# Patient Record
Sex: Male | Born: 1945 | Race: White | Hispanic: No | Marital: Married
Health system: Midwestern US, Community
[De-identification: ages and names within clinical notes are randomized; demographics above are authoritative.]

## PROBLEM LIST (undated history)

## (undated) DIAGNOSIS — R1031 Right lower quadrant pain: Secondary | ICD-10-CM

## (undated) DIAGNOSIS — Z8507 Personal history of malignant neoplasm of pancreas: Secondary | ICD-10-CM

## (undated) DIAGNOSIS — E291 Testicular hypofunction: Secondary | ICD-10-CM

## (undated) DIAGNOSIS — D120 Benign neoplasm of cecum: Secondary | ICD-10-CM

## (undated) DIAGNOSIS — J9601 Acute respiratory failure with hypoxia: Secondary | ICD-10-CM

## (undated) DIAGNOSIS — N201 Calculus of ureter: Secondary | ICD-10-CM

## (undated) DIAGNOSIS — N200 Calculus of kidney: Secondary | ICD-10-CM

## (undated) DIAGNOSIS — R7989 Other specified abnormal findings of blood chemistry: Secondary | ICD-10-CM

## (undated) DIAGNOSIS — N390 Urinary tract infection, site not specified: Secondary | ICD-10-CM

## (undated) DIAGNOSIS — R300 Dysuria: Secondary | ICD-10-CM

## (undated) DIAGNOSIS — R600 Localized edema: Secondary | ICD-10-CM

## (undated) DIAGNOSIS — M47812 Spondylosis without myelopathy or radiculopathy, cervical region: Secondary | ICD-10-CM

## (undated) DIAGNOSIS — R509 Fever, unspecified: Secondary | ICD-10-CM

## (undated) DIAGNOSIS — M5090 Cervical disc disorder, unspecified, unspecified cervical region: Secondary | ICD-10-CM

## (undated) DIAGNOSIS — R002 Palpitations: Secondary | ICD-10-CM

## (undated) DIAGNOSIS — C259 Malignant neoplasm of pancreas, unspecified: Secondary | ICD-10-CM

## (undated) DIAGNOSIS — E782 Mixed hyperlipidemia: Secondary | ICD-10-CM

## (undated) DIAGNOSIS — I2510 Atherosclerotic heart disease of native coronary artery without angina pectoris: Secondary | ICD-10-CM

## (undated) DIAGNOSIS — G629 Polyneuropathy, unspecified: Secondary | ICD-10-CM

## (undated) DIAGNOSIS — I739 Peripheral vascular disease, unspecified: Secondary | ICD-10-CM

## (undated) DIAGNOSIS — I10 Essential (primary) hypertension: Secondary | ICD-10-CM

## (undated) DIAGNOSIS — I779 Disorder of arteries and arterioles, unspecified: Secondary | ICD-10-CM

## (undated) DIAGNOSIS — M1990 Unspecified osteoarthritis, unspecified site: Secondary | ICD-10-CM

## (undated) DIAGNOSIS — J384 Edema of larynx: Secondary | ICD-10-CM

## (undated) DIAGNOSIS — M542 Cervicalgia: Secondary | ICD-10-CM

## (undated) DIAGNOSIS — K429 Umbilical hernia without obstruction or gangrene: Secondary | ICD-10-CM

## (undated) DIAGNOSIS — K7689 Other specified diseases of liver: Secondary | ICD-10-CM

## (undated) DIAGNOSIS — M79606 Pain in leg, unspecified: Secondary | ICD-10-CM

## (undated) DIAGNOSIS — C257 Malignant neoplasm of other parts of pancreas: Secondary | ICD-10-CM

## (undated) DIAGNOSIS — R109 Unspecified abdominal pain: Secondary | ICD-10-CM

## (undated) DIAGNOSIS — Z1231 Encounter for screening mammogram for malignant neoplasm of breast: Secondary | ICD-10-CM

## (undated) HISTORY — PX: BACK SURGERY: SHX140

## (undated) HISTORY — DX: Peripheral vascular disease, unspecified: I73.9

## (undated) HISTORY — DX: Malignant neoplasm of pancreas, unspecified: C25.9

## (undated) HISTORY — DX: Cervical disc disorder, unspecified, unspecified cervical region: M50.90

## (undated) HISTORY — PX: CERVICAL DISC SURGERY: SHX588

## (undated) HISTORY — DX: Palpitations: R00.2

## (undated) HISTORY — DX: Mixed hyperlipidemia: E78.2

## (undated) HISTORY — DX: Atherosclerotic heart disease of native coronary artery without angina pectoris: I25.10

## (undated) HISTORY — DX: Essential (primary) hypertension: I10

## (undated) HISTORY — DX: Disorder of arteries and arterioles, unspecified: I77.9

## (undated) HISTORY — DX: Unspecified osteoarthritis, unspecified site: M19.90

## (undated) HISTORY — DX: Polyneuropathy, unspecified: G62.9

---

## 1993-02-25 HISTORY — PX: CORONARY ARTERY BYPASS GRAFT: SHX141

## 1998-01-11 ENCOUNTER — Inpatient Hospital Stay (HOSPITAL_COMMUNITY): Admission: AD | Admit: 1998-01-11 | Discharge: 1998-01-11 | Payer: Self-pay | Admitting: Cardiology

## 1999-06-21 ENCOUNTER — Encounter: Payer: Self-pay | Admitting: *Deleted

## 1999-06-21 ENCOUNTER — Ambulatory Visit (HOSPITAL_COMMUNITY): Admission: RE | Admit: 1999-06-21 | Discharge: 1999-06-21 | Payer: Self-pay | Admitting: *Deleted

## 2001-03-13 ENCOUNTER — Ambulatory Visit (HOSPITAL_COMMUNITY): Admission: RE | Admit: 2001-03-13 | Discharge: 2001-03-13 | Payer: Self-pay | Admitting: Neurosurgery

## 2003-07-20 ENCOUNTER — Ambulatory Visit (HOSPITAL_COMMUNITY): Admission: RE | Admit: 2003-07-20 | Discharge: 2003-07-20 | Payer: Self-pay | Admitting: Neurosurgery

## 2004-01-13 ENCOUNTER — Ambulatory Visit: Payer: Self-pay | Admitting: Cardiology

## 2004-01-16 ENCOUNTER — Ambulatory Visit: Payer: Self-pay | Admitting: Cardiology

## 2004-01-23 ENCOUNTER — Ambulatory Visit: Payer: Self-pay | Admitting: Cardiology

## 2004-03-05 ENCOUNTER — Inpatient Hospital Stay (HOSPITAL_COMMUNITY): Admission: RE | Admit: 2004-03-05 | Discharge: 2004-03-14 | Payer: Self-pay | Admitting: Neurosurgery

## 2004-03-05 ENCOUNTER — Ambulatory Visit: Payer: Self-pay | Admitting: Internal Medicine

## 2004-08-31 ENCOUNTER — Inpatient Hospital Stay (HOSPITAL_COMMUNITY): Admission: AD | Admit: 2004-08-31 | Discharge: 2004-09-03 | Payer: Self-pay | Admitting: Cardiology

## 2004-08-31 ENCOUNTER — Encounter: Payer: Self-pay | Admitting: Cardiology

## 2004-08-31 ENCOUNTER — Ambulatory Visit: Payer: Self-pay | Admitting: Internal Medicine

## 2004-09-01 ENCOUNTER — Ambulatory Visit: Payer: Self-pay | Admitting: Cardiology

## 2004-09-03 ENCOUNTER — Encounter: Payer: Self-pay | Admitting: Cardiology

## 2004-09-13 ENCOUNTER — Ambulatory Visit: Payer: Self-pay | Admitting: Cardiology

## 2004-11-15 ENCOUNTER — Ambulatory Visit: Payer: Self-pay | Admitting: Cardiology

## 2005-05-20 ENCOUNTER — Ambulatory Visit: Payer: Self-pay | Admitting: Cardiology

## 2005-12-10 ENCOUNTER — Encounter: Payer: Self-pay | Admitting: Cardiology

## 2006-01-31 ENCOUNTER — Ambulatory Visit: Payer: Self-pay | Admitting: Cardiology

## 2006-03-14 ENCOUNTER — Encounter: Payer: Self-pay | Admitting: Cardiology

## 2007-07-14 ENCOUNTER — Encounter: Payer: Self-pay | Admitting: Cardiology

## 2007-07-23 ENCOUNTER — Ambulatory Visit (HOSPITAL_COMMUNITY): Admission: RE | Admit: 2007-07-23 | Discharge: 2007-07-23 | Payer: Self-pay | Admitting: Ophthalmology

## 2008-02-08 ENCOUNTER — Encounter: Payer: Self-pay | Admitting: Cardiology

## 2008-07-29 ENCOUNTER — Ambulatory Visit (HOSPITAL_COMMUNITY): Admission: RE | Admit: 2008-07-29 | Discharge: 2008-07-29 | Payer: Self-pay | Admitting: Neurosurgery

## 2008-08-12 ENCOUNTER — Telehealth: Payer: Self-pay | Admitting: Cardiology

## 2008-08-19 ENCOUNTER — Ambulatory Visit: Payer: Self-pay | Admitting: Cardiology

## 2008-08-22 ENCOUNTER — Encounter: Payer: Self-pay | Admitting: Physician Assistant

## 2008-08-22 ENCOUNTER — Encounter: Payer: Self-pay | Admitting: Cardiology

## 2008-08-22 ENCOUNTER — Ambulatory Visit: Payer: Self-pay | Admitting: Cardiology

## 2008-10-13 ENCOUNTER — Encounter: Payer: Self-pay | Admitting: Physician Assistant

## 2008-10-13 ENCOUNTER — Ambulatory Visit: Payer: Self-pay | Admitting: Cardiology

## 2008-11-08 ENCOUNTER — Encounter: Payer: Self-pay | Admitting: Cardiology

## 2008-11-24 ENCOUNTER — Encounter: Payer: Self-pay | Admitting: Cardiology

## 2008-11-25 ENCOUNTER — Ambulatory Visit: Payer: Self-pay | Admitting: Cardiology

## 2009-03-21 ENCOUNTER — Ambulatory Visit (HOSPITAL_COMMUNITY): Admission: RE | Admit: 2009-03-21 | Discharge: 2009-03-21 | Payer: Self-pay | Admitting: Neurosurgery

## 2009-03-30 ENCOUNTER — Ambulatory Visit (HOSPITAL_COMMUNITY): Admission: RE | Admit: 2009-03-30 | Discharge: 2009-03-30 | Payer: Self-pay | Admitting: Neurosurgery

## 2009-04-24 ENCOUNTER — Telehealth (INDEPENDENT_AMBULATORY_CARE_PROVIDER_SITE_OTHER): Payer: Self-pay | Admitting: *Deleted

## 2009-04-27 ENCOUNTER — Telehealth (INDEPENDENT_AMBULATORY_CARE_PROVIDER_SITE_OTHER): Payer: Self-pay | Admitting: *Deleted

## 2009-05-08 ENCOUNTER — Ambulatory Visit: Payer: Self-pay | Admitting: Cardiology

## 2009-06-08 ENCOUNTER — Encounter: Payer: Self-pay | Admitting: Cardiology

## 2009-07-06 ENCOUNTER — Encounter: Payer: Self-pay | Admitting: Cardiology

## 2009-07-12 ENCOUNTER — Encounter: Payer: Self-pay | Admitting: Cardiology

## 2009-07-12 ENCOUNTER — Inpatient Hospital Stay (HOSPITAL_COMMUNITY): Admission: RE | Admit: 2009-07-12 | Discharge: 2009-07-13 | Payer: Self-pay | Admitting: Neurosurgery

## 2009-07-13 ENCOUNTER — Encounter: Payer: Self-pay | Admitting: Cardiology

## 2009-07-27 ENCOUNTER — Ambulatory Visit: Payer: Self-pay | Admitting: Cardiology

## 2009-12-12 ENCOUNTER — Telehealth (INDEPENDENT_AMBULATORY_CARE_PROVIDER_SITE_OTHER): Payer: Self-pay | Admitting: *Deleted

## 2010-03-18 ENCOUNTER — Encounter: Payer: Self-pay | Admitting: Neurosurgery

## 2010-03-27 NOTE — Op Note (Signed)
Summary: Operative Report  Operative Report   Imported By: Dorise Hiss 07/27/2009 12:21:25  _____________________________________________________________________  External Attachment:    Type:   Image     Comment:   External Document

## 2010-03-27 NOTE — Assessment & Plan Note (Signed)
Summary: PT REQUEST APPT-BURNING IN CHEST   Visit Type:  chest pain Primary Provider:  Regina Eck, MD  CC:  chest pain.  History of Present Illness:  The patient is an extremely pleasant  gentleman with a past  medical history of coronary disease, hypertension, hyperlipidemia who presents for evaluation of chest pain. I saw him last in June 2010 for a preoperative evaluation. The  patient's cardiac history dates back to 1995, when he underwent coronary   artery bypassing graft.  He did undergo a repeat catheterization in July  2006.  At that time, he was found to have severe native 3-vessel  coronary disease.  However, all of his grafts were patent.  When we saw him in June of 2010 we did schedule a Myoview. This was performed on June 28 of 2010. Ejection fraction was 52%. There was a question of very mild ischemia in the inferior basal wall. He was treated medically. He also had carotid Dopplers performed at that time that showed less than 50% bilateral stenosis. He did see one of our physician's assistants secondary to atypical chest pain and Imdur was added. He now returns with more complaints of chest pain. The patient states he has had these pains intermittently for 6 months. They do not radiate. They are described as a fire-like sensation in the left chest area. They are unpredictable and last one to 2 minutes and resolve spontaneously. There is no associated nausea, shortness of breath or diaphoresis. It is nonexertional, pleuritic, positional or related to food. It is unlike his previous cardiac pain. Note he has no exertional chest pain. He does have some dyspnea on exertion with more extreme activities which is a chronic issue.  Preventive Screening-Counseling & Management  Alcohol-Tobacco     Smoking Status: never  Current Medications (verified): 1)  Plavix 75 Mg Tabs (Clopidogrel Bisulfate) .... Take One Tablet By Mouth Daily 2)  Amitriptyline Hcl 25 Mg Tabs (Amitriptyline Hcl)  .... Take 2 Tab By Mouth At Bedtime 3)  Neurontin 600 Mg Tabs (Gabapentin) .... 4 Times A Day 4)  Lotrel 10-20 Mg Caps (Amlodipine Besy-Benazepril Hcl) .... Take 1 Capsule By Mouth Once A Day 5)  Lipitor 10 Mg Tabs (Atorvastatin Calcium) .... Take One Tablet By Mouth Daily. 6)  Aspir-Trin 325 Mg Tbec (Aspirin) .... Take 1 Tablet By Mouth Once A Day 7)  Vitamin D3 400 Unit Tabs (Cholecalciferol) .... Once Daily 8)  Isosorbide Mononitrate Cr 30 Mg Xr24h-Tab (Isosorbide Mononitrate) .... Take One Tablet By Mouth Daily 9)  Nitroglycerin 0.4 Mg Subl (Nitroglycerin) .... One Tablet Under Tongue Every 5 Minutes As Needed For Chest Pain---May Repeat Times Three 10)  Allegra 180 Mg Tabs (Fexofenadine Hcl) .... Take 1 Tablet By Mouth Once A Day 11)  Metoprolol Tartrate 25 Mg Tabs (Metoprolol Tartrate) .... Take 1 Tablet By Mouth Once A Day 12)  Meloxicam 15 Mg Tabs (Meloxicam) .... Take 1 Tablet By Mouth Once A Day  Allergies (verified): 1)  ! Demerol 2)  ! Compazine 3)  ! Reglan 4)  ! * Vistaid 5)  ! Codeine 6)  ! * Anesthia  Comments:  Nurse/Medical Assistant: The patient's medications and allergies were reviewed with the patient and were updated in the Medication and Allergy Lists. List reviewed.  Past History:  Past Medical History: HYPERTENSION, UNSPECIFIED (ICD-401.9) HYPERLIPIDEMIA-MIXED (ICD-272.4) carotid artery disease... Doppler..June.. 2010.Marland Kitchen less than 50% bilateral ICA stenosis  CABG IN 1995 [facility]) CAD...catheterization July, 2006 patent grafts possible ischemia distal circumflex not  amenable to PCI / ..nuclear study.Marland Kitchen.June.. 2010.Marland Kitchen possible mild inferobasal ischemia... low risk.Marland Kitchen / EF 52%...nuclear scan..June..  2010.  Past Surgical History: Reviewed history from 10/11/2008 and no changes required. Back Surgery CABG  Social History: Reviewed history from 10/11/2008 and no changes required. Married  Tobacco Use - No.  Alcohol Use - no  Review of Systems       Some  problems with neck pain from a disc issue but no fevers or chills, productive cough, hemoptysis, dysphasia, odynophagia, melena, hematochezia, dysuria, hematuria, rash, seizure activity, orthopnea, PND, pedal edema, claudication. Remaining systems are negative.   Vital Signs:  Patient profile:   65 year old male Height:      68 inches Weight:      175 pounds Pulse rate:   82 / minute BP sitting:   123 / 71  (left arm) Cuff size:   regular  Vitals Entered By: Carlye Grippe (May 08, 2009 10:37 AM) CC: chest pain   Physical Exam  General:  Well-developed well-nourished in no acute distress.  Skin is warm and dry.  HEENT is normal.  Neck is supple. No thyromegaly.  Chest is clear to auscultation with normal expansion.  Cardiovascular exam is regular rate and rhythm.  Abdominal exam nontender or distended. No masses palpated. Extremities show no edema. neuro grossly intact    EKG  Procedure date:  05/08/2009  Findings:      Normal sinus rhythm at a rate of 75. Left axis deviation. No significant ST changes.  Impression & Recommendations:  Problem # 1:  CHEST PAIN (ICD-786.50) His symptoms are atypical and his electrocardiogram shows no ST changes. They do not sound like cardiac pain. His recent Myoview was low risk. I do not think we need to pursue cardiac catheterization at this point. We will continue with medical therapy. I wonder if there may be a GI component. However it is not classic for this either. I have asked him to take either Pepcid or Zantac over-the-counter to see if this improves his symptoms. We will see him back in 3 months to review. His updated medication list for this problem includes:    Plavix 75 Mg Tabs (Clopidogrel bisulfate) .Marland Kitchen... Take one tablet by mouth daily    Lotrel 10-20 Mg Caps (Amlodipine besy-benazepril hcl) .Marland Kitchen... Take 1 capsule by mouth once a day    Aspir-trin 325 Mg Tbec (Aspirin) .Marland Kitchen... Take 1 tablet by mouth once a day    Isosorbide  Mononitrate Cr 30 Mg Xr24h-tab (Isosorbide mononitrate) .Marland Kitchen... Take one tablet by mouth daily    Nitroglycerin 0.4 Mg Subl (Nitroglycerin) ..... One tablet under tongue every 5 minutes as needed for chest pain---may repeat times three    Metoprolol Tartrate 25 Mg Tabs (Metoprolol tartrate) .Marland Kitchen... Take 1 tablet by mouth once a day  Problem # 2:  CORONARY ARTERY BYPASS GRAFT, HX OF (ICD-V45.81) Continue aspirin, beta blocker, statin. Orders: EKG w/ Interpretation (93000)  Problem # 3:  CAROTID ARTERY DISEASE (ICD-433.10) Recent carotid Dopplers showed less than 50% bilateral stenosis. Continue aspirin and statin. His updated medication list for this problem includes:    Plavix 75 Mg Tabs (Clopidogrel bisulfate) .Marland Kitchen... Take one tablet by mouth daily    Aspir-trin 325 Mg Tbec (Aspirin) .Marland Kitchen... Take 1 tablet by mouth once a day  Problem # 4:  HYPERTENSION, UNSPECIFIED (ICD-401.9) Blood pressure controlled on present medications. We'll continue. His updated medication list for this problem includes:    Lotrel 10-20 Mg Caps (Amlodipine besy-benazepril  hcl) ..... Take 1 capsule by mouth once a day    Aspir-trin 325 Mg Tbec (Aspirin) .Marland Kitchen... Take 1 tablet by mouth once a day    Metoprolol Tartrate 25 Mg Tabs (Metoprolol tartrate) .Marland Kitchen... Take 1 tablet by mouth once a day  Problem # 5:  HYPERLIPIDEMIA-MIXED (ICD-272.4) Continue statin. Lipids and liver monitored by his primary care. His updated medication list for this problem includes:    Lipitor 10 Mg Tabs (Atorvastatin calcium) .Marland Kitchen... Take one tablet by mouth daily.  Patient Instructions: 1)  Your physician wants you to follow-up in: 3 months. You will receive a reminder letter in the mail one-two months in advance. If you don't receive a letter, please call our office to schedule the follow-up appointment. 2)  Your physician recommends that you continue on your current medications as directed. Please refer to the Current Medication list given to you  today.

## 2010-03-27 NOTE — Progress Notes (Signed)
Summary: want appt for chest discomfort  Phone Note Call from Patient Call back at [phone]   Caller: Patient Call For: NURSE Summary of Call: SAW DR. Linna Darner ON TUESDAY WAS INFORMED THAT EKG NORMAL,THAT HE HAD A MUSCLE RELATED PROBLEM. PATIENT CALLED WANTED TO SEE KATZ FOR ANOTHER OPINION. Patient given first available appt March 14th @10 :15 with Dr. Jens Som. Nurse informed patient to go to ED if symptoms get worse. Patient verbalized understanding of plan.   Initial call taken by: Carlye Grippe,  April 27, 2009 4:26 PM

## 2010-03-27 NOTE — Letter (Signed)
Summary: Letter/ FAXED [facility]  Letter/ FAXED [facility]   Imported By: Dorise Hiss 06/19/2009 10:46:25  _____________________________________________________________________  External Attachment:    Type:   Image     Comment:   External Document

## 2010-03-27 NOTE — Progress Notes (Signed)
Summary: Burning chest pain  Phone Note Call from Patient Call back at [phone]   Caller: Patient Summary of Call: Pt called the office stating he is having a burning sensation aruond his heart. He states it feels like someone is holding a match to his chest near his sternum. He states he has been feeling this for about 5 weeks now. He sates this happens several times per day and is more frequent now than in the past. He states he saw Dr. Linna Darner this am. He states Dr. Linna Darner felt this is a muscle but he's not sure. He states this pain can occur with rest or exertion. When he was seen by Gene in October, the patient mention a pain that felt like being stuck with a hot poker. He states this pain is similar to the pain he felt then. He states he is taking meds as instructed. He is aware to go to ER for worsening pain or further concerns before we are able to return his call. We will discuss with MD and notify pt of his suggestions. Initial call taken by: Cyril Loosen, RN, BSN,  April 24, 2009 5:14 PM

## 2010-03-27 NOTE — Progress Notes (Signed)
Summary: refill request  Phone Note Call from Patient Call back at Home Phone [phone] Call back at [phone]   Caller: patie walk in Reason for Call: Refill Medication Summary of Call: metrapolol - patient came in [facility] in [place] has not received refill request.   Initial call taken by: Claudette Laws,  December 12, 2009 1:41 PM  Follow-up for Phone Call        called [facility] and verified rx received and staff stated rx is ready for pick up. patient informed. Follow-up by: Carlye Grippe,  December 12, 2009 2:29 PM

## 2010-03-27 NOTE — Letter (Signed)
Summary: Appointment- Rescheduled  [facility] [address]   [place], [place] [address]   Phone: [phone]  Fax: [phone]     Jul 13, 2009 MRN: [id number]     [facility] [address] [place], [place]  [address]     Dear Brett Mercado,   Due to a change in our office schedule, your appointment on  August 02, 2009 at 9:30 am  must be changed.    Your new appointment will be July 27, 2009 at 2:00pm  We look forward to participating in your health care needs.     Sincerely,  Glass blower/designer

## 2010-03-27 NOTE — Assessment & Plan Note (Signed)
Summary: 6 month fu -recv reminder   Visit Type:  Follow-up Primary Provider:  Regina Eck, MD  CC:  CAD.  History of Present Illness: The patient is seen for followup of coronary disease.  He has been quite stable.  He underwent cervical this surgery and has done very well.  He's not had any significant chest pain.  He had some mild difficulties in March, 2011.  He was seen in our office by Dr.Crenshaw who felt he was stable.  He's had no difficulty since.  Preventive Screening-Counseling & Management  Alcohol-Tobacco     Smoking Status: never  Current Medications (verified): 1)  Plavix 75 Mg Tabs (Clopidogrel Bisulfate) .... Take One Tablet By Mouth Daily 2)  Amitriptyline Hcl 25 Mg Tabs (Amitriptyline Hcl) .... Take 2 Tab By Mouth At Bedtime 3)  Neurontin 600 Mg Tabs (Gabapentin) .... 4 Times A Day 4)  Lotrel 10-20 Mg Caps (Amlodipine Besy-Benazepril Hcl) .... Take 1 Capsule By Mouth Once A Day 5)  Lipitor 10 Mg Tabs (Atorvastatin Calcium) .... Take One Tablet By Mouth Daily. 6)  Aspir-Trin 325 Mg Tbec (Aspirin) .... Take 1 Tablet By Mouth Once A Day 7)  Vitamin D3 400 Unit Tabs (Cholecalciferol) .... Once Daily 8)  Isosorbide Mononitrate Cr 30 Mg Xr24h-Tab (Isosorbide Mononitrate) .... Take One Tablet By Mouth Daily 9)  Nitroglycerin 0.4 Mg Subl (Nitroglycerin) .... One Tablet Under Tongue Every 5 Minutes As Needed For Chest Pain---May Repeat Times Three 10)  Allegra 180 Mg Tabs (Fexofenadine Hcl) .... Take 1 Tablet By Mouth Once A Day 11)  Metoprolol Tartrate 25 Mg Tabs (Metoprolol Tartrate) .... Take 1 Tablet By Mouth Once A Day 12)  Meloxicam 15 Mg Tabs (Meloxicam) .... Take 1 Tablet By Mouth Once A Day  Allergies (verified): 1)  ! Demerol 2)  ! Compazine 3)  ! Reglan 4)  ! * Vistaid 5)  ! Codeine 6)  ! * Anesthia  Comments:  Nurse/Medical Assistant: The patient is currently on medications but does not know the name or dosage at this time. Instructed to contact our  office with details. Will update medication list at that time.  Past History:  Past Medical History: Last updated: 05/08/2009 HYPERTENSION, UNSPECIFIED (ICD-401.9) HYPERLIPIDEMIA-MIXED (ICD-272.4) carotid artery disease... Doppler..June.. 2010.Marland Kitchen less than 50% bilateral ICA stenosis  CABG IN 1995 [facility]) CAD...catheterization July, 2006 patent grafts possible ischemia distal circumflex not amenable to PCI / ..nuclear study.Marland Kitchen.June.. 2010.Marland Kitchen possible mild inferobasal ischemia... low risk.Marland Kitchen / EF 52%...nuclear scan..June..  2010.  Review of Systems       Patient denies fever, chills, headache, sweats, rash, change in vision, change in hearing, chest pain, cough, nausea vomiting, urinary symptoms.  All other systems are reviewed and are negative and  Vital Signs:  Patient profile:   65 year old male Height:      68 inches Weight:      171 pounds BMI:     26.09 Pulse rate:   73 / minute BP sitting:   147 / 93  (left arm) Cuff size:   regular  Vitals Entered By: Carlye Grippe (July 27, 2009 2:03 PM)  Nutrition Counseling: Patient's BMI is greater than 25 and therefore counseled on weight management options.  Physical Exam  General:  patient is stable. Eyes:  no xanthelasma. Neck:  he is wearing a neck brace and is stable. Lungs:  lungs are clear respiratory effort is not labored. Heart:  cardiac exam reveals S1-S2.  No clicks or significant murmurs. Abdomen:  abdomen is soft. Extremities:  no peripheral edema. Psych:  patient is oriented to person time and place.  Affect is normal.   Impression & Recommendations:  Problem # 1:  CAROTID ARTERY DISEASE (ICD-433.10)  His updated medication list for this problem includes:    Plavix 75 Mg Tabs (Clopidogrel bisulfate) .Marland Kitchen... Take one tablet by mouth daily    Aspir-trin 325 Mg Tbec (Aspirin) .Marland Kitchen... Take 1 tablet by mouth once a day Carotid disease is mild.  No further workup now.  Problem # 2:  CAD, NATIVE VESSEL (ICD-414.01)  His  updated medication list for this problem includes:    Plavix 75 Mg Tabs (Clopidogrel bisulfate) .Marland Kitchen... Take one tablet by mouth daily    Lotrel 10-20 Mg Caps (Amlodipine besy-benazepril hcl) .Marland Kitchen... Take 1 capsule by mouth once a day    Aspir-trin 325 Mg Tbec (Aspirin) .Marland Kitchen... Take 1 tablet by mouth once a day    Isosorbide Mononitrate Cr 30 Mg Xr24h-tab (Isosorbide mononitrate) .Marland Kitchen... Take one tablet by mouth daily    Nitroglycerin 0.4 Mg Subl (Nitroglycerin) ..... One tablet under tongue every 5 minutes as needed for chest pain---may repeat times three    Metoprolol Tartrate 25 Mg Tabs (Metoprolol tartrate) .Marland Kitchen... Take 1 tablet by mouth once a day Coronary disease is stable.  No change in therapy.  Problem # 3:  HYPERTENSION, UNSPECIFIED (ICD-401.9)  His updated medication list for this problem includes:    Lotrel 10-20 Mg Caps (Amlodipine besy-benazepril hcl) .Marland Kitchen... Take 1 capsule by mouth once a day    Aspir-trin 325 Mg Tbec (Aspirin) .Marland Kitchen... Take 1 tablet by mouth once a day    Metoprolol Tartrate 25 Mg Tabs (Metoprolol tartrate) .Marland Kitchen... Take 1 tablet by mouth once a day Blood pressure slightly elevated today.  He'll need followup blood pressure checks.  Patient Instructions: 1)  Your physician wants you to follow-up in: 1 year. You will receive a reminder letter in the mail one-two months in advance. If you don't receive a letter, please call our office to schedule the follow-up appointment. 2)  Your physician recommends that you continue on your current medications as directed. Please refer to the Current Medication list given to you today. Prescriptions: ISOSORBIDE MONONITRATE CR 30 MG XR24H-TAB (ISOSORBIDE MONONITRATE) Take one tablet by mouth daily  #90 x 3   Entered by:   Cyril Loosen, RN, BSN   Authorized by:   Talitha Givens, MD, [facility]   Signed by:   Cyril Loosen, RN, BSN on 07/27/2009   Method used:   Electronically to        [facility] (retail)       [address]       [place], [place]  [address]       Ph: [phone]       Fax: [phone]   RxID:   [id number]

## 2010-05-14 LAB — PROTIME-INR: INR: 1.07 (ref 0.00–1.49)

## 2010-05-14 LAB — APTT: aPTT: 34 seconds (ref 24–37)

## 2010-05-15 LAB — SURGICAL PCR SCREEN: Staphylococcus aureus: NEGATIVE

## 2010-05-15 LAB — CBC
HCT: 33.8 % — ABNORMAL LOW (ref 39.0–52.0)
Hemoglobin: 11.4 g/dL — ABNORMAL LOW (ref 13.0–17.0)
MCV: 77.2 fL — ABNORMAL LOW (ref 78.0–100.0)
RBC: 4.38 MIL/uL (ref 4.22–5.81)
WBC: 6.7 10*3/uL (ref 4.0–10.5)

## 2010-05-15 LAB — BASIC METABOLIC PANEL
GFR calc Af Amer: >60 mL/min (ref >60)
GFR calc non Af Amer: >60 mL/min (ref >60)
Potassium: 5.1 mEq/L (ref 3.5–5.1)
Sodium: 138 mEq/L (ref 135–145)

## 2010-06-04 DIAGNOSIS — I2510 Atherosclerotic heart disease of native coronary artery without angina pectoris: Secondary | ICD-10-CM

## 2010-07-10 NOTE — Assessment & Plan Note (Signed)
[facility]                          [facility] OFFICE NOTE   Brett Mercado, Brett Mercado                      MRN:          [id number]  DATE:08/19/2008                            DOB:          05/18/1945    The patient is an extremely pleasant 65 year old gentleman with a past  medical history of coronary disease, hypertension, hyperlipidemia, who I  am asked to evaluate preoperatively prior to knee surgery.  The  patient's cardiac history dates back to 1995, when he underwent coronary  artery bypassing graft.  He did undergo a repeat catheterization in July  2006.  At that time, he was found to have severe native 3-vessel  coronary disease.  However, all of his grafts were patent.  He has not  been seen in this office since December 2007.   NOTE:  He does not have dyspnea on exertion unless this with extreme  activities.  This is relieved with rest.  There is no associated chest  pain.  There is no orthopnea, PND, or pedal edema.  He walks typically 3  miles per day without chest pain.  He has had problems with his left  knee which will require arthroscopic surgery.  Cardiology was asked to  evaluate preoperatively.   His medications include:  1. Plavix 75 mg p.o. daily.  2. Amitriptyline 25 mg p.o. daily.  3. Neurontin 600 mg p.o. q.i.d.  4. Lotrel 5/20 mg tablets p.o. daily.  5. Celebrex 200 mg p.o. daily.  6. Lipitor 10 mg p.o. daily.  7. Aspirin 325 mg p.o. daily.  8. Vitamin D3.   He has allergies to PERCOCET, DEMEROL, COMPAZINE, REGLAN, VISTARIL,  CODEINE.   His social history, he does not smoke.   His past medical history is significant for coronary disease,  hypertension, and hyperlipidemia.  He has had previous back and cervical  surgery.  There is no other past medical history noted.   His review of systems, there is no headaches or fevers or chills.  There  is no productive cough or hemoptysis.  There is no dysphagia,  odynophagia, melena, hematochezia.  There is no dysuria or hematuria.  There is no rash or seizure activity.  There is no orthopnea, PND, or  pedal edema.  There is no claudication noted.  The remaining systems are  negative other than pain in his left knee with ambulation.   PHYSICAL EXAMINATION:  VITAL SIGNS:  Today shows a blood pressure of  144/79, his pulse is 96.  He weighs 171 pounds.  GENERAL:  He is well-developed, well-nourished, in no acute distress.  SKIN:  Warm and dry.  BREASTS:  There is no peripheral clubbing.  BACK:  Normal.  HEENT:  Normal with normal eyelids.  NECK:  Supple with a normal upstroke bilaterally.  There is a right  carotid bruit noted.  There is no thyromegaly noted.  CHEST:  Clear to auscultation.  No expansion.  CARDIOVASCULAR:  Regular rate and rhythm with normal S1 and S2.  There  are no murmurs, rubs, or gallops noted.  ABDOMEN:  Non tenderness, nondistended.  Positive  bowel sounds.  No  hepatosplenomegaly and no masses appreciated.  There is no abdominal  bruit.  EXTREMITIES:  He has 2+ femoral pulses bilaterally.  No bruits.  Extremities show no edema.  I could palpate no cords.  He does have a  small effusion in the left knee.  NEUROLOGIC:  Grossly intact.   His electrocardiogram shows a sinus rhythm at a rate of 96.  There is no  RV conduction delay.  There are no ST changes noted.   DIAGNOSES:  1. Preoperative evaluation prior to knee surgery - the patient is      doing well symptomatically.  He ambulates up to 3 miles per day      without having chest pain.  His procedure is low risk.  I think he      can proceed safely with surgery.  Given that it has been 4 years      since his previous catheterization, we will arrange a Myoview for      risk stratification.  We will also ask him to hold his Plavix and      aspirin prior to his procedure and he will resume that afterwards.  2. Right carotid bruit - we will schedule carotid Dopplers to  exclude      for significant cerebrovascular disease.  3. Coronary artery disease - he will continue on his statin, and ACE      inhibitor.  We will resume his aspirin and Plavix following      surgery.  Note, he does not smoke.  4. Hypertension - his blood pressure is mildly elevated.  We will      track this and increase his Lotrel as needed.  5. Hyperlipidemia - he will continue on his statin and this is being      monitored by Dr. Linna Darner.   I will have him return to this office in approximately 8 weeks to see  either Gene Serpe or Dr. Myrtis Ser to review his above studies.     Madolyn Frieze Jens Som, MD, [facility]  Electronically Signed    BSC/MedQ  DD: 08/19/2008  DT: 08/19/2008  Job #: [id number]   cc:   Erasmo Downer, MD

## 2010-07-13 NOTE — Cardiovascular Report (Signed)
NAMERUDY, LUHMANN NO.:  [id number]   MEDICAL RECORD NO.:  [id number]          PATIENT TYPE:  INP   LOCATION:  3713                         FACILITY:  [facility]   PHYSICIAN:  Learta Codding, M.D. LHCDATE OF BIRTH:  [birth year]   DATE OF PROCEDURE:  09/03/2004  DATE OF DISCHARGE:                              CARDIAC CATHETERIZATION   REQUESTING PHYSICIAN:  Dr. Linna Darner in [place].   CARDIOLOGIST:  Flint Melter, MD   PROCEDURE PERFORMED:  1.  Left heart catheterization with selective coronary angiography.  2.  Graft injection.  3.  Ventriculography.   DIAGNOSES:  1.  Patent graft including LIMA to the LAD, free RIMA to the ramus      intermedius, saphenous vein graft to the OM2 and saphenous vein graft to      the PDA.  2.  Severe native vessel coronary artery disease.  3.  Normal left ventricular systolic function.   INDICATIONS:  The patient is a 65 year old male with a history of prior  coronary artery bypass grafting in 1995 performed at [facility].  The patient had a catheterization in 2001 to assess the graft  anatomy.  No graft stenosis was noted; and the patient was treated  medically.  The patient now presents with palpitations and atypical chest  pain.  The patient describes pain both at rest and on exertion.  He has been  referred for cardiac catheterization to rule out worsening CAD.  In the  interim he has ruled out for myocardial infarction by enzymes.   DESCRIPTION OF PROCEDURE:  After informed consent was obtained. The patient  was brought to the catheterization laboratory.  The right groin was  sterilely prepped and draped.  A 6-French arterial sheath was placed using  the modified Seldinger technique.  A 6-French JL4 and JR4 catheters were  used for coronary injection.  The left internal mammary artery was  cannulated with an IMA catheter. Ventriculography was performed with a  pigtail catheter using power injection of  contrast.   At the termination of the procedure all catheters and sheaths were removed;  and no complications were encountered. Adequate hemostasis was provided.   FINDINGS   I HEMODYNAMICS:  Left ventricular pressure 137/17 mmHg, aortic pressure  137/77 mmHg.  There was no grading across the aortic valve.   II VENTRICULOGRAPHY:  Ejection fraction of 55% with no wall motion  abnormalities.  There was no significant mitral regurgitation.   III. SELECTIVE CORONARY ANGIOGRAPHY:  1.  The left main coronary artery was a large caliber vessel with diffuse      distal stenosis of approximately 50%.  2.  The left anterior descending artery was occluded after first diagonal      branch.  The proximal LAD had diffuse 70% stenosis.  3.  Circumflex coronary artery was occluded in the proximal course after a      first obtuse marginal branch.  4.  The ramus intermedius had a focal 70-80% proximal stenosis.  5.  The right coronary artery demonstrated an 80% proximal stenosis and  approximately 50-60% diffuse stenosis at the bifurcation with an RV      branch.  The distal RCA had a 70-80% stenosis.   GRAFT ANATOMY:  1.  The LIMA to the LAD was free of flow-limiting lesions.  The graft      anastomosis looked within normal limits.  The distal LAD demonstrated no      significant disease.  2.  The free RIMA graft to the ramus intermedius was widely patent.  The      ramus branch itself had no focal stenoses.  3.  The saphenous vein graft to the second obtuse marginal branch was      patent. There was a 70% stenosis in the circumflex proper.  4.  The saphenous vein graft to the right coronary artery, i.e. PTA was      widely patent.  There was retrograde filling of the PDA and competitive      flow seen in the distal RCA.   CONCLUSION:  The patient's graft anatomy is patent.  He has significant  native vessel coronary artery disease, but the only obvious source for  ischemia may be the distal  circumflex which fills retrograde through  marginal branch.  However, this vessel is small and would not be very  amenable to PCI.  Otherwise there appears to be full revascularization of  the coronary system.  The patient does report palpitations and he will be  set up for an event monitor in [place] to rule out possibility of an underlying  atrial fibrillation. He has been started on Lopressor during his  hospitalization and this be continued.  The patient will be continued on  medical therapy for his coronary artery disease.       GED/MEDQ  D:  09/03/2004  T:  09/03/2004  Job:  [id number]   cc:   [facility]

## 2010-07-13 NOTE — Op Note (Signed)
NAMELYRIC, HOAR NO.:  [id number]   MEDICAL RECORD NO.:  [id number]          PATIENT TYPE:  INP   LOCATION:  3007                         FACILITY:  [facility]   PHYSICIAN:  Cristi Loron, M.D.DATE OF BIRTH:  01-Jan-1946   DATE OF PROCEDURE:  03/05/2004  DATE OF DISCHARGE:                                 OPERATIVE REPORT   BRIEF HISTORY:  The patient is a 65 year old white male who has suffered  from back and bilateral leg pain.  He has failed medical management.  He has  a prior history of lumbar surgery in the remote past.  I worked him up with  a lumbar CT.  It demonstrated that the patient had significant stenosis at  L4-5 with degenerative changes, as well as spondylolisthesis.  I discussed  the various treatment options with him, including surgery.  The patient has  weighed the risks, benefits and alternatives to surgery and decided to  proceed with an L4-5 decompression and fusion.   PREOPERATIVE DIAGNOSES:  1.  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  2.  Herniated nucleus pulposus.  3.  Degenerative disk disease.  4.  Lumbago.  5.  Lumbar radiculopathy.   POSTOPERATIVE DIAGNOSES:  1.  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  2.  Herniated nucleus pulposus.  3.  Degenerative disk disease.  4.  Lumbago.  5.  Lumbar radiculopathy.   PROCEDURES:  1.  L4 Gill procedure with bilateral L3 laminotomies.  2.  L4-5 posterior lumbar interbody fusion.  3.  Insertion of bilateral L4-5 interbody prosthesis (Alphatec interbody      PEEK cages), posterior nonsegmental metal instrumentation at L4-5 with      Alphatec pedicle screws and rods, titanium pedicle screws and rods.  4.  Posterolateral arthrodesis at L4-5 with local morcellized autograft      bone.  Tricalcium phosphate bone was substituted.   SURGEON:  Cristi Loron, M.D.   ASSIST:  Coletta Memos, M.D.   ANESTHESIA:  General endotracheal.   ESTIMATED BLOOD LOSS:  200 mL.   SPECIMENS:  None.   DRAINS:  None.   COMPLICATIONS:  None.   DESCRIPTION OF PROCEDURE:  The patient was brought to the operating room by  the anesthesia team.  General endotracheal anesthesia was induced.  The  patient was turned into the prone position on the [place].  His lumbosacral  region was then prepared with Betadine scrub and Betadine solution.  Sterile  drapes were applied.  I then injected the area to be incised with Marcaine  with epinephrine solution.  I used a scalpel to lift out the patient's prior  surgical scar.  I used electrocautery to dissect down to the thoracolumbar  fashion and perform a subperiosteal dissection, exposing the spinous process  of the lamina of L3, L4 and L5.  We obtained intraoperative radiographs to  confirm our location and inserted the first retractor for exposure.  We then  used a high-speed drill to extend the patient's prior L4 laminotomies in the  cephalad direction, going through the surgical scar.  We encountered  some  relatively nonscarred dura and then we completed the L4 Gill procedure by  using Kerrison punch to remove the remainder of the L4 lamina and perform  generous foraminotomy to bilateral L4 and L5 nerve roots.  There was  considerable lateral recess stenosis at this level.  Because there was some  stenosis at L3-4 as well, we used a high-speed drill to perform bilateral L3  laminotomies and used a Kerrison to widen the laminotomies, decompressing  the thecal sac at L3-4 as well.   We then turned our attention to the interspace.  I carefully freed up the  cecal sac at L4-5 and the L5 nerve root and exposed the underlying L4-5  intervertebral disk.  On the left side there was a large disk herniation.  I  incised it and removed in multiple fragments using the pituitary forceps.  We then performed aggressive diskectomy using the Epstein curets and the  Scoville curets.  We then repeated this  procedure on the right side and then  inserted a vertebral vise spreader on the right side, distracting the L4-5  interspace.  We then cleared off all of the soft tissue from the ipsilateral  disk space using the curets and then we inserted a 10 x 25 mm Alphatec PEEK  cage into the distracted interspace.  We then removed the vertebral bodies  spared from the contralateral side, prepared that for vertebral interbody  plating and the patient's second 10 x 26 PEEK cage into the interspace.  I  should mention that the cages were prefilled with local morcellized  autograft bone and tricalcium phosphate bone extender.  We filled it in  between the lateral cages as well in the disk space with the local  morcellized autograft bone and the bone substitute.  This completed the  posterior lumbar interbody fusion.  We then turned our attention to  instrumentation.  We used the electrocautery to expose the transverse  process of L4 and L5.  Then under fluoroscopic guidance we cannulated the  bilateral L4-5 pedicles with the bone probes.  We tapped the pedicle with a  5.5 mm tap and inserted 6.5 x 40 mm pedicle screws bilaterally at L4 and 6.5  x 50 mm screws bilaterally at L5.  We did this under fluoroscopic guidance.  I should mention that we probed inside the tapped pedicles prior to putting  the pedicle screws in to rule out cortical breaches.  After we had put the  pedicle screws in, we palpated along the medial aspect of the L4 and L5  pedicles and noted that there were no cortical breaches and the L4 and L5  nerve roots were not injured.  We connected unilateral pedicle screws with a  lordotic drive.  We decompressed the construct and then we secured the rods  in place with the caps.  We tightened them appropriately, completing the  instrumentation.   We then turned our attention to the posterolateral arthrodesis.  We decided to drill to decorticate the remainder of the L4-5 facet, the L4-5  pars  region, and the transverse process of L4-5.  We then laid a combination of  local morcellized autograft bone and bone substitute over these decorticated  posterolateral structures, completing the posterolateral arthrodesis.  We  then inspected the thecal sac and the bilateral L4-5 nerve roots and noted  that they were well decompressed.  We obtained stringent hemostasis using  bipolar electrocautery.  We copiously irrigated the wound out with  bacitracin solution  and removed the solution.  We then removed the  retractor.  We reapproximated the patient's thoracolumbar fascia with  interrupted 1-0 Vicryl suture, the subcutaneous tissues with interrupted 2-0  Vicryl suture and the skin with Steri-Strips and Benzoin.  The wound was  then coated with bacitracin  ointment.  Sterile dressing was applied.  The drapes were removed.  The  patient was subsequently returned to the supine position where he was  extubated by the anesthesia team and transported to the postanesthesia care  unit in stable condition.  All sponge, needle and instrument counts were  correct were correct at the end of the case.      JDJ/MEDQ  D:  03/05/2004  T:  03/05/2004  Job:  [id number]

## 2010-07-13 NOTE — Assessment & Plan Note (Signed)
[facility]                          [facility] OFF[facility] NOTE   ARAF, CLUGSTON                      MRN:          [id number]  DATE:01/31/2006                            DOB:          1945-06-21    Mr. Brett Mercado is doing well.  He has coronary disease.  He underwent CABG  in 1995.  He was re-cathed in July 2006 and he had patent grafts.  There  was significant native vessel disease and distal circumflex stenosis  that filled retrograde from a diagonal.  It is a small vessel and not  amenable to PCI.  He is not having any significant chest pain or  shortness of breath.  He feels well and he is going about full  activities.   PAST MEDICAL HISTORY:  Allergies:  PERCOCET, DEMEROL, COMPAZINE, REGLAN,  AND CODEINE.  Medications:  Lotrel, Lipitor, Allegra, amitriptyline, Celebrex, Plavix,  aspirin, Neurontin, and Prevacid.  Other medical problems:  See the list below.   REVIEW OF SYSTEMS:  He feels great and has no complaints and his review  of systems is negative.   PHYSICAL EXAMINATION:  GENERAL:  The patient is oriented to person,  time, and place.  His affect is normal.  He is wearing a nice [place]  baseball cap.  VITAL SIGNS:  Blood pressure 140/88 with a pulse of 76.  His weight is  173 pounds.  LUNGS:  Clear.  Respiratory effort is not labored.  HEENT:  No xanthelasma.  He has normal extraocular motion.  There are no  carotid bruits.  There is no jugular venous distention.  CARDIAC:  Exam reveals S1 and S2.  There are no clicks or significant  murmurs.  ABDOMEN:  Soft.  He has no masses or bruits.  EXTREMITIES:  He has no peripheral edema.   PROBLEM LIST:  1. Coronary artery disease as outlined above.  2. Palpitations, stable.  3. History of hypertension and some episodes of orthostasis and this      is stable on his current regimen.  4. Hyperlipidemia being treated.  5. Medicine allergies as outlined.   Mr. Brett Mercado is stable.  I  will see him back in one year.    Luis Abed, MD, [facility]  Electronically Signed   JDK/MedQ  DD: 01/31/2006  DT: 01/31/2006  Job #: [id number]   cc:   Carolynn Comment, M.D.

## 2010-07-13 NOTE — Cardiovascular Report (Signed)
[place]  Patient:    Brett Mercado, Brett Mercado                      MRN: [id number] Proc. Date: 06/21/99 Adm. Date:  [id number] Disc. Date: [id number] Attending:  Daisey Must CC:         Narda Amber, M.D.             Luis Abed, M.D.             [facility] [place]             Cath Lab                        Cardiac Catheterization  PROCEDURE PERFORMED:  Left heart catheterization with coronary angiography, bypass graft angiography, and left ventriculography.  INDICATIONS:  The patient is a 65 year old male who is status post four vessel bypass surgery.  He has been having chest pain and was referred for cardiac catheterization.  PROCEDURAL NOTE:  A 6-French sheath was placed in the right femoral artery.  We  used a 6-French JL4 for injection of the left coronary.  The right coronary artery and all the bypass grafts were injected with a 6-French JR4.  We used an angled  pigtail for ventriculography.  Contrast was Omnipaque.  There were no complications.  RESULTS:  HEMODYNAMICS:  Left ventricular pressure 144/21, aortic pressure 144/94.  No aortic valve gradient.  LEFT VENTRICULOGRAM:  Wall motion is normal.  Ejection fraction estimated at 60%.  CORONARY ANGIOGRAPHY (RIGHT DOMINANT):  Left main has a distal 50% stenosis.  Left anterior descending has a proximal 70% stenosis.  There is a large first diagonal and a small second diagonal.  Left circumflex has a proximal 70% stenosis and a _____ 100% occlusion.  In the  distal vessel which fills by a saphenous vein graft, there is a 70% stenosis just after the origin of OM2.  Right coronary artery has a 70% stenosis in the mid vessel, 50% stenosis distally followed by a 40% stenosis distally just before the posterior descending artery. There is a large posterior descending artery, small first and second posterolateral branches, and a large third posterolateral branch.  In  the posterior descending  artery, there is 60% stenosis proximal to the vein graft insertion.  Left internal mammary artery to the mid LAD is patent throughout its course.  There is a free RIMA to the first diagonal branch.  This is patent throughout its course.  Saphenous vein graft to the second obtuse marginal branch is patent throughout ts course.  This fills both a second and third obtuse marginal.  There is a 70% stenosis in the A-V groove circumflex before the third obtuse marginal branch. The third obtuse marginal branch itself however is a small vessel.  Saphenous vein graft to the posterior descending artery is patent.  This fills he posterior descending artery as well as retrograde into the right coronary artery and into the posterolateral branches.  IMPRESSION: 1. Normal left ventricular systolic function. 2. Native three vessel coronary artery disease. 3. Patent bypass grafts times four.  Overall, the patient appears to be fully    revascularized.  RECOMMENDATIONS:  Continue medical therapy.  Any obvious lesions which may result in ischemia. DD:  06/21/99 TD:  06/22/99 Job: [id number]

## 2010-07-13 NOTE — Discharge Summary (Signed)
NAMEJASON, FRISBEE NO.:  [id number]   MEDICAL RECORD NO.:  [id number]          PATIENT TYPE:  INP   LOCATION:  3713                         FACILITY:  [facility]   PHYSICIAN:  Arvilla Meres, M.D. LHCDATE OF BIRTH:  20-Feb-1946   DATE OF ADMISSION:  08/31/2004  DATE OF DISCHARGE:  09/03/2004                                 DISCHARGE SUMMARY   PROCEDURES:  Cardiac catheterization, September 03, 2004.   REASON FOR ADMISSION:  Mr. Cephas is a 65 year old male with known coronary  artery disease, status post CABG in 1995 [facility]); who is followed in our [facility] by Dr. Willa Rough, and who initially presented to [facility]  with chest pain. He ruled out for myocardial infarction and subsequent  arrangements were made for transfer for a diagnostic cardiac  catheterization. Please refer to initial cardiology consultation note for  full details.   LABORATORY DATA:  CBC normal at discharge.  Electrolytes and renal function  normal.  Marginally elevated ALT 42 on admission.  Hemoglobin A1C 5.3.  Cardiac markers normal (x2). Lipid profile: Total cholesterol 173,  triglycerides 274, HDL 3, LDL 88. TSH 1.48.   ADMISSION CHEST X-RAY:  No acute disease.   HOSPITAL COURSE:  Following transfer from [facility], the patient  remained asymptomatic on a regimen consisting of aspirin, beta blocker,  nitroglycerin, Lovenox and Lipitor.  Follow-up serial cardiac markers  remained normal.   The patient was referred for cardiac catheterization on Monday, July 10 by  Dr. Lewayne Bunting (see report for full details), revealing widely patent bypass  grafts and preserved left ventricular function.  There was, however,  approximately 70% distal circumflex disease, for which Dr. Andee Lineman  recommended medical therapy. Imdur was added to the medication regimen.   Of note, Dr. Andee Lineman also recommended an outpatient event monitor, given the  patient's complaint of occasional  palpitations.   MEDICATIONS AT DISCHARGE:  1.  Coated aspirin 81 mg q.d.  2.  Lopressor 15 mg  b.i.d.  3.  Imdur 30 mg q.d. (new).  4.  Prevacid 30 mg q.d.  5.  Lipitor 40 mg q.d. .  6.  Neurontin 200 mg q.i.d.  7.  Nitroglycerin 0.4 mg as directed.   INSTRUCTION:  No heavy lifting/driving x2 days; maintain low-fat/cholesterol  diet; call the office if there is any swelling/heat in the groin.   FOLLOW UP:  The patient will follow up with Suszanne Conners. Duran, P.A.-C. in  [place], [place] on July 20 at 2:00 p.m.  The patient is instructed to present this  week for a 30-day event monitor.   DISCHARGE DIAGNOSES:  1.  Coronary artery disease.      1.  Patent bypass grafts with 70% distal circumflex disease by cardiac          catheterization September 03, 2004.      2.  Normal serial cardiac markers.      3.  Normal left ventricular function.      4.  Status post coronary artery bypass grafting in 1995.  2.  Palpitations.  3.  Dyslipidemia  4.  History of hypertension.  5.  History of gastroesophageal reflux disease.       GS/MEDQ  D:  09/03/2004  T:  09/03/2004  Job:  [id number]   cc:   [facility], [place], [place]   Regina Eck, M.D.  [place], [place]

## 2010-07-13 NOTE — Discharge Summary (Signed)
NAMEGLENDON, DUNWOODY NO.:  [id number]   MEDICAL RECORD NO.:  [id number]          PATIENT TYPE:  INP   LOCATION:  3007                         FACILITY:  [facility]   PHYSICIAN:  Cristi Loron, M.D.DATE OF BIRTH:  11-30-1945   DATE OF ADMISSION:  03/05/2004  DATE OF DISCHARGE:  03/14/2004                                 DISCHARGE SUMMARY   BRIEF HISTORY:  The patient is a 65 year old white male who has suffered  from back and bilateral leg pain.  He has failed medical management and he  has had a prior history of lumbar surgery in the remote past.  I worked him  up with a lumbar CT that demonstrated the patient had significant spinal  stenosis with degenerative changes as well as spondylolisthesis at L4-L5.  I  discussed the various treatment options with him including surgery.  The  patient has weighed the risks, benefits and alternatives to surgery and  decided to proceed with an L4-L5 decompression and fusion.   For further details of this admission, please refer to the typed history and  physical.   HOSPITAL COURSE:  I admitted the patient to [facility] on March 05, 2004.  On the day of admission, I  performed L4-L5 fusion on him.  His  surgery went well without complications (for full details of this operation,  please refer to typed operative note).   POSTOPERATIVE COURSE:  He was on a PCA pump postoperatively.  The patient  did have several other problems during his hospitalization and he developed  fever, we checked his UA, cultures, blood culture, chest x-ray, etcetera,  all looked okay.  He developed some abdominal distention and we thought he  had an early ileus.  Then he began to complain of abdominal pain.  I asked  the gastroenterologist to see the patient.  The patient was seen by Dr.  Juanda Chance and she made recommendations.  We ended up doing abdominal ultrasound  which turned out okay except he had evidence of bilateral  hydronephrosis.  We attributed this to some urinary retention in the postoperative period.  The patient had a previous relationship with a urologist and I recommended  that he follow up with him regarding this.  The patient also developed pain  in his left knee with some swelling.  I asked Dr. Sherlean Foot to see him, and he  made some recommendations and offered a course of injections, but the  patient did not want to have that done, his knee pain improved.   By March 14, 2004, the patient was afebrile, his vital signs were stable,  his abdominal pain had resolved, he was urinating well, ambulating well, his  wound was healing well, without signs of infection, and was requesting  discharge to home.  He was discharged home on March 14, 2004.   DISCHARGE INSTRUCTIONS:  The patient was instructed to follow up with me in  four weeks, to follow up with Dr. Juanda Chance as per her instructions, to follow  up with his urologist regarding his hydronephrosis, and if his knee pain  continues,  to follow up with Dr. Dannielle Huh.   FINAL DIAGNOSES:  1.  L4-L5 grade 1 acquired spondylolisthesis.  2.  Spinal stenosis.  3.  Herniated nucleus pulposus.  4.  Degenerative disk disease.  5.  Lumbago.  6.  Lumbar radiculopathy.  7.  Knee osteoarthritis.  8.  Hydronephrosis.  9.  Gallbladder sludge.   PROCEDURE PERFORMED:  L4 Gill procedure; bilateral L3 laminotomies; L4-L5  posterior lumbar interbody fusion; insertion of bilateral L4-L5 interbody  prosthesis (Alphatec interbody PEEK cages); L4-L5 posterior segmental  instrumentation with Alphatec pedicle screws and rods; posterolateral  arthrodesis L4-L5 with local morcellized autograft bone and tricalcium  phosphate bone graft extender.      JDJ/MEDQ  D:  05/03/2004  T:  05/03/2004  Job:  [id number]   cc:   Lina Sar, M.D. [facility]

## 2010-07-27 ENCOUNTER — Encounter: Payer: Self-pay | Admitting: Cardiology

## 2010-07-27 DIAGNOSIS — R002 Palpitations: Secondary | ICD-10-CM | POA: Insufficient documentation

## 2010-07-27 DIAGNOSIS — I739 Peripheral vascular disease, unspecified: Secondary | ICD-10-CM

## 2010-07-27 DIAGNOSIS — IMO0002 Reserved for concepts with insufficient information to code with codable children: Secondary | ICD-10-CM | POA: Insufficient documentation

## 2010-07-27 DIAGNOSIS — R9430 Abnormal result of cardiovascular function study, unspecified: Secondary | ICD-10-CM | POA: Insufficient documentation

## 2010-07-27 DIAGNOSIS — E785 Hyperlipidemia, unspecified: Secondary | ICD-10-CM | POA: Insufficient documentation

## 2010-07-27 DIAGNOSIS — M5090 Cervical disc disorder, unspecified, unspecified cervical region: Secondary | ICD-10-CM | POA: Insufficient documentation

## 2010-07-30 ENCOUNTER — Encounter: Payer: Self-pay | Admitting: *Deleted

## 2010-07-31 ENCOUNTER — Encounter: Payer: Self-pay | Admitting: *Deleted

## 2010-07-31 ENCOUNTER — Ambulatory Visit: Payer: Medicare Other | Admitting: Cardiology

## 2010-08-03 DIAGNOSIS — R079 Chest pain, unspecified: Secondary | ICD-10-CM

## 2010-08-06 ENCOUNTER — Other Ambulatory Visit: Payer: Self-pay | Admitting: *Deleted

## 2010-08-06 MED ORDER — ISOSORBIDE MONONITRATE ER 30 MG PO TB24: 30.0000 mg | ORAL_TABLET | Freq: Every day | ORAL | Status: DC

## 2010-08-08 ENCOUNTER — Encounter: Payer: Self-pay | Admitting: Cardiology

## 2010-09-03 ENCOUNTER — Encounter: Payer: Self-pay | Admitting: Cardiology

## 2010-09-04 ENCOUNTER — Encounter: Payer: Self-pay | Admitting: Cardiology

## 2010-09-04 ENCOUNTER — Ambulatory Visit (INDEPENDENT_AMBULATORY_CARE_PROVIDER_SITE_OTHER): Payer: Medicare Other | Admitting: Cardiology

## 2010-09-04 DIAGNOSIS — I2510 Atherosclerotic heart disease of native coronary artery without angina pectoris: Secondary | ICD-10-CM

## 2010-09-04 DIAGNOSIS — E785 Hyperlipidemia, unspecified: Secondary | ICD-10-CM

## 2010-09-04 DIAGNOSIS — I10 Essential (primary) hypertension: Secondary | ICD-10-CM

## 2010-09-04 MED ORDER — HYDROCHLOROTHIAZIDE 25 MG PO TABS: 25.0000 mg | ORAL_TABLET | Freq: Every day | ORAL | Status: DC

## 2010-09-04 MED ORDER — NITROGLYCERIN 0.4 MG SL SUBL: 0.4000 mg | SUBLINGUAL_TABLET | SUBLINGUAL | Status: DC | PRN

## 2010-09-04 NOTE — Progress Notes (Signed)
HPI Patient is seen for followup coronary disease.  I saw him a year ago.  He's not having any significant symptoms.  His last catheterization was in 2006.  His last nuclear study was 2010.  This was a low risk scan.  He has normal LV function.  When I saw him last his blood pressure was mildly elevated.  Today again it is elevated.  He is on medications.  We will need to change his medicines. Allergies  Allergen Reactions  . Codeine   . Meperidine Hcl   . Metoclopramide Hcl   . Other     VISTAID ANESTHESIA  . Prochlorperazine Edisylate     Current Outpatient Prescriptions  Medication Sig Dispense Refill  . amitriptyline (ELAVIL) 25 MG tablet Take 50 mg by mouth at bedtime.        Marland Kitchen amLODipine-benazepril (LOTREL) 10-20 MG per capsule Take 1 capsule by mouth daily.        Marland Kitchen aspirin 325 MG tablet Take 325 mg by mouth daily.        Marland Kitchen atorvastatin (LIPITOR) 10 MG tablet Take 10 mg by mouth daily.        . Cholecalciferol (VITAMIN D3) 400 UNITS CAPS Take 1 capsule by mouth daily.        . clopidogrel (PLAVIX) 75 MG tablet Take 75 mg by mouth daily.        . fexofenadine (ALLEGRA) 180 MG tablet Take 180 mg by mouth daily.        Marland Kitchen gabapentin (NEURONTIN) 600 MG tablet Take 600 mg by mouth 4 (four) times daily.        . isosorbide mononitrate (IMDUR) 30 MG 24 hr tablet Take 1 tablet (30 mg total) by mouth daily.  90 tablet  0  . meloxicam (MOBIC) 15 MG tablet Take 15 mg by mouth daily.        . metoprolol tartrate (LOPRESSOR) 25 MG tablet Take 25 mg by mouth daily.        . nitroGLYCERIN (NITROSTAT) 0.4 MG SL tablet Place 0.4 mg under the tongue every 5 (five) minutes as needed.          History   Social History  . Marital Status: Married    Spouse Name: N/A    Number of Children: N/A  . Years of Education: N/A   Occupational History  . RETIRED    Social History Main Topics  . Smoking status: Never Smoker   . Smokeless tobacco: Never Used   Comment: tobacco use - no  . Alcohol  Use: No  . Drug Use: No  . Sexually Active: Not on file   Other Topics Concern  . Not on file   Social History Narrative   Married.     Family History  Problem Relation Age of Onset  . Coronary artery disease      family hx    Past Medical History  Diagnosis Date  . CAD (coronary artery disease)     Catheterization 2006, patent grafts, possible ischemia distal circumflex not amenable to PCI  /   nuclear, June, 2010, possible mild inferobasal ischemia, low risk, EF 52%  . Cervical disc disease   . Hypertension   . Dyslipidemia   . Carotid artery disease     Doppler, June, 2010, less than 50% bilateral  . [facility]     [facility]  . Ejection fraction     52%, nuclear, 2010  . Palpitations     Past  Surgical History  Procedure Date  . Coronary artery bypass graft 1995    [facility])  . Back surgery     ROS  Patient denies fever, chills, headache, sweats, rash, change in vision, change in hearing, chest pain, cough, nausea vomiting, urinary symptoms.  All other systems are reviewed and are negative.  PHYSICAL EXAM Patient is oriented to person time and place.  Affect is normal.  Head is atraumatic.  There is no xanthelasma.  Lungs are clear.  Respiratory effort is nonlabored.  Cardiac exam reveals S1 and S2.  No clicks or significant murmurs.  The abdomen is soft.  There is no peripheral edema.  There are no musculoskeletal deformities.There no skin rashes.  Skin rashes. Filed Vitals:   09/04/10 0943 09/04/10 0958  BP: 171/97 165/96  Pulse: 76 69  Height: 5\' 8"  (1.727 m)   Weight: 167 lb (75.751 kg)     EKG Is done today and reviewed by me.  There is normal sinus rhythm.  There is left axis.  There is no significant change.  ASSESSMENT & PLAN

## 2010-09-04 NOTE — Patient Instructions (Signed)
Follow up as scheduled. Start HCTZ 25 mg daily. Your physician has requested that you regularly monitor and record your blood pressure readings at home. Please use the same machine at the same time of day to check your readings and record them to bring to your follow-up visit.

## 2010-09-04 NOTE — Assessment & Plan Note (Signed)
Blood pressure is elevated.  He is on a combination amlodipine and ACE inhibitor.  He is on low-dose nitrate.  He is on low-dose beta blocker.  I put thiazide will be added to his medications.  Otherwise see him for followup.

## 2010-09-04 NOTE — Assessment & Plan Note (Signed)
Coronary disease is stable.  He does not require any further workup at this time.

## 2010-09-04 NOTE — Assessment & Plan Note (Signed)
Lipids are being treated and followed by his primary physician.

## 2010-10-10 ENCOUNTER — Ambulatory Visit (INDEPENDENT_AMBULATORY_CARE_PROVIDER_SITE_OTHER): Payer: Medicare Other | Admitting: Internal Medicine

## 2010-10-10 ENCOUNTER — Encounter (INDEPENDENT_AMBULATORY_CARE_PROVIDER_SITE_OTHER): Payer: Self-pay | Admitting: Internal Medicine

## 2010-10-10 VITALS — BP 122/76 | HR 72 | Temp 98.0°F | Ht 68.0 in | Wt 169.5 lb

## 2010-10-10 DIAGNOSIS — K8689 Other specified diseases of pancreas: Secondary | ICD-10-CM

## 2010-10-10 NOTE — Progress Notes (Signed)
Subjective:     Patient ID: Brett Mercado, male   DOB: 04-27-1945, 65 y.o.   MRN: [id number]  HPI  :Brett Mercado is a 65 yr old white male referred to our office by Dr. Olena Leatherwood for a pancreatic stone. He was seen by Dr. Olena Leatherwood in July for follow up of a liver mass that was found years ago.  He denies having any symptoms.  He underwent a CT abdomen with CM 09/19/2010 which revealed innumerable non-enhancing cysts throughout the liver, the largest in the lateral segment left lobe measuring approximately 5 cm.  Diffuse hepatic steatosis with focal sparing surrounding the gallbladder, accounting for the increased attenuation surround the gallbladder on the unenhanced and arterial phase images. Heterogenous enhancement of the spleen without focal splenic parenchymal  Abnormality. Calcification within the pancreatic duct in the head of the pancreas. Layering tiny gallstones and/or sludge within the gallbladder without CT evidence of acute cholecystitis. No biliary ductal dilatation. Small calcifications/stone in the pancreatic duct in the head of the pancreas.  Brett Mercado actually saw Dr Teena Dunk 2 weeks ago and again this week.  Dr. Teena Dunk wanted to schedule an EUS at [facility], but at this point Brett Mercado wanted a second opinion. His appetite is good. He has lost 14-16 pounds over the past yr unintentionally.  NO abdominal pain. He usually has 1-2 BMs a day. No melena or bright red rectal bleeding.  He does c/o not being able to swallow anything large since having neck surgery. He eats in small amts and small bites.  He remains asymptomatic.   Review of Systems     Objective:   Physical ExamAlert and oriented. Skin warm and dry. Oral mucosa is moist. Natural teeth in good condition. Sclera anicteric, conjunctivae is pink. Thyroid not enlarged. No cervical lymphadenopathy. Lungs clear. Heart regular rate and rhythm.  Abdomen is soft. Bowel sounds are positive. No hepatomegaly. No abdominal masses felt. No tenderness.  No  edema to lower extremities. Patient is alert and oriented.  No jaundice. 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 At present, he is asymptomatic.    Plan:    EUS at [facility].  I discussed this case with Dr. Karilyn Cota. Will obtain an EUS and further recommendations once we have this report back.   Will also obtain a C-met, amylase, lipase.

## 2010-10-10 NOTE — Progress Notes (Signed)
Addended by: Len Blalock on: 10/10/2010 01:58 PM   Modules accepted: Orders

## 2010-10-11 ENCOUNTER — Telehealth (INDEPENDENT_AMBULATORY_CARE_PROVIDER_SITE_OTHER): Payer: Self-pay | Admitting: Internal Medicine

## 2010-10-11 LAB — AMYLASE: Amylase: 51 U/L (ref 0–105)

## 2010-10-11 NOTE — Telephone Encounter (Signed)
All labs WNL. Results given to patient.

## 2010-10-15 ENCOUNTER — Telehealth (INDEPENDENT_AMBULATORY_CARE_PROVIDER_SITE_OTHER): Payer: Self-pay | Admitting: Internal Medicine

## 2010-10-15 NOTE — Telephone Encounter (Signed)
Results have been given to patient 

## 2010-11-21 DIAGNOSIS — D490 Neoplasm of unspecified behavior of digestive system: Secondary | ICD-10-CM | POA: Insufficient documentation

## 2010-11-21 LAB — BASIC METABOLIC PANEL
Chloride: 107
Creatinine, Ser: 0.99
GFR calc Af Amer: >60
Potassium: 4
Sodium: 140

## 2010-11-21 LAB — HEMOGLOBIN AND HEMATOCRIT, BLOOD
HCT: 43.6
Hemoglobin: 15.8

## 2010-11-26 ENCOUNTER — Encounter: Payer: Self-pay | Admitting: Cardiology

## 2010-11-30 ENCOUNTER — Ambulatory Visit: Payer: Medicare Other | Admitting: Cardiology

## 2010-11-30 ENCOUNTER — Encounter: Payer: Self-pay | Admitting: Cardiology

## 2010-12-10 ENCOUNTER — Encounter: Payer: Self-pay | Admitting: Cardiology

## 2010-12-10 ENCOUNTER — Ambulatory Visit (INDEPENDENT_AMBULATORY_CARE_PROVIDER_SITE_OTHER): Payer: Medicare Other | Admitting: Cardiology

## 2010-12-10 VITALS — BP 128/80 | HR 108 | Ht 68.0 in | Wt 167.0 lb

## 2010-12-10 DIAGNOSIS — I779 Disorder of arteries and arterioles, unspecified: Secondary | ICD-10-CM

## 2010-12-10 DIAGNOSIS — R002 Palpitations: Secondary | ICD-10-CM

## 2010-12-10 DIAGNOSIS — Z01810 Encounter for preprocedural cardiovascular examination: Secondary | ICD-10-CM

## 2010-12-10 DIAGNOSIS — I10 Essential (primary) hypertension: Secondary | ICD-10-CM

## 2010-12-10 DIAGNOSIS — I2510 Atherosclerotic heart disease of native coronary artery without angina pectoris: Secondary | ICD-10-CM

## 2010-12-10 NOTE — Assessment & Plan Note (Signed)
At the time of his last visit there was question of elevated blood pressure.  His pressure has been checked at home and has been normal.  His pressure today here is normal.  No further workup.

## 2010-12-10 NOTE — Progress Notes (Signed)
HPI Patient is seen today to followup coronary disease and hypertension.  He is also here to be sure about cardiac clearance for upcoming GI surgery.  I had seen him in July, 2012.  He's actually quite stable.  He underwent CABG in 1995.  His last catheter was in 2006 and was stable.  He had a nuclear study in 2010.  There was normal LV function and no significant ischemia.  He has not had any significant chest pain.  When I saw him last there was question of some elevation in his blood pressure.  I had considered changing his medicines but did not.  He checked his pressure, and has been under good control.  He returns today to tell me that he needs to have pancreas surgery for a nonmalignant growth on January 01, 2011.  This is being done at [facility].  From the cardiac viewpoint he is stable.   Allergies  Allergen Reactions  . Codeine   . Meperidine Hcl   . Metoclopramide Hcl   . Other     VISTAID ANESTHESIA  . Percocet (Oxycodone-Acetaminophen)   . Prochlorperazine Edisylate     Current Outpatient Prescriptions  Medication Sig Dispense Refill  . amitriptyline (ELAVIL) 25 MG tablet Take 75 mg by mouth at bedtime.       Marland Kitchen aspirin 81 MG chewable tablet Chew 81 mg by mouth daily.        Marland Kitchen atorvastatin (LIPITOR) 10 MG tablet Take 10 mg by mouth daily.        . Cholecalciferol (VITAMIN D3) 400 UNITS CAPS Take 1 capsule by mouth daily.        . fexofenadine (ALLEGRA) 180 MG tablet Take 180 mg by mouth daily.        Marland Kitchen gabapentin (NEURONTIN) 600 MG tablet Take 600 mg by mouth 4 (four) times daily.       . isosorbide mononitrate (IMDUR) 30 MG 24 hr tablet Take 1 tablet (30 mg total) by mouth daily.  90 tablet  0  . metoprolol tartrate (LOPRESSOR) 25 MG tablet Take 25 mg by mouth daily.        . nitroGLYCERIN (NITROSTAT) 0.4 MG SL tablet Place 1 tablet (0.4 mg total) under the tongue every 5 (five) minutes as needed.  25 tablet  3  . Tamsulosin HCl (FLOMAX) 0.4 MG CAPS Take 1 tablet by  mouth Daily.      . VOLTAREN 1 % GEL as needed.        History   Social History  . Marital Status: Married    Spouse Name: N/A    Number of Children: N/A  . Years of Education: N/A   Occupational History  . RETIRED    Social History Main Topics  . Smoking status: Never Smoker   . Smokeless tobacco: Never Used   Comment: tobacco use - no  . Alcohol Use: No  . Drug Use: No  . Sexually Active: Not on file   Other Topics Concern  . Not on file   Social History Narrative   Married.     Family History  Problem Relation Age of Onset  . Coronary artery disease      family hx    Past Medical History  Diagnosis Date  . CAD (coronary artery disease)     Catheterization 2006, patent grafts, possible ischemia distal circumflex not amenable to PCI  /   nuclear, June, 2010, possible mild inferobasal ischemia, low risk, EF 52%  .  Cervical disc disease   . Hypertension   . Dyslipidemia   . Carotid artery disease     Doppler, June, 2010, less than 50% bilateral  . [facility]     [facility]  . Ejection fraction     52%, nuclear, 2010  . Palpitations   . Hypertension     x 15 yrs.  . Arthritis   . Neuropathy     Past Surgical History  Procedure Date  . Coronary artery bypass graft 1995    [facility])  . Back surgery   . Coronary artery bypass graft     with 4 vessel bypass in 1995  . Cervical disc surgery     x 2 in 1997 and 2010  . Back surgery     x 2. One 8 yrs ago and one in 1980    ROS  Patient denies fever, chills, headache, sweats, rash, change in vision, change in hearing, chest pain, cough, nausea vomiting, urinary symptoms.  All other systems are reviewed and are negative  PHYSICAL EXAM Patient is oriented to person time and place.  Affect is normal.  Head is atraumatic.  There is no xanthelasma.  Lungs are clear.  Respiratory effort is not labored.  No jugular venous distention.  Cardiac exam reveals S1-S2.  No clicks or significant murmurs.  The abdomen is  soft.  There's no peripheral edema.  There are no musculoskeletal deformities.  No skin rashes. Filed Vitals:   12/10/10 1309  BP: 128/80  Pulse: 108  Height: 5\' 8"  (1.727 m)  Weight: 167 lb (75.751 kg)    EKG is not done today.  It had been done in July, 2012 and showed no significant change. ASSESSMENT & PLAN

## 2010-12-10 NOTE — Assessment & Plan Note (Signed)
Patient has mild carotid artery disease.  He will leave followup Doppler in the future but not at this time.

## 2010-12-10 NOTE — Patient Instructions (Signed)
Your physician wants you to follow-up in: 6 months. You will receive a reminder letter in the mail one-two months in advance. If you don't receive a letter, please call our office to schedule the follow-up appointment. Your physician recommends that you continue on your current medications as directed. Please refer to the Current Medication list given to you today. 

## 2010-12-10 NOTE — Assessment & Plan Note (Signed)
Cardiac disease is stable.  No further workup is needed.  He had a nuclear scan in 2010 revealing no significant ischemia.  LV function is normal.

## 2010-12-10 NOTE — Assessment & Plan Note (Signed)
He is not having any significant palpitations. No change in therapy. 

## 2010-12-10 NOTE — Assessment & Plan Note (Signed)
Patient is scheduled to have surgery on his pancreas in November, 2012.  He has good exercise tolerance.  He has no chest pain.  He has not had a recent MI.  There is no evidence of congestive heart failure.  He has not had any significant arrhythmias.  He's had a nuclear study showing no marked ischemia 2 years ago.  He does not need further cardiovascular workup.  He is stable.  He is cleared for his pancreatic surgery.

## 2010-12-21 DIAGNOSIS — I10 Essential (primary) hypertension: Secondary | ICD-10-CM | POA: Insufficient documentation

## 2010-12-21 DIAGNOSIS — Z0389 Encounter for observation for other suspected diseases and conditions ruled out: Secondary | ICD-10-CM | POA: Insufficient documentation

## 2011-01-10 DIAGNOSIS — A0472 Enterocolitis due to Clostridium difficile, not specified as recurrent: Secondary | ICD-10-CM | POA: Insufficient documentation

## 2011-01-21 DIAGNOSIS — C250 Malignant neoplasm of head of pancreas: Secondary | ICD-10-CM | POA: Insufficient documentation

## 2011-01-23 DIAGNOSIS — R7881 Bacteremia: Secondary | ICD-10-CM | POA: Insufficient documentation

## 2011-02-27 ENCOUNTER — Other Ambulatory Visit: Payer: Self-pay | Admitting: Cardiology

## 2011-02-27 DIAGNOSIS — Z85820 Personal history of malignant melanoma of skin: Secondary | ICD-10-CM | POA: Diagnosis not present

## 2011-02-27 DIAGNOSIS — Z951 Presence of aortocoronary bypass graft: Secondary | ICD-10-CM | POA: Diagnosis not present

## 2011-02-27 DIAGNOSIS — Z79899 Other long term (current) drug therapy: Secondary | ICD-10-CM | POA: Diagnosis not present

## 2011-02-27 DIAGNOSIS — Z7982 Long term (current) use of aspirin: Secondary | ICD-10-CM | POA: Diagnosis not present

## 2011-02-27 DIAGNOSIS — Z510 Encounter for antineoplastic radiation therapy: Secondary | ICD-10-CM | POA: Diagnosis not present

## 2011-02-27 DIAGNOSIS — I10 Essential (primary) hypertension: Secondary | ICD-10-CM | POA: Diagnosis not present

## 2011-02-27 DIAGNOSIS — Z800 Family history of malignant neoplasm of digestive organs: Secondary | ICD-10-CM | POA: Diagnosis not present

## 2011-02-27 DIAGNOSIS — C250 Malignant neoplasm of head of pancreas: Secondary | ICD-10-CM | POA: Diagnosis not present

## 2011-02-27 DIAGNOSIS — N200 Calculus of kidney: Secondary | ICD-10-CM | POA: Diagnosis not present

## 2011-02-27 DIAGNOSIS — I2510 Atherosclerotic heart disease of native coronary artery without angina pectoris: Secondary | ICD-10-CM | POA: Diagnosis not present

## 2011-02-27 DIAGNOSIS — Z5111 Encounter for antineoplastic chemotherapy: Secondary | ICD-10-CM | POA: Diagnosis not present

## 2011-02-27 DIAGNOSIS — E785 Hyperlipidemia, unspecified: Secondary | ICD-10-CM | POA: Diagnosis not present

## 2011-02-28 DIAGNOSIS — C250 Malignant neoplasm of head of pancreas: Secondary | ICD-10-CM | POA: Diagnosis not present

## 2011-02-28 DIAGNOSIS — K909 Intestinal malabsorption, unspecified: Secondary | ICD-10-CM | POA: Insufficient documentation

## 2011-03-01 ENCOUNTER — Encounter (HOSPITAL_COMMUNITY): Payer: Self-pay

## 2011-03-01 ENCOUNTER — Other Ambulatory Visit (HOSPITAL_COMMUNITY): Payer: Self-pay | Admitting: Hematology and Oncology

## 2011-03-01 DIAGNOSIS — N200 Calculus of kidney: Secondary | ICD-10-CM | POA: Diagnosis not present

## 2011-03-01 DIAGNOSIS — C259 Malignant neoplasm of pancreas, unspecified: Secondary | ICD-10-CM

## 2011-03-01 DIAGNOSIS — E785 Hyperlipidemia, unspecified: Secondary | ICD-10-CM | POA: Diagnosis not present

## 2011-03-01 DIAGNOSIS — Z510 Encounter for antineoplastic radiation therapy: Secondary | ICD-10-CM | POA: Diagnosis not present

## 2011-03-01 DIAGNOSIS — I10 Essential (primary) hypertension: Secondary | ICD-10-CM | POA: Diagnosis not present

## 2011-03-01 DIAGNOSIS — I2510 Atherosclerotic heart disease of native coronary artery without angina pectoris: Secondary | ICD-10-CM | POA: Diagnosis not present

## 2011-03-01 DIAGNOSIS — Z5111 Encounter for antineoplastic chemotherapy: Secondary | ICD-10-CM | POA: Diagnosis not present

## 2011-03-01 DIAGNOSIS — C250 Malignant neoplasm of head of pancreas: Secondary | ICD-10-CM | POA: Diagnosis not present

## 2011-03-01 DIAGNOSIS — Z85820 Personal history of malignant melanoma of skin: Secondary | ICD-10-CM | POA: Diagnosis not present

## 2011-03-04 ENCOUNTER — Other Ambulatory Visit: Payer: Self-pay | Admitting: Radiology

## 2011-03-06 ENCOUNTER — Other Ambulatory Visit (HOSPITAL_COMMUNITY): Payer: Self-pay | Admitting: Physician Assistant

## 2011-03-06 DIAGNOSIS — C250 Malignant neoplasm of head of pancreas: Secondary | ICD-10-CM | POA: Diagnosis not present

## 2011-03-06 DIAGNOSIS — Z510 Encounter for antineoplastic radiation therapy: Secondary | ICD-10-CM | POA: Diagnosis not present

## 2011-03-06 DIAGNOSIS — Z5111 Encounter for antineoplastic chemotherapy: Secondary | ICD-10-CM | POA: Diagnosis not present

## 2011-03-06 DIAGNOSIS — I10 Essential (primary) hypertension: Secondary | ICD-10-CM | POA: Diagnosis not present

## 2011-03-06 DIAGNOSIS — I2510 Atherosclerotic heart disease of native coronary artery without angina pectoris: Secondary | ICD-10-CM | POA: Diagnosis not present

## 2011-03-06 DIAGNOSIS — E785 Hyperlipidemia, unspecified: Secondary | ICD-10-CM | POA: Diagnosis not present

## 2011-03-07 ENCOUNTER — Other Ambulatory Visit (HOSPITAL_COMMUNITY): Payer: Self-pay | Admitting: Hematology and Oncology

## 2011-03-07 ENCOUNTER — Ambulatory Visit (HOSPITAL_COMMUNITY)
Admission: RE | Admit: 2011-03-07 | Discharge: 2011-03-07 | Disposition: A | Discharge status: Home / Self Care | Payer: Medicare Other | Source: Ambulatory Visit | Attending: Hematology and Oncology | Admitting: Hematology and Oncology

## 2011-03-07 ENCOUNTER — Inpatient Hospital Stay (HOSPITAL_COMMUNITY): Admission: RE | Admit: 2011-03-07 | Payer: Medicare Other | Source: Ambulatory Visit

## 2011-03-07 DIAGNOSIS — N200 Calculus of kidney: Secondary | ICD-10-CM | POA: Diagnosis not present

## 2011-03-07 DIAGNOSIS — I10 Essential (primary) hypertension: Secondary | ICD-10-CM | POA: Diagnosis not present

## 2011-03-07 DIAGNOSIS — E785 Hyperlipidemia, unspecified: Secondary | ICD-10-CM | POA: Diagnosis not present

## 2011-03-07 DIAGNOSIS — C259 Malignant neoplasm of pancreas, unspecified: Secondary | ICD-10-CM

## 2011-03-07 DIAGNOSIS — C250 Malignant neoplasm of head of pancreas: Secondary | ICD-10-CM | POA: Diagnosis not present

## 2011-03-07 DIAGNOSIS — Z510 Encounter for antineoplastic radiation therapy: Secondary | ICD-10-CM | POA: Diagnosis not present

## 2011-03-07 DIAGNOSIS — I2510 Atherosclerotic heart disease of native coronary artery without angina pectoris: Secondary | ICD-10-CM | POA: Diagnosis not present

## 2011-03-07 DIAGNOSIS — Z85820 Personal history of malignant melanoma of skin: Secondary | ICD-10-CM | POA: Diagnosis not present

## 2011-03-07 DIAGNOSIS — Z5111 Encounter for antineoplastic chemotherapy: Secondary | ICD-10-CM | POA: Diagnosis not present

## 2011-03-07 MED ORDER — MIDAZOLAM HCL 5 MG/5ML IJ SOLN
INTRAMUSCULAR | Status: AC | PRN
  Administered 2011-03-07: 2 mg via INTRAVENOUS
  Administered 2011-03-07: 1 mg via INTRAVENOUS

## 2011-03-07 MED ORDER — CEFAZOLIN SODIUM 1-5 GM-% IV SOLN
1.0000 g | Freq: Once | INTRAVENOUS | Status: AC
  Administered 2011-03-07: 1 g via INTRAVENOUS
  Filled 2011-03-07 (×2): qty 50

## 2011-03-07 MED ORDER — FENTANYL CITRATE 0.05 MG/ML IJ SOLN
INTRAMUSCULAR | Status: AC | PRN
  Administered 2011-03-07: 50 ug via INTRAVENOUS
  Administered 2011-03-07: 100 ug via INTRAVENOUS

## 2011-03-07 MED ORDER — SODIUM CHLORIDE 0.9 % IV SOLN
INTRAVENOUS | Status: DC
  Administered 2011-03-07: 14:00:00 via INTRAVENOUS
  Filled 2011-03-07: qty 1000

## 2011-03-07 NOTE — Procedures (Signed)
Procedure:  Porta-cath Access:  Rt. IJ vein Findings:  SL PAC via rt IJ.  Tip at cavoatrial junction.  No PTX.  OK to use.

## 2011-03-07 NOTE — H&P (Signed)
Brett Mercado is an 66 y.o. male.   Chief Complaint: "I'm here for a port a cath" HPI: Patient with history of pancreatic carcinoma presents today for elective port a cath placement for chemotherapy.  Past Medical History  Diagnosis Date  . CAD (coronary artery disease)     Catheterization 2006, patent grafts, possible ischemia distal circumflex not amenable to PCI  /   nuclear, June, 2010, possible mild inferobasal ischemia, low risk, EF 52%  . Cervical disc disease   . Hypertension   . Dyslipidemia   . Carotid artery disease     Doppler, June, 2010, less than 50% bilateral  . [facility]x [facility]G     [facility]  . Ejection fraction     52%, nuclear, 2010  . Palpitations   . Hypertension     x 15 yrs.  . Arthritis   . Neuropathy   . Preop cardiovascular exam     Cardiac clearance for pancreatic surgery November, 2012  Pancreatic cancer 12/2010  Past Surgical History  Procedure Date  . Coronary artery bypass graft 1995    [facility])  . Back surgery   . Coronary artery bypass graft     with 4 vessel bypass in 1995  . Cervical disc surgery     x 2 in 1997 and 2010  . Back surgery     x 2. One 8 yrs ago and one in 1980  [facility]  12/2010  [facility]   Family History  Problem Relation Age of Onset  . Coronary artery disease      family hx  brother deceased with pancreatic cancer Social History:  reports that he has never smoked. He has never used smokeless tobacco. He reports that he does not drink alcohol or use illicit drugs.  Allergies:  Allergies  Allergen Reactions  . Codeine Itching and Swelling  . Meperidine Hcl Itching and Swelling  . Metoclopramide Hcl Itching and Swelling  . Other     VISTAID ANESTHESIA  . Percocet (Oxycodone-Acetaminophen) Itching and Swelling  . Prochlorperazine Edisylate Other (See Comments)    Unsure of reaction    Medications Prior to Admission  Medication Sig Dispense Refill  . amitriptyline (ELAVIL) 25 MG tablet Take 75 mg by mouth  at bedtime.       Marland Kitchen aspirin 81 MG chewable tablet Chew 81 mg by mouth daily.        Marland Kitchen atorvastatin (LIPITOR) 10 MG tablet Take 10 mg by mouth daily.        . cefUROXime (CEFTIN) 500 MG tablet Take 500 mg by mouth 2 (two) times daily.        . Cholecalciferol (VITAMIN D3) 400 UNITS CAPS Take 1 capsule by mouth daily.        . ferrous gluconate (FERGON) 325 MG tablet Take 325 mg by mouth daily with breakfast.        . fexofenadine (ALLEGRA) 180 MG tablet Take 180 mg by mouth daily.        Marland Kitchen gabapentin (NEURONTIN) 600 MG tablet Take 600 mg by mouth 4 (four) times daily.       . isosorbide mononitrate (IMDUR) 30 MG 24 hr tablet TAKE ONE TABLET BY MOUTH EVERY DAY  90 tablet  3  . metoprolol tartrate (LOPRESSOR) 25 MG tablet Take 25 mg by mouth every evening.       . nitroGLYCERIN (NITROSTAT) 0.4 MG SL tablet Place 0.4 mg under the tongue every 5 (five) minutes as needed. For  chest pain.       Marland Kitchen ondansetron (ZOFRAN) 8 MG tablet Take 8 mg by mouth every 8 (eight) hours as needed. For nausea.       Marland Kitchen oxyCODONE (OXY IR/ROXICODONE) 5 MG immediate release tablet Take 5 mg by mouth every 4 (four) hours as needed. For pain.       . Pancrelipase, Lip-Prot-Amyl, (CREON) 24000 UNITS CPEP Take 2 capsules by mouth 3 (three) times daily before meals.        Bertram Gala Glycol-Propyl Glycol (SYSTANE) 0.4-0.3 % GEL Apply 1 drop to eye as needed. For irritated eyes       . promethazine (PHENERGAN) 25 MG tablet Take 25 mg by mouth every 6 (six) hours as needed. For nausea        . Tamsulosin HCl (FLOMAX) 0.4 MG CAPS Take 1 tablet by mouth Daily.      . vitamin B-12 (CYANOCOBALAMIN) 1000 MCG tablet Take 2,000 mcg by mouth daily.        . VOLTAREN 1 % GEL Apply 1 application topically as needed. For knee pain.       Medications Prior to Admission  Medication Dose Route Frequency Provider Last Rate Last Dose  . 0.9 %  sodium chloride infusion   Intravenous Continuous Robet Leu, PA      . ceFAZolin (ANCEF) IVPB 1  g/50 mL premix  1 g Intravenous Once Robet Leu, PA        Results for orders placed in visit on 10/10/10  LIPASE      Component Value Range   Lipase 12  0 - 75 (U/L)  AMYLASE      Component Value Range   Amylase 51  0 - 105 (U/L)   LABS (03/07/11):  WBC 6.1  HGB  12.1  PLT 236 K      02/27/2011  CREAT.    1.10 Review of Systems  Constitutional: Negative for fever and chills.  Respiratory: Negative for cough and shortness of breath.   Cardiovascular: Negative for chest pain.  Gastrointestinal: Negative for nausea, vomiting and abdominal pain.  Neurological: Negative for headaches.  Endo/Heme/Allergies: Does not bruise/bleed easily.    Blood pressure 147/88, pulse 87, temperature 98.4 F (36.9 C), resp. rate 14, weight 167 lb (75.751 kg), SpO2 98.00%. Physical Exam  Constitutional: He is oriented to person, place, and time. He appears well-developed and well-nourished.  Cardiovascular: Normal rate and regular rhythm.   Respiratory: Effort normal and breath sounds normal.  GI: Soft. Bowel sounds are normal. There is no tenderness.       Clean midline scar from whipple  Musculoskeletal: Normal range of motion. He exhibits no edema.  Neurological: He is alert and oriented to person, place, and time.  Psychiatric: He has a normal mood and affect.     Assessment/Plan Patient with pancreatic carcinoma; plan is for port a cath placement for chemotherapy.  ALLRED,D KEVIN 03/07/2011, 1:12 PM

## 2011-03-08 DIAGNOSIS — I10 Essential (primary) hypertension: Secondary | ICD-10-CM | POA: Diagnosis not present

## 2011-03-08 DIAGNOSIS — Z510 Encounter for antineoplastic radiation therapy: Secondary | ICD-10-CM | POA: Diagnosis not present

## 2011-03-08 DIAGNOSIS — C250 Malignant neoplasm of head of pancreas: Secondary | ICD-10-CM | POA: Diagnosis not present

## 2011-03-08 DIAGNOSIS — E785 Hyperlipidemia, unspecified: Secondary | ICD-10-CM | POA: Diagnosis not present

## 2011-03-08 DIAGNOSIS — I2510 Atherosclerotic heart disease of native coronary artery without angina pectoris: Secondary | ICD-10-CM | POA: Diagnosis not present

## 2011-03-08 DIAGNOSIS — Z5111 Encounter for antineoplastic chemotherapy: Secondary | ICD-10-CM | POA: Diagnosis not present

## 2011-03-13 DIAGNOSIS — I2510 Atherosclerotic heart disease of native coronary artery without angina pectoris: Secondary | ICD-10-CM | POA: Diagnosis not present

## 2011-03-13 DIAGNOSIS — Z510 Encounter for antineoplastic radiation therapy: Secondary | ICD-10-CM | POA: Diagnosis not present

## 2011-03-13 DIAGNOSIS — C250 Malignant neoplasm of head of pancreas: Secondary | ICD-10-CM | POA: Diagnosis not present

## 2011-03-13 DIAGNOSIS — Z5111 Encounter for antineoplastic chemotherapy: Secondary | ICD-10-CM | POA: Diagnosis not present

## 2011-03-13 DIAGNOSIS — I10 Essential (primary) hypertension: Secondary | ICD-10-CM | POA: Diagnosis not present

## 2011-03-13 DIAGNOSIS — E785 Hyperlipidemia, unspecified: Secondary | ICD-10-CM | POA: Diagnosis not present

## 2011-03-14 DIAGNOSIS — Z7982 Long term (current) use of aspirin: Secondary | ICD-10-CM | POA: Diagnosis not present

## 2011-03-14 DIAGNOSIS — N200 Calculus of kidney: Secondary | ICD-10-CM | POA: Diagnosis not present

## 2011-03-14 DIAGNOSIS — Z510 Encounter for antineoplastic radiation therapy: Secondary | ICD-10-CM | POA: Diagnosis not present

## 2011-03-14 DIAGNOSIS — Z79899 Other long term (current) drug therapy: Secondary | ICD-10-CM | POA: Diagnosis not present

## 2011-03-14 DIAGNOSIS — E785 Hyperlipidemia, unspecified: Secondary | ICD-10-CM | POA: Diagnosis not present

## 2011-03-14 DIAGNOSIS — C259 Malignant neoplasm of pancreas, unspecified: Secondary | ICD-10-CM | POA: Diagnosis not present

## 2011-03-14 DIAGNOSIS — I2510 Atherosclerotic heart disease of native coronary artery without angina pectoris: Secondary | ICD-10-CM | POA: Diagnosis not present

## 2011-03-14 DIAGNOSIS — Z85820 Personal history of malignant melanoma of skin: Secondary | ICD-10-CM | POA: Diagnosis not present

## 2011-03-14 DIAGNOSIS — I10 Essential (primary) hypertension: Secondary | ICD-10-CM | POA: Diagnosis not present

## 2011-03-14 DIAGNOSIS — Z5111 Encounter for antineoplastic chemotherapy: Secondary | ICD-10-CM | POA: Diagnosis not present

## 2011-03-14 DIAGNOSIS — C250 Malignant neoplasm of head of pancreas: Secondary | ICD-10-CM | POA: Diagnosis not present

## 2011-03-14 DIAGNOSIS — K5900 Constipation, unspecified: Secondary | ICD-10-CM | POA: Diagnosis not present

## 2011-03-14 DIAGNOSIS — M1990 Unspecified osteoarthritis, unspecified site: Secondary | ICD-10-CM | POA: Diagnosis not present

## 2011-03-14 DIAGNOSIS — R5081 Fever presenting with conditions classified elsewhere: Secondary | ICD-10-CM | POA: Diagnosis not present

## 2011-03-16 DIAGNOSIS — N281 Cyst of kidney, acquired: Secondary | ICD-10-CM | POA: Diagnosis not present

## 2011-03-16 DIAGNOSIS — C259 Malignant neoplasm of pancreas, unspecified: Secondary | ICD-10-CM | POA: Diagnosis not present

## 2011-03-16 DIAGNOSIS — R109 Unspecified abdominal pain: Secondary | ICD-10-CM | POA: Diagnosis not present

## 2011-03-16 DIAGNOSIS — R509 Fever, unspecified: Secondary | ICD-10-CM | POA: Diagnosis not present

## 2011-03-16 DIAGNOSIS — K7689 Other specified diseases of liver: Secondary | ICD-10-CM | POA: Diagnosis not present

## 2011-03-16 DIAGNOSIS — R079 Chest pain, unspecified: Secondary | ICD-10-CM | POA: Diagnosis not present

## 2011-03-16 DIAGNOSIS — Z8509 Personal history of malignant neoplasm of other digestive organs: Secondary | ICD-10-CM | POA: Diagnosis not present

## 2011-03-17 DIAGNOSIS — N400 Enlarged prostate without lower urinary tract symptoms: Secondary | ICD-10-CM | POA: Diagnosis present

## 2011-03-17 DIAGNOSIS — M1990 Unspecified osteoarthritis, unspecified site: Secondary | ICD-10-CM | POA: Diagnosis not present

## 2011-03-17 DIAGNOSIS — N281 Cyst of kidney, acquired: Secondary | ICD-10-CM | POA: Diagnosis not present

## 2011-03-17 DIAGNOSIS — K5900 Constipation, unspecified: Secondary | ICD-10-CM | POA: Diagnosis present

## 2011-03-17 DIAGNOSIS — I10 Essential (primary) hypertension: Secondary | ICD-10-CM | POA: Diagnosis not present

## 2011-03-17 DIAGNOSIS — Z9889 Other specified postprocedural states: Secondary | ICD-10-CM | POA: Diagnosis not present

## 2011-03-17 DIAGNOSIS — Z7982 Long term (current) use of aspirin: Secondary | ICD-10-CM | POA: Diagnosis not present

## 2011-03-17 DIAGNOSIS — E785 Hyperlipidemia, unspecified: Secondary | ICD-10-CM | POA: Diagnosis present

## 2011-03-17 DIAGNOSIS — Z8509 Personal history of malignant neoplasm of other digestive organs: Secondary | ICD-10-CM | POA: Diagnosis not present

## 2011-03-17 DIAGNOSIS — Z951 Presence of aortocoronary bypass graft: Secondary | ICD-10-CM | POA: Diagnosis not present

## 2011-03-17 DIAGNOSIS — C259 Malignant neoplasm of pancreas, unspecified: Secondary | ICD-10-CM | POA: Diagnosis not present

## 2011-03-17 DIAGNOSIS — R079 Chest pain, unspecified: Secondary | ICD-10-CM | POA: Diagnosis not present

## 2011-03-17 DIAGNOSIS — R5081 Fever presenting with conditions classified elsewhere: Secondary | ICD-10-CM | POA: Diagnosis not present

## 2011-03-17 DIAGNOSIS — R109 Unspecified abdominal pain: Secondary | ICD-10-CM | POA: Diagnosis not present

## 2011-03-17 DIAGNOSIS — K7689 Other specified diseases of liver: Secondary | ICD-10-CM | POA: Diagnosis not present

## 2011-03-17 DIAGNOSIS — Z79899 Other long term (current) drug therapy: Secondary | ICD-10-CM | POA: Diagnosis not present

## 2011-03-17 DIAGNOSIS — E7800 Pure hypercholesterolemia, unspecified: Secondary | ICD-10-CM | POA: Diagnosis present

## 2011-03-17 DIAGNOSIS — I2510 Atherosclerotic heart disease of native coronary artery without angina pectoris: Secondary | ICD-10-CM | POA: Diagnosis present

## 2011-03-17 DIAGNOSIS — R509 Fever, unspecified: Secondary | ICD-10-CM | POA: Diagnosis not present

## 2011-03-17 DIAGNOSIS — R110 Nausea: Secondary | ICD-10-CM | POA: Diagnosis present

## 2011-03-17 DIAGNOSIS — E861 Hypovolemia: Secondary | ICD-10-CM | POA: Diagnosis present

## 2011-03-22 DIAGNOSIS — K529 Noninfective gastroenteritis and colitis, unspecified: Secondary | ICD-10-CM | POA: Diagnosis not present

## 2011-03-22 DIAGNOSIS — R1084 Generalized abdominal pain: Secondary | ICD-10-CM | POA: Diagnosis not present

## 2011-03-22 DIAGNOSIS — N529 Male erectile dysfunction, unspecified: Secondary | ICD-10-CM | POA: Diagnosis not present

## 2011-03-22 DIAGNOSIS — E782 Mixed hyperlipidemia: Secondary | ICD-10-CM | POA: Diagnosis not present

## 2011-03-22 DIAGNOSIS — J189 Pneumonia, unspecified organism: Secondary | ICD-10-CM | POA: Diagnosis not present

## 2011-03-25 DIAGNOSIS — E785 Hyperlipidemia, unspecified: Secondary | ICD-10-CM | POA: Diagnosis not present

## 2011-03-25 DIAGNOSIS — I2510 Atherosclerotic heart disease of native coronary artery without angina pectoris: Secondary | ICD-10-CM | POA: Diagnosis not present

## 2011-03-25 DIAGNOSIS — Z510 Encounter for antineoplastic radiation therapy: Secondary | ICD-10-CM | POA: Diagnosis not present

## 2011-03-25 DIAGNOSIS — Z5111 Encounter for antineoplastic chemotherapy: Secondary | ICD-10-CM | POA: Diagnosis not present

## 2011-03-25 DIAGNOSIS — C250 Malignant neoplasm of head of pancreas: Secondary | ICD-10-CM | POA: Diagnosis not present

## 2011-03-25 DIAGNOSIS — I10 Essential (primary) hypertension: Secondary | ICD-10-CM | POA: Diagnosis not present

## 2011-03-26 DIAGNOSIS — I10 Essential (primary) hypertension: Secondary | ICD-10-CM | POA: Diagnosis not present

## 2011-03-26 DIAGNOSIS — C250 Malignant neoplasm of head of pancreas: Secondary | ICD-10-CM | POA: Diagnosis not present

## 2011-03-26 DIAGNOSIS — I2510 Atherosclerotic heart disease of native coronary artery without angina pectoris: Secondary | ICD-10-CM | POA: Diagnosis not present

## 2011-03-26 DIAGNOSIS — Z85820 Personal history of malignant melanoma of skin: Secondary | ICD-10-CM | POA: Diagnosis not present

## 2011-03-26 DIAGNOSIS — E785 Hyperlipidemia, unspecified: Secondary | ICD-10-CM | POA: Diagnosis not present

## 2011-03-26 DIAGNOSIS — Z510 Encounter for antineoplastic radiation therapy: Secondary | ICD-10-CM | POA: Diagnosis not present

## 2011-03-26 DIAGNOSIS — Z5111 Encounter for antineoplastic chemotherapy: Secondary | ICD-10-CM | POA: Diagnosis not present

## 2011-03-26 DIAGNOSIS — N200 Calculus of kidney: Secondary | ICD-10-CM | POA: Diagnosis not present

## 2011-03-27 DIAGNOSIS — Z510 Encounter for antineoplastic radiation therapy: Secondary | ICD-10-CM | POA: Diagnosis not present

## 2011-03-27 DIAGNOSIS — I10 Essential (primary) hypertension: Secondary | ICD-10-CM | POA: Diagnosis not present

## 2011-03-27 DIAGNOSIS — Z5111 Encounter for antineoplastic chemotherapy: Secondary | ICD-10-CM | POA: Diagnosis not present

## 2011-03-27 DIAGNOSIS — C250 Malignant neoplasm of head of pancreas: Secondary | ICD-10-CM | POA: Diagnosis not present

## 2011-03-27 DIAGNOSIS — I2510 Atherosclerotic heart disease of native coronary artery without angina pectoris: Secondary | ICD-10-CM | POA: Diagnosis not present

## 2011-03-27 DIAGNOSIS — E785 Hyperlipidemia, unspecified: Secondary | ICD-10-CM | POA: Diagnosis not present

## 2011-03-28 DIAGNOSIS — I2510 Atherosclerotic heart disease of native coronary artery without angina pectoris: Secondary | ICD-10-CM | POA: Diagnosis not present

## 2011-03-28 DIAGNOSIS — I10 Essential (primary) hypertension: Secondary | ICD-10-CM | POA: Diagnosis not present

## 2011-03-28 DIAGNOSIS — Z510 Encounter for antineoplastic radiation therapy: Secondary | ICD-10-CM | POA: Diagnosis not present

## 2011-03-28 DIAGNOSIS — C250 Malignant neoplasm of head of pancreas: Secondary | ICD-10-CM | POA: Diagnosis not present

## 2011-03-28 DIAGNOSIS — Z5111 Encounter for antineoplastic chemotherapy: Secondary | ICD-10-CM | POA: Diagnosis not present

## 2011-03-28 DIAGNOSIS — E785 Hyperlipidemia, unspecified: Secondary | ICD-10-CM | POA: Diagnosis not present

## 2011-03-29 DIAGNOSIS — S0180XA Unspecified open wound of other part of head, initial encounter: Secondary | ICD-10-CM | POA: Diagnosis not present

## 2011-03-29 DIAGNOSIS — Z510 Encounter for antineoplastic radiation therapy: Secondary | ICD-10-CM | POA: Diagnosis not present

## 2011-03-29 DIAGNOSIS — C250 Malignant neoplasm of head of pancreas: Secondary | ICD-10-CM | POA: Diagnosis not present

## 2011-03-29 DIAGNOSIS — G609 Hereditary and idiopathic neuropathy, unspecified: Secondary | ICD-10-CM | POA: Diagnosis not present

## 2011-03-29 DIAGNOSIS — I2510 Atherosclerotic heart disease of native coronary artery without angina pectoris: Secondary | ICD-10-CM | POA: Diagnosis not present

## 2011-03-29 DIAGNOSIS — Z5111 Encounter for antineoplastic chemotherapy: Secondary | ICD-10-CM | POA: Diagnosis not present

## 2011-03-29 DIAGNOSIS — E785 Hyperlipidemia, unspecified: Secondary | ICD-10-CM | POA: Diagnosis not present

## 2011-03-29 DIAGNOSIS — I10 Essential (primary) hypertension: Secondary | ICD-10-CM | POA: Diagnosis not present

## 2011-03-29 DIAGNOSIS — Z7982 Long term (current) use of aspirin: Secondary | ICD-10-CM | POA: Diagnosis not present

## 2011-03-29 DIAGNOSIS — Z79899 Other long term (current) drug therapy: Secondary | ICD-10-CM | POA: Diagnosis not present

## 2011-03-29 DIAGNOSIS — E860 Dehydration: Secondary | ICD-10-CM | POA: Diagnosis not present

## 2011-04-01 DIAGNOSIS — S0180XA Unspecified open wound of other part of head, initial encounter: Secondary | ICD-10-CM | POA: Diagnosis not present

## 2011-04-01 DIAGNOSIS — Z5111 Encounter for antineoplastic chemotherapy: Secondary | ICD-10-CM | POA: Diagnosis not present

## 2011-04-01 DIAGNOSIS — I10 Essential (primary) hypertension: Secondary | ICD-10-CM | POA: Diagnosis not present

## 2011-04-01 DIAGNOSIS — Z510 Encounter for antineoplastic radiation therapy: Secondary | ICD-10-CM | POA: Diagnosis not present

## 2011-04-01 DIAGNOSIS — C250 Malignant neoplasm of head of pancreas: Secondary | ICD-10-CM | POA: Diagnosis not present

## 2011-04-01 DIAGNOSIS — E860 Dehydration: Secondary | ICD-10-CM | POA: Diagnosis not present

## 2011-04-02 DIAGNOSIS — E860 Dehydration: Secondary | ICD-10-CM | POA: Diagnosis not present

## 2011-04-02 DIAGNOSIS — I10 Essential (primary) hypertension: Secondary | ICD-10-CM | POA: Diagnosis not present

## 2011-04-02 DIAGNOSIS — S0180XA Unspecified open wound of other part of head, initial encounter: Secondary | ICD-10-CM | POA: Diagnosis not present

## 2011-04-02 DIAGNOSIS — Z5111 Encounter for antineoplastic chemotherapy: Secondary | ICD-10-CM | POA: Diagnosis not present

## 2011-04-02 DIAGNOSIS — Z510 Encounter for antineoplastic radiation therapy: Secondary | ICD-10-CM | POA: Diagnosis not present

## 2011-04-02 DIAGNOSIS — C250 Malignant neoplasm of head of pancreas: Secondary | ICD-10-CM | POA: Diagnosis not present

## 2011-04-04 DIAGNOSIS — S0180XA Unspecified open wound of other part of head, initial encounter: Secondary | ICD-10-CM | POA: Diagnosis not present

## 2011-04-04 DIAGNOSIS — I10 Essential (primary) hypertension: Secondary | ICD-10-CM | POA: Diagnosis not present

## 2011-04-04 DIAGNOSIS — C250 Malignant neoplasm of head of pancreas: Secondary | ICD-10-CM | POA: Diagnosis not present

## 2011-04-04 DIAGNOSIS — E860 Dehydration: Secondary | ICD-10-CM | POA: Diagnosis not present

## 2011-04-04 DIAGNOSIS — Z5111 Encounter for antineoplastic chemotherapy: Secondary | ICD-10-CM | POA: Diagnosis not present

## 2011-04-04 DIAGNOSIS — Z510 Encounter for antineoplastic radiation therapy: Secondary | ICD-10-CM | POA: Diagnosis not present

## 2011-04-05 DIAGNOSIS — Z510 Encounter for antineoplastic radiation therapy: Secondary | ICD-10-CM | POA: Diagnosis not present

## 2011-04-05 DIAGNOSIS — Z5111 Encounter for antineoplastic chemotherapy: Secondary | ICD-10-CM | POA: Diagnosis not present

## 2011-04-05 DIAGNOSIS — C250 Malignant neoplasm of head of pancreas: Secondary | ICD-10-CM | POA: Diagnosis not present

## 2011-04-05 DIAGNOSIS — E860 Dehydration: Secondary | ICD-10-CM | POA: Diagnosis not present

## 2011-04-05 DIAGNOSIS — S0180XA Unspecified open wound of other part of head, initial encounter: Secondary | ICD-10-CM | POA: Diagnosis not present

## 2011-04-05 DIAGNOSIS — I10 Essential (primary) hypertension: Secondary | ICD-10-CM | POA: Diagnosis not present

## 2011-04-08 DIAGNOSIS — S0180XA Unspecified open wound of other part of head, initial encounter: Secondary | ICD-10-CM | POA: Diagnosis not present

## 2011-04-08 DIAGNOSIS — Z5111 Encounter for antineoplastic chemotherapy: Secondary | ICD-10-CM | POA: Diagnosis not present

## 2011-04-08 DIAGNOSIS — E860 Dehydration: Secondary | ICD-10-CM | POA: Diagnosis not present

## 2011-04-08 DIAGNOSIS — Z79899 Other long term (current) drug therapy: Secondary | ICD-10-CM | POA: Diagnosis not present

## 2011-04-08 DIAGNOSIS — I10 Essential (primary) hypertension: Secondary | ICD-10-CM | POA: Diagnosis not present

## 2011-04-08 DIAGNOSIS — I2510 Atherosclerotic heart disease of native coronary artery without angina pectoris: Secondary | ICD-10-CM | POA: Diagnosis not present

## 2011-04-08 DIAGNOSIS — Z510 Encounter for antineoplastic radiation therapy: Secondary | ICD-10-CM | POA: Diagnosis not present

## 2011-04-08 DIAGNOSIS — C250 Malignant neoplasm of head of pancreas: Secondary | ICD-10-CM | POA: Diagnosis not present

## 2011-04-09 DIAGNOSIS — C250 Malignant neoplasm of head of pancreas: Secondary | ICD-10-CM | POA: Diagnosis not present

## 2011-04-09 DIAGNOSIS — Z510 Encounter for antineoplastic radiation therapy: Secondary | ICD-10-CM | POA: Diagnosis not present

## 2011-04-09 DIAGNOSIS — Z5111 Encounter for antineoplastic chemotherapy: Secondary | ICD-10-CM | POA: Diagnosis not present

## 2011-04-09 DIAGNOSIS — S0180XA Unspecified open wound of other part of head, initial encounter: Secondary | ICD-10-CM | POA: Diagnosis not present

## 2011-04-09 DIAGNOSIS — I10 Essential (primary) hypertension: Secondary | ICD-10-CM | POA: Diagnosis not present

## 2011-04-09 DIAGNOSIS — E860 Dehydration: Secondary | ICD-10-CM | POA: Diagnosis not present

## 2011-04-11 DIAGNOSIS — Z510 Encounter for antineoplastic radiation therapy: Secondary | ICD-10-CM | POA: Diagnosis not present

## 2011-04-11 DIAGNOSIS — C250 Malignant neoplasm of head of pancreas: Secondary | ICD-10-CM | POA: Diagnosis not present

## 2011-04-11 DIAGNOSIS — E860 Dehydration: Secondary | ICD-10-CM | POA: Diagnosis not present

## 2011-04-11 DIAGNOSIS — Z5111 Encounter for antineoplastic chemotherapy: Secondary | ICD-10-CM | POA: Diagnosis not present

## 2011-04-11 DIAGNOSIS — I10 Essential (primary) hypertension: Secondary | ICD-10-CM | POA: Diagnosis not present

## 2011-04-11 DIAGNOSIS — S0180XA Unspecified open wound of other part of head, initial encounter: Secondary | ICD-10-CM | POA: Diagnosis not present

## 2011-04-12 DIAGNOSIS — Z5111 Encounter for antineoplastic chemotherapy: Secondary | ICD-10-CM | POA: Diagnosis not present

## 2011-04-12 DIAGNOSIS — I10 Essential (primary) hypertension: Secondary | ICD-10-CM | POA: Diagnosis not present

## 2011-04-12 DIAGNOSIS — C250 Malignant neoplasm of head of pancreas: Secondary | ICD-10-CM | POA: Diagnosis not present

## 2011-04-12 DIAGNOSIS — Z510 Encounter for antineoplastic radiation therapy: Secondary | ICD-10-CM | POA: Diagnosis not present

## 2011-04-12 DIAGNOSIS — S0180XA Unspecified open wound of other part of head, initial encounter: Secondary | ICD-10-CM | POA: Diagnosis not present

## 2011-04-12 DIAGNOSIS — E860 Dehydration: Secondary | ICD-10-CM | POA: Diagnosis not present

## 2011-04-15 DIAGNOSIS — E860 Dehydration: Secondary | ICD-10-CM | POA: Diagnosis not present

## 2011-04-15 DIAGNOSIS — S0180XA Unspecified open wound of other part of head, initial encounter: Secondary | ICD-10-CM | POA: Diagnosis not present

## 2011-04-15 DIAGNOSIS — I2510 Atherosclerotic heart disease of native coronary artery without angina pectoris: Secondary | ICD-10-CM | POA: Diagnosis not present

## 2011-04-15 DIAGNOSIS — C250 Malignant neoplasm of head of pancreas: Secondary | ICD-10-CM | POA: Diagnosis not present

## 2011-04-15 DIAGNOSIS — Z5111 Encounter for antineoplastic chemotherapy: Secondary | ICD-10-CM | POA: Diagnosis not present

## 2011-04-15 DIAGNOSIS — Z79899 Other long term (current) drug therapy: Secondary | ICD-10-CM | POA: Diagnosis not present

## 2011-04-15 DIAGNOSIS — I10 Essential (primary) hypertension: Secondary | ICD-10-CM | POA: Diagnosis not present

## 2011-04-15 DIAGNOSIS — Z510 Encounter for antineoplastic radiation therapy: Secondary | ICD-10-CM | POA: Diagnosis not present

## 2011-04-18 DIAGNOSIS — C250 Malignant neoplasm of head of pancreas: Secondary | ICD-10-CM | POA: Diagnosis not present

## 2011-04-18 DIAGNOSIS — S0180XA Unspecified open wound of other part of head, initial encounter: Secondary | ICD-10-CM | POA: Diagnosis not present

## 2011-04-18 DIAGNOSIS — I10 Essential (primary) hypertension: Secondary | ICD-10-CM | POA: Diagnosis not present

## 2011-04-18 DIAGNOSIS — Z510 Encounter for antineoplastic radiation therapy: Secondary | ICD-10-CM | POA: Diagnosis not present

## 2011-04-18 DIAGNOSIS — E860 Dehydration: Secondary | ICD-10-CM | POA: Diagnosis not present

## 2011-04-18 DIAGNOSIS — Z5111 Encounter for antineoplastic chemotherapy: Secondary | ICD-10-CM | POA: Diagnosis not present

## 2011-04-19 DIAGNOSIS — I10 Essential (primary) hypertension: Secondary | ICD-10-CM | POA: Diagnosis not present

## 2011-04-19 DIAGNOSIS — Z510 Encounter for antineoplastic radiation therapy: Secondary | ICD-10-CM | POA: Diagnosis not present

## 2011-04-19 DIAGNOSIS — R1084 Generalized abdominal pain: Secondary | ICD-10-CM | POA: Diagnosis not present

## 2011-04-19 DIAGNOSIS — E860 Dehydration: Secondary | ICD-10-CM | POA: Diagnosis not present

## 2011-04-19 DIAGNOSIS — C250 Malignant neoplasm of head of pancreas: Secondary | ICD-10-CM | POA: Diagnosis not present

## 2011-04-19 DIAGNOSIS — Z5111 Encounter for antineoplastic chemotherapy: Secondary | ICD-10-CM | POA: Diagnosis not present

## 2011-04-19 DIAGNOSIS — I2510 Atherosclerotic heart disease of native coronary artery without angina pectoris: Secondary | ICD-10-CM | POA: Diagnosis not present

## 2011-04-19 DIAGNOSIS — Z0000 Encounter for general adult medical examination without abnormal findings: Secondary | ICD-10-CM | POA: Diagnosis not present

## 2011-04-19 DIAGNOSIS — IMO0002 Reserved for concepts with insufficient information to code with codable children: Secondary | ICD-10-CM | POA: Diagnosis not present

## 2011-04-19 DIAGNOSIS — E782 Mixed hyperlipidemia: Secondary | ICD-10-CM | POA: Diagnosis not present

## 2011-04-19 DIAGNOSIS — S0180XA Unspecified open wound of other part of head, initial encounter: Secondary | ICD-10-CM | POA: Diagnosis not present

## 2011-04-19 DIAGNOSIS — K529 Noninfective gastroenteritis and colitis, unspecified: Secondary | ICD-10-CM | POA: Diagnosis not present

## 2011-04-19 DIAGNOSIS — Z79899 Other long term (current) drug therapy: Secondary | ICD-10-CM | POA: Diagnosis not present

## 2011-04-19 DIAGNOSIS — J189 Pneumonia, unspecified organism: Secondary | ICD-10-CM | POA: Diagnosis not present

## 2011-04-19 DIAGNOSIS — N529 Male erectile dysfunction, unspecified: Secondary | ICD-10-CM | POA: Diagnosis not present

## 2011-04-22 DIAGNOSIS — I2510 Atherosclerotic heart disease of native coronary artery without angina pectoris: Secondary | ICD-10-CM | POA: Diagnosis not present

## 2011-04-22 DIAGNOSIS — I10 Essential (primary) hypertension: Secondary | ICD-10-CM | POA: Diagnosis not present

## 2011-04-22 DIAGNOSIS — Z510 Encounter for antineoplastic radiation therapy: Secondary | ICD-10-CM | POA: Diagnosis not present

## 2011-04-22 DIAGNOSIS — Z5111 Encounter for antineoplastic chemotherapy: Secondary | ICD-10-CM | POA: Diagnosis not present

## 2011-04-22 DIAGNOSIS — S0180XA Unspecified open wound of other part of head, initial encounter: Secondary | ICD-10-CM | POA: Diagnosis not present

## 2011-04-22 DIAGNOSIS — Z79899 Other long term (current) drug therapy: Secondary | ICD-10-CM | POA: Diagnosis not present

## 2011-04-22 DIAGNOSIS — E860 Dehydration: Secondary | ICD-10-CM | POA: Diagnosis not present

## 2011-04-22 DIAGNOSIS — C250 Malignant neoplasm of head of pancreas: Secondary | ICD-10-CM | POA: Diagnosis not present

## 2011-04-29 DIAGNOSIS — R1084 Generalized abdominal pain: Secondary | ICD-10-CM | POA: Diagnosis not present

## 2011-04-29 DIAGNOSIS — K529 Noninfective gastroenteritis and colitis, unspecified: Secondary | ICD-10-CM | POA: Diagnosis not present

## 2011-04-29 DIAGNOSIS — N529 Male erectile dysfunction, unspecified: Secondary | ICD-10-CM | POA: Diagnosis not present

## 2011-04-29 DIAGNOSIS — R5381 Other malaise: Secondary | ICD-10-CM | POA: Diagnosis not present

## 2011-04-29 DIAGNOSIS — J189 Pneumonia, unspecified organism: Secondary | ICD-10-CM | POA: Diagnosis not present

## 2011-04-29 DIAGNOSIS — E782 Mixed hyperlipidemia: Secondary | ICD-10-CM | POA: Diagnosis not present

## 2011-05-03 ENCOUNTER — Ambulatory Visit (INDEPENDENT_AMBULATORY_CARE_PROVIDER_SITE_OTHER): Payer: Medicare Other | Admitting: Cardiology

## 2011-05-03 ENCOUNTER — Encounter: Payer: Self-pay | Admitting: Cardiology

## 2011-05-03 VITALS — BP 152/88 | HR 100 | Ht 68.0 in | Wt 144.4 lb

## 2011-05-03 DIAGNOSIS — R002 Palpitations: Secondary | ICD-10-CM | POA: Diagnosis not present

## 2011-05-03 DIAGNOSIS — I2510 Atherosclerotic heart disease of native coronary artery without angina pectoris: Secondary | ICD-10-CM

## 2011-05-03 DIAGNOSIS — I10 Essential (primary) hypertension: Secondary | ICD-10-CM | POA: Diagnosis not present

## 2011-05-03 DIAGNOSIS — R079 Chest pain, unspecified: Secondary | ICD-10-CM | POA: Insufficient documentation

## 2011-05-03 DIAGNOSIS — C259 Malignant neoplasm of pancreas, unspecified: Secondary | ICD-10-CM

## 2011-05-03 NOTE — Assessment & Plan Note (Signed)
The patient's cardiac status is stable. He is not having any symptoms at this time that I feel are compatible with his coronary status. No further workup.

## 2011-05-03 NOTE — Patient Instructions (Signed)
Your physician wants you to follow-up in: 6 months. You will receive a reminder letter in the mail one-two months in advance. If you don't receive a letter, please call our office to schedule the follow-up appointment. Your physician recommends that you continue on your current medications as directed. Please refer to the Current Medication list given to you today. 

## 2011-05-03 NOTE — Assessment & Plan Note (Signed)
Systolic blood pressure is slightly elevated today. This is not significant. No further change in his medications.

## 2011-05-03 NOTE — Progress Notes (Signed)
HPI  Patient is seen for cardiology followup. I had seen him in October, 2012 to clear him for pancreatic surgery. He does have coronary disease that has been stable. He underwent a major abdominal procedure. He tells me that he did have pancreatic cancer. All of the mass was removed. He had one positive node. Chemotherapy was recommended and he did receive it for a while. He had a terrible quality of life and decided to stop chemotherapy. He feels great at this time.  He is not having any chest pain. He's not having any significant shortness of breath. He's had some unusual symptoms recently that do not sound cardiac. He lays down at night and has some discomfort in his chest. He's not short of breath. He sits up and it is relieved. He is unable to lie down again with no difficulties. This does not sound like ischemia, pulmonary embolus, or pericarditis.  Allergies  Allergen Reactions  . Codeine Itching and Swelling  . Meperidine Hcl Itching and Swelling  . Metoclopramide Hcl Itching and Swelling  . Other     VISTAID ANESTHESIA  . Percocet (Oxycodone-Acetaminophen) Itching and Swelling  . Prochlorperazine Edisylate Other (See Comments)    Unsure of reaction    Current Outpatient Prescriptions  Medication Sig Dispense Refill  . amitriptyline (ELAVIL) 25 MG tablet Take 75 mg by mouth at bedtime.       Marland Kitchen aspirin 81 MG tablet Take 81 mg by mouth daily.      Marland Kitchen atorvastatin (LIPITOR) 10 MG tablet Take 10 mg by mouth daily.        . Cholecalciferol (VITAMIN D3) 400 UNITS CAPS Take 1 capsule by mouth daily.        . fexofenadine (ALLEGRA) 180 MG tablet Take 180 mg by mouth daily.        Marland Kitchen gabapentin (NEURONTIN) 600 MG tablet Take 600 mg by mouth 4 (four) times daily.       . isosorbide mononitrate (IMDUR) 30 MG 24 hr tablet TAKE ONE TABLET BY MOUTH EVERY DAY  90 tablet  3  . megestrol (MEGACE) 400 MG/10ML suspension Take 200 mg by mouth daily.      . Pancrelipase, Lip-Prot-Amyl, (CREON)  24000 UNITS CPEP Take 2 capsules by mouth 3 (three) times daily before meals. And snacks.      Bertram Gala Glycol-Propyl Glycol (SYSTANE) 0.4-0.3 % GEL Apply 1 drop to eye as needed. For irritated eyes       . Tamsulosin HCl (FLOMAX) 0.4 MG CAPS Take 1 tablet by mouth Daily.      . metoprolol tartrate (LOPRESSOR) 25 MG tablet Take 25 mg by mouth every evening.       . nitroGLYCERIN (NITROSTAT) 0.4 MG SL tablet Place 0.4 mg under the tongue every 5 (five) minutes as needed. For chest pain.       Marland Kitchen ondansetron (ZOFRAN) 8 MG tablet Take 8 mg by mouth every 8 (eight) hours as needed. For nausea.       Marland Kitchen oxyCODONE (OXY IR/ROXICODONE) 5 MG immediate release tablet Take 5 mg by mouth every 4 (four) hours as needed. For pain.       . promethazine (PHENERGAN) 25 MG tablet Take 25 mg by mouth every 6 (six) hours as needed. For nausea        . VOLTAREN 1 % GEL Apply 1 application topically as needed. For knee pain.        History   Social History  .  Marital Status: Married    Spouse Name: N/A    Number of Children: N/A  . Years of Education: N/A   Occupational History  . RETIRED    Social History Main Topics  . Smoking status: Never Smoker   . Smokeless tobacco: Never Used   Comment: tobacco use - no  . Alcohol Use: No  . Drug Use: No  . Sexually Active: Not on file   Other Topics Concern  . Not on file   Social History Narrative   Married.     Family History  Problem Relation Age of Onset  . Coronary artery disease      family hx    Past Medical History  Diagnosis Date  . CAD (coronary artery disease)     Catheterization 2006, patent grafts, possible ischemia distal circumflex not amenable to PCI  /   nuclear, June, 2010, possible mild inferobasal ischemia, low risk, EF 52%  . Cervical disc disease   . Hypertension   . Dyslipidemia   . Carotid artery disease     Doppler, June, 2010, less than 50% bilateral  . [facility]     [facility]  . Ejection fraction     52%,  nuclear, 2010  . Palpitations   . Hypertension     x 15 yrs.  . Arthritis   . Neuropathy   . Preop cardiovascular exam     Cardiac clearance for pancreatic surgery November, 2012    Past Surgical History  Procedure Date  . Coronary artery bypass graft 1995    [facility])  . Back surgery   . Coronary artery bypass graft     with 4 vessel bypass in 1995  . Cervical disc surgery     x 2 in 1997 and 2010  . Back surgery     x 2. One 8 yrs ago and one in 1980    ROS  Patient denies fever, chills, headache, sweats, rash, change in vision, change in hearing, cough, nausea vomiting, urinary symptoms. All other systems are reviewed and are negative.  PHYSICAL EXAM  He really looks quite good. There is no jugular venous distention. Lungs are clear. Respiratory effort is nonlabored. Cardiac exam reveals S1 and S2. There are no clicks or significant murmurs. The abdomen is soft. There is no peripheral edema. There is no chest wall tenderness. There are no pericardial rubs.  Filed Vitals:   05/03/11 1329  BP: 152/88  Pulse: 100  Height: 5\' 8"  (1.727 m)  Weight: 144 lb 6.4 oz (65.499 kg)  SpO2: 98%   EKG is done today and reviewed by me. There is no significant change. I have compared it to the old EKG. There is no diagnostic change.  ASSESSMENT & PLAN

## 2011-05-03 NOTE — Assessment & Plan Note (Signed)
This is a new diagnosis since seeing him last. We did know that he needed a procedure was surgery for his pancreas. He does have pancreatic cancer. He had one positive node. He received chemotherapy. He felt quite poorly and decided to stop chemotherapy. I discussed this with him at length. He has a very good outlook about his life.

## 2011-05-03 NOTE — Assessment & Plan Note (Signed)
He is not having any significant palpitations. No further workup. 

## 2011-05-03 NOTE — Assessment & Plan Note (Signed)
He describes some unusual chest discomfort in the last few nights. It occurs when he lays down at night. He sits up and feels better and then he can line down again. This does not appear to be consistent with pericarditis or ischemia or pulmonary embolus. His EKG shows no diagnostic change. I encouraged him to just follow this over time. He is going to try lying down with his position different in bed.

## 2011-05-14 DIAGNOSIS — Z9221 Personal history of antineoplastic chemotherapy: Secondary | ICD-10-CM | POA: Diagnosis not present

## 2011-05-14 DIAGNOSIS — Z923 Personal history of irradiation: Secondary | ICD-10-CM | POA: Diagnosis not present

## 2011-05-14 DIAGNOSIS — Z7982 Long term (current) use of aspirin: Secondary | ICD-10-CM | POA: Diagnosis not present

## 2011-05-14 DIAGNOSIS — C259 Malignant neoplasm of pancreas, unspecified: Secondary | ICD-10-CM | POA: Diagnosis not present

## 2011-05-14 DIAGNOSIS — Z79899 Other long term (current) drug therapy: Secondary | ICD-10-CM | POA: Diagnosis not present

## 2011-05-14 DIAGNOSIS — C772 Secondary and unspecified malignant neoplasm of intra-abdominal lymph nodes: Secondary | ICD-10-CM | POA: Diagnosis not present

## 2011-05-14 DIAGNOSIS — I2510 Atherosclerotic heart disease of native coronary artery without angina pectoris: Secondary | ICD-10-CM | POA: Diagnosis not present

## 2011-05-16 DIAGNOSIS — Z923 Personal history of irradiation: Secondary | ICD-10-CM | POA: Diagnosis not present

## 2011-05-16 DIAGNOSIS — C772 Secondary and unspecified malignant neoplasm of intra-abdominal lymph nodes: Secondary | ICD-10-CM | POA: Diagnosis not present

## 2011-05-16 DIAGNOSIS — Z79899 Other long term (current) drug therapy: Secondary | ICD-10-CM | POA: Diagnosis not present

## 2011-05-16 DIAGNOSIS — Z7982 Long term (current) use of aspirin: Secondary | ICD-10-CM | POA: Diagnosis not present

## 2011-05-16 DIAGNOSIS — C259 Malignant neoplasm of pancreas, unspecified: Secondary | ICD-10-CM | POA: Diagnosis not present

## 2011-05-16 DIAGNOSIS — Z9221 Personal history of antineoplastic chemotherapy: Secondary | ICD-10-CM | POA: Diagnosis not present

## 2011-05-16 DIAGNOSIS — I2510 Atherosclerotic heart disease of native coronary artery without angina pectoris: Secondary | ICD-10-CM | POA: Diagnosis not present

## 2011-05-22 DIAGNOSIS — IMO0002 Reserved for concepts with insufficient information to code with codable children: Secondary | ICD-10-CM | POA: Diagnosis not present

## 2011-05-22 DIAGNOSIS — R1084 Generalized abdominal pain: Secondary | ICD-10-CM | POA: Diagnosis not present

## 2011-05-22 DIAGNOSIS — R5383 Other fatigue: Secondary | ICD-10-CM | POA: Diagnosis not present

## 2011-05-22 DIAGNOSIS — K529 Noninfective gastroenteritis and colitis, unspecified: Secondary | ICD-10-CM | POA: Diagnosis not present

## 2011-05-22 DIAGNOSIS — J189 Pneumonia, unspecified organism: Secondary | ICD-10-CM | POA: Diagnosis not present

## 2011-05-22 DIAGNOSIS — E782 Mixed hyperlipidemia: Secondary | ICD-10-CM | POA: Diagnosis not present

## 2011-05-22 DIAGNOSIS — N529 Male erectile dysfunction, unspecified: Secondary | ICD-10-CM | POA: Diagnosis not present

## 2011-05-22 DIAGNOSIS — Z0000 Encounter for general adult medical examination without abnormal findings: Secondary | ICD-10-CM | POA: Diagnosis not present

## 2011-05-22 DIAGNOSIS — R5381 Other malaise: Secondary | ICD-10-CM | POA: Diagnosis not present

## 2011-05-30 DIAGNOSIS — D490 Neoplasm of unspecified behavior of digestive system: Secondary | ICD-10-CM | POA: Diagnosis not present

## 2011-05-30 DIAGNOSIS — I2510 Atherosclerotic heart disease of native coronary artery without angina pectoris: Secondary | ICD-10-CM | POA: Diagnosis not present

## 2011-05-30 DIAGNOSIS — I10 Essential (primary) hypertension: Secondary | ICD-10-CM | POA: Diagnosis not present

## 2011-05-30 DIAGNOSIS — C250 Malignant neoplasm of head of pancreas: Secondary | ICD-10-CM | POA: Diagnosis not present

## 2011-05-30 DIAGNOSIS — Z79899 Other long term (current) drug therapy: Secondary | ICD-10-CM | POA: Diagnosis not present

## 2011-05-30 DIAGNOSIS — K9089 Other intestinal malabsorption: Secondary | ICD-10-CM | POA: Diagnosis not present

## 2011-06-06 DIAGNOSIS — N200 Calculus of kidney: Secondary | ICD-10-CM | POA: Diagnosis not present

## 2011-06-06 DIAGNOSIS — K862 Cyst of pancreas: Secondary | ICD-10-CM | POA: Diagnosis not present

## 2011-06-06 DIAGNOSIS — D019 Carcinoma in situ of digestive organ, unspecified: Secondary | ICD-10-CM | POA: Diagnosis not present

## 2011-06-06 DIAGNOSIS — N289 Disorder of kidney and ureter, unspecified: Secondary | ICD-10-CM | POA: Diagnosis not present

## 2011-06-06 DIAGNOSIS — Z85820 Personal history of malignant melanoma of skin: Secondary | ICD-10-CM | POA: Diagnosis not present

## 2011-06-24 DIAGNOSIS — C259 Malignant neoplasm of pancreas, unspecified: Secondary | ICD-10-CM | POA: Diagnosis not present

## 2011-06-24 DIAGNOSIS — Z923 Personal history of irradiation: Secondary | ICD-10-CM | POA: Diagnosis not present

## 2011-06-24 DIAGNOSIS — Z7982 Long term (current) use of aspirin: Secondary | ICD-10-CM | POA: Diagnosis not present

## 2011-06-24 DIAGNOSIS — Z9221 Personal history of antineoplastic chemotherapy: Secondary | ICD-10-CM | POA: Diagnosis not present

## 2011-06-24 DIAGNOSIS — G609 Hereditary and idiopathic neuropathy, unspecified: Secondary | ICD-10-CM | POA: Diagnosis not present

## 2011-06-24 DIAGNOSIS — Z79899 Other long term (current) drug therapy: Secondary | ICD-10-CM | POA: Diagnosis not present

## 2011-06-25 ENCOUNTER — Encounter (INDEPENDENT_AMBULATORY_CARE_PROVIDER_SITE_OTHER): Payer: Self-pay

## 2011-06-27 DIAGNOSIS — C259 Malignant neoplasm of pancreas, unspecified: Secondary | ICD-10-CM | POA: Diagnosis not present

## 2011-06-27 DIAGNOSIS — K909 Intestinal malabsorption, unspecified: Secondary | ICD-10-CM | POA: Diagnosis not present

## 2011-06-27 DIAGNOSIS — Z923 Personal history of irradiation: Secondary | ICD-10-CM | POA: Diagnosis not present

## 2011-06-27 DIAGNOSIS — N200 Calculus of kidney: Secondary | ICD-10-CM | POA: Diagnosis not present

## 2011-06-27 DIAGNOSIS — Z7982 Long term (current) use of aspirin: Secondary | ICD-10-CM | POA: Diagnosis not present

## 2011-06-27 DIAGNOSIS — C772 Secondary and unspecified malignant neoplasm of intra-abdominal lymph nodes: Secondary | ICD-10-CM | POA: Diagnosis not present

## 2011-06-27 DIAGNOSIS — Z9221 Personal history of antineoplastic chemotherapy: Secondary | ICD-10-CM | POA: Diagnosis not present

## 2011-06-27 DIAGNOSIS — I2510 Atherosclerotic heart disease of native coronary artery without angina pectoris: Secondary | ICD-10-CM | POA: Diagnosis not present

## 2011-06-27 DIAGNOSIS — Z90411 Acquired partial absence of pancreas: Secondary | ICD-10-CM | POA: Diagnosis not present

## 2011-06-27 DIAGNOSIS — Z79899 Other long term (current) drug therapy: Secondary | ICD-10-CM | POA: Diagnosis not present

## 2011-06-28 DIAGNOSIS — E785 Hyperlipidemia, unspecified: Secondary | ICD-10-CM | POA: Diagnosis not present

## 2011-06-28 DIAGNOSIS — Z888 Allergy status to other drugs, medicaments and biological substances status: Secondary | ICD-10-CM | POA: Diagnosis not present

## 2011-06-28 DIAGNOSIS — I4892 Unspecified atrial flutter: Secondary | ICD-10-CM | POA: Diagnosis not present

## 2011-06-28 DIAGNOSIS — Z7982 Long term (current) use of aspirin: Secondary | ICD-10-CM | POA: Diagnosis not present

## 2011-06-28 DIAGNOSIS — N400 Enlarged prostate without lower urinary tract symptoms: Secondary | ICD-10-CM | POA: Diagnosis not present

## 2011-06-28 DIAGNOSIS — Z923 Personal history of irradiation: Secondary | ICD-10-CM | POA: Diagnosis not present

## 2011-06-28 DIAGNOSIS — R0789 Other chest pain: Secondary | ICD-10-CM | POA: Diagnosis not present

## 2011-06-28 DIAGNOSIS — Z9221 Personal history of antineoplastic chemotherapy: Secondary | ICD-10-CM | POA: Diagnosis not present

## 2011-06-28 DIAGNOSIS — Z885 Allergy status to narcotic agent status: Secondary | ICD-10-CM | POA: Diagnosis not present

## 2011-06-28 DIAGNOSIS — Z79899 Other long term (current) drug therapy: Secondary | ICD-10-CM | POA: Diagnosis not present

## 2011-06-28 DIAGNOSIS — I2510 Atherosclerotic heart disease of native coronary artery without angina pectoris: Secondary | ICD-10-CM | POA: Diagnosis not present

## 2011-06-28 DIAGNOSIS — R0602 Shortness of breath: Secondary | ICD-10-CM | POA: Diagnosis not present

## 2011-06-28 DIAGNOSIS — M1990 Unspecified osteoarthritis, unspecified site: Secondary | ICD-10-CM | POA: Diagnosis not present

## 2011-06-28 DIAGNOSIS — Z951 Presence of aortocoronary bypass graft: Secondary | ICD-10-CM | POA: Diagnosis not present

## 2011-06-28 DIAGNOSIS — R079 Chest pain, unspecified: Secondary | ICD-10-CM | POA: Diagnosis not present

## 2011-06-28 DIAGNOSIS — Z8249 Family history of ischemic heart disease and other diseases of the circulatory system: Secondary | ICD-10-CM | POA: Diagnosis not present

## 2011-06-28 DIAGNOSIS — I10 Essential (primary) hypertension: Secondary | ICD-10-CM | POA: Diagnosis not present

## 2011-06-28 DIAGNOSIS — Z8509 Personal history of malignant neoplasm of other digestive organs: Secondary | ICD-10-CM | POA: Diagnosis not present

## 2011-06-29 DIAGNOSIS — R079 Chest pain, unspecified: Secondary | ICD-10-CM | POA: Diagnosis not present

## 2011-06-29 DIAGNOSIS — N400 Enlarged prostate without lower urinary tract symptoms: Secondary | ICD-10-CM | POA: Diagnosis not present

## 2011-06-29 DIAGNOSIS — R0602 Shortness of breath: Secondary | ICD-10-CM | POA: Diagnosis not present

## 2011-06-29 DIAGNOSIS — I4892 Unspecified atrial flutter: Secondary | ICD-10-CM | POA: Diagnosis not present

## 2011-06-29 DIAGNOSIS — R0789 Other chest pain: Secondary | ICD-10-CM | POA: Diagnosis not present

## 2011-06-29 DIAGNOSIS — I10 Essential (primary) hypertension: Secondary | ICD-10-CM | POA: Diagnosis not present

## 2011-06-29 DIAGNOSIS — I2510 Atherosclerotic heart disease of native coronary artery without angina pectoris: Secondary | ICD-10-CM | POA: Diagnosis not present

## 2011-07-08 DIAGNOSIS — I10 Essential (primary) hypertension: Secondary | ICD-10-CM | POA: Diagnosis not present

## 2011-07-08 DIAGNOSIS — R5381 Other malaise: Secondary | ICD-10-CM | POA: Diagnosis not present

## 2011-07-08 DIAGNOSIS — I6350 Cerebral infarction due to unspecified occlusion or stenosis of unspecified cerebral artery: Secondary | ICD-10-CM | POA: Diagnosis not present

## 2011-07-08 DIAGNOSIS — I6529 Occlusion and stenosis of unspecified carotid artery: Secondary | ICD-10-CM | POA: Diagnosis not present

## 2011-07-08 DIAGNOSIS — Z8509 Personal history of malignant neoplasm of other digestive organs: Secondary | ICD-10-CM | POA: Diagnosis not present

## 2011-07-08 DIAGNOSIS — Z79899 Other long term (current) drug therapy: Secondary | ICD-10-CM | POA: Diagnosis not present

## 2011-07-08 DIAGNOSIS — Z886 Allergy status to analgesic agent status: Secondary | ICD-10-CM | POA: Diagnosis not present

## 2011-07-08 DIAGNOSIS — F29 Unspecified psychosis not due to a substance or known physiological condition: Secondary | ICD-10-CM | POA: Diagnosis not present

## 2011-07-08 DIAGNOSIS — R079 Chest pain, unspecified: Secondary | ICD-10-CM | POA: Diagnosis not present

## 2011-07-08 DIAGNOSIS — G459 Transient cerebral ischemic attack, unspecified: Secondary | ICD-10-CM | POA: Diagnosis not present

## 2011-07-08 DIAGNOSIS — C259 Malignant neoplasm of pancreas, unspecified: Secondary | ICD-10-CM | POA: Diagnosis not present

## 2011-07-08 DIAGNOSIS — R269 Unspecified abnormalities of gait and mobility: Secondary | ICD-10-CM | POA: Diagnosis not present

## 2011-07-08 DIAGNOSIS — Z7982 Long term (current) use of aspirin: Secondary | ICD-10-CM | POA: Diagnosis not present

## 2011-07-08 DIAGNOSIS — I4892 Unspecified atrial flutter: Secondary | ICD-10-CM | POA: Diagnosis not present

## 2011-07-08 DIAGNOSIS — Z125 Encounter for screening for malignant neoplasm of prostate: Secondary | ICD-10-CM | POA: Diagnosis not present

## 2011-07-08 DIAGNOSIS — N400 Enlarged prostate without lower urinary tract symptoms: Secondary | ICD-10-CM | POA: Diagnosis not present

## 2011-07-08 DIAGNOSIS — Z9221 Personal history of antineoplastic chemotherapy: Secondary | ICD-10-CM | POA: Diagnosis not present

## 2011-07-08 DIAGNOSIS — I999 Unspecified disorder of circulatory system: Secondary | ICD-10-CM | POA: Diagnosis not present

## 2011-07-08 DIAGNOSIS — R4789 Other speech disturbances: Secondary | ICD-10-CM | POA: Diagnosis not present

## 2011-07-08 DIAGNOSIS — E785 Hyperlipidemia, unspecified: Secondary | ICD-10-CM | POA: Diagnosis not present

## 2011-07-09 DIAGNOSIS — I10 Essential (primary) hypertension: Secondary | ICD-10-CM | POA: Diagnosis not present

## 2011-07-09 DIAGNOSIS — G459 Transient cerebral ischemic attack, unspecified: Secondary | ICD-10-CM | POA: Diagnosis not present

## 2011-07-09 DIAGNOSIS — R4789 Other speech disturbances: Secondary | ICD-10-CM | POA: Diagnosis not present

## 2011-07-09 DIAGNOSIS — N400 Enlarged prostate without lower urinary tract symptoms: Secondary | ICD-10-CM | POA: Diagnosis not present

## 2011-07-09 DIAGNOSIS — I4892 Unspecified atrial flutter: Secondary | ICD-10-CM | POA: Diagnosis not present

## 2011-07-09 DIAGNOSIS — E785 Hyperlipidemia, unspecified: Secondary | ICD-10-CM | POA: Diagnosis not present

## 2011-07-16 DIAGNOSIS — N200 Calculus of kidney: Secondary | ICD-10-CM | POA: Diagnosis not present

## 2011-07-18 DIAGNOSIS — N200 Calculus of kidney: Secondary | ICD-10-CM | POA: Diagnosis not present

## 2011-07-18 DIAGNOSIS — C259 Malignant neoplasm of pancreas, unspecified: Secondary | ICD-10-CM | POA: Diagnosis not present

## 2011-07-18 DIAGNOSIS — N281 Cyst of kidney, acquired: Secondary | ICD-10-CM | POA: Diagnosis not present

## 2011-07-18 DIAGNOSIS — Z9221 Personal history of antineoplastic chemotherapy: Secondary | ICD-10-CM | POA: Diagnosis not present

## 2011-07-18 DIAGNOSIS — K5730 Diverticulosis of large intestine without perforation or abscess without bleeding: Secondary | ICD-10-CM | POA: Diagnosis not present

## 2011-07-18 DIAGNOSIS — I2510 Atherosclerotic heart disease of native coronary artery without angina pectoris: Secondary | ICD-10-CM | POA: Diagnosis not present

## 2011-07-18 DIAGNOSIS — C772 Secondary and unspecified malignant neoplasm of intra-abdominal lymph nodes: Secondary | ICD-10-CM | POA: Diagnosis not present

## 2011-07-18 DIAGNOSIS — K909 Intestinal malabsorption, unspecified: Secondary | ICD-10-CM | POA: Diagnosis not present

## 2011-07-25 DIAGNOSIS — E569 Vitamin deficiency, unspecified: Secondary | ICD-10-CM | POA: Diagnosis not present

## 2011-07-25 DIAGNOSIS — R5383 Other fatigue: Secondary | ICD-10-CM | POA: Diagnosis not present

## 2011-07-25 DIAGNOSIS — R5381 Other malaise: Secondary | ICD-10-CM | POA: Diagnosis not present

## 2011-08-05 DIAGNOSIS — M949 Disorder of cartilage, unspecified: Secondary | ICD-10-CM | POA: Diagnosis not present

## 2011-08-05 DIAGNOSIS — M810 Age-related osteoporosis without current pathological fracture: Secondary | ICD-10-CM | POA: Diagnosis not present

## 2011-08-08 ENCOUNTER — Encounter: Payer: Medicare Other | Admitting: Internal Medicine

## 2011-08-08 DIAGNOSIS — C259 Malignant neoplasm of pancreas, unspecified: Secondary | ICD-10-CM | POA: Diagnosis not present

## 2011-08-08 DIAGNOSIS — Z452 Encounter for adjustment and management of vascular access device: Secondary | ICD-10-CM | POA: Diagnosis not present

## 2011-08-22 DIAGNOSIS — M109 Gout, unspecified: Secondary | ICD-10-CM | POA: Diagnosis not present

## 2011-09-05 DIAGNOSIS — R7989 Other specified abnormal findings of blood chemistry: Secondary | ICD-10-CM | POA: Diagnosis not present

## 2011-09-05 DIAGNOSIS — K9089 Other intestinal malabsorption: Secondary | ICD-10-CM | POA: Diagnosis not present

## 2011-09-05 DIAGNOSIS — C250 Malignant neoplasm of head of pancreas: Secondary | ICD-10-CM | POA: Diagnosis not present

## 2011-09-05 DIAGNOSIS — R791 Abnormal coagulation profile: Secondary | ICD-10-CM | POA: Diagnosis not present

## 2011-09-05 DIAGNOSIS — Z87898 Personal history of other specified conditions: Secondary | ICD-10-CM | POA: Diagnosis not present

## 2011-09-10 DIAGNOSIS — Z79899 Other long term (current) drug therapy: Secondary | ICD-10-CM | POA: Diagnosis not present

## 2011-09-10 DIAGNOSIS — R1084 Generalized abdominal pain: Secondary | ICD-10-CM | POA: Diagnosis not present

## 2011-09-16 DIAGNOSIS — R42 Dizziness and giddiness: Secondary | ICD-10-CM | POA: Diagnosis not present

## 2011-09-16 DIAGNOSIS — R4182 Altered mental status, unspecified: Secondary | ICD-10-CM | POA: Diagnosis not present

## 2011-09-16 DIAGNOSIS — C259 Malignant neoplasm of pancreas, unspecified: Secondary | ICD-10-CM | POA: Diagnosis not present

## 2011-09-16 DIAGNOSIS — I2510 Atherosclerotic heart disease of native coronary artery without angina pectoris: Secondary | ICD-10-CM | POA: Diagnosis not present

## 2011-09-16 DIAGNOSIS — N390 Urinary tract infection, site not specified: Secondary | ICD-10-CM | POA: Diagnosis not present

## 2011-09-16 DIAGNOSIS — Z87442 Personal history of urinary calculi: Secondary | ICD-10-CM | POA: Diagnosis not present

## 2011-09-16 DIAGNOSIS — N401 Enlarged prostate with lower urinary tract symptoms: Secondary | ICD-10-CM | POA: Diagnosis not present

## 2011-09-16 DIAGNOSIS — Z951 Presence of aortocoronary bypass graft: Secondary | ICD-10-CM | POA: Diagnosis not present

## 2011-09-16 DIAGNOSIS — R339 Retention of urine, unspecified: Secondary | ICD-10-CM | POA: Diagnosis not present

## 2011-09-16 DIAGNOSIS — K9081 Whipple's disease: Secondary | ICD-10-CM | POA: Diagnosis not present

## 2011-09-16 DIAGNOSIS — Z7982 Long term (current) use of aspirin: Secondary | ICD-10-CM | POA: Diagnosis not present

## 2011-09-16 DIAGNOSIS — T43205A Adverse effect of unspecified antidepressants, initial encounter: Secondary | ICD-10-CM | POA: Diagnosis not present

## 2011-09-16 DIAGNOSIS — E785 Hyperlipidemia, unspecified: Secondary | ICD-10-CM | POA: Diagnosis not present

## 2011-09-16 DIAGNOSIS — M109 Gout, unspecified: Secondary | ICD-10-CM | POA: Diagnosis not present

## 2011-09-16 DIAGNOSIS — E559 Vitamin D deficiency, unspecified: Secondary | ICD-10-CM | POA: Diagnosis not present

## 2011-09-16 DIAGNOSIS — G609 Hereditary and idiopathic neuropathy, unspecified: Secondary | ICD-10-CM | POA: Diagnosis not present

## 2011-09-16 DIAGNOSIS — I10 Essential (primary) hypertension: Secondary | ICD-10-CM | POA: Diagnosis not present

## 2011-09-16 DIAGNOSIS — K219 Gastro-esophageal reflux disease without esophagitis: Secondary | ICD-10-CM | POA: Diagnosis not present

## 2011-09-16 DIAGNOSIS — R413 Other amnesia: Secondary | ICD-10-CM | POA: Diagnosis not present

## 2011-09-16 DIAGNOSIS — F29 Unspecified psychosis not due to a substance or known physiological condition: Secondary | ICD-10-CM | POA: Diagnosis not present

## 2011-09-16 DIAGNOSIS — T4275XA Adverse effect of unspecified antiepileptic and sedative-hypnotic drugs, initial encounter: Secondary | ICD-10-CM | POA: Diagnosis not present

## 2011-09-17 DIAGNOSIS — C259 Malignant neoplasm of pancreas, unspecified: Secondary | ICD-10-CM | POA: Diagnosis not present

## 2011-09-17 DIAGNOSIS — K9081 Whipple's disease: Secondary | ICD-10-CM | POA: Diagnosis not present

## 2011-09-17 DIAGNOSIS — I658 Occlusion and stenosis of other precerebral arteries: Secondary | ICD-10-CM | POA: Diagnosis not present

## 2011-09-17 DIAGNOSIS — R42 Dizziness and giddiness: Secondary | ICD-10-CM | POA: Diagnosis not present

## 2011-09-17 DIAGNOSIS — R4182 Altered mental status, unspecified: Secondary | ICD-10-CM | POA: Diagnosis not present

## 2011-09-17 DIAGNOSIS — M109 Gout, unspecified: Secondary | ICD-10-CM | POA: Diagnosis not present

## 2011-09-17 DIAGNOSIS — I10 Essential (primary) hypertension: Secondary | ICD-10-CM | POA: Diagnosis not present

## 2011-09-17 DIAGNOSIS — I2510 Atherosclerotic heart disease of native coronary artery without angina pectoris: Secondary | ICD-10-CM | POA: Diagnosis not present

## 2011-09-19 DIAGNOSIS — Q233 Congenital mitral insufficiency: Secondary | ICD-10-CM | POA: Diagnosis not present

## 2011-09-19 DIAGNOSIS — I700 Atherosclerosis of aorta: Secondary | ICD-10-CM | POA: Diagnosis not present

## 2011-09-19 DIAGNOSIS — R42 Dizziness and giddiness: Secondary | ICD-10-CM | POA: Diagnosis not present

## 2011-09-19 DIAGNOSIS — I517 Cardiomegaly: Secondary | ICD-10-CM | POA: Diagnosis not present

## 2011-09-24 DIAGNOSIS — H905 Unspecified sensorineural hearing loss: Secondary | ICD-10-CM | POA: Diagnosis not present

## 2011-09-24 DIAGNOSIS — H833X9 Noise effects on inner ear, unspecified ear: Secondary | ICD-10-CM | POA: Diagnosis not present

## 2011-09-24 DIAGNOSIS — H9319 Tinnitus, unspecified ear: Secondary | ICD-10-CM | POA: Diagnosis not present

## 2011-09-25 ENCOUNTER — Encounter: Payer: Medicare Other | Admitting: Internal Medicine

## 2011-10-01 DIAGNOSIS — H40019 Open angle with borderline findings, low risk, unspecified eye: Secondary | ICD-10-CM | POA: Diagnosis not present

## 2011-10-01 DIAGNOSIS — H35379 Puckering of macula, unspecified eye: Secondary | ICD-10-CM | POA: Diagnosis not present

## 2011-10-01 DIAGNOSIS — Z961 Presence of intraocular lens: Secondary | ICD-10-CM | POA: Diagnosis not present

## 2011-10-01 DIAGNOSIS — H5200 Hypermetropia, unspecified eye: Secondary | ICD-10-CM | POA: Diagnosis not present

## 2011-10-04 DIAGNOSIS — A09 Infectious gastroenteritis and colitis, unspecified: Secondary | ICD-10-CM | POA: Diagnosis not present

## 2011-10-17 DIAGNOSIS — R197 Diarrhea, unspecified: Secondary | ICD-10-CM | POA: Diagnosis not present

## 2011-10-18 ENCOUNTER — Encounter: Payer: Medicare Other | Admitting: Hematology and Oncology

## 2011-10-18 DIAGNOSIS — C250 Malignant neoplasm of head of pancreas: Secondary | ICD-10-CM | POA: Diagnosis not present

## 2011-10-18 DIAGNOSIS — I2510 Atherosclerotic heart disease of native coronary artery without angina pectoris: Secondary | ICD-10-CM | POA: Diagnosis not present

## 2011-10-18 DIAGNOSIS — E785 Hyperlipidemia, unspecified: Secondary | ICD-10-CM | POA: Diagnosis not present

## 2011-10-18 DIAGNOSIS — K909 Intestinal malabsorption, unspecified: Secondary | ICD-10-CM | POA: Diagnosis not present

## 2011-10-18 DIAGNOSIS — N400 Enlarged prostate without lower urinary tract symptoms: Secondary | ICD-10-CM | POA: Diagnosis not present

## 2011-10-29 DIAGNOSIS — H40019 Open angle with borderline findings, low risk, unspecified eye: Secondary | ICD-10-CM | POA: Diagnosis not present

## 2011-10-29 DIAGNOSIS — H40049 Steroid responder, unspecified eye: Secondary | ICD-10-CM | POA: Diagnosis not present

## 2011-10-30 DIAGNOSIS — Z85828 Personal history of other malignant neoplasm of skin: Secondary | ICD-10-CM | POA: Diagnosis not present

## 2011-10-30 DIAGNOSIS — D044 Carcinoma in situ of skin of scalp and neck: Secondary | ICD-10-CM | POA: Diagnosis not present

## 2011-10-30 DIAGNOSIS — L819 Disorder of pigmentation, unspecified: Secondary | ICD-10-CM | POA: Diagnosis not present

## 2011-10-30 DIAGNOSIS — D485 Neoplasm of uncertain behavior of skin: Secondary | ICD-10-CM | POA: Diagnosis not present

## 2011-10-30 DIAGNOSIS — L570 Actinic keratosis: Secondary | ICD-10-CM | POA: Diagnosis not present

## 2011-11-07 ENCOUNTER — Ambulatory Visit: Payer: Medicare Other | Admitting: Cardiology

## 2011-11-13 DIAGNOSIS — Z23 Encounter for immunization: Secondary | ICD-10-CM | POA: Diagnosis not present

## 2011-11-13 IMAGING — RF DG MYELOGRAM CERVICAL
7 series · 7 of 7 positions shown · IV contrast (omnipaque)
Comparison: Cervical myelogram 07/29/2008

CLINICAL DATA: Left-sided neck pain without significant radicular
symptoms.  The pain is worse when turning to the left.

MYELOGRAM CERVICAL
TECHNIQUE: The subarachnoid injection of contrast was performed by
Dr. Erodita and will be dictated under separate cover.  A single
image demonstrates a needle positioned at the L5-S1 level via a
left paramidline approach. Following injection of intrathecal
Omnipaque contrast, spine imaging in multiple projections was
performed using fluoroscopy.
Fluoroscopy Time: 1.5 minutes.
TECHNIQUE: CT imaging of the cervical spine was performed after
intrathecal contrast administration. Multiplanar CT image
reconstructions were also generated.

[Series 1: run · 1 of 1 slices shown (1 of 7)]
[im 1/1]
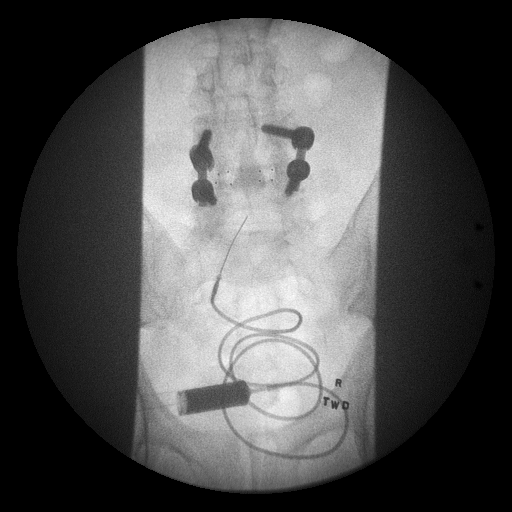

[Series 3: run · 1 of 1 slices shown (2 of 7)]
[im 1/1]
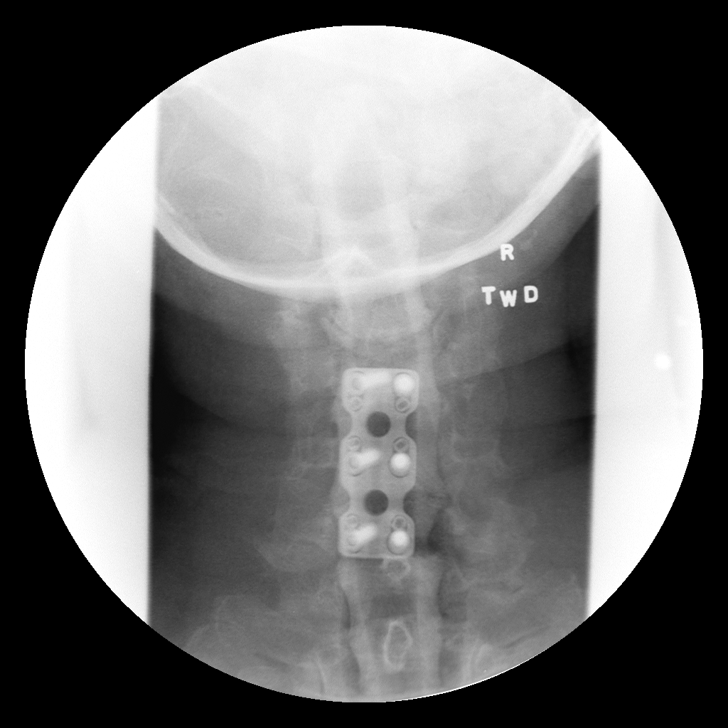

[Series 4: run · 1 of 1 slices shown (3 of 7)]
[im 1/1]
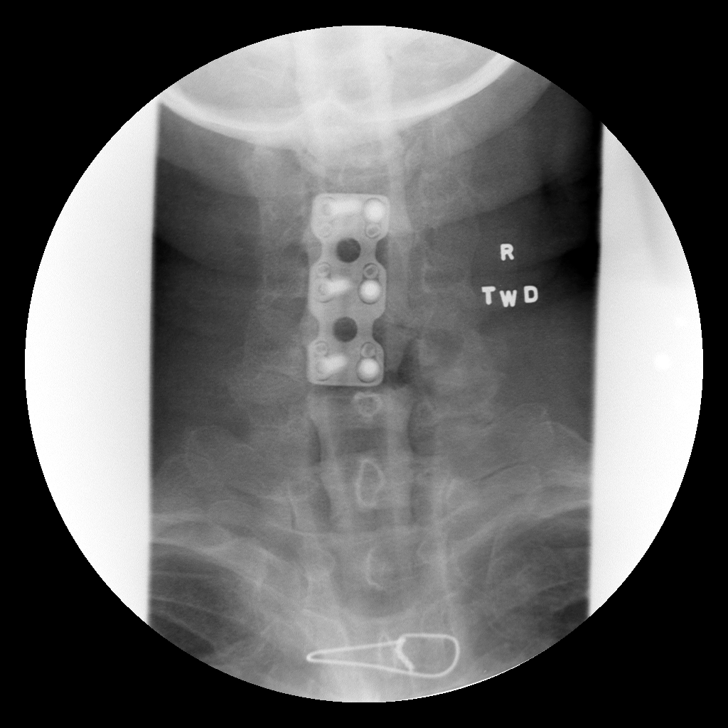

[Series 5: run · 1 of 1 slices shown (4 of 7)]
[im 1/1]
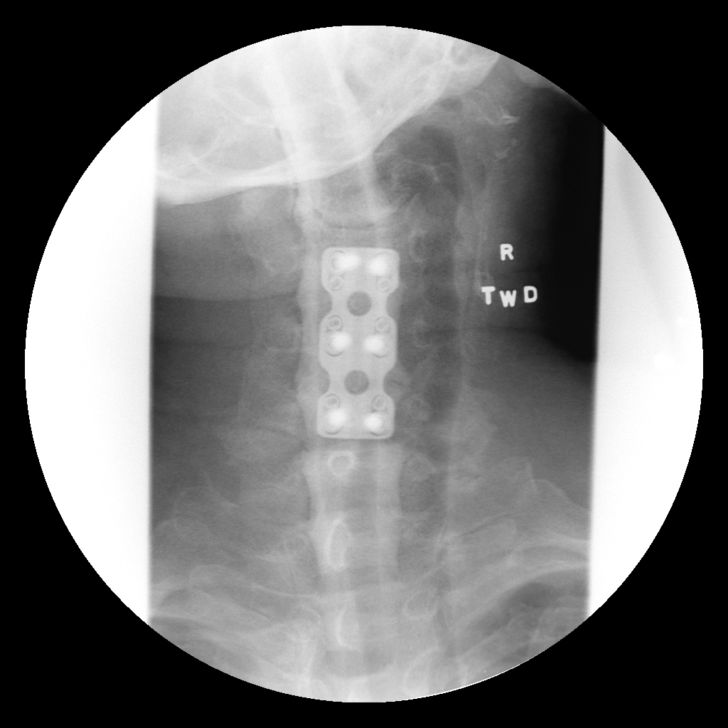

[Series 6: run · 1 of 1 slices shown (5 of 7)]
[im 1/1]
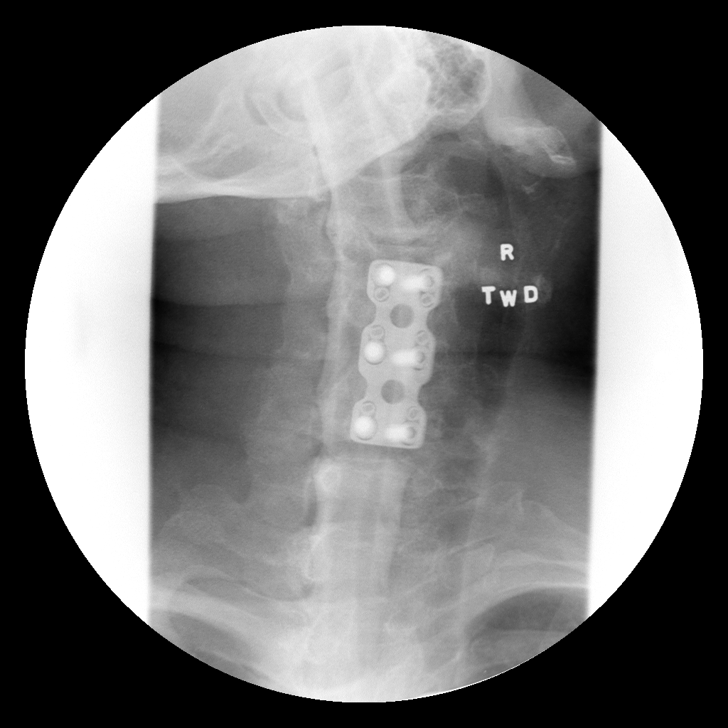

[Series 7: run · 1 of 1 slices shown (6 of 7)]
[im 1/1]
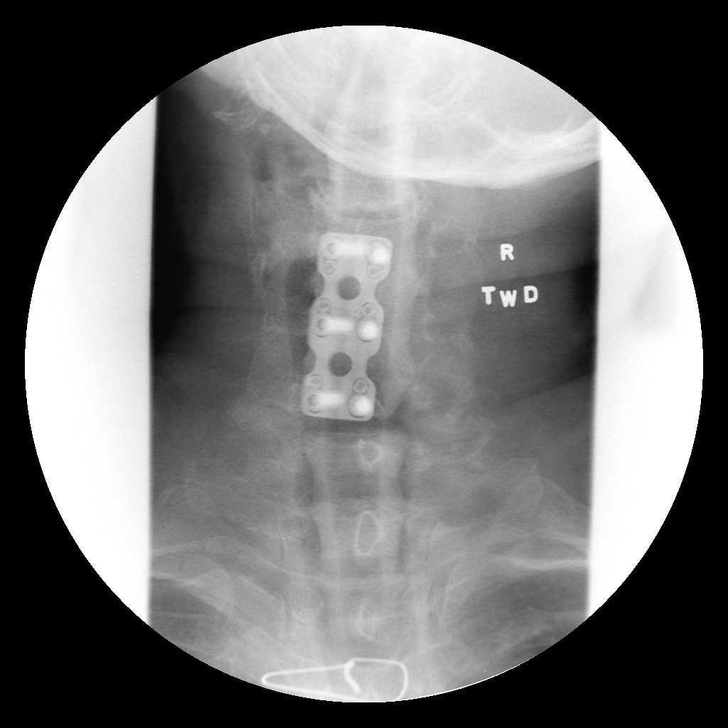

[Series 8: run · 1 of 1 slices shown (7 of 7)]
[im 1/1]
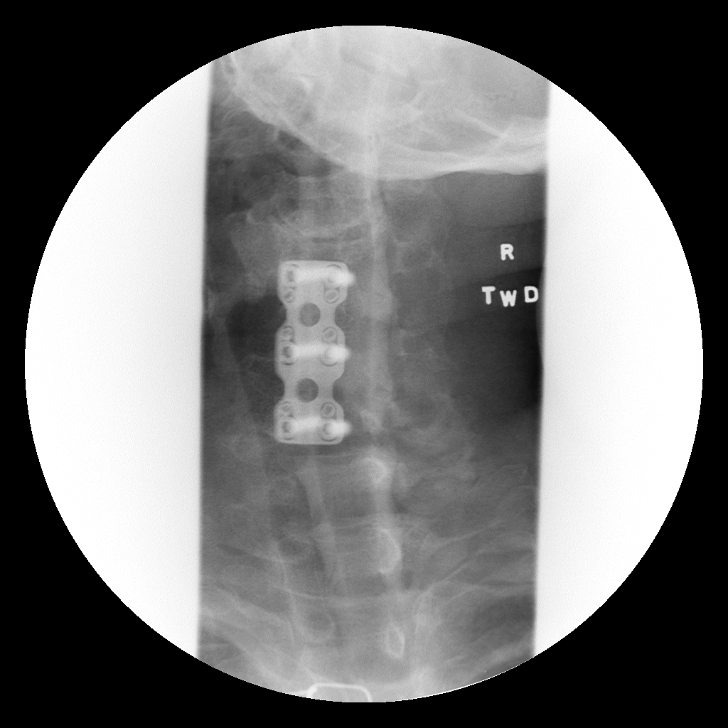

[7 of 7 positions shown; findings below may reference images not displayed]

FINDINGS: The patient is status post anterior cervical discectomy
and fusion at C4-5 and C5-6. Incomplete filling of the nerve roots
is present bilaterally at to C6-7, worse on the right.  Nerve roots
at C7-T1 appear to fill normally.  There is incomplete filling of
nerve roots bilaterally at C3-4.  A disc osteophyte complex at C3-4
is best visualized on the cross-table lateral view.  Endplate
degenerative change appears to have progressed.  A significant
ventral impression is also present at C6-7.
IMPRESSION: 1.  Stable anterior fusion at C4-5 and C5-6.
2.  Progressive end plate degenerative change at C3-4.
3.  Relatively stable ventral impression on the CSF space at C3-4
and C6-7.  Please see the CT report below.

CT MYELOGRAPHY CERVICAL SPINE
FINDINGS: The cervical spine is imaged from the skull base through
T2-3.  The craniocervical junction is unremarkable.  Degenerative
change anteriorly at C1-2 are stable.  The patient is status post
anterior fusion at C4-5 and C5-6.  The posterior elements are fused
on the right.  There is fusion on the left at C5-6, but not
definitively at C4-5, similar to prior study.  The hardware is
intact.  The fusion is mature.  The craniocervical junction is
within normal limits.  Individual disc levels are as follows.

C2-3:  A shallow central disc bulge is stable.  Left-sided facet
hypertrophy is noted without significant stenosis.

C3-4:  A broad-based disc osteophyte complex is stable.  There is
effacement of the ventral CSF.  Mild to moderate foraminal stenosis
is present bilaterally.  Facet hypertrophy is slightly more
pronounced on the left.

C4-5:  The patient is status post anterior fusion.  Residual
osteophyte formation is stable.  There is slight effacement of the
ventral CSF without significant stenosis.  The foramina are patent
bilaterally.

C5-6:  Asymmetric right-sided uncovertebral disease is noted.
Residual right foraminal narrowing is stable.  The central canal is
patent.

C6-7:  Asymmetric right-sided disc bulging is present.  There is
progressive right-sided facet disease with progressive subchondral
cystic change, particularly involving the inferior articulating
process of C6.  1 mm anterolisthesis is not significantly changed.
Osseous foraminal narrowing is stable, worse on the right.

C7-T1:  Asymmetric right-sided facet hypertrophy is stable.  There
is no focal stenosis.

T1-2:  Negative.

Atherosclerotic changes are noted at the aorta and great vessels.
The lung apices are clear.
IMPRESSION: 1.  Stable fusion at C4-5 and C5-6.
2.  Progressive right-sided facet arthropathy and C6-7 with
interval development of subchondral cystic change.
3.  Stable 1 ml anterolisthesis at C6-7 with right greater than
left foraminal stenosis.
4.  Stable disc osteophyte complex and uncovertebral disease at C3-
4, worse on the right.
5.  Asymmetric right-sided facet hypertrophy at C7-T1 without
significant stenosis.

## 2011-11-14 DIAGNOSIS — C4442 Squamous cell carcinoma of skin of scalp and neck: Secondary | ICD-10-CM | POA: Diagnosis not present

## 2011-11-20 DIAGNOSIS — N390 Urinary tract infection, site not specified: Secondary | ICD-10-CM | POA: Diagnosis not present

## 2011-11-20 DIAGNOSIS — N400 Enlarged prostate without lower urinary tract symptoms: Secondary | ICD-10-CM | POA: Diagnosis not present

## 2011-11-26 ENCOUNTER — Encounter: Payer: Self-pay | Admitting: Cardiology

## 2011-12-12 DIAGNOSIS — C250 Malignant neoplasm of head of pancreas: Secondary | ICD-10-CM | POA: Diagnosis not present

## 2011-12-12 DIAGNOSIS — D490 Neoplasm of unspecified behavior of digestive system: Secondary | ICD-10-CM | POA: Diagnosis not present

## 2011-12-12 DIAGNOSIS — N289 Disorder of kidney and ureter, unspecified: Secondary | ICD-10-CM | POA: Diagnosis not present

## 2011-12-12 DIAGNOSIS — K7689 Other specified diseases of liver: Secondary | ICD-10-CM | POA: Diagnosis not present

## 2011-12-12 DIAGNOSIS — K9089 Other intestinal malabsorption: Secondary | ICD-10-CM | POA: Diagnosis not present

## 2011-12-12 DIAGNOSIS — N200 Calculus of kidney: Secondary | ICD-10-CM | POA: Diagnosis not present

## 2011-12-31 DIAGNOSIS — R5081 Fever presenting with conditions classified elsewhere: Secondary | ICD-10-CM | POA: Diagnosis not present

## 2012-01-06 DIAGNOSIS — E782 Mixed hyperlipidemia: Secondary | ICD-10-CM | POA: Diagnosis not present

## 2012-01-11 DIAGNOSIS — N1330 Unspecified hydronephrosis: Secondary | ICD-10-CM | POA: Diagnosis not present

## 2012-01-11 DIAGNOSIS — Z79899 Other long term (current) drug therapy: Secondary | ICD-10-CM | POA: Diagnosis not present

## 2012-01-11 DIAGNOSIS — N200 Calculus of kidney: Secondary | ICD-10-CM | POA: Diagnosis not present

## 2012-01-11 DIAGNOSIS — Z8509 Personal history of malignant neoplasm of other digestive organs: Secondary | ICD-10-CM | POA: Diagnosis not present

## 2012-01-11 DIAGNOSIS — Z7982 Long term (current) use of aspirin: Secondary | ICD-10-CM | POA: Diagnosis not present

## 2012-01-11 DIAGNOSIS — N201 Calculus of ureter: Secondary | ICD-10-CM | POA: Diagnosis not present

## 2012-01-11 DIAGNOSIS — R109 Unspecified abdominal pain: Secondary | ICD-10-CM | POA: Diagnosis not present

## 2012-01-11 DIAGNOSIS — Z951 Presence of aortocoronary bypass graft: Secondary | ICD-10-CM | POA: Diagnosis not present

## 2012-01-14 ENCOUNTER — Encounter: Payer: Medicare Other | Admitting: Internal Medicine

## 2012-01-14 DIAGNOSIS — Z452 Encounter for adjustment and management of vascular access device: Secondary | ICD-10-CM

## 2012-01-14 DIAGNOSIS — C259 Malignant neoplasm of pancreas, unspecified: Secondary | ICD-10-CM

## 2012-01-14 DIAGNOSIS — M4802 Spinal stenosis, cervical region: Secondary | ICD-10-CM | POA: Diagnosis not present

## 2012-01-27 DIAGNOSIS — R21 Rash and other nonspecific skin eruption: Secondary | ICD-10-CM | POA: Diagnosis not present

## 2012-02-25 DIAGNOSIS — C259 Malignant neoplasm of pancreas, unspecified: Secondary | ICD-10-CM | POA: Diagnosis not present

## 2012-02-25 DIAGNOSIS — N400 Enlarged prostate without lower urinary tract symptoms: Secondary | ICD-10-CM | POA: Diagnosis not present

## 2012-02-25 DIAGNOSIS — N200 Calculus of kidney: Secondary | ICD-10-CM | POA: Diagnosis not present

## 2012-02-25 DIAGNOSIS — R7989 Other specified abnormal findings of blood chemistry: Secondary | ICD-10-CM | POA: Diagnosis not present

## 2012-03-12 DIAGNOSIS — N289 Disorder of kidney and ureter, unspecified: Secondary | ICD-10-CM | POA: Diagnosis not present

## 2012-03-12 DIAGNOSIS — R7989 Other specified abnormal findings of blood chemistry: Secondary | ICD-10-CM | POA: Diagnosis not present

## 2012-03-12 DIAGNOSIS — D490 Neoplasm of unspecified behavior of digestive system: Secondary | ICD-10-CM | POA: Diagnosis not present

## 2012-03-12 DIAGNOSIS — K9089 Other intestinal malabsorption: Secondary | ICD-10-CM | POA: Diagnosis not present

## 2012-03-12 DIAGNOSIS — C250 Malignant neoplasm of head of pancreas: Secondary | ICD-10-CM | POA: Diagnosis not present

## 2012-03-12 DIAGNOSIS — R791 Abnormal coagulation profile: Secondary | ICD-10-CM | POA: Diagnosis not present

## 2012-03-20 DIAGNOSIS — H2510 Age-related nuclear cataract, unspecified eye: Secondary | ICD-10-CM | POA: Diagnosis not present

## 2012-03-24 DIAGNOSIS — Z885 Allergy status to narcotic agent status: Secondary | ICD-10-CM | POA: Diagnosis not present

## 2012-03-24 DIAGNOSIS — N400 Enlarged prostate without lower urinary tract symptoms: Secondary | ICD-10-CM | POA: Diagnosis present

## 2012-03-24 DIAGNOSIS — K2990 Gastroduodenitis, unspecified, without bleeding: Secondary | ICD-10-CM | POA: Diagnosis not present

## 2012-03-24 DIAGNOSIS — IMO0002 Reserved for concepts with insufficient information to code with codable children: Secondary | ICD-10-CM | POA: Diagnosis not present

## 2012-03-24 DIAGNOSIS — B9689 Other specified bacterial agents as the cause of diseases classified elsewhere: Secondary | ICD-10-CM | POA: Diagnosis not present

## 2012-03-24 DIAGNOSIS — Z7982 Long term (current) use of aspirin: Secondary | ICD-10-CM | POA: Diagnosis not present

## 2012-03-24 DIAGNOSIS — R630 Anorexia: Secondary | ICD-10-CM | POA: Diagnosis present

## 2012-03-24 DIAGNOSIS — K2970 Gastritis, unspecified, without bleeding: Secondary | ICD-10-CM | POA: Diagnosis present

## 2012-03-24 DIAGNOSIS — R059 Cough, unspecified: Secondary | ICD-10-CM | POA: Diagnosis not present

## 2012-03-24 DIAGNOSIS — Z888 Allergy status to other drugs, medicaments and biological substances status: Secondary | ICD-10-CM | POA: Diagnosis not present

## 2012-03-24 DIAGNOSIS — R509 Fever, unspecified: Secondary | ICD-10-CM | POA: Diagnosis not present

## 2012-03-24 DIAGNOSIS — R52 Pain, unspecified: Secondary | ICD-10-CM | POA: Diagnosis not present

## 2012-03-24 DIAGNOSIS — Z8509 Personal history of malignant neoplasm of other digestive organs: Secondary | ICD-10-CM | POA: Diagnosis not present

## 2012-03-24 DIAGNOSIS — R7881 Bacteremia: Secondary | ICD-10-CM | POA: Diagnosis present

## 2012-03-24 DIAGNOSIS — R51 Headache: Secondary | ICD-10-CM | POA: Diagnosis not present

## 2012-03-24 DIAGNOSIS — E860 Dehydration: Secondary | ICD-10-CM | POA: Diagnosis present

## 2012-03-24 DIAGNOSIS — E785 Hyperlipidemia, unspecified: Secondary | ICD-10-CM | POA: Diagnosis present

## 2012-03-24 DIAGNOSIS — R05 Cough: Secondary | ICD-10-CM | POA: Diagnosis not present

## 2012-03-24 DIAGNOSIS — Z79899 Other long term (current) drug therapy: Secondary | ICD-10-CM | POA: Diagnosis not present

## 2012-03-24 DIAGNOSIS — B9789 Other viral agents as the cause of diseases classified elsewhere: Secondary | ICD-10-CM | POA: Diagnosis not present

## 2012-03-24 DIAGNOSIS — I10 Essential (primary) hypertension: Secondary | ICD-10-CM | POA: Diagnosis not present

## 2012-04-13 DIAGNOSIS — A414 Sepsis due to anaerobes: Secondary | ICD-10-CM | POA: Diagnosis not present

## 2012-04-21 DIAGNOSIS — Z452 Encounter for adjustment and management of vascular access device: Secondary | ICD-10-CM | POA: Diagnosis not present

## 2012-04-21 DIAGNOSIS — C259 Malignant neoplasm of pancreas, unspecified: Secondary | ICD-10-CM | POA: Diagnosis not present

## 2012-05-13 DIAGNOSIS — Z85828 Personal history of other malignant neoplasm of skin: Secondary | ICD-10-CM | POA: Diagnosis not present

## 2012-05-13 DIAGNOSIS — L570 Actinic keratosis: Secondary | ICD-10-CM | POA: Diagnosis not present

## 2012-06-11 DIAGNOSIS — Q618 Other cystic kidney diseases: Secondary | ICD-10-CM | POA: Diagnosis not present

## 2012-06-11 DIAGNOSIS — R7881 Bacteremia: Secondary | ICD-10-CM | POA: Diagnosis not present

## 2012-06-11 DIAGNOSIS — C250 Malignant neoplasm of head of pancreas: Secondary | ICD-10-CM | POA: Diagnosis not present

## 2012-06-11 DIAGNOSIS — D490 Neoplasm of unspecified behavior of digestive system: Secondary | ICD-10-CM | POA: Diagnosis not present

## 2012-06-11 DIAGNOSIS — B961 Klebsiella pneumoniae [K. pneumoniae] as the cause of diseases classified elsewhere: Secondary | ICD-10-CM | POA: Diagnosis not present

## 2012-06-16 DIAGNOSIS — C259 Malignant neoplasm of pancreas, unspecified: Secondary | ICD-10-CM | POA: Diagnosis not present

## 2012-06-17 ENCOUNTER — Encounter: Payer: Medicare Other | Admitting: Internal Medicine

## 2012-06-17 DIAGNOSIS — R748 Abnormal levels of other serum enzymes: Secondary | ICD-10-CM | POA: Diagnosis not present

## 2012-06-17 DIAGNOSIS — G4700 Insomnia, unspecified: Secondary | ICD-10-CM | POA: Diagnosis not present

## 2012-06-17 DIAGNOSIS — C259 Malignant neoplasm of pancreas, unspecified: Secondary | ICD-10-CM | POA: Diagnosis not present

## 2012-07-02 DIAGNOSIS — E299 Testicular dysfunction, unspecified: Secondary | ICD-10-CM | POA: Diagnosis not present

## 2012-07-27 DIAGNOSIS — N400 Enlarged prostate without lower urinary tract symptoms: Secondary | ICD-10-CM | POA: Diagnosis not present

## 2012-07-29 DIAGNOSIS — Z452 Encounter for adjustment and management of vascular access device: Secondary | ICD-10-CM

## 2012-07-29 DIAGNOSIS — C259 Malignant neoplasm of pancreas, unspecified: Secondary | ICD-10-CM

## 2012-07-30 DIAGNOSIS — E782 Mixed hyperlipidemia: Secondary | ICD-10-CM | POA: Diagnosis not present

## 2012-08-26 DIAGNOSIS — N400 Enlarged prostate without lower urinary tract symptoms: Secondary | ICD-10-CM | POA: Diagnosis not present

## 2012-09-09 DIAGNOSIS — Z452 Encounter for adjustment and management of vascular access device: Secondary | ICD-10-CM

## 2012-09-09 DIAGNOSIS — C259 Malignant neoplasm of pancreas, unspecified: Secondary | ICD-10-CM | POA: Diagnosis not present

## 2012-09-10 DIAGNOSIS — C250 Malignant neoplasm of head of pancreas: Secondary | ICD-10-CM | POA: Diagnosis not present

## 2012-09-10 DIAGNOSIS — Z0389 Encounter for observation for other suspected diseases and conditions ruled out: Secondary | ICD-10-CM | POA: Diagnosis not present

## 2012-09-10 DIAGNOSIS — D490 Neoplasm of unspecified behavior of digestive system: Secondary | ICD-10-CM | POA: Diagnosis not present

## 2012-09-10 DIAGNOSIS — N289 Disorder of kidney and ureter, unspecified: Secondary | ICD-10-CM | POA: Diagnosis not present

## 2012-09-10 DIAGNOSIS — K7689 Other specified diseases of liver: Secondary | ICD-10-CM | POA: Diagnosis not present

## 2012-09-18 DIAGNOSIS — Z7982 Long term (current) use of aspirin: Secondary | ICD-10-CM | POA: Diagnosis not present

## 2012-09-18 DIAGNOSIS — I10 Essential (primary) hypertension: Secondary | ICD-10-CM | POA: Diagnosis not present

## 2012-09-18 DIAGNOSIS — N400 Enlarged prostate without lower urinary tract symptoms: Secondary | ICD-10-CM | POA: Diagnosis not present

## 2012-09-18 DIAGNOSIS — Z79899 Other long term (current) drug therapy: Secondary | ICD-10-CM | POA: Diagnosis not present

## 2012-09-18 DIAGNOSIS — N23 Unspecified renal colic: Secondary | ICD-10-CM | POA: Diagnosis not present

## 2012-09-18 DIAGNOSIS — Z8249 Family history of ischemic heart disease and other diseases of the circulatory system: Secondary | ICD-10-CM | POA: Diagnosis not present

## 2012-09-18 DIAGNOSIS — Z885 Allergy status to narcotic agent status: Secondary | ICD-10-CM | POA: Diagnosis not present

## 2012-09-18 DIAGNOSIS — N200 Calculus of kidney: Secondary | ICD-10-CM | POA: Diagnosis not present

## 2012-09-18 DIAGNOSIS — Z888 Allergy status to other drugs, medicaments and biological substances status: Secondary | ICD-10-CM | POA: Diagnosis not present

## 2012-09-18 DIAGNOSIS — K861 Other chronic pancreatitis: Secondary | ICD-10-CM | POA: Diagnosis not present

## 2012-09-18 DIAGNOSIS — R748 Abnormal levels of other serum enzymes: Secondary | ICD-10-CM | POA: Diagnosis not present

## 2012-09-18 DIAGNOSIS — E785 Hyperlipidemia, unspecified: Secondary | ICD-10-CM | POA: Diagnosis not present

## 2012-09-18 DIAGNOSIS — I2510 Atherosclerotic heart disease of native coronary artery without angina pectoris: Secondary | ICD-10-CM | POA: Diagnosis not present

## 2012-09-18 DIAGNOSIS — R109 Unspecified abdominal pain: Secondary | ICD-10-CM | POA: Diagnosis not present

## 2012-09-18 DIAGNOSIS — Z8509 Personal history of malignant neoplasm of other digestive organs: Secondary | ICD-10-CM | POA: Diagnosis not present

## 2012-09-19 DIAGNOSIS — N200 Calculus of kidney: Secondary | ICD-10-CM | POA: Diagnosis not present

## 2012-09-19 DIAGNOSIS — I10 Essential (primary) hypertension: Secondary | ICD-10-CM | POA: Diagnosis not present

## 2012-09-19 DIAGNOSIS — N23 Unspecified renal colic: Secondary | ICD-10-CM | POA: Diagnosis not present

## 2012-09-19 DIAGNOSIS — Z8509 Personal history of malignant neoplasm of other digestive organs: Secondary | ICD-10-CM | POA: Diagnosis not present

## 2012-09-19 DIAGNOSIS — R109 Unspecified abdominal pain: Secondary | ICD-10-CM | POA: Diagnosis not present

## 2012-09-19 DIAGNOSIS — R748 Abnormal levels of other serum enzymes: Secondary | ICD-10-CM | POA: Diagnosis not present

## 2012-09-19 DIAGNOSIS — K861 Other chronic pancreatitis: Secondary | ICD-10-CM | POA: Diagnosis not present

## 2012-09-20 DIAGNOSIS — N200 Calculus of kidney: Secondary | ICD-10-CM | POA: Diagnosis not present

## 2012-09-20 DIAGNOSIS — I10 Essential (primary) hypertension: Secondary | ICD-10-CM | POA: Diagnosis not present

## 2012-09-20 DIAGNOSIS — Z8509 Personal history of malignant neoplasm of other digestive organs: Secondary | ICD-10-CM | POA: Diagnosis not present

## 2012-09-20 DIAGNOSIS — K861 Other chronic pancreatitis: Secondary | ICD-10-CM | POA: Diagnosis not present

## 2012-09-20 DIAGNOSIS — R109 Unspecified abdominal pain: Secondary | ICD-10-CM | POA: Diagnosis not present

## 2012-09-20 DIAGNOSIS — R748 Abnormal levels of other serum enzymes: Secondary | ICD-10-CM | POA: Diagnosis not present

## 2012-09-20 DIAGNOSIS — N23 Unspecified renal colic: Secondary | ICD-10-CM | POA: Diagnosis not present

## 2012-09-23 DIAGNOSIS — N200 Calculus of kidney: Secondary | ICD-10-CM | POA: Diagnosis not present

## 2012-09-23 DIAGNOSIS — N209 Urinary calculus, unspecified: Secondary | ICD-10-CM | POA: Diagnosis not present

## 2012-10-15 DIAGNOSIS — L242 Irritant contact dermatitis due to solvents: Secondary | ICD-10-CM | POA: Diagnosis not present

## 2012-10-21 DIAGNOSIS — Z452 Encounter for adjustment and management of vascular access device: Secondary | ICD-10-CM | POA: Diagnosis not present

## 2012-10-21 DIAGNOSIS — C259 Malignant neoplasm of pancreas, unspecified: Secondary | ICD-10-CM

## 2012-10-28 DIAGNOSIS — L98499 Non-pressure chronic ulcer of skin of other sites with unspecified severity: Secondary | ICD-10-CM | POA: Diagnosis not present

## 2012-10-28 DIAGNOSIS — L819 Disorder of pigmentation, unspecified: Secondary | ICD-10-CM | POA: Diagnosis not present

## 2012-10-28 DIAGNOSIS — Z85828 Personal history of other malignant neoplasm of skin: Secondary | ICD-10-CM | POA: Diagnosis not present

## 2012-10-28 DIAGNOSIS — D485 Neoplasm of uncertain behavior of skin: Secondary | ICD-10-CM | POA: Diagnosis not present

## 2012-10-28 DIAGNOSIS — L259 Unspecified contact dermatitis, unspecified cause: Secondary | ICD-10-CM | POA: Diagnosis not present

## 2012-10-28 DIAGNOSIS — L570 Actinic keratosis: Secondary | ICD-10-CM | POA: Diagnosis not present

## 2012-11-10 DIAGNOSIS — Z23 Encounter for immunization: Secondary | ICD-10-CM | POA: Diagnosis not present

## 2012-11-30 DIAGNOSIS — Z85828 Personal history of other malignant neoplasm of skin: Secondary | ICD-10-CM | POA: Diagnosis not present

## 2012-11-30 DIAGNOSIS — L819 Disorder of pigmentation, unspecified: Secondary | ICD-10-CM | POA: Diagnosis not present

## 2012-12-02 DIAGNOSIS — C259 Malignant neoplasm of pancreas, unspecified: Secondary | ICD-10-CM

## 2012-12-02 DIAGNOSIS — Z452 Encounter for adjustment and management of vascular access device: Secondary | ICD-10-CM

## 2012-12-07 DIAGNOSIS — N200 Calculus of kidney: Secondary | ICD-10-CM | POA: Diagnosis not present

## 2012-12-17 DIAGNOSIS — N289 Disorder of kidney and ureter, unspecified: Secondary | ICD-10-CM | POA: Diagnosis not present

## 2012-12-17 DIAGNOSIS — C250 Malignant neoplasm of head of pancreas: Secondary | ICD-10-CM | POA: Diagnosis not present

## 2012-12-17 DIAGNOSIS — Z0389 Encounter for observation for other suspected diseases and conditions ruled out: Secondary | ICD-10-CM | POA: Diagnosis not present

## 2013-01-05 DIAGNOSIS — J189 Pneumonia, unspecified organism: Secondary | ICD-10-CM | POA: Insufficient documentation

## 2013-01-05 DIAGNOSIS — E7800 Pure hypercholesterolemia, unspecified: Secondary | ICD-10-CM | POA: Insufficient documentation

## 2013-01-05 DIAGNOSIS — N400 Enlarged prostate without lower urinary tract symptoms: Secondary | ICD-10-CM | POA: Insufficient documentation

## 2013-01-05 DIAGNOSIS — R55 Syncope and collapse: Secondary | ICD-10-CM | POA: Insufficient documentation

## 2013-01-05 DIAGNOSIS — R197 Diarrhea, unspecified: Secondary | ICD-10-CM | POA: Insufficient documentation

## 2013-01-05 DIAGNOSIS — Z9889 Other specified postprocedural states: Secondary | ICD-10-CM | POA: Insufficient documentation

## 2013-01-05 DIAGNOSIS — I2510 Atherosclerotic heart disease of native coronary artery without angina pectoris: Secondary | ICD-10-CM | POA: Insufficient documentation

## 2013-01-05 DIAGNOSIS — J302 Other seasonal allergic rhinitis: Secondary | ICD-10-CM | POA: Insufficient documentation

## 2013-01-05 DIAGNOSIS — C259 Malignant neoplasm of pancreas, unspecified: Secondary | ICD-10-CM | POA: Insufficient documentation

## 2013-01-08 DIAGNOSIS — I10 Essential (primary) hypertension: Secondary | ICD-10-CM | POA: Diagnosis not present

## 2013-01-18 DIAGNOSIS — Z452 Encounter for adjustment and management of vascular access device: Secondary | ICD-10-CM | POA: Diagnosis not present

## 2013-01-18 DIAGNOSIS — C259 Malignant neoplasm of pancreas, unspecified: Secondary | ICD-10-CM

## 2013-02-01 DIAGNOSIS — Z95828 Presence of other vascular implants and grafts: Secondary | ICD-10-CM | POA: Insufficient documentation

## 2013-02-16 DIAGNOSIS — J209 Acute bronchitis, unspecified: Secondary | ICD-10-CM | POA: Diagnosis not present

## 2013-03-02 DIAGNOSIS — Z9889 Other specified postprocedural states: Secondary | ICD-10-CM | POA: Diagnosis not present

## 2013-03-22 DIAGNOSIS — E782 Mixed hyperlipidemia: Secondary | ICD-10-CM | POA: Diagnosis not present

## 2013-04-07 DIAGNOSIS — Z9889 Other specified postprocedural states: Secondary | ICD-10-CM | POA: Diagnosis not present

## 2013-04-16 DIAGNOSIS — H2510 Age-related nuclear cataract, unspecified eye: Secondary | ICD-10-CM | POA: Diagnosis not present

## 2013-04-26 DIAGNOSIS — D485 Neoplasm of uncertain behavior of skin: Secondary | ICD-10-CM | POA: Diagnosis not present

## 2013-04-26 DIAGNOSIS — Z85828 Personal history of other malignant neoplasm of skin: Secondary | ICD-10-CM | POA: Diagnosis not present

## 2013-04-26 DIAGNOSIS — L570 Actinic keratosis: Secondary | ICD-10-CM | POA: Diagnosis not present

## 2013-04-29 DIAGNOSIS — L570 Actinic keratosis: Secondary | ICD-10-CM | POA: Diagnosis not present

## 2013-05-19 DIAGNOSIS — Z9889 Other specified postprocedural states: Secondary | ICD-10-CM | POA: Diagnosis not present

## 2013-06-17 DIAGNOSIS — I77811 Abdominal aortic ectasia: Secondary | ICD-10-CM | POA: Diagnosis not present

## 2013-06-17 DIAGNOSIS — C250 Malignant neoplasm of head of pancreas: Secondary | ICD-10-CM | POA: Diagnosis not present

## 2013-06-17 DIAGNOSIS — K5730 Diverticulosis of large intestine without perforation or abscess without bleeding: Secondary | ICD-10-CM | POA: Diagnosis not present

## 2013-06-17 DIAGNOSIS — R599 Enlarged lymph nodes, unspecified: Secondary | ICD-10-CM | POA: Diagnosis not present

## 2013-06-17 DIAGNOSIS — I700 Atherosclerosis of aorta: Secondary | ICD-10-CM | POA: Diagnosis not present

## 2013-06-17 DIAGNOSIS — Z9089 Acquired absence of other organs: Secondary | ICD-10-CM | POA: Diagnosis not present

## 2013-06-17 DIAGNOSIS — M4310 Spondylolisthesis, site unspecified: Secondary | ICD-10-CM | POA: Diagnosis not present

## 2013-06-17 DIAGNOSIS — N200 Calculus of kidney: Secondary | ICD-10-CM | POA: Diagnosis not present

## 2013-06-17 DIAGNOSIS — IMO0002 Reserved for concepts with insufficient information to code with codable children: Secondary | ICD-10-CM | POA: Diagnosis not present

## 2013-06-17 DIAGNOSIS — K838 Other specified diseases of biliary tract: Secondary | ICD-10-CM | POA: Diagnosis not present

## 2013-06-17 DIAGNOSIS — J8410 Pulmonary fibrosis, unspecified: Secondary | ICD-10-CM | POA: Diagnosis not present

## 2013-06-17 DIAGNOSIS — K9089 Other intestinal malabsorption: Secondary | ICD-10-CM | POA: Diagnosis not present

## 2013-06-17 DIAGNOSIS — Z90411 Acquired partial absence of pancreas: Secondary | ICD-10-CM | POA: Diagnosis not present

## 2013-06-17 DIAGNOSIS — N289 Disorder of kidney and ureter, unspecified: Secondary | ICD-10-CM | POA: Diagnosis not present

## 2013-06-17 DIAGNOSIS — K7689 Other specified diseases of liver: Secondary | ICD-10-CM | POA: Diagnosis not present

## 2013-06-17 DIAGNOSIS — Z9889 Other specified postprocedural states: Secondary | ICD-10-CM | POA: Diagnosis not present

## 2013-06-17 DIAGNOSIS — Z951 Presence of aortocoronary bypass graft: Secondary | ICD-10-CM | POA: Diagnosis not present

## 2013-06-17 DIAGNOSIS — Z981 Arthrodesis status: Secondary | ICD-10-CM | POA: Diagnosis not present

## 2013-06-17 DIAGNOSIS — R7989 Other specified abnormal findings of blood chemistry: Secondary | ICD-10-CM | POA: Diagnosis not present

## 2013-06-17 DIAGNOSIS — I708 Atherosclerosis of other arteries: Secondary | ICD-10-CM | POA: Diagnosis not present

## 2013-06-17 DIAGNOSIS — I2510 Atherosclerotic heart disease of native coronary artery without angina pectoris: Secondary | ICD-10-CM | POA: Diagnosis not present

## 2013-06-17 DIAGNOSIS — Q278 Other specified congenital malformations of peripheral vascular system: Secondary | ICD-10-CM | POA: Diagnosis not present

## 2013-06-17 DIAGNOSIS — R918 Other nonspecific abnormal finding of lung field: Secondary | ICD-10-CM | POA: Diagnosis not present

## 2013-06-17 DIAGNOSIS — D490 Neoplasm of unspecified behavior of digestive system: Secondary | ICD-10-CM | POA: Diagnosis not present

## 2013-06-17 DIAGNOSIS — Z934 Other artificial openings of gastrointestinal tract status: Secondary | ICD-10-CM | POA: Diagnosis not present

## 2013-06-29 DIAGNOSIS — Z0000 Encounter for general adult medical examination without abnormal findings: Secondary | ICD-10-CM | POA: Diagnosis not present

## 2013-06-29 DIAGNOSIS — R5383 Other fatigue: Secondary | ICD-10-CM | POA: Diagnosis not present

## 2013-06-29 DIAGNOSIS — R5381 Other malaise: Secondary | ICD-10-CM | POA: Diagnosis not present

## 2013-06-30 DIAGNOSIS — Z9889 Other specified postprocedural states: Secondary | ICD-10-CM | POA: Diagnosis not present

## 2013-07-05 DIAGNOSIS — N200 Calculus of kidney: Secondary | ICD-10-CM | POA: Diagnosis not present

## 2013-07-26 DIAGNOSIS — N200 Calculus of kidney: Secondary | ICD-10-CM | POA: Diagnosis not present

## 2013-08-17 DIAGNOSIS — Z6829 Body mass index (BMI) 29.0-29.9, adult: Secondary | ICD-10-CM | POA: Diagnosis not present

## 2013-08-17 DIAGNOSIS — M549 Dorsalgia, unspecified: Secondary | ICD-10-CM | POA: Diagnosis not present

## 2013-08-17 DIAGNOSIS — M542 Cervicalgia: Secondary | ICD-10-CM | POA: Diagnosis not present

## 2013-08-20 DIAGNOSIS — N201 Calculus of ureter: Secondary | ICD-10-CM | POA: Diagnosis not present

## 2013-08-20 DIAGNOSIS — Z7982 Long term (current) use of aspirin: Secondary | ICD-10-CM | POA: Diagnosis not present

## 2013-08-20 DIAGNOSIS — E7800 Pure hypercholesterolemia, unspecified: Secondary | ICD-10-CM | POA: Diagnosis not present

## 2013-08-20 DIAGNOSIS — Z87442 Personal history of urinary calculi: Secondary | ICD-10-CM | POA: Diagnosis not present

## 2013-08-20 DIAGNOSIS — I2510 Atherosclerotic heart disease of native coronary artery without angina pectoris: Secondary | ICD-10-CM | POA: Diagnosis not present

## 2013-08-20 DIAGNOSIS — Z8509 Personal history of malignant neoplasm of other digestive organs: Secondary | ICD-10-CM | POA: Diagnosis not present

## 2013-08-20 DIAGNOSIS — N400 Enlarged prostate without lower urinary tract symptoms: Secondary | ICD-10-CM | POA: Diagnosis not present

## 2013-08-20 DIAGNOSIS — Z79899 Other long term (current) drug therapy: Secondary | ICD-10-CM | POA: Diagnosis not present

## 2013-08-20 DIAGNOSIS — K2970 Gastritis, unspecified, without bleeding: Secondary | ICD-10-CM | POA: Diagnosis not present

## 2013-08-20 DIAGNOSIS — K2990 Gastroduodenitis, unspecified, without bleeding: Secondary | ICD-10-CM | POA: Diagnosis not present

## 2013-08-20 DIAGNOSIS — N23 Unspecified renal colic: Secondary | ICD-10-CM | POA: Diagnosis not present

## 2013-08-20 DIAGNOSIS — N200 Calculus of kidney: Secondary | ICD-10-CM | POA: Diagnosis not present

## 2013-08-20 DIAGNOSIS — N1330 Unspecified hydronephrosis: Secondary | ICD-10-CM | POA: Diagnosis not present

## 2013-08-23 DIAGNOSIS — L988 Other specified disorders of the skin and subcutaneous tissue: Secondary | ICD-10-CM | POA: Diagnosis not present

## 2013-08-23 DIAGNOSIS — D485 Neoplasm of uncertain behavior of skin: Secondary | ICD-10-CM | POA: Diagnosis not present

## 2013-08-23 DIAGNOSIS — N200 Calculus of kidney: Secondary | ICD-10-CM | POA: Diagnosis not present

## 2013-08-23 DIAGNOSIS — Z85828 Personal history of other malignant neoplasm of skin: Secondary | ICD-10-CM | POA: Diagnosis not present

## 2013-08-25 DIAGNOSIS — R972 Elevated prostate specific antigen [PSA]: Secondary | ICD-10-CM | POA: Diagnosis not present

## 2013-08-25 DIAGNOSIS — Z9889 Other specified postprocedural states: Secondary | ICD-10-CM | POA: Diagnosis not present

## 2013-08-25 LAB — PSA, DIAGNOSTIC (PROSTATE SPECIFIC AG): Prostate Specific Ag: 0.5 ng/mL (ref 0.0–4.0)

## 2013-09-02 DIAGNOSIS — C44621 Squamous cell carcinoma of skin of unspecified upper limb, including shoulder: Secondary | ICD-10-CM | POA: Diagnosis not present

## 2013-09-22 DIAGNOSIS — C44319 Basal cell carcinoma of skin of other parts of face: Secondary | ICD-10-CM | POA: Diagnosis not present

## 2013-09-22 DIAGNOSIS — L570 Actinic keratosis: Secondary | ICD-10-CM | POA: Diagnosis not present

## 2013-09-22 DIAGNOSIS — D485 Neoplasm of uncertain behavior of skin: Secondary | ICD-10-CM | POA: Diagnosis not present

## 2013-09-22 DIAGNOSIS — Z85828 Personal history of other malignant neoplasm of skin: Secondary | ICD-10-CM | POA: Diagnosis not present

## 2013-10-11 DIAGNOSIS — Z9889 Other specified postprocedural states: Secondary | ICD-10-CM | POA: Diagnosis not present

## 2013-10-14 DIAGNOSIS — C44319 Basal cell carcinoma of skin of other parts of face: Secondary | ICD-10-CM | POA: Diagnosis not present

## 2013-10-16 DIAGNOSIS — E876 Hypokalemia: Secondary | ICD-10-CM | POA: Diagnosis not present

## 2013-10-16 DIAGNOSIS — A419 Sepsis, unspecified organism: Secondary | ICD-10-CM | POA: Diagnosis not present

## 2013-10-20 IMAGING — XA IR FLUORO GUIDE CV LINE*R*
1 series · 1 of 1 positions shown · non-contrast
Comparison: none

CLINICAL DATA: Pancreatic carcinoma.  Port-A-Cath required for
additional chemotherapy.

[Series 300: ir fluoro guide cv line*r* · 1 of 1 slices shown]
[im 1/1]
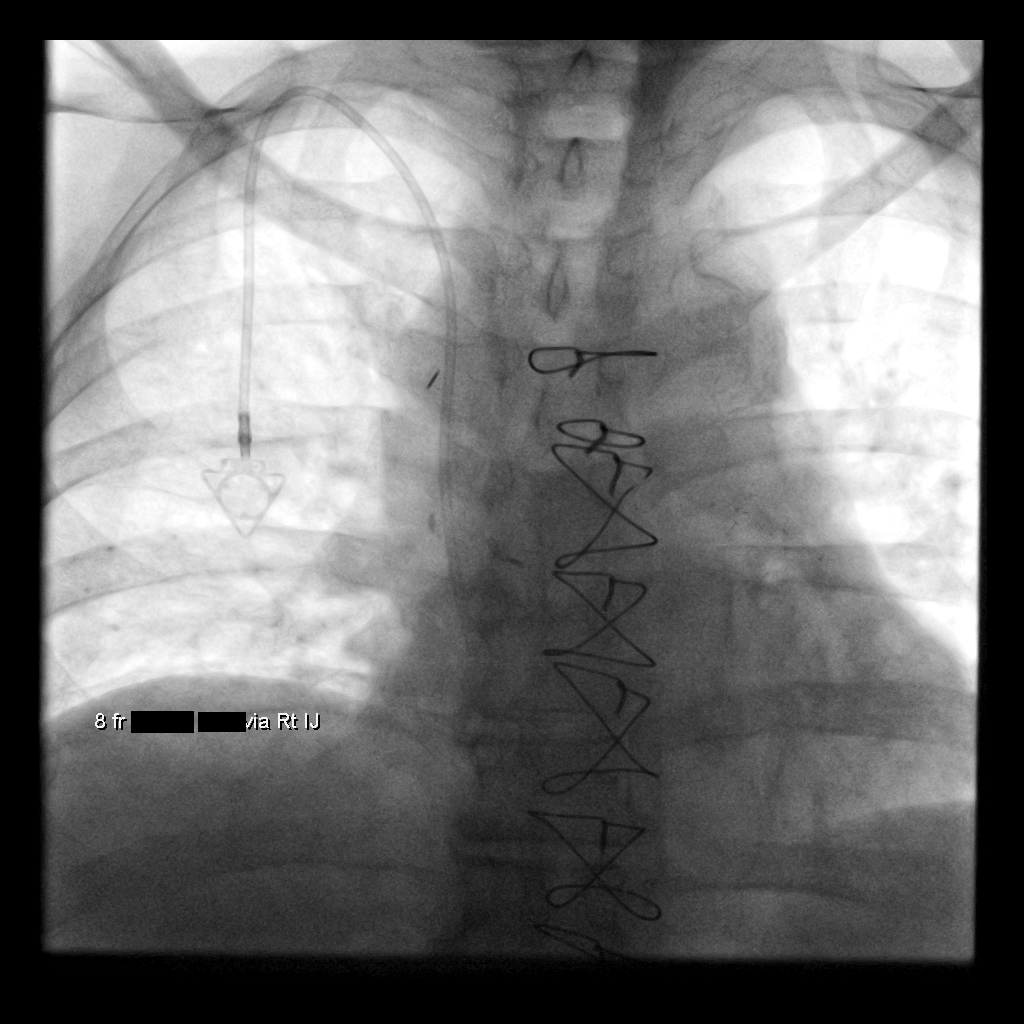

[1 of 1 positions shown; findings below may reference images not displayed]

IMPLANTED PORT A CATH PLACEMENT WITH ULTRASOUND AND FLUOROSCOPIC
GUIDANCE

Sedation:  4.0 mg IV Versed; 200 mcg IV Fentanyl.

Total Moderate Sedation Time:  50 minutes.

Additional Medications:  1 gram IV Ancef.  As antibiotic
prophylaxis, Ancef 1 gm was ordered pre-procedure and administered
intravenously within one hour of incision.

Fluoroscopy Time:  0.6 minutes.

Procedure:  The procedure, risks, benefits, and alternatives were
explained to the patient.  Questions regarding the procedure were
encouraged and answered.  The patient understands and consents to
the procedure.

The right neck and chest were prepped with chlorhexidine in a
sterile fashion, and a sterile drape was applied covering the
operative field.  Maximum barrier sterile technique with sterile
gowns and gloves were used for the procedure.  Local anesthesia was
provided with 1% lidocaine and lidocaine with epinephrine.

After creating a small venotomy incision, a 21 gauge needle was
advanced into the right internal jugular vein under direct, real-
time ultrasound guidance.  Ultrasound image documentation was
performed.  After securing guidewire access, an 8 Fr dilator was
placed.  A J-wire was kinked to measure appropriate catheter
length.

A subcutaneous port pocket was then created along the upper chest
wall utilizing sharp and blunt dissection.  Portable cautery was
utilized.  The pocket was irrigated with sterile saline.

A single lumen power injectable port was chosen for placement.  The
8 Fr catheter was tunneled from the port pocket site to the
venotomy incision.  The port was placed in the pocket and secured
with two Ethilon tacking sutures.  External catheter was trimmed to
appropriate length based on guidewire measurement.

At the venotomy, an 8 Fr peel-away sheath was placed over a
guidewire.  The catheter was then placed through the sheath and the
sheath removed.  Final catheter positioning was confirmed and
documented with a fluoroscopic spot image.  The port was accessed
with a needle and aspirated and flushed with heparinized saline.
The needle was left in as the patient is starting chemotherapy in
the morning.

The venotomy and port pocket incisions were closed with
subcutaneous 3-0 Monocryl and subcuticular 4-0 Vicryl.  Dermabond
was applied to both incisions.

Complications: None.  No pneumothorax.
FINDINGS: After catheter placement, the tip lies at the cavoatrial
junction.  The catheter aspirates normally and is ready for
immediate use.
IMPRESSION: Placement of single lumen port a cath via right internal jugular
vein.  The catheter tip lies at the cavoatrial junction.  A power
injectable port a cath was placed and is ready for immediate use.

## 2013-10-26 DIAGNOSIS — I10 Essential (primary) hypertension: Secondary | ICD-10-CM | POA: Diagnosis not present

## 2013-10-26 DIAGNOSIS — R5383 Other fatigue: Secondary | ICD-10-CM | POA: Diagnosis not present

## 2013-10-26 DIAGNOSIS — IMO0001 Reserved for inherently not codable concepts without codable children: Secondary | ICD-10-CM | POA: Diagnosis not present

## 2013-10-26 DIAGNOSIS — R5381 Other malaise: Secondary | ICD-10-CM | POA: Diagnosis not present

## 2013-10-26 DIAGNOSIS — D518 Other vitamin B12 deficiency anemias: Secondary | ICD-10-CM | POA: Diagnosis not present

## 2013-10-27 DIAGNOSIS — L570 Actinic keratosis: Secondary | ICD-10-CM | POA: Diagnosis not present

## 2013-10-27 DIAGNOSIS — Z85828 Personal history of other malignant neoplasm of skin: Secondary | ICD-10-CM | POA: Diagnosis not present

## 2013-11-03 ENCOUNTER — Other Ambulatory Visit: Payer: Self-pay | Admitting: Neurosurgery

## 2013-11-03 DIAGNOSIS — G8929 Other chronic pain: Secondary | ICD-10-CM

## 2013-11-03 DIAGNOSIS — M545 Low back pain: Principal | ICD-10-CM

## 2013-11-11 ENCOUNTER — Ambulatory Visit
Admission: RE | Admit: 2013-11-11 | Discharge: 2013-11-11 | Disposition: A | Discharge status: Home / Self Care | Payer: Medicare Other | Source: Ambulatory Visit | Attending: Neurosurgery | Admitting: Neurosurgery

## 2013-11-11 DIAGNOSIS — M545 Low back pain, unspecified: Secondary | ICD-10-CM

## 2013-11-11 DIAGNOSIS — G8929 Other chronic pain: Secondary | ICD-10-CM

## 2013-11-11 DIAGNOSIS — M48061 Spinal stenosis, lumbar region without neurogenic claudication: Secondary | ICD-10-CM | POA: Diagnosis not present

## 2013-11-11 DIAGNOSIS — M5126 Other intervertebral disc displacement, lumbar region: Secondary | ICD-10-CM | POA: Diagnosis not present

## 2013-11-11 DIAGNOSIS — M4310 Spondylolisthesis, site unspecified: Secondary | ICD-10-CM | POA: Diagnosis not present

## 2013-11-11 MED ORDER — DIAZEPAM 5 MG PO TABS
10.0000 mg | ORAL_TABLET | Freq: Once | ORAL | Status: AC
  Administered 2013-11-11: 5 mg via ORAL

## 2013-11-11 MED ORDER — IOHEXOL 180 MG/ML  SOLN
15.0000 mL | Freq: Once | INTRAMUSCULAR | Status: AC | PRN
  Administered 2013-11-11: 15 mL via INTRATHECAL

## 2013-11-11 NOTE — Discharge Instructions (Signed)

## 2013-11-15 DIAGNOSIS — N200 Calculus of kidney: Secondary | ICD-10-CM | POA: Diagnosis not present

## 2013-11-16 DIAGNOSIS — Z85828 Personal history of other malignant neoplasm of skin: Secondary | ICD-10-CM | POA: Diagnosis not present

## 2013-11-16 DIAGNOSIS — L570 Actinic keratosis: Secondary | ICD-10-CM | POA: Diagnosis not present

## 2013-11-17 DIAGNOSIS — Z9889 Other specified postprocedural states: Secondary | ICD-10-CM | POA: Diagnosis not present

## 2013-12-06 DIAGNOSIS — Z23 Encounter for immunization: Secondary | ICD-10-CM | POA: Diagnosis not present

## 2013-12-23 DIAGNOSIS — C250 Malignant neoplasm of head of pancreas: Secondary | ICD-10-CM | POA: Diagnosis not present

## 2013-12-23 DIAGNOSIS — N202 Calculus of kidney with calculus of ureter: Secondary | ICD-10-CM | POA: Diagnosis not present

## 2013-12-23 DIAGNOSIS — K769 Liver disease, unspecified: Secondary | ICD-10-CM | POA: Diagnosis not present

## 2013-12-23 DIAGNOSIS — C259 Malignant neoplasm of pancreas, unspecified: Secondary | ICD-10-CM | POA: Diagnosis not present

## 2013-12-23 DIAGNOSIS — K7689 Other specified diseases of liver: Secondary | ICD-10-CM | POA: Diagnosis not present

## 2013-12-29 DIAGNOSIS — Z95828 Presence of other vascular implants and grafts: Secondary | ICD-10-CM | POA: Diagnosis not present

## 2014-01-06 DIAGNOSIS — M4806 Spinal stenosis, lumbar region: Secondary | ICD-10-CM | POA: Diagnosis not present

## 2014-01-06 DIAGNOSIS — M545 Low back pain: Secondary | ICD-10-CM | POA: Diagnosis not present

## 2014-01-06 DIAGNOSIS — Z6829 Body mass index (BMI) 29.0-29.9, adult: Secondary | ICD-10-CM | POA: Diagnosis not present

## 2014-01-06 DIAGNOSIS — M5416 Radiculopathy, lumbar region: Secondary | ICD-10-CM | POA: Diagnosis not present

## 2014-01-17 DIAGNOSIS — I10 Essential (primary) hypertension: Secondary | ICD-10-CM | POA: Diagnosis not present

## 2014-01-17 DIAGNOSIS — Z131 Encounter for screening for diabetes mellitus: Secondary | ICD-10-CM | POA: Diagnosis not present

## 2014-02-09 DIAGNOSIS — C259 Malignant neoplasm of pancreas, unspecified: Secondary | ICD-10-CM | POA: Diagnosis not present

## 2014-02-09 DIAGNOSIS — Z95828 Presence of other vascular implants and grafts: Secondary | ICD-10-CM | POA: Diagnosis not present

## 2014-03-07 DIAGNOSIS — N5203 Combined arterial insufficiency and corporo-venous occlusive erectile dysfunction: Secondary | ICD-10-CM | POA: Diagnosis not present

## 2014-03-07 DIAGNOSIS — N200 Calculus of kidney: Secondary | ICD-10-CM | POA: Diagnosis not present

## 2014-03-07 DIAGNOSIS — R3915 Urgency of urination: Secondary | ICD-10-CM | POA: Diagnosis not present

## 2014-03-07 DIAGNOSIS — R3919 Other difficulties with micturition: Secondary | ICD-10-CM | POA: Diagnosis not present

## 2014-03-08 DIAGNOSIS — I10 Essential (primary) hypertension: Secondary | ICD-10-CM | POA: Diagnosis not present

## 2014-03-08 DIAGNOSIS — G459 Transient cerebral ischemic attack, unspecified: Secondary | ICD-10-CM | POA: Diagnosis not present

## 2014-03-09 DIAGNOSIS — M6080 Other myositis, unspecified site: Secondary | ICD-10-CM | POA: Diagnosis not present

## 2014-03-09 DIAGNOSIS — Z131 Encounter for screening for diabetes mellitus: Secondary | ICD-10-CM | POA: Diagnosis not present

## 2014-03-09 DIAGNOSIS — G459 Transient cerebral ischemic attack, unspecified: Secondary | ICD-10-CM | POA: Diagnosis not present

## 2014-03-09 DIAGNOSIS — I10 Essential (primary) hypertension: Secondary | ICD-10-CM | POA: Diagnosis not present

## 2014-03-09 DIAGNOSIS — D518 Other vitamin B12 deficiency anemias: Secondary | ICD-10-CM | POA: Diagnosis not present

## 2014-03-09 DIAGNOSIS — Z79899 Other long term (current) drug therapy: Secondary | ICD-10-CM | POA: Diagnosis not present

## 2014-03-09 DIAGNOSIS — N200 Calculus of kidney: Secondary | ICD-10-CM | POA: Diagnosis not present

## 2014-03-14 DIAGNOSIS — Z8673 Personal history of transient ischemic attack (TIA), and cerebral infarction without residual deficits: Secondary | ICD-10-CM | POA: Diagnosis not present

## 2014-03-14 DIAGNOSIS — I6523 Occlusion and stenosis of bilateral carotid arteries: Secondary | ICD-10-CM | POA: Diagnosis not present

## 2014-03-14 DIAGNOSIS — I252 Old myocardial infarction: Secondary | ICD-10-CM | POA: Diagnosis not present

## 2014-03-14 DIAGNOSIS — R51 Headache: Secondary | ICD-10-CM | POA: Diagnosis not present

## 2014-03-14 DIAGNOSIS — R938 Abnormal findings on diagnostic imaging of other specified body structures: Secondary | ICD-10-CM | POA: Diagnosis not present

## 2014-03-14 DIAGNOSIS — I10 Essential (primary) hypertension: Secondary | ICD-10-CM | POA: Diagnosis not present

## 2014-03-16 DIAGNOSIS — D485 Neoplasm of uncertain behavior of skin: Secondary | ICD-10-CM | POA: Diagnosis not present

## 2014-03-16 DIAGNOSIS — L570 Actinic keratosis: Secondary | ICD-10-CM | POA: Diagnosis not present

## 2014-03-16 DIAGNOSIS — L729 Follicular cyst of the skin and subcutaneous tissue, unspecified: Secondary | ICD-10-CM | POA: Diagnosis not present

## 2014-03-16 DIAGNOSIS — L309 Dermatitis, unspecified: Secondary | ICD-10-CM | POA: Diagnosis not present

## 2014-03-22 DIAGNOSIS — I351 Nonrheumatic aortic (valve) insufficiency: Secondary | ICD-10-CM | POA: Diagnosis not present

## 2014-03-22 DIAGNOSIS — I10 Essential (primary) hypertension: Secondary | ICD-10-CM | POA: Diagnosis not present

## 2014-03-22 DIAGNOSIS — R931 Abnormal findings on diagnostic imaging of heart and coronary circulation: Secondary | ICD-10-CM | POA: Diagnosis not present

## 2014-03-22 DIAGNOSIS — Z955 Presence of coronary angioplasty implant and graft: Secondary | ICD-10-CM | POA: Diagnosis not present

## 2014-03-22 DIAGNOSIS — Z951 Presence of aortocoronary bypass graft: Secondary | ICD-10-CM | POA: Diagnosis not present

## 2014-03-22 DIAGNOSIS — R55 Syncope and collapse: Secondary | ICD-10-CM | POA: Diagnosis not present

## 2014-03-23 DIAGNOSIS — C259 Malignant neoplasm of pancreas, unspecified: Secondary | ICD-10-CM | POA: Diagnosis not present

## 2014-04-18 DIAGNOSIS — J300 Vasomotor rhinitis: Secondary | ICD-10-CM | POA: Diagnosis not present

## 2014-05-02 DIAGNOSIS — L570 Actinic keratosis: Secondary | ICD-10-CM | POA: Diagnosis not present

## 2014-05-02 DIAGNOSIS — Z85828 Personal history of other malignant neoplasm of skin: Secondary | ICD-10-CM | POA: Diagnosis not present

## 2014-05-04 DIAGNOSIS — C259 Malignant neoplasm of pancreas, unspecified: Secondary | ICD-10-CM | POA: Diagnosis not present

## 2014-05-05 DIAGNOSIS — J329 Chronic sinusitis, unspecified: Secondary | ICD-10-CM | POA: Diagnosis not present

## 2014-05-05 DIAGNOSIS — R040 Epistaxis: Secondary | ICD-10-CM | POA: Diagnosis not present

## 2014-05-17 DIAGNOSIS — R0989 Other specified symptoms and signs involving the circulatory and respiratory systems: Secondary | ICD-10-CM | POA: Diagnosis not present

## 2014-05-17 DIAGNOSIS — J342 Deviated nasal septum: Secondary | ICD-10-CM | POA: Diagnosis not present

## 2014-05-17 DIAGNOSIS — J329 Chronic sinusitis, unspecified: Secondary | ICD-10-CM | POA: Diagnosis not present

## 2014-05-17 DIAGNOSIS — R51 Headache: Secondary | ICD-10-CM | POA: Diagnosis not present

## 2014-05-23 DIAGNOSIS — R3915 Urgency of urination: Secondary | ICD-10-CM | POA: Diagnosis not present

## 2014-05-24 DIAGNOSIS — M545 Low back pain: Secondary | ICD-10-CM | POA: Diagnosis not present

## 2014-05-24 DIAGNOSIS — M5432 Sciatica, left side: Secondary | ICD-10-CM | POA: Diagnosis not present

## 2014-05-31 DIAGNOSIS — N200 Calculus of kidney: Secondary | ICD-10-CM | POA: Diagnosis not present

## 2014-06-01 DIAGNOSIS — J342 Deviated nasal septum: Secondary | ICD-10-CM | POA: Diagnosis not present

## 2014-06-01 DIAGNOSIS — R040 Epistaxis: Secondary | ICD-10-CM | POA: Diagnosis not present

## 2014-06-01 DIAGNOSIS — J300 Vasomotor rhinitis: Secondary | ICD-10-CM | POA: Diagnosis not present

## 2014-06-14 DIAGNOSIS — H2513 Age-related nuclear cataract, bilateral: Secondary | ICD-10-CM | POA: Diagnosis not present

## 2014-06-15 DIAGNOSIS — C259 Malignant neoplasm of pancreas, unspecified: Secondary | ICD-10-CM | POA: Diagnosis not present

## 2014-06-15 DIAGNOSIS — Z131 Encounter for screening for diabetes mellitus: Secondary | ICD-10-CM | POA: Diagnosis not present

## 2014-06-15 DIAGNOSIS — Z95828 Presence of other vascular implants and grafts: Secondary | ICD-10-CM | POA: Diagnosis not present

## 2014-06-16 DIAGNOSIS — D490 Neoplasm of unspecified behavior of digestive system: Secondary | ICD-10-CM | POA: Diagnosis not present

## 2014-06-16 DIAGNOSIS — K760 Fatty (change of) liver, not elsewhere classified: Secondary | ICD-10-CM | POA: Diagnosis not present

## 2014-06-16 DIAGNOSIS — N200 Calculus of kidney: Secondary | ICD-10-CM | POA: Diagnosis not present

## 2014-06-16 DIAGNOSIS — N281 Cyst of kidney, acquired: Secondary | ICD-10-CM | POA: Diagnosis not present

## 2014-06-16 DIAGNOSIS — C250 Malignant neoplasm of head of pancreas: Secondary | ICD-10-CM | POA: Diagnosis not present

## 2014-06-16 DIAGNOSIS — Z9889 Other specified postprocedural states: Secondary | ICD-10-CM | POA: Diagnosis not present

## 2014-06-16 DIAGNOSIS — I10 Essential (primary) hypertension: Secondary | ICD-10-CM | POA: Diagnosis not present

## 2014-06-16 DIAGNOSIS — C259 Malignant neoplasm of pancreas, unspecified: Secondary | ICD-10-CM | POA: Diagnosis not present

## 2014-06-16 DIAGNOSIS — Z8719 Personal history of other diseases of the digestive system: Secondary | ICD-10-CM | POA: Diagnosis not present

## 2014-06-16 DIAGNOSIS — R160 Hepatomegaly, not elsewhere classified: Secondary | ICD-10-CM | POA: Diagnosis not present

## 2014-06-16 DIAGNOSIS — Z8507 Personal history of malignant neoplasm of pancreas: Secondary | ICD-10-CM | POA: Diagnosis not present

## 2014-07-04 DIAGNOSIS — C259 Malignant neoplasm of pancreas, unspecified: Secondary | ICD-10-CM | POA: Diagnosis not present

## 2014-07-15 DIAGNOSIS — M1711 Unilateral primary osteoarthritis, right knee: Secondary | ICD-10-CM | POA: Diagnosis not present

## 2014-07-15 DIAGNOSIS — M179 Osteoarthritis of knee, unspecified: Secondary | ICD-10-CM | POA: Diagnosis not present

## 2014-07-15 DIAGNOSIS — J449 Chronic obstructive pulmonary disease, unspecified: Secondary | ICD-10-CM | POA: Diagnosis not present

## 2014-07-15 DIAGNOSIS — Z8507 Personal history of malignant neoplasm of pancreas: Secondary | ICD-10-CM | POA: Diagnosis not present

## 2014-07-15 DIAGNOSIS — Z452 Encounter for adjustment and management of vascular access device: Secondary | ICD-10-CM | POA: Diagnosis not present

## 2014-07-15 DIAGNOSIS — Z87442 Personal history of urinary calculi: Secondary | ICD-10-CM | POA: Diagnosis not present

## 2014-07-15 DIAGNOSIS — C259 Malignant neoplasm of pancreas, unspecified: Secondary | ICD-10-CM | POA: Diagnosis not present

## 2014-07-15 DIAGNOSIS — S8001XA Contusion of right knee, initial encounter: Secondary | ICD-10-CM | POA: Diagnosis not present

## 2014-07-15 DIAGNOSIS — Z886 Allergy status to analgesic agent status: Secondary | ICD-10-CM | POA: Diagnosis not present

## 2014-07-15 DIAGNOSIS — Z7982 Long term (current) use of aspirin: Secondary | ICD-10-CM | POA: Diagnosis not present

## 2014-07-15 DIAGNOSIS — Z9221 Personal history of antineoplastic chemotherapy: Secondary | ICD-10-CM | POA: Diagnosis not present

## 2014-07-15 DIAGNOSIS — I10 Essential (primary) hypertension: Secondary | ICD-10-CM | POA: Diagnosis not present

## 2014-07-15 DIAGNOSIS — I252 Old myocardial infarction: Secondary | ICD-10-CM | POA: Diagnosis not present

## 2014-07-15 DIAGNOSIS — Z951 Presence of aortocoronary bypass graft: Secondary | ICD-10-CM | POA: Diagnosis not present

## 2014-07-15 DIAGNOSIS — Z79899 Other long term (current) drug therapy: Secondary | ICD-10-CM | POA: Diagnosis not present

## 2014-07-15 DIAGNOSIS — Z923 Personal history of irradiation: Secondary | ICD-10-CM | POA: Diagnosis not present

## 2014-09-15 DIAGNOSIS — Z79899 Other long term (current) drug therapy: Secondary | ICD-10-CM | POA: Diagnosis not present

## 2014-09-15 DIAGNOSIS — I2510 Atherosclerotic heart disease of native coronary artery without angina pectoris: Secondary | ICD-10-CM | POA: Diagnosis not present

## 2014-09-15 DIAGNOSIS — Z6822 Body mass index (BMI) 22.0-22.9, adult: Secondary | ICD-10-CM | POA: Diagnosis not present

## 2014-09-15 DIAGNOSIS — N400 Enlarged prostate without lower urinary tract symptoms: Secondary | ICD-10-CM | POA: Diagnosis not present

## 2014-09-15 DIAGNOSIS — K3184 Gastroparesis: Secondary | ICD-10-CM | POA: Diagnosis not present

## 2014-09-15 DIAGNOSIS — Z7982 Long term (current) use of aspirin: Secondary | ICD-10-CM | POA: Diagnosis not present

## 2014-09-15 DIAGNOSIS — R945 Abnormal results of liver function studies: Secondary | ICD-10-CM | POA: Diagnosis not present

## 2014-09-15 DIAGNOSIS — Z8507 Personal history of malignant neoplasm of pancreas: Secondary | ICD-10-CM | POA: Diagnosis not present

## 2014-09-15 DIAGNOSIS — K760 Fatty (change of) liver, not elsewhere classified: Secondary | ICD-10-CM | POA: Diagnosis not present

## 2014-09-15 DIAGNOSIS — N200 Calculus of kidney: Secondary | ICD-10-CM | POA: Diagnosis not present

## 2014-09-15 DIAGNOSIS — R630 Anorexia: Secondary | ICD-10-CM | POA: Diagnosis not present

## 2014-09-15 DIAGNOSIS — I10 Essential (primary) hypertension: Secondary | ICD-10-CM | POA: Diagnosis not present

## 2014-09-15 DIAGNOSIS — K5730 Diverticulosis of large intestine without perforation or abscess without bleeding: Secondary | ICD-10-CM | POA: Diagnosis not present

## 2014-09-15 DIAGNOSIS — Z79818 Long term (current) use of other agents affecting estrogen receptors and estrogen levels: Secondary | ICD-10-CM | POA: Diagnosis not present

## 2014-09-15 DIAGNOSIS — R109 Unspecified abdominal pain: Secondary | ICD-10-CM | POA: Diagnosis not present

## 2014-09-15 DIAGNOSIS — E785 Hyperlipidemia, unspecified: Secondary | ICD-10-CM | POA: Diagnosis not present

## 2014-09-15 DIAGNOSIS — M47813 Spondylosis without myelopathy or radiculopathy, cervicothoracic region: Secondary | ICD-10-CM | POA: Diagnosis not present

## 2014-09-15 DIAGNOSIS — K861 Other chronic pancreatitis: Secondary | ICD-10-CM | POA: Diagnosis not present

## 2014-09-16 DIAGNOSIS — R109 Unspecified abdominal pain: Secondary | ICD-10-CM | POA: Diagnosis not present

## 2014-09-16 DIAGNOSIS — R630 Anorexia: Secondary | ICD-10-CM | POA: Diagnosis not present

## 2014-09-16 DIAGNOSIS — Z6822 Body mass index (BMI) 22.0-22.9, adult: Secondary | ICD-10-CM | POA: Diagnosis not present

## 2014-09-16 DIAGNOSIS — R945 Abnormal results of liver function studies: Secondary | ICD-10-CM | POA: Diagnosis not present

## 2014-09-16 DIAGNOSIS — I10 Essential (primary) hypertension: Secondary | ICD-10-CM | POA: Diagnosis not present

## 2014-09-16 DIAGNOSIS — K3184 Gastroparesis: Secondary | ICD-10-CM | POA: Diagnosis not present

## 2014-09-17 DIAGNOSIS — R109 Unspecified abdominal pain: Secondary | ICD-10-CM | POA: Diagnosis not present

## 2014-09-17 DIAGNOSIS — R630 Anorexia: Secondary | ICD-10-CM | POA: Diagnosis not present

## 2014-09-17 DIAGNOSIS — R945 Abnormal results of liver function studies: Secondary | ICD-10-CM | POA: Diagnosis not present

## 2014-09-17 DIAGNOSIS — K3184 Gastroparesis: Secondary | ICD-10-CM | POA: Diagnosis not present

## 2014-09-17 DIAGNOSIS — I10 Essential (primary) hypertension: Secondary | ICD-10-CM | POA: Diagnosis not present

## 2014-09-17 DIAGNOSIS — Z6822 Body mass index (BMI) 22.0-22.9, adult: Secondary | ICD-10-CM | POA: Diagnosis not present

## 2014-09-18 DIAGNOSIS — R630 Anorexia: Secondary | ICD-10-CM | POA: Diagnosis not present

## 2014-09-18 DIAGNOSIS — Z6822 Body mass index (BMI) 22.0-22.9, adult: Secondary | ICD-10-CM | POA: Diagnosis not present

## 2014-09-18 DIAGNOSIS — I10 Essential (primary) hypertension: Secondary | ICD-10-CM | POA: Diagnosis not present

## 2014-09-18 DIAGNOSIS — R109 Unspecified abdominal pain: Secondary | ICD-10-CM | POA: Diagnosis not present

## 2014-09-18 DIAGNOSIS — R945 Abnormal results of liver function studies: Secondary | ICD-10-CM | POA: Diagnosis not present

## 2014-09-18 DIAGNOSIS — K3184 Gastroparesis: Secondary | ICD-10-CM | POA: Diagnosis not present

## 2014-09-19 DIAGNOSIS — R630 Anorexia: Secondary | ICD-10-CM | POA: Diagnosis not present

## 2014-09-19 DIAGNOSIS — R945 Abnormal results of liver function studies: Secondary | ICD-10-CM | POA: Diagnosis not present

## 2014-09-19 DIAGNOSIS — K3184 Gastroparesis: Secondary | ICD-10-CM | POA: Diagnosis not present

## 2014-09-19 DIAGNOSIS — I10 Essential (primary) hypertension: Secondary | ICD-10-CM | POA: Diagnosis not present

## 2014-09-19 DIAGNOSIS — R109 Unspecified abdominal pain: Secondary | ICD-10-CM | POA: Diagnosis not present

## 2014-09-19 DIAGNOSIS — Z6822 Body mass index (BMI) 22.0-22.9, adult: Secondary | ICD-10-CM | POA: Diagnosis not present

## 2014-09-26 DIAGNOSIS — M6080 Other myositis, unspecified site: Secondary | ICD-10-CM | POA: Diagnosis not present

## 2014-09-26 DIAGNOSIS — K2900 Acute gastritis without bleeding: Secondary | ICD-10-CM | POA: Diagnosis not present

## 2014-10-06 DIAGNOSIS — M545 Low back pain: Secondary | ICD-10-CM | POA: Diagnosis not present

## 2014-10-06 DIAGNOSIS — Z6830 Body mass index (BMI) 30.0-30.9, adult: Secondary | ICD-10-CM | POA: Diagnosis not present

## 2014-10-06 DIAGNOSIS — M542 Cervicalgia: Secondary | ICD-10-CM | POA: Diagnosis not present

## 2014-11-02 DIAGNOSIS — Z85828 Personal history of other malignant neoplasm of skin: Secondary | ICD-10-CM | POA: Diagnosis not present

## 2014-11-02 DIAGNOSIS — L570 Actinic keratosis: Secondary | ICD-10-CM | POA: Diagnosis not present

## 2014-11-08 DIAGNOSIS — Z23 Encounter for immunization: Secondary | ICD-10-CM | POA: Diagnosis not present

## 2014-12-01 DIAGNOSIS — N23 Unspecified renal colic: Secondary | ICD-10-CM | POA: Diagnosis not present

## 2014-12-01 DIAGNOSIS — N200 Calculus of kidney: Secondary | ICD-10-CM | POA: Diagnosis not present

## 2014-12-01 DIAGNOSIS — I10 Essential (primary) hypertension: Secondary | ICD-10-CM | POA: Diagnosis not present

## 2014-12-01 DIAGNOSIS — E7800 Pure hypercholesterolemia, unspecified: Secondary | ICD-10-CM | POA: Diagnosis not present

## 2014-12-01 DIAGNOSIS — Z87442 Personal history of urinary calculi: Secondary | ICD-10-CM | POA: Diagnosis not present

## 2014-12-01 DIAGNOSIS — N179 Acute kidney failure, unspecified: Secondary | ICD-10-CM | POA: Diagnosis not present

## 2014-12-01 DIAGNOSIS — N400 Enlarged prostate without lower urinary tract symptoms: Secondary | ICD-10-CM | POA: Diagnosis not present

## 2014-12-01 DIAGNOSIS — Z7982 Long term (current) use of aspirin: Secondary | ICD-10-CM | POA: Diagnosis not present

## 2014-12-01 DIAGNOSIS — N201 Calculus of ureter: Secondary | ICD-10-CM | POA: Diagnosis not present

## 2014-12-01 DIAGNOSIS — R109 Unspecified abdominal pain: Secondary | ICD-10-CM | POA: Diagnosis not present

## 2014-12-01 DIAGNOSIS — E785 Hyperlipidemia, unspecified: Secondary | ICD-10-CM | POA: Diagnosis not present

## 2014-12-01 DIAGNOSIS — R1031 Right lower quadrant pain: Secondary | ICD-10-CM | POA: Diagnosis not present

## 2014-12-01 DIAGNOSIS — Z8507 Personal history of malignant neoplasm of pancreas: Secondary | ICD-10-CM | POA: Diagnosis not present

## 2014-12-01 DIAGNOSIS — I2510 Atherosclerotic heart disease of native coronary artery without angina pectoris: Secondary | ICD-10-CM | POA: Diagnosis not present

## 2014-12-01 DIAGNOSIS — E870 Hyperosmolality and hypernatremia: Secondary | ICD-10-CM | POA: Diagnosis not present

## 2014-12-01 DIAGNOSIS — R8299 Other abnormal findings in urine: Secondary | ICD-10-CM | POA: Diagnosis not present

## 2014-12-01 DIAGNOSIS — Z79899 Other long term (current) drug therapy: Secondary | ICD-10-CM | POA: Diagnosis not present

## 2014-12-01 DIAGNOSIS — Z951 Presence of aortocoronary bypass graft: Secondary | ICD-10-CM | POA: Diagnosis not present

## 2014-12-01 DIAGNOSIS — M1990 Unspecified osteoarthritis, unspecified site: Secondary | ICD-10-CM | POA: Diagnosis not present

## 2014-12-02 DIAGNOSIS — N400 Enlarged prostate without lower urinary tract symptoms: Secondary | ICD-10-CM | POA: Diagnosis not present

## 2014-12-02 DIAGNOSIS — N179 Acute kidney failure, unspecified: Secondary | ICD-10-CM | POA: Diagnosis not present

## 2014-12-02 DIAGNOSIS — M170 Bilateral primary osteoarthritis of knee: Secondary | ICD-10-CM | POA: Diagnosis not present

## 2014-12-02 DIAGNOSIS — I129 Hypertensive chronic kidney disease with stage 1 through stage 4 chronic kidney disease, or unspecified chronic kidney disease: Secondary | ICD-10-CM | POA: Diagnosis not present

## 2014-12-02 DIAGNOSIS — M1990 Unspecified osteoarthritis, unspecified site: Secondary | ICD-10-CM | POA: Diagnosis not present

## 2014-12-02 DIAGNOSIS — N23 Unspecified renal colic: Secondary | ICD-10-CM | POA: Diagnosis not present

## 2014-12-02 DIAGNOSIS — I2510 Atherosclerotic heart disease of native coronary artery without angina pectoris: Secondary | ICD-10-CM | POA: Diagnosis not present

## 2014-12-02 DIAGNOSIS — N201 Calculus of ureter: Secondary | ICD-10-CM | POA: Diagnosis not present

## 2014-12-02 DIAGNOSIS — I10 Essential (primary) hypertension: Secondary | ICD-10-CM | POA: Diagnosis not present

## 2014-12-02 DIAGNOSIS — E784 Other hyperlipidemia: Secondary | ICD-10-CM | POA: Diagnosis not present

## 2014-12-02 DIAGNOSIS — N200 Calculus of kidney: Secondary | ICD-10-CM | POA: Diagnosis not present

## 2014-12-02 DIAGNOSIS — R109 Unspecified abdominal pain: Secondary | ICD-10-CM | POA: Diagnosis not present

## 2014-12-02 DIAGNOSIS — Z8507 Personal history of malignant neoplasm of pancreas: Secondary | ICD-10-CM | POA: Diagnosis not present

## 2014-12-02 DIAGNOSIS — Z951 Presence of aortocoronary bypass graft: Secondary | ICD-10-CM | POA: Diagnosis not present

## 2014-12-02 DIAGNOSIS — E785 Hyperlipidemia, unspecified: Secondary | ICD-10-CM | POA: Diagnosis not present

## 2014-12-02 DIAGNOSIS — K861 Other chronic pancreatitis: Secondary | ICD-10-CM | POA: Diagnosis not present

## 2014-12-02 DIAGNOSIS — N189 Chronic kidney disease, unspecified: Secondary | ICD-10-CM | POA: Diagnosis not present

## 2014-12-03 DIAGNOSIS — I2510 Atherosclerotic heart disease of native coronary artery without angina pectoris: Secondary | ICD-10-CM | POA: Diagnosis not present

## 2014-12-03 DIAGNOSIS — N179 Acute kidney failure, unspecified: Secondary | ICD-10-CM | POA: Diagnosis not present

## 2014-12-03 DIAGNOSIS — E785 Hyperlipidemia, unspecified: Secondary | ICD-10-CM | POA: Diagnosis not present

## 2014-12-03 DIAGNOSIS — N400 Enlarged prostate without lower urinary tract symptoms: Secondary | ICD-10-CM | POA: Diagnosis not present

## 2014-12-03 DIAGNOSIS — Z8507 Personal history of malignant neoplasm of pancreas: Secondary | ICD-10-CM | POA: Diagnosis not present

## 2014-12-03 DIAGNOSIS — I10 Essential (primary) hypertension: Secondary | ICD-10-CM | POA: Diagnosis not present

## 2014-12-03 DIAGNOSIS — M170 Bilateral primary osteoarthritis of knee: Secondary | ICD-10-CM | POA: Diagnosis not present

## 2014-12-03 DIAGNOSIS — N200 Calculus of kidney: Secondary | ICD-10-CM | POA: Diagnosis not present

## 2014-12-03 DIAGNOSIS — Z951 Presence of aortocoronary bypass graft: Secondary | ICD-10-CM | POA: Diagnosis not present

## 2014-12-03 DIAGNOSIS — N201 Calculus of ureter: Secondary | ICD-10-CM | POA: Diagnosis not present

## 2014-12-03 DIAGNOSIS — E784 Other hyperlipidemia: Secondary | ICD-10-CM | POA: Diagnosis not present

## 2014-12-03 DIAGNOSIS — K861 Other chronic pancreatitis: Secondary | ICD-10-CM | POA: Diagnosis not present

## 2014-12-03 DIAGNOSIS — N189 Chronic kidney disease, unspecified: Secondary | ICD-10-CM | POA: Diagnosis not present

## 2014-12-03 DIAGNOSIS — I129 Hypertensive chronic kidney disease with stage 1 through stage 4 chronic kidney disease, or unspecified chronic kidney disease: Secondary | ICD-10-CM | POA: Diagnosis not present

## 2014-12-03 DIAGNOSIS — M1990 Unspecified osteoarthritis, unspecified site: Secondary | ICD-10-CM | POA: Diagnosis not present

## 2014-12-05 DIAGNOSIS — Z960 Presence of urogenital implants: Secondary | ICD-10-CM | POA: Diagnosis not present

## 2014-12-05 DIAGNOSIS — N202 Calculus of kidney with calculus of ureter: Secondary | ICD-10-CM | POA: Diagnosis not present

## 2014-12-05 DIAGNOSIS — N200 Calculus of kidney: Secondary | ICD-10-CM | POA: Diagnosis not present

## 2014-12-05 DIAGNOSIS — R109 Unspecified abdominal pain: Secondary | ICD-10-CM | POA: Diagnosis not present

## 2014-12-07 DIAGNOSIS — Z951 Presence of aortocoronary bypass graft: Secondary | ICD-10-CM | POA: Diagnosis not present

## 2014-12-07 DIAGNOSIS — Z808 Family history of malignant neoplasm of other organs or systems: Secondary | ICD-10-CM | POA: Diagnosis not present

## 2014-12-07 DIAGNOSIS — Z8507 Personal history of malignant neoplasm of pancreas: Secondary | ICD-10-CM | POA: Diagnosis not present

## 2014-12-07 DIAGNOSIS — E7800 Pure hypercholesterolemia, unspecified: Secondary | ICD-10-CM | POA: Diagnosis not present

## 2014-12-07 DIAGNOSIS — Z87442 Personal history of urinary calculi: Secondary | ICD-10-CM | POA: Diagnosis not present

## 2014-12-07 DIAGNOSIS — I2510 Atherosclerotic heart disease of native coronary artery without angina pectoris: Secondary | ICD-10-CM | POA: Diagnosis not present

## 2014-12-07 DIAGNOSIS — Z981 Arthrodesis status: Secondary | ICD-10-CM | POA: Diagnosis not present

## 2014-12-07 DIAGNOSIS — Z888 Allergy status to other drugs, medicaments and biological substances status: Secondary | ICD-10-CM | POA: Diagnosis not present

## 2014-12-07 DIAGNOSIS — I10 Essential (primary) hypertension: Secondary | ICD-10-CM | POA: Diagnosis not present

## 2014-12-07 DIAGNOSIS — Z79899 Other long term (current) drug therapy: Secondary | ICD-10-CM | POA: Diagnosis not present

## 2014-12-07 DIAGNOSIS — Z85828 Personal history of other malignant neoplasm of skin: Secondary | ICD-10-CM | POA: Diagnosis not present

## 2014-12-07 DIAGNOSIS — N201 Calculus of ureter: Secondary | ICD-10-CM | POA: Diagnosis not present

## 2014-12-07 DIAGNOSIS — Z7982 Long term (current) use of aspirin: Secondary | ICD-10-CM | POA: Diagnosis not present

## 2014-12-07 DIAGNOSIS — I499 Cardiac arrhythmia, unspecified: Secondary | ICD-10-CM | POA: Diagnosis not present

## 2014-12-07 DIAGNOSIS — N200 Calculus of kidney: Secondary | ICD-10-CM | POA: Diagnosis not present

## 2014-12-07 DIAGNOSIS — Z886 Allergy status to analgesic agent status: Secondary | ICD-10-CM | POA: Diagnosis not present

## 2014-12-09 DIAGNOSIS — I700 Atherosclerosis of aorta: Secondary | ICD-10-CM | POA: Diagnosis not present

## 2014-12-09 DIAGNOSIS — N200 Calculus of kidney: Secondary | ICD-10-CM | POA: Diagnosis not present

## 2014-12-10 DIAGNOSIS — Z79899 Other long term (current) drug therapy: Secondary | ICD-10-CM | POA: Diagnosis not present

## 2014-12-10 DIAGNOSIS — R1032 Left lower quadrant pain: Secondary | ICD-10-CM | POA: Diagnosis not present

## 2014-12-10 DIAGNOSIS — N400 Enlarged prostate without lower urinary tract symptoms: Secondary | ICD-10-CM | POA: Diagnosis not present

## 2014-12-10 DIAGNOSIS — Z951 Presence of aortocoronary bypass graft: Secondary | ICD-10-CM | POA: Diagnosis not present

## 2014-12-10 DIAGNOSIS — I10 Essential (primary) hypertension: Secondary | ICD-10-CM | POA: Diagnosis not present

## 2014-12-10 DIAGNOSIS — I2510 Atherosclerotic heart disease of native coronary artery without angina pectoris: Secondary | ICD-10-CM | POA: Diagnosis not present

## 2014-12-10 DIAGNOSIS — Z8507 Personal history of malignant neoplasm of pancreas: Secondary | ICD-10-CM | POA: Diagnosis not present

## 2014-12-10 DIAGNOSIS — N179 Acute kidney failure, unspecified: Secondary | ICD-10-CM | POA: Diagnosis not present

## 2014-12-10 DIAGNOSIS — Z9109 Other allergy status, other than to drugs and biological substances: Secondary | ICD-10-CM | POA: Diagnosis not present

## 2014-12-10 DIAGNOSIS — R109 Unspecified abdominal pain: Secondary | ICD-10-CM | POA: Diagnosis not present

## 2014-12-10 DIAGNOSIS — N202 Calculus of kidney with calculus of ureter: Secondary | ICD-10-CM | POA: Diagnosis not present

## 2014-12-10 DIAGNOSIS — Z885 Allergy status to narcotic agent status: Secondary | ICD-10-CM | POA: Diagnosis not present

## 2014-12-10 DIAGNOSIS — I25810 Atherosclerosis of coronary artery bypass graft(s) without angina pectoris: Secondary | ICD-10-CM | POA: Diagnosis not present

## 2014-12-10 DIAGNOSIS — N23 Unspecified renal colic: Secondary | ICD-10-CM | POA: Diagnosis not present

## 2014-12-10 DIAGNOSIS — E875 Hyperkalemia: Secondary | ICD-10-CM | POA: Diagnosis not present

## 2014-12-10 DIAGNOSIS — Z7982 Long term (current) use of aspirin: Secondary | ICD-10-CM | POA: Diagnosis not present

## 2014-12-10 DIAGNOSIS — Z792 Long term (current) use of antibiotics: Secondary | ICD-10-CM | POA: Diagnosis not present

## 2014-12-10 DIAGNOSIS — E785 Hyperlipidemia, unspecified: Secondary | ICD-10-CM | POA: Diagnosis not present

## 2014-12-10 DIAGNOSIS — N201 Calculus of ureter: Secondary | ICD-10-CM | POA: Diagnosis not present

## 2014-12-11 DIAGNOSIS — E875 Hyperkalemia: Secondary | ICD-10-CM | POA: Diagnosis not present

## 2014-12-11 DIAGNOSIS — N178 Other acute kidney failure: Secondary | ICD-10-CM | POA: Diagnosis not present

## 2014-12-11 DIAGNOSIS — N29 Other disorders of kidney and ureter in diseases classified elsewhere: Secondary | ICD-10-CM | POA: Diagnosis not present

## 2014-12-11 DIAGNOSIS — N202 Calculus of kidney with calculus of ureter: Secondary | ICD-10-CM | POA: Diagnosis not present

## 2014-12-11 DIAGNOSIS — I10 Essential (primary) hypertension: Secondary | ICD-10-CM | POA: Diagnosis not present

## 2014-12-11 DIAGNOSIS — Z8507 Personal history of malignant neoplasm of pancreas: Secondary | ICD-10-CM | POA: Diagnosis not present

## 2014-12-12 DIAGNOSIS — N29 Other disorders of kidney and ureter in diseases classified elsewhere: Secondary | ICD-10-CM | POA: Diagnosis not present

## 2014-12-12 DIAGNOSIS — I10 Essential (primary) hypertension: Secondary | ICD-10-CM | POA: Diagnosis not present

## 2014-12-12 DIAGNOSIS — Z8507 Personal history of malignant neoplasm of pancreas: Secondary | ICD-10-CM | POA: Diagnosis not present

## 2014-12-12 DIAGNOSIS — N200 Calculus of kidney: Secondary | ICD-10-CM | POA: Diagnosis not present

## 2014-12-12 DIAGNOSIS — N201 Calculus of ureter: Secondary | ICD-10-CM | POA: Diagnosis not present

## 2014-12-12 DIAGNOSIS — N178 Other acute kidney failure: Secondary | ICD-10-CM | POA: Diagnosis not present

## 2014-12-12 DIAGNOSIS — N202 Calculus of kidney with calculus of ureter: Secondary | ICD-10-CM | POA: Diagnosis not present

## 2014-12-12 DIAGNOSIS — E875 Hyperkalemia: Secondary | ICD-10-CM | POA: Diagnosis not present

## 2014-12-13 DIAGNOSIS — Z8507 Personal history of malignant neoplasm of pancreas: Secondary | ICD-10-CM | POA: Diagnosis not present

## 2014-12-13 DIAGNOSIS — I10 Essential (primary) hypertension: Secondary | ICD-10-CM | POA: Diagnosis not present

## 2014-12-13 DIAGNOSIS — N178 Other acute kidney failure: Secondary | ICD-10-CM | POA: Diagnosis not present

## 2014-12-13 DIAGNOSIS — E875 Hyperkalemia: Secondary | ICD-10-CM | POA: Diagnosis not present

## 2014-12-13 DIAGNOSIS — N29 Other disorders of kidney and ureter in diseases classified elsewhere: Secondary | ICD-10-CM | POA: Diagnosis not present

## 2015-01-03 DIAGNOSIS — Z951 Presence of aortocoronary bypass graft: Secondary | ICD-10-CM | POA: Diagnosis not present

## 2015-01-03 DIAGNOSIS — R011 Cardiac murmur, unspecified: Secondary | ICD-10-CM | POA: Insufficient documentation

## 2015-01-03 DIAGNOSIS — I10 Essential (primary) hypertension: Secondary | ICD-10-CM | POA: Diagnosis not present

## 2015-01-10 DIAGNOSIS — N200 Calculus of kidney: Secondary | ICD-10-CM | POA: Diagnosis not present

## 2015-01-17 DIAGNOSIS — I10 Essential (primary) hypertension: Secondary | ICD-10-CM | POA: Diagnosis not present

## 2015-01-17 DIAGNOSIS — R011 Cardiac murmur, unspecified: Secondary | ICD-10-CM | POA: Diagnosis not present

## 2015-01-17 DIAGNOSIS — Z951 Presence of aortocoronary bypass graft: Secondary | ICD-10-CM | POA: Diagnosis not present

## 2015-01-25 DIAGNOSIS — Z87442 Personal history of urinary calculi: Secondary | ICD-10-CM | POA: Diagnosis not present

## 2015-01-25 DIAGNOSIS — Z981 Arthrodesis status: Secondary | ICD-10-CM | POA: Diagnosis not present

## 2015-01-25 DIAGNOSIS — Z79899 Other long term (current) drug therapy: Secondary | ICD-10-CM | POA: Diagnosis not present

## 2015-01-25 DIAGNOSIS — I10 Essential (primary) hypertension: Secondary | ICD-10-CM | POA: Diagnosis not present

## 2015-01-25 DIAGNOSIS — Z886 Allergy status to analgesic agent status: Secondary | ICD-10-CM | POA: Diagnosis not present

## 2015-01-25 DIAGNOSIS — Z7982 Long term (current) use of aspirin: Secondary | ICD-10-CM | POA: Diagnosis not present

## 2015-01-25 DIAGNOSIS — Z951 Presence of aortocoronary bypass graft: Secondary | ICD-10-CM | POA: Diagnosis not present

## 2015-01-25 DIAGNOSIS — Z85828 Personal history of other malignant neoplasm of skin: Secondary | ICD-10-CM | POA: Diagnosis not present

## 2015-01-25 DIAGNOSIS — Z888 Allergy status to other drugs, medicaments and biological substances status: Secondary | ICD-10-CM | POA: Diagnosis not present

## 2015-01-25 DIAGNOSIS — Z8507 Personal history of malignant neoplasm of pancreas: Secondary | ICD-10-CM | POA: Diagnosis not present

## 2015-01-25 DIAGNOSIS — N200 Calculus of kidney: Secondary | ICD-10-CM | POA: Diagnosis not present

## 2015-01-25 DIAGNOSIS — Z808 Family history of malignant neoplasm of other organs or systems: Secondary | ICD-10-CM | POA: Diagnosis not present

## 2015-01-25 DIAGNOSIS — Z8249 Family history of ischemic heart disease and other diseases of the circulatory system: Secondary | ICD-10-CM | POA: Diagnosis not present

## 2015-02-02 DIAGNOSIS — N200 Calculus of kidney: Secondary | ICD-10-CM | POA: Diagnosis not present

## 2015-02-13 DIAGNOSIS — Z0000 Encounter for general adult medical examination without abnormal findings: Secondary | ICD-10-CM | POA: Diagnosis not present

## 2015-02-13 DIAGNOSIS — M1049 Other secondary gout, multiple sites: Secondary | ICD-10-CM | POA: Diagnosis not present

## 2015-02-13 DIAGNOSIS — I10 Essential (primary) hypertension: Secondary | ICD-10-CM | POA: Diagnosis not present

## 2015-02-13 DIAGNOSIS — M65312 Trigger thumb, left thumb: Secondary | ICD-10-CM | POA: Diagnosis not present

## 2015-02-13 DIAGNOSIS — Z1389 Encounter for screening for other disorder: Secondary | ICD-10-CM | POA: Diagnosis not present

## 2015-02-17 DIAGNOSIS — Z8507 Personal history of malignant neoplasm of pancreas: Secondary | ICD-10-CM | POA: Diagnosis not present

## 2015-02-17 DIAGNOSIS — D500 Iron deficiency anemia secondary to blood loss (chronic): Secondary | ICD-10-CM | POA: Diagnosis not present

## 2015-02-17 DIAGNOSIS — D62 Acute posthemorrhagic anemia: Secondary | ICD-10-CM | POA: Diagnosis not present

## 2015-02-17 DIAGNOSIS — K9289 Other specified diseases of the digestive system: Secondary | ICD-10-CM | POA: Diagnosis not present

## 2015-02-17 DIAGNOSIS — Z0389 Encounter for observation for other suspected diseases and conditions ruled out: Secondary | ICD-10-CM | POA: Diagnosis not present

## 2015-02-17 DIAGNOSIS — I10 Essential (primary) hypertension: Secondary | ICD-10-CM | POA: Diagnosis not present

## 2015-02-18 DIAGNOSIS — D500 Iron deficiency anemia secondary to blood loss (chronic): Secondary | ICD-10-CM | POA: Diagnosis not present

## 2015-02-18 DIAGNOSIS — I10 Essential (primary) hypertension: Secondary | ICD-10-CM | POA: Diagnosis not present

## 2015-02-18 DIAGNOSIS — D62 Acute posthemorrhagic anemia: Secondary | ICD-10-CM | POA: Diagnosis not present

## 2015-02-18 DIAGNOSIS — Z8507 Personal history of malignant neoplasm of pancreas: Secondary | ICD-10-CM | POA: Diagnosis not present

## 2015-02-18 DIAGNOSIS — K9289 Other specified diseases of the digestive system: Secondary | ICD-10-CM | POA: Diagnosis not present

## 2015-02-19 DIAGNOSIS — Z79899 Other long term (current) drug therapy: Secondary | ICD-10-CM | POA: Diagnosis not present

## 2015-02-19 DIAGNOSIS — D62 Acute posthemorrhagic anemia: Secondary | ICD-10-CM | POA: Diagnosis not present

## 2015-02-19 DIAGNOSIS — I2510 Atherosclerotic heart disease of native coronary artery without angina pectoris: Secondary | ICD-10-CM | POA: Diagnosis present

## 2015-02-19 DIAGNOSIS — K9289 Other specified diseases of the digestive system: Secondary | ICD-10-CM | POA: Diagnosis not present

## 2015-02-19 DIAGNOSIS — Z951 Presence of aortocoronary bypass graft: Secondary | ICD-10-CM | POA: Diagnosis not present

## 2015-02-19 DIAGNOSIS — Z886 Allergy status to analgesic agent status: Secondary | ICD-10-CM | POA: Diagnosis not present

## 2015-02-19 DIAGNOSIS — K5730 Diverticulosis of large intestine without perforation or abscess without bleeding: Secondary | ICD-10-CM | POA: Diagnosis present

## 2015-02-19 DIAGNOSIS — Z7982 Long term (current) use of aspirin: Secondary | ICD-10-CM | POA: Diagnosis not present

## 2015-02-19 DIAGNOSIS — Z86010 Personal history of colonic polyps: Secondary | ICD-10-CM | POA: Diagnosis not present

## 2015-02-19 DIAGNOSIS — D500 Iron deficiency anemia secondary to blood loss (chronic): Secondary | ICD-10-CM | POA: Diagnosis not present

## 2015-02-19 DIAGNOSIS — E7800 Pure hypercholesterolemia, unspecified: Secondary | ICD-10-CM | POA: Diagnosis present

## 2015-02-19 DIAGNOSIS — K641 Second degree hemorrhoids: Secondary | ICD-10-CM | POA: Diagnosis present

## 2015-02-19 DIAGNOSIS — Z9109 Other allergy status, other than to drugs and biological substances: Secondary | ICD-10-CM | POA: Diagnosis not present

## 2015-02-19 DIAGNOSIS — Z8507 Personal history of malignant neoplasm of pancreas: Secondary | ICD-10-CM | POA: Diagnosis not present

## 2015-02-19 DIAGNOSIS — Z79891 Long term (current) use of opiate analgesic: Secondary | ICD-10-CM | POA: Diagnosis not present

## 2015-02-19 DIAGNOSIS — I10 Essential (primary) hypertension: Secondary | ICD-10-CM | POA: Diagnosis not present

## 2015-02-19 DIAGNOSIS — Z888 Allergy status to other drugs, medicaments and biological substances status: Secondary | ICD-10-CM | POA: Diagnosis not present

## 2015-02-19 DIAGNOSIS — E785 Hyperlipidemia, unspecified: Secondary | ICD-10-CM | POA: Diagnosis present

## 2015-02-19 DIAGNOSIS — K922 Gastrointestinal hemorrhage, unspecified: Secondary | ICD-10-CM | POA: Diagnosis not present

## 2015-02-19 DIAGNOSIS — M1990 Unspecified osteoarthritis, unspecified site: Secondary | ICD-10-CM | POA: Diagnosis present

## 2015-02-19 DIAGNOSIS — N400 Enlarged prostate without lower urinary tract symptoms: Secondary | ICD-10-CM | POA: Diagnosis present

## 2015-02-19 DIAGNOSIS — K625 Hemorrhage of anus and rectum: Secondary | ICD-10-CM | POA: Diagnosis not present

## 2015-02-19 DIAGNOSIS — Z8701 Personal history of pneumonia (recurrent): Secondary | ICD-10-CM | POA: Diagnosis not present

## 2015-03-10 DIAGNOSIS — N200 Calculus of kidney: Secondary | ICD-10-CM | POA: Diagnosis not present

## 2015-03-10 DIAGNOSIS — M47816 Spondylosis without myelopathy or radiculopathy, lumbar region: Secondary | ICD-10-CM | POA: Diagnosis not present

## 2015-04-09 DIAGNOSIS — M545 Low back pain: Secondary | ICD-10-CM | POA: Diagnosis not present

## 2015-04-09 DIAGNOSIS — I10 Essential (primary) hypertension: Secondary | ICD-10-CM | POA: Diagnosis not present

## 2015-04-09 DIAGNOSIS — D638 Anemia in other chronic diseases classified elsewhere: Secondary | ICD-10-CM | POA: Diagnosis not present

## 2015-04-09 DIAGNOSIS — I2510 Atherosclerotic heart disease of native coronary artery without angina pectoris: Secondary | ICD-10-CM | POA: Diagnosis not present

## 2015-04-09 DIAGNOSIS — J168 Pneumonia due to other specified infectious organisms: Secondary | ICD-10-CM | POA: Diagnosis not present

## 2015-04-10 DIAGNOSIS — J180 Bronchopneumonia, unspecified organism: Secondary | ICD-10-CM | POA: Diagnosis not present

## 2015-04-10 DIAGNOSIS — E785 Hyperlipidemia, unspecified: Secondary | ICD-10-CM | POA: Diagnosis not present

## 2015-04-10 DIAGNOSIS — R509 Fever, unspecified: Secondary | ICD-10-CM | POA: Diagnosis not present

## 2015-04-10 DIAGNOSIS — J168 Pneumonia due to other specified infectious organisms: Secondary | ICD-10-CM | POA: Diagnosis not present

## 2015-04-10 DIAGNOSIS — I2510 Atherosclerotic heart disease of native coronary artery without angina pectoris: Secondary | ICD-10-CM | POA: Diagnosis not present

## 2015-04-10 DIAGNOSIS — N400 Enlarged prostate without lower urinary tract symptoms: Secondary | ICD-10-CM | POA: Diagnosis not present

## 2015-04-10 DIAGNOSIS — D638 Anemia in other chronic diseases classified elsewhere: Secondary | ICD-10-CM | POA: Diagnosis not present

## 2015-04-10 DIAGNOSIS — M545 Low back pain: Secondary | ICD-10-CM | POA: Diagnosis not present

## 2015-04-10 DIAGNOSIS — I10 Essential (primary) hypertension: Secondary | ICD-10-CM | POA: Diagnosis not present

## 2015-04-11 DIAGNOSIS — D638 Anemia in other chronic diseases classified elsewhere: Secondary | ICD-10-CM | POA: Diagnosis not present

## 2015-04-11 DIAGNOSIS — M545 Low back pain: Secondary | ICD-10-CM | POA: Diagnosis not present

## 2015-04-11 DIAGNOSIS — E785 Hyperlipidemia, unspecified: Secondary | ICD-10-CM | POA: Diagnosis not present

## 2015-04-11 DIAGNOSIS — J180 Bronchopneumonia, unspecified organism: Secondary | ICD-10-CM | POA: Diagnosis not present

## 2015-04-11 DIAGNOSIS — I2510 Atherosclerotic heart disease of native coronary artery without angina pectoris: Secondary | ICD-10-CM | POA: Diagnosis not present

## 2015-04-11 DIAGNOSIS — I10 Essential (primary) hypertension: Secondary | ICD-10-CM | POA: Diagnosis not present

## 2015-04-14 DIAGNOSIS — K529 Noninfective gastroenteritis and colitis, unspecified: Secondary | ICD-10-CM | POA: Diagnosis present

## 2015-04-14 DIAGNOSIS — G8929 Other chronic pain: Secondary | ICD-10-CM | POA: Diagnosis not present

## 2015-04-14 DIAGNOSIS — D638 Anemia in other chronic diseases classified elsewhere: Secondary | ICD-10-CM | POA: Diagnosis present

## 2015-04-14 DIAGNOSIS — R509 Fever, unspecified: Secondary | ICD-10-CM | POA: Diagnosis not present

## 2015-04-14 DIAGNOSIS — E860 Dehydration: Secondary | ICD-10-CM | POA: Diagnosis not present

## 2015-04-14 DIAGNOSIS — N400 Enlarged prostate without lower urinary tract symptoms: Secondary | ICD-10-CM | POA: Diagnosis present

## 2015-04-14 DIAGNOSIS — Z836 Family history of other diseases of the respiratory system: Secondary | ICD-10-CM | POA: Diagnosis not present

## 2015-04-14 DIAGNOSIS — E785 Hyperlipidemia, unspecified: Secondary | ICD-10-CM | POA: Diagnosis present

## 2015-04-14 DIAGNOSIS — N179 Acute kidney failure, unspecified: Secondary | ICD-10-CM | POA: Diagnosis present

## 2015-04-14 DIAGNOSIS — Z8507 Personal history of malignant neoplasm of pancreas: Secondary | ICD-10-CM | POA: Diagnosis not present

## 2015-04-14 DIAGNOSIS — I10 Essential (primary) hypertension: Secondary | ICD-10-CM | POA: Diagnosis present

## 2015-04-14 DIAGNOSIS — Z885 Allergy status to narcotic agent status: Secondary | ICD-10-CM | POA: Diagnosis not present

## 2015-04-14 DIAGNOSIS — N178 Other acute kidney failure: Secondary | ICD-10-CM | POA: Diagnosis not present

## 2015-04-14 DIAGNOSIS — Z8249 Family history of ischemic heart disease and other diseases of the circulatory system: Secondary | ICD-10-CM | POA: Diagnosis not present

## 2015-04-14 DIAGNOSIS — N200 Calculus of kidney: Secondary | ICD-10-CM | POA: Diagnosis not present

## 2015-04-14 DIAGNOSIS — Z800 Family history of malignant neoplasm of digestive organs: Secondary | ICD-10-CM | POA: Diagnosis not present

## 2015-04-14 DIAGNOSIS — Z79899 Other long term (current) drug therapy: Secondary | ICD-10-CM | POA: Diagnosis not present

## 2015-04-14 DIAGNOSIS — I2510 Atherosclerotic heart disease of native coronary artery without angina pectoris: Secondary | ICD-10-CM | POA: Diagnosis not present

## 2015-04-14 DIAGNOSIS — M545 Low back pain: Secondary | ICD-10-CM | POA: Diagnosis not present

## 2015-04-14 DIAGNOSIS — Z888 Allergy status to other drugs, medicaments and biological substances status: Secondary | ICD-10-CM | POA: Diagnosis not present

## 2015-04-25 DIAGNOSIS — I10 Essential (primary) hypertension: Secondary | ICD-10-CM | POA: Diagnosis not present

## 2015-04-25 DIAGNOSIS — J17 Pneumonia in diseases classified elsewhere: Secondary | ICD-10-CM | POA: Diagnosis not present

## 2015-05-02 DIAGNOSIS — L309 Dermatitis, unspecified: Secondary | ICD-10-CM | POA: Diagnosis not present

## 2015-05-02 DIAGNOSIS — L570 Actinic keratosis: Secondary | ICD-10-CM | POA: Diagnosis not present

## 2015-05-02 DIAGNOSIS — Z85828 Personal history of other malignant neoplasm of skin: Secondary | ICD-10-CM | POA: Diagnosis not present

## 2015-05-16 DIAGNOSIS — I10 Essential (primary) hypertension: Secondary | ICD-10-CM | POA: Diagnosis not present

## 2015-05-16 DIAGNOSIS — R5383 Other fatigue: Secondary | ICD-10-CM | POA: Diagnosis not present

## 2015-05-26 DIAGNOSIS — G4730 Sleep apnea, unspecified: Secondary | ICD-10-CM | POA: Diagnosis not present

## 2015-05-30 DIAGNOSIS — G4730 Sleep apnea, unspecified: Secondary | ICD-10-CM | POA: Diagnosis not present

## 2015-05-31 DIAGNOSIS — G4730 Sleep apnea, unspecified: Secondary | ICD-10-CM | POA: Diagnosis not present

## 2015-06-28 DIAGNOSIS — N200 Calculus of kidney: Secondary | ICD-10-CM | POA: Diagnosis not present

## 2015-06-29 DIAGNOSIS — R8299 Other abnormal findings in urine: Secondary | ICD-10-CM | POA: Diagnosis not present

## 2015-06-29 DIAGNOSIS — N200 Calculus of kidney: Secondary | ICD-10-CM | POA: Diagnosis not present

## 2015-07-06 DIAGNOSIS — R7989 Other specified abnormal findings of blood chemistry: Secondary | ICD-10-CM | POA: Diagnosis not present

## 2015-07-06 DIAGNOSIS — R7309 Other abnormal glucose: Secondary | ICD-10-CM | POA: Diagnosis not present

## 2015-07-06 DIAGNOSIS — N400 Enlarged prostate without lower urinary tract symptoms: Secondary | ICD-10-CM | POA: Diagnosis not present

## 2015-07-06 DIAGNOSIS — C250 Malignant neoplasm of head of pancreas: Secondary | ICD-10-CM | POA: Diagnosis not present

## 2015-07-06 DIAGNOSIS — I2510 Atherosclerotic heart disease of native coronary artery without angina pectoris: Secondary | ICD-10-CM | POA: Diagnosis not present

## 2015-07-06 DIAGNOSIS — D490 Neoplasm of unspecified behavior of digestive system: Secondary | ICD-10-CM | POA: Diagnosis not present

## 2015-07-06 DIAGNOSIS — R799 Abnormal finding of blood chemistry, unspecified: Secondary | ICD-10-CM | POA: Diagnosis not present

## 2015-07-06 DIAGNOSIS — I10 Essential (primary) hypertension: Secondary | ICD-10-CM | POA: Diagnosis not present

## 2015-07-31 DIAGNOSIS — Z8507 Personal history of malignant neoplasm of pancreas: Secondary | ICD-10-CM | POA: Diagnosis not present

## 2015-07-31 DIAGNOSIS — Q401 Congenital hiatus hernia: Secondary | ICD-10-CM | POA: Diagnosis not present

## 2015-07-31 DIAGNOSIS — F458 Other somatoform disorders: Secondary | ICD-10-CM | POA: Diagnosis not present

## 2015-07-31 DIAGNOSIS — K219 Gastro-esophageal reflux disease without esophagitis: Secondary | ICD-10-CM | POA: Diagnosis not present

## 2015-08-21 DIAGNOSIS — I10 Essential (primary) hypertension: Secondary | ICD-10-CM | POA: Diagnosis not present

## 2015-08-21 DIAGNOSIS — M1049 Other secondary gout, multiple sites: Secondary | ICD-10-CM | POA: Diagnosis not present

## 2015-08-21 DIAGNOSIS — M791 Myalgia: Secondary | ICD-10-CM | POA: Diagnosis not present

## 2015-08-21 DIAGNOSIS — R5383 Other fatigue: Secondary | ICD-10-CM | POA: Diagnosis not present

## 2015-09-01 DIAGNOSIS — I10 Essential (primary) hypertension: Secondary | ICD-10-CM | POA: Diagnosis not present

## 2015-09-01 DIAGNOSIS — M791 Myalgia: Secondary | ICD-10-CM | POA: Diagnosis not present

## 2015-09-01 DIAGNOSIS — M1049 Other secondary gout, multiple sites: Secondary | ICD-10-CM | POA: Diagnosis not present

## 2015-09-06 DIAGNOSIS — I10 Essential (primary) hypertension: Secondary | ICD-10-CM | POA: Diagnosis not present

## 2015-09-22 DIAGNOSIS — Z951 Presence of aortocoronary bypass graft: Secondary | ICD-10-CM | POA: Diagnosis not present

## 2015-09-22 DIAGNOSIS — R112 Nausea with vomiting, unspecified: Secondary | ICD-10-CM | POA: Diagnosis not present

## 2015-09-22 DIAGNOSIS — E860 Dehydration: Secondary | ICD-10-CM | POA: Diagnosis not present

## 2015-09-22 DIAGNOSIS — I2510 Atherosclerotic heart disease of native coronary artery without angina pectoris: Secondary | ICD-10-CM | POA: Diagnosis not present

## 2015-09-22 DIAGNOSIS — T887XXA Unspecified adverse effect of drug or medicament, initial encounter: Secondary | ICD-10-CM | POA: Diagnosis not present

## 2015-09-22 DIAGNOSIS — R1111 Vomiting without nausea: Secondary | ICD-10-CM | POA: Diagnosis not present

## 2015-09-22 DIAGNOSIS — Z8507 Personal history of malignant neoplasm of pancreas: Secondary | ICD-10-CM | POA: Diagnosis not present

## 2015-09-22 DIAGNOSIS — T402X5A Adverse effect of other opioids, initial encounter: Secondary | ICD-10-CM | POA: Diagnosis not present

## 2015-09-22 DIAGNOSIS — E7800 Pure hypercholesterolemia, unspecified: Secondary | ICD-10-CM | POA: Diagnosis not present

## 2015-09-22 DIAGNOSIS — M1990 Unspecified osteoarthritis, unspecified site: Secondary | ICD-10-CM | POA: Diagnosis not present

## 2015-09-22 DIAGNOSIS — Z87442 Personal history of urinary calculi: Secondary | ICD-10-CM | POA: Diagnosis not present

## 2015-09-22 DIAGNOSIS — K2970 Gastritis, unspecified, without bleeding: Secondary | ICD-10-CM | POA: Diagnosis not present

## 2015-09-22 DIAGNOSIS — Z79899 Other long term (current) drug therapy: Secondary | ICD-10-CM | POA: Diagnosis not present

## 2015-09-28 DIAGNOSIS — I10 Essential (primary) hypertension: Secondary | ICD-10-CM | POA: Diagnosis not present

## 2015-09-28 DIAGNOSIS — M791 Myalgia: Secondary | ICD-10-CM | POA: Diagnosis not present

## 2015-09-28 DIAGNOSIS — M1049 Other secondary gout, multiple sites: Secondary | ICD-10-CM | POA: Diagnosis not present

## 2015-10-23 DIAGNOSIS — Z23 Encounter for immunization: Secondary | ICD-10-CM | POA: Diagnosis not present

## 2015-11-20 DIAGNOSIS — K861 Other chronic pancreatitis: Secondary | ICD-10-CM | POA: Diagnosis not present

## 2015-11-20 DIAGNOSIS — M1049 Other secondary gout, multiple sites: Secondary | ICD-10-CM | POA: Diagnosis not present

## 2015-11-20 DIAGNOSIS — G4709 Other insomnia: Secondary | ICD-10-CM | POA: Diagnosis not present

## 2015-11-20 DIAGNOSIS — N401 Enlarged prostate with lower urinary tract symptoms: Secondary | ICD-10-CM | POA: Diagnosis not present

## 2015-11-20 DIAGNOSIS — I10 Essential (primary) hypertension: Secondary | ICD-10-CM | POA: Diagnosis not present

## 2015-11-20 DIAGNOSIS — M791 Myalgia: Secondary | ICD-10-CM | POA: Diagnosis not present

## 2016-01-11 DIAGNOSIS — Z886 Allergy status to analgesic agent status: Secondary | ICD-10-CM | POA: Diagnosis not present

## 2016-01-11 DIAGNOSIS — R0602 Shortness of breath: Secondary | ICD-10-CM | POA: Diagnosis not present

## 2016-01-11 DIAGNOSIS — I2510 Atherosclerotic heart disease of native coronary artery without angina pectoris: Secondary | ICD-10-CM | POA: Diagnosis not present

## 2016-01-11 DIAGNOSIS — M545 Low back pain: Secondary | ICD-10-CM | POA: Diagnosis not present

## 2016-01-11 DIAGNOSIS — Z8249 Family history of ischemic heart disease and other diseases of the circulatory system: Secondary | ICD-10-CM | POA: Diagnosis not present

## 2016-01-11 DIAGNOSIS — N400 Enlarged prostate without lower urinary tract symptoms: Secondary | ICD-10-CM | POA: Diagnosis not present

## 2016-01-11 DIAGNOSIS — I10 Essential (primary) hypertension: Secondary | ICD-10-CM | POA: Diagnosis not present

## 2016-01-11 DIAGNOSIS — Z8507 Personal history of malignant neoplasm of pancreas: Secondary | ICD-10-CM | POA: Diagnosis not present

## 2016-01-11 DIAGNOSIS — G8929 Other chronic pain: Secondary | ICD-10-CM | POA: Diagnosis not present

## 2016-01-11 DIAGNOSIS — E860 Dehydration: Secondary | ICD-10-CM | POA: Diagnosis not present

## 2016-01-11 DIAGNOSIS — Z87442 Personal history of urinary calculi: Secondary | ICD-10-CM | POA: Diagnosis not present

## 2016-01-11 DIAGNOSIS — M109 Gout, unspecified: Secondary | ICD-10-CM | POA: Diagnosis not present

## 2016-01-11 DIAGNOSIS — R112 Nausea with vomiting, unspecified: Secondary | ICD-10-CM | POA: Diagnosis not present

## 2016-01-11 DIAGNOSIS — Z808 Family history of malignant neoplasm of other organs or systems: Secondary | ICD-10-CM | POA: Diagnosis not present

## 2016-01-11 DIAGNOSIS — Z8489 Family history of other specified conditions: Secondary | ICD-10-CM | POA: Diagnosis not present

## 2016-01-11 DIAGNOSIS — D638 Anemia in other chronic diseases classified elsewhere: Secondary | ICD-10-CM | POA: Diagnosis not present

## 2016-01-11 DIAGNOSIS — J111 Influenza due to unidentified influenza virus with other respiratory manifestations: Secondary | ICD-10-CM | POA: Diagnosis not present

## 2016-01-11 DIAGNOSIS — Z79899 Other long term (current) drug therapy: Secondary | ICD-10-CM | POA: Diagnosis not present

## 2016-01-11 DIAGNOSIS — Z888 Allergy status to other drugs, medicaments and biological substances status: Secondary | ICD-10-CM | POA: Diagnosis not present

## 2016-01-11 DIAGNOSIS — R079 Chest pain, unspecified: Secondary | ICD-10-CM | POA: Diagnosis not present

## 2016-01-11 DIAGNOSIS — E785 Hyperlipidemia, unspecified: Secondary | ICD-10-CM | POA: Diagnosis not present

## 2016-01-12 DIAGNOSIS — J111 Influenza due to unidentified influenza virus with other respiratory manifestations: Secondary | ICD-10-CM | POA: Diagnosis not present

## 2016-01-12 DIAGNOSIS — Z8507 Personal history of malignant neoplasm of pancreas: Secondary | ICD-10-CM | POA: Diagnosis not present

## 2016-01-12 DIAGNOSIS — I10 Essential (primary) hypertension: Secondary | ICD-10-CM | POA: Diagnosis not present

## 2016-01-12 DIAGNOSIS — E860 Dehydration: Secondary | ICD-10-CM | POA: Diagnosis not present

## 2016-01-12 DIAGNOSIS — D638 Anemia in other chronic diseases classified elsewhere: Secondary | ICD-10-CM | POA: Diagnosis not present

## 2016-01-12 DIAGNOSIS — I2510 Atherosclerotic heart disease of native coronary artery without angina pectoris: Secondary | ICD-10-CM | POA: Diagnosis not present

## 2016-01-13 DIAGNOSIS — I10 Essential (primary) hypertension: Secondary | ICD-10-CM | POA: Diagnosis not present

## 2016-01-13 DIAGNOSIS — I2510 Atherosclerotic heart disease of native coronary artery without angina pectoris: Secondary | ICD-10-CM | POA: Diagnosis not present

## 2016-01-13 DIAGNOSIS — Z8507 Personal history of malignant neoplasm of pancreas: Secondary | ICD-10-CM | POA: Diagnosis not present

## 2016-01-13 DIAGNOSIS — E860 Dehydration: Secondary | ICD-10-CM | POA: Diagnosis not present

## 2016-01-13 DIAGNOSIS — D638 Anemia in other chronic diseases classified elsewhere: Secondary | ICD-10-CM | POA: Diagnosis not present

## 2016-01-13 DIAGNOSIS — J111 Influenza due to unidentified influenza virus with other respiratory manifestations: Secondary | ICD-10-CM | POA: Diagnosis not present

## 2016-01-22 DIAGNOSIS — J111 Influenza due to unidentified influenza virus with other respiratory manifestations: Secondary | ICD-10-CM | POA: Diagnosis not present

## 2016-01-22 DIAGNOSIS — E860 Dehydration: Secondary | ICD-10-CM | POA: Diagnosis not present

## 2016-02-03 DIAGNOSIS — B961 Klebsiella pneumoniae [K. pneumoniae] as the cause of diseases classified elsewhere: Secondary | ICD-10-CM | POA: Diagnosis not present

## 2016-02-03 DIAGNOSIS — R0789 Other chest pain: Secondary | ICD-10-CM | POA: Diagnosis not present

## 2016-02-03 DIAGNOSIS — R509 Fever, unspecified: Secondary | ICD-10-CM | POA: Diagnosis not present

## 2016-02-03 DIAGNOSIS — B379 Candidiasis, unspecified: Secondary | ICD-10-CM | POA: Diagnosis not present

## 2016-02-03 DIAGNOSIS — T814XXA Infection following a procedure, initial encounter: Secondary | ICD-10-CM | POA: Diagnosis not present

## 2016-02-03 DIAGNOSIS — Z8507 Personal history of malignant neoplasm of pancreas: Secondary | ICD-10-CM | POA: Diagnosis not present

## 2016-02-03 DIAGNOSIS — R7881 Bacteremia: Secondary | ICD-10-CM | POA: Diagnosis not present

## 2016-02-03 DIAGNOSIS — R079 Chest pain, unspecified: Secondary | ICD-10-CM | POA: Diagnosis not present

## 2016-02-03 DIAGNOSIS — D638 Anemia in other chronic diseases classified elsewhere: Secondary | ICD-10-CM | POA: Diagnosis not present

## 2016-02-04 DIAGNOSIS — B379 Candidiasis, unspecified: Secondary | ICD-10-CM | POA: Diagnosis present

## 2016-02-04 DIAGNOSIS — B961 Klebsiella pneumoniae [K. pneumoniae] as the cause of diseases classified elsewhere: Secondary | ICD-10-CM | POA: Diagnosis not present

## 2016-02-04 DIAGNOSIS — R7881 Bacteremia: Secondary | ICD-10-CM | POA: Diagnosis not present

## 2016-02-04 DIAGNOSIS — I10 Essential (primary) hypertension: Secondary | ICD-10-CM | POA: Diagnosis present

## 2016-02-04 DIAGNOSIS — Z9109 Other allergy status, other than to drugs and biological substances: Secondary | ICD-10-CM | POA: Diagnosis not present

## 2016-02-04 DIAGNOSIS — Z885 Allergy status to narcotic agent status: Secondary | ICD-10-CM | POA: Diagnosis not present

## 2016-02-04 DIAGNOSIS — Z8507 Personal history of malignant neoplasm of pancreas: Secondary | ICD-10-CM | POA: Diagnosis not present

## 2016-02-04 DIAGNOSIS — E785 Hyperlipidemia, unspecified: Secondary | ICD-10-CM | POA: Diagnosis present

## 2016-02-04 DIAGNOSIS — Z79899 Other long term (current) drug therapy: Secondary | ICD-10-CM | POA: Diagnosis not present

## 2016-02-04 DIAGNOSIS — D638 Anemia in other chronic diseases classified elsewhere: Secondary | ICD-10-CM | POA: Diagnosis not present

## 2016-02-04 DIAGNOSIS — Y838 Other surgical procedures as the cause of abnormal reaction of the patient, or of later complication, without mention of misadventure at the time of the procedure: Secondary | ICD-10-CM | POA: Diagnosis not present

## 2016-02-04 DIAGNOSIS — N400 Enlarged prostate without lower urinary tract symptoms: Secondary | ICD-10-CM | POA: Diagnosis present

## 2016-02-04 DIAGNOSIS — T814XXA Infection following a procedure, initial encounter: Secondary | ICD-10-CM | POA: Diagnosis not present

## 2016-02-04 DIAGNOSIS — K5730 Diverticulosis of large intestine without perforation or abscess without bleeding: Secondary | ICD-10-CM | POA: Diagnosis not present

## 2016-02-16 DIAGNOSIS — M1049 Other secondary gout, multiple sites: Secondary | ICD-10-CM | POA: Diagnosis not present

## 2016-02-16 DIAGNOSIS — B961 Klebsiella pneumoniae [K. pneumoniae] as the cause of diseases classified elsewhere: Secondary | ICD-10-CM | POA: Diagnosis not present

## 2016-02-16 DIAGNOSIS — Z1389 Encounter for screening for other disorder: Secondary | ICD-10-CM | POA: Diagnosis not present

## 2016-02-16 DIAGNOSIS — I10 Essential (primary) hypertension: Secondary | ICD-10-CM | POA: Diagnosis not present

## 2016-02-16 DIAGNOSIS — M791 Myalgia: Secondary | ICD-10-CM | POA: Diagnosis not present

## 2016-02-16 DIAGNOSIS — Z0000 Encounter for general adult medical examination without abnormal findings: Secondary | ICD-10-CM | POA: Diagnosis not present

## 2016-02-29 DIAGNOSIS — Z8619 Personal history of other infectious and parasitic diseases: Secondary | ICD-10-CM | POA: Diagnosis not present

## 2016-02-29 DIAGNOSIS — Z08 Encounter for follow-up examination after completed treatment for malignant neoplasm: Secondary | ICD-10-CM | POA: Diagnosis not present

## 2016-02-29 DIAGNOSIS — I2510 Atherosclerotic heart disease of native coronary artery without angina pectoris: Secondary | ICD-10-CM | POA: Diagnosis not present

## 2016-02-29 DIAGNOSIS — I10 Essential (primary) hypertension: Secondary | ICD-10-CM | POA: Diagnosis not present

## 2016-02-29 DIAGNOSIS — N200 Calculus of kidney: Secondary | ICD-10-CM | POA: Diagnosis not present

## 2016-02-29 DIAGNOSIS — Z8507 Personal history of malignant neoplasm of pancreas: Secondary | ICD-10-CM | POA: Diagnosis not present

## 2016-02-29 DIAGNOSIS — Z90411 Acquired partial absence of pancreas: Secondary | ICD-10-CM | POA: Diagnosis not present

## 2016-03-04 DIAGNOSIS — N200 Calculus of kidney: Secondary | ICD-10-CM | POA: Diagnosis not present

## 2016-04-05 ENCOUNTER — Encounter: Payer: Self-pay | Admitting: *Deleted

## 2016-04-08 ENCOUNTER — Encounter: Payer: Self-pay | Admitting: *Deleted

## 2016-04-08 ENCOUNTER — Encounter: Payer: Self-pay | Admitting: Cardiovascular Disease

## 2016-04-08 ENCOUNTER — Ambulatory Visit (INDEPENDENT_AMBULATORY_CARE_PROVIDER_SITE_OTHER): Payer: Medicare Other | Admitting: Cardiovascular Disease

## 2016-04-08 VITALS — BP 159/86 | HR 82 | Ht 68.0 in | Wt 154.2 lb

## 2016-04-08 DIAGNOSIS — I25708 Atherosclerosis of coronary artery bypass graft(s), unspecified, with other forms of angina pectoris: Secondary | ICD-10-CM | POA: Diagnosis not present

## 2016-04-08 DIAGNOSIS — L570 Actinic keratosis: Secondary | ICD-10-CM | POA: Diagnosis not present

## 2016-04-08 DIAGNOSIS — Z85828 Personal history of other malignant neoplasm of skin: Secondary | ICD-10-CM | POA: Diagnosis not present

## 2016-04-08 DIAGNOSIS — I10 Essential (primary) hypertension: Secondary | ICD-10-CM

## 2016-04-08 DIAGNOSIS — E785 Hyperlipidemia, unspecified: Secondary | ICD-10-CM | POA: Diagnosis not present

## 2016-04-08 DIAGNOSIS — I350 Nonrheumatic aortic (valve) stenosis: Secondary | ICD-10-CM

## 2016-04-08 DIAGNOSIS — D485 Neoplasm of uncertain behavior of skin: Secondary | ICD-10-CM | POA: Diagnosis not present

## 2016-04-08 DIAGNOSIS — I371 Nonrheumatic pulmonary valve insufficiency: Secondary | ICD-10-CM

## 2016-04-08 MED ORDER — ATORVASTATIN CALCIUM 20 MG PO TABS: 20.0000 mg | ORAL_TABLET | Freq: Every day | ORAL | 11 refills | Status: AC

## 2016-04-08 MED ORDER — NITROGLYCERIN 0.4 MG SL SUBL: 0.4000 mg | SUBLINGUAL_TABLET | SUBLINGUAL | 3 refills | Status: AC | PRN

## 2016-04-08 MED ORDER — METOPROLOL TARTRATE 25 MG PO TABS: 25.0000 mg | ORAL_TABLET | Freq: Two times a day (BID) | ORAL | 11 refills | Status: AC

## 2016-04-08 NOTE — Patient Instructions (Addendum)
Medication Instructions:   Begin Nitroglycerin as needed for severe chest pain only.  Increase Metoprolol tart to twice a day.  Begin Lipitor 20mg  daily.  Continue all other medications.    Labwork:  LFT (liver function) - due in 1 month - order given today.   Office will contact with results via phone or letter.    Testing/Procedures: none  Follow-Up: Your physician wants you to follow up in:  1 year.  You will receive a reminder letter in the mail one-two months in advance.  If you don't receive a letter, please call our office to schedule the follow up appointment   Any Other Special Instructions Will Be Listed Below (If Applicable).  If you need a refill on your cardiac medications before your next appointment, please call your pharmacy.

## 2016-04-08 NOTE — Progress Notes (Signed)
     CARDIOLOGY CONSULT NOTE  Patient ID: Brett Mercado MRN: [id number] DOB/AGE: 73/17/1947 70 y.o.  Admit date: (Not on file) Primary Physician: XAJE A HASANAJ, MD Referring Physician:   Reason for Consultation: CAD  HPI: The patient is a 70-year-old male with a history of coronary artery disease who was most recently evaluated by Dr. Katz in March 2013. He reportedly has a history of CABG in 1995. He underwent coronary angiography in 2006 which demonstrated patent bypass grafts. He underwent a nuclear stress test in June 2010 which showed possible mild inferobasal ischemia but was deemed low risk, LVEF 52%.  He also has a history of pancreatic cancer and Whipple procedure.  He was hospitalized at [facility] in December 2017 and underwent echocardiography which reportedly demonstrated moderate aortic stenosis with moderate to severe pulmonary regurgitation. He was hospitalized for intestinal bacterial overgrowth and was treated with antibiotics.  I reviewed the echocardiogram report from [facility] dated 02/06/16. Peak velocity through the aortic valve was 2.73 m/s with a mean gradient of 16 mmHg. Valve area was 1.15 cm. LVEF 50-55% with mild mitral regurgitation reported. Mild ascending aortic dilatation was reported with an aortic root measurement of 3.8 cm.  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 He works 6 days a week without limitations.   He specifically denies chest pain, shortness of breath, palpitations, lightheadedness, orthopnea, and leg swelling.  He has had a chest cold and has been taking Mucinex. He takes metoprolol tartrate 12.5 mg once daily.  Allergies  Allergen Reactions  . Codeine Itching and Swelling  . Meperidine Hcl Itching and Swelling  . Reglan [Metoclopramide] Itching and Swelling  . Other    VISTAID ANESTHESIA  . Prochlorperazine Edisylate Other (See Comments)    Unsure of reaction    Current Outpatient Prescriptions  Medication Sig Dispense Refill  . allopurinol (ZYLOPRIM) 300 MG tablet Take 1 tablet by mouth daily.    . amLODipine (NORVASC) 5 MG tablet Take 1 tablet by mouth daily.    . aspirin 81 MG tablet Take 81 mg by mouth daily.    . Cholecalciferol (VITAMIN D3) 400 UNITS CAPS Take 1 capsule by mouth daily.      . Cyanocobalamin (B-12) 1000 MCG/ML KIT Inject 1 mL as directed every 30 (thirty) days.    . gabapentin (NEURONTIN) 600 MG tablet Take 600 mg by mouth 4 (four) times daily.     . metoprolol tartrate (LOPRESSOR) 25 MG tablet Take 25 mg by mouth every evening.     . ondansetron (ZOFRAN) 8 MG tablet Take 8 mg by mouth every 8 (eight) hours as needed. For nausea.     . oxyCODONE (OXY IR/ROXICODONE) 5 MG immediate release tablet Take 5 mg by mouth every 4 (four) hours as needed. For pain.     . sulfamethoxazole-trimethoprim (BACTRIM DS,SEPTRA DS) 800-160 MG tablet Take 1 tablet by mouth daily.    . Tamsulosin HCl (FLOMAX) 0.4 MG CAPS Take 1 tablet by mouth Daily.    . testosterone cypionate (DEPOTESTOSTERONE CYPIONATE) 200 MG/ML injection Inject 1 mL into the muscle every 30 (thirty) days.    . ZENPEP 20000 units CPEP Take 1 tablet by mouth 4 (four) times daily -  before meals and at bedtime.     No current facility-administered medications for this visit.     Past Medical   History:  Diagnosis Date  . Arthritis   . CAD (coronary artery disease)    Catheterization 2006, patent grafts, possible ischemia distal circumflex not amenable to PCI  /   nuclear, June, 2010, possible mild inferobasal ischemia, low risk, EF 52%  . Carotid artery disease (HCC)    Doppler, June, 2010, less than 50% bilateral  . Cervical disc disease   . Chest pain    Chest pain when lying down in bed  over the past several nights, March, 2013  . Dyslipidemia   . Ejection fraction    52%,  nuclear, 2010  . Hx of CABG    1995..[facility]  . Hypertension   . Hypertension    x 15 yrs.  . Neuropathy (HCC)   . Palpitations   . Pancreatic cancer (HCC)    Surgery December, 2012, complete removal of the cancer, one positive node, chemotherapy recommended, patient felt poorly with poor quality of life and stopped chemotherapy  . Preop cardiovascular exam    Cardiac clearance for pancreatic surgery November, 2012    Past Surgical History:  Procedure Laterality Date  . BACK SURGERY    . BACK SURGERY     x 2. One 8 yrs ago and one in 1980  . CERVICAL DISC SURGERY     x 2 in 1997 and 2010  . CORONARY ARTERY BYPASS GRAFT  1995   ([facility])  . CORONARY ARTERY BYPASS GRAFT     with 4 vessel bypass in 1995    Social History   Social History  . Marital status: Married    Spouse name: N/A  . Number of children: N/A  . Years of education: N/A   Occupational History  . RETIRED    Social History Main Topics  . Smoking status: Never Smoker  . Smokeless tobacco: Never Used     Comment: tobacco use - no  . Alcohol use No  . Drug use: No  . Sexual activity: Not on file   Other Topics Concern  . Not on file   Social History Narrative   Married.      No family history of premature CAD in 1st degree relatives.  Prior to Admission medications   Medication Sig Start Date End Date Taking? Authorizing Provider  allopurinol (ZYLOPRIM) 300 MG tablet Take 1 tablet by mouth daily.   Yes Historical Provider, MD  amLODipine (NORVASC) 5 MG tablet Take 1 tablet by mouth daily. 02/22/16  Yes Historical Provider, MD  aspirin 81 MG tablet Take 81 mg by mouth daily.   Yes Historical Provider, MD  Cholecalciferol (VITAMIN D3) 400 UNITS CAPS Take 1 capsule by mouth daily.     Yes Historical Provider, MD  Cyanocobalamin (B-12) 1000 MCG/ML KIT Inject 1 mL as directed every 30 (thirty) days.   Yes Historical Provider, MD  gabapentin (NEURONTIN) 600 MG tablet Take 600 mg by mouth 4 (four) times  daily.    Yes Historical Provider, MD  metoprolol tartrate (LOPRESSOR) 25 MG tablet Take 25 mg by mouth every evening.    Yes Historical Provider, MD  ondansetron (ZOFRAN) 8 MG tablet Take 8 mg by mouth every 8 (eight) hours as needed. For nausea.    Yes Historical Provider, MD  oxyCODONE (OXY IR/ROXICODONE) 5 MG immediate release tablet Take 5 mg by mouth every 4 (four) hours as needed. For pain.    Yes Historical Provider, MD  sulfamethoxazole-trimethoprim (BACTRIM DS,SEPTRA DS) 800-160 MG tablet Take 1 tablet by mouth daily. 03/25/16    Yes Historical Provider, MD  Tamsulosin HCl (FLOMAX) 0.4 MG CAPS Take 1 tablet by mouth Daily. 11/05/10  Yes Historical Provider, MD  testosterone cypionate (DEPOTESTOSTERONE CYPIONATE) 200 MG/ML injection Inject 1 mL into the muscle every 30 (thirty) days. 02/27/16  Yes Historical Provider, MD  ZENPEP 20000 units CPEP Take 1 tablet by mouth 4 (four) times daily -  before meals and at bedtime. 02/02/16  Yes Historical Provider, MD     Review of systems complete and found to be negative unless listed above in HPI     Physical exam Blood pressure (!) 159/86, pulse 82, height 5' 8" (1.727 m), weight 154 lb 3.2 oz (69.9 kg), SpO2 95 %. General: NAD Neck: No JVD, no thyromegaly or thyroid nodule.  Lungs: Clear to auscultation bilaterally with normal respiratory effort. CV: Nondisplaced PMI. Regular rate and rhythm, normal S1/S2, no S3/S4, 2/6 systolic murmur over RUSB.  No peripheral edema.  No carotid bruit.  Abdomen: Soft, nontender, no distention.  Skin: Intact without lesions or rashes.  Neurologic: Alert and oriented x 3.  Psych: Normal affect. Extremities: No clubbing or cyanosis.  HEENT: Normal.   ECG: Most recent ECG reviewed.  Labs:   Lab Results  Component Value Date   WBC 6.7 07/06/2009   HGB 11.4 (L) 07/06/2009   HCT 33.8 (L) 07/06/2009   MCV 77.2 (L) 07/06/2009   PLT 170 07/06/2009   No results for input(s): NA, K, CL, CO2, BUN, CREATININE,  CALCIUM, PROT, BILITOT, ALKPHOS, ALT, AST, GLUCOSE in the last 168 hours.  Invalid input(s): LABALBU No results found for: CKTOTAL, CKMB, CKMBINDEX, TROPONINI No results found for: CHOL No results found for: HDL No results found for: LDLCALC No results found for: TRIG No results found for: CHOLHDL No results found for: LDLDIRECT       Studies: No results found.  ASSESSMENT AND PLAN:  1. CAD with h/o CABG: Currently on aspirin 81 mg and metoprolol titrate 12.5 mg daily. I will increase this to 12.5 mg twice daily. Not on statin therapy with no apparent contraindications. I will start Lipitor 20 mg daily initially. I will obtain a copy of lipids from PCP. I will prescribe nitroglycerin to be used as needed.  2. Aortic stenosis: Reported as moderate but peak velocity of 2.73 m/s and mean gradient of 16 mmHg with a valve area 1.15 cm would equate to mild to moderate aortic stenosis. Left ventricular systolic function was normal. I will continue to monitor.  3. Hypertension: Elevated today. Will monitor. Taking Norvasc 5 mg daily. I am increasing metoprolol tartrate to 12.5 mg twice daily.  4. Pulmonary regurgitation: Reported as moderate to severe. Right ventricular size and systolic function are reportedly normal. I will monitor.  Dispo: fu 1 year   Signed:  , M.D., F.A.C.C.  04/08/2016, 8:41 AM 

## 2016-04-09 DIAGNOSIS — M1049 Other secondary gout, multiple sites: Secondary | ICD-10-CM | POA: Diagnosis not present

## 2016-04-09 DIAGNOSIS — K861 Other chronic pancreatitis: Secondary | ICD-10-CM | POA: Diagnosis not present

## 2016-04-09 DIAGNOSIS — N401 Enlarged prostate with lower urinary tract symptoms: Secondary | ICD-10-CM | POA: Diagnosis not present

## 2016-04-09 DIAGNOSIS — M791 Myalgia: Secondary | ICD-10-CM | POA: Diagnosis not present

## 2016-04-09 DIAGNOSIS — I25118 Atherosclerotic heart disease of native coronary artery with other forms of angina pectoris: Secondary | ICD-10-CM | POA: Diagnosis not present

## 2016-04-09 DIAGNOSIS — J208 Acute bronchitis due to other specified organisms: Secondary | ICD-10-CM | POA: Diagnosis not present

## 2016-04-09 DIAGNOSIS — I10 Essential (primary) hypertension: Secondary | ICD-10-CM | POA: Diagnosis not present

## 2016-04-11 DIAGNOSIS — K7689 Other specified diseases of liver: Secondary | ICD-10-CM | POA: Diagnosis not present

## 2016-04-11 DIAGNOSIS — I2510 Atherosclerotic heart disease of native coronary artery without angina pectoris: Secondary | ICD-10-CM | POA: Diagnosis not present

## 2016-04-11 DIAGNOSIS — Z8507 Personal history of malignant neoplasm of pancreas: Secondary | ICD-10-CM | POA: Diagnosis not present

## 2016-04-11 DIAGNOSIS — I10 Essential (primary) hypertension: Secondary | ICD-10-CM | POA: Diagnosis not present

## 2016-04-11 DIAGNOSIS — C259 Malignant neoplasm of pancreas, unspecified: Secondary | ICD-10-CM | POA: Diagnosis not present

## 2016-04-11 DIAGNOSIS — Z08 Encounter for follow-up examination after completed treatment for malignant neoplasm: Secondary | ICD-10-CM | POA: Diagnosis not present

## 2016-04-11 DIAGNOSIS — R188 Other ascites: Secondary | ICD-10-CM | POA: Diagnosis not present

## 2016-04-13 DIAGNOSIS — R05 Cough: Secondary | ICD-10-CM | POA: Diagnosis not present

## 2016-04-13 DIAGNOSIS — I2510 Atherosclerotic heart disease of native coronary artery without angina pectoris: Secondary | ICD-10-CM | POA: Diagnosis not present

## 2016-04-13 DIAGNOSIS — E860 Dehydration: Secondary | ICD-10-CM | POA: Diagnosis not present

## 2016-04-13 DIAGNOSIS — Z951 Presence of aortocoronary bypass graft: Secondary | ICD-10-CM | POA: Diagnosis not present

## 2016-04-13 DIAGNOSIS — I10 Essential (primary) hypertension: Secondary | ICD-10-CM | POA: Diagnosis not present

## 2016-04-13 DIAGNOSIS — R531 Weakness: Secondary | ICD-10-CM | POA: Diagnosis not present

## 2016-04-13 DIAGNOSIS — R404 Transient alteration of awareness: Secondary | ICD-10-CM | POA: Diagnosis not present

## 2016-04-13 DIAGNOSIS — Z85820 Personal history of malignant melanoma of skin: Secondary | ICD-10-CM | POA: Diagnosis not present

## 2016-04-13 DIAGNOSIS — Z79899 Other long term (current) drug therapy: Secondary | ICD-10-CM | POA: Diagnosis not present

## 2016-04-13 DIAGNOSIS — R112 Nausea with vomiting, unspecified: Secondary | ICD-10-CM | POA: Diagnosis not present

## 2016-04-13 DIAGNOSIS — Z8507 Personal history of malignant neoplasm of pancreas: Secondary | ICD-10-CM | POA: Diagnosis not present

## 2016-04-13 DIAGNOSIS — N400 Enlarged prostate without lower urinary tract symptoms: Secondary | ICD-10-CM | POA: Diagnosis not present

## 2016-04-14 DIAGNOSIS — D638 Anemia in other chronic diseases classified elsewhere: Secondary | ICD-10-CM | POA: Diagnosis not present

## 2016-04-14 DIAGNOSIS — I10 Essential (primary) hypertension: Secondary | ICD-10-CM | POA: Diagnosis not present

## 2016-04-14 DIAGNOSIS — N400 Enlarged prostate without lower urinary tract symptoms: Secondary | ICD-10-CM | POA: Diagnosis not present

## 2016-04-14 DIAGNOSIS — K2960 Other gastritis without bleeding: Secondary | ICD-10-CM | POA: Diagnosis not present

## 2016-04-14 DIAGNOSIS — Z8507 Personal history of malignant neoplasm of pancreas: Secondary | ICD-10-CM | POA: Diagnosis not present

## 2016-04-14 DIAGNOSIS — K2970 Gastritis, unspecified, without bleeding: Secondary | ICD-10-CM | POA: Diagnosis not present

## 2016-04-14 DIAGNOSIS — I2510 Atherosclerotic heart disease of native coronary artery without angina pectoris: Secondary | ICD-10-CM | POA: Diagnosis not present

## 2016-04-15 DIAGNOSIS — N400 Enlarged prostate without lower urinary tract symptoms: Secondary | ICD-10-CM | POA: Diagnosis not present

## 2016-04-15 DIAGNOSIS — K2960 Other gastritis without bleeding: Secondary | ICD-10-CM | POA: Diagnosis not present

## 2016-04-15 DIAGNOSIS — I2510 Atherosclerotic heart disease of native coronary artery without angina pectoris: Secondary | ICD-10-CM | POA: Diagnosis not present

## 2016-04-15 DIAGNOSIS — D638 Anemia in other chronic diseases classified elsewhere: Secondary | ICD-10-CM | POA: Diagnosis not present

## 2016-04-15 DIAGNOSIS — K2970 Gastritis, unspecified, without bleeding: Secondary | ICD-10-CM | POA: Diagnosis not present

## 2016-04-15 DIAGNOSIS — I10 Essential (primary) hypertension: Secondary | ICD-10-CM | POA: Diagnosis not present

## 2016-04-15 DIAGNOSIS — Z8507 Personal history of malignant neoplasm of pancreas: Secondary | ICD-10-CM | POA: Diagnosis not present

## 2016-04-16 DIAGNOSIS — Z8507 Personal history of malignant neoplasm of pancreas: Secondary | ICD-10-CM | POA: Diagnosis not present

## 2016-04-16 DIAGNOSIS — K2960 Other gastritis without bleeding: Secondary | ICD-10-CM | POA: Diagnosis not present

## 2016-04-16 DIAGNOSIS — I2510 Atherosclerotic heart disease of native coronary artery without angina pectoris: Secondary | ICD-10-CM | POA: Diagnosis not present

## 2016-04-16 DIAGNOSIS — K2970 Gastritis, unspecified, without bleeding: Secondary | ICD-10-CM | POA: Diagnosis not present

## 2016-04-16 DIAGNOSIS — N400 Enlarged prostate without lower urinary tract symptoms: Secondary | ICD-10-CM | POA: Diagnosis not present

## 2016-04-16 DIAGNOSIS — I10 Essential (primary) hypertension: Secondary | ICD-10-CM | POA: Diagnosis not present

## 2016-04-16 DIAGNOSIS — D638 Anemia in other chronic diseases classified elsewhere: Secondary | ICD-10-CM | POA: Diagnosis not present

## 2016-04-22 DIAGNOSIS — K769 Liver disease, unspecified: Secondary | ICD-10-CM | POA: Diagnosis not present

## 2016-04-22 DIAGNOSIS — Z9889 Other specified postprocedural states: Secondary | ICD-10-CM | POA: Diagnosis not present

## 2016-04-22 DIAGNOSIS — C250 Malignant neoplasm of head of pancreas: Secondary | ICD-10-CM | POA: Diagnosis not present

## 2016-04-22 DIAGNOSIS — N200 Calculus of kidney: Secondary | ICD-10-CM | POA: Diagnosis not present

## 2016-04-22 DIAGNOSIS — D490 Neoplasm of unspecified behavior of digestive system: Secondary | ICD-10-CM | POA: Diagnosis not present

## 2016-04-22 DIAGNOSIS — C259 Malignant neoplasm of pancreas, unspecified: Secondary | ICD-10-CM | POA: Diagnosis not present

## 2016-04-23 DIAGNOSIS — K5289 Other specified noninfective gastroenteritis and colitis: Secondary | ICD-10-CM | POA: Diagnosis not present

## 2016-06-18 DIAGNOSIS — M1049 Other secondary gout, multiple sites: Secondary | ICD-10-CM | POA: Diagnosis not present

## 2016-06-18 DIAGNOSIS — M791 Myalgia: Secondary | ICD-10-CM | POA: Diagnosis not present

## 2016-06-18 DIAGNOSIS — I10 Essential (primary) hypertension: Secondary | ICD-10-CM | POA: Diagnosis not present

## 2016-06-24 DIAGNOSIS — G4762 Sleep related leg cramps: Secondary | ICD-10-CM | POA: Diagnosis not present

## 2016-06-24 DIAGNOSIS — I10 Essential (primary) hypertension: Secondary | ICD-10-CM | POA: Diagnosis not present

## 2016-06-27 ENCOUNTER — Encounter: Payer: Self-pay | Admitting: *Deleted

## 2016-07-01 ENCOUNTER — Encounter: Payer: Self-pay | Admitting: *Deleted

## 2016-07-11 DIAGNOSIS — Z9889 Other specified postprocedural states: Secondary | ICD-10-CM | POA: Diagnosis not present

## 2016-07-11 DIAGNOSIS — R188 Other ascites: Secondary | ICD-10-CM | POA: Diagnosis not present

## 2016-07-11 DIAGNOSIS — K769 Liver disease, unspecified: Secondary | ICD-10-CM | POA: Diagnosis not present

## 2016-07-11 DIAGNOSIS — Z08 Encounter for follow-up examination after completed treatment for malignant neoplasm: Secondary | ICD-10-CM | POA: Diagnosis not present

## 2016-07-11 DIAGNOSIS — Z8507 Personal history of malignant neoplasm of pancreas: Secondary | ICD-10-CM | POA: Diagnosis not present

## 2016-07-11 DIAGNOSIS — R935 Abnormal findings on diagnostic imaging of other abdominal regions, including retroperitoneum: Secondary | ICD-10-CM | POA: Diagnosis not present

## 2016-07-11 DIAGNOSIS — K7689 Other specified diseases of liver: Secondary | ICD-10-CM | POA: Insufficient documentation

## 2016-07-11 DIAGNOSIS — C250 Malignant neoplasm of head of pancreas: Secondary | ICD-10-CM | POA: Diagnosis not present

## 2016-07-12 DIAGNOSIS — H3561 Retinal hemorrhage, right eye: Secondary | ICD-10-CM | POA: Diagnosis not present

## 2016-07-12 DIAGNOSIS — H40001 Preglaucoma, unspecified, right eye: Secondary | ICD-10-CM | POA: Diagnosis not present

## 2016-07-12 DIAGNOSIS — H524 Presbyopia: Secondary | ICD-10-CM | POA: Diagnosis not present

## 2016-07-12 DIAGNOSIS — H40013 Open angle with borderline findings, low risk, bilateral: Secondary | ICD-10-CM | POA: Diagnosis not present

## 2016-07-12 DIAGNOSIS — Z961 Presence of intraocular lens: Secondary | ICD-10-CM | POA: Diagnosis not present

## 2016-07-12 DIAGNOSIS — H43812 Vitreous degeneration, left eye: Secondary | ICD-10-CM | POA: Diagnosis not present

## 2016-07-12 DIAGNOSIS — I10 Essential (primary) hypertension: Secondary | ICD-10-CM | POA: Diagnosis not present

## 2016-07-12 DIAGNOSIS — H40002 Preglaucoma, unspecified, left eye: Secondary | ICD-10-CM | POA: Diagnosis not present

## 2016-07-12 DIAGNOSIS — H35371 Puckering of macula, right eye: Secondary | ICD-10-CM | POA: Diagnosis not present

## 2016-07-12 DIAGNOSIS — H2511 Age-related nuclear cataract, right eye: Secondary | ICD-10-CM | POA: Diagnosis not present

## 2016-07-12 DIAGNOSIS — H35032 Hypertensive retinopathy, left eye: Secondary | ICD-10-CM | POA: Diagnosis not present

## 2016-07-12 DIAGNOSIS — H35031 Hypertensive retinopathy, right eye: Secondary | ICD-10-CM | POA: Diagnosis not present

## 2016-07-19 DIAGNOSIS — H3561 Retinal hemorrhage, right eye: Secondary | ICD-10-CM | POA: Diagnosis not present

## 2016-07-19 DIAGNOSIS — H40013 Open angle with borderline findings, low risk, bilateral: Secondary | ICD-10-CM | POA: Diagnosis not present

## 2016-07-19 DIAGNOSIS — H35371 Puckering of macula, right eye: Secondary | ICD-10-CM | POA: Diagnosis not present

## 2016-07-19 DIAGNOSIS — Z961 Presence of intraocular lens: Secondary | ICD-10-CM | POA: Diagnosis not present

## 2016-07-19 DIAGNOSIS — H34831 Tributary (branch) retinal vein occlusion, right eye, with macular edema: Secondary | ICD-10-CM | POA: Diagnosis not present

## 2016-07-19 DIAGNOSIS — H34811 Central retinal vein occlusion, right eye, with macular edema: Secondary | ICD-10-CM | POA: Diagnosis not present

## 2016-07-19 DIAGNOSIS — H43812 Vitreous degeneration, left eye: Secondary | ICD-10-CM | POA: Diagnosis not present

## 2016-07-19 DIAGNOSIS — H25011 Cortical age-related cataract, right eye: Secondary | ICD-10-CM | POA: Diagnosis not present

## 2016-07-19 DIAGNOSIS — H35033 Hypertensive retinopathy, bilateral: Secondary | ICD-10-CM | POA: Diagnosis not present

## 2016-07-19 DIAGNOSIS — Z9842 Cataract extraction status, left eye: Secondary | ICD-10-CM | POA: Diagnosis not present

## 2016-07-19 DIAGNOSIS — H2511 Age-related nuclear cataract, right eye: Secondary | ICD-10-CM | POA: Diagnosis not present

## 2016-07-23 DIAGNOSIS — H34811 Central retinal vein occlusion, right eye, with macular edema: Secondary | ICD-10-CM | POA: Diagnosis not present

## 2016-08-20 DIAGNOSIS — H34811 Central retinal vein occlusion, right eye, with macular edema: Secondary | ICD-10-CM | POA: Diagnosis not present

## 2016-08-20 DIAGNOSIS — H43821 Vitreomacular adhesion, right eye: Secondary | ICD-10-CM | POA: Diagnosis not present

## 2016-08-20 DIAGNOSIS — H43812 Vitreous degeneration, left eye: Secondary | ICD-10-CM | POA: Diagnosis not present

## 2016-08-20 DIAGNOSIS — H35371 Puckering of macula, right eye: Secondary | ICD-10-CM | POA: Diagnosis not present

## 2016-09-19 DIAGNOSIS — K861 Other chronic pancreatitis: Secondary | ICD-10-CM | POA: Diagnosis not present

## 2016-09-19 DIAGNOSIS — M1009 Idiopathic gout, multiple sites: Secondary | ICD-10-CM | POA: Diagnosis not present

## 2016-09-19 DIAGNOSIS — I10 Essential (primary) hypertension: Secondary | ICD-10-CM | POA: Diagnosis not present

## 2016-09-19 DIAGNOSIS — N401 Enlarged prostate with lower urinary tract symptoms: Secondary | ICD-10-CM | POA: Diagnosis not present

## 2016-09-20 DIAGNOSIS — H43821 Vitreomacular adhesion, right eye: Secondary | ICD-10-CM | POA: Diagnosis not present

## 2016-09-20 DIAGNOSIS — H34811 Central retinal vein occlusion, right eye, with macular edema: Secondary | ICD-10-CM | POA: Diagnosis not present

## 2016-09-20 DIAGNOSIS — H35371 Puckering of macula, right eye: Secondary | ICD-10-CM | POA: Diagnosis not present

## 2016-09-20 DIAGNOSIS — H43813 Vitreous degeneration, bilateral: Secondary | ICD-10-CM | POA: Diagnosis not present

## 2016-10-07 DIAGNOSIS — Z85828 Personal history of other malignant neoplasm of skin: Secondary | ICD-10-CM | POA: Diagnosis not present

## 2016-10-07 DIAGNOSIS — C44319 Basal cell carcinoma of skin of other parts of face: Secondary | ICD-10-CM | POA: Diagnosis not present

## 2016-10-07 DIAGNOSIS — D485 Neoplasm of uncertain behavior of skin: Secondary | ICD-10-CM | POA: Diagnosis not present

## 2016-10-07 DIAGNOSIS — L570 Actinic keratosis: Secondary | ICD-10-CM | POA: Diagnosis not present

## 2016-10-07 DIAGNOSIS — J208 Acute bronchitis due to other specified organisms: Secondary | ICD-10-CM | POA: Diagnosis not present

## 2016-10-24 DIAGNOSIS — J40 Bronchitis, not specified as acute or chronic: Secondary | ICD-10-CM | POA: Diagnosis not present

## 2016-10-24 DIAGNOSIS — Z951 Presence of aortocoronary bypass graft: Secondary | ICD-10-CM | POA: Diagnosis not present

## 2016-10-24 DIAGNOSIS — N390 Urinary tract infection, site not specified: Secondary | ICD-10-CM | POA: Diagnosis not present

## 2016-10-24 DIAGNOSIS — R918 Other nonspecific abnormal finding of lung field: Secondary | ICD-10-CM | POA: Diagnosis not present

## 2016-10-24 DIAGNOSIS — J209 Acute bronchitis, unspecified: Secondary | ICD-10-CM | POA: Diagnosis not present

## 2016-11-01 DIAGNOSIS — H43813 Vitreous degeneration, bilateral: Secondary | ICD-10-CM | POA: Diagnosis not present

## 2016-11-01 DIAGNOSIS — H43821 Vitreomacular adhesion, right eye: Secondary | ICD-10-CM | POA: Diagnosis not present

## 2016-11-01 DIAGNOSIS — H34811 Central retinal vein occlusion, right eye, with macular edema: Secondary | ICD-10-CM | POA: Diagnosis not present

## 2016-11-01 DIAGNOSIS — H35371 Puckering of macula, right eye: Secondary | ICD-10-CM | POA: Diagnosis not present

## 2016-11-07 DIAGNOSIS — C44319 Basal cell carcinoma of skin of other parts of face: Secondary | ICD-10-CM | POA: Diagnosis not present

## 2016-11-07 DIAGNOSIS — C44519 Basal cell carcinoma of skin of other part of trunk: Secondary | ICD-10-CM | POA: Diagnosis not present

## 2016-11-12 DIAGNOSIS — J310 Chronic rhinitis: Secondary | ICD-10-CM | POA: Diagnosis not present

## 2016-11-20 DIAGNOSIS — Z4802 Encounter for removal of sutures: Secondary | ICD-10-CM | POA: Diagnosis not present

## 2016-11-29 DIAGNOSIS — Z23 Encounter for immunization: Secondary | ICD-10-CM | POA: Diagnosis not present

## 2016-12-13 DIAGNOSIS — R030 Elevated blood-pressure reading, without diagnosis of hypertension: Secondary | ICD-10-CM | POA: Diagnosis not present

## 2016-12-13 DIAGNOSIS — M542 Cervicalgia: Secondary | ICD-10-CM | POA: Diagnosis not present

## 2016-12-13 DIAGNOSIS — M4312 Spondylolisthesis, cervical region: Secondary | ICD-10-CM | POA: Diagnosis not present

## 2016-12-13 DIAGNOSIS — I10 Essential (primary) hypertension: Secondary | ICD-10-CM | POA: Diagnosis not present

## 2016-12-20 DIAGNOSIS — N401 Enlarged prostate with lower urinary tract symptoms: Secondary | ICD-10-CM | POA: Diagnosis not present

## 2016-12-20 DIAGNOSIS — M1049 Other secondary gout, multiple sites: Secondary | ICD-10-CM | POA: Diagnosis not present

## 2016-12-20 DIAGNOSIS — I10 Essential (primary) hypertension: Secondary | ICD-10-CM | POA: Diagnosis not present

## 2016-12-27 DIAGNOSIS — H35371 Puckering of macula, right eye: Secondary | ICD-10-CM | POA: Diagnosis not present

## 2016-12-27 DIAGNOSIS — H34811 Central retinal vein occlusion, right eye, with macular edema: Secondary | ICD-10-CM | POA: Diagnosis not present

## 2016-12-27 DIAGNOSIS — H43821 Vitreomacular adhesion, right eye: Secondary | ICD-10-CM | POA: Diagnosis not present

## 2016-12-27 DIAGNOSIS — H43813 Vitreous degeneration, bilateral: Secondary | ICD-10-CM | POA: Diagnosis not present

## 2016-12-30 DIAGNOSIS — M542 Cervicalgia: Secondary | ICD-10-CM | POA: Diagnosis not present

## 2017-01-01 DIAGNOSIS — M542 Cervicalgia: Secondary | ICD-10-CM | POA: Diagnosis not present

## 2017-01-06 DIAGNOSIS — M1049 Other secondary gout, multiple sites: Secondary | ICD-10-CM | POA: Diagnosis not present

## 2017-01-06 DIAGNOSIS — I10 Essential (primary) hypertension: Secondary | ICD-10-CM | POA: Diagnosis not present

## 2017-01-06 DIAGNOSIS — N401 Enlarged prostate with lower urinary tract symptoms: Secondary | ICD-10-CM | POA: Diagnosis not present

## 2017-01-09 DIAGNOSIS — N201 Calculus of ureter: Secondary | ICD-10-CM | POA: Diagnosis not present

## 2017-01-09 DIAGNOSIS — R61 Generalized hyperhidrosis: Secondary | ICD-10-CM | POA: Diagnosis not present

## 2017-01-09 DIAGNOSIS — Z9889 Other specified postprocedural states: Secondary | ICD-10-CM | POA: Diagnosis not present

## 2017-01-09 DIAGNOSIS — Z8507 Personal history of malignant neoplasm of pancreas: Secondary | ICD-10-CM | POA: Diagnosis not present

## 2017-01-09 DIAGNOSIS — C250 Malignant neoplasm of head of pancreas: Secondary | ICD-10-CM | POA: Diagnosis not present

## 2017-01-09 DIAGNOSIS — N200 Calculus of kidney: Secondary | ICD-10-CM | POA: Diagnosis not present

## 2017-01-09 DIAGNOSIS — Q6102 Congenital multiple renal cysts: Secondary | ICD-10-CM | POA: Diagnosis not present

## 2017-01-09 DIAGNOSIS — K7689 Other specified diseases of liver: Secondary | ICD-10-CM | POA: Diagnosis not present

## 2017-01-13 DIAGNOSIS — N201 Calculus of ureter: Secondary | ICD-10-CM | POA: Diagnosis not present

## 2017-01-13 DIAGNOSIS — C250 Malignant neoplasm of head of pancreas: Secondary | ICD-10-CM | POA: Diagnosis not present

## 2017-01-13 DIAGNOSIS — N202 Calculus of kidney with calculus of ureter: Secondary | ICD-10-CM | POA: Diagnosis not present

## 2017-01-15 DIAGNOSIS — I493 Ventricular premature depolarization: Secondary | ICD-10-CM | POA: Diagnosis not present

## 2017-01-15 DIAGNOSIS — G8929 Other chronic pain: Secondary | ICD-10-CM | POA: Diagnosis not present

## 2017-01-15 DIAGNOSIS — Z8507 Personal history of malignant neoplasm of pancreas: Secondary | ICD-10-CM | POA: Diagnosis not present

## 2017-01-15 DIAGNOSIS — Z951 Presence of aortocoronary bypass graft: Secondary | ICD-10-CM | POA: Diagnosis not present

## 2017-01-15 DIAGNOSIS — I10 Essential (primary) hypertension: Secondary | ICD-10-CM | POA: Diagnosis not present

## 2017-01-15 DIAGNOSIS — N201 Calculus of ureter: Secondary | ICD-10-CM | POA: Diagnosis not present

## 2017-01-15 DIAGNOSIS — J309 Allergic rhinitis, unspecified: Secondary | ICD-10-CM | POA: Diagnosis not present

## 2017-01-15 DIAGNOSIS — G2581 Restless legs syndrome: Secondary | ICD-10-CM | POA: Diagnosis not present

## 2017-01-15 DIAGNOSIS — R110 Nausea: Secondary | ICD-10-CM | POA: Diagnosis not present

## 2017-01-15 DIAGNOSIS — I517 Cardiomegaly: Secondary | ICD-10-CM | POA: Diagnosis not present

## 2017-01-15 DIAGNOSIS — E291 Testicular hypofunction: Secondary | ICD-10-CM | POA: Diagnosis not present

## 2017-01-15 DIAGNOSIS — K219 Gastro-esophageal reflux disease without esophagitis: Secondary | ICD-10-CM | POA: Diagnosis not present

## 2017-01-15 DIAGNOSIS — M109 Gout, unspecified: Secondary | ICD-10-CM | POA: Diagnosis not present

## 2017-01-15 DIAGNOSIS — N400 Enlarged prostate without lower urinary tract symptoms: Secondary | ICD-10-CM | POA: Diagnosis not present

## 2017-01-15 DIAGNOSIS — I2510 Atherosclerotic heart disease of native coronary artery without angina pectoris: Secondary | ICD-10-CM | POA: Diagnosis not present

## 2017-01-21 DIAGNOSIS — Z951 Presence of aortocoronary bypass graft: Secondary | ICD-10-CM | POA: Diagnosis not present

## 2017-01-21 DIAGNOSIS — N201 Calculus of ureter: Secondary | ICD-10-CM | POA: Diagnosis not present

## 2017-01-21 DIAGNOSIS — N400 Enlarged prostate without lower urinary tract symptoms: Secondary | ICD-10-CM | POA: Diagnosis not present

## 2017-01-21 DIAGNOSIS — I10 Essential (primary) hypertension: Secondary | ICD-10-CM | POA: Diagnosis not present

## 2017-01-21 DIAGNOSIS — K219 Gastro-esophageal reflux disease without esophagitis: Secondary | ICD-10-CM | POA: Diagnosis not present

## 2017-01-21 DIAGNOSIS — I2510 Atherosclerotic heart disease of native coronary artery without angina pectoris: Secondary | ICD-10-CM | POA: Diagnosis not present

## 2017-01-25 DIAGNOSIS — Z883 Allergy status to other anti-infective agents status: Secondary | ICD-10-CM | POA: Diagnosis not present

## 2017-01-25 DIAGNOSIS — N200 Calculus of kidney: Secondary | ICD-10-CM | POA: Diagnosis not present

## 2017-01-25 DIAGNOSIS — Z8249 Family history of ischemic heart disease and other diseases of the circulatory system: Secondary | ICD-10-CM | POA: Diagnosis not present

## 2017-01-25 DIAGNOSIS — Z888 Allergy status to other drugs, medicaments and biological substances status: Secondary | ICD-10-CM | POA: Diagnosis not present

## 2017-01-25 DIAGNOSIS — Z8507 Personal history of malignant neoplasm of pancreas: Secondary | ICD-10-CM | POA: Diagnosis not present

## 2017-01-25 DIAGNOSIS — E785 Hyperlipidemia, unspecified: Secondary | ICD-10-CM | POA: Diagnosis not present

## 2017-01-25 DIAGNOSIS — Z886 Allergy status to analgesic agent status: Secondary | ICD-10-CM | POA: Diagnosis not present

## 2017-01-25 DIAGNOSIS — Z9049 Acquired absence of other specified parts of digestive tract: Secondary | ICD-10-CM | POA: Diagnosis not present

## 2017-01-25 DIAGNOSIS — R030 Elevated blood-pressure reading, without diagnosis of hypertension: Secondary | ICD-10-CM | POA: Diagnosis not present

## 2017-01-25 DIAGNOSIS — Z955 Presence of coronary angioplasty implant and graft: Secondary | ICD-10-CM | POA: Diagnosis not present

## 2017-01-25 DIAGNOSIS — Z808 Family history of malignant neoplasm of other organs or systems: Secondary | ICD-10-CM | POA: Diagnosis not present

## 2017-01-25 DIAGNOSIS — E860 Dehydration: Secondary | ICD-10-CM | POA: Diagnosis not present

## 2017-01-25 DIAGNOSIS — I2510 Atherosclerotic heart disease of native coronary artery without angina pectoris: Secondary | ICD-10-CM | POA: Diagnosis not present

## 2017-01-25 DIAGNOSIS — N23 Unspecified renal colic: Secondary | ICD-10-CM | POA: Diagnosis not present

## 2017-01-25 DIAGNOSIS — Z87442 Personal history of urinary calculi: Secondary | ICD-10-CM | POA: Diagnosis not present

## 2017-01-25 DIAGNOSIS — M109 Gout, unspecified: Secondary | ICD-10-CM | POA: Diagnosis not present

## 2017-01-25 DIAGNOSIS — Z90411 Acquired partial absence of pancreas: Secondary | ICD-10-CM | POA: Diagnosis not present

## 2017-01-25 DIAGNOSIS — Z79899 Other long term (current) drug therapy: Secondary | ICD-10-CM | POA: Diagnosis not present

## 2017-01-25 DIAGNOSIS — R112 Nausea with vomiting, unspecified: Secondary | ICD-10-CM | POA: Diagnosis not present

## 2017-01-25 DIAGNOSIS — I10 Essential (primary) hypertension: Secondary | ICD-10-CM | POA: Diagnosis not present

## 2017-01-25 DIAGNOSIS — N390 Urinary tract infection, site not specified: Secondary | ICD-10-CM | POA: Diagnosis not present

## 2017-01-25 DIAGNOSIS — Z8489 Family history of other specified conditions: Secondary | ICD-10-CM | POA: Diagnosis not present

## 2017-01-25 DIAGNOSIS — R319 Hematuria, unspecified: Secondary | ICD-10-CM | POA: Diagnosis not present

## 2017-01-25 DIAGNOSIS — N400 Enlarged prostate without lower urinary tract symptoms: Secondary | ICD-10-CM | POA: Diagnosis not present

## 2017-01-26 DIAGNOSIS — I2510 Atherosclerotic heart disease of native coronary artery without angina pectoris: Secondary | ICD-10-CM | POA: Diagnosis not present

## 2017-01-26 DIAGNOSIS — N390 Urinary tract infection, site not specified: Secondary | ICD-10-CM | POA: Diagnosis not present

## 2017-01-26 DIAGNOSIS — N200 Calculus of kidney: Secondary | ICD-10-CM | POA: Diagnosis not present

## 2017-01-26 DIAGNOSIS — Z8507 Personal history of malignant neoplasm of pancreas: Secondary | ICD-10-CM | POA: Diagnosis not present

## 2017-01-26 DIAGNOSIS — E860 Dehydration: Secondary | ICD-10-CM | POA: Diagnosis not present

## 2017-01-27 DIAGNOSIS — Z8507 Personal history of malignant neoplasm of pancreas: Secondary | ICD-10-CM | POA: Diagnosis not present

## 2017-01-27 DIAGNOSIS — E860 Dehydration: Secondary | ICD-10-CM | POA: Diagnosis not present

## 2017-01-27 DIAGNOSIS — N200 Calculus of kidney: Secondary | ICD-10-CM | POA: Diagnosis not present

## 2017-01-27 DIAGNOSIS — I2510 Atherosclerotic heart disease of native coronary artery without angina pectoris: Secondary | ICD-10-CM | POA: Diagnosis not present

## 2017-01-27 DIAGNOSIS — N390 Urinary tract infection, site not specified: Secondary | ICD-10-CM | POA: Diagnosis not present

## 2017-02-06 DIAGNOSIS — N200 Calculus of kidney: Secondary | ICD-10-CM | POA: Diagnosis not present

## 2017-02-06 DIAGNOSIS — N3081 Other cystitis with hematuria: Secondary | ICD-10-CM | POA: Diagnosis not present

## 2017-02-07 DIAGNOSIS — H35371 Puckering of macula, right eye: Secondary | ICD-10-CM | POA: Diagnosis not present

## 2017-02-07 DIAGNOSIS — H43821 Vitreomacular adhesion, right eye: Secondary | ICD-10-CM | POA: Diagnosis not present

## 2017-02-07 DIAGNOSIS — H34811 Central retinal vein occlusion, right eye, with macular edema: Secondary | ICD-10-CM | POA: Diagnosis not present

## 2017-02-07 DIAGNOSIS — H43813 Vitreous degeneration, bilateral: Secondary | ICD-10-CM | POA: Diagnosis not present

## 2017-02-11 DIAGNOSIS — N201 Calculus of ureter: Secondary | ICD-10-CM | POA: Diagnosis not present

## 2017-02-11 DIAGNOSIS — Z466 Encounter for fitting and adjustment of urinary device: Secondary | ICD-10-CM | POA: Diagnosis not present

## 2017-02-19 DIAGNOSIS — K861 Other chronic pancreatitis: Secondary | ICD-10-CM | POA: Diagnosis not present

## 2017-02-19 DIAGNOSIS — I2510 Atherosclerotic heart disease of native coronary artery without angina pectoris: Secondary | ICD-10-CM | POA: Diagnosis not present

## 2017-02-19 DIAGNOSIS — N202 Calculus of kidney with calculus of ureter: Secondary | ICD-10-CM | POA: Diagnosis not present

## 2017-02-19 DIAGNOSIS — R109 Unspecified abdominal pain: Secondary | ICD-10-CM | POA: Diagnosis not present

## 2017-02-19 DIAGNOSIS — N390 Urinary tract infection, site not specified: Secondary | ICD-10-CM | POA: Diagnosis not present

## 2017-02-19 DIAGNOSIS — J942 Hemothorax: Secondary | ICD-10-CM | POA: Diagnosis not present

## 2017-02-19 DIAGNOSIS — R030 Elevated blood-pressure reading, without diagnosis of hypertension: Secondary | ICD-10-CM | POA: Diagnosis not present

## 2017-02-19 DIAGNOSIS — R112 Nausea with vomiting, unspecified: Secondary | ICD-10-CM | POA: Diagnosis not present

## 2017-02-19 DIAGNOSIS — Z87442 Personal history of urinary calculi: Secondary | ICD-10-CM | POA: Diagnosis not present

## 2017-02-19 DIAGNOSIS — N200 Calculus of kidney: Secondary | ICD-10-CM | POA: Diagnosis not present

## 2017-02-19 DIAGNOSIS — I10 Essential (primary) hypertension: Secondary | ICD-10-CM | POA: Diagnosis not present

## 2017-02-20 DIAGNOSIS — Z885 Allergy status to narcotic agent status: Secondary | ICD-10-CM | POA: Diagnosis not present

## 2017-02-20 DIAGNOSIS — N400 Enlarged prostate without lower urinary tract symptoms: Secondary | ICD-10-CM | POA: Diagnosis present

## 2017-02-20 DIAGNOSIS — Z888 Allergy status to other drugs, medicaments and biological substances status: Secondary | ICD-10-CM | POA: Diagnosis not present

## 2017-02-20 DIAGNOSIS — N200 Calculus of kidney: Secondary | ICD-10-CM | POA: Diagnosis not present

## 2017-02-20 DIAGNOSIS — N390 Urinary tract infection, site not specified: Secondary | ICD-10-CM | POA: Diagnosis present

## 2017-02-20 DIAGNOSIS — I10 Essential (primary) hypertension: Secondary | ICD-10-CM | POA: Diagnosis present

## 2017-02-20 DIAGNOSIS — Z881 Allergy status to other antibiotic agents status: Secondary | ICD-10-CM | POA: Diagnosis not present

## 2017-02-20 DIAGNOSIS — E785 Hyperlipidemia, unspecified: Secondary | ICD-10-CM | POA: Diagnosis present

## 2017-02-20 DIAGNOSIS — Z79899 Other long term (current) drug therapy: Secondary | ICD-10-CM | POA: Diagnosis not present

## 2017-02-20 DIAGNOSIS — R12 Heartburn: Secondary | ICD-10-CM | POA: Diagnosis present

## 2017-02-20 DIAGNOSIS — Z8507 Personal history of malignant neoplasm of pancreas: Secondary | ICD-10-CM | POA: Diagnosis not present

## 2017-02-20 DIAGNOSIS — Z87442 Personal history of urinary calculi: Secondary | ICD-10-CM | POA: Diagnosis not present

## 2017-02-20 DIAGNOSIS — M109 Gout, unspecified: Secondary | ICD-10-CM | POA: Diagnosis present

## 2017-02-20 DIAGNOSIS — Z960 Presence of urogenital implants: Secondary | ICD-10-CM | POA: Diagnosis present

## 2017-02-20 DIAGNOSIS — I2510 Atherosclerotic heart disease of native coronary artery without angina pectoris: Secondary | ICD-10-CM | POA: Diagnosis present

## 2017-02-20 DIAGNOSIS — N202 Calculus of kidney with calculus of ureter: Secondary | ICD-10-CM | POA: Diagnosis present

## 2017-02-20 DIAGNOSIS — Z7982 Long term (current) use of aspirin: Secondary | ICD-10-CM | POA: Diagnosis not present

## 2017-02-20 DIAGNOSIS — K861 Other chronic pancreatitis: Secondary | ICD-10-CM | POA: Diagnosis present

## 2017-02-27 DIAGNOSIS — N200 Calculus of kidney: Secondary | ICD-10-CM | POA: Diagnosis not present

## 2017-03-04 DIAGNOSIS — N200 Calculus of kidney: Secondary | ICD-10-CM | POA: Diagnosis not present

## 2017-03-04 DIAGNOSIS — Z466 Encounter for fitting and adjustment of urinary device: Secondary | ICD-10-CM | POA: Diagnosis not present

## 2017-03-04 DIAGNOSIS — N202 Calculus of kidney with calculus of ureter: Secondary | ICD-10-CM | POA: Diagnosis not present

## 2017-03-21 DIAGNOSIS — H43813 Vitreous degeneration, bilateral: Secondary | ICD-10-CM | POA: Diagnosis not present

## 2017-03-21 DIAGNOSIS — N200 Calculus of kidney: Secondary | ICD-10-CM | POA: Diagnosis not present

## 2017-03-21 DIAGNOSIS — N138 Other obstructive and reflux uropathy: Secondary | ICD-10-CM | POA: Diagnosis not present

## 2017-03-21 DIAGNOSIS — H3561 Retinal hemorrhage, right eye: Secondary | ICD-10-CM | POA: Diagnosis not present

## 2017-03-21 DIAGNOSIS — N401 Enlarged prostate with lower urinary tract symptoms: Secondary | ICD-10-CM | POA: Diagnosis not present

## 2017-03-21 DIAGNOSIS — H34811 Central retinal vein occlusion, right eye, with macular edema: Secondary | ICD-10-CM | POA: Diagnosis not present

## 2017-05-02 DIAGNOSIS — H35371 Puckering of macula, right eye: Secondary | ICD-10-CM | POA: Diagnosis not present

## 2017-05-02 DIAGNOSIS — H43821 Vitreomacular adhesion, right eye: Secondary | ICD-10-CM | POA: Diagnosis not present

## 2017-05-02 DIAGNOSIS — H3561 Retinal hemorrhage, right eye: Secondary | ICD-10-CM | POA: Diagnosis not present

## 2017-05-02 DIAGNOSIS — H34811 Central retinal vein occlusion, right eye, with macular edema: Secondary | ICD-10-CM | POA: Diagnosis not present

## 2017-05-12 DIAGNOSIS — G4709 Other insomnia: Secondary | ICD-10-CM | POA: Diagnosis not present

## 2017-05-12 DIAGNOSIS — M1009 Idiopathic gout, multiple sites: Secondary | ICD-10-CM | POA: Diagnosis not present

## 2017-05-12 DIAGNOSIS — I10 Essential (primary) hypertension: Secondary | ICD-10-CM | POA: Diagnosis not present

## 2017-05-17 DIAGNOSIS — I10 Essential (primary) hypertension: Secondary | ICD-10-CM | POA: Diagnosis not present

## 2017-05-17 DIAGNOSIS — R7881 Bacteremia: Secondary | ICD-10-CM | POA: Diagnosis not present

## 2017-05-17 DIAGNOSIS — I2510 Atherosclerotic heart disease of native coronary artery without angina pectoris: Secondary | ICD-10-CM | POA: Diagnosis not present

## 2017-05-17 DIAGNOSIS — R5383 Other fatigue: Secondary | ICD-10-CM | POA: Diagnosis not present

## 2017-05-17 DIAGNOSIS — R509 Fever, unspecified: Secondary | ICD-10-CM | POA: Diagnosis not present

## 2017-05-17 DIAGNOSIS — K861 Other chronic pancreatitis: Secondary | ICD-10-CM | POA: Diagnosis not present

## 2017-05-17 DIAGNOSIS — N400 Enlarged prostate without lower urinary tract symptoms: Secondary | ICD-10-CM | POA: Diagnosis not present

## 2017-05-18 DIAGNOSIS — R404 Transient alteration of awareness: Secondary | ICD-10-CM | POA: Diagnosis not present

## 2017-05-18 DIAGNOSIS — R531 Weakness: Secondary | ICD-10-CM | POA: Diagnosis not present

## 2017-05-19 DIAGNOSIS — I2510 Atherosclerotic heart disease of native coronary artery without angina pectoris: Secondary | ICD-10-CM | POA: Diagnosis present

## 2017-05-19 DIAGNOSIS — R5383 Other fatigue: Secondary | ICD-10-CM | POA: Diagnosis not present

## 2017-05-19 DIAGNOSIS — K861 Other chronic pancreatitis: Secondary | ICD-10-CM | POA: Diagnosis present

## 2017-05-19 DIAGNOSIS — I10 Essential (primary) hypertension: Secondary | ICD-10-CM | POA: Diagnosis present

## 2017-05-19 DIAGNOSIS — Z7982 Long term (current) use of aspirin: Secondary | ICD-10-CM | POA: Diagnosis not present

## 2017-05-19 DIAGNOSIS — Z8507 Personal history of malignant neoplasm of pancreas: Secondary | ICD-10-CM | POA: Diagnosis not present

## 2017-05-19 DIAGNOSIS — N400 Enlarged prostate without lower urinary tract symptoms: Secondary | ICD-10-CM | POA: Diagnosis present

## 2017-05-19 DIAGNOSIS — M109 Gout, unspecified: Secondary | ICD-10-CM | POA: Diagnosis present

## 2017-05-19 DIAGNOSIS — R7881 Bacteremia: Secondary | ICD-10-CM | POA: Diagnosis present

## 2017-05-19 DIAGNOSIS — Z79899 Other long term (current) drug therapy: Secondary | ICD-10-CM | POA: Diagnosis not present

## 2017-05-19 DIAGNOSIS — Z888 Allergy status to other drugs, medicaments and biological substances status: Secondary | ICD-10-CM | POA: Diagnosis not present

## 2017-05-19 DIAGNOSIS — Z87442 Personal history of urinary calculi: Secondary | ICD-10-CM | POA: Diagnosis not present

## 2017-05-19 DIAGNOSIS — Z885 Allergy status to narcotic agent status: Secondary | ICD-10-CM | POA: Diagnosis not present

## 2017-05-19 DIAGNOSIS — R509 Fever, unspecified: Secondary | ICD-10-CM | POA: Diagnosis not present

## 2017-05-19 DIAGNOSIS — Z881 Allergy status to other antibiotic agents status: Secondary | ICD-10-CM | POA: Diagnosis not present

## 2017-05-27 DIAGNOSIS — N3020 Other chronic cystitis without hematuria: Secondary | ICD-10-CM | POA: Diagnosis not present

## 2017-06-16 ENCOUNTER — Encounter: Payer: Self-pay | Admitting: *Deleted

## 2017-06-17 ENCOUNTER — Encounter: Payer: Self-pay | Admitting: *Deleted

## 2017-06-17 ENCOUNTER — Telehealth: Payer: Self-pay | Admitting: Cardiology

## 2017-06-17 ENCOUNTER — Ambulatory Visit (INDEPENDENT_AMBULATORY_CARE_PROVIDER_SITE_OTHER): Payer: Medicare Other | Admitting: Cardiology

## 2017-06-17 ENCOUNTER — Encounter: Payer: Self-pay | Admitting: Cardiology

## 2017-06-17 VITALS — BP 155/76 | HR 81 | Ht 69.0 in | Wt 148.0 lb

## 2017-06-17 DIAGNOSIS — I350 Nonrheumatic aortic (valve) stenosis: Secondary | ICD-10-CM | POA: Diagnosis not present

## 2017-06-17 DIAGNOSIS — I25119 Atherosclerotic heart disease of native coronary artery with unspecified angina pectoris: Secondary | ICD-10-CM

## 2017-06-17 DIAGNOSIS — E782 Mixed hyperlipidemia: Secondary | ICD-10-CM

## 2017-06-17 DIAGNOSIS — I10 Essential (primary) hypertension: Secondary | ICD-10-CM

## 2017-06-17 NOTE — Telephone Encounter (Signed)
Echo scheduled in [facility] Jul 16, 2017

## 2017-06-17 NOTE — Progress Notes (Signed)
Cardiology Office Note  Date: 06/17/2017   ID: Zekiel, Torian 07/27/1945, MRN [id number]  PCP: Neale Burly, MD  Evaluating cardiologist: Rozann Lesches, MD   Chief Complaint  Patient presents with  . Coronary Artery Disease    History of Present Illness: HARUO STEPANEK is a 72 y.o. male patient of Dr. Bronson Ing last seen in February 2018 that I am meeting for the first time today.  I reviewed his records and updated the chart.  He presents for a routine visit.  Denies any angina symptoms or use of nitroglycerin.  He describes himself as a Animator, and states that he stays busy doing various chores.  He reports NYHA class II dyspnea, no syncope.  I went over his medications.  Cardiac regimen includes aspirin, Norvasc, Lipitor, Lopressor, and as needed nitroglycerin.  We are requesting his most recent lab work from Dr. Sherrie Sport.  I personally reviewed his ECG today which shows sinus rhythm with possible old inferior infarct pattern.  He has not undergone interval ischemic testing and has generally preferred observation in the absence of progressive symptoms.  We did discuss this issue today.  He has a history of pancreatic cancer status post Whipple procedure, did not undergo subsequent chemotherapy.  Seems to have been relatively stable since then, still follows with an oncologist at [facility].  He had an echocardiogram done in 2017 demonstrating both aortic stenosis and pulmonic regurgitation as outlined below.  He has had no follow-up for this.  Past Medical History:  Diagnosis Date  . Arthritis   . CAD (coronary artery disease)    Multivessel status post CABG  . Carotid artery disease ([place])   . Cervical disc disease   . Essential hypertension   . Mixed hyperlipidemia   . Neuropathy   . Palpitations   . Pancreatic cancer [facility])    Surgery December 2012, chemotherapy recommended but patient declined    Past Surgical History:  Procedure Laterality Date    . BACK SURGERY    . BACK SURGERY     x 2. One 8 yrs ago and one in 1980  . CERVICAL DISC SURGERY     x 2 in 1997 and 2010  . CORONARY ARTERY BYPASS GRAFT  1995   [facility])    Current Outpatient Medications  Medication Sig Dispense Refill  . allopurinol (ZYLOPRIM) 300 MG tablet Take 1 tablet by mouth daily.    Marland Kitchen amLODipine (NORVASC) 5 MG tablet Take 1 tablet by mouth daily.    Marland Kitchen aspirin 81 MG tablet Take 81 mg by mouth daily.    Marland Kitchen atorvastatin (LIPITOR) 20 MG tablet Take 1 tablet (20 mg total) by mouth daily. 30 tablet 11  . Cholecalciferol (VITAMIN D3) 400 UNITS CAPS Take 1 capsule by mouth daily.      . Cyanocobalamin (B-12) 1000 MCG/ML KIT Inject 1 mL as directed every 30 (thirty) days.    Marland Kitchen gabapentin (NEURONTIN) 600 MG tablet Take 600 mg by mouth 4 (four) times daily.     . metoprolol tartrate (LOPRESSOR) 25 MG tablet Take 1 tablet (25 mg total) by mouth 2 (two) times daily. 60 tablet 11  . nitroGLYCERIN (NITROSTAT) 0.4 MG SL tablet Place 1 tablet (0.4 mg total) under the tongue every 5 (five) minutes as needed for chest pain. 25 tablet 3  . ondansetron (ZOFRAN) 8 MG tablet Take 8 mg by mouth every 8 (eight) hours as needed. For nausea.     Marland Kitchen oxyCODONE (OXY  IR/ROXICODONE) 5 MG immediate release tablet Take 5 mg by mouth every 4 (four) hours as needed. For pain.     Marland Kitchen sulfamethoxazole-trimethoprim (BACTRIM DS,SEPTRA DS) 800-160 MG tablet Take 1 tablet by mouth daily.    . Tamsulosin HCl (FLOMAX) 0.4 MG CAPS Take 1 tablet by mouth Daily.    Marland Kitchen testosterone cypionate (DEPOTESTOSTERONE CYPIONATE) 200 MG/ML injection Inject 1 mL into the muscle every 30 (thirty) days.    Marland Kitchen ZENPEP 20000 units CPEP Take 1 tablet by mouth 4 (four) times daily -  before meals and at bedtime.     No current facility-administered medications for this visit.    Allergies:  Codeine; Compazine  [prochlorperazine edisylate]; Hydroxyzine pamoate; Meperidine; Meperidine hcl; Metoclopramide; Hydroxyzine hcl; Other;  Oxycodone-acetaminophen; Prochlorperazine; and Prochlorperazine edisylate   Social History: The patient  reports that he has never smoked. He has never used smokeless tobacco. He reports that he does not drink alcohol or use drugs.   Family History: The patient's family history includes COPD in his father; Cancer in his mother; Coronary artery disease in his unknown relative.   ROS:  Please see the history of present illness. Otherwise, complete review of systems is positive for none.  All other systems are reviewed and negative.   Physical Exam: VS:  BP (!) 155/76   Pulse 81   Ht _0  (1.753 m)   Wt 148 lb (67.1 kg)   BMI 21.86 kg/m , BMI Body mass index is 21.86 kg/m.  Wt Readings from Last 3 Encounters:  06/17/17 148 lb (67.1 kg)  04/08/16 154 lb 3.2 oz (69.9 kg)  05/03/11 144 lb 6.4 oz (65.5 kg)    General: Patient appears comfortable at rest. HEENT: Conjunctiva and lids normal, oropharynx clear. Neck: Supple, no elevated JVP or carotid bruits, no thyromegaly. Lungs: Clear to auscultation, nonlabored breathing at rest. Cardiac: Regular rate and rhythm, no S3. 3/6 systolic murmur, no pericardial rub. Abdomen: Soft, nontender, bowel sounds present. Extremities: No pitting edema, distal pulses 2+. Skin: Warm and dry. Musculoskeletal: No kyphosis. Neuropsychiatric: Alert and oriented x3, affect grossly appropriate.  ECG: I personally reviewed the tracing from 02/03/2016 which showed sinus rhythm with poor R wave progression and possible old inferior infarct pattern.  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  Assessment and Plan:  1.  Multivessel CAD status post CABG in 1995.  Cardiac catheterization in 2006 revealed patent bypass grafts.  He has had no interval ischemic testing and remains comfortable with observation in the absence of progressive angina symptoms on medical therapy.  I did review his follow-up ECG.  2.  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  Aortic murmur is most prominent.  We will obtain a follow-up echocardiogram for surveillance.  3.  Essential hypertension, on Norvasc and Lopressor followed by Dr. Sherrie Sport.  4.  Mixed hyperlipidemia, on Lipitor.  Requesting most recent lab work from Dr. Sherrie Sport.  Current medicines were reviewed with the patient today.   Orders Placed This Encounter  Procedures  . EKG 12-Lead  . ECHOCARDIOGRAM COMPLETE    Disposition: Call  with test results and disposition.  Signed, Satira Sark, MD, [facility] 06/17/2017 4:52 PM    [facility] [place], [place], [place] [address] Phone: [phone]; Fax: [phone]

## 2017-06-17 NOTE — Patient Instructions (Signed)
Medication Instructions:  Your physician recommends that you continue on your current medications as directed. Please refer to the Current Medication list given to you today.   Labwork: I will request labs from pcp.  Testing/Procedures: Your physician has requested that you have an echocardiogram. Echocardiography is a painless test that uses sound waves to create images of your heart. It provides your doctor with information about the size and shape of your heart and how well your heart's chambers and valves are working. This procedure takes approximately one hour. There are no restrictions for this procedure.    Follow-Up: Your physician recommends that you schedule a follow-up appointment in:  Pending test results.    Any Other Special Instructions Will Be Listed Below (If Applicable).     If you need a refill on your cardiac medications before your next appointment, please call your pharmacy.

## 2017-06-27 DIAGNOSIS — H3561 Retinal hemorrhage, right eye: Secondary | ICD-10-CM | POA: Diagnosis not present

## 2017-06-27 DIAGNOSIS — H35371 Puckering of macula, right eye: Secondary | ICD-10-CM | POA: Diagnosis not present

## 2017-06-27 DIAGNOSIS — H43821 Vitreomacular adhesion, right eye: Secondary | ICD-10-CM | POA: Diagnosis not present

## 2017-06-27 DIAGNOSIS — H34811 Central retinal vein occlusion, right eye, with macular edema: Secondary | ICD-10-CM | POA: Diagnosis not present

## 2017-07-08 DIAGNOSIS — M1049 Other secondary gout, multiple sites: Secondary | ICD-10-CM | POA: Diagnosis not present

## 2017-07-08 DIAGNOSIS — I10 Essential (primary) hypertension: Secondary | ICD-10-CM | POA: Diagnosis not present

## 2017-07-08 DIAGNOSIS — M7918 Myalgia, other site: Secondary | ICD-10-CM | POA: Diagnosis not present

## 2017-07-16 ENCOUNTER — Other Ambulatory Visit: Payer: Medicare Other

## 2017-07-17 DIAGNOSIS — K769 Liver disease, unspecified: Secondary | ICD-10-CM | POA: Diagnosis not present

## 2017-07-17 DIAGNOSIS — C250 Malignant neoplasm of head of pancreas: Secondary | ICD-10-CM | POA: Diagnosis not present

## 2017-07-17 DIAGNOSIS — Z08 Encounter for follow-up examination after completed treatment for malignant neoplasm: Secondary | ICD-10-CM | POA: Diagnosis not present

## 2017-07-17 DIAGNOSIS — K7689 Other specified diseases of liver: Secondary | ICD-10-CM | POA: Diagnosis not present

## 2017-07-17 DIAGNOSIS — Z8507 Personal history of malignant neoplasm of pancreas: Secondary | ICD-10-CM | POA: Diagnosis not present

## 2017-07-17 DIAGNOSIS — N281 Cyst of kidney, acquired: Secondary | ICD-10-CM | POA: Diagnosis not present

## 2017-07-26 DIAGNOSIS — N289 Disorder of kidney and ureter, unspecified: Secondary | ICD-10-CM | POA: Diagnosis not present

## 2017-07-26 DIAGNOSIS — R42 Dizziness and giddiness: Secondary | ICD-10-CM | POA: Diagnosis not present

## 2017-07-26 DIAGNOSIS — D649 Anemia, unspecified: Secondary | ICD-10-CM | POA: Diagnosis not present

## 2017-07-28 DIAGNOSIS — Z0000 Encounter for general adult medical examination without abnormal findings: Secondary | ICD-10-CM | POA: Diagnosis not present

## 2017-07-28 DIAGNOSIS — H8143 Vertigo of central origin, bilateral: Secondary | ICD-10-CM | POA: Diagnosis not present

## 2017-07-28 DIAGNOSIS — Z1389 Encounter for screening for other disorder: Secondary | ICD-10-CM | POA: Diagnosis not present

## 2017-08-07 ENCOUNTER — Other Ambulatory Visit: Payer: Medicare Other

## 2017-08-07 DIAGNOSIS — H43813 Vitreous degeneration, bilateral: Secondary | ICD-10-CM | POA: Diagnosis not present

## 2017-08-07 DIAGNOSIS — H43821 Vitreomacular adhesion, right eye: Secondary | ICD-10-CM | POA: Diagnosis not present

## 2017-08-07 DIAGNOSIS — H35371 Puckering of macula, right eye: Secondary | ICD-10-CM | POA: Diagnosis not present

## 2017-08-07 DIAGNOSIS — H34811 Central retinal vein occlusion, right eye, with macular edema: Secondary | ICD-10-CM | POA: Diagnosis not present

## 2017-08-12 DIAGNOSIS — I10 Essential (primary) hypertension: Secondary | ICD-10-CM | POA: Diagnosis not present

## 2017-08-12 DIAGNOSIS — H8143 Vertigo of central origin, bilateral: Secondary | ICD-10-CM | POA: Diagnosis not present

## 2017-08-12 DIAGNOSIS — M810 Age-related osteoporosis without current pathological fracture: Secondary | ICD-10-CM | POA: Diagnosis not present

## 2017-08-15 DIAGNOSIS — I998 Other disorder of circulatory system: Secondary | ICD-10-CM | POA: Diagnosis not present

## 2017-08-15 DIAGNOSIS — R42 Dizziness and giddiness: Secondary | ICD-10-CM | POA: Diagnosis not present

## 2017-08-27 DIAGNOSIS — R252 Cramp and spasm: Secondary | ICD-10-CM | POA: Diagnosis not present

## 2017-08-27 DIAGNOSIS — H8143 Vertigo of central origin, bilateral: Secondary | ICD-10-CM | POA: Diagnosis not present

## 2017-08-30 DIAGNOSIS — Z9049 Acquired absence of other specified parts of digestive tract: Secondary | ICD-10-CM | POA: Diagnosis not present

## 2017-08-30 DIAGNOSIS — K862 Cyst of pancreas: Secondary | ICD-10-CM | POA: Diagnosis not present

## 2017-08-30 DIAGNOSIS — K228 Other specified diseases of esophagus: Secondary | ICD-10-CM | POA: Diagnosis not present

## 2017-08-30 DIAGNOSIS — K449 Diaphragmatic hernia without obstruction or gangrene: Secondary | ICD-10-CM | POA: Diagnosis not present

## 2017-08-30 DIAGNOSIS — R509 Fever, unspecified: Secondary | ICD-10-CM | POA: Diagnosis not present

## 2017-08-31 DIAGNOSIS — K449 Diaphragmatic hernia without obstruction or gangrene: Secondary | ICD-10-CM | POA: Diagnosis not present

## 2017-08-31 DIAGNOSIS — R509 Fever, unspecified: Secondary | ICD-10-CM | POA: Diagnosis not present

## 2017-08-31 DIAGNOSIS — K862 Cyst of pancreas: Secondary | ICD-10-CM | POA: Diagnosis not present

## 2017-09-01 DIAGNOSIS — M1009 Idiopathic gout, multiple sites: Secondary | ICD-10-CM | POA: Diagnosis not present

## 2017-09-01 DIAGNOSIS — Z7982 Long term (current) use of aspirin: Secondary | ICD-10-CM | POA: Diagnosis not present

## 2017-09-01 DIAGNOSIS — Z8507 Personal history of malignant neoplasm of pancreas: Secondary | ICD-10-CM | POA: Diagnosis not present

## 2017-09-01 DIAGNOSIS — K529 Noninfective gastroenteritis and colitis, unspecified: Secondary | ICD-10-CM | POA: Diagnosis not present

## 2017-09-01 DIAGNOSIS — Z79899 Other long term (current) drug therapy: Secondary | ICD-10-CM | POA: Diagnosis not present

## 2017-09-01 DIAGNOSIS — M109 Gout, unspecified: Secondary | ICD-10-CM | POA: Diagnosis not present

## 2017-09-01 DIAGNOSIS — K5289 Other specified noninfective gastroenteritis and colitis: Secondary | ICD-10-CM | POA: Diagnosis not present

## 2017-09-01 DIAGNOSIS — I10 Essential (primary) hypertension: Secondary | ICD-10-CM | POA: Diagnosis not present

## 2017-09-01 DIAGNOSIS — Z833 Family history of diabetes mellitus: Secondary | ICD-10-CM | POA: Diagnosis not present

## 2017-09-01 DIAGNOSIS — Z808 Family history of malignant neoplasm of other organs or systems: Secondary | ICD-10-CM | POA: Diagnosis not present

## 2017-09-01 DIAGNOSIS — Z8249 Family history of ischemic heart disease and other diseases of the circulatory system: Secondary | ICD-10-CM | POA: Diagnosis not present

## 2017-09-01 DIAGNOSIS — Z883 Allergy status to other anti-infective agents status: Secondary | ICD-10-CM | POA: Diagnosis not present

## 2017-09-01 DIAGNOSIS — Z886 Allergy status to analgesic agent status: Secondary | ICD-10-CM | POA: Diagnosis not present

## 2017-09-01 DIAGNOSIS — N400 Enlarged prostate without lower urinary tract symptoms: Secondary | ICD-10-CM | POA: Diagnosis not present

## 2017-09-01 DIAGNOSIS — K861 Other chronic pancreatitis: Secondary | ICD-10-CM | POA: Diagnosis not present

## 2017-09-02 DIAGNOSIS — M1009 Idiopathic gout, multiple sites: Secondary | ICD-10-CM | POA: Diagnosis not present

## 2017-09-02 DIAGNOSIS — K861 Other chronic pancreatitis: Secondary | ICD-10-CM | POA: Diagnosis not present

## 2017-09-02 DIAGNOSIS — Z8507 Personal history of malignant neoplasm of pancreas: Secondary | ICD-10-CM | POA: Diagnosis not present

## 2017-09-02 DIAGNOSIS — N400 Enlarged prostate without lower urinary tract symptoms: Secondary | ICD-10-CM | POA: Diagnosis not present

## 2017-09-02 DIAGNOSIS — K529 Noninfective gastroenteritis and colitis, unspecified: Secondary | ICD-10-CM | POA: Diagnosis not present

## 2017-09-02 DIAGNOSIS — I10 Essential (primary) hypertension: Secondary | ICD-10-CM | POA: Diagnosis not present

## 2017-09-02 DIAGNOSIS — K5289 Other specified noninfective gastroenteritis and colitis: Secondary | ICD-10-CM | POA: Diagnosis not present

## 2017-09-02 DIAGNOSIS — M109 Gout, unspecified: Secondary | ICD-10-CM | POA: Diagnosis not present

## 2017-09-03 DIAGNOSIS — K861 Other chronic pancreatitis: Secondary | ICD-10-CM | POA: Diagnosis not present

## 2017-09-03 DIAGNOSIS — I10 Essential (primary) hypertension: Secondary | ICD-10-CM | POA: Diagnosis not present

## 2017-09-03 DIAGNOSIS — K529 Noninfective gastroenteritis and colitis, unspecified: Secondary | ICD-10-CM | POA: Diagnosis not present

## 2017-09-03 DIAGNOSIS — Z8507 Personal history of malignant neoplasm of pancreas: Secondary | ICD-10-CM | POA: Diagnosis not present

## 2017-09-03 DIAGNOSIS — M109 Gout, unspecified: Secondary | ICD-10-CM | POA: Diagnosis not present

## 2017-09-03 DIAGNOSIS — M1009 Idiopathic gout, multiple sites: Secondary | ICD-10-CM | POA: Diagnosis not present

## 2017-09-03 DIAGNOSIS — K5289 Other specified noninfective gastroenteritis and colitis: Secondary | ICD-10-CM | POA: Diagnosis not present

## 2017-09-03 DIAGNOSIS — N400 Enlarged prostate without lower urinary tract symptoms: Secondary | ICD-10-CM | POA: Diagnosis not present

## 2017-09-11 DIAGNOSIS — K5289 Other specified noninfective gastroenteritis and colitis: Secondary | ICD-10-CM | POA: Diagnosis not present

## 2017-09-19 DIAGNOSIS — H43821 Vitreomacular adhesion, right eye: Secondary | ICD-10-CM | POA: Diagnosis not present

## 2017-09-19 DIAGNOSIS — H35371 Puckering of macula, right eye: Secondary | ICD-10-CM | POA: Diagnosis not present

## 2017-09-19 DIAGNOSIS — H3561 Retinal hemorrhage, right eye: Secondary | ICD-10-CM | POA: Diagnosis not present

## 2017-09-19 DIAGNOSIS — H34811 Central retinal vein occlusion, right eye, with macular edema: Secondary | ICD-10-CM | POA: Diagnosis not present

## 2017-09-30 DIAGNOSIS — H8110 Benign paroxysmal vertigo, unspecified ear: Secondary | ICD-10-CM | POA: Diagnosis not present

## 2017-10-23 DIAGNOSIS — H35371 Puckering of macula, right eye: Secondary | ICD-10-CM | POA: Diagnosis not present

## 2017-10-23 DIAGNOSIS — H43821 Vitreomacular adhesion, right eye: Secondary | ICD-10-CM | POA: Diagnosis not present

## 2017-10-23 DIAGNOSIS — H43813 Vitreous degeneration, bilateral: Secondary | ICD-10-CM | POA: Diagnosis not present

## 2017-10-23 DIAGNOSIS — H34811 Central retinal vein occlusion, right eye, with macular edema: Secondary | ICD-10-CM | POA: Diagnosis not present

## 2017-10-23 DIAGNOSIS — H40023 Open angle with borderline findings, high risk, bilateral: Secondary | ICD-10-CM | POA: Diagnosis not present

## 2017-10-29 ENCOUNTER — Other Ambulatory Visit: Payer: Medicare Other

## 2017-11-05 DIAGNOSIS — Z23 Encounter for immunization: Secondary | ICD-10-CM | POA: Diagnosis not present

## 2017-11-06 DIAGNOSIS — E291 Testicular hypofunction: Secondary | ICD-10-CM | POA: Diagnosis not present

## 2017-12-10 DIAGNOSIS — H25812 Combined forms of age-related cataract, left eye: Secondary | ICD-10-CM | POA: Diagnosis not present

## 2017-12-19 DIAGNOSIS — H34811 Central retinal vein occlusion, right eye, with macular edema: Secondary | ICD-10-CM | POA: Diagnosis not present

## 2018-01-14 DIAGNOSIS — T1502XA Foreign body in cornea, left eye, initial encounter: Secondary | ICD-10-CM | POA: Diagnosis not present

## 2018-01-17 ENCOUNTER — Emergency Department: Admit: 2018-01-17 | Payer: MEDICARE | Primary: Family Medicine

## 2018-01-17 ENCOUNTER — Inpatient Hospital Stay
Admit: 2018-01-17 | Discharge: 2018-01-21 | Disposition: A | Discharge status: Home / Self Care | Payer: MEDICARE | Attending: Internal Medicine | Admitting: Internal Medicine

## 2018-01-17 DIAGNOSIS — A419 Sepsis, unspecified organism: Secondary | ICD-10-CM

## 2018-01-17 DIAGNOSIS — Z79899 Other long term (current) drug therapy: Secondary | ICD-10-CM | POA: Diagnosis not present

## 2018-01-17 DIAGNOSIS — N179 Acute kidney failure, unspecified: Secondary | ICD-10-CM | POA: Diagnosis present

## 2018-01-17 DIAGNOSIS — R7989 Other specified abnormal findings of blood chemistry: Secondary | ICD-10-CM | POA: Diagnosis not present

## 2018-01-17 DIAGNOSIS — D649 Anemia, unspecified: Secondary | ICD-10-CM | POA: Diagnosis present

## 2018-01-17 DIAGNOSIS — R740 Nonspecific elevation of levels of transaminase and lactic acid dehydrogenase [LDH]: Secondary | ICD-10-CM | POA: Diagnosis present

## 2018-01-17 DIAGNOSIS — E872 Acidosis: Secondary | ICD-10-CM | POA: Diagnosis present

## 2018-01-17 DIAGNOSIS — I340 Nonrheumatic mitral (valve) insufficiency: Secondary | ICD-10-CM | POA: Diagnosis not present

## 2018-01-17 DIAGNOSIS — I10 Essential (primary) hypertension: Secondary | ICD-10-CM | POA: Diagnosis present

## 2018-01-17 DIAGNOSIS — R945 Abnormal results of liver function studies: Secondary | ICD-10-CM | POA: Diagnosis not present

## 2018-01-17 DIAGNOSIS — Z8507 Personal history of malignant neoplasm of pancreas: Secondary | ICD-10-CM | POA: Diagnosis not present

## 2018-01-17 DIAGNOSIS — R509 Fever, unspecified: Secondary | ICD-10-CM | POA: Diagnosis not present

## 2018-01-17 DIAGNOSIS — R079 Chest pain, unspecified: Secondary | ICD-10-CM | POA: Diagnosis not present

## 2018-01-17 DIAGNOSIS — Z9889 Other specified postprocedural states: Secondary | ICD-10-CM | POA: Diagnosis not present

## 2018-01-17 DIAGNOSIS — I4510 Unspecified right bundle-branch block: Secondary | ICD-10-CM | POA: Diagnosis not present

## 2018-01-17 DIAGNOSIS — Z951 Presence of aortocoronary bypass graft: Secondary | ICD-10-CM | POA: Diagnosis not present

## 2018-01-17 DIAGNOSIS — Z9049 Acquired absence of other specified parts of digestive tract: Secondary | ICD-10-CM | POA: Diagnosis not present

## 2018-01-17 DIAGNOSIS — Z9221 Personal history of antineoplastic chemotherapy: Secondary | ICD-10-CM | POA: Diagnosis not present

## 2018-01-17 DIAGNOSIS — Z87442 Personal history of urinary calculi: Secondary | ICD-10-CM | POA: Diagnosis not present

## 2018-01-17 DIAGNOSIS — R739 Hyperglycemia, unspecified: Secondary | ICD-10-CM | POA: Diagnosis present

## 2018-01-17 DIAGNOSIS — I25810 Atherosclerosis of coronary artery bypass graft(s) without angina pectoris: Secondary | ICD-10-CM | POA: Diagnosis not present

## 2018-01-17 DIAGNOSIS — R011 Cardiac murmur, unspecified: Secondary | ICD-10-CM | POA: Diagnosis not present

## 2018-01-17 DIAGNOSIS — I2510 Atherosclerotic heart disease of native coronary artery without angina pectoris: Secondary | ICD-10-CM | POA: Diagnosis present

## 2018-01-17 DIAGNOSIS — N400 Enlarged prostate without lower urinary tract symptoms: Secondary | ICD-10-CM | POA: Diagnosis present

## 2018-01-17 DIAGNOSIS — I350 Nonrheumatic aortic (valve) stenosis: Secondary | ICD-10-CM | POA: Diagnosis not present

## 2018-01-17 DIAGNOSIS — C259 Malignant neoplasm of pancreas, unspecified: Secondary | ICD-10-CM | POA: Diagnosis not present

## 2018-01-17 DIAGNOSIS — Z923 Personal history of irradiation: Secondary | ICD-10-CM | POA: Diagnosis not present

## 2018-01-17 DIAGNOSIS — Z90411 Acquired partial absence of pancreas: Secondary | ICD-10-CM | POA: Diagnosis not present

## 2018-01-17 LAB — METABOLIC PANEL, COMPREHENSIVE
ALT (SGPT): 42 U/L (ref 12–78)
AST (SGOT): 39 U/L — ABNORMAL HIGH (ref 15–37)
Albumin: 3.8 gm/dl (ref 3.4–5.0)
Alk. phosphatase: 141 U/L — ABNORMAL HIGH (ref 45–117)
Anion gap: 9 mmol/L (ref 5–15)
BUN: 26 mg/dl — ABNORMAL HIGH (ref 7–25)
Bilirubin, total: 0.7 mg/dl (ref 0.2–1.0)
CO2: 20 mEq/L — ABNORMAL LOW (ref 21–32)
Calcium: 9 mg/dl (ref 8.5–10.1)
Chloride: 111 mEq/L — ABNORMAL HIGH (ref 98–107)
Creatinine: 1.5 mg/dl — ABNORMAL HIGH (ref 0.6–1.3)
GFR est AA: 59
GFR est non-AA: 49
Glucose: 155 mg/dl — ABNORMAL HIGH (ref 74–106)
Potassium: 4.1 mEq/L (ref 3.5–5.1)
Protein, total: 8.1 gm/dl (ref 6.4–8.2)
Sodium: 140 mEq/L (ref 136–145)

## 2018-01-17 LAB — CBC WITH AUTOMATED DIFF
BASOPHILS: 0.1 % (ref 0–3)
EOSINOPHILS: 0.4 % (ref 0–5)
HCT: 32.5 % — ABNORMAL LOW (ref 37.0–50.0)
HGB: 10.1 gm/dl — ABNORMAL LOW (ref 12.4–17.2)
IMMATURE GRANULOCYTES: 0.3 % (ref 0.0–3.0)
LYMPHOCYTES: 6.8 % — ABNORMAL LOW (ref 28–48)
MCH: 25.7 pg (ref 23.0–34.6)
MCHC: 31.1 gm/dl (ref 30.0–36.0)
MCV: 82.7 fL (ref 80.0–98.0)
MONOCYTES: 9 % (ref 1–13)
MPV: 12.3 fL — ABNORMAL HIGH (ref 6.0–10.0)
NEUTROPHILS: 83.4 % — ABNORMAL HIGH (ref 34–64)
NRBC: 0 (ref 0–0)
PLATELET: 141 10*3/uL (ref 140–450)
RBC: 3.93 M/uL (ref 3.80–5.70)
RDW-SD: 58 — ABNORMAL HIGH (ref 35.1–43.9)
WBC: 7.4 10*3/uL (ref 4.0–11.0)

## 2018-01-17 LAB — URINALYSIS W/ RFLX MICROSCOPIC
Bilirubin, Urine: NEGATIVE
Bilirubin: NEGATIVE
Blood, Urine: NEGATIVE
Blood: NEGATIVE
Glucose, Ur: NEGATIVE mg/dl
Glucose: NEGATIVE mg/dl
Ketone: NEGATIVE mg/dl
Ketones, Urine: NEGATIVE mg/dl
Leukocyte Esterase, Urine: NEGATIVE
Leukocyte Esterase: NEGATIVE
Nitrite, Urine: NEGATIVE
Nitrites: NEGATIVE
Protein, UA: NEGATIVE mg/dl
Protein: NEGATIVE mg/dl
Specific Gravity, UA: 1.02 (ref 1.005–1.030)
Specific gravity: 1.02 (ref 1.005–1.030)
Urobilinogen, UA, POCT: 0.2 mg/dl (ref 0.0–1.0)
Urobilinogen: 0.2 mg/dl (ref 0.0–1.0)
pH (UA): 5.5 (ref 5.0–9.0)
pH, UA: 5.5 (ref 5.0–9.0)

## 2018-01-17 LAB — POC URINE MACROSCOPIC
Blood, Urine: NEGATIVE
Blood: NEGATIVE
Glucose, Ur: NEGATIVE mg/dl
Glucose: NEGATIVE mg/dl
Ketone: NEGATIVE mg/dl
Ketones, Urine: NEGATIVE mg/dl
Leukocyte Esterase, Urine: NEGATIVE
Leukocyte Esterase: NEGATIVE
Nitrite, Urine: NEGATIVE
Nitrites: NEGATIVE
Protein, UA: NEGATIVE mg/dl
Protein: NEGATIVE mg/dl
Specific Gravity, UA: 1.02 (ref 1.005–1.030)
Specific gravity: 1.02 (ref 1.005–1.030)
Urobilinogen, UA, POCT: 0.2 EU/dl (ref 0.0–1.0)
Urobilinogen: 0.2 EU/dl (ref 0.0–1.0)
pH (UA): 5.5 (ref 5–9)
pH, UA: 5.5 (ref 5–9)

## 2018-01-17 LAB — PROCALCITONIN
PROCALCITONIN: 0.42 ng/ml (ref 0.00–0.50)
PROCALCITONIN: 0.42 ng/ml (ref 0.00–0.50)

## 2018-01-17 LAB — LACTIC ACID
LACTIC ACID: 2 mmol/L (ref 0.4–2.0)
Lactic Acid: 2 mmol/L (ref 0.4–2.0)

## 2018-01-17 LAB — CBC WITH AUTO DIFFERENTIAL
Basophils %: 0.1 % (ref 0–3)
Eosinophils %: 0.4 % (ref 0–5)
Hematocrit: 32.5 % — ABNORMAL LOW (ref 37.0–50.0)
Hemoglobin: 10.1 gm/dl — ABNORMAL LOW (ref 12.4–17.2)
Immature Granulocytes: 0.3 % (ref 0.0–3.0)
Lymphocytes %: 6.8 % — ABNORMAL LOW (ref 28–48)
MCH: 25.7 pg (ref 23.0–34.6)
MCHC: 31.1 gm/dl (ref 30.0–36.0)
MCV: 82.7 fL (ref 80.0–98.0)
MPV: 12.3 fL — ABNORMAL HIGH (ref 6.0–10.0)
Monocytes %: 9 % (ref 1–13)
Neutrophils %: 83.4 % — ABNORMAL HIGH (ref 34–64)
Nucleated RBCs: 0 (ref 0–0)
Platelets: 141 10*3/uL (ref 140–450)
RBC: 3.93 M/uL (ref 3.80–5.70)
RDW-SD: 58 — ABNORMAL HIGH (ref 35.1–43.9)
WBC: 7.4 10*3/uL (ref 4.0–11.0)

## 2018-01-17 LAB — COMPREHENSIVE METABOLIC PANEL
ALT: 42 U/L (ref 12–78)
AST: 39 U/L — ABNORMAL HIGH (ref 15–37)
Albumin: 3.8 gm/dl (ref 3.4–5.0)
Alkaline Phosphatase: 141 U/L — ABNORMAL HIGH (ref 45–117)
Anion Gap: 9 mmol/L (ref 5–15)
BUN: 26 mg/dl — ABNORMAL HIGH (ref 7–25)
CO2: 20 mEq/L — ABNORMAL LOW (ref 21–32)
Calcium: 9 mg/dl (ref 8.5–10.1)
Chloride: 111 mEq/L — ABNORMAL HIGH (ref 98–107)
Creatinine: 1.5 mg/dl — ABNORMAL HIGH (ref 0.6–1.3)
EGFR IF NonAfrican American: 49
GFR African American: 59
Glucose: 155 mg/dl — ABNORMAL HIGH (ref 74–106)
Potassium: 4.1 mEq/L (ref 3.5–5.1)
Sodium: 140 mEq/L (ref 136–145)
Total Bilirubin: 0.7 mg/dl (ref 0.2–1.0)
Total Protein: 8.1 gm/dl (ref 6.4–8.2)

## 2018-01-17 MED ORDER — SODIUM CHLORIDE 0.9% BOLUS IV
0.9 % | Freq: Once | INTRAVENOUS | Status: AC
Start: 2018-01-17 — End: 2018-01-17
  Administered 2018-01-17: 23:00:00 via INTRAVENOUS

## 2018-01-17 MED ORDER — PHARMACY INFORMATION NOTE
Status: DC
Start: 2018-01-17 — End: 2018-01-17
  Administered 2018-01-17: 22:00:00

## 2018-01-17 MED ORDER — PIPERACILLIN-TAZOBACTAM 4.5 GRAM IV SOLR
4.5 gram | INTRAVENOUS | Status: AC
Start: 2018-01-17 — End: 2018-01-17
  Administered 2018-01-17: 23:00:00 via INTRAVENOUS

## 2018-01-17 MED FILL — PIPERACILLIN-TAZOBACTAM 4.5 GRAM IV SOLR: 4.5 gram | INTRAVENOUS | Qty: 4.5

## 2018-01-17 MED FILL — PHARMACY INFORMATION NOTE: Qty: 1

## 2018-01-17 NOTE — H&P (Addendum)
Hospitalist Admission History and Physical    NAME:  Tommy CAMBRIDGE Sr.   DOB:   1945/05/11   MRN:   [id number]     PCP:  None  Admission Date/Time:  01/17/2018 8:56 PM  Anticipated Date of Discharge: 01/20/18  Anticipated Disposition (home, SNF) : HH         Assessment/Plan:      Active Problems:    Sepsis ([place]) (01/17/2018)    Sepsis likely from intra-abdominal source  Acute kidney injury  Hyperglycemia  Metabolic acidosis  Elevated LFT  Coronary artery disease status post CABG  Pancreatic cancer status post Whipple surgery    ___________________________________________________  PLAN:    Admit to telemetry  Cultures x2  IV fluids  Zosyn IV  Gastroenterology consult, clear liquids for the time being  MRI of the abdomen as discussed with gastroenterology  Considering recurrent infections 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 Edd Arbour, as patient and patient family requested an oncologist in the area who can follow-up as he will not be going back to [place] with him relieving before  Check INR    Risk of deterioration:  '[]' Low    '[x]' Moderate  '[]' High    Prophylaxis:  '[]' Lovenox  '[]' Coumadin  '[x]' Hep SQ  '[]' SCD???s  '[]' H2B/PPI    Disposition:  '[]' Home w/ Family   '[x]' HH PT,OT,RN   '[]' SNF/LTC   '[]' SAH/Rehab    Full code as discussed with patient and patient's power of attorney wife Tommy Brown   . Phone number for the power of attorney and the medical decision maker [phone] . Discussed nature of interventions like cardiopulmonary resuscitation/ventilatory support and patient's power of attorney clearly voices that she  would want all life prolonging measures or aggressive interventions as needed to be alive. Time spent exclusively in advance care planning is 17 minutes     Brief medical history   Patient had pancreatic cancer at the age of 58 years, had Whipple's done in 2006, surgery was done at [facility] in [place], [place] followed this with chemoradiation which she did not finish and could not tolerate and took only half the dose and stopped last time which she received was 7 years ago  He follows with his oncologist once a year who does CT scans and blood work and last CT scan was done in May 2019 which he was reported to be negative  He also follows up with his family doctor Dr.'s Deforest Hoyles, who has researched and spoken with immunologist who he has seen and told that people who have Whipple surgery done they have high likelihood of having recurrent infections in the near the site of surgery in the old pocket and will need to be treated with IV antibiotics all his life every few months      Subjective:   CHIEF COMPLAINT:    Chief Complaint   Patient presents with   ??? Fever       HISTORY OF PRESENT ILLNESS:     Tommy Brown is a 72 y.o. WHITE OR CAUCASIAN male who presents with fever, chills generalized myalgias and body aches  Patient has history of chronic cancer status post Whipple surgery more than 7 years ago and has history of recurrent episodes of the above symptoms every 2 months for which he is to be admitted to the  hospital and receive IV antibiotics  He has been seen by his oncologist, primary care physician who has spoken with his immunologist and has told him that he has a small microperforation at the site of his surgery and has infection with needs to be treated  He used to live in [place], [place] and is now moved to this area since August to be close to the daughter in [place] and is trying to establish care in this area      Past Medical History:   Diagnosis Date   ??? Hx of CABG    ??? Kidney stones    ??? Pancreatic cancer ([place])    Coronary artery disease    Past Surgical History:   Procedure Laterality Date   ??? HX ORTHOPAEDIC      back    Carotid surgery, coronary artery bypass grafting, multiple pneumonias    Social History     Tobacco Use   ??? Smoking status: Never Smoker   ??? Smokeless tobacco: Never Used   Substance Use Topics   ??? Alcohol use: Not on file      Family history  Brother had pancreatic cancer in his 43s, one older brother had pancreatic cancer at the age of 21    No Known Allergies     Prior to Admission Medications   Prescriptions Last Dose Informant Patient Reported? Taking?   TESTOSTERONE IM   Yes Yes   Sig: by IntraMUSCular route.   allopurinol (ZYLOPRIM) 300 mg tablet   Yes Yes   Sig: Take  by mouth daily.   amLODIPine (NORVASC) 5 mg tablet   Yes Yes   Sig: Take 5 mg by mouth daily.   aspirin 81 mg chewable tablet   Yes Yes   Sig: Take 81 mg by mouth daily.   fluticasone propionate 100 mcg/actuation dsdv   Yes Yes   Sig: Take  by inhalation.   gabapentin (NEURONTIN) 600 mg tablet   Yes Yes   Sig: Take 600 mg by mouth two (2) times a day.   lipase-protease-amylase (ZENPEP) 20,000-63,000- 84,000 unit capsule   Yes Yes   Sig: Take 8 Caps by mouth daily.   metoprolol tartrate (LOPRESSOR) 25 mg tablet   Yes Yes   Sig: Take 25 mg by mouth daily.   tamsulosin (FLOMAX) 0.4 mg capsule   Yes Yes   Sig: Take 0.4 mg by mouth daily.   tiZANidine (ZANAFLEX) 4 mg tablet   Yes Yes   Sig: Take 4 mg by mouth nightly.      Facility-Administered Medications: None           REVIEW OF SYSTEMS:     '[]'  Unable to obtain  ROS due to  '[]' mental status change  '[]' sedated   '[]' intubated   '[x]' Total of 12 systems reviewed as follows:  Constitutional: Positive for fever and chills  Eyes:   negative visual changes  ENT:   negative sore throat, tongue or lip swelling  Respiratory:  negative cough, negative dyspnea  Cards:  negative for chest pain, palpitations, lower extremity edema  GI:   negative for nausea, vomiting, diarrhea, and abdominal pain  Genitourinary: negative for frequency, dysuria  Integument:  negative for rash and pruritus   Hematologic:  negative for easy bruising and gum/nose bleeding  Musculoskel: Positive for myalgias  Neurological:  negative for headaches, dizziness, vertigo  Behavl/Psych: negative for feelings of anxiety, depression       Objective:   VITALS:  Visit Vitals  BP 129/64 (BP 1 Location: Left arm, BP Patient Position: Supine)   Pulse 72   Temp 99.4 ??F (37.4 ??C)   Resp 16   Ht '5\' 8"'  (1.727 m)   Wt 63.5 kg (140 lb)   SpO2 100%   BMI 21.29 kg/m??     Temp (24hrs), Avg:100 ??F (37.8 ??C), Min:99.4 ??F (37.4 ??C), Max:100.6 ??F (38.1 ??C)      PHYSICAL EXAM:   General:    Alert, cooperative, no distress, appears stated age.     Head:   Normocephalic, without obvious abnormality, atraumatic.  Eyes:   Conjunctivae clear, anicteric sclerae.  Pupils are equal  Nose:  Nares normal. No drainage or sinus tenderness.  Throat:    Lips, mucosa, and tongue normal.  No Thrush  Neck:  Supple, symmetrical,  no adenopathy, thyroid: non tender    no carotid bruit and no JVD.  Back:    Symmetric,  No CVA tenderness.  Lungs:   Clear to auscultation bilaterally.  No Wheezing or Rhonchi. No rales.  Chest wall:  No tenderness or deformity. No Accessory muscle use.  Heart:   Regular rate and rhythm,  no murmur, rub or gallop.  Abdomen:   Soft, non-tender. Not distended.  Bowel sounds normal. No masses  Extremities: Extremities normal, atraumatic, No cyanosis.  No edema. No clubbing  Skin:     Texture, turgor normal. No rashes or lesions.  Not Jaundiced  Psych:  Good insight.  Not depressed.  Not anxious or agitated.  Neurologic: EOMs intact. No facial asymmetry. No aphasia or slurred speech. Normal  strength, Alert and oriented X 3.       LAB DATA REVIEWED:    Recent Results (from the past 12 hour(s))   CBC WITH AUTOMATED DIFF    Collection Time: 01/17/18  4:34 PM   Result Value Ref Range    WBC 7.4 4.0 - 11.0 1000/mm3    RBC 3.93 3.80 - 5.70 M/uL    HGB 10.1 (L) 12.4 - 17.2 gm/dl    HCT 32.5 (L) 37.0 - 50.0 %    MCV 82.7 80.0 - 98.0 fL     MCH 25.7 23.0 - 34.6 pg    MCHC 31.1 30.0 - 36.0 gm/dl    PLATELET 141 140 - 450 1000/mm3    MPV 12.3 (H) 6.0 - 10.0 fL    RDW-SD 58.0 (H) 35.1 - 43.9      NRBC 0 0 - 0      IMMATURE GRANULOCYTES 0.3 0.0 - 3.0 %    NEUTROPHILS 83.4 (H) 34 - 64 %    LYMPHOCYTES 6.8 (L) 28 - 48 %    MONOCYTES 9.0 1 - 13 %    EOSINOPHILS 0.4 0 - 5 %    BASOPHILS 0.1 0 - 3 %   METABOLIC PANEL, COMPREHENSIVE    Collection Time: 01/17/18  4:34 PM   Result Value Ref Range    Sodium 140 136 - 145 mEq/L    Potassium 4.1 3.5 - 5.1 mEq/L    Chloride 111 (H) 98 - 107 mEq/L    CO2 20 (L) 21 - 32 mEq/L    Glucose 155 (H) 74 - 106 mg/dl    BUN 26 (H) 7 - 25 mg/dl    Creatinine 1.5 (H) 0.6 - 1.3 mg/dl    GFR est AA 59.0      GFR est non-AA 49      Calcium 9.0 8.5 - 10.1  mg/dl    AST (SGOT) 39 (H) 15 - 37 U/L    ALT (SGPT) 42 12 - 78 U/L    Alk. phosphatase 141 (H) 45 - 117 U/L    Bilirubin, total 0.7 0.2 - 1.0 mg/dl    Protein, total 8.1 6.4 - 8.2 gm/dl    Albumin 3.8 3.4 - 5.0 gm/dl    Anion gap 9 5 - 15 mmol/L   LACTIC ACID    Collection Time: 01/17/18  4:34 PM   Result Value Ref Range    Lactic Acid 2.0 0.4 - 2.0 mmol/L   PROCALCITONIN    Collection Time: 01/17/18  4:34 PM   Result Value Ref Range    PROCALCITONIN 0.42 0.00 - 0.50 ng/ml   URINALYSIS W/ RFLX MICROSCOPIC    Collection Time: 01/17/18  5:02 PM   Result Value Ref Range    Color YELLOW YELLOW,STRAW      Appearance CLEAR CLEAR      Glucose NEGATIVE NEGATIVE,Negative mg/dl    Bilirubin NEGATIVE NEGATIVE,Negative      Ketone NEGATIVE NEGATIVE,Negative mg/dl    Specific gravity 1.020 1.005 - 1.030      Blood NEGATIVE NEGATIVE,Negative      pH (UA) 5.5 5.0 - 9.0      Protein NEGATIVE NEGATIVE,Negative mg/dl    Urobilinogen 0.2 0.0 - 1.0 mg/dl    Nitrites NEGATIVE NEGATIVE,Negative      Leukocyte Esterase NEGATIVE NEGATIVE,Negative     POC URINE MACROSCOPIC    Collection Time: 01/17/18  5:05 PM   Result Value Ref Range    Glucose Negative NEGATIVE,Negative mg/dl     Bilirubin Small (A) NEGATIVE,Negative      Ketone Negative NEGATIVE,Negative mg/dl    Specific gravity 1.020 1.005 - 1.030      Blood Negative NEGATIVE,Negative      pH (UA) 5.5 5 - 9      Protein Negative NEGATIVE,Negative mg/dl    Urobilinogen 0.2 0.0 - 1.0 EU/dl    Nitrites Negative NEGATIVE,Negative      Leukocyte Esterase Negative NEGATIVE,Negative      Color Yellow      Appearance Clear           IMAGING RESULTS:    Xr Chest Sngl V    Result Date: 01/17/2018  EXAM:  Chest AP INDICATIONS: sepsis COMPARISON: None. FINDINGS: No consolidation, pleural effusion, or pulmonary edema. No pneumothorax. Normal cardiomediastinal contours. Median sternotomy wires and changes post-CABG.     IMPRESSION: No acute cardiopulmonary process.     Care Plan discussed with:   '[x]' Patient   '[x]' Family    '[]' ED Care Manager  '[]' ED Doc   '[]' Specialist :    Total care time spent on reviewing the case/data/notes/EMR,examining the patient,documentation,coordinating care with nurses and consultants is 40  minutes.       ___________________________________________________  Admitting Physician: Charlaine Dalton, MD     Dragon medical dictation software was used for portions of this report.  Unintended voice transcription errors may have occurred.

## 2018-01-17 NOTE — ED Notes (Signed)
TRANSFER - OUT REPORT:    Verbal report given to [place] RN(name) on Tommy F Gammon Sr.  being transferred to [address] (unit) for routine progression of care       Report consisted of patient???s Situation, Background, Assessment and   Recommendations(SBAR).     Information from the following report(s) ED Summary was reviewed with the receiving nurse.    Lines:   Peripheral IV 01/17/18 Right Antecubital (Active)   Site Assessment Clean, dry, & intact 01/17/2018  4:36 PM   Phlebitis Assessment 0 01/17/2018  4:36 PM   Infiltration Assessment 0 01/17/2018  4:36 PM   Dressing Status Clean, dry, & intact 01/17/2018  4:36 PM   Dressing Type Transparent 01/17/2018  4:36 PM   Hub Color/Line Status Flushed 01/17/2018  4:36 PM   Action Taken Blood drawn 01/17/2018  4:36 PM   Alcohol Cap Used Yes 01/17/2018  4:36 PM       Peripheral IV 01/17/18 Left Forearm (Active)   Site Assessment Clean, dry, & intact 01/17/2018  4:42 PM   Phlebitis Assessment 0 01/17/2018  4:42 PM   Infiltration Assessment 0 01/17/2018  4:42 PM   Dressing Status Clean, dry, & intact 01/17/2018  4:42 PM   Dressing Type Transparent 01/17/2018  4:42 PM   Hub Color/Line Status Flushed 01/17/2018  4:42 PM   Action Taken Blood drawn 01/17/2018  4:42 PM   Alcohol Cap Used Yes 01/17/2018  4:42 PM        Opportunity for questions and clarification was provided.      Patient transported with:   [employer]

## 2018-01-17 NOTE — Progress Notes (Signed)
[facility]: Medication History    Medication History Completed Prior to Order Reconciliation?  YES  If "no" and discrepancies were noted please contact attending physician or pharmacist to follow-up: N/A - Medication history was obtained prior to reconciliation.    Information obtained from (list all that apply, 2 sources preferred): Patient and Other: [employer]   If a history was not reviewed directly with patient/caregiver please comment with the reason why: N/A     Antibiotic use in the last 90 days (3 months): NO      Missing Medication Identified  NO  Number of medications: -  Indicate action taken: N/A      Wrong Medication Identified YES   Number of medications: 1  Indicate action taken: Updated Medication List OMEPRAZOLE 40MG        Wrong Dose/Interval/Route Identified YES              Number of medications: 3   Indicate action taken: Updated Medication List GABAPENTIN 600MG , TIZANIDINE 4MG , LOPRESSOR 25MG          Is patient currently taking warfarin:  No        Medication Compliance Issues and/or Medication Concerns: N/A      Allergies: Patient has no known allergies.    Prior to Admission Medications:    Prior to Admission Medications   Prescriptions Last Dose Informant Patient Reported? Taking?   TESTOSTERONE IM   Yes Yes   Sig: by IntraMUSCular route.   allopurinol (ZYLOPRIM) 300 mg tablet   Yes Yes   Sig: Take  by mouth daily.   amLODIPine (NORVASC) 5 mg tablet   Yes Yes   Sig: Take 5 mg by mouth daily.   aspirin 81 mg chewable tablet   Yes Yes   Sig: Take 81 mg by mouth daily.   fluticasone propionate 100 mcg/actuation dsdv   Yes Yes   Sig: Take  by inhalation.   gabapentin (NEURONTIN) 600 mg tablet   Yes Yes   Sig: Take 600 mg by mouth two (2) times a day.   lipase-protease-amylase (ZENPEP) 20,000-63,000- 84,000 unit capsule   Yes Yes   Sig: Take 8 Caps by mouth daily.   metoprolol tartrate (LOPRESSOR) 25 mg tablet   Yes Yes   Sig: Take 25 mg by mouth daily.    tamsulosin (FLOMAX) 0.4 mg capsule   Yes Yes   Sig: Take 0.4 mg by mouth daily.   tiZANidine (ZANAFLEX) 4 mg tablet   Yes Yes   Sig: Take 4 mg by mouth nightly.      Facility-Administered Medications: None         Alen Bleacher [place]   Contact: [phone]

## 2018-01-17 NOTE — ED Provider Notes (Signed)
[facility]  Emergency Department Treatment Report    Patient: Tommy Rutter Sr. Age: 72 y.o. Sex: male    Date of Birth: 10/26/1945 Admit Date: 01/17/2018 PCP: None   MRN: [id number]  CSN: [id number]  Attending: Tawanna Cooler, MD   Room: ER15/ER15 Time Dictated: 7:35 PM        Chief Complaint   Chief Complaint   Patient presents with   ??? 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   HENT: Negative for sore throat.    Eyes: Negative for double vision.   Respiratory: Negative for shortness of breath.    Cardiovascular: Negative for chest pain.   Gastrointestinal: Negative for abdominal pain.   Genitourinary: Negative for dysuria.   Musculoskeletal: Positive for myalgias. Negative for back pain.   Skin: Negative for rash.   Neurological: Negative for headaches.       Past Medical/Surgical History     Past Medical History:   Diagnosis Date   ??? Hx of CABG    ??? Kidney stones    ??? Pancreatic cancer [facility])      Past Surgical History:   Procedure Laterality Date   ??? HX ORTHOPAEDIC      back       Social History     Social History     Socioeconomic History   ??? Marital status: SINGLE     Spouse name: Not on file   ??? Number of children: Not on file   ??? Years of education: Not on file   ??? Highest education level: Not on file   Tobacco Use   ??? Smoking status: Never Smoker   ??? Smokeless tobacco: Never Used   Substance and Sexual Activity   ??? Drug use: Not Currently   ??? Sexual activity: Not Currently        Family History   History reviewed. No pertinent family history.    Current Medications     Prior to Admission Medications   Prescriptions Last Dose Informant Patient Reported? Taking?   TESTOSTERONE IM   Yes Yes   Sig: by IntraMUSCular route.   allopurinol (ZYLOPRIM) 300 mg tablet   Yes Yes   Sig: Take  by mouth daily.   amLODIPine (NORVASC) 5 mg tablet   Yes Yes   Sig: Take 5 mg by mouth daily.   aspirin 81 mg chewable tablet   Yes Yes   Sig: Take 81 mg by mouth daily.   fluticasone propionate 100 mcg/actuation dsdv   Yes Yes   Sig: Take  by inhalation.   gabapentin (NEURONTIN) 300 mg capsule   Yes Yes   Sig: Take 300 mg by mouth three (3) times daily.   lipase-protease-amylase (ZENPEP) 20,000-63,000- 84,000 unit capsule   Yes Yes   Sig: Take  by mouth.   metoprolol tartrate (LOPRESSOR) 25 mg tablet   Yes Yes   Sig: Take 25 mg by mouth two (  2) times a day.   omeprazole (PRILOSEC) 40 mg capsule   Yes Yes   Sig: Take 40 mg by mouth daily.   tamsulosin (FLOMAX) 0.4 mg capsule   Yes Yes   Sig: Take 0.4 mg by mouth daily.   tiZANidine (ZANAFLEX) 4 mg tablet   Yes Yes   Sig: Take 4 mg by mouth three (3) times daily as needed.      Facility-Administered Medications: None       Allergies   No Known Allergies    Physical Exam/ED Course / Medical Decision Making     ED Triage Vitals   Enc Vitals Group      BP 01/17/18 1629 133/63      Pulse (Heart Rate) 01/17/18 1629 86      Resp Rate 01/17/18 1629 17      Temp 01/17/18 1624 (!) 100.6 ??F (38.1 ??C)      Temp src --       O2 Sat (%) 01/17/18 1629 95 %      Weight 01/17/18 1624 140 lb      Height 01/17/18 1624 '5\' 8"'$       Head Circumference --       Peak Flow --       Pain Score --       Pain Loc --       Pain Edu? --       Excl. in [place]? --        Physical exam:  General:  Well-developed, well-nourished, warm to touch nondiaphoretic.    Head:  Normocephalic atraumatic.    Eyes:  Pupils midrange extraocular movements intact.  No pallor or conjunctival injection.     Nose:  No rhinorrhea, inspection grossly normal.    Ears:  Grossly normal to inspection, no discharge.    Mouth:  Mucous membranes moist, no appreciable intraoral lesion.    Neck/Back:  Trachea midline, no asymmetry.    Chest:  Grossly normal inspection, symmetric chest rise.    Pulmonary:  Clear to auscultation bilaterally no wheezes rhonchi or rales.    Cardiovascular:  S1-S2 no murmurs rubs or gallops.    Abdomen: Soft, nontender, nondistended no guarding rebound or peritoneal signs.  No epigastric discomfort  Extremities:  Grossly normal to inspection, peripheral pulses intact    Neurologic:  Alert and oriented no appreciable focal neurologic deficit    Skin:  Warm and dry  Psychiatric:  Grossly normal mood and affect  Nursing note reviewed, vital signs reviewed.    ED course:  Patient presents for evaluation of myalgias, is febrile here not tachycardic.  Recently took Tylenol Toradol and Aleve.  His primary concern is that he has a recurrence of his microperforation that is normally treated conservatively with IV antibiotics.    Initiated sepsis workup, started Zosyn    Unable to find records in EMR, will have nursing staff request medical records from his previous hospital.  At this time he is reporting high risk for deterioration if discharged home we will admit for further management    Labs: White blood cell count is negative, AST 39 alkaline phosphatase 141, he does have an acidosis with a CO2 of 20, BUN is 26 and creatinine 1.5.      Patient's presentation, history, physical exam and laboratory evaluations were reviewed.  I felt the patient would benefit from inpatient management and treatment.     Consult:  Discussed care with Dr. Winona Legato. Standard discussion; including history of patient???s  chief complaint, available diagnostic results, and treatment course.  Patient was accepted to their service.  Requested GI involvement      Consult:  Discussed care with Dr. Eugenia Pancoast. Standard discussion; including history of patient???s chief complaint, available diagnostic results, and treatment course.  Agrees with blood culture, recommends MRCP and will see in hospital        Disposition:    Admitted to medicine service      Portions of this chart were created with Dragon medical speech to text program.   Unrecognized errors may be present.            Final Diagnosis       ICD-10-CM ICD-9-CM   1. Sepsis, due to unspecified organism, unspecified whether acute organ dysfunction present [facility]) A41.9 038.9     995.91         Diagnostic Studies   Lab:   Recent Results (from the past 12 hour(s))   CBC WITH AUTOMATED DIFF    Collection Time: 01/17/18  4:34 PM   Result Value Ref Range    WBC 7.4 4.0 - 11.0 1000/mm3    RBC 3.93 3.80 - 5.70 M/uL    HGB 10.1 (L) 12.4 - 17.2 gm/dl    HCT 32.5 (L) 37.0 - 50.0 %    MCV 82.7 80.0 - 98.0 fL    MCH 25.7 23.0 - 34.6 pg    MCHC 31.1 30.0 - 36.0 gm/dl    PLATELET 141 140 - 450 1000/mm3    MPV 12.3 (H) 6.0 - 10.0 fL    RDW-SD 58.0 (H) 35.1 - 43.9      NRBC 0 0 - 0      IMMATURE GRANULOCYTES 0.3 0.0 - 3.0 %    NEUTROPHILS 83.4 (H) 34 - 64 %    LYMPHOCYTES 6.8 (L) 28 - 48 %    MONOCYTES 9.0 1 - 13 %    EOSINOPHILS 0.4 0 - 5 %    BASOPHILS 0.1 0 - 3 %   METABOLIC PANEL, COMPREHENSIVE    Collection Time: 01/17/18  4:34 PM   Result Value Ref Range    Sodium 140 136 - 145 mEq/L    Potassium 4.1 3.5 - 5.1 mEq/L    Chloride 111 (H) 98 - 107 mEq/L    CO2 20 (L) 21 - 32 mEq/L    Glucose 155 (H) 74 - 106 mg/dl    BUN 26 (H) 7 - 25 mg/dl    Creatinine 1.5 (H) 0.6 - 1.3 mg/dl    GFR est AA 59.0      GFR est non-AA 49      Calcium 9.0 8.5 - 10.1 mg/dl    AST (SGOT) 39 (H) 15 - 37 U/L    ALT (SGPT) 42 12 - 78 U/L    Alk. phosphatase 141 (H) 45 - 117 U/L    Bilirubin, total 0.7 0.2 - 1.0 mg/dl    Protein, total 8.1 6.4 - 8.2 gm/dl    Albumin 3.8 3.4 - 5.0 gm/dl    Anion gap 9 5 - 15 mmol/L   LACTIC ACID     Collection Time: 01/17/18  4:34 PM   Result Value Ref Range    Lactic Acid 2.0 0.4 - 2.0 mmol/L   PROCALCITONIN    Collection Time: 01/17/18  4:34 PM   Result Value Ref Range    PROCALCITONIN 0.42 0.00 - 0.50 ng/ml   URINALYSIS W/ RFLX MICROSCOPIC    Collection Time: 01/17/18  5:02 PM   Result Value Ref Range    Color YELLOW YELLOW,STRAW      Appearance CLEAR CLEAR      Glucose NEGATIVE NEGATIVE,Negative mg/dl    Bilirubin NEGATIVE NEGATIVE,Negative      Ketone NEGATIVE NEGATIVE,Negative mg/dl    Specific gravity 1.020 1.005 - 1.030      Blood NEGATIVE NEGATIVE,Negative      pH (UA) 5.5 5.0 - 9.0      Protein NEGATIVE NEGATIVE,Negative mg/dl    Urobilinogen 0.2 0.0 - 1.0 mg/dl    Nitrites NEGATIVE NEGATIVE,Negative      Leukocyte Esterase NEGATIVE NEGATIVE,Negative     POC URINE MACROSCOPIC    Collection Time: 01/17/18  5:05 PM   Result Value Ref Range    Glucose Negative NEGATIVE,Negative mg/dl    Bilirubin Small (A) NEGATIVE,Negative      Ketone Negative NEGATIVE,Negative mg/dl    Specific gravity 1.020 1.005 - 1.030      Blood Negative NEGATIVE,Negative      pH (UA) 5.5 5 - 9      Protein Negative NEGATIVE,Negative mg/dl    Urobilinogen 0.2 0.0 - 1.0 EU/dl    Nitrites Negative NEGATIVE,Negative      Leukocyte Esterase Negative NEGATIVE,Negative      Color Yellow      Appearance Clear         Imaging:    Xr Chest Sngl V    Result Date: 01/17/2018  EXAM:  Chest AP INDICATIONS: sepsis COMPARISON: None. FINDINGS: No consolidation, pleural effusion, or pulmonary edema. No pneumothorax. Normal cardiomediastinal contours. Median sternotomy wires and changes post-CABG.     IMPRESSION: No acute cardiopulmonary process.                  Medications   piperacillin-tazobactam (ZOSYN) 4.5 g in 0.9% sodium chloride (MBP/ADV) 100 mL MBP (0 g IntraVENous IV Completed 01/17/18 1822)   sodium chloride 0.9 % bolus infusion 1,000 mL (1,000 mL IntraVENous New Bag 01/17/18 1753)     Followed by    sodium chloride 0.9 % bolus infusion 905 mL (905 mL IntraVENous New Bag 01/17/18 1753)          Results of lab/radiology tests were discussed with the patient. . All questions were answered and concerns addressed.        My signature above authenticates this document and my orders, the final    diagnosis (es), discharge prescription (s), and instructions in the Epic record.  If you have any questions please contact [phone].

## 2018-01-17 NOTE — ED Notes (Signed)
Patient c/c of fever. He has a history of pancreatic cancer surgery.  Every 6-8 weeks he needs to be admitted for IV abx.

## 2018-01-17 NOTE — Progress Notes (Signed)
Problem: Pain  Goal: *Control of Pain  Outcome: Progressing Towards Goal     Problem: Falls - Risk of  Goal: *Absence of Falls  Description  Document Tommy Brown Fall Risk and appropriate interventions in the flowsheet.  Outcome: Progressing Towards Goal  Note: Fall Risk Interventions:            Medication Interventions: Bed/chair exit alarm, Patient to call before getting OOB

## 2018-01-17 NOTE — H&P (Signed)
Hospitalist Admission History and Physical    NAME:  Tommy GITTLEMAN Sr.   DOB:   05-04-1945   MRN:   [id number]     PCP:  None  Admission Date/Time:  01/17/2018 8:56 PM  Anticipated Date of Discharge: 01/20/18  Anticipated Disposition (home, SNF) : HH         Assessment/Plan:      Active Problems:    Sepsis ([place]) (01/17/2018)    Sepsis likely from intra-abdominal source  Acute kidney injury  Hyperglycemia  Metabolic acidosis  Elevated LFT  Coronary artery disease status post CABG  Pancreatic cancer status post Whipple surgery    ___________________________________________________  PLAN:    Admit to telemetry  Cultures x2  IV fluids  Zosyn IV  Gastroenterology consult, clear liquids for the time being  MRI of the abdomen as discussed with gastroenterology  Considering recurrent infections 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 Edd Arbour, as patient and patient family requested an oncologist in the area who can follow-up as he will not be going back to [place] with him relieving before  Check INR    Risk of deterioration:  []Low    [x]Moderate  []High    Prophylaxis:  []Lovenox  []Coumadin  [x]Hep SQ  []SCD???s  []H2B/PPI    Disposition:  []Home w/ Family   [x]HH PT,OT,RN   []SNF/LTC   []SAH/Rehab    Full code as discussed with patient and patient's power of attorney wife Tommy Brown   . Phone number for the power of attorney and the medical decision maker [phone] . Discussed nature of interventions like cardiopulmonary resuscitation/ventilatory support and patient's power of attorney clearly voices that she  would want all life prolonging measures or aggressive interventions as needed to be alive. Time spent exclusively in advance care planning is 17 minutes     Brief medical history  Patient had pancreatic cancer at the age of 21 years, had  Whipple's done in 2006, surgery was done at [facility] in [place], Luis Lopez followed this with chemoradiation which she did not finish and could not tolerate and took only half the dose and stopped last time which she received was 7 years ago  He follows with his oncologist once a year who does CT scans and blood work and last CT scan was done in May 2019 which he was reported to be negative  He also follows up with his family doctor Dr.'s Deforest Hoyles, who has researched and spoken with immunologist who he has seen and told that people who have Whipple surgery done they have high likelihood of having recurrent infections in the near the site of surgery in the old pocket and will need to be treated with IV antibiotics all his life every few months      Subjective:   CHIEF COMPLAINT:    Chief Complaint   Patient presents with   ??? Fever       HISTORY OF PRESENT ILLNESS:     Tommy Brown is a 72 y.o. WHITE OR CAUCASIAN male who presents with fever, chills generalized myalgias and body aches  Patient has history of chronic cancer status post Whipple surgery more than 7 years ago and has history of recurrent episodes of the above symptoms every 2 months for which he is to be admitted to the  hospital and receive IV antibiotics  He has been seen by his oncologist, primary care physician who has spoken with his immunologist and has told him that he has a small microperforation at the site of his surgery and has infection with needs to be treated  He used to live in [place], [place] and is now moved to this area since August to be close to the daughter in [place] and is trying to establish care in this area      Past Medical History:   Diagnosis Date   ??? Hx of CABG    ??? Kidney stones    ??? Pancreatic cancer ([place])    Coronary artery disease    Past Surgical History:   Procedure Laterality Date   ??? HX ORTHOPAEDIC      back   Carotid surgery, coronary artery bypass grafting, multiple  pneumonias    Social History     Tobacco Use   ??? Smoking status: Never Smoker   ??? Smokeless tobacco: Never Used   Substance Use Topics   ??? Alcohol use: Not on file      Family history  Brother had pancreatic cancer in his 50s, one older brother had pancreatic cancer at the age of 64    No Known Allergies     Prior to Admission Medications   Prescriptions Last Dose Informant Patient Reported? Taking?   TESTOSTERONE IM   Yes Yes   Sig: by IntraMUSCular route.   allopurinol (ZYLOPRIM) 300 mg tablet   Yes Yes   Sig: Take  by mouth daily.   amLODIPine (NORVASC) 5 mg tablet   Yes Yes   Sig: Take 5 mg by mouth daily.   aspirin 81 mg chewable tablet   Yes Yes   Sig: Take 81 mg by mouth daily.   fluticasone propionate 100 mcg/actuation dsdv   Yes Yes   Sig: Take  by inhalation.   gabapentin (NEURONTIN) 600 mg tablet   Yes Yes   Sig: Take 600 mg by mouth two (2) times a day.   lipase-protease-amylase (ZENPEP) 20,000-63,000- 84,000 unit capsule   Yes Yes   Sig: Take 8 Caps by mouth daily.   metoprolol tartrate (LOPRESSOR) 25 mg tablet   Yes Yes   Sig: Take 25 mg by mouth daily.   tamsulosin (FLOMAX) 0.4 mg capsule   Yes Yes   Sig: Take 0.4 mg by mouth daily.   tiZANidine (ZANAFLEX) 4 mg tablet   Yes Yes   Sig: Take 4 mg by mouth nightly.      Facility-Administered Medications: None           REVIEW OF SYSTEMS:     [] Unable to obtain  ROS due to  []mental status change  []sedated   []intubated   [x]Total of 12 systems reviewed as follows:  Constitutional: Positive for fever and chills  Eyes:   negative visual changes  ENT:   negative sore throat, tongue or lip swelling  Respiratory:  negative cough, negative dyspnea  Cards:  negative for chest pain, palpitations, lower extremity edema  GI:   negative for nausea, vomiting, diarrhea, and abdominal pain  Genitourinary: negative for frequency, dysuria  Integument:  negative for rash and pruritus  Hematologic:  negative for easy bruising and gum/nose bleeding  Musculoskel: Positive  for myalgias  Neurological:  negative for headaches, dizziness, vertigo  Behavl/Psych: negative for feelings of anxiety, depression       Objective:   VITALS:  Visit Vitals  BP 129/64 (BP 1 Location: Left arm, BP Patient Position: Supine)   Pulse 72   Temp 99.4 ??F (37.4 ??C)   Resp 16   Ht '5\' 8"'$  (1.727 m)   Wt 63.5 kg (140 lb)   SpO2 100%   BMI 21.29 kg/m??     Temp (24hrs), Avg:100 ??F (37.8 ??C), Min:99.4 ??F (37.4 ??C), Max:100.6 ??F (38.1 ??C)      PHYSICAL EXAM:   General:    Alert, cooperative, no distress, appears stated age.     Head:   Normocephalic, without obvious abnormality, atraumatic.  Eyes:   Conjunctivae clear, anicteric sclerae.  Pupils are equal  Nose:  Nares normal. No drainage or sinus tenderness.  Throat:    Lips, mucosa, and tongue normal.  No Thrush  Neck:  Supple, symmetrical,  no adenopathy, thyroid: non tender    no carotid bruit and no JVD.  Back:    Symmetric,  No CVA tenderness.  Lungs:   Clear to auscultation bilaterally.  No Wheezing or Rhonchi. No rales.  Chest wall:  No tenderness or deformity. No Accessory muscle use.  Heart:   Regular rate and rhythm,  no murmur, rub or gallop.  Abdomen:   Soft, non-tender. Not distended.  Bowel sounds normal. No masses  Extremities: Extremities normal, atraumatic, No cyanosis.  No edema. No clubbing  Skin:     Texture, turgor normal. No rashes or lesions.  Not Jaundiced  Psych:  Good insight.  Not depressed.  Not anxious or agitated.  Neurologic: EOMs intact. No facial asymmetry. No aphasia or slurred speech. Normal  strength, Alert and oriented X 3.       LAB DATA REVIEWED:    Recent Results (from the past 12 hour(s))   CBC WITH AUTOMATED DIFF    Collection Time: 01/17/18  4:34 PM   Result Value Ref Range    WBC 7.4 4.0 - 11.0 1000/mm3    RBC 3.93 3.80 - 5.70 M/uL    HGB 10.1 (L) 12.4 - 17.2 gm/dl    HCT 32.5 (L) 37.0 - 50.0 %    MCV 82.7 80.0 - 98.0 fL    MCH 25.7 23.0 - 34.6 pg    MCHC 31.1 30.0 - 36.0 gm/dl    PLATELET 141 140 - 450 1000/mm3    MPV  12.3 (H) 6.0 - 10.0 fL    RDW-SD 58.0 (H) 35.1 - 43.9      NRBC 0 0 - 0      IMMATURE GRANULOCYTES 0.3 0.0 - 3.0 %    NEUTROPHILS 83.4 (H) 34 - 64 %    LYMPHOCYTES 6.8 (L) 28 - 48 %    MONOCYTES 9.0 1 - 13 %    EOSINOPHILS 0.4 0 - 5 %    BASOPHILS 0.1 0 - 3 %   METABOLIC PANEL, COMPREHENSIVE    Collection Time: 01/17/18  4:34 PM   Result Value Ref Range    Sodium 140 136 - 145 mEq/L    Potassium 4.1 3.5 - 5.1 mEq/L    Chloride 111 (H) 98 - 107 mEq/L    CO2 20 (L) 21 - 32 mEq/L    Glucose 155 (H) 74 - 106 mg/dl    BUN 26 (H) 7 - 25 mg/dl    Creatinine 1.5 (H) 0.6 - 1.3 mg/dl    GFR est AA 59.0      GFR est non-AA 49      Calcium 9.0 8.5 - 10.1  mg/dl    AST (SGOT) 39 (H) 15 - 37 U/L    ALT (SGPT) 42 12 - 78 U/L    Alk. phosphatase 141 (H) 45 - 117 U/L    Bilirubin, total 0.7 0.2 - 1.0 mg/dl    Protein, total 8.1 6.4 - 8.2 gm/dl    Albumin 3.8 3.4 - 5.0 gm/dl    Anion gap 9 5 - 15 mmol/L   LACTIC ACID    Collection Time: 01/17/18  4:34 PM   Result Value Ref Range    Lactic Acid 2.0 0.4 - 2.0 mmol/L   PROCALCITONIN    Collection Time: 01/17/18  4:34 PM   Result Value Ref Range    PROCALCITONIN 0.42 0.00 - 0.50 ng/ml   URINALYSIS W/ RFLX MICROSCOPIC    Collection Time: 01/17/18  5:02 PM   Result Value Ref Range    Color YELLOW YELLOW,STRAW      Appearance CLEAR CLEAR      Glucose NEGATIVE NEGATIVE,Negative mg/dl    Bilirubin NEGATIVE NEGATIVE,Negative      Ketone NEGATIVE NEGATIVE,Negative mg/dl    Specific gravity 1.020 1.005 - 1.030      Blood NEGATIVE NEGATIVE,Negative      pH (UA) 5.5 5.0 - 9.0      Protein NEGATIVE NEGATIVE,Negative mg/dl    Urobilinogen 0.2 0.0 - 1.0 mg/dl    Nitrites NEGATIVE NEGATIVE,Negative      Leukocyte Esterase NEGATIVE NEGATIVE,Negative     POC URINE MACROSCOPIC    Collection Time: 01/17/18  5:05 PM   Result Value Ref Range    Glucose Negative NEGATIVE,Negative mg/dl    Bilirubin Small (A) NEGATIVE,Negative      Ketone Negative NEGATIVE,Negative mg/dl    Specific gravity 1.020 1.005 - 1.030       Blood Negative NEGATIVE,Negative      pH (UA) 5.5 5 - 9      Protein Negative NEGATIVE,Negative mg/dl    Urobilinogen 0.2 0.0 - 1.0 EU/dl    Nitrites Negative NEGATIVE,Negative      Leukocyte Esterase Negative NEGATIVE,Negative      Color Yellow      Appearance Clear           IMAGING RESULTS:    Xr Chest Sngl V    Result Date: 01/17/2018  EXAM:  Chest AP INDICATIONS: sepsis COMPARISON: None. FINDINGS: No consolidation, pleural effusion, or pulmonary edema. No pneumothorax. Normal cardiomediastinal contours. Median sternotomy wires and changes post-CABG.     IMPRESSION: No acute cardiopulmonary process.     Care Plan discussed with:   [x]Patient   [x]Family    []ED Care Manager  []ED Doc   []Specialist :    Total care time spent on reviewing the case/data/notes/EMR,examining the patient,documentation,coordinating care with nurses and consultants is 40  minutes.       ___________________________________________________  Admitting Physician: Charlaine Dalton, MD     Dragon medical dictation software was used for portions of this report.  Unintended voice transcription errors may have occurred.

## 2018-01-17 NOTE — ED Provider Notes (Signed)
[facility]  Emergency Department Treatment Report    Patient: Tommy Rutter Sr. Age: 72 y.o. Sex: male    Date of Birth: 10/26/1945 Admit Date: 01/17/2018 PCP: None   MRN: [id number]  CSN: [id number]  Attending: Tawanna Cooler, MD   Room: ER15/ER15 Time Dictated: 7:35 PM        Chief Complaint   Chief Complaint   Patient presents with   ??? 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   HENT: Negative for sore throat.    Eyes: Negative for double vision.   Respiratory: Negative for shortness of breath.    Cardiovascular: Negative for chest pain.   Gastrointestinal: Negative for abdominal pain.   Genitourinary: Negative for dysuria.   Musculoskeletal: Positive for myalgias. Negative for back pain.   Skin: Negative for rash.   Neurological: Negative for headaches.       Past Medical/Surgical History     Past Medical History:   Diagnosis Date   ??? Hx of CABG    ??? Kidney stones    ??? Pancreatic cancer [facility])      Past Surgical History:   Procedure Laterality Date   ??? HX ORTHOPAEDIC      back       Social History     Social History     Socioeconomic History   ??? Marital status: SINGLE     Spouse name: Not on file   ??? Number of children: Not on file   ??? Years of education: Not on file   ??? Highest education level: Not on file   Tobacco Use   ??? Smoking status: Never Smoker   ??? Smokeless tobacco: Never Used   Substance and Sexual Activity   ??? Drug use: Not Currently   ??? Sexual activity: Not Currently        Family History   History reviewed. No pertinent family history.    Current Medications     Prior to Admission Medications   Prescriptions Last Dose Informant Patient Reported? Taking?   TESTOSTERONE IM   Yes Yes   Sig: by IntraMUSCular route.   allopurinol (ZYLOPRIM) 300 mg tablet   Yes Yes   Sig: Take  by mouth daily.   amLODIPine (NORVASC) 5 mg tablet   Yes Yes   Sig: Take 5 mg by mouth daily.   aspirin 81 mg chewable tablet   Yes Yes   Sig: Take 81 mg by mouth daily.   fluticasone propionate 100 mcg/actuation dsdv   Yes Yes   Sig: Take  by inhalation.   gabapentin (NEURONTIN) 300 mg capsule   Yes Yes   Sig: Take 300 mg by mouth three (3) times daily.   lipase-protease-amylase (ZENPEP) 20,000-63,000- 84,000 unit capsule   Yes Yes   Sig: Take  by mouth.   metoprolol tartrate (LOPRESSOR) 25 mg tablet   Yes Yes   Sig: Take 25 mg by mouth two (  2) times a day.   omeprazole (PRILOSEC) 40 mg capsule   Yes Yes   Sig: Take 40 mg by mouth daily.   tamsulosin (FLOMAX) 0.4 mg capsule   Yes Yes   Sig: Take 0.4 mg by mouth daily.   tiZANidine (ZANAFLEX) 4 mg tablet   Yes Yes   Sig: Take 4 mg by mouth three (3) times daily as needed.      Facility-Administered Medications: None       Allergies   No Known Allergies    Physical Exam/ED Course / Medical Decision Making     ED Triage Vitals   Enc Vitals Group      BP 01/17/18 1629 133/63      Pulse (Heart Rate) 01/17/18 1629 86      Resp Rate 01/17/18 1629 17      Temp 01/17/18 1624 (!) 100.6 ??F (38.1 ??C)      Temp src --       O2 Sat (%) 01/17/18 1629 95 %      Weight 01/17/18 1624 140 lb      Height 01/17/18 1624 5' 8"       Head Circumference --       Peak Flow --       Pain Score --       Pain Loc --       Pain Edu? --       Excl. in [place]? --        Physical exam:  General:  Well-developed, well-nourished, warm to touch nondiaphoretic.    Head:  Normocephalic atraumatic.    Eyes:  Pupils midrange extraocular movements intact.  No pallor or conjunctival injection.    Nose:  No  rhinorrhea, inspection grossly normal.    Ears:  Grossly normal to inspection, no discharge.    Mouth:  Mucous membranes moist, no appreciable intraoral lesion.    Neck/Back:  Trachea midline, no asymmetry.    Chest:  Grossly normal inspection, symmetric chest rise.    Pulmonary:  Clear to auscultation bilaterally no wheezes rhonchi or rales.    Cardiovascular:  S1-S2 no murmurs rubs or gallops.    Abdomen: Soft, nontender, nondistended no guarding rebound or peritoneal signs.  No epigastric discomfort  Extremities:  Grossly normal to inspection, peripheral pulses intact    Neurologic:  Alert and oriented no appreciable focal neurologic deficit    Skin:  Warm and dry  Psychiatric:  Grossly normal mood and affect  Nursing note reviewed, vital signs reviewed.    ED course:  Patient presents for evaluation of myalgias, is febrile here not tachycardic.  Recently took Tylenol Toradol and Aleve.  His primary concern is that he has a recurrence of his microperforation that is normally treated conservatively with IV antibiotics.    Initiated sepsis workup, started Zosyn    Unable to find records in EMR, will have nursing staff request medical records from his previous hospital.  At this time he is reporting high risk for deterioration if discharged home we will admit for further management    Labs: White blood cell count is negative, AST 39 alkaline phosphatase 141, he does have an acidosis with a CO2 of 20, BUN is 26 and creatinine 1.5.      Patient's presentation, history, physical exam and laboratory evaluations were reviewed.  I felt the patient would benefit from inpatient management and treatment.     Consult:  Discussed care with Dr. Winona Legato. Standard discussion; including history of patient???s  chief complaint, available diagnostic results, and treatment course.  Patient was accepted to their service.  Requested GI involvement     Consult:  Discussed care with Dr. Eugenia Pancoast. Standard discussion; including history of  patient???s chief complaint, available diagnostic results, and treatment course.  Agrees with blood culture, recommends MRCP and will see in hospital        Disposition:    Admitted to medicine service      Portions of this chart were created with Dragon medical speech to text program.   Unrecognized errors may be present.            Final Diagnosis       ICD-10-CM ICD-9-CM   1. Sepsis, due to unspecified organism, unspecified whether acute organ dysfunction present [facility]) A41.9 038.9     995.91         Diagnostic Studies   Lab:   Recent Results (from the past 12 hour(s))   CBC WITH AUTOMATED DIFF    Collection Time: 01/17/18  4:34 PM   Result Value Ref Range    WBC 7.4 4.0 - 11.0 1000/mm3    RBC 3.93 3.80 - 5.70 M/uL    HGB 10.1 (L) 12.4 - 17.2 gm/dl    HCT 32.5 (L) 37.0 - 50.0 %    MCV 82.7 80.0 - 98.0 fL    MCH 25.7 23.0 - 34.6 pg    MCHC 31.1 30.0 - 36.0 gm/dl    PLATELET 141 140 - 450 1000/mm3    MPV 12.3 (H) 6.0 - 10.0 fL    RDW-SD 58.0 (H) 35.1 - 43.9      NRBC 0 0 - 0      IMMATURE GRANULOCYTES 0.3 0.0 - 3.0 %    NEUTROPHILS 83.4 (H) 34 - 64 %    LYMPHOCYTES 6.8 (L) 28 - 48 %    MONOCYTES 9.0 1 - 13 %    EOSINOPHILS 0.4 0 - 5 %    BASOPHILS 0.1 0 - 3 %   METABOLIC PANEL, COMPREHENSIVE    Collection Time: 01/17/18  4:34 PM   Result Value Ref Range    Sodium 140 136 - 145 mEq/L    Potassium 4.1 3.5 - 5.1 mEq/L    Chloride 111 (H) 98 - 107 mEq/L    CO2 20 (L) 21 - 32 mEq/L    Glucose 155 (H) 74 - 106 mg/dl    BUN 26 (H) 7 - 25 mg/dl    Creatinine 1.5 (H) 0.6 - 1.3 mg/dl    GFR est AA 59.0      GFR est non-AA 49      Calcium 9.0 8.5 - 10.1 mg/dl    AST (SGOT) 39 (H) 15 - 37 U/L    ALT (SGPT) 42 12 - 78 U/L    Alk. phosphatase 141 (H) 45 - 117 U/L    Bilirubin, total 0.7 0.2 - 1.0 mg/dl    Protein, total 8.1 6.4 - 8.2 gm/dl    Albumin 3.8 3.4 - 5.0 gm/dl    Anion gap 9 5 - 15 mmol/L   LACTIC ACID    Collection Time: 01/17/18  4:34 PM   Result Value Ref Range    Lactic Acid 2.0 0.4 - 2.0 mmol/L   PROCALCITONIN     Collection Time: 01/17/18  4:34 PM   Result Value Ref Range    PROCALCITONIN 0.42 0.00 - 0.50 ng/ml   URINALYSIS W/ RFLX MICROSCOPIC    Collection Time: 01/17/18  5:02 PM   Result Value Ref Range    Color YELLOW YELLOW,STRAW      Appearance CLEAR CLEAR      Glucose NEGATIVE NEGATIVE,Negative mg/dl    Bilirubin NEGATIVE NEGATIVE,Negative      Ketone NEGATIVE NEGATIVE,Negative mg/dl    Specific gravity 1.020 1.005 - 1.030      Blood NEGATIVE NEGATIVE,Negative      pH (UA) 5.5 5.0 - 9.0      Protein NEGATIVE NEGATIVE,Negative mg/dl    Urobilinogen 0.2 0.0 - 1.0 mg/dl    Nitrites NEGATIVE NEGATIVE,Negative      Leukocyte Esterase NEGATIVE NEGATIVE,Negative     POC URINE MACROSCOPIC    Collection Time: 01/17/18  5:05 PM   Result Value Ref Range    Glucose Negative NEGATIVE,Negative mg/dl    Bilirubin Small (A) NEGATIVE,Negative      Ketone Negative NEGATIVE,Negative mg/dl    Specific gravity 1.020 1.005 - 1.030      Blood Negative NEGATIVE,Negative      pH (UA) 5.5 5 - 9      Protein Negative NEGATIVE,Negative mg/dl    Urobilinogen 0.2 0.0 - 1.0 EU/dl    Nitrites Negative NEGATIVE,Negative      Leukocyte Esterase Negative NEGATIVE,Negative      Color Yellow      Appearance Clear         Imaging:    Xr Chest Sngl V    Result Date: 01/17/2018  EXAM:  Chest AP INDICATIONS: sepsis COMPARISON: None. FINDINGS: No consolidation, pleural effusion, or pulmonary edema. No pneumothorax. Normal cardiomediastinal contours. Median sternotomy wires and changes post-CABG.     IMPRESSION: No acute cardiopulmonary process.                  Medications   piperacillin-tazobactam (ZOSYN) 4.5 g in 0.9% sodium chloride (MBP/ADV) 100 mL MBP (0 g IntraVENous IV Completed 01/17/18 1822)   sodium chloride 0.9 % bolus infusion 1,000 mL (1,000 mL IntraVENous New Bag 01/17/18 1753)     Followed by   sodium chloride 0.9 % bolus infusion 905 mL (905 mL IntraVENous New Bag 01/17/18 1753)          Results of lab/radiology tests were discussed with the  patient. . All questions were answered and concerns addressed.        My signature above authenticates this document and my orders, the final    diagnosis (es), discharge prescription (s), and instructions in the Epic record.  If you have any questions please contact [phone].

## 2018-01-17 NOTE — Progress Notes (Signed)
Problem: Pain  Goal: *Control of Pain  Outcome: Progressing Towards Goal     Problem: Falls - Risk of  Goal: *Absence of Falls  Description: Document Schmid Fall Risk and appropriate interventions in the flowsheet.  Outcome: Progressing Towards Goal  Note: Fall Risk Interventions:            Medication Interventions: Bed/chair exit alarm, Patient to call before getting OOB

## 2018-01-17 NOTE — Progress Notes (Signed)
 [facility] Pharmacy Services: Medication History    Medication History Completed Prior to Order Reconciliation?  YES  If no and discrepancies were noted please contact attending physician or pharmacist to follow-up: N/A - Medication history was obtained prior to reconciliation.    Information obtained from (list all that apply, 2 sources preferred): Patient and Other: [employer]   If a history was not reviewed directly with patient/caregiver please comment with the reason why: N/A     Antibiotic use in the last 90 days (3 months): NO      Missing Medication Identified  NO  Number of medications: -  Indicate action taken: N/A      Wrong Medication Identified YES   Number of medications: 1  Indicate action taken: Updated Medication List OMEPRAZOLE  40MG        Wrong Dose/Interval/Route Identified YES              Number of medications: 3   Indicate action taken: Updated Medication List GABAPENTIN  600MG , TIZANIDINE 4MG , LOPRESSOR  25MG          Is patient currently taking warfarin:  No        Medication Compliance Issues and/or Medication Concerns: N/A      Allergies: Patient has no known allergies.    Prior to Admission Medications:    Prior to Admission Medications   Prescriptions Last Dose Informant Patient Reported? Taking?   TESTOSTERONE IM   Yes Yes   Sig: by IntraMUSCular route.   allopurinol  (ZYLOPRIM ) 300 mg tablet   Yes Yes   Sig: Take  by mouth daily.   amLODIPine  (NORVASC ) 5 mg tablet   Yes Yes   Sig: Take 5 mg by mouth daily.   aspirin  81 mg chewable tablet   Yes Yes   Sig: Take 81 mg by mouth daily.   fluticasone propionate 100 mcg/actuation dsdv   Yes Yes   Sig: Take  by inhalation.   gabapentin  (NEURONTIN ) 600 mg tablet   Yes Yes   Sig: Take 600 mg by mouth two (2) times a day.   lipase -protease -amylase  (ZENPEP ) 20,000-63,000- 84,000 unit capsule   Yes Yes   Sig: Take 8 Caps by mouth daily.   metoprolol  tartrate (LOPRESSOR ) 25 mg tablet   Yes Yes   Sig: Take 25 mg by mouth daily.   tamsulosin  (FLOMAX ) 0.4 mg  capsule   Yes Yes   Sig: Take 0.4 mg by mouth daily.   tiZANidine (ZANAFLEX) 4 mg tablet   Yes Yes   Sig: Take 4 mg by mouth nightly.      Facility-Administered Medications: None         Hannah KIDD E[place]   Contact: [phone]

## 2018-01-17 NOTE — ED Notes (Signed)
TRANSFER - OUT REPORT:    Verbal report given to Simon RN(name) on Tommy F Gammon Sr.  being transferred to [address] (unit) for routine progression of care       Report consisted of patient's Situation, Background, Assessment and   Recommendations(SBAR).     Information from the following report(s) ED Summary was reviewed with the receiving nurse.    Lines:   Peripheral IV 01/17/18 Right Antecubital (Active)   Site Assessment Clean, dry, & intact 01/17/2018  4:36 PM   Phlebitis Assessment 0 01/17/2018  4:36 PM   Infiltration Assessment 0 01/17/2018  4:36 PM   Dressing Status Clean, dry, & intact 01/17/2018  4:36 PM   Dressing Type Transparent 01/17/2018  4:36 PM   Hub Color/Line Status Flushed 01/17/2018  4:36 PM   Action Taken Blood drawn 01/17/2018  4:36 PM   Alcohol Cap Used Yes 01/17/2018  4:36 PM       Peripheral IV 01/17/18 Left Forearm (Active)   Site Assessment Clean, dry, & intact 01/17/2018  4:42 PM   Phlebitis Assessment 0 01/17/2018  4:42 PM   Infiltration Assessment 0 01/17/2018  4:42 PM   Dressing Status Clean, dry, & intact 01/17/2018  4:42 PM   Dressing Type Transparent 01/17/2018  4:42 PM   Hub Color/Line Status Flushed 01/17/2018  4:42 PM   Action Taken Blood drawn 01/17/2018  4:42 PM   Alcohol Cap Used Yes 01/17/2018  4:42 PM        Opportunity for questions and clarification was provided.      Patient transported with:   [employer]

## 2018-01-18 ENCOUNTER — Inpatient Hospital Stay: Admit: 2018-01-18 | Payer: MEDICARE | Primary: Family Medicine

## 2018-01-18 LAB — METABOLIC PANEL, BASIC
Anion gap: 6 mmol/L (ref 5–15)
BUN: 21 mg/dl (ref 7–25)
CO2: 23 mEq/L (ref 21–32)
Calcium: 8.3 mg/dl — ABNORMAL LOW (ref 8.5–10.1)
Chloride: 112 mEq/L — ABNORMAL HIGH (ref 98–107)
Creatinine: 1.5 mg/dl — ABNORMAL HIGH (ref 0.6–1.3)
GFR est AA: 59
GFR est non-AA: 49
Glucose: 119 mg/dl — ABNORMAL HIGH (ref 74–106)
Potassium: 4.8 mEq/L (ref 3.5–5.1)
Sodium: 141 mEq/L (ref 136–145)

## 2018-01-18 LAB — CBC WITH AUTOMATED DIFF
BASOPHILS: 0.2 % (ref 0–3)
EOSINOPHILS: 1.2 % (ref 0–5)
HCT: 29.7 % — ABNORMAL LOW (ref 37.0–50.0)
HGB: 8.7 gm/dl — ABNORMAL LOW (ref 12.4–17.2)
IMMATURE GRANULOCYTES: 0.3 % (ref 0.0–3.0)
LYMPHOCYTES: 17.1 % — ABNORMAL LOW (ref 28–48)
MCH: 25.5 pg (ref 23.0–34.6)
MCHC: 29.3 gm/dl — ABNORMAL LOW (ref 30.0–36.0)
MCV: 87.1 fL (ref 80.0–98.0)
MONOCYTES: 14 % — ABNORMAL HIGH (ref 1–13)
MPV: 11.9 fL — ABNORMAL HIGH (ref 6.0–10.0)
NEUTROPHILS: 67.2 % — ABNORMAL HIGH (ref 34–64)
NRBC: 0 (ref 0–0)
PLATELET: 101 10*3/uL — ABNORMAL LOW (ref 140–450)
RBC: 3.41 M/uL — ABNORMAL LOW (ref 3.80–5.70)
RDW-SD: 61.2 — ABNORMAL HIGH (ref 35.1–43.9)
WBC: 6.6 10*3/uL (ref 4.0–11.0)

## 2018-01-18 LAB — LACTIC ACID
LACTIC ACID: 1 mmol/L (ref 0.4–2.0)
LACTIC ACID: 1 mmol/L (ref 0.4–2.0)
LACTIC ACID: 1.2 mmol/L (ref 0.4–2.0)
Lactic Acid: 1 mmol/L (ref 0.4–2.0)
Lactic Acid: 1 mmol/L (ref 0.4–2.0)
Lactic Acid: 1.2 mmol/L (ref 0.4–2.0)

## 2018-01-18 LAB — GLUCOSE, POC: Glucose (POC): 115 mg/dL — ABNORMAL HIGH (ref 65–105)

## 2018-01-18 LAB — BASIC METABOLIC PANEL
Anion Gap: 6 mmol/L (ref 5–15)
BUN: 21 mg/dl (ref 7–25)
CO2: 23 mEq/L (ref 21–32)
Calcium: 8.3 mg/dl — ABNORMAL LOW (ref 8.5–10.1)
Chloride: 112 mEq/L — ABNORMAL HIGH (ref 98–107)
Creatinine: 1.5 mg/dl — ABNORMAL HIGH (ref 0.6–1.3)
EGFR IF NonAfrican American: 49
GFR African American: 59
Glucose: 119 mg/dl — ABNORMAL HIGH (ref 74–106)
Potassium: 4.8 mEq/L (ref 3.5–5.1)
Sodium: 141 mEq/L (ref 136–145)

## 2018-01-18 LAB — CBC WITH AUTO DIFFERENTIAL
Basophils %: 0.2 % (ref 0–3)
Eosinophils %: 1.2 % (ref 0–5)
Hematocrit: 29.7 % — ABNORMAL LOW (ref 37.0–50.0)
Hemoglobin: 8.7 gm/dl — ABNORMAL LOW (ref 12.4–17.2)
Immature Granulocytes: 0.3 % (ref 0.0–3.0)
Lymphocytes %: 17.1 % — ABNORMAL LOW (ref 28–48)
MCH: 25.5 pg (ref 23.0–34.6)
MCHC: 29.3 gm/dl — ABNORMAL LOW (ref 30.0–36.0)
MCV: 87.1 fL (ref 80.0–98.0)
MPV: 11.9 fL — ABNORMAL HIGH (ref 6.0–10.0)
Monocytes %: 14 % — ABNORMAL HIGH (ref 1–13)
Neutrophils %: 67.2 % — ABNORMAL HIGH (ref 34–64)
Nucleated RBCs: 0 (ref 0–0)
Platelets: 101 10*3/uL — ABNORMAL LOW (ref 140–450)
RBC: 3.41 M/uL — ABNORMAL LOW (ref 3.80–5.70)
RDW-SD: 61.2 — ABNORMAL HIGH (ref 35.1–43.9)
WBC: 6.6 10*3/uL (ref 4.0–11.0)

## 2018-01-18 LAB — POCT GLUCOSE: POC Glucose: 115 mg/dL — ABNORMAL HIGH (ref 65–105)

## 2018-01-18 MED ORDER — SODIUM CHLORIDE 0.9 % IJ SYRG
INTRAMUSCULAR | Status: DC | PRN
Start: 2018-01-18 — End: 2018-01-21

## 2018-01-18 MED ORDER — HYDROCODONE-ACETAMINOPHEN 5 MG-325 MG TAB
5-325 mg | ORAL | Status: DC | PRN
Start: 2018-01-18 — End: 2018-01-18

## 2018-01-18 MED ORDER — HEPARIN (PORCINE) 5,000 UNIT/ML IJ SOLN
5000 unit/mL | Freq: Three times a day (TID) | INTRAMUSCULAR | Status: DC
Start: 2018-01-18 — End: 2018-01-17

## 2018-01-18 MED ORDER — TIZANIDINE 4 MG TAB
4 mg | Freq: Every evening | ORAL | Status: DC
Start: 2018-01-18 — End: 2018-01-21
  Administered 2018-01-18 – 2018-01-21 (×4): via ORAL

## 2018-01-18 MED ORDER — GABAPENTIN 300 MG CAP
300 mg | Freq: Two times a day (BID) | ORAL | Status: DC
Start: 2018-01-18 — End: 2018-01-21
  Administered 2018-01-18 – 2018-01-21 (×7): via ORAL

## 2018-01-18 MED ORDER — ASPIRIN 81 MG CHEWABLE TAB
81 mg | Freq: Every day | ORAL | Status: DC
Start: 2018-01-18 — End: 2018-01-21
  Administered 2018-01-18 – 2018-01-21 (×4): via ORAL

## 2018-01-18 MED ORDER — ALLOPURINOL 100 MG TAB
100 mg | Freq: Every day | ORAL | Status: DC
Start: 2018-01-18 — End: 2018-01-21
  Administered 2018-01-18 – 2018-01-21 (×4): via ORAL

## 2018-01-18 MED ORDER — SODIUM CHLORIDE 0.9 % IJ SYRG
Freq: Three times a day (TID) | INTRAMUSCULAR | Status: DC
Start: 2018-01-18 — End: 2018-01-21
  Administered 2018-01-18 – 2018-01-21 (×11): via INTRAVENOUS

## 2018-01-18 MED ORDER — DEXTROSE 5%-1/2 NORMAL SALINE IV
INTRAVENOUS | Status: DC
Start: 2018-01-18 — End: 2018-01-21
  Administered 2018-01-18 – 2018-01-21 (×4): via INTRAVENOUS

## 2018-01-18 MED ORDER — ENOXAPARIN 40 MG/0.4 ML SUB-Q SYRINGE
40 mg/0.4 mL | SUBCUTANEOUS | Status: DC
Start: 2018-01-18 — End: 2018-01-21
  Administered 2018-01-18 – 2018-01-21 (×4): via SUBCUTANEOUS

## 2018-01-18 MED ORDER — PIPERACILLIN-TAZOBACTAM 3.375 GRAM IV SOLR
3.375 gram | Freq: Three times a day (TID) | INTRAVENOUS | Status: DC
Start: 2018-01-18 — End: 2018-01-21
  Administered 2018-01-18 – 2018-01-21 (×10): via INTRAVENOUS

## 2018-01-18 MED ORDER — ACETAMINOPHEN 325 MG TABLET
325 mg | Freq: Four times a day (QID) | ORAL | Status: DC | PRN
Start: 2018-01-18 — End: 2018-01-21
  Administered 2018-01-19 – 2018-01-20 (×2): via ORAL

## 2018-01-18 MED ORDER — TAMSULOSIN SR 0.4 MG 24 HR CAP
0.4 mg | Freq: Every day | ORAL | Status: DC
Start: 2018-01-18 — End: 2018-01-21
  Administered 2018-01-18 – 2018-01-21 (×4): via ORAL

## 2018-01-18 MED ORDER — AMLODIPINE 5 MG TAB
5 mg | Freq: Every day | ORAL | Status: DC
Start: 2018-01-18 — End: 2018-01-21
  Administered 2018-01-18 – 2018-01-21 (×4): via ORAL

## 2018-01-18 MED ORDER — MORPHINE 2 MG/ML INJECTION
2 mg/mL | INTRAMUSCULAR | Status: DC | PRN
Start: 2018-01-18 — End: 2018-01-18

## 2018-01-18 MED ORDER — METOPROLOL TARTRATE 25 MG TAB
25 mg | Freq: Every day | ORAL | Status: DC
Start: 2018-01-18 — End: 2018-01-21
  Administered 2018-01-18 – 2018-01-21 (×4): via ORAL

## 2018-01-18 MED ORDER — KETOROLAC TROMETHAMINE 30 MG/ML INJECTION
30 mg/mL (1 mL) | Freq: Four times a day (QID) | INTRAMUSCULAR | Status: DC | PRN
Start: 2018-01-18 — End: 2018-01-21
  Administered 2018-01-18 – 2018-01-19 (×3): via INTRAVENOUS

## 2018-01-18 MED ORDER — ALBUTEROL SULFATE 0.083 % (0.83 MG/ML) SOLN FOR INHALATION
2.5 mg /3 mL (0.083 %) | Freq: Four times a day (QID) | RESPIRATORY_TRACT | Status: DC | PRN
Start: 2018-01-18 — End: 2018-01-21

## 2018-01-18 MED ORDER — ALUM-MAG HYDROXIDE-SIMETH 200 MG-200 MG-20 MG/5 ML ORAL SUSP
200-200-20 mg/5 mL | ORAL | Status: DC | PRN
Start: 2018-01-18 — End: 2018-01-21

## 2018-01-18 MED ORDER — NALOXONE 0.4 MG/ML INJECTION
0.4 mg/mL | INTRAMUSCULAR | Status: DC | PRN
Start: 2018-01-18 — End: 2018-01-21

## 2018-01-18 MED FILL — KETOROLAC TROMETHAMINE 30 MG/ML INJECTION: 30 mg/mL (1 mL) | INTRAMUSCULAR | Qty: 1

## 2018-01-18 MED FILL — BD POSIFLUSH NORMAL SALINE 0.9 % INJECTION SYRINGE: INTRAMUSCULAR | Qty: 10

## 2018-01-18 MED FILL — PIPERACILLIN-TAZOBACTAM 3.375 GRAM IV SOLR: 3.375 gram | INTRAVENOUS | Qty: 3.38

## 2018-01-18 MED FILL — AMLODIPINE 5 MG TAB: 5 mg | ORAL | Qty: 1

## 2018-01-18 MED FILL — TIZANIDINE 4 MG TAB: 4 mg | ORAL | Qty: 1

## 2018-01-18 MED FILL — ASPIRIN 81 MG CHEWABLE TAB: 81 mg | ORAL | Qty: 1

## 2018-01-18 MED FILL — TAMSULOSIN SR 0.4 MG 24 HR CAP: 0.4 mg | ORAL | Qty: 1

## 2018-01-18 MED FILL — LOVENOX 40 MG/0.4 ML SUBCUTANEOUS SYRINGE: 40 mg/0.4 mL | SUBCUTANEOUS | Qty: 0.4

## 2018-01-18 MED FILL — GABAPENTIN 300 MG CAP: 300 mg | ORAL | Qty: 2

## 2018-01-18 MED FILL — ALLOPURINOL 100 MG TAB: 100 mg | ORAL | Qty: 1

## 2018-01-18 MED FILL — HYDROCODONE-ACETAMINOPHEN 5 MG-325 MG TAB: 5-325 mg | ORAL | Qty: 1

## 2018-01-18 MED FILL — DEXTROSE 5%-1/2 NORMAL SALINE IV: INTRAVENOUS | Qty: 1000

## 2018-01-18 MED FILL — METOPROLOL TARTRATE 25 MG TAB: 25 mg | ORAL | Qty: 1

## 2018-01-18 NOTE — Consults (Signed)
[facility] REPORT  NAME:  Tommy Brown  SEX:   M  ADMIT: 01/17/2018  DATE OF CONSULT: 01/18/2018  REFERRING PHYSICIAN:    DOB: 1945/06/26  MR#    [id number]  ROOM:  2125  ACCT#  [id number]    cc: Cheri Guppy MD    HISTORY OF PRESENT ILLNESS:  The patient is a 72 year old male who apparently was treated at [place], [place] in 2012 for pancreatic cancer.  He underwent Whipple surgery and received adjuvant chemoradiation after the surgery.  The patient was not able to complete his adjuvant course of chemoradiation because of toxicity.  He only received half of the planned treatment.      However, the patient has actually done well from a surgical standpoint.  He has not had any evidence of recurrence.      However, the patient did have significant problems from the surgery itself and has had recurrent episodes of sepsis, which reportedly is due to an infected pouch.  The patient claims that he has an infection that happens about once a year.      The patient has now moved to the [place] area.  He had sudden onset of abdominal pain and fever and chills.  He came in to be admitted.  He is now started on IV antibiotics.    The patient is now referred for evaluation because of his history of pancreatic cancer.    REVIEW OF SYSTEMS:  The patient is weak.  The patient had fever, chills or night sweats but now better.  Has appetite had been good except recently.  Weight has been stable.  No headaches, dizziness, diplopia.  No mouth sores or dysphagia.  No hemoptysis, pleurisy, wheezing.  No angina, palpitation, no syncope.  Abdominal pain is now better.  Has no rectal bleeding, melena, hematemesis.  No dysuria, hematuria, no incontinence.  No delusion or hallucination, no seizure.    PAST MEDICAL HISTORY:  Coronary artery disease, kidney cancer, pancreatic cancer.    PAST SURGICAL HISTORY:  CABG, C-spine surgery, carotid endarterectomy, Whipple procedure.     SOCIAL HISTORY:  The patient is a nonsmoker, no alcohol abuse.    FAMILY HISTORY:  One brother had pancreatic cancer in his 5s.  Another brother had pancreatic cancer at the age of 2.    CURRENT MEDICATIONS:  Includes allopurinol, Norvasc, aspirin, subcutaneous Lovenox, Neurontin, metoprolol, Zosyn, Flomax and Zanaflex.    PHYSICAL EXAMINATION:  GENERAL:  Elderly male, looks comfortable and not in respiratory distress.  HEENT:  PERRLA, anicteric sclerae, no oral thrush, no mucositis.  CHEST AND LUNGS:  Clear breath sounds.  No rales, no wheezing.  HEART:  S1, S2, no murmur.  ABDOMEN:  Soft, nontender, no hepatosplenomegaly.  EXTREMITIES:  No pedal edema.  NEUROLOGIC:  No focal deficits.    LABORATORY DATA:  WBC 6.6, hemoglobin 8.7, hematocrit 30, MCV of 87, platelet count of 101,000.  Sodium 141, potassium 4.8, BUN of 21, creatinine 1.5.  Lactic acid of 2, alkaline phosphatase of 141, AST 39, ALT of 42, total bilirubin of 0.7.    ASSESSMENT:  A 72 year old male with a history of pancreatic cancer in 2012.  He now has sepsis.  He also has anemia.    PLAN:  1.  The patient is awaiting to have scan done.  He will likely need a CT scan since he is not able to get an MRI done because he has an open safety pain in his lung  for unclear reason.  We will continue with antibiotics for now.  We will do an anemia workup with iron studies, vitamin B12, folate and SPEP. We will also check his CA19-9.    2.  The patient will need genetic testing to be done as an outpatient because of his strong family history of pancreatic cancer.    Thank you for requesting consultation on this patient, will follow the patient with you.      ___________________  Jonette Eva M.D.  Dictated By:.   SB  D:01/18/2018 09:52:54  T: 01/18/2018 10:56:20  [id number]

## 2018-01-18 NOTE — Progress Notes (Signed)
Problem: Pain  Goal: *Control of Pain  Outcome: Progressing Towards Goal     Problem: Falls - Risk of  Goal: *Absence of Falls  Description  Document Tommy Brown Fall Risk and appropriate interventions in the flowsheet.  Outcome: Progressing Towards Goal  Note: Fall Risk Interventions:            Medication Interventions: Bed/chair exit alarm, Patient to call before getting OOB         History of Falls Interventions: Bed/chair exit alarm

## 2018-01-18 NOTE — Progress Notes (Signed)
Progress Note    Patient: Tommy Rutter Sr. Age: 72 y.o. Sex: male    Date of Birth: 1945-03-23 Admit Date: 01/17/2018 PCP: None   MRN: [id number]  CSN: [id number]       Author: Hilton Cork. Tuwana Kapaun, MD  Service: [facility]  Pager: [phone]  Date: January 18, 2018    Interval Update:     - Feels better  - Afebrile; Vitals stable  - Order chest CT to look for foreign objects  - MRCP tomorrow if none seen and patient agrees  - Order ECHO for murmur    - Continue IV antibiotics for sesps  - Continue medications for CAD  - Continue medications for HTN  - Continue LWMH for DVT prophylaxis    Lab Trends:     CO2: 20 ? 23  AG: 9 ? 6  Cr: 1.5 ? 1.5  Lactate: 2.0 ? 1.0 ? 1.0 ? 1.2  WBC: 7.4 ? 6.6  Hb: 10.1 ? 8.7    Assessment:     Sepsis likely from intra-abdominal source  Acute kidney injury  Metabolic acidosis  Elevated LFT  Hyperglycemia    ? stricturing of hepaticojejunostomy     Additional Diagnoses:     Hypertension  Coronary artery disease status post CABG  Pancreatic cancer status post Whipple surgery  BPH    Hx: Kidney stones    Medication Orders:     1. Zosyn IV, Toradol IV PRN  2. ASA PO, Lopressor PO, Norvasc PO  3. allopurinol PO, Flomax PO, Neurontin PO, Zanaflex PO  4. Lovenox SC    Medication List:     allopurinol (Zyloprim) tablet 100 mg PO qD  amlodipine (Norvasc) tablet 5 mg PO qD  aspirin (ASA) tablet 81 mg PO qD  enoxaparin (Lovenox) injection 40 mg SC q24H  gabapentin (Neurontin) capsule 600 mg PO BID  metoprolol tartrate (Lopressor) tablet 25 mg PO qD  piperacillin-tazobactam (Zosyn) 3.375 g IV q8H  tamsulosin (Flomax) capsule 0.4 mg PO qD  tizanidine (Zanaflex) tablet 4 mg PO qHS    acetaminophen (Tylenol) tablet 650 mg PO q6H PRN  albuterol (Proventil) nebulizer solution 2.5 mg NEB q6H PRN  alum-mag hydroxide-simeth (Mylanta) oral suspension 30 ml PO q4H PRN  hydrocodone-acetaminophen (Norco) 5-325 mg per tablet 1 tab PO q4H PRN  ketorolac (Toradol) injection 15 mg IV q6H PRN   morphine injection 1 mg IV q4H PRN    Physical Exam:     VITALS: Tmax 101. BP controlled. HR normal. RR normal. O2 > 98%. Room air. I/O neutral  GENERAL: Appears well. No distress  HEART: Regular rate and rhythm. No S3  LUNGS: Normal effort. CTA. No crackles. No wheezing  ABD: Soft. Non-distended. Non-tender  SKIN: Warm. No significant edema  NEURO: Alert. Oriented    Lab Results:     11/24: Na 141, K 4.8, CO2 23, AG 6, BUN 21, Cr 1.5, Ca 8.3  11/24: WBC 6.6, Hb 8.7, Hct 29.7, Plts 101, MCV 87.1, N% 67.2, L% 17.1  11/23: U/A: SG 1.020, pH 5.5, Prot -, Bld -, LE -, NI -  11/23: Na 140, K 4.1, CO2 20, AG 9, BUN 26, Cr 1.5, Ca 9.0  11/23: TBili 0.7, ALT 42, AST 39, A0 141, TP 8.1, Alb 3.8  11/23: WBC 7.4, Hb 10.1, Hct 32.5, Plts 141, MCV 82.7, N% 83.4, L% 6.8  11/23: Procalcitonin 0.42    Radiology:     XR CHEST SNGL V (01/17/18 17:11)  No acute cardiopulmonary process.    Cardiology:     None    Microbiology:     CULTURE, URINE (01/17/18 17:11)    No Growth    CULTURE, BLOOD (01/17/18 16:11)    No Growth    CULTURE, BLOOD (01/17/18 16:11)    No Growth    Other Data:     None    Admission HPI:     Tommy Brown is a 72 y.o. WHITE OR CAUCASIAN male who presents with fever, chills generalized myalgias and body aches.Patient has history of chronic cancer status post Whipple surgery more than 7 years ago and has history of recurrent episodes of the above symptoms every 2 months for which he is to be admitted to the hospital and receive IV antibiotics. He has been seen by his oncologist, primary care physician who has spoken with his immunologist and has told him that he has a small microperforation at the site of his surgery and has infection with needs to be treated. He used to live in [place], [place] and is now moved to this area since August to be close to the daughter in [place] and is trying to establish care in this area.     Patient had pancreatic cancer at the age of 27 years, had Whipple's done in 2006, surgery was done at [facility] in [place], [place] followed this with chemoradiation which she did not finish and could not tolerate and took only half the dose and stopped last time which she received was 7 years ago. He follows with his oncologist once a year who does CT scans and blood work and last CT scan was done in May 2019 which he was reported to be negative. He also follows up with his family doctor Dr.'s Deforest Hoyles, who has researched and spoken with immunologist who he has seen and told that people who have Whipple surgery done they have high likelihood of having recurrent infections in the near the site of surgery in the old pocket and will need to be treated with IV antibiotics all his life every few months.

## 2018-01-18 NOTE — Consults (Addendum)
A member of [facility]                 Consult Note      Patient:  Tommy Rutter Sr.  Date of Birth:  Sep 18, 1945  Age: 72 y.o.  Sex: male  PCP: None  MRN: [id number]      IMPRESSION:     1. Recurrent fever - at risk for recurrent cholangitis if patient has developed stricturing of hepaticojejunostomy (status post Whipple).  Differential diagnosis would include endovascular infections, including endocarditis.  2. Status post Whipple resection Lala Lund, 2006)  3. Coronary artery disease  4. Status post CABG  5. Cardiac murmur  6. History of kidney stones  7. History of carotid endarterectomy    Patient Active Problem List    Diagnosis Date Noted   ??? Sepsis ([place]) 01/17/2018        RECOMMENDATIONS:     ?? IV  hydration with antibiotics  ?? Check blood culture results  ?? MRI with MRCP tomorrow if creatinine decreases.  According to the wife, the patient apparently has not been able to have previous MRI studies because of wires in his chest (?).  If it is deemed dangerous to do MRCP in this patient, CT scan would be recommended.  Dr  Maebelle Munroe will discuss this issue further with his wife and curbside cardiology if necessary.  ?? Consider echocardiography to exclude valvular damage and vegetation  ?? Discussed case with Dr. Tonny Bollman    HPI:     Pleasant 72 year old Caucasian male presents for further evaluation of recurrent fever and chills.  According the patient, has had recurrent chills and fever intermittently every 2 to 3 months for the past 4 years.  He underwent a Whipple resection at [facility] in 2006.  His wife states that at least one blood culture had Klebsiella bacteria.  He denies any dark urine.  His history of rigor is impressive.  Patient does have a heart murmur and states that he underwent echocardiography several years ago.    Past Medical History:   Diagnosis Date   ??? Hx of CABG    ??? Kidney stones    ??? Pancreatic cancer [facility])        Past Surgical History:    Procedure Laterality Date   ??? HX ORTHOPAEDIC      back     No Known Allergies    Prior to Admission medications    Medication Sig Start Date End Date Taking? Authorizing Provider   metoprolol tartrate (LOPRESSOR) 25 mg tablet Take 25 mg by mouth daily.   Yes Other, Phys, MD   gabapentin (NEURONTIN) 600 mg tablet Take 600 mg by mouth two (2) times a day.   Yes Other, Phys, MD   amLODIPine (NORVASC) 5 mg tablet Take 5 mg by mouth daily.   Yes Other, Phys, MD   allopurinol (ZYLOPRIM) 300 mg tablet Take  by mouth daily.   Yes Other, Phys, MD   tamsulosin (FLOMAX) 0.4 mg capsule Take 0.4 mg by mouth daily.   Yes Other, Phys, MD   lipase-protease-amylase (ZENPEP) 20,000-63,000- 84,000 unit capsule Take 8 Caps by mouth daily.   Yes Other, Phys, MD   tiZANidine (ZANAFLEX) 4 mg tablet Take 4 mg by mouth nightly.   Yes Other, Phys, MD   fluticasone propionate 100 mcg/actuation dsdv Take  by inhalation.   Yes Other, Phys, MD   aspirin 81 mg chewable tablet Take 81 mg  by mouth daily.   Yes Other, Phys, MD   TESTOSTERONE IM by IntraMUSCular route.   Yes Other, Phys, MD       Current Facility-Administered Medications   Medication Dose Route Frequency   ??? allopurinol (ZYLOPRIM) tablet 100 mg  100 mg Oral DAILY   ??? amLODIPine (NORVASC) tablet 5 mg  5 mg Oral DAILY   ??? aspirin chewable tablet 81 mg  81 mg Oral DAILY   ??? gabapentin (NEURONTIN) capsule 600 mg  600 mg Oral BID   ??? metoprolol tartrate (LOPRESSOR) tablet 25 mg  25 mg Oral DAILY   ??? tamsulosin (FLOMAX) capsule 0.4 mg  0.4 mg Oral DAILY   ??? tiZANidine (ZANAFLEX) tablet 4 mg  4 mg Oral QHS   ??? sodium chloride (NS) flush 5-10 mL  5-10 mL IntraVENous Q8H   ??? sodium chloride (NS) flush 5-10 mL  5-10 mL IntraVENous PRN   ??? naloxone (NARCAN) injection 0.1 mg  0.1 mg IntraVENous PRN   ??? acetaminophen (TYLENOL) tablet 650 mg  650 mg Oral Q6H PRN   ??? HYDROcodone-acetaminophen (NORCO) 5-325 mg per tablet 1 Tab  1 Tab Oral Q4H PRN    ??? morphine injection 1 mg  1 mg IntraVENous Q4H PRN   ??? alum-mag hydroxide-simeth (MYLANTA) oral suspension 30 mL  30 mL Oral Q4H PRN   ??? albuterol (PROVENTIL VENTOLIN) nebulizer solution 2.5 mg  2.5 mg Nebulization Q6H PRN   ??? piperacillin-tazobactam (ZOSYN) 3.375 g in 0.9% sodium chloride (MBP/ADV) 100 mL MBP  3.375 g IntraVENous Q8H   ??? enoxaparin (LOVENOX) injection 40 mg  40 mg SubCUTAneous Q24H       History reviewed. No pertinent family history.    Social History     Tobacco Use   ??? Smoking status: Never Smoker   ??? Smokeless tobacco: Never Used   Substance Use Topics   ??? Alcohol use: Not on file       Review of Systems:   Constitutional: No fever, chills, or weight loss  Eyes: No visual symptoms.  ENT: No sore throat, runny nose or ear pain.  Respiratory: No cough, dyspnea or wheezing.  Cardiovascular: No chest pain, pressure, palpitations, tightness or heaviness.  Gastrointestinal: Patient denies dark urine or abdominal pain  Genitourinary: No dysuria, frequency, or urgency.  Musculoskeletal: No joint pain or swelling.  Integumentary: No rashes.  Neurological: No headaches, sensory or motor symptoms.  Denies complaints in all other systems.    Physical Exam:     Patient Vitals for the past 12 hrs:   Temp Pulse Resp BP SpO2   01/18/18 0825 99.4 ??F (37.4 ??C) (!) 59 20 130/60 96 %   01/18/18 0532 99.1 ??F (37.3 ??C) (!) 59 18 120/60 95 %   01/18/18 0003 99.1 ??F (37.3 ??C) 61 18 106/58 93 %   01/18/18 0000 ??? 76 ??? ??? ???     Constitutional: Patient appears well developed and well nourished. Marland Kitchen Appearance and behavior are age and situation appropriate.  HEENT: Conjunctiva clear.  Mucous membranes moist, non-erythematous. Surface of the pharynx, palate, and tongue are pink, moist and without lesions.  Neck: supple, non tender, symmetrical, no masses or JVD.   Respiratory: lungs clear to auscultation, nonlabored respirations. No tachypnea or accessory muscle use.   Cardiovascular: heart regular rate with 4 out of 6 systolic ejection murmur, left upper sternal border, without radiation  Gastrointestinal: Soft, nontender, active bowel sounds  Musculoskeletal: Nail beds pink with prompt capillary refill  Integumentary: warm and dry without rashes or lesions  Extremities: No edema,   Neurologic: alert and oriented, Sensation intact, motor strength equal and symmetric.  No facial asymmetry or dysarthria.    Laboratory / Diagnostic Results:     CBC w/Diff   Recent Labs     01/18/18  0509 01/17/18  1634   WBC 6.6 7.4   HGB 8.7* 10.1*   HCT 29.7* 32.5*   MCV 87.1 82.7   PLT 101* 141        Hepatic Function   Recent Labs     01/17/18  1634   SGOT 39*   ALT 42   AP 141*   TBILI 0.7   ALB 3.8   TP 8.1        Pancreatic Enzymes   No results for input(s): AML, LPSE in the last 72 hours.     Basic Metabolic Profile   Recent Labs     01/18/18  0509 01/17/18  1634   NA 141 140   K 4.8 4.1   CL 112* 111*   CO2 23 20*   BUN 21 26*   GLU 119* 155*   CREA 1.5* 1.5*   CA 8.3* 9.0        Coagulation   No results for input(s): PTP, INR, APTT, INREXT in the last 72 hours.     Radiology:  Xr Chest Sngl V    Result Date: 01/17/2018  EXAM:  Chest AP INDICATIONS: sepsis COMPARISON: None. FINDINGS: No consolidation, pleural effusion, or pulmonary edema. No pneumothorax. Normal cardiomediastinal contours. Median sternotomy wires and changes post-CABG.     IMPRESSION: No acute cardiopulmonary process.        Thank you for the chance to participate in the care of this patient.      Ciro Backer. Eugenia Pancoast, MD  [facility]  Cell [place] [phone]  [place] Office:  [phone]

## 2018-01-18 NOTE — Progress Notes (Signed)
Problem: Pain  Goal: *Control of Pain  Outcome: Progressing Towards Goal     Problem: Falls - Risk of  Goal: *Absence of Falls  Description  Document Schmid Fall Risk and appropriate interventions in the flowsheet.  Outcome: Progressing Towards Goal  Note: Fall Risk Interventions:            Medication Interventions: Bed/chair exit alarm

## 2018-01-18 NOTE — Progress Notes (Signed)
Patient has declined the MRI checklist questions.Said we forget about the MRI he cant have it he has a safety pin in his lungs.

## 2018-01-18 NOTE — Consults (Signed)
Consults  by Nile Dear, MD at 01/18/18 [phone]                Author: Nile Dear, MD  Service: Gastroenterology  Author Type: Physician       Filed: 01/18/18 1353  Date of Service: 01/18/18 0923  Status: Addendum          Editor: Nile Dear, MD (Physician)          Related Notes: Original Note by Nile Dear, MD (Physician) filed at 01/18/18 1351                       A member of [facility], [facility]                   Consult Note         Patient:  Tommy Rutter Sr.   Date of Birth:  September 07, 1945   Age: 72 y.o.   Sex: male   PCP: None   MRN: [id number]           IMPRESSION:        1.  Recurrent fever - at risk for recurrent cholangitis if patient has developed stricturing of hepaticojejunostomy (status post Whipple).  Differential  diagnosis would include endovascular infections, including endocarditis.   2.  Status post Whipple resection Lala Lund, 2006)   3.  Coronary artery disease   4.  Status post CABG   5.  Cardiac murmur   6.  History of kidney stones   7.  History of carotid endarterectomy        Patient Active Problem List           Diagnosis  Date Noted         ?  Sepsis ([place])  01/17/2018              RECOMMENDATIONS:        ??  IV  hydration with antibiotics   ??  Check blood culture results   ??  MRI with MRCP tomorrow if creatinine decreases.  According to the wife, the patient apparently has not been able to have previous MRI studies because of wires in his chest (?).  If it is deemed dangerous to do MRCP in this patient, CT scan would be recommended.   Dr  Maebelle Munroe will discuss this issue further with his wife and curbside cardiology if necessary.   ??  Consider echocardiography to exclude valvular damage and vegetation   ??  Discussed case with Dr. Tonny Bollman        HPI:        Pleasant 72 year old Caucasian male presents for further evaluation of recurrent fever and chills.  According the patient, has had recurrent chills and fever  intermittently every 2 to 3 months for the past 4 years.  He underwent a Whipple resection at  [facility] in 2006.  His wife states that at least one blood culture had Klebsiella bacteria.  He denies any dark urine.  His history of rigor is impressive.  Patient does have a heart murmur and states that he underwent echocardiography several  years ago.        Past Medical History:        Diagnosis  Date         ?  Hx of CABG       ?  Kidney stones           ?  Pancreatic cancer [facility])               Past Surgical History:         Procedure  Laterality  Date          ?  HX ORTHOPAEDIC              back        No Known Allergies        Prior to Admission medications             Medication  Sig  Start Date  End Date  Taking?  Authorizing Provider            metoprolol tartrate (LOPRESSOR) 25 mg tablet  Take 25 mg by mouth daily.      Yes  Other, Phys, MD     gabapentin (NEURONTIN) 600 mg tablet  Take 600 mg by mouth two (2) times a day.      Yes  Other, Phys, MD     amLODIPine (NORVASC) 5 mg tablet  Take 5 mg by mouth daily.      Yes  Other, Phys, MD     allopurinol (ZYLOPRIM) 300 mg tablet  Take  by mouth daily.      Yes  Other, Phys, MD     tamsulosin (FLOMAX) 0.4 mg capsule  Take 0.4 mg by mouth daily.      Yes  Other, Phys, MD     lipase-protease-amylase (ZENPEP) 20,000-63,000- 84,000 unit capsule  Take 8 Caps by mouth daily.      Yes  Other, Phys, MD     tiZANidine (ZANAFLEX) 4 mg tablet  Take 4 mg by mouth nightly.      Yes  Other, Phys, MD     fluticasone propionate 100 mcg/actuation dsdv  Take  by inhalation.      Yes  Other, Phys, MD     aspirin 81 mg chewable tablet  Take 81 mg by mouth daily.      Yes  Other, Phys, MD            TESTOSTERONE IM  by IntraMUSCular route.      Yes  Other, Phys, MD             Current Facility-Administered Medications          Medication  Dose  Route  Frequency           ?  allopurinol (ZYLOPRIM) tablet 100 mg   100 mg  Oral  DAILY     ?  amLODIPine (NORVASC) tablet 5 mg    5 mg  Oral  DAILY     ?  aspirin chewable tablet 81 mg   81 mg  Oral  DAILY     ?  gabapentin (NEURONTIN) capsule 600 mg   600 mg  Oral  BID     ?  metoprolol tartrate (LOPRESSOR) tablet 25 mg   25 mg  Oral  DAILY     ?  tamsulosin (FLOMAX) capsule 0.4 mg   0.4 mg  Oral  DAILY     ?  tiZANidine (ZANAFLEX) tablet 4 mg   4 mg  Oral  QHS     ?  sodium chloride (NS) flush 5-10 mL   5-10 mL  IntraVENous  Q8H     ?  sodium chloride (NS) flush 5-10 mL   5-10 mL  IntraVENous  PRN     ?  naloxone (NARCAN) injection 0.1  mg   0.1 mg  IntraVENous  PRN     ?  acetaminophen (TYLENOL) tablet 650 mg   650 mg  Oral  Q6H PRN     ?  HYDROcodone-acetaminophen (NORCO) 5-325 mg per tablet 1 Tab   1 Tab  Oral  Q4H PRN     ?  morphine injection 1 mg   1 mg  IntraVENous  Q4H PRN     ?  alum-mag hydroxide-simeth (MYLANTA) oral suspension 30 mL   30 mL  Oral  Q4H PRN     ?  albuterol (PROVENTIL VENTOLIN) nebulizer solution 2.5 mg   2.5 mg  Nebulization  Q6H PRN     ?  piperacillin-tazobactam (ZOSYN) 3.375 g in 0.9% sodium chloride (MBP/ADV) 100 mL MBP   3.375 g  IntraVENous  Q8H           ?  enoxaparin (LOVENOX) injection 40 mg   40 mg  SubCUTAneous  Q24H           History reviewed. No pertinent family history.        Social History          Tobacco Use         ?  Smoking status:  Never Smoker     ?  Smokeless tobacco:  Never Used       Substance Use Topics         ?  Alcohol use:  Not on file             Review of Systems:     Constitutional: No fever, chills, or weight loss   Eyes: No visual symptoms.   ENT: No sore throat, runny nose or ear pain.   Respiratory: No cough, dyspnea or wheezing.   Cardiovascular: No chest pain, pressure, palpitations, tightness or heaviness.   Gastrointestinal: Patient denies dark urine or abdominal pain   Genitourinary: No dysuria, frequency, or urgency.   Musculoskeletal: No joint pain or swelling.   Integumentary: No rashes.   Neurological: No headaches, sensory or motor symptoms.   Denies complaints in all  other systems.        Physical Exam:        Patient Vitals for the past 12 hrs:            Temp  Pulse  Resp  BP  SpO2            01/18/18 0825  99.4 ??F (37.4 ??C)  (!) 59  20  130/60  96 %            01/18/18 0532  99.1 ??F (37.3 ??C)  (!) 59  18  120/60  95 %     01/18/18 0003  99.1 ??F (37.3 ??C)  61  18  106/58  93 %            01/18/18 0000  --  76  --  --  --        Constitutional: Patient appears well developed and well nourished. Marland Kitchen Appearance and behavior are age and situation appropriate.   HEENT: Conjunctiva clear.  Mucous membranes moist, non-erythematous. Surface of the pharynx, palate, and tongue are pink, moist and without  lesions.   Neck: supple, non tender, symmetrical, no masses or JVD.    Respiratory: lungs clear to auscultation, nonlabored respirations. No tachypnea or accessory muscle use.   Cardiovascular: heart regular rate with 4 out of 6 systolic ejection murmur, left upper sternal border, without  radiation   Gastrointestinal: Soft, nontender, active bowel sounds   Musculoskeletal: Nail beds pink with prompt capillary refill   Integumentary: warm and dry without rashes or lesions   Extremities: No edema,    Neurologic: alert and oriented, Sensation intact, motor strength equal and symmetric.  No facial asymmetry or dysarthria.        Laboratory / Diagnostic Results:          CBC w/Diff     Recent Labs         01/18/18   0509  01/17/18   1634      WBC  6.6  7.4      HGB  8.7*  10.1*      HCT  29.7*  32.5*      MCV  87.1  82.7      PLT  101*  141                  Hepatic Function     Recent Labs         01/17/18   1634      SGOT  39*      ALT  42      AP  141*      TBILI  0.7      ALB  3.8      TP  8.1                  Pancreatic Enzymes       No results for input(s): AML, LPSE in the last 72 hours.          Basic Metabolic Profile     Recent Labs         01/18/18   0509  01/17/18   1634      NA  141  140      K  4.8  4.1      CL  112*  111*      CO2  23  20*      BUN  21  26*      GLU  119*  155*       CREA  1.5*  1.5*      CA  8.3*  9.0                  Coagulation       No results for input(s): PTP, INR, APTT, INREXT in the last 72 hours.        Radiology:   Xr Chest Sngl V      Result Date: 01/17/2018   EXAM:  Chest AP INDICATIONS: sepsis COMPARISON: None. FINDINGS: No consolidation, pleural effusion, or pulmonary edema. No pneumothorax. Normal cardiomediastinal contours. Median sternotomy wires and changes post-CABG.       IMPRESSION: No acute cardiopulmonary process.           Thank you for the chance to participate in the care of this patient.         Ciro Backer. Eugenia Pancoast, MD   [facility]   Cell [place] [phone]   [place] Office:  [phone]

## 2018-01-18 NOTE — Progress Notes (Signed)
Progress Note    Patient: Tommy Brown. Age: 72 y.o. Sex: male    Date of Birth: 12/12/1945 Admit Date: 01/17/2018 PCP: None   MRN: [id number]  CSN: [id number]       Author: Hilton Cork. Ruston Fedora, MD  Service: [facility]  Pager: [phone]  Date: January 18, 2018    Interval Update:     - Feels better  - Afebrile; Vitals stable  - Order chest CT to look for foreign objects  - MRCP tomorrow if none seen and patient agrees  - Order ECHO for murmur    - Continue IV antibiotics for sesps  - Continue medications for CAD  - Continue medications for HTN  - Continue LWMH for DVT prophylaxis    Lab Trends:     CO2: 20 ? 23  AG: 9 ? 6  Cr: 1.5 ? 1.5  Lactate: 2.0 ? 1.0 ? 1.0 ? 1.2  WBC: 7.4 ? 6.6  Hb: 10.1 ? 8.7    Assessment:     Sepsis likely from intra-abdominal source  Acute kidney injury  Metabolic acidosis  Elevated LFT  Hyperglycemia    ? stricturing of hepaticojejunostomy     Additional Diagnoses:     Hypertension  Coronary artery disease status post CABG  Pancreatic cancer status post Whipple surgery  BPH    Hx: Kidney stones    Medication Orders:     1. Zosyn IV, Toradol IV PRN  2. ASA PO, Lopressor PO, Norvasc PO  3. allopurinol PO, Flomax PO, Neurontin PO, Zanaflex PO  4. Lovenox SC    Medication List:     allopurinol (Zyloprim) tablet 100 mg PO qD  amlodipine (Norvasc) tablet 5 mg PO qD  aspirin (ASA) tablet 81 mg PO qD  enoxaparin (Lovenox) injection 40 mg SC q24H  gabapentin (Neurontin) capsule 600 mg PO BID  metoprolol tartrate (Lopressor) tablet 25 mg PO qD  piperacillin-tazobactam (Zosyn) 3.375 g IV q8H  tamsulosin (Flomax) capsule 0.4 mg PO qD  tizanidine (Zanaflex) tablet 4 mg PO qHS    acetaminophen (Tylenol) tablet 650 mg PO q6H PRN  albuterol (Proventil) nebulizer solution 2.5 mg NEB q6H PRN  alum-mag hydroxide-simeth (Mylanta) oral suspension 30 ml PO q4H PRN  hydrocodone-acetaminophen (Norco) 5-325 mg per tablet 1 tab PO q4H PRN  ketorolac (Toradol) injection 15 mg IV q6H PRN  morphine  injection 1 mg IV q4H PRN    Physical Exam:     VITALS: Tmax 101. BP controlled. HR normal. RR normal. O2 > 98%. Room air. I/O neutral  GENERAL: Appears well. No distress  HEART: Regular rate and rhythm. No S3  LUNGS: Normal effort. CTA. No crackles. No wheezing  ABD: Soft. Non-distended. Non-tender  SKIN: Warm. No significant edema  NEURO: Alert. Oriented    Lab Results:     11/24: Na 141, K 4.8, CO2 23, AG 6, BUN 21, Cr 1.5, Ca 8.3  11/24: WBC 6.6, Hb 8.7, Hct 29.7, Plts 101, MCV 87.1, N% 67.2, L% 17.1  11/23: U/A: SG 1.020, pH 5.5, Prot -, Bld -, LE -, NI -  11/23: Na 140, K 4.1, CO2 20, AG 9, BUN 26, Cr 1.5, Ca 9.0  11/23: TBili 0.7, ALT 42, AST 39, A0 141, TP 8.1, Alb 3.8  11/23: WBC 7.4, Hb 10.1, Hct 32.5, Plts 141, MCV 82.7, N% 83.4, L% 6.8  11/23: Procalcitonin 0.42    Radiology:     XR CHEST SNGL V (01/17/18 17:11)  No acute cardiopulmonary process.    Cardiology:     None    Microbiology:     CULTURE, URINE (01/17/18 17:11)    No Growth    CULTURE, BLOOD (01/17/18 16:11)    No Growth    CULTURE, BLOOD (01/17/18 16:11)    No Growth    Other Data:     None    Admission HPI:     Tommy Brown is a 72 y.o. WHITE OR CAUCASIAN male who presents with fever, chills generalized myalgias and body aches.Patient has history of chronic cancer status post Whipple surgery more than 7 years ago and has history of recurrent episodes of the above symptoms every 2 months for which he is to be admitted to the hospital and receive IV antibiotics. He has been seen by his oncologist, primary care physician who has spoken with his immunologist and has told him that he has a small microperforation at the site of his surgery and has infection with needs to be treated. He used to live in [place], [place] and is now moved to this area since August to be close to the daughter in [place] and is trying to establish care in this area.    Patient had pancreatic cancer at the age of 5 years, had Whipple's done in 2006,  surgery was done at [facility] in [place], [place] followed this with chemoradiation which she did not finish and could not tolerate and took only half the dose and stopped last time which she received was 7 years ago. He follows with his oncologist once a year who does CT scans and blood work and last CT scan was done in May 2019 which he was reported to be negative. He also follows up with his family doctor Dr.'s Deforest Hoyles, who has researched and spoken with immunologist who he has seen and told that people who have Whipple surgery done they have high likelihood of having recurrent infections in the near the site of surgery in the old pocket and will need to be treated with IV antibiotics all his life every few months.

## 2018-01-18 NOTE — Consults (Signed)
[facility] REPORT  NAME:  Deborra Medina  SEX:   M  ADMIT: 01/17/2018  DATE OF CONSULT: 01/18/2018  REFERRING PHYSICIAN:    DOB: October 20, 1945  MR#    [id number]  ROOM:  [id number]  ACCT#  [id number]    cc: Cheri Guppy MD    HISTORY OF PRESENT ILLNESS:  The patient is a 72 year old male who apparently was treated at [facility], [place] in 2012 for pancreatic cancer.  He underwent Whipple surgery and received adjuvant chemoradiation after the surgery.  The patient was not able to complete his adjuvant course of chemoradiation because of toxicity.  He only received half of the planned treatment.      However, the patient has actually done well from a surgical standpoint.  He has not had any evidence of recurrence.      However, the patient did have significant problems from the surgery itself and has had recurrent episodes of sepsis, which reportedly is due to an infected pouch.  The patient claims that he has an infection that happens about once a year.      The patient has now moved to the [place] area.  He had sudden onset of abdominal pain and fever and chills.  He came in to be admitted.  He is now started on IV antibiotics.    The patient is now referred for evaluation because of his history of pancreatic cancer.    REVIEW OF SYSTEMS:  The patient is weak.  The patient had fever, chills or night sweats but now better.  Has appetite had been good except recently.  Weight has been stable.  No headaches, dizziness, diplopia.  No mouth sores or dysphagia.  No hemoptysis, pleurisy, wheezing.  No angina, palpitation, no syncope.  Abdominal pain is now better.  Has no rectal bleeding, melena, hematemesis.  No dysuria, hematuria, no incontinence.  No delusion or hallucination, no seizure.    PAST MEDICAL HISTORY:  Coronary artery disease, kidney cancer, pancreatic cancer.    PAST SURGICAL HISTORY:  CABG, C-spine surgery, carotid endarterectomy, Whipple procedure.    SOCIAL  HISTORY:  The patient is a nonsmoker, no alcohol abuse.    FAMILY HISTORY:  One brother had pancreatic cancer in his 45s.  Another brother had pancreatic cancer at the age of 95.    CURRENT MEDICATIONS:  Includes allopurinol, Norvasc, aspirin, subcutaneous Lovenox, Neurontin, metoprolol, Zosyn, Flomax and Zanaflex.    PHYSICAL EXAMINATION:  GENERAL:  Elderly male, looks comfortable and not in respiratory distress.  HEENT:  PERRLA, anicteric sclerae, no oral thrush, no mucositis.  CHEST AND LUNGS:  Clear breath sounds.  No rales, no wheezing.  HEART:  S1, S2, no murmur.  ABDOMEN:  Soft, nontender, no hepatosplenomegaly.  EXTREMITIES:  No pedal edema.  NEUROLOGIC:  No focal deficits.    LABORATORY DATA:  WBC 6.6, hemoglobin 8.7, hematocrit 30, MCV of 87, platelet count of 101,000.  Sodium 141, potassium 4.8, BUN of 21, creatinine 1.5.  Lactic acid of 2, alkaline phosphatase of 141, AST 39, ALT of 42, total bilirubin of 0.7.    ASSESSMENT:  A 72 year old male with a history of pancreatic cancer in 2012.  He now has sepsis.  He also has anemia.    PLAN:  1.  The patient is awaiting to have scan done.  He will likely need a CT scan since he is not able to get an MRI done because he has an open safety pain in his lung  for unclear reason.  We will continue with antibiotics for now.  We will do an anemia workup with iron studies, vitamin B12, folate and SPEP. We will also check his CA19-9.    2.  The patient will need genetic testing to be done as an outpatient because of his strong family history of pancreatic cancer.    Thank you for requesting consultation on this patient, will follow the patient with you.      ___________________  Jonette Eva M.D.  Dictated By:.   SB  D:01/18/2018 09:52:54  T: 01/18/2018 10:56:20  [id number]

## 2018-01-19 ENCOUNTER — Inpatient Hospital Stay: Admit: 2018-01-19 | Payer: MEDICARE | Primary: Family Medicine

## 2018-01-19 LAB — IRON PROFILE
Iron % saturation: 6 % — ABNORMAL LOW (ref 20–45)
Iron: 15 ug/dL — ABNORMAL LOW (ref 65–175)
TIBC: 257 ug/dL (ref 250–450)

## 2018-01-19 LAB — METABOLIC PANEL, BASIC
Anion gap: 6 mmol/L (ref 5–15)
BUN: 18 mg/dl (ref 7–25)
CO2: 23 mEq/L (ref 21–32)
Calcium: 8.5 mg/dl (ref 8.5–10.1)
Chloride: 108 mEq/L — ABNORMAL HIGH (ref 98–107)
Creatinine: 1.5 mg/dl — ABNORMAL HIGH (ref 0.6–1.3)
GFR est AA: 59
GFR est non-AA: 49
Glucose: 121 mg/dl — ABNORMAL HIGH (ref 74–106)
Potassium: 4.2 mEq/L (ref 3.5–5.1)
Sodium: 138 mEq/L (ref 136–145)

## 2018-01-19 LAB — RHEUMATOID FACTOR, QL: Rheumatoid factor: <10 IU/ml (ref 0–14)

## 2018-01-19 LAB — FERRITIN
Ferritin: 58.2 ng/ml (ref 26.0–388.0)
Ferritin: 58.2 ng/ml (ref 26.0–388.0)

## 2018-01-19 LAB — CBC WITH AUTOMATED DIFF
BASOPHILS: 0.1 % (ref 0–3)
EOSINOPHILS: 2.2 % (ref 0–5)
HCT: 28.4 % — ABNORMAL LOW (ref 37.0–50.0)
HGB: 8.5 gm/dl — ABNORMAL LOW (ref 12.4–17.2)
IMMATURE GRANULOCYTES: 0.3 % (ref 0.0–3.0)
LYMPHOCYTES: 17.4 % — ABNORMAL LOW (ref 28–48)
MCH: 25.3 pg (ref 23.0–34.6)
MCHC: 29.9 gm/dl — ABNORMAL LOW (ref 30.0–36.0)
MCV: 84.5 fL (ref 80.0–98.0)
MONOCYTES: 11.8 % (ref 1–13)
MPV: 11.3 fL — ABNORMAL HIGH (ref 6.0–10.0)
NEUTROPHILS: 68.2 % — ABNORMAL HIGH (ref 34–64)
NRBC: 0 (ref 0–0)
PLATELET: 104 10*3/uL — ABNORMAL LOW (ref 140–450)
RBC: 3.36 M/uL — ABNORMAL LOW (ref 3.80–5.70)
RDW-SD: 59 — ABNORMAL HIGH (ref 35.1–43.9)
WBC: 7 10*3/uL (ref 4.0–11.0)

## 2018-01-19 LAB — SED RATE (ESR): Sed rate (ESR): 55 mm/Hr — ABNORMAL HIGH (ref 0–15)

## 2018-01-19 LAB — CULTURE, URINE
CULTURE RESULT: NO GROWTH
Culture result: NO GROWTH

## 2018-01-19 LAB — C REACTIVE PROTEIN, QT: C-Reactive protein: 104 mg/L — ABNORMAL HIGH (ref 0.0–2.9)

## 2018-01-19 LAB — CBC WITH AUTO DIFFERENTIAL
Basophils %: 0.1 % (ref 0–3)
Eosinophils %: 2.2 % (ref 0–5)
Hematocrit: 28.4 % — ABNORMAL LOW (ref 37.0–50.0)
Hemoglobin: 8.5 gm/dl — ABNORMAL LOW (ref 12.4–17.2)
Immature Granulocytes: 0.3 % (ref 0.0–3.0)
Lymphocytes %: 17.4 % — ABNORMAL LOW (ref 28–48)
MCH: 25.3 pg (ref 23.0–34.6)
MCHC: 29.9 gm/dl — ABNORMAL LOW (ref 30.0–36.0)
MCV: 84.5 fL (ref 80.0–98.0)
MPV: 11.3 fL — ABNORMAL HIGH (ref 6.0–10.0)
Monocytes %: 11.8 % (ref 1–13)
Neutrophils %: 68.2 % — ABNORMAL HIGH (ref 34–64)
Nucleated RBCs: 0 (ref 0–0)
Platelets: 104 10*3/uL — ABNORMAL LOW (ref 140–450)
RBC: 3.36 M/uL — ABNORMAL LOW (ref 3.80–5.70)
RDW-SD: 59 — ABNORMAL HIGH (ref 35.1–43.9)
WBC: 7 10*3/uL (ref 4.0–11.0)

## 2018-01-19 LAB — BASIC METABOLIC PANEL
Anion Gap: 6 mmol/L (ref 5–15)
BUN: 18 mg/dl (ref 7–25)
CO2: 23 mEq/L (ref 21–32)
Calcium: 8.5 mg/dl (ref 8.5–10.1)
Chloride: 108 mEq/L — ABNORMAL HIGH (ref 98–107)
Creatinine: 1.5 mg/dl — ABNORMAL HIGH (ref 0.6–1.3)
EGFR IF NonAfrican American: 49
GFR African American: 59
Glucose: 121 mg/dl — ABNORMAL HIGH (ref 74–106)
Potassium: 4.2 mEq/L (ref 3.5–5.1)
Sodium: 138 mEq/L (ref 136–145)

## 2018-01-19 LAB — C-REACTIVE PROTEIN: CRP: 104 mg/L — ABNORMAL HIGH (ref 0.0–2.9)

## 2018-01-19 LAB — SEDIMENTATION RATE: Sed Rate: 55 mm/Hr — ABNORMAL HIGH (ref 0–15)

## 2018-01-19 LAB — RHEUMATOID FACTOR, QUALITATIVE: Rheumatoid Factor: <10 IU/ml (ref 0–14)

## 2018-01-19 LAB — IRON AND TIBC
Iron Saturation: 6 % — ABNORMAL LOW (ref 20–45)
Iron: 15 ug/dL — ABNORMAL LOW (ref 65–175)
TIBC: 257 ug/dL (ref 250–450)

## 2018-01-19 MED ORDER — LORAZEPAM 2 MG/ML IJ SOLN
2 mg/mL | Freq: Once | INTRAMUSCULAR | Status: AC
Start: 2018-01-19 — End: 2018-01-19
  Administered 2018-01-19: 15:00:00 via INTRAVENOUS

## 2018-01-19 MED ORDER — GADOBUTROL 1 MMOL/ML (604.72 MG/ML) IV
1 mmol/mL (604.72 mg/mL) | Freq: Once | INTRAVENOUS | Status: AC
Start: 2018-01-19 — End: 2018-01-19
  Administered 2018-01-19: 16:00:00 via INTRAVENOUS

## 2018-01-19 MED FILL — LOVENOX 40 MG/0.4 ML SUBCUTANEOUS SYRINGE: 40 mg/0.4 mL | SUBCUTANEOUS | Qty: 0.4

## 2018-01-19 MED FILL — GADAVIST 1 MMOL/ML (604.72 MG/ML) INTRAVENOUS SOLUTION: 1 mmol/mL (604.72 mg/mL) | INTRAVENOUS | Qty: 7

## 2018-01-19 MED FILL — BD POSIFLUSH NORMAL SALINE 0.9 % INJECTION SYRINGE: INTRAMUSCULAR | Qty: 10

## 2018-01-19 MED FILL — KETOROLAC TROMETHAMINE 30 MG/ML INJECTION: 30 mg/mL (1 mL) | INTRAMUSCULAR | Qty: 1

## 2018-01-19 MED FILL — TAMSULOSIN SR 0.4 MG 24 HR CAP: 0.4 mg | ORAL | Qty: 1

## 2018-01-19 MED FILL — TIZANIDINE 4 MG TAB: 4 mg | ORAL | Qty: 1

## 2018-01-19 MED FILL — GABAPENTIN 300 MG CAP: 300 mg | ORAL | Qty: 2

## 2018-01-19 MED FILL — ASPIRIN 81 MG CHEWABLE TAB: 81 mg | ORAL | Qty: 1

## 2018-01-19 MED FILL — PIPERACILLIN-TAZOBACTAM 3.375 GRAM IV SOLR: 3.375 gram | INTRAVENOUS | Qty: 3.38

## 2018-01-19 MED FILL — LORAZEPAM 2 MG/ML IJ SOLN: 2 mg/mL | INTRAMUSCULAR | Qty: 1

## 2018-01-19 MED FILL — METOPROLOL TARTRATE 25 MG TAB: 25 mg | ORAL | Qty: 1

## 2018-01-19 MED FILL — TYLENOL 325 MG TABLET: 325 mg | ORAL | Qty: 2

## 2018-01-19 MED FILL — AMLODIPINE 5 MG TAB: 5 mg | ORAL | Qty: 1

## 2018-01-19 MED FILL — ALLOPURINOL 100 MG TAB: 100 mg | ORAL | Qty: 1

## 2018-01-19 NOTE — Other (Signed)
Patient came back from MRI request to eat. Call Dr. Nolen Mu at 1125 to discussed dietary order and patient cardiac monitor status. Received instruction to call GI doctor for diet order and order to [facility] tele. Call NP Amy Joya Gaskins at 1131 regarding dietary order for patient. Received order for regular diet.

## 2018-01-19 NOTE — Consults (Addendum)
INFECTIOUS DISEASE CONSULT NOTE       Requested by: Dr. Nolen Mu    Reason for consult: Fever, recurrent infections    Date of admission: 01/17/2018    Date of consult: January 19, 2018      ABX:     Current abx Prior abx    Zosyn 11/23-2      ASSESSMENT:      Recurrent fever with chills  - no definite source clinically  - ?sec to sec to Whipple's dis   AKI versus CKD   Anemia   Transaminitis-mild   2 Small indeterminate foci of ringlike hepatic enhancement-seen on MRI  - Nonspecific endings  -??  Microabscesses or metastatic lesions vs normal heterogenous process   History of recurrent fever and chills every 2 months  -Started since Whipple surgery and had grown Klebsiella few times  -Per patient secondary to infected pouch  -Previous work-up done with negative results   Pancreatic cancer status post Whipple resection in 2012 and incomplete adjuvant chemotherapy   Hyperglycemia   History of kidney stones   History of carotid endarterectomy   CAD status post CABG   History of C. difficile          RECOMMENDATIONS:     This is very interesting but complicated patient with history of recurrent fever and chills every couple of months since his Whipple surgery 7 years ago    -Patient tells me with history of recurrent bacteremia and also on prophylactic antibiotic for long time without any significant improvement in recurrence of these episodes  -He denies any weight loss, recurrent rash, arthritis.  No risk factors with international travel, pets, IV drug use.  He denies any bug bite mosquito bite.  No occupational risk, currently retired  -Currently afebrile with normal white count   -Currently on Zosyn-should be broad enough pending culture results  -Cause of recurrent fevers seems to be unclear.  Patient has moved from [place] and will need records to review   -MRI reviewed and with nonspecific findings-significance unclear at this time.  Await further input from surgery and GI  -2D echo without any definite valvular lesion.  I will order Coxiella and Brucella antibodies.  We will also order Fungitell  -Need to rule out for connective tissue disorder - will order ESR CRP and complement levels along with ANA  -Watch for any development of diarrhea given history of C. difficile as on broad-spectrum antibiotics and also chronic suppressive antibiotics with?  Keflex  -Avoid nephrotoxic medication and dose for current creatinine clearance  -We will monitor results and decide further plan based on them and clinical response                 MICROBIOLOGY:     11/23 blood culture x2 NTD   Urine culture negative    LINES AND CATHETERS:     piv    HPI:     Tommy Brown is a 72 year old male with past medical history of pancreatic cancer status post Whipple's disease, CABG, kidney stones was admitted on 11/23 with fever and chills.    Patient was diagnosed and treated for pancreatic cancer at [place], [place] in 2012.  He underwent Whipple surgery and received adjuvant chemoradiation after the surgery.  He was not able to complete the adjuvant course given toxicity.  Patient since his surgery has had recurrent episodes of sepsis which was reportedly secondary to an infected pouch.  Patient claims that he has an infection  at least once a year.  He also tells me that has been seen by immunologist and was told that high likelihood of having recurrent infections given old pocket and at risk for recurrent infections.  He recently moved to [place].  He developed sudden onset of abdominal pain along with fever and chills and thought similar to his previous episodes.  He came to ED.  Cultures were drawn.  He was started on broad-spectrum antibiotic with Vanco and Zosyn.  Patient was seen by GI and also oncology.  We were asked for further evaluation management.     Past medical history:     Past Medical History:   Diagnosis Date   ??? Hx of CABG    ??? Kidney stones    ??? Pancreatic cancer [facility])        Past Surgical History:   Procedure Laterality Date   ??? HX ORTHOPAEDIC      back        Social History:     Social History     Socioeconomic History   ??? Marital status: SINGLE     Spouse name: Not on file   ??? Number of children: Not on file   ??? Years of education: Not on file   ??? Highest education level: Not on file   Occupational History   ??? Not on file   Social Needs   ??? Financial resource strain: Not on file   ??? Food insecurity:     Worry: Not on file     Inability: Not on file   ??? Transportation needs:     Medical: Not on file     Non-medical: Not on file   Tobacco Use   ??? Smoking status: Never Smoker   ??? Smokeless tobacco: Never Used   Substance and Sexual Activity   ??? Alcohol use: Not on file   ??? Drug use: Not Currently   ??? Sexual activity: Not Currently   Lifestyle   ??? Physical activity:     Days per week: Not on file     Minutes per session: Not on file   ??? Stress: Not on file   Relationships   ??? Social connections:     Talks on phone: Not on file     Gets together: Not on file     Attends religious service: Not on file     Active member of club or organization: Not on file     Attends meetings of clubs or organizations: Not on file     Relationship status: Not on file   ??? Intimate partner violence:     Fear of current or ex partner: Not on file     Emotionally abused: Not on file     Physically abused: Not on file     Forced sexual activity: Not on file   Other Topics Concern   ??? Not on file   Social History Narrative   ??? Not on file   Lives with wife.  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  Moved from [place] to here in August 2019 to be close with his daughter.    Family History:   History reviewed. No pertinent family history.  Pancreatic cancer in 2 brothers    Allergies:    No Known Allergies      Home Medications:     Medications Prior to Admission  Medication Sig   ??? metoprolol tartrate (LOPRESSOR) 25 mg tablet Take 25 mg by mouth daily.   ??? gabapentin (NEURONTIN) 600 mg tablet Take 600 mg by mouth two (2) times a day.   ??? amLODIPine (NORVASC) 5 mg tablet Take 5 mg by mouth daily.   ??? allopurinol (ZYLOPRIM) 300 mg tablet Take  by mouth daily.   ??? tamsulosin (FLOMAX) 0.4 mg capsule Take 0.4 mg by mouth daily.   ??? lipase-protease-amylase (ZENPEP) 20,000-63,000- 84,000 unit capsule Take 8 Caps by mouth daily.   ??? tiZANidine (ZANAFLEX) 4 mg tablet Take 4 mg by mouth nightly.   ??? fluticasone propionate 100 mcg/actuation dsdv Take  by inhalation.   ??? aspirin 81 mg chewable tablet Take 81 mg by mouth daily.   ??? TESTOSTERONE IM by IntraMUSCular route.       Current Medications:     Current Facility-Administered Medications   Medication Dose Route Frequency   ??? ketorolac (TORADOL) injection 15 mg  15 mg IntraVENous Q6H PRN   ??? dextrose 5 % - 0.45% NaCl infusion  75 mL/hr IntraVENous CONTINUOUS   ??? allopurinol (ZYLOPRIM) tablet 100 mg  100 mg Oral DAILY   ??? amLODIPine (NORVASC) tablet 5 mg  5 mg Oral DAILY   ??? aspirin chewable tablet 81 mg  81 mg Oral DAILY   ??? gabapentin (NEURONTIN) capsule 600 mg  600 mg Oral BID   ??? metoprolol tartrate (LOPRESSOR) tablet 25 mg  25 mg Oral DAILY   ??? tamsulosin (FLOMAX) capsule 0.4 mg  0.4 mg Oral DAILY   ??? tiZANidine (ZANAFLEX) tablet 4 mg  4 mg Oral QHS   ??? sodium chloride (NS) flush 5-10 mL  5-10 mL IntraVENous Q8H   ??? sodium chloride (NS) flush 5-10 mL  5-10 mL IntraVENous PRN   ??? naloxone (NARCAN) injection 0.1 mg  0.1 mg IntraVENous PRN   ??? acetaminophen (TYLENOL) tablet 650 mg  650 mg Oral Q6H PRN   ??? alum-mag hydroxide-simeth (MYLANTA) oral suspension 30 mL  30 mL Oral Q4H PRN   ??? albuterol (PROVENTIL VENTOLIN) nebulizer solution 2.5 mg  2.5 mg Nebulization Q6H PRN    ??? piperacillin-tazobactam (ZOSYN) 3.375 g in 0.9% sodium chloride (MBP/ADV) 100 mL MBP  3.375 g IntraVENous Q8H   ??? enoxaparin (LOVENOX) injection 40 mg  40 mg SubCUTAneous Q24H       Review of Systems:   12 points ROS done. Pertinent positives and negatives are as follows, ROS otherwise negative.    Constitutional: Positive for fever, chills. No unexpected weight change.   HENT: Negative for ear pain, congestion, sore throat, rhinorrhea.   Eyes: Negative for pain, redness and visual disturbance.   Respiratory: negative for shortness of breath, cough, chest tightness, wheezing.   Cardiovascular: Negative for chest pain, palpitations and leg swelling.   Gastrointestinal: Negative for nausea, vomiting, abdominal pain or diarrhea  Genitourinary: Negative for dysuria   Musculoskeletal: Negative for back pain, joint pain or muscle aches   Skin: Negative for ulcers, rash  Neurological: Negative for dizziness, syncope, light-headedness or headaches.   Hematological:Negative for easy bruising or bleeding.   Psychiatric/Behavioral: Negative anxiety, depression.      Physical Exam:  Vitals  Temp (24hrs), Avg:99.1 ??F (37.3 ??C), Min:98.5 ??F (36.9 ??C), Max:99.8 ??F (37.7 ??C)    Visit Vitals  BP 132/62 (BP 1 Location: Left arm, BP Patient Position: Supine)   Pulse 62   Temp 99.8 ??F (37.7 ??C)   Resp 18   Ht  5' 8" (1.727 m)   Wt 67 kg (147 lb 11.3 oz)   SpO2 95%   BMI 22.46 kg/m??       General: Well-developed, 72 y.o. year-old, male, in no acute distress  HEENT: Normocephalic, anicteric sclerae, Pupils equal, round reactive to light, no oropharyngeal lesions. No sinus tenderness.  Neck: Supple, no lymphadenopathy, masses or thyromegaly  Chest: Symmetrical expansion  Lungs: Clear to auscultation bilaterally, no dullness  Heart: Regular rhythm, no murmur, no rub or gallop, No JVD  Abdomen: Soft, non-tender,non distended, no organomegaly, BS+  Musculoskeletal: Normal strength/tone. No edema. No clubbing or cyanosis   CNS: AAOx3. Cranial nerves II-XII intact. Grossly normal.No NR  SKIN: No skin lesion or rash. Dry, warm, intact          Labs: Results:   Chemistry Recent Labs     01/19/18  0321 01/18/18  0509 01/17/18  1634   GLU 121* 119* 155*   NA 138 141 140   K 4.2 4.8 4.1   CL 108* 112* 111*   CO2 23 23 20*   BUN 18 21 26*   CREA 1.5* 1.5* 1.5*   CA 8.5 8.3* 9.0   AGAP _0 AP  --   --  141*   TP  --   --  8.1   ALB  --   --  3.8      CBC w/Diff Recent Labs     01/19/18  0321 01/18/18  0509 01/17/18  1634   WBC 7.0 6.6 7.4   RBC 3.36* 3.41* 3.93   HGB 8.5* 8.7* 10.1*   HCT 28.4* 29.7* 32.5*   PLT 104* 101* 141   GRANS 68.2* 67.2* 83.4*   LYMPH 17.4* 17.1* 6.8*   EOS 2.2 1.2 0.4      Microbiology No results for input(s): CULT in the last 72 hours.       Imaging-    Results from [facility] encounter on 01/17/18   XR CHEST SNGL V    Narrative EXAM:  Chest AP    INDICATIONS: sepsis     COMPARISON: None.    FINDINGS:     No consolidation, pleural effusion, or pulmonary edema. No pneumothorax.    Normal cardiomediastinal contours. Median sternotomy wires and changes  post-CABG.      Impression IMPRESSION:   No acute cardiopulmonary process.         Results from [facility] encounter on 01/17/18   CT CHEST WO CONT    Narrative EXAMINATION: CT CHEST WO CONT    CLINICAL INDICATION: Foreign object assessment    COMPARISON:  None.    TECHNIQUE: All CT exams at this facility use one or more dose reduction  techniques including automatic exposure control, ma/kV  adjustment per patient's  size, or iterative reconstruction technique. DICOM format image data available  to non-affiliated external healthcare facilities or entities on a secure, media  free, reciprocal searchable basis with patient authorization for 12 months  following the date of the study.    Multiple contiguous axial CT images were obtained at 5 mm increments from the  apex of the lungs to the lung bases without contrast    FINDINGS:    Lungs/Soft tissue: Moderate bibasilar pleural thickening and subpleural  atelectasis versus scarring. No intraluminal radiopaque foreign body identified  within the tracheobronchial tree. Sternotomy wires.     Vascular: Unremarkable.    Mediastinum:  Lymph nodes normal size.  No masses.  Thyroid : Unremarkable    Axilla: No adenopathy seen in axilla,      Bones: No significant bony lesions.    Miscellaneous: None    Upper abdomen:  Pneumobilia. Surgical clips gallbladder fossa. Punctate  bilateral renal stones, partially visualized      Impression IMPRESSION:     1. No intraluminal radiopaque foreign body identified within the  tracheobronchial tree.   2. Moderate bibasal pleural thickening and atelectasis/scarring  3. Nonobstructive punctate bilateral renal stones  4. Pneumobilia         ---------------------------------------------------------------------------------------------------------------  I have independently examined the patient and reviewed all lab studies and imgaing as well as review of nursing notes and physican notes from the past 24 hours. The plan of care has been discussed with the patient/relative and all questions are answered.     Dragon medical dictation software was used for portions of this report. Unintended errors may occur.     [name], MD  01/19/2018    [facility]  [phone]

## 2018-01-19 NOTE — Progress Notes (Addendum)
A member of [facility]      Progress Note    Patient: Tommy Rutter Sr. Age: 72 y.o. Sex: male    Date of Birth: [birth year] Admit Date: 01/17/2018 PCP: None   MRN: [id number]  CSN: [id number]       Assessment:     1. Recurrent fever - at risk for recurrent cholangitis if patient has developed stricturing of hepaticojejunostomy (status post Whipple). No growth on blood on blood culture x 24 hrs.   2. Status post Whipple resection Lala Lund, 2006)  3. Comorbidity: CAD, s/p CABG, Cardiac murmur, History of kidney stones, History of carotid.  endarterectomy    Plan:     ?? MRI with MRCP today, await findings.       I have seen and examined the patient independently. The case and chart were reviewed, including all pertinent lab and imaging results. I agree with Ms. Bettina Gavia assessment and plan.  MRI/MRCP reviewed, and there are no findings to suggest stricture at hepaticojejunostomy site.  He clinically does not have cholangitis (no abd pain, no sig LFT elevation).  I do not think that his Whipple site is the cause of his FUO.  He has two small hepatic lesions that will need subsequent monitoring on MRI in 3 months.  Recommend further workup for FUO per medicine and ID.  No further GI input at this time.    Will be available as needed.  Please call if questions.    Bea Graff, M.D.   [facility] [place] Office: [phone]     Subjective:     Patient and wife updated on plan.     Patient Active Problem List   Diagnosis Code   ??? Sepsis ([place]) A41.9        Objective:     Visit Vitals  BP 132/62 (BP 1 Location: Left arm, BP Patient Position: Supine)   Pulse 62   Temp 99.8 ??F (37.7 ??C)   Resp 18   Ht 5\' 8"  (1.727 m)   Wt 67 kg (147 lb 11.3 oz)   SpO2 95%   BMI 22.46 kg/m??      Temp (24hrs), Avg:99 ??F (37.2 ??C), Min:98.5 ??F (36.9 ??C), Max:99.8 ??F (37.7 ??C)       Physical Exam:  GENERAL: Alert, cooperative, no distress.  LUNGS:  Clear anteriorly.    HEART:  Regular rate.   ABDOMEN: Soft, non-tender. Bowel sounds present.       Intake/Output Summary (Last 24 hours) at 01/19/2018 1026  Last data filed at 01/19/2018 9518  Gross per 24 hour   Intake 2140 ml   Output 1250 ml   Net 890 ml       Last Bowel Movement: Last Bowel Movement Date: 01/18/18    Current Facility-Administered Medications   Medication Dose Route Frequency Provider Last Rate Last Dose   ??? ketorolac (TORADOL) injection 15 mg  15 mg IntraVENous Q6H PRN Tonny Bollman, MD   15 mg at 01/18/18 1814   ??? dextrose 5 % - 0.45% NaCl infusion  75 mL/hr IntraVENous CONTINUOUS Stephanie Coup, MD 100 mL/hr at 01/18/18 1549 100 mL/hr at 01/18/18 1549   ??? allopurinol (ZYLOPRIM) tablet 100 mg  100 mg Oral DAILY Charlaine Dalton, MD   100 mg at 01/19/18 0840   ??? amLODIPine (NORVASC) tablet 5 mg  5 mg Oral DAILY Charlaine Dalton, MD   5 mg at 01/19/18 0840   ??? aspirin chewable tablet 81  mg  81 mg Oral DAILY Charlaine Dalton, MD   81 mg at 01/19/18 1610   ??? gabapentin (NEURONTIN) capsule 600 mg  600 mg Oral BID Charlaine Dalton, MD   600 mg at 01/19/18 0840   ??? metoprolol tartrate (LOPRESSOR) tablet 25 mg  25 mg Oral DAILY Charlaine Dalton, MD   25 mg at 01/19/18 0839   ??? tamsulosin (FLOMAX) capsule 0.4 mg  0.4 mg Oral DAILY Charlaine Dalton, MD   0.4 mg at 01/19/18 0840   ??? tiZANidine (ZANAFLEX) tablet 4 mg  4 mg Oral QHS Charlaine Dalton, MD   4 mg at 01/18/18 2126   ??? sodium chloride (NS) flush 5-10 mL  5-10 mL IntraVENous Q8H Charlaine Dalton, MD   10 mL at 01/18/18 2127   ??? sodium chloride (NS) flush 5-10 mL  5-10 mL IntraVENous PRN Charlaine Dalton, MD       ??? naloxone (NARCAN) injection 0.1 mg  0.1 mg IntraVENous PRN Charlaine Dalton, MD       ??? acetaminophen (TYLENOL) tablet 650 mg  650 mg Oral Q6H PRN Charlaine Dalton, MD       ??? alum-mag hydroxide-simeth (MYLANTA) oral suspension 30 mL  30 mL Oral Q4H PRN Charlaine Dalton, MD        ??? albuterol (PROVENTIL VENTOLIN) nebulizer solution 2.5 mg  2.5 mg Nebulization Q6H PRN Charlaine Dalton, MD       ??? piperacillin-tazobactam (ZOSYN) 3.375 g in 0.9% sodium chloride (MBP/ADV) 100 mL MBP  3.375 g IntraVENous Q8H Charlaine Dalton, MD 25 mL/hr at 01/19/18 0838 3.375 g at 01/19/18 9604   ??? enoxaparin (LOVENOX) injection 40 mg  40 mg SubCUTAneous Q24H Charlaine Dalton, MD   40 mg at 01/18/18 2126       CBC w/Diff   Lab Results   Component Value Date/Time    WBC 7.0 01/19/2018 03:21 AM    WBC 6.6 01/18/2018 05:09 AM    WBC 7.4 01/17/2018 04:34 PM    RBC 3.36 (L) 01/19/2018 03:21 AM    RBC 3.41 (L) 01/18/2018 05:09 AM    RBC 3.93 01/17/2018 04:34 PM    HGB 8.5 (L) 01/19/2018 03:21 AM    HGB 8.7 (L) 01/18/2018 05:09 AM    HGB 10.1 (L) 01/17/2018 04:34 PM    HCT 28.4 (L) 01/19/2018 03:21 AM    HCT 29.7 (L) 01/18/2018 05:09 AM    HCT 32.5 (L) 01/17/2018 04:34 PM    MCV 84.5 01/19/2018 03:21 AM    MCV 87.1 01/18/2018 05:09 AM    MCV 82.7 01/17/2018 04:34 PM    MCH 25.3 01/19/2018 03:21 AM    MCH 25.5 01/18/2018 05:09 AM    MCH 25.7 01/17/2018 04:34 PM    MCHC 29.9 (L) 01/19/2018 03:21 AM    MCHC 29.3 (L) 01/18/2018 05:09 AM    MCHC 31.1 01/17/2018 04:34 PM    PLT 104 (L) 01/19/2018 03:21 AM    PLT 101 (L) 01/18/2018 05:09 AM    PLT 141 01/17/2018 04:34 PM     Lab Results   Component Value Date/Time    GRANS 68.2 (H) 01/19/2018 03:21 AM    GRANS 67.2 (H) 01/18/2018 05:09 AM    GRANS 83.4 (H) 01/17/2018 04:34 PM    MONOS 11.8 01/19/2018 03:21 AM    MONOS 14.0 (H) 01/18/2018 05:09 AM    MONOS 9.0 01/17/2018 04:34 PM    EOS 2.2 01/19/2018 03:21 AM    EOS 1.2 01/18/2018 05:09 AM  EOS 0.4 01/17/2018 04:34 PM    BASOS 0.1 01/19/2018 03:21 AM    BASOS 0.2 01/18/2018 05:09 AM    BASOS 0.1 01/17/2018 04:34 PM        Hepatic Function   Lab Results   Component Value Date/Time    ALT 42 01/17/2018 04:34 PM    SGOT 39 (H) 01/17/2018 04:34 PM    TBILI 0.7 01/17/2018 04:34 PM    AP 141 (H) 01/17/2018 04:34 PM     ALB 3.8 01/17/2018 04:34 PM    TP 8.1 01/17/2018 04:34 PM        Pancreatic Enzymes   No results found for: AML, LPSE     Basic Metabolic Profile   Lab Results   Component Value Date/Time    NA 138 01/19/2018 03:21 AM    NA 141 01/18/2018 05:09 AM    NA 140 01/17/2018 04:34 PM    K 4.2 01/19/2018 03:21 AM    K 4.8 01/18/2018 05:09 AM    K 4.1 01/17/2018 04:34 PM    CL 108 (H) 01/19/2018 03:21 AM    CL 112 (H) 01/18/2018 05:09 AM    CL 111 (H) 01/17/2018 04:34 PM    CO2 23 01/19/2018 03:21 AM    CO2 23 01/18/2018 05:09 AM    CO2 20 (L) 01/17/2018 04:34 PM    BUN 18 01/19/2018 03:21 AM    BUN 21 01/18/2018 05:09 AM    BUN 26 (H) 01/17/2018 04:34 PM    GLU 121 (H) 01/19/2018 03:21 AM    GLU 119 (H) 01/18/2018 05:09 AM    GLU 155 (H) 01/17/2018 04:34 PM    CREA 1.5 (H) 01/19/2018 03:21 AM    CREA 1.5 (H) 01/18/2018 05:09 AM    CREA 1.5 (H) 01/17/2018 04:34 PM    CA 8.5 01/19/2018 03:21 AM    CA 8.3 (L) 01/18/2018 05:09 AM    CA 9.0 01/17/2018 04:34 PM        Coagulation   No results found for: PTP, INR, APTT, Bobbye Charleston, NP, FNP-BC  January 19, 2018  [facility]  [phone] (cell)  [place] [phone]

## 2018-01-19 NOTE — Progress Notes (Signed)
Problem: Pain  Goal: *Control of Pain  Outcome: Progressing Towards Goal     Problem: Falls - Risk of  Goal: *Absence of Falls  Description  Document Tommy Brown Fall Risk and appropriate interventions in the flowsheet.  Outcome: Progressing Towards Goal  Note: Fall Risk Interventions:            Medication Interventions: Assess postural VS orthostatic hypotension, Bed/chair exit alarm, Patient to call before getting OOB, Teach patient to arise slowly         History of Falls Interventions: Consult care management for discharge planning, Vital signs minimum Q4HRs X 24 hrs (comment for end date)

## 2018-01-19 NOTE — Progress Notes (Addendum)
Medicine Progress Note    Patient: Tommy Rutter Sr.   Age:  72 y.o.  DOA: 01/17/2018     LOS:  LOS: 2 days     Assessment/Plan   Sepsis likely from intra-abdominal source  Acute kidney injury  Metabolic acidosis  Elevated LFT  Hypertension  Coronary artery disease status post CABG  Pancreatic cancer status post Whipple surgery  BPH    -MRI of abdomen reviewed, unrevealing for source of reported bloodstream infections in past  -Continue empiric antibiotics, blood cultures so far no growth to date  -ID consult appreciated    Subjective:   Patient seen and examined. No complaints, when seen but did become febrile later in day no N/V, CP or SOB  Wife at bedside    Objective:     Visit Vitals  BP 156/71 (BP 1 Location: Right arm, BP Patient Position: Supine)   Pulse 79   Temp (!) 100.5 ??F (38.1 ??C)   Resp 16   Ht 5\' 8"  (1.727 m)   Wt 67 kg (147 lb 11.3 oz)   SpO2 94%   BMI 22.46 kg/m??       Physical Exam:  General appearance: alert, cooperative, no distress, appears stated age  Head: Normocephalic, without obvious abnormality, atraumatic  Neck: supple, trachea midline  Lungs: clear to auscultation bilaterally  Heart: regular rate and rhythm, S1, S2 normal, no murmur, click, rub or gallop  Abdomen: soft, non-tender. Bowel sounds normal. No masses,  no organomegaly  Extremities: extremities normal, atraumatic, no cyanosis or edema  Skin: Skin color, texture, turgor normal. No rashes or lesions in exposed areas       Medications Reviewed:  Current Facility-Administered Medications   Medication Dose Route Frequency   ??? ketorolac (TORADOL) injection 15 mg  15 mg IntraVENous Q6H PRN   ??? dextrose 5 % - 0.45% NaCl infusion  75 mL/hr IntraVENous CONTINUOUS   ??? allopurinol (ZYLOPRIM) tablet 100 mg  100 mg Oral DAILY   ??? amLODIPine (NORVASC) tablet 5 mg  5 mg Oral DAILY   ??? aspirin chewable tablet 81 mg  81 mg Oral DAILY   ??? gabapentin (NEURONTIN) capsule 600 mg  600 mg Oral BID    ??? metoprolol tartrate (LOPRESSOR) tablet 25 mg  25 mg Oral DAILY   ??? tamsulosin (FLOMAX) capsule 0.4 mg  0.4 mg Oral DAILY   ??? tiZANidine (ZANAFLEX) tablet 4 mg  4 mg Oral QHS   ??? sodium chloride (NS) flush 5-10 mL  5-10 mL IntraVENous Q8H   ??? sodium chloride (NS) flush 5-10 mL  5-10 mL IntraVENous PRN   ??? naloxone (NARCAN) injection 0.1 mg  0.1 mg IntraVENous PRN   ??? acetaminophen (TYLENOL) tablet 650 mg  650 mg Oral Q6H PRN   ??? alum-mag hydroxide-simeth (MYLANTA) oral suspension 30 mL  30 mL Oral Q4H PRN   ??? albuterol (PROVENTIL VENTOLIN) nebulizer solution 2.5 mg  2.5 mg Nebulization Q6H PRN   ??? piperacillin-tazobactam (ZOSYN) 3.375 g in 0.9% sodium chloride (MBP/ADV) 100 mL MBP  3.375 g IntraVENous Q8H   ??? enoxaparin (LOVENOX) injection 40 mg  40 mg SubCUTAneous Q24H         Intake and Output:  Current Shift:  11/25 0701 - 11/25 1900  In: 120 [P.O.:120]  Out: 200 [Urine:200]  Last three shifts:  11/23 1901 - 11/25 0700  In: 2140 [P.O.:840; I.V.:1300]  Out: 1050 [Urine:1050]    Lab/Data Reviewed:  All lab and imaging  results for  the last 24 hours reviewed.    Stephanie Coup, MD  P# [phone]  January 19, 2018    [facility] medical dictation software was used for portions of this report.  Unintended voice transcription errors may have occurred.

## 2018-01-19 NOTE — Other (Signed)
Patient request to be sedated before MRI test. Call Dr Nolen Mu to discussed patient's concern. Received order for Ativan 1 mg IV.

## 2018-01-19 NOTE — Other (Signed)
Hi Dr Nolen Mu. patient in room 2125 Tommy Brown complained of chest pain. EKG was done on the patient. NSR with incomplete right bundle branch block rate 78. any recommendation? call back [phone]. The above page was sent to Dr Nolen Mu at [phone] concerning patient heart rate, rhythm and chest pain complain.

## 2018-01-19 NOTE — Progress Notes (Signed)
D/C Plan Home - [facility] referral sent via EPIC patient is aware of D/C plan and verbalizes understanding     Anticipated date of D/C - 2-3 days     When patient was asked: Would you like for me to discuss the discharge plan with any other family members/significant others Patient replied: patient gave verbal consent to speak with wife (Wife at bedside    Transportation - Wife     Care Management Interventions  Mode of Transport at Discharge: (wife )  Current Support Network: Lives with Spouse  Confirm Follow Up Transport: Family  Confirm Transport and Arrange: Yes  Plan discussed with Pt/Family/Caregiver: Yes  Discharge Location  Discharge Placement: Home with family assistance

## 2018-01-19 NOTE — Progress Notes (Signed)
Medicine Progress Note    Patient: Tommy Rutter Sr.   Age:  72 y.o.  DOA: 01/17/2018     LOS:  LOS: 2 days     Assessment/Plan   Sepsis likely from intra-abdominal source  Acute kidney injury  Metabolic acidosis  Elevated LFT  Hypertension  Coronary artery disease status post CABG  Pancreatic cancer status post Whipple surgery  BPH    -MRI of abdomen reviewed, unrevealing for source of reported bloodstream infections in past  -Continue empiric antibiotics, blood cultures so far no growth to date  -ID consult appreciated    Subjective:   Patient seen and examined. No complaints, when seen but did become febrile later in day no N/V, CP or SOB  Wife at bedside    Objective:     Visit Vitals  BP 156/71 (BP 1 Location: Right arm, BP Patient Position: Supine)   Pulse 79   Temp (!) 100.5 ??F (38.1 ??C)   Resp 16   Ht 5\' 8"  (1.727 m)   Wt 67 kg (147 lb 11.3 oz)   SpO2 94%   BMI 22.46 kg/m??       Physical Exam:  General appearance: alert, cooperative, no distress, appears stated age  Head: Normocephalic, without obvious abnormality, atraumatic  Neck: supple, trachea midline  Lungs: clear to auscultation bilaterally  Heart: regular rate and rhythm, S1, S2 normal, no murmur, click, rub or gallop  Abdomen: soft, non-tender. Bowel sounds normal. No masses,  no organomegaly  Extremities: extremities normal, atraumatic, no cyanosis or edema  Skin: Skin color, texture, turgor normal. No rashes or lesions in exposed areas       Medications Reviewed:  Current Facility-Administered Medications   Medication Dose Route Frequency   ??? ketorolac (TORADOL) injection 15 mg  15 mg IntraVENous Q6H PRN   ??? dextrose 5 % - 0.45% NaCl infusion  75 mL/hr IntraVENous CONTINUOUS   ??? allopurinol (ZYLOPRIM) tablet 100 mg  100 mg Oral DAILY   ??? amLODIPine (NORVASC) tablet 5 mg  5 mg Oral DAILY   ??? aspirin chewable tablet 81 mg  81 mg Oral DAILY   ??? gabapentin (NEURONTIN) capsule 600 mg  600 mg Oral BID   ??? metoprolol tartrate (LOPRESSOR) tablet 25 mg   25 mg Oral DAILY   ??? tamsulosin (FLOMAX) capsule 0.4 mg  0.4 mg Oral DAILY   ??? tiZANidine (ZANAFLEX) tablet 4 mg  4 mg Oral QHS   ??? sodium chloride (NS) flush 5-10 mL  5-10 mL IntraVENous Q8H   ??? sodium chloride (NS) flush 5-10 mL  5-10 mL IntraVENous PRN   ??? naloxone (NARCAN) injection 0.1 mg  0.1 mg IntraVENous PRN   ??? acetaminophen (TYLENOL) tablet 650 mg  650 mg Oral Q6H PRN   ??? alum-mag hydroxide-simeth (MYLANTA) oral suspension 30 mL  30 mL Oral Q4H PRN   ??? albuterol (PROVENTIL VENTOLIN) nebulizer solution 2.5 mg  2.5 mg Nebulization Q6H PRN   ??? piperacillin-tazobactam (ZOSYN) 3.375 g in 0.9% sodium chloride (MBP/ADV) 100 mL MBP  3.375 g IntraVENous Q8H   ??? enoxaparin (LOVENOX) injection 40 mg  40 mg SubCUTAneous Q24H         Intake and Output:  Current Shift:  11/25 0701 - 11/25 1900  In: 120 [P.O.:120]  Out: 200 [Urine:200]  Last three shifts:  11/23 1901 - 11/25 0700  In: 2140 [P.O.:840; I.V.:1300]  Out: 1050 [Urine:1050]    Lab/Data Reviewed:  All lab and imaging  results for  the last 24 hours reviewed.    Stephanie Coup, MD  P# [phone]  January 19, 2018    [facility] medical dictation software was used for portions of this report.  Unintended voice transcription errors may have occurred.

## 2018-01-19 NOTE — Consults (Signed)
Consults by [name], MD at 01/19/18 1417                Author: [name], MD  Service: Infectious Disease  Author Type: Physician       Filed: 01/19/18 1520  Date of Service: 01/19/18 1417  Status: Addendum          Editor: [name], MD (Physician)          Related Notes: Original Note by [name], MD (Physician) filed at 01/19/18 1518                                                                                       INFECTIOUS DISEASE CONSULT NOTE          Requested by: Dr. Nolen Mu      Reason for consult: Fever, recurrent infections      Date of admission: 01/17/2018      Date of consult: January 19, 2018           ABX:           Current abx  Prior abx         Zosyn 11/23-2            ASSESSMENT:           Recurrent fever with chills   - no definite source clinically   - ?sec to sec to Whipple's dis     AKI versus CKD     Anemia     Transaminitis-mild     2 Small indeterminate foci of ringlike hepatic enhancement-seen on MRI   - Nonspecific endings   -??  Microabscesses or metastatic lesions vs normal heterogenous process     History of recurrent fever and chills every 2 months   -Started since Whipple surgery and had grown Klebsiella few times   -Per patient secondary to infected pouch   -Previous work-up done with negative results     Pancreatic cancer status post Whipple resection in 2012 and incomplete adjuvant chemotherapy       Hyperglycemia       History of kidney stones       History of carotid endarterectomy     CAD status post CABG     History of C. difficile                    RECOMMENDATIONS:        This is very interesting but complicated patient with history of recurrent fever and chills every couple of months since his Whipple surgery 7 years ago      -Patient tells me with history of recurrent bacteremia and also on prophylactic antibiotic for long time without any significant improvement in recurrence of these episodes   -He denies any weight loss, recurrent rash,  arthritis.  No risk factors with international travel, pets, IV drug use.  He denies any bug bite mosquito bite.  No occupational risk, currently retired   -Currently afebrile with normal white count    -Currently on Zosyn-should be broad enough pending culture results   -Cause of recurrent fevers seems to be unclear.  Patient  has moved from [place] and will need records to review   -MRI reviewed and with nonspecific findings-significance unclear at this time.  Await further input from surgery and GI   -2D echo without any definite valvular lesion.  I will order Coxiella and Brucella antibodies.  We will also order Fungitell   -Need to rule out for connective tissue disorder - will order ESR CRP and complement levels along with ANA   -Watch for any development of diarrhea given history of C. difficile as on broad-spectrum antibiotics and also chronic suppressive antibiotics with?  Keflex   -Avoid nephrotoxic medication and dose for current creatinine clearance   -We will monitor results and decide further plan based on them and clinical response                     MICROBIOLOGY:        11/23 blood culture x2 NTD    Urine culture negative        LINES AND CATHETERS:        piv        HPI:        Tommy Brown is a 72 year old male with past medical history of pancreatic cancer status post Whipple's disease, CABG, kidney stones was admitted on 11/23 with fever and chills.      Patient was diagnosed and treated for pancreatic cancer at [place], [place] in 2012.  He underwent Whipple surgery and received adjuvant chemoradiation after the surgery.  He was not able to complete the adjuvant course given toxicity.  Patient  since his surgery has had recurrent episodes of sepsis which was reportedly secondary to an infected pouch.  Patient claims that he has an infection at least once a year.  He also tells me that has been seen by immunologist and was told that high likelihood  of having recurrent  infections given old pocket and at risk for recurrent infections.  He recently moved to [place].  He developed sudden onset of abdominal pain along with fever and chills and thought similar to his previous episodes.  He came to  ED.  Cultures were drawn.  He was started on broad-spectrum antibiotic with Vanco and Zosyn.  Patient was seen by GI and also oncology.  We were asked for further evaluation management.        Past medical history:          Past Medical History:        Diagnosis  Date         ?  Hx of CABG       ?  Kidney stones           ?  Pancreatic cancer [facility])               Past Surgical History:         Procedure  Laterality  Date          ?  HX ORTHOPAEDIC              back              Social History:          Social History          Socioeconomic History         ?  Marital status:  SINGLE              Spouse name:  Not on file         ?  Number of children:  Not on file     ?  Years of education:  Not on file     ?  Highest education level:  Not on file       Occupational History        ?  Not on file       Social Needs         ?  Financial resource strain:  Not on file        ?  Food insecurity:              Worry:  Not on file         Inability:  Not on file        ?  Transportation needs:              Medical:  Not on file         Non-medical:  Not on file       Tobacco Use         ?  Smoking status:  Never Smoker     ?  Smokeless tobacco:  Never Used       Substance and Sexual Activity         ?  Alcohol use:  Not on file     ?  Drug use:  Not Currently     ?  Sexual activity:  Not Currently       Lifestyle        ?  Physical activity:              Days per week:  Not on file         Minutes per session:  Not on file         ?  Stress:  Not on file       Relationships        ?  Social connections:              Talks on phone:  Not on file         Gets together:  Not on file         Attends religious service:  Not on file         Active member of club or organization:  Not on file          Attends meetings of clubs or organizations:  Not on file         Relationship status:  Not on file        ?  Intimate partner violence:              Fear of current or ex partner:  Not on file         Emotionally abused:  Not on file         Physically abused:  Not on file         Forced sexual activity:  Not on file        Other Topics  Concern        ?  Not on file       Social History Narrative        ?  Not on file     Lives with wife.  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  Moved from [place] to here in  August 2019 to be close with his daughter.  Family History:     History reviewed. No pertinent family history.   Pancreatic cancer in 2 brothers        Allergies:     No Known Allergies           Home Medications:          Medications Prior to Admission        Medication  Sig         ?  metoprolol tartrate (LOPRESSOR) 25 mg tablet  Take 25 mg by mouth daily.     ?  gabapentin (NEURONTIN) 600 mg tablet  Take 600 mg by mouth two (2) times a day.     ?  amLODIPine (NORVASC) 5 mg tablet  Take 5 mg by mouth daily.     ?  allopurinol (ZYLOPRIM) 300 mg tablet  Take  by mouth daily.     ?  tamsulosin (FLOMAX) 0.4 mg capsule  Take 0.4 mg by mouth daily.     ?  lipase-protease-amylase (ZENPEP) 20,000-63,000- 84,000 unit capsule  Take 8 Caps by mouth daily.     ?  tiZANidine (ZANAFLEX) 4 mg tablet  Take 4 mg by mouth nightly.     ?  fluticasone propionate 100 mcg/actuation dsdv  Take  by inhalation.     ?  aspirin 81 mg chewable tablet  Take 81 mg by mouth daily.         ?  TESTOSTERONE IM  by IntraMUSCular route.             Current Medications:          Current Facility-Administered Medications          Medication  Dose  Route  Frequency           ?  ketorolac (TORADOL) injection 15 mg   15 mg  IntraVENous  Q6H PRN     ?  dextrose 5 % - 0.45% NaCl infusion   75 mL/hr  IntraVENous  CONTINUOUS     ?  allopurinol (ZYLOPRIM)  tablet 100 mg   100 mg  Oral  DAILY     ?  amLODIPine (NORVASC) tablet 5 mg   5 mg  Oral  DAILY     ?  aspirin chewable tablet 81 mg   81 mg  Oral  DAILY     ?  gabapentin (NEURONTIN) capsule 600 mg   600 mg  Oral  BID     ?  metoprolol tartrate (LOPRESSOR) tablet 25 mg   25 mg  Oral  DAILY     ?  tamsulosin (FLOMAX) capsule 0.4 mg   0.4 mg  Oral  DAILY     ?  tiZANidine (ZANAFLEX) tablet 4 mg   4 mg  Oral  QHS     ?  sodium chloride (NS) flush 5-10 mL   5-10 mL  IntraVENous  Q8H     ?  sodium chloride (NS) flush 5-10 mL   5-10 mL  IntraVENous  PRN     ?  naloxone (NARCAN) injection 0.1 mg   0.1 mg  IntraVENous  PRN     ?  acetaminophen (TYLENOL) tablet 650 mg   650 mg  Oral  Q6H PRN     ?  alum-mag hydroxide-simeth (MYLANTA) oral suspension 30 mL   30 mL  Oral  Q4H PRN     ?  albuterol (PROVENTIL VENTOLIN) nebulizer solution 2.5 mg   2.5 mg  Nebulization  Q6H  PRN     ?  piperacillin-tazobactam (ZOSYN) 3.375 g in 0.9% sodium chloride (MBP/ADV) 100 mL MBP   3.375 g  IntraVENous  Q8H           ?  enoxaparin (LOVENOX) injection 40 mg   40 mg  SubCUTAneous  Q24H             Review of Systems:     12 points ROS done. Pertinent positives and negatives are as follows, ROS otherwise negative.      Constitutional: Positive for fever, chills. No unexpected weight change.    HENT: Negative for ear pain, congestion, sore throat, rhinorrhea.    Eyes: Negative for pain, redness and visual disturbance.    Respiratory: negative for shortness of breath, cough, chest tightness, wheezing.    Cardiovascular: Negative for chest pain, palpitations and leg swelling.    Gastrointestinal: Negative for nausea, vomiting, abdominal pain or diarrhea   Genitourinary: Negative for dysuria    Musculoskeletal: Negative for back pain, joint pain or muscle aches    Skin: Negative for ulcers, rash   Neurological: Negative for dizziness, syncope, light-headedness or headaches.    Hematological:Negative for easy bruising or bleeding.     Psychiatric/Behavioral: Negative anxiety, depression.         Physical Exam:   Vitals   Temp (24hrs), Avg:99.1 ??F (37.3 ??C), Min:98.5 ??F (36.9 ??C), Max:99.8 ??F (37.7 ??C)      Visit Vitals      BP  132/62 (BP 1 Location: Left arm, BP Patient Position: Supine)     Pulse  62     Temp  99.8 ??F (37.7 ??C)     Resp  18     Ht  5' 8"  (1.727 m)     Wt  67 kg (147 lb 11.3 oz)     SpO2  95%        BMI  22.46 kg/m??           General: Well-developed, 72 y.o. year-old, male , in no acute distress   HEENT: Normocephalic, anicteric sclerae, Pupils equal, round reactive to light, no oropharyngeal lesions. No sinus tenderness.   Neck: Supple, no lymphadenopathy, masses or thyromegaly   Chest: Symmetrical expansion   Lungs: Clear to auscultation bilaterally, no dullness   Heart: Regular rhythm, no murmur, no rub or gallop, No JVD   Abdomen: Soft, non-tender,non distended, no organomegaly, BS+   Musculoskeletal: Normal strength/tone. No edema. No clubbing or cyanosis   CNS: AAOx3. Cranial nerves II-XII intact. Grossly normal.No NR   SKIN: No skin lesion or rash. Dry, warm, intact                 Labs:  Results:        Chemistry  Recent Labs         01/19/18   0321  01/18/18   0509  01/17/18   1634      GLU  121*  119*  155*      NA  138  141  140      K  4.2  4.8  4.1      CL  108*  112*  111*      CO2  23  23  20*      BUN  18  21  26*      CREA  1.5*  1.5*  1.5*      CA  8.5  8.3*  9.0  AGAP  6  6  9       AP   --    --   141*      TP   --    --   8.1      ALB   --    --   3.8                 CBC w/Diff  Recent Labs         01/19/18   0321  01/18/18   0509  01/17/18   1634      WBC  7.0  6.6  7.4      RBC  3.36*  3.41*  3.93      HGB  8.5*  8.7*  10.1*      HCT  28.4*  29.7*  32.5*      PLT  104*  101*  141      GRANS  68.2*  67.2*  83.4*      LYMPH  17.4*  17.1*  6.8*      EOS  2.2  1.2  0.4                 Microbiology  No results for input(s): CULT in the last 72 hours.           Imaging-        Results from [facility]  encounter on 01/17/18     XR CHEST SNGL V           Narrative  EXAM:  Chest AP      INDICATIONS: sepsis       COMPARISON: None.      FINDINGS:       No consolidation, pleural effusion, or pulmonary edema. No pneumothorax.      Normal cardiomediastinal contours. Median sternotomy wires and changes   post-CABG.              Impression  IMPRESSION:    No acute cardiopulmonary process.                Results from [facility] encounter on 01/17/18     CT CHEST WO CONT           Narrative  EXAMINATION: CT CHEST WO CONT      CLINICAL INDICATION: Foreign object assessment      COMPARISON:  None.      TECHNIQUE: All CT exams at this facility use one or more dose reduction   techniques including automatic exposure control, ma/kV  adjustment per patient's   size, or iterative reconstruction technique. DICOM format image data available   to non-affiliated external healthcare facilities or entities on a secure, media   free, reciprocal searchable basis with patient authorization for 12 months   following the date of the study.      Multiple contiguous axial CT images were obtained at 5 mm increments from the   apex of the lungs to the lung bases without contrast      FINDINGS:    Lungs/Soft tissue: Moderate bibasilar pleural thickening and subpleural   atelectasis versus scarring. No intraluminal radiopaque foreign body identified   within the tracheobronchial tree. Sternotomy wires.       Vascular: Unremarkable.      Mediastinum:  Lymph nodes normal size.  No masses.      Thyroid : Unremarkable      Axilla: No adenopathy seen in axilla,  Bones: No significant bony lesions.      Miscellaneous: None      Upper abdomen:  Pneumobilia. Surgical clips gallbladder fossa. Punctate   bilateral renal stones, partially visualized              Impression  IMPRESSION:       1. No intraluminal radiopaque foreign body identified within the   tracheobronchial tree.    2. Moderate bibasal pleural thickening and  atelectasis/scarring   3. Nonobstructive punctate bilateral renal stones   4. Pneumobilia              ---------------------------------------------------------------------------------------------------------------   I have independently examined the patient and reviewed all lab studies and imgaing as well as review of nursing notes and physican notes from the past 24 hours. The plan of care has been discussed with  the patient/relative and all questions are answered.       [employer] medical dictation software was used for portions of this report. Unintended errors may occur.       [name], MD   01/19/2018      [facility]   [phone]

## 2018-01-19 NOTE — Progress Notes (Signed)
Progress Notes by Polo Riley, MD at 01/19/18 1026                Author: Polo Riley, MD  Service: Gastroenterology  Author Type: Physician       Filed: 01/19/18 1917  Date of Service: 01/19/18 1026  Status: Addendum          Editor: Polo Riley, MD (Physician)          Related Notes: Original Note by Lyanne Co, NP (Nurse Practitioner) filed at 01/19/18 1030                       A member of [facility], [facility]         Progress Note          Patient: Tommy Rutter Sr.  Age: 72 y.o.  Sex: male          Date of Birth: Sep 29, 1945  Admit Date: 01/17/2018  PCP: None     MRN: [id number]   CSN: [id number]             Assessment:        1.  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 No growth on blood on blood culture x 24 hrs.    2.  Status post Whipple resection Lala Lund, 2006)   3.  Comorbidity: CAD, s/p CABG, Cardiac murmur, History of kidney stones, History of carotid.  endarterectomy        Plan:        ??  MRI with MRCP today, await findings.          I have seen and examined the patient independently. The case and chart were reviewed, including all pertinent lab and imaging results. I agree with Ms. Bettina Gavia assessment and plan.  MRI/MRCP  reviewed, and there are no findings to suggest stricture at hepaticojejunostomy site.  He clinically does not have cholangitis (no abd pain, no sig LFT elevation).  I do not think that his Whipple site is the cause of his FUO.  He has two small hepatic  lesions that will need subsequent monitoring on MRI in 3 months.  Recommend further workup for FUO per medicine and ID.  No further GI input at this time.      Will be available as needed.  Please call if questions.      Bea Graff, M.D.    [facility] [place] Office: [phone]         Subjective:        Patient and wife updated on plan.         Patient  Active Problem List        Diagnosis  Code         ?  Sepsis ([place])  A41.9              Objective:        Visit Vitals      BP  132/62 (BP 1 Location: Left arm, BP Patient Position: Supine)     Pulse  62     Temp  99.8 ??F (37.7 ??C)     Resp  18     Ht  5\' 8"  (1.727 m)     Wt  67 kg (147 lb 11.3 oz)     SpO2  95%  BMI  22.46 kg/m??         Temp (24hrs), Avg:99 ??F (37.2 ??C), Min:98.5 ??F (36.9 ??C), Max:99.8 ??F (37.7 ??C)          Physical Exam:   GENERAL: Alert, cooperative, no distress.   LUNGS:  Clear anteriorly.    HEART:  Regular rate.    ABDOMEN: Soft, non-tender. Bowel sounds present.          Intake/Output Summary (Last 24 hours) at 01/19/2018 1026   Last data filed at 01/19/2018 5638     Gross per 24 hour        Intake  2140 ml        Output  1250 ml        Net  890 ml           Last Bowel Movement: Last Bowel Movement Date: 01/18/18        Current Facility-Administered Medications             Medication  Dose  Route  Frequency  Provider  Last Rate  Last Dose              ?  ketorolac (TORADOL) injection 15 mg   15 mg  IntraVENous  Q6H PRN  Tonny Bollman, MD     15 mg at 01/18/18 1814     ?  dextrose 5 % - 0.45% NaCl infusion   75 mL/hr  IntraVENous  CONTINUOUS  Stephanie Coup, MD  100 mL/hr at 01/18/18 1549  100 mL/hr at 01/18/18 1549     ?  allopurinol (ZYLOPRIM) tablet 100 mg   100 mg  Oral  DAILY  Charlaine Dalton, MD     100 mg at 01/19/18 0840     ?  amLODIPine (NORVASC) tablet 5 mg   5 mg  Oral  DAILY  Charlaine Dalton, MD     5 mg at 01/19/18 0840     ?  aspirin chewable tablet 81 mg   81 mg  Oral  DAILY  Charlaine Dalton, MD     81 mg at 01/19/18 7564              ?  gabapentin (NEURONTIN) capsule 600 mg   600 mg  Oral  BID  Charlaine Dalton, MD     600 mg at 01/19/18 0840              ?  metoprolol tartrate (LOPRESSOR) tablet 25 mg   25 mg  Oral  DAILY  Charlaine Dalton, MD     25 mg at 01/19/18 3329     ?  tamsulosin (FLOMAX) capsule 0.4 mg   0.4 mg  Oral  DAILY  Charlaine Dalton, MD     0.4 mg at 01/19/18  0840     ?  tiZANidine (ZANAFLEX) tablet 4 mg   4 mg  Oral  QHS  Charlaine Dalton, MD     4 mg at 01/18/18 2126     ?  sodium chloride (NS) flush 5-10 mL   5-10 mL  IntraVENous  Q8H  Charlaine Dalton, MD     10 mL at 01/18/18 2127     ?  sodium chloride (NS) flush 5-10 mL   5-10 mL  IntraVENous  PRN  Charlaine Dalton, MD           ?  naloxone Karma Greaser) injection 0.1 mg   0.1 mg  IntraVENous  PRN  Charlaine Dalton, MD           ?  acetaminophen (TYLENOL) tablet 650 mg   650 mg  Oral  Q6H PRN  Charlaine Dalton, MD           ?  alum-mag hydroxide-simeth (MYLANTA) oral suspension 30 mL   30 mL  Oral  Q4H PRN  Charlaine Dalton, MD           ?  albuterol (PROVENTIL VENTOLIN) nebulizer solution 2.5 mg   2.5 mg  Nebulization  Q6H PRN  Charlaine Dalton, MD           ?  piperacillin-tazobactam (ZOSYN) 3.375 g in 0.9% sodium chloride (MBP/ADV) 100 mL MBP   3.375 g  IntraVENous  Q8H  Charlaine Dalton, MD  25 mL/hr at 01/19/18 0838  3.375 g at 01/19/18 7902              ?  enoxaparin (LOVENOX) injection 40 mg   40 mg  SubCUTAneous  Q24H  Charlaine Dalton, MD     40 mg at 01/18/18 2126             CBC w/Diff     Lab Results      Component  Value  Date/Time        WBC  7.0  01/19/2018 03:21 AM        WBC  6.6  01/18/2018 05:09 AM        WBC  7.4  01/17/2018 04:34 PM        RBC  3.36 (L)  01/19/2018 03:21 AM        RBC  3.41 (L)  01/18/2018 05:09 AM        RBC  3.93  01/17/2018 04:34 PM        HGB  8.5 (L)  01/19/2018 03:21 AM        HGB  8.7 (L)  01/18/2018 05:09 AM        HGB  10.1 (L)  01/17/2018 04:34 PM        HCT  28.4 (L)  01/19/2018 03:21 AM        HCT  29.7 (L)  01/18/2018 05:09 AM        HCT  32.5 (L)  01/17/2018 04:34 PM        MCV  84.5  01/19/2018 03:21 AM        MCV  87.1  01/18/2018 05:09 AM        MCV  82.7  01/17/2018 04:34 PM        MCH  25.3  01/19/2018 03:21 AM        MCH  25.5  01/18/2018 05:09 AM        MCH  25.7  01/17/2018 04:34 PM        MCHC  29.9 (L)  01/19/2018 03:21 AM        MCHC  29.3 (L)  01/18/2018 05:09 AM         MCHC  31.1  01/17/2018 04:34 PM        PLT  104 (L)  01/19/2018 03:21 AM        PLT  101 (L)  01/18/2018 05:09 AM        PLT  141  01/17/2018 04:34 PM           Lab Results      Component  Value  Date/Time        GRANS  68.2 (H)  01/19/2018 03:21 AM        GRANS  67.2 (H)  01/18/2018  05:09 AM        GRANS  83.4 (H)  01/17/2018 04:34 PM        MONOS  11.8  01/19/2018 03:21 AM        MONOS  14.0 (H)  01/18/2018 05:09 AM        MONOS  9.0  01/17/2018 04:34 PM        EOS  2.2  01/19/2018 03:21 AM        EOS  1.2  01/18/2018 05:09 AM        EOS  0.4  01/17/2018 04:34 PM        BASOS  0.1  01/19/2018 03:21 AM        BASOS  0.2  01/18/2018 05:09 AM        BASOS  0.1  01/17/2018 04:34 PM                  Hepatic Function     Lab Results      Component  Value  Date/Time        ALT  42  01/17/2018 04:34 PM        SGOT  39 (H)  01/17/2018 04:34 PM        TBILI  0.7  01/17/2018 04:34 PM        AP  141 (H)  01/17/2018 04:34 PM        ALB  3.8  01/17/2018 04:34 PM        TP  8.1  01/17/2018 04:34 PM                  Pancreatic Enzymes       No results found for: AML, LPSE          Basic Metabolic Profile     Lab Results      Component  Value  Date/Time        NA  138  01/19/2018 03:21 AM        NA  141  01/18/2018 05:09 AM        NA  140  01/17/2018 04:34 PM        K  4.2  01/19/2018 03:21 AM        K  4.8  01/18/2018 05:09 AM        K  4.1  01/17/2018 04:34 PM        CL  108 (H)  01/19/2018 03:21 AM        CL  112 (H)  01/18/2018 05:09 AM        CL  111 (H)  01/17/2018 04:34 PM        CO2  23  01/19/2018 03:21 AM        CO2  23  01/18/2018 05:09 AM        CO2  20 (L)  01/17/2018 04:34 PM        BUN  18  01/19/2018 03:21 AM        BUN  21  01/18/2018 05:09 AM        BUN  26 (H)  01/17/2018 04:34 PM        GLU  121 (H)  01/19/2018 03:21 AM        GLU  119 (H)  01/18/2018 05:09 AM        GLU  155 (H)  01/17/2018 04:34 PM        CREA  1.5 (H)  01/19/2018 03:21 AM  CREA  1.5 (H)  01/18/2018 05:09 AM        CREA  1.5 (H)   01/17/2018 04:34 PM        CA  8.5  01/19/2018 03:21 AM        CA  8.3 (L)  01/18/2018 05:09 AM        CA  9.0  01/17/2018 04:34 PM                  Coagulation       No results found for: PTP, INR, APTT, Bobbye Charleston, NP, FNP-BC   January 19, 2018   [facility]   [phone] (cell)   [facility] [phone]

## 2018-01-20 LAB — EKG, 12 LEAD, INITIAL
Atrial Rate: 77 {beats}/min
Calculated P Axis: 17 degrees
Calculated R Axis: -34 degrees
Calculated T Axis: 41 degrees
Diagnosis: NORMAL
P-R Interval: 178 ms
Q-T Interval: 388 ms
QRS Duration: 92 ms
QTC Calculation (Bezet): 439 ms
Ventricular Rate: 77 {beats}/min

## 2018-01-20 LAB — CANCER AG 19-9
CA 19-9: 12 U/mL (ref <34)
Carbohydrate Antigen 19-9, (CA 19-9): 12 U/mL (ref <34)

## 2018-01-20 LAB — EKG 12-LEAD
Atrial Rate: 77 {beats}/min
Diagnosis: NORMAL
P Axis: 17 degrees
P-R Interval: 178 ms
Q-T Interval: 388 ms
QRS Duration: 92 ms
QTc Calculation (Bazett): 439 ms
R Axis: -34 degrees
T Axis: 41 degrees
Ventricular Rate: 77 {beats}/min

## 2018-01-20 MED FILL — PIPERACILLIN-TAZOBACTAM 3.375 GRAM IV SOLR: 3.375 gram | INTRAVENOUS | Qty: 3.38

## 2018-01-20 MED FILL — GABAPENTIN 300 MG CAP: 300 mg | ORAL | Qty: 2

## 2018-01-20 MED FILL — KETOROLAC TROMETHAMINE 30 MG/ML INJECTION: 30 mg/mL (1 mL) | INTRAMUSCULAR | Qty: 1

## 2018-01-20 MED FILL — BD POSIFLUSH NORMAL SALINE 0.9 % INJECTION SYRINGE: INTRAMUSCULAR | Qty: 10

## 2018-01-20 MED FILL — LOVENOX 40 MG/0.4 ML SUBCUTANEOUS SYRINGE: 40 mg/0.4 mL | SUBCUTANEOUS | Qty: 0.4

## 2018-01-20 MED FILL — ASPIRIN 81 MG CHEWABLE TAB: 81 mg | ORAL | Qty: 1

## 2018-01-20 MED FILL — TAMSULOSIN SR 0.4 MG 24 HR CAP: 0.4 mg | ORAL | Qty: 1

## 2018-01-20 MED FILL — TIZANIDINE 4 MG TAB: 4 mg | ORAL | Qty: 1

## 2018-01-20 MED FILL — AMLODIPINE 5 MG TAB: 5 mg | ORAL | Qty: 1

## 2018-01-20 MED FILL — ALLOPURINOL 100 MG TAB: 100 mg | ORAL | Qty: 1

## 2018-01-20 MED FILL — METOPROLOL TARTRATE 25 MG TAB: 25 mg | ORAL | Qty: 1

## 2018-01-20 NOTE — Progress Notes (Signed)
INFECTIOUS DISEASE FOLLOW UP NOTE :-     Admit Date: 01/17/2018    Date of admission: 01/17/2018  ??  Date of consult: January 19, 2018  ??  ??  ABX:   ??  Current abx Prior abx    Zosyn 11/23-3 ??   ??  ASSESSMENT:    ??  Recurrent fever with chills  - no definite source clinically  - ?sec to sec to Whipple's dis   AKI versus CKD   Anemia   Transaminitis-mild   2 Small indeterminate foci of ringlike hepatic enhancement-seen on MRI  - Nonspecific endings  -??  Microabscesses or metastatic lesions vs normal heterogenous process   History of recurrent fever and chills every 2 months  -Started since Whipple surgery and had grown Klebsiella few times  -Per patient secondary to infected pouch  -Previous work-up done with negative results   Pancreatic cancer status post Whipple resection in 2012 and incomplete adjuvant chemotherapy   Hyperglycemia   History of kidney stones   History of carotid endarterectomy   CAD status post CABG   History of C. difficile   ??   ??  ??  RECOMMENDATIONS:   ??  This is very interesting but complicated patient with history of recurrent fever and chills every couple of months since his Whipple surgery 7 years ago  ??  -Unclear cause of recurrent fever for last 7 years   -No risk factors like international travel, pets, IVDU, bug bite/mosquito bite or clinical features like weight loss, recurrent rash, arthritis  -Spiked temp of 100.5 yesterday but with normal white count and now clinically better.  Asked to obtain blood culture if spikes fever again  -Continue on Zosyn while monitoring cultures   -Currently on Zosyn-should be broad enough pending culture results  -Need records from [facility] to review  -Await input from GI regarding MRI findings   -2D echo without any definite valvular lesion.   Await serology for Coxiella and Brucella antibodies  -Await Fungitell and wu for connective tissue disorder   -ESR 55 and CRP 104, active RF, await complement levels, ANA  -Watch for any development of diarrhea given history of C. difficile as on broad-spectrum antibiotics and also chronic suppressive antibiotics with?  Keflex  -Avoid nephrotoxic medication and dose for current creatinine clearance  -We will monitor results and decide further plan based on them and clinical response                 MICROBIOLOGY:   ??  11/23   blood culture x2 NTD              Urine culture negative  ??  LINES AND CATHETERS:   ??  piv      SUBJECTIVE :     Interval notes reviewed.  Patient currently afebrile.  He spiked a fever of 100.5 yesterday.  Feeling better.  Denies any nausea vomiting or diarrhea.  No abdominal pain.  No rash.  Wife at bedside and updates given.    OBJECTIVE     Visit Vitals  BP 110/60 (BP 1 Location: Right arm, BP Patient Position: Supine)   Pulse (!) 52   Temp 97.4 ??F (36.3 ??C)   Resp 17   Ht '5\' 8"'$  (1.727 m)   Wt 67 kg (147 lb 11.3 oz)   SpO2 96%   BMI 22.46 kg/m??       Temp (24hrs), Avg:98.7 ??F (37.1 ??C), Min:97.3 ??F (36.3 ??C), Max:100.5 ??F (  38.1 ??C)      Gen:No distress  HENT: No thrush  Chest: Symmetric expansion, CTA  CV:S1S2 RR, No JVD, No edema  FIE:PPIR, NT, ND, BS+  Skin:No jaundice, cyanosis, clubbing. No decubitus  Muscsk: Normal muscle tone/strength      MEDICATIONS:     Current Facility-Administered Medications   Medication Dose Route Frequency   ??? ketorolac (TORADOL) injection 15 mg  15 mg IntraVENous Q6H PRN   ??? dextrose 5 % - 0.45% NaCl infusion  75 mL/hr IntraVENous CONTINUOUS   ??? allopurinol (ZYLOPRIM) tablet 100 mg  100 mg Oral DAILY   ??? amLODIPine (NORVASC) tablet 5 mg  5 mg Oral DAILY   ??? aspirin chewable tablet 81 mg  81 mg Oral DAILY   ??? gabapentin (NEURONTIN) capsule 600 mg  600 mg Oral BID   ??? metoprolol tartrate (LOPRESSOR) tablet 25 mg  25 mg Oral DAILY   ??? tamsulosin (FLOMAX) capsule 0.4 mg  0.4 mg Oral DAILY   ??? tiZANidine (ZANAFLEX) tablet 4 mg  4 mg Oral QHS    ??? sodium chloride (NS) flush 5-10 mL  5-10 mL IntraVENous Q8H   ??? sodium chloride (NS) flush 5-10 mL  5-10 mL IntraVENous PRN   ??? naloxone (NARCAN) injection 0.1 mg  0.1 mg IntraVENous PRN   ??? acetaminophen (TYLENOL) tablet 650 mg  650 mg Oral Q6H PRN   ??? alum-mag hydroxide-simeth (MYLANTA) oral suspension 30 mL  30 mL Oral Q4H PRN   ??? albuterol (PROVENTIL VENTOLIN) nebulizer solution 2.5 mg  2.5 mg Nebulization Q6H PRN   ??? piperacillin-tazobactam (ZOSYN) 3.375 g in 0.9% sodium chloride (MBP/ADV) 100 mL MBP  3.375 g IntraVENous Q8H   ??? enoxaparin (LOVENOX) injection 40 mg  40 mg SubCUTAneous Q24H       Labs: Results:   Chemistry Recent Labs     01/19/18  0321 01/18/18  0509 01/17/18  1634   GLU 121* 119* 155*   NA 138 141 140   K 4.2 4.8 4.1   CL 108* 112* 111*   CO2 23 23 20*   BUN 18 21 26*   CREA 1.5* 1.5* 1.5*   CA 8.5 8.3* 9.0   AGAP _0 AP  --   --  141*   TP  --   --  8.1   ALB  --   --  3.8      CBC w/Diff Recent Labs     01/19/18  0321 01/18/18  0509 01/17/18  1634   WBC 7.0 6.6 7.4   RBC 3.36* 3.41* 3.93   HGB 8.5* 8.7* 10.1*   HCT 28.4* 29.7* 32.5*   PLT 104* 101* 141   GRANS 68.2* 67.2* 83.4*   LYMPH 17.4* 17.1* 6.8*   EOS 2.2 1.2 0.4          RADIOLOGY :          Imaging  Results from Hospital Encounter encounter on 01/17/18   XR CHEST SNGL V    Narrative EXAM:  Chest AP    INDICATIONS: sepsis     COMPARISON: None.    FINDINGS:     No consolidation, pleural effusion, or pulmonary edema. No pneumothorax.    Normal cardiomediastinal contours. Median sternotomy wires and changes  post-CABG.      Impression IMPRESSION:   No acute cardiopulmonary process.         Results from [place] encounter on 01/17/18   CT CHEST WO  CONT    Narrative EXAMINATION: CT CHEST WO CONT    CLINICAL INDICATION: Foreign object assessment    COMPARISON:  None.    TECHNIQUE: All CT exams at this facility use one or more dose reduction  techniques including automatic exposure control, ma/kV  adjustment per patient's   size, or iterative reconstruction technique. DICOM format image data available  to non-affiliated external healthcare facilities or entities on a secure, media  free, reciprocal searchable basis with patient authorization for 12 months  following the date of the study.    Multiple contiguous axial CT images were obtained at 5 mm increments from the  apex of the lungs to the lung bases without contrast    FINDINGS:   Lungs/Soft tissue: Moderate bibasilar pleural thickening and subpleural  atelectasis versus scarring. No intraluminal radiopaque foreign body identified  within the tracheobronchial tree. Sternotomy wires.     Vascular: Unremarkable.    Mediastinum:  Lymph nodes normal size.  No masses.    Thyroid : Unremarkable    Axilla: No adenopathy seen in axilla,      Bones: No significant bony lesions.    Miscellaneous: None    Upper abdomen:  Pneumobilia. Surgical clips gallbladder fossa. Punctate  bilateral renal stones, partially visualized      Impression IMPRESSION:     1. No intraluminal radiopaque foreign body identified within the  tracheobronchial tree.   2. Moderate bibasal pleural thickening and atelectasis/scarring  3. Nonobstructive punctate bilateral renal stones  4. Pneumobilia       I have independently examined the patient and reviewed lab studies and imgaing as well as review of nursing notes and physican notes from the past 24 hours.    Dragon medical dictation software was used for portions of this report. Unintended errors may occur.     [name], MD  January 20, 2018    [facility]  [phone]

## 2018-01-20 NOTE — Progress Notes (Signed)
Problem: Pain  Goal: *Control of Pain  Outcome: Progressing Towards Goal     Problem: Falls - Risk of  Goal: *Absence of Falls  Description  Document Tommy Brown Fall Risk and appropriate interventions in the flowsheet.  Outcome: Progressing Towards Goal  Note: Fall Risk Interventions:  Mobility Interventions: Patient to call before getting OOB         Medication Interventions: Patient to call before getting OOB, Teach patient to arise slowly         History of Falls Interventions: Bed/chair exit alarm

## 2018-01-20 NOTE — Progress Notes (Signed)
Medicine Progress Note    Patient: Tommy Rutter Sr.   Age:  72 y.o.  DOA: 01/17/2018     LOS:  LOS: 3 days     Assessment/Plan   Sepsis likely from intra-abdominal source  Acute kidney injury  Metabolic acidosis  Elevated LFT  Hypertension  Coronary artery disease status post CABG  Pancreatic cancer status post Whipple surgery  BPH    -MRI of abdomen reviewed, unrevealing for source of reported bloodstream infections in past, GI has signed off  -Continue empiric antibiotics, blood cultures so far no growth to date  -ID consult appreciated, note work-up for other etiologies of fever including autoimmune    Subjective:   Patient seen and examined. No complaints seen, as febrile with diaphoresis earlier this morning no N/V, CP or SOB  Wife at bedside    Objective:     Visit Vitals  BP 138/66 (BP 1 Location: Left arm, BP Patient Position: Sitting)   Pulse 67   Temp 98.3 ??F (36.8 ??C)   Resp 16   Ht 5\' 8"  (1.727 m)   Wt 67 kg (147 lb 11.3 oz)   SpO2 96%   BMI 22.46 kg/m??       Physical Exam:  General appearance: alert, cooperative, no distress, appears stated age  Head: Normocephalic, without obvious abnormality, atraumatic  Neck: supple, trachea midline  Lungs: clear to auscultation bilaterally  Heart: regular rate and rhythm, S1, S2 normal, no murmur, click, rub or gallop  Abdomen: soft, non-tender. Bowel sounds normal. No masses,  no organomegaly  Extremities: extremities normal, atraumatic, no cyanosis or edema  Skin: Skin color, texture, turgor normal. No rashes or lesions in exposed areas       Medications Reviewed:  Current Facility-Administered Medications   Medication Dose Route Frequency   ??? ketorolac (TORADOL) injection 15 mg  15 mg IntraVENous Q6H PRN   ??? dextrose 5 % - 0.45% NaCl infusion  75 mL/hr IntraVENous CONTINUOUS   ??? allopurinol (ZYLOPRIM) tablet 100 mg  100 mg Oral DAILY   ??? amLODIPine (NORVASC) tablet 5 mg  5 mg Oral DAILY   ??? aspirin chewable tablet 81 mg  81 mg Oral DAILY    ??? gabapentin (NEURONTIN) capsule 600 mg  600 mg Oral BID   ??? metoprolol tartrate (LOPRESSOR) tablet 25 mg  25 mg Oral DAILY   ??? tamsulosin (FLOMAX) capsule 0.4 mg  0.4 mg Oral DAILY   ??? tiZANidine (ZANAFLEX) tablet 4 mg  4 mg Oral QHS   ??? sodium chloride (NS) flush 5-10 mL  5-10 mL IntraVENous Q8H   ??? sodium chloride (NS) flush 5-10 mL  5-10 mL IntraVENous PRN   ??? naloxone (NARCAN) injection 0.1 mg  0.1 mg IntraVENous PRN   ??? acetaminophen (TYLENOL) tablet 650 mg  650 mg Oral Q6H PRN   ??? alum-mag hydroxide-simeth (MYLANTA) oral suspension 30 mL  30 mL Oral Q4H PRN   ??? albuterol (PROVENTIL VENTOLIN) nebulizer solution 2.5 mg  2.5 mg Nebulization Q6H PRN   ??? piperacillin-tazobactam (ZOSYN) 3.375 g in 0.9% sodium chloride (MBP/ADV) 100 mL MBP  3.375 g IntraVENous Q8H   ??? enoxaparin (LOVENOX) injection 40 mg  40 mg SubCUTAneous Q24H         Intake and Output:  Current Shift:  11/26 0701 - 11/26 1900  In: 240 [P.O.:240]  Out: -   Last three shifts:  11/24 1901 - 11/26 0700  In: 2845 [P.O.:720; I.V.:2125]  Out: 1100 [Urine:1100]  Lab/Data Reviewed:  All lab and imaging  results for the last 24 hours reviewed.    Stephanie Coup, MD  P# [phone]  January 20, 2018    [employer] medical dictation software was used for portions of this report.  Unintended voice transcription errors may have occurred.

## 2018-01-20 NOTE — Progress Notes (Signed)
Problem: Pain  Goal: *Control of Pain  Outcome: Progressing Towards Goal     Problem: Falls - Risk of  Goal: *Absence of Falls  Description  Document Tommy Brown Fall Risk and appropriate interventions in the flowsheet.  Outcome: Progressing Towards Goal  Note: Fall Risk Interventions:  Mobility Interventions: Strengthening exercises (ROM-active/passive), Bed/chair exit alarm, Patient to call before getting OOB         Medication Interventions: Assess postural VS orthostatic hypotension, Bed/chair exit alarm, Patient to call before getting OOB, Teach patient to arise slowly         History of Falls Interventions: Vital signs minimum Q4HRs X 24 hrs (comment for end date)

## 2018-01-20 NOTE — Progress Notes (Signed)
Problem: Pain  Goal: *Control of Pain  Outcome: Progressing Towards Goal     Problem: Falls - Risk of  Goal: *Absence of Falls  Description  Document Patrcia Dolly Fall Risk and appropriate interventions in the flowsheet.  Outcome: Progressing Towards Goal  Note: Fall Risk Interventions:            Medication Interventions: Assess postural VS orthostatic hypotension, Bed/chair exit alarm, Evaluate medications/consider consulting pharmacy, Patient to call before getting OOB, Teach patient to arise slowly, Utilize gait belt for transfers/ambulation         History of Falls Interventions: Bed/chair exit alarm, Consult care management for discharge planning, Door open when patient unattended, Evaluate medications/consider consulting pharmacy, Investigate reason for fall, Room close to nurse's station, Utilize gait belt for transfer/ambulation, Assess for delayed presentation/identification of injury for 48 hrs (comment for end date), Vital signs minimum Q4HRs X 24 hrs (comment for end date)

## 2018-01-20 NOTE — Progress Notes (Signed)
Problem: Pain  Goal: *Control of Pain  Outcome: Progressing Towards Goal     Problem: Falls - Risk of  Goal: *Absence of Falls  Description  Document Tommy Brown Fall Risk and appropriate interventions in the flowsheet.  Outcome: Progressing Towards Goal  Note: Fall Risk Interventions:            Medication Interventions: Assess postural VS orthostatic hypotension, Bed/chair exit alarm, Evaluate medications/consider consulting pharmacy, Patient to call before getting OOB, Teach patient to arise slowly, Utilize gait belt for transfers/ambulation         History of Falls Interventions: Bed/chair exit alarm, Consult care management for discharge planning, Door open when patient unattended, Evaluate medications/consider consulting pharmacy, Investigate reason for fall, Room close to nurse's station, Utilize gait belt for transfer/ambulation, Assess for delayed presentation/identification of injury for 48 hrs (comment for end date), Vital signs minimum Q4HRs X 24 hrs (comment for end date)

## 2018-01-20 NOTE — Progress Notes (Signed)
Progress  Notes by [name], MD at 01/20/18 [phone]                Author: [name], MD  Service: Infectious Disease  Author Type: Physician       Filed: 01/20/18 0926  Date of Service: 01/20/18 0737  Status: Signed          Editor: [name], MD (Physician)                                                                                INFECTIOUS DISEASE FOLLOW UP NOTE :-       Admit Date: 01/17/2018      Date of admission: 01/17/2018   ??   Date of consult: January 19, 2018   ??   ??     ABX:     ??      Current abx  Prior abx      Zosyn 11/23-3  ??     ??     ASSESSMENT:      ??     Recurrent fever with chills   - no definite source clinically   - ?sec to sec to Whipple's dis     AKI versus CKD     Anemia     Transaminitis-mild     2 Small indeterminate foci of ringlike hepatic enhancement-seen on MRI   - Nonspecific endings   -??  Microabscesses or metastatic lesions vs normal heterogenous process     History of recurrent fever and chills every 2 months   -Started since Whipple surgery and had grown Klebsiella few times   -Per patient secondary to infected pouch   -Previous work-up done with negative results     Pancreatic cancer status post Whipple resection in 2012 and incomplete adjuvant chemotherapy     Hyperglycemia     History of kidney stones     History of carotid endarterectomy     CAD status post CABG     History of C. difficile     ??     ??   ??     RECOMMENDATIONS:     ??   This is very interesting but complicated patient with history of recurrent fever and chills every couple of months since his Whipple surgery 7 years ago   ??   -Unclear cause of recurrent fever for last 7 years    -No risk factors like international travel, pets, IVDU, bug bite/mosquito bite or clinical features like weight loss, recurrent rash, arthritis   -Spiked temp of 100.5 yesterday but with normal white count and now clinically better.  Asked to obtain blood culture if spikes fever again   -Continue on Zosyn while  monitoring cultures    -Currently on Zosyn-should be broad enough pending culture results   -Need records from [facility] to review   -Await input from GI regarding MRI findings    -2D echo without any definite valvular lesion.   Await serology for Coxiella and Brucella antibodies   -Await Fungitell and wu for connective tissue disorder   -ESR 55 and CRP 104, active RF, await complement levels, ANA   -Watch for any  development of diarrhea given history of C. difficile as on broad-spectrum antibiotics and also chronic suppressive antibiotics with?  Keflex   -Avoid nephrotoxic medication and dose for current creatinine clearance   -We will monitor results and decide further plan based on them and clinical response                     MICROBIOLOGY:     ??   11/23   blood culture x2 NTD               Urine culture negative   ??     LINES AND CATHETERS:     ??   piv           SUBJECTIVE :        Interval notes reviewed.  Patient currently afebrile.  He spiked a fever of 100.5 yesterday.  Feeling better.  Denies any nausea vomiting or diarrhea.  No abdominal pain.  No rash.  Wife at bedside and updates given.        OBJECTIVE        Visit Vitals      BP  110/60 (BP 1 Location: Right arm, BP Patient Position: Supine)     Pulse  (!) 52     Temp  97.4 ??F (36.3 ??C)     Resp  17     Ht  5' 8"  (1.727 m)     Wt  67 kg (147 lb 11.3 oz)     SpO2  96%        BMI  22.46 kg/m??           Temp (24hrs), Avg:98.7 ??F (37.1 ??C), Min:97.3 ??F (36.3 ??C), Max:100.5 ??F (38.1 ??C)         Gen:No distress   HENT: No thrush   Chest: Symmetric expansion, CTA   CV:S1S2 RR, No JVD, No edema   FAO:ZHYQ, NT, ND, BS+   Skin:No jaundice, cyanosis, clubbing. No decubitus   Muscsk: Normal muscle tone/strength           MEDICATIONS:          Current Facility-Administered Medications          Medication  Dose  Route  Frequency           ?  ketorolac (TORADOL) injection 15 mg   15 mg  IntraVENous  Q6H PRN     ?  dextrose 5 % - 0.45% NaCl infusion   75  mL/hr  IntraVENous  CONTINUOUS     ?  allopurinol (ZYLOPRIM) tablet 100 mg   100 mg  Oral  DAILY     ?  amLODIPine (NORVASC) tablet 5 mg   5 mg  Oral  DAILY     ?  aspirin chewable tablet 81 mg   81 mg  Oral  DAILY     ?  gabapentin (NEURONTIN) capsule 600 mg   600 mg  Oral  BID     ?  metoprolol tartrate (LOPRESSOR) tablet 25 mg   25 mg  Oral  DAILY     ?  tamsulosin (FLOMAX) capsule 0.4 mg   0.4 mg  Oral  DAILY     ?  tiZANidine (ZANAFLEX) tablet 4 mg   4 mg  Oral  QHS     ?  sodium chloride (NS) flush 5-10 mL   5-10 mL  IntraVENous  Q8H     ?  sodium chloride (NS) flush 5-10 mL  5-10 mL  IntraVENous  PRN     ?  naloxone (NARCAN) injection 0.1 mg   0.1 mg  IntraVENous  PRN     ?  acetaminophen (TYLENOL) tablet 650 mg   650 mg  Oral  Q6H PRN     ?  alum-mag hydroxide-simeth (MYLANTA) oral suspension 30 mL   30 mL  Oral  Q4H PRN           ?  albuterol (PROVENTIL VENTOLIN) nebulizer solution 2.5 mg   2.5 mg  Nebulization  Q6H PRN           ?  piperacillin-tazobactam (ZOSYN) 3.375 g in 0.9% sodium chloride (MBP/ADV) 100 mL MBP   3.375 g  IntraVENous  Q8H           ?  enoxaparin (LOVENOX) injection 40 mg   40 mg  SubCUTAneous  Q24H              Labs:  Results:        Chemistry  Recent Labs         01/19/18   0321  01/18/18   0509  01/17/18   1634      GLU  121*  119*  155*      NA  138  141  140      K  4.2  4.8  4.1      CL  108*  112*  111*      CO2  23  23  20*      BUN  18  21  26*      CREA  1.5*  1.5*  1.5*      CA  8.5  8.3*  9.0      AGAP  6  6  9       AP   --    --   141*      TP   --    --   8.1      ALB   --    --   3.8                 CBC w/Diff  Recent Labs         01/19/18   0321  01/18/18   0509  01/17/18   1634      WBC  7.0  6.6  7.4      RBC  3.36*  3.41*  3.93      HGB  8.5*  8.7*  10.1*      HCT  28.4*  29.7*  32.5*      PLT  104*  101*  141      GRANS  68.2*  67.2*  83.4*      LYMPH  17.4*  17.1*  6.8*      EOS  2.2  1.2  0.4                      RADIOLOGY :               Imaging     Results from  Hospital Encounter encounter on 01/17/18     XR CHEST SNGL V           Narrative  EXAM:  Chest AP      INDICATIONS: sepsis       COMPARISON: None.      FINDINGS:       No consolidation, pleural  effusion, or pulmonary edema. No pneumothorax.      Normal cardiomediastinal contours. Median sternotomy wires and changes   post-CABG.              Impression  IMPRESSION:    No acute cardiopulmonary process.                Results from [place] encounter on 01/17/18     CT CHEST WO CONT           Narrative  EXAMINATION: CT CHEST WO CONT      CLINICAL INDICATION: Foreign object assessment      COMPARISON:  None.      TECHNIQUE: All CT exams at this facility use one or more dose reduction   techniques including automatic exposure control, ma/kV  adjustment per patient's   size, or iterative reconstruction technique. DICOM format image data available   to non-affiliated external healthcare facilities or entities on a secure, media   free, reciprocal searchable basis with patient authorization for 12 months   following the date of the study.      Multiple contiguous axial CT images were obtained at 5 mm increments from the   apex of the lungs to the lung bases without contrast      FINDINGS:    Lungs/Soft tissue: Moderate bibasilar pleural thickening and subpleural   atelectasis versus scarring. No intraluminal radiopaque foreign body identified   within the tracheobronchial tree. Sternotomy wires.       Vascular: Unremarkable.      Mediastinum:  Lymph nodes normal size.  No masses.      Thyroid : Unremarkable      Axilla: No adenopathy seen in axilla,        Bones: No significant bony lesions.      Miscellaneous: None      Upper abdomen:  Pneumobilia. Surgical clips gallbladder fossa. Punctate   bilateral renal stones, partially visualized              Impression  IMPRESSION:       1. No intraluminal radiopaque foreign body identified within the   tracheobronchial tree.    2. Moderate bibasal pleural thickening and  atelectasis/scarring   3. Nonobstructive punctate bilateral renal stones   4. Pneumobilia           I have independently examined the patient and reviewed lab studies and imgaing as well as review of nursing notes and physican notes from the past 24 hours.      Dragon medical dictation software was used for portions of this report. Unintended errors may occur.       [name], MD   January 20, 2018      [facility]   [phone]

## 2018-01-20 NOTE — Progress Notes (Signed)
Medicine Progress Note    Patient: Tommy Rutter Sr.   Age:  72 y.o.  DOA: 01/17/2018     LOS:  LOS: 3 days     Assessment/Plan   Sepsis likely from intra-abdominal source  Acute kidney injury  Metabolic acidosis  Elevated LFT  Hypertension  Coronary artery disease status post CABG  Pancreatic cancer status post Whipple surgery  BPH    -MRI of abdomen reviewed, unrevealing for source of reported bloodstream infections in past, GI has signed off  -Continue empiric antibiotics, blood cultures so far no growth to date  -ID consult appreciated, note work-up for other etiologies of fever including autoimmune    Subjective:   Patient seen and examined. No complaints seen, as febrile with diaphoresis earlier this morning no N/V, CP or SOB  Tommy Brown at bedside    Objective:     Visit Vitals  BP 138/66 (BP 1 Location: Left arm, BP Patient Position: Sitting)   Pulse 67   Temp 98.3 ??F (36.8 ??C)   Resp 16   Ht 5\' 8"  (1.727 m)   Wt 67 kg (147 lb 11.3 oz)   SpO2 96%   BMI 22.46 kg/m??       Physical Exam:  General appearance: alert, cooperative, no distress, appears stated age  Head: Normocephalic, without obvious abnormality, atraumatic  Neck: supple, trachea midline  Lungs: clear to auscultation bilaterally  Heart: regular rate and rhythm, S1, S2 normal, no murmur, click, rub or gallop  Abdomen: soft, non-tender. Bowel sounds normal. No masses,  no organomegaly  Extremities: extremities normal, atraumatic, no cyanosis or edema  Skin: Skin color, texture, turgor normal. No rashes or lesions in exposed areas       Medications Reviewed:  Current Facility-Administered Medications   Medication Dose Route Frequency   ??? ketorolac (TORADOL) injection 15 mg  15 mg IntraVENous Q6H PRN   ??? dextrose 5 % - 0.45% NaCl infusion  75 mL/hr IntraVENous CONTINUOUS   ??? allopurinol (ZYLOPRIM) tablet 100 mg  100 mg Oral DAILY   ??? amLODIPine (NORVASC) tablet 5 mg  5 mg Oral DAILY   ??? aspirin chewable tablet 81 mg  81 mg Oral DAILY   ??? gabapentin  (NEURONTIN) capsule 600 mg  600 mg Oral BID   ??? metoprolol tartrate (LOPRESSOR) tablet 25 mg  25 mg Oral DAILY   ??? tamsulosin (FLOMAX) capsule 0.4 mg  0.4 mg Oral DAILY   ??? tiZANidine (ZANAFLEX) tablet 4 mg  4 mg Oral QHS   ??? sodium chloride (NS) flush 5-10 mL  5-10 mL IntraVENous Q8H   ??? sodium chloride (NS) flush 5-10 mL  5-10 mL IntraVENous PRN   ??? naloxone (NARCAN) injection 0.1 mg  0.1 mg IntraVENous PRN   ??? acetaminophen (TYLENOL) tablet 650 mg  650 mg Oral Q6H PRN   ??? alum-mag hydroxide-simeth (MYLANTA) oral suspension 30 mL  30 mL Oral Q4H PRN   ??? albuterol (PROVENTIL VENTOLIN) nebulizer solution 2.5 mg  2.5 mg Nebulization Q6H PRN   ??? piperacillin-tazobactam (ZOSYN) 3.375 g in 0.9% sodium chloride (MBP/ADV) 100 mL MBP  3.375 g IntraVENous Q8H   ??? enoxaparin (LOVENOX) injection 40 mg  40 mg SubCUTAneous Q24H         Intake and Output:  Current Shift:  11/26 0701 - 11/26 1900  In: 240 [P.O.:240]  Out: -   Last three shifts:  11/24 1901 - 11/26 0700  In: 2845 [P.O.:720; I.V.:2125]  Out: 1100 [Urine:1100]  Lab/Data Reviewed:  All lab and imaging  results for the last 24 hours reviewed.    Stephanie Coup, MD  P# [phone]  January 20, 2018    [facility] medical dictation software was used for portions of this report.  Unintended voice transcription errors may have occurred.

## 2018-01-21 LAB — FUNGITELL
(1-3)-B-D Glucan Result: <31 pg/mL (ref <60)
(1-3)-B-D-Glucan: <31 pg/mL (ref <60)
INTERPRETATION: NEGATIVE
Interpretation: NEGATIVE

## 2018-01-21 LAB — COMPLEMENT, C3 & C4
COMPLEMENT, C4,C4: 30 mg/dL (ref 15–53)
COMPLEMENT,C4: 30 mg/dL (ref 15–53)
Complement C3: 156 mg/dL (ref 82–185)
Complement C3: 156 mg/dL (ref 82–185)

## 2018-01-21 MED ORDER — AMOXICILLIN CLAVULANATE 875 MG-125 MG TAB
875-125 mg | Freq: Two times a day (BID) | ORAL | Status: DC
Start: 2018-01-21 — End: 2018-01-21
  Administered 2018-01-21: 16:00:00 via ORAL

## 2018-01-21 MED ORDER — AMOXICILLIN CLAVULANATE 875 MG-125 MG TAB
875-125 mg | ORAL_TABLET | Freq: Two times a day (BID) | ORAL | 0 refills | Status: DC
Start: 2018-01-21 — End: 2018-06-04

## 2018-01-21 MED FILL — METOPROLOL TARTRATE 25 MG TAB: 25 mg | ORAL | Qty: 1

## 2018-01-21 MED FILL — AMLODIPINE 5 MG TAB: 5 mg | ORAL | Qty: 1

## 2018-01-21 MED FILL — ASPIRIN 81 MG CHEWABLE TAB: 81 mg | ORAL | Qty: 1

## 2018-01-21 MED FILL — AMOXICILLIN CLAVULANATE 875 MG-125 MG TAB: 875-125 mg | ORAL | Qty: 1

## 2018-01-21 MED FILL — BD POSIFLUSH NORMAL SALINE 0.9 % INJECTION SYRINGE: INTRAMUSCULAR | Qty: 10

## 2018-01-21 MED FILL — LOVENOX 40 MG/0.4 ML SUBCUTANEOUS SYRINGE: 40 mg/0.4 mL | SUBCUTANEOUS | Qty: 0.4

## 2018-01-21 MED FILL — PIPERACILLIN-TAZOBACTAM 3.375 GRAM IV SOLR: 3.375 gram | INTRAVENOUS | Qty: 3.38

## 2018-01-21 MED FILL — TIZANIDINE 4 MG TAB: 4 mg | ORAL | Qty: 1

## 2018-01-21 MED FILL — GABAPENTIN 300 MG CAP: 300 mg | ORAL | Qty: 2

## 2018-01-21 MED FILL — TAMSULOSIN SR 0.4 MG 24 HR CAP: 0.4 mg | ORAL | Qty: 1

## 2018-01-21 MED FILL — ALLOPURINOL 100 MG TAB: 100 mg | ORAL | Qty: 1

## 2018-01-21 NOTE — Other (Addendum)
----------  DocumentID: [id number]------------------------------------------------              [facility]                       Patient Education Report         Name: Tommy Brown, Tommy Brown                  Date: 01/17/2018    MRN: [id number]                    Time: 8:21:16 PM         Patient ordered video: 'Patient Safety: Stay Safe While you are in the Hospital'    from 2EST_2125_1 via phone number: [phone] at 8:21:16 PM    Description: This program outlines some of the precautions patients can take to ensure a speedy recovery without extra complications. The video emphasizes the importance of communicating with the healthcare team.    ----------DocumentID: [id number]------------------------------------------------                       [facility]          Patient Education Report - Discharge Summary        Date: 01/21/2018   Time: 12:24:02 PM   Name: Tommy Brown, Tommy Brown   MRN: [id number]      Account Number: [id number]      Education History:        Patient ordered video: 'Patient Safety: Stay Safe While you are in the Hospital' from 2EST_2125_1 on 01/17/2018 08:21:16 PM

## 2018-01-21 NOTE — Progress Notes (Signed)
INFECTIOUS DISEASE FOLLOW UP NOTE :-     Admit Date: 01/17/2018    Date of admission: 01/17/2018  ??  Date of consult: January 19, 2018  ??  ??  ABX:   ??  Current abx Prior abx    Augmentin 11/27-0 ??Zosyn 11/23-4   ??  ASSESSMENT:    ??  Recurrent fever with chills  - no definite source clinically  - ?sec to sec to Whipple's dis   AKI versus CKD   Anemia   Transaminitis-mild   2 Small indeterminate foci of ringlike hepatic enhancement-seen on MRI  - Nonspecific endings  -??  Microabscesses or metastatic lesions vs normal heterogenous process   History of recurrent fever and chills every 2 months  -Started since Whipple surgery and had grown Klebsiella few times  -Per patient secondary to infected pouch  -Previous work-up done with negative results   Pancreatic cancer status post Whipple resection in 2012 and incomplete adjuvant chemotherapy   Hyperglycemia   History of kidney stones   History of carotid endarterectomy   CAD status post CABG   History of C. difficile   ??   ??  ??  RECOMMENDATIONS:   ??  This is very interesting but complicated patient with history of recurrent fever and chills every couple of months since his Whipple surgery 7 years ago  ??  -Unclear cause of recurrent fever for last 7 years   -No risk factors like international travel, pets, IVDU, bug bite/mosquito bite or clinical features like weight loss, recurrent rash, arthritis  -Afebrile for >24hrs now- had similar presentation per him in past  -remains with normal white count   -Cx remains neg  -on Zosyn but will suggest to switch to oral Augmentin for 3 more days  -Asked to not take suppressive cefuroxime that he was doing prior to admission   -Records from [facility] reviewed and previous episodes of fever were thought either due to kidney stones or Sump Syndrome (obstruction of food, stones, or debris in patients with a biliary-enteric anastomosis) but never reported to have any definite organism  -GI not planning any other intervention  -2D echo without any definite valvular lesion.    -Await serology for Coxiella and Brucella antibodies.Await Fungitell and wu for connective tissue disorder  -ESR 55 and CRP 104, neg RF, await complement levels, ANA-to follow-up in ID office for follow-up results  -Watch for any development of diarrhea given history of C. difficile as on broad-spectrum antibiotics and also chronic suppressive antibiotics with?  Keflex  -Avoid nephrotoxic medication and dose for current creatinine clearance  -We will monitor results and decide further plan based on them and clinical response  -d/w Dr Nolen Mu    -We will check back on Friday still in the hospital                 MICROBIOLOGY:   ??  11/23   blood culture x2 NTD              Urine culture negative  ??  LINES AND CATHETERS:   ??  piv      SUBJECTIVE :     Interval notes reviewed.  Patient about.  Denies any nausea vomiting abdominal pain or diarrhea.  Culture updates given and remains negative.  Discussed with him that I reviewed the records from his old hospitals and there was no definite diagnosis of finding.  Discussed with him to go to tertiary care center for further evaluation  if connective tissue work-up is also negative.    OBJECTIVE     Visit Vitals  BP 109/62 (BP 1 Location: Right arm, BP Patient Position: Supine)   Pulse (!) 56   Temp 97.9 ??F (36.6 ??C)   Resp 18   Ht '5\' 8"'  (1.727 m)   Wt 67 kg (147 lb 11.3 oz)   SpO2 99%   BMI 22.46 kg/m??       Temp (24hrs), Avg:98.4 ??F (36.9 ??C), Min:97.7 ??F (36.5 ??C), Max:99.6 ??F (37.6 ??C)      Gen:No distress  HENT: No thrush  Chest: Symmetric expansion, CTA  CV:S1S2 RR, No JVD, No edema  GYI:RSWN, NT, ND, BS+   Skin:No jaundice, cyanosis, clubbing. No decubitus  Muscsk: Normal muscle tone/strength      MEDICATIONS:     Current Facility-Administered Medications   Medication Dose Route Frequency   ??? ketorolac (TORADOL) injection 15 mg  15 mg IntraVENous Q6H PRN   ??? dextrose 5 % - 0.45% NaCl infusion  75 mL/hr IntraVENous CONTINUOUS   ??? allopurinol (ZYLOPRIM) tablet 100 mg  100 mg Oral DAILY   ??? amLODIPine (NORVASC) tablet 5 mg  5 mg Oral DAILY   ??? aspirin chewable tablet 81 mg  81 mg Oral DAILY   ??? gabapentin (NEURONTIN) capsule 600 mg  600 mg Oral BID   ??? metoprolol tartrate (LOPRESSOR) tablet 25 mg  25 mg Oral DAILY   ??? tamsulosin (FLOMAX) capsule 0.4 mg  0.4 mg Oral DAILY   ??? tiZANidine (ZANAFLEX) tablet 4 mg  4 mg Oral QHS   ??? sodium chloride (NS) flush 5-10 mL  5-10 mL IntraVENous Q8H   ??? sodium chloride (NS) flush 5-10 mL  5-10 mL IntraVENous PRN   ??? naloxone (NARCAN) injection 0.1 mg  0.1 mg IntraVENous PRN   ??? acetaminophen (TYLENOL) tablet 650 mg  650 mg Oral Q6H PRN   ??? alum-mag hydroxide-simeth (MYLANTA) oral suspension 30 mL  30 mL Oral Q4H PRN   ??? albuterol (PROVENTIL VENTOLIN) nebulizer solution 2.5 mg  2.5 mg Nebulization Q6H PRN   ??? piperacillin-tazobactam (ZOSYN) 3.375 g in 0.9% sodium chloride (MBP/ADV) 100 mL MBP  3.375 g IntraVENous Q8H   ??? enoxaparin (LOVENOX) injection 40 mg  40 mg SubCUTAneous Q24H       Labs: Results:   Chemistry Recent Labs     01/19/18  0321   GLU 121*   NA 138   K 4.2   CL 108*   CO2 23   BUN 18   CREA 1.5*   CA 8.5   AGAP 6      CBC w/Diff Recent Labs     01/19/18  0321   WBC 7.0   RBC 3.36*   HGB 8.5*   HCT 28.4*   PLT 104*   GRANS 68.2*   LYMPH 17.4*   EOS 2.2          RADIOLOGY :          Imaging  Results from Hospital Encounter encounter on 01/17/18   XR CHEST SNGL V    Narrative EXAM:  Chest AP    INDICATIONS: sepsis     COMPARISON: None.    FINDINGS:     No consolidation, pleural effusion, or pulmonary edema. No pneumothorax.     Normal cardiomediastinal contours. Median sternotomy wires and changes  post-CABG.      Impression IMPRESSION:   No acute cardiopulmonary process.  Results from [place] encounter on 01/17/18   CT CHEST WO CONT    Narrative EXAMINATION: CT CHEST WO CONT    CLINICAL INDICATION: Foreign object assessment    COMPARISON:  None.    TECHNIQUE: All CT exams at this facility use one or more dose reduction  techniques including automatic exposure control, ma/kV  adjustment per patient's  size, or iterative reconstruction technique. DICOM format image data available  to non-affiliated external healthcare facilities or entities on a secure, media  free, reciprocal searchable basis with patient authorization for 12 months  following the date of the study.    Multiple contiguous axial CT images were obtained at 5 mm increments from the  apex of the lungs to the lung bases without contrast    FINDINGS:   Lungs/Soft tissue: Moderate bibasilar pleural thickening and subpleural  atelectasis versus scarring. No intraluminal radiopaque foreign body identified  within the tracheobronchial tree. Sternotomy wires.     Vascular: Unremarkable.    Mediastinum:  Lymph nodes normal size.  No masses.    Thyroid : Unremarkable    Axilla: No adenopathy seen in axilla,      Bones: No significant bony lesions.    Miscellaneous: None    Upper abdomen:  Pneumobilia. Surgical clips gallbladder fossa. Punctate  bilateral renal stones, partially visualized      Impression IMPRESSION:     1. No intraluminal radiopaque foreign body identified within the  tracheobronchial tree.   2. Moderate bibasal pleural thickening and atelectasis/scarring  3. Nonobstructive punctate bilateral renal stones  4. Pneumobilia       I have independently examined the patient and reviewed lab studies and imgaing as well as review of nursing notes and physican notes from the past 24 hours.     Dragon medical dictation software was used for portions of this report. Unintended errors may occur.     [name], MD  January 21, 2018    [facility]  [phone]

## 2018-01-21 NOTE — Discharge Summary (Signed)
DISCHARGE SUMMARY     Patient Name: Tommy Brown  Medical Record Number: [id number]  Date of Birth: [birth year]  Discharge Provider: Stephanie Coup, MD  Primary Care Provider: None    Admit date: 01/17/2018  Discharge date: 01/21/2018  Discharge Disposition: Home  Code Status: Full Code    Follow-up appointments:  Follow-up Information     Follow up With Specialties Details Why [place] on 01/27/2018 at10:45AM.  Please bring photo ID insurance card and all medications.  Questions please call [phone] [address]  [facility])  [place]  [phone]    None    None (395) Patient stated that they have no PCP            Follow-up recommendations:  TCC 1 week    Discharge diagnosis:  Sepsis likely from intra-abdominal source  Acute kidney injury  Metabolic acidosis  Elevated LFT  Hypertension  Coronary artery disease status post CABG  Pancreatic cancer status post Whipple surgery  BPH  ??  Patient history of pancreatic cancer status post chemotherapy radiation and Whipple procedure presented emergency department with lethargy and fever.  Patient new to the area but states he has had multiple hospitalizations related to "sump"  syndrome related to his Whipple surgery with occasional gram-negative bacteremia necessitating hospital stay and IV antibiotics.  Patient was admitted to the medicine service started on empiric antibiotics infectious disease was consulted.  Patient continued to improve blood cultures and urine cultures ultimately remain negative and MRCP was obtained of his abdomen that was ultimately unrevealing for source of infection.  Patient will be discharged home to complete empiric course of Augmentin, while admitted patient did have send out labs for other etiologies of recurrent fever such as autoimmune disease easily followed up with infectious disease clinic.               Additional diagnoses :   Past Medical History:    Diagnosis Date   ??? Hx of CABG    ??? Kidney stones    ??? Pancreatic cancer [facility])                                                         Discharge Medications:   Current Discharge Medication List      START taking these medications    Details   amoxicillin-clavulanate (AUGMENTIN) 875-125 mg per tablet Take 1 Tab by mouth every twelve (12) hours.  Qty: 7 Tab, Refills: 0         CONTINUE these medications which have NOT CHANGED    Details   metoprolol tartrate (LOPRESSOR) 25 mg tablet Take 25 mg by mouth daily.      gabapentin (NEURONTIN) 600 mg tablet Take 600 mg by mouth two (2) times a day.      amLODIPine (NORVASC) 5 mg tablet Take 5 mg by mouth daily.      allopurinol (ZYLOPRIM) 300 mg tablet Take  by mouth daily.      tamsulosin (FLOMAX) 0.4 mg capsule Take 0.4 mg by mouth daily.      lipase-protease-amylase (ZENPEP) 20,000-63,000- 84,000 unit capsule Take 8 Caps by mouth daily.      tiZANidine (ZANAFLEX) 4 mg tablet Take 4 mg by mouth nightly.  fluticasone propionate 100 mcg/actuation dsdv Take  by inhalation.      aspirin 81 mg chewable tablet Take 81 mg by mouth daily.      TESTOSTERONE IM by IntraMUSCular route.             Discharge Diet:  Diet: Regular Diet    Consultants: ID    Labs:  Labs: Results:       Chemistry Recent Labs     01/19/18  0321   GLU 121*   NA 138   K 4.2   CL 108*   CO2 23   BUN 18   CREA 1.5*   CA 8.5   AGAP 6      CBC w/Diff Recent Labs     01/19/18  0321   WBC 7.0   RBC 3.36*   HGB 8.5*   HCT 28.4*   PLT 104*   GRANS 68.2*   LYMPH 17.4*   EOS 2.2      Cardiac Enzymes No results for input(s): CPK, CKND1, MYO in the last 72 hours.    No lab exists for component: CKRMB, TROIP   Coagulation No results for input(s): PTP, INR, APTT, INREXT in the last 72 hours.    Lipid Panel No results found for: CHOL, CHOLPOCT, CHOLX, CHLST, CHOLV, 884269, HDL, HDLP, LDL, LDLC, DLDLP, 804564, VLDLC, VLDL, TGLX, TRIGL, TRIGP, TGLPOCT, CHHD, CHHDX    BNP No results for input(s): BNPP in the last 72 hours.   Liver Enzymes No results for input(s): TP, ALB, TBIL, AP, SGOT, GPT in the last 72 hours.    No lab exists for component: DBIL   Thyroid Studies No results found for: T4, T3U, TSH, TSHEXT         History of presenting illness:  Patient had pancreatic cancer at the age of 75 years, had Whipple's done in 2006, surgery was done at [facility] in [place], [place] followed this with chemoradiation which she did not finish and could not tolerate and took only half the dose and stopped last time which she received was 7 years ago  He follows with his oncologist once a year who does CT scans and blood work and last CT scan was done in May 2019 which he was reported to be negative  He also follows up with his family doctor Dr.'s Deforest Hoyles, who has researched and spoken with immunologist who he has seen and told that people who have Whipple surgery done they have high likelihood of having recurrent infections in the near the site of surgery in the old pocket and will need to be treated with IV antibiotics all his life every few months  ??        Physical exam: not performed on day of discharge     Discharge condition:  stable    Discharge activity and restrictions: Activity as tolerated    Time spent with discharging patient: [phone]- total time spent 35 min      Stephanie Coup, MD  01/21/2018  10:39 AM    Dragon medical dictation software was used for portions of this report.  Unintended voice transcription errors may have occurred.

## 2018-01-21 NOTE — Progress Notes (Signed)
Progress  Notes by [name], MD at 01/21/18 [phone]                Author: [name], MD  Service: Infectious Disease  Author Type: Physician       Filed: 01/21/18 0854  Date of Service: 01/21/18 0713  Status: Signed          Editor: [name], MD (Physician)                                                                                INFECTIOUS DISEASE FOLLOW UP NOTE :-       Admit Date: 01/17/2018      Date of admission: 01/17/2018   ??   Date of consult: January 19, 2018   ??   ??     ABX:     ??      Current abx  Prior abx      Augmentin 11/27-0  ??Zosyn 11/23-4     ??     ASSESSMENT:      ??     Recurrent fever with chills   - no definite source clinically   - ?sec to sec to Whipple's dis     AKI versus CKD     Anemia     Transaminitis-mild     2 Small indeterminate foci of ringlike hepatic enhancement-seen on MRI   - Nonspecific endings   -??  Microabscesses or metastatic lesions vs normal heterogenous process     History of recurrent fever and chills every 2 months   -Started since Whipple surgery and had grown Klebsiella few times   -Per patient secondary to infected pouch   -Previous work-up done with negative results     Pancreatic cancer status post Whipple resection in 2012 and incomplete adjuvant chemotherapy     Hyperglycemia     History of kidney stones     History of carotid endarterectomy     CAD status post CABG     History of C. difficile     ??     ??   ??     RECOMMENDATIONS:     ??   This is very interesting but complicated patient with history of recurrent fever and chills every couple of months since his Whipple surgery 7 years ago   ??   -Unclear cause of recurrent fever for last 7 years    -No risk factors like international travel, pets, IVDU, bug bite/mosquito bite or clinical features like weight loss, recurrent rash, arthritis   -Afebrile for >24hrs now- had similar presentation per him in past   -remains with normal white count    -Cx remains neg   -on Zosyn but will suggest to  switch to oral Augmentin for 3 more days   -Asked to not take suppressive cefuroxime that he was doing prior to admission   -Records from [facility] reviewed and previous episodes of fever were thought either due to kidney stones or Sump Syndrome (obstruction of food, stones, or debris in  patients with a biliary-enteric anastomosis) but never reported to have any definite organism   -GI not planning any other intervention   -  2D echo without any definite valvular lesion.     -Await serology for Coxiella and Brucella antibodies.Await Fungitell and wu for connective tissue disorder   -ESR 55 and CRP 104, neg RF, await complement levels, ANA-to follow-up in ID office for follow-up results   -Watch for any development of diarrhea given history of C. difficile as on broad-spectrum antibiotics and also chronic suppressive antibiotics with?  Keflex   -Avoid nephrotoxic medication and dose for current creatinine clearance   -We will monitor results and decide further plan based on them and clinical response   -d/w Dr Nolen Mu      -We will check back on Friday still in the hospital                     MICROBIOLOGY:     ??   11/23   blood culture x2 NTD               Urine culture negative   ??     LINES AND CATHETERS:     ??   piv           SUBJECTIVE :        Interval notes reviewed.  Patient about.  Denies any nausea vomiting abdominal pain or diarrhea.  Culture updates given and remains negative.  Discussed with him that I reviewed the records from his old hospitals and there was no definite diagnosis of  finding.  Discussed with him to go to tertiary care center for further evaluation if connective tissue work-up is also negative.        OBJECTIVE        Visit Vitals      BP  109/62 (BP 1 Location: Right arm, BP Patient Position: Supine)     Pulse  (!) 56     Temp  97.9 ??F (36.6 ??C)     Resp  18     Ht  5' 8"  (1.727 m)     Wt  67 kg (147 lb 11.3 oz)     SpO2  99%        BMI  22.46 kg/m??           Temp (24hrs),  Avg:98.4 ??F (36.9 ??C), Min:97.7 ??F (36.5 ??C), Max:99.6 ??F (37.6 ??C)         Gen:No distress   HENT: No thrush   Chest: Symmetric expansion, CTA   CV:S1S2 RR, No JVD, No edema   EGB:TDVV, NT, ND, BS+   Skin:No jaundice, cyanosis, clubbing. No decubitus   Muscsk: Normal muscle tone/strength           MEDICATIONS:          Current Facility-Administered Medications          Medication  Dose  Route  Frequency           ?  ketorolac (TORADOL) injection 15 mg   15 mg  IntraVENous  Q6H PRN     ?  dextrose 5 % - 0.45% NaCl infusion   75 mL/hr  IntraVENous  CONTINUOUS     ?  allopurinol (ZYLOPRIM) tablet 100 mg   100 mg  Oral  DAILY     ?  amLODIPine (NORVASC) tablet 5 mg   5 mg  Oral  DAILY     ?  aspirin chewable tablet 81 mg   81 mg  Oral  DAILY     ?  gabapentin (NEURONTIN) capsule 600 mg   600  mg  Oral  BID     ?  metoprolol tartrate (LOPRESSOR) tablet 25 mg   25 mg  Oral  DAILY     ?  tamsulosin (FLOMAX) capsule 0.4 mg   0.4 mg  Oral  DAILY     ?  tiZANidine (ZANAFLEX) tablet 4 mg   4 mg  Oral  QHS     ?  sodium chloride (NS) flush 5-10 mL   5-10 mL  IntraVENous  Q8H     ?  sodium chloride (NS) flush 5-10 mL   5-10 mL  IntraVENous  PRN     ?  naloxone (NARCAN) injection 0.1 mg   0.1 mg  IntraVENous  PRN     ?  acetaminophen (TYLENOL) tablet 650 mg   650 mg  Oral  Q6H PRN     ?  alum-mag hydroxide-simeth (MYLANTA) oral suspension 30 mL   30 mL  Oral  Q4H PRN     ?  albuterol (PROVENTIL VENTOLIN) nebulizer solution 2.5 mg   2.5 mg  Nebulization  Q6H PRN     ?  piperacillin-tazobactam (ZOSYN) 3.375 g in 0.9% sodium chloride (MBP/ADV) 100 mL MBP   3.375 g  IntraVENous  Q8H           ?  enoxaparin (LOVENOX) injection 40 mg   40 mg  SubCUTAneous  Q24H              Labs:  Results:        Chemistry  Recent Labs         01/19/18   0321      GLU  121*      NA  138      K  4.2      CL  108*      CO2  23      BUN  18      CREA  1.5*      CA  8.5      AGAP  6                 CBC w/Diff  Recent Labs         01/19/18   0321      WBC   7.0      RBC  3.36*      HGB  8.5*      HCT  28.4*      PLT  104*      GRANS  68.2*      LYMPH  17.4*      EOS  2.2                      RADIOLOGY :               Imaging     Results from Hospital Encounter encounter on 01/17/18     XR CHEST SNGL V           Narrative  EXAM:  Chest AP      INDICATIONS: sepsis       COMPARISON: None.      FINDINGS:       No consolidation, pleural effusion, or pulmonary edema. No pneumothorax.      Normal cardiomediastinal contours. Median sternotomy wires and changes   post-CABG.              Impression  IMPRESSION:    No acute cardiopulmonary process.  Results from [place] encounter on 01/17/18     CT CHEST WO CONT           Narrative  EXAMINATION: CT CHEST WO CONT      CLINICAL INDICATION: Foreign object assessment      COMPARISON:  None.      TECHNIQUE: All CT exams at this facility use one or more dose reduction   techniques including automatic exposure control, ma/kV  adjustment per patient's   size, or iterative reconstruction technique. DICOM format image data available   to non-affiliated external healthcare facilities or entities on a secure, media   free, reciprocal searchable basis with patient authorization for 12 months   following the date of the study.      Multiple contiguous axial CT images were obtained at 5 mm increments from the   apex of the lungs to the lung bases without contrast      FINDINGS:    Lungs/Soft tissue: Moderate bibasilar pleural thickening and subpleural   atelectasis versus scarring. No intraluminal radiopaque foreign body identified   within the tracheobronchial tree. Sternotomy wires.       Vascular: Unremarkable.      Mediastinum:  Lymph nodes normal size.  No masses.      Thyroid : Unremarkable      Axilla: No adenopathy seen in axilla,        Bones: No significant bony lesions.      Miscellaneous: None      Upper abdomen:  Pneumobilia. Surgical clips gallbladder fossa. Punctate   bilateral renal stones, partially  visualized              Impression  IMPRESSION:       1. No intraluminal radiopaque foreign body identified within the   tracheobronchial tree.    2. Moderate bibasal pleural thickening and atelectasis/scarring   3. Nonobstructive punctate bilateral renal stones   4. Pneumobilia           I have independently examined the patient and reviewed lab studies and imgaing as well as review of nursing notes and physican notes from the past 24 hours.      Dragon medical dictation software was used for portions of this report. Unintended errors may occur.       [name], MD   January 21, 2018      [facility]   [phone]

## 2018-01-21 NOTE — Discharge Summary (Signed)
DISCHARGE SUMMARY     Patient Name: Tommy Brown  Medical Record Number: [id number]  Date of Birth: 1946-01-24  Discharge Provider: Stephanie Coup, MD  Primary Care Provider: None    Admit date: 01/17/2018  Discharge date: 01/21/2018  Discharge Disposition: Home  Code Status: Full Code    Follow-up appointments:  Follow-up Information     Follow up With Specialties Details Why [place] on 01/27/2018 at10:45AM.  Please bring photo ID insurance card and all medications.  Questions please call [phone] [address]  [facility])  [place]  [phone]    None    None (395) Patient stated that they have no PCP            Follow-up recommendations:  TCC 1 week    Discharge diagnosis:  Sepsis likely from intra-abdominal source  Acute kidney injury  Metabolic acidosis  Elevated LFT  Hypertension  Coronary artery disease status post CABG  Pancreatic cancer status post Whipple surgery  BPH  ??  Patient history of pancreatic cancer status post chemotherapy radiation and Whipple procedure presented emergency department with lethargy and fever.  Patient new to the area but states he has had multiple hospitalizations related to "sump"  syndrome related to his Whipple surgery with occasional gram-negative bacteremia necessitating hospital stay and IV antibiotics.  Patient was admitted to the medicine service started on empiric antibiotics infectious disease was consulted.  Patient continued to improve blood cultures and urine cultures ultimately remain negative and MRCP was obtained of his abdomen that was ultimately unrevealing for source of infection.  Patient will be discharged home to complete empiric course of Augmentin, while admitted patient did have send out labs for other etiologies of recurrent fever such as autoimmune disease easily followed up with infectious disease clinic.               Additional diagnoses :   Past Medical History:    Diagnosis Date   ??? Hx of CABG    ??? Kidney stones    ??? Pancreatic cancer [facility])                                                         Discharge Medications:   Current Discharge Medication List      START taking these medications    Details   amoxicillin-clavulanate (AUGMENTIN) 875-125 mg per tablet Take 1 Tab by mouth every twelve (12) hours.  Qty: 7 Tab, Refills: 0         CONTINUE these medications which have NOT CHANGED    Details   metoprolol tartrate (LOPRESSOR) 25 mg tablet Take 25 mg by mouth daily.      gabapentin (NEURONTIN) 600 mg tablet Take 600 mg by mouth two (2) times a day.      amLODIPine (NORVASC) 5 mg tablet Take 5 mg by mouth daily.      allopurinol (ZYLOPRIM) 300 mg tablet Take  by mouth daily.      tamsulosin (FLOMAX) 0.4 mg capsule Take 0.4 mg by mouth daily.      lipase-protease-amylase (ZENPEP) 20,000-63,000- 84,000 unit capsule Take 8 Caps by mouth daily.      tiZANidine (ZANAFLEX) 4 mg tablet Take 4 mg by mouth nightly.  fluticasone propionate 100 mcg/actuation dsdv Take  by inhalation.      aspirin 81 mg chewable tablet Take 81 mg by mouth daily.      TESTOSTERONE IM by IntraMUSCular route.             Discharge Diet:  Diet: Regular Diet    Consultants: ID    Labs:  Labs: Results:       Chemistry Recent Labs     01/19/18  0321   GLU 121*   NA 138   K 4.2   CL 108*   CO2 23   BUN 18   CREA 1.5*   CA 8.5   AGAP 6      CBC w/Diff Recent Labs     01/19/18  0321   WBC 7.0   RBC 3.36*   HGB 8.5*   HCT 28.4*   PLT 104*   GRANS 68.2*   LYMPH 17.4*   EOS 2.2      Cardiac Enzymes No results for input(s): CPK, CKND1, MYO in the last 72 hours.    No lab exists for component: CKRMB, TROIP   Coagulation No results for input(s): PTP, INR, APTT, INREXT in the last 72 hours.    Lipid Panel No results found for: CHOL, CHOLPOCT, CHOLX, CHLST, CHOLV, 884269, HDL, HDLP, LDL, LDLC, DLDLP, 804564, VLDLC, VLDL, TGLX, TRIGL, TRIGP, TGLPOCT, CHHD, CHHDX   BNP No results for input(s): BNPP in the last 72  hours.   Liver Enzymes No results for input(s): TP, ALB, TBIL, AP, SGOT, GPT in the last 72 hours.    No lab exists for component: DBIL   Thyroid Studies No results found for: T4, T3U, TSH, TSHEXT         History of presenting illness:  Patient had pancreatic cancer at the age of 28 years, had Whipple's done in 2006, surgery was done at [facility] in [place], [place] followed this with chemoradiation which she did not finish and could not tolerate and took only half the dose and stopped last time which she received was 7 years ago  He follows with his oncologist once a year who does CT scans and blood work and last CT scan was done in May 2019 which he was reported to be negative  He also follows up with his family doctor Dr.'s Deforest Hoyles, who has researched and spoken with immunologist who he has seen and told that people who have Whipple surgery done they have high likelihood of having recurrent infections in the near the site of surgery in the old pocket and will need to be treated with IV antibiotics all his life every few months  ??        Physical exam: not performed on day of discharge     Discharge condition:  stable    Discharge activity and restrictions: Activity as tolerated    Time spent with discharging patient: [phone] total time spent 35 min      Stephanie Coup, MD  01/21/2018  10:39 AM    Dragon medical dictation software was used for portions of this report.  Unintended voice transcription errors may have occurred.

## 2018-01-22 LAB — PROTEIN ELECTROPHORESIS
ABNORMAL PROTEIN BAND 1: NOT DETECTED
ALPHA-1-GLOBULIN: 0.4 g/dL — ABNORMAL HIGH (ref 0.2–0.3)
ALPHA-2 GLOBULIN: 0.8 g/dL (ref 0.5–0.9)
Abnormal Protein Band 1: NOT DETECTED
Albumin: 3.1 g/dL — ABNORMAL LOW (ref 3.8–4.8)
Albumin: 3.1 g/dL — ABNORMAL LOW (ref 3.8–4.8)
AlpHa-2 Globulin: 0.8 g/dL (ref 0.5–0.9)
Alpha-1-globulin: 0.4 g/dL — ABNORMAL HIGH (ref 0.2–0.3)
BETA 1 GLOBULIN: 0.5 g/dL (ref 0.4–0.6)
BETA 2 GLOBULIN: 0.5 g/dL (ref 0.2–0.5)
Beta 1 Globulin: 0.5 g/dL (ref 0.4–0.6)
Beta 2 Globulin: 0.5 g/dL (ref 0.2–0.5)
GAMMA GLOBULIN: 0.9 g/dL (ref 0.8–1.7)
Gamma globulin: 0.9 g/dL (ref 0.8–1.7)
Protein, total: 6.1 g/dL (ref 6.1–8.1)
Total Protein: 6.1 g/dL (ref 6.1–8.1)

## 2018-01-22 LAB — IMMUNOGLOBULINS, G/A/M, QT.
IGA TOTAL,IGAT: 512 mg/dL — ABNORMAL HIGH (ref 20–320)
IMMUNOGLOBULIN G,IGG: 895 mg/dL (ref 600–1540)
IMMUNOGLOBULIN G: 895 mg/dL (ref 600–1540)
IMMUNOGLOBULIN M,IGM: 176 mg/dL (ref 50–300)
IMMUNOGLOBULIN M: 176 mg/dL (ref 50–300)
IgA TOTAL: 512 mg/dL — ABNORMAL HIGH (ref 20–320)

## 2018-01-23 LAB — CULTURE, BLOOD
Blood Culture Result: NO GROWTH
Blood Culture Result: NO GROWTH

## 2018-01-23 LAB — ANTINUCLEAR ANTIBODIES, IFA
Antinuclear Abs, IFA: NEGATIVE
Antinuclear Abs, IFA: NEGATIVE

## 2018-01-23 LAB — IMMUNOFIXATION

## 2018-01-23 LAB — CULTURE, BLOOD 1
BLOOD CULTURE RESULT: NO GROWTH
BLOOD CULTURE RESULT: NO GROWTH

## 2018-01-24 LAB — C. BURNETII ABS W/RFLX TITERS
C. burnetii, IgG, phase 1: NEGATIVE
Q Fever IgG Phase II Scrn: NEGATIVE
Q Fever IgM Phase I Scrn: NEGATIVE
Q Fever IgM Phase II Scrn: NEGATIVE

## 2018-01-25 LAB — BRUCELLA AB, IGG/IGM
Brucella Ab, IgG: 0.62
Brucella Ab, IgM: 0.05

## 2018-01-27 DIAGNOSIS — C250 Malignant neoplasm of head of pancreas: Secondary | ICD-10-CM | POA: Diagnosis not present

## 2018-01-27 DIAGNOSIS — I10 Essential (primary) hypertension: Secondary | ICD-10-CM | POA: Diagnosis not present

## 2018-01-27 DIAGNOSIS — I2510 Atherosclerotic heart disease of native coronary artery without angina pectoris: Secondary | ICD-10-CM | POA: Diagnosis not present

## 2018-01-27 DIAGNOSIS — A419 Sepsis, unspecified organism: Secondary | ICD-10-CM | POA: Diagnosis not present

## 2018-01-28 DIAGNOSIS — H1013 Acute atopic conjunctivitis, bilateral: Secondary | ICD-10-CM | POA: Diagnosis not present

## 2018-02-12 DIAGNOSIS — H34831 Tributary (branch) retinal vein occlusion, right eye, with macular edema: Secondary | ICD-10-CM | POA: Diagnosis not present

## 2018-02-26 DIAGNOSIS — H34831 Tributary (branch) retinal vein occlusion, right eye, with macular edema: Secondary | ICD-10-CM | POA: Diagnosis not present

## 2018-03-04 DIAGNOSIS — I10 Essential (primary) hypertension: Secondary | ICD-10-CM | POA: Diagnosis not present

## 2018-03-26 DIAGNOSIS — R54 Age-related physical debility: Secondary | ICD-10-CM | POA: Diagnosis not present

## 2018-03-26 DIAGNOSIS — N400 Enlarged prostate without lower urinary tract symptoms: Secondary | ICD-10-CM | POA: Diagnosis not present

## 2018-03-26 DIAGNOSIS — I2510 Atherosclerotic heart disease of native coronary artery without angina pectoris: Secondary | ICD-10-CM | POA: Diagnosis not present

## 2018-03-26 DIAGNOSIS — N189 Chronic kidney disease, unspecified: Secondary | ICD-10-CM | POA: Diagnosis not present

## 2018-03-26 DIAGNOSIS — R011 Cardiac murmur, unspecified: Secondary | ICD-10-CM | POA: Diagnosis not present

## 2018-03-26 DIAGNOSIS — Z760 Encounter for issue of repeat prescription: Secondary | ICD-10-CM | POA: Diagnosis not present

## 2018-03-26 DIAGNOSIS — E8889 Other specified metabolic disorders: Secondary | ICD-10-CM | POA: Diagnosis not present

## 2018-03-26 DIAGNOSIS — Z8507 Personal history of malignant neoplasm of pancreas: Secondary | ICD-10-CM | POA: Diagnosis not present

## 2018-03-26 DIAGNOSIS — E291 Testicular hypofunction: Secondary | ICD-10-CM | POA: Diagnosis not present

## 2018-03-26 DIAGNOSIS — Z90410 Acquired total absence of pancreas: Secondary | ICD-10-CM | POA: Diagnosis not present

## 2018-03-26 DIAGNOSIS — I10 Essential (primary) hypertension: Secondary | ICD-10-CM | POA: Diagnosis not present

## 2018-03-26 DIAGNOSIS — R7989 Other specified abnormal findings of blood chemistry: Secondary | ICD-10-CM | POA: Diagnosis not present

## 2018-03-31 DIAGNOSIS — C259 Malignant neoplasm of pancreas, unspecified: Secondary | ICD-10-CM | POA: Diagnosis not present

## 2018-03-31 DIAGNOSIS — Z8619 Personal history of other infectious and parasitic diseases: Secondary | ICD-10-CM | POA: Diagnosis not present

## 2018-04-09 DIAGNOSIS — H34831 Tributary (branch) retinal vein occlusion, right eye, with macular edema: Secondary | ICD-10-CM | POA: Diagnosis not present

## 2018-04-16 DIAGNOSIS — H34831 Tributary (branch) retinal vein occlusion, right eye, with macular edema: Secondary | ICD-10-CM | POA: Diagnosis not present

## 2018-04-16 DIAGNOSIS — S0501XA Injury of conjunctiva and corneal abrasion without foreign body, right eye, initial encounter: Secondary | ICD-10-CM | POA: Diagnosis not present

## 2018-04-30 ENCOUNTER — Ambulatory Visit: Attending: Urology | Primary: Family Medicine

## 2018-04-30 ENCOUNTER — Ambulatory Visit: Admit: 2018-04-30 | Discharge: 2018-04-30 | Payer: MEDICARE | Attending: Urology | Primary: Family Medicine

## 2018-04-30 ENCOUNTER — Encounter

## 2018-04-30 DIAGNOSIS — R7989 Other specified abnormal findings of blood chemistry: Secondary | ICD-10-CM

## 2018-04-30 DIAGNOSIS — N138 Other obstructive and reflux uropathy: Secondary | ICD-10-CM | POA: Diagnosis not present

## 2018-04-30 DIAGNOSIS — N200 Calculus of kidney: Secondary | ICD-10-CM | POA: Diagnosis not present

## 2018-04-30 DIAGNOSIS — N401 Enlarged prostate with lower urinary tract symptoms: Secondary | ICD-10-CM | POA: Diagnosis not present

## 2018-04-30 LAB — TESTOSTERONE, FREE & TOTAL
% FREE TESTOSTERONE: 1.82 % (ref 1.50–4.20)
% Free testosterone: 1.82 % (ref 1.50–4.20)
TESTOSTERONE, FREE: 4.04 ng/dL — AB (ref 5.00–21.00)
TESTOSTERONE, SERUM (TOTAL) 500912: 222 ng/dL — AB (ref 264–916)
Testosterone, Serum (Total): 222 ng/dL — AB (ref 264–916)
Testosterone, free: 4.04 ng/dL — AB (ref 5.00–21.00)

## 2018-04-30 LAB — AMB POC URINALYSIS DIP STICK AUTO W/O MICRO
Bilirubin (UA POC): NEGATIVE
Bilirubin, Urine, POC: NEGATIVE
Glucose (UA POC): NEGATIVE
Glucose, Urine, POC: NEGATIVE
Leukocyte Esterase, Urine, POC: NEGATIVE
Leukocyte esterase (UA POC): NEGATIVE
Nitrite, Urine, POC: NEGATIVE
Nitrites (UA POC): NEGATIVE
Protein (UA POC): NEGATIVE
Protein, Urine, POC: NEGATIVE
Specific Gravity, Urine, POC: 1.025 NA (ref 1.001–1.035)
Specific gravity (UA POC): 1.025 (ref 1.001–1.035)
Urobilinogen (UA POC): 0.2 (ref 0.2–1)
Urobilinogen, POC: 0.2 (ref 0.2–1)
pH (UA POC): 5.5 (ref 4.6–8.0)
pH, Urine, POC: 5.5 NA (ref 4.6–8.0)

## 2018-04-30 LAB — PSA TOTAL+% FREE
PSA, % FREE, 480762: 48.3 %
PSA, % free: 48.3 %
PSA, Free: 0.29 ng/mL
PSA, Free: 0.29 ng/mL
Pros. Spec. Antigen: 0.6 ng/mL (ref 0.0–4.0)
Prostate Specific Ag: 0.6 ng/mL (ref 0.0–4.0)

## 2018-04-30 LAB — VITAMIN D, 25 HYDROXY: Vit D 25-Hydroxy: 28.1 ng/mL — AB (ref 32.0–100.0)

## 2018-04-30 LAB — FOLLICLE STIMULATING HORMONE
FSH, FSH: 55.8 m[IU]/mL — AB (ref 1.5–12.4)
FSH: 55.8 m[IU]/mL — AB (ref 1.5–12.4)

## 2018-04-30 LAB — URIC ACID
URIC ACID,URA: 3.9 MG/DL (ref 3.9–9.0)
Uric acid: 3.9 MG/DL (ref 3.9–9.0)

## 2018-04-30 LAB — PROLACTIN
PROLACTIN, PROLT: 11.2 ng/mL (ref 4.0–15.2)
Prolactin: 11.2 ng/mL (ref 4.0–15.2)

## 2018-04-30 LAB — ESTROGENS, TOTAL: Estrogens, total: 66 pg/mL (ref 40–115)

## 2018-04-30 LAB — PTH INTACT: PTH INTACT: 30 pg/mL (ref 15–65)

## 2018-04-30 LAB — LUTEINIZING HORMONE
LH,LHU: 68.6 m[IU]/mL — AB (ref 1.7–8.6)
LH,URINE: 68.6 m[IU]/mL — AB (ref 1.7–8.6)

## 2018-04-30 LAB — PTH, INTACT: PTH INTACT,PTHN: 30 pg/mL (ref 15–65)

## 2018-04-30 LAB — VITAMIN D 25 HYDROXY: Vit D, 25-Hydroxy: 28.1 ng/mL — AB (ref 32.0–100.0)

## 2018-04-30 NOTE — Progress Notes (Signed)
Tommy Brown  [birth year]  Encounter Date: 04/30/2018         New Patient      Encounter Diagnoses     ICD-10-CM ICD-9-CM   1. Low testosterone in male R79.89 790.99   2. Nephrolithiasis N20.0 592.0   3. Prostate cancer screening Z12.5 V76.44   4. 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 History of pancreatic cancer Z85.07 V10.09   6. 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 Hypogonadism   No TRT since 10/26/2017   Labs ordered: Free and Total testosterone, Estrogen, Prolactin, FSH, LH,and PSA Free and Total.    Will consider Jatenzo pending labs    2. Nephrolithiasis   Surgeries: multiple procedures, last procedure was right URS/HLL on 03/04/17   Most recent imaging: CT A/P 08/31/17 - Bilateral nephrolithiasis.   Stone analysis: 95% CaOx, 5% CaPh   Medical therapy: none   Metabolic workup: none      UA today 04/30/18 - trace blood, otherwise negative   Collect 24-hour urine    Ordered Vit D, BMP, PTH, and Uric Acid to be completed at time of 24-hour urine     3. Prostate Cancer Screening   Most recent PSA 08/25/13 - 0.5 ng/mL   DRE today 04/30/18 - 15 grams, benign    4. 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 H/o Pancreatic Cancer, s/p Whipple in 2006 and chemotherapy radiation   Recommend genetic testing at [place]. Referral per PCP.     Follow-up and Dispositions    ?? Return in about 3 months (around 07/31/2018) for follow up, review labs.           Chief Complaint   Patient presents with   ??? Hypogonadism     Pt just moved here and got established with a PCP ,Per PCP Pt has to get established with Urology before he (PCP)  will administer Testosterone injections ,no recent labs done        HISTORY OF PRESENT ILLNESS:  Tommy Brown is a 73 y.o. WHITE OR CAUCASIAN male who presents today for consultation and has been referred by Kristine Royal, MD for hypogonadism. He is on testosterone cypionate IM.      Today, patient reports he recently moved to [facility] in 09/2017  Was followed by urologist for hypogonadism. Has been on TRT for several years. Last injection was around 10/26/2017  Since he has not had injection recently, he has symptoms of low testosterone.     Patient denies any urinary symptoms.   No frequency, urgency, or incontinence.  Denies any straining to void.     H/o kidney stones. Passed spontaneously.   Denies any recent UTI's.   No flank or abdominal pain  Denies gross hematuria or dysuria  No f/n/v/c     PMHx:  - H/o pancreatic cancer, s/p Whipple procedure 2006 and chemotherapy radiation     LABS / IMAGING:  Lab Results   Component Value Date/Time    Prostate Specific Ag 0.5 08/25/2013     CT Ab Pelv W Contrast 08/31/17  Kidneys: Unchanged appearance of numerous hypoattenuating lesions of varying sizes and complexity in both kidneys. Some of these lesions demonstrate attenuation higher than fluid density. Bilateral nephrolithiasis.  Ureters: Within normal limits.  Bladder: Within normal limits.  Reproductive system: The prostate measures 4.4 cm in the transverse  diameter. Partially visualized penile prosthesis with reservoir in the anterior pelvis.    AUA Assessment Score:   ;    AUA Bother Rating:    No flowsheet data found.          Past Medical History:   Diagnosis Date   ??? Arthritis    ??? Benign prostate hyperplasia    ??? Bladder infection    ??? Chronic kidney disease    ??? Coronary arteriosclerosis    ??? Coronary atherosclerosis of artery bypass graft    ??? Decreased testosterone level    ??? Diverticular disease    ??? Essential hypertension    ??? Gout    ??? History of acute renal failure    ??? History of anemia    ??? History of malignant neoplasm of pancreas    ??? Hx of CABG    ??? Kidney stone    ??? Kidney stones    ??? Neuropathy    ??? Pancreatic cancer ([place])    ??? Sepsis ([place])    ??? Tremor        Past Surgical History:   Procedure Laterality Date   ??? HX ORTHOPAEDIC      back   ??? HX OTHER SURGICAL  2006     whipple procedure done for pancreatic cancer   ??? HX UROLOGICAL  2012    Percutaneous extraction of a kidney stone w/ fragmentation procedure        Current Outpatient Medications   Medication Sig Dispense Refill   ??? cefUROXime (CEFTIN) 250 mg tablet Take 250 mg by mouth two (2) times a day.     ??? metoprolol tartrate (LOPRESSOR) 25 mg tablet Take 25 mg by mouth daily.     ??? gabapentin (NEURONTIN) 600 mg tablet Take 600 mg by mouth two (2) times a day.     ??? amLODIPine (NORVASC) 5 mg tablet Take 5 mg by mouth daily.     ??? allopurinol (ZYLOPRIM) 300 mg tablet Take  by mouth daily.     ??? tamsulosin (FLOMAX) 0.4 mg capsule Take 0.4 mg by mouth daily.     ??? tiZANidine (ZANAFLEX) 4 mg tablet Take 4 mg by mouth nightly.     ??? fluticasone propionate 100 mcg/actuation dsdv Take  by inhalation.     ??? aspirin 81 mg chewable tablet Take 81 mg by mouth daily.     ??? TESTOSTERONE IM by IntraMUSCular route.     ??? amoxicillin-clavulanate (AUGMENTIN) 875-125 mg per tablet Take 1 Tab by mouth every twelve (12) hours. 7 Tab 0   ??? lipase-protease-amylase (ZENPEP) 20,000-63,000- 84,000 unit capsule Take 8 Caps by mouth daily.         Social History     Tobacco Use   ??? Smoking status: Never Smoker   ??? Smokeless tobacco: Never Used   Substance Use Topics   ??? Alcohol use: Not on file   ??? Drug use: Not Currently       Allergies   Allergen Reactions   ??? Other Food Other (comments)     VISTAID  ANESTHESIA   ??? Codeine Itching and Swelling   ??? Oxycodone Anaphylaxis   ??? Hydroxyzine Pamoate Itching and Other (comments)     Other reaction(s): Unknown     ??? Meperidine Itching, Palpitations, Swelling and Other (comments)     Other reaction(s): Unknown     ??? Metoclopramide Itching, Other (comments) and Swelling     Other reaction(s): Other (See Comments)  Reaction  unknown   Reaction unknown      ??? Morphine Other (comments) and Nausea and Vomiting   ??? Oxycodone-Acetaminophen Itching    ??? Prochlorperazine Itching, Other (comments) and Unknown (comments)     Other reaction(s): Unknown  Unsure of reaction     ??? Hydroxyzine Hcl Hives       Family History   Problem Relation Age of Onset   ??? Alcohol abuse Father    ??? Heart Disease Brother    ??? Emphysema Brother        Review of Systems  Constitutional: Fever: No  Skin: Rash: No  HEENT: Hearing difficulty: No  Eyes: Blurred vision: No  Cardiovascular: Chest pain: No  Respiratory: Shortness of breath: No  Gastrointestinal: Nausea/vomiting: No  Musculoskeletal: Back pain: No  Neurological: Weakness: No  Psychological: Memory loss: No  Comments/additional findings:     Any elements of the PMFSHx, ROS, or preliminary elements of the HPI that were entered by a medical assistant have been reviewed in full.    PHYSICAL EXAMINATION:   Visit Vitals  BP 126/72   Ht 5\' 8"  (1.727 m)   Wt 142 lb (64.4 kg)   BMI 21.59 kg/m??     Constitutional: WDWN, Pleasant and appropriate affect, No acute distress.    CV:  No peripheral swelling noted  Respiratory: No respiratory distress or difficulties  Abdomen:  No abdominal masses or tenderness. No CVA tenderness. No inguinal hernias noted.   GU Male:  04/30/18  [id number] normal to visual inspection, no erythema or irritation, Sphincter with good tone, Rectum with no hemorrhoids, fissures or masses, Prostate smooth, symmetric and anodular. Prostate is approx 15 grams.  SCROTUM:  No scrotal rash or lesions noticed.  Normal bilateral testes and epididymis.   PENIS: Urethral meatus normal in location and size. No urethral discharge. IPP in place.   Skin: No jaundice.    Neuro/Psych:  Alert and oriented x 3, affect appropriate.   Lymphatic:   No enlarged inguinal lymph nodes.      Body mass index is 21.59 kg/m??.     Labs Today:    Results for orders placed or performed in visit on 04/30/18   AMB POC URINALYSIS DIP STICK AUTO W/O MICRO   Result Value Ref Range    Color (UA POC) Yellow     Clarity (UA POC) Clear      Glucose (UA POC) Negative Negative    Bilirubin (UA POC) Negative Negative    Ketones (UA POC) Trace Negative    Specific gravity (UA POC) 1.025 1.001 - 1.035    Blood (UA POC) Trace Negative    pH (UA POC) 5.5 4.6 - 8.0    Protein (UA POC) Negative Negative    Urobilinogen (UA POC) 0.2 mg/dL 0.2 - 1    Nitrites (UA POC) Negative Negative    Leukocyte esterase (UA POC) Negative Negative       A copy of today's office visit with all pertinent imaging results and labs were sent to the referring physician, Kristine Royal, MD.     Retia Passe, MD     Medical Documentation is provided with the assistance of Birdena Jubilee, medical scribe for Retia Passe, MD

## 2018-04-30 NOTE — Progress Notes (Signed)
Progress Notes by Retia Passe, MD at 04/30/18 0830                Author: Retia Passe, MD  Service: --  Author Type: Physician       Filed: 04/30/18 1325  Encounter Date: 04/30/2018  Status: Signed          Editor: Retia Passe, MD (Physician)               Blakely Gluth Brown   1945/09/28   Encounter Date: 04/30/2018               New Patient           Encounter Diagnoses              ICD-10-CM  ICD-9-CM          1.  Low testosterone in male  R79.89  790.99     2.  Nephrolithiasis  N20.0  592.0     3.  Prostate cancer screening  Z12.5  V76.44     4.  BPH with obstruction/lower urinary tract symptoms  N40.1  600.01           N13.8  599.69          5.  History of pancreatic cancer  Z85.07  V10.09          6.  ED (erectile dysfunction) of organic origin  N52.9  607.84            ASSESSMENT & PLAN:     1. Hypogonadism    No TRT since 10/26/2017    Labs ordered: Free and Total testosterone, Estrogen, Prolactin, FSH, LH,and PSA Free and Total.     Will consider Jatenzo pending labs      2. Nephrolithiasis    Surgeries: multiple procedures, last procedure was right URS/HLL on 03/04/17    Most recent imaging: CT A/P 08/31/17 - Bilateral nephrolithiasis.    Stone analysis: 95% CaOx, 5% CaPh    Medical therapy: none    Metabolic workup: none        UA today 04/30/18 - trace blood, otherwise negative    Collect 24-hour urine     Ordered Vit D, BMP, PTH, and Uric Acid to be completed at time of 24-hour urine       3. Prostate Cancer Screening    Most recent PSA 08/25/13 - 0.5 ng/mL    DRE today 04/30/18 - 15 grams, benign      4. 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 H/o Pancreatic Cancer, s/p Whipple in 2006 and chemotherapy radiation    Recommend genetic testing at [place]. Referral per PCP.         Follow-up and Dispositions      ??  Return in about 3 months (around 07/31/2018) for follow up, review labs.                     Chief Complaint       Patient presents with        ?  Hypogonadism             Pt just moved here and  got established with a PCP ,Per PCP Pt has to get established with Urology before he (PCP)  will administer Testosterone injections  ,no recent labs done            HISTORY OF PRESENT ILLNESS:  Tommy Brown  is  a 73 y.o. WHITE OR CAUCASIAN  male who presents today for consultation and has been referred by Kristine Royal,  MD for hypogonadism. He is on testosterone cypionate IM.       Today, patient reports he recently moved to [facility] in 09/2017   Was followed by urologist for hypogonadism. Has been on TRT for several years. Last injection was around 10/26/2017   Since he has not had injection recently, he has symptoms of low testosterone.       Patient denies any urinary symptoms.    No frequency, urgency, or incontinence.   Denies any straining to void.       H/o kidney stones. Passed spontaneously.    Denies any recent UTI's.    No flank or abdominal pain   Denies gross hematuria or dysuria   No f/n/v/c       PMHx:   - H/o pancreatic cancer, s/p Whipple procedure 2006 and chemotherapy radiation       LABS / IMAGING:     Lab Results         Component  Value  Date/Time            Prostate Specific Ag  0.5  08/25/2013        CT Ab Pelv W Contrast 08/31/17   Kidneys: Unchanged appearance of numerous hypoattenuating lesions of varying sizes and complexity in both kidneys. Some of these lesions demonstrate attenuation higher than fluid  density. Bilateral nephrolithiasis.   Ureters: Within normal limits.  Bladder: Within normal limits.  Reproductive system: The prostate measures 4.4 cm in the transverse diameter. Partially visualized penile prosthesis with reservoir  in the anterior pelvis.      AUA Assessment Score:    ;     AUA Bother Rating:     No flowsheet data found.                Past Medical History:        Diagnosis  Date         ?  Arthritis       ?  Benign prostate hyperplasia       ?  Bladder infection       ?  Chronic kidney disease       ?  Coronary arteriosclerosis       ?  Coronary atherosclerosis  of artery bypass graft       ?  Decreased testosterone level       ?  Diverticular disease       ?  Essential hypertension       ?  Gout       ?  History of acute renal failure       ?  History of anemia       ?  History of malignant neoplasm of pancreas       ?  Hx of CABG       ?  Kidney stone       ?  Kidney stones       ?  Neuropathy       ?  Pancreatic cancer ([place])       ?  Sepsis ([place])           ?  Tremor               Past Surgical History:         Procedure  Laterality  Date          ?  HX ORTHOPAEDIC              back          ?  HX OTHER SURGICAL    2006          whipple procedure done for pancreatic cancer          ?  HX UROLOGICAL    2012          Percutaneous extraction of a kidney stone w/ fragmentation procedure              Current Outpatient Medications          Medication  Sig  Dispense  Refill           ?  cefUROXime (CEFTIN) 250 mg tablet  Take 250 mg by mouth two (2) times a day.         ?  metoprolol tartrate (LOPRESSOR) 25 mg tablet  Take 25 mg by mouth daily.         ?  gabapentin (NEURONTIN) 600 mg tablet  Take 600 mg by mouth two (2) times a day.         ?  amLODIPine (NORVASC) 5 mg tablet  Take 5 mg by mouth daily.         ?  allopurinol (ZYLOPRIM) 300 mg tablet  Take  by mouth daily.         ?  tamsulosin (FLOMAX) 0.4 mg capsule  Take 0.4 mg by mouth daily.         ?  tiZANidine (ZANAFLEX) 4 mg tablet  Take 4 mg by mouth nightly.         ?  fluticasone propionate 100 mcg/actuation dsdv  Take  by inhalation.         ?  aspirin 81 mg chewable tablet  Take 81 mg by mouth daily.         ?  TESTOSTERONE IM  by IntraMUSCular route.         ?  amoxicillin-clavulanate (AUGMENTIN) 875-125 mg per tablet  Take 1 Tab by mouth every twelve (12) hours.  7 Tab  0           ?  lipase-protease-amylase (ZENPEP) 20,000-63,000- 84,000 unit capsule  Take 8 Caps by mouth daily.                 Social History          Tobacco Use         ?  Smoking status:  Never Smoker     ?  Smokeless tobacco:  Never Used        Substance Use Topics         ?  Alcohol use:  Not on file         ?  Drug use:  Not Currently             Allergies        Allergen  Reactions         ?  Other Food  Other (comments)             VISTAID   ANESTHESIA         ?  Codeine  Itching and Swelling     ?  Oxycodone  Anaphylaxis     ?  Hydroxyzine Pamoate  Itching and Other (comments)             Other reaction(s): Unknown            ?  Meperidine  Itching, Palpitations, Swelling and Other (comments)             Other reaction(s): Unknown            ?  Metoclopramide  Itching, Other (comments) and Swelling             Other reaction(s): Other (See Comments)   Reaction unknown    Reaction unknown             ?  Morphine  Other (comments) and Nausea and Vomiting     ?  Oxycodone-Acetaminophen  Itching     ?  Prochlorperazine  Itching, Other (comments) and Unknown (comments)             Other reaction(s): Unknown   Unsure of reaction            ?  Hydroxyzine Hcl  Hives             Family History         Problem  Relation  Age of Onset          ?  Alcohol abuse  Father       ?  Heart Disease  Brother            ?  Emphysema  Brother            Review of Systems   Constitutional: Fever: No   Skin: Rash: No   HEENT: Hearing difficulty: No   Eyes: Blurred vision: No   Cardiovascular: Chest pain: No   Respiratory: Shortness of breath: No   Gastrointestinal: Nausea/vomiting: No   Musculoskeletal: Back pain: No   Neurological: Weakness: No   Psychological: Memory loss: No   Comments/additional findings:       Any elements of the PMFSHx, ROS, or preliminary elements of the HPI that were entered by a medical assistant have been reviewed in full.      PHYSICAL EXAMINATION:    Visit Vitals      BP  126/72     Ht  5\' 8"  (1.727 m)     Wt  142 lb (64.4 kg)        BMI  21.59 kg/m??        Constitutional: WDWN, Pleasant and appropriate affect, No acute distress.     CV:  No peripheral swelling noted   Respiratory: No respiratory distress or difficulties   Abdomen:  No  abdominal masses or tenderness. No CVA tenderness. No inguinal hernias noted.    GU Male:  04/30/18   [id number] normal to visual inspection, no erythema or irritation, Sphincter with good tone, Rectum with no hemorrhoids,  fissures or masses, Prostate smooth, symmetric and anodular. Prostate is approx 15 grams.   SCROTUM:  No scrotal rash or lesions noticed.  Normal bilateral testes and epididymis.    PENIS: Urethral meatus normal in location and size. No urethral discharge. IPP in place.    Skin: No jaundice.     Neuro/Psych:  Alert and oriented x 3, affect appropriate.    Lymphatic:   No enlarged inguinal lymph nodes.        Body mass index is 21.59 kg/m??.       Labs Today:       Results for orders placed or performed in visit on 04/30/18     AMB POC URINALYSIS DIP STICK AUTO W/O MICRO         Result  Value  Ref Range  Color (UA POC)  Yellow         Clarity (UA POC)  Clear         Glucose (UA POC)  Negative  Negative       Bilirubin (UA POC)  Negative  Negative       Ketones (UA POC)  Trace  Negative       Specific gravity (UA POC)  1.025  1.001 - 1.035       Blood (UA POC)  Trace  Negative       pH (UA POC)  5.5  4.6 - 8.0       Protein (UA POC)  Negative  Negative       Urobilinogen (UA POC)  0.2 mg/dL  0.2 - 1       Nitrites (UA POC)  Negative  Negative            Leukocyte esterase (UA POC)  Negative  Negative           A copy of today's office visit with all pertinent imaging results and labs were sent to the referring physician, Kristine Royal, MD.       Retia Passe, MD       Medical Documentation is provided with the assistance of Birdena Jubilee, medical scribe for Retia Passe, MD

## 2018-05-06 NOTE — Telephone Encounter (Signed)
Attempted to call the pt, No answer . Per Dr Donovan Kail he will like the patient to have the [facility] delay till  06/26/2018. AT that time he will received the materials for the test . Order already faxed to [facility] on 03.05.2020  Braxton Feathers

## 2018-05-06 NOTE — Telephone Encounter (Signed)
Pt called to inquire about 24 hour litholink kit He did not receive it. I verified mailing address on file. Message sent to Wanda Plump to please call pt at [phone]

## 2018-05-07 DIAGNOSIS — S0501XD Injury of conjunctiva and corneal abrasion without foreign body, right eye, subsequent encounter: Secondary | ICD-10-CM | POA: Diagnosis not present

## 2018-05-07 DIAGNOSIS — H34831 Tributary (branch) retinal vein occlusion, right eye, with macular edema: Secondary | ICD-10-CM | POA: Diagnosis not present

## 2018-05-07 DIAGNOSIS — Z961 Presence of intraocular lens: Secondary | ICD-10-CM | POA: Diagnosis not present

## 2018-05-07 DIAGNOSIS — H2511 Age-related nuclear cataract, right eye: Secondary | ICD-10-CM | POA: Diagnosis not present

## 2018-05-11 NOTE — Telephone Encounter (Signed)
Pts wife called w/ pt in the room inquiring about the Litholink order per Dr. Donovan Kail. I read her the message to where Dr. Donovan Kail wanted pt to hold off until 06/26/2018 to do Litholink. She thanked me and call was ended.  ~TRoth

## 2018-05-12 NOTE — Telephone Encounter (Signed)
Noted  

## 2018-05-19 ENCOUNTER — Inpatient Hospital Stay: Admit: 2018-05-19 | Payer: MEDICARE | Primary: Family Medicine

## 2018-05-19 ENCOUNTER — Encounter

## 2018-05-19 DIAGNOSIS — I10 Essential (primary) hypertension: Secondary | ICD-10-CM

## 2018-05-19 DIAGNOSIS — Z6822 Body mass index (BMI) 22.0-22.9, adult: Secondary | ICD-10-CM | POA: Diagnosis not present

## 2018-05-19 DIAGNOSIS — N200 Calculus of kidney: Secondary | ICD-10-CM | POA: Diagnosis not present

## 2018-05-19 DIAGNOSIS — Z8507 Personal history of malignant neoplasm of pancreas: Secondary | ICD-10-CM | POA: Diagnosis not present

## 2018-05-19 DIAGNOSIS — R1084 Generalized abdominal pain: Secondary | ICD-10-CM | POA: Diagnosis not present

## 2018-05-19 DIAGNOSIS — K5730 Diverticulosis of large intestine without perforation or abscess without bleeding: Secondary | ICD-10-CM | POA: Diagnosis not present

## 2018-05-19 DIAGNOSIS — R110 Nausea: Secondary | ICD-10-CM | POA: Diagnosis not present

## 2018-05-19 DIAGNOSIS — R509 Fever, unspecified: Secondary | ICD-10-CM | POA: Diagnosis not present

## 2018-05-21 ENCOUNTER — Inpatient Hospital Stay: Admit: 2018-05-21 | Payer: MEDICARE | Attending: Family Medicine | Primary: Family Medicine

## 2018-05-21 DIAGNOSIS — K5790 Diverticulosis of intestine, part unspecified, without perforation or abscess without bleeding: Secondary | ICD-10-CM

## 2018-05-21 DIAGNOSIS — K769 Liver disease, unspecified: Secondary | ICD-10-CM | POA: Diagnosis not present

## 2018-05-21 DIAGNOSIS — C259 Malignant neoplasm of pancreas, unspecified: Secondary | ICD-10-CM | POA: Diagnosis not present

## 2018-05-21 DIAGNOSIS — K5730 Diverticulosis of large intestine without perforation or abscess without bleeding: Secondary | ICD-10-CM | POA: Diagnosis not present

## 2018-05-21 DIAGNOSIS — N400 Enlarged prostate without lower urinary tract symptoms: Secondary | ICD-10-CM | POA: Diagnosis not present

## 2018-05-21 DIAGNOSIS — N200 Calculus of kidney: Secondary | ICD-10-CM | POA: Diagnosis not present

## 2018-05-21 DIAGNOSIS — R1084 Generalized abdominal pain: Secondary | ICD-10-CM | POA: Diagnosis not present

## 2018-05-21 DIAGNOSIS — Z90411 Acquired partial absence of pancreas: Secondary | ICD-10-CM | POA: Diagnosis not present

## 2018-05-21 DIAGNOSIS — I10 Essential (primary) hypertension: Secondary | ICD-10-CM | POA: Diagnosis not present

## 2018-05-21 DIAGNOSIS — Z8507 Personal history of malignant neoplasm of pancreas: Secondary | ICD-10-CM | POA: Diagnosis not present

## 2018-05-21 DIAGNOSIS — K449 Diaphragmatic hernia without obstruction or gangrene: Secondary | ICD-10-CM | POA: Diagnosis not present

## 2018-05-21 DIAGNOSIS — Z9049 Acquired absence of other specified parts of digestive tract: Secondary | ICD-10-CM | POA: Diagnosis not present

## 2018-05-21 LAB — CREATININE, POC: Creatinine: 1.1 mg/dl (ref 0.6–1.3)

## 2018-05-21 LAB — AMB POC CREATININE: Creatinine: 1.1 mg/dl (ref 0.6–1.3)

## 2018-05-21 MED ORDER — IOPAMIDOL 61 % IV SOLN
61 % | Freq: Once | INTRAVENOUS | Status: AC
Start: 2018-05-21 — End: 2018-05-21
  Administered 2018-05-21: 14:00:00 via INTRAVENOUS

## 2018-05-21 MED FILL — ISOVUE-300  61 % INTRAVENOUS SOLUTION: 300 mg iodine /mL (61 %) | INTRAVENOUS | Qty: 80

## 2018-06-04 ENCOUNTER — Ambulatory Visit: Attending: Family | Primary: Family Medicine

## 2018-06-04 ENCOUNTER — Encounter: Payer: MEDICARE | Attending: Urology | Primary: Family Medicine

## 2018-06-04 ENCOUNTER — Ambulatory Visit: Admit: 2018-06-04 | Discharge: 2018-06-04 | Payer: MEDICARE | Attending: Family | Primary: Family Medicine

## 2018-06-04 DIAGNOSIS — R7989 Other specified abnormal findings of blood chemistry: Secondary | ICD-10-CM

## 2018-06-04 MED ORDER — KETOROLAC TROMETHAMINE 10 MG TAB
10 mg | ORAL_TABLET | Freq: Four times a day (QID) | ORAL | 5 refills | Status: DC | PRN
Start: 2018-06-04 — End: 2018-06-26

## 2018-06-04 NOTE — Progress Notes (Signed)
Tommy Brown  Jan 15, 1946  Encounter Date: 06/04/2018         Telehealth Visit      Encounter Diagnoses     ICD-10-CM ICD-9-CM   1. Low testosterone in male R79.89 790.99   2. Nephrolithiasis N20.0 592.0   3. Prostate cancer screening Z12.5 V76.44   4. 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 Hypogonadism   -No TRT since 10/26/2017   -Labs ordered: Free and Total testosterone, Estrogen, Prolactin, FSH, LH,and PSA Free and Total.    -Will consider Jatenzo pending labs   -Most recent T 222 from 04/30/2018    2. Nephrolithiasis   -Surgeries: multiple procedures, last procedure was right URS/HLL on 03/04/17   -Most recent imaging: CT A/P 08/31/17 - Bilateral nephrolithiasis.   -Stone analysis: 95% CaOx, 5% CaPh   -Medical therapy: none   -Metabolic workup: none    -CT A/P 05/21/2018 shows:  Bilateral nonobstructing nephrolithiasis, largest 8 mm left renal stone, 1.2 x 1.1 cm right hepatic lobe lesion, subcentimeter renal and hepatic hypodensities too small to characterize, too numerous to characterize bilateral renal hypodensities, some demonstrate proteinaceous or hemorrhagic cysts    3. Prostate Cancer Screening   -Most recent PSA 08/25/13 - 0.5 ng/mL   -DRE today 04/30/18 - 15 grams, benign   -Most recent PSA of 0.6ng/mL from 04/30/2018    4. Erectile Dysfunction   -PP in place    5. H/o Pancreatic Cancer   -s/p Whipple in 2006 and chemotherapy radiation   -Recommend genetic testing at [facility]. Referral per PCP.     PLAN:  ?? Continue Toradol prn - refill sent   ?? Will do Litholink after stone passes for metabolic workup due to history of stones  ?? Discussed treatment options for stone. Would like to do AS at this time.  ?? Advised pt if sudden fever, inability to tolerate po, or unrelenting pain occurs to go to ER  ?? Continue low oxalate diet and increase water intake  ?? FU in 2 weeks for acute care appt with KUB     All questions answered       Chief Complaint   Patient presents with   ??? Flank Pain     has a history of kidney stones. currently with intermittent left flank pain. Denies gross hematuria   ??? Hypogonadism     patient would like to discuss possibly restarting testosterone supplement.       HISTORY OF PRESENT ILLNESS:  Tommy Brown is a 73 y.o. WHITE OR CAUCASIAN male who presents today for consultation and has been referred by Daralene Milch, MD for hypogonadism. He is on testosterone cypionate IM.     Presents today for a kidney stone. Recent CT shows 40mm stone in left kidney. Patient reports that he was previously experiencing pain, but today, the pain is resolved. Continues to take Flomax, but has not observed a passed stone. He admits that he has not been straining urine. Denies gross hematuria, dysuria, flank pain, and no f/c/n/v.     Prior GU History:  Today, patient reports he recently moved to [facility] in 09/2017  Was followed by urologist for hypogonadism. Has been on TRT for several years. Last injection was around 10/26/2017  Since he has not had injection recently, he has symptoms of low testosterone.     Patient denies any  urinary symptoms.   No frequency, urgency, or incontinence.  Denies any straining to void.     H/o kidney stones. Passed spontaneously.   Denies any recent UTI's.   No flank or abdominal pain  Denies gross hematuria or dysuria  No f/n/v/c     PMHx:  - H/o pancreatic cancer, s/p Whipple procedure 2006 and chemotherapy radiation     LABS / IMAGING:  Lab Results   Component Value Date/Time    Prostate Specific Ag 0.6 04/30/2018    Prostate Specific Ag 0.5 08/25/2013     CT A/P 05/21/2018     IMPRESSION:  1.  Bilateral nonobstructing nephrolithiasis, largest 8 mm left renal stone    2.  A 1.2 x 1.1 cm right hepatic lobe lesion     3.  Subcentimeter renal and hepatic hypodensities too small to characterize too numerous to characterize bilateral renal hypodensities, some demonstrate proteinaceous or hemorrhagic cysts      CT Ab Pelv W Contrast 08/31/17  Kidneys: Unchanged appearance of numerous hypoattenuating lesions of varying sizes and complexity in both kidneys. Some of these lesions demonstrate attenuation higher than fluid density. Bilateral nephrolithiasis.  Ureters: Within normal limits.  Bladder: Within normal limits.  Reproductive system: The prostate measures 4.4 cm in the transverse diameter. Partially visualized penile prosthesis with reservoir in the anterior pelvis.    AUA Assessment Score:   ;    AUA Bother Rating:    No flowsheet data found.          Past Medical History:   Diagnosis Date   ??? Arthritis    ??? Benign prostate hyperplasia    ??? Bladder infection    ??? Chronic kidney disease    ??? Coronary arteriosclerosis    ??? Coronary atherosclerosis of artery bypass graft    ??? Decreased testosterone level    ??? Diverticular disease    ??? Essential hypertension    ??? Gout    ??? History of acute renal failure    ??? History of anemia    ??? History of malignant neoplasm of pancreas    ??? Hx of CABG    ??? Kidney stone    ??? Kidney stones    ??? Neuropathy    ??? Pancreatic cancer ([place])    ??? Sepsis ([place])    ??? Tremor        Past Surgical History:   Procedure Laterality Date   ??? HX ORTHOPAEDIC      back   ??? HX OTHER SURGICAL  2006    whipple procedure done for pancreatic cancer   ??? HX UROLOGICAL  2012    Percutaneous extraction of a kidney stone w/ fragmentation procedure        Current Outpatient Medications   Medication Sig Dispense Refill   ??? metoprolol tartrate (LOPRESSOR) 25 mg tablet Take 25 mg by mouth daily.     ??? gabapentin (NEURONTIN) 600 mg tablet Take 600 mg by mouth two (2) times a day.     ??? amLODIPine (NORVASC) 5 mg tablet Take 5 mg by mouth daily.     ??? allopurinol (ZYLOPRIM) 300 mg tablet Take  by mouth daily.      ??? tamsulosin (FLOMAX) 0.4 mg capsule Take 0.4 mg by mouth daily.     ??? lipase-protease-amylase (ZENPEP) 20,000-63,000- 84,000 unit capsule Take 8 Caps by mouth daily.     ??? tiZANidine (ZANAFLEX) 4 mg tablet Take 4 mg by mouth nightly.     ???  fluticasone propionate 100 mcg/actuation dsdv Take  by inhalation.     ??? aspirin 81 mg chewable tablet Take 81 mg by mouth daily.     ??? TESTOSTERONE IM by IntraMUSCular route.         Social History     Tobacco Use   ??? Smoking status: Never Smoker   ??? Smokeless tobacco: Never Used   Substance Use Topics   ??? Alcohol use: Not Currently   ??? Drug use: Not Currently       Allergies   Allergen Reactions   ??? Other Food Other (comments)     VISTAID  ANESTHESIA   ??? Codeine Itching and Swelling   ??? Oxycodone Anaphylaxis   ??? Hydroxyzine Pamoate Itching and Other (comments)     Other reaction(s): Unknown     ??? Meperidine Itching, Palpitations, Swelling and Other (comments)     Other reaction(s): Unknown     ??? Metoclopramide Itching, Other (comments) and Swelling     Other reaction(s): Other (See Comments)  Reaction unknown   Reaction unknown      ??? Morphine Other (comments) and Nausea and Vomiting   ??? Oxycodone-Acetaminophen Itching   ??? Prochlorperazine Itching, Other (comments) and Unknown (comments)     Other reaction(s): Unknown  Unsure of reaction     ??? Hydroxyzine Hcl Hives       Family History   Problem Relation Age of Onset   ??? Alcohol abuse Father    ??? Heart Disease Brother    ??? Emphysema Brother        Review of Systems  Constitutional: Fever: No  Skin: Rash: No  HEENT: Hearing difficulty: No  Eyes: Blurred vision: No  Cardiovascular: Chest pain: No  Respiratory: Shortness of breath: No  Gastrointestinal: Nausea/vomiting: No  Musculoskeletal: Back pain: No  Neurological: Weakness:   Psychological: Memory loss: No  Comments/additional findings:     Any elements of the PMFSHx, ROS, or preliminary elements of the HPI that were entered by a medical assistant have been reviewed in full.     PHYSICAL EXAMINATION:   Visit Vitals  Ht 5\' 8"  (1.727 m)   Wt 140 lb (63.5 kg)   BMI 21.29 kg/m??     Constitutional: WDWN, Pleasant and appropriate affect, No acute distress.    CV:  No peripheral swelling noted  Respiratory: No respiratory distress or difficulties  Abdomen:  No abdominal masses or tenderness. No CVA tenderness. No inguinal hernias noted.   GU Male:  04/30/18  [id number] normal to visual inspection, no erythema or irritation, Sphincter with good tone, Rectum with no hemorrhoids, fissures or masses, Prostate smooth, symmetric and anodular. Prostate is approx 15 grams.  SCROTUM:  No scrotal rash or lesions noticed.  Normal bilateral testes and epididymis.   PENIS: Urethral meatus normal in location and size. No urethral discharge. IPP in place.   Skin: No jaundice.    Neuro/Psych:  Alert and oriented x 3, affect appropriate.   Lymphatic:   No enlarged inguinal lymph nodes.      Body mass index is 21.29 kg/m??.     Labs Today:    No results found for any visits on 06/04/18.    Sharon Mt. Kary Kos, [place]  [facility] [place] [address]   [phone]    Laurence Ferrari, NP    This visit was conducted as a synchronous telemedicine service rendered via real-time interactive audio and video telecommunications  system. On May 06, 2018, the [place] declared the COVID-19 (Novel Coronavirus) viral disease to be a pandemic. As a result of this emergency, a rapidly evolving situation, practice patterns for physicians, physician assistants, and nurse practitioners are shifting to accommodate the need to treat in conjunction with unprecedented guidance from federal, state, and local authorities??? which include, but are not limited to, self-quarantines and/or limiting physical proximity to others under any number of circumstances.   It is within this context (and with the understanding that this method of patient encounter is in the patient???s best interest as well as the health and safety of other patients and the public) that ???telehealth??? is being provided for this patient encounter rather than a face-to-face visit. This patient encounter is appropriate and reasonable under the circumstances given the patient???s particular presentation at this time. The patient has been advised of the potential risks and limitations of this mode of treatment (including, but not limited to, the absence of in-person examination) and has agreed to be treated in a remote fashion in spite of them. Any and all of the patient???s/patient???s family???s questions on this issue have been answered, and I have made no promises or guarantees to the patient. The patient has also been advised to contact this office for worsening conditions or problems, and seek emergency medical treatment and/or call 911 if the patient deems either necessary.

## 2018-06-04 NOTE — Progress Notes (Signed)
Patient had a telemed appointment for today with chris henrick, called patient twice and left messages both times to let him know he needed to register to be able to talk with him. Patient never called back

## 2018-06-04 NOTE — Progress Notes (Signed)
Progress Notes by Laurence Ferrari, NP at 06/04/18 1000                Author: Laurence Ferrari, NP  Service: --  Author Type: Nurse Practitioner       Filed: 06/05/18 1058  Encounter Date: 06/04/2018  Status: Signed          Editor: Laurence Ferrari, NP (Nurse Practitioner)               Tommy Brown   06/29/1945   Encounter Date: 06/04/2018               Telehealth Visit           Encounter Diagnoses              ICD-10-CM  ICD-9-CM          1.  Low testosterone in male  R79.89  790.99     2.  Nephrolithiasis  N20.0  592.0     3.  Prostate cancer screening  Z12.5  V76.44     4.  BPH with obstruction/lower urinary tract symptoms  N40.1  600.01           N13.8  599.69            ASSESSMENT & PLAN:        1. Hypogonadism    -No TRT since 10/26/2017    -Labs ordered: Free and Total testosterone, Estrogen, Prolactin, FSH, LH,and PSA Free and Total.     -Will consider Jatenzo pending labs    -Most recent T 222 from 04/30/2018      2. Nephrolithiasis    -Surgeries: multiple procedures, last procedure was right URS/HLL on 03/04/17    -Most recent imaging: CT A/P 08/31/17 - Bilateral nephrolithiasis.    -Stone analysis: 95% CaOx, 5% CaPh    -Medical therapy: none    -Metabolic workup: none     -CT A/P 05/21/2018 shows:  Bilateral nonobstructing nephrolithiasis, largest 8 mm left renal stone, 1.2 x 1.1 cm right hepatic lobe lesion, subcentimeter renal and hepatic  hypodensities too small to characterize, too numerous to characterize bilateral renal hypodensities, some demonstrate proteinaceous or hemorrhagic cysts      3. Prostate Cancer Screening    -Most recent PSA 08/25/13 - 0.5 ng/mL    -DRE today 04/30/18 - 75 grams, benign    -Most recent PSA of 0.6ng/mL from 04/30/2018      4. Erectile Dysfunction    -PP in place      5. H/o Pancreatic Cancer    -s/p Whipple in 2006 and chemotherapy radiation    -Recommend genetic testing at [facility]. Referral per PCP.       PLAN:   ??  Continue Toradol prn -  refill sent    ??  Will do Litholink after stone passes for metabolic workup due to history of stones   ??  Discussed treatment options for stone. Would like to do AS at this time.   ??  Advised pt if sudden fever, inability to tolerate po, or unrelenting pain occurs to go to ER   ??  Continue low oxalate diet and increase water intake   ??  FU in 2 weeks for acute care appt with KUB       All questions answered           Chief Complaint       Patient presents with        ?  Flank Pain             has a history of kidney stones. currently with intermittent left flank pain. Denies gross hematuria        ?  Hypogonadism             patient would like to discuss possibly restarting testosterone supplement.           HISTORY OF PRESENT ILLNESS:  Tommy Brown  is a 73 y.o. WHITE OR CAUCASIAN  male who presents today for consultation and has been referred by Daralene Milch, MD for hypogonadism. He is on testosterone cypionate IM.       Presents today for a kidney stone. Recent CT shows 84mm stone in left kidney. Patient reports that he was previously experiencing pain, but today, the pain is resolved. Continues to take Flomax, but has not observed a passed stone. He admits that he has  not been straining urine. Denies gross hematuria, dysuria, flank pain, and no f/c/n/v.       Prior GU History:   Today, patient reports he recently moved to [facility] in 09/2017   Was followed by urologist for hypogonadism. Has been on TRT for several years. Last injection was around 10/26/2017   Since he has not had injection recently, he has symptoms of low testosterone.       Patient denies any urinary symptoms.    No frequency, urgency, or incontinence.   Denies any straining to void.       H/o kidney stones. Passed spontaneously.    Denies any recent UTI's.    No flank or abdominal pain   Denies gross hematuria or dysuria   No f/n/v/c       PMHx:   - H/o pancreatic cancer, s/p Whipple procedure 2006 and chemotherapy radiation        LABS / IMAGING:     Lab Results         Component  Value  Date/Time            Prostate Specific Ag  0.6  04/30/2018            Prostate Specific Ag  0.5  08/25/2013        CT A/P 05/21/2018      IMPRESSION:   1.  Bilateral nonobstructing nephrolithiasis, largest 8 mm left renal stone      2.  A 1.2 x 1.1 cm right hepatic lobe lesion      3.  Subcentimeter renal and hepatic hypodensities too small to characterize too numerous to characterize bilateral renal hypodensities, some demonstrate proteinaceous or hemorrhagic cysts         CT Ab Pelv W Contrast 08/31/17   Kidneys: Unchanged appearance of numerous hypoattenuating lesions of varying sizes and complexity in both kidneys. Some of these lesions demonstrate attenuation higher than fluid  density. Bilateral nephrolithiasis.   Ureters: Within normal limits.  Bladder: Within normal limits.  Reproductive system: The prostate measures 4.4 cm in the transverse diameter. Partially visualized penile prosthesis with reservoir  in the anterior pelvis.      AUA Assessment Score:    ;     AUA Bother Rating:     No flowsheet data found.                Past Medical History:        Diagnosis  Date         ?  Arthritis       ?  Benign prostate hyperplasia       ?  Bladder infection       ?  Chronic kidney disease       ?  Coronary arteriosclerosis       ?  Coronary atherosclerosis of artery bypass graft       ?  Decreased testosterone level       ?  Diverticular disease       ?  Essential hypertension       ?  Gout       ?  History of acute renal failure       ?  History of anemia       ?  History of malignant neoplasm of pancreas       ?  Hx of CABG       ?  Kidney stone       ?  Kidney stones       ?  Neuropathy       ?  Pancreatic cancer ([place])       ?  Sepsis ([place])           ?  Tremor               Past Surgical History:         Procedure  Laterality  Date          ?  HX ORTHOPAEDIC              back          ?  HX OTHER SURGICAL    2006          whipple procedure done for  pancreatic cancer          ?  HX UROLOGICAL    2012          Percutaneous extraction of a kidney stone w/ fragmentation procedure              Current Outpatient Medications          Medication  Sig  Dispense  Refill           ?  metoprolol tartrate (LOPRESSOR) 25 mg tablet  Take 25 mg by mouth daily.         ?  gabapentin (NEURONTIN) 600 mg tablet  Take 600 mg by mouth two (2) times a day.         ?  amLODIPine (NORVASC) 5 mg tablet  Take 5 mg by mouth daily.         ?  allopurinol (ZYLOPRIM) 300 mg tablet  Take  by mouth daily.         ?  tamsulosin (FLOMAX) 0.4 mg capsule  Take 0.4 mg by mouth daily.         ?  lipase-protease-amylase (ZENPEP) 20,000-63,000- 84,000 unit capsule  Take 8 Caps by mouth daily.         ?  tiZANidine (ZANAFLEX) 4 mg tablet  Take 4 mg by mouth nightly.         ?  fluticasone propionate 100 mcg/actuation dsdv  Take  by inhalation.         ?  aspirin 81 mg chewable tablet  Take 81 mg by mouth daily.               ?  TESTOSTERONE IM  by IntraMUSCular route.                 Social  History          Tobacco Use         ?  Smoking status:  Never Smoker     ?  Smokeless tobacco:  Never Used       Substance Use Topics         ?  Alcohol use:  Not Currently         ?  Drug use:  Not Currently             Allergies        Allergen  Reactions         ?  Other Food  Other (comments)             VISTAID   ANESTHESIA         ?  Codeine  Itching and Swelling     ?  Oxycodone  Anaphylaxis     ?  Hydroxyzine Pamoate  Itching and Other (comments)             Other reaction(s): Unknown            ?  Meperidine  Itching, Palpitations, Swelling and Other (comments)             Other reaction(s): Unknown            ?  Metoclopramide  Itching, Other (comments) and Swelling             Other reaction(s): Other (See Comments)   Reaction unknown    Reaction unknown             ?  Morphine  Other (comments) and Nausea and Vomiting     ?  Oxycodone-Acetaminophen  Itching     ?  Prochlorperazine  Itching, Other  (comments) and Unknown (comments)             Other reaction(s): Unknown   Unsure of reaction            ?  Hydroxyzine Hcl  Hives             Family History         Problem  Relation  Age of Onset          ?  Alcohol abuse  Father       ?  Heart Disease  Brother            ?  Emphysema  Brother            Review of Systems   Constitutional: Fever: No   Skin: Rash: No   HEENT: Hearing difficulty: No   Eyes: Blurred vision: No   Cardiovascular: Chest pain: No   Respiratory: Shortness of breath: No   Gastrointestinal: Nausea/vomiting: No   Musculoskeletal: Back pain: No   Neurological: Weakness:    Psychological: Memory loss: No   Comments/additional findings:       Any elements of the PMFSHx, ROS, or preliminary elements of the HPI that were entered by a medical assistant have been reviewed in full.      PHYSICAL EXAMINATION:    Visit Vitals      Ht  5\' 8"  (1.727 m)     Wt  140 lb (63.5 kg)        BMI  21.29 kg/m??        Constitutional: WDWN, Pleasant and appropriate affect, No acute distress.     CV:  No peripheral swelling noted   Respiratory: No respiratory  distress or difficulties   Abdomen:  No abdominal masses or tenderness. No CVA tenderness. No inguinal hernias noted.    GU Male:  04/30/18   [id number] normal to visual inspection, no erythema or irritation, Sphincter with good tone, Rectum with no hemorrhoids,  fissures or masses, Prostate smooth, symmetric and anodular. Prostate is approx 15 grams.   SCROTUM:  No scrotal rash or lesions noticed.  Normal bilateral testes and epididymis.    PENIS: Urethral meatus normal in location and size. No urethral discharge. IPP in place.    Skin: No jaundice.     Neuro/Psych:  Alert and oriented x 3, affect appropriate.    Lymphatic:   No enlarged inguinal lymph nodes.        Body mass index is 21.29 kg/m??.       Labs Today:     No results found for any visits on 06/04/18.      Sharon Mt. Kary Kos, [place]   [facility] [place] [address]    [phone]      Laurence Ferrari, NP      This visit was conducted as a synchronous telemedicine service rendered via real-time interactive audio and video telecommunications system. On May 06, 2018, the [place] declared the COVID-19 (Novel Coronavirus) viral disease to be a pandemic. As a result of this emergency, a rapidly evolving situation, practice patterns for physicians, physician assistants, and nurse practitioners are shifting to accommodate  the need to treat in conjunction with unprecedented guidance from federal, state, and local authorities-- which include, but are not limited to, self-quarantines and/or limiting physical proximity to others under any number of circumstances.   It is within this context (and with the understanding that this method of patient encounter is in the patients best interest as well as the health and safety of other  patients and the public) that telehealth is being provided for this patient encounter rather than a face-to-face visit. This patient encounter is appropriate and reasonable under the circumstances given the patients particular  presentation at this time. The patient has been advised of the potential risks and limitations of this mode of treatment (including, but not limited to, the absence of in-person examination) and has agreed to be treated in a remote fashion in spite of  them. Any and all of the patients/patients familys questions on this issue have been answered, and I have made no promises or guarantees to the patient. The patient has also been advised to contact this office for worsening conditions  or problems, and seek emergency medical treatment and/or call 911 if the patient deems either necessary.

## 2018-06-08 NOTE — Telephone Encounter (Signed)
Pt had Telehealth appt on Thursday with Quillian Quince and was given Tordol for pain. Pt states he doesn't want to go to hospital for his pain he is having now and has about 8 Tordol left. He doesn't know if something stronger then Tordol cn be prescribed for his pain? He is just trying to not go to the hospital. Pt is requesting a cal back as soon as possible at [phone].  Thank you,  TRoth

## 2018-06-09 DIAGNOSIS — H34831 Tributary (branch) retinal vein occlusion, right eye, with macular edema: Secondary | ICD-10-CM | POA: Diagnosis not present

## 2018-06-09 DIAGNOSIS — S0501XD Injury of conjunctiva and corneal abrasion without foreign body, right eye, subsequent encounter: Secondary | ICD-10-CM | POA: Diagnosis not present

## 2018-06-09 NOTE — Telephone Encounter (Signed)
Returned phone call to the patient regarding a different pain medication.     Patient reports that he has been using Toradol as needed for his pain. He said that the pain has been manageable the last couple of days. States that he took the medication 1 hour apart for 3 doses total, and his pain was relieved. Educated on the importance of taking the medication as prescribed to preserve renal function. Pt verbalized understanding.     Pt asked for refill due to only having 9 tabs left. Will call on Thursday of this week should he need a refill for the weekend. All questions were answered

## 2018-06-18 ENCOUNTER — Ambulatory Visit: Attending: Urology | Primary: Family Medicine

## 2018-06-18 ENCOUNTER — Ambulatory Visit: Admit: 2018-06-18 | Payer: MEDICARE | Attending: Urology | Primary: Family Medicine

## 2018-06-18 ENCOUNTER — Encounter: Payer: MEDICARE | Attending: Family | Primary: Family Medicine

## 2018-06-18 DIAGNOSIS — N200 Calculus of kidney: Secondary | ICD-10-CM

## 2018-06-18 NOTE — Progress Notes (Addendum)
Telemedicine Visit  Tommy Brown  DOB 14-Oct-1945   Encounter Date: 06/18/2018       Encounter Diagnoses     ICD-10-CM ICD-9-CM   1. Nephrolithiasis N20.0 592.0        ASSESSMENT:     -urinary stones, passed his recent symptomatic stone, now no flank pain  -recent CT reviewed, no evidence of obstructing stones  -now with severe fatigue and poor appetite  -he was taking Toradol and reports taking up to 4 in a short period of time  -we discussed, if his labs are normal, he will need to f/u with Dr Cala Bradford    PLAN:      I recommended he get a BMP and CBC.  If normal and his symptoms persist he will need to f/u with his PCP regarding his fatigue  rx sent to [facility] per his request  OK to f/u with KUB  F/u with Dr Donovan Kail as scheduled      Chief Complaint   Patient presents with   ??? Kidney Stone     wife is Production designer, theatre/television/film       HISTORY OF PRESENT ILLNESS:  Tommy Brown is a 73 y.o. male who presents for f/u regarding his urinary stone disease    He reports he passed a stone on 06/14/18  He has had difficulty with weakness - started 3-4 weeks ago  He hasn't had toradol for the past week  He has had a stomach ache  Unable to get out of bed    He had pain 3 weeks ago up until 4/19  His wife had a picture - about 3 mm stone    he reports: 0-1x per night nocturia  does not have urinary frequency  does not have urinary urgency  does not have urinary hesitancy  does not have dysuria  does not have the sense of PVR  good FOS  No gross hematuria    CT 05/21/2018 reviewed:  IMPRESSION:  1. Diverticulosis.  2. Enlarged prostate.  3. Hiatal hernia.  4. Status post Whipple's procedure, cholecystectomy  5. Bilateral nonobstructing nephrolithiasis, largest 8 mm left renal stone.  6. 1.2 x 1.1 cm right hepatic lobe lesion  7. Subcentimeter renal and hepatic hypodensities too small to characterize  8. Too numerous to characterize bilateral renal hypodensities, some demonstrate  proteinaceous or hemorrhagic cysts  9. Penile implant  ??   Patient reports that he was previously experiencing pain, but today, the pain is resolved. Continues to take Flomax, but has not observed a passed stone. He admits that he has not been straining urine. Denies gross hematuria, dysuria, flank pain, and no f/c/n/v.   ??  Prior GU History:  Moved to [facility] in 09/2017  Was followed by urologist for hypogonadism. Has been on TRT for several years. Last injection was around 10/26/2017  Since he has not had injection recently, he has symptoms of low testosterone.   ??  Patient denies any urinary symptoms.   No frequency, urgency, or incontinence.  Denies any straining to void.   ??  H/o kidney stones. Passed spontaneously.   Denies any recent UTI's.   No flank or abdominal pain  Denies gross hematuria or dysuria  No f/n/v/c   ??  PMHx:  - H/o pancreatic cancer, s/p Whipple procedure 2006 and chemotherapy radiation   ??    Past Medical History:   Diagnosis Date   ??? Arthritis    ??? Benign prostate hyperplasia    ???  Bladder infection    ??? Chronic kidney disease    ??? Coronary arteriosclerosis    ??? Coronary atherosclerosis of artery bypass graft    ??? Decreased testosterone level    ??? Diverticular disease    ??? Essential hypertension    ??? Gout    ??? History of acute renal failure    ??? History of anemia    ??? History of malignant neoplasm of pancreas    ??? Hx of CABG    ??? Kidney stone    ??? Kidney stones    ??? Neuropathy    ??? Pancreatic cancer ([place])    ??? Sepsis ([place])    ??? Tremor      Past Surgical History:   Procedure Laterality Date   ??? HX ORTHOPAEDIC      back   ??? HX OTHER SURGICAL  2006    whipple procedure done for pancreatic cancer   ??? HX UROLOGICAL  2012    Percutaneous extraction of a kidney stone w/ fragmentation procedure      Social History     Tobacco Use   ??? Smoking status: Never Smoker   ??? Smokeless tobacco: Never Used   Substance Use Topics   ??? Alcohol use: Not Currently   ??? Drug use: Not Currently     Allergies   Allergen Reactions   ??? Other Food Other (comments)     VISTAID   ANESTHESIA   ??? Codeine Itching and Swelling   ??? Oxycodone Anaphylaxis   ??? Hydroxyzine Pamoate Itching and Other (comments)     Other reaction(s): Unknown     ??? Meperidine Itching, Palpitations, Swelling and Other (comments)     Other reaction(s): Unknown     ??? Metoclopramide Itching, Other (comments) and Swelling     Other reaction(s): Other (See Comments)  Reaction unknown   Reaction unknown      ??? Morphine Other (comments) and Nausea and Vomiting   ??? Oxycodone-Acetaminophen Itching   ??? Prochlorperazine Itching, Other (comments) and Unknown (comments)     Other reaction(s): Unknown  Unsure of reaction     ??? Hydroxyzine Hcl Hives     Family History   Problem Relation Age of Onset   ??? Alcohol abuse Father    ??? Heart Disease Brother    ??? Emphysema Brother      Current Outpatient Medications   Medication Sig Dispense Refill   ??? metoprolol tartrate (LOPRESSOR) 25 mg tablet Take 25 mg by mouth daily.     ??? gabapentin (NEURONTIN) 600 mg tablet Take 600 mg by mouth two (2) times a day.     ??? amLODIPine (NORVASC) 5 mg tablet Take 5 mg by mouth daily.     ??? allopurinol (ZYLOPRIM) 300 mg tablet Take  by mouth daily.     ??? tamsulosin (FLOMAX) 0.4 mg capsule Take 0.4 mg by mouth daily.     ??? lipase-protease-amylase (ZENPEP) 20,000-63,000- 84,000 unit capsule Take 8 Caps by mouth daily.     ??? fluticasone propionate 100 mcg/actuation dsdv Take  by inhalation.     ??? aspirin 81 mg chewable tablet Take 81 mg by mouth daily.     ??? ketorolac (TORADOL) 10 mg tablet Take 1 Tab by mouth every six (6) hours as needed for Pain. 20 Tab 5   ??? tiZANidine (ZANAFLEX) 4 mg tablet Take 4 mg by mouth nightly.     ??? TESTOSTERONE IM by IntraMUSCular route.  Review of Systems  Constitutional: Fever: Yes  Skin: Rash: No  HEENT: Hearing difficulty: No  Eyes: Blurred vision: No  Cardiovascular: Chest pain: No  Respiratory: Shortness of breath: No  Gastrointestinal: Nausea/vomiting: No  Musculoskeletal: Back pain: No  Neurological: Weakness: No   Psychological: Memory loss: No  Comments/additional findings:         PHYSICAL EXAMINATION:   Visit Vitals  Ht 5\' 8"  (1.727 m)   Wt 140 lb (63.5 kg)   BMI 21.29 kg/m??     Constitutional: Well developed, well-nourished in no acute distress.   Respiratory: No respiratory distress or difficulties     Skin:  Normal color. No evidence of jaundice.     Neuro/Psych:  Patient with appropriate affect.  Alert and oriented.          REVIEW OF LABS AND IMAGING:    Results for orders placed or performed during the hospital encounter of 05/21/18   CREATININE, POC   Result Value Ref Range    Creatinine 1.1 0.6 - 1.3 mg/dl               A copy of today's office visit with all pertinent imaging results and labs were sent to the referring physician.    CC: Deguzman-Berube, Aundra Dubin, MD     Domenic Schwab, MD       This visit was conducted as a synchronous telemedicine service rendered via real-time interactive audio and video telecommunications system. On May 06, 2018, the [place] declared the COVID-19 (Novel Coronavirus) viral disease to be a pandemic. As a result of this emergency, a rapidly evolving situation, practice patterns for physicians, physician assistants, and nurse practitioners are shifting to accommodate the need to treat in conjunction with unprecedented guidance from federal, state, and local authorities??? which include, but are not limited to, self-quarantines and/or limiting physical proximity to others under any number of circumstances.   It is within this context (and with the understanding that this method of patient encounter is in the patient???s best interest as well as the health and safety of other patients and the public) that ???telehealth??? is being provided for this patient encounter rather than a face-to-face visit. This patient encounter is appropriate and reasonable under the circumstances given the patient???s particular presentation at this time. The patient has been advised of the potential risks and limitations of this mode of treatment (including, but not limited to, the absence of in-person examination) and has agreed to be treated in a remote fashion in spite of them. Any and all of the patient???s/patient???s family???s questions on this issue have been answered, and I have made no promises or guarantees to the patient. The patient has also been advised to contact this office for worsening conditions or problems, and seek emergency medical treatment and/or call 911 if the patient deems either necessary.

## 2018-06-18 NOTE — Progress Notes (Signed)
Progress Notes by Domenic Schwab, MD at 06/18/18 1130                Author: Domenic Schwab, MD  Service: --  Author Type: Physician       Filed: 06/19/18 0943  Encounter Date: 06/18/2018  Status: Addendum          Editor: Domenic Schwab, MD (Physician)          Related Notes: Original Note by Domenic Schwab, MD (Physician) filed at 06/19/18 [place]   DOB 1945/10/23    Encounter Date: 06/18/2018            Encounter Diagnoses              ICD-10-CM  ICD-9-CM          1.  Nephrolithiasis  N20.0  592.0            ASSESSMENT:       -urinary stones, passed his recent symptomatic stone, now no flank pain   -recent CT reviewed, no evidence of obstructing stones   -now with severe fatigue and poor appetite   -he was taking Toradol and reports taking up to 4 in a short period of time   -we discussed, if his labs are normal, he will need to f/u with Dr Cala Bradford      PLAN:        I recommended he get a BMP and CBC.  If normal and his symptoms persist he will need to f/u with his PCP regarding his fatigue   rx sent to [facility] per his request   OK to f/u with KUB   F/u with Dr Donovan Kail as scheduled           Chief Complaint       Patient presents with        ?  Kidney Stone             wife is Production designer, theatre/television/film           HISTORY OF PRESENT ILLNESS:  Tommy Brown  is a 73 y.o. male who presents  for f/u regarding his urinary stone disease      He reports he passed a stone on 06/14/18   He has had difficulty with weakness - started 3-4 weeks ago   He hasn't had toradol for the past week   He has had a stomach ache   Unable to get out of bed      He had pain 3 weeks ago up until 4/19   His wife had a picture - about 3 mm stone      he reports: 0-1x per night nocturia   does not have urinary frequency   does not have urinary urgency   does not have urinary hesitancy   does not have dysuria   does not have the sense of PVR   good FOS   No gross hematuria      CT 05/21/2018  reviewed:   IMPRESSION:   1. Diverticulosis.   2. Enlarged prostate.   3. Hiatal hernia.   4. Status post Whipple's procedure, cholecystectomy   5. Bilateral nonobstructing nephrolithiasis, largest 8 mm left renal stone.   6. 1.2 x 1.1 cm right hepatic lobe lesion   7. Subcentimeter renal and hepatic  hypodensities too small to characterize   8. Too numerous to characterize bilateral renal hypodensities, some demonstrate   proteinaceous or hemorrhagic cysts   9. Penile implant   ??   Patient reports that he was previously experiencing pain, but today, the pain is resolved. Continues to take Flomax, but has not observed a passed stone. He admits that he has not been straining urine. Denies gross hematuria, dysuria, flank pain, and  no f/c/n/v.    ??   Prior GU History:   Moved to [facility] in 09/2017   Was followed by urologist for hypogonadism. Has been on TRT for several years. Last injection was around 10/26/2017   Since he has not had injection recently, he has symptoms of low testosterone.    ??   Patient denies any urinary symptoms.    No frequency, urgency, or incontinence.   Denies any straining to void.    ??   H/o kidney stones. Passed spontaneously.    Denies any recent UTI's.    No flank or abdominal pain   Denies gross hematuria or dysuria   No f/n/v/c    ??   PMHx:   - H/o pancreatic cancer, s/p Whipple procedure 2006 and chemotherapy radiation    ??        Past Medical History:        Diagnosis  Date         ?  Arthritis       ?  Benign prostate hyperplasia       ?  Bladder infection       ?  Chronic kidney disease       ?  Coronary arteriosclerosis       ?  Coronary atherosclerosis of artery bypass graft       ?  Decreased testosterone level       ?  Diverticular disease           ?  Essential hypertension           ?  Gout       ?  History of acute renal failure       ?  History of anemia       ?  History of malignant neoplasm of pancreas       ?  Hx of CABG       ?  Kidney stone       ?  Kidney stones        ?  Neuropathy       ?  Pancreatic cancer ([place])       ?  Sepsis ([place])           ?  Tremor            Past Surgical History:         Procedure  Laterality  Date          ?  HX ORTHOPAEDIC              back          ?  HX OTHER SURGICAL    2006          whipple procedure done for pancreatic cancer          ?  HX UROLOGICAL    2012          Percutaneous extraction of a kidney stone w/ fragmentation procedure           Social History  Tobacco Use         ?  Smoking status:  Never Smoker     ?  Smokeless tobacco:  Never Used       Substance Use Topics         ?  Alcohol use:  Not Currently         ?  Drug use:  Not Currently          Allergies        Allergen  Reactions         ?  Other Food  Other (comments)             VISTAID   ANESTHESIA         ?  Codeine  Itching and Swelling     ?  Oxycodone  Anaphylaxis     ?  Hydroxyzine Pamoate  Itching and Other (comments)             Other reaction(s): Unknown            ?  Meperidine  Itching, Palpitations, Swelling and Other (comments)             Other reaction(s): Unknown            ?  Metoclopramide  Itching, Other (comments) and Swelling             Other reaction(s): Other (See Comments)   Reaction unknown    Reaction unknown             ?  Morphine  Other (comments) and Nausea and Vomiting     ?  Oxycodone-Acetaminophen  Itching     ?  Prochlorperazine  Itching, Other (comments) and Unknown (comments)             Other reaction(s): Unknown   Unsure of reaction            ?  Hydroxyzine Hcl  Hives          Family History         Problem  Relation  Age of Onset          ?  Alcohol abuse  Father       ?  Heart Disease  Brother            ?  Emphysema  Brother            Current Outpatient Medications          Medication  Sig  Dispense  Refill           ?  metoprolol tartrate (LOPRESSOR) 25 mg tablet  Take 25 mg by mouth daily.         ?  gabapentin (NEURONTIN) 600 mg tablet  Take 600 mg by mouth two (2) times a day.         ?  amLODIPine (NORVASC) 5 mg tablet   Take 5 mg by mouth daily.         ?  allopurinol (ZYLOPRIM) 300 mg tablet  Take  by mouth daily.         ?  tamsulosin (FLOMAX) 0.4 mg capsule  Take 0.4 mg by mouth daily.         ?  lipase-protease-amylase (ZENPEP) 20,000-63,000- 84,000 unit capsule  Take 8 Caps by mouth daily.         ?  fluticasone propionate 100 mcg/actuation dsdv  Take  by inhalation.         ?  aspirin 81 mg chewable tablet  Take 81 mg by mouth daily.         ?  ketorolac (TORADOL) 10 mg tablet  Take 1 Tab by mouth every six (6) hours as needed for Pain.  20 Tab  5     ?  tiZANidine (ZANAFLEX) 4 mg tablet  Take 4 mg by mouth nightly.               ?  TESTOSTERONE IM  by IntraMUSCular route.                  Review of Systems   Constitutional: Fever: Yes   Skin: Rash: No   HEENT: Hearing difficulty: No   Eyes: Blurred vision: No   Cardiovascular: Chest pain: No   Respiratory: Shortness of breath: No   Gastrointestinal: Nausea/vomiting: No   Musculoskeletal: Back pain: No   Neurological: Weakness: No   Psychological: Memory loss: No   Comments/additional findings:             PHYSICAL EXAMINATION:    Visit Vitals      Ht  5\' 8"  (1.727 m)     Wt  140 lb (63.5 kg)        BMI  21.29 kg/m??        Constitutional: Well developed, well-nourished in no acute distress.    Respiratory: No respiratory distress or difficulties       Skin:  Normal color. No evidence of jaundice.      Neuro/Psych:  Patient with appropriate affect.  Alert and oriented.              REVIEW OF LABS AND IMAGING:       Results for orders placed or performed during the hospital encounter of 05/21/18     CREATININE, POC         Result  Value  Ref Range            Creatinine  1.1  0.6 - 1.3 mg/dl                       A copy of today's office visit with all pertinent imaging results and labs were sent to the referring physician.     CC: Deguzman-Berube, Aundra Dubin, MD       Domenic Schwab, MD          This visit was conducted as a synchronous telemedicine service rendered via  real-time interactive audio and video telecommunications system. On May 06, 2018, the [place] declared the COVID-19 (Novel Coronavirus) viral disease to be a pandemic. As a result of this emergency, a rapidly evolving situation, practice patterns for physicians, physician assistants, and nurse practitioners are shifting to accommodate  the need to treat in conjunction with unprecedented guidance from federal, state, and local authorities-- which include, but are not limited to, self-quarantines and/or limiting physical proximity to others under any number of circumstances.   It is within this context (and with the understanding that this method of patient encounter is in the patients best interest as well as the health and safety of other patients and the public) that telehealth is being provided  for this patient encounter rather than a face-to-face visit. This patient encounter is appropriate and reasonable under the circumstances given the patients particular presentation at this time. The patient has been advised of the potential risks  and limitations of this mode of treatment (including, but  not limited to, the absence of in-person examination) and has agreed to be treated in a remote fashion in spite of them. Any and all of the patients/patients familys questions  on this issue have been answered, and I have made no promises or guarantees to the patient. The patient has also been advised to contact this office for worsening conditions or problems, and seek emergency medical treatment and/or call 911 if the patient  deems either necessary.

## 2018-06-19 ENCOUNTER — Inpatient Hospital Stay: Admit: 2018-06-19 | Discharge: 2018-06-19 | Payer: MEDICARE | Primary: Family Medicine

## 2018-06-19 DIAGNOSIS — R05 Cough: Secondary | ICD-10-CM | POA: Diagnosis not present

## 2018-06-19 DIAGNOSIS — R509 Fever, unspecified: Secondary | ICD-10-CM | POA: Diagnosis not present

## 2018-06-19 LAB — METABOLIC PANEL, BASIC
Anion gap: 8 mmol/L (ref 5–15)
BUN: 57 mg/dl — ABNORMAL HIGH (ref 7–25)
CO2: 18 mEq/L — ABNORMAL LOW (ref 21–32)
Calcium: 10 mg/dl (ref 8.5–10.1)
Chloride: 111 mEq/L — ABNORMAL HIGH (ref 98–107)
Creatinine: 3.6 mg/dl — ABNORMAL HIGH (ref 0.6–1.3)
GFR est AA: 21
GFR est non-AA: 18
Glucose: 113 mg/dl — ABNORMAL HIGH (ref 74–106)
Potassium: 6 mEq/L — ABNORMAL HIGH (ref 3.5–5.1)
Sodium: 137 mEq/L (ref 136–145)

## 2018-06-19 LAB — CBC W/O DIFF
HCT: 31.4 % — ABNORMAL LOW (ref 37.0–50.0)
HGB: 9.4 gm/dl — ABNORMAL LOW (ref 12.4–17.2)
MCH: 26.4 pg (ref 23.0–34.6)
MCHC: 29.9 gm/dl — ABNORMAL LOW (ref 30.0–36.0)
MCV: 88.2 fL (ref 80.0–98.0)
MPV: 12 fL — ABNORMAL HIGH (ref 6.0–10.0)
PLATELET: 252 10*3/uL (ref 140–450)
RBC: 3.56 M/uL — ABNORMAL LOW (ref 3.80–5.70)
RDW-SD: 54.3 — ABNORMAL HIGH (ref 35.1–43.9)
WBC: 10.4 10*3/uL (ref 4.0–11.0)

## 2018-06-19 LAB — CBC
Hematocrit: 31.4 % — ABNORMAL LOW (ref 37.0–50.0)
Hemoglobin: 9.4 gm/dl — ABNORMAL LOW (ref 12.4–17.2)
MCH: 26.4 pg (ref 23.0–34.6)
MCHC: 29.9 gm/dl — ABNORMAL LOW (ref 30.0–36.0)
MCV: 88.2 fL (ref 80.0–98.0)
MPV: 12 fL — ABNORMAL HIGH (ref 6.0–10.0)
Platelets: 252 10*3/uL (ref 140–450)
RBC: 3.56 M/uL — ABNORMAL LOW (ref 3.80–5.70)
RDW-SD: 54.3 — ABNORMAL HIGH (ref 35.1–43.9)
WBC: 10.4 10*3/uL (ref 4.0–11.0)

## 2018-06-19 LAB — BASIC METABOLIC PANEL
Anion Gap: 8 mmol/L (ref 5–15)
BUN: 57 mg/dl — ABNORMAL HIGH (ref 7–25)
CO2: 18 mEq/L — ABNORMAL LOW (ref 21–32)
Calcium: 10 mg/dl (ref 8.5–10.1)
Chloride: 111 mEq/L — ABNORMAL HIGH (ref 98–107)
Creatinine: 3.6 mg/dl — ABNORMAL HIGH (ref 0.6–1.3)
EGFR IF NonAfrican American: 18
GFR African American: 21
Glucose: 113 mg/dl — ABNORMAL HIGH (ref 74–106)
Potassium: 6 mEq/L — ABNORMAL HIGH (ref 3.5–5.1)
Sodium: 137 mEq/L (ref 136–145)

## 2018-06-19 NOTE — Progress Notes (Signed)
CBC and BMP orders faxed to [facility].

## 2018-06-19 NOTE — Progress Notes (Signed)
CBC and BMP orders faxed to [facility].

## 2018-06-22 ENCOUNTER — Inpatient Hospital Stay
Admit: 2018-06-22 | Discharge: 2018-06-26 | Disposition: A | Discharge status: Home / Self Care | Payer: MEDICARE | Attending: Internal Medicine | Admitting: Internal Medicine

## 2018-06-22 ENCOUNTER — Inpatient Hospital Stay: Admit: 2018-06-22 | Payer: MEDICARE | Attending: Urology | Primary: Family Medicine

## 2018-06-22 ENCOUNTER — Inpatient Hospital Stay: Admit: 2018-06-22 | Payer: MEDICARE | Primary: Family Medicine

## 2018-06-22 DIAGNOSIS — N136 Pyonephrosis: Principal | ICD-10-CM

## 2018-06-22 DIAGNOSIS — E785 Hyperlipidemia, unspecified: Secondary | ICD-10-CM | POA: Diagnosis present

## 2018-06-22 DIAGNOSIS — N281 Cyst of kidney, acquired: Secondary | ICD-10-CM | POA: Diagnosis present

## 2018-06-22 DIAGNOSIS — B9620 Unspecified Escherichia coli [E. coli] as the cause of diseases classified elsewhere: Secondary | ICD-10-CM | POA: Diagnosis present

## 2018-06-22 DIAGNOSIS — E861 Hypovolemia: Secondary | ICD-10-CM | POA: Diagnosis present

## 2018-06-22 DIAGNOSIS — Z951 Presence of aortocoronary bypass graft: Secondary | ICD-10-CM | POA: Diagnosis not present

## 2018-06-22 DIAGNOSIS — Z8507 Personal history of malignant neoplasm of pancreas: Secondary | ICD-10-CM | POA: Diagnosis not present

## 2018-06-22 DIAGNOSIS — Z87442 Personal history of urinary calculi: Secondary | ICD-10-CM | POA: Diagnosis not present

## 2018-06-22 DIAGNOSIS — Z791 Long term (current) use of non-steroidal anti-inflammatories (NSAID): Secondary | ICD-10-CM | POA: Diagnosis not present

## 2018-06-22 DIAGNOSIS — E875 Hyperkalemia: Secondary | ICD-10-CM | POA: Diagnosis present

## 2018-06-22 DIAGNOSIS — N500 Atrophy of testis: Secondary | ICD-10-CM | POA: Diagnosis not present

## 2018-06-22 DIAGNOSIS — Z90411 Acquired partial absence of pancreas: Secondary | ICD-10-CM | POA: Diagnosis not present

## 2018-06-22 DIAGNOSIS — N201 Calculus of ureter: Secondary | ICD-10-CM | POA: Diagnosis not present

## 2018-06-22 DIAGNOSIS — N209 Urinary calculus, unspecified: Secondary | ICD-10-CM | POA: Diagnosis not present

## 2018-06-22 DIAGNOSIS — I2510 Atherosclerotic heart disease of native coronary artery without angina pectoris: Secondary | ICD-10-CM | POA: Diagnosis not present

## 2018-06-22 DIAGNOSIS — E872 Acidosis: Secondary | ICD-10-CM | POA: Diagnosis present

## 2018-06-22 DIAGNOSIS — N179 Acute kidney failure, unspecified: Secondary | ICD-10-CM | POA: Diagnosis not present

## 2018-06-22 DIAGNOSIS — M109 Gout, unspecified: Secondary | ICD-10-CM | POA: Diagnosis present

## 2018-06-22 DIAGNOSIS — Z7982 Long term (current) use of aspirin: Secondary | ICD-10-CM | POA: Diagnosis not present

## 2018-06-22 DIAGNOSIS — N183 Chronic kidney disease, stage 3 (moderate): Secondary | ICD-10-CM | POA: Diagnosis present

## 2018-06-22 DIAGNOSIS — N1330 Unspecified hydronephrosis: Secondary | ICD-10-CM | POA: Diagnosis not present

## 2018-06-22 DIAGNOSIS — N400 Enlarged prostate without lower urinary tract symptoms: Secondary | ICD-10-CM | POA: Diagnosis present

## 2018-06-22 DIAGNOSIS — I129 Hypertensive chronic kidney disease with stage 1 through stage 4 chronic kidney disease, or unspecified chronic kidney disease: Secondary | ICD-10-CM | POA: Diagnosis not present

## 2018-06-22 DIAGNOSIS — Z79899 Other long term (current) drug therapy: Secondary | ICD-10-CM | POA: Diagnosis not present

## 2018-06-22 DIAGNOSIS — I10 Essential (primary) hypertension: Secondary | ICD-10-CM | POA: Diagnosis not present

## 2018-06-22 DIAGNOSIS — N134 Hydroureter: Secondary | ICD-10-CM | POA: Diagnosis not present

## 2018-06-22 DIAGNOSIS — N132 Hydronephrosis with renal and ureteral calculous obstruction: Secondary | ICD-10-CM | POA: Diagnosis not present

## 2018-06-22 DIAGNOSIS — N139 Obstructive and reflux uropathy, unspecified: Secondary | ICD-10-CM | POA: Diagnosis not present

## 2018-06-22 DIAGNOSIS — N200 Calculus of kidney: Secondary | ICD-10-CM | POA: Diagnosis not present

## 2018-06-22 LAB — EKG, 12 LEAD, INITIAL
Atrial Rate: 74 {beats}/min
Calculated P Axis: 4 degrees
Calculated R Axis: -33 degrees
Calculated T Axis: 50 degrees
Diagnosis: NORMAL
P-R Interval: 178 ms
Q-T Interval: 372 ms
QRS Duration: 98 ms
QTC Calculation (Bezet): 412 ms
Ventricular Rate: 74 {beats}/min

## 2018-06-22 LAB — CBC WITH AUTOMATED DIFF
BASOPHILS: 0.1 % (ref 0–3)
EOSINOPHILS: 1.5 % (ref 0–5)
HCT: 29.9 % — ABNORMAL LOW (ref 37.0–50.0)
HGB: 9.2 gm/dl — ABNORMAL LOW (ref 12.4–17.2)
IMMATURE GRANULOCYTES: 0.4 % (ref 0.0–3.0)
LYMPHOCYTES: 17.9 % — ABNORMAL LOW (ref 28–48)
MCH: 26.7 pg (ref 23.0–34.6)
MCHC: 30.8 gm/dl (ref 30.0–36.0)
MCV: 86.9 fL (ref 80.0–98.0)
MONOCYTES: 7 % (ref 1–13)
MPV: 11.1 fL — ABNORMAL HIGH (ref 6.0–10.0)
NEUTROPHILS: 73.1 % — ABNORMAL HIGH (ref 34–64)
NRBC: 0 (ref 0–0)
PLATELET: 276 10*3/uL (ref 140–450)
RBC: 3.44 M/uL — ABNORMAL LOW (ref 3.80–5.70)
RDW-SD: 53 — ABNORMAL HIGH (ref 35.1–43.9)
WBC: 7.4 10*3/uL (ref 4.0–11.0)

## 2018-06-22 LAB — METABOLIC PANEL, BASIC
Anion gap: 8 mmol/L (ref 5–15)
BUN: 60 mg/dl — ABNORMAL HIGH (ref 7–25)
CO2: 19 mEq/L — ABNORMAL LOW (ref 21–32)
Calcium: 9.2 mg/dl (ref 8.5–10.1)
Chloride: 113 mEq/L — ABNORMAL HIGH (ref 98–107)
Creatinine: 3.7 mg/dl — ABNORMAL HIGH (ref 0.6–1.3)
GFR est AA: 21
GFR est non-AA: 17
Glucose: 124 mg/dl — ABNORMAL HIGH (ref 74–106)
Potassium: 5.1 mEq/L (ref 3.5–5.1)
Sodium: 140 mEq/L (ref 136–145)

## 2018-06-22 LAB — URINALYSIS W/ RFLX MICROSCOPIC
Bilirubin, Urine: NEGATIVE
Bilirubin: NEGATIVE
Blood, Urine: NEGATIVE
Blood: NEGATIVE
Glucose, Ur: NEGATIVE mg/dl
Glucose: NEGATIVE mg/dl
Ketone: NEGATIVE mg/dl
Ketones, Urine: NEGATIVE mg/dl
Leukocyte Esterase, Urine: NEGATIVE
Leukocyte Esterase: NEGATIVE
Nitrite, Urine: NEGATIVE
Nitrites: NEGATIVE
Protein, UA: NEGATIVE mg/dl
Protein: NEGATIVE mg/dl
Specific Gravity, UA: 1.025 (ref 1.005–1.030)
Specific gravity: 1.025 (ref 1.005–1.030)
Urobilinogen, UA, POCT: 0.2 mg/dl (ref 0.0–1.0)
Urobilinogen: 0.2 mg/dl (ref 0.0–1.0)
pH (UA): 5.5 (ref 5.0–9.0)
pH, UA: 5.5 (ref 5.0–9.0)

## 2018-06-22 LAB — BASIC METABOLIC PANEL
Anion Gap: 8 mmol/L (ref 5–15)
BUN: 60 mg/dl — ABNORMAL HIGH (ref 7–25)
CO2: 19 mEq/L — ABNORMAL LOW (ref 21–32)
Calcium: 9.2 mg/dl (ref 8.5–10.1)
Chloride: 113 mEq/L — ABNORMAL HIGH (ref 98–107)
Creatinine: 3.7 mg/dl — ABNORMAL HIGH (ref 0.6–1.3)
EGFR IF NonAfrican American: 17
GFR African American: 21
Glucose: 124 mg/dl — ABNORMAL HIGH (ref 74–106)
Potassium: 5.1 mEq/L (ref 3.5–5.1)
Sodium: 140 mEq/L (ref 136–145)

## 2018-06-22 LAB — CBC WITH AUTO DIFFERENTIAL
Basophils %: 0.1 % (ref 0–3)
Eosinophils %: 1.5 % (ref 0–5)
Hematocrit: 29.9 % — ABNORMAL LOW (ref 37.0–50.0)
Hemoglobin: 9.2 gm/dl — ABNORMAL LOW (ref 12.4–17.2)
Immature Granulocytes: 0.4 % (ref 0.0–3.0)
Lymphocytes %: 17.9 % — ABNORMAL LOW (ref 28–48)
MCH: 26.7 pg (ref 23.0–34.6)
MCHC: 30.8 gm/dl (ref 30.0–36.0)
MCV: 86.9 fL (ref 80.0–98.0)
MPV: 11.1 fL — ABNORMAL HIGH (ref 6.0–10.0)
Monocytes %: 7 % (ref 1–13)
Neutrophils %: 73.1 % — ABNORMAL HIGH (ref 34–64)
Nucleated RBCs: 0 (ref 0–0)
Platelets: 276 10*3/uL (ref 140–450)
RBC: 3.44 M/uL — ABNORMAL LOW (ref 3.80–5.70)
RDW-SD: 53 — ABNORMAL HIGH (ref 35.1–43.9)
WBC: 7.4 10*3/uL (ref 4.0–11.0)

## 2018-06-22 LAB — EKG 12-LEAD
Atrial Rate: 74 {beats}/min
Diagnosis: NORMAL
P Axis: 4 degrees
P-R Interval: 178 ms
Q-T Interval: 372 ms
QRS Duration: 98 ms
QTc Calculation (Bazett): 412 ms
R Axis: -33 degrees
T Axis: 50 degrees
Ventricular Rate: 74 {beats}/min

## 2018-06-22 MED ORDER — SODIUM CHLORIDE 0.9 % IJ SYRG
INTRAMUSCULAR | Status: DC | PRN
Start: 2018-06-22 — End: 2018-06-22

## 2018-06-22 MED ORDER — LEVOFLOXACIN IN D5W 500 MG/100 ML IV PIGGY BACK
500 mg/100 mL | Freq: Once | INTRAVENOUS | Status: AC
Start: 2018-06-22 — End: 2018-06-22
  Administered 2018-06-22: 16:00:00 via INTRAVENOUS

## 2018-06-22 MED ORDER — LIDOCAINE (PF) 20 MG/ML (2 %) IJ SOLN
20 mg/mL (2 %) | INTRAMUSCULAR | Status: DC | PRN
Start: 2018-06-22 — End: 2018-06-22
  Administered 2018-06-22: 20:00:00 via INTRAVENOUS

## 2018-06-22 MED ORDER — METOPROLOL SUCCINATE SR 25 MG 24 HR TAB
25 mg | Freq: Every day | ORAL | Status: DC
Start: 2018-06-22 — End: 2018-06-26
  Administered 2018-06-23 – 2018-06-26 (×4): via ORAL

## 2018-06-22 MED ORDER — ACETAMINOPHEN 650 MG RECTAL SUPPOSITORY
650 mg | RECTAL | Status: DC | PRN
Start: 2018-06-22 — End: 2018-06-26

## 2018-06-22 MED ORDER — NALOXONE 0.4 MG/ML INJECTION
0.4 mg/mL | INTRAMUSCULAR | Status: DC | PRN
Start: 2018-06-22 — End: 2018-06-22

## 2018-06-22 MED ORDER — LACTATED RINGERS IV
INTRAVENOUS | Status: DC
Start: 2018-06-22 — End: 2018-06-22
  Administered 2018-06-22: 20:00:00 via INTRAVENOUS

## 2018-06-22 MED ORDER — GABAPENTIN 300 MG CAP
300 mg | Freq: Two times a day (BID) | ORAL | Status: DC
Start: 2018-06-22 — End: 2018-06-22

## 2018-06-22 MED ORDER — HEPARIN (PORCINE) 5,000 UNIT/ML IJ SOLN
5000 unit/mL | Freq: Three times a day (TID) | INTRAMUSCULAR | Status: DC
Start: 2018-06-22 — End: 2018-06-23

## 2018-06-22 MED ORDER — SODIUM CHLORIDE 0.9 % IV
INTRAVENOUS | Status: DC
Start: 2018-06-22 — End: 2018-06-25
  Administered 2018-06-22 – 2018-06-25 (×7): via INTRAVENOUS

## 2018-06-22 MED ORDER — LIDOCAINE (PF) 20 MG/ML (2 %) IV SYRINGE
100 mg/5 mL (2 %) | INTRAVENOUS | Status: AC
Start: 2018-06-22 — End: ?

## 2018-06-22 MED ORDER — LEVOFLOXACIN IN D5W 250 MG/50 ML IV PIGGY BACK
250 mg/50 mL | INTRAVENOUS | Status: DC
Start: 2018-06-22 — End: 2018-06-23

## 2018-06-22 MED ORDER — ACETAMINOPHEN (TYLENOL) SOLUTION 32MG/ML
ORAL | Status: DC | PRN
Start: 2018-06-22 — End: 2018-06-26

## 2018-06-22 MED ORDER — LIDOCAINE HCL 1 % (10 MG/ML) IJ SOLN
10 mg/mL (1 %) | Freq: Once | INTRAMUSCULAR | Status: DC | PRN
Start: 2018-06-22 — End: 2018-06-22

## 2018-06-22 MED ORDER — PROPOFOL 10 MG/ML IV EMUL
10 mg/mL | INTRAVENOUS | Status: AC
Start: 2018-06-22 — End: ?

## 2018-06-22 MED ORDER — FENTANYL CITRATE (PF) 50 MCG/ML IJ SOLN
50 mcg/mL | INTRAMUSCULAR | Status: DC | PRN
Start: 2018-06-22 — End: 2018-06-22

## 2018-06-22 MED ORDER — NALOXONE 0.4 MG/ML INJECTION
0.4 mg/mL | INTRAMUSCULAR | Status: DC | PRN
Start: 2018-06-22 — End: 2018-06-26

## 2018-06-22 MED ORDER — POLYETHYLENE GLYCOL 3350 17 GRAM (100 %) ORAL POWDER PACKET
17 gram | Freq: Every day | ORAL | Status: DC | PRN
Start: 2018-06-22 — End: 2018-06-26

## 2018-06-22 MED ORDER — ACETAMINOPHEN 325 MG TABLET
325 mg | ORAL | Status: DC | PRN
Start: 2018-06-22 — End: 2018-06-26
  Administered 2018-06-25: 21:00:00 via ORAL

## 2018-06-22 MED ORDER — ONDANSETRON (PF) 4 MG/2 ML INJECTION
4 mg/2 mL | INTRAMUSCULAR | Status: DC | PRN
Start: 2018-06-22 — End: 2018-06-26
  Administered 2018-06-23 – 2018-06-24 (×3): via INTRAVENOUS

## 2018-06-22 MED ORDER — TAMSULOSIN SR 0.4 MG 24 HR CAP
0.4 mg | Freq: Every day | ORAL | Status: DC
Start: 2018-06-22 — End: 2018-06-26
  Administered 2018-06-23 – 2018-06-26 (×4): via ORAL

## 2018-06-22 MED ORDER — DEXAMETHASONE SODIUM PHOSPHATE 4 MG/ML IJ SOLN
4 mg/mL | INTRAMUSCULAR | Status: DC | PRN
Start: 2018-06-22 — End: 2018-06-22
  Administered 2018-06-22: 21:00:00 via INTRAVENOUS

## 2018-06-22 MED ORDER — LEVOFLOXACIN 250 MG TAB
250 mg | ORAL | Status: DC
Start: 2018-06-22 — End: 2018-06-22

## 2018-06-22 MED ORDER — ONDANSETRON (PF) 4 MG/2 ML INJECTION
4 mg/2 mL | INTRAMUSCULAR | Status: AC
Start: 2018-06-22 — End: ?

## 2018-06-22 MED ORDER — GENTAMICIN 40 MG/ML IJ SOLN
40 mg/mL | INTRAMUSCULAR | Status: AC
Start: 2018-06-22 — End: ?

## 2018-06-22 MED ORDER — SODIUM CHLORIDE 0.9% BOLUS IV
0.9 % | INTRAVENOUS | Status: AC
Start: 2018-06-22 — End: 2018-06-22
  Administered 2018-06-22: 13:00:00 via INTRAVENOUS

## 2018-06-22 MED ORDER — FLUMAZENIL 0.1 MG/ML IV SOLN
0.1 mg/mL | INTRAVENOUS | Status: DC | PRN
Start: 2018-06-22 — End: 2018-06-22

## 2018-06-22 MED ORDER — LABETALOL 5 MG/ML IV SOLN
5 mg/mL | INTRAVENOUS | Status: DC | PRN
Start: 2018-06-22 — End: 2018-06-22

## 2018-06-22 MED ORDER — ASPIRIN 81 MG CHEWABLE TAB
81 mg | Freq: Every day | ORAL | Status: DC
Start: 2018-06-22 — End: 2018-06-26
  Administered 2018-06-23 – 2018-06-26 (×4): via ORAL

## 2018-06-22 MED ORDER — AMLODIPINE 5 MG TAB
5 mg | Freq: Every day | ORAL | Status: DC
Start: 2018-06-22 — End: 2018-06-26
  Administered 2018-06-24 – 2018-06-26 (×3): via ORAL

## 2018-06-22 MED ORDER — HYDRALAZINE 20 MG/ML IJ SOLN
20 mg/mL | INTRAMUSCULAR | Status: DC | PRN
Start: 2018-06-22 — End: 2018-06-22

## 2018-06-22 MED ORDER — PROPOFOL 10 MG/ML IV EMUL
10 mg/mL | INTRAVENOUS | Status: DC | PRN
Start: 2018-06-22 — End: 2018-06-22
  Administered 2018-06-22: 20:00:00 via INTRAVENOUS

## 2018-06-22 MED ORDER — DEXAMETHASONE SODIUM PHOSPHATE 4 MG/ML IJ SOLN
4 mg/mL | INTRAMUSCULAR | Status: AC
Start: 2018-06-22 — End: ?

## 2018-06-22 MED FILL — LEVOFLOXACIN IN D5W 250 MG/50 ML IV PIGGY BACK: 250 mg/50 mL | INTRAVENOUS | Qty: 50

## 2018-06-22 MED FILL — DEXAMETHASONE SODIUM PHOSPHATE 4 MG/ML IJ SOLN: 4 mg/mL | INTRAMUSCULAR | Qty: 5

## 2018-06-22 MED FILL — SODIUM CHLORIDE 0.9 % IV: INTRAVENOUS | Qty: 1000

## 2018-06-22 MED FILL — DIPRIVAN 10 MG/ML INTRAVENOUS EMULSION: 10 mg/mL | INTRAVENOUS | Qty: 20

## 2018-06-22 MED FILL — LACTATED RINGERS IV: INTRAVENOUS | Qty: 1000

## 2018-06-22 MED FILL — LEVOFLOXACIN IN D5W 500 MG/100 ML IV PIGGY BACK: 500 mg/100 mL | INTRAVENOUS | Qty: 100

## 2018-06-22 MED FILL — GENTAMICIN 40 MG/ML IJ SOLN: 40 mg/mL | INTRAMUSCULAR | Qty: 2

## 2018-06-22 MED FILL — LIDOCAINE (PF) 20 MG/ML (2 %) IV SYRINGE: 100 mg/5 mL (2 %) | INTRAVENOUS | Qty: 5

## 2018-06-22 MED FILL — ONDANSETRON (PF) 4 MG/2 ML INJECTION: 4 mg/2 mL | INTRAMUSCULAR | Qty: 2

## 2018-06-22 NOTE — Progress Notes (Signed)
pt. in [id number] ,[name].is requesting for systane eye drops which he uses daily since his eye surgery to keep his eyes moist. Can we put the order in?

## 2018-06-22 NOTE — Telephone Encounter (Signed)
I reviewed Mr Tommy Brown labs with him this AM by phone -     Results for orders placed or performed during the hospital encounter of 06/19/18   CBC W/O DIFF   Result Value Ref Range    WBC 10.4 4.0 - 11.0 1000/mm3    RBC 3.56 (L) 3.80 - 5.70 M/uL    HGB 9.4 (L) 12.4 - 17.2 gm/dl    HCT 31.4 (L) 37.0 - 50.0 %    MCV 88.2 80.0 - 98.0 fL    MCH 26.4 23.0 - 34.6 pg    MCHC 29.9 (L) 30.0 - 36.0 gm/dl    PLATELET 252 140 - 450 1000/mm3    MPV 12.0 (H) 6.0 - 10.0 fL    RDW-SD 54.3 (H) 35.1 - 40.1     METABOLIC PANEL, BASIC   Result Value Ref Range    Sodium 137 136 - 145 mEq/L    Potassium 6.0 (H) 3.5 - 5.1 mEq/L    Chloride 111 (H) 98 - 107 mEq/L    CO2 18 (L) 21 - 32 mEq/L    Glucose 113 (H) 74 - 106 mg/dl    BUN 57 (H) 7 - 25 mg/dl    Creatinine 3.6 (H) 0.6 - 1.3 mg/dl    GFR est AA 21.0      GFR est non-AA 18      Calcium 10.0 8.5 - 10.1 mg/dl    Anion gap 8 5 - 15 mmol/L     His Cr is up as is his K+  He reported he is feeling better this AM.    I asked him to f/u at the ED to have his Cr and K rechecked.    Discussed with Dr Ysidro Evert in the [facility] ED as well.    VBrugh

## 2018-06-22 NOTE — ED Triage Notes (Signed)
Patient presents to the ED due to receiving a call from his urologist stating he needed to come to the ED right away due to abnormal lab results. Pt unsure what labs he was referencing. Only complaint patient has is that he has been feeling generally weak and unwell for 2 weeks. Denies fever or respiratory symptoms.

## 2018-06-22 NOTE — Other (Signed)
Received verbal SBAR report over telephonefrom Chief Strategy Officer Emergency room

## 2018-06-22 NOTE — ED Notes (Signed)
Pt taken to ultrasound

## 2018-06-22 NOTE — Other (Signed)
Melatonin 67mr ordered one time PO orders carried out.

## 2018-06-22 NOTE — Other (Signed)
TRANSFER - IN REPORT:    Tommy Brown  being received from [facility] for ordered procedure      Report consisted of patient???s Situation, Background, Assessment and   Recommendations(SBAR).     Information from the following report(s) MAR was reviewed with the receiving nurse.     Assessment completed upon patient???s arrival to unit and care assumed.

## 2018-06-22 NOTE — Anesthesia Post-Procedure Evaluation (Signed)
Procedure(s):  CYSTOSCOPY URETERAL STENT INSERTION OR REMOVAL.    general    Anesthesia Post Evaluation      Multimodal analgesia: multimodal analgesia used between 6 hours prior to anesthesia start to PACU discharge  Patient location during evaluation: PACU  Patient participation: complete - patient participated  Level of consciousness: awake  Pain management: satisfactory to patient  Airway patency: patent  Anesthetic complications: no  Cardiovascular status: stable  Respiratory status: acceptable  Hydration status: balanced  Post anesthesia nausea and vomiting:  none      No vitals data found for the desired time range.

## 2018-06-22 NOTE — Other (Signed)
1501 omnipaque 30 ml on field for retrogrades.Liane Comber op. Francene Boyers

## 2018-06-22 NOTE — Anesthesia Pre-Procedure Evaluation (Addendum)
Relevant Problems   No relevant active problems       Anesthetic History   No history of anesthetic complications            Review of Systems / Medical History  Patient summary reviewed and pertinent labs reviewed    Pulmonary  Within defined limits                 Neuro/Psych         Neuromuscular disease    Comments: neuropathy Cardiovascular    Hypertension          CAD and CABG         GI/Hepatic/Renal         Renal disease: stones and CRI       Endo/Other        Arthritis and cancer     Other Findings   Comments: Pancreatic CA hx           Physical Exam    Airway  Mallampati: II    Neck ROM: normal range of motion   Mouth opening: Normal     Cardiovascular    Rhythm: regular  Rate: normal         Dental  No notable dental hx       Pulmonary  Breath sounds clear to auscultation               Abdominal  GI exam deferred       Other Findings            Anesthetic Plan    ASA: 2  Anesthesia type: general          Induction: Intravenous  Anesthetic plan and risks discussed with: Patient

## 2018-06-22 NOTE — Op Note (Signed)
[facility]  Inpatient Operation Report  NAME:  Tommy Brown, Tommy Brown  SEX:   M  DATE: 06/22/2018  DOB: 1945-04-16  MR#    [id number]  ROOM:  1607  ACCT#  [id number]        PREPROCEDURE DIAGNOSIS:  Left obstructing ureteral calculi.    POSTPROCEDURE DIAGNOSIS:  Left obstructing ureteral calculi.    PROCEDURES PERFORMED:  1.  Cystoscopy.  2.  Left retrograde pyelogram with intraoperative interpretation.  3.  Left ureteral stent placement.    SURGEON:  Quinn Axe, MD    ASSISTANT:  None.    ANESTHESIA:  General.    FLUIDS:  Crystalloid.    ESTIMATED BLOOD LOSS:  Minimal.    SPECIMENS:  Bladder urine for culture.    DRAINS:  A 6-French x 24 cm ureteral stent, 16-French Foley catheter.    INDICATION FOR PROCEDURE:  The patient is a 73 year old male with a history of AKI and left obstructing ureteral calculus.  Risks, benefits, and alternatives were explained.  The patient decided to proceed.    INTRAOPERATIVE FINDINGS:  1.  On left retrograde pyelogram with intraoperative interpretation, the patient had a filling defect in the left proximal ureter.  Moderate hydronephrosis was noted.  No extravasation was noted.  2.  On cystourethroscopy, the patient had no bladder lesions.  Ureteral orifices were in orthotopic position.    DESCRIPTION OF PROCEDURE:   The patient first identified in the holding area and taken back to the operating room.  Perioperative antibiotics were given, Sequential compression device placed.  Anesthesia was induced.  The patient placed in dorsal lithotomy position, taking care to pad all pressure points.  The patient prepped and draped in usual sterile fashion.  Timeout was performed.     First, a 22-French rigid cystoscope was inserted into the patient's bladder.  Systematic cystoscopy was performed with 30 and 70-degree lenses, the findings as noted above.  Next, the left ureter was cannulated with a 0.038 Sensor wire up to level of renal pelvis.  Over the wire, a 5-French open-ended catheter was advanced up to level of the renal pelvis.  Wire was removed and aspirate was obtained.  Next, a left retrograde pyelogram through the 5-French was performed.  Findings as noted above.  Wire was placed, open-ended catheter was removed in push-pull technique.  A 6-French x 24 cm Bard double pigtail ureteral stent was deployed.  Good proximal and distal curls were noted.  Scope was removed.  Foley catheter was placed.  The case came to a conclusion.      ___________________  Kathreen Cornfield MD  Dictated By:.   AC  D:06/22/2018 17:24:13  T: 06/22/2018 18:58:54  [id number]

## 2018-06-22 NOTE — ED Provider Notes (Signed)
[place]  Emergency Department Treatment Report        Patient: Tommy Brown Sr Age: 73 y.o. Sex: male    Date of Birth: 08-01-1945 Admit Date: 06/22/2018 PCP: Daralene Milch, MD   MRN: [id number]  CSN: [id number]     Room: ER30/ER30 Time Dictated: 8:06 AM      Chief Complaint   Chief Complaint   Patient presents with   ??? Abnormal Lab Results       History of Present Illness   73 y.o. male with past medical history BPH, chronic kidney disease, nephrolithiasis, pancreatic cancer, hypertension, presenting to the emergency department after routine labs were found to show an AKI.  This is in the setting of the patient recently having a large kidney stone diagnosed and being placed on ketorolac by mouth.  The patient was taking up to 4 tabs of ketorolac at a time to help with his pain.  His urologist drew labs 3 days ago which resulted today which showed a creatinine of 3 from his baseline of 1.5.  The patient endorses general malaise and fatigue at this time, otherwise has no complaints noted.  Denies fevers, chills, nausea, vomiting and states his pain is under good control at this point.    Review of Systems   Review of Systems   Constitutional: Positive for malaise/fatigue. Negative for diaphoresis.   HENT: Negative for nosebleeds.    Eyes: Negative for double vision.   Respiratory: Negative for hemoptysis.    Cardiovascular: Negative for claudication.   Gastrointestinal: Negative for vomiting.   Genitourinary: Negative for hematuria.   Musculoskeletal: Negative for joint pain.   Skin: Negative for rash.   Neurological: Positive for weakness. Negative for focal weakness.   Endo/Heme/Allergies: Does not bruise/bleed easily.   Psychiatric/Behavioral: Negative for hallucinations.       Past Medical/Surgical History     Past Medical History:   Diagnosis Date   ??? Arthritis    ??? Benign prostate hyperplasia    ??? Bladder infection    ??? Chronic kidney disease    ??? Coronary arteriosclerosis     ??? Coronary atherosclerosis of artery bypass graft    ??? Decreased testosterone level    ??? Diverticular disease    ??? Essential hypertension    ??? Gout    ??? History of acute renal failure    ??? History of anemia    ??? History of malignant neoplasm of pancreas    ??? Hx of CABG    ??? Kidney stone    ??? Kidney stones    ??? Neuropathy    ??? Pancreatic cancer ([place])    ??? Sepsis ([place])    ??? Tremor      Past Surgical History:   Procedure Laterality Date   ??? HX ORTHOPAEDIC      back   ??? HX OTHER SURGICAL  2006    whipple procedure done for pancreatic cancer   ??? HX UROLOGICAL  2012    Percutaneous extraction of a kidney stone w/ fragmentation procedure        Social History     Social History     Socioeconomic History   ??? Marital status: MARRIED     Spouse name: Not on file   ??? Number of children: Not on file   ??? Years of education: Not on file   ??? Highest education level: Not on file   Occupational History   ??? Not on file  Social Needs   ??? Financial resource strain: Not on file   ??? Food insecurity     Worry: Not on file     Inability: Not on file   ??? Transportation needs     Medical: Not on file     Non-medical: Not on file   Tobacco Use   ??? Smoking status: Never Smoker   ??? Smokeless tobacco: Never Used   Substance and Sexual Activity   ??? Alcohol use: Not Currently   ??? Drug use: Not Currently   ??? Sexual activity: Not Currently   Lifestyle   ??? Physical activity     Days per week: Not on file     Minutes per session: Not on file   ??? Stress: Not on file   Relationships   ??? Social Product manager on phone: Not on file     Gets together: Not on file     Attends religious service: Not on file     Active member of club or organization: Not on file     Attends meetings of clubs or organizations: Not on file     Relationship status: Not on file   ??? Intimate partner violence     Fear of current or ex partner: Not on file     Emotionally abused: Not on file     Physically abused: Not on file     Forced sexual activity: Not on file    Other Topics Concern   ??? Not on file   Social History Narrative    ** Merged History Encounter **            Family History     Family History   Problem Relation Age of Onset   ??? Alcohol abuse Father    ??? Heart Disease Brother    ??? Emphysema Brother        Current Medications     Cannot display prior to admission medications because the patient has not been admitted in this contact.       Allergies     Allergies   Allergen Reactions   ??? Other Food Other (comments)     VISTAID  ANESTHESIA   ??? Codeine Itching and Swelling   ??? Oxycodone Anaphylaxis   ??? Hydroxyzine Pamoate Itching and Other (comments)     Other reaction(s): Unknown     ??? Meperidine Itching, Palpitations, Swelling and Other (comments)     Other reaction(s): Unknown     ??? Metoclopramide Itching, Other (comments) and Swelling     Other reaction(s): Other (See Comments)  Reaction unknown   Reaction unknown      ??? Morphine Other (comments) and Nausea and Vomiting   ??? Oxycodone-Acetaminophen Itching   ??? Prochlorperazine Itching, Other (comments) and Unknown (comments)     Other reaction(s): Unknown  Unsure of reaction     ??? Hydroxyzine Hcl Hives       Physical Exam     ED Triage Vitals   ED Encounter Vitals Group      BP       Pulse       Resp       Temp       Temp src       SpO2       Weight       Height        Physical Exam  Vitals signs and nursing note reviewed.   Constitutional:  Appearance: Normal appearance. He is not diaphoretic.   HENT:      Head: Normocephalic and atraumatic.      Mouth/Throat:      Mouth: Mucous membranes are moist.   Eyes:      General: No scleral icterus.     Pupils: Pupils are equal, round, and reactive to light.   Cardiovascular:      Rate and Rhythm: Normal rate and regular rhythm.      Heart sounds: Murmur present.   Pulmonary:      Effort: No respiratory distress.      Breath sounds: No stridor.   Abdominal:      General: There is no distension.      Palpations: Abdomen is soft.       Tenderness: There is no abdominal tenderness. There is no right CVA tenderness or left CVA tenderness.   Musculoskeletal:         General: No deformity.      Right lower leg: No edema.      Left lower leg: No edema.   Skin:     Capillary Refill: Capillary refill takes less than 2 seconds.      Findings: No rash.   Neurological:      General: No focal deficit present.      Mental Status: He is alert.   Psychiatric:         Mood and Affect: Mood and affect normal.        Impression and Management Plan   Well-appearing though tired 73 year old male with abnormal labs drawn 3 days ago.  Spoke with the patient's urologist; will obtain repeat laboratory evaluation today, provide hydration, check a urinalysis, check an EKG, to determine if the AKI is worsening or improving.  Otherwise the patient has no complaints at this time; no pain, no difficulty urinating, no hematuria, no other issues noted.    Diagnostic Studies   Lab:   Recent Results (from the past 12 hour(s))   CBC WITH AUTOMATED DIFF    Collection Time: 06/22/18  8:25 AM   Result Value Ref Range    WBC 7.4 4.0 - 11.0 1000/mm3    RBC 3.44 (L) 3.80 - 5.70 M/uL    HGB 9.2 (L) 12.4 - 17.2 gm/dl    HCT 29.9 (L) 37.0 - 50.0 %    MCV 86.9 80.0 - 98.0 fL    MCH 26.7 23.0 - 34.6 pg    MCHC 30.8 30.0 - 36.0 gm/dl    PLATELET 276 140 - 450 1000/mm3    MPV 11.1 (H) 6.0 - 10.0 fL    RDW-SD 53.0 (H) 35.1 - 43.9      NRBC 0 0 - 0      IMMATURE GRANULOCYTES 0.4 0.0 - 3.0 %    NEUTROPHILS 73.1 (H) 34 - 64 %    LYMPHOCYTES 17.9 (L) 28 - 48 %    MONOCYTES 7.0 1 - 13 %    EOSINOPHILS 1.5 0 - 5 %    BASOPHILS 0.1 0 - 3 %   METABOLIC PANEL, BASIC    Collection Time: 06/22/18  8:25 AM   Result Value Ref Range    Sodium 140 136 - 145 mEq/L    Potassium 5.1 3.5 - 5.1 mEq/L    Chloride 113 (H) 98 - 107 mEq/L    CO2 19 (L) 21 - 32 mEq/L    Glucose 124 (H) 74 - 106 mg/dl    BUN 60 (H) 7 -  25 mg/dl    Creatinine 3.7 (H) 0.6 - 1.3 mg/dl    GFR est AA 21.0      GFR est non-AA 17       Calcium 9.2 8.5 - 10.1 mg/dl    Anion gap 8 5 - 15 mmol/L     Labs Reviewed   CBC WITH AUTOMATED DIFF - Abnormal; Notable for the following components:       Result Value    RBC 3.44 (*)     HGB 9.2 (*)     HCT 29.9 (*)     MPV 11.1 (*)     RDW-SD 53.0 (*)     NEUTROPHILS 73.1 (*)     LYMPHOCYTES 17.9 (*)     All other components within normal limits   METABOLIC PANEL, BASIC - Abnormal; Notable for the following components:    Chloride 113 (*)     CO2 19 (*)     Glucose 124 (*)     BUN 60 (*)     Creatinine 3.7 (*)     All other components within normal limits   CULTURE, URINE   URINALYSIS W/ RFLX MICROSCOPIC       Imaging:    No results found.    EKG here normal sinus rhythm at a rate of 74 bpm.  Left axis deviation.  Normal intervals.  Flattened T's in aVL.  Biphasic T wave in V2, otherwise no ST changes throughout.    ED Course/MDM     I reviewed the patients vitals signs which showed hemodynamic stability    I reviewed pts prior imaging    ED Course as of Jun 21 948   Mon Jun 22, 2018   0920 Improved from prior value of 6.0.  EKG today without evidence of T wave abnormalities.   Potassium: 5.1 [TG]   0920 Sending creatinine noted; up from baseline of 1.5 now to 3.7.   Creatinine(!): 3.7 [TG]   0921 Anemia at baseline   CBC WITH AUTOMATED DIFF(!):    WBC 7.4   RBC 3.44(!)   HGB 9.2(!)   HCT 29.9(!)   MCV 86.9   MCH 26.7   MCHC 30.8   PLATELET 276   MPV 11.1(!)   RDW-SD 53.0(!)   NRBC 0   IMMATURE GRANULOCYTES 0.4   NEUTROPHILS 73.1(!)   LYMPHOCYTES 17.9(!)   MONOCYTES 7.0   EOSINOPHILS 1.5   BASOPHILS 0.1 [TG]   2355 Will discuss admission with [facility] hospitalists given new renal injury.    [TG]   [id number] Spoke to Urology PA Nira Conn who will consult. Spoke to [facility] hospitalists who will admit.    [TG]      ED Course User Index  [TG] Jari Sportsman, MD       Final Diagnosis       ICD-10-CM ICD-9-CM   1. AKI (acute kidney injury) ([place]) N17.9 584.9   2. Nephrolithiasis N20.0 592.0       Disposition   Admit       The patient was personally evaluated by myself and Dr. Ysidro Evert who agrees with the above assessment and plan.    Fredia Sorrow, MD  PGY-4 EM  June 22, 2018    My signature above authenticates this document and my orders, the final    diagnosis (es), discharge prescription (s), and instructions in the Epic    record.  If you have any questions please contact [phone].     Nursing notes have  been reviewed by the physician/ advanced practice    Clinician.    Dragon medical dictation software was used for portions of this report. Unintended voice recognition errors may occur.

## 2018-06-22 NOTE — Progress Notes (Signed)
Problem: Falls - Risk of  Goal: *Absence of Falls  Description: Document Tommy Brown Fall Risk and appropriate interventions in the flowsheet.  Outcome: Progressing Towards Goal  Note: Fall Risk Interventions:            Medication Interventions: Assess postural VS orthostatic hypotension, Patient to call before getting OOB, Utilize gait belt for transfers/ambulation                   Problem: Patient Education: Go to Patient Education Activity  Goal: Patient/Family Education  Outcome: Progressing Towards Goal

## 2018-06-22 NOTE — Other (Signed)
TRANSFER - OUT REPORT:    Verbal report given to Alma Friendly, Therapist, sports (name) on Raquon Milledge Sr  being transferred to [address](unit) for routine progression of care       Report consisted of patient???s Situation, Background, Assessment and   Recommendations(SBAR).     Information from the following report(s) SBAR, ED Summary, MAR, Recent Results, Med Rec Status and Cardiac Rhythm NSR was reviewed with the receiving nurse.    Lines:   Peripheral IV 06/22/18 Right Antecubital (Active)   Site Assessment Clean, dry, & intact 06/22/2018  8:25 AM   Phlebitis Assessment 0 06/22/2018  8:25 AM   Infiltration Assessment 0 06/22/2018  8:25 AM   Dressing Status Clean, dry, & intact 06/22/2018  8:25 AM   Dressing Type Transparent 06/22/2018  8:25 AM   Hub Color/Line Status Flushed 06/22/2018  8:25 AM        Opportunity for questions and clarification was provided.      Patient transported with:   [employer]

## 2018-06-22 NOTE — Progress Notes (Signed)
Bedside and Verbal shift change report given to Mary RN (oncoming nurse) by Lija RN (offgoing nurse). Report included the following information SBAR, Kardex, Intake/Output, MAR, Accordion, Recent Results and Med Rec Status.

## 2018-06-22 NOTE — Other (Signed)
TRANSFER - OUT REPORT:    Verbal report given to Sharla Kidney, Therapist, sports (name) on Zaid Tomes Sr  being transferred to 4W(unit) for routine post - op       Report consisted of patient???s Situation, Background, Assessment and   Recommendations(SBAR).     Information from the following report(s) SBAR, Kardex, OR Summary, Procedure Summary, Intake/Output and MAR was reviewed with the receiving nurse.    Lines:   Peripheral IV 06/22/18 Right Antecubital (Active)   Site Assessment Clean, dry, & intact 06/22/2018  5:03 PM   Phlebitis Assessment 0 06/22/2018  5:03 PM   Infiltration Assessment 0 06/22/2018  5:03 PM   Dressing Status Clean, dry, & intact 06/22/2018  5:03 PM   Dressing Type Transparent 06/22/2018  8:25 AM   Hub Color/Line Status Flushed 06/22/2018  8:25 AM        Opportunity for questions and clarification was provided.      Patient transported with:   [employer]

## 2018-06-22 NOTE — Consults (Addendum)
No diagnosis found.    ASSESSMENT:   - AKI with baseline creatinine 1.5   - Peak creatinine 3.7 (4/27)   - Current creatinine 3.7   - Likely due to large Toradol in take   - Hx of nephrolithiasis s/p URS with LL in 02/2017  - Hx of ED with IPP  - Hx of Pancreatic cancer s/p Whipple in 2006, chemotherapy and radiation       PLAN:    - Will obtain renal ultrasound to assess for hydronephrosis   - If no hydronephrosis, will defer treatment to primary team.  - If hydronephrosis, will need CT abd/pelvis without contrast to evaluate for obs lesion   - UA with trace blood. Will order urine culture - primary team to treat accordingly   - Patient states he is taking Levoquin. Cannot find Urine culture results from recent. Antibiotics as per primary team   - NPO until after all imaging done       - RUS shows left hydronephrosis, will order CT abd/pelvis without contrast. Keep NPO    Follow up arranged? NO - will need follow up with Dr. Enos Fling, PA-C    [phone]    The history, physical and relevant labs/imaging were reviewed by Arletha Grippe, MD on 06/22/2018 and agree with the physician assistant's assessment and plan.    Arletha Grippe MD  [facility] [place]               Chief Complaint   Patient presents with   ??? Abnormal Lab Results        HISTORY OF PRESENT ILLNESS:  Tommy Brown is a 73 y.o. male who is seen in consultation as referred by Dr. Forde Radon, MD for AKI with history of renal stones. Patient was seen by telemed visit on Friday in our office. At that time, patient had passed stone, but was not feeling well. Labs were obtained showing hyperkalemia (6.0) and AKI (creatinine 3.6). He was taking a large amount of Toradol prescribed by someone else. Today, Dr. Aurelio Jew called patient and advised to come into the ER for further evaluation. He has been feeling better and no flank pain. He states his pain was sharp on left lower quadrant. He did pass a kidney stone and since then pain has been better.     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 Stone was 95%CaOx. 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         No flowsheet data found.      Past Medical History:   Diagnosis Date   ??? Arthritis    ??? Benign prostate hyperplasia    ??? Bladder infection    ??? Chronic kidney disease    ??? Coronary arteriosclerosis    ??? Coronary atherosclerosis of artery bypass graft    ??? Decreased testosterone level    ??? Diverticular disease    ??? Essential hypertension    ??? Gout    ??? History of acute renal failure    ??? History of anemia    ??? History of malignant neoplasm of pancreas    ??? Hx of CABG    ??? Kidney stone    ??? Kidney stones    ??? Neuropathy    ??? Pancreatic cancer ([place])    ??? Sepsis ([place])    ??? Tremor  Past Surgical History:   Procedure Laterality Date   ??? HX ORTHOPAEDIC      back   ??? HX OTHER SURGICAL  2006    whipple procedure done for pancreatic cancer   ??? HX UROLOGICAL  2012    Percutaneous extraction of a kidney stone w/ fragmentation procedure        Social History     Tobacco Use   ??? Smoking status: Never Smoker   ??? Smokeless tobacco: Never Used   Substance Use Topics   ??? Alcohol use: Not Currently   ??? Drug use: Not Currently       Allergies   Allergen Reactions    ??? Other Food Other (comments)     VISTAID  ANESTHESIA   ??? Codeine Itching and Swelling   ??? Oxycodone Anaphylaxis   ??? Hydroxyzine Pamoate Itching and Other (comments)     Other reaction(s): Unknown     ??? Meperidine Itching, Palpitations, Swelling and Other (comments)     Other reaction(s): Unknown     ??? Metoclopramide Itching, Other (comments) and Swelling     Other reaction(s): Other (See Comments)  Reaction unknown   Reaction unknown      ??? Morphine Other (comments) and Nausea and Vomiting   ??? Oxycodone-Acetaminophen Itching   ??? Prochlorperazine Itching, Other (comments) and Unknown (comments)     Other reaction(s): Unknown  Unsure of reaction     ??? Hydroxyzine Hcl Hives       Family History   Problem Relation Age of Onset   ??? Alcohol abuse Father    ??? Heart Disease Brother    ??? Emphysema Brother        Current Outpatient Medications   Medication Sig Dispense Refill   ??? ketorolac (TORADOL) 10 mg tablet Take 1 Tab by mouth every six (6) hours as needed for Pain. 20 Tab 5   ??? metoprolol tartrate (LOPRESSOR) 25 mg tablet Take 25 mg by mouth daily.     ??? gabapentin (NEURONTIN) 600 mg tablet Take 600 mg by mouth two (2) times a day.     ??? amLODIPine (NORVASC) 5 mg tablet Take 5 mg by mouth daily.     ??? allopurinol (ZYLOPRIM) 300 mg tablet Take  by mouth daily.     ??? tamsulosin (FLOMAX) 0.4 mg capsule Take 0.4 mg by mouth daily.     ??? lipase-protease-amylase (ZENPEP) 20,000-63,000- 84,000 unit capsule Take 8 Caps by mouth daily.     ??? tiZANidine (ZANAFLEX) 4 mg tablet Take 4 mg by mouth nightly.     ??? fluticasone propionate 100 mcg/actuation dsdv Take  by inhalation.     ??? aspirin 81 mg chewable tablet Take 81 mg by mouth daily.     ??? TESTOSTERONE IM by IntraMUSCular route.         Review of Systems  ROS is:    Negative for: Ophthalmologic issues, ENT issues, Cardiovascular issues, respiratory issues, GI issues, neurologic issues, hematoogic issues, skin lesions, musculoskeletal issues, psychiatric issues   Exceptions: yes    Positive for:    Generalized weakness               PHYSICAL EXAMINATION:   Visit Vitals  BP 129/76 (BP 1 Location: Left arm, BP Patient Position: At rest)   Pulse 88   Temp 98.3 ??F (36.8 ??C)   Resp 15   Ht 5\' 8"  (1.727 m)   Wt 61.2 kg (135 lb)   SpO2 99%  BMI 20.53 kg/m??     Constitutional: Well developed, well nourished male.  No acute distress.    HEENT: Normocephalic, Atraumatic, EOM's intact   CV:  no edema  Respiratory: No respiratory distress or difficulties breathing   Abdomen:  soft and non tender. Non-distended    GU Male:  No CVA tenderness  DRE: Deferred  Skin: No evidence of jaundice.  Normal color  Neuro/Psych:  Alert and oriented. Affect appropriate.   MSK: Normal ROM       REVIEW OF LABS AND IMAGING:      Labs: Results:   Chemistry    Recent Labs     06/22/18  0825 06/19/18  1206   GLU 124* 113*   NA 140 137   K 5.1 6.0*   CL 113* 111*   CO2 19* 18*   BUN 60* 57*   CREA 3.7* 3.6*   CA 9.2 10.0   AGAP 8 8      CBC w/Diff Recent Labs     06/22/18  0825 06/19/18  1206   WBC 7.4 10.4   RBC 3.44* 3.56*   HGB 9.2* 9.4*   HCT 29.9* 31.4*   PLT 276 252   GRANS 73.1*  --    LYMPH 17.9*  --    EOS 1.5  --       Cultures No results for input(s): CULT in the last 72 hours.  All Micro Results     None            Urinalysis Color   Date Value Ref Range Status   01/17/2018 Yellow   Final     Appearance   Date Value Ref Range Status   01/17/2018 Clear   Final     Comment:     Operator: [id number]     Specific gravity   Date Value Ref Range Status   01/17/2018 1.020 1.005 - 1.030   Final     pH (UA)   Date Value Ref Range Status   01/17/2018 5.5 5 - 9   Final     Protein   Date Value Ref Range Status   01/17/2018 Negative NEGATIVE,Negative mg/dl Final     Ketone   Date Value Ref Range Status   01/17/2018 Negative NEGATIVE,Negative mg/dl Final     Bilirubin   Date Value Ref Range Status   01/17/2018 Small (A) NEGATIVE,Negative   Final     Blood   Date Value Ref Range Status    01/17/2018 Negative NEGATIVE,Negative   Final     Urobilinogen   Date Value Ref Range Status   01/17/2018 0.2 0.0 - 1.0 EU/dl Final     Nitrites   Date Value Ref Range Status   01/17/2018 Negative NEGATIVE,Negative   Final     Leukocyte Esterase   Date Value Ref Range Status   01/17/2018 Negative NEGATIVE,Negative   Final     Potassium   Date Value Ref Range Status   06/22/2018 5.1 3.5 - 5.1 mEq/L Final     Creatinine   Date Value Ref Range Status   06/22/2018 3.7 (H) 0.6 - 1.3 mg/dl Final     BUN   Date Value Ref Range Status   06/22/2018 60 (H) 7 - 25 mg/dl Final     Prostate Specific Ag   Date Value Ref Range Status   04/30/2018 0.6 0.0 - 4.0 ng/mL Final      PSA No results for input(s): PSA in the last  72 hours.   Coagulation No results found for: PTP, INR, APTT, INREXT

## 2018-06-22 NOTE — ED Notes (Signed)
Pt remains in ultrasound. Will be transported to [place] when returned to ED

## 2018-06-22 NOTE — Progress Notes (Signed)
PT In 4228,Tommy Brown would like some sleeping pills. Please call nurse Stanton Kidney [phone]

## 2018-06-22 NOTE — H&P (Addendum)
Medicine History and Physical    Patient: Tommy Brown   Age:  73 y.o.    Assessment   Acute kidney injury, suspect due to obstructive uropathy, NSAIDs use  Left-sided hydronephrosis  Nephrolithiasis  Metabolic acidosis  Hyperkalemia  Hypertension  Coronary artery disease, history of CABG  Pancreatic cancer history  BPH  Hyperlipidemia    Plan   Admit to medical floor.  IV fluid hydration, avoid NSAIDs, avoid nephrotoxic medication  Urine output, monitor renal function  N.p.o., renal ultrasound showed left side hydronephrosis, awaiting CT abdomen and pelvis  Urology input appreciated.  Tylenol for pain control, tolerated well before.  Continue home anti  hypertensive medication  UA negative, urine culture in progress, monitor off antibiotics    Wife [name] called and updated [phone].    Chief Complaint:   Chief Complaint   Patient presents with   ??? Abnormal Lab Results     HPI:   Tommy Brown is a 73 y.o. year old male who presents with abdominal blood work..  Patient has history of renal stones and has been having intermittent flank pain in the past and uses Toradol.  Patient carries it with him.  Patient was to take 2-3 doses back-to-back when the pain is severe.  Patient was not taking any Toradol since Friday.  He is remaining pain-free since then.  Patient denies any nausea, vomiting, fever, chills.    Review of Systems -   Constitutional: Negative for fever, chills, diaphoresis  HENT: Negative for ear pain, congestion, sore throat  Eyes: Negative for photophobia,  visual disturbance.   Respiratory: negative for shortness of breath, cough  Cardiovascular: Negative for chest pain, palpitations and leg swelling.   Gastrointestinal: Negative for nausea, vomiting, abdominal pain  Genitourinary: Negative for dysuria, urgency, frequency  Musculoskeletal: Negative for back pain and arthralgias.   Skin: Negative for rash and wound.   Neurological: Negative for dizziness, seizures, syncope   Hematological: Does not bruise/bleed easily.     Past Medical History:    Past Medical History:   Diagnosis Date   ??? Arthritis    ??? Benign prostate hyperplasia    ??? Bladder infection    ??? Chronic kidney disease    ??? Coronary arteriosclerosis    ??? Coronary atherosclerosis of artery bypass graft    ??? Decreased testosterone level    ??? Diverticular disease    ??? Essential hypertension    ??? Gout    ??? History of acute renal failure    ??? History of anemia    ??? History of malignant neoplasm of pancreas    ??? Hx of CABG    ??? Kidney stone    ??? Kidney stones    ??? Neuropathy    ??? Pancreatic cancer ([place])    ??? Sepsis ([place])    ??? Tremor      Past Surgical History:  Past Surgical History:   Procedure Laterality Date   ??? HX ORTHOPAEDIC      back   ??? HX OTHER SURGICAL  2006    whipple procedure done for pancreatic cancer   ??? HX UROLOGICAL  2012    Percutaneous extraction of a kidney stone w/ fragmentation procedure      Family History:  Family History   Problem Relation Age of Onset   ??? Alcohol abuse Father    ??? Heart Disease Brother    ??? Emphysema Brother        Social History:  Social History     Socioeconomic History   ??? Marital status: MARRIED     Spouse name: Not on file   ??? Number of children: Not on file   ??? Years of education: Not on file   ??? Highest education level: Not on file   Tobacco Use   ??? Smoking status: Never Smoker   ??? Smokeless tobacco: Never Used   Substance and Sexual Activity   ??? Alcohol use: Not Currently   ??? Drug use: Not Currently   ??? Sexual activity: Not Currently   Social History Narrative    ** Merged History Encounter **          Home Medications:  Prior to Admission medications    Medication Sig Start Date End Date Taking? Authorizing Provider   ketorolac (TORADOL) 10 mg tablet Take 1 Tab by mouth every six (6) hours as needed for Pain. 06/04/18   Laurence Ferrari, NP   metoprolol tartrate (LOPRESSOR) 25 mg tablet Take 25 mg by mouth daily.    Other, Phys, MD    gabapentin (NEURONTIN) 600 mg tablet Take 600 mg by mouth two (2) times a day.    Other, Phys, MD   amLODIPine (NORVASC) 5 mg tablet Take 5 mg by mouth daily.    Other, Phys, MD   allopurinol (ZYLOPRIM) 300 mg tablet Take  by mouth daily.    Other, Phys, MD   tamsulosin (FLOMAX) 0.4 mg capsule Take 0.4 mg by mouth daily.    Other, Phys, MD   lipase-protease-amylase (ZENPEP) 20,000-63,000- 84,000 unit capsule Take 8 Caps by mouth daily.    Other, Phys, MD   tiZANidine (ZANAFLEX) 4 mg tablet Take 4 mg by mouth nightly.    Other, Phys, MD   fluticasone propionate 100 mcg/actuation dsdv Take  by inhalation.    Other, Phys, MD   aspirin 81 mg chewable tablet Take 81 mg by mouth daily.    Other, Phys, MD   TESTOSTERONE IM by IntraMUSCular route.    Other, Phys, MD       Allergies:  Allergies   Allergen Reactions   ??? Other Food Other (comments)     VISTAID  ANESTHESIA   ??? Codeine Itching and Swelling   ??? Oxycodone Anaphylaxis   ??? Hydroxyzine Pamoate Itching and Other (comments)     Other reaction(s): Unknown     ??? Meperidine Itching, Palpitations, Swelling and Other (comments)     Other reaction(s): Unknown     ??? Metoclopramide Itching, Other (comments) and Swelling     Other reaction(s): Other (See Comments)  Reaction unknown   Reaction unknown      ??? Morphine Other (comments) and Nausea and Vomiting   ??? Oxycodone-Acetaminophen Itching   ??? Prochlorperazine Itching, Other (comments) and Unknown (comments)     Other reaction(s): Unknown  Unsure of reaction     ??? Hydroxyzine Hcl Hives       Visit Vitals  BP 154/79 (BP 1 Location: Left arm, BP Patient Position: Supine)   Pulse 69   Temp 98.1 ??F (36.7 ??C)   Resp 16   Ht 5\' 8"  (1.727 m)   Wt 61.2 kg (135 lb)   SpO2 96%   BMI 20.53 kg/m??     PHYSICAL EXAM:   General:                    Alert, cooperative, no distress, appears stated age.     Head:  Normocephalic,atraumatic.  Eyes:                                   Pink conjunctiva, anicteric   Mouth:    Moist MM   Back:                                    No CVA tenderness.  Lungs:                       Clear to auscultation bilaterally.  No Wheezing or Rhonchi.  Heart:                                  S1, S2 heard , no murmur.  Abdomen:                  Soft, non-tender. Not distended.  Extremities:               No cyanosis.  No edema.  Skin:                           No rashes or lesions.  Psych:                        Not depressed.  Not anxious or agitated.  Neurologic:               No facial asymmetry. Normal  strength, Alert and oriented X 3.     Lab/Data Reviewed:  Recent Results (from the past 12 hour(s))   CBC WITH AUTOMATED DIFF    Collection Time: 06/22/18  8:25 AM   Result Value Ref Range    WBC 7.4 4.0 - 11.0 1000/mm3    RBC 3.44 (L) 3.80 - 5.70 M/uL    HGB 9.2 (L) 12.4 - 17.2 gm/dl    HCT 29.9 (L) 37.0 - 50.0 %    MCV 86.9 80.0 - 98.0 fL    MCH 26.7 23.0 - 34.6 pg    MCHC 30.8 30.0 - 36.0 gm/dl    PLATELET 276 140 - 450 1000/mm3    MPV 11.1 (H) 6.0 - 10.0 fL    RDW-SD 53.0 (H) 35.1 - 43.9      NRBC 0 0 - 0      IMMATURE GRANULOCYTES 0.4 0.0 - 3.0 %    NEUTROPHILS 73.1 (H) 34 - 64 %    LYMPHOCYTES 17.9 (L) 28 - 48 %    MONOCYTES 7.0 1 - 13 %    EOSINOPHILS 1.5 0 - 5 %    BASOPHILS 0.1 0 - 3 %   METABOLIC PANEL, BASIC    Collection Time: 06/22/18  8:25 AM   Result Value Ref Range    Sodium 140 136 - 145 mEq/L    Potassium 5.1 3.5 - 5.1 mEq/L    Chloride 113 (H) 98 - 107 mEq/L    CO2 19 (L) 21 - 32 mEq/L    Glucose 124 (H) 74 - 106 mg/dl    BUN 60 (H) 7 - 25 mg/dl    Creatinine 3.7 (H) 0.6 - 1.3 mg/dl    GFR est AA 21.0      GFR est non-AA 17  Calcium 9.2 8.5 - 10.1 mg/dl    Anion gap 8 5 - 15 mmol/L   URINALYSIS W/ RFLX MICROSCOPIC    Collection Time: 06/22/18  9:15 AM   Result Value Ref Range    Color YELLOW YELLOW,STRAW      Appearance CLEAR CLEAR      Glucose NEGATIVE NEGATIVE,Negative mg/dl    Bilirubin NEGATIVE NEGATIVE,Negative      Ketone NEGATIVE NEGATIVE,Negative mg/dl     Specific gravity 1.025 1.005 - 1.030      Blood NEGATIVE NEGATIVE,Negative      pH (UA) 5.5 5.0 - 9.0      Protein NEGATIVE NEGATIVE,Negative mg/dl    Urobilinogen 0.2 0.0 - 1.0 mg/dl    Nitrites NEGATIVE NEGATIVE,Negative      Leukocyte Esterase NEGATIVE NEGATIVE,Negative       [place] Retroperitoneum Comp    Result Date: 06/22/2018  IMPRESSION: 1.  Left hydronephrosis. 2.  Multiple bilateral nonobstructing renal calculi. 3.  Multiple bilateral simple cysts.     Total of 45  minutes of which more than 50% was spent in coordination of care and counseling (time spent with patient/family face to face, physical exam, reviewing laboratory and imaging investigations, speaking with staff involved in this patient's care).     DISPO - pt to be admitted  at this time for reasons addressed above, continued hospitalization for ongoing assessment and treatment indicated     Anticipated Date of Discharge: 2-3.  Anticipated Disposition (home, SNF) : home.  Dragon dictation software was used for portions of this report. Unintended errors in transcription may occur.  Edger House, MD  P# [phone]  June 22, 2018

## 2018-06-22 NOTE — Op Note (Signed)
[name]  Inpatient Operation Report  NAME:  Tommy Brown, Tommy Brown  SEX:   M  DATE: 06/22/2018  DOB: 1945-10-18  MR#    [id number]  ROOM:  7169  ACCT#  [id number]        PREPROCEDURE DIAGNOSIS:  Left obstructing ureteral calculi.    POSTPROCEDURE DIAGNOSIS:  Left obstructing ureteral calculi.    PROCEDURES PERFORMED:  1.  Cystoscopy.  2.  Left retrograde pyelogram with intraoperative interpretation.  3.  Left ureteral stent placement.    SURGEON:  Quinn Axe, MD    ASSISTANT:  None.    ANESTHESIA:  General.    FLUIDS:  Crystalloid.    ESTIMATED BLOOD LOSS:  Minimal.    SPECIMENS:  Bladder urine for culture.    DRAINS:  A 6-French x 24 cm ureteral stent, 16-French Foley catheter.    INDICATION FOR PROCEDURE:  The patient is a 73 year old male with a history of AKI and left obstructing ureteral calculus.  Risks, benefits, and alternatives were explained.  The patient decided to proceed.    INTRAOPERATIVE FINDINGS:  1.  On left retrograde pyelogram with intraoperative interpretation, the patient had a filling defect in the left proximal ureter.  Moderate hydronephrosis was noted.  No extravasation was noted.  2.  On cystourethroscopy, the patient had no bladder lesions.  Ureteral orifices were in orthotopic position.    DESCRIPTION OF PROCEDURE:   The patient first identified in the holding area and taken back to the operating room.  Perioperative antibiotics were given, Sequential compression device placed.  Anesthesia was induced.  The patient placed in dorsal lithotomy position, taking care to pad all pressure points.  The patient prepped and draped in usual sterile fashion.  Timeout was performed.    First, a 22-French rigid cystoscope was inserted into the patient's bladder.  Systematic cystoscopy was performed with 30 and 70-degree lenses, the findings as noted above.  Next, the left ureter was cannulated with a 0.038 Sensor wire up to level of renal pelvis.  Over the wire, a 5-French open-ended  catheter was advanced up to level of the renal pelvis.  Wire was removed and aspirate was obtained.  Next, a left retrograde pyelogram through the 5-French was performed.  Findings as noted above.  Wire was placed, open-ended catheter was removed in push-pull technique.  A 6-French x 24 cm Bard double pigtail ureteral stent was deployed.  Good proximal and distal curls were noted.  Scope was removed.  Foley catheter was placed.  The case came to a conclusion.      ___________________  Kathreen Cornfield MD  Dictated By:.   AC  D:06/22/2018 17:24:13  T: 06/22/2018 18:58:54  [id number]

## 2018-06-22 NOTE — H&P (Signed)
Medicine History and Physical    Patient: Tommy Brown   Age:  73 y.o.    Assessment   Acute kidney injury, suspect due to obstructive uropathy, NSAIDs use  Left-sided hydronephrosis  Nephrolithiasis  Metabolic acidosis  Hyperkalemia  Hypertension  Coronary artery disease, history of CABG  Pancreatic cancer history  BPH  Hyperlipidemia    Plan   Admit to medical floor.  IV fluid hydration, avoid NSAIDs, avoid nephrotoxic medication  Urine output, monitor renal function  N.p.o., renal ultrasound showed left side hydronephrosis, awaiting CT abdomen and pelvis  Urology input appreciated.  Tylenol for pain control, tolerated well before.  Continue home anti  hypertensive medication  UA negative, urine culture in progress, monitor off antibiotics    Wife [name] called and updated [phone].    Chief Complaint:   Chief Complaint   Patient presents with   ??? Abnormal Lab Results     HPI:   Tommy Brown is a 73 y.o. year old male who presents with abdominal blood work..  Patient has history of renal stones and has been having intermittent flank pain in the past and uses Toradol.  Patient carries it with him.  Patient was to take 2-3 doses back-to-back when the pain is severe.  Patient was not taking any Toradol since Friday.  He is remaining pain-free since then.  Patient denies any nausea, vomiting, fever, chills.    Review of Systems -   Constitutional: Negative for fever, chills, diaphoresis  HENT: Negative for ear pain, congestion, sore throat  Eyes: Negative for photophobia,  visual disturbance.   Respiratory: negative for shortness of breath, cough  Cardiovascular: Negative for chest pain, palpitations and leg swelling.   Gastrointestinal: Negative for nausea, vomiting, abdominal pain  Genitourinary: Negative for dysuria, urgency, frequency  Musculoskeletal: Negative for back pain and arthralgias.   Skin: Negative for rash and wound.   Neurological: Negative for dizziness, seizures, syncope  Hematological:  Does not bruise/bleed easily.     Past Medical History:    Past Medical History:   Diagnosis Date   ??? Arthritis    ??? Benign prostate hyperplasia    ??? Bladder infection    ??? Chronic kidney disease    ??? Coronary arteriosclerosis    ??? Coronary atherosclerosis of artery bypass graft    ??? Decreased testosterone level    ??? Diverticular disease    ??? Essential hypertension    ??? Gout    ??? History of acute renal failure    ??? History of anemia    ??? History of malignant neoplasm of pancreas    ??? Hx of CABG    ??? Kidney stone    ??? Kidney stones    ??? Neuropathy    ??? Pancreatic cancer ([place])    ??? Sepsis ([place])    ??? Tremor      Past Surgical History:  Past Surgical History:   Procedure Laterality Date   ??? HX ORTHOPAEDIC      back   ??? HX OTHER SURGICAL  2006    whipple procedure done for pancreatic cancer   ??? HX UROLOGICAL  2012    Percutaneous extraction of a kidney stone w/ fragmentation procedure      Family History:  Family History   Problem Relation Age of Onset   ??? Alcohol abuse Father    ??? Heart Disease Brother    ??? Emphysema Brother        Social History:  Social History     Socioeconomic History   ??? Marital status: MARRIED     Spouse name: Not on file   ??? Number of children: Not on file   ??? Years of education: Not on file   ??? Highest education level: Not on file   Tobacco Use   ??? Smoking status: Never Smoker   ??? Smokeless tobacco: Never Used   Substance and Sexual Activity   ??? Alcohol use: Not Currently   ??? Drug use: Not Currently   ??? Sexual activity: Not Currently   Social History Narrative    ** Merged History Encounter **          Home Medications:  Prior to Admission medications    Medication Sig Start Date End Date Taking? Authorizing Provider   ketorolac (TORADOL) 10 mg tablet Take 1 Tab by mouth every six (6) hours as needed for Pain. 06/04/18   Laurence Ferrari, NP   metoprolol tartrate (LOPRESSOR) 25 mg tablet Take 25 mg by mouth daily.    Other, Phys, MD   gabapentin (NEURONTIN) 600 mg tablet Take 600 mg by mouth  two (2) times a day.    Other, Phys, MD   amLODIPine (NORVASC) 5 mg tablet Take 5 mg by mouth daily.    Other, Phys, MD   allopurinol (ZYLOPRIM) 300 mg tablet Take  by mouth daily.    Other, Phys, MD   tamsulosin (FLOMAX) 0.4 mg capsule Take 0.4 mg by mouth daily.    Other, Phys, MD   lipase-protease-amylase (ZENPEP) 20,000-63,000- 84,000 unit capsule Take 8 Caps by mouth daily.    Other, Phys, MD   tiZANidine (ZANAFLEX) 4 mg tablet Take 4 mg by mouth nightly.    Other, Phys, MD   fluticasone propionate 100 mcg/actuation dsdv Take  by inhalation.    Other, Phys, MD   aspirin 81 mg chewable tablet Take 81 mg by mouth daily.    Other, Phys, MD   TESTOSTERONE IM by IntraMUSCular route.    Other, Phys, MD       Allergies:  Allergies   Allergen Reactions   ??? Other Food Other (comments)     VISTAID  ANESTHESIA   ??? Codeine Itching and Swelling   ??? Oxycodone Anaphylaxis   ??? Hydroxyzine Pamoate Itching and Other (comments)     Other reaction(s): Unknown     ??? Meperidine Itching, Palpitations, Swelling and Other (comments)     Other reaction(s): Unknown     ??? Metoclopramide Itching, Other (comments) and Swelling     Other reaction(s): Other (See Comments)  Reaction unknown   Reaction unknown      ??? Morphine Other (comments) and Nausea and Vomiting   ??? Oxycodone-Acetaminophen Itching   ??? Prochlorperazine Itching, Other (comments) and Unknown (comments)     Other reaction(s): Unknown  Unsure of reaction     ??? Hydroxyzine Hcl Hives       Visit Vitals  BP 154/79 (BP 1 Location: Left arm, BP Patient Position: Supine)   Pulse 69   Temp 98.1 ??F (36.7 ??C)   Resp 16   Ht 5\' 8"  (1.727 m)   Wt 61.2 kg (135 lb)   SpO2 96%   BMI 20.53 kg/m??     PHYSICAL EXAM:   General:                    Alert, cooperative, no distress, appears stated age.     Head:  Normocephalic,atraumatic.  Eyes:                                   Pink conjunctiva, anicteric  Mouth:    Moist MM   Back:                                     No CVA tenderness.  Lungs:                       Clear to auscultation bilaterally.  No Wheezing or Rhonchi.  Heart:                                  S1, S2 heard , no murmur.  Abdomen:                  Soft, non-tender. Not distended.  Extremities:               No cyanosis.  No edema.  Skin:                           No rashes or lesions.  Psych:                        Not depressed.  Not anxious or agitated.  Neurologic:               No facial asymmetry. Normal  strength, Alert and oriented X 3.     Lab/Data Reviewed:  Recent Results (from the past 12 hour(s))   CBC WITH AUTOMATED DIFF    Collection Time: 06/22/18  8:25 AM   Result Value Ref Range    WBC 7.4 4.0 - 11.0 1000/mm3    RBC 3.44 (L) 3.80 - 5.70 M/uL    HGB 9.2 (L) 12.4 - 17.2 gm/dl    HCT 29.9 (L) 37.0 - 50.0 %    MCV 86.9 80.0 - 98.0 fL    MCH 26.7 23.0 - 34.6 pg    MCHC 30.8 30.0 - 36.0 gm/dl    PLATELET 276 140 - 450 1000/mm3    MPV 11.1 (H) 6.0 - 10.0 fL    RDW-SD 53.0 (H) 35.1 - 43.9      NRBC 0 0 - 0      IMMATURE GRANULOCYTES 0.4 0.0 - 3.0 %    NEUTROPHILS 73.1 (H) 34 - 64 %    LYMPHOCYTES 17.9 (L) 28 - 48 %    MONOCYTES 7.0 1 - 13 %    EOSINOPHILS 1.5 0 - 5 %    BASOPHILS 0.1 0 - 3 %   METABOLIC PANEL, BASIC    Collection Time: 06/22/18  8:25 AM   Result Value Ref Range    Sodium 140 136 - 145 mEq/L    Potassium 5.1 3.5 - 5.1 mEq/L    Chloride 113 (H) 98 - 107 mEq/L    CO2 19 (L) 21 - 32 mEq/L    Glucose 124 (H) 74 - 106 mg/dl    BUN 60 (H) 7 - 25 mg/dl    Creatinine 3.7 (H) 0.6 - 1.3 mg/dl    GFR est AA 21.0      GFR est non-AA 17  Calcium 9.2 8.5 - 10.1 mg/dl    Anion gap 8 5 - 15 mmol/L   URINALYSIS W/ RFLX MICROSCOPIC    Collection Time: 06/22/18  9:15 AM   Result Value Ref Range    Color YELLOW YELLOW,STRAW      Appearance CLEAR CLEAR      Glucose NEGATIVE NEGATIVE,Negative mg/dl    Bilirubin NEGATIVE NEGATIVE,Negative      Ketone NEGATIVE NEGATIVE,Negative mg/dl    Specific gravity 1.025 1.005 - 1.030      Blood NEGATIVE  NEGATIVE,Negative      pH (UA) 5.5 5.0 - 9.0      Protein NEGATIVE NEGATIVE,Negative mg/dl    Urobilinogen 0.2 0.0 - 1.0 mg/dl    Nitrites NEGATIVE NEGATIVE,Negative      Leukocyte Esterase NEGATIVE NEGATIVE,Negative       [place] Retroperitoneum Comp    Result Date: 06/22/2018  IMPRESSION: 1.  Left hydronephrosis. 2.  Multiple bilateral nonobstructing renal calculi. 3.  Multiple bilateral simple cysts.     Total of 45  minutes of which more than 50% was spent in coordination of care and counseling (time spent with patient/family face to face, physical exam, reviewing laboratory and imaging investigations, speaking with staff involved in this patient's care).     DISPO - pt to be admitted  at this time for reasons addressed above, continued hospitalization for ongoing assessment and treatment indicated     Anticipated Date of Discharge: 2-3.  Anticipated Disposition (home, SNF) : home.  Dragon dictation software was used for portions of this report. Unintended errors in transcription may occur.  Edger House, MD  P# [phone]  June 22, 2018

## 2018-06-22 NOTE — Progress Notes (Signed)
PT In 4228,Tommy Brown would like some sleeping pills. Please call nurse Stanton Kidney [phone]

## 2018-06-22 NOTE — Interval H&P Note (Signed)
1501 omnipaque 30 ml on field for retrogrades.Liane Comber op. Francene Boyers

## 2018-06-22 NOTE — Anesthesia Post-Procedure Evaluation (Signed)
 Formatting of this note might be different from the original.    Procedure(s):  CYSTOSCOPY URETERAL STENT INSERTION OR REMOVAL.    general    Anesthesia Post Evaluation    Multimodal analgesia: multimodal analgesia used between 6 hours prior to anesthesia start to PACU discharge  Patient location during evaluation: PACU  Patient participation: complete - patient participated  Level of consciousness: awake  Pain management: satisfactory to patient  Airway patency: patent  Anesthetic complications: no  Cardiovascular status: stable  Respiratory status: acceptable  Hydration status: balanced  Post anesthesia nausea and vomiting:  none    No vitals data found for the desired time range.    Electronically signed by Theo Dills, MD at 06/22/2018  5:07 PM EDT

## 2018-06-22 NOTE — ED Notes (Signed)
Patient presents to the ED due to receiving a call from his urologist stating he needed to come to the ED right away due to abnormal lab results. Pt unsure what labs he was referencing. Only complaint patient has is that he has been feeling generally weak and unwell for 2 weeks. Denies fever or respiratory symptoms.

## 2018-06-22 NOTE — Consults (Signed)
Consults  by Kathreen Cornfield, MD at 06/22/18 [phone]                Author: Kathreen Cornfield, MD  Service: Urology  Author Type: Physician       Filed: 06/22/18 1552  Date of Service: 06/22/18 0934  Status: Addendum          Editor: Kathreen Cornfield, MD (Physician)          Related Notes: Original Note by Simonne Maffucci (Physician Assistant) filed at 06/22/18  1106                             No diagnosis found.      ASSESSMENT:    - AKI with baseline creatinine 1.5    - Peak creatinine 3.7 (4/27)    - Current creatinine 3.7    - Likely due to large Toradol in take    - Hx of nephrolithiasis s/p URS with LL in 02/2017   - Hx of ED with IPP   - Hx of Pancreatic cancer s/p Whipple in 2006, chemotherapy and radiation          PLAN:     - Will obtain renal ultrasound to assess for hydronephrosis    - If no hydronephrosis, will defer treatment to primary team.   - If hydronephrosis, will need CT abd/pelvis without contrast to evaluate for obs lesion    - UA with trace blood. Will order urine culture - primary team to treat accordingly    - Patient states he is taking Levoquin. Cannot find Urine culture results from recent. Antibiotics as per primary team    - NPO until after all imaging done          - RUS shows left hydronephrosis, will order CT abd/pelvis without contrast. Keep NPO      Follow up arranged? NO - will need follow up with Dr. Enos Fling, PA-C      [phone]      The history, physical and relevant labs/imaging were reviewed by Arletha Grippe, MD on 06/22/2018 and agree with the physician assistant's assessment  and plan.      Arletha Grippe MD   [facility]                        Chief Complaint       Patient presents with        ?  Abnormal Lab Results           HISTORY OF PRESENT ILLNESS:  Tommy Brown  is a 73 y.o. male who is seen  in consultation as referred by Dr. Forde Radon, MD for AKI with history of renal stones. Patient was seen by telemed  visit on Friday in our office. At that time, patient had passed stone, but was not feeling well. Labs were obtained showing hyperkalemia  (6.0) and AKI (creatinine 3.6). He was taking a large amount of Toradol prescribed by someone else. Today, Dr. Aurelio Jew called patient and advised to come into the ER for further evaluation. He has been feeling better and no flank pain. He states his pain  was sharp on left lower quadrant. He did pass a kidney stone and since then pain has been better.       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 Stone was 95%CaOx. 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            No flowsheet data found.           Past Medical History:        Diagnosis  Date         ?  Arthritis       ?  Benign prostate hyperplasia       ?  Bladder infection       ?  Chronic kidney disease       ?  Coronary arteriosclerosis       ?  Coronary atherosclerosis of artery bypass graft       ?  Decreased testosterone level       ?  Diverticular disease       ?  Essential hypertension       ?  Gout       ?  History of acute renal failure       ?  History of anemia       ?  History of malignant neoplasm of pancreas       ?  Hx of CABG       ?  Kidney stone       ?  Kidney stones       ?  Neuropathy       ?  Pancreatic cancer ([place])       ?  Sepsis ([place])           ?  Tremor               Past Surgical History:         Procedure  Laterality  Date          ?  HX ORTHOPAEDIC              back          ?  HX OTHER SURGICAL    2006          whipple procedure done for pancreatic cancer          ?  HX UROLOGICAL    2012          Percutaneous extraction of a kidney stone w/ fragmentation procedure              Social History          Tobacco Use         ?  Smoking status:  Never Smoker         ?  Smokeless tobacco:  Never Used       Substance Use Topics         ?  Alcohol use:  Not Currently         ?  Drug use:  Not Currently             Allergies         Allergen  Reactions         ?  Other Food  Other (comments)             VISTAID   ANESTHESIA         ?  Codeine  Itching and Swelling     ?  Oxycodone  Anaphylaxis     ?  Hydroxyzine Pamoate  Itching and Other (comments)  Other reaction(s): Unknown            ?  Meperidine  Itching, Palpitations, Swelling and Other (comments)             Other reaction(s): Unknown            ?  Metoclopramide  Itching, Other (comments) and Swelling             Other reaction(s): Other (See Comments)   Reaction unknown    Reaction unknown             ?  Morphine  Other (comments) and Nausea and Vomiting     ?  Oxycodone-Acetaminophen  Itching     ?  Prochlorperazine  Itching, Other (comments) and Unknown (comments)             Other reaction(s): Unknown   Unsure of reaction            ?  Hydroxyzine Hcl  Hives             Family History         Problem  Relation  Age of Onset          ?  Alcohol abuse  Father       ?  Heart Disease  Brother            ?  Emphysema  Brother               Current Outpatient Medications          Medication  Sig  Dispense  Refill           ?  ketorolac (TORADOL) 10 mg tablet  Take 1 Tab by mouth every six (6) hours as needed for Pain.  20 Tab  5     ?  metoprolol tartrate (LOPRESSOR) 25 mg tablet  Take 25 mg by mouth daily.         ?  gabapentin (NEURONTIN) 600 mg tablet  Take 600 mg by mouth two (2) times a day.         ?  amLODIPine (NORVASC) 5 mg tablet  Take 5 mg by mouth daily.         ?  allopurinol (ZYLOPRIM) 300 mg tablet  Take  by mouth daily.         ?  tamsulosin (FLOMAX) 0.4 mg capsule  Take 0.4 mg by mouth daily.               ?  lipase-protease-amylase (ZENPEP) 20,000-63,000- 84,000 unit capsule  Take 8 Caps by mouth daily.               ?  tiZANidine (ZANAFLEX) 4 mg tablet  Take 4 mg by mouth nightly.         ?  fluticasone propionate 100 mcg/actuation dsdv  Take  by inhalation.         ?  aspirin 81 mg chewable tablet  Take 81 mg by mouth daily.               ?  TESTOSTERONE IM   by IntraMUSCular route.               Review of Systems   ROS is:      Negative for: Ophthalmologic issues, ENT issues, Cardiovascular issues, respiratory issues, GI issues, neurologic issues, hematoogic issues, skin lesions, musculoskeletal issues, psychiatric issues   Exceptions: yes      Positive for:  Generalized weakness                    PHYSICAL EXAMINATION:    Visit Vitals      BP  129/76 (BP 1 Location: Left arm, BP Patient Position: At rest)     Pulse  88     Temp  98.3 ??F (36.8 ??C)     Resp  15     Ht  5\' 8"  (1.727 m)     Wt  61.2 kg (135 lb)     SpO2  99%        BMI  20.53 kg/m??        Constitutional: Well developed, well nourished male.  No acute distress.     HEENT: Normocephalic, Atraumatic, EOM's intact    CV:  no edema   Respiratory: No respiratory distress or difficulties breathing    Abdomen:  soft and non tender. Non-distended     GU Male:  No CVA tenderness   DRE: Deferred   Skin: No evidence of jaundice.  Normal color   Neuro/Psych:  Alert and oriented. Affect appropriate.    MSK: Normal ROM          REVIEW OF LABS AND IMAGING:           Labs:  Results:        Chemistry      Recent Labs         06/22/18   0825  06/19/18   1206      GLU  124*  113*      NA  140  137      K  5.1  6.0*      CL  113*  111*      CO2  19*  18*      BUN  60*  57*      CREA  3.7*  3.6*      CA  9.2  10.0      AGAP  8  8                 CBC w/Diff  Recent Labs         06/22/18   0825  06/19/18   1206      WBC  7.4  10.4      RBC  3.44*  3.56*      HGB  9.2*  9.4*      HCT  29.9*  31.4*      PLT  276  252      GRANS  73.1*   --       LYMPH  17.9*   --       EOS  1.5   --               Cultures  No results for input(s): CULT in the last 72 hours.   All Micro Results         None                          Urinalysis  Color      Date  Value  Ref Range  Status      01/17/2018  Yellow     Final            Appearance      Date  Value  Ref Range  Status      01/17/2018  Clear  Final          Comment:          Operator:  [phone]            Specific gravity      Date  Value  Ref Range  Status      01/17/2018  1.020  1.005 - 1.030    Final            pH (UA)      Date  Value  Ref Range  Status      01/17/2018  5.5  5 - 9    Final            Protein      Date  Value  Ref Range  Status      01/17/2018  Negative  NEGATIVE,Negative mg/dl  Final            Ketone      Date  Value  Ref Range  Status      01/17/2018  Negative  NEGATIVE,Negative mg/dl  Final            Bilirubin      Date  Value  Ref Range  Status      01/17/2018  Small (A)  NEGATIVE,Negative    Final            Blood      Date  Value  Ref Range  Status      01/17/2018  Negative  NEGATIVE,Negative    Final            Urobilinogen      Date  Value  Ref Range  Status      01/17/2018  0.2  0.0 - 1.0 EU/dl  Final            Nitrites      Date  Value  Ref Range  Status      01/17/2018  Negative  NEGATIVE,Negative    Final            Leukocyte Esterase      Date  Value  Ref Range  Status      01/17/2018  Negative  NEGATIVE,Negative    Final            Potassium      Date  Value  Ref Range  Status      06/22/2018  5.1  3.5 - 5.1 mEq/L  Final            Creatinine      Date  Value  Ref Range  Status      06/22/2018  3.7 (H)  0.6 - 1.3 mg/dl  Final            BUN      Date  Value  Ref Range  Status      06/22/2018  60 (H)  7 - 25 mg/dl  Final            Prostate Specific Ag      Date  Value  Ref Range  Status      04/30/2018  0.6  0.0 - 4.0 ng/mL  Final                 PSA  No results for input(s): PSA in the last 72 hours.        Coagulation  No results found for: PTP, INR, APTT, INREXT

## 2018-06-22 NOTE — Anesthesia Pre-Procedure Evaluation (Signed)
 Formatting of this note is different from the original.  Relevant Problems   No relevant active problems     Anesthetic History   No history of anesthetic complications        Review of Systems / Medical History  Patient summary reviewed and pertinent labs reviewed    Pulmonary  Within defined limits     Neuro/Psych     Neuromuscular disease    Comments: neuropathy Cardiovascular    Hypertension    CAD and CABG      GI/Hepatic/Renal      Renal disease: stones and CRI     Endo/Other    Arthritis and cancer     Other Findings   Comments: Pancreatic CA hx     Physical Exam    Airway  Mallampati: II    Neck ROM: normal range of motion   Mouth opening: Normal     Cardiovascular    Rhythm: regular  Rate: normal     Dental  No notable dental hx      Pulmonary  Breath sounds clear to auscultation     Abdominal  GI exam deferred     Other Findings       Anesthetic Plan    ASA: 2  Anesthesia type: general    Induction: Intravenous  Anesthetic plan and risks discussed with: Patient        Electronically signed by Theo Dills, MD at 06/22/2018  4:13 PM EDT

## 2018-06-22 NOTE — ED Provider Notes (Signed)
ED Provider Notes by Jari Sportsman, MD at 06/22/18 [phone]                Author: Jari Sportsman, MD  Service: EMERGENCY  Author Type: Resident       Filed: 06/22/18 0950  Date of Service: 06/22/18 0806  Status: Attested           Editor: Jari Sportsman, MD (Resident)  Cosigner: Lindajo Royal, MD at 06/22/18 1007          Attestation signed by Lindajo Royal, MD at 06/22/18 1007          I, Lindajo Royal, M.D., interviewed and examined the patient. I discussed with the resident and agree with the evaluation and plans as documented here.      Acute kidney injury.  Patient with history of kidney stones and has been taking ketorolac.  Noted to have abnormal outpatient labs with creatinine 3.6 and potassium 6 few days ago.  He is not having any pain.  Repeat labs today showed normal potassium  with creatinine 3.7.  Knee 1 month ago was 1.1.  Treated with IV fluids.  Will admit hospitalist.                                 [facility]   Emergency Department Treatment Report                Patient: Tommy Brown  Age: 73 y.o.  Sex: male          Date of Birth: 11-Jul-1945  Admit Date: 06/22/2018  PCP: Daralene Milch, MD     MRN: [id number]   CSN: [id number]            Room: ER30/ER30  Time Dictated: 8:06 AM          Chief Complaint      Chief Complaint       Patient presents with        ?  Abnormal Lab Results             History of Present Illness     73 y.o. male  with past medical history BPH, chronic kidney disease, nephrolithiasis, pancreatic cancer, hypertension, presenting to the emergency department after routine labs were found to show an AKI.  This is in the setting of the patient recently having a large  kidney stone diagnosed and being placed on ketorolac by mouth.  The patient was taking up to 4 tabs of ketorolac at a time to help with his pain.  His urologist drew labs 3 days ago which resulted today which showed a creatinine of 3 from his baseline  of 1.5.  The  patient endorses general malaise and fatigue at this time, otherwise has no complaints noted.  Denies fevers, chills, nausea, vomiting and states his pain is under good control at this point.        Review of Systems     Review of Systems    Constitutional: Positive for malaise/fatigue. Negative for diaphoresis.    HENT: Negative for nosebleeds.     Eyes: Negative for double vision.    Respiratory: Negative for hemoptysis.     Cardiovascular: Negative for claudication.    Gastrointestinal: Negative for vomiting.    Genitourinary: Negative for hematuria.    Musculoskeletal: Negative for joint pain.    Skin: Negative for rash.  Neurological: Positive for weakness. Negative for focal weakness.    Endo/Heme/Allergies: Does not bruise/bleed easily.    Psychiatric/Behavioral: Negative for hallucinations.            Past Medical/Surgical History          Past Medical History:        Diagnosis  Date         ?  Arthritis       ?  Benign prostate hyperplasia       ?  Bladder infection       ?  Chronic kidney disease       ?  Coronary arteriosclerosis       ?  Coronary atherosclerosis of artery bypass graft       ?  Decreased testosterone level       ?  Diverticular disease       ?  Essential hypertension       ?  Gout       ?  History of acute renal failure       ?  History of anemia       ?  History of malignant neoplasm of pancreas       ?  Hx of CABG       ?  Kidney stone       ?  Kidney stones       ?  Neuropathy       ?  Pancreatic cancer ([place])       ?  Sepsis ([place])           ?  Tremor            Past Surgical History:         Procedure  Laterality  Date          ?  HX ORTHOPAEDIC              back          ?  HX OTHER SURGICAL    2006          whipple procedure done for pancreatic cancer          ?  HX UROLOGICAL    2012          Percutaneous extraction of a kidney stone w/ fragmentation procedure              Social History          Social History          Socioeconomic History         ?  Marital status:  MARRIED               Spouse name:  Not on file         ?  Number of children:  Not on file     ?  Years of education:  Not on file     ?  Highest education level:  Not on file       Occupational History        ?  Not on file       Social Needs         ?  Financial resource strain:  Not on file        ?  Food insecurity              Worry:  Not on file         Inability:  Not on  file        ?  Transportation needs              Medical:  Not on file         Non-medical:  Not on file       Tobacco Use         ?  Smoking status:  Never Smoker     ?  Smokeless tobacco:  Never Used       Substance and Sexual Activity         ?  Alcohol use:  Not Currently     ?  Drug use:  Not Currently     ?  Sexual activity:  Not Currently       Lifestyle        ?  Physical activity              Days per week:  Not on file         Minutes per session:  Not on file         ?  Stress:  Not on file       Relationships        ?  Social Health visitor on phone:  Not on file         Gets together:  Not on file         Attends religious service:  Not on file         Active member of club or organization:  Not on file         Attends meetings of clubs or organizations:  Not on file         Relationship status:  Not on file        ?  Intimate partner violence              Fear of current or ex partner:  Not on file         Emotionally abused:  Not on file         Physically abused:  Not on file         Forced sexual activity:  Not on file        Other Topics  Concern        ?  Not on file       Social History Narrative          ** Merged History Encounter **                        Family History          Family History         Problem  Relation  Age of Onset          ?  Alcohol abuse  Father       ?  Heart Disease  Brother            ?  Emphysema  Brother               Current Medications          Cannot display prior to admission medications because the patient has not been admitted in this contact.             Allergies          Allergies         Allergen  Reactions         ?  Other Food  Other (comments)             VISTAID   ANESTHESIA         ?  Codeine  Itching and Swelling     ?  Oxycodone  Anaphylaxis     ?  Hydroxyzine Pamoate  Itching and Other (comments)             Other reaction(s): Unknown            ?  Meperidine  Itching, Palpitations, Swelling and Other (comments)             Other reaction(s): Unknown            ?  Metoclopramide  Itching, Other (comments) and Swelling             Other reaction(s): Other (See Comments)   Reaction unknown    Reaction unknown             ?  Morphine  Other (comments) and Nausea and Vomiting     ?  Oxycodone-Acetaminophen  Itching     ?  Prochlorperazine  Itching, Other (comments) and Unknown (comments)             Other reaction(s): Unknown   Unsure of reaction            ?  Hydroxyzine Hcl  Hives             Physical Exam          ED Triage Vitals     ED Encounter Vitals Group           BP          Pulse          Resp          Temp          Temp src          SpO2          Weight             Height             Physical Exam   Vitals signs and nursing note reviewed.   Constitutional:        Appearance: Normal appearance. He is not diaphoretic.   HENT :       Head: Normocephalic and atraumatic.      Mouth/Throat:      Mouth: Mucous membranes are moist.    Eyes:       General: No scleral icterus.     Pupils: Pupils are equal, round, and reactive to light.   Cardiovascular:       Rate and Rhythm: Normal rate and regular rhythm.      Heart sounds: Murmur present.   Pulmonary:       Effort: No respiratory distress.      Breath sounds: No stridor.   Abdominal:      General: There is no distension.      Palpations: Abdomen is  soft.      Tenderness: There is no abdominal tenderness. There is no right CVA tenderness or left CVA tenderness.     Musculoskeletal:          General: No deformity.      Right lower leg: No edema.      Left lower leg: No edema.    Skin:  Capillary Refill: Capillary refill takes less  than 2 seconds.      Findings: No rash.    Neurological:       General: No focal deficit present.      Mental Status: He is alert.    Psychiatric:         Mood and Affect: Mood and affect normal.             Impression and Management Plan     Well-appearing though tired 73 year old male with abnormal labs drawn 3 days ago.  Spoke with the patient's urologist; will obtain repeat laboratory evaluation today, provide hydration,  check a urinalysis, check an EKG, to determine if the AKI is worsening or improving.  Otherwise the patient has no complaints at this time; no pain, no difficulty urinating, no hematuria, no other issues noted.        Diagnostic Studies     Lab:      Recent Results (from the past 12 hour(s))     CBC WITH AUTOMATED DIFF          Collection Time: 06/22/18  8:25 AM         Result  Value  Ref Range            WBC  7.4  4.0 - 11.0 1000/mm3       RBC  3.44 (L)  3.80 - 5.70 M/uL       HGB  9.2 (L)  12.4 - 17.2 gm/dl       HCT  29.9 (L)  37.0 - 50.0 %       MCV  86.9  80.0 - 98.0 fL       MCH  26.7  23.0 - 34.6 pg       MCHC  30.8  30.0 - 36.0 gm/dl       PLATELET  276  140 - 450 1000/mm3       MPV  11.1 (H)  6.0 - 10.0 fL       RDW-SD  53.0 (H)  35.1 - 43.9         NRBC  0  0 - 0         IMMATURE GRANULOCYTES  0.4  0.0 - 3.0 %       NEUTROPHILS  73.1 (H)  34 - 64 %       LYMPHOCYTES  17.9 (L)  28 - 48 %       MONOCYTES  7.0  1 - 13 %       EOSINOPHILS  1.5  0 - 5 %       BASOPHILS  0.1  0 - 3 %       METABOLIC PANEL, BASIC          Collection Time: 06/22/18  8:25 AM         Result  Value  Ref Range            Sodium  140  136 - 145 mEq/L       Potassium  5.1  3.5 - 5.1 mEq/L       Chloride  113 (H)  98 - 107 mEq/L       CO2  19 (L)  21 - 32 mEq/L       Glucose  124 (H)  74 - 106 mg/dl       BUN  60 (H)  7 - 25 mg/dl       Creatinine  3.7 (H)  0.6 - 1.3 mg/dl       GFR est AA  21.0          GFR est non-AA  17          Calcium  9.2  8.5 - 10.1 mg/dl            Anion gap  8  5 - 15 mmol/L          Labs  Reviewed       CBC WITH AUTOMATED DIFF - Abnormal; Notable for the following components:            Result  Value            RBC  3.44 (*)         HGB  9.2 (*)         HCT  29.9 (*)         MPV  11.1 (*)         RDW-SD  53.0 (*)         NEUTROPHILS  73.1 (*)         LYMPHOCYTES  17.9 (*)            All other components within normal limits       METABOLIC PANEL, BASIC - Abnormal; Notable for the following components:            Chloride  113 (*)         CO2  19 (*)         Glucose  124 (*)         BUN  60 (*)         Creatinine  3.7 (*)            All other components within normal limits       CULTURE, URINE       URINALYSIS W/ RFLX MICROSCOPIC           Imaging:     No results found.      EKG here normal sinus rhythm at a rate of 74 bpm.  Left axis deviation.  Normal intervals.  Flattened T's in aVL.  Biphasic T wave in V2, otherwise no ST changes throughout.        ED Course/MDM        I reviewed the patients vitals signs which showed hemodynamic stability      I reviewed pts prior imaging        ED Course as of Jun 21 948       Mon Jun 22, 2018        0920  Improved from prior value of 6.0.  EKG today without evidence of T wave abnormalities.    Potassium: 5.1 [TG]     0920  Sending creatinine noted; up from baseline of 1.5 now to 3.7.    Creatinine(!): 3.7 [TG]     0921  Anemia at baseline    CBC WITH AUTOMATED DIFF(!):     WBC 7.4    RBC 3.44(!)    HGB 9.2(!)    HCT 29.9(!)    MCV 86.9    MCH 26.7    MCHC 30.8    PLATELET 276    MPV 11.1(!)    RDW-SD 53.0(!)    NRBC 0    IMMATURE GRANULOCYTES 0.4    NEUTROPHILS 73.1(!)    LYMPHOCYTES 17.9(!)    MONOCYTES 7.0    EOSINOPHILS 1.5  BASOPHILS 0.1 [TG]     0922  Will discuss admission with [facility] hospitalists given new renal injury.     [TG]     [id number]  Spoke to Urology PA Nira Conn who will consult. Spoke to [facility] hospitalists who will admit.     [TG]              ED Course User Index   [TG] Jari Sportsman, MD             Final Diagnosis                  ICD-10-CM  ICD-9-CM          1.  AKI (acute kidney injury) ([place])  N17.9  584.9          2.  Nephrolithiasis  N20.0  592.0             Disposition     Admit         The patient was personally evaluated by myself and Dr. Ysidro Evert who agrees with the above assessment and plan.      Fredia Sorrow, MD   PGY-4 EM   June 22, 2018      My signature above authenticates this document and my orders, the final     diagnosis (es), discharge prescription (s), and instructions in the Epic     record.   If you have any questions please contact [phone].       Nursing notes have been reviewed by the physician/ advanced practice     Clinician.      Dragon medical dictation software was used for portions of this report. Unintended voice recognition errors may occur.

## 2018-06-22 NOTE — ED Notes (Signed)
Pt remains in ultrasound. Will be transported to [facility] when returned to ED

## 2018-06-22 NOTE — Anesthesia Pre-Procedure Evaluation (Signed)
Relevant Problems   No relevant active problems       Anesthetic History   No history of anesthetic complications            Review of Systems / Medical History  Patient summary reviewed and pertinent labs reviewed    Pulmonary  Within defined limits                 Neuro/Psych         Neuromuscular disease    Comments: neuropathy Cardiovascular    Hypertension          CAD and CABG         GI/Hepatic/Renal         Renal disease: stones and CRI       Endo/Other        Arthritis and cancer     Other Findings   Comments: Pancreatic CA hx           Physical Exam    Airway  Mallampati: II    Neck ROM: normal range of motion   Mouth opening: Normal     Cardiovascular    Rhythm: regular  Rate: normal         Dental  No notable dental hx       Pulmonary  Breath sounds clear to auscultation               Abdominal  GI exam deferred       Other Findings            Anesthetic Plan    ASA: 2  Anesthesia type: general          Induction: Intravenous  Anesthetic plan and risks discussed with: Patient

## 2018-06-23 LAB — CBC WITH AUTOMATED DIFF
BASOPHILS: 0 % (ref 0–3)
EOSINOPHILS: 0 % (ref 0–5)
HCT: 28.2 % — ABNORMAL LOW (ref 37.0–50.0)
HGB: 8.6 gm/dl — ABNORMAL LOW (ref 12.4–17.2)
IMMATURE GRANULOCYTES: 0.6 % (ref 0.0–3.0)
LYMPHOCYTES: 11.2 % — ABNORMAL LOW (ref 28–48)
MCH: 26.4 pg (ref 23.0–34.6)
MCHC: 30.5 gm/dl (ref 30.0–36.0)
MCV: 86.5 fL (ref 80.0–98.0)
MONOCYTES: 3 % (ref 1–13)
MPV: 11 fL — ABNORMAL HIGH (ref 6.0–10.0)
NEUTROPHILS: 85.2 % — ABNORMAL HIGH (ref 34–64)
NRBC: 0 (ref 0–0)
PLATELET: 256 10*3/uL (ref 140–450)
RBC: 3.26 M/uL — ABNORMAL LOW (ref 3.80–5.70)
RDW-SD: 52.2 — ABNORMAL HIGH (ref 35.1–43.9)
WBC: 6.7 10*3/uL (ref 4.0–11.0)

## 2018-06-23 LAB — METABOLIC PANEL, BASIC
Anion gap: 5 mmol/L (ref 5–15)
BUN: 50 mg/dl — ABNORMAL HIGH (ref 7–25)
CO2: 21 mEq/L (ref 21–32)
Calcium: 9.4 mg/dl (ref 8.5–10.1)
Chloride: 115 mEq/L — ABNORMAL HIGH (ref 98–107)
Creatinine: 2.6 mg/dl — ABNORMAL HIGH (ref 0.6–1.3)
GFR est AA: 31
GFR est non-AA: 26
Glucose: 178 mg/dl — ABNORMAL HIGH (ref 74–106)
Potassium: 6.7 mEq/L — CR (ref 3.5–5.1)
Sodium: 141 mEq/L (ref 136–145)

## 2018-06-23 LAB — POTASSIUM
Potassium: 5.4 mEq/L — ABNORMAL HIGH (ref 3.5–5.1)
Potassium: 5.4 mEq/L — ABNORMAL HIGH (ref 3.5–5.1)

## 2018-06-23 LAB — GLUCOSE, POC
Glucose (POC): 224 mg/dL — ABNORMAL HIGH (ref 65–105)
Glucose (POC): 402 mg/dL — CR (ref 65–105)
Glucose (POC): 110 mg/dL — ABNORMAL HIGH (ref 65–105)

## 2018-06-23 LAB — CULTURE, URINE: Isolate: 30000 — AB

## 2018-06-23 LAB — CBC WITH AUTO DIFFERENTIAL
Basophils %: 0 % (ref 0–3)
Eosinophils %: 0 % (ref 0–5)
Hematocrit: 28.2 % — ABNORMAL LOW (ref 37.0–50.0)
Hemoglobin: 8.6 gm/dl — ABNORMAL LOW (ref 12.4–17.2)
Immature Granulocytes: 0.6 % (ref 0.0–3.0)
Lymphocytes %: 11.2 % — ABNORMAL LOW (ref 28–48)
MCH: 26.4 pg (ref 23.0–34.6)
MCHC: 30.5 gm/dl (ref 30.0–36.0)
MCV: 86.5 fL (ref 80.0–98.0)
MPV: 11 fL — ABNORMAL HIGH (ref 6.0–10.0)
Monocytes %: 3 % (ref 1–13)
Neutrophils %: 85.2 % — ABNORMAL HIGH (ref 34–64)
Nucleated RBCs: 0 (ref 0–0)
Platelets: 256 10*3/uL (ref 140–450)
RBC: 3.26 M/uL — ABNORMAL LOW (ref 3.80–5.70)
RDW-SD: 52.2 — ABNORMAL HIGH (ref 35.1–43.9)
WBC: 6.7 10*3/uL (ref 4.0–11.0)

## 2018-06-23 LAB — POCT GLUCOSE
POC Glucose: 224 mg/dL — ABNORMAL HIGH (ref 65–105)
POC Glucose: 402 mg/dL (ref 65–105)
POC Glucose: 110 mg/dL — ABNORMAL HIGH (ref 65–105)

## 2018-06-23 LAB — BASIC METABOLIC PANEL
Anion Gap: 5 mmol/L (ref 5–15)
BUN: 50 mg/dl — ABNORMAL HIGH (ref 7–25)
CO2: 21 mEq/L (ref 21–32)
Calcium: 9.4 mg/dl (ref 8.5–10.1)
Chloride: 115 mEq/L — ABNORMAL HIGH (ref 98–107)
Creatinine: 2.6 mg/dl — ABNORMAL HIGH (ref 0.6–1.3)
EGFR IF NonAfrican American: 26
GFR African American: 31
Glucose: 178 mg/dl — ABNORMAL HIGH (ref 74–106)
Potassium: 6.7 mEq/L (ref 3.5–5.1)
Sodium: 141 mEq/L (ref 136–145)

## 2018-06-23 MED ORDER — HEPARIN (PORCINE) 5,000 UNIT/ML IJ SOLN
5000 unit/mL | Freq: Two times a day (BID) | INTRAMUSCULAR | Status: DC
Start: 2018-06-23 — End: 2018-06-24
  Administered 2018-06-23 – 2018-06-24 (×3): via SUBCUTANEOUS

## 2018-06-23 MED ORDER — POLYVINYL ALCOHOL-POVIDONE 1.4 %-0.6 % EYE DROPPERETTE
Freq: Four times a day (QID) | OPHTHALMIC | Status: DC | PRN
Start: 2018-06-23 — End: 2018-06-26
  Administered 2018-06-23 – 2018-06-26 (×7): via OPHTHALMIC

## 2018-06-23 MED ORDER — CEFTRIAXONE 1 GRAM SOLUTION FOR INJECTION
1 gram | INTRAMUSCULAR | Status: DC
Start: 2018-06-23 — End: 2018-06-25
  Administered 2018-06-23 – 2018-06-25 (×3): via INTRAVENOUS

## 2018-06-23 MED ORDER — CALCIUM GLUCONATE 1 GRAM/50 ML IN SODIUM CHLORIDE, ISO-OSM IV SOLUTION
1 gram/50 mL | Freq: Once | INTRAVENOUS | Status: AC
Start: 2018-06-23 — End: 2018-06-23
  Administered 2018-06-23: 13:00:00 via INTRAVENOUS

## 2018-06-23 MED ORDER — INSULIN REGULAR HUMAN 100 UNIT/ML INJECTION
100 unit/mL | Freq: Once | INTRAMUSCULAR | Status: AC
Start: 2018-06-23 — End: 2018-06-23
  Administered 2018-06-23: 13:00:00 via INTRAVENOUS

## 2018-06-23 MED ORDER — PATIROMER CALCIUM SORBITEX 8.4 GRAM ORAL POWDER PACKET
8.4 gram | ORAL | Status: AC
Start: 2018-06-23 — End: 2018-06-23
  Administered 2018-06-23: 13:00:00 via ORAL

## 2018-06-23 MED ORDER — CALCIUM GLUCONATE 100 MG/ML (10%) IV SOLN
100 mg/mL (10%) | Freq: Once | INTRAVENOUS | Status: DC
Start: 2018-06-23 — End: 2018-06-23

## 2018-06-23 MED ORDER — MELATONIN 3 MG TAB
3 mg | Freq: Once | ORAL | Status: AC
Start: 2018-06-23 — End: 2018-06-22
  Administered 2018-06-23: 03:00:00 via ORAL

## 2018-06-23 MED ORDER — DEXTROSE 50% IN WATER (D50W) IV SYRG
Freq: Once | INTRAVENOUS | Status: AC
Start: 2018-06-23 — End: 2018-06-23
  Administered 2018-06-23: 13:00:00 via INTRAVENOUS

## 2018-06-23 MED FILL — CEFTRIAXONE 1 GRAM SOLUTION FOR INJECTION: 1 gram | INTRAMUSCULAR | Qty: 1

## 2018-06-23 MED FILL — ONDANSETRON (PF) 4 MG/2 ML INJECTION: 4 mg/2 mL | INTRAMUSCULAR | Qty: 2

## 2018-06-23 MED FILL — ACETAMINOPHEN 325 MG TABLET: 325 mg | ORAL | Qty: 2

## 2018-06-23 MED FILL — HEPARIN (PORCINE) 5,000 UNIT/ML IJ SOLN: 5000 unit/mL | INTRAMUSCULAR | Qty: 1

## 2018-06-23 MED FILL — POLYVINYL ALCOHOL-POVIDONE 1.4 %-0.6 % EYE DROPPERETTE: OPHTHALMIC | Qty: 1

## 2018-06-23 MED FILL — DEXTROSE 50% IN WATER (D50W) IV SYRG: INTRAVENOUS | Qty: 50

## 2018-06-23 MED FILL — VELTASSA 8.4 GRAM ORAL POWDER PACKET: 8.4 gram | ORAL | Qty: 1

## 2018-06-23 MED FILL — ASPIRIN 81 MG CHEWABLE TAB: 81 mg | ORAL | Qty: 1

## 2018-06-23 MED FILL — METOPROLOL SUCCINATE SR 25 MG 24 HR TAB: 25 mg | ORAL | Qty: 1

## 2018-06-23 MED FILL — CALCIUM GLUCONATE 1 GRAM/50 ML IN SODIUM CHLORIDE, ISO-OSM IV SOLUTION: 1 gram/50 mL | INTRAVENOUS | Qty: 50

## 2018-06-23 MED FILL — MELATONIN 3 MG TAB: 3 mg | ORAL | Qty: 1

## 2018-06-23 MED FILL — HUMULIN R REGULAR U-100 INSULIN 100 UNIT/ML INJECTION SOLUTION: 100 unit/mL | INTRAMUSCULAR | Qty: 0.1

## 2018-06-23 MED FILL — TAMSULOSIN SR 0.4 MG 24 HR CAP: 0.4 mg | ORAL | Qty: 1

## 2018-06-23 NOTE — Progress Notes (Signed)
Urology Progress Note        Assessment/Plan:     Patient Active Problem List   Diagnosis Code   ??? Sepsis ([place]) A41.9   ??? AKI (acute kidney injury) ([place]) N17.9     ASSESSMENT:   - Left 26mm proximal ureteral stone with hydroureteronephrosis    - s/p cystoscopy, left JJ stent on 06/22/2018 by Dr. Claiborne Billings   - AKI with baseline creatinine 1.5              - Peak creatinine 3.7 (4/27)              - Current creatinine 2.6              - Likely due in combination of large Toradol in take and obstructing stone   - Hyperkalemia - currently K is 6.7   - Hx of nephrolithiasis s/p URS with LL in 02/2017  - Hx of ED with IPP  - Hx of Pancreatic cancer s/p Whipple in 2006, chemotherapy and radiation   ??    Plan:    - Patient is aware JJ stent is temporary and will need removal or exchange in near future, no later than 3 months from now. Patient is aware will need definitive stone treatment outpatient in near future. He would like his wife to be able to be with him during procedure   - Urine culture 4/27 x2 - one showing 30k GNB, the other is still too early. Antibiotics as per primary team - levoquin was dced by primary team due to hyperkalemia, will add antibiotics soon, likely rocephin   - Maintain foley catheter until creatinine nadir - placed during OR procedure for renal drainage to help AKI   - Continue Flomax   - Will see tomorrow for possible voiding trail     Follow up arranged? NO - will need follow up with Dr. Enos Fling, PA-C    [phone]      Subjective:     Daily Progress Note: 06/23/2018 8:23 AM    Tommy Brown is doing well. He reports pain is absent. He has no new complaints. He is tolerating a solid diet. Indwelling catheter is draining well.    Objective:     Visit Vitals  BP 156/73 (BP 1 Location: Left arm, BP Patient Position: Supine)   Pulse 86   Temp 97.9 ??F (36.6 ??C)   Resp 16   Ht 5\' 8"  (1.727 m)   Wt 61.2 kg (135 lb)   SpO2 97%   BMI 20.53 kg/m??         Temp (24hrs), Avg:92.8 ??F (33.8 ??C), Min:36.7 ??F (2.6 ??C), Max:98.3 ??F (36.8 ??C)      Intake and Output:  04/26 1901 - 04/28 0700  In: 2151.7 [P.O.:120; I.V.:2031.7]  Out: 1900 [Urine:1900]  No intake/output data recorded.    PHYSICAL EXAMINATION:   Visit Vitals  BP 156/73 (BP 1 Location: Left arm, BP Patient Position: Supine)   Pulse 86   Temp 97.9 ??F (36.6 ??C)   Resp 16   Ht 5\' 8"  (1.727 m)   Wt 61.2 kg (135 lb)   SpO2 97%   BMI 20.53 kg/m??     Constitutional: Well developed, well nourished.  No acute distress.    HEENT: Normocephalic, Atraumatic, EOM's intact   Respiratory: No respiratory distress or difficulties breathing   GU Male:   Foley: draining well of yellow urine  Skin: No evidence of jaundice.  Normal color  Neuro/Psych:  Alert and oriented. Affect appropriate.     Lab/Data Review:  All lab results for the last 24 hours reviewed.     CT Results  (Last 48 hours)               06/22/18 1208  CT ABD PELV WO CONT Final result    Impression:  IMPRESSION:       1.  Obstructing 8 mm calculus in the left proximal ureter causing moderate left   hydronephrosis.   2.  Multiple additional nonobstructing bilateral renal calculi.   3.  Multiple bilateral renal cysts.   4.  Diverticulosis.   5.  Postoperative changes from prior Whipple procedure.   6.  Multiple hypoattenuating liver lesions, unchanged.           Narrative:  CT Abdomen and Pelvis without       Indication: Left hydronephrosis. Evaluate for obstructing stone or lesion.       Comparison: Ultrasound 06/22/2018. CT 05/21/2018.       TECHNIQUE:    CT of the abdomen and pelvis WITHOUT intravenous contrast. Coronal and sagittal   reformations were obtained.           All CT exams at this facility use one or more dose reduction techniques   including automatic exposure control, mA/kV adjustment per patient's size, or   iterative reconstruction technique. DICOM format imaged data is available to    non-affiliated external healthcare facilities or entities on a secure, media   free, reciprocal searchable basis with patient authorization for 12 months   following the date of the study.       DISCUSSION:       ABSENCE OF INTRAVENOUS CONTRAST DECREASES SENSITIVITY FOR DETECTION OF FOCAL   LESIONS AND VASCULAR PATHOLOGY.       LOWER THORAX: Coronary artery calcifications.       HEPATOBILIARY: Multiple subcentimeter foci of hypoattenuation, too small to   characterize but unchanged. Cholecystectomy. Left intrahepatic pneumobilia,   unchanged.   SPLEEN: No splenomegaly.   PANCREAS: Postoperative changes from prior Whipple procedure. No mass or duct   dilatation in the residual pancreatic tail.       ADRENALS: No adrenal nodules.   KIDNEYS/URETERS: Multiple bilateral renal cysts. Multiple small (2 to 4 mm)   nonobstructing bilateral renal calculi. There is a 10 x 9 mm nonobstructing   calculus in the lower pole of the left kidney. Moderate left hydronephrosis and   hydroureter. There is a 8 x 7 mm obstructing calculus in the proximal left   ureter. No right hydronephrosis. No suspicious renal lesions.   PELVIC ORGANS/BLADDER: Enlarged prostate. Penile implant. The bladder is   unremarkable.       PERITONEUM / RETROPERITONEUM: No free air or fluid.   LYMPH NODES: No lymphadenopathy.   VESSELS: Extensive atherosclerotic vascular calcifications. Infrarenal abdominal   aorta ectasia.       GI TRACT: No distention or wall thickening. Diverticulosis. Normal appendix.       BONES AND SOFT TISSUES: Posterior lumbar fixation at L4-5. Mild retrolisthesis   at L2-3 and L3-4. Moderate to advanced disc height loss at L2-3. Mild   spondylosis of the remaining lumbar spine.                   Labs:     Labs: Results:   Chemistry    Recent Labs     06/23/18  8546 06/22/18  0825   GLU 178* 124*   NA 141 140   K 6.7* 5.1   CL 115* 113*   CO2 21 19*   BUN 50* 60*   CREA 2.6* 3.7*   CA 9.4 9.2   AGAP 5 8      CBC w/Diff Recent Labs      06/23/18  0625 06/22/18  0825   WBC 6.7 7.4   RBC 3.26* 3.44*   HGB 8.6* 9.2*   HCT 28.2* 29.9*   PLT 256 276   GRANS 85.2* 73.1*   LYMPH 11.2* 17.9*   EOS 0.0 1.5      Cultures No results for input(s): CULT in the last 72 hours.  All Micro Results     Procedure Component Value Units Date/Time    CULTURE, URINE [[id number]]  (Abnormal) Collected:  06/22/18 0915    Order Status:  Completed Specimen:  Urine from Clean catch Updated:  06/23/18 0722     Isolate --        30,000 CFU/mL  Gram Negative Bacilli Isolated  Identification And Susceptibility To Follow      CULTURE, URINE [[id number]] Collected:  06/22/18 1643    Order Status:  Completed Specimen:  Urine from Bladder Updated:  06/22/18 1833            Urinalysis Color   Date Value Ref Range Status   06/22/2018 YELLOW YELLOW,STRAW   Final     Appearance   Date Value Ref Range Status   06/22/2018 CLEAR CLEAR   Final     Specific gravity   Date Value Ref Range Status   06/22/2018 1.025 1.005 - 1.030   Final     pH (UA)   Date Value Ref Range Status   06/22/2018 5.5 5.0 - 9.0   Final     Protein   Date Value Ref Range Status   06/22/2018 NEGATIVE NEGATIVE,Negative mg/dl Final     Ketone   Date Value Ref Range Status   06/22/2018 NEGATIVE NEGATIVE,Negative mg/dl Final     Bilirubin   Date Value Ref Range Status   06/22/2018 NEGATIVE NEGATIVE,Negative   Final     Blood   Date Value Ref Range Status   06/22/2018 NEGATIVE NEGATIVE,Negative   Final     Urobilinogen   Date Value Ref Range Status   06/22/2018 0.2 0.0 - 1.0 mg/dl Final     Nitrites   Date Value Ref Range Status   06/22/2018 NEGATIVE NEGATIVE,Negative   Final     Leukocyte Esterase   Date Value Ref Range Status   06/22/2018 NEGATIVE NEGATIVE,Negative   Final     Potassium   Date Value Ref Range Status   06/23/2018 6.7 (HH) 3.5 - 5.1 mEq/L Final     Comment:     THE FOLLOWING TEST HAS BEEN DETERMINED TO HAVE A CRITICAL VALUE.  THE  _K  RESULT WAS CALLED TO (AND READ BACK BY)  matrine_.              THE NOTIFICATION WAS MADE ON _04/28/20   BY amw_       Creatinine   Date Value Ref Range Status   06/23/2018 2.6 (H) 0.6 - 1.3 mg/dl Final     BUN   Date Value Ref Range Status   06/23/2018 50 (H) 7 - 25 mg/dl Final     Prostate Specific Ag   Date Value Ref Range Status   04/30/2018 0.6 0.0 -  4.0 ng/mL Final      PSA No results for input(s): PSA in the last 72 hours.   Coagulation No results found for: PTP, INR, APTT, INREXT

## 2018-06-23 NOTE — Progress Notes (Signed)
Attempted CMA. Pt states he prefers me to speak to spouse. I called and left  VM

## 2018-06-23 NOTE — Progress Notes (Signed)
PAGER ID: [phone] Dr. Robie Ridge  MESSAGE: [phone] has a potassium of 6.7 this morning. [place], [place]

## 2018-06-23 NOTE — Progress Notes (Signed)
Problem: Falls - Risk of  Goal: *Absence of Falls  Description: Document Schmid Fall Risk and appropriate interventions in the flowsheet.  Outcome: Progressing Towards Goal  Note: Fall Risk Interventions:            Medication Interventions: Bed/chair exit alarm, Patient to call before getting OOB, Teach patient to arise slowly

## 2018-06-23 NOTE — Other (Signed)
Pt arrived via stretcher with [facility] nurse. Alert and oriented x4. Tele box 87 verified with tele tech, NSR. Call bell in reach, bed low and locked, bed alarm on.

## 2018-06-23 NOTE — Progress Notes (Signed)
Problem: Falls - Risk of  Goal: *Absence of Falls  Description: Document Schmid Fall Risk and appropriate interventions in the flowsheet.  Outcome: Progressing Towards Goal  Note: Fall Risk Interventions:            Medication Interventions: Bed/chair exit alarm, Patient to call before getting OOB                   Problem: Patient Education: Go to Patient Education Activity  Goal: Patient/Family Education  Outcome: Progressing Towards Goal

## 2018-06-23 NOTE — Progress Notes (Signed)
Hospitalist Progress Note    Patient: Tommy Brown               Sex: male          DOA: 06/22/2018       Date of Birth:  Aug 13, 1945      Age:  73 y.o.        LOS:  LOS: 1 day     PCP: Daralene Milch, MD   Assessment and Plan:   Assessment  Acute hyperkalemia  Acute kidney injury, suspect due to obstructive uropathy, NSAIDs use  Left-sided hydronephrosis due to obstructing stone s/p left J stent on 06/22/2018  Nephrolithiasis  Metabolic acidosis, resolved.  Hypertension  Coronary artery disease, history of CABG  Pancreatic cancer history  BPH  Hyperlipidemia    Plan  STAT repeat K  Ordered insulin/dextrose  1 dose calcium gluconate  Transfer to tele monitoring  [place] one dose  Nephrology consult  Monitor UOP, Foley in place  Creatinine improving  S/p left ureteral stent for left side obstructing stone and hydro on CT  IV hydration.  Urine culture GNB, add IV rocephin  Hold Gabapentin  Low K diet.  DVT ppx       Subjective:  Resting in bed, denies flank pain, SOB, chest pain      Tommy Brown is a 73 y.o. year old male who is being seen for  Acute hyperkalemia.    Objective:      Vital Signs:  Visit Vitals  BP 156/73 (BP 1 Location: Left arm, BP Patient Position: Supine)   Pulse 86   Temp 97.9 ??F (36.6 ??C)   Resp 16   Ht 5\' 8"  (1.727 m)   Wt 61.2 kg (135 lb)   SpO2 97%   BMI 20.53 kg/m??     Physical Exam:  GENERAL: Alert and oriented times 3.  HEAD: Head normocephalic, atraumatic   Eyes: Conjunctivae pink, anicteric  CARDIOVASCULAR: S1 and S2 heard, no murmur  RESPIRATORY: Effort normal. no crackles. no ronchi  ABDOMEN: Soft, nontender, nondistended. Normal bowel present.   NEUROLOGIC: Alert and oriented times 3. Able to move all 4 extremities.   PSYCHIATRIC: Normal affect.    Intake and Output:  Last three shifts:  04/26 1901 - 04/28 0700  In: 2151.7 [P.O.:120; I.V.:2031.7]  Out: 1900 [Urine:1900]    Lab Results:  Recent Results (from the past 24 hour(s))   URINALYSIS W/ RFLX MICROSCOPIC     Collection Time: 06/22/18  9:15 AM   Result Value Ref Range    Color YELLOW YELLOW,STRAW      Appearance CLEAR CLEAR      Glucose NEGATIVE NEGATIVE,Negative mg/dl    Bilirubin NEGATIVE NEGATIVE,Negative      Ketone NEGATIVE NEGATIVE,Negative mg/dl    Specific gravity 1.025 1.005 - 1.030      Blood NEGATIVE NEGATIVE,Negative      pH (UA) 5.5 5.0 - 9.0      Protein NEGATIVE NEGATIVE,Negative mg/dl    Urobilinogen 0.2 0.0 - 1.0 mg/dl    Nitrites NEGATIVE NEGATIVE,Negative      Leukocyte Esterase NEGATIVE NEGATIVE,Negative     CULTURE, URINE    Collection Time: 06/22/18  9:15 AM   Result Value Ref Range    Isolate (A)       30,000 CFU/mL  Gram Negative Bacilli Isolated  Identification And Susceptibility To Follow     METABOLIC PANEL, BASIC    Collection Time: 06/23/18  6:25 AM  Result Value Ref Range    Sodium 141 136 - 145 mEq/L    Potassium 6.7 (HH) 3.5 - 5.1 mEq/L    Chloride 115 (H) 98 - 107 mEq/L    CO2 21 21 - 32 mEq/L    Glucose 178 (H) 74 - 106 mg/dl    BUN 50 (H) 7 - 25 mg/dl    Creatinine 2.6 (H) 0.6 - 1.3 mg/dl    GFR est AA 31.0      GFR est non-AA 26      Calcium 9.4 8.5 - 10.1 mg/dl    Anion gap 5 5 - 15 mmol/L   CBC WITH AUTOMATED DIFF    Collection Time: 06/23/18  6:25 AM   Result Value Ref Range    WBC 6.7 4.0 - 11.0 1000/mm3    RBC 3.26 (L) 3.80 - 5.70 M/uL    HGB 8.6 (L) 12.4 - 17.2 gm/dl    HCT 28.2 (L) 37.0 - 50.0 %    MCV 86.5 80.0 - 98.0 fL    MCH 26.4 23.0 - 34.6 pg    MCHC 30.5 30.0 - 36.0 gm/dl    PLATELET 256 140 - 450 1000/mm3    MPV 11.0 (H) 6.0 - 10.0 fL    RDW-SD 52.2 (H) 35.1 - 43.9      NRBC 0 0 - 0      IMMATURE GRANULOCYTES 0.6 0.0 - 3.0 %    NEUTROPHILS 85.2 (H) 34 - 64 %    LYMPHOCYTES 11.2 (L) 28 - 48 %    MONOCYTES 3.0 1 - 13 %    EOSINOPHILS 0.0 0 - 5 %    BASOPHILS 0.0 0 - 3 %     Images:  Ct Abd Pelv Wo Cont    Result Date: 06/22/2018   CT Abdomen and Pelvis without Indication: Left hydronephrosis. Evaluate for obstructing stone or lesion. Comparison: Ultrasound 06/22/2018. CT 05/21/2018. TECHNIQUE: CT of the abdomen and pelvis WITHOUT intravenous contrast. Coronal and sagittal reformations were obtained.    All CT exams at this facility use one or more dose reduction techniques including automatic exposure control, mA/kV adjustment per patient's size, or iterative reconstruction technique. DICOM format imaged data is available to non-affiliated external healthcare facilities or entities on a secure, media free, reciprocal searchable basis with patient authorization for 12 months following the date of the study. DISCUSSION: ABSENCE OF INTRAVENOUS CONTRAST DECREASES SENSITIVITY FOR DETECTION OF FOCAL LESIONS AND VASCULAR PATHOLOGY. LOWER THORAX: Coronary artery calcifications. HEPATOBILIARY: Multiple subcentimeter foci of hypoattenuation, too small to characterize but unchanged. Cholecystectomy. Left intrahepatic pneumobilia, unchanged. SPLEEN: No splenomegaly. PANCREAS: Postoperative changes from prior Whipple procedure. No mass or duct dilatation in the residual pancreatic tail. ADRENALS: No adrenal nodules. KIDNEYS/URETERS: Multiple bilateral renal cysts. Multiple small (2 to 4 mm) nonobstructing bilateral renal calculi. There is a 10 x 9 mm nonobstructing calculus in the lower pole of the left kidney. Moderate left hydronephrosis and hydroureter. There is a 8 x 7 mm obstructing calculus in the proximal left ureter. No right hydronephrosis. No suspicious renal lesions. PELVIC ORGANS/BLADDER: Enlarged prostate. Penile implant. The bladder is unremarkable. PERITONEUM / RETROPERITONEUM: No free air or fluid. LYMPH NODES: No lymphadenopathy. VESSELS: Extensive atherosclerotic vascular calcifications. Infrarenal abdominal aorta ectasia. GI TRACT: No distention or wall thickening. Diverticulosis. Normal appendix. BONES AND SOFT TISSUES: Posterior lumbar  fixation at L4-5. Mild retrolisthesis at L2-3 and L3-4. Moderate to advanced disc height loss at L2-3. Mild spondylosis of the remaining lumbar spine.  IMPRESSION: 1.  Obstructing 8 mm calculus in the left proximal ureter causing moderate left hydronephrosis. 2.  Multiple additional nonobstructing bilateral renal calculi. 3.  Multiple bilateral renal cysts. 4.  Diverticulosis. 5.  Postoperative changes from prior Whipple procedure. 6.  Multiple hypoattenuating liver lesions, unchanged.     [place] Retroperitoneum Comp    Result Date: 06/22/2018  INDICATION: AKI with history of renal stones;    COMPARISON: CT 05/21/2018 and MRI 01/19/2018. TECHNIQUE: Renal Ultrasound FINDINGS: Right Kidney: Measures 9.6 x 4.3 x 6.4 cm. There is good corticomedullary differentiation. No hydronephrosis. Multiple echogenic foci in the renal pelvis, consistent with calculi, the largest measuring 0.4 cm. Multiple simple cysts, the largest cyst in the mid kidney measuring 0.4 x 0.5 x 0.4 cm. No solid mass. Left Kidney: Measures 11.0 x 4.4 x 6.6 cm. There is good corticomedullary differentiation. Moderate hydronephrosis and proximal hydroureter. Multiple echogenic foci in the renal pelvis, consistent with calculi, the largest measuring 0.6 cm. Multiple simple cysts, the largest cyst in the mid kidney measuring 1.8 x 1.7 x 1.9 cm. No solid mass. Bladder: Unremarkable.     IMPRESSION: 1.  Left hydronephrosis. 2.  Multiple bilateral nonobstructing renal calculi. 3.  Multiple bilateral simple cysts.     Xr Retro Urethrocystography    Result Date: 06/22/2018  INDICATION: L retrograde / L stent placement     PROCEDURE: XR RETRO URETHROCYSTOGRAPHY COMPARISON: None FLUOROSCOPIC TIME: 7.5 seconds. FINDINGS: Left retrograde, left stent placement.     IMPRESSION: Left retrograde, left stent placement.     Medications:  Current Facility-Administered Medications   Medication Dose Route Frequency    ??? polyvinyl alcohol-povidon(PF) (REFRESH CLASSIC) 1.4-0.6 % ophthalmic solution 2 Drop  2 Drop Both Eyes Q6H PRN   ??? insulin regular (NOVOLIN R, HUMULIN R) injection 10 Units  10 Units IntraVENous ONCE   ??? dextrose (D50W) injection syrg 25 g  25 g IntraVENous ONCE   ??? patiromer calcium sorbitex (VELTASSA) powder 8.4 g  8.4 g Oral NOW   ??? calcium gluconate 1 gram in sodium chloride (ISO-OSM) 50 mL infusion  1 g IntraVENous ONCE   ??? heparin (porcine) injection 5,000 Units  5,000 Units SubCUTAneous Q12H   ??? cefTRIAXone (ROCEPHIN) 1 g in 0.9% sodium chloride (MBP/ADV) 50 mL MBP  1 g IntraVENous Q24H   ??? 0.9% sodium chloride infusion  100 mL/hr IntraVENous CONTINUOUS   ??? [Held by provider] amLODIPine (NORVASC) tablet 5 mg  5 mg Oral DAILY   ??? [Held by provider] aspirin chewable tablet 81 mg  81 mg Oral DAILY   ??? metoprolol succinate (TOPROL-XL) XL tablet 25 mg  25 mg Oral DAILY   ??? tamsulosin (FLOMAX) capsule 0.4 mg  0.4 mg Oral DAILY   ??? naloxone (NARCAN) injection 0.1 mg  0.1 mg IntraVENous PRN   ??? acetaminophen (TYLENOL) tablet 650 mg  650 mg Oral Q4H PRN    Or   ??? acetaminophen (TYLENOL) solution 650 mg  650 mg Oral Q4H PRN    Or   ??? acetaminophen (TYLENOL) suppository 650 mg  650 mg Rectal Q4H PRN   ??? ondansetron (ZOFRAN) injection 4 mg  4 mg IntraVENous Q4H PRN   ??? polyethylene glycol (MIRALAX) packet 17 g  17 g Oral DAILY PRN   total time spent was 36 minutes   Dragon dictation software was used for portions of this report. Unintended errors in transcription may occur.  [place], MD  June 23, 2018  8:37 AM

## 2018-06-23 NOTE — Progress Notes (Signed)
Patient in room [place] is asking for a sleeping pill. He had one time dose of Melatonin 3mg .Could you prescribe a sleeping pill for him. Ronal Fear RN  [phone]

## 2018-06-23 NOTE — Progress Notes (Signed)
Patient admitted on 06/22/2018 from ED from home with   Chief Complaint   Patient presents with   ??? Abnormal Lab Results          The patient is being treated for    PMH:   Past Medical History:   Diagnosis Date   ??? Arthritis    ??? Benign prostate hyperplasia    ??? Bladder infection    ??? Chronic kidney disease    ??? Coronary arteriosclerosis    ??? Coronary atherosclerosis of artery bypass graft    ??? Decreased testosterone level    ??? Diverticular disease    ??? Essential hypertension    ??? Gout    ??? History of acute renal failure    ??? History of anemia    ??? History of malignant neoplasm of pancreas    ??? Hx of CABG    ??? Kidney stone    ??? Kidney stones    ??? Neuropathy    ??? Pancreatic cancer ([place])    ??? Sepsis ([place])    ??? Tremor         Treatment Team: Treatment Team: Attending Provider: Edger House, MD; Consulting Provider: Edger House, MD; Consulting Provider: Earnest Bailey, MD; Care Manager: Ralph Dowdy; Consulting Provider: Horace Porteous, MD      The patient has been admitted to the hospital 1 times in the past 12 months.    Previous 4 Admission Dates Admission and Discharge Diagnosis Interventions Barriers Disposition                                 Patient and Family/Caregivers Goals of Care: tx aki    Caregivers Participating in Plan of Care/Discharge Plan with the patient: spouse    Tentative dc plan: home    Anticipated DME needs for discharge: na    PRESCREENING COMPLETED FOR SNF na      Does the patient have appropriate clothing available to be worn at discharge? yes      The patient and care participants are willing to travel na area for discharge facility.  The patient and plan of care participants have been provided with a list of all available Rehab Facilities or [place] agencies as applicable. CM will follow up with a list of facilities or agencies that are offer acceptance.    CM has disclosed any financial interest that [facility] may have with any facility or agency.     Anticipated Discharge Date: 4/30    The plan of care and discharge plan has been discussed with Edger House, * and all other appropriate providers and adjusted per interdisciplinary team recommendations and in discussion with the patient and the patient designated Care Plan Participants.    Barriers to Healthcare Success/ Readmission Risk Factors: Htn, CAD hx of CABG, BPH.    Consults:  Palliative Care Consult Recommended: n  Transitional Care Clinic Referral: n  Transitional Nurse Navigator Referral: n  Oncology Navigator Referral: n  SW consulted: n  [facility] Consulted: n  Outside Hospital/Community Resources Referrals and Collaboration: n    Food/Nutrition Needs:   n                   Dietician Consulted: n    RRAT Score: Low Risk            12       Total Score  3 Has Seen PCP in Last 6 Months (Yes=3, No=0)    4 IP Visits Last 12 Months (1-3=4, 4=9, >4=11)    5 Pt. Coverage (Medicare=5 , Medicaid, or Self-Pay=4)        Criteria that do not apply:    Married. Living with Significant Other. Assisted Living. LTAC. SNF. or   Rehab    Patient Length of Stay (>5 days = 3)    Charlson Comorbidity Score (Age + Comorbid Conditions)           PCP: Daralene Milch, MD . How do you get to your doctor appointments?self    Specialists:      Dialysis Unit: n    Pharmacy: [facility]. Are there any medications that you have trouble paying for? Any difficulty getting your medications?N    DME available at [place]:  na            Home O2 Provider: na    Home Environment and Prior Level of Function: Lives at [address]  [place] [address] @HOMEPHONE @. Lives with spouse. 1 story. 2 Steps into home.  Responsibilities at home include adls    Prior to admission open services: [place]-    Extended Emergency Contact Information  Primary Emergency Contact: [place]   Mobile Phone: [phone]  Relation: None     Transportation: spouse will transport home    Therapy Recommendations:    OT = n    PT = n    SLP =  n     RT Home O2 Evaluation =  n    Wound Care =  n    Case Management Assessment    ABUSE/NEGLECT SCREENING   Physical Abuse/Neglect: Denies   Sexual Abuse: Denies   Sexual Abuse: Denies   Other Abuse/Issues: Denies          PRIMARY DECISION MAKER                                   CARE MANAGEMENT INTERVENTIONS   Readmission Interview Completed: Not Applicable   PCP Verified by CM: Yes(deguz[place])           Mode of Transport at Discharge: Other (see comment)(wife)       Transition of Care Consult (CM Consult): Discharge Planning           MyChart Signup: No   Discharge Durable Medical Equipment: No   Physical Therapy Consult: No   Occupational Therapy Consult: No   Speech Therapy Consult: No   Current Support Network: Lives with Spouse   Reason for Referral: DCP Rounds   History Provided By: Patient, Spouse, Medical Record   Patient Orientation: Alert and Oriented, Place, Person, Situation, Self   Cognition: Alert   Support System Response: Concerned, Cooperative   Previous Living Arrangement: Lives with Family Independent   Home Accessibility: Steps   Prior Functional Level: Independent in ADLs/IADLs   Current Functional Level: Independent in ADLs/IADLs       Can patient return to prior living arrangement: Yes   Ability to make needs known:: Good   Family able to assist with home care needs:: Yes               Types of Needs Identified: Treatment Education       Confirm  Follow Up Transport: Family                  DISCHARGE LOCATION   Discharge Placement: Home

## 2018-06-23 NOTE — Consults (Signed)
Renal Consult     Patient: Tommy Brown Age: 73 y.o. Sex: male    Date of Birth: July 27, 1945 Admit Date: 06/22/2018 PCP: Daralene Milch, MD   MRN: [id number]  CSN: [id number]       Chief Complaint:  Chief Complaint   Patient presents with   ??? Abnormal Lab Results       Assessment:   1. Acute kidney injury.  This is very likely due to obstructive uropathy, hypovolemia as well as Toradol use as an outpatient.  This is getting better with good urine output.  2. Hyperkalemia.  This is a little harder to explain.  In general, when patients have recovering renal function with good urine output hyperkalemia is rarely a problem particularly when his potassium is normal yesterday.  The patient states that he did not have a hard time drawing his blood this morning but we do need to consider that this could have been a laboratory error in some way shape or form.  He has been managed medically quite appropriately.  3. Postop day 1 status post left ureteral stenting due to obstructing stone.      Recommendations:   1. I agree with medical management as has been done already.  2. I agree with transfer to telemetry.  3. Repeat BMP is already pending.  If the potassium is still high, I will likely increase his rate of IV fluid and dose with Lasix once to increase potassium output into his urine.  4. I have ordered labs for tomorrow morning.  5. The patient is a very complicated medical history but he made one thing clear to me.  He never wants dialysis.  Fortunately, I do not think this will ever become a realistic possibility.  6. I discussed this at length with the patient's wife over the phone.    Patient Active Problem List   Diagnosis Code   ??? Sepsis ([place]) A41.9   ??? AKI (acute kidney injury) ([place]) N17.9       Problem list:   1. Acute kidney injury  2. Hyperkalemia  3. Multiple episodes of nephrolithiasis requiring stenting and laser treatment previously  4. Postoperative day 1 status post left double-J stent   5. Coronary artery disease status post CABG in 1996  6. Pancreatic cancer status post Whipple procedure in 2012 done at [facility] in [facility]  7. Anemia    History of Present Illness    I was asked by Dr. Robie Ridge to see this patient in consult. Tommy Brown is a 73 y.o. year old male [place] who presents with acute kidney injury.  The patient was being managed as an outpatient for nephrolithiasis and was taking Toradol at home up to 4 pills at a time for left renal colic.  He was sent to the emergency room a few days ago due to acute kidney injury found on lab work.  He underwent double-J stenting yesterday as noted in the history section.  He currently feels a lot better and has no acute complaints.  Consult is for hyperkalemia.    Review of Systems   Constitutional: Negative for fever and chills.   Skin: Negative for rash or skin lesions.   Eyes: Sclera clear  Cardiovascular:  Negative for chest pain,exertional dyspnea and palpitations.   Respiratory: Negative for cough and hemoptysis,shortness of breath,orthopnea or pnd.   Gastrointestinal: Negative for nausea and vomitting. Negative for abdominal pain diarrhea or constipation or melena.  Genitourinary: Negative for dysuria,blood in the urine,frequency or burning on micturition. No difficulty with initiating urination.  Musculoskeletal: Negative  for arthralgias,back pain or neck pain.  Neurological: Negative for dizziness or syncopal episodes   Psychiatric: Negative.     Past Medical/Psychiatric/Surgical History     Past Medical History:   Diagnosis Date   ??? Arthritis    ??? Benign prostate hyperplasia    ??? Bladder infection    ??? Chronic kidney disease    ??? Coronary arteriosclerosis    ??? Coronary atherosclerosis of artery bypass graft    ??? Decreased testosterone level    ??? Diverticular disease    ??? Essential hypertension    ??? Gout    ??? History of acute renal failure    ??? History of anemia     ??? History of malignant neoplasm of pancreas    ??? Hx of CABG    ??? Kidney stone    ??? Kidney stones    ??? Neuropathy    ??? Pancreatic cancer ([place])    ??? Sepsis ([place])    ??? Tremor      Past Surgical History:   Procedure Laterality Date   ??? HX ORTHOPAEDIC      back   ??? HX OTHER SURGICAL  2006    whipple procedure done for pancreatic cancer   ??? HX UROLOGICAL  2012    Percutaneous extraction of a kidney stone w/ fragmentation procedure      Past Surgical History:   Procedure Laterality Date   ??? HX ORTHOPAEDIC      back   ??? HX OTHER SURGICAL  2006    whipple procedure done for pancreatic cancer   ??? HX UROLOGICAL  2012    Percutaneous extraction of a kidney stone w/ fragmentation procedure         Social History   Has never smoked, drink or use drugs.  He is married.  He is a retired Engineer, agricultural.  He grew up [place] [facility] [place] [facility] but interestingly roots for the [employer] and not the [employer].      Family History   Noncontributory    Medications     Current Medications:  Current Facility-Administered Medications   Medication Dose Route Frequency Provider Last Rate Last Dose   ??? polyvinyl alcohol-povidon(PF) (REFRESH CLASSIC) 1.4-0.6 % ophthalmic solution 2 Drop  2 Drop Both Eyes Q6H PRN Knox Saliva, MD   2 Drop at 06/23/18 [phone]   ??? insulin regular (NOVOLIN R, HUMULIN R) injection 10 Units  10 Units IntraVENous ONCE Skandaraj, Kulasegaram, MD       ??? dextrose (D50W) injection syrg 25 g  25 g IntraVENous ONCE Skandaraj, Kulasegaram, MD       ??? patiromer calcium sorbitex (VELTASSA) powder 8.4 g  8.4 g Oral NOW Edger House, MD       ??? calcium gluconate 1 gram in sodium chloride (ISO-OSM) 50 mL infusion  1 g IntraVENous ONCE Skandaraj, Kulasegaram, MD       ??? heparin (porcine) injection 5,000 Units  5,000 Units SubCUTAneous Q12H Edger House, MD   5,000 Units at 06/23/18 0900    ??? cefTRIAXone (ROCEPHIN) 1 g in 0.9% sodium chloride (MBP/ADV) 50 mL MBP  1 g IntraVENous Q24H Skandaraj, Kulasegaram, MD       ??? 0.9% sodium chloride infusion  100 mL/hr IntraVENous CONTINUOUS Skandaraj, Kulasegaram, MD 100 mL/hr at 06/23/18 0402 100 mL/hr at 06/23/18 0402   ??? [Held by provider]  amLODIPine (NORVASC) tablet 5 mg  5 mg Oral DAILY Skandaraj, Kulasegaram, MD       ??? aspirin chewable tablet 81 mg  81 mg Oral DAILY Skandaraj, Kulasegaram, MD   81 mg at 06/23/18 0900   ??? metoprolol succinate (TOPROL-XL) XL tablet 25 mg  25 mg Oral DAILY Edger House, MD   25 mg at 06/23/18 0806   ??? tamsulosin (FLOMAX) capsule 0.4 mg  0.4 mg Oral DAILY Edger House, MD   0.4 mg at 06/23/18 0806   ??? naloxone (NARCAN) injection 0.1 mg  0.1 mg IntraVENous PRN Edger House, MD       ??? acetaminophen (TYLENOL) tablet 650 mg  650 mg Oral Q4H PRN Edger House, MD        Or   ??? acetaminophen (TYLENOL) solution 650 mg  650 mg Oral Q4H PRN Skandaraj, Kulasegaram, MD        Or   ??? acetaminophen (TYLENOL) suppository 650 mg  650 mg Rectal Q4H PRN Skandaraj, Kulasegaram, MD       ??? ondansetron (ZOFRAN) injection 4 mg  4 mg IntraVENous Q4H PRN Edger House, MD       ??? polyethylene glycol (MIRALAX) packet 17 g  17 g Oral DAILY PRN Edger House, MD           Prior to Admission Medications:  Medications Prior to Admission   Medication Sig   ??? ketorolac (TORADOL) 10 mg tablet Take 1 Tab by mouth every six (6) hours as needed for Pain.   ??? metoprolol tartrate (LOPRESSOR) 25 mg tablet Take 25 mg by mouth daily.   ??? gabapentin (NEURONTIN) 600 mg tablet Take 600 mg by mouth two (2) times a day.   ??? amLODIPine (NORVASC) 5 mg tablet Take 5 mg by mouth daily.   ??? allopurinol (ZYLOPRIM) 300 mg tablet Take  by mouth daily.   ??? tamsulosin (FLOMAX) 0.4 mg capsule Take 0.4 mg by mouth daily.   ??? lipase-protease-amylase (ZENPEP) 20,000-63,000- 84,000 unit capsule Take 8 Caps by mouth daily.    ??? tiZANidine (ZANAFLEX) 4 mg tablet Take 4 mg by mouth nightly.   ??? fluticasone propionate 100 mcg/actuation dsdv Take  by inhalation.   ??? aspirin 81 mg chewable tablet Take 81 mg by mouth daily.   ??? TESTOSTERONE IM by IntraMUSCular route.       Allergies     Allergies   Allergen Reactions   ??? Other Food Other (comments)     VISTAID  ANESTHESIA   ??? Codeine Itching and Swelling   ??? Oxycodone Anaphylaxis   ??? Hydroxyzine Pamoate Itching and Other (comments)     Other reaction(s): Unknown     ??? Meperidine Itching, Palpitations, Swelling and Other (comments)     Other reaction(s): Unknown     ??? Metoclopramide Itching, Other (comments) and Swelling     Other reaction(s): Other (See Comments)  Reaction unknown   Reaction unknown      ??? Morphine Other (comments) and Nausea and Vomiting   ??? Oxycodone-Acetaminophen Itching   ??? Prochlorperazine Itching, Other (comments) and Unknown (comments)     Other reaction(s): Unknown  Unsure of reaction     ??? Hydroxyzine Hcl Hives       Physical Exam     Visit Vitals  BP 156/73 (BP 1 Location: Left arm, BP Patient Position: Supine)   Pulse 86   Temp 97.9 ??F (36.6 ??C)   Resp 16   Ht 5\' 8"  (1.727 m)  Wt 61.2 kg (135 lb)   SpO2 97%   BMI 20.53 kg/m??     GEN:  WNWD NAD  HEENT: sclerae anicteric, conjunctivae WNL   NECK: No JVD,Trachea midline. No thyromegally  LYMPHORETICULAR:  No lymphadenopathy.   LUNGS:Clear to Auscultaion, Good Breath sounds and air Entry.Normal percussion.  CVS: Heart RRR, No S4 or S3 Gallop, No Pericardial Rub.  ABD: Soft, Non Tender, No Organomegaly, Bowel Sounds normal.  BACK:  No  Spinal or CVA  tenderness. No deformities  EXTR: Without Edema.   SKIN: Skin without rashes, NL turgor  CNS: Speech normal, alert and oriented    Laboratory:     CBC w/Diff  Lab Results   Component Value Date/Time    WBC 6.7 06/23/2018 06:25 AM    RBC 3.26 (L) 06/23/2018 06:25 AM    HCT 28.2 (L) 06/23/2018 06:25 AM    MCV 86.5 06/23/2018 06:25 AM    MCH 26.4 06/23/2018 06:25 AM     MCHC 30.5 06/23/2018 06:25 AM     Lab Results   Component Value Date/Time    MONOS 3.0 06/23/2018 06:25 AM    EOS 0.0 06/23/2018 06:25 AM    BASOS 0.0 06/23/2018 06:25 AM       Recent Labs     06/23/18  0625 06/22/18  0825   GLU 178* 124*   NA 141 140   K 6.7* 5.1   CL 115* 113*   CO2 21 19*   BUN 50* 60*   CREA 2.6* 3.7*   CA 9.4 9.2   AGAP 5 8          Thankyou for asking me to assist in this patient's care.  Will follow.    Horace Porteous, MD  June 23, 2018 9:04 [place].  [phone]

## 2018-06-23 NOTE — Progress Notes (Signed)
 Problem: Falls - Risk of  Goal: *Absence of Falls  Description: Document Tommy Brown Fall Risk and appropriate interventions in the flowsheet.  Outcome: Progressing Towards Goal  Note: Fall Risk Interventions:            Medication Interventions: Bed/chair exit alarm, Patient to call before getting OOB, Teach patient to arise slowly

## 2018-06-23 NOTE — Progress Notes (Signed)
Progress Notes by Edger House, MD at 06/23/18 0830                Author: Edger House, MD  Service: Hospitalist  Author Type: Physician       Filed: 06/23/18 0837  Date of Service: 06/23/18 0830  Status: Signed          Editor: Edger House, MD (Physician)                             Hospitalist Progress Note      Patient: Tommy Brown               Sex: male          DOA: 06/22/2018         Date of Birth:  [birth year]      Age:  73 y.o.        LOS:  LOS: 1 day       PCP: Daralene Milch, MD      Assessment and Plan:     Assessment   Acute hyperkalemia   Acute kidney injury, suspect due to obstructive uropathy, NSAIDs use   Left-sided hydronephrosis due to obstructing stone s/p left J stent on 06/22/2018   Nephrolithiasis   Metabolic acidosis, resolved.   Hypertension   Coronary artery disease, history of CABG   Pancreatic cancer history   BPH   Hyperlipidemia      Plan   STAT repeat K   Ordered insulin/dextrose   1 dose calcium gluconate   Transfer to tele monitoring   Veltassa one dose   Nephrology consult   Monitor UOP, Foley in place   Creatinine improving   S/p left ureteral stent for left side obstructing stone and hydro on CT   IV hydration.   Urine culture GNB, add IV rocephin   Hold Gabapentin   Low K diet.   DVT ppx            Subjective:  Resting in bed, denies flank pain, SOB, chest pain         [name] is a  73 y.o. year old male who is being seen for  Acute hyperkalemia.        Objective:         Vital Signs:   Visit Vitals      BP  156/73 (BP 1 Location: Left arm, BP Patient Position: Supine)     Pulse  86     Temp  97.9 ??F (36.6 ??C)     Resp  16     Ht  5\' 8"  (1.727 m)     Wt  61.2 kg (135 lb)     SpO2  97%        BMI  20.53 kg/m??        Physical Exam:   GENERAL: Alert and oriented times 3.   HEAD: Head normocephalic, atraumatic    Eyes: Conjunctivae pink, anicteric   CARDIOVASCULAR: S1 and S2 heard, no murmur   RESPIRATORY: Effort normal. no  crackles. no ronchi   ABDOMEN: Soft, nontender, nondistended. Normal bowel present.    NEUROLOGIC: Alert and oriented times 3. Able to move all 4 extremities.    PSYCHIATRIC: Normal affect.      Intake and Output:   Last three shifts:  04/26 1901 - 04/28 0700   In: 2151.7 [P.O.:120; I.V.:2031.7]   Out: 1900 [UMPNT:6144]  Lab Results:     Recent Results (from the past 24 hour(s))     URINALYSIS W/ RFLX MICROSCOPIC          Collection Time: 06/22/18  9:15 AM         Result  Value  Ref Range            Color  YELLOW  YELLOW,STRAW         Appearance  CLEAR  CLEAR         Glucose  NEGATIVE  NEGATIVE,Negative mg/dl       Bilirubin  NEGATIVE  NEGATIVE,Negative         Ketone  NEGATIVE  NEGATIVE,Negative mg/dl       Specific gravity  1.025  1.005 - 1.030         Blood  NEGATIVE  NEGATIVE,Negative         pH (UA)  5.5  5.0 - 9.0         Protein  NEGATIVE  NEGATIVE,Negative mg/dl       Urobilinogen  0.2  0.0 - 1.0 mg/dl       Nitrites  NEGATIVE  NEGATIVE,Negative         Leukocyte Esterase  NEGATIVE  NEGATIVE,Negative         CULTURE, URINE          Collection Time: 06/22/18  9:15 AM         Result  Value  Ref Range            Isolate  (A)                30,000 CFU/mL   Gram Negative Bacilli Isolated   Identification And Susceptibility To Follow          METABOLIC PANEL, BASIC          Collection Time: 06/23/18  6:25 AM         Result  Value  Ref Range            Sodium  141  136 - 145 mEq/L       Potassium  6.7 (HH)  3.5 - 5.1 mEq/L       Chloride  115 (H)  98 - 107 mEq/L       CO2  21  21 - 32 mEq/L       Glucose  178 (H)  74 - 106 mg/dl       BUN  50 (H)  7 - 25 mg/dl       Creatinine  2.6 (H)  0.6 - 1.3 mg/dl       GFR est AA  31.0          GFR est non-AA  26          Calcium  9.4  8.5 - 10.1 mg/dl       Anion gap  5  5 - 15 mmol/L       CBC WITH AUTOMATED DIFF          Collection Time: 06/23/18  6:25 AM         Result  Value  Ref Range            WBC  6.7  4.0 - 11.0 1000/mm3       RBC  3.26 (L)  3.80 - 5.70 M/uL        HGB  8.6 (L)  12.4 - 17.2 gm/dl       HCT  28.2 (L)  37.0 - 50.0 %       MCV  86.5  80.0 - 98.0 fL       MCH  26.4  23.0 - 34.6 pg       MCHC  30.5  30.0 - 36.0 gm/dl       PLATELET  256  140 - 450 1000/mm3       MPV  11.0 (H)  6.0 - 10.0 fL       RDW-SD  52.2 (H)  35.1 - 43.9         NRBC  0  0 - 0         IMMATURE GRANULOCYTES  0.6  0.0 - 3.0 %       NEUTROPHILS  85.2 (H)  34 - 64 %       LYMPHOCYTES  11.2 (L)  28 - 48 %       MONOCYTES  3.0  1 - 13 %       EOSINOPHILS  0.0  0 - 5 %            BASOPHILS  0.0  0 - 3 %        Images:   Ct Abd Pelv Wo Cont      Result Date: 06/22/2018   CT Abdomen and Pelvis without Indication: Left hydronephrosis. Evaluate for obstructing stone or lesion. Comparison: Ultrasound 06/22/2018. CT 05/21/2018. TECHNIQUE: CT of the abdomen and pelvis WITHOUT intravenous contrast. Coronal and sagittal reformations  were obtained.    All CT exams at this facility use one or more dose reduction techniques including automatic exposure control, mA/kV adjustment per patient's size, or iterative reconstruction technique. DICOM format imaged data is available to non-affiliated  external healthcare facilities or entities on a secure, media free, reciprocal searchable basis with patient authorization for 12 months following the date of the study. DISCUSSION: ABSENCE OF INTRAVENOUS CONTRAST DECREASES SENSITIVITY FOR DETECTION OF  FOCAL LESIONS AND VASCULAR PATHOLOGY. LOWER THORAX: Coronary artery calcifications. HEPATOBILIARY: Multiple subcentimeter foci of hypoattenuation, too small to characterize but unchanged. Cholecystectomy. Left intrahepatic pneumobilia, unchanged. SPLEEN:  No splenomegaly. PANCREAS: Postoperative changes from prior Whipple procedure. No mass or duct dilatation in the residual pancreatic tail. ADRENALS: No adrenal nodules. KIDNEYS/URETERS: Multiple bilateral renal cysts. Multiple small (2 to 4 mm) nonobstructing  bilateral renal calculi. There is a 10 x 9 mm nonobstructing  calculus in the lower pole of the left kidney. Moderate left hydronephrosis and hydroureter. There is a 8 x 7 mm obstructing calculus in the proximal left ureter. No right hydronephrosis. No  suspicious renal lesions. PELVIC ORGANS/BLADDER: Enlarged prostate. Penile implant. The bladder is unremarkable. PERITONEUM / RETROPERITONEUM: No free air or fluid. LYMPH NODES: No lymphadenopathy. VESSELS: Extensive atherosclerotic vascular calcifications.  Infrarenal abdominal aorta ectasia. GI TRACT: No distention or wall thickening. Diverticulosis. Normal appendix. BONES AND SOFT TISSUES: Posterior lumbar fixation at L4-5. Mild retrolisthesis at L2-3 and L3-4. Moderate to advanced disc height loss at  L2-3. Mild spondylosis of the remaining lumbar spine.       IMPRESSION: 1.  Obstructing 8 mm calculus in the left proximal ureter causing moderate left hydronephrosis. 2.  Multiple additional nonobstructing bilateral renal calculi. 3.  Multiple bilateral renal cysts. 4.  Diverticulosis. 5.  Postoperative changes  from prior Whipple procedure. 6.  Multiple hypoattenuating liver lesions, unchanged.       [place] Retroperitoneum Comp      Result Date: 06/22/2018   INDICATION: AKI with history  of renal stones;    COMPARISON: CT 05/21/2018 and MRI 01/19/2018. TECHNIQUE: Renal Ultrasound FINDINGS: Right Kidney: Measures 9.6 x 4.3 x 6.4 cm. There is good corticomedullary differentiation. No hydronephrosis. Multiple  echogenic foci in the renal pelvis, consistent with calculi, the largest measuring 0.4 cm. Multiple simple cysts, the largest cyst in the mid kidney measuring 0.4 x 0.5 x 0.4 cm. No solid mass. Left Kidney: Measures 11.0 x 4.4 x 6.6 cm. There is good  corticomedullary differentiation. Moderate hydronephrosis and proximal hydroureter. Multiple echogenic foci in the renal pelvis, consistent with calculi, the largest measuring 0.6 cm. Multiple simple cysts, the largest cyst in the mid kidney measuring  1.8 x 1.7 x 1.9 cm. No solid  mass. Bladder: Unremarkable.       IMPRESSION: 1.  Left hydronephrosis. 2.  Multiple bilateral nonobstructing renal calculi. 3.  Multiple bilateral simple cysts.       Xr Retro Urethrocystography      Result Date: 06/22/2018   INDICATION: L retrograde / L stent placement     PROCEDURE: XR RETRO URETHROCYSTOGRAPHY COMPARISON: None FLUOROSCOPIC TIME: 7.5 seconds. FINDINGS: Left retrograde, left stent placement.       IMPRESSION: Left retrograde, left stent placement.       Medications:     Current Facility-Administered Medications          Medication  Dose  Route  Frequency           ?  polyvinyl alcohol-povidon(PF) (REFRESH CLASSIC) 1.4-0.6 % ophthalmic solution 2 Drop   2 Drop  Both Eyes  Q6H PRN     ?  insulin regular (NOVOLIN R, HUMULIN R) injection 10 Units   10 Units  IntraVENous  ONCE     ?  dextrose (D50W) injection syrg 25 g   25 g  IntraVENous  ONCE     ?  patiromer calcium sorbitex (VELTASSA) powder 8.4 g   8.4 g  Oral  NOW     ?  calcium gluconate 1 gram in sodium chloride (ISO-OSM) 50 mL infusion   1 g  IntraVENous  ONCE           ?  heparin (porcine) injection 5,000 Units   5,000 Units  SubCUTAneous  Q12H           ?  cefTRIAXone (ROCEPHIN) 1 g in 0.9% sodium chloride (MBP/ADV) 50 mL MBP   1 g  IntraVENous  Q24H     ?  0.9% sodium chloride infusion   100 mL/hr  IntraVENous  CONTINUOUS     ?  [Held by provider] amLODIPine (NORVASC) tablet 5 mg   5 mg  Oral  DAILY     ?  [Held by provider] aspirin chewable tablet 81 mg   81 mg  Oral  DAILY     ?  metoprolol succinate (TOPROL-XL) XL tablet 25 mg   25 mg  Oral  DAILY     ?  tamsulosin (FLOMAX) capsule 0.4 mg   0.4 mg  Oral  DAILY     ?  naloxone (NARCAN) injection 0.1 mg   0.1 mg  IntraVENous  PRN     ?  acetaminophen (TYLENOL) tablet 650 mg   650 mg  Oral  Q4H PRN          Or           ?  acetaminophen (TYLENOL) solution 650 mg   650 mg  Oral  Q4H PRN  Or           ?  acetaminophen (TYLENOL) suppository 650 mg   650 mg  Rectal  Q4H PRN     ?   ondansetron (ZOFRAN) injection 4 mg   4 mg  IntraVENous  Q4H PRN           ?  polyethylene glycol (MIRALAX) packet 17 g   17 g  Oral  DAILY PRN     total time spent was 36 minutes    Dragon dictation software was used for portions of this report. Unintended errors in transcription may occur.   Edger House, MD   June 23, 2018   8:37 AM

## 2018-06-23 NOTE — Progress Notes (Signed)
Patient in room [place] is asking for a sleeping pill. He had one time dose of Melatonin 3mg .Could you prescribe a sleeping pill for him. Ronal Fear RN  [phone]

## 2018-06-23 NOTE — Progress Notes (Signed)
Patient admitted on 06/22/2018 from ED from home with   Chief Complaint   Patient presents with   . Abnormal Lab Results          The patient is being treated for    PMH:   Past Medical History:   Diagnosis Date   . Arthritis    . Benign prostate hyperplasia    . Bladder infection    . Chronic kidney disease    . Coronary arteriosclerosis    . Coronary atherosclerosis of artery bypass graft    . Decreased testosterone level    . Diverticular disease    . Essential hypertension    . Gout    . History of acute renal failure    . History of anemia    . History of malignant neoplasm of pancreas    . Hx of CABG    . Kidney stone    . Kidney stones    . Neuropathy    . Pancreatic cancer (HCC)    . Sepsis (HCC)    . Tremor         Treatment Team: Treatment Team: Attending Provider: Madaline Savage, MD; Consulting Provider: Madaline Savage, MD; Consulting Provider: Donah Driver, MD; Care Manager: Candace Cruise; Consulting Provider: Herma Carson, MD      The patient has been admitted to the hospital 1 times in the past 12 months.    Previous 4 Admission Dates Admission and Discharge Diagnosis Interventions Barriers Disposition                                 Patient and Family/Caregivers Goals of Care: tx aki    Caregivers Participating in Plan of Care/Discharge Plan with the patient: spouse    Tentative dc plan: home    Anticipated DME needs for discharge: na    PRESCREENING COMPLETED FOR SNF na      Does the patient have appropriate clothing available to be worn at discharge? yes      The patient and care participants are willing to travel na area for discharge facility.  The patient and plan of care participants have been provided with a list of all available Rehab Facilities or Home Health agencies as applicable. CM will follow up with a list of facilities or agencies that are offer acceptance.    CM has disclosed any financial interest that [facility] may have with any facility or agency.    Anticipated  Discharge Date: 4/30    The plan of care and discharge plan has been discussed with Madaline Savage, * and all other appropriate providers and adjusted per interdisciplinary team recommendations and in discussion with the patient and the patient designated Care Plan Participants.    Barriers to Healthcare Success/ Readmission Risk Factors: Htn, CAD hx of CABG, BPH.    Consults:  Palliative Care Consult Recommended: n  Transitional Care Clinic Referral: n  Transitional Nurse Navigator Referral: n  Oncology Navigator Referral: n  SW consulted: n  [facility] Consulted: n  Outside Hospital/Community Resources Referrals and Collaboration: n    Food/Nutrition Needs:   n                   Dietician Consulted: n    RRAT Score: Low Risk            12       Total Score  3 Has Seen PCP in Last 6 Months (Yes=3, No=0)    4 IP Visits Last 12 Months (1-3=4, 4=9, >4=11)    5 Pt. Coverage (Medicare=5 , Medicaid, or Self-Pay=4)        Criteria that do not apply:    Married. Living with Significant Other. Assisted Living. LTAC. SNF. or   Rehab    Patient Length of Stay (>5 days = 3)    Charlson Comorbidity Score (Age + Comorbid Conditions)           PCP: Cleatis Polka, MD . How do you get to your doctor appointments?self    Specialists:      Dialysis Unit: n    Pharmacy: [facility]. Are there any medications that you have trouble paying for? Any difficulty getting your medications?N    DME available at Home:na    Home O2 L Flow:  na            Home O2 Provider: na    Home Environment and Prior Level of Function: Lives at [address]  [address]  [place] [address] @HOMEPHONE @. Lives with spouse. 1 story. 2 Steps into home.  Responsibilities at home include adls    Prior to admission open services: na    Home Health Agency-  Personal Care Agency-    Extended Emergency Contact Information  Primary Emergency Contact: GAMMON,MARCIA  Mobile Phone:  [phone]  Relation: None     Transportation: spouse will transport home    Therapy Recommendations:    OT = n    PT = n    SLP =  n     RT Home O2 Evaluation =  n    Wound Care =  n    Case Management Assessment    ABUSE/NEGLECT SCREENING   Physical Abuse/Neglect: Denies   Sexual Abuse: Denies   Sexual Abuse: Denies   Other Abuse/Issues: Denies          PRIMARY DECISION MAKER                                   CARE MANAGEMENT INTERVENTIONS   Readmission Interview Completed: Not Applicable   PCP Verified by CM: Yes(deguzeman)           Mode of Transport at Discharge: Other (see comment)(wife)       Transition of Care Consult (CM Consult): Discharge Planning           MyChart Signup: No   Discharge Durable Medical Equipment: No   Physical Therapy Consult: No   Occupational Therapy Consult: No   Speech Therapy Consult: No   Current Support Network: Lives with Spouse   Reason for Referral: DCP Rounds   History Provided By: Patient, Spouse, Medical Record   Patient Orientation: Alert and Oriented, Place, Person, Situation, Self   Cognition: Alert   Support System Response: Concerned, Cooperative   Previous Living Arrangement: Lives with Family Independent   Home Accessibility: Steps   Prior Functional Level: Independent in ADLs/IADLs   Current Functional Level: Independent in ADLs/IADLs       Can patient return to prior living arrangement: Yes   Ability to make needs known:: Good   Family able to assist with home care needs:: Yes               Types of Needs Identified: Treatment Education       Confirm  Follow Up Transport: Family                  DISCHARGE LOCATION   Discharge Placement: Home

## 2018-06-23 NOTE — Progress Notes (Signed)
Progress  Notes by Patrice Paradise at 06/23/18 9381                Author: Patrice Paradise  Service: --  Author Type: Registered Nurse       Filed: 06/23/18 0816  Date of Service: 06/23/18 0816  Status: Signed          Editor: Patrice Paradise (Registered Nurse)                        PAGER ID: [phone] Dr. Robie Ridge  MESSAGE: [phone] [address] [place] has a potassium of 6.7 this morning. [place], [place]

## 2018-06-23 NOTE — Progress Notes (Signed)
Progress Notes by Jimmey Ralph, PA-C at 06/23/18 9528                Author: Jimmey Ralph, PA-C  Service: Urology  Author Type: Physician Assistant       Filed: 06/23/18 1131  Date of Service: 06/23/18 4132  Status: Signed           Editor: Simonne Maffucci (Physician Assistant)  Cosigner: Elvin So, MD at 06/23/18 1448                          Urology Progress Note              Assessment/Plan:          Patient Active Problem List        Diagnosis  Code         ?  Sepsis ([place])  A41.9         ?  AKI (acute kidney injury) ([place])  N17.9        ASSESSMENT:    - Left 55mm proximal ureteral stone with hydroureteronephrosis     - s/p cystoscopy, left JJ stent on 06/22/2018 by Dr. Claiborne Billings    - AKI with baseline creatinine 1.5               - Peak creatinine 3.7 (4/27)               - Current creatinine 2.6               - Likely due in combination of large Toradol in take and obstructing stone    - Hyperkalemia - currently K is 6.7    - Hx of nephrolithiasis s/p URS with LL in 02/2017   - Hx of ED with IPP   - Hx of Pancreatic cancer s/p Whipple in 2006, chemotherapy and radiation    ??      Plan:     - Patient is aware JJ stent is temporary and will need removal or exchange in near future, no later than 3 months from now. Patient is aware will need definitive stone treatment outpatient in near future.  He would like his wife to be able to be with him during procedure    - Urine culture 4/27 x2 - one showing 30k GNB, the other is still too early. Antibiotics as per primary team - levoquin was dced by primary team due to hyperkalemia, will add antibiotics soon, likely rocephin    - Maintain foley catheter until creatinine nadir - placed during OR procedure for renal drainage to help AKI    - Continue Flomax    - Will see tomorrow for possible voiding trail       Follow up arranged? NO - will need follow up with Dr. Enos Fling, PA-C      [phone]            Subjective:        Daily Progress Note: 06/23/2018 8:23 AM      Tommy Brown is doing well. He  reports pain is absent. He has  no new complaints. He is tolerating a solid diet . Indwelling catheter is draining well.        Objective:        Visit Vitals      BP  156/73 (BP 1 Location: Left arm, BP  Patient Position: Supine)     Pulse  86     Temp  97.9 ??F (36.6 ??C)     Resp  16     Ht  5\' 8"  (1.727 m)     Wt  61.2 kg (135 lb)     SpO2  97%        BMI  20.53 kg/m??            Temp (24hrs), Avg:92.8 ??F (33.8 ??C), Min:36.7 ??F (2.6 ??C), Max:98.3 ??F (36.8 ??C)         Intake and Output:   04/26 1901 - 04/28 0700   In: 2151.7 [P.O.:120; I.V.:2031.7]   Out: 1900 [Urine:1900]   No intake/output data recorded.      PHYSICAL EXAMINATION:    Visit Vitals      BP  156/73 (BP 1 Location: Left arm, BP Patient Position: Supine)     Pulse  86     Temp  97.9 ??F (36.6 ??C)     Resp  16     Ht  5\' 8"  (1.727 m)     Wt  61.2 kg (135 lb)     SpO2  97%        BMI  20.53 kg/m??        Constitutional: Well developed, well nourished.  No acute distress.     HEENT: Normocephalic, Atraumatic, EOM's intact    Respiratory: No respiratory distress or difficulties breathing    GU Male:   Foley:  draining well of yellow urine    Skin: No evidence of jaundice.  Normal color   Neuro/Psych:  Alert and oriented. Affect appropriate.       Lab/Data Review:   All lab results for the last 24 hours reviewed.         CT Results   (Last 48 hours)                                    06/22/18 1208    CT ABD PELV WO CONT  Final result            Impression:    IMPRESSION:             1.  Obstructing 8 mm calculus in the left proximal ureter causing moderate left      hydronephrosis.      2.  Multiple additional nonobstructing bilateral renal calculi.      3.  Multiple bilateral renal cysts.      4.  Diverticulosis.      5.  Postoperative changes from prior Whipple procedure.      6.  Multiple hypoattenuating liver lesions, unchanged.                               Narrative:    CT Abdomen and Pelvis without             Indication: Left hydronephrosis. Evaluate for obstructing stone or lesion.             Comparison: Ultrasound 06/22/2018. CT 05/21/2018.             TECHNIQUE:       CT of the abdomen and pelvis WITHOUT intravenous contrast. Coronal and sagittal      reformations were obtained.  All CT exams at this facility use one or more dose reduction techniques      including automatic exposure control, mA/kV adjustment per patient's size, or      iterative reconstruction technique. DICOM format imaged data is available to      non-affiliated external healthcare facilities or entities on a secure, media      free, reciprocal searchable basis with patient authorization for 12 months      following the date of the study.             DISCUSSION:             ABSENCE OF INTRAVENOUS CONTRAST DECREASES SENSITIVITY FOR DETECTION OF FOCAL      LESIONS AND VASCULAR PATHOLOGY.             LOWER THORAX: Coronary artery calcifications.             HEPATOBILIARY: Multiple subcentimeter foci of hypoattenuation, too small to      characterize but unchanged. Cholecystectomy. Left intrahepatic pneumobilia,      unchanged.      SPLEEN: No splenomegaly.      PANCREAS: Postoperative changes from prior Whipple procedure. No mass or duct      dilatation in the residual pancreatic tail.             ADRENALS: No adrenal nodules.      KIDNEYS/URETERS: Multiple bilateral renal cysts. Multiple small (2 to 4 mm)      nonobstructing bilateral renal calculi. There is a 10 x 9 mm nonobstructing      calculus in the lower pole of the left kidney. Moderate left hydronephrosis and      hydroureter. There is a 8 x 7 mm obstructing calculus in the proximal left      ureter. No right hydronephrosis. No suspicious renal lesions.      PELVIC ORGANS/BLADDER: Enlarged prostate. Penile implant. The bladder is      unremarkable.             PERITONEUM / RETROPERITONEUM: No free air or fluid.      LYMPH  NODES: No lymphadenopathy.      VESSELS: Extensive atherosclerotic vascular calcifications. Infrarenal abdominal      aorta ectasia.             GI TRACT: No distention or wall thickening. Diverticulosis. Normal appendix.             BONES AND SOFT TISSUES: Posterior lumbar fixation at L4-5. Mild retrolisthesis      at L2-3 and L3-4. Moderate to advanced disc height loss at L2-3. Mild      spondylosis of the remaining lumbar spine.                                     Labs:          Labs:  Results:        Chemistry      Recent Labs         06/23/18   0625  06/22/18   0825      GLU  178*  124*      NA  141  140      K  6.7*  5.1      CL  115*  113*      CO2  21  19*      BUN  50*  60*      CREA  2.6*  3.7*      CA  9.4  9.2      AGAP  5  8              CBC w/Diff  Recent Labs         06/23/18   0625  06/22/18   0825      WBC  6.7  7.4      RBC  3.26*  3.44*      HGB  8.6*  9.2*      HCT  28.2*  29.9*      PLT  256  276      GRANS  85.2*  73.1*      LYMPH  11.2*  17.9*      EOS  0.0  1.5           Cultures  No results for input(s): CULT in the last 72 hours.   All Micro Results         Procedure  Component  Value  Units  Date/Time        CULTURE, URINE [[id number]]  (Abnormal)  Collected:  06/22/18 0915        Order Status:  Completed  Specimen:  Urine from Clean catch  Updated:  06/23/18 0722          Isolate  --                 30,000 CFU/mL   Gram Negative Bacilli Isolated   Identification And Susceptibility To Follow             CULTURE, URINE [[id number]]  Collected:  06/22/18 1643        Order Status:  Completed  Specimen:  Urine from Bladder  Updated:  06/22/18 1833                          Urinalysis  Color      Date  Value  Ref Range  Status      06/22/2018  YELLOW  YELLOW,STRAW    Final            Appearance      Date  Value  Ref Range  Status      06/22/2018  CLEAR  CLEAR    Final            Specific gravity      Date  Value  Ref Range  Status      06/22/2018  1.025  1.005 - 1.030    Final            pH (UA)       Date  Value  Ref Range  Status      06/22/2018  5.5  5.0 - 9.0    Final            Protein      Date  Value  Ref Range  Status      06/22/2018  NEGATIVE  NEGATIVE,Negative mg/dl  Final            Ketone      Date  Value  Ref Range  Status      06/22/2018  NEGATIVE  NEGATIVE,Negative mg/dl  Final            Bilirubin      Date  Value  Ref Range  Status  06/22/2018  NEGATIVE  NEGATIVE,Negative    Final            Blood      Date  Value  Ref Range  Status      06/22/2018  NEGATIVE  NEGATIVE,Negative    Final            Urobilinogen      Date  Value  Ref Range  Status      06/22/2018  0.2  0.0 - 1.0 mg/dl  Final            Nitrites      Date  Value  Ref Range  Status      06/22/2018  NEGATIVE  NEGATIVE,Negative    Final            Leukocyte Esterase      Date  Value  Ref Range  Status      06/22/2018  NEGATIVE  NEGATIVE,Negative    Final            Potassium      Date  Value  Ref Range  Status      06/23/2018  6.7 (HH)  3.5 - 5.1 mEq/L  Final          Comment:          THE FOLLOWING TEST HAS BEEN DETERMINED TO HAVE A CRITICAL VALUE.   THE  _K  RESULT WAS CALLED TO (AND READ BACK BY)  matrine_.              THE NOTIFICATION WAS MADE ON _04/28/20   BY amw_               Creatinine      Date  Value  Ref Range  Status      06/23/2018  2.6 (H)  0.6 - 1.3 mg/dl  Final            BUN      Date  Value  Ref Range  Status      06/23/2018  50 (H)  7 - 25 mg/dl  Final            Prostate Specific Ag      Date  Value  Ref Range  Status      04/30/2018  0.6  0.0 - 4.0 ng/mL  Final                 PSA  No results for input(s): PSA in the last 72 hours.        Coagulation  No results found for: PTP, INR, APTT, INREXT

## 2018-06-23 NOTE — Consults (Signed)
Consults  by Tommy Porteous, MD at 06/23/18 [phone]                Author: Horace Porteous, MD  Service: Nephrology  Author Type: Physician       Filed: 06/23/18 0911  Date of Service: 06/23/18 0904  Status: Signed          Editor: Tommy Porteous, MD (Physician)                          Renal Consult           Patient: Tommy Brown  Age: 73 y.o.  Sex: male          Date of Birth: 1945/07/04  Admit Date: 06/22/2018  PCP: Tommy Milch, MD     MRN: [id number]   CSN: [id number]           Chief Complaint:     Chief Complaint       Patient presents with        ?  Abnormal Lab Results             Assessment:     1.  Acute kidney injury.  This is very likely due to obstructive uropathy, hypovolemia as well as Toradol use as an outpatient.  This is getting  better with good urine output.   2.  Hyperkalemia.  This is a little harder to explain.  In general, when patients have recovering renal function with good urine output hyperkalemia is rarely a problem particularly when his potassium is normal yesterday.  The patient states that he did not  have a hard time drawing his blood this morning but we do need to consider that this could have been a laboratory error in some way shape or form.  He has been managed medically quite appropriately.   3.  Postop day 1 status post left ureteral stenting due to obstructing stone.           Recommendations:     1.  I agree with medical management as has been done already.   2.  I agree with transfer to telemetry.   3.  Repeat BMP is already pending.  If the potassium is still high, I will likely increase his rate of IV fluid and dose with Lasix once to increase potassium output into his urine.   4.  I have ordered labs for tomorrow morning.   5.  The patient is a very complicated medical history but he made one thing clear to me.  He never wants dialysis.  Fortunately, I do not think this will ever become a realistic possibility.   6.  I discussed this at  length with the patient's wife over the phone.        Patient Active Problem List        Diagnosis  Code         ?  Sepsis ([place])  A41.9         ?  AKI (acute kidney injury) ([place])  N17.9             Problem list:     1.  Acute kidney injury   2.  Hyperkalemia   3.  Multiple episodes of nephrolithiasis requiring stenting and laser treatment previously   4.  Postoperative day 1 status post left double-J stent   5.  Coronary artery disease status post CABG in 1996  6.  Pancreatic cancer status post Whipple procedure in 2012 done at [facility] in [facility]   7.  Anemia        History of Present Illness      I was asked by Dr. Robie Brown to see this patient in consult. Tommy Brown  is a 73 y.o. year old male  [place] who presents with acute kidney injury.  The patient was being managed as an outpatient for nephrolithiasis and was taking Toradol at home up to 4 pills at a time for left renal colic.   He was sent to the emergency room a few days ago due to acute kidney injury found on lab work.  He underwent double-J stenting yesterday as noted in the history section.  He currently feels a lot better and has no acute complaints.  Consult is for hyperkalemia.        Review of Systems     Constitutional: Negative for fever  and chills.    Skin: Negative for rash or skin lesions.    Eyes: Sclera clear   Cardiovascular:  Negative for chest pain,exertional dyspnea and palpitations.    Respiratory: Negative for cough and hemoptysis,shortness of breath,orthopnea or pnd.    Gastrointestinal: Negative for nausea and vomitting. Negative for abdominal pain diarrhea or constipation or melena.    Genitourinary: Negative for dysuria,blood in the urine,frequency or burning on micturition. No difficulty with initiating urination.   Musculoskeletal: Negative  for arthralgias,back pain or neck pain.   Neurological: Negative for dizziness or syncopal episodes    Psychiatric: Negative.         Past  Medical/Psychiatric/Surgical History          Past Medical History:        Diagnosis  Date         ?  Arthritis       ?  Benign prostate hyperplasia       ?  Bladder infection       ?  Chronic kidney disease       ?  Coronary arteriosclerosis       ?  Coronary atherosclerosis of artery bypass graft       ?  Decreased testosterone level       ?  Diverticular disease       ?  Essential hypertension       ?  Gout       ?  History of acute renal failure       ?  History of anemia       ?  History of malignant neoplasm of pancreas       ?  Hx of CABG       ?  Kidney stone       ?  Kidney stones       ?  Neuropathy       ?  Pancreatic cancer ([place])       ?  Sepsis ([place])           ?  Tremor            Past Surgical History:         Procedure  Laterality  Date          ?  HX ORTHOPAEDIC              back          ?  HX OTHER SURGICAL    2006  whipple procedure done for pancreatic cancer          ?  HX UROLOGICAL    2012          Percutaneous extraction of a kidney stone w/ fragmentation procedure           Past Surgical History:         Procedure  Laterality  Date          ?  HX ORTHOPAEDIC              back          ?  HX OTHER SURGICAL    2006          whipple procedure done for pancreatic cancer          ?  HX UROLOGICAL    2012          Percutaneous extraction of a kidney stone w/ fragmentation procedure               Social History     Has never smoked, drink or use drugs.  He is married.  He is a retired Engineer, agricultural.  He grew up in [place] but interestingly roots for the  [employer] and not the [employer].           Family History     Noncontributory        Medications        Current Medications:     Current Facility-Administered Medications             Medication  Dose  Route  Frequency  Provider  Last Rate  Last Dose              ?  polyvinyl alcohol-povidon(PF) (REFRESH CLASSIC) 1.4-0.6 % ophthalmic solution 2 Drop   2 Drop  Both Eyes  Q6H PRN  Tommy Saliva, MD     2 Drop at  06/23/18 [phone]     ?  insulin regular (NOVOLIN R, HUMULIN R) injection 10 Units   10 Units  IntraVENous  ONCE  Skandaraj, Kulasegaram, MD           ?  dextrose (D50W) injection syrg 25 g   25 g  IntraVENous  ONCE  Skandaraj, Kulasegaram, MD           ?  patiromer calcium sorbitex (VELTASSA) powder 8.4 g   8.4 g  Oral  NOW  Skandaraj, Kulasegaram, MD           ?  calcium gluconate 1 gram in sodium chloride (ISO-OSM) 50 mL infusion   1 g  IntraVENous  ONCE  Skandaraj, Kulasegaram, MD           ?  heparin (porcine) injection 5,000 Units   5,000 Units  SubCUTAneous  Q12H  Edger House, MD     5,000 Units at 06/23/18 0900              ?  cefTRIAXone (ROCEPHIN) 1 g in 0.9% sodium chloride (MBP/ADV) 50 mL MBP   1 g  IntraVENous  Q24H  Skandaraj, Kulasegaram, MD                    ?  0.9% sodium chloride infusion   100 mL/hr  IntraVENous  CONTINUOUS  Edger House, MD  100 mL/hr at 06/23/18 0402  100 mL/hr at 06/23/18 0402     ?  [Held by provider] amLODIPine (NORVASC) tablet  5 mg   5 mg  Oral  DAILY  Skandaraj, Kulasegaram, MD           ?  aspirin chewable tablet 81 mg   81 mg  Oral  DAILY  Skandaraj, Kulasegaram, MD     81 mg at 06/23/18 0900     ?  metoprolol succinate (TOPROL-XL) XL tablet 25 mg   25 mg  Oral  DAILY  Edger House, MD     25 mg at 06/23/18 1829     ?  tamsulosin (FLOMAX) capsule 0.4 mg   0.4 mg  Oral  DAILY  Edger House, MD     0.4 mg at 06/23/18 9371     ?  naloxone (NARCAN) injection 0.1 mg   0.1 mg  IntraVENous  PRN  Edger House, MD           ?  acetaminophen (TYLENOL) tablet 650 mg   650 mg  Oral  Q4H PRN  Edger House, MD                Or              ?  acetaminophen (TYLENOL) solution 650 mg   650 mg  Oral  Q4H PRN  Edger House, MD                Or              ?  acetaminophen (TYLENOL) suppository 650 mg   650 mg  Rectal  Q4H PRN  Edger House, MD           ?  ondansetron (ZOFRAN) injection 4 mg   4 mg   IntraVENous  Q4H PRN  Skandaraj, Kulasegaram, MD                    ?  polyethylene glycol (MIRALAX) packet 17 g   17 g  Oral  DAILY PRN  Edger House, MD                 Prior to Admission Medications:     Medications Prior to Admission        Medication  Sig         ?  ketorolac (TORADOL) 10 mg tablet  Take 1 Tab by mouth every six (6) hours as needed for Pain.     ?  metoprolol tartrate (LOPRESSOR) 25 mg tablet  Take 25 mg by mouth daily.     ?  gabapentin (NEURONTIN) 600 mg tablet  Take 600 mg by mouth two (2) times a day.     ?  amLODIPine (NORVASC) 5 mg tablet  Take 5 mg by mouth daily.     ?  allopurinol (ZYLOPRIM) 300 mg tablet  Take  by mouth daily.     ?  tamsulosin (FLOMAX) 0.4 mg capsule  Take 0.4 mg by mouth daily.     ?  lipase-protease-amylase (ZENPEP) 20,000-63,000- 84,000 unit capsule  Take 8 Caps by mouth daily.     ?  tiZANidine (ZANAFLEX) 4 mg tablet  Take 4 mg by mouth nightly.     ?  fluticasone propionate 100 mcg/actuation dsdv  Take  by inhalation.     ?  aspirin 81 mg chewable tablet  Take 81 mg by mouth daily.         ?  TESTOSTERONE IM  by IntraMUSCular route.  Allergies          Allergies        Allergen  Reactions         ?  Other Food  Other (comments)             VISTAID   ANESTHESIA         ?  Codeine  Itching and Swelling     ?  Oxycodone  Anaphylaxis     ?  Hydroxyzine Pamoate  Itching and Other (comments)             Other reaction(s): Unknown            ?  Meperidine  Itching, Palpitations, Swelling and Other (comments)             Other reaction(s): Unknown            ?  Metoclopramide  Itching, Other (comments) and Swelling             Other reaction(s): Other (See Comments)   Reaction unknown    Reaction unknown             ?  Morphine  Other (comments) and Nausea and Vomiting     ?  Oxycodone-Acetaminophen  Itching     ?  Prochlorperazine  Itching, Other (comments) and Unknown (comments)             Other reaction(s): Unknown   Unsure of reaction             ?  Hydroxyzine Hcl  Hives             Physical Exam        Visit Vitals      BP  156/73 (BP 1 Location: Left arm, BP Patient Position: Supine)     Pulse  86     Temp  97.9 ??F (36.6 ??C)     Resp  16     Ht  5\' 8"  (1.727 m)     Wt  61.2 kg (135 lb)     SpO2  97%        BMI  20.53 kg/m??        GEN:  WNWD NAD   HEENT: sclerae anicteric, conjunctivae WNL    NECK: No JVD,Trachea midline. No thyromegally   LYMPHORETICULAR:  No lymphadenopathy.    LUNGS:Clear to Auscultaion, Good Breath sounds and air Entry.Normal percussion.   CVS: Heart RRR, No S4 or S3 Gallop, No Pericardial Rub.   ABD: Soft, Non Tender, No Organomegaly, Bowel Sounds normal.   BACK:  No  Spinal or CVA  tenderness.  No deformities   EXTR: Without Edema.    SKIN: Skin without rashes, NL turgor   CNS: Speech normal, alert and oriented        Laboratory:        CBC w/Diff     Lab Results         Component  Value  Date/Time            WBC  6.7  06/23/2018 06:25 AM       RBC  3.26 (L)  06/23/2018 06:25 AM       HCT  28.2 (L)  06/23/2018 06:25 AM       MCV  86.5  06/23/2018 06:25 AM       MCH  26.4  06/23/2018 06:25 AM            MCHC  30.5  06/23/2018 06:25 AM          Lab Results         Component  Value  Date/Time            MONOS  3.0  06/23/2018 06:25 AM       EOS  0.0  06/23/2018 06:25 AM            BASOS  0.0  06/23/2018 06:25 AM           Recent Labs         06/23/18   0625  06/22/18   0825      GLU  178*  124*      NA  141  140      K  6.7*  5.1      CL  115*  113*      CO2  21  19*      BUN  50*  60*      CREA  2.6*  3.7*      CA  9.4  9.2      AGAP  5  8                   Thankyou for asking me to assist in this patient's care.   Will follow.      Tommy Porteous, MD   June 23, 2018 9:04 [place].   [phone]

## 2018-06-24 LAB — RENAL FUNCTION PANEL
Albumin: 3 gm/dl — ABNORMAL LOW (ref 3.4–5.0)
Albumin: 3 gm/dl — ABNORMAL LOW (ref 3.4–5.0)
Anion Gap: 5 mmol/L (ref 5–15)
Anion gap: 5 mmol/L (ref 5–15)
BUN: 39 mg/dl — ABNORMAL HIGH (ref 7–25)
BUN: 39 mg/dl — ABNORMAL HIGH (ref 7–25)
CO2: 23 mEq/L (ref 21–32)
CO2: 23 mEq/L (ref 21–32)
Calcium: 9.4 mg/dl (ref 8.5–10.1)
Calcium: 9.4 mg/dl (ref 8.5–10.1)
Chloride: 115 mEq/L — ABNORMAL HIGH (ref 98–107)
Chloride: 115 mEq/L — ABNORMAL HIGH (ref 98–107)
Creatinine: 2 mg/dl — ABNORMAL HIGH (ref 0.6–1.3)
Creatinine: 2 mg/dl — ABNORMAL HIGH (ref 0.6–1.3)
EGFR IF NonAfrican American: 35
GFR African American: 42
GFR est AA: 42
GFR est non-AA: 35
Glucose: 111 mg/dl — ABNORMAL HIGH (ref 74–106)
Glucose: 111 mg/dl — ABNORMAL HIGH (ref 74–106)
Phosphorus: 4 mg/dl (ref 2.5–4.9)
Phosphorus: 4 mg/dl (ref 2.5–4.9)
Potassium: 6.6 mEq/L (ref 3.5–5.1)
Potassium: 6.6 mEq/L — CR (ref 3.5–5.1)
Sodium: 143 mEq/L (ref 136–145)
Sodium: 143 mEq/L (ref 136–145)

## 2018-06-24 LAB — CBC WITH AUTOMATED DIFF
BASOPHILS: 0.1 % (ref 0–3)
EOSINOPHILS: 0.8 % (ref 0–5)
HCT: 28.9 % — ABNORMAL LOW (ref 37.0–50.0)
HGB: 8.6 gm/dl — ABNORMAL LOW (ref 12.4–17.2)
IMMATURE GRANULOCYTES: 0.6 % (ref 0.0–3.0)
LYMPHOCYTES: 21 % — ABNORMAL LOW (ref 28–48)
MCH: 26.1 pg (ref 23.0–34.6)
MCHC: 29.8 gm/dl — ABNORMAL LOW (ref 30.0–36.0)
MCV: 87.6 fL (ref 80.0–98.0)
MONOCYTES: 5 % (ref 1–13)
MPV: 11.8 fL — ABNORMAL HIGH (ref 6.0–10.0)
NEUTROPHILS: 72.5 % — ABNORMAL HIGH (ref 34–64)
NRBC: 0 (ref 0–0)
PLATELET: 282 10*3/uL (ref 140–450)
RBC: 3.3 M/uL — ABNORMAL LOW (ref 3.80–5.70)
RDW-SD: 53.5 — ABNORMAL HIGH (ref 35.1–43.9)
WBC: 8 10*3/uL (ref 4.0–11.0)

## 2018-06-24 LAB — POTASSIUM
Potassium: 4.4 mEq/L (ref 3.5–5.1)
Potassium: 4.4 mEq/L (ref 3.5–5.1)

## 2018-06-24 LAB — CULTURE, URINE
CULTURE RESULT: NO GROWTH
Culture result: NO GROWTH

## 2018-06-24 LAB — CBC WITH AUTO DIFFERENTIAL
Basophils %: 0.1 % (ref 0–3)
Eosinophils %: 0.8 % (ref 0–5)
Hematocrit: 28.9 % — ABNORMAL LOW (ref 37.0–50.0)
Hemoglobin: 8.6 gm/dl — ABNORMAL LOW (ref 12.4–17.2)
Immature Granulocytes: 0.6 % (ref 0.0–3.0)
Lymphocytes %: 21 % — ABNORMAL LOW (ref 28–48)
MCH: 26.1 pg (ref 23.0–34.6)
MCHC: 29.8 gm/dl — ABNORMAL LOW (ref 30.0–36.0)
MCV: 87.6 fL (ref 80.0–98.0)
MPV: 11.8 fL — ABNORMAL HIGH (ref 6.0–10.0)
Monocytes %: 5 % (ref 1–13)
Neutrophils %: 72.5 % — ABNORMAL HIGH (ref 34–64)
Nucleated RBCs: 0 (ref 0–0)
Platelets: 282 10*3/uL (ref 140–450)
RBC: 3.3 M/uL — ABNORMAL LOW (ref 3.80–5.70)
RDW-SD: 53.5 — ABNORMAL HIGH (ref 35.1–43.9)
WBC: 8 10*3/uL (ref 4.0–11.0)

## 2018-06-24 MED ORDER — PROMETHAZINE IN NS 12.5 MG/50 ML IV PIGGY BAG
12.5 mg/50 ml | Freq: Once | INTRAVENOUS | Status: AC
Start: 2018-06-24 — End: 2018-06-24
  Administered 2018-06-24: 08:00:00 via INTRAVENOUS

## 2018-06-24 MED ORDER — FAMOTIDINE (PF) 20 MG/2 ML IV
202 mg/2 mL | Freq: Every day | INTRAVENOUS | Status: DC
Start: 2018-06-24 — End: 2018-06-26
  Administered 2018-06-25 – 2018-06-26 (×2): via INTRAVENOUS

## 2018-06-24 MED ORDER — MELATONIN 3 MG TAB
3 mg | Freq: Once | ORAL | Status: AC
Start: 2018-06-24 — End: 2018-06-24
  Administered 2018-06-24: 04:00:00 via ORAL

## 2018-06-24 MED ORDER — PATIROMER CALCIUM SORBITEX 8.4 GRAM ORAL POWDER PACKET
8.4 gram | Freq: Every day | ORAL | Status: DC
Start: 2018-06-24 — End: 2018-06-24

## 2018-06-24 MED ORDER — FUROSEMIDE 10 MG/ML IJ SOLN
10 mg/mL | Freq: Once | INTRAMUSCULAR | Status: AC
Start: 2018-06-24 — End: 2018-06-24
  Administered 2018-06-24: 15:00:00 via INTRAVENOUS

## 2018-06-24 MED ORDER — CALCIUM GLUCONATE 1 GRAM/50 ML IN SODIUM CHLORIDE, ISO-OSM IV SOLUTION
1 gram/50 mL | Freq: Once | INTRAVENOUS | Status: AC
Start: 2018-06-24 — End: 2018-06-24
  Administered 2018-06-24: 13:00:00 via INTRAVENOUS

## 2018-06-24 MED ORDER — PROMETHAZINE IN NS 12.5 MG/50 ML IV PIGGY BAG
12.5 mg/50 ml | Freq: Four times a day (QID) | INTRAVENOUS | Status: DC | PRN
Start: 2018-06-24 — End: 2018-06-26
  Administered 2018-06-24 – 2018-06-25 (×3): via INTRAVENOUS

## 2018-06-24 MED ORDER — PATIROMER CALCIUM SORBITEX 8.4 GRAM ORAL POWDER PACKET
8.4 gram | Freq: Every day | ORAL | Status: DC
Start: 2018-06-24 — End: 2018-06-25
  Administered 2018-06-25: 13:00:00 via ORAL

## 2018-06-24 MED ORDER — INSULIN REGULAR HUMAN 100 UNIT/ML INJECTION
100 unit/mL | Freq: Once | INTRAMUSCULAR | Status: AC
Start: 2018-06-24 — End: 2018-06-24
  Administered 2018-06-24: 12:00:00 via INTRAVENOUS

## 2018-06-24 MED ORDER — LIDOCAINE 2 % MUCOUS MEMBRANE JELLY IN APPLICATOR
2 % | Freq: Once | Status: AC | PRN
Start: 2018-06-24 — End: 2018-06-25

## 2018-06-24 MED ORDER — DEXTROSE 50% IN WATER (D50W) IV SYRG
Freq: Once | INTRAVENOUS | Status: AC
Start: 2018-06-24 — End: 2018-06-24
  Administered 2018-06-24: 12:00:00 via INTRAVENOUS

## 2018-06-24 MED ORDER — SODIUM CHLORIDE 0.9 % INJECTION
202 mg/2 mL | Freq: Two times a day (BID) | INTRAMUSCULAR | Status: DC
Start: 2018-06-24 — End: 2018-06-24
  Administered 2018-06-24: 12:00:00 via INTRAVENOUS

## 2018-06-24 MED ORDER — PATIROMER CALCIUM SORBITEX 8.4 GRAM ORAL POWDER PACKET
8.4 gram | Freq: Once | ORAL | Status: AC
Start: 2018-06-24 — End: 2018-06-24
  Administered 2018-06-24: 12:00:00 via ORAL

## 2018-06-24 MED FILL — VELTASSA 8.4 GRAM ORAL POWDER PACKET: 8.4 gram | ORAL | Qty: 1

## 2018-06-24 MED FILL — HEPARIN (PORCINE) 5,000 UNIT/ML IJ SOLN: 5000 unit/mL | INTRAMUSCULAR | Qty: 1

## 2018-06-24 MED FILL — ONDANSETRON (PF) 4 MG/2 ML INJECTION: 4 mg/2 mL | INTRAMUSCULAR | Qty: 2

## 2018-06-24 MED FILL — POLYVINYL ALCOHOL-POVIDONE 1.4 %-0.6 % EYE DROPPERETTE: OPHTHALMIC | Qty: 1

## 2018-06-24 MED FILL — METOPROLOL SUCCINATE SR 25 MG 24 HR TAB: 25 mg | ORAL | Qty: 1

## 2018-06-24 MED FILL — MELATONIN 3 MG TAB: 3 mg | ORAL | Qty: 1

## 2018-06-24 MED FILL — DEXTROSE 50% IN WATER (D50W) IV SYRG: INTRAVENOUS | Qty: 50

## 2018-06-24 MED FILL — CALCIUM GLUCONATE 1 GRAM/50 ML IN SODIUM CHLORIDE, ISO-OSM IV SOLUTION: 1 gram/50 mL | INTRAVENOUS | Qty: 50

## 2018-06-24 MED FILL — TAMSULOSIN SR 0.4 MG 24 HR CAP: 0.4 mg | ORAL | Qty: 1

## 2018-06-24 MED FILL — PROMETHAZINE IN NS 12.5 MG/50 ML IV PIGGY BAG: 12.5 mg/50 ml | INTRAVENOUS | Qty: 50

## 2018-06-24 MED FILL — CEFTRIAXONE 1 GRAM SOLUTION FOR INJECTION: 1 gram | INTRAMUSCULAR | Qty: 1

## 2018-06-24 MED FILL — LIDOCAINE 2 % MUCOUS MEMBRANE JELLY IN APPLICATOR: 2 % | Qty: 5

## 2018-06-24 MED FILL — ASPIRIN 81 MG CHEWABLE TAB: 81 mg | ORAL | Qty: 1

## 2018-06-24 MED FILL — AMLODIPINE 5 MG TAB: 5 mg | ORAL | Qty: 1

## 2018-06-24 MED FILL — FUROSEMIDE 10 MG/ML IJ SOLN: 10 mg/mL | INTRAMUSCULAR | Qty: 4

## 2018-06-24 NOTE — Progress Notes (Signed)
Renal Progress Note     Patient: Tommy Brown Age: 73 y.o. Sex: male    Date of Birth: 04/23/1945 Admit Date: 06/22/2018 PCP: Daralene Milch, MD   MRN: [id number]  CSN: [id number]         Assessment   1. Acute kidney injury.  Getting better with good UOP.  2. Hyperkalemia.  Again this is hard to explain with the improving renal function and normal UOP.  Consider that the heparin (due to interference with aldosterone) or the beta blocker (prevents cellular shifting) are to blame.  3. Postop day 2 status post left ureteral stenting due to obstructing stone    Plan   1. Agree with medical RX and Lokelma.  2. Add lasix 40 mg iv once.  3. Repeat labs ordered.  4. Stop heparin and place SCD's    Subjective   No SOB, CP.  Had some nausea yesterday evening.    Review of Systems   No fever, chills  No chest pain, palpitations  No SOB, cough, hemoptysis  No abdominal pain, no vomiting/diarrhea/constipation.  Positive for nausea  Urinating without difficulty, no dysuria, no hematuria  No LE edema  No anxiety or depression     Objective     Visit Vitals  BP 161/89 (BP 1 Location: Left arm, BP Patient Position: Sitting)   Pulse 87   Temp 97.3 ??F (36.3 ??C)   Resp 18   Ht 5\' 8"  (1.727 m)   Wt 70.8 kg (156 lb 1.4 oz)   SpO2 99%   BMI 23.73 kg/m??       Intake/Output Summary (Last 24 hours) at 06/24/2018 0951  Last data filed at 06/24/2018 0554  Gross per 24 hour   Intake 2661.67 ml   Output 2300 ml   Net 361.67 ml       Physical Assessment   EXH:BZJI, NAD  EYES: sclera clear  Neck:  Without JVD or adenopathy  LUNGS:Clear to Auscultation, No Rales or Rhonchi.Good Air Entry  CVS EXM: Heart RRR, No S4 or S3 Gallop, No Pericardial Rub.  Abdomen: Soft, Non Tender, No CVA tenderness. Bowel Sounds normal.  Extremities: No Pitting Edema. No deformities  SKIN:No Rashes,Normal Turgor  Neurological exam:Awake alert and conversant.     Medications     Current Facility-Administered Medications   Medication Dose Route Frequency    ??? [START ON 06/25/2018] patiromer calcium sorbitex (VELTASSA) powder 8.4 g  8.4 g Oral DAILY   ??? furosemide (LASIX) injection 40 mg  40 mg IntraVENous ONCE   ??? [START ON 06/25/2018] famotidine (PF) (PEPCID) 20 mg in 0.9% sodium chloride 10 mL injection  20 mg IntraVENous DAILY   ??? polyvinyl alcohol-povidon(PF) (REFRESH CLASSIC) 1.4-0.6 % ophthalmic solution 2 Drop  2 Drop Both Eyes Q6H PRN   ??? cefTRIAXone (ROCEPHIN) 1 g in 0.9% sodium chloride (MBP/ADV) 50 mL MBP  1 g IntraVENous Q24H   ??? 0.9% sodium chloride infusion  100 mL/hr IntraVENous CONTINUOUS   ??? amLODIPine (NORVASC) tablet 5 mg  5 mg Oral DAILY   ??? aspirin chewable tablet 81 mg  81 mg Oral DAILY   ??? metoprolol succinate (TOPROL-XL) XL tablet 25 mg  25 mg Oral DAILY   ??? tamsulosin (FLOMAX) capsule 0.4 mg  0.4 mg Oral DAILY   ??? naloxone (NARCAN) injection 0.1 mg  0.1 mg IntraVENous PRN   ??? acetaminophen (TYLENOL) tablet 650 mg  650 mg Oral Q4H PRN    Or   ??? acetaminophen (TYLENOL)  solution 650 mg  650 mg Oral Q4H PRN    Or   ??? acetaminophen (TYLENOL) suppository 650 mg  650 mg Rectal Q4H PRN   ??? ondansetron (ZOFRAN) injection 4 mg  4 mg IntraVENous Q4H PRN   ??? polyethylene glycol (MIRALAX) packet 17 g  17 g Oral DAILY PRN         Laboratory:     CBC w/Diff   Recent Labs     06/24/18  0313 06/23/18  0625 06/22/18  0825   WBC 8.0 6.7 7.4   RBC 3.30* 3.26* 3.44*   HGB 8.6* 8.6* 9.2*   HCT 28.9* 28.2* 29.9*   PLT 282 256 276        Recent Labs     06/24/18  0313 06/23/18  0902 06/23/18  0625 06/22/18  0825   GLU 111*  --  178* 124*   NA 143  --  141 140   K 6.6* 5.4* 6.7* 5.1   CL 115*  --  115* 113*   CO2 23  --  21 19*   BUN 39*  --  50* 60*   CREA 2.0*  --  2.6* 3.7*   CA 9.4  --  9.4 9.2   AGAP 5  --  5 8   ALB 3.0*  --   --   --         Lab Results   Component Value Date/Time    Calcium 9.4 06/24/2018 03:13 AM    Phosphorus 4.0 06/24/2018 03:13 AM         Horace Porteous, MD  June 24, 2018 9:51 AM  [place],  [phone]

## 2018-06-24 NOTE — Other (Signed)
Bedside shift change report given to Temitope A. Marcello Moores, RN (Soil scientist) by Corrie Dandy (offgoing nurse). Report included the following information SBAR, Kardex and Cardiac Rhythm SR  e.

## 2018-06-24 NOTE — Progress Notes (Signed)
Patient in room Tommy Brown has a potassium of 6.6.Ronal Fear RN  Ext [phone]

## 2018-06-24 NOTE — Progress Notes (Addendum)
Hospitalist Progress Note    Patient: Tommy Brown               Sex: male          DOA: 06/22/2018       Date of Birth:  October 13, 1945      Age:  73 y.o.        LOS:  LOS: 2 days     PCP: Daralene Milch, MD   Assessment and Plan:    Assessment  Recurrent acute hyperkalemia  Acute kidney injury,??suspect due to obstructive uropathy, NSAIDs use  Left-sided hydronephrosis due to obstructing stone s/p left J stent on 06/22/2018  Nephrolithiasis  Metabolic acidosis, resolved.  Hypertension  Coronary artery disease, history of CABG  Pancreatic cancer history  BPH  Hyperlipidemia  ??  Plan  Patient's potassium brought down to 5.4 yesterday and again 6.6 today.  Patient is receiving insulin, dextrose, continue on telemetry.  Patient complains of nausea abdominal discomfort overnight.  Zofran, add IV Pepcid.  Patient is on IV Rocephin for E. coli UTI, will switch to oral antibiotic once GI symptoms better and able to tolerate by mouth.  Patient borderline high, resume amlodipine, continue metoprolol  Foley catheter in place, plan to remove when renal function normalizes.  Renal function steadily improving  Nephrology on board.  IV hydration.  Hold Gabapentin  Low K diet.  DVT ppx      I was able to get in contact with the wife and discussed the case in detail.  Name Tommy Brown, phone [phone].   Patient stated that if she has problems with the phone that always goes to voicemail.    Subjective: Nausea overnight, little better today.  Denies abdominal pain, chills.     [name] is a 73 y.o. year old male who is being seen for obstructive uropathy, hyperkalemia, renal failure    Objective:      Vital Signs:  Visit Vitals  BP 149/72 (BP 1 Location: Left arm, BP Patient Position: Supine)   Pulse 70   Temp 97.5 ??F (36.4 ??C)   Resp 18   Ht 5\' 8"  (1.727 m)   Wt 70.8 kg (156 lb 1.4 oz)   SpO2 97%   BMI 23.73 kg/m??     Physical Exam:  GENERAL: Alert and oriented times 3.  HEAD: Head normocephalic, atraumatic    Eyes: Conjunctivae pink, anicteric  CARDIOVASCULAR: S1 and S2 heard, murmur  RESPIRATORY: Effort normal. no crackles. no ronchi  ABDOMEN: Soft, nontender, nondistended. Normal bowel present.   NEUROLOGIC: Alert and oriented times 3. Able to move all 4 extremities.   PSYCHIATRIC: Normal affect.    Intake and Output:  Last three shifts:  04/27 1901 - 04/29 0700  In: 3996 [P.O.:360; I.V.:3636]  Out: 3600 [Urine:3600]    Lab Results:  Recent Results (from the past 24 hour(s))   GLUCOSE, POC    Collection Time: 06/23/18  8:58 AM   Result Value Ref Range    Glucose (POC) 224 (H) 65 - 105 mg/dL   POTASSIUM    Collection Time: 06/23/18  9:02 AM   Result Value Ref Range    Potassium 5.4 (H) 3.5 - 5.1 mEq/L   GLUCOSE, POC    Collection Time: 06/23/18  9:46 AM   Result Value Ref Range    Glucose (POC) 402 (HH) 65 - 105 mg/dL   GLUCOSE, POC    Collection Time: 06/23/18  3:38 PM   Result  Value Ref Range    Glucose (POC) 110 (H) 65 - 105 mg/dL   CBC WITH AUTOMATED DIFF    Collection Time: 06/24/18  3:13 AM   Result Value Ref Range    WBC 8.0 4.0 - 11.0 1000/mm3    RBC 3.30 (L) 3.80 - 5.70 M/uL    HGB 8.6 (L) 12.4 - 17.2 gm/dl    HCT 28.9 (L) 37.0 - 50.0 %    MCV 87.6 80.0 - 98.0 fL    MCH 26.1 23.0 - 34.6 pg    MCHC 29.8 (L) 30.0 - 36.0 gm/dl    PLATELET 282 140 - 450 1000/mm3    MPV 11.8 (H) 6.0 - 10.0 fL    RDW-SD 53.5 (H) 35.1 - 43.9      NRBC 0 0 - 0      IMMATURE GRANULOCYTES 0.6 0.0 - 3.0 %    NEUTROPHILS 72.5 (H) 34 - 64 %    LYMPHOCYTES 21.0 (L) 28 - 48 %    MONOCYTES 5.0 1 - 13 %    EOSINOPHILS 0.8 0 - 5 %    BASOPHILS 0.1 0 - 3 %   RENAL FUNCTION PANEL    Collection Time: 06/24/18  3:13 AM   Result Value Ref Range    Sodium 143 136 - 145 mEq/L    Potassium 6.6 (HH) 3.5 - 5.1 mEq/L    Chloride 115 (H) 98 - 107 mEq/L    CO2 23 21 - 32 mEq/L    Glucose 111 (H) 74 - 106 mg/dl    BUN 39 (H) 7 - 25 mg/dl    Creatinine 2.0 (H) 0.6 - 1.3 mg/dl    GFR est AA 42.0      GFR est non-AA 35      Calcium 9.4 8.5 - 10.1 mg/dl     Albumin 3.0 (L) 3.4 - 5.0 gm/dl    Phosphorus 4.0 2.5 - 4.9 mg/dl    Anion gap 5 5 - 15 mmol/L      Medications:  Current Facility-Administered Medications   Medication Dose Route Frequency   ??? insulin regular (NOVOLIN R, HUMULIN R) injection 10 Units  10 Units IntraVENous ONCE   ??? calcium gluconate 1 gram in sodium chloride (ISO-OSM) 50 mL infusion  1 g IntraVENous ONCE   ??? patiromer calcium sorbitex (VELTASSA) powder 8.4 g  8.4 g Oral ONCE   ??? dextrose (D50W) injection syrg 25 g  25 g IntraVENous ONCE   ??? famotidine (PF) (PEPCID) 20 mg in 0.9% sodium chloride 10 mL injection  20 mg IntraVENous Q12H   ??? polyvinyl alcohol-povidon(PF) (REFRESH CLASSIC) 1.4-0.6 % ophthalmic solution 2 Drop  2 Drop Both Eyes Q6H PRN   ??? heparin (porcine) injection 5,000 Units  5,000 Units SubCUTAneous Q12H   ??? cefTRIAXone (ROCEPHIN) 1 g in 0.9% sodium chloride (MBP/ADV) 50 mL MBP  1 g IntraVENous Q24H   ??? 0.9% sodium chloride infusion  100 mL/hr IntraVENous CONTINUOUS   ??? amLODIPine (NORVASC) tablet 5 mg  5 mg Oral DAILY   ??? aspirin chewable tablet 81 mg  81 mg Oral DAILY   ??? metoprolol succinate (TOPROL-XL) XL tablet 25 mg  25 mg Oral DAILY   ??? tamsulosin (FLOMAX) capsule 0.4 mg  0.4 mg Oral DAILY   ??? naloxone (NARCAN) injection 0.1 mg  0.1 mg IntraVENous PRN   ??? acetaminophen (TYLENOL) tablet 650 mg  650 mg Oral Q4H PRN    Or   ??? acetaminophen (TYLENOL)  solution 650 mg  650 mg Oral Q4H PRN    Or   ??? acetaminophen (TYLENOL) suppository 650 mg  650 mg Rectal Q4H PRN   ??? ondansetron (ZOFRAN) injection 4 mg  4 mg IntraVENous Q4H PRN   ??? polyethylene glycol (MIRALAX) packet 17 g  17 g Oral DAILY PRN       Dragon dictation software was used for portions of this report. Unintended errors in transcription may occur.  Ed[place] House, MD  June 24, 2018  7:38 AM

## 2018-06-24 NOTE — Progress Notes (Signed)
Patient in room [place] has nausea. I have given him two doses of Zofran IV with no help. Had received Patiromer during the day for hyperkalemia. Could you prescribe another antiemetic maybe promethazine?Ronal Fear RN  Ext [phone]

## 2018-06-24 NOTE — Progress Notes (Addendum)
Urology Progress Note        Assessment/Plan:     Patient Active Problem List   Diagnosis Code   ??? Sepsis ([place]) A41.9   ??? AKI (acute kidney injury) ([place]) N17.9       ASSESSMENT:??  - Left 72mm proximal ureteral stone with hydroureteronephrosis               - s/p cystoscopy, left JJ stent on 06/22/2018 by Dr. Claiborne Billings   - AKI with baseline creatinine 1.5  ????????????????????????- Peak creatinine 3.7 (4/27)  ????????????????????????- Current creatinine 2.0  ????????????????????????- Likely due in combination of large Toradol in take and obstructing stone   - Hyperkalemia - currently K is 6.7   - Hx of nephrolithiasis s/p URS with LL in 02/2017  - Hx of ED with IPP  - Hx of Pancreatic cancer s/p [place] in 2006, chemotherapy and radiation??  ??  ??  Plan:    - Patient is aware JJ stent is temporary and will need removal or exchange in near future, no later than 3 months from now. Patient is aware will need definitive stone treatment outpatient in near future. He would like his wife to be able to be with him during procedure   - Urine culture 4/27 x2 - one showing 30k E.coli, the other intraop showed no growth. Antibiotics as per primary team - currently on Rocephin which is sensitive   - Maintain foley catheter until creatinine nadir - placed during OR procedure for renal drainage to help AKI - will defer removal to primary team and nephrology   - Continue Flomax   - Will sign off at this time. Please call with any questions or concerns.   ??  Follow up arranged? Yes - message sent to Danella Penton to arrange follow up with Dr. Donovan Kail in 2 weeks   ??  Heather L Schaubach, PA-C  ??  [phone]    I agree with the history, physical, assessment and plan as documented above. Additionally, I have addended the note above (or below) as needed in order to reflect my additional findings.    Teodora Medici, MD  Assistant Professor, Urology  [facility]    Subjective:      Daily Progress Note: 06/24/2018 8:36 AM    Tommy Brown is doing well. He reports pain is absent. He has no new complaints. He is tolerating a solid diet and ambulating with assistance. Indwelling catheter is draining well.    Objective:     Visit Vitals  BP 161/89 (BP 1 Location: Left arm, BP Patient Position: Sitting)   Pulse 87   Temp 97.3 ??F (36.3 ??C)   Resp 18   Ht 5\' 8"  (1.727 m)   Wt 70.8 kg (156 lb 1.4 oz)   SpO2 99%   BMI 23.73 kg/m??        Temp (24hrs), Avg:97.8 ??F (36.6 ??C), Min:97.3 ??F (36.3 ??C), Max:98.1 ??F (36.7 ??C)      Intake and Output:  04/27 1901 - 04/29 0700  In: 3996 [P.O.:360; I.V.:3636]  Out: 3600 [Urine:3600]  No intake/output data recorded.    PHYSICAL EXAMINATION:   Visit Vitals  BP 161/89 (BP 1 Location: Left arm, BP Patient Position: Sitting)   Pulse 87   Temp 97.3 ??F (36.3 ??C)   Resp 18   Ht 5\' 8"  (1.727 m)   Wt 70.8 kg (156 lb 1.4 oz)   SpO2 99%  BMI 23.73 kg/m??     Constitutional: Well developed, well nourished.  No acute distress.    HEENT: Normocephalic, Atraumatic, EOM's intact   Respiratory: No respiratory distress or difficulties breathing   GU Male:   Foley: draining well of yellow urine   Skin: No evidence of jaundice.  Normal color  Neuro/Psych:  Alert and oriented. Affect appropriate.   ??  Lab/Data Review:  All lab results for the last 24 hours reviewed.     Labs:     Labs: Results:   Chemistry    Recent Labs     06/24/18  0313 06/23/18  0902 06/23/18  0625 06/22/18  0825   GLU 111*  --  178* 124*   NA 143  --  141 140   K 6.6* 5.4* 6.7* 5.1   CL 115*  --  115* 113*   CO2 23  --  21 19*   BUN 39*  --  50* 60*   CREA 2.0*  --  2.6* 3.7*   CA 9.4  --  9.4 9.2   AGAP 5  --  5 8   ALB 3.0*  --   --   --       CBC w/Diff Recent Labs     06/24/18  0313 06/23/18  0625 06/22/18  0825   WBC 8.0 6.7 7.4   RBC 3.30* 3.26* 3.44*   HGB 8.6* 8.6* 9.2*   HCT 28.9* 28.2* 29.9*   PLT 282 256 276   GRANS 72.5* 85.2* 73.1*   LYMPH 21.0* 11.2* 17.9*   EOS 0.8 0.0 1.5       Cultures No results for input(s): CULT in the last 72 hours.  All Micro Results     Procedure Component Value Units Date/Time    CULTURE, URINE [[id number]]  (Abnormal)  (Susceptibility) Collected:  06/22/18 0915    Order Status:  Completed Specimen:  Urine from Clean catch Updated:  06/23/18 1716     Isolate --        30,000 CFU/mL  Escherichia coli      CULTURE, URINE [[id number]] Collected:  06/22/18 1643    Order Status:  Completed Specimen:  Urine from Bladder Updated:  06/23/18 0940     Culture result No Growth To Date               Urinalysis Color   Date Value Ref Range Status   06/22/2018 YELLOW YELLOW,STRAW   Final     Appearance   Date Value Ref Range Status   06/22/2018 CLEAR CLEAR   Final     Specific gravity   Date Value Ref Range Status   06/22/2018 1.025 1.005 - 1.030   Final     pH (UA)   Date Value Ref Range Status   06/22/2018 5.5 5.0 - 9.0   Final     Protein   Date Value Ref Range Status   06/22/2018 NEGATIVE NEGATIVE,Negative mg/dl Final     Ketone   Date Value Ref Range Status   06/22/2018 NEGATIVE NEGATIVE,Negative mg/dl Final     Bilirubin   Date Value Ref Range Status   06/22/2018 NEGATIVE NEGATIVE,Negative   Final     Blood   Date Value Ref Range Status   06/22/2018 NEGATIVE NEGATIVE,Negative   Final     Urobilinogen   Date Value Ref Range Status   06/22/2018 0.2 0.0 - 1.0 mg/dl Final     Nitrites   Date Value Ref Range  Status   06/22/2018 NEGATIVE NEGATIVE,Negative   Final     Leukocyte Esterase   Date Value Ref Range Status   06/22/2018 NEGATIVE NEGATIVE,Negative   Final     Potassium   Date Value Ref Range Status   06/24/2018 6.6 (HH) 3.5 - 5.1 mEq/L Final     Comment:     THE FOLLOWING TEST HAS BEEN DETERMINED TO HAVE A CRITICAL VALUE.  THE  _K  RESULT WAS CALLED TO (AND READ BACK BY)  Tommy Brown             THE NOTIFICATION WAS MADE ON _04/29/20@0614    BY _rle       Creatinine   Date Value Ref Range Status   06/24/2018 2.0 (H) 0.6 - 1.3 mg/dl Final     BUN   Date Value Ref Range Status    06/24/2018 39 (H) 7 - 25 mg/dl Final     Prostate Specific Ag   Date Value Ref Range Status   04/30/2018 0.6 0.0 - 4.0 ng/mL Final      PSA No results for input(s): PSA in the last 72 hours.   Coagulation No results found for: PTP, INR, APTT, INREXT

## 2018-06-24 NOTE — Other (Deleted)
PHYSICIAN???S DOCUMENTATION REQUEST   This Form is Not a Permanent Document in the Medical Record     We are seeking further clarification of documentation. Your independent clinical judgment is appreciated.      Dear Dr. Robie Ridge,    Clarification of CKD (if present) and Stage is needed.    Chronic Kidney Disease is listed in PMH.    Creatinine and GFR results:    Date: 4/27 Creatinine 3.7 GFR 17    Date: 4/28 Creatinine 2.6 GFR 26    Date: 4/29 Creatinine 2.0 GFR 35  -------------------------------------------------------------------------------------------------------------  Please include the Stage of CKD:    Stage of CKD per [place]      ? Stage 3 GFR 30-59    ? Stage 4 GFR 15-29    ? Stage 5 GFR ? 15    ? Unable to determine Stage of CKD    ? Other,  ____________________    Please include your response in future Progress Notes and in the Discharge Summary.    Thank you,  Fara Chute  [phone]

## 2018-06-24 NOTE — Progress Notes (Signed)
Progress Notes by Teodora Medici, MD at 06/24/18 [phone]                Author: Teodora Medici, MD  Service: Urology  Author Type: Physician       Filed: 06/24/18 1347  Date of Service: 06/24/18 0835  Status: Addendum          Editor: Teodora Medici, MD (Physician)          Related Notes: Original Note by Simonne Maffucci (Physician Assistant) filed at 06/24/18  1150                          Urology Progress Note              Assessment/Plan:          Patient Active Problem List        Diagnosis  Code         ?  Sepsis ([place])  A41.9         ?  AKI (acute kidney injury) ([place])  N17.9           ASSESSMENT:??   - Left 67mm proximal ureteral stone with hydroureteronephrosis                - s/p cystoscopy, left JJ stent on 06/22/2018 by Dr. Claiborne Billings    - AKI with baseline creatinine 1.5   ????????????????????????- Peak creatinine 3.7 (4/27)   ????????????????????????- Current creatinine 2.0   ????????????????????????- Likely due in combination of large Toradol in take and obstructing stone    - Hyperkalemia - currently K is 6.7    - Hx of nephrolithiasis s/p URS with LL in 02/2017   - Hx of ED with IPP   - Hx of Pancreatic cancer s/p [facility] in 2006, chemotherapy and radiation??   ??   ??   Plan:     - Patient is aware JJ stent is temporary and will need removal or exchange in near future, no later than 3 months from now. Patient is aware will need definitive stone treatment outpatient in near future. He would like his wife to be able to be with  him during procedure    - Urine culture 4/27 x2 - one showing 30k E.coli, the other intraop showed no growth. Antibiotics as per primary team - currently on Rocephin which is sensitive    - Maintain foley catheter until creatinine nadir - placed during OR procedure for renal drainage to help AKI - will defer removal to primary team and nephrology    - Continue Flomax    - Will sign off at this time. Please call with any questions or concerns.    ??   Follow up arranged? Yes - message sent to Danella Penton to arrange follow up with Dr. Donovan Kail in 2 weeks    ??   Heather L Schaubach, PA-C   ??   [phone]      I agree with the history, physical, assessment and plan as documented above. Additionally, I have addended the note above (or below) as needed in order to reflect my additional findings.      Teodora Medici, MD   Assistant Professor, Urology   [facility]        Subjective:        Daily Progress Note: 06/24/2018 8:36 AM      Tommy Brown  Oralia Rud Sr is Brown well. He  reports pain is absent. He has  no new complaints. He is tolerating a solid diet  and ambulating with assistance. Indwelling catheter is draining well.        Objective:        Visit Vitals      BP  161/89 (BP 1 Location: Left arm, BP Patient Position: Sitting)     Pulse  87     Temp  97.3 ??F (36.3 ??C)     Resp  18     Ht  5\' 8"  (1.727 m)     Wt  70.8 kg (156 lb 1.4 oz)     SpO2  99%        BMI  23.73 kg/m??            Temp (24hrs), Avg:97.8 ??F (36.6 ??C), Min:97.3 ??F (36.3 ??C), Max:98.1 ??F (36.7 ??C)         Intake and Output:   04/27 1901 - 04/29 0700   In: 3996 [P.O.:360; I.V.:3636]   Out: 3600 [Urine:3600]   No intake/output data recorded.      PHYSICAL EXAMINATION:    Visit Vitals      BP  161/89 (BP 1 Location: Left arm, BP Patient Position: Sitting)     Pulse  87     Temp  97.3 ??F (36.3 ??C)     Resp  18     Ht  5\' 8"  (1.727 m)     Wt  70.8 kg (156 lb 1.4 oz)     SpO2  99%        BMI  23.73 kg/m??        Constitutional: Well developed, well nourished.  No acute distress.     HEENT: Normocephalic, Atraumatic, EOM's intact    Respiratory: No respiratory distress or difficulties breathing    GU Male:   Foley:  draining well of yellow urine    Skin: No evidence of jaundice.  Normal color   Neuro/Psych:  Alert and oriented. Affect appropriate.    ??   Lab/Data Review:   All lab results for the last 24 hours reviewed.       Labs:          Labs:  Results:        Chemistry      Recent Labs          06/24/18   0313  06/23/18   0902  06/23/18   0625  06/22/18   0825      GLU  111*   --   178*  124*      NA  143   --   141  140      K  6.6*  5.4*  6.7*  5.1      CL  115*   --   115*  113*      CO2  23   --   21  19*      BUN  39*   --   50*  60*      CREA  2.0*   --   2.6*  3.7*      CA  9.4   --   9.4  9.2      AGAP  5   --   5  8      ALB  3.0*   --    --    --  CBC w/Diff  Recent Labs         06/24/18   0313  06/23/18   0625  06/22/18   0825      WBC  8.0  6.7  7.4      RBC  3.30*  3.26*  3.44*      HGB  8.6*  8.6*  9.2*      HCT  28.9*  28.2*  29.9*      PLT  282  256  276      GRANS  72.5*  85.2*  73.1*      LYMPH  21.0*  11.2*  17.9*      EOS  0.8  0.0  1.5           Cultures  No results for input(s): CULT in the last 72 hours.   All Micro Results         Procedure  Component  Value  Units  Date/Time        CULTURE, URINE [[id number]]  (Abnormal)  (Susceptibility)  Collected:  06/22/18 0915        Order Status:  Completed  Specimen:  Urine from Clean catch  Updated:  06/23/18 1716          Isolate  --                 30,000 CFU/mL   Escherichia coli             CULTURE, URINE [[id number]]  Collected:  06/22/18 1643        Order Status:  Completed  Specimen:  Urine from Bladder  Updated:  06/23/18 0940          Culture result  No Growth To Date                               Urinalysis  Color      Date  Value  Ref Range  Status      06/22/2018  YELLOW  YELLOW,STRAW    Final            Appearance      Date  Value  Ref Range  Status      06/22/2018  CLEAR  CLEAR    Final            Specific gravity      Date  Value  Ref Range  Status      06/22/2018  1.025  1.005 - 1.030    Final            pH (UA)      Date  Value  Ref Range  Status      06/22/2018  5.5  5.0 - 9.0    Final            Protein      Date  Value  Ref Range  Status      06/22/2018  NEGATIVE  NEGATIVE,Negative mg/dl  Final            Ketone      Date  Value  Ref Range  Status      06/22/2018  NEGATIVE  NEGATIVE,Negative mg/dl  Final             Bilirubin      Date  Value  Ref Range  Status      06/22/2018  NEGATIVE  NEGATIVE,Negative    Final  Blood      Date  Value  Ref Range  Status      06/22/2018  NEGATIVE  NEGATIVE,Negative    Final            Urobilinogen      Date  Value  Ref Range  Status      06/22/2018  0.2  0.0 - 1.0 mg/dl  Final            Nitrites      Date  Value  Ref Range  Status      06/22/2018  NEGATIVE  NEGATIVE,Negative    Final            Leukocyte Esterase      Date  Value  Ref Range  Status      06/22/2018  NEGATIVE  NEGATIVE,Negative    Final            Potassium      Date  Value  Ref Range  Status      06/24/2018  6.6 (HH)  3.5 - 5.1 mEq/L  Final          Comment:          THE FOLLOWING TEST HAS BEEN DETERMINED TO HAVE A CRITICAL VALUE.   THE  _K  RESULT WAS CALLED TO (AND READ BACK BY)  Herbert Pun              THE NOTIFICATION WAS MADE ON _04/29/20@0614    BY _rle               Creatinine      Date  Value  Ref Range  Status      06/24/2018  2.0 (H)  0.6 - 1.3 mg/dl  Final            BUN      Date  Value  Ref Range  Status      06/24/2018  39 (H)  7 - 25 mg/dl  Final            Prostate Specific Ag      Date  Value  Ref Range  Status      04/30/2018  0.6  0.0 - 4.0 ng/mL  Final                 PSA  No results for input(s): PSA in the last 72 hours.        Coagulation  No results found for: PTP, INR, APTT, INREXT

## 2018-06-24 NOTE — Progress Notes (Signed)
Patient in room 2208 Tommy Brown has nausea. I have given him two doses of Zofran IV with no help. Had received Patiromer during the day for hyperkalemia. Could you prescribe another antiemetic maybe promethazine?Lucia Gaskins RN  Ext [phone]

## 2018-06-24 NOTE — Progress Notes (Signed)
Progress Notes by Edger House, MD at 06/24/18 [phone]                Author: Edger House, MD  Service: Hospitalist  Author Type: Physician       Filed: 06/24/18 1345  Date of Service: 06/24/18 4315  Status: Addendum          Editor: Edger House, MD (Physician)          Related Notes: Original Note by Edger House, MD (Physician) filed at 06/24/18 [phone]                       Hospitalist Progress Note      Patient: Tommy Brown               Sex: male          DOA: 06/22/2018         Date of Birth:  06-17-1945      Age:  73 y.o.        LOS:  LOS: 2 days       PCP: Daralene Milch, MD      Assessment and Plan:      Assessment   Recurrent acute hyperkalemia   Acute kidney injury,??suspect due to obstructive uropathy, NSAIDs use   Left-sided hydronephrosis due to obstructing stone s/p left J stent on 06/22/2018   Nephrolithiasis   Metabolic acidosis, resolved.   Hypertension   Coronary artery disease, history of CABG   Pancreatic cancer history   BPH   Hyperlipidemia   ??   Plan   Patient's potassium brought down to 5.4 yesterday and again 6.6 today.  Patient is receiving insulin, dextrose, continue on telemetry.   Patient complains of nausea abdominal discomfort overnight.  Zofran, add IV Pepcid.   Patient is on IV Rocephin for E. coli UTI, will switch to oral antibiotic once GI symptoms better and able to tolerate by mouth.   Patient borderline high, resume amlodipine, continue metoprolol   Foley catheter in place, plan to remove when renal function normalizes.   Renal function steadily improving   Nephrology on board.   IV hydration.   Hold Gabapentin   Low K diet.   DVT ppx        I was able to get in contact with the wife and discussed the case in detail.  Name Tommy Brown, phone [phone].    Patient stated that if she has problems with the phone that always goes to voicemail.        Subjective: Nausea overnight, little better today.  Denies abdominal pain,  chills.        Mr. [name] is a  72 y.o. year old male who is being seen for obstructive uropathy, hyperkalemia, renal failure        Objective:         Vital Signs:   Visit Vitals      BP  149/72 (BP 1 Location: Left arm, BP Patient Position: Supine)     Pulse  70     Temp  97.5 ??F (36.4 ??C)     Resp  18     Ht  5\' 8"  (1.727 m)     Wt  70.8 kg (156 lb 1.4 oz)     SpO2  97%        BMI  23.73 kg/m??        Physical Exam:   GENERAL: Alert and oriented  times 3.   HEAD: Head normocephalic, atraumatic    Eyes: Conjunctivae pink, anicteric   CARDIOVASCULAR: S1 and S2 heard, murmur   RESPIRATORY: Effort normal. no crackles. no ronchi   ABDOMEN: Soft, nontender, nondistended. Normal bowel present.    NEUROLOGIC: Alert and oriented times 3. Able to move all 4 extremities.    PSYCHIATRIC: Normal affect.      Intake and Output:   Last three shifts:  04/27 1901 - 04/29 0700   In: 3996 [P.O.:360; I.V.:3636]   Out: 3600 [Urine:3600]      Lab Results:     Recent Results (from the past 24 hour(s))     GLUCOSE, POC          Collection Time: 06/23/18  8:58 AM         Result  Value  Ref Range            Glucose (POC)  224 (H)  65 - 105 mg/dL       POTASSIUM          Collection Time: 06/23/18  9:02 AM         Result  Value  Ref Range            Potassium  5.4 (H)  3.5 - 5.1 mEq/L       GLUCOSE, POC          Collection Time: 06/23/18  9:46 AM         Result  Value  Ref Range            Glucose (POC)  402 (HH)  65 - 105 mg/dL       GLUCOSE, POC          Collection Time: 06/23/18  3:38 PM         Result  Value  Ref Range            Glucose (POC)  110 (H)  65 - 105 mg/dL       CBC WITH AUTOMATED DIFF          Collection Time: 06/24/18  3:13 AM         Result  Value  Ref Range            WBC  8.0  4.0 - 11.0 1000/mm3            RBC  3.30 (L)  3.80 - 5.70 M/uL       HGB  8.6 (L)  12.4 - 17.2 gm/dl       HCT  28.9 (L)  37.0 - 50.0 %       MCV  87.6  80.0 - 98.0 fL       MCH  26.1  23.0 - 34.6 pg       MCHC  29.8 (L)  30.0 - 36.0 gm/dl        PLATELET  282  140 - 450 1000/mm3       MPV  11.8 (H)  6.0 - 10.0 fL       RDW-SD  53.5 (H)  35.1 - 43.9         NRBC  0  0 - 0         IMMATURE GRANULOCYTES  0.6  0.0 - 3.0 %       NEUTROPHILS  72.5 (H)  34 - 64 %       LYMPHOCYTES  21.0 (L)  28 - 48 %  MONOCYTES  5.0  1 - 13 %       EOSINOPHILS  0.8  0 - 5 %       BASOPHILS  0.1  0 - 3 %       RENAL FUNCTION PANEL          Collection Time: 06/24/18  3:13 AM         Result  Value  Ref Range            Sodium  143  136 - 145 mEq/L       Potassium  6.6 (HH)  3.5 - 5.1 mEq/L       Chloride  115 (H)  98 - 107 mEq/L       CO2  23  21 - 32 mEq/L       Glucose  111 (H)  74 - 106 mg/dl       BUN  39 (H)  7 - 25 mg/dl       Creatinine  2.0 (H)  0.6 - 1.3 mg/dl       GFR est AA  42.0          GFR est non-AA  35          Calcium  9.4  8.5 - 10.1 mg/dl       Albumin  3.0 (L)  3.4 - 5.0 gm/dl       Phosphorus  4.0  2.5 - 4.9 mg/dl            Anion gap  5  5 - 15 mmol/L         Medications:     Current Facility-Administered Medications          Medication  Dose  Route  Frequency           ?  insulin regular (NOVOLIN R, HUMULIN R) injection 10 Units   10 Units  IntraVENous  ONCE           ?  calcium gluconate 1 gram in sodium chloride (ISO-OSM) 50 mL infusion   1 g  IntraVENous  ONCE     ?  patiromer calcium sorbitex (VELTASSA) powder 8.4 g   8.4 g  Oral  ONCE     ?  dextrose (D50W) injection syrg 25 g   25 g  IntraVENous  ONCE     ?  famotidine (PF) (PEPCID) 20 mg in 0.9% sodium chloride 10 mL injection   20 mg  IntraVENous  Q12H     ?  polyvinyl alcohol-povidon(PF) (REFRESH CLASSIC) 1.4-0.6 % ophthalmic solution 2 Drop   2 Drop  Both Eyes  Q6H PRN     ?  heparin (porcine) injection 5,000 Units   5,000 Units  SubCUTAneous  Q12H     ?  cefTRIAXone (ROCEPHIN) 1 g in 0.9% sodium chloride (MBP/ADV) 50 mL MBP   1 g  IntraVENous  Q24H     ?  0.9% sodium chloride infusion   100 mL/hr  IntraVENous  CONTINUOUS     ?  amLODIPine (NORVASC) tablet 5 mg   5 mg  Oral  DAILY     ?   aspirin chewable tablet 81 mg   81 mg  Oral  DAILY     ?  metoprolol succinate (TOPROL-XL) XL tablet 25 mg   25 mg  Oral  DAILY     ?  tamsulosin (FLOMAX) capsule 0.4 mg   0.4 mg  Oral  DAILY     ?  naloxone (NARCAN) injection 0.1 mg   0.1 mg  IntraVENous  PRN     ?  acetaminophen (TYLENOL) tablet 650 mg   650 mg  Oral  Q4H PRN          Or           ?  acetaminophen (TYLENOL) solution 650 mg   650 mg  Oral  Q4H PRN          Or           ?  acetaminophen (TYLENOL) suppository 650 mg   650 mg  Rectal  Q4H PRN           ?  ondansetron (ZOFRAN) injection 4 mg   4 mg  IntraVENous  Q4H PRN           ?  polyethylene glycol (MIRALAX) packet 17 g   17 g  Oral  DAILY PRN           Dragon dictation software was used for portions of this report. Unintended errors in transcription may occur.   Edger House, MD   June 24, 2018   7:38 AM

## 2018-06-24 NOTE — Progress Notes (Signed)
Progress Notes by Horace Porteous, MD at 06/24/18 [phone]                Author: Horace Porteous, MD  Service: Nephrology  Author Type: Physician       Filed: 06/24/18 0956  Date of Service: 06/24/18 0951  Status: Signed          Editor: Horace Porteous, MD (Physician)                          Renal Progress Note           Patient: Tommy Brown  Age: 73 y.o.  Sex: male          Date of Birth: 01/05/1946  Admit Date: 06/22/2018  PCP: Daralene Milch, MD     MRN: [id number]   CSN: [id number]                Assessment     1.  Acute kidney injury.  Getting better with good UOP.   2.  Hyperkalemia.  Again this is hard to explain with the improving renal function and normal UOP.  Consider that the heparin (due to interference with aldosterone) or the beta blocker (prevents cellular shifting) are to blame.   3.  Postop day 2 status post left ureteral stenting due to obstructing stone        Plan     1.  Agree with medical RX and Lokelma.   2.  Add lasix 40 mg iv once.   3.  Repeat labs ordered.   4.  Stop heparin and place SCD's        Subjective     No SOB, CP.  Had some nausea yesterday evening.        Review of Systems     No fever, chills   No chest pain, palpitations   No SOB, cough, hemoptysis   No abdominal pain, no vomiting/diarrhea/constipation.  Positive for nausea   Urinating without difficulty, no dysuria, no hematuria   No LE edema   No anxiety or depression         Objective        Visit Vitals      BP  161/89 (BP 1 Location: Left arm, BP Patient Position: Sitting)     Pulse  87     Temp  97.3 ??F (36.3 ??C)     Resp  18     Ht  5\' 8"  (1.727 m)     Wt  70.8 kg (156 lb 1.4 oz)     SpO2  99%        BMI  23.73 kg/m??           Intake/Output Summary (Last 24 hours) at 06/24/2018 0951   Last data filed at 06/24/2018 0554     Gross per 24 hour        Intake  2661.67 ml        Output  2300 ml        Net  361.67 ml             Physical Assessment     AOZ:HYQM, NAD   EYES: sclera clear   Neck:   Without JVD or adenopathy   LUNGS:Clear to Auscultation, No Rales or Rhonchi.Good Air Entry   CVS EXM: Heart RRR, No S4 or S3 Gallop, No Pericardial Rub.   Abdomen: Soft, Non Tender, No CVA tenderness.  Bowel Sounds normal.   Extremities: No Pitting Edema. No deformities   SKIN:No Rashes,Normal Turgor   Neurological exam:Awake alert and conversant.         Medications          Current Facility-Administered Medications          Medication  Dose  Route  Frequency           ?  [START ON 06/25/2018] patiromer calcium sorbitex (VELTASSA) powder 8.4 g   8.4 g  Oral  DAILY     ?  furosemide (LASIX) injection 40 mg   40 mg  IntraVENous  ONCE     ?  [START ON 06/25/2018] famotidine (PF) (PEPCID) 20 mg in 0.9% sodium chloride 10 mL injection   20 mg  IntraVENous  DAILY     ?  polyvinyl alcohol-povidon(PF) (REFRESH CLASSIC) 1.4-0.6 % ophthalmic solution 2 Drop   2 Drop  Both Eyes  Q6H PRN     ?  cefTRIAXone (ROCEPHIN) 1 g in 0.9% sodium chloride (MBP/ADV) 50 mL MBP   1 g  IntraVENous  Q24H     ?  0.9% sodium chloride infusion   100 mL/hr  IntraVENous  CONTINUOUS           ?  amLODIPine (NORVASC) tablet 5 mg   5 mg  Oral  DAILY           ?  aspirin chewable tablet 81 mg   81 mg  Oral  DAILY     ?  metoprolol succinate (TOPROL-XL) XL tablet 25 mg   25 mg  Oral  DAILY     ?  tamsulosin (FLOMAX) capsule 0.4 mg   0.4 mg  Oral  DAILY     ?  naloxone (NARCAN) injection 0.1 mg   0.1 mg  IntraVENous  PRN     ?  acetaminophen (TYLENOL) tablet 650 mg   650 mg  Oral  Q4H PRN          Or           ?  acetaminophen (TYLENOL) solution 650 mg   650 mg  Oral  Q4H PRN          Or           ?  acetaminophen (TYLENOL) suppository 650 mg   650 mg  Rectal  Q4H PRN     ?  ondansetron (ZOFRAN) injection 4 mg   4 mg  IntraVENous  Q4H PRN           ?  polyethylene glycol (MIRALAX) packet 17 g   17 g  Oral  DAILY PRN                Laboratory:          CBC w/Diff     Recent Labs         06/24/18   0313  06/23/18   0625  06/22/18   0825      WBC  8.0  6.7   7.4      RBC  3.30*  3.26*  3.44*      HGB  8.6*  8.6*  9.2*      HCT  28.9*  28.2*  29.9*      PLT  282  256  276                Recent Labs         06/24/18  9449  06/23/18   0902  06/23/18   0625  06/22/18   0825      GLU  111*   --   178*  124*      NA  143   --   141  140      K  6.6*  5.4*  6.7*  5.1      CL  115*   --   115*  113*      CO2  23   --   21  19*      BUN  39*   --   50*  60*      CREA  2.0*   --   2.6*  3.7*      CA  9.4   --   9.4  9.2      AGAP  5   --   5  8      ALB  3.0*   --    --    --                   Lab Results         Component  Value  Date/Time            Calcium  9.4  06/24/2018 03:13 AM            Phosphorus  4.0  06/24/2018 03:13 AM              Horace Porteous, MD   June 24, 2018 9:51 AM   [place],   [phone]

## 2018-06-24 NOTE — Progress Notes (Signed)
Patient in room [place] has a potassium of 6.6.Ronal Fear RN  Ext [phone]

## 2018-06-25 LAB — HEPATIC FUNCTION PANEL
ALT (SGPT): 71 U/L (ref 12–78)
ALT: 71 U/L (ref 12–78)
AST (SGOT): 80 U/L — ABNORMAL HIGH (ref 15–37)
AST: 80 U/L — ABNORMAL HIGH (ref 15–37)
Albumin: 3 gm/dl — ABNORMAL LOW (ref 3.4–5.0)
Albumin: 3 gm/dl — ABNORMAL LOW (ref 3.4–5.0)
Alk. phosphatase: 138 U/L — ABNORMAL HIGH (ref 45–117)
Alkaline Phosphatase: 138 U/L — ABNORMAL HIGH (ref 45–117)
Bilirubin, Direct: 0.2 mg/dl (ref 0.0–0.2)
Bilirubin, direct: 0.2 mg/dl (ref 0.0–0.2)
Bilirubin, total: 0.4 mg/dl (ref 0.2–1.0)
Protein, total: 7.7 gm/dl (ref 6.4–8.2)
Total Bilirubin: 0.4 mg/dl (ref 0.2–1.0)
Total Protein: 7.7 gm/dl (ref 6.4–8.2)

## 2018-06-25 LAB — CBC WITH AUTOMATED DIFF
BASOPHILS: 0.1 % (ref 0–3)
EOSINOPHILS: 1.6 % (ref 0–5)
HCT: 29.5 % — ABNORMAL LOW (ref 37.0–50.0)
HGB: 9.1 gm/dl — ABNORMAL LOW (ref 12.4–17.2)
IMMATURE GRANULOCYTES: 0.7 % (ref 0.0–3.0)
LYMPHOCYTES: 23.7 % — ABNORMAL LOW (ref 28–48)
MCH: 26.5 pg (ref 23.0–34.6)
MCHC: 30.8 gm/dl (ref 30.0–36.0)
MCV: 86 fL (ref 80.0–98.0)
MONOCYTES: 6 % (ref 1–13)
MPV: 11.2 fL — ABNORMAL HIGH (ref 6.0–10.0)
NEUTROPHILS: 67.9 % — ABNORMAL HIGH (ref 34–64)
NRBC: 0 (ref 0–0)
PLATELET: 257 10*3/uL (ref 140–450)
RBC: 3.43 M/uL — ABNORMAL LOW (ref 3.80–5.70)
RDW-SD: 53.2 — ABNORMAL HIGH (ref 35.1–43.9)
WBC: 7 10*3/uL (ref 4.0–11.0)

## 2018-06-25 LAB — METABOLIC PANEL, BASIC
Anion gap: 9 mmol/L (ref 5–15)
BUN: 35 mg/dl — ABNORMAL HIGH (ref 7–25)
CO2: 22 mEq/L (ref 21–32)
Calcium: 9.2 mg/dl (ref 8.5–10.1)
Chloride: 112 mEq/L — ABNORMAL HIGH (ref 98–107)
Creatinine: 1.8 mg/dl — ABNORMAL HIGH (ref 0.6–1.3)
GFR est AA: 48
GFR est non-AA: 39
Glucose: 105 mg/dl (ref 74–106)
Potassium: 4.6 mEq/L (ref 3.5–5.1)
Sodium: 142 mEq/L (ref 136–145)

## 2018-06-25 LAB — LIPASE
Lipase: 18 U/L — ABNORMAL LOW (ref 73–393)
Lipase: 18 U/L — ABNORMAL LOW (ref 73–393)

## 2018-06-25 LAB — BASIC METABOLIC PANEL
Anion Gap: 9 mmol/L (ref 5–15)
BUN: 35 mg/dl — ABNORMAL HIGH (ref 7–25)
CO2: 22 mEq/L (ref 21–32)
Calcium: 9.2 mg/dl (ref 8.5–10.1)
Chloride: 112 mEq/L — ABNORMAL HIGH (ref 98–107)
Creatinine: 1.8 mg/dl — ABNORMAL HIGH (ref 0.6–1.3)
EGFR IF NonAfrican American: 39
GFR African American: 48
Glucose: 105 mg/dl (ref 74–106)
Potassium: 4.6 mEq/L (ref 3.5–5.1)
Sodium: 142 mEq/L (ref 136–145)

## 2018-06-25 LAB — CBC WITH AUTO DIFFERENTIAL
Basophils %: 0.1 % (ref 0–3)
Eosinophils %: 1.6 % (ref 0–5)
Hematocrit: 29.5 % — ABNORMAL LOW (ref 37.0–50.0)
Hemoglobin: 9.1 gm/dl — ABNORMAL LOW (ref 12.4–17.2)
Immature Granulocytes: 0.7 % (ref 0.0–3.0)
Lymphocytes %: 23.7 % — ABNORMAL LOW (ref 28–48)
MCH: 26.5 pg (ref 23.0–34.6)
MCHC: 30.8 gm/dl (ref 30.0–36.0)
MCV: 86 fL (ref 80.0–98.0)
MPV: 11.2 fL — ABNORMAL HIGH (ref 6.0–10.0)
Monocytes %: 6 % (ref 1–13)
Neutrophils %: 67.9 % — ABNORMAL HIGH (ref 34–64)
Nucleated RBCs: 0 (ref 0–0)
Platelets: 257 10*3/uL (ref 140–450)
RBC: 3.43 M/uL — ABNORMAL LOW (ref 3.80–5.70)
RDW-SD: 53.2 — ABNORMAL HIGH (ref 35.1–43.9)
WBC: 7 10*3/uL (ref 4.0–11.0)

## 2018-06-25 MED ORDER — MELATONIN 3 MG TAB
3 mg | Freq: Every evening | ORAL | Status: DC | PRN
Start: 2018-06-25 — End: 2018-06-26
  Administered 2018-06-25: 03:00:00 via ORAL

## 2018-06-25 MED ORDER — CEPHALEXIN 500 MG CAP
500 mg | Freq: Two times a day (BID) | ORAL | Status: DC
Start: 2018-06-25 — End: 2018-06-26
  Administered 2018-06-26: 12:00:00 via ORAL

## 2018-06-25 MED FILL — PROMETHAZINE IN NS 12.5 MG/50 ML IV PIGGY BAG: 12.5 mg/50 ml | INTRAVENOUS | Qty: 50

## 2018-06-25 MED FILL — MELATONIN 3 MG TAB: 3 mg | ORAL | Qty: 1

## 2018-06-25 MED FILL — ACETAMINOPHEN 325 MG TABLET: 325 mg | ORAL | Qty: 2

## 2018-06-25 MED FILL — VELTASSA 8.4 GRAM ORAL POWDER PACKET: 8.4 gram | ORAL | Qty: 1

## 2018-06-25 MED FILL — CEFTRIAXONE 1 GRAM SOLUTION FOR INJECTION: 1 gram | INTRAMUSCULAR | Qty: 1

## 2018-06-25 MED FILL — METOPROLOL SUCCINATE SR 25 MG 24 HR TAB: 25 mg | ORAL | Qty: 1

## 2018-06-25 MED FILL — POLYVINYL ALCOHOL-POVIDONE 1.4 %-0.6 % EYE DROPPERETTE: OPHTHALMIC | Qty: 1

## 2018-06-25 MED FILL — AMLODIPINE 5 MG TAB: 5 mg | ORAL | Qty: 1

## 2018-06-25 MED FILL — FAMOTIDINE (PF) 20 MG/2 ML IV: 20 mg/2 mL | INTRAVENOUS | Qty: 2

## 2018-06-25 MED FILL — ASPIRIN 81 MG CHEWABLE TAB: 81 mg | ORAL | Qty: 1

## 2018-06-25 MED FILL — TAMSULOSIN SR 0.4 MG 24 HR CAP: 0.4 mg | ORAL | Qty: 1

## 2018-06-25 NOTE — Progress Notes (Signed)
Hospitalist Progress Note         [facility]    Daily Progress Note: 06/25/2018    Assessment/Plan:   1.  AKI, CKD???3 secondary to obstructive uropathy and NSAID  2.  Hyperkalemia, resolved  3.  Obstructive uropathy with left hydronephrosis from proximal ureteral stone, stent placed 4/27  4.  Nephrolithiasis  5.  Metabolic acidosis, resolved  6.  HTN  7.  CAD, s/p CABG  8.  H/O pancreatic cancer, status post Whipple procedure and radiation  9.  BPH  10.  Dyslipidemia  11.  Disposition: Scr close to baseline, DC Foley, check labs in a.m., probable DC tomorrow.  12. UTI, change to po Keflex  Subjective:   No complaints.      Respiratory ROS: no cough, shortness of breath,pleutic chest pain or wheezing  Cardiovascular ROS: no chest pain , dyspnea on exertion, orthopnea  Gastrointestinal ROS: no abdominal pain, nausea,vomiting, diarrhea, melena or hematechezia  Genito-Urinary ROS: Foley  Objective:   Physical Exam:     Visit Vitals  BP 155/83 (BP 1 Location: Left arm, BP Patient Position: Sitting)   Pulse 78   Temp 97.9 ??F (36.6 ??C)   Resp 18   Ht '5\' 8"'$  (1.727 m)   Wt 69.7 kg (153 lb 10.6 oz)   SpO2 97%   BMI 23.36 kg/m??      O2 Device: Room air    Temp (24hrs), Avg:98 ??F (36.7 ??C), Min:97.9 ??F (36.6 ??C), Max:98.4 ??F (36.9 ??C)    No intake/output data recorded.   04/28 1901 - 04/30 0700  In: 3771.7 [I.V.:3771.7]  Out: 3050 [Urine:3050]    General:  Alert, cooperative, no distress, appears stated age.   Lungs:   Clear to auscultation bilaterally.    :     Heart:  Regular rate and rhythm, S1, S2 normal, no murmur, click, rub or gallop.   Abdomen:   Soft, non-tender. Bowel sounds normal. No masses,  No organomegaly.   Extremities:  FROM, no LE edema   Pulses: 2+ and symmetric all extremities.   Skin: Skin color, texture, turgor normal. No rashes or lesions   :      Data Review:       24 Hour Results:  Recent Results (from the past 24 hour(s))   POTASSIUM    Collection Time: 06/24/18  9:25 AM    Result Value Ref Range    Potassium 4.4 3.5 - 5.1 mEq/L   CBC WITH AUTOMATED DIFF    Collection Time: 06/25/18  2:25 AM   Result Value Ref Range    WBC 7.0 4.0 - 11.0 1000/mm3    RBC 3.43 (L) 3.80 - 5.70 M/uL    HGB 9.1 (L) 12.4 - 17.2 gm/dl    HCT 29.5 (L) 37.0 - 50.0 %    MCV 86.0 80.0 - 98.0 fL    MCH 26.5 23.0 - 34.6 pg    MCHC 30.8 30.0 - 36.0 gm/dl    PLATELET 257 140 - 450 1000/mm3    MPV 11.2 (H) 6.0 - 10.0 fL    RDW-SD 53.2 (H) 35.1 - 43.9      NRBC 0 0 - 0      IMMATURE GRANULOCYTES 0.7 0.0 - 3.0 %    NEUTROPHILS 67.9 (H) 34 - 64 %    LYMPHOCYTES 23.7 (L) 28 - 48 %    MONOCYTES 6.0 1 - 13 %    EOSINOPHILS 1.6 0 - 5 %  BASOPHILS 0.1 0 - 3 %   METABOLIC PANEL, BASIC    Collection Time: 06/25/18  2:25 AM   Result Value Ref Range    Sodium 142 136 - 145 mEq/L    Potassium 4.6 3.5 - 5.1 mEq/L    Chloride 112 (H) 98 - 107 mEq/L    CO2 22 21 - 32 mEq/L    Glucose 105 74 - 106 mg/dl    BUN 35 (H) 7 - 25 mg/dl    Creatinine 1.8 (H) 0.6 - 1.3 mg/dl    GFR est AA 48.0      GFR est non-AA 39      Calcium 9.2 8.5 - 10.1 mg/dl    Anion gap 9 5 - 15 mmol/L   LIPASE    Collection Time: 06/25/18  2:25 AM   Result Value Ref Range    Lipase 18 (L) 73 - 393 U/L   HEPATIC FUNCTION PANEL    Collection Time: 06/25/18  2:25 AM   Result Value Ref Range    AST (SGOT) 80 (H) 15 - 37 U/L    ALT (SGPT) 71 12 - 78 U/L    Alk. phosphatase 138 (H) 45 - 117 U/L    Bilirubin, total 0.4 0.2 - 1.0 mg/dl    Protein, total 7.7 6.4 - 8.2 gm/dl    Albumin 3.0 (L) 3.4 - 5.0 gm/dl    Bilirubin, direct 0.2 0.0 - 0.2 mg/dl       Problem List:  Problem List as of 06/25/2018 Date Reviewed: 07/16/2018          Codes Class Noted - Resolved    AKI (acute kidney injury) ([place]) ICD-10-CM: N17.9  ICD-9-CM: 584.9  Jul 16, 2018 - Present        Sepsis ([place]) ICD-10-CM: A41.9  ICD-9-CM: 038.9, 995.91  01/17/2018 - Present               Medications reviewed  Current Facility-Administered Medications   Medication Dose Route Frequency    ??? patiromer calcium sorbitex (VELTASSA) powder 8.4 g  8.4 g Oral DAILY   ??? famotidine (PF) (PEPCID) 20 mg in 0.9% sodium chloride 10 mL injection  20 mg IntraVENous DAILY   ??? lidocaine (URO-JET/ GLYDO) 2 % jelly   Topical ONCE PRN   ??? promethazine (PHENERGAN) 12.5 mg in NS 50 mL IVPB  12.5 mg IntraVENous Q6H PRN   ??? melatonin tablet 3 mg  3 mg Oral QHS PRN   ??? polyvinyl alcohol-povidon(PF) (REFRESH CLASSIC) 1.4-0.6 % ophthalmic solution 2 Drop  2 Drop Both Eyes Q6H PRN   ??? cefTRIAXone (ROCEPHIN) 1 g in 0.9% sodium chloride (MBP/ADV) 50 mL MBP  1 g IntraVENous Q24H   ??? 0.9% sodium chloride infusion  100 mL/hr IntraVENous CONTINUOUS   ??? amLODIPine (NORVASC) tablet 5 mg  5 mg Oral DAILY   ??? aspirin chewable tablet 81 mg  81 mg Oral DAILY   ??? metoprolol succinate (TOPROL-XL) XL tablet 25 mg  25 mg Oral DAILY   ??? tamsulosin (FLOMAX) capsule 0.4 mg  0.4 mg Oral DAILY   ??? naloxone (NARCAN) injection 0.1 mg  0.1 mg IntraVENous PRN   ??? acetaminophen (TYLENOL) tablet 650 mg  650 mg Oral Q4H PRN    Or   ??? acetaminophen (TYLENOL) solution 650 mg  650 mg Oral Q4H PRN    Or   ??? acetaminophen (TYLENOL) suppository 650 mg  650 mg Rectal Q4H PRN   ??? ondansetron (ZOFRAN) injection 4 mg  4 mg IntraVENous Q4H PRN   ??? polyethylene glycol (MIRALAX) packet 17 g  17 g Oral DAILY PRN        Care Plan discussed with: pt & called his wife    Total time spent with patient: 35 minutes.    Carlean Jews, MD  June 25, 2018  8:36 AM

## 2018-06-25 NOTE — Other (Signed)
Bedside shift change report given toTami RN (oncoming nurse) by Fabian November  RN (offgoing nurse). Report included the following information SBAR, Kardex and Cardiac Rhythm SR.

## 2018-06-25 NOTE — Progress Notes (Signed)
Patient refusing bed alarm.

## 2018-06-25 NOTE — Progress Notes (Addendum)
[facility]: [phone]    "Brown, Tommy male 73 y.o. Dx: AKI. Attending: Concha [place]. Pt requesting for sleep med. Has melatonin ordered but says it doesn't help him. Thanks Karie Kirks RN [phone]"    Dr. Clydene Fake called RN back. Gave RN order for gabapentin 600mg  x1 dose and benadryl 25mg  PO x1 dose for sleep.

## 2018-06-25 NOTE — Other (Signed)
Patient voided 239ml clear urine and bladder scan done and is 30ml.

## 2018-06-25 NOTE — Progress Notes (Signed)
Renal Progress Note     Patient: Tommy Brown Age: 73 y.o. Sex: male    Date of Birth: January 07, 1946 Admit Date: 06/22/2018 PCP: Daralene Milch, MD   MRN: [id number]  CSN: [id number]         Assessment   1. Acute kidney injury.  Getting better with good UOP.  2. Hyperkalemia.  Resolved.  This may have been the affect of heparin.  3. Postop day 3 status post left ureteral stenting due to obstructing stone    Plan   1. Agree with DC foley.  2. DC IVF and ambulate.  3. Labs in AM and hopefully home at that time.    Subjective   No SOB, CP.  Still with occasional nausea.    Review of Systems   No fever, chills  No chest pain, palpitations  No SOB, cough, hemoptysis  No abdominal pain, no vomiting/diarrhea/constipation.  Positive for nausea  Urinating without difficulty, no dysuria, no hematuria  No LE edema  No anxiety or depression     Objective     Visit Vitals  BP 155/83 (BP 1 Location: Left arm, BP Patient Position: Sitting)   Pulse 78   Temp 97.9 ??F (36.6 ??C)   Resp 18   Ht 5\' 8"  (1.727 m)   Wt 69.7 kg (153 lb 10.6 oz)   SpO2 97%   BMI 23.36 kg/m??       Intake/Output Summary (Last 24 hours) at 06/25/2018 1005  Last data filed at 06/25/2018 0642  Gross per 24 hour   Intake 1350 ml   Output 1600 ml   Net -250 ml       Physical Assessment   WNU:UVOZ, NAD  EYES: sclera clear  Neck:  Without JVD or adenopathy  LUNGS:Clear to Auscultation, No Rales or Rhonchi.Good Air Entry  CVS EXM: Heart RRR, No S4 or S3 Gallop, No Pericardial Rub.  Abdomen: Soft, Non Tender, No CVA tenderness. Bowel Sounds normal.  Extremities: No Pitting Edema. No deformities  SKIN:No Rashes,Normal Turgor  Neurological exam:Awake alert and conversant.     Medications     Current Facility-Administered Medications   Medication Dose Route Frequency   ??? [START ON 06/26/2018] cephALEXin (KEFLEX) capsule 500 mg  500 mg Oral Q12H   ??? famotidine (PF) (PEPCID) 20 mg in 0.9% sodium chloride 10 mL injection  20 mg IntraVENous DAILY    ??? lidocaine (URO-JET/ GLYDO) 2 % jelly   Topical ONCE PRN   ??? promethazine (PHENERGAN) 12.5 mg in NS 50 mL IVPB  12.5 mg IntraVENous Q6H PRN   ??? melatonin tablet 3 mg  3 mg Oral QHS PRN   ??? polyvinyl alcohol-povidon(PF) (REFRESH CLASSIC) 1.4-0.6 % ophthalmic solution 2 Drop  2 Drop Both Eyes Q6H PRN   ??? 0.9% sodium chloride infusion  100 mL/hr IntraVENous CONTINUOUS   ??? amLODIPine (NORVASC) tablet 5 mg  5 mg Oral DAILY   ??? aspirin chewable tablet 81 mg  81 mg Oral DAILY   ??? metoprolol succinate (TOPROL-XL) XL tablet 25 mg  25 mg Oral DAILY   ??? tamsulosin (FLOMAX) capsule 0.4 mg  0.4 mg Oral DAILY   ??? naloxone (NARCAN) injection 0.1 mg  0.1 mg IntraVENous PRN   ??? acetaminophen (TYLENOL) tablet 650 mg  650 mg Oral Q4H PRN    Or   ??? acetaminophen (TYLENOL) solution 650 mg  650 mg Oral Q4H PRN    Or   ??? acetaminophen (TYLENOL) suppository 650 mg  650  mg Rectal Q4H PRN   ??? ondansetron (ZOFRAN) injection 4 mg  4 mg IntraVENous Q4H PRN   ??? polyethylene glycol (MIRALAX) packet 17 g  17 g Oral DAILY PRN         Laboratory:     CBC w/Diff   Recent Labs     06/25/18  0225 06/24/18  0313 06/23/18  0625   WBC 7.0 8.0 6.7   RBC 3.43* 3.30* 3.26*   HGB 9.1* 8.6* 8.6*   HCT 29.5* 28.9* 28.2*   PLT 257 282 256        Recent Labs     06/25/18  0225 06/24/18  0925 06/24/18  0313  06/23/18  0625   GLU 105  --  111*  --  178*   NA 142  --  143  --  141   K 4.6 4.4 6.6*   < > 6.7*   CL 112*  --  115*  --  115*   CO2 22  --  23  --  21   BUN 35*  --  39*  --  50*   CREA 1.8*  --  2.0*  --  2.6*   CA 9.2  --  9.4  --  9.4   AGAP 9  --  5  --  5   AP 138*  --   --   --   --    TP 7.7  --   --   --   --    ALB 3.0*  --  3.0*  --   --    ALT 71  --   --   --   --    SGOT 80*  --   --   --   --     < > = values in this interval not displayed.        Lab Results   Component Value Date/Time    Calcium 9.2 06/25/2018 02:25 AM    Phosphorus 4.0 06/24/2018 03:13 AM         Horace Porteous, MD  June 25, 2018 9:51 AM   [employer],  [phone]

## 2018-06-25 NOTE — Progress Notes (Signed)
 [facility]: [phone]    [address] Mood male 73 y.o. Dx: AKI. Attending: Eric. Pt requesting for sleep med. Has melatonin ordered but says it doesn't help him. Thanks Retha RN [phone]    Dr. Murrell called RN back. Gave RN order for gabapentin  600mg  x1 dose and benadryl 25mg  PO x1 dose for sleep.

## 2018-06-25 NOTE — Progress Notes (Signed)
Progress Notes by Horace Porteous, MD at 06/25/18 1005                Author: Horace Porteous, MD  Service: Nephrology  Author Type: Physician       Filed: 06/25/18 1006  Date of Service: 06/25/18 1005  Status: Signed          Editor: Horace Porteous, MD (Physician)                          Renal Progress Note           Patient: Tommy Brown  Age: 73 y.o.  Sex: male          Date of Birth: December 03, 1945  Admit Date: 06/22/2018  PCP: Daralene Milch, MD     MRN: [id number]   CSN: [id number]                Assessment     1.  Acute kidney injury.  Getting better with good UOP.   2.  Hyperkalemia.  Resolved.  This may have been the affect of heparin.   3.  Postop day 3 status post left ureteral stenting due to obstructing stone        Plan     1.  Agree with DC foley.   2.  DC IVF and ambulate.   3.  Labs in AM and hopefully home at that time.        Subjective     No SOB, CP.  Still with occasional nausea.        Review of Systems     No fever, chills   No chest pain, palpitations   No SOB, cough, hemoptysis   No abdominal pain, no vomiting/diarrhea/constipation.  Positive for nausea   Urinating without difficulty, no dysuria, no hematuria   No LE edema   No anxiety or depression         Objective        Visit Vitals      BP  155/83 (BP 1 Location: Left arm, BP Patient Position: Sitting)     Pulse  78     Temp  97.9 ??F (36.6 ??C)     Resp  18     Ht  5\' 8"  (1.727 m)     Wt  69.7 kg (153 lb 10.6 oz)     SpO2  97%        BMI  23.36 kg/m??           Intake/Output Summary (Last 24 hours) at 06/25/2018 1005   Last data filed at 06/25/2018 0642     Gross per 24 hour        Intake  1350 ml        Output  1600 ml        Net  -250 ml             Physical Assessment     WGN:FAOZ, NAD   EYES: sclera clear   Neck:  Without JVD or adenopathy   LUNGS:Clear to Auscultation, No Rales or Rhonchi.Good Air Entry   CVS EXM: Heart RRR, No S4 or S3 Gallop, No Pericardial Rub.   Abdomen: Soft, Non Tender, No CVA  tenderness. Bowel Sounds normal.   Extremities: No Pitting Edema. No deformities   SKIN:No Rashes,Normal Turgor   Neurological exam:Awake alert and conversant.         Medications  Current Facility-Administered Medications          Medication  Dose  Route  Frequency           ?  [START ON 06/26/2018] cephALEXin (KEFLEX) capsule 500 mg   500 mg  Oral  Q12H     ?  famotidine (PF) (PEPCID) 20 mg in 0.9% sodium chloride 10 mL injection   20 mg  IntraVENous  DAILY     ?  lidocaine (URO-JET/ GLYDO) 2 % jelly     Topical  ONCE PRN     ?  promethazine (PHENERGAN) 12.5 mg in NS 50 mL IVPB   12.5 mg  IntraVENous  Q6H PRN     ?  melatonin tablet 3 mg   3 mg  Oral  QHS PRN     ?  polyvinyl alcohol-povidon(PF) (REFRESH CLASSIC) 1.4-0.6 % ophthalmic solution 2 Drop   2 Drop  Both Eyes  Q6H PRN     ?  0.9% sodium chloride infusion   100 mL/hr  IntraVENous  CONTINUOUS     ?  amLODIPine (NORVASC) tablet 5 mg   5 mg  Oral  DAILY     ?  aspirin chewable tablet 81 mg   81 mg  Oral  DAILY           ?  metoprolol succinate (TOPROL-XL) XL tablet 25 mg   25 mg  Oral  DAILY           ?  tamsulosin (FLOMAX) capsule 0.4 mg   0.4 mg  Oral  DAILY     ?  naloxone (NARCAN) injection 0.1 mg   0.1 mg  IntraVENous  PRN     ?  acetaminophen (TYLENOL) tablet 650 mg   650 mg  Oral  Q4H PRN          Or           ?  acetaminophen (TYLENOL) solution 650 mg   650 mg  Oral  Q4H PRN          Or           ?  acetaminophen (TYLENOL) suppository 650 mg   650 mg  Rectal  Q4H PRN     ?  ondansetron (ZOFRAN) injection 4 mg   4 mg  IntraVENous  Q4H PRN           ?  polyethylene glycol (MIRALAX) packet 17 g   17 g  Oral  DAILY PRN                Laboratory:          CBC w/Diff     Recent Labs         06/25/18   0225  06/24/18   0313  06/23/18   0625      WBC  7.0  8.0  6.7      RBC  3.43*  3.30*  3.26*      HGB  9.1*  8.6*  8.6*      HCT  29.5*  28.9*  28.2*      PLT  257  282  256                Recent Labs         06/25/18   0225  06/24/18   0925  06/24/18    0313    06/23/18   0625      GLU  105   --  111*   --   178*      NA  142   --   143   --   141      K  4.6  4.4  6.6*    < >  6.7*      CL  112*   --   115*   --   115*      CO2  22   --   23   --   21      BUN  35*   --   39*   --   50*      CREA  1.8*   --   2.0*   --   2.6*      CA  9.2   --   9.4   --   9.4      AGAP  9   --   5   --   5      AP  138*   --    --    --    --       TP  7.7   --    --    --    --       ALB  3.0*   --   3.0*   --    --       ALT  71   --    --    --    --       SGOT  80*   --    --    --    --        < > = values in this interval not displayed.                  Lab Results         Component  Value  Date/Time            Calcium  9.2  06/25/2018 02:25 AM            Phosphorus  4.0  06/24/2018 03:13 AM              Horace Porteous, MD   June 25, 2018 9:51 AM   [employer],   [phone]

## 2018-06-25 NOTE — Progress Notes (Signed)
Hospitalist Progress Note         [facility]    Daily Progress Note: 06/25/2018    Assessment/Plan:   1.  AKI, CKD???3 secondary to obstructive uropathy and NSAID  2.  Hyperkalemia, resolved  3.  Obstructive uropathy with left hydronephrosis from proximal ureteral stone, stent placed 4/27  4.  Nephrolithiasis  5.  Metabolic acidosis, resolved  6.  HTN  7.  CAD, s/p CABG  8.  H/O pancreatic cancer, status post Whipple procedure and radiation  9.  BPH  10.  Dyslipidemia  11.  Disposition: Scr close to baseline, DC Foley, check labs in a.m., probable DC tomorrow.  12. UTI, change to po Keflex  Subjective:   No complaints.      Respiratory ROS: no cough, shortness of breath,pleutic chest pain or wheezing  Cardiovascular ROS: no chest pain , dyspnea on exertion, orthopnea  Gastrointestinal ROS: no abdominal pain, nausea,vomiting, diarrhea, melena or hematechezia  Genito-Urinary ROS: Foley  Objective:   Physical Exam:     Visit Vitals  BP 155/83 (BP 1 Location: Left arm, BP Patient Position: Sitting)   Pulse 78   Temp 97.9 ??F (36.6 ??C)   Resp 18   Ht 5' 8"  (1.727 m)   Wt 69.7 kg (153 lb 10.6 oz)   SpO2 97%   BMI 23.36 kg/m??      O2 Device: Room air    Temp (24hrs), Avg:98 ??F (36.7 ??C), Min:97.9 ??F (36.6 ??C), Max:98.4 ??F (36.9 ??C)    No intake/output data recorded.   04/28 1901 - 04/30 0700  In: 3771.7 [I.V.:3771.7]  Out: 3050 [Urine:3050]    General:  Alert, cooperative, no distress, appears stated age.   Lungs:   Clear to auscultation bilaterally.    :     Heart:  Regular rate and rhythm, S1, S2 normal, no murmur, click, rub or gallop.   Abdomen:   Soft, non-tender. Bowel sounds normal. No masses,  No organomegaly.   Extremities:  FROM, no LE edema   Pulses: 2+ and symmetric all extremities.   Skin: Skin color, texture, turgor normal. No rashes or lesions   :      Data Review:       24 Hour Results:  Recent Results (from the past 24 hour(s))   POTASSIUM    Collection Time: 06/24/18  9:25 AM   Result Value Ref  Range    Potassium 4.4 3.5 - 5.1 mEq/L   CBC WITH AUTOMATED DIFF    Collection Time: 06/25/18  2:25 AM   Result Value Ref Range    WBC 7.0 4.0 - 11.0 1000/mm3    RBC 3.43 (L) 3.80 - 5.70 M/uL    HGB 9.1 (L) 12.4 - 17.2 gm/dl    HCT 29.5 (L) 37.0 - 50.0 %    MCV 86.0 80.0 - 98.0 fL    MCH 26.5 23.0 - 34.6 pg    MCHC 30.8 30.0 - 36.0 gm/dl    PLATELET 257 140 - 450 1000/mm3    MPV 11.2 (H) 6.0 - 10.0 fL    RDW-SD 53.2 (H) 35.1 - 43.9      NRBC 0 0 - 0      IMMATURE GRANULOCYTES 0.7 0.0 - 3.0 %    NEUTROPHILS 67.9 (H) 34 - 64 %    LYMPHOCYTES 23.7 (L) 28 - 48 %    MONOCYTES 6.0 1 - 13 %    EOSINOPHILS 1.6 0 - 5 %  BASOPHILS 0.1 0 - 3 %   METABOLIC PANEL, BASIC    Collection Time: 06/25/18  2:25 AM   Result Value Ref Range    Sodium 142 136 - 145 mEq/L    Potassium 4.6 3.5 - 5.1 mEq/L    Chloride 112 (H) 98 - 107 mEq/L    CO2 22 21 - 32 mEq/L    Glucose 105 74 - 106 mg/dl    BUN 35 (H) 7 - 25 mg/dl    Creatinine 1.8 (H) 0.6 - 1.3 mg/dl    GFR est AA 48.0      GFR est non-AA 39      Calcium 9.2 8.5 - 10.1 mg/dl    Anion gap 9 5 - 15 mmol/L   LIPASE    Collection Time: 06/25/18  2:25 AM   Result Value Ref Range    Lipase 18 (L) 73 - 393 U/L   HEPATIC FUNCTION PANEL    Collection Time: 06/25/18  2:25 AM   Result Value Ref Range    AST (SGOT) 80 (H) 15 - 37 U/L    ALT (SGPT) 71 12 - 78 U/L    Alk. phosphatase 138 (H) 45 - 117 U/L    Bilirubin, total 0.4 0.2 - 1.0 mg/dl    Protein, total 7.7 6.4 - 8.2 gm/dl    Albumin 3.0 (L) 3.4 - 5.0 gm/dl    Bilirubin, direct 0.2 0.0 - 0.2 mg/dl       Problem List:  Problem List as of 06/25/2018 Date Reviewed: 2018/07/15          Codes Class Noted - Resolved    AKI (acute kidney injury) ([place]) ICD-10-CM: N17.9  ICD-9-CM: 584.9  07/15/2018 - Present        Sepsis ([place]) ICD-10-CM: A41.9  ICD-9-CM: 038.9, 995.91  01/17/2018 - Present               Medications reviewed  Current Facility-Administered Medications   Medication Dose Route Frequency   ??? patiromer calcium sorbitex (VELTASSA) powder  8.4 g  8.4 g Oral DAILY   ??? famotidine (PF) (PEPCID) 20 mg in 0.9% sodium chloride 10 mL injection  20 mg IntraVENous DAILY   ??? lidocaine (URO-JET/ GLYDO) 2 % jelly   Topical ONCE PRN   ??? promethazine (PHENERGAN) 12.5 mg in NS 50 mL IVPB  12.5 mg IntraVENous Q6H PRN   ??? melatonin tablet 3 mg  3 mg Oral QHS PRN   ??? polyvinyl alcohol-povidon(PF) (REFRESH CLASSIC) 1.4-0.6 % ophthalmic solution 2 Drop  2 Drop Both Eyes Q6H PRN   ??? cefTRIAXone (ROCEPHIN) 1 g in 0.9% sodium chloride (MBP/ADV) 50 mL MBP  1 g IntraVENous Q24H   ??? 0.9% sodium chloride infusion  100 mL/hr IntraVENous CONTINUOUS   ??? amLODIPine (NORVASC) tablet 5 mg  5 mg Oral DAILY   ??? aspirin chewable tablet 81 mg  81 mg Oral DAILY   ??? metoprolol succinate (TOPROL-XL) XL tablet 25 mg  25 mg Oral DAILY   ??? tamsulosin (FLOMAX) capsule 0.4 mg  0.4 mg Oral DAILY   ??? naloxone (NARCAN) injection 0.1 mg  0.1 mg IntraVENous PRN   ??? acetaminophen (TYLENOL) tablet 650 mg  650 mg Oral Q4H PRN    Or   ??? acetaminophen (TYLENOL) solution 650 mg  650 mg Oral Q4H PRN    Or   ??? acetaminophen (TYLENOL) suppository 650 mg  650 mg Rectal Q4H PRN   ??? ondansetron (ZOFRAN) injection 4 mg  4 mg IntraVENous Q4H PRN   ??? polyethylene glycol (MIRALAX) packet 17 g  17 g Oral DAILY PRN        Care Plan discussed with: pt & called his wife    Total time spent with patient: 35 minutes.    Carlean Jews, MD  June 25, 2018  8:36 AM

## 2018-06-26 LAB — METABOLIC PANEL, BASIC
Anion gap: 8 mmol/L (ref 5–15)
BUN: 36 mg/dl — ABNORMAL HIGH (ref 7–25)
CO2: 22 mEq/L (ref 21–32)
Calcium: 8.9 mg/dl (ref 8.5–10.1)
Chloride: 110 mEq/L — ABNORMAL HIGH (ref 98–107)
Creatinine: 1.7 mg/dl — ABNORMAL HIGH (ref 0.6–1.3)
GFR est AA: 51
GFR est non-AA: 42
Glucose: 102 mg/dl (ref 74–106)
Potassium: 4.5 mEq/L (ref 3.5–5.1)
Sodium: 140 mEq/L (ref 136–145)

## 2018-06-26 LAB — BASIC METABOLIC PANEL
Anion Gap: 8 mmol/L (ref 5–15)
BUN: 36 mg/dl — ABNORMAL HIGH (ref 7–25)
CO2: 22 mEq/L (ref 21–32)
Calcium: 8.9 mg/dl (ref 8.5–10.1)
Chloride: 110 mEq/L — ABNORMAL HIGH (ref 98–107)
Creatinine: 1.7 mg/dl — ABNORMAL HIGH (ref 0.6–1.3)
EGFR IF NonAfrican American: 42
GFR African American: 51
Glucose: 102 mg/dl (ref 74–106)
Potassium: 4.5 mEq/L (ref 3.5–5.1)
Sodium: 140 mEq/L (ref 136–145)

## 2018-06-26 MED ORDER — GABAPENTIN 300 MG CAP
300 mg | Freq: Once | ORAL | Status: AC
Start: 2018-06-26 — End: 2018-06-25
  Administered 2018-06-26: 02:00:00 via ORAL

## 2018-06-26 MED ORDER — DIPHENHYDRAMINE 25 MG CAP
25 mg | Freq: Once | ORAL | Status: AC
Start: 2018-06-26 — End: 2018-06-25
  Administered 2018-06-26: 02:00:00 via ORAL

## 2018-06-26 MED ORDER — CEPHALEXIN 500 MG CAP
500 mg | ORAL_CAPSULE | Freq: Two times a day (BID) | ORAL | 0 refills | Status: AC
Start: 2018-06-26 — End: 2018-07-01

## 2018-06-26 MED FILL — CEPHALEXIN 500 MG CAP: 500 mg | ORAL | Qty: 1

## 2018-06-26 MED FILL — POLYVINYL ALCOHOL-POVIDONE 1.4 %-0.6 % EYE DROPPERETTE: OPHTHALMIC | Qty: 1

## 2018-06-26 MED FILL — FAMOTIDINE (PF) 20 MG/2 ML IV: 20 mg/2 mL | INTRAVENOUS | Qty: 2

## 2018-06-26 MED FILL — TAMSULOSIN SR 0.4 MG 24 HR CAP: 0.4 mg | ORAL | Qty: 1

## 2018-06-26 MED FILL — ASPIRIN 81 MG CHEWABLE TAB: 81 mg | ORAL | Qty: 1

## 2018-06-26 MED FILL — AMLODIPINE 5 MG TAB: 5 mg | ORAL | Qty: 1

## 2018-06-26 MED FILL — DIPHENHYDRAMINE 25 MG CAP: 25 mg | ORAL | Qty: 1

## 2018-06-26 MED FILL — GABAPENTIN 300 MG CAP: 300 mg | ORAL | Qty: 2

## 2018-06-26 MED FILL — METOPROLOL SUCCINATE SR 25 MG 24 HR TAB: 25 mg | ORAL | Qty: 1

## 2018-06-26 NOTE — Other (Addendum)
----------  DocumentID: [id number]------------------------------------------------              [facility]                       Patient Education Report         Name: ORHAN, MAYORGA                  Date: 06/22/2018    MRN: [id number]                    Time: 11:24:46 PM         Patient ordered video: 'Patient Safety: Stay Safe While you are in the Hospital'    from [id number] via phone number: [phone] at 11:24:46 PM    Description: This program outlines some of the precautions patients can take to ensure a speedy recovery without extra complications. The video emphasizes the importance of communicating with the healthcare team.    ----------DocumentID: [id number]------------------------------------------------                       [facility]          Patient Education Report - Discharge Summary        Date: 06/26/2018   Time: 8:54:08 AM   Name: ELVER, STADLER   MRN: [id number]      Account Number: [id number]      Education History:        Patient ordered video: 'Patient Safety: Stay Safe While you are in the Hospital' from [email or web address] on 06/22/2018 11:24:46 PM

## 2018-06-26 NOTE — Progress Notes (Signed)
Renal Progress Note     Patient: Tommy Brown Age: 73 y.o. Sex: male    Date of Birth: Dec 12, 1945 Admit Date: 06/22/2018 PCP: Daralene Milch, MD   MRN: [id number]  CSN: [id number]         Assessment   1. Acute kidney injury.  Getting better with good UOP.  2. Hyperkalemia.  Resolved.  This may have been the affect of heparin.  3. Postop day 4  status post left ureteral stenting due to obstructing stone    Plan   1. Agree with DC.  2. I will arrange follow up with me as well.    Subjective   No SOB, CP.  Nausea still present but overall better.    Review of Systems   No fever, chills  No chest pain, palpitations  No SOB, cough, hemoptysis  No abdominal pain, no vomiting/diarrhea/constipation.  Positive for nausea  Urinating without difficulty, no dysuria, no hematuria  No LE edema  No anxiety or depression     Objective     Visit Vitals  BP 131/60 (BP 1 Location: Left arm, BP Patient Position: Supine)   Pulse 77   Temp 97.7 ??F (36.5 ??C)   Resp 18   Ht 5\' 8"  (1.727 m)   Wt 65.6 kg (144 lb 10 oz)   SpO2 97%   BMI 21.99 kg/m??       Intake/Output Summary (Last 24 hours) at 06/26/2018 0859  Last data filed at 06/26/2018 0540  Gross per 24 hour   Intake 580 ml   Output 550 ml   Net 30 ml       Physical Assessment   GUR:KYHC, NAD  EYES: sclera clear  Neck:  Without JVD or adenopathy  LUNGS:Clear to Auscultation, No Rales or Rhonchi.Good Air Entry  CVS EXM: Heart RRR, No S4 or S3 Gallop, No Pericardial Rub.  Abdomen: Soft, Non Tender, No CVA tenderness. Bowel Sounds normal.  Extremities: No Pitting Edema. No deformities  SKIN:No Rashes,Normal Turgor  Neurological exam:Awake alert and conversant.     Medications     Current Facility-Administered Medications   Medication Dose Route Frequency   ??? cephALEXin (KEFLEX) capsule 500 mg  500 mg Oral Q12H   ??? famotidine (PF) (PEPCID) 20 mg in 0.9% sodium chloride 10 mL injection  20 mg IntraVENous DAILY    ??? promethazine (PHENERGAN) 12.5 mg in NS 50 mL IVPB  12.5 mg IntraVENous Q6H PRN   ??? melatonin tablet 3 mg  3 mg Oral QHS PRN   ??? polyvinyl alcohol-povidon(PF) (REFRESH CLASSIC) 1.4-0.6 % ophthalmic solution 2 Drop  2 Drop Both Eyes Q6H PRN   ??? amLODIPine (NORVASC) tablet 5 mg  5 mg Oral DAILY   ??? aspirin chewable tablet 81 mg  81 mg Oral DAILY   ??? metoprolol succinate (TOPROL-XL) XL tablet 25 mg  25 mg Oral DAILY   ??? tamsulosin (FLOMAX) capsule 0.4 mg  0.4 mg Oral DAILY   ??? naloxone (NARCAN) injection 0.1 mg  0.1 mg IntraVENous PRN   ??? acetaminophen (TYLENOL) tablet 650 mg  650 mg Oral Q4H PRN    Or   ??? acetaminophen (TYLENOL) solution 650 mg  650 mg Oral Q4H PRN    Or   ??? acetaminophen (TYLENOL) suppository 650 mg  650 mg Rectal Q4H PRN   ??? ondansetron (ZOFRAN) injection 4 mg  4 mg IntraVENous Q4H PRN   ??? polyethylene glycol (MIRALAX) packet 17 g  17 g Oral DAILY PRN  Laboratory:     CBC w/Diff   Recent Labs     06/25/18  0225 06/24/18  0313   WBC 7.0 8.0   RBC 3.43* 3.30*   HGB 9.1* 8.6*   HCT 29.5* 28.9*   PLT 257 282        Recent Labs     06/26/18  0306 06/25/18  0225 06/24/18  0925 06/24/18  0313   GLU 102 105  --  111*   NA 140 142  --  143   K 4.5 4.6 4.4 6.6*   CL 110* 112*  --  115*   CO2 22 22  --  23   BUN 36* 35*  --  39*   CREA 1.7* 1.8*  --  2.0*   CA 8.9 9.2  --  9.4   AGAP 8 9  --  5   AP  --  138*  --   --    TP  --  7.7  --   --    ALB  --  3.0*  --  3.0*   ALT  --  71  --   --    SGOT  --  80*  --   --         Lab Results   Component Value Date/Time    Calcium 8.9 06/26/2018 03:06 AM    Phosphorus 4.0 06/24/2018 03:13 AM         Horace Porteous, MD  Jun 26, 2018 9:51 AM  [employer],  [phone]

## 2018-06-26 NOTE — Discharge Summary (Signed)
[facility]  Discharge Summary   NAME:  Tommy Brown, Tommy Brown  SEX:   M  ADMIT: 06/22/2018  [place]:   DOB:   March 22, 1945  MR#    [id number]  ACCT#  [id number]    cc: Leron Croak MD, Rae Lips MD, Edison Pace MD    DATE OF ADMISSION:  06/22/2018     DATE OF DISCHARGE AND DICTATION:  06/26/2018     Please refer to admission history and physical by Dr. Robie Ridge as well as consultations by Dr. Lyn Hollingshead (nephrology) and Dr. Claiborne Billings (urology) for pertinent past medical problems as well as details leading to this hospital stay.     Briefly, the patient is a 73 year old male with multiple medical problems as noted below.  Of most significance is chronic kidney disease (baseline creatinine around 1.5), coronary artery disease (status post coronary bypass graft surgery), history of pancreatic cancer (status post Whipple procedure and radiation), BPH, nephrolithiasis.  He notes he has been feeling poorly over the prior week or so prior to presentation.  Also, recently diagnosed with a kidney stone and had been taking Toradol, p.o. at home.  He was referred to the Emergency Department because of abnormal laboratory studies (drawn by urology) with a serum creatinine of 3.  Main complaint was general malaise.  No nausea or vomiting.  No diarrhea.  Upon presentation, he had a temperature of 98.1, blood pressure 154/79, pulse in the 60s, O2 sat was 98%.  Initial laboratory studies showed WBC 7.4, hemoglobin 9.2, hematocrit 29.9, platelet 276.  Urinalysis was unremarkable.  Sodium 140, potassium 5.1, chloride 113, CO2 19, glucose 124, BUN 60, creatinine 3.7 (again baseline is around 1.5).  A CT scan of the abdomen and pelvis showed \\"obstructing 8 mm calculus in the left proximal ureter causing moderate left hydronephrosis.  Multiple additional nonobstructing bilateral renal calculi.  Multiple bilateral renal cysts.  Diverticulosis.  Postop changes from prior Whipple procedure.\\"  He was admitted for further evaluation and treatment of acute kidney injury, chronic kidney disease  - stage III, obstructive uropathy.    He was seen and followed by Dr. Lyn Hollingshead from nephrology and Dr. Claiborne Billings from urology.  He was empirically started on antibiotics for possible urinary tract infection and/or and his urine culture ultimately grew 30,000 E. coli, this was resistant to fluoroquinolones, sensitive to first and all cephalosporins.  He was started initially on IV ceftriaxone.     His potassium actually did increase, it peaked at 6.7 and he was treated with Veltassa.  His peak serum creatinine was 3.7 from admission.    On 06/22/2018 he was brought to the operating room and underwent cystoscopy, left retrograde pyelogram with intraoperative interpretation and left ureteral stent placement by Dr. Claiborne Billings.    Post-procedure, his renal function and potassium were monitored closely, he once again had a bump in his creatinine to 6.6 on 06/24/2018, Dr. Lyn Hollingshead thought that heparin might be contributing this and this was discontinued.  Subsequent potassiums have been normal.    Currently, he has been afebrile through hospital stay.  Vital signs are stable.  Tolerating p.o.  He feels much improved as far as the generalized malaise.  His Foley catheter was discontinued the day prior to discharge and he is voiding without difficulties and postvoid residual has been 0.  He is currently medically stable for discharge.    MOST RECENT LABORATORY STUDIES:  On 06/25/2018 WBC 7, hemoglobin 9.1, hematocrit 29.5, platelets 257.  On 06/26/2018 sodium 140, potassium 4.5,  chloride 110, CO2 22, glucose 102, BUN 36, creatinine 1.7.    DISCHARGE DIAGNOSES:  1.  Acute kidney injury, chronic kidney disease - stage III secondary to obstructive uropathy and possibly NSAID contributed.  2.  Hyperkalemia, resolved.  3.  Obstructive uropathy with left hydronephrosis from proximal ureteral stone, stent placed 06/22/2018.  4.  Nephrolithiasis.  5.  Metabolic acidosis, present on admission, resolved.  6.  Hypertension.  7.  Coronary artery disease, status post coronary bypass graft surgery in the distant past.  8.  History of pancreatic cancer, status post Whipple procedure and XRT.  9.  Benign prostatic hypertrophy.  10.  Dyslipidemia.  11.  Urinary tract infection.    DISCHARGE MEDICATIONS:   Keflex 500 mg b.i.d. times 5 more days.  He should discontinue ketorolac.  Continue allopurinol 300 mg daily, aspirin 81 mg daily, Flomax 0.4 mg daily, Flonase 2 sprays each nostril daily, metoprolol 25 mg daily, Neurontin 600 mg b.i.d., Norvasc 5 mg daily, testosterone injections, Zanaflex 4 mg at bedtime, pancreatic enzyme before meals.    He should follow up with Dr. Cala Bradford in 5 to 7 days to check a BMP.  Follow up with Dr. Donovan Kail in 2 weeks.  Follow up Dr. Lyn Hollingshead if needed.    Total time to coordinate discharge was 40 minutes.      ___________________  Blase Mess MD  Dictated By: .   mad  D:06/26/2018 54:27:06  T: 06/26/2018 23:76:28  [id number]

## 2018-06-26 NOTE — Progress Notes (Signed)
Patient discharged and left [facility] prior to CM discharge assessment - plan was home with no needs.

## 2018-06-26 NOTE — Progress Notes (Signed)
Patient discharged and left [facility] prior to CM discharge assessment - plan was home with no needs.

## 2018-06-26 NOTE — Progress Notes (Signed)
Progress Notes by Horace Porteous, MD at 06/26/18 [phone]                Author: Horace Porteous, MD  Service: Nephrology  Author Type: Physician       Filed: 06/26/18 0859  Date of Service: 06/26/18 0859  Status: Signed          Editor: Horace Porteous, MD (Physician)                          Renal Progress Note           Patient: Tommy Brown  Age: 73 y.o.  Sex: male          Date of Birth: 07-Dec-1945  Admit Date: 06/22/2018  PCP: Daralene Milch, MD     MRN: [id number]   CSN: [id number]                Assessment     1.  Acute kidney injury.  Getting better with good UOP.   2.  Hyperkalemia.  Resolved.  This may have been the affect of heparin.   3.  Postop day 4  status post left ureteral stenting due to obstructing stone        Plan     1.  Agree with DC.   2.  I will arrange follow up with me as well.        Subjective     No SOB, CP.  Nausea still present but overall better.        Review of Systems     No fever, chills   No chest pain, palpitations   No SOB, cough, hemoptysis   No abdominal pain, no vomiting/diarrhea/constipation.  Positive for nausea   Urinating without difficulty, no dysuria, no hematuria   No LE edema   No anxiety or depression         Objective        Visit Vitals      BP  131/60 (BP 1 Location: Left arm, BP Patient Position: Supine)     Pulse  77     Temp  97.7 ??F (36.5 ??C)     Resp  18     Ht  5\' 8"  (1.727 m)     Wt  65.6 kg (144 lb 10 oz)     SpO2  97%        BMI  21.99 kg/m??           Intake/Output Summary (Last 24 hours) at 06/26/2018 0859   Last data filed at 06/26/2018 0540     Gross per 24 hour        Intake  580 ml        Output  550 ml        Net  30 ml             Physical Assessment     XKG:YJEH, NAD   EYES: sclera clear   Neck:  Without JVD or adenopathy   LUNGS:Clear to Auscultation, No Rales or Rhonchi.Good Air Entry   CVS EXM: Heart RRR, No S4 or S3 Gallop, No Pericardial Rub.   Abdomen: Soft, Non Tender, No CVA tenderness. Bowel Sounds normal.    Extremities: No Pitting Edema. No deformities   SKIN:No Rashes,Normal Turgor   Neurological exam:Awake alert and conversant.         Medications  Current Facility-Administered Medications          Medication  Dose  Route  Frequency           ?  cephALEXin (KEFLEX) capsule 500 mg   500 mg  Oral  Q12H     ?  famotidine (PF) (PEPCID) 20 mg in 0.9% sodium chloride 10 mL injection   20 mg  IntraVENous  DAILY     ?  promethazine (PHENERGAN) 12.5 mg in NS 50 mL IVPB   12.5 mg  IntraVENous  Q6H PRN     ?  melatonin tablet 3 mg   3 mg  Oral  QHS PRN     ?  polyvinyl alcohol-povidon(PF) (REFRESH CLASSIC) 1.4-0.6 % ophthalmic solution 2 Drop   2 Drop  Both Eyes  Q6H PRN     ?  amLODIPine (NORVASC) tablet 5 mg   5 mg  Oral  DAILY     ?  aspirin chewable tablet 81 mg   81 mg  Oral  DAILY     ?  metoprolol succinate (TOPROL-XL) XL tablet 25 mg   25 mg  Oral  DAILY     ?  tamsulosin (FLOMAX) capsule 0.4 mg   0.4 mg  Oral  DAILY           ?  naloxone (NARCAN) injection 0.1 mg   0.1 mg  IntraVENous  PRN           ?  acetaminophen (TYLENOL) tablet 650 mg   650 mg  Oral  Q4H PRN          Or           ?  acetaminophen (TYLENOL) solution 650 mg   650 mg  Oral  Q4H PRN          Or           ?  acetaminophen (TYLENOL) suppository 650 mg   650 mg  Rectal  Q4H PRN     ?  ondansetron (ZOFRAN) injection 4 mg   4 mg  IntraVENous  Q4H PRN           ?  polyethylene glycol (MIRALAX) packet 17 g   17 g  Oral  DAILY PRN                Laboratory:          CBC w/Diff     Recent Labs         06/25/18   0225  06/24/18   0313      WBC  7.0  8.0      RBC  3.43*  3.30*      HGB  9.1*  8.6*      HCT  29.5*  28.9*      PLT  257  282                Recent Labs         06/26/18   0306  06/25/18   0225  06/24/18   0925  06/24/18   0313      GLU  102  105   --   111*      NA  140  142   --   143      K  4.5  4.6  4.4  6.6*      CL  110*  112*   --   115*      CO2  22  22   --   23      BUN  36*  35*   --   39*      CREA  1.7*  1.8*   --   2.0*      CA   8.9  9.2   --   9.4      AGAP  8  9   --   5      AP   --   138*   --    --       TP   --   7.7   --    --       ALB   --   3.0*   --   3.0*      ALT   --   71   --    --       SGOT   --   80*   --    --                   Lab Results         Component  Value  Date/Time            Calcium  8.9  06/26/2018 03:06 AM            Phosphorus  4.0  06/24/2018 03:13 AM              Horace Porteous, MD   Jun 26, 2018 9:51 AM   [employer],   [phone]

## 2018-06-26 NOTE — Discharge Summary (Signed)
[place]  Discharge Summary   NAME:  Tommy Brown, Tommy Brown  SEX:   M  ADMIT: 06/22/2018  [place]:   DOB:   February 22, 1946  MR#    [id number]  ACCT#  [id number]    cc: Leron Croak MD, Rae Lips MD, Edison Pace MD    DATE OF ADMISSION:  06/22/2018     DATE OF DISCHARGE AND DICTATION:  06/26/2018     Please refer to admission history and physical by Dr. Robie Ridge as well as consultations by Dr. Lyn Hollingshead (nephrology) and Dr. Claiborne Billings (urology) for pertinent past medical problems as well as details leading to this hospital stay.    Briefly, the patient is a 73 year old male with multiple medical problems as noted below.  Of most significance is chronic kidney disease (baseline creatinine around 1.5), coronary artery disease (status post coronary bypass graft surgery), history of pancreatic cancer (status post Whipple procedure and radiation), BPH, nephrolithiasis.  He notes he has been feeling poorly over the prior week or so prior to presentation.  Also, recently diagnosed with a kidney stone and had been taking Toradol, p.o. at home.  He was referred to the Emergency Department because of abnormal laboratory studies (drawn by urology) with a serum creatinine of 3.  Main complaint was general malaise.  No nausea or vomiting.  No diarrhea.  Upon presentation, he had a temperature of 98.1, blood pressure 154/79, pulse in the 60s, O2 sat was 98%.  Initial laboratory studies showed WBC 7.4, hemoglobin 9.2, hematocrit 29.9, platelet 276.  Urinalysis was unremarkable.  Sodium 140, potassium 5.1, chloride 113, CO2 19, glucose 124, BUN 60, creatinine 3.7 (again baseline is around 1.5).  A CT scan of the abdomen and pelvis showed \\"obstructing 8 mm calculus in the left proximal ureter causing moderate left hydronephrosis.  Multiple additional nonobstructing bilateral renal calculi.  Multiple bilateral renal cysts.  Diverticulosis.  Postop changes from prior Whipple procedure.\\"  He was admitted for  further evaluation and treatment of acute kidney injury, chronic kidney disease  - stage III, obstructive uropathy.    He was seen and followed by Dr. Lyn Hollingshead from nephrology and Dr. Claiborne Billings from urology.  He was empirically started on antibiotics for possible urinary tract infection and/or and his urine culture ultimately grew 30,000 E. coli, this was resistant to fluoroquinolones, sensitive to first and all cephalosporins.  He was started initially on IV ceftriaxone.    His potassium actually did increase, it peaked at 6.7 and he was treated with Veltassa.  His peak serum creatinine was 3.7 from admission.    On 06/22/2018 he was brought to the operating room and underwent cystoscopy, left retrograde pyelogram with intraoperative interpretation and left ureteral stent placement by Dr. Claiborne Billings.    Post-procedure, his renal function and potassium were monitored closely, he once again had a bump in his creatinine to 6.6 on 06/24/2018, Dr. Lyn Hollingshead thought that heparin might be contributing this and this was discontinued.  Subsequent potassiums have been normal.    Currently, he has been afebrile through hospital stay.  Vital signs are stable.  Tolerating p.o.  He feels much improved as far as the generalized malaise.  His Foley catheter was discontinued the day prior to discharge and he is voiding without difficulties and postvoid residual has been 0.  He is currently medically stable for discharge.    MOST RECENT LABORATORY STUDIES:  On 06/25/2018 WBC 7, hemoglobin 9.1, hematocrit 29.5, platelets 257.  On 06/26/2018 sodium 140, potassium 4.5, chloride  110, CO2 22, glucose 102, BUN 36, creatinine 1.7.    DISCHARGE DIAGNOSES:  1.  Acute kidney injury, chronic kidney disease - stage III secondary to obstructive uropathy and possibly NSAID contributed.  2.  Hyperkalemia, resolved.  3.  Obstructive uropathy with left hydronephrosis from proximal ureteral stone, stent placed 06/22/2018.  4.  Nephrolithiasis.  5.  Metabolic  acidosis, present on admission, resolved.  6.  Hypertension.  7.  Coronary artery disease, status post coronary bypass graft surgery in the distant past.  8.  History of pancreatic cancer, status post Whipple procedure and XRT.  9.  Benign prostatic hypertrophy.  10.  Dyslipidemia.  11.  Urinary tract infection.    DISCHARGE MEDICATIONS:  Keflex 500 mg b.i.d. times 5 more days.  He should discontinue ketorolac.  Continue allopurinol 300 mg daily, aspirin 81 mg daily, Flomax 0.4 mg daily, Flonase 2 sprays each nostril daily, metoprolol 25 mg daily, Neurontin 600 mg b.i.d., Norvasc 5 mg daily, testosterone injections, Zanaflex 4 mg at bedtime, pancreatic enzyme before meals.    He should follow up with Dr. Cala Bradford in 5 to 7 days to check a BMP.  Follow up with Dr. Donovan Kail in 2 weeks.  Follow up Dr. Lyn Hollingshead if needed.    Total time to coordinate discharge was 40 minutes.      ___________________  Blase Mess MD  Dictated By: .   mad  D:06/26/2018 82:42:35  T: 06/26/2018 36:14:43  [id number]

## 2018-06-29 NOTE — Telephone Encounter (Signed)
Mrs. Tommy Brown called the office to schedule the follow-up from [name] recent hospitalization and stent placement.  There is a note on his chart that a message was sent to [facility] to schedule follow-up with Dr. Donovan Kail.  I attempted to schedule the follow-up but Dr. Donovan Kail is on vacation in two weeks so Mrs. Tommy Brown is aware that a message will be sent to [facility] to give her a call to schedule follow-up.

## 2018-07-03 DIAGNOSIS — R944 Abnormal results of kidney function studies: Secondary | ICD-10-CM | POA: Diagnosis not present

## 2018-07-03 DIAGNOSIS — E291 Testicular hypofunction: Secondary | ICD-10-CM | POA: Diagnosis not present

## 2018-07-08 ENCOUNTER — Ambulatory Visit: Attending: Urology | Primary: Family Medicine

## 2018-07-08 ENCOUNTER — Ambulatory Visit: Admit: 2018-07-08 | Discharge: 2018-07-08 | Payer: MEDICARE | Attending: Urology | Primary: Family Medicine

## 2018-07-08 DIAGNOSIS — N201 Calculus of ureter: Secondary | ICD-10-CM

## 2018-07-08 NOTE — Progress Notes (Signed)
Telemedicine Visit  Tommy Brown  DOB Jul 01, 1945   Encounter Date: 07/08/2018       ASSESSMENT:   Encounter Diagnoses     ICD-10-CM ICD-9-CM   1. Left ureteral stone N20.1 592.1   2. Kidney stone on left side N20.0 592.0   3. Retained ureteral stent Z96.0 V43.89        - Left 73mm proximal ureteral stone with hydroureteronephrosis and 81mm LLP stone  ????????????????????????- s/p cystoscopy, left JJ stent on 06/22/2018 by Dr. Claiborne Billings   - AKI with baseline creatinine 1.5  ????????????????????????- Peak creatinine 3.7 (4/27)   - creat 1.32 on 07/03/18  ??- Hyperkalemia - K is 6.7, returned to 4.6 on 07/03/18  - Hx of nephrolithiasis s/p URS with LL in 02/2017  - Hx of ED with IPP  - Hx of Pancreatic cancer s/p Whipple in 2006, chemotherapy and radiation??    Schedule cysto/retrogrades/left URS with laser litho/L stent change.  Will plan to treat proximal and LLP stone at same time if possible.  Risks and benefits explained to patient.  Check preop urine culture.  Hold daily ASA for now.      Chief Complaint   Patient presents with   ??? Post OP Follow Up     Pt states he feels great, states he has no complaints.        HISTORY OF PRESENT ILLNESS:  Tommy Brown is a 73 y.o. male who presents in follow up for left ureteral stone.     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 Stone was 95%CaOx. 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     Presented to ER on 06/22/18 with left renal colic and noted to have AKI and hyperkalemia.  CT w/o contrast 06/22/18:  1. Obstructing 8 mm calculus in the left proximal ureter causing moderate left  hydronephrosis.  2.  Multiple additional nonobstructing bilateral renal calculi.  3.  Multiple bilateral renal cysts.  4.  Diverticulosis.  5.  Postoperative changes from prior Whipple procedure.  6.  Multiple hypoattenuating liver lesions, unchanged.    Urine culture 06/22/18:  30K E. Coli.   S/p cysto L stent placement by Dr. Claiborne Billings on 06/22/18.  Doing well postop without new complaints.        PMHx, PSHx, SOCHx, FAMHx:  Unchanged as documented on 06/22/18    Past Medical History:   Diagnosis Date   ??? Arthritis    ??? Benign prostate hyperplasia    ??? Bladder infection    ??? Chronic kidney disease    ??? Coronary arteriosclerosis    ??? Coronary atherosclerosis of artery bypass graft    ??? Decreased testosterone level    ??? Diverticular disease    ??? Essential hypertension    ??? Gout    ??? History of acute renal failure    ??? History of anemia    ??? History of malignant neoplasm of pancreas    ??? Hx of CABG    ??? Kidney stone    ??? Kidney stones    ??? Neuropathy    ??? Pancreatic cancer ([place])    ??? Sepsis ([place])    ??? Tremor      Past Surgical History:   Procedure Laterality Date   ??? HX ORTHOPAEDIC      back   ??? HX OTHER SURGICAL  2006    whipple procedure done for pancreatic cancer   ??? HX  UROLOGICAL  2012    Percutaneous extraction of a kidney stone w/ fragmentation procedure      Social History     Socioeconomic History   ??? Marital status: MARRIED     Spouse name: Not on file   ??? Number of children: Not on file   ??? Years of education: Not on file   ??? Highest education level: Not on file   Occupational History   ??? Not on file   Social Needs   ??? Financial resource strain: Not on file   ??? Food insecurity     Worry: Not on file     Inability: Not on file   ??? Transportation needs     Medical: Not on file     Non-medical: Not on file   Tobacco Use   ??? Smoking status: Never Smoker   ??? Smokeless tobacco: Never Used   Substance and Sexual Activity   ??? Alcohol use: Not Currently   ??? Drug use: Not Currently   ??? Sexual activity: Not Currently   Lifestyle   ??? Physical activity     Days per week: Not on file     Minutes per session: Not on file   ??? Stress: Not on file   Relationships   ??? Social Product manager on phone: Not on file     Gets together: Not on file     Attends religious service: Not on file      Active member of club or organization: Not on file     Attends meetings of clubs or organizations: Not on file     Relationship status: Not on file   ??? Intimate partner violence     Fear of current or ex partner: Not on file     Emotionally abused: Not on file     Physically abused: Not on file     Forced sexual activity: Not on file   Other Topics Concern   ??? Not on file   Social History Narrative    ** Merged History Encounter **          Family History   Problem Relation Age of Onset   ??? Alcohol abuse Father    ??? Heart Disease Brother    ??? Emphysema Brother      Allergies   Allergen Reactions   ??? Other Food Other (comments)     VISTAID  ANESTHESIA   ??? Codeine Itching and Swelling   ??? Oxycodone Anaphylaxis   ??? Hydroxyzine Pamoate Itching and Other (comments)     Other reaction(s): Unknown     ??? Meperidine Itching, Palpitations, Swelling and Other (comments)     Other reaction(s): Unknown     ??? Metoclopramide Itching, Other (comments) and Swelling     Other reaction(s): Other (See Comments)  Reaction unknown   Reaction unknown      ??? Morphine Other (comments) and Nausea and Vomiting   ??? Oxycodone-Acetaminophen Itching   ??? Prochlorperazine Itching, Other (comments) and Unknown (comments)     Other reaction(s): Unknown  Unsure of reaction     ??? Hydroxyzine Hcl Hives     Current Outpatient Medications   Medication Sig Dispense Refill   ??? metoprolol tartrate (LOPRESSOR) 25 mg tablet Take 25 mg by mouth daily.     ??? gabapentin (NEURONTIN) 600 mg tablet Take 600 mg by mouth two (2) times a day.     ??? amLODIPine (NORVASC) 5 mg tablet Take  5 mg by mouth daily.     ??? allopurinol (ZYLOPRIM) 300 mg tablet Take  by mouth daily.     ??? tamsulosin (FLOMAX) 0.4 mg capsule Take 0.4 mg by mouth daily.     ??? lipase-protease-amylase (ZENPEP) 20,000-63,000- 84,000 unit capsule Take 8 Caps by mouth daily.     ??? tiZANidine (ZANAFLEX) 4 mg tablet Take 4 mg by mouth nightly.      ??? fluticasone propionate 100 mcg/actuation dsdv Take  by inhalation.     ??? aspirin 81 mg chewable tablet Take 81 mg by mouth daily.     ??? TESTOSTERONE IM by IntraMUSCular route.         Review of Systems  Constitutional: Fever: No  Skin: Rash: No  HEENT: Hearing difficulty: No  Eyes: Blurred vision: No  Cardiovascular: Chest pain: No  Respiratory: Shortness of breath: No  Gastrointestinal: Nausea/vomiting: No  Musculoskeletal: Back pain: No  Neurological: Weakness: No  Psychological: Memory loss: No  Comments/additional findings:         REVIEW OF LABS AND IMAGING:    No urine specimen.     Final result (Exam End: 06/22/2018 12:08 PM) Provider Status: Open   Study Result     CT Abdomen and Pelvis without  ??  Indication: Left hydronephrosis. Evaluate for obstructing stone or lesion.  ??  Comparison: Ultrasound 06/22/2018. CT 05/21/2018.  ??  TECHNIQUE:   CT of the abdomen and pelvis WITHOUT intravenous contrast. Coronal and sagittal  reformations were obtained.      ??  All CT exams at this facility use one or more dose reduction techniques  including automatic exposure control, mA/kV adjustment per patient's size, or  iterative reconstruction technique. DICOM format imaged data is available to  non-affiliated external healthcare facilities or entities on a secure, media  free, reciprocal searchable basis with patient authorization for 12 months  following the date of the study.  ??  DISCUSSION:  ??  ABSENCE OF INTRAVENOUS CONTRAST DECREASES SENSITIVITY FOR DETECTION OF FOCAL  LESIONS AND VASCULAR PATHOLOGY.  ??  LOWER THORAX: Coronary artery calcifications.  ??  HEPATOBILIARY: Multiple subcentimeter foci of hypoattenuation, too small to  characterize but unchanged. Cholecystectomy. Left intrahepatic pneumobilia,  unchanged.  SPLEEN: No splenomegaly.  PANCREAS: Postoperative changes from prior Whipple procedure. No mass or duct  dilatation in the residual pancreatic tail.  ??  ADRENALS: No adrenal nodules.   KIDNEYS/URETERS: Multiple bilateral renal cysts. Multiple small (2 to 4 mm)  nonobstructing bilateral renal calculi. There is a 10 x 9 mm nonobstructing  calculus in the lower pole of the left kidney. Moderate left hydronephrosis and  hydroureter. There is a 8 x 7 mm obstructing calculus in the proximal left  ureter. No right hydronephrosis. No suspicious renal lesions.  PELVIC ORGANS/BLADDER: Enlarged prostate. Penile implant. The bladder is  unremarkable.  ??  PERITONEUM / RETROPERITONEUM: No free air or fluid.  LYMPH NODES: No lymphadenopathy.  VESSELS: Extensive atherosclerotic vascular calcifications. Infrarenal abdominal  aorta ectasia.  ??  GI TRACT: No distention or wall thickening. Diverticulosis. Normal appendix.  ??  BONES AND SOFT TISSUES: Posterior lumbar fixation at L4-5. Mild retrolisthesis  at L2-3 and L3-4. Moderate to advanced disc height loss at L2-3. Mild  spondylosis of the remaining lumbar spine.  ??  IMPRESSION  IMPRESSION:  ??  1.  Obstructing 8 mm calculus in the left proximal ureter causing moderate left  hydronephrosis.  2.  Multiple additional nonobstructing bilateral renal calculi.  3.  Multiple bilateral renal cysts.  4.  Diverticulosis.  5.  Postoperative changes from prior Whipple procedure.  6.  Multiple hypoattenuating liver lesions, unchanged.         A copy of today's office visit with all pertinent imaging results and labs were sent to the referring physician.    CC: Deguzman-Berube, Aundra Dubin, MD     Nils Flack, MD             This visit was conducted as a synchronous telemedicine service rendered via real-time interactive audio and video telecommunications system. On May 06, 2018, the [place] declared the COVID-19 (Novel Coronavirus) viral disease to be a pandemic. As a result of this emergency, a rapidly evolving situation, practice patterns for physicians, physician assistants, and nurse practitioners are shifting to accommodate the need to treat in conjunction with unprecedented guidance from federal, state, and local authorities??? which include, but are not limited to, self-quarantines and/or limiting physical proximity to others under any number of circumstances.  It is within this context (and with the understanding that this method of patient encounter is in the patient???s best interest as well as the health and safety of other patients and the public) that ???telehealth??? is being provided for this patient encounter rather than a face-to-face visit. This patient encounter is appropriate and reasonable under the circumstances given the patient???s particular presentation at this time. The patient has been advised of the potential risks and limitations of this mode of treatment (including, but not limited to, the absence of in-person examination) and has agreed to be treated in a remote fashion in spite of them. Any and all of the patient???s/patient???s family???s questions on this issue have been answered, and I have made no promises or guarantees to the patient. The patient has also been advised to contact this office for worsening conditions or problems, and seek emergency medical treatment and/or call 911 if the patient deems either necessary.

## 2018-07-08 NOTE — Progress Notes (Signed)
Progress  Notes by Nils Flack, MD at 07/08/18 1015                Author: Nils Flack, MD  Service: --  Author Type: Physician       Filed: 07/08/18 1045  Encounter Date: 07/08/2018  Status: Signed          Editor: Nils Flack, MD (Physician)                    Telemedicine Visit   Tommy Brown   DOB 1945-03-22    Encounter Date: 07/08/2018          ASSESSMENT:      Encounter Diagnoses              ICD-10-CM  ICD-9-CM          1.  Left ureteral stone  N20.1  592.1     2.  Kidney stone on left side  N20.0  592.0          3.  Retained ureteral stent  Z96.0  V43.89            - Left 20mm proximal ureteral stone with hydroureteronephrosis and 11mm LLP stone   ????????????????????????- s/p cystoscopy, left JJ stent on 06/22/2018 by Dr. Claiborne Billings    - AKI with baseline creatinine 1.5   ????????????????????????- Peak creatinine 3.7 (4/27)    - creat 1.32 on 07/03/18   ??- Hyperkalemia - K is 6.7, returned to 4.6 on 07/03/18   - Hx of nephrolithiasis s/p URS with LL in 02/2017   - Hx of ED with IPP   - Hx of Pancreatic cancer s/p Whipple in 2006, chemotherapy and radiation??      Schedule cysto/retrogrades/left URS with laser litho/L stent change.  Will plan to treat proximal and LLP stone at same time if possible.  Risks and benefits explained to patient.   Check preop urine culture.  Hold daily ASA for now.           Chief Complaint       Patient presents with        ?  Post OP Follow Up             Pt states he feels great, states he has no complaints.            HISTORY OF PRESENT ILLNESS:  Tommy Brown  is a 73 y.o. male who presents  in follow up for left ureteral stone.       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 Stone was 95%CaOx. 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       Presented to ER on 06/22/18 with left renal colic and noted to have AKI and hyperkalemia.   CT w/o contrast 06/22/18:   1. Obstructing 8 mm calculus in the left proximal ureter causing moderate  left   hydronephrosis.   2.  Multiple additional nonobstructing bilateral renal calculi.   3.  Multiple bilateral renal cysts.   4.  Diverticulosis.   5.  Postoperative changes from prior Whipple procedure.   6.  Multiple hypoattenuating liver lesions, unchanged.      Urine culture 06/22/18:  30K E. Coli.   S/p cysto L stent placement by Dr. Claiborne Billings on 06/22/18.  Doing well postop without new complaints.            PMHx, PSHx,  SOCHx, FAMHx:   Unchanged as documented on 06/22/18        Past Medical History:        Diagnosis  Date         ?  Arthritis       ?  Benign prostate hyperplasia       ?  Bladder infection       ?  Chronic kidney disease       ?  Coronary arteriosclerosis       ?  Coronary atherosclerosis of artery bypass graft       ?  Decreased testosterone level       ?  Diverticular disease       ?  Essential hypertension       ?  Gout       ?  History of acute renal failure       ?  History of anemia       ?  History of malignant neoplasm of pancreas       ?  Hx of CABG       ?  Kidney stone       ?  Kidney stones       ?  Neuropathy       ?  Pancreatic cancer ([place])       ?  Sepsis ([place])           ?  Tremor            Past Surgical History:         Procedure  Laterality  Date          ?  HX ORTHOPAEDIC              back          ?  HX OTHER SURGICAL    2006          whipple procedure done for pancreatic cancer          ?  HX UROLOGICAL    2012          Percutaneous extraction of a kidney stone w/ fragmentation procedure           Social History          Socioeconomic History         ?  Marital status:  MARRIED              Spouse name:  Not on file         ?  Number of children:  Not on file     ?  Years of education:  Not on file     ?  Highest education level:  Not on file       Occupational History        ?  Not on file       Social Needs         ?  Financial resource strain:  Not on file        ?  Food insecurity              Worry:  Not on file         Inability:  Not on file        ?  Transportation  needs              Medical:  Not on file         Non-medical:  Not on file  Tobacco Use         ?  Smoking status:  Never Smoker     ?  Smokeless tobacco:  Never Used       Substance and Sexual Activity         ?  Alcohol use:  Not Currently     ?  Drug use:  Not Currently     ?  Sexual activity:  Not Currently       Lifestyle        ?  Physical activity              Days per week:  Not on file         Minutes per session:  Not on file         ?  Stress:  Not on file       Relationships        ?  Social Health visitor on phone:  Not on file         Gets together:  Not on file         Attends religious service:  Not on file         Active member of club or organization:  Not on file         Attends meetings of clubs or organizations:  Not on file         Relationship status:  Not on file        ?  Intimate partner violence              Fear of current or ex partner:  Not on file         Emotionally abused:  Not on file         Physically abused:  Not on file         Forced sexual activity:  Not on file        Other Topics  Concern        ?  Not on file       Social History Narrative          ** Merged History Encounter **                     Family History         Problem  Relation  Age of Onset          ?  Alcohol abuse  Father       ?  Heart Disease  Brother            ?  Emphysema  Brother            Allergies        Allergen  Reactions         ?  Other Food  Other (comments)             VISTAID   ANESTHESIA         ?  Codeine  Itching and Swelling     ?  Oxycodone  Anaphylaxis     ?  Hydroxyzine Pamoate  Itching and Other (comments)             Other reaction(s): Unknown            ?  Meperidine  Itching, Palpitations, Swelling and Other (comments)  Other reaction(s): Unknown            ?  Metoclopramide  Itching, Other (comments) and Swelling             Other reaction(s): Other (See Comments)   Reaction unknown    Reaction unknown             ?  Morphine  Other (comments) and  Nausea and Vomiting     ?  Oxycodone-Acetaminophen  Itching     ?  Prochlorperazine  Itching, Other (comments) and Unknown (comments)             Other reaction(s): Unknown   Unsure of reaction            ?  Hydroxyzine Hcl  Hives          Current Outpatient Medications          Medication  Sig  Dispense  Refill           ?  metoprolol tartrate (LOPRESSOR) 25 mg tablet  Take 25 mg by mouth daily.         ?  gabapentin (NEURONTIN) 600 mg tablet  Take 600 mg by mouth two (2) times a day.         ?  amLODIPine (NORVASC) 5 mg tablet  Take 5 mg by mouth daily.         ?  allopurinol (ZYLOPRIM) 300 mg tablet  Take  by mouth daily.         ?  tamsulosin (FLOMAX) 0.4 mg capsule  Take 0.4 mg by mouth daily.         ?  lipase-protease-amylase (ZENPEP) 20,000-63,000- 84,000 unit capsule  Take 8 Caps by mouth daily.         ?  tiZANidine (ZANAFLEX) 4 mg tablet  Take 4 mg by mouth nightly.         ?  fluticasone propionate 100 mcg/actuation dsdv  Take  by inhalation.         ?  aspirin 81 mg chewable tablet  Take 81 mg by mouth daily.               ?  TESTOSTERONE IM  by IntraMUSCular route.               Review of Systems   Constitutional: Fever: No   Skin: Rash: No   HEENT: Hearing difficulty: No   Eyes: Blurred vision: No   Cardiovascular: Chest pain: No   Respiratory: Shortness of breath: No   Gastrointestinal: Nausea/vomiting: No   Musculoskeletal: Back pain: No   Neurological: Weakness: No   Psychological: Memory loss: No   Comments/additional findings:             REVIEW OF LABS AND IMAGING:     No urine specimen.          Final result (Exam End: 06/22/2018 12:08 PM)  Provider Status: Open     Study Result         CT Abdomen and Pelvis without   ??   Indication: Left hydronephrosis. Evaluate for obstructing stone or lesion.   ??   Comparison: Ultrasound 06/22/2018. CT 05/21/2018.   ??   TECHNIQUE:    CT of the abdomen and pelvis WITHOUT intravenous contrast. Coronal and sagittal   reformations were obtained.       ??   All CT  exams at this facility use one or more dose reduction techniques  including automatic exposure control, mA/kV adjustment per patient's size, or   iterative reconstruction technique. DICOM format imaged data is available to   non-affiliated external healthcare facilities or entities on a secure, media   free, reciprocal searchable basis with patient authorization for 12 months   following the date of the study.   ??   DISCUSSION:   ??   ABSENCE OF INTRAVENOUS CONTRAST DECREASES SENSITIVITY FOR DETECTION OF FOCAL   LESIONS AND VASCULAR PATHOLOGY.   ??   LOWER THORAX: Coronary artery calcifications.   ??   HEPATOBILIARY: Multiple subcentimeter foci of hypoattenuation, too small to   characterize but unchanged. Cholecystectomy. Left intrahepatic pneumobilia,   unchanged.   SPLEEN: No splenomegaly.   PANCREAS: Postoperative changes from prior Whipple procedure. No mass or duct   dilatation in the residual pancreatic tail.   ??   ADRENALS: No adrenal nodules.   KIDNEYS/URETERS: Multiple bilateral renal cysts. Multiple small (2 to 4 mm)   nonobstructing bilateral renal calculi. There is a 10 x 9 mm nonobstructing   calculus in the lower pole of the left kidney. Moderate left hydronephrosis and   hydroureter. There is a 8 x 7 mm obstructing calculus in the proximal left   ureter. No right hydronephrosis. No suspicious renal lesions.   PELVIC ORGANS/BLADDER: Enlarged prostate. Penile implant. The bladder is   unremarkable.   ??   PERITONEUM / RETROPERITONEUM: No free air or fluid.   LYMPH NODES: No lymphadenopathy.   VESSELS: Extensive atherosclerotic vascular calcifications. Infrarenal abdominal   aorta ectasia.   ??   GI TRACT: No distention or wall thickening. Diverticulosis. Normal appendix.   ??   BONES AND SOFT TISSUES: Posterior lumbar fixation at L4-5. Mild retrolisthesis   at L2-3 and L3-4. Moderate to advanced disc height loss at L2-3. Mild   spondylosis of the remaining lumbar spine.   ??   IMPRESSION   IMPRESSION:   ??    1.  Obstructing 8 mm calculus in the left proximal ureter causing moderate left   hydronephrosis.   2.  Multiple additional nonobstructing bilateral renal calculi.   3.  Multiple bilateral renal cysts.   4.  Diverticulosis.   5.  Postoperative changes from prior Whipple procedure.   6.  Multiple hypoattenuating liver lesions, unchanged.              A copy of today's office visit with all pertinent imaging results and labs were sent to the referring physician.     CC: Deguzman-Berube, Aundra Dubin, MD       Nils Flack, MD                  This visit was conducted as a synchronous telemedicine service rendered via real-time interactive audio and video telecommunications system. On May 06, 2018, the [place]  declared the COVID-19 (Novel Coronavirus) viral disease to be a pandemic. As a result of this emergency, a rapidly evolving situation, practice patterns for physicians, physician assistants, and nurse practitioners are shifting to accommodate the need  to treat in conjunction with unprecedented guidance from federal, state, and local authorities-- which include, but are not limited to, self-quarantines and/or limiting physical proximity to others under any number of circumstances.   It is within this context (and with the understanding that this method of patient encounter is in the patients best interest as well as the health and safety of other patients and the public) that telehealth is being provided  for this patient encounter rather than a face-to-face visit. This patient encounter is appropriate and reasonable under the circumstances given the patients particular presentation at this time. The patient has been advised of the potential risks  and limitations of this mode of treatment (including, but not limited to, the absence of in-person examination) and has agreed to be treated in a remote fashion in spite of them. Any and all of the patients/patients familys questions  on this issue have  been answered, and I have made no promises or guarantees to the patient. The patient has also been advised to contact this office for worsening conditions or problems, and seek emergency medical treatment and/or call 911 if the patient  deems either necessary.

## 2018-07-09 DIAGNOSIS — H2511 Age-related nuclear cataract, right eye: Secondary | ICD-10-CM | POA: Diagnosis not present

## 2018-07-09 DIAGNOSIS — S0501XD Injury of conjunctiva and corneal abrasion without foreign body, right eye, subsequent encounter: Secondary | ICD-10-CM | POA: Diagnosis not present

## 2018-07-09 DIAGNOSIS — Z961 Presence of intraocular lens: Secondary | ICD-10-CM | POA: Diagnosis not present

## 2018-07-09 DIAGNOSIS — H34831 Tributary (branch) retinal vein occlusion, right eye, with macular edema: Secondary | ICD-10-CM | POA: Diagnosis not present

## 2018-07-10 ENCOUNTER — Encounter: Payer: MEDICARE | Attending: Urology | Primary: Family Medicine

## 2018-07-15 ENCOUNTER — Emergency Department: Admit: 2018-07-15 | Payer: MEDICARE | Primary: Family Medicine

## 2018-07-15 ENCOUNTER — Inpatient Hospital Stay
Admit: 2018-07-15 | Discharge: 2018-07-17 | Disposition: A | Discharge status: Home / Self Care | Payer: MEDICARE | Attending: Internal Medicine | Admitting: Internal Medicine

## 2018-07-15 ENCOUNTER — Inpatient Hospital Stay: Admit: 2018-07-15 | Payer: MEDICARE | Primary: Family Medicine

## 2018-07-15 DIAGNOSIS — N136 Pyonephrosis: Principal | ICD-10-CM

## 2018-07-15 DIAGNOSIS — Z466 Encounter for fitting and adjustment of urinary device: Secondary | ICD-10-CM | POA: Diagnosis not present

## 2018-07-15 DIAGNOSIS — I2510 Atherosclerotic heart disease of native coronary artery without angina pectoris: Secondary | ICD-10-CM | POA: Diagnosis not present

## 2018-07-15 DIAGNOSIS — N189 Chronic kidney disease, unspecified: Secondary | ICD-10-CM | POA: Diagnosis not present

## 2018-07-15 DIAGNOSIS — Z87442 Personal history of urinary calculi: Secondary | ICD-10-CM | POA: Diagnosis not present

## 2018-07-15 DIAGNOSIS — R509 Fever, unspecified: Secondary | ICD-10-CM | POA: Diagnosis not present

## 2018-07-15 DIAGNOSIS — E872 Acidosis: Secondary | ICD-10-CM | POA: Diagnosis not present

## 2018-07-15 DIAGNOSIS — N1330 Unspecified hydronephrosis: Secondary | ICD-10-CM | POA: Diagnosis not present

## 2018-07-15 DIAGNOSIS — I129 Hypertensive chronic kidney disease with stage 1 through stage 4 chronic kidney disease, or unspecified chronic kidney disease: Secondary | ICD-10-CM | POA: Diagnosis not present

## 2018-07-15 DIAGNOSIS — G629 Polyneuropathy, unspecified: Secondary | ICD-10-CM | POA: Diagnosis present

## 2018-07-15 DIAGNOSIS — I120 Hypertensive chronic kidney disease with stage 5 chronic kidney disease or end stage renal disease: Secondary | ICD-10-CM | POA: Diagnosis not present

## 2018-07-15 DIAGNOSIS — Z8507 Personal history of malignant neoplasm of pancreas: Secondary | ICD-10-CM | POA: Diagnosis not present

## 2018-07-15 DIAGNOSIS — E785 Hyperlipidemia, unspecified: Secondary | ICD-10-CM | POA: Diagnosis present

## 2018-07-15 DIAGNOSIS — N400 Enlarged prostate without lower urinary tract symptoms: Secondary | ICD-10-CM | POA: Diagnosis not present

## 2018-07-15 DIAGNOSIS — Z1623 Resistance to quinolones and fluoroquinolones: Secondary | ICD-10-CM | POA: Diagnosis not present

## 2018-07-15 DIAGNOSIS — Z7982 Long term (current) use of aspirin: Secondary | ICD-10-CM | POA: Diagnosis not present

## 2018-07-15 DIAGNOSIS — A419 Sepsis, unspecified organism: Secondary | ICD-10-CM | POA: Diagnosis not present

## 2018-07-15 DIAGNOSIS — Z951 Presence of aortocoronary bypass graft: Secondary | ICD-10-CM | POA: Diagnosis not present

## 2018-07-15 DIAGNOSIS — N183 Chronic kidney disease, stage 3 (moderate): Secondary | ICD-10-CM | POA: Diagnosis not present

## 2018-07-15 DIAGNOSIS — Z960 Presence of urogenital implants: Secondary | ICD-10-CM | POA: Diagnosis not present

## 2018-07-15 DIAGNOSIS — Z79899 Other long term (current) drug therapy: Secondary | ICD-10-CM | POA: Diagnosis not present

## 2018-07-15 DIAGNOSIS — R5381 Other malaise: Secondary | ICD-10-CM | POA: Diagnosis not present

## 2018-07-15 DIAGNOSIS — N069 Isolated proteinuria with unspecified morphologic lesion: Secondary | ICD-10-CM | POA: Diagnosis not present

## 2018-07-15 DIAGNOSIS — N390 Urinary tract infection, site not specified: Secondary | ICD-10-CM | POA: Diagnosis not present

## 2018-07-15 DIAGNOSIS — N179 Acute kidney failure, unspecified: Secondary | ICD-10-CM | POA: Diagnosis present

## 2018-07-15 DIAGNOSIS — Z9889 Other specified postprocedural states: Secondary | ICD-10-CM | POA: Diagnosis not present

## 2018-07-15 DIAGNOSIS — I25810 Atherosclerosis of coronary artery bypass graft(s) without angina pectoris: Secondary | ICD-10-CM | POA: Diagnosis not present

## 2018-07-15 DIAGNOSIS — N132 Hydronephrosis with renal and ureteral calculous obstruction: Secondary | ICD-10-CM | POA: Diagnosis not present

## 2018-07-15 DIAGNOSIS — M109 Gout, unspecified: Secondary | ICD-10-CM | POA: Diagnosis not present

## 2018-07-15 DIAGNOSIS — B9620 Unspecified Escherichia coli [E. coli] as the cause of diseases classified elsewhere: Secondary | ICD-10-CM | POA: Diagnosis not present

## 2018-07-15 LAB — POC URINE MACROSCOPIC
Bilirubin, Urine: NEGATIVE
Bilirubin: NEGATIVE
Glucose, Ur: NEGATIVE mg/dl
Glucose: NEGATIVE mg/dl
Ketone: NEGATIVE mg/dl
Ketones, Urine: NEGATIVE mg/dl
Nitrite, Urine: NEGATIVE
Nitrites: NEGATIVE
Protein, UA: 100 mg/dl — AB
Protein: 100 mg/dl — AB
Specific Gravity, UA: 1.02 (ref 1.005–1.030)
Specific gravity: 1.02 (ref 1.005–1.030)
Urobilinogen, UA, POCT: 0.2 EU/dl (ref 0.0–1.0)
Urobilinogen: 0.2 EU/dl (ref 0.0–1.0)
pH (UA): 5.5 (ref 5–9)
pH, UA: 5.5 (ref 5–9)

## 2018-07-15 LAB — METABOLIC PANEL, COMPREHENSIVE
ALT (SGPT): 40 U/L (ref 12–78)
AST (SGOT): 35 U/L (ref 15–37)
Albumin: 2.7 gm/dl — ABNORMAL LOW (ref 3.4–5.0)
Alk. phosphatase: 190 U/L — ABNORMAL HIGH (ref 45–117)
Anion gap: 8 mmol/L (ref 5–15)
BUN: 36 mg/dl — ABNORMAL HIGH (ref 7–25)
Bilirubin, total: 0.7 mg/dl (ref 0.2–1.0)
CO2: 21 mEq/L (ref 21–32)
Calcium: 9 mg/dl (ref 8.5–10.1)
Chloride: 108 mEq/L — ABNORMAL HIGH (ref 98–107)
Creatinine: 1.8 mg/dl — ABNORMAL HIGH (ref 0.6–1.3)
GFR est AA: 48
GFR est non-AA: 39
Glucose: 125 mg/dl — ABNORMAL HIGH (ref 74–106)
Potassium: 4.5 mEq/L (ref 3.5–5.1)
Protein, total: 8 gm/dl (ref 6.4–8.2)
Sodium: 137 mEq/L (ref 136–145)

## 2018-07-15 LAB — CBC WITH AUTOMATED DIFF
BASOPHILS: 0.3 % (ref 0–3)
EOSINOPHILS: 0.7 % (ref 0–5)
HCT: 28.2 % — ABNORMAL LOW (ref 37.0–50.0)
HGB: 8.7 gm/dl — ABNORMAL LOW (ref 12.4–17.2)
IMMATURE GRANULOCYTES: 0.5 % (ref 0.0–3.0)
LYMPHOCYTES: 8.5 % — ABNORMAL LOW (ref 28–48)
MCH: 27.5 pg (ref 23.0–34.6)
MCHC: 30.9 gm/dl (ref 30.0–36.0)
MCV: 89.2 fL (ref 80.0–98.0)
MONOCYTES: 8.2 % (ref 1–13)
MPV: 12 fL — ABNORMAL HIGH (ref 6.0–10.0)
NEUTROPHILS: 81.8 % — ABNORMAL HIGH (ref 34–64)
NRBC: 0 (ref 0–0)
PLATELET: 177 10*3/uL (ref 140–450)
RBC: 3.16 M/uL — ABNORMAL LOW (ref 3.80–5.70)
RDW-SD: 62.3 — ABNORMAL HIGH (ref 35.1–43.9)
WBC: 7.7 10*3/uL (ref 4.0–11.0)

## 2018-07-15 LAB — PROCALCITONIN
PROCALCITONIN: 0.51 ng/ml — ABNORMAL HIGH (ref 0.00–0.50)
PROCALCITONIN: 0.51 ng/ml — ABNORMAL HIGH (ref 0.00–0.50)

## 2018-07-15 LAB — URINALYSIS W/ RFLX MICROSCOPIC
Bilirubin, Urine: NEGATIVE
Bilirubin: NEGATIVE
Glucose, Ur: NEGATIVE mg/dl
Glucose: NEGATIVE mg/dl
Ketone: NEGATIVE mg/dl
Ketones, Urine: NEGATIVE mg/dl
Nitrite, Urine: NEGATIVE
Nitrites: NEGATIVE
Protein, UA: 30 mg/dl — AB
Protein: 30 mg/dl — AB
Specific Gravity, UA: 1.02 (ref 1.005–1.030)
Specific gravity: 1.02 (ref 1.005–1.030)
Urobilinogen, UA, POCT: 0.2 mg/dl (ref 0.0–1.0)
Urobilinogen: 0.2 mg/dl (ref 0.0–1.0)
pH (UA): 5.5 (ref 5.0–9.0)
pH, UA: 5.5 (ref 5.0–9.0)

## 2018-07-15 LAB — POC URINE MICROSCOPIC
WBC, UA: >50 /HPF
WBC: >50 /HPF

## 2018-07-15 LAB — LACTIC ACID
LACTIC ACID: 1.9 mmol/L (ref 0.4–2.0)
LACTIC ACID: 2.2 mmol/L — ABNORMAL HIGH (ref 0.4–2.0)
Lactic Acid: 1.9 mmol/L (ref 0.4–2.0)
Lactic Acid: 2.2 mmol/L — ABNORMAL HIGH (ref 0.4–2.0)

## 2018-07-15 LAB — COVID-19 (INPATIENT TESTING): COVID-19: NEGATIVE

## 2018-07-15 LAB — CBC WITH AUTO DIFFERENTIAL
Basophils %: 0.3 % (ref 0–3)
Eosinophils %: 0.7 % (ref 0–5)
Hematocrit: 28.2 % — ABNORMAL LOW (ref 37.0–50.0)
Hemoglobin: 8.7 gm/dl — ABNORMAL LOW (ref 12.4–17.2)
Immature Granulocytes: 0.5 % (ref 0.0–3.0)
Lymphocytes %: 8.5 % — ABNORMAL LOW (ref 28–48)
MCH: 27.5 pg (ref 23.0–34.6)
MCHC: 30.9 gm/dl (ref 30.0–36.0)
MCV: 89.2 fL (ref 80.0–98.0)
MPV: 12 fL — ABNORMAL HIGH (ref 6.0–10.0)
Monocytes %: 8.2 % (ref 1–13)
Neutrophils %: 81.8 % — ABNORMAL HIGH (ref 34–64)
Nucleated RBCs: 0 (ref 0–0)
Platelets: 177 10*3/uL (ref 140–450)
RBC: 3.16 M/uL — ABNORMAL LOW (ref 3.80–5.70)
RDW-SD: 62.3 — ABNORMAL HIGH (ref 35.1–43.9)
WBC: 7.7 10*3/uL (ref 4.0–11.0)

## 2018-07-15 LAB — COMPREHENSIVE METABOLIC PANEL
ALT: 40 U/L (ref 12–78)
AST: 35 U/L (ref 15–37)
Albumin: 2.7 gm/dl — ABNORMAL LOW (ref 3.4–5.0)
Alkaline Phosphatase: 190 U/L — ABNORMAL HIGH (ref 45–117)
Anion Gap: 8 mmol/L (ref 5–15)
BUN: 36 mg/dl — ABNORMAL HIGH (ref 7–25)
CO2: 21 mEq/L (ref 21–32)
Calcium: 9 mg/dl (ref 8.5–10.1)
Chloride: 108 mEq/L — ABNORMAL HIGH (ref 98–107)
Creatinine: 1.8 mg/dl — ABNORMAL HIGH (ref 0.6–1.3)
EGFR IF NonAfrican American: 39
GFR African American: 48
Glucose: 125 mg/dl — ABNORMAL HIGH (ref 74–106)
Potassium: 4.5 mEq/L (ref 3.5–5.1)
Sodium: 137 mEq/L (ref 136–145)
Total Bilirubin: 0.7 mg/dl (ref 0.2–1.0)
Total Protein: 8 gm/dl (ref 6.4–8.2)

## 2018-07-15 LAB — COVID-19: COVID-19: NEGATIVE

## 2018-07-15 MED ORDER — ASPIRIN 81 MG CHEWABLE TAB
81 mg | Freq: Every day | ORAL | Status: DC
Start: 2018-07-15 — End: 2018-07-17
  Administered 2018-07-16 – 2018-07-17 (×2): via ORAL

## 2018-07-15 MED ORDER — ALLOPURINOL 300 MG TAB
300 mg | Freq: Every day | ORAL | Status: DC
Start: 2018-07-15 — End: 2018-07-17
  Administered 2018-07-16 – 2018-07-17 (×2): via ORAL

## 2018-07-15 MED ORDER — TAMSULOSIN SR 0.4 MG 24 HR CAP
0.4 mg | Freq: Every day | ORAL | Status: DC
Start: 2018-07-15 — End: 2018-07-17
  Administered 2018-07-16 – 2018-07-17 (×2): via ORAL

## 2018-07-15 MED ORDER — SODIUM CHLORIDE 0.9% BOLUS IV
0.9 % | Freq: Once | INTRAVENOUS | Status: DC
Start: 2018-07-15 — End: 2018-07-15
  Administered 2018-07-15: 18:00:00 via INTRAVENOUS

## 2018-07-15 MED ORDER — ZOLPIDEM 5 MG TAB
5 mg | Freq: Every evening | ORAL | Status: DC | PRN
Start: 2018-07-15 — End: 2018-07-17
  Administered 2018-07-16 – 2018-07-17 (×2): via ORAL

## 2018-07-15 MED ORDER — SODIUM CHLORIDE 0.9 % IV PIGGY BACK
2 gram | INTRAVENOUS | Status: AC
Start: 2018-07-15 — End: 2018-07-15
  Administered 2018-07-15: 20:00:00 via INTRAVENOUS

## 2018-07-15 MED ORDER — NALOXONE 0.4 MG/ML INJECTION
0.4 mg/mL | INTRAMUSCULAR | Status: DC | PRN
Start: 2018-07-15 — End: 2018-07-17

## 2018-07-15 MED ORDER — DEXTROSE 5%-1/2 NORMAL SALINE IV
INTRAVENOUS | Status: DC
Start: 2018-07-15 — End: 2018-07-17
  Administered 2018-07-15 – 2018-07-17 (×4): via INTRAVENOUS

## 2018-07-15 MED ORDER — CEFTRIAXONE 1 GRAM SOLUTION FOR INJECTION
1 gram | INTRAMUSCULAR | Status: DC
Start: 2018-07-15 — End: 2018-07-17
  Administered 2018-07-16 – 2018-07-17 (×2): via INTRAVENOUS

## 2018-07-15 MED ORDER — SODIUM CHLORIDE 0.9% BOLUS IV
0.9 % | Freq: Once | INTRAVENOUS | Status: AC
Start: 2018-07-15 — End: 2018-07-15
  Administered 2018-07-15: 19:00:00 via INTRAVENOUS

## 2018-07-15 MED ORDER — AMLODIPINE 5 MG TAB
5 mg | Freq: Every day | ORAL | Status: DC
Start: 2018-07-15 — End: 2018-07-17
  Administered 2018-07-16 – 2018-07-17 (×2): via ORAL

## 2018-07-15 MED ORDER — GABAPENTIN 300 MG CAP
300 mg | Freq: Two times a day (BID) | ORAL | Status: DC
Start: 2018-07-15 — End: 2018-07-17
  Administered 2018-07-16 – 2018-07-17 (×4): via ORAL

## 2018-07-15 MED ORDER — LIPASE-PROTEASE-AMYLASE 24,000-76,000-120,000 UNIT CAP, DELAY REL
24000-76000 -120,000 unit | Freq: Four times a day (QID) | ORAL | Status: DC
Start: 2018-07-15 — End: 2018-07-17
  Administered 2018-07-16 – 2018-07-17 (×8): via ORAL

## 2018-07-15 MED ORDER — METOPROLOL SUCCINATE SR 25 MG 24 HR TAB
25 mg | Freq: Every day | ORAL | Status: DC
Start: 2018-07-15 — End: 2018-07-17
  Administered 2018-07-16 – 2018-07-17 (×2): via ORAL

## 2018-07-15 MED ORDER — SODIUM CHLORIDE 0.9 % IJ SYRG
Freq: Three times a day (TID) | INTRAMUSCULAR | Status: DC
Start: 2018-07-15 — End: 2018-07-17
  Administered 2018-07-15 – 2018-07-17 (×7): via INTRAVENOUS

## 2018-07-15 MED ORDER — SODIUM CHLORIDE 0.9 % IJ SYRG
INTRAMUSCULAR | Status: DC | PRN
Start: 2018-07-15 — End: 2018-07-17

## 2018-07-15 MED ORDER — ACETAMINOPHEN 1,000 MG/100 ML (10 MG/ML) IV
1000 mg/100 mL (10 mg/mL) | Freq: Once | INTRAVENOUS | Status: AC
Start: 2018-07-15 — End: 2018-07-16
  Administered 2018-07-15: 23:00:00 via INTRAVENOUS

## 2018-07-15 MED ORDER — ACETAMINOPHEN 325 MG TABLET
325 mg | Freq: Four times a day (QID) | ORAL | Status: DC | PRN
Start: 2018-07-15 — End: 2018-07-17
  Administered 2018-07-15 – 2018-07-17 (×6): via ORAL

## 2018-07-15 MED FILL — CREON 24,000-76,000-120,000 UNIT CAPSULE,DELAYED RELEASE: 24000-76000 -120,000 unit | ORAL | Qty: 2

## 2018-07-15 MED FILL — TYLENOL 325 MG TABLET: 325 mg | ORAL | Qty: 2

## 2018-07-15 MED FILL — DEXTROSE 5%-1/2 NORMAL SALINE IV: INTRAVENOUS | Qty: 1000

## 2018-07-15 MED FILL — OFIRMEV 1,000 MG/100 ML (10 MG/ML) INTRAVENOUS SOLUTION: 1000 mg/100 mL (10 mg/mL) | INTRAVENOUS | Qty: 100

## 2018-07-15 MED FILL — CEFTRIAXONE 2 GRAM SOLUTION FOR INJECTION: 2 gram | INTRAMUSCULAR | Qty: 2

## 2018-07-15 NOTE — Progress Notes (Signed)
prelim code r note  ??  Last admission date 4/27-5/1    reason for last admission aki    MD odonnell  ??  disposition at discharge home  ??  Insurance medicare a&b, generic commercial  ??

## 2018-07-15 NOTE — ED Triage Notes (Signed)
Pt arrived to ER as a referral from [facility] for a urine culture and COVID screening. Pt denies any fevers today, but has had a fever in the past weeks. Pt denies any coughs.

## 2018-07-15 NOTE — Progress Notes (Signed)
Pt educated on having to insert foley catheter. Pt refusing insertion due to pain. Pt stated "I will not get it inserted unless I receive lidocaine". Pt educated that if foley is not inserted post void checks will be continued. Pt agreed. Charge nurse informed of situation.

## 2018-07-15 NOTE — ED Provider Notes (Addendum)
[facility]  Emergency Department Treatment Report        Patient: Tommy Brown Age: 73 y.o. Sex: male    Date of Birth: Mar 12, 1945 Admit Date: 07/15/2018 PCP: Daralene Milch, MD   MRN: [id number]  CSN: [id number]     Room: ER08/ER08 Time Dictated: 1:38 PM      Attending MD: Maylon Cos, MD  APC:  Maryanna Shape PA-C    Chief Complaint   Chills, weakness/fatigue    History of Present Illness   73 y.o. male with a hx of CAD, chronic kidney disease, renal colic, recent stent placement/laser litho end of April 2020 comes in for 5 days of shaking chills, malaise and fatigue, fever up to 104.  Ureteral stent is still in place.  States he felt the worst on day 1 of illness.  Has not have vomiting or diarrhea.  No UTI s/s, no flank or abdominal pain.  No cough or shortness of breath.  No chest pain.  Other than being admitted here at the end of April, he has been home with his wife who has not been ill.  He denies sick contacts.  Is currently on Levaquin prescribed by PCP x several days.  Tried to contact the urology office for eval and they told him to come here for further evaluation to rule out coronavirus/urine infection.    Review of Systems   Review of Systems   Constitutional: Positive for chills, fever and malaise/fatigue. Negative for diaphoresis.   HENT: Negative for congestion and sore throat.    Eyes: Negative for discharge.   Respiratory: Negative for cough and shortness of breath.    Cardiovascular: Negative for chest pain and palpitations.   Gastrointestinal: Negative for abdominal pain, diarrhea, nausea and vomiting.   Genitourinary: Negative for dysuria, frequency, hematuria and urgency.        Has a ureteral stent in place   Musculoskeletal: Negative for back pain, falls and myalgias.   Skin: Negative for rash.   Neurological: Negative for dizziness, tingling, focal weakness, loss of consciousness and headaches.    Psychiatric/Behavioral: The patient is nervous/anxious.      Past Medical/Surgical History     Past Medical History:   Diagnosis Date   ??? Arthritis    ??? Benign prostate hyperplasia    ??? Bladder infection    ??? Chronic kidney disease    ??? Coronary arteriosclerosis    ??? Coronary atherosclerosis of artery bypass graft    ??? Decreased testosterone level    ??? Diverticular disease    ??? Essential hypertension    ??? Gout    ??? History of acute renal failure    ??? History of anemia    ??? History of malignant neoplasm of pancreas    ??? Hx of CABG    ??? Kidney stone    ??? Kidney stones    ??? Neuropathy    ??? Pancreatic cancer ([place])    ??? Sepsis ([place])    ??? Tremor      Past Surgical History:   Procedure Laterality Date   ??? HX ORTHOPAEDIC      back   ??? HX OTHER SURGICAL  2006    whipple procedure done for pancreatic cancer   ??? HX UROLOGICAL  2012    Percutaneous extraction of a kidney stone w/ fragmentation procedure      Social History     Social History     Socioeconomic History   ???  Marital status: MARRIED     Spouse name: Not on file   ??? Number of children: Not on file   ??? Years of education: Not on file   ??? Highest education level: Not on file   Occupational History   ??? Not on file   Social Needs   ??? Financial resource strain: Not on file   ??? Food insecurity     Worry: Not on file     Inability: Not on file   ??? Transportation needs     Medical: Not on file     Non-medical: Not on file   Tobacco Use   ??? Smoking status: Never Smoker   ??? Smokeless tobacco: Never Used   Substance and Sexual Activity   ??? Alcohol use: Not Currently   ??? Drug use: Not Currently   ??? Sexual activity: Not Currently   Lifestyle   ??? Physical activity     Days per week: Not on file     Minutes per session: Not on file   ??? Stress: Not on file   Relationships   ??? Social Product manager on phone: Not on file     Gets together: Not on file     Attends religious service: Not on file     Active member of club or organization: Not on file      Attends meetings of clubs or organizations: Not on file     Relationship status: Not on file   ??? Intimate partner violence     Fear of current or ex partner: Not on file     Emotionally abused: Not on file     Physically abused: Not on file     Forced sexual activity: Not on file   Other Topics Concern   ??? Not on file   Social History Narrative    ** Merged History Encounter **          Family History     Family History   Problem Relation Age of Onset   ??? Alcohol abuse Father    ??? Heart Disease Brother    ??? Emphysema Brother      Current Medications     Prior to Admission Medications   Prescriptions Last Dose Informant Patient Reported? Taking?   TESTOSTERONE IM   Yes No   Sig: by IntraMUSCular route.   allopurinol (ZYLOPRIM) 300 mg tablet   Yes No   Sig: Take  by mouth daily.   amLODIPine (NORVASC) 5 mg tablet   Yes No   Sig: Take 5 mg by mouth daily.   aspirin 81 mg chewable tablet   Yes No   Sig: Take 81 mg by mouth daily.   fluticasone propionate 100 mcg/actuation dsdv   Yes No   Sig: Take  by inhalation.   gabapentin (NEURONTIN) 600 mg tablet   Yes No   Sig: Take 600 mg by mouth two (2) times a day.   lipase-protease-amylase (ZENPEP) 20,000-63,000- 84,000 unit capsule   Yes No   Sig: Take 8 Caps by mouth daily.   metoprolol tartrate (LOPRESSOR) 25 mg tablet   Yes No   Sig: Take 25 mg by mouth daily.   tamsulosin (FLOMAX) 0.4 mg capsule   Yes No   Sig: Take 0.4 mg by mouth daily.   tiZANidine (ZANAFLEX) 4 mg tablet   Yes No   Sig: Take 4 mg by mouth nightly.      Facility-Administered Medications: None  Allergies     Allergies   Allergen Reactions   ??? Other Food Other (comments)     VISTAID  ANESTHESIA   ??? Codeine Itching and Swelling   ??? Oxycodone Anaphylaxis   ??? Hydroxyzine Pamoate Itching and Other (comments)     Other reaction(s): Unknown     ??? Meperidine Itching, Palpitations, Swelling and Other (comments)     Other reaction(s): Unknown     ??? Metoclopramide Itching, Other (comments) and Swelling      Other reaction(s): Other (See Comments)  Reaction unknown   Reaction unknown      ??? Morphine Other (comments) and Nausea and Vomiting   ??? Oxycodone-Acetaminophen Itching   ??? Prochlorperazine Itching, Other (comments) and Unknown (comments)     Other reaction(s): Unknown  Unsure of reaction     ??? Hydroxyzine Hcl Hives     Physical Exam     ED Triage Vitals   ED Encounter Vitals Group      BP 07/15/18 1322 133/56      Pulse (Heart Rate) 07/15/18 1322 66      Resp Rate 07/15/18 1322 16      Temp 07/15/18 1322 98.5 ??F (36.9 ??C)      Temp src --       O2 Sat (%) 07/15/18 1322 96 %      Weight 07/15/18 1320 132 lb      Height 07/15/18 1320 '5\' 8"'      Physical Exam  Constitutional:       Appearance: He is not ill-appearing, toxic-appearing or diaphoretic.      Comments: Having chills while I'm in the room   HENT:      Head: Normocephalic and atraumatic.   Eyes:      Conjunctiva/sclera: Conjunctivae normal.   Neck:      Musculoskeletal: Normal range of motion and neck supple.   Cardiovascular:      Rate and Rhythm: Normal rate and regular rhythm.   Pulmonary:      Effort: Pulmonary effort is normal.      Breath sounds: Normal breath sounds.   Abdominal:      General: Abdomen is flat.      Palpations: Abdomen is soft.      Tenderness: There is no abdominal tenderness.   Musculoskeletal: Normal range of motion.   Skin:     General: Skin is warm and dry.      Capillary Refill: Capillary refill takes less than 2 seconds.      Findings: No rash.   Neurological:      General: No focal deficit present.      Mental Status: He is alert and oriented to person, place, and time.   Psychiatric:         Mood and Affect: Mood normal.       Impression and Management Plan      Patient presenting to the emergency department complaining of shaking chills, malaise fatigue and high fever.  He had a recent urologic procedure and has an indwelling stent, concern for UTI although not having flank or abdominal pain.  Vital signs stable but with malaise fatigue and high fever consider sepsis.  Consider coronavirus thought less likely.  Will send sepsis labs, obtain urine culture.  Obtain CXR to rule out pneumonia as well as KUB to look for stent placement/stone.    Diagnostic Studies   Lab:   Recent Results (from the past 12 hour(s))   CBC WITH AUTOMATED DIFF  Collection Time: 07/15/18  2:05 PM   Result Value Ref Range    WBC 7.7 4.0 - 11.0 1000/mm3    RBC 3.16 (L) 3.80 - 5.70 M/uL    HGB 8.7 (L) 12.4 - 17.2 gm/dl    HCT 28.2 (L) 37.0 - 50.0 %    MCV 89.2 80.0 - 98.0 fL    MCH 27.5 23.0 - 34.6 pg    MCHC 30.9 30.0 - 36.0 gm/dl    PLATELET 177 140 - 450 1000/mm3    MPV 12.0 (H) 6.0 - 10.0 fL    RDW-SD 62.3 (H) 35.1 - 43.9      NRBC 0 0 - 0      IMMATURE GRANULOCYTES 0.5 0.0 - 3.0 %    NEUTROPHILS 81.8 (H) 34 - 64 %    LYMPHOCYTES 8.5 (L) 28 - 48 %    MONOCYTES 8.2 1 - 13 %    EOSINOPHILS 0.7 0 - 5 %    BASOPHILS 0.3 0 - 3 %   METABOLIC PANEL, COMPREHENSIVE    Collection Time: 07/15/18  2:05 PM   Result Value Ref Range    Sodium 137 136 - 145 mEq/L    Potassium 4.5 3.5 - 5.1 mEq/L    Chloride 108 (H) 98 - 107 mEq/L    CO2 21 21 - 32 mEq/L    Glucose 125 (H) 74 - 106 mg/dl    BUN 36 (H) 7 - 25 mg/dl    Creatinine 1.8 (H) 0.6 - 1.3 mg/dl    GFR est AA 48.0      GFR est non-AA 39      Calcium 9.0 8.5 - 10.1 mg/dl    AST (SGOT) 35 15 - 37 U/L    ALT (SGPT) 40 12 - 78 U/L    Alk. phosphatase 190 (H) 45 - 117 U/L    Bilirubin, total 0.7 0.2 - 1.0 mg/dl    Protein, total 8.0 6.4 - 8.2 gm/dl    Albumin 2.7 (L) 3.4 - 5.0 gm/dl    Anion gap 8 5 - 15 mmol/L   LACTIC ACID    Collection Time: 07/15/18  2:05 PM   Result Value Ref Range    Lactic Acid 1.9 0.4 - 2.0 mmol/L   COVID-19 (INPATIENT TESTING)     Collection Time: 07/15/18  2:05 PM   Result Value Ref Range    COVID-19 NEGATIVE NEGATIVE     URINALYSIS W/ RFLX MICROSCOPIC    Collection Time: 07/15/18  2:42 PM   Result Value Ref Range    Color YELLOW YELLOW,STRAW      Appearance CLOUDY (A) CLEAR      Glucose NEGATIVE NEGATIVE,Negative mg/dl    Bilirubin NEGATIVE NEGATIVE,Negative      Ketone NEGATIVE NEGATIVE,Negative mg/dl    Specific gravity 1.020 1.005 - 1.030      Blood LARGE (A) NEGATIVE,Negative      pH (UA) 5.5 5.0 - 9.0      Protein 30 (A) NEGATIVE,Negative mg/dl    Urobilinogen 0.2 0.0 - 1.0 mg/dl    Nitrites NEGATIVE NEGATIVE,Negative      Leukocyte Esterase LARGE (A) NEGATIVE,Negative     POC URINE MICROSCOPIC    Collection Time: 07/15/18  2:42 PM   Result Value Ref Range    WBC >50 /HPF    RBC 30-49 /HPF    Bacteria 2+ /HPF   POC URINE MACROSCOPIC    Collection Time: 07/15/18  2:55 PM   Result Value  Ref Range    Glucose Negative NEGATIVE,Negative mg/dl    Bilirubin Negative NEGATIVE,Negative      Ketone Negative NEGATIVE,Negative mg/dl    Specific gravity 1.020 1.005 - 1.030      Blood Large (A) NEGATIVE,Negative      pH (UA) 5.5 5 - 9      Protein 100 (A) NEGATIVE,Negative mg/dl    Urobilinogen 0.2 0.0 - 1.0 EU/dl    Nitrites Negative NEGATIVE,Negative      Leukocyte Esterase Moderate (A) NEGATIVE,Negative      Color Amber      Appearance Turbid       Labs Reviewed   CBC WITH AUTOMATED DIFF - Abnormal; Notable for the following components:       Result Value    RBC 3.16 (*)     HGB 8.7 (*)     HCT 28.2 (*)     MPV 12.0 (*)     RDW-SD 62.3 (*)     NEUTROPHILS 81.8 (*)     LYMPHOCYTES 8.5 (*)     All other components within normal limits   METABOLIC PANEL, COMPREHENSIVE - Abnormal; Notable for the following components:    Chloride 108 (*)     Glucose 125 (*)     BUN 36 (*)     Creatinine 1.8 (*)     Alk. phosphatase 190 (*)     Albumin 2.7 (*)     All other components within normal limits    URINALYSIS W/ RFLX MICROSCOPIC - Abnormal; Notable for the following components:    Appearance CLOUDY (*)     Blood LARGE (*)     Protein 30 (*)     Leukocyte Esterase LARGE (*)     All other components within normal limits   POC URINE MACROSCOPIC - Abnormal; Notable for the following components:    Blood Large (*)     Protein 100 (*)     Leukocyte Esterase Moderate (*)     All other components within normal limits   CULTURE, BLOOD   CULTURE, BLOOD   CULTURE, URINE   COVID-19 (INPATIENT TESTING)   LACTIC ACID   PROCALCITONIN   LACTIC ACID   POC URINE MICROSCOPIC     Imaging:    Xr Chest Sngl V    Result Date: 07/15/2018  AP chest. Comparison 05/19/2018 INDICATION: Sepsis.     IMPRESSION: Midline sternotomy. Cardiac silhouette normal. Lungs clear of any acute process.     Xr Abd (kub)    Result Date: 07/15/2018  Supine abdomen. INDICATION: Fever. FINDINGS: Left-sided double-J catheter in place. Multiple calcifications overlying the left kidney. Scattered arthritis lumbar spine. Previous lower lumbar spine surgery with pedicle screws L4 and L5 level. Penile prostheses identified. Bowel gas pattern unremarkable.     IMPRESSION: Findings as described above     ED Course     Patient was maintained on cardiac, oxygen, and blood pressure monitoring without arrhythmia or event.    Patient Vitals for the past 12 hrs:   Temp Pulse Resp BP SpO2   07/15/18 1322 98.5 ??F (36.9 ??C) 66 16 133/56 96 %       ED Course as of Jul 15 1555   Wed Jul 15, 2018   1420 Discussed the patient's history, physical and plan with Dr. Emeterio Reeve who is in agreement with orders as placed.      [EI]   1441 WBC: 7.7 [EI]   1441 Chronic, not acutely changed   HGBMarland Kitchen):  8.7 [EI]   1452 Awaiting microscopic   Leukocyte Esterase(!): Moderate [EI]   1452 Blood(!): Large [EI]   1501 Creatinine(!): 1.8 [EI]   1501 BUN(!): 36 [EI]   1501 Potassium: 4.5 [EI]   1513 WBC: >50 [EI]   1513 Cultured already- ordering antibiotics   RBC: 30-49 [EI]    1513 Bacteria: 2+ [EI]   1513 IMPRESSION: Midline sternotomy. Cardiac silhouette normal. Lungs clear of any  acute process.   XR CHEST SNGL V [EI]   0254 FINDINGS: Left-sided double-J catheter in place. Multiple calcifications  overlying the left kidney.    XR ABD (KUB) [EI]   1521 Reviewed patient's urine culture from April.  30,000 E. coli resistant to Cipro and Levaquin otherwise susceptible.  Discussed test results with Dr. Emeterio Reeve- Rocephin IV ordered.    [EI]   1521 Lactic Acid: 1.9 [EI]      ED Course User Index  [EI] Jenelle Mages, PA-C     Dr. Emeterio Reeve discussed with Dr. Tonye Royalty who agrees to admission.  Urology paged for consult @ time of admission.    Medications   cefTRIAXone (ROCEPHIN) 2 g in 0.9% sodium chloride (MBP/ADV) 50 mL MBP (2 g IntraVENous New Bag 07/15/18 1548)   sodium chloride 0.9 % bolus infusion 1,797 mL (0 mL/kg ?? 59.9 kg IntraVENous IV Completed 07/15/18 1552)       Medical Decision Making   Patient presenting with fever, chills, malaise and fatigue, some urinary frequency.  He has an indwelling urinary stent which appears to be in good placement on KUB.  He has no leukocytosis but does have evidence of UTI on microscopic urinalysis, culture pending.  Lactic acid is reassuringly normal.  Patient has been given Rocephin, will be brought into the hospital for further treatment and urology consult.  VSS while in the ED on monitor.      Final Diagnosis       ICD-10-CM ICD-9-CM   1. Complicated UTI (urinary tract infection) N39.0 599.0   2. Fever and chills R50.9 780.60   3. Malaise and fatigue R53.81 780.79    R53.83    4. Chronic renal impairment, unspecified CKD stage N18.9 585.9     Disposition   Hospital admission to telemetry    The patient was personally evaluated by myself and [facility], Jeanine Luz, MD who agrees with the above assessment and plan.    Ahman Dugdale E. Gustavus Messing  Jul 15, 2018    My signature above authenticates this document and my orders, the final ??   diagnosis (es), discharge prescription (s), and instructions in the Epic ??  record.  If you have any questions please contact [phone].  ??  Nursing notes have been reviewed by the physician/ advanced practice ??  Clinician.    Dragon medical dictation software was used for portions of this report. Unintended voice recognition errors may occur.

## 2018-07-15 NOTE — Consults (Addendum)
1. Complicated UTI (urinary tract infection)    2. Fever and chills    3. Malaise and fatigue    4. Chronic renal impairment, unspecified CKD stage        ASSESSMENT:   - Complicated UTI ??- failed outpatient treatment of Levoquin   - Left 22mm proximal ureteral stone with hydroureteronephrosis and 27mm LLP stone  ????????????????????????- s/p cystoscopy, left JJ stent on 06/22/2018 by Dr. Claiborne Billings   - AKI with baseline creatinine 1.5  ????????????????????????- Peak creatinine 1.8 (5/20)              - Current creatinine 1.8   - Fevers (up to 104.5) and rigors at home - temperature upon admission 98.5   - WBC upon admission 7.7      - Hx of nephrolithiasis s/p URS with LL in 02/2017  - Hx of ED with IPP  - Hx of Pancreatic cancer s/p Whipple in 2006, chemotherapy and radiation, chronic anemia       PLAN:    - KUB shows stent in position, will obtain renal ultrasound to assess for hydronephrosis   - Urine culture pending - antibiotics as per primary team - currently on Rocephin   - Will monitor PVRs   - NPO until after renal ultrasound - if no hydronephrosis he can eat. If there is hydronephrosis, will need non-contrast CT of abd/pelvis and possible left JJ stent exchange tonight vs in AM. Please call on call urology when these results are finalized   - Message sent to [facility] to tentatively add on for Tuesday 5/26 for cystoscopy, ureteroscopy with laser lithotripsy, left JJ stent exchange vs removal as patient would like definitive stone treatment and stent removal during this visit. This can only be done if negative urine culture and patient stable.   - Will continue to follow     Follow up arranged? NO - will need follow up with Dr. Oleta Mouse .      [place], PA-C    [phone]        Chief Complaint   Patient presents with   ??? Covid Testing   ??? Abnormal Lab Results        HISTORY OF PRESENT ILLNESS:  Tommy Brown is a 73 y.o. male who is seen in consultation as referred by Bosie Helper for complicated UTI. Patient had  Left 65mm proximal ureteral stone with hydroureteronephrosis and 26mm LLP stone s/p cystoscopy, left JJ stent on 06/22/2018 by Dr. Claiborne Billings. He has not had surgery yet for these stones. With Ecoli UTI He is followed by Dr. Oleta Mouse.     He presented to [facility] for fevers up to 104.5, shaking chills,  And fatigue x 5 days. States he felt the worst on day 1 of illness.  Has not have vomiting or diarrhea.  Denies dysuria, gross hematuria, urgency, frequency, flank, and  abdominal pain.  No cough or shortness of breath.           AUA Symptom Score 07/08/2018   Over the past month how often have you had the sensation that your bladder was not completely empty after you finished urinating? 0   Over the past month, how often have had to urinate again less than 2 hours after you last finished urinating? 0   Over the past month, how often have you found you stopped and started again several times when you urinated? 0   Over the past month, how often have you  found it difficult to postpone urination? 0   Over the past month, how often have you had a weak urinary stream? 0   Over the past month, how often have you had to push or strain to begin urinating? 0   Over the past month, how many times did you most typically get up to urinate from the time you went to bed at night until the time you got up in the morning? 0   AUA Score 0   If you were to spend the rest of your life with your urinary condition the way it is now, how would you feel about that? Mostly satisfied         Past Medical History:   Diagnosis Date   ??? Arthritis    ??? Benign prostate hyperplasia    ??? Bladder infection    ??? Chronic kidney disease    ??? Coronary arteriosclerosis    ??? Coronary atherosclerosis of artery bypass graft    ??? Decreased testosterone level    ??? Diverticular disease    ??? Essential hypertension     ??? Gout    ??? History of acute renal failure    ??? History of anemia    ??? History of malignant neoplasm of pancreas    ??? Hx of CABG    ??? Kidney stone    ??? Kidney stones    ??? Neuropathy    ??? Pancreatic cancer ([place])    ??? Sepsis ([place])    ??? Tremor        Past Surgical History:   Procedure Laterality Date   ??? HX ORTHOPAEDIC      back   ??? HX OTHER SURGICAL  2006    whipple procedure done for pancreatic cancer   ??? HX UROLOGICAL  2012    Percutaneous extraction of a kidney stone w/ fragmentation procedure        Social History     Tobacco Use   ??? Smoking status: Never Smoker   ??? Smokeless tobacco: Never Used   Substance Use Topics   ??? Alcohol use: Not Currently   ??? Drug use: Not Currently       Allergies   Allergen Reactions   ??? Other Food Other (comments)     VISTAID  ANESTHESIA   ??? Codeine Itching and Swelling   ??? Oxycodone Anaphylaxis   ??? Hydroxyzine Pamoate Itching and Other (comments)     Other reaction(s): Unknown     ??? Meperidine Itching, Palpitations, Swelling and Other (comments)     Other reaction(s): Unknown     ??? Metoclopramide Itching, Other (comments) and Swelling     Other reaction(s): Other (See Comments)  Reaction unknown   Reaction unknown      ??? Morphine Other (comments) and Nausea and Vomiting   ??? Oxycodone-Acetaminophen Itching   ??? Prochlorperazine Itching, Other (comments) and Unknown (comments)     Other reaction(s): Unknown  Unsure of reaction     ??? Hydroxyzine Hcl Hives       Family History   Problem Relation Age of Onset   ??? Alcohol abuse Father    ??? Heart Disease Brother    ??? Emphysema Brother        Current Facility-Administered Medications   Medication Dose Route Frequency Provider Last Rate Last Dose   ??? cefTRIAXone (ROCEPHIN) 2 g in 0.9% sodium chloride (MBP/ADV) 50 mL MBP  2 g IntraVENous NOW Icenbice, Erin E, PA-C 100 mL/hr at 07/15/18 1548  2 g at 07/15/18 1548   ??? dextrose 5 % - 0.45% NaCl infusion  50 mL/hr IntraVENous CONTINUOUS Hutchinson, Anne, DO         Current Outpatient Medications    Medication Sig Dispense Refill   ??? metoprolol tartrate (LOPRESSOR) 25 mg tablet Take 25 mg by mouth daily.     ??? gabapentin (NEURONTIN) 600 mg tablet Take 600 mg by mouth two (2) times a day.     ??? amLODIPine (NORVASC) 5 mg tablet Take 5 mg by mouth daily.     ??? allopurinol (ZYLOPRIM) 300 mg tablet Take  by mouth daily.     ??? tamsulosin (FLOMAX) 0.4 mg capsule Take 0.4 mg by mouth daily.     ??? lipase-protease-amylase (ZENPEP) 20,000-63,000- 84,000 unit capsule Take 8 Caps by mouth daily.     ??? aspirin 81 mg chewable tablet Take 81 mg by mouth daily.     ??? TESTOSTERONE IM by IntraMUSCular route.         Review of Systems  ROS is:    Negative for: Ophthalmologic issues, ENT issues, Cardiovascular issues, respiratory issues, GI issues, neurologic issues, hematoogic issues, skin lesions, musculoskeletal issues, psychiatric issues  Exceptions: yes    Positive for:    Fever, chills                PHYSICAL EXAMINATION:   Visit Vitals  BP 133/56   Pulse 66   Temp 98.5 ??F (36.9 ??C)   Resp 16   Ht 5\' 8"  (1.727 m)   Wt 59.9 kg (132 lb)   SpO2 96%   BMI 20.07 kg/m??     Constitutional: Well developed, well nourished male.  No acute distress.    HEENT: Normocephalic, Atraumatic, EOM's intact   CV:  no edema  Respiratory: No respiratory distress or difficulties breathing   Abdomen:  soft and non tender   GU Male:  Mild Left CVA tenderness  EXB:[id number]   Skin: No evidence of jaundice.  Normal color  Neuro/Psych:  Alert and oriented. Affect appropriate.   MSK: normal ROM       REVIEW OF LABS AND IMAGING:     KUB 07/15/2018  FINDINGS: Left-sided double-J catheter in place. Multiple calcifications  overlying the left kidney. Scattered arthritis lumbar spine. Previous lower  lumbar spine surgery with pedicle screws L4 and L5 level. Penile prostheses  identified. Bowel gas pattern unremarkable.  ??  IMPRESSION  IMPRESSION: Findings as described above     Labs: Results:   Chemistry    Recent Labs     07/15/18  1405   GLU 125*   NA 137    K 4.5   CL 108*   CO2 21   BUN 36*   CREA 1.8*   CA 9.0   AGAP 8   AP 190*   TP 8.0   ALB 2.7*      CBC w/Diff Recent Labs     07/15/18  1405   WBC 7.7   RBC 3.16*   HGB 8.7*   HCT 28.2*   PLT 177   GRANS 81.8*   LYMPH 8.5*   EOS 0.7      Cultures No results for input(s): CULT in the last 72 hours.  All Micro Results     Procedure Component Value Units Date/Time    COVID-19 (INPATIENT TESTING) [[id number]] Collected:  07/15/18 1405    Order Status:  Completed Specimen:  NASOPHARYNGEAL SWAB Updated:  07/15/18 1537  COVID-19 NEGATIVE        Comment: Negative results do not preclude SARS-CoV-2 infection and should not be used as the sole basis  for treatment or other patient management decisions. Negative results must be combined with  clinical observations, patient history, and epidemiological information.  Testing with the Xpert Xpress SARS-CoV-2 test is intended for use by trained operators who are  proficient in performing tests using either GeneXpert Dx, GeneXpert Infinity and/or [employer]. The Xpert Xpress SARS-CoV-2 test is only for use under the Food and Drug  Administration's Emergency Use Authorization.         CULTURE, URINE [[id number]] Collected:  07/15/18 1442    Order Status:  Completed Specimen:  Urine Updated:  07/15/18 1521    CULTURE, BLOOD [[id number]] Collected:  07/15/18 1442    Order Status:  Completed Specimen:  Blood Updated:  07/15/18 1500    CULTURE, BLOOD [[id number]] Collected:  07/15/18 1405    Order Status:  Completed Specimen:  Blood Updated:  07/15/18 1433            Urinalysis Color   Date Value Ref Range Status   07/15/2018 Amber   Final     Appearance   Date Value Ref Range Status   07/15/2018 Turbid   Final     Specific gravity   Date Value Ref Range Status   07/15/2018 1.020 1.005 - 1.030   Final     pH (UA)   Date Value Ref Range Status   07/15/2018 5.5 5 - 9   Final     Protein   Date Value Ref Range Status   07/15/2018 100 (A) NEGATIVE,Negative mg/dl Final      Ketone   Date Value Ref Range Status   07/15/2018 Negative NEGATIVE,Negative mg/dl Final     Bilirubin   Date Value Ref Range Status   07/15/2018 Negative NEGATIVE,Negative   Final     Blood   Date Value Ref Range Status   07/15/2018 Large (A) NEGATIVE,Negative   Final     Comment:     Operator: [id number]     Urobilinogen   Date Value Ref Range Status   07/15/2018 0.2 0.0 - 1.0 EU/dl Final     Nitrites   Date Value Ref Range Status   07/15/2018 Negative NEGATIVE,Negative   Final     Leukocyte Esterase   Date Value Ref Range Status   07/15/2018 Moderate (A) NEGATIVE,Negative   Final     Potassium   Date Value Ref Range Status   07/15/2018 4.5 3.5 - 5.1 mEq/L Final     Creatinine   Date Value Ref Range Status   07/15/2018 1.8 (H) 0.6 - 1.3 mg/dl Final     BUN   Date Value Ref Range Status   07/15/2018 36 (H) 7 - 25 mg/dl Final     Prostate Specific Ag   Date Value Ref Range Status   04/30/2018 0.6 0.0 - 4.0 ng/mL Final      PSA No results for input(s): PSA in the last 72 hours.   Coagulation No results found for: PTP, INR, APTT, INREXT

## 2018-07-15 NOTE — Telephone Encounter (Signed)
Spoke with pt, informed him per Dr. Oleta Mouse that he needs to go to the ER to ensure that his fevers are not only UTI related. Dr. Oleta Mouse also reccomends that he be tested for COVID-19. I explained to the patient that he needs to ensure the ER does a urine culture due to him not being able to come in office with his symptoms. Pt expressed understanding, thanked me for the call, and ensured me that he would go to the ER today.    Renaee Munda  (CJ)

## 2018-07-15 NOTE — Progress Notes (Signed)
RRT called to Ultrasound for patient "shaking". On arrival patient is talking and oriented in no acute distress but is shaking.  Patient is being seen for UTI and is in U/S for evaluation on his renal stents.  Pt is hot to the touch.  Returned to the ED where temperature was found to be 102.9.  ED nurse made aware.

## 2018-07-15 NOTE — Progress Notes (Signed)
Dr. Tonye Royalty notified of temp 104.4, will order now dose of IV tylenol. Will continue to monitor.

## 2018-07-15 NOTE — Progress Notes (Signed)
Renal and bladder ultrasound reviewed; mild left hydronephrosis may just be intrarenal reflux from stent, post void bladder ultrasound did show elevated residual of 400 mL.  Ok for diet, would recommend Foley catheter placement, prior culture grew 30k E. Coli sensitive to ceftriaxone which he is receiving, continue on antibiotics.

## 2018-07-15 NOTE — Progress Notes (Signed)
Code R Consult Received.    After Hours Consult:      N    Last admission dc date and dx         5/1 AKI/SIRS    What was the discharge plan     HOME    Now presents        UTI    Case Discussed with ED Physician:   N    Case Discussed with Attending Physician:    N    Appropriate for Admission:      Yes INPT failed outpt tx    Anticipated Discharge Needs:      tbd    Disposition if discharged from the ED          Readmission interview done (epic)     OTF     Readmission assessment completed (compare AD)   Benjaman Lobe complete Y

## 2018-07-15 NOTE — Telephone Encounter (Signed)
I called pt this afternoon to get pt scheduled to have his surgery done with Dr. Oleta Mouse. Pt informed me that he is currently in the hospital, pt told me to give him a phone call back next week.

## 2018-07-15 NOTE — Progress Notes (Addendum)
Pt voided 400 mL  Bladder scan 129 mL

## 2018-07-15 NOTE — ED Notes (Signed)
TRANSFER - OUT REPORT:    Verbal report given to Merry Lofty, RN(name) on Tommy Brown  being transferred to  [address](unit) for routine progression of care       Report consisted of patient???s Situation, Background, Assessment and   Recommendations(SBAR).     Information from the following report(s) SBAR was reviewed with the receiving nurse.    Lines:   Peripheral IV 07/15/18 Right Antecubital (Active)   Site Assessment Clean, dry, & intact 07/15/2018  2:37 PM   Phlebitis Assessment 0 07/15/2018  2:37 PM   Dressing Status Clean, dry, & intact 07/15/2018  2:37 PM   Dressing Type Transparent 07/15/2018  2:37 PM   Hub Color/Line Status Pink 07/15/2018  2:37 PM        Opportunity for questions and clarification was provided.      Patient transported with:   [employer]

## 2018-07-15 NOTE — H&P (Signed)
Admission History and Physical  Tommy Kind D.O.     Patient: Tommy Brown Age: 73 y.o. Sex: male    Date of Birth: 05/13/1945 Admit Date: 07/15/2018 Admit Doctor: No admitting provider for patient encounter.   MRN: [id number]  CSN: [id number]  PCP: Daralene Milch, MD       Assessment / Plan     Complicated urinary tract infection  Ureteral stent status post lithotripsy April 2020  Coronary artery disease status post CABG  Chronic kidney disease stage III  Hypertension  Gout      Plan:  -Appreciate urology recommendations  -Continue ceftriaxone which prior urine culture grew E. coli April 27 which was sensitive to ceftriaxone, follow repeat culture  -Support with IV fluids  -Antipyretics as needed  -Follow retroperitoneal ultrasound, if significant hydronephrosis noted may need CT and to maintain n.p.o. status, if not can allow patient to eat  -Continue home regimen for hypertension including amlodipine, metoprolol  -Continue home Flomax  -Continue aspirin  -Continue gabapentin  -Continue home allopurinol    Diet: N.p.o. for now pending urology permission, then cardiac  DVT ppx: Holding for now, SCDs only should patient require intervention      DISPO  -Pt to be admitted  at this time for reasons addressed above, continued hospitalization for ongoing assessment and treatment indicated     Anticipated Date of Discharge: 07/17/2018  Anticipated Disposition (home, SNF) : Home    Code Status: Full code    MPOA: Wife Tommy Brown [phone]      Chief Complaint:   Chief Complaint   Patient presents with   ??? Covid Testing   ??? Abnormal Lab Results         HPI:    Tommy Brown is a 73 y.o. year old male past medical history of coronary artery disease, hypertension, chronic kidney disease stage III, gout who presents from home today with concern of ongoing fever, chills, Reiger's, feeling unwell with poor appetite.  He states he was started to be treated for a UTI by his primary care physician and has been on Levaquin.  Last urine culture from April 27 grew E. coli which was resistant to Levaquin.  Patient does report dark urine, mild flank discomfort.  No nausea or vomiting but has not really eaten or drank much over the last few days.    Patient had a ureteral stent placed April 2020 following laser and lithotripsy for stone.    ER course: CBC reveals white blood cell count 7.7 hemoglobin 8.7 platelets 177.  Metabolic work-up shows creatinine of 1.8 insistent with his baseline chronic kidney disease.  Urinalysis is 2+ bacteria large leukocyte greater than 50 white blood cells.  Chest x-ray unremarkable.  Coronavirus swab was negative.  Patient given 2 g of ceftriaxone.  Urology was consulted recommends retroperitoneal ultrasound to evaluate for hydronephrosis.    Review of Systems -   Review of Systems   Constitutional: Positive for chills, fever and malaise/fatigue.   HENT: Negative for congestion, ear pain, sinus pain and sore throat.    Eyes: Negative for blurred vision and photophobia.   Respiratory: Negative for cough, shortness of breath and wheezing.    Cardiovascular: Negative for chest pain, palpitations and leg swelling.   Gastrointestinal: Negative for abdominal pain, constipation, diarrhea, heartburn, nausea and vomiting.   Genitourinary: Positive for urgency. Negative for dysuria and flank pain.   Musculoskeletal: Negative for myalgias.   Skin: Negative for itching and rash.   Neurological:  Negative for dizziness, weakness and headaches.   Endo/Heme/Allergies: Negative for polydipsia.    Psychiatric/Behavioral: Negative for depression. The patient is not nervous/anxious.          Past Medical History:  Past Medical History:   Diagnosis Date   ??? Arthritis    ??? Benign prostate hyperplasia    ??? Bladder infection    ??? Chronic kidney disease    ??? Coronary arteriosclerosis    ??? Coronary atherosclerosis of artery bypass graft    ??? Decreased testosterone level    ??? Diverticular disease    ??? Essential hypertension    ??? Gout    ??? History of acute renal failure    ??? History of anemia    ??? History of malignant neoplasm of pancreas    ??? Hx of CABG    ??? Kidney stone    ??? Kidney stones    ??? Neuropathy    ??? Pancreatic cancer ([place])    ??? Sepsis ([place])    ??? Tremor        Past Surgical History:  Past Surgical History:   Procedure Laterality Date   ??? HX ORTHOPAEDIC      back   ??? HX OTHER SURGICAL  2006    whipple procedure done for pancreatic cancer   ??? HX UROLOGICAL  2012    Percutaneous extraction of a kidney stone w/ fragmentation procedure        Family History:  Family History   Problem Relation Age of Onset   ??? Alcohol abuse Father    ??? Heart Disease Brother    ??? Emphysema Brother        Social History:  Social History     Socioeconomic History   ??? Marital status: MARRIED     Spouse name: Not on file   ??? Number of children: Not on file   ??? Years of education: Not on file   ??? Highest education level: Not on file   Tobacco Use   ??? Smoking status: Never Smoker   ??? Smokeless tobacco: Never Used   Substance and Sexual Activity   ??? Alcohol use: Not Currently   ??? Drug use: Not Currently   ??? Sexual activity: Not Currently   Social History Narrative    ** Merged History Encounter **            Home Medications:  Prior to Admission medications    Medication Sig Start Date End Date Taking? Authorizing Provider   metoprolol tartrate (LOPRESSOR) 25 mg tablet Take 25 mg by mouth daily.    Other, Phys, MD   gabapentin (NEURONTIN) 600 mg tablet Take 600 mg by mouth two (2) times a day.    Other, Phys, MD    amLODIPine (NORVASC) 5 mg tablet Take 5 mg by mouth daily.    Other, Phys, MD   allopurinol (ZYLOPRIM) 300 mg tablet Take  by mouth daily.    Other, Phys, MD   tamsulosin (FLOMAX) 0.4 mg capsule Take 0.4 mg by mouth daily.    Other, Phys, MD   lipase-protease-amylase (ZENPEP) 20,000-63,000- 84,000 unit capsule Take 8 Caps by mouth daily.    Other, Phys, MD   tiZANidine (ZANAFLEX) 4 mg tablet Take 4 mg by mouth nightly.    Other, Phys, MD   fluticasone propionate 100 mcg/actuation dsdv Take  by inhalation.    Other, Phys, MD   aspirin 81 mg chewable tablet Take 81 mg by mouth daily.  Other, Phys, MD   TESTOSTERONE IM by IntraMUSCular route.    Other, Phys, MD       Allergies:  Allergies   Allergen Reactions   ??? Other Food Other (comments)     VISTAID  ANESTHESIA   ??? Codeine Itching and Swelling   ??? Oxycodone Anaphylaxis   ??? Hydroxyzine Pamoate Itching and Other (comments)     Other reaction(s): Unknown     ??? Meperidine Itching, Palpitations, Swelling and Other (comments)     Other reaction(s): Unknown     ??? Metoclopramide Itching, Other (comments) and Swelling     Other reaction(s): Other (See Comments)  Reaction unknown   Reaction unknown      ??? Morphine Other (comments) and Nausea and Vomiting   ??? Oxycodone-Acetaminophen Itching   ??? Prochlorperazine Itching, Other (comments) and Unknown (comments)     Other reaction(s): Unknown  Unsure of reaction     ??? Hydroxyzine Hcl Hives         Physical Exam:     Visit Vitals  BP 133/56   Pulse 66   Temp 98.5 ??F (36.9 ??C)   Resp 16   Ht '5\' 8"'  (1.727 m)   Wt 59.9 kg (132 lb)   SpO2 96%   BMI 20.07 kg/m??     Physical Exam  Constitutional:       General: He is not in acute distress.     Appearance: He is not diaphoretic.   HENT:      Head: Normocephalic and atraumatic.      Right Ear: External ear normal.      Left Ear: External ear normal.      Nose: Nose normal.      Mouth/Throat:      Dentition: Normal dentition.   Eyes:      General:         Right eye: No discharge.          Left eye: No discharge.      Conjunctiva/sclera: Conjunctivae normal.      Pupils: Pupils are equal, round, and reactive to light.   Neck:      Musculoskeletal: Neck supple.      Thyroid: No thyromegaly.      Vascular: No JVD.   Cardiovascular:      Rate and Rhythm: Normal rate and regular rhythm.      Heart sounds: Normal heart sounds. No murmur. No friction rub.   Pulmonary:      Effort: Pulmonary effort is normal. No respiratory distress.      Breath sounds: Normal breath sounds. No wheezing.   Abdominal:      General: Bowel sounds are normal. There is no distension.      Palpations: Abdomen is soft.      Tenderness: There is no abdominal tenderness. There is no guarding or rebound.   Musculoskeletal: Normal range of motion.   Skin:     General: Skin is warm and dry.   Neurological:      Mental Status: He is alert and oriented to person, place, and time.      Cranial Nerves: No cranial nerve deficit.      Coordination: Coordination normal.      Deep Tendon Reflexes: Reflexes are normal and symmetric.   Psychiatric:         Mood and Affect: Mood and affect normal.         Cognition and Memory: Memory normal.  Judgment: Judgment normal.         Intake and Output:  Current Shift:  No intake/output data recorded.  Last three shifts:  No intake/output data recorded.    Lab/Data Reviewed:  Recent Results (from the past 24 hour(s))   CBC WITH AUTOMATED DIFF    Collection Time: 07/15/18  2:05 PM   Result Value Ref Range    WBC 7.7 4.0 - 11.0 1000/mm3    RBC 3.16 (L) 3.80 - 5.70 M/uL    HGB 8.7 (L) 12.4 - 17.2 gm/dl    HCT 28.2 (L) 37.0 - 50.0 %    MCV 89.2 80.0 - 98.0 fL    MCH 27.5 23.0 - 34.6 pg    MCHC 30.9 30.0 - 36.0 gm/dl    PLATELET 177 140 - 450 1000/mm3    MPV 12.0 (H) 6.0 - 10.0 fL    RDW-SD 62.3 (H) 35.1 - 43.9      NRBC 0 0 - 0      IMMATURE GRANULOCYTES 0.5 0.0 - 3.0 %    NEUTROPHILS 81.8 (H) 34 - 64 %    LYMPHOCYTES 8.5 (L) 28 - 48 %    MONOCYTES 8.2 1 - 13 %    EOSINOPHILS 0.7 0 - 5 %     BASOPHILS 0.3 0 - 3 %   METABOLIC PANEL, COMPREHENSIVE    Collection Time: 07/15/18  2:05 PM   Result Value Ref Range    Sodium 137 136 - 145 mEq/L    Potassium 4.5 3.5 - 5.1 mEq/L    Chloride 108 (H) 98 - 107 mEq/L    CO2 21 21 - 32 mEq/L    Glucose 125 (H) 74 - 106 mg/dl    BUN 36 (H) 7 - 25 mg/dl    Creatinine 1.8 (H) 0.6 - 1.3 mg/dl    GFR est AA 48.0      GFR est non-AA 39      Calcium 9.0 8.5 - 10.1 mg/dl    AST (SGOT) 35 15 - 37 U/L    ALT (SGPT) 40 12 - 78 U/L    Alk. phosphatase 190 (H) 45 - 117 U/L    Bilirubin, total 0.7 0.2 - 1.0 mg/dl    Protein, total 8.0 6.4 - 8.2 gm/dl    Albumin 2.7 (L) 3.4 - 5.0 gm/dl    Anion gap 8 5 - 15 mmol/L   LACTIC ACID    Collection Time: 07/15/18  2:05 PM   Result Value Ref Range    Lactic Acid 1.9 0.4 - 2.0 mmol/L   URINALYSIS W/ RFLX MICROSCOPIC    Collection Time: 07/15/18  2:42 PM   Result Value Ref Range    Color YELLOW YELLOW,STRAW      Appearance CLOUDY (A) CLEAR      Glucose NEGATIVE NEGATIVE,Negative mg/dl    Bilirubin NEGATIVE NEGATIVE,Negative      Ketone NEGATIVE NEGATIVE,Negative mg/dl    Specific gravity 1.020 1.005 - 1.030      Blood LARGE (A) NEGATIVE,Negative      pH (UA) 5.5 5.0 - 9.0      Protein 30 (A) NEGATIVE,Negative mg/dl    Urobilinogen 0.2 0.0 - 1.0 mg/dl    Nitrites NEGATIVE NEGATIVE,Negative      Leukocyte Esterase LARGE (A) NEGATIVE,Negative     POC URINE MICROSCOPIC    Collection Time: 07/15/18  2:42 PM   Result Value Ref Range    WBC >50 /HPF    RBC 30-49 /  HPF    Bacteria 2+ /HPF   POC URINE MACROSCOPIC    Collection Time: 07/15/18  2:55 PM   Result Value Ref Range    Glucose Negative NEGATIVE,Negative mg/dl    Bilirubin Negative NEGATIVE,Negative      Ketone Negative NEGATIVE,Negative mg/dl    Specific gravity 1.020 1.005 - 1.030      Blood Large (A) NEGATIVE,Negative      pH (UA) 5.5 5 - 9      Protein 100 (A) NEGATIVE,Negative mg/dl    Urobilinogen 0.2 0.0 - 1.0 EU/dl    Nitrites Negative NEGATIVE,Negative       Leukocyte Esterase Moderate (A) NEGATIVE,Negative      Color Amber      Appearance Turbid         [place] Results (most recent):  Results from Hospital Encounter encounter on 07/15/18   [place] RETROPERITONEUM COMP    Narrative Examination: Ultrasound retroperitoneum complete    INDICATION: Renal ultrasound for stent.    FINDINGS:    Exam limited due to patient body habitus and motion. RRT called on patient.    The right kidney roughly measures 10.5 x 7.0 x 4.3 cm there are multiple cysts  are identified within the right kidney, measuring up to 1.9 cm within the  midpole. No hydronephrosis. Renal parenchyma appears mildly echogenic. No focal  mass.    The left kidney roughly measures 4.4 x 8.4 x 6.2 cm. Multiple cysts are  identified within the left kidney, measuring up to 2.4 x 2.9 cm within the  midpole. There is mild hydronephrosis with the AP dimension of the pelvis  measuring 2.1 cm. Renal parenchyma slightly echogenic. No mass.    Prevoid urinary bladder volume measures 568 mL. Post void volume measures 408  mL. Stent is identified within the urinary bladder and the left kidney.    Prostate volume measures 31.18 mL per      Impression IMPRESSION:  1. Both kidneys are slightly echogenic, concerning for medical renal disease.  2. Mild left-sided hydronephrosis with a stent which appears in customary  position.  3. Small bilateral renal cysts.         Tommy Kind, DO    Jul 15, 2018  3:36 PM    Dragon medical dictation software was used for portions of this report.  Unintended voice transcription errors may have occurred.

## 2018-07-15 NOTE — Progress Notes (Signed)
Pt voided 400 mL  Bladder scan 129 mL

## 2018-07-15 NOTE — ED Notes (Signed)
Pt arrived to ER as a referral from [facility] for a urine culture and COVID screening. Pt denies any fevers today, but has had a fever in the past weeks. Pt denies any coughs.

## 2018-07-15 NOTE — Progress Notes (Signed)
 Pt educated on having to insert foley catheter. Pt refusing insertion due to pain. Pt stated I will not get it inserted unless I receive lidocaine . Pt educated that if foley is not inserted post void checks will be continued. Pt agreed. Charge nurse informed of situation.

## 2018-07-15 NOTE — ED Notes (Signed)
 TRANSFER - OUT REPORT:    Verbal report given to Kania, RN(name) on Tommy Brown  being transferred to  [facility](unit) for routine progression of care       Report consisted of patient's Situation, Background, Assessment and   Recommendations(SBAR).     Information from the following report(s) SBAR was reviewed with the receiving nurse.    Lines:   Peripheral IV 07/15/18 Right Antecubital (Active)   Site Assessment Clean, dry, & intact 07/15/2018  2:37 PM   Phlebitis Assessment 0 07/15/2018  2:37 PM   Dressing Status Clean, dry, & intact 07/15/2018  2:37 PM   Dressing Type Transparent 07/15/2018  2:37 PM   Hub Color/Line Status Pink 07/15/2018  2:37 PM        Opportunity for questions and clarification was provided.      Patient transported with:   [employer]

## 2018-07-15 NOTE — Progress Notes (Signed)
Code R Consult Received.    After Hours Consult:      N    Last admission dc date and dx         5/1 AKI/SIRS    What was the discharge plan     HOME    Now presents        UTI    Case Discussed with ED Physician:   N    Case Discussed with Attending Physician:    N    Appropriate for Admission:      Yes INPT failed outpt tx    Anticipated Discharge Needs:      tbd    Disposition if discharged from the ED          Readmission interview done (epic)     OTF     Readmission assessment completed (compare AD)   Mikael Spray complete Y

## 2018-07-15 NOTE — H&P (Signed)
H&P by Jonnie Kind, DO at 07/15/18 1536                Author: Jonnie Kind, DO  Service: Hospitalist  Author Type: Physician       Filed: 07/15/18 2054  Date of Service: 07/15/18 1536  Status: Signed          Editor: Jonnie Kind, DO (Physician)                          Admission History and Physical   [name]           Patient: Tommy Brown  Age: 73 y.o.  Sex: male          Date of Birth: 1945/05/28  Admit Date: 07/15/2018  Admit Doctor: No admitting provider for patient encounter.         MRN: [id number]   CSN: [id number]   PCP: Daralene Milch, MD             Assessment / Plan        Complicated urinary tract infection   Ureteral stent status post lithotripsy April 2020   Coronary artery disease status post CABG   Chronic kidney disease stage III   Hypertension   Gout         Plan:   -Appreciate urology recommendations   -Continue ceftriaxone which prior urine culture grew E. coli April 27 which was sensitive to ceftriaxone, follow repeat culture   -Support with IV fluids   -Antipyretics as needed   -Follow retroperitoneal ultrasound, if significant hydronephrosis noted may need CT and to maintain n.p.o. status, if not can allow patient to eat   -Continue home regimen for hypertension including amlodipine, metoprolol   -Continue home Flomax   -Continue aspirin   -Continue gabapentin   -Continue home allopurinol      Diet: N.p.o. for now pending urology permission, then cardiac   DVT ppx: Holding for now, SCDs only should patient require intervention         DISPO   -Pt to be admitted  at this time for reasons addressed above, continued hospitalization for ongoing assessment and treatment indicated       Anticipated Date of Discharge: 07/17/2018   Anticipated Disposition (home, SNF) : Home      Code Status: Full code      MPOA: Wife [name] [phone]         Chief Complaint:      Chief Complaint       Patient presents with        ?  Covid Testing        ?  Abnormal  Lab Results                HPI:     Tommy Brown is a 72 y.o.  year old male past medical history of coronary artery disease, hypertension, chronic kidney disease stage III, gout who presents from home today  with concern of ongoing fever, chills, Reiger's, feeling unwell with poor appetite.  He states he was started to be treated for a UTI by his primary care physician and has been on Levaquin.  Last urine culture from April 27 grew E. coli which was resistant  to Levaquin.  Patient does report dark urine, mild flank discomfort.  No nausea or vomiting but has not really eaten or drank much over the last few days.  Patient had a ureteral stent placed April 2020 following laser and lithotripsy for stone.      ER course: CBC reveals white blood cell count 7.7 hemoglobin 8.7 platelets 177.  Metabolic work-up shows creatinine of 1.8 insistent with his baseline  chronic kidney disease.  Urinalysis is 2+ bacteria large leukocyte greater than 50 white blood cells.  Chest x-ray unremarkable.  Coronavirus swab was negative.  Patient given 2 g of ceftriaxone.  Urology was consulted recommends retroperitoneal ultrasound  to evaluate for hydronephrosis.      Review of Systems -    Review of Systems    Constitutional: Positive for chills, fever  and malaise/fatigue.    HENT: Negative for congestion, ear pain, sinus pain and sore throat.     Eyes: Negative for blurred vision and photophobia.    Respiratory: Negative for cough, shortness of breath and wheezing.     Cardiovascular: Negative for chest pain, palpitations and leg swelling.    Gastrointestinal: Negative for abdominal pain, constipation, diarrhea, heartburn, nausea and vomiting.    Genitourinary: Positive for urgency. Negative for dysuria and flank pain.    Musculoskeletal: Negative for myalgias.    Skin: Negative for itching and rash.    Neurological: Negative for dizziness, weakness and headaches.    Endo/Heme/Allergies: Negative for polydipsia.     Psychiatric/Behavioral: Negative for depression. The patient is not nervous/anxious.              Past Medical History:     Past Medical History:        Diagnosis  Date         ?  Arthritis       ?  Benign prostate hyperplasia       ?  Bladder infection       ?  Chronic kidney disease       ?  Coronary arteriosclerosis       ?  Coronary atherosclerosis of artery bypass graft       ?  Decreased testosterone level       ?  Diverticular disease       ?  Essential hypertension       ?  Gout       ?  History of acute renal failure       ?  History of anemia       ?  History of malignant neoplasm of pancreas       ?  Hx of CABG       ?  Kidney stone       ?  Kidney stones       ?  Neuropathy       ?  Pancreatic cancer ([place])       ?  Sepsis ([place])           ?  Tremor             Past Surgical History:     Past Surgical History:         Procedure  Laterality  Date          ?  HX ORTHOPAEDIC              back          ?  HX OTHER SURGICAL    2006          whipple procedure done for pancreatic cancer          ?  HX UROLOGICAL  2012          Percutaneous extraction of a kidney stone w/ fragmentation procedure            Family History:     Family History         Problem  Relation  Age of Onset          ?  Alcohol abuse  Father       ?  Heart Disease  Brother            ?  Emphysema  Brother             Social History:     Social History          Socioeconomic History         ?  Marital status:  MARRIED              Spouse name:  Not on file         ?  Number of children:  Not on file     ?  Years of education:  Not on file     ?  Highest education level:  Not on file       Tobacco Use         ?  Smoking status:  Never Smoker     ?  Smokeless tobacco:  Never Used       Substance and Sexual Activity         ?  Alcohol use:  Not Currently     ?  Drug use:  Not Currently     ?  Sexual activity:  Not Currently       Social History Narrative          ** Merged History Encounter **                      Home Medications:     Prior to  Admission medications             Medication  Sig  Start Date  End Date  Taking?  Authorizing Provider            metoprolol tartrate (LOPRESSOR) 25 mg tablet  Take 25 mg by mouth daily.        Other, Phys, MD     gabapentin (NEURONTIN) 600 mg tablet  Take 600 mg by mouth two (2) times a day.        Other, Phys, MD     amLODIPine (NORVASC) 5 mg tablet  Take 5 mg by mouth daily.        Other, Phys, MD     allopurinol (ZYLOPRIM) 300 mg tablet  Take  by mouth daily.        Other, Phys, MD     tamsulosin (FLOMAX) 0.4 mg capsule  Take 0.4 mg by mouth daily.        Other, Phys, MD     lipase-protease-amylase (ZENPEP) 20,000-63,000- 84,000 unit capsule  Take 8 Caps by mouth daily.        Other, Phys, MD     tiZANidine (ZANAFLEX) 4 mg tablet  Take 4 mg by mouth nightly.        Other, Phys, MD     fluticasone propionate 100 mcg/actuation dsdv  Take  by inhalation.        Other, Phys, MD     aspirin 81 mg chewable tablet  Take 81 mg by mouth daily.  Other, Phys, MD            TESTOSTERONE IM  by IntraMUSCular route.        Other, Phys, MD           Allergies:     Allergies        Allergen  Reactions         ?  Other Food  Other (comments)             VISTAID   ANESTHESIA         ?  Codeine  Itching and Swelling     ?  Oxycodone  Anaphylaxis     ?  Hydroxyzine Pamoate  Itching and Other (comments)             Other reaction(s): Unknown            ?  Meperidine  Itching, Palpitations, Swelling and Other (comments)             Other reaction(s): Unknown            ?  Metoclopramide  Itching, Other (comments) and Swelling             Other reaction(s): Other (See Comments)   Reaction unknown    Reaction unknown             ?  Morphine  Other (comments) and Nausea and Vomiting     ?  Oxycodone-Acetaminophen  Itching     ?  Prochlorperazine  Itching, Other (comments) and Unknown (comments)             Other reaction(s): Unknown   Unsure of reaction            ?  Hydroxyzine Hcl  Hives                Physical Exam:         Visit Vitals      BP  133/56     Pulse  66     Temp  98.5 ??F (36.9 ??C)     Resp  16     Ht  '5\' 8"'$  (1.727 m)     Wt  59.9 kg (132 lb)     SpO2  96%        BMI  20.07 kg/m??        Physical Exam   Constitutional :        General: He is not in acute distress.     Appearance: He is not diaphoretic.    HENT:       Head: Normocephalic and atraumatic.      Right Ear: External ear normal.      Left Ear: External ear normal.      Nose: Nose normal.      Mouth/Throat:      Dentition: Normal dentition.   Eyes:       General:         Right eye: No discharge.         Left eye: No discharge.      Conjunctiva/sclera: Conjunctivae normal.      Pupils: Pupils are equal, round, and reactive to light.   Neck:       Musculoskeletal: Neck supple.      Thyroid: No thyromegaly.      Vascular: No JVD.    Cardiovascular:       Rate and Rhythm: Normal rate and regular rhythm.      Heart  sounds: Normal heart sounds. No murmur. No friction rub.    Pulmonary:       Effort: Pulmonary effort is normal. No respiratory distress.      Breath sounds: Normal breath sounds. No wheezing.    Abdominal:      General: Bowel sounds are normal. There is no distension.      Palpations: Abdomen is soft.      Tenderness: There is no abdominal tenderness. There is no guarding or  rebound.     Musculoskeletal: Normal range of motion.    Skin:      General: Skin is warm and dry.   Neurological :       Mental Status: He is alert and oriented to person, place, and time.      Cranial Nerves: No cranial nerve deficit.      Coordination: Coordination normal.      Deep Tendon Reflexes: Reflexes are normal and symmetric.   Psychiatric:          Mood and Affect: Mood and affect normal.         Cognition and Memory: Memory normal.         Judgment: Judgment normal.             Intake and Output:   Current Shift:  No intake/output data recorded.   Last three shifts:  No intake/output data recorded.      Lab/Data Reviewed:     Recent Results (from the past 24 hour(s))     CBC  WITH AUTOMATED DIFF          Collection Time: 07/15/18  2:05 PM         Result  Value  Ref Range            WBC  7.7  4.0 - 11.0 1000/mm3       RBC  3.16 (L)  3.80 - 5.70 M/uL       HGB  8.7 (L)  12.4 - 17.2 gm/dl       HCT  28.2 (L)  37.0 - 50.0 %       MCV  89.2  80.0 - 98.0 fL       MCH  27.5  23.0 - 34.6 pg       MCHC  30.9  30.0 - 36.0 gm/dl       PLATELET  177  140 - 450 1000/mm3       MPV  12.0 (H)  6.0 - 10.0 fL       RDW-SD  62.3 (H)  35.1 - 43.9         NRBC  0  0 - 0         IMMATURE GRANULOCYTES  0.5  0.0 - 3.0 %       NEUTROPHILS  81.8 (H)  34 - 64 %       LYMPHOCYTES  8.5 (L)  28 - 48 %       MONOCYTES  8.2  1 - 13 %       EOSINOPHILS  0.7  0 - 5 %       BASOPHILS  0.3  0 - 3 %       METABOLIC PANEL, COMPREHENSIVE          Collection Time: 07/15/18  2:05 PM         Result  Value  Ref Range            Sodium  137  136 - 145 mEq/L       Potassium  4.5  3.5 - 5.1 mEq/L       Chloride  108 (H)  98 - 107 mEq/L       CO2  21  21 - 32 mEq/L       Glucose  125 (H)  74 - 106 mg/dl       BUN  36 (H)  7 - 25 mg/dl       Creatinine  1.8 (H)  0.6 - 1.3 mg/dl       GFR est AA  48.0          GFR est non-AA  39          Calcium  9.0  8.5 - 10.1 mg/dl       AST (SGOT)  35  15 - 37 U/L       ALT (SGPT)  40  12 - 78 U/L       Alk. phosphatase  190 (H)  45 - 117 U/L       Bilirubin, total  0.7  0.2 - 1.0 mg/dl       Protein, total  8.0  6.4 - 8.2 gm/dl       Albumin  2.7 (L)  3.4 - 5.0 gm/dl       Anion gap  8  5 - 15 mmol/L       LACTIC ACID          Collection Time: 07/15/18  2:05 PM         Result  Value  Ref Range            Lactic Acid  1.9  0.4 - 2.0 mmol/L       URINALYSIS W/ RFLX MICROSCOPIC          Collection Time: 07/15/18  2:42 PM         Result  Value  Ref Range            Color  YELLOW  YELLOW,STRAW         Appearance  CLOUDY (A)  CLEAR         Glucose  NEGATIVE  NEGATIVE,Negative mg/dl       Bilirubin  NEGATIVE  NEGATIVE,Negative         Ketone  NEGATIVE  NEGATIVE,Negative mg/dl       Specific gravity   1.020  1.005 - 1.030         Blood  LARGE (A)  NEGATIVE,Negative         pH (UA)  5.5  5.0 - 9.0         Protein  30 (A)  NEGATIVE,Negative mg/dl       Urobilinogen  0.2  0.0 - 1.0 mg/dl       Nitrites  NEGATIVE  NEGATIVE,Negative         Leukocyte Esterase  LARGE (A)  NEGATIVE,Negative         POC URINE MICROSCOPIC          Collection Time: 07/15/18  2:42 PM         Result  Value  Ref Range            WBC  >50  /HPF       RBC  30-49  /HPF       Bacteria  2+  /HPF       POC URINE MACROSCOPIC  Collection Time: 07/15/18  2:55 PM         Result  Value  Ref Range            Glucose  Negative  NEGATIVE,Negative mg/dl       Bilirubin  Negative  NEGATIVE,Negative         Ketone  Negative  NEGATIVE,Negative mg/dl       Specific gravity  1.020  1.005 - 1.030              Blood  Large (A)  NEGATIVE,Negative              pH (UA)  5.5  5 - 9         Protein  100 (A)  NEGATIVE,Negative mg/dl       Urobilinogen  0.2  0.0 - 1.0 EU/dl       Nitrites  Negative  NEGATIVE,Negative         Leukocyte Esterase  Moderate (A)  NEGATIVE,Negative         Color  Amber               Appearance  Turbid              [place] Results (most recent):     Results from Hospital Encounter encounter on 07/15/18     [place] RETROPERITONEUM COMP           Narrative  Examination: Ultrasound retroperitoneum complete      INDICATION: Renal ultrasound for stent.      FINDINGS:      Exam limited due to patient body habitus and motion. RRT called on patient.      The right kidney roughly measures 10.5 x 7.0 x 4.3 cm there are multiple cysts   are identified within the right kidney, measuring up to 1.9 cm within the   midpole. No hydronephrosis. Renal parenchyma appears mildly echogenic. No focal   mass.      The left kidney roughly measures 4.4 x 8.4 x 6.2 cm. Multiple cysts are   identified within the left kidney, measuring up to 2.4 x 2.9 cm within the   midpole. There is mild hydronephrosis with the AP dimension of the pelvis   measuring 2.1 cm. Renal  parenchyma slightly echogenic. No mass.      Prevoid urinary bladder volume measures 568 mL. Post void volume measures 408   mL. Stent is identified within the urinary bladder and the left kidney.      Prostate volume measures 31.18 mL per              Impression  IMPRESSION:   1. Both kidneys are slightly echogenic, concerning for medical renal disease.   2. Mild left-sided hydronephrosis with a stent which appears in customary   position.   3. Small bilateral renal cysts.              Jonnie Kind, DO      Jul 15, 2018   3:36 PM      Dragon medical dictation software was used for portions of this report.   Unintended voice transcription errors may have occurred.

## 2018-07-15 NOTE — Progress Notes (Signed)
 prelim code r note    Last admission date 4/27-5/1    reason for last admission aki    MD odonnell    disposition at discharge home    Insurance medicare a&b, generic commercial

## 2018-07-15 NOTE — Progress Notes (Signed)
 RRT called to Ultrasound for patient shaking. On arrival patient is talking and oriented in no acute distress but is shaking.  Patient is being seen for UTI and is in U/S for evaluation on his renal stents.  Pt is hot to the touch.  Returned to the ED where temperature was found to be 102.9.  ED nurse made aware.

## 2018-07-15 NOTE — Progress Notes (Signed)
Dr. Sandi Mariscal notified of temp 104.4, will order now dose of IV tylenol. Will continue to monitor.

## 2018-07-15 NOTE — ED Provider Notes (Signed)
ED Provider Notes by Jenelle Mages, PA-C at 07/15/18 1338                Author: Jenelle Mages, PA-C  Service: Emergency Medicine  Author Type: Physician Assistant       Filed: 07/15/18 1557  Date of Service: 07/15/18 1338  Status: Attested Addendum          Editor: Norris Cross (Physician Assistant)       Related Notes: Original Note by Norris Cross (Physician Assistant) filed at 07/15/18  1557          Cosigner: Maylon Cos, MD at 07/15/18 1745          Attestation signed by Maylon Cos, MD at 07/15/18 1745          Continued by Dr. Hermina Barters dictation software was used for portions of this report. Unintended errors in transcription may occur.      I interviewed and examined the patient. I discussed with the mid-level provider agree with their evaluation and plan as documented here.      Patient is awake alert his lungs are clear his heart is regular his abdomen is not tender.  He denies abdominal or back pain.   Patient was evaluated with labs, also KUB to evaluate for stent placement.   Patient was given IV Rocephin we reviewed his previous culture results      Patient was informed of his results he will require admission.     I spoke to Dr. Ihor Dow who will admit him to telemetry, I spoke to [facility] urology who asked me to keep him n.p.o. for right now and order a renal ultrasound which they will follow-up.                                 [facility]   Emergency Department Treatment Report                Patient: Tommy Brown  Age: 73 y.o.  Sex: male          Date of Birth: 1945/06/14  Admit Date: 07/15/2018  PCP: Daralene Milch, MD     MRN: [id number]   CSN: [id number]            Room: ER08/ER08  Time Dictated: 1:38 PM          Attending MD: Maylon Cos, MD   APC:  Maryanna Shape PA-C      Chief Complaint    Chills, weakness/fatigue        History of Present Illness     73 y.o. male  with a hx of CAD, chronic  kidney disease, renal colic, recent stent placement/laser litho end of April 2020 comes in for 5 days of shaking chills, malaise and fatigue, fever up to 104.  Ureteral stent is still in place.  States he felt the worst on day  1 of illness.  Has not have vomiting or diarrhea.  No UTI s/s, no flank or abdominal pain.  No cough or shortness of breath.  No chest pain.  Other than being admitted here at the end of April, he has been home with his wife who has not been ill.  He  denies sick contacts.  Is currently on Levaquin prescribed by PCP x several days.  Tried  to contact the urology office for eval and they told him to come here for further evaluation to rule out coronavirus/urine infection.        Review of Systems     Review of Systems    Constitutional: Positive for chills, fever  and malaise/fatigue. Negative for diaphoresis.    HENT: Negative for congestion and sore throat.     Eyes: Negative for discharge.    Respiratory: Negative for cough and shortness of breath.     Cardiovascular: Negative for chest pain and palpitations.    Gastrointestinal: Negative for abdominal pain, diarrhea, nausea and vomiting.    Genitourinary: Negative for dysuria, frequency, hematuria and urgency.         Has a ureteral stent in place    Musculoskeletal: Negative for back pain, falls and myalgias.    Skin: Negative for rash.    Neurological: Negative for dizziness, tingling, focal weakness, loss of consciousness and headaches.    Psychiatric/Behavioral: The patient is nervous/anxious.          Past Medical/Surgical History          Past Medical History:        Diagnosis  Date         ?  Arthritis       ?  Benign prostate hyperplasia       ?  Bladder infection       ?  Chronic kidney disease       ?  Coronary arteriosclerosis       ?  Coronary atherosclerosis of artery bypass graft       ?  Decreased testosterone level       ?  Diverticular disease       ?  Essential hypertension       ?  Gout       ?  History of acute renal  failure       ?  History of anemia       ?  History of malignant neoplasm of pancreas       ?  Hx of CABG       ?  Kidney stone       ?  Kidney stones       ?  Neuropathy       ?  Pancreatic cancer ([place])       ?  Sepsis ([place])           ?  Tremor            Past Surgical History:         Procedure  Laterality  Date          ?  HX ORTHOPAEDIC              back          ?  HX OTHER SURGICAL    2006          whipple procedure done for pancreatic cancer          ?  HX UROLOGICAL    2012          Percutaneous extraction of a kidney stone w/ fragmentation procedure           Social History          Social History          Socioeconomic History         ?  Marital status:  MARRIED  Spouse name:  Not on file         ?  Number of children:  Not on file     ?  Years of education:  Not on file         ?  Highest education level:  Not on file       Occupational History        ?  Not on file       Social Needs         ?  Financial resource strain:  Not on file        ?  Food insecurity              Worry:  Not on file         Inability:  Not on file        ?  Transportation needs              Medical:  Not on file         Non-medical:  Not on file       Tobacco Use         ?  Smoking status:  Never Smoker     ?  Smokeless tobacco:  Never Used       Substance and Sexual Activity         ?  Alcohol use:  Not Currently     ?  Drug use:  Not Currently     ?  Sexual activity:  Not Currently       Lifestyle        ?  Physical activity              Days per week:  Not on file         Minutes per session:  Not on file         ?  Stress:  Not on file       Relationships        ?  Social Health visitor on phone:  Not on file         Gets together:  Not on file         Attends religious service:  Not on file         Active member of club or organization:  Not on file         Attends meetings of clubs or organizations:  Not on file         Relationship status:  Not on file        ?  Intimate partner violence               Fear of current or ex partner:  Not on file         Emotionally abused:  Not on file         Physically abused:  Not on file         Forced sexual activity:  Not on file        Other Topics  Concern        ?  Not on file       Social History Narrative          ** Merged History Encounter **                     Family History  Family History         Problem  Relation  Age of Onset          ?  Alcohol abuse  Father       ?  Heart Disease  Brother            ?  Emphysema  Brother            Current Medications          Prior to Admission Medications     Prescriptions  Last Dose  Informant  Patient Reported?  Taking?      TESTOSTERONE IM      Yes  No      Sig: by IntraMUSCular route.      allopurinol (ZYLOPRIM) 300 mg tablet      Yes  No      Sig: Take  by mouth daily.      amLODIPine (NORVASC) 5 mg tablet      Yes  No      Sig: Take 5 mg by mouth daily.      aspirin 81 mg chewable tablet      Yes  No      Sig: Take 81 mg by mouth daily.      fluticasone propionate 100 mcg/actuation dsdv      Yes  No      Sig: Take  by inhalation.      gabapentin (NEURONTIN) 600 mg tablet      Yes  No      Sig: Take 600 mg by mouth two (2) times a day.      lipase-protease-amylase (ZENPEP) 20,000-63,000- 84,000 unit capsule      Yes  No      Sig: Take 8 Caps by mouth daily.      metoprolol tartrate (LOPRESSOR) 25 mg tablet      Yes  No      Sig: Take 25 mg by mouth daily.      tamsulosin (FLOMAX) 0.4 mg capsule      Yes  No      Sig: Take 0.4 mg by mouth daily.      tiZANidine (ZANAFLEX) 4 mg tablet      Yes  No      Sig: Take 4 mg by mouth nightly.               Facility-Administered Medications: None          Allergies          Allergies        Allergen  Reactions         ?  Other Food  Other (comments)             VISTAID   ANESTHESIA         ?  Codeine  Itching and Swelling     ?  Oxycodone  Anaphylaxis     ?  Hydroxyzine Pamoate  Itching and Other (comments)             Other reaction(s): Unknown            ?   Meperidine  Itching, Palpitations, Swelling and Other (comments)             Other reaction(s): Unknown            ?  Metoclopramide  Itching, Other (comments) and Swelling             Other reaction(s): Other (  See Comments)   Reaction unknown    Reaction unknown             ?  Morphine  Other (comments) and Nausea and Vomiting     ?  Oxycodone-Acetaminophen  Itching     ?  Prochlorperazine  Itching, Other (comments) and Unknown (comments)             Other reaction(s): Unknown   Unsure of reaction            ?  Hydroxyzine Hcl  Hives          Physical Exam          ED Triage Vitals     ED Encounter Vitals Group            BP  07/15/18 1322  133/56        Pulse (Heart Rate)  07/15/18 1322  66        Resp Rate  07/15/18 1322  16        Temp  07/15/18 1322  98.5 ??F (36.9 ??C)        Temp src  --          O2 Sat (%)  07/15/18 1322  96 %        Weight  07/15/18 1320  132 lb            Height  07/15/18 1320  5' 8"        Physical Exam   Constitutional :        Appearance: He is not ill-appearing, toxic-appearing or diaphoretic.      Comments: Having chills while I'm in the room   HENT:       Head: Normocephalic and atraumatic.   Eyes :       Conjunctiva/sclera: Conjunctivae normal.   Neck :       Musculoskeletal: Normal range of motion and neck supple.   Cardiovascular :       Rate and Rhythm: Normal rate and regular rhythm.   Pulmonary :       Effort: Pulmonary effort is normal.      Breath sounds: Normal breath sounds.   Abdominal :      General: Abdomen is flat.      Palpations: Abdomen is soft.      Tenderness: There is no abdominal tenderness.     Musculoskeletal: Normal range of motion.    Skin:      General: Skin is warm and dry.      Capillary Refill: Capillary refill takes less than 2 seconds.      Findings: No rash.    Neurological:       General: No focal deficit present.      Mental Status: He is alert and oriented to person, place, and time.    Psychiatric:         Mood and Affect: Mood normal.             Impression and Management Plan        Patient presenting to the emergency department complaining of shaking chills, malaise fatigue and high fever.  He had a recent urologic procedure and has an indwelling stent, concern for UTI although not having flank or abdominal pain.  Vital signs stable  but with malaise fatigue and high fever consider sepsis.  Consider coronavirus thought less likely.  Will send sepsis labs, obtain urine culture.  Obtain CXR to rule out pneumonia as well  as KUB to look for stent placement/stone.        Diagnostic Studies     Lab:      Recent Results (from the past 12 hour(s))     CBC WITH AUTOMATED DIFF          Collection Time: 07/15/18  2:05 PM         Result  Value  Ref Range            WBC  7.7  4.0 - 11.0 1000/mm3            RBC  3.16 (L)  3.80 - 5.70 M/uL       HGB  8.7 (L)  12.4 - 17.2 gm/dl       HCT  28.2 (L)  37.0 - 50.0 %       MCV  89.2  80.0 - 98.0 fL       MCH  27.5  23.0 - 34.6 pg       MCHC  30.9  30.0 - 36.0 gm/dl       PLATELET  177  140 - 450 1000/mm3       MPV  12.0 (H)  6.0 - 10.0 fL       RDW-SD  62.3 (H)  35.1 - 43.9         NRBC  0  0 - 0         IMMATURE GRANULOCYTES  0.5  0.0 - 3.0 %       NEUTROPHILS  81.8 (H)  34 - 64 %       LYMPHOCYTES  8.5 (L)  28 - 48 %       MONOCYTES  8.2  1 - 13 %       EOSINOPHILS  0.7  0 - 5 %       BASOPHILS  0.3  0 - 3 %       METABOLIC PANEL, COMPREHENSIVE          Collection Time: 07/15/18  2:05 PM         Result  Value  Ref Range            Sodium  137  136 - 145 mEq/L       Potassium  4.5  3.5 - 5.1 mEq/L       Chloride  108 (H)  98 - 107 mEq/L       CO2  21  21 - 32 mEq/L       Glucose  125 (H)  74 - 106 mg/dl       BUN  36 (H)  7 - 25 mg/dl       Creatinine  1.8 (H)  0.6 - 1.3 mg/dl       GFR est AA  48.0          GFR est non-AA  39          Calcium  9.0  8.5 - 10.1 mg/dl       AST (SGOT)  35  15 - 37 U/L       ALT (SGPT)  40  12 - 78 U/L       Alk. phosphatase  190 (H)  45 - 117 U/L       Bilirubin, total  0.7  0.2 - 1.0 mg/dl        Protein, total  8.0  6.4 - 8.2 gm/dl       Albumin  2.7 (L)  3.4 - 5.0 gm/dl       Anion gap  8  5 - 15 mmol/L       LACTIC ACID          Collection Time: 07/15/18  2:05 PM         Result  Value  Ref Range            Lactic Acid  1.9  0.4 - 2.0 mmol/L       COVID-19 (INPATIENT TESTING)          Collection Time: 07/15/18  2:05 PM         Result  Value  Ref Range            COVID-19  NEGATIVE  NEGATIVE         URINALYSIS W/ RFLX MICROSCOPIC          Collection Time: 07/15/18  2:42 PM         Result  Value  Ref Range            Color  YELLOW  YELLOW,STRAW         Appearance  CLOUDY (A)  CLEAR         Glucose  NEGATIVE  NEGATIVE,Negative mg/dl       Bilirubin  NEGATIVE  NEGATIVE,Negative         Ketone  NEGATIVE  NEGATIVE,Negative mg/dl       Specific gravity  1.020  1.005 - 1.030         Blood  LARGE (A)  NEGATIVE,Negative         pH (UA)  5.5  5.0 - 9.0         Protein  30 (A)  NEGATIVE,Negative mg/dl       Urobilinogen  0.2  0.0 - 1.0 mg/dl       Nitrites  NEGATIVE  NEGATIVE,Negative         Leukocyte Esterase  LARGE (A)  NEGATIVE,Negative         POC URINE MICROSCOPIC          Collection Time: 07/15/18  2:42 PM         Result  Value  Ref Range            WBC  >50  /HPF       RBC  30-49  /HPF       Bacteria  2+  /HPF       POC URINE MACROSCOPIC          Collection Time: 07/15/18  2:55 PM         Result  Value  Ref Range            Glucose  Negative  NEGATIVE,Negative mg/dl       Bilirubin  Negative  NEGATIVE,Negative         Ketone  Negative  NEGATIVE,Negative mg/dl       Specific gravity  1.020  1.005 - 1.030         Blood  Large (A)  NEGATIVE,Negative         pH (UA)  5.5  5 - 9              Protein  100 (A)  NEGATIVE,Negative mg/dl            Urobilinogen  0.2  0.0 - 1.0 EU/dl       Nitrites  Negative  NEGATIVE,Negative  Leukocyte Esterase  Moderate (A)  NEGATIVE,Negative         Color  Amber               Appearance  Turbid             Labs Reviewed       CBC WITH AUTOMATED DIFF - Abnormal; Notable for  the following components:            Result  Value            RBC  3.16 (*)         HGB  8.7 (*)         HCT  28.2 (*)         MPV  12.0 (*)         RDW-SD  62.3 (*)         NEUTROPHILS  81.8 (*)         LYMPHOCYTES  8.5 (*)            All other components within normal limits       METABOLIC PANEL, COMPREHENSIVE - Abnormal; Notable for the following components:            Chloride  108 (*)         Glucose  125 (*)         BUN  36 (*)         Creatinine  1.8 (*)         Alk. phosphatase  190 (*)         Albumin  2.7 (*)            All other components within normal limits       URINALYSIS W/ RFLX MICROSCOPIC - Abnormal; Notable for the following components:            Appearance  CLOUDY (*)         Blood  LARGE (*)         Protein  30 (*)         Leukocyte Esterase  LARGE (*)            All other components within normal limits       POC URINE MACROSCOPIC - Abnormal; Notable for the following components:            Blood  Large (*)         Protein  100 (*)         Leukocyte Esterase  Moderate (*)            All other components within normal limits       CULTURE, BLOOD     CULTURE, BLOOD     CULTURE, URINE     COVID-19 (INPATIENT TESTING)     LACTIC ACID     PROCALCITONIN     LACTIC ACID       POC URINE MICROSCOPIC        Imaging:     Xr Chest Sngl V      Result Date: 07/15/2018   AP chest. Comparison 05/19/2018 INDICATION: Sepsis.       IMPRESSION: Midline sternotomy. Cardiac silhouette normal. Lungs clear of any acute process.       Xr Abd (kub)      Result Date: 07/15/2018   Supine abdomen. INDICATION: Fever. FINDINGS: Left-sided double-J catheter in place. Multiple calcifications overlying the left kidney. Scattered arthritis lumbar spine. Previous lower lumbar spine surgery  with pedicle screws L4 and L5 level. Penile prostheses  identified. Bowel gas pattern unremarkable.       IMPRESSION: Findings as described above         ED Course        Patient was maintained on cardiac, oxygen, and blood pressure monitoring  without arrhythmia or event.      Patient Vitals for the past 12 hrs:            Temp  Pulse  Resp  BP  SpO2            07/15/18 1322  98.5 ??F (36.9 ??C)  66  16  133/56  96 %             ED Course as of Jul 15 1555       Wed Jul 15, 2018        1420  Discussed the patient's history, physical and plan with Dr. Emeterio Reeve who is in agreement with orders as placed.       [EI]     1441  WBC: 7.7 [EI]     1441  Chronic, not acutely changed    HGB(!): 8.7 [EI]     1452  Awaiting microscopic    Leukocyte Esterase(!): Moderate [EI]     1452  Blood(!): Large [EI]     1501  Creatinine(!): 1.8 [EI]     1501  BUN(!): 36 [EI]     1501  Potassium: 4.5 [EI]     1513  WBC: >50 [EI]     1513  Cultured already- ordering antibiotics    RBC: 30-49 [EI]     1513  Bacteria: 2+ [EI]     1513  IMPRESSION: Midline sternotomy. Cardiac silhouette normal. Lungs clear of any   acute process.    XR CHEST SNGL V [EI]     3716  FINDINGS: Left-sided double-J catheter in place. Multiple calcifications   overlying the left kidney.     XR ABD (KUB) [EI]     1521  Reviewed patient's urine culture from April.  30,000 E. coli resistant to Cipro and Levaquin otherwise susceptible.  Discussed test results with Dr.  Emeterio Reeve- Rocephin IV ordered.     [EI]        1521  Lactic Acid: 1.9 [EI]              ED Course User Index   [EI] Jenelle Mages, PA-C        Dr. Emeterio Reeve discussed with Dr. Tonye Royalty who agrees to admission.   Urology paged for consult @ time of admission.        Medications       cefTRIAXone (ROCEPHIN) 2 g in 0.9% sodium chloride (MBP/ADV) 50 mL MBP (2 g IntraVENous New Bag 07/15/18 1548)       sodium chloride 0.9 % bolus infusion 1,797 mL (0 mL/kg ?? 59.9 kg IntraVENous IV Completed 07/15/18 1552)             Medical Decision Making     Patient presenting with fever, chills, malaise and fatigue, some urinary frequency.  He has an indwelling urinary stent which appears to be in good placement on KUB.   He has no leukocytosis but does have  evidence of UTI on microscopic urinalysis, culture pending.  Lactic acid is reassuringly normal.  Patient has been given Rocephin, will be brought into the hospital for further treatment and urology consult.  VSS  while in the  ED on monitor.          Final Diagnosis                 ICD-10-CM  ICD-9-CM          1.  Complicated UTI (urinary tract infection)  N39.0  599.0     2.  Fever and chills  R50.9  780.60     3.  Malaise and fatigue  R53.81  780.79           R53.83            4.  Chronic renal impairment, unspecified CKD stage  N18.9  585.9          Disposition     Hospital admission to telemetry      The patient was personally evaluated by myself and [facility], SARA N, MD who agrees with the above assessment and plan.      Iyannah Blake E. Gustavus Messing   Jul 15, 2018      My signature above authenticates this document and my orders, the final ??   diagnosis (es), discharge prescription (s), and instructions in the Epic ??   record.   If you have any questions please contact [phone].   ??   Nursing notes have been reviewed by the physician/ advanced practice ??   Clinician.      Dragon medical dictation software was used for portions of this report. Unintended voice recognition errors may occur.

## 2018-07-15 NOTE — Consults (Signed)
Consults by Jimmey Ralph, PA-C at 07/15/18 1611                Author: Jimmey Ralph, PA-C  Service: Urology  Author Type: Physician Assistant       Filed: 07/15/18 1701  Date of Service: 07/15/18 1611  Status: Addendum          Editor: Simonne Maffucci (Physician Assistant)       Related Notes: Original Note by Simonne Maffucci (Physician Assistant) filed at 07/15/18  1700          Cosigner: Given, [name], MD at 07/15/18 1706            Consult Orders        1. CONSULT TO PHYSICIAN [[id number]] ordered by Maylon Cos, MD at 07/15/18 1531                                               1.  Complicated UTI (urinary tract infection)         2.  Fever and chills      3.  Malaise and fatigue         4.  Chronic renal impairment, unspecified CKD stage            ASSESSMENT:    - Complicated UTI ??- failed outpatient treatment of Levoquin    - Left 62mm proximal ureteral stone with hydroureteronephrosis and 17mm LLP stone   ????????????????????????- s/p cystoscopy, left JJ stent on 06/22/2018 by Dr. Claiborne Billings    - AKI with baseline creatinine 1.5   ????????????????????????- Peak creatinine 1.8 (5/20)               - Current creatinine 1.8    - Fevers (up to 104.5) and rigors at home - temperature upon admission 98.5    - WBC upon admission 7.7        - Hx of nephrolithiasis s/p URS with LL in 02/2017   - Hx of ED with IPP   - Hx of Pancreatic cancer s/p Whipple in 2006, chemotherapy and radiation, chronic anemia          PLAN:     - KUB shows stent in position, will obtain renal ultrasound to assess for hydronephrosis    - Urine culture pending - antibiotics as per primary team - currently on Rocephin    - Will monitor PVRs    - NPO until after renal ultrasound - if no hydronephrosis he can eat. If there is hydronephrosis, will need non-contrast CT of abd/pelvis and possible left JJ stent exchange tonight vs in AM. Please call on call urology when these results are finalized    - Message sent to [facility] to tentatively add on for Tuesday 5/26 for cystoscopy, ureteroscopy with laser lithotripsy, left JJ stent exchange vs removal as patient would like definitive stone treatment and stent removal during this visit. This can  only be done if negative urine culture and patient stable.    - Will continue to follow       Follow up arranged? NO - will need follow up with Dr. Oleta Mouse .         [place], PA-C      [phone]  Chief Complaint       Patient presents with        ?  Covid Testing        ?  Abnormal Lab Results           HISTORY OF PRESENT ILLNESS:  Tommy Brown  is a 73 y.o. male who is seen  in consultation as referred by Bosie Helper for complicated UTI. Patient had  Left 38mm proximal ureteral stone with hydroureteronephrosis and 13mm LLP stone s/p cystoscopy, left JJ stent on 06/22/2018 by Dr. Claiborne Billings. He has not had surgery yet for  these stones. With Ecoli UTI He is followed by Dr. Oleta Mouse.       He presented to [facility] for fevers up to 104.5, shaking chills,  And fatigue x 5 days. States he felt the worst on day 1 of illness.  Has not have vomiting or diarrhea.  Denies dysuria, gross hematuria, urgency, frequency, flank, and  abdominal pain.  No  cough or shortness of breath.                   AUA Symptom Score  07/08/2018        Over the past month how often have you had the sensation that your bladder was not completely empty after you finished urinating?  0     Over the past month, how often have had to urinate again less than 2 hours after you last finished urinating?  0     Over the past month, how often have you found you stopped and started again several times when you urinated?  0     Over the past month, how often have you found it difficult to postpone urination?  0     Over the past month, how often have you had a weak urinary stream?  0     Over the past month, how often have you had to push or strain to begin urinating?  0     Over the past month, how many times  did you most typically get up to urinate from the time you went to bed at night until the time you got up in  the morning?  0     AUA Score  0        If you were to spend the rest of your life with your urinary condition the way it is now, how would you feel about that?  Mostly satisfied                Past Medical History:        Diagnosis  Date         ?  Arthritis       ?  Benign prostate hyperplasia       ?  Bladder infection       ?  Chronic kidney disease       ?  Coronary arteriosclerosis       ?  Coronary atherosclerosis of artery bypass graft       ?  Decreased testosterone level       ?  Diverticular disease       ?  Essential hypertension       ?  Gout       ?  History of acute renal failure       ?  History of anemia       ?  History of malignant neoplasm of pancreas       ?  Hx of CABG       ?  Kidney stone       ?  Kidney stones       ?  Neuropathy       ?  Pancreatic cancer ([place]Perez)       ?  Sepsis ([place])           ?  Tremor               Past Surgical History:         Procedure  Laterality  Date          ?  HX ORTHOPAEDIC              back          ?  HX OTHER SURGICAL    2006          whipple procedure done for pancreatic cancer          ?  HX UROLOGICAL    2012          Percutaneous extraction of a kidney stone w/ fragmentation procedure              Social History          Tobacco Use         ?  Smoking status:  Never Smoker     ?  Smokeless tobacco:  Never Used       Substance Use Topics         ?  Alcohol use:  Not Currently         ?  Drug use:  Not Currently             Allergies        Allergen  Reactions         ?  Other Food  Other (comments)             VISTAID   ANESTHESIA         ?  Codeine  Itching and Swelling     ?  Oxycodone  Anaphylaxis     ?  Hydroxyzine Pamoate  Itching and Other (comments)             Other reaction(s): Unknown            ?  Meperidine  Itching, Palpitations, Swelling and Other (comments)             Other reaction(s): Unknown            ?  Metoclopramide  Itching,  Other (comments) and Swelling             Other reaction(s): Other (See Comments)   Reaction unknown    Reaction unknown             ?  Morphine  Other (comments) and Nausea and Vomiting     ?  Oxycodone-Acetaminophen  Itching     ?  Prochlorperazine  Itching, Other (comments) and Unknown (comments)             Other reaction(s): Unknown   Unsure of reaction            ?  Hydroxyzine Hcl  Hives             Family History         Problem  Relation  Age of Onset          ?  Alcohol abuse  Father       ?  Heart Disease  Brother            ?  Emphysema  Brother               Current Facility-Administered Medications             Medication  Dose  Route  Frequency  Provider  Last Rate  Last Dose              ?  cefTRIAXone (ROCEPHIN) 2 g in 0.9% sodium chloride (MBP/ADV) 50 mL MBP   2 g  IntraVENous  NOW  Icenbice, Erin E, PA-C  100 mL/hr at 07/15/18 1548  2 g at 07/15/18 1548              ?  dextrose 5 % - 0.45% NaCl infusion   50 mL/hr  IntraVENous  CONTINUOUS  Hutchinson, Anne, DO                Current Outpatient Medications          Medication  Sig  Dispense  Refill           ?  metoprolol tartrate (LOPRESSOR) 25 mg tablet  Take 25 mg by mouth daily.         ?  gabapentin (NEURONTIN) 600 mg tablet  Take 600 mg by mouth two (2) times a day.         ?  amLODIPine (NORVASC) 5 mg tablet  Take 5 mg by mouth daily.         ?  allopurinol (ZYLOPRIM) 300 mg tablet  Take  by mouth daily.         ?  tamsulosin (FLOMAX) 0.4 mg capsule  Take 0.4 mg by mouth daily.         ?  lipase-protease-amylase (ZENPEP) 20,000-63,000- 84,000 unit capsule  Take 8 Caps by mouth daily.         ?  aspirin 81 mg chewable tablet  Take 81 mg by mouth daily.               ?  TESTOSTERONE IM  by IntraMUSCular route.               Review of Systems   ROS is:      Negative for: Ophthalmologic issues, ENT issues, Cardiovascular issues, respiratory issues, GI issues, neurologic issues, hematoogic issues,  skin lesions, musculoskeletal issues, psychiatric  issues   Exceptions: yes      Positive for:    Fever, chills                      PHYSICAL EXAMINATION:    Visit Vitals      BP  133/56     Pulse  66     Temp  98.5 ??F (36.9 ??C)     Resp  16     Ht  5\' 8"  (1.727 m)     Wt  59.9 kg (132 lb)     SpO2  96%        BMI  20.07 kg/m??        Constitutional: Well developed, well nourished male.  No acute distress.     HEENT: Normocephalic, Atraumatic, EOM's intact    CV:  no edema   Respiratory: No respiratory distress or difficulties breathing    Abdomen:  soft and non tender    GU Male:  Mild Left CVA tenderness   EVO:[id number]    Skin: No  evidence of jaundice.  Normal color   Neuro/Psych:  Alert and oriented. Affect appropriate.    MSK: normal ROM          REVIEW OF LABS AND IMAGING:       KUB 07/15/2018   FINDINGS: Left-sided double-J catheter in place. Multiple calcifications   overlying the left kidney. Scattered arthritis lumbar spine. Previous lower   lumbar spine surgery with pedicle screws L4 and L5 level. Penile prostheses   identified. Bowel gas pattern unremarkable.   ??   IMPRESSION   IMPRESSION: Findings as described above          Labs:  Results:        Chemistry      Recent Labs         07/15/18   1405      GLU  125*      NA  137      K  4.5      CL  108*      CO2  21      BUN  36*      CREA  1.8*      CA  9.0      AGAP  8      AP  190*      TP  8.0      ALB  2.7*                 CBC w/Diff  Recent Labs         07/15/18   1405      WBC  7.7      RBC  3.16*      HGB  8.7*      HCT  28.2*      PLT  177      GRANS  81.8*      LYMPH  8.5*      EOS  0.7                 Cultures  No results for input(s): CULT in the last 72 hours.   All Micro Results         Procedure  Component  Value  Units  Date/Time        COVID-19 (INPATIENT TESTING) [[id number]]  Collected:  07/15/18 1405        Order Status:  Completed  Specimen:  NASOPHARYNGEAL SWAB  Updated:  07/15/18 1537          COVID-19  NEGATIVE               Comment:  Negative results do not preclude SARS-CoV-2  infection and should not be used as the sole basis   for treatment or other patient management decisions. Negative results must be combined with   clinical observations, patient history, and epidemiological information.   Testing with the Xpert Xpress SARS-CoV-2 test is intended for use by trained operators who are   proficient in performing tests using either GeneXpert Dx, GeneXpert Infinity and/or [employer]. The Xpert Xpress SARS-CoV-2 test is only for use under the Food and Drug   Administration's Emergency Use Authorization.                 CULTURE, URINE [[id number]]  Collected:  07/15/18 1442        Order Status:  Completed  Specimen:  Urine  Updated:  07/15/18 1521        CULTURE, BLOOD [[id number]]  Collected:  07/15/18 1442        Order Status:  Completed  Specimen:  Blood  Updated:  07/15/18 1500        CULTURE, BLOOD [[id number]]  Collected:  07/15/18 1405        Order Status:  Completed  Specimen:  Blood  Updated:  07/15/18 1433                             Urinalysis  Color      Date  Value  Ref Range  Status      07/15/2018  Amber     Final            Appearance      Date  Value  Ref Range  Status      07/15/2018  Turbid     Final            Specific gravity      Date  Value  Ref Range  Status      07/15/2018  1.020  1.005 - 1.030    Final            pH (UA)      Date  Value  Ref Range  Status      07/15/2018  5.5  5 - 9    Final            Protein      Date  Value  Ref Range  Status      07/15/2018  100 (A)  NEGATIVE,Negative mg/dl  Final            Ketone      Date  Value  Ref Range  Status      07/15/2018  Negative  NEGATIVE,Negative mg/dl  Final            Bilirubin      Date  Value  Ref Range  Status      07/15/2018  Negative  NEGATIVE,Negative    Final            Blood      Date  Value  Ref Range  Status      07/15/2018  Large (A)  NEGATIVE,Negative    Final          Comment:          Operator: [id number]            Urobilinogen      Date  Value  Ref Range  Status      07/15/2018  0.2   0.0 - 1.0 EU/dl  Final            Nitrites      Date  Value  Ref Range  Status      07/15/2018  Negative  NEGATIVE,Negative    Final            Leukocyte Esterase      Date  Value  Ref Range  Status      07/15/2018  Moderate (A)  NEGATIVE,Negative    Final            Potassium      Date  Value  Ref Range  Status      07/15/2018  4.5  3.5 - 5.1 mEq/L  Final            Creatinine      Date  Value  Ref Range  Status  07/15/2018  1.8 (H)  0.6 - 1.3 mg/dl  Final            BUN      Date  Value  Ref Range  Status      07/15/2018  36 (H)  7 - 25 mg/dl  Final            Prostate Specific Ag      Date  Value  Ref Range  Status      04/30/2018  0.6  0.0 - 4.0 ng/mL  Final                 PSA  No results for input(s): PSA in the last 72 hours.        Coagulation  No results found for: PTP, INR, APTT, INREXT

## 2018-07-16 LAB — METABOLIC PANEL, COMPREHENSIVE
ALT (SGPT): 30 U/L (ref 12–78)
AST (SGOT): 21 U/L (ref 15–37)
Albumin: 2.4 gm/dl — ABNORMAL LOW (ref 3.4–5.0)
Alk. phosphatase: 159 U/L — ABNORMAL HIGH (ref 45–117)
Anion gap: 8 mmol/L (ref 5–15)
BUN: 29 mg/dl — ABNORMAL HIGH (ref 7–25)
Bilirubin, total: 0.8 mg/dl (ref 0.2–1.0)
CO2: 19 mEq/L — ABNORMAL LOW (ref 21–32)
Calcium: 8.4 mg/dl — ABNORMAL LOW (ref 8.5–10.1)
Chloride: 111 mEq/L — ABNORMAL HIGH (ref 98–107)
Creatinine: 1.4 mg/dl — ABNORMAL HIGH (ref 0.6–1.3)
GFR est AA: >60
GFR est non-AA: 53
Glucose: 112 mg/dl — ABNORMAL HIGH (ref 74–106)
Potassium: 4.3 mEq/L (ref 3.5–5.1)
Protein, total: 7 gm/dl (ref 6.4–8.2)
Sodium: 139 mEq/L (ref 136–145)

## 2018-07-16 LAB — LACTIC ACID
LACTIC ACID: 1.3 mmol/L (ref 0.4–2.0)
LACTIC ACID: 1 mmol/L (ref 0.4–2.0)
Lactic Acid: 1.3 mmol/L (ref 0.4–2.0)
Lactic Acid: 1 mmol/L (ref 0.4–2.0)

## 2018-07-16 LAB — CBC W/O DIFF
HCT: 25.7 % — ABNORMAL LOW (ref 37.0–50.0)
HGB: 8 gm/dl — ABNORMAL LOW (ref 12.4–17.2)
MCH: 27.1 pg (ref 23.0–34.6)
MCHC: 31.1 gm/dl (ref 30.0–36.0)
MCV: 87.1 fL (ref 80.0–98.0)
MPV: 11.5 fL — ABNORMAL HIGH (ref 6.0–10.0)
PLATELET: 157 10*3/uL (ref 140–450)
RBC: 2.95 M/uL — ABNORMAL LOW (ref 3.80–5.70)
RDW-SD: 60.6 — ABNORMAL HIGH (ref 35.1–43.9)
WBC: 8.2 10*3/uL (ref 4.0–11.0)

## 2018-07-16 LAB — TSH 3RD GENERATION
TSH: 0.809 u[IU]/mL (ref 0.358–3.740)
TSH: 0.809 u[IU]/mL (ref 0.358–3.740)

## 2018-07-16 LAB — COMPREHENSIVE METABOLIC PANEL
ALT: 30 U/L (ref 12–78)
AST: 21 U/L (ref 15–37)
Albumin: 2.4 gm/dl — ABNORMAL LOW (ref 3.4–5.0)
Alkaline Phosphatase: 159 U/L — ABNORMAL HIGH (ref 45–117)
Anion Gap: 8 mmol/L (ref 5–15)
BUN: 29 mg/dl — ABNORMAL HIGH (ref 7–25)
CO2: 19 mEq/L — ABNORMAL LOW (ref 21–32)
Calcium: 8.4 mg/dl — ABNORMAL LOW (ref 8.5–10.1)
Chloride: 111 mEq/L — ABNORMAL HIGH (ref 98–107)
Creatinine: 1.4 mg/dl — ABNORMAL HIGH (ref 0.6–1.3)
EGFR IF NonAfrican American: 53
GFR African American: >60
Glucose: 112 mg/dl — ABNORMAL HIGH (ref 74–106)
Potassium: 4.3 mEq/L (ref 3.5–5.1)
Sodium: 139 mEq/L (ref 136–145)
Total Bilirubin: 0.8 mg/dl (ref 0.2–1.0)
Total Protein: 7 gm/dl (ref 6.4–8.2)

## 2018-07-16 LAB — CBC
Hematocrit: 25.7 % — ABNORMAL LOW (ref 37.0–50.0)
Hemoglobin: 8 gm/dl — ABNORMAL LOW (ref 12.4–17.2)
MCH: 27.1 pg (ref 23.0–34.6)
MCHC: 31.1 gm/dl (ref 30.0–36.0)
MCV: 87.1 fL (ref 80.0–98.0)
MPV: 11.5 fL — ABNORMAL HIGH (ref 6.0–10.0)
Platelets: 157 10*3/uL (ref 140–450)
RBC: 2.95 M/uL — ABNORMAL LOW (ref 3.80–5.70)
RDW-SD: 60.6 — ABNORMAL HIGH (ref 35.1–43.9)
WBC: 8.2 10*3/uL (ref 4.0–11.0)

## 2018-07-16 MED ORDER — LACTULOSE 10 GRAM/15 ML ORAL SOLUTION
10 gram/15 mL | Freq: Every day | ORAL | Status: DC | PRN
Start: 2018-07-16 — End: 2018-07-17

## 2018-07-16 MED ORDER — IPRATROPIUM-ALBUTEROL 2.5 MG-0.5 MG/3 ML NEB SOLUTION
2.5 mg-0.5 mg/3 ml | RESPIRATORY_TRACT | Status: DC | PRN
Start: 2018-07-16 — End: 2018-07-17

## 2018-07-16 MED ORDER — DEXTRAN 70-HYPROMELLOSE EYE DROPS
OPHTHALMIC | Status: DC | PRN
Start: 2018-07-16 — End: 2018-07-17
  Administered 2018-07-16: 15:00:00 via OPHTHALMIC

## 2018-07-16 MED ORDER — ONDANSETRON (PF) 4 MG/2 ML INJECTION
4 mg/2 mL | INTRAMUSCULAR | Status: DC | PRN
Start: 2018-07-16 — End: 2018-07-17

## 2018-07-16 MED ORDER — POLYETHYLENE GLYCOL 3350 17 GRAM (100 %) ORAL POWDER PACKET
17 gram | Freq: Every day | ORAL | Status: DC | PRN
Start: 2018-07-16 — End: 2018-07-17

## 2018-07-16 MED FILL — ALLOPURINOL 300 MG TAB: 300 mg | ORAL | Qty: 1

## 2018-07-16 MED FILL — GENTEAL TEARS MILD 0.1 %-0.3 % EYE DROPS: OPHTHALMIC | Qty: 15

## 2018-07-16 MED FILL — ACETAMINOPHEN 325 MG TABLET: 325 mg | ORAL | Qty: 2

## 2018-07-16 MED FILL — CEFTRIAXONE 1 GRAM SOLUTION FOR INJECTION: 1 gram | INTRAMUSCULAR | Qty: 1

## 2018-07-16 MED FILL — TAMSULOSIN SR 0.4 MG 24 HR CAP: 0.4 mg | ORAL | Qty: 1

## 2018-07-16 MED FILL — NORMAL SALINE FLUSH 0.9 % INJECTION SYRINGE: INTRAMUSCULAR | Qty: 10

## 2018-07-16 MED FILL — GABAPENTIN 300 MG CAP: 300 mg | ORAL | Qty: 2

## 2018-07-16 MED FILL — CREON 24,000-76,000-120,000 UNIT CAPSULE,DELAYED RELEASE: 24000-76000 -120,000 unit | ORAL | Qty: 2

## 2018-07-16 MED FILL — ASPIRIN 81 MG CHEWABLE TAB: 81 mg | ORAL | Qty: 1

## 2018-07-16 MED FILL — AMLODIPINE 5 MG TAB: 5 mg | ORAL | Qty: 1

## 2018-07-16 MED FILL — METOPROLOL SUCCINATE SR 25 MG 24 HR TAB: 25 mg | ORAL | Qty: 1

## 2018-07-16 MED FILL — ZOLPIDEM 5 MG TAB: 5 mg | ORAL | Qty: 1

## 2018-07-16 NOTE — Other (Signed)
CHANGE OF SHIFT REPORT:  ??  Bedside report given to Leeroy Bock, Therapist, sports (Soil scientist) from [place], Printmaker).  ??  Report consisted of the Situation, Background, Assessment and Recommendations (SBAR).  ??  Discussed and reviewed the patients:  -Code status  -Allergies  -Orientation status  -Diet (Regular)  -Medications  -VTE: (SCD's)  -Orders  -Procedures  -PMH  -Current Hospital Diagnosis  -Cardiac Monitoring (BOX# 33)  -IV Sites  -Activity Level  -Lab Values  -Vitals  -Plan of Care  -Discharge Plans (Home with wife by next week, 07/21/2018)  ??  Opportunity for questions and clarification were provided.

## 2018-07-16 NOTE — Progress Notes (Addendum)
0930-  Patient complained about burning eyes this AM. The patient allowed me to rinse his eyes with a sterile saline flush. He stated that he takes over the counter eye drops every morning because of the burning and itching of the eyes. Systane Ultra lubricating drops. This was not listed on the patients home medication list.  Medication list has been updated and completed. Dr. Nira Retort paged to request eye drops for the patient.    PLAN: Continue to monitor patient throughout shift.    NEW ORDER:  [id number]- Physician has ordered lubricating eye drops for the patient.    1700-  Notified that the patients temperature was 102.0. Tylenol given as instructed. Patient is being treated with antibiotics as well.    PLAN: Continue to monitor the patient throughout shift.    1750- Physician has been made aware of the patients fever and the administration of tylenol twice today.  No new orders obtained.    1830-  I&O's completed. IV Pump cleared for next shift.

## 2018-07-16 NOTE — Progress Notes (Signed)
Hospitalist Progress Note  Kristen Loader, MD  [facility] Hospitalists    Daily Progress Note: 07/16/2018    Assessment/Plan:     Complicated UTI  L Ureteral stent s/p lithotripsy 05/2018  CAD s/p CABG  CKD3  HTN  Gout  Pancreatic cancer s/p Whipple/XRT.  BPH  HLD  Metabolic acidosis    Plan:    Fever appears to have subsided  Urology input noted and appreciated  It appears that GU will be unable to perform procedure until late next week or the following week.  For now, will continue IV antibiotics and await cultures to direct further treatment.  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  Full code    Disposition  Patient appears to be doing better.  Await the urine culture results to determine and direct antibiotic choice upon discharge.  PT OT to evaluate patient to see if he needs home health or SNF    ------------------    36 minutes were spent on the patient of which more than 50% was spent in coordination of care and counseling (time spent with patient/family face to face, physical exam, reviewing laboratory and imaging investigations, speaking with physicians and nursing staff involved in this patient's care)    ------------------  Physical exam:  General:  Alert, NAD, pleasant.  Cooperative  HEENT: Sclera anicteric, PERRL, OM moist, throat clear.  Chest:  CTA B, no wheeze, no rales, no rhonchi.  Good air movement.  CV: RRR, S1/S2 were normal, no murmur  Abdomen: NTND, soft, NABS, no masses were noted.  No rebound, no guarding.  Extremities: 1+ edema.    Subjective:      Patient says he feels a bit better.  We discussed that he will have to wait for the cultures to come back.  Patient notes he feels well otherwise.  No chest pain, no shortness of breath, no fever chills at this time we discussed urologist plan to likely take him to procedure late next week or the following week but that likely would send him to either home or rehab in the interim once we get the culture results.      Objective:     Visit Vitals  BP 143/65 (BP 1 Location: Left arm, BP Patient Position: Supine)   Pulse 94   Temp (!) 102.7 ??F (39.3 ??C)   Resp 18   Ht 5' 8" (1.727 m)   Wt 63.4 kg (139 lb 12.4 oz)   SpO2 96%   BMI 21.25 kg/m??      O2 Device: Room air    Temp (24hrs), Avg:101.3 ??F (38.5 ??C), Min:97.9 ??F (36.6 ??C), Max:104.6 ??F (40.3 ??C)    05/21 0701 - 05/21 1900  In: 240 [P.O.:240]  Out: 900 [Urine:900]   05/19 1901 - 05/21 0700  In: -   Out: 1450 [FXOVA:9191]      Data Review:       Recent Days:  Recent Labs     07/16/18  0413 07/15/18  1405   WBC 8.2 7.7   HGB 8.0* 8.7*   HCT 25.7* 28.2*   PLT 157 177  Recent Labs     07/16/18  0413 07/15/18  1405   NA 139 137   K 4.3 4.5   CL 111* 108*   CO2 19* 21   GLU 112* 125*   BUN 29* 36*   CREA 1.4* 1.8*   CA 8.4* 9.0   ALB 2.4* 2.7*   TBILI 0.8 0.7   SGOT 21 35   ALT 30 40     No results for input(s): PH, PCO2, PO2, HCO3, FIO2 in the last 72 hours.    24 Hour Results:  Recent Results (from the past 24 hour(s))   LACTIC ACID    Collection Time: 07/15/18  6:34 PM   Result Value Ref Range    Lactic Acid 2.2 (H) 0.4 - 2.0 mmol/L   LACTIC ACID    Collection Time: 07/15/18 11:05 PM   Result Value Ref Range    Lactic Acid 1.3 0.4 - 2.0 mmol/L   LACTIC ACID    Collection Time: 07/16/18  4:13 AM   Result Value Ref Range    Lactic Acid 1.0 0.4 - 2.0 mmol/L   TSH 3RD GENERATION    Collection Time: 07/16/18  4:13 AM   Result Value Ref Range    TSH 0.809 0.358 - 3.740 uIU/mL   CBC W/O DIFF    Collection Time: 07/16/18  4:13 AM   Result Value Ref Range     WBC 8.2 4.0 - 11.0 1000/mm3    RBC 2.95 (L) 3.80 - 5.70 M/uL    HGB 8.0 (L) 12.4 - 17.2 gm/dl    HCT 25.7 (L) 37.0 - 50.0 %    MCV 87.1 80.0 - 98.0 fL    MCH 27.1 23.0 - 34.6 pg    MCHC 31.1 30.0 - 36.0 gm/dl    PLATELET 157 140 - 450 1000/mm3    MPV 11.5 (H) 6.0 - 10.0 fL    RDW-SD 60.6 (H) 35.1 - 08.6     METABOLIC PANEL, COMPREHENSIVE    Collection Time: 07/16/18  4:13 AM   Result Value Ref Range    Sodium 139 136 - 145 mEq/L    Potassium 4.3 3.5 - 5.1 mEq/L    Chloride 111 (H) 98 - 107 mEq/L    CO2 19 (L) 21 - 32 mEq/L    Glucose 112 (H) 74 - 106 mg/dl    BUN 29 (H) 7 - 25 mg/dl    Creatinine 1.4 (H) 0.6 - 1.3 mg/dl    GFR est AA >60      GFR est non-AA 53      Calcium 8.4 (L) 8.5 - 10.1 mg/dl    AST (SGOT) 21 15 - 37 U/L    ALT (SGPT) 30 12 - 78 U/L    Alk. phosphatase 159 (H) 45 - 117 U/L    Bilirubin, total 0.8 0.2 - 1.0 mg/dl    Protein, total 7.0 6.4 - 8.2 gm/dl    Albumin 2.4 (L) 3.4 - 5.0 gm/dl    Anion gap 8 5 - 15 mmol/L       Xr Chest Sngl V    Result Date: 07/15/2018  AP chest. Comparison 05/19/2018 INDICATION: Sepsis.     IMPRESSION: Midline sternotomy. Cardiac silhouette normal. Lungs clear of any acute process.     Xr Abd (kub)    Result Date: 07/15/2018  Supine abdomen. INDICATION: Fever. FINDINGS: Left-sided double-J catheter in place. Multiple calcifications overlying the left kidney. Scattered arthritis lumbar spine. Previous lower lumbar  spine surgery with pedicle screws L4 and L5 level. Penile prostheses identified. Bowel gas pattern unremarkable.     IMPRESSION: Findings as described above     Ct Abd Pelv Wo Cont    Result Date: 06/22/2018   CT Abdomen and Pelvis without Indication: Left hydronephrosis. Evaluate for obstructing stone or lesion. Comparison: Ultrasound 06/22/2018. CT 05/21/2018. TECHNIQUE: CT of the abdomen and pelvis WITHOUT intravenous contrast. Coronal and sagittal reformations were obtained.    All CT exams at this facility use one or more dose reduction techniques including automatic exposure control, mA/kV adjustment per patient's size, or iterative reconstruction technique. DICOM format imaged data is available to non-affiliated external healthcare facilities or entities on a secure, media free, reciprocal searchable basis with patient authorization for 12 months following the date of the study. DISCUSSION: ABSENCE OF INTRAVENOUS CONTRAST DECREASES SENSITIVITY FOR DETECTION OF FOCAL LESIONS AND VASCULAR PATHOLOGY. LOWER THORAX: Coronary artery calcifications. HEPATOBILIARY: Multiple subcentimeter foci of hypoattenuation, too small to characterize but unchanged. Cholecystectomy. Left intrahepatic pneumobilia, unchanged. SPLEEN: No splenomegaly. PANCREAS: Postoperative changes from prior Whipple procedure. No mass or duct dilatation in the residual pancreatic tail. ADRENALS: No adrenal nodules. KIDNEYS/URETERS: Multiple bilateral renal cysts. Multiple small (2 to 4 mm) nonobstructing bilateral renal calculi. There is a 10 x 9 mm nonobstructing calculus in the lower pole of the left kidney. Moderate left hydronephrosis and hydroureter. There is a 8 x 7 mm obstructing calculus in the proximal left ureter. No right hydronephrosis. No suspicious renal lesions. PELVIC ORGANS/BLADDER: Enlarged prostate. Penile implant. The bladder is unremarkable. PERITONEUM / RETROPERITONEUM: No free air or fluid. LYMPH NODES: No lymphadenopathy. VESSELS: Extensive atherosclerotic vascular calcifications. Infrarenal abdominal aorta ectasia. GI TRACT: No distention or wall thickening. Diverticulosis. Normal appendix. BONES AND SOFT TISSUES: Posterior lumbar  fixation at L4-5. Mild retrolisthesis at L2-3 and L3-4. Moderate to advanced disc height loss at L2-3. Mild spondylosis of the remaining lumbar spine.     IMPRESSION: 1.  Obstructing 8 mm calculus in the left proximal ureter causing moderate left hydronephrosis. 2.  Multiple additional nonobstructing bilateral renal calculi. 3.  Multiple bilateral renal cysts. 4.  Diverticulosis. 5.  Postoperative changes from prior Whipple procedure. 6.  Multiple hypoattenuating liver lesions, unchanged.     [place] Retroperitoneum Comp    Result Date: 07/15/2018  Examination: Ultrasound retroperitoneum complete INDICATION: Renal ultrasound for stent. FINDINGS: Exam limited due to patient body habitus and motion. RRT called on patient. The right kidney roughly measures 10.5 x 7.0 x 4.3 cm there are multiple cysts are identified within the right kidney, measuring up to 1.9 cm within the midpole. No hydronephrosis. Renal parenchyma appears mildly echogenic. No focal mass. The left kidney roughly measures 4.4 x 8.4 x 6.2 cm. Multiple cysts are identified within the left kidney, measuring up to 2.4 x 2.9 cm within the midpole. There is mild hydronephrosis with the AP dimension of the pelvis measuring 2.1 cm. Renal parenchyma slightly echogenic. No mass. Prevoid urinary bladder volume measures 568 mL. Post void volume measures 408 mL. Stent is identified within the urinary bladder and the left kidney. Prostate volume measures 31.18 mL per     IMPRESSION: 1. Both kidneys are slightly echogenic, concerning for medical renal disease. 2. Mild left-sided hydronephrosis with a stent which appears in customary position. 3. Small bilateral renal cysts.     [place] Retroperitoneum Comp    Result Date: 06/22/2018   INDICATION: AKI with history of renal stones;    COMPARISON: CT 05/21/2018 and MRI  01/19/2018. TECHNIQUE: Renal Ultrasound FINDINGS: Right Kidney: Measures 9.6 x 4.3 x 6.4 cm. There is good corticomedullary differentiation. No hydronephrosis. Multiple echogenic foci in the renal pelvis, consistent with calculi, the largest measuring 0.4 cm. Multiple simple cysts, the largest cyst in the mid kidney measuring 0.4 x 0.5 x 0.4 cm. No solid mass. Left Kidney: Measures 11.0 x 4.4 x 6.6 cm. There is good corticomedullary differentiation. Moderate hydronephrosis and proximal hydroureter. Multiple echogenic foci in the renal pelvis, consistent with calculi, the largest measuring 0.6 cm. Multiple simple cysts, the largest cyst in the mid kidney measuring 1.8 x 1.7 x 1.9 cm. No solid mass. Bladder: Unremarkable.     IMPRESSION: 1.  Left hydronephrosis. 2.  Multiple bilateral nonobstructing renal calculi. 3.  Multiple bilateral simple cysts.     Xr Retro Urethrocystography    Result Date: 06/22/2018  INDICATION: L retrograde / L stent placement     PROCEDURE: XR RETRO URETHROCYSTOGRAPHY COMPARISON: None FLUOROSCOPIC TIME: 7.5 seconds. FINDINGS: Left retrograde, left stent placement.     IMPRESSION: Left retrograde, left stent placement.       Microbiology:  All Micro Results     Procedure Component Value Units Date/Time    CULTURE, URINE [[id number]]  (Abnormal) Collected:  07/15/18 1442    Order Status:  Completed Specimen:  Urine from Clean catch Updated:  07/16/18 0919     Isolate --        >100,000 CFU/mL  Gram Negative Bacilli Isolated  Identification And Susceptibility To Follow      CULTURE, BLOOD [[id number]] Collected:  07/15/18 1405    Order Status:  Completed Specimen:  Peripheral blood Updated:  07/16/18 0704     Blood Culture Result       Culture In Progress, Daily Updates To Follow          CULTURE, BLOOD [[id number]] Collected:  07/15/18 1442     Order Status:  Completed Specimen:  Peripheral blood Updated:  07/16/18 0704     Blood Culture Result       Culture In Progress, Daily Updates To Follow          COVID-19 (INPATIENT TESTING) [[id number]] Collected:  07/15/18 1405    Order Status:  Completed Specimen:  NASOPHARYNGEAL SWAB Updated:  07/15/18 1537     COVID-19 NEGATIVE        Comment: Negative results do not preclude SARS-CoV-2 infection and should not be used as the sole basis  for treatment or other patient management decisions. Negative results must be combined with  clinical observations, patient history, and epidemiological information.  Testing with the Xpert Xpress SARS-CoV-2 test is intended for use by trained operators who are  proficient in performing tests using either GeneXpert Dx, GeneXpert Infinity and/or [employer]. The Xpert Xpress SARS-CoV-2 test is only for use under the Food and Drug  Administration's Emergency Use Authorization.                Cardiology:  Results for orders placed or performed during the hospital encounter of 06/22/18   EKG, 12 LEAD, INITIAL   Result Value Ref Range    Ventricular Rate 74 BPM    Atrial Rate 74 BPM    P-R Interval 178 ms    QRS Duration 98 ms    Q-T Interval 372 ms    QTC Calculation (Bezet) 412 ms    Calculated P Axis 4 degrees    Calculated R Axis -33 degrees    Calculated T  Axis 50 degrees    Diagnosis       Normal sinus rhythm  Left axis deviation  Inferior infarct (cited on or before 19-Jan-2018)  Abnormal ECG  When compared with ECG of 19-Jan-2018 16:08,  No significant change was found  Confirmed by Macatangay-Geronilla M.D., [place] [phone]) on 07/22/18 1:04:46   PM           Problem List:  Problem List as of 07/16/2018 Date Reviewed: 22-Jul-2018          Codes Class Noted - Resolved    UTI (urinary tract infection) ICD-10-CM: N39.0  ICD-9-CM: 599.0  07/15/2018 - Present        AKI (acute kidney injury) ([place]) ICD-10-CM: N17.9  ICD-9-CM: 584.9  Jul 22, 2018 - Present         Sepsis ([place]) ICD-10-CM: A41.9  ICD-9-CM: 038.9, 995.91  01/17/2018 - Present              Medications reviewed  Current Facility-Administered Medications   Medication Dose Route Frequency   ??? dextran 70-hypromellose (ARTIFICIAL TEARS) ophthalmic solution 1 Drop  1 Drop Both Eyes PRN   ??? ondansetron (ZOFRAN) injection 4 mg  4 mg IntraVENous Q4H PRN   ??? albuterol-ipratropium (DUO-NEB) 2.5 MG-0.5 MG/3 ML  3 mL Nebulization Q4H PRN   ??? lactulose (CHRONULAC) 10 gram/15 mL solution 30 mL  20 g Oral DAILY PRN   ??? polyethylene glycol (MIRALAX) packet 17 g  17 g Oral DAILY PRN   ??? zolpidem (AMBIEN) tablet 5 mg  5 mg Oral QHS PRN   ??? allopurinoL (ZYLOPRIM) tablet 300 mg  300 mg Oral DAILY   ??? amLODIPine (NORVASC) tablet 5 mg  5 mg Oral DAILY   ??? aspirin chewable tablet 81 mg  81 mg Oral DAILY   ??? gabapentin (NEURONTIN) capsule 600 mg  600 mg Oral BID   ??? lipase-protease-amylase (CREON 24,000) capsule 2 Cap  2 Cap Oral QID WITH MEALS   ??? metoprolol succinate (TOPROL-XL) XL tablet 25 mg  25 mg Oral DAILY   ??? tamsulosin (FLOMAX) capsule 0.4 mg  0.4 mg Oral DAILY   ??? sodium chloride (NS) flush 5-10 mL  5-10 mL IntraVENous Q8H   ??? sodium chloride (NS) flush 5-10 mL  5-10 mL IntraVENous PRN   ??? naloxone (NARCAN) injection 0.1 mg  0.1 mg IntraVENous PRN   ??? cefTRIAXone (ROCEPHIN) 1 g in 0.9% sodium chloride (MBP/ADV) 50 mL MBP  1 g IntraVENous Q24H   ??? acetaminophen (TYLENOL) tablet 650 mg  650 mg Oral Q6H PRN   ??? dextrose 5 % - 0.45% NaCl infusion  100 mL/hr IntraVENous CONTINUOUS         Kristen Loader, MD

## 2018-07-16 NOTE — Progress Notes (Signed)
Urology Progress Note        Assessment/Plan:     Patient Active Problem List   Diagnosis Code   ??? Sepsis ([place]) A41.9   ??? AKI (acute kidney injury) ([place]) N17.9   ??? UTI (urinary tract infection) N39.0     Tommy Brown is a 73 year old male    Assessment:  - Complicated UTI ??- failed outpatient treatment of Levoquin    Urine culture 07/15/18: >100 K Gram Negative Bacilli Isolated   Blood Culture 07/15/18: pending   Note: Urine culture 06/22/18 30 K Escherichia coli with resistant pattern    - Left 45mm proximal ureteral stone with hydroureteronephrosis??and 7mm LLP stone  ????????????????????????- s/p cystoscopy, left JJ stent on 06/22/2018 by Dr. Claiborne Billings   - AKI with baseline creatinine 1.5  ????????????????????????- Peak creatinine 1.8 (5/20)  ????????????????????????- Current creatinine 1.4   - Fevers (up to 104.5) and rigors at home - temperature upon admission 98.5              - WBC upon admission 7.7 >  8.2 today  - Made NPO yesterday - Per Dr. Loleta Books "  Renal and bladder ultrasound reviewed; mild left  hydronephrosis may just be intrarenal reflux from stent, post void bladder ultrasound did  show elevated residual of 400 mL" - foley requested to be placed and noted to be  refused by the patient and PVR's being monitored and the patient states he is voiding  well    07/16/18 - Pt voided 200 mL/ Bladder scan 175 mL  ??  - Hx of nephrolithiasis s/p URS with LL in 02/2017  - Hx of ED with IPP  - Hx of Pancreatic cancer s/p Whipple in 2006, chemotherapy and radiation, chronic         Plan:    Continue Flomax  Continue IV Rocephin  Continue to monitor PVR's please  Continue on po antibiotic culture specific until stent/stone addressed     Note: Message sent to [facility] to tentatively add on for Tuesday 5/26 for cystoscopy, ureteroscopy with laser lithotripsy, left JJ stent exchange vs removal as patient would like definitive stone treatment and stent removal during this visit. This can only be done if negative urine culture and patient stable - culture with >100 K Gram Negative Bacilli Isolated and will need to adjust this timing but feel he should be on antibiotics prior to surgery - with Dr. Oleta Mouse    Follow up arranged? YES  Note sent to [facility]   [facility].  [place], [place]         Subjective:     Daily Progress Note: 07/16/2018 11:48 AM    Tommy Brown is doing well and reports he did not want a foley and is voiding without difficulty. No other complaints voiced.    Objective:     Visit Vitals  BP 112/58 (BP 1 Location: Left arm, BP Patient Position: Supine)   Pulse 73   Temp 99.3 ??F (37.4 ??C)   Resp 18   Ht 5\' 8"  (1.727 m)   Wt 63.4 kg (139 lb 12.4 oz)   SpO2 94%   BMI 21.25 kg/m??        Temp (24hrs), Avg:100.7 ??F (38.2 ??C), Min:97.9 ??F (36.6 ??C), Max:104.6 ??F (40.3 ??C)      Intake and Output:  05/19 1901 - 05/21 0700  In: -   Out:  1450 [Urine:1450]  No intake/output data recorded.    PHYSICAL EXAMINATION:   Visit Vitals  BP 112/58 (BP 1 Location: Left arm, BP Patient Position: Supine)   Pulse 73   Temp 99.3 ??F (37.4 ??C)   Resp 18   Ht 5\' 8"  (1.727 m)   Wt 63.4 kg (139 lb 12.4 oz)   SpO2 94%   BMI 21.25 kg/m??     Constitutional: Well developed, well nourished male.  No acute distress.    HEENT: Normocephalic, Atraumatic, EOM's intact   CV:  no edema  Respiratory: No respiratory distress or difficulties breathing   Abdomen:  soft and non tender   GU Male:  No longer reporting  Left CVA tenderness  Skin: No evidence of jaundice.  Normal color  Neuro/Psych:  Alert and oriented. Affect appropriate.   MSK: normal ROM   ??  ??  REVIEW OF LABS AND IMAGING:   ??   Renal Ultrasound 07/15/18:  IMPRESSION:  1. Both kidneys are slightly echogenic, concerning for medical renal disease.  2. Mild left-sided hydronephrosis with a stent which appears in customary  position.  3. Small bilateral renal cysts.      KUB 07/15/2018  FINDINGS: Left-sided double-J catheter in place. Multiple calcifications  overlying the left kidney. Scattered arthritis lumbar spine. Previous lower  lumbar spine surgery with pedicle screws L4 and L5 level. Penile prostheses  identified. Bowel gas pattern unremarkable.  ??    Labs:     Labs: Results:   Chemistry    Recent Labs     07/16/18  0413 07/15/18  1405   GLU 112* 125*   NA 139 137   K 4.3 4.5   CL 111* 108*   CO2 19* 21   BUN 29* 36*   CREA 1.4* 1.8*   CA 8.4* 9.0   AGAP 8 8   AP 159* 190*   TP 7.0 8.0   ALB 2.4* 2.7*      CBC w/Diff Recent Labs     07/16/18  0413 07/15/18  1405   WBC 8.2 7.7   RBC 2.95* 3.16*   HGB 8.0* 8.7*   HCT 25.7* 28.2*   PLT 157 177   GRANS  --  81.8*   LYMPH  --  8.5*   EOS  --  0.7      Cultures No results for input(s): CULT in the last 72 hours.  All Micro Results     Procedure Component Value Units Date/Time    CULTURE, URINE [[id number]]  (Abnormal) Collected:  07/15/18 1442    Order Status:  Completed Specimen:  Urine from Clean catch Updated:  07/16/18 0919     Isolate --        >100,000 CFU/mL  Gram Negative Bacilli Isolated  Identification And Susceptibility To Follow      CULTURE, BLOOD [[id number]] Collected:  07/15/18 1405    Order Status:  Completed Specimen:  Peripheral blood Updated:  07/16/18 0704     Blood Culture Result       Culture In Progress, Daily Updates To Follow          CULTURE, BLOOD [[id number]] Collected:  07/15/18 1442    Order Status:  Completed Specimen:  Peripheral blood Updated:  07/16/18 0704     Blood Culture Result       Culture In Progress, Daily Updates To Follow          COVID-19 (INPATIENT TESTING) [[id number]] Collected:  07/15/18 1405  Order Status:  Completed Specimen:  NASOPHARYNGEAL SWAB Updated:  07/15/18 1537     COVID-19 NEGATIVE        Comment: Negative results do not preclude SARS-CoV-2 infection and should not be used as the sole basis  for treatment or other patient management decisions. Negative results must be combined with  clinical observations, patient history, and epidemiological information.  Testing with the Xpert Xpress SARS-CoV-2 test is intended for use by trained operators who are  proficient in performing tests using either GeneXpert Dx, GeneXpert Infinity and/or [employer]. The Xpert Xpress SARS-CoV-2 test is only for use under the Food and Drug  Administration's Emergency Use Authorization.                 Urinalysis Color   Date Value Ref Range Status   07/15/2018 Amber   Final     Appearance   Date Value Ref Range Status   07/15/2018 Turbid   Final     Specific gravity   Date Value Ref Range Status   07/15/2018 1.020 1.005 - 1.030   Final     pH (UA)   Date Value Ref Range Status   07/15/2018 5.5 5 - 9   Final     Protein   Date Value Ref Range Status   07/15/2018 100 (A) NEGATIVE,Negative mg/dl Final     Ketone   Date Value Ref Range Status   07/15/2018 Negative NEGATIVE,Negative mg/dl Final     Bilirubin   Date Value Ref Range Status   07/15/2018 Negative NEGATIVE,Negative   Final     Blood   Date Value Ref Range Status   07/15/2018 Large (A) NEGATIVE,Negative   Final     Comment:     Operator: [id number]     Urobilinogen   Date Value Ref Range Status   07/15/2018 0.2 0.0 - 1.0 EU/dl Final     Nitrites   Date Value Ref Range Status   07/15/2018 Negative NEGATIVE,Negative   Final     Leukocyte Esterase   Date Value Ref Range Status   07/15/2018 Moderate (A) NEGATIVE,Negative   Final     Potassium   Date Value Ref Range Status   07/16/2018 4.3 3.5 - 5.1 mEq/L Final     Creatinine   Date Value Ref Range Status   07/16/2018 1.4 (H) 0.6 - 1.3 mg/dl Final     BUN   Date Value Ref Range Status    07/16/2018 29 (H) 7 - 25 mg/dl Final     Prostate Specific Ag   Date Value Ref Range Status   04/30/2018 0.6 0.0 - 4.0 ng/mL Final      PSA No results for input(s): PSA in the last 72 hours.   Coagulation No results found for: PTP, INR, APTT, INREXT

## 2018-07-16 NOTE — Progress Notes (Signed)
Pt voided 200 mL  Bladder scan 129 mL

## 2018-07-16 NOTE — Other (Signed)
Bedside and Verbal shift change report given to [place] Rogers(oncoming nurse) by Basilio Cairo (offgoing nurse). Report included the following information SBAR, Kardex and Recent Results.

## 2018-07-16 NOTE — Progress Notes (Signed)
Discussed with Dr. Oleta Mouse unlikely patient will be ready for procedure by Tuesday.   Will need to look for time later next week or even the following week for procedure.   Will need negative repeat culture.     Donia Pounds, PA-C  [facility]   Pager Monday-Friday 8am-5pm [phone]  If after hours please call [phone]

## 2018-07-16 NOTE — Progress Notes (Signed)
Pt voided 200 mL  Bladder scan 175 mL

## 2018-07-16 NOTE — Progress Notes (Signed)
Progress  Notes by Constance Goltz, [place] at 07/16/18 1148                Author: Constance Goltz, PA  Service: Urology  Author Type: Physician Assistant       Filed: 07/16/18 1217  Date of Service: 07/16/18 1148  Status: Signed           Editor: Constance Goltz, PA (Physician Assistant)  Cosigner: Shirlee Latch, MD at 07/16/18 1718                          Urology Progress Note              Assessment/Plan:          Patient Active Problem List        Diagnosis  Code         ?  Sepsis ([place])  A41.9     ?  AKI (acute kidney injury) ([place])  N17.9         ?  UTI (urinary tract infection)  N39.0        Tommy Brown is a 73 year old male      Assessment:   - Complicated UTI ??- failed outpatient treatment of Levoquin     Urine culture 07/15/18: >100 K Gram Negative Bacilli Isolated    Blood Culture 07/15/18: pending    Note: Urine culture 06/22/18 30 K Escherichia coli with resistant pattern      - Left 39mm proximal ureteral stone with hydroureteronephrosis??and 62mm LLP stone   ????????????????????????- s/p cystoscopy, left JJ stent on 06/22/2018 by Dr. Claiborne Billings    - AKI with baseline creatinine 1.5   ????????????????????????- Peak creatinine 1.8 (5/20)   ????????????????????????- Current creatinine 1.4    - Fevers (up to 104.5) and rigors at home - temperature upon admission 98.5               - WBC upon admission 7.7 >  8.2 today   - Made NPO yesterday - Per Dr. Loleta Books "  Renal and bladder ultrasound reviewed; mild left  hydronephrosis may just be intrarenal reflux from stent, post void bladder ultrasound did  show elevated residual of 400 mL" - foley requested to be placed  and noted to be  refused by the patient and PVR's being monitored and the patient states he is voiding  well     07/16/18 - Pt voided 200 mL/ Bladder scan 175 mL   ??   - Hx of nephrolithiasis s/p URS with LL in 02/2017   - Hx of ED with IPP   - Hx of Pancreatic cancer s/p [facility] in 2006, chemotherapy and radiation, chronic             Plan:     Continue Flomax   Continue IV Rocephin    Continue to monitor PVR's please   Continue on po antibiotic culture specific until stent/stone addressed      Note: Message sent to [facility] to tentatively add on for Tuesday 5/26 for cystoscopy, ureteroscopy with laser lithotripsy, left JJ stent exchange vs removal as patient would like definitive stone treatment and stent removal during this visit. This  can only be done if negative urine culture and patient stable - culture with >100 K Gram Negative Bacilli Isolated and will need to adjust this timing but feel he should be on antibiotics prior to surgery - with Dr. Oleta Mouse  Follow up arranged? YES  Note sent to [employer] [facility]    [facility].   [place], [place]               Subjective:        Daily Progress Note: 07/16/2018 11:48 AM      Tommy Brown is doing well and reports he did not want a foley and is voiding without difficulty. No other complaints voiced.        Objective:        Visit Vitals      BP  112/58 (BP 1 Location: Left arm, BP Patient Position: Supine)     Pulse  73     Temp  99.3 ??F (37.4 ??C)     Resp  18     Ht  5\' 8"  (1.727 m)     Wt  63.4 kg (139 lb 12.4 oz)     SpO2  94%        BMI  21.25 kg/m??            Temp (24hrs), Avg:100.7 ??F (38.2 ??C), Min:97.9 ??F (36.6 ??C), Max:104.6 ??F (40.3 ??C)         Intake and Output:   05/19 1901 - 05/21 0700   In: -    Out: 1610 [Urine:1450]   No intake/output data recorded.      PHYSICAL EXAMINATION:    Visit Vitals      BP  112/58 (BP 1 Location: Left arm, BP Patient Position: Supine)     Pulse  73     Temp  99.3 ??F (37.4 ??C)     Resp  18     Ht  5\' 8"  (1.727 m)     Wt  63.4 kg (139 lb 12.4 oz)     SpO2  94%        BMI  21.25 kg/m??        Constitutional: Well developed, well nourished male.  No acute distress.     HEENT: Normocephalic, Atraumatic, EOM's intact    CV:  no edema   Respiratory: No respiratory distress or difficulties breathing    Abdomen:  soft and non tender    GU Male:   No longer reporting   Left CVA tenderness   Skin: No evidence of jaundice.  Normal color   Neuro/Psych:  Alert and oriented. Affect appropriate.    MSK: normal ROM    ??   ??   REVIEW OF LABS AND IMAGING:    ??   Renal Ultrasound 07/15/18:   IMPRESSION:   1. Both kidneys are slightly echogenic, concerning for medical renal disease.   2. Mild left-sided hydronephrosis with a stent which appears in customary   position.   3. Small bilateral renal cysts.         KUB 07/15/2018   FINDINGS: Left-sided double-J catheter in place. Multiple calcifications   overlying the left kidney. Scattered arthritis lumbar spine. Previous lower   lumbar spine surgery with pedicle screws L4 and L5 level. Penile prostheses   identified. Bowel gas pattern unremarkable.   ??      Labs:          Labs:  Results:        Chemistry      Recent Labs         07/16/18   0413  07/15/18  1405      GLU  112*  125*      NA  139  137      K  4.3  4.5      CL  111*  108*      CO2  19*  21      BUN  29*  36*      CREA  1.4*  1.8*      CA  8.4*  9.0      AGAP  8  8      AP  159*  190*      TP  7.0  8.0      ALB  2.4*  2.7*                 CBC w/Diff  Recent Labs         07/16/18   0413  07/15/18   1405      WBC  8.2  7.7      RBC  2.95*  3.16*      HGB  8.0*  8.7*      HCT  25.7*  28.2*      PLT  157  177      GRANS   --   81.8*      LYMPH   --   8.5*      EOS   --   0.7              Cultures  No results for input(s): CULT in the last 72 hours.   All Micro Results         Procedure  Component  Value  Units  Date/Time        CULTURE, URINE [[id number]]  (Abnormal)  Collected:  07/15/18 1442        Order Status:  Completed  Specimen:  Urine from Clean catch  Updated:  07/16/18 0919          Isolate  --                 >100,000 CFU/mL   Gram Negative Bacilli Isolated   Identification And Susceptibility To Follow             CULTURE, BLOOD [[id number]]  Collected:  07/15/18 1405        Order Status:  Completed  Specimen:  Peripheral blood  Updated:  07/16/18 0704           Blood Culture Result              Culture In Progress, Daily Updates To Follow                   CULTURE, BLOOD [[id number]]  Collected:  07/15/18 1442        Order Status:  Completed  Specimen:  Peripheral blood  Updated:  07/16/18 0704          Blood Culture Result              Culture In Progress, Daily Updates To Follow                   COVID-19 (INPATIENT TESTING) [[id number]]  Collected:  07/15/18 1405        Order Status:  Completed  Specimen:  NASOPHARYNGEAL SWAB  Updated:  07/15/18 1537          COVID-19  NEGATIVE  Comment:  Negative results do not preclude SARS-CoV-2 infection and should not be used as the sole basis   for treatment or other patient management decisions. Negative results must be combined with   clinical observations, patient history, and epidemiological information.   Testing with the Xpert Xpress SARS-CoV-2 test is intended for use by trained operators who are   proficient in performing tests using either GeneXpert Dx, GeneXpert Infinity and/or [employer]. The Xpert Xpress SARS-CoV-2 test is only for use under the Food and Drug   Administration's Emergency Use Authorization.                                Urinalysis  Color      Date  Value  Ref Range  Status      07/15/2018  Amber     Final         Appearance      Date  Value  Ref Range  Status      07/15/2018  Turbid     Final         Specific gravity      Date  Value  Ref Range  Status      07/15/2018  1.020  1.005 - 1.030    Final         pH (UA)      Date  Value  Ref Range  Status      07/15/2018  5.5  5 - 9    Final         Protein      Date  Value  Ref Range  Status      07/15/2018  100 (A)  NEGATIVE,Negative mg/dl  Final         Ketone      Date  Value  Ref Range  Status      07/15/2018  Negative  NEGATIVE,Negative mg/dl  Final         Bilirubin      Date  Value  Ref Range  Status      07/15/2018  Negative  NEGATIVE,Negative    Final         Blood      Date  Value  Ref Range  Status       07/15/2018  Large (A)  NEGATIVE,Negative    Final          Comment:          Operator: [id number]         Urobilinogen      Date  Value  Ref Range  Status      07/15/2018  0.2  0.0 - 1.0 EU/dl  Final         Nitrites      Date  Value  Ref Range  Status      07/15/2018  Negative  NEGATIVE,Negative    Final         Leukocyte Esterase      Date  Value  Ref Range  Status      07/15/2018  Moderate (A)  NEGATIVE,Negative    Final         Potassium      Date  Value  Ref Range  Status      07/16/2018  4.3  3.5 - 5.1 mEq/L  Final         Creatinine      Date  Value  Ref Range  Status      07/16/2018  1.4 (H)  0.6 - 1.3 mg/dl  Final         BUN      Date  Value  Ref Range  Status      07/16/2018  29 (H)  7 - 25 mg/dl  Final         Prostate Specific Ag      Date  Value  Ref Range  Status      04/30/2018  0.6  0.0 - 4.0 ng/mL  Final              PSA  No results for input(s): PSA in the last 72 hours.        Coagulation  No results found for: PTP, INR, APTT, INREXT

## 2018-07-16 NOTE — Progress Notes (Signed)
Discussed with Dr. Oleta Mouse unlikely patient will be ready for procedure by Tuesday.   Will need to look for time later next week or even the following week for procedure.   Will need negative repeat culture.     Donia Pounds, PA-C  [facility]   Pager Monday-Friday 8am-5pm [phone]  If after hours please call [phone]

## 2018-07-16 NOTE — Progress Notes (Signed)
0930-  Patient complained about burning eyes this AM. The patient allowed me to rinse his eyes with a sterile saline flush. He stated that he takes over the counter eye drops every morning because of the burning and itching of the eyes. Systane Ultra lubricating drops. This was not listed on the patients home medication list.  Medication list has been updated and completed. Dr. Nira Retort paged to request eye drops for the patient.    PLAN: Continue to monitor patient throughout shift.    NEW ORDER:  [id number]- Physician has ordered lubricating eye drops for the patient.    1700-  Notified that the patients temperature was 102.0. Tylenol given as instructed. Patient is being treated with antibiotics as well.    PLAN: Continue to monitor the patient throughout shift.    1750- Physician has been made aware of the patients fever and the administration of tylenol twice today.  No new orders obtained.    1830-  I&O's completed. IV Pump cleared for next shift.

## 2018-07-16 NOTE — Progress Notes (Signed)
Progress  Notes by Kristen Loader, MD at 07/16/18 1735                Author: Kristen Loader, MD  Service: Hospitalist  Author Type: Physician       Filed: 07/17/18 0948  Date of Service: 07/16/18 1735  Status: Signed          Editor: Kristen Loader, MD (Physician)                                    Hospitalist Progress Note   Kristen Loader, MD   [facility]      Daily Progress Note: 07/16/2018        Assessment/Plan:        Complicated UTI   L Ureteral stent s/p lithotripsy 05/2018   CAD s/p CABG   CKD3   HTN   Gout   Pancreatic cancer s/p Whipple/XRT.   BPH   HLD   Metabolic acidosis      Plan:      Fever appears to have subsided   Urology input noted and appreciated   It appears that GU will be unable to perform procedure until late next week or the following week.   For now, will continue IV antibiotics and await cultures to direct further treatment.   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  Full code      Disposition   Patient appears to be doing better.  Await the urine culture results to determine and direct antibiotic choice upon discharge.   PT OT to evaluate patient to see if he needs home health or SNF      ------------------      36 minutes were spent on the patient of which more than 50% was spent in coordination of care and counseling (time spent with patient/family face to face, physical exam, reviewing laboratory and imaging investigations, speaking with physicians and nursing  staff involved in this patient's care)      ------------------   Physical exam:   General:   Alert, NAD, pleasant.  Cooperative   HEENT: Sclera anicteric, PERRL,  OM moist, throat clear.   Chest:   CTA B, no wheeze, no rales, no rhonchi.  Good air movement.   CV: RRR, S1/S2 were normal, no murmur   Abdomen: NTND, soft, NABS, no masses were noted.  No rebound, no guarding.   Extremities: 1+  edema.        Subjective:        Patient says he feels a bit better.  We discussed that he will have to wait for the cultures to come back.  Patient notes he feels well otherwise.  No chest pain, no shortness of breath, no fever chills at this time we discussed urologist plan to likely  take him to procedure late next week or the following week but that likely would send him to either home or rehab in the interim once we get the  culture results.           Objective:        Visit Vitals      BP  143/65 (BP 1 Location: Left arm, BP Patient Position: Supine)     Pulse  94     Temp  (!) 102.7 ??F (39.3 ??C)     Resp  18     Ht  5' 8"  (1.727 m)     Wt  63.4 kg (139 lb 12.4 oz)     SpO2  96%        BMI  21.25 kg/m??         O2 Device: Room air      Temp (24hrs), Avg:101.3 ??F (38.5 ??C), Min:97.9 ??F (36.6 ??C), Max:104.6 ??F (40.3 ??C)     05/21 0701 - 05/21 1900   In: 240 [P.O.:240]   Out: 900 [Urine:900]   05/19 1901 - 05/21 0700   In: -    Out: 0865 [Urine:1450]           Data Review:          Recent Days:     Recent Labs            07/16/18   0413  07/15/18   1405     WBC  8.2  7.7     HGB  8.0*  8.7*     HCT  25.7*  28.2*         PLT  157  177          Recent Labs            07/16/18   0413  07/15/18   1405     NA  139  137     K  4.3  4.5     CL  111*  108*     CO2  19*  21     GLU  112*  125*     BUN  29*  36*     CREA  1.4*  1.8*     CA  8.4*  9.0     ALB  2.4*  2.7*     TBILI  0.8  0.7     SGOT  21  35         ALT  30  40        No results for input(s): PH, PCO2, PO2, HCO3, FIO2 in the last 72 hours.      24 Hour Results:     Recent Results (from the past 24 hour(s))     LACTIC ACID          Collection Time: 07/15/18  6:34 PM         Result  Value  Ref Range            Lactic Acid  2.2 (H)  0.4  - 2.0 mmol/L       LACTIC ACID          Collection Time: 07/15/18 11:05 PM         Result  Value  Ref Range            Lactic Acid  1.3  0.4 - 2.0 mmol/L       LACTIC ACID          Collection Time: 07/16/18  4:13 AM         Result  Value  Ref Range  Lactic Acid  1.0  0.4 - 2.0 mmol/L       TSH 3RD GENERATION          Collection Time: 07/16/18  4:13 AM         Result  Value  Ref Range            TSH  0.809  0.358 - 3.740 uIU/mL       CBC W/O DIFF          Collection Time: 07/16/18  4:13 AM         Result  Value  Ref Range            WBC  8.2  4.0 - 11.0 1000/mm3       RBC  2.95 (L)  3.80 - 5.70 M/uL       HGB  8.0 (L)  12.4 - 17.2 gm/dl       HCT  25.7 (L)  37.0 - 50.0 %       MCV  87.1  80.0 - 98.0 fL       MCH  27.1  23.0 - 34.6 pg       MCHC  31.1  30.0 - 36.0 gm/dl       PLATELET  157  140 - 450 1000/mm3       MPV  11.5 (H)  6.0 - 10.0 fL       RDW-SD  60.6 (H)  35.1 - 77.8         METABOLIC PANEL, COMPREHENSIVE          Collection Time: 07/16/18  4:13 AM         Result  Value  Ref Range            Sodium  139  136 - 145 mEq/L       Potassium  4.3  3.5 - 5.1 mEq/L       Chloride  111 (H)  98 - 107 mEq/L       CO2  19 (L)  21 - 32 mEq/L       Glucose  112 (H)  74 - 106 mg/dl       BUN  29 (H)  7 - 25 mg/dl       Creatinine  1.4 (H)  0.6 - 1.3 mg/dl       GFR est AA  >60          GFR est non-AA  53          Calcium  8.4 (L)  8.5 - 10.1 mg/dl       AST (SGOT)  21  15 - 37 U/L       ALT (SGPT)  30  12 - 78 U/L       Alk. phosphatase  159 (H)  45 - 117 U/L       Bilirubin, total  0.8  0.2 - 1.0 mg/dl       Protein, total  7.0  6.4 - 8.2 gm/dl       Albumin  2.4 (L)  3.4 - 5.0 gm/dl            Anion gap  8  5 - 15 mmol/L           Xr Chest Sngl V      Result Date: 07/15/2018   AP chest. Comparison 05/19/2018 INDICATION: Sepsis.       IMPRESSION: Midline sternotomy. Cardiac silhouette normal. Lungs clear of  any acute process.       Xr Abd (kub)      Result Date: 07/15/2018   Supine abdomen. INDICATION: Fever.  FINDINGS: Left-sided double-J catheter in place. Multiple calcifications overlying the left kidney. Scattered arthritis lumbar spine. Previous lower lumbar spine surgery with pedicle screws L4 and L5 level. Penile prostheses  identified. Bowel gas pattern unremarkable.       IMPRESSION: Findings as described above       Ct Abd Pelv Wo Cont      Result Date: 06/22/2018   CT Abdomen and Pelvis without Indication: Left hydronephrosis. Evaluate for obstructing stone or lesion. Comparison: Ultrasound 06/22/2018. CT 05/21/2018. TECHNIQUE: CT of the abdomen and pelvis WITHOUT intravenous contrast. Coronal and sagittal reformations  were obtained.    All CT exams at this facility use one or more dose reduction techniques including automatic exposure control, mA/kV adjustment per patient's size, or iterative reconstruction technique. DICOM format imaged data is available to non-affiliated  external healthcare facilities or entities on a secure, media free, reciprocal searchable basis with patient authorization for 12 months following the date of the study. DISCUSSION: ABSENCE OF INTRAVENOUS CONTRAST DECREASES SENSITIVITY FOR DETECTION OF  FOCAL LESIONS AND VASCULAR PATHOLOGY. LOWER THORAX: Coronary artery calcifications. HEPATOBILIARY: Multiple subcentimeter foci of hypoattenuation, too small to characterize but unchanged. Cholecystectomy. Left intrahepatic pneumobilia, unchanged. SPLEEN:  No splenomegaly. PANCREAS: Postoperative changes from prior Whipple procedure. No mass or duct dilatation in the residual pancreatic tail. ADRENALS: No adrenal nodules. KIDNEYS/URETERS: Multiple bilateral renal cysts. Multiple small (2 to 4 mm) nonobstructing  bilateral renal calculi. There is a 10 x 9 mm nonobstructing calculus in the lower pole of the left kidney. Moderate left hydronephrosis and hydroureter. There is a 8 x 7 mm obstructing calculus in the proximal left ureter. No right hydronephrosis. No  suspicious renal lesions. PELVIC  ORGANS/BLADDER: Enlarged prostate. Penile implant. The bladder is unremarkable. PERITONEUM / RETROPERITONEUM: No free air or fluid. LYMPH NODES: No lymphadenopathy. VESSELS: Extensive atherosclerotic vascular calcifications.  Infrarenal abdominal aorta ectasia. GI TRACT: No distention or wall thickening. Diverticulosis. Normal appendix. BONES AND SOFT TISSUES: Posterior lumbar fixation at L4-5. Mild retrolisthesis at L2-3 and L3-4. Moderate to advanced disc height loss at  L2-3. Mild spondylosis of the remaining lumbar spine.       IMPRESSION: 1.  Obstructing 8 mm calculus in the left proximal ureter causing moderate left hydronephrosis. 2.  Multiple additional nonobstructing bilateral renal calculi. 3.  Multiple bilateral renal cysts. 4.  Diverticulosis. 5.  Postoperative changes  from prior Whipple procedure. 6.  Multiple hypoattenuating liver lesions, unchanged.       [place] Retroperitoneum Comp      Result Date: 07/15/2018   Examination: Ultrasound retroperitoneum complete INDICATION: Renal ultrasound for stent. FINDINGS: Exam limited due to patient body habitus and motion. RRT called on patient. The right kidney roughly measures 10.5 x 7.0 x 4.3 cm there are multiple cysts  are identified within the right kidney, measuring up to 1.9 cm within the midpole. No hydronephrosis. Renal parenchyma appears mildly echogenic. No focal mass. The left kidney roughly measures 4.4 x 8.4 x 6.2 cm. Multiple cysts are identified within the  left kidney, measuring up to 2.4 x 2.9 cm within the midpole. There is mild hydronephrosis with the AP dimension of the pelvis measuring 2.1 cm. Renal parenchyma slightly echogenic. No mass. Prevoid urinary bladder volume measures 568 mL. Post void volume  measures 408 mL. Stent is identified within the urinary bladder and  the left kidney. Prostate volume measures 31.18 mL per       IMPRESSION: 1. Both kidneys are slightly echogenic, concerning for medical renal disease. 2. Mild left-sided  hydronephrosis with a stent which appears in customary position. 3. Small bilateral renal cysts.       [place] Retroperitoneum Comp      Result Date: 06/22/2018   INDICATION: AKI with history of renal stones;    COMPARISON: CT 05/21/2018 and MRI 01/19/2018. TECHNIQUE: Renal Ultrasound FINDINGS: Right Kidney: Measures 9.6 x 4.3 x 6.4 cm. There is good corticomedullary differentiation. No hydronephrosis. Multiple  echogenic foci in the renal pelvis, consistent with calculi, the largest measuring 0.4 cm. Multiple simple cysts, the largest cyst in the mid kidney measuring 0.4 x 0.5 x 0.4 cm. No solid mass. Left Kidney: Measures 11.0 x 4.4 x 6.6 cm. There is good  corticomedullary differentiation. Moderate hydronephrosis and proximal hydroureter. Multiple echogenic foci in the renal pelvis, consistent with calculi, the largest measuring 0.6 cm. Multiple simple cysts, the largest cyst in the mid kidney measuring  1.8 x 1.7 x 1.9 cm. No solid mass. Bladder: Unremarkable.       IMPRESSION: 1.  Left hydronephrosis. 2.  Multiple bilateral nonobstructing renal calculi. 3.  Multiple bilateral simple cysts.       Xr Retro Urethrocystography      Result Date: 06/22/2018   INDICATION: L retrograde / L stent placement     PROCEDURE: XR RETRO URETHROCYSTOGRAPHY COMPARISON: None FLUOROSCOPIC TIME: 7.5 seconds. FINDINGS: Left retrograde, left stent placement.       IMPRESSION: Left retrograde, left stent placement.          Microbiology:     All Micro Results               Procedure  Component  Value  Units  Date/Time           CULTURE, URINE [[id number]]  (Abnormal)  Collected:  07/15/18 1442            Order Status:  Completed  Specimen:  Urine from Clean catch  Updated:  07/16/18 0919                Isolate  --                    >100,000 CFU/mL   Gram Negative Bacilli Isolated   Identification And Susceptibility To Follow                CULTURE, BLOOD [[id number]]  Collected:  07/15/18 1405            Order Status:  Completed  Specimen:   Peripheral blood  Updated:  07/16/18 0704               Blood Culture Result                 Culture In Progress, Daily Updates To Follow                     CULTURE, BLOOD [[id number]]  Collected:  07/15/18 1442            Order Status:  Completed  Specimen:  Peripheral blood  Updated:  07/16/18 0704               Blood Culture Result                 Culture In Progress, Daily Updates To Follow  COVID-19 (INPATIENT TESTING) [[id number]]  Collected:  07/15/18 1405            Order Status:  Completed  Specimen:  NASOPHARYNGEAL SWAB  Updated:  07/15/18 1537                COVID-19  NEGATIVE                  Comment:  Negative results do not preclude SARS-CoV-2 infection and should not be used as the sole basis   for treatment or other patient management decisions. Negative results must be combined with   clinical observations, patient history, and epidemiological information.   Testing with the Xpert Xpress SARS-CoV-2 test is intended for use by trained operators who are   proficient in performing tests using either GeneXpert Dx, GeneXpert Infinity and/or [employer]. The Xpert Xpress SARS-CoV-2 test is only for use under the Food and Drug   Administration's Emergency Use Authorization.                              Cardiology:     Results for orders placed or performed during the hospital encounter of 2018-06-26     EKG, 12 LEAD, INITIAL         Result  Value  Ref Range            Ventricular Rate  74  BPM       Atrial Rate  74  BPM       P-R Interval  178  ms       QRS Duration  98  ms       Q-T Interval  372  ms       QTC Calculation (Bezet)  412  ms       Calculated P Axis  4  degrees       Calculated R Axis  -33  degrees       Calculated T Axis  50  degrees       Diagnosis                 Normal sinus rhythm   Left axis deviation   Inferior infarct (cited on or before 19-Jan-2018)   Abnormal ECG   When compared with ECG of 19-Jan-2018 16:08,   No significant change was found    Confirmed by Macatangay-Geronilla M.D., [place] ([id number]) on 26-Jun-2018 1:04:46    PM                 Problem List:      Problem List as of 07/16/2018  Date Reviewed:  06/26/2018                        Codes  Class  Noted - Resolved             UTI (urinary tract infection)  ICD-10-CM: N39.0   ICD-9-CM: 599.0    07/15/2018 - Present                       AKI (acute kidney injury) ([place])  ICD-10-CM: N17.9   ICD-9-CM: 584.9    06-26-2018 - Present                       Sepsis ([place])  ICD-10-CM: A41.9   ICD-9-CM: 038.9, 995.91    01/17/2018 - Present  Medications reviewed     Current Facility-Administered Medications          Medication  Dose  Route  Frequency           ?  dextran 70-hypromellose (ARTIFICIAL TEARS) ophthalmic solution 1 Drop   1 Drop  Both Eyes  PRN     ?  ondansetron (ZOFRAN) injection 4 mg   4 mg  IntraVENous  Q4H PRN     ?  albuterol-ipratropium (DUO-NEB) 2.5 MG-0.5 MG/3 ML   3 mL  Nebulization  Q4H PRN     ?  lactulose (CHRONULAC) 10 gram/15 mL solution 30 mL   20 g  Oral  DAILY PRN           ?  polyethylene glycol (MIRALAX) packet 17 g   17 g  Oral  DAILY PRN           ?  zolpidem (AMBIEN) tablet 5 mg   5 mg  Oral  QHS PRN     ?  allopurinoL (ZYLOPRIM) tablet 300 mg   300 mg  Oral  DAILY     ?  amLODIPine (NORVASC) tablet 5 mg   5 mg  Oral  DAILY     ?  aspirin chewable tablet 81 mg   81 mg  Oral  DAILY     ?  gabapentin (NEURONTIN) capsule 600 mg   600 mg  Oral  BID     ?  lipase-protease-amylase (CREON 24,000) capsule 2 Cap   2 Cap  Oral  QID WITH MEALS     ?  metoprolol succinate (TOPROL-XL) XL tablet 25 mg   25 mg  Oral  DAILY     ?  tamsulosin (FLOMAX) capsule 0.4 mg   0.4 mg  Oral  DAILY     ?  sodium chloride (NS) flush 5-10 mL   5-10 mL  IntraVENous  Q8H     ?  sodium chloride (NS) flush 5-10 mL   5-10 mL  IntraVENous  PRN     ?  naloxone (NARCAN) injection 0.1 mg   0.1 mg  IntraVENous  PRN     ?  cefTRIAXone (ROCEPHIN) 1 g in 0.9% sodium chloride (MBP/ADV) 50 mL MBP   1 g   IntraVENous  Q24H     ?  acetaminophen (TYLENOL) tablet 650 mg   650 mg  Oral  Q6H PRN           ?  dextrose 5 % - 0.45% NaCl infusion   100 mL/hr  IntraVENous  CONTINUOUS              Kristen Loader, MD

## 2018-07-17 LAB — RENAL FUNCTION PANEL
Albumin: 2.2 gm/dl — ABNORMAL LOW (ref 3.4–5.0)
Albumin: 2.2 gm/dl — ABNORMAL LOW (ref 3.4–5.0)
Anion Gap: 6 mmol/L (ref 5–15)
Anion gap: 6 mmol/L (ref 5–15)
BUN: 17 mg/dl (ref 7–25)
BUN: 17 mg/dl (ref 7–25)
CO2: 20 mEq/L — ABNORMAL LOW (ref 21–32)
CO2: 20 mEq/L — ABNORMAL LOW (ref 21–32)
Calcium: 8.5 mg/dl (ref 8.5–10.1)
Calcium: 8.5 mg/dl (ref 8.5–10.1)
Chloride: 110 mEq/L — ABNORMAL HIGH (ref 98–107)
Chloride: 110 mEq/L — ABNORMAL HIGH (ref 98–107)
Creatinine: 1.2 mg/dl (ref 0.6–1.3)
Creatinine: 1.2 mg/dl (ref 0.6–1.3)
EGFR IF NonAfrican American: >60
GFR African American: >60
GFR est AA: >60
GFR est non-AA: >60
Glucose: 176 mg/dl — ABNORMAL HIGH (ref 74–106)
Glucose: 176 mg/dl — ABNORMAL HIGH (ref 74–106)
Phosphorus: 3 mg/dl (ref 2.5–4.9)
Phosphorus: 3 mg/dl (ref 2.5–4.9)
Potassium: 3.8 mEq/L (ref 3.5–5.1)
Potassium: 3.8 mEq/L (ref 3.5–5.1)
Sodium: 135 mEq/L — ABNORMAL LOW (ref 136–145)
Sodium: 135 mEq/L — ABNORMAL LOW (ref 136–145)

## 2018-07-17 LAB — CBC WITH AUTOMATED DIFF
BASOPHILS: 0.1 % (ref 0–3)
EOSINOPHILS: 1.3 % (ref 0–5)
HCT: 24.2 % — ABNORMAL LOW (ref 37.0–50.0)
HGB: 7.6 gm/dl — ABNORMAL LOW (ref 12.4–17.2)
IMMATURE GRANULOCYTES: 0.8 % (ref 0.0–3.0)
LYMPHOCYTES: 13.4 % — ABNORMAL LOW (ref 28–48)
MCH: 27 pg (ref 23.0–34.6)
MCHC: 31.4 gm/dl (ref 30.0–36.0)
MCV: 86.1 fL (ref 80.0–98.0)
MONOCYTES: 7.6 % (ref 1–13)
MPV: 11.7 fL — ABNORMAL HIGH (ref 6.0–10.0)
NEUTROPHILS: 76.8 % — ABNORMAL HIGH (ref 34–64)
NRBC: 0 (ref 0–0)
PLATELET: 180 10*3/uL (ref 140–450)
RBC: 2.81 M/uL — ABNORMAL LOW (ref 3.80–5.70)
RDW-SD: 59.4 — ABNORMAL HIGH (ref 35.1–43.9)
WBC: 7.6 10*3/uL (ref 4.0–11.0)

## 2018-07-17 LAB — CULTURE, URINE: Isolate: >100000 — AB

## 2018-07-17 LAB — MAGNESIUM
Magnesium: 1.9 mg/dl (ref 1.6–2.6)
Magnesium: 1.9 mg/dl (ref 1.6–2.6)

## 2018-07-17 LAB — CBC WITH AUTO DIFFERENTIAL
Basophils %: 0.1 % (ref 0–3)
Eosinophils %: 1.3 % (ref 0–5)
Hematocrit: 24.2 % — ABNORMAL LOW (ref 37.0–50.0)
Hemoglobin: 7.6 gm/dl — ABNORMAL LOW (ref 12.4–17.2)
Immature Granulocytes: 0.8 % (ref 0.0–3.0)
Lymphocytes %: 13.4 % — ABNORMAL LOW (ref 28–48)
MCH: 27 pg (ref 23.0–34.6)
MCHC: 31.4 gm/dl (ref 30.0–36.0)
MCV: 86.1 fL (ref 80.0–98.0)
MPV: 11.7 fL — ABNORMAL HIGH (ref 6.0–10.0)
Monocytes %: 7.6 % (ref 1–13)
Neutrophils %: 76.8 % — ABNORMAL HIGH (ref 34–64)
Nucleated RBCs: 0 (ref 0–0)
Platelets: 180 10*3/uL (ref 140–450)
RBC: 2.81 M/uL — ABNORMAL LOW (ref 3.80–5.70)
RDW-SD: 59.4 — ABNORMAL HIGH (ref 35.1–43.9)
WBC: 7.6 10*3/uL (ref 4.0–11.0)

## 2018-07-17 MED ORDER — CULTURELLE 10 BILLION CELL CAPSULE
10 billion cell | ORAL_CAPSULE | Freq: Two times a day (BID) | ORAL | 0 refills | Status: DC
Start: 2018-07-17 — End: 2020-07-10

## 2018-07-17 MED ORDER — TAMSULOSIN SR 0.4 MG 24 HR CAP
0.4 mg | Freq: Every day | ORAL | Status: DC
Start: 2018-07-17 — End: 2018-07-17

## 2018-07-17 MED ORDER — TAMSULOSIN SR 0.4 MG 24 HR CAP
0.4 mg | ORAL_CAPSULE | Freq: Every day | ORAL | 1 refills | Status: AC
Start: 2018-07-17 — End: ?

## 2018-07-17 MED ORDER — CEPHALEXIN 500 MG TABLET
500 mg | ORAL_TABLET | Freq: Three times a day (TID) | ORAL | 0 refills | Status: DC
Start: 2018-07-17 — End: 2018-08-28

## 2018-07-17 MED FILL — ZOLPIDEM 5 MG TAB: 5 mg | ORAL | Qty: 1

## 2018-07-17 MED FILL — ACETAMINOPHEN 325 MG TABLET: 325 mg | ORAL | Qty: 2

## 2018-07-17 MED FILL — GABAPENTIN 300 MG CAP: 300 mg | ORAL | Qty: 2

## 2018-07-17 MED FILL — ALLOPURINOL 300 MG TAB: 300 mg | ORAL | Qty: 1

## 2018-07-17 MED FILL — AMLODIPINE 5 MG TAB: 5 mg | ORAL | Qty: 1

## 2018-07-17 MED FILL — CEFTRIAXONE 1 GRAM SOLUTION FOR INJECTION: 1 gram | INTRAMUSCULAR | Qty: 1

## 2018-07-17 MED FILL — METOPROLOL SUCCINATE SR 25 MG 24 HR TAB: 25 mg | ORAL | Qty: 1

## 2018-07-17 MED FILL — CREON 24,000-76,000-120,000 UNIT CAPSULE,DELAYED RELEASE: 24000-76000 -120,000 unit | ORAL | Qty: 2

## 2018-07-17 MED FILL — TAMSULOSIN SR 0.4 MG 24 HR CAP: 0.4 mg | ORAL | Qty: 1

## 2018-07-17 MED FILL — ASPIRIN 81 MG CHEWABLE TAB: 81 mg | ORAL | Qty: 1

## 2018-07-17 MED FILL — NORMAL SALINE FLUSH 0.9 % INJECTION SYRINGE: INTRAMUSCULAR | Qty: 10

## 2018-07-17 NOTE — Progress Notes (Signed)
OCCUPATIONAL THERAPY EVALUATION     Patient: Tommy Brown (73 y.o. male)  Room: 6622/6622    Primary Diagnosis: Sepsis [facility]) [A41.9]  UTI (urinary tract infection) [N39.0]       Date of Admission: 07/15/2018   Length of Stay:  2 day(s)  Insurance: Payor: [facility] / Plan: [place] / Product Type: Medicare /      Date: 07/17/2018  In time:  1130        Out time:  1155    Precautions:Falls   Ordered Weight Bearing Status: None    Isolation:  There are currently no Active Isolations       MDRO: No active infections        ASSESSMENT :   Based on the objective data described below, the patient presents with   - decreased activity tolerance, increased fatigue and/or shortness of breath  affecting patient's safety and independence/ability to perform basic ADLs/IADLs    Patient will benefit from skilled occupational therapy intervention to address the above impairments.    Patient???s rehabilitation potential is considered to be Excellent for below stated goals.     Recommendations:  Recommend continued occupational therapy during acute stay. Recommend out of bed activity to counteract ill effects of bedrest, with assistance from staff as needed.  Discharge Recommendations: home health OT/PT  Further Equipment Recommendations for Discharge: tub transfer bench     Plan:   Patient will be followed by occupational therapy to address goals while hospitalized as patient's status and schedule permit.   Patient to be seen 1-3 visits x 2 weeks.    Planned interventions may include any combination of the following: ADI training, functional balance training, therapeutic exercise and patient/caregiver education and training    Patient and/or family have participated as able in goal setting and plan of care.  Occupational Therapy goals:     OT goals initiated 07/17/2018 and will be met by patient within 1-3 sessions     1.  Patient and wife will indicate good understanding of home safety recommendations.   2.  Patient will tolerate UE AROM 10 reps each all joints and planes with rest breaks as needed.   3.  Patient will increase activity tolerance to good for performance of ADLs.      Education/ communication:     Barriers to Learning/Limitations:  None  Education provided to: patient and spouse on (+) role of OT, (+) OT plan of care, (+)  bathroom safety, home safety.  Reviewed: picture of safe bathroom & sitting area, adaptive equipment use  Educational Handouts issued: home safety  Patient / Family readiness to learn indicated by: verbalized understanding and showing interest    SUBJECTIVE:     Patient "I feel better."    Wife reports, "His energy is not there yet."  "His eyes are a little sunkin."    Pain Assessment: none reported    OBJECTIVE DATA SUMMARY:     Orders, labs, and chart reviewed on [place]. Communicated with nurse.  Patient was cleared for OT eval and treat.      Patient was admitted to the hospital on 07/15/2018 with   Chief Complaint   Patient presents with   ??? Covid Testing   ??? Abnormal Lab Results     Present illness history:   Patient Active Problem List    Diagnosis Date Noted   ??? UTI (urinary tract infection) 07/15/2018   ??? AKI (acute kidney injury) ([place])  06/22/2018   ??? Sepsis ([place]) 01/17/2018      Previous medical history:   Past Medical History:   Diagnosis Date   ??? Arthritis    ??? Benign prostate hyperplasia    ??? Bladder infection    ??? Chronic kidney disease    ??? Coronary arteriosclerosis    ??? Coronary atherosclerosis of artery bypass graft    ??? Decreased testosterone level    ??? Diverticular disease    ??? Essential hypertension    ??? Gout    ??? History of acute renal failure    ??? History of anemia    ??? History of malignant neoplasm of pancreas    ??? Hx of CABG    ??? Kidney stone    ??? Kidney stones    ??? Neuropathy    ??? Pancreatic cancer ([place])    ??? Sepsis ([place])    ??? Tremor        Patient found:     Bed     Patient received / participated in 15 minutes of treatment (therapeutic activity) and/or educational instruction during/immediately following OT evaluation    Prior level of function / living situation status:     Information was obtained by:   patient, spouse  Home environment:   Patient lives with spouse in a 1st floor apt in [place], [place].  Family lives near by.    Prior level of function:Patient is independent with no limitations.  Prior level of Instrumental Activities of Daily Living:   Patient is independent with all IADLs.  Wife reports patient does all the cooking and cleaning, usually doing deep cleaning every Saturday.  Patient was independent with driving and grocery shopping prior to hospitalization.    Home equipment: 2 grab bars in the shower.  Has a tub/shower combination and a high toilet seat.      Cognitive status:     Mental status:   Orientation: Patient is oriented x 3  Communication: normal  Attention Span:   fair(15-61mn)  Follows commands: intact  Safety/Judgement: good      Activities of daily living status:   Based on direct observation, simulation and clinical assessment.      Eating:           - independent  Grooming:     - set-up  UB bathing:   - set-up  LB bathing:   - set-up  UB dressing: - set-up  LB dressing: - set-up  Toileting:       - stand-by assistance    Comment(s):   - Patient performed sitting in chair in front of the sink.    Functional mobility status:     Mobility:  Sit to stand -  standby assistance     Transfers:  Bed to commode: standby assistance    Comment(s):   - assist for IV pole.     Functional balance status:     Static Sitting Balance -           good:       maintains balance against moderate resistance  Dynamic Sitting Balance -      good (-):  independent with basic dynamic balance activities   Static Standing Balance -       good:       maintains balance against moderate resistance   Dynamic Standing Balance -  good (-):  independent with basic dynamic balance activities     Activity Tolerance: good- tolerance to activity during session  Upper extremity status:     Dominance:right  RIGHT: ACTIVE range of motion is WNL.  Strength is grossly graded as 5/5: (Completes full range of motion against gravity with maximum resistance).  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    LEFT:   ACTIVE range of motion is WNL.  Strength is grossly graded as 5/5: (Completes full range of motion against gravity with maximum resistance).  Comment: serial thumb opposition: all digits    Final location:     wife present, patient seated in chair..    Thank you for this referral.    Susanne Greenhouse. Cherica Heiden, OT      TREATMENT     OBJECTIVE:  Pt was able to stand with SBA for functional ambulation in the room.  Assist for IV pole.  He was in chair in front of the sink to perform ADLs.  He was able to groom and bathe with set up.  Patient and wife were educated on home safety recommendations.  Wife verbalizes understanding of recommendations.     ASSESSMENT:  -  Patient demonstrating good response during session.    PLAN: 1-3 visits     Susanne Greenhouse. Sherral Hammers, OT

## 2018-07-17 NOTE — Progress Notes (Signed)
Patient admitted on 07/15/2018 from home via ED with   Chief Complaint   Patient presents with   ??? Covid Testing   ??? Abnormal Lab Results          The patient is being treated for    PMH:   Past Medical History:   Diagnosis Date   ??? Arthritis    ??? Benign prostate hyperplasia    ??? Bladder infection    ??? Chronic kidney disease    ??? Coronary arteriosclerosis    ??? Coronary atherosclerosis of artery bypass graft    ??? Decreased testosterone level    ??? Diverticular disease    ??? Essential hypertension    ??? Gout    ??? History of acute renal failure    ??? History of anemia    ??? History of malignant neoplasm of pancreas    ??? Hx of CABG    ??? Kidney stone    ??? Kidney stones    ??? Neuropathy    ??? Pancreatic cancer ([place])    ??? Sepsis ([place])    ??? Tremor         Treatment Team: Treatment Team: Attending Provider: Kristen Loader, MD; Consulting Provider: Other, Phys, MD; Consulting Provider: Jonnie Kind, DO; Consulting Provider: Given, [name], MD; Care Manager: Humberto Leep; Occupational Therapist: Celedonio Savage., OT; Physical Therapist: Luan Pulling, PT      The patient has been admitted to the hospital 2  times in the past 12 months.    Previous 4 Admission Dates Admission and Discharge Diagnosis Interventions Barriers Disposition   4/27-06/26/2018 AKI   home   11/23-11/27/2019   Sepsis   home                   Patient and Family/Caregivers Goals of Care: Patient states he is tired of dealing with kidney stone issues and needs someone to figure it out and fix it so he can get better.    Caregivers Participating in Plan of Care/Discharge Plan with the patient: Patient alone    Tentative dc plan: Home with home health    Anticipated DME needs for discharge: Denies unmet needs.  CM to reassess after therapy evals    PRESCREENING COMPLETED FOR SNF No      Does the patient have appropriate clothing available to be worn at discharge? Yes       The patient and care participants are willing to travel NA area for discharge facility.  The patient and plan of care participants have been provided with a list of all available Rehab Facilities or [place] agencies as applicable. CM will follow up with a list of facilities or agencies that are offer acceptance.    CM has disclosed any financial interest that [facility] may have with any facility or agency.    Anticipated Discharge Date: 07/21/2018    The plan of care and discharge plan has been discussed with Kristen Loader, MD and all other appropriate providers and adjusted per interdisciplinary team recommendations and in discussion with the patient and the patient designated Care Plan Participants.    Barriers to Healthcare Success/ Readmission Risk Factors: risk for infection, risk for fall/injury, risk for complications r/t medical conditions (cardiac, kidney, hx pancreatic cancer)    Consults:  Palliative Care Consult Recommended: No  Transitional Care Clinic Referral: No  Transitional Nurse Navigator Referral: No  Oncology Navigator Referral: No  SW consulted: No  [facility]  Consulted: No  Outside Hospital/Community Resources Referrals and Collaboration: Will refer to [facility]    Food/Nutrition Needs:   No unmet needs                   Dietician Consulted: No    RRAT Score: Medium Risk            14       Total Score        3 Has Seen PCP in Last 6 Months (Yes=3, No=0)    2 Married. Living with Significant Other. Assisted Living. LTAC. SNF. or   Rehab    4 IP Visits Last 12 Months (1-3=4, 4=9, >4=11)    5 Pt. Coverage (Medicare=5 , Medicaid, or Self-Pay=4)        Criteria that do not apply:    Patient Length of Stay (>5 days = 3)    Charlson Comorbidity Score (Age + Comorbid Conditions)           PCP: Daralene Milch, MD . How do you get to your doctor appointments? Drives    Specialists: "I see urology, neurology, and many more."     Dialysis Unit: NA    Pharmacy: [facility] at [facility]. Are there any medications that you have trouble paying for? No  Any difficulty getting your medications? No    DME available at [facility]:  No            Home O2 Provider: No    Home Environment and Prior Level of Function: Lives at [address]  [address]  [place] [address] '@HOMEPHONE' @. Lives with wife in 1st floor apartment with no steps for entry.  Responsibilities at home include adl/iadls.    Prior to admission open services: None      Extended Emergency Contact Information  Primary Emergency Contact: [place] Phone: [phone]  Relation: None     Transportation: Wife will transport home    Therapy Recommendations:    OT = Pending     PT = Pending     SLP =  NA     RT Home O2 Evaluation =  NA    Wound Care =  NA    Case Management Assessment    ABUSE/NEGLECT SCREENING   Physical Abuse/Neglect: Denies   Sexual Abuse: Denies   Sexual Abuse: Denies   Other Abuse/Issues: Denies          PRIMARY DECISION MAKER    Patient makes own decisions                               CARE MANAGEMENT INTERVENTIONS   Readmission Interview Completed: Yes   PCP Verified by CM: Yes([facility])   Last Visit to PCP: (2-3 weeks ago)   Palliative Care Criteria Met (RRAT>21 & CHF Dx)?: No   Mode of Transport at Discharge: Other (see comment)(Family)       Transition of Care Consult (CM Consult): Home Health               Discharge Durable Medical Equipment: No(States he has walker at home, does not need anything else.  CM to follow up based on therapy recommendations.)   Physical Therapy Consult: Yes   Occupational Therapy Consult: Yes   Speech Therapy Consult: No   Current Support Network: Lives with Spouse   Reason for Referral: DCP Rounds  History Provided By: Patient, Medical Record   Patient Orientation: Alert and Oriented, Person, Place, Situation, Self   Cognition: Alert   Support System Response: Concerned, Cooperative    Previous Living Arrangement: Lives with Family Independent   Home Accessibility: Other (see comment)(1st floor apartment, no steps for entry)   Prior Functional Level: Independent in ADLs/IADLs   Current Functional Level: Assistance with the following:, Bathing, Dressing, Toileting, Mobility, Shopping, Housework, Cooking(Assist for safety r/t weakness, fall risk)       Can patient return to prior living arrangement: Yes   Ability to make needs known:: Good   Family able to assist with home care needs:: Yes   Pets: None           Types of Needs Identified: Disease Management Education, Treatment Education, Emotional Support   Anticipated Discharge Needs: [place]   Confirm Follow Up Transport: Family                  DISCHARGE LOCATION   Discharge Placement: Home with home health

## 2018-07-17 NOTE — Other (Addendum)
----------  DocumentID: [id number]------------------------------------------------              [facility]                       Patient Education Report         Name: Tommy Brown, Tommy Brown                  Date: 07/15/2018    MRN: [id number]                    Time: 6:07:31 PM         Patient ordered video: 'Patient Safety: Stay Safe While you are in the Hospital'    from [id number] via phone number: [phone] at 6:07:31 PM    Description: This program outlines some of the precautions patients can take to ensure a speedy recovery without extra complications. The video emphasizes the importance of communicating with the healthcare team.    ----------DocumentID: [id number]------------------------------------------------                       [facility]          Patient Education Report - Discharge Summary        Date: 07/17/2018   Time: 6:21:59 PM   Name: Tommy Brown, Tommy Brown   MRN: [id number]      Account Number: [id number]      Education History:        Patient ordered video: 'Patient Safety: Stay Safe While you are in the Hospital' from [id number] on 07/15/2018 06:07:31 PM

## 2018-07-17 NOTE — Progress Notes (Signed)
Discharge instructions given. Follow-up appointment made, told time/date/address/phone number. Medications reviewed, including what was given today and what needs to be given still. Patient aware of where to pick up medications/scripts provided and when to call MD or come to Ed. Family present and ready to take patient home. All questions answered and all belongings intact.

## 2018-07-17 NOTE — Progress Notes (Signed)
To return to PLOF:  Pt will be S with all bed mobility  Pt with be S with all transfers,  Pt will ambulate x100' with LRAD and S  Pt will be able to negotiate 3 steps with S  Pt will increase B LE strength by 1/2 grade  Pt will be S with HEP  Pt pain will be less than 5/10  PHYSICAL THERAPY EVALUATION    Patient: Tommy Brown (73 y.o. male)  Room: 6622/6622    Date: 07/17/2018  Start Time:  825  End Time:  850    Primary Diagnosis: Sepsis ([place]) [A41.9]  UTI (urinary tract infection) [N39.0]         Precautions: Falls.  Weight bearing precautions: None      ASSESSMENT :  Based on the objective data described below, the patient presents with  Able to ambulate with 1 person assist in room, needs to be OOB daily  Generalized muscle weakness affecting function   Decreased Strength  Decreased ADL/Functional Activities  Decreased Transfer Abilities  Decreased Ambulation Ability/Technique  Decreased Balance  Decreased Activity Tolerance.      Patient???s rehabilitation potential is considered to be Good     Recommendations:  Physical Therapy  Discharge Recommendations: Home with home health PT and TBD, pending progress  Further Equipment Recommendations for Discharge: None        PLAN :  Planned Interventions:  Functional mobility training Gait Training Stair training Balance Training Therapeutic exercises Therapeutic activities Neuro muscular re-education AD training Patient/caregiver education      Frequency/Duration: Patient will be followed by physical therapy  1-5 times/week for 2 weeks to address goals.                 Orders reviewed, chart reviewed on [place]:   Patient: agreed to PT    OBJECTIVE DATA SUMMARY:   Present illness history:   Problem List  Date Reviewed: 06-27-18          Codes Class Noted    UTI (urinary tract infection) ICD-10-CM: N39.0  ICD-9-CM: 599.0  07/15/2018        AKI (acute kidney injury) ([place]) ICD-10-CM: N17.9  ICD-9-CM: 584.9  06/27/2018         Sepsis ([place]) ICD-10-CM: A41.9  ICD-9-CM: 038.9, 995.91  01/17/2018             Past Medical history:   Past Medical History:   Diagnosis Date   ??? Arthritis    ??? Benign prostate hyperplasia    ??? Bladder infection    ??? Chronic kidney disease    ??? Coronary arteriosclerosis    ??? Coronary atherosclerosis of artery bypass graft    ??? Decreased testosterone level    ??? Diverticular disease    ??? Essential hypertension    ??? Gout    ??? History of acute renal failure    ??? History of anemia    ??? History of malignant neoplasm of pancreas    ??? Hx of CABG    ??? Kidney stone    ??? Kidney stones    ??? Neuropathy    ??? Pancreatic cancer ([place])    ??? Sepsis ([place])    ??? Tremor        Prior Level of Function/Home Situation:   Home environment: Lives with Family, 1 story.  Prior level of function: independent without limitations and Independent with ambulation  Home equipment: None      Patient  found: Bed and IV.    Pain Assessment before PT session: None reported  Pain Location:   Pain Assessment after PT session: None reported  Pain Location:    []           Yes, patient had pain medications  []           No, Patient has not had pain medications  []           Nurse notified    COGNITIVE STATUS:     Mental Status: Oriented x3.  Communication: normal.  Follows commands: intact.  General Cognition: intact .  Hearing: grossly intact.  Vision:  grossly intact.      EXTREMITIES ASSESSMENT:      Tone & Sensation:   Normal    Proprioception:   Intact    Strength:    Grossly 4-/5 B LE      Range Of Motion:  WFL      Functional mobility and balance status:     Supine to sit -  contact guard  Sit to Supine -  contact guard  Sit to Stand -  contact guard  Stand to Sit -  contact guard        Balance:   Static Sitting Balance -  good-  Dynamic Sitting Balance -  good-  Static Standing Balance -  fair  Dynamic Standing Balance -  fair-        Ambulation/Gait Training:   steady, unsteady, reciprocal, decreased cadence and decreased step height/length None contact guard, min assist, verbal cues, safety concerns, increased time and 1 person  28 feet      Therapeutic Exercises:    Patient received/participated in 10 minutes of treatment (therapeutic exercises/activities) immediately following evaluation and/or educational instruction during/immediately following PT evaluation.        Activity Tolerance:   fair-    Final Location:   bedside chair, chair alarm, all needs close, agrees to call for assistance and nurse notified     COMMUNICATION/EDUCATION:   Education: Patient, Benefit of activity while hospitalized, Call for assistance, Out of bed 2-3 times/day, Staff assistance with mobility, Changes positions frequently, Sit out of bed for 45-60 minutes or as tolerated, Safety, Functional mobility, Demonstrates adequately, Verbalized understanding, Teaching method, Verbal and Role of PT  Barriers to Learning/Limitations: None    Please refer to care plan and patient education section for further details.    Thank you for this referral.  Luan Pulling, PT, DPT  Pager [phone]

## 2018-07-17 NOTE — Discharge Summary (Signed)
Physician Discharge Summary     Patient ID:  Patient Name: Tommy Brown  Medical Record Number: [id number]  Date of Birth: February 12, 1946  Age: 73 y.o.    Primary Care Provider: Daralene Milch, MD    Code Status: Full Code     Admit date: 07/15/2018    Discharge Date:  07/17/2018    Admitting Physician: Jonnie Kind, DO    Discharge Physician: Kristen Loader, MD    Discharge Disposition: Home    Activity: Activity as tolerated    Diet: Cardiac Diet    Follow-up appointments:     Follow-up Information     Follow up With Specialties Details Why Contact Info    Daralene Milch, MD [facility]   [address]  [address] [place] [address]  [phone]          -GU: Dr Crist Infante 1-2 weeks for definitive stone surgery    -PCP: Dr Rae Lips 1 week      Patient Instructions:   Current Discharge Medication List      START taking these medications    Details   lactobacillus rhamnosus gg 10 billion cell (Culturelle) 10 billion cell capsule Take 1 Cap by mouth Before breakfast and dinner.  Qty: 40 Cap, Refills: 0      cephalexin 500 mg tab Take 500 mg by mouth three (3) times daily.  Qty: 42 Tab, Refills: 0         CONTINUE these medications which have CHANGED    Details   tamsulosin (Flomax) 0.4 mg capsule Take 2 Caps by mouth daily.  Qty: 60 Cap, Refills: 1         CONTINUE these medications which have NOT CHANGED    Details   peg 400-propylene glycol (Systane, propylene glycol,) 0.4-0.3 % drop Administer 2 Drops to both eyes daily. Patient places two drops in both eyes every AM.   Indications: dry eye      metoprolol tartrate (LOPRESSOR) 25 mg tablet Take 25 mg by mouth daily.      gabapentin (NEURONTIN) 600 mg tablet Take 600 mg by mouth two (2) times a day.      amLODIPine (NORVASC) 5 mg tablet Take 5 mg by mouth daily.      allopurinol (ZYLOPRIM) 300 mg tablet Take  by mouth daily.       lipase-protease-amylase (ZENPEP) 20,000-63,000- 84,000 unit capsule Take 8 Caps by mouth daily.      aspirin 81 mg chewable tablet Take 81 mg by mouth daily.      TESTOSTERONE IM by IntraMUSCular route.             Admission Diagnoses: Sepsis ([place]) [A41.9]  UTI (urinary tract infection) [N39.0]    Discharge Diagnoses: see below    Admission Condition: fair    Discharged Condition: good    Hospital Course:       Complicated UTI  L Ureteral stent s/p lithotripsy 05/2018  CAD s/p CABG  CKD3  HTN  Gout  Pancreatic cancer s/p Whipple/XRT.  BPH  HLD  Metabolic acidosis  ??  Plan:  ??  Fever appears to have subsided  Urology input noted and appreciated  It appears that GU will be unable to perform procedure until late next week or the following week.  Pt was on ceftriaxone 1 g IV every 24h while inpt  Patient will likely need antibiotics duration long enough to get him to the procedure date  GU  increased Flomax 0.4-->0.8mg  PO daily and the pt noted less frequency 5/22  Urine Cx Grew E coli sensitive to Ancef. Pt will go home on Keflex 500mg  PO TID for 14 days.  Pt will need to be on abx until his surgery is planned so the abx may need to be renewed.   GU also noted pt will need neg UCx check in the future.  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  DVT prophylaxis   SCDs while inpt  ??  CODE STATUS.  Full code  ??  Disposition  Patient appears to be doing better.   Abx chosen for E coli  Home today and f/u with Dr Oleta Mouse in 1-2 weeks for definitive stone therapy.    ------------------     65 minutes were spent on the discharge of the patient of which more than 50% was spent in coordination of care and counseling (time spent with patient/family face to face, physical exam, reviewing laboratory and imaging investigations, speaking with physicians and nursing staff involved in this patient's care)    ------------------  Physical exam:  General:  Alert, NAD, pleasant.  Cooperative  HEENT: Sclera anicteric, PERRL, OM moist, throat clear.  Chest:  CTA B, no wheeze, no rales, no rhonchi.  Good air movement.  CV: RRR, S1/S2 were normal, no murmur  Abdomen: NTND, soft, NABS, no masses were noted.  No rebound, no guarding.  Extremities: No edema.    Subjective:     Pt said that he feels great. No CP. RN noted low grade temps, but no fever (none >=100.69F).  We discussed E coli sensitive to Keflex for which he is to be on med 2 week or whenever cleared by GU (Dr Oleta Mouse) pt will need to f/u with Dr Oleta Mouse for definitive stone surgery. Pt understood      Visit Vitals  BP 121/57 (BP Patient Position: Supine)   Pulse 72   Temp 99.2 ??F (37.3 ??C)   Resp 18   Ht 5\' 8"  (1.727 m)   Wt 68.4 kg (150 lb 12.7 oz)   SpO2 99%   BMI 22.93 kg/m??         Initial presentation and work-up:  [As per Dr  Webb Silversmith [facility]'s H+P from 07/15/2018]  #############  "  Chief Complaint   Patient presents with   ??? Covid Testing   ??? Abnormal Lab Results   ??  ??  ??  HPI:   Tommy Brown is a 73 y.o. year old male past medical history of coronary artery disease, hypertension, chronic kidney disease stage III, gout who presents from home today with concern of ongoing fever, chills, Reiger's, feeling unwell with poor appetite.  He states he was started to be treated for a UTI by his primary care physician and has been on Levaquin.  Last urine culture from April 27 grew E. coli which was resistant to Levaquin.  Patient does report dark urine, mild flank discomfort.  No nausea or vomiting but has not really eaten or drank much over the last few days.  ??   Patient had a ureteral stent placed April 2020 following laser and lithotripsy for stone.  ??  ER course: CBC reveals white blood cell count 7.7 hemoglobin 8.7 platelets 177.  Metabolic work-up  shows creatinine of 1.8 insistent with his baseline chronic kidney disease.  Urinalysis is 2+ bacteria large leukocyte greater than 50 white blood cells.  Chest x-ray unremarkable.  Coronavirus swab was negative.  Patient given 2 g of ceftriaxone.  Urology was consulted recommends retroperitoneal ultrasound to evaluate for hydronephrosis.  ??  Review of Systems -   Review of Systems   Constitutional: Positive for chills, fever and malaise/fatigue.   HENT: Negative for congestion, ear pain, sinus pain and sore throat.    Eyes: Negative for blurred vision and photophobia.   Respiratory: Negative for cough, shortness of breath and wheezing.    Cardiovascular: Negative for chest pain, palpitations and leg swelling.   Gastrointestinal: Negative for abdominal pain, constipation, diarrhea, heartburn, nausea and vomiting.   Genitourinary: Positive for urgency. Negative for dysuria and flank pain.   Musculoskeletal: Negative for myalgias.   Skin: Negative for itching and rash.   Neurological: Negative for dizziness, weakness and headaches.   Endo/Heme/Allergies: Negative for polydipsia.   Psychiatric/Behavioral: Negative for depression. The patient is not nervous/anxious.    ??  ??  ??  Past Medical History:       Past Medical History:   Diagnosis Date   ??? Arthritis ??   ??? Benign prostate hyperplasia ??   ??? Bladder infection ??   ??? Chronic kidney disease ??   ??? Coronary arteriosclerosis ??   ??? Coronary atherosclerosis of artery bypass graft ??   ??? Decreased testosterone level ??   ??? Diverticular disease ??   ??? Essential hypertension ??   ??? Gout ??   ??? History of acute renal failure ??   ??? History of anemia ??   ??? History of malignant neoplasm of pancreas ??   ??? Hx of CABG ??   ??? Kidney stone ??   ??? Kidney stones ??   ??? Neuropathy ??    ??? Pancreatic cancer (HCC) ??   ??? Sepsis ([place]) ??   ??? Tremor ??   ??  ??  Past Surgical History:        Past Surgical History:   Procedure Laterality Date   ??? HX ORTHOPAEDIC ?? ??   ?? back   ??? HX OTHER SURGICAL ?? 2006   ?? whipple procedure done for pancreatic cancer   ??? HX UROLOGICAL ?? 2012   ?? Percutaneous extraction of a kidney stone w/ fragmentation procedure    ??  ??  Family History:        Family History   Problem Relation Age of Onset   ??? Alcohol abuse Father ??   ??? Heart Disease Brother ??   ??? Emphysema Brother ??   ??  ??  Social History:  Social History      ??        Socioeconomic History   ??? Marital status: MARRIED   ?? ?? Spouse name: Not on file   ??? Number of children: Not on file   ??? Years of education: Not on file   ??? Highest education level: Not on file   Tobacco Use   ??? Smoking status: Never Smoker   ??? Smokeless tobacco: Never Used   Substance and Sexual Activity   ??? Alcohol use: Not Currently   ??? Drug use: Not Currently   ??? Sexual activity: Not Currently   Social History Narrative   ?? ** Merged History Encounter **   ??        ??  ??  Home Medications:  Prior to Admission medications    Medication Sig Start Date End Date Taking? Authorizing Provider   metoprolol tartrate (LOPRESSOR) 25 mg tablet Take 25 mg by mouth daily. ?? ?? ?? Other, Phys, MD   gabapentin (NEURONTIN) 600 mg tablet Take 600 mg by mouth two (2) times a day. ?? ?? ?? Other, Phys, MD   amLODIPine (NORVASC) 5 mg tablet Take 5 mg by mouth daily. ?? ?? ?? Other, Phys, MD   allopurinol (ZYLOPRIM) 300 mg tablet Take  by mouth daily. ?? ?? ?? Other, Phys, MD   tamsulosin (FLOMAX) 0.4 mg capsule Take 0.4 mg by mouth daily. ?? ?? ?? Other, Phys, MD   lipase-protease-amylase (ZENPEP) 20,000-63,000- 84,000 unit capsule Take 8 Caps by mouth daily. ?? ?? ?? Other, Phys, MD   tiZANidine (ZANAFLEX) 4 mg tablet Take 4 mg by mouth nightly. ?? ?? ?? Other, Phys, MD   fluticasone propionate 100 mcg/actuation dsdv Take  by inhalation. ?? ?? ?? Other, Phys, MD    aspirin 81 mg chewable tablet Take 81 mg by mouth daily. ?? ?? ?? Other, Phys, MD   TESTOSTERONE IM by IntraMUSCular route. ?? ?? ?? Other, Phys, MD   ??  ??  Allergies:        Allergies   Allergen Reactions   ??? Other Food Other (comments)   ?? ?? VISTAID  ANESTHESIA   ??? Codeine Itching and Swelling   ??? Oxycodone Anaphylaxis   ??? Hydroxyzine Pamoate Itching and Other (comments)   ?? ?? Other reaction(s): Unknown  ??   ??? Meperidine Itching, Palpitations, Swelling and Other (comments)   ?? ?? Other reaction(s): Unknown  ??   ??? Metoclopramide Itching, Other (comments) and Swelling   ?? ?? Other reaction(s): Other (See Comments)  Reaction unknown   Reaction unknown   ??   ??? Morphine Other (comments) and Nausea and Vomiting   ??? Oxycodone-Acetaminophen Itching   ??? Prochlorperazine Itching, Other (comments) and Unknown (comments)   ?? ?? Other reaction(s): Unknown  Unsure of reaction  ??   ??? Hydroxyzine Hcl Hives   ??  ??  ??  Physical Exam:   ??  Visit Vitals  BP 133/56   Pulse 66   Temp 98.5 ??F (36.9 ??C)   Resp 16   Ht 5\' 8"  (1.727 m)   Wt 59.9 kg (132 lb)   SpO2 96%   BMI 20.07 kg/m??   ??  Physical Exam  Constitutional:       General: He is not in acute distress.     Appearance: He is not diaphoretic.   HENT:      Head: Normocephalic and atraumatic.      Right Ear: External ear normal.      Left Ear: External ear normal.      Nose: Nose normal.      Mouth/Throat:      Dentition: Normal dentition.   Eyes:      General:         Right eye: No discharge.         Left eye: No discharge.      Conjunctiva/sclera: Conjunctivae normal.      Pupils: Pupils are equal, round, and reactive to light.   Neck:      Musculoskeletal: Neck supple.      Thyroid: No thyromegaly.      Vascular: No JVD.   Cardiovascular:      Rate and Rhythm: Normal rate and regular rhythm.  Heart sounds: Normal heart sounds. No murmur. No friction rub.   Pulmonary:      Effort: Pulmonary effort is normal. No respiratory distress.       Breath sounds: Normal breath sounds. No wheezing.   Abdominal:      General: Bowel sounds are normal. There is no distension.      Palpations: Abdomen is soft.      Tenderness: There is no abdominal tenderness. There is no guarding or rebound.   Musculoskeletal: Normal range of motion.   Skin:     General: Skin is warm and dry.   Neurological:      Mental Status: He is alert and oriented to person, place, and time.      Cranial Nerves: No cranial nerve deficit.      Coordination: Coordination normal.      Deep Tendon Reflexes: Reflexes are normal and symmetric.   Psychiatric:         Mood and Affect: Mood and affect normal.         Cognition and Memory: Memory normal.         Judgment: Judgment normal. "  #############    ACUTE DIAGNOSES:  Sepsis ([place]) [A41.9]  UTI (urinary tract infection) [N39.0]    CHRONIC MEDICAL DIAGNOSES:  Problem List as of 07/17/2018 Date Reviewed: 06-30-2018          Codes Class Noted - Resolved    UTI (urinary tract infection) ICD-10-CM: N39.0  ICD-9-CM: 599.0  07/15/2018 - Present        AKI (acute kidney injury) ([place]) ICD-10-CM: N17.9  ICD-9-CM: 584.9  06/30/2018 - Present        Sepsis ([place]) ICD-10-CM: A41.9  ICD-9-CM: 038.9, 995.91  01/17/2018 - Present                Data Review:       Recent Days:  Recent Labs     07/17/18  1141 07/16/18  0413 07/15/18  1405   WBC 7.6 8.2 7.7   HGB 7.6* 8.0* 8.7*   HCT 24.2* 25.7* 28.2*   PLT 180 157 177     Recent Labs     07/17/18  1141 07/16/18  0413 07/15/18  1405   NA 135* 139 137   K 3.8 4.3 4.5   CL 110* 111* 108*   CO2 20* 19* 21   GLU 176* 112* 125*   BUN 17 29* 36*   CREA 1.2 1.4* 1.8*   CA 8.5 8.4* 9.0   MG 1.9  --   --    PHOS 3.0  --   --    ALB 2.2* 2.4* 2.7*   TBILI  --  0.8 0.7   SGOT  --  21 35   ALT  --  30 40     No results for input(s): PH, PCO2, PO2, HCO3, FIO2 in the last 72 hours.    24 Hour Results:  Recent Results (from the past 24 hour(s))   RENAL FUNCTION PANEL    Collection Time: 07/17/18 11:41 AM   Result Value Ref Range     Sodium 135 (L) 136 - 145 mEq/L    Potassium 3.8 3.5 - 5.1 mEq/L    Chloride 110 (H) 98 - 107 mEq/L    CO2 20 (L) 21 - 32 mEq/L    Glucose 176 (H) 74 - 106 mg/dl    BUN 17 7 - 25 mg/dl    Creatinine 1.2 0.6 - 1.3 mg/dl  GFR est AA >60      GFR est non-AA >60      Calcium 8.5 8.5 - 10.1 mg/dl    Albumin 2.2 (L) 3.4 - 5.0 gm/dl    Phosphorus 3.0 2.5 - 4.9 mg/dl    Anion gap 6 5 - 15 mmol/L   MAGNESIUM    Collection Time: 07/17/18 11:41 AM   Result Value Ref Range    Magnesium 1.9 1.6 - 2.6 mg/dl   CBC WITH AUTOMATED DIFF    Collection Time: 07/17/18 11:41 AM   Result Value Ref Range    WBC 7.6 4.0 - 11.0 1000/mm3    RBC 2.81 (L) 3.80 - 5.70 M/uL    HGB 7.6 (L) 12.4 - 17.2 gm/dl    HCT 24.2 (L) 37.0 - 50.0 %    MCV 86.1 80.0 - 98.0 fL    MCH 27.0 23.0 - 34.6 pg    MCHC 31.4 30.0 - 36.0 gm/dl    PLATELET 180 140 - 450 1000/mm3    MPV 11.7 (H) 6.0 - 10.0 fL    RDW-SD 59.4 (H) 35.1 - 43.9      NRBC 0 0 - 0      IMMATURE GRANULOCYTES 0.8 0.0 - 3.0 %    NEUTROPHILS 76.8 (H) 34 - 64 %    LYMPHOCYTES 13.4 (L) 28 - 48 %    MONOCYTES 7.6 1 - 13 %    EOSINOPHILS 1.3 0 - 5 %    BASOPHILS 0.1 0 - 3 %       Xr Chest Sngl V    Result Date: 07/15/2018  AP chest. Comparison 05/19/2018 INDICATION: Sepsis.     IMPRESSION: Midline sternotomy. Cardiac silhouette normal. Lungs clear of any acute process.     Xr Abd (kub)    Result Date: 07/15/2018  Supine abdomen. INDICATION: Fever. FINDINGS: Left-sided double-J catheter in place. Multiple calcifications overlying the left kidney. Scattered arthritis lumbar spine. Previous lower lumbar spine surgery with pedicle screws L4 and L5 level. Penile prostheses identified. Bowel gas pattern unremarkable.     IMPRESSION: Findings as described above     Ct Abd Pelv Wo Cont    Result Date: 06/22/2018   CT Abdomen and Pelvis without Indication: Left hydronephrosis. Evaluate for obstructing stone or lesion. Comparison: Ultrasound 06/22/2018. CT 05/21/2018. TECHNIQUE: CT of the abdomen and pelvis WITHOUT intravenous contrast. Coronal and sagittal reformations were obtained.    All CT exams at this facility use one or more dose reduction techniques including automatic exposure control, mA/kV adjustment per patient's size, or iterative reconstruction technique. DICOM format imaged data is available to non-affiliated external healthcare facilities or entities on a secure, media free, reciprocal searchable basis with patient authorization for 12 months following the date of the study. DISCUSSION: ABSENCE OF INTRAVENOUS CONTRAST DECREASES SENSITIVITY FOR DETECTION OF FOCAL LESIONS AND VASCULAR PATHOLOGY. LOWER THORAX: Coronary artery calcifications. HEPATOBILIARY: Multiple subcentimeter foci of hypoattenuation, too small to characterize but unchanged. Cholecystectomy. Left intrahepatic pneumobilia, unchanged. SPLEEN: No splenomegaly. PANCREAS: Postoperative changes from prior Whipple procedure. No mass or duct dilatation in the residual pancreatic tail. ADRENALS: No adrenal nodules. KIDNEYS/URETERS: Multiple bilateral renal cysts. Multiple small (2 to 4 mm) nonobstructing bilateral renal calculi. There is a 10 x 9 mm nonobstructing calculus in the lower pole of the left kidney. Moderate left hydronephrosis and hydroureter. There is a 8 x 7 mm obstructing calculus in the proximal left ureter. No right hydronephrosis. No suspicious renal lesions. PELVIC ORGANS/BLADDER: Enlarged prostate.  Penile implant. The bladder is unremarkable. PERITONEUM / RETROPERITONEUM: No free air or fluid. LYMPH NODES: No lymphadenopathy. VESSELS: Extensive atherosclerotic vascular calcifications. Infrarenal abdominal aorta ectasia. GI TRACT: No distention or wall thickening. Diverticulosis. Normal appendix. BONES AND SOFT TISSUES: Posterior lumbar  fixation at L4-5. Mild retrolisthesis at L2-3 and L3-4. Moderate to advanced disc height loss at L2-3. Mild spondylosis of the remaining lumbar spine.     IMPRESSION: 1.  Obstructing 8 mm calculus in the left proximal ureter causing moderate left hydronephrosis. 2.  Multiple additional nonobstructing bilateral renal calculi. 3.  Multiple bilateral renal cysts. 4.  Diverticulosis. 5.  Postoperative changes from prior Whipple procedure. 6.  Multiple hypoattenuating liver lesions, unchanged.     [place] Retroperitoneum Comp    Result Date: 07/15/2018  Examination: Ultrasound retroperitoneum complete INDICATION: Renal ultrasound for stent. FINDINGS: Exam limited due to patient body habitus and motion. RRT called on patient. The right kidney roughly measures 10.5 x 7.0 x 4.3 cm there are multiple cysts are identified within the right kidney, measuring up to 1.9 cm within the midpole. No hydronephrosis. Renal parenchyma appears mildly echogenic. No focal mass. The left kidney roughly measures 4.4 x 8.4 x 6.2 cm. Multiple cysts are identified within the left kidney, measuring up to 2.4 x 2.9 cm within the midpole. There is mild hydronephrosis with the AP dimension of the pelvis measuring 2.1 cm. Renal parenchyma slightly echogenic. No mass. Prevoid urinary bladder volume measures 568 mL. Post void volume measures 408 mL. Stent is identified within the urinary bladder and the left kidney. Prostate volume measures 31.18 mL per     IMPRESSION: 1. Both kidneys are slightly echogenic, concerning for medical renal disease. 2. Mild left-sided hydronephrosis with a stent which appears in customary position. 3. Small bilateral renal cysts.     [place] Retroperitoneum Comp    Result Date: 06/22/2018   INDICATION: AKI with history of renal stones;    COMPARISON: CT 05/21/2018 and MRI 01/19/2018. TECHNIQUE: Renal Ultrasound FINDINGS: Right Kidney: Measures 9.6 x 4.3 x 6.4 cm. There is good corticomedullary differentiation. No hydronephrosis. Multiple echogenic foci in the renal pelvis, consistent with calculi, the largest measuring 0.4 cm. Multiple simple cysts, the largest cyst in the mid kidney measuring 0.4 x 0.5 x 0.4 cm. No solid mass. Left Kidney: Measures 11.0 x 4.4 x 6.6 cm. There is good corticomedullary differentiation. Moderate hydronephrosis and proximal hydroureter. Multiple echogenic foci in the renal pelvis, consistent with calculi, the largest measuring 0.6 cm. Multiple simple cysts, the largest cyst in the mid kidney measuring 1.8 x 1.7 x 1.9 cm. No solid mass. Bladder: Unremarkable.     IMPRESSION: 1.  Left hydronephrosis. 2.  Multiple bilateral nonobstructing renal calculi. 3.  Multiple bilateral simple cysts.     Xr Retro Urethrocystography    Result Date: 06/22/2018  INDICATION: L retrograde / L stent placement     PROCEDURE: XR RETRO URETHROCYSTOGRAPHY COMPARISON: None FLUOROSCOPIC TIME: 7.5 seconds. FINDINGS: Left retrograde, left stent placement.     IMPRESSION: Left retrograde, left stent placement.       Microbiology:  All Micro Results     Procedure Component Value Units Date/Time    CULTURE, BLOOD [[id number]] Collected:  07/15/18 1405    Order Status:  Completed Specimen:  Peripheral blood Updated:  07/17/18 0704     Blood Culture Result No Growth At 24 Hours       CULTURE, BLOOD [[id number]] Collected:  07/15/18 1442    Order Status:  Completed  Specimen:  Peripheral blood Updated:  07/17/18 0703     Blood Culture Result No Growth At 24 Hours       CULTURE, URINE [[id number]]  (Abnormal)  (Susceptibility) Collected:  07/15/18 1442    Order Status:  Completed Specimen:  Urine from Clean catch Updated:  07/16/18 2151     Isolate --        >100,000 CFU/mL  Escherichia coli       COVID-19 (INPATIENT TESTING) [[id number]] Collected:  07/15/18 1405    Order Status:  Completed Specimen:  NASOPHARYNGEAL SWAB Updated:  07/15/18 1537     COVID-19 NEGATIVE        Comment: Negative results do not preclude SARS-CoV-2 infection and should not be used as the sole basis  for treatment or other patient management decisions. Negative results must be combined with  clinical observations, patient history, and epidemiological information.  Testing with the Xpert Xpress SARS-CoV-2 test is intended for use by trained operators who are  proficient in performing tests using either GeneXpert Dx, GeneXpert Infinity and/or [employer]. The Xpert Xpress SARS-CoV-2 test is only for use under the Food and Drug  Administration's Emergency Use Authorization.                Cardiology:  Results for orders placed or performed during the hospital encounter of 06/24/18   EKG, 12 LEAD, INITIAL   Result Value Ref Range    Ventricular Rate 74 BPM    Atrial Rate 74 BPM    P-R Interval 178 ms    QRS Duration 98 ms    Q-T Interval 372 ms    QTC Calculation ([place]) 412 ms    Calculated P Axis 4 degrees    Calculated R Axis -33 degrees    Calculated T Axis 50 degrees    Diagnosis       Normal sinus rhythm  Left axis deviation  Inferior infarct (cited on or before 19-Jan-2018)  Abnormal ECG  When compared with ECG of 19-Jan-2018 16:08,  No significant change was found  Confirmed by Macatangay-Geronilla M.D., [place] ([id number]) on 24-Jun-2018 1:04:46   PM           Problem List:  Problem List as of 07/17/2018 Date Reviewed: 06-24-18          Codes Class Noted - Resolved    UTI (urinary tract infection) ICD-10-CM: N39.0  ICD-9-CM: 599.0  07/15/2018 - Present        AKI (acute kidney injury) ([place]) ICD-10-CM: N17.9  ICD-9-CM: 584.9  06-24-2018 - Present        Sepsis ([place]) ICD-10-CM: A41.9  ICD-9-CM: 038.9, 995.91  01/17/2018 - Present              Signed:  Kristen Loader, MD  07/17/2018

## 2018-07-17 NOTE — Progress Notes (Addendum)
Urology Progress Note        Assessment/Plan:     Patient Active Problem List   Diagnosis Code   ??? Sepsis ([place]) A41.9   ??? AKI (acute kidney injury) ([place]) N17.9   ??? UTI (urinary tract infection) N39.0     Tommy Brown is a 73 year old male  ??  Assessment:  - Complicated UTI????- failed outpatient treatment of Levoquin??              Urine culture 07/15/18: >100 K Gram Negative Bacilli Isolated              Blood Culture 07/15/18: No Growth At 24 Hours               Note: Urine culture 06/22/18 30 K Escherichia coli with resistant pattern  ??  - Left 2mm proximal ureteral stone with hydroureteronephrosis??and 61mm LLP stone  ????????????????????????- s/p cystoscopy, left JJ stent on 06/22/2018 by Dr. Claiborne Billings   - AKI with baseline creatinine 1.5  ????????????????????????- Peak creatinine 1.8 (5/20)  ????????????????????????- Current creatinine 1.4   - Fevers??(up to 104.5)??and rigors??at home - temperature upon admission 98.5  ????????????????????????- WBC upon admission 7.7 >  8.2 07/16/18  - Made NPO yesterday - Per Dr. Loleta Books "  Renal and bladder ultrasound reviewed; mild left      hydronephrosis may just be intrarenal reflux from stent, post void bladder ultrasound did            show elevated residual of 400 mL" - foley requested to be placed and noted to be          refused by the patient and PVR's being monitored and the patient states he is voiding  well??              07/16/18 - Pt voided 200 mL/ Bladder scan 175 mL    - Discussed with Dr. Oleta Mouse unlikely patient will be ready for procedure by Tuesday.   Will need negative repeat culture - the patient states he does not want to remain in the hospital waiting for a negative culture and prefers to possibly go home on po antibiotic and arrange outpatient - have reviewed this with Dr. Oleta Mouse  ??  - Hx of nephrolithiasis s/p URS with LL in 02/2017  - Hx of ED with IPP  - Hx of Pancreatic cancer s/p Whipple in 2006, chemotherapy and radiation,           Plan:     Consider transition to Oral antibiotics knowing that the patient had an E coli previously and had been on Keflex and may need to switch to Augmentin or Omnicef as outpatient medication  RTC for repeat urine culture to be set up for return for stone management knowingt that his stent was placed on 06/22/18        Follow up arranged? YES  Note sent to [place]. Campbell Riches   [facility].  [place], [place]     Addendum:  Patient seen and examined independently.I have reviewed and agree with above assessment and plan.  S/p left stent placement 06/22/18 for 61mm stone and UTI.  E. Coli UTI/sepsis with some persistent fevers.  Previously discharged on Keflex tid.  Currently on Rocephin.  Renal US 5/20 shows mild left hydro with stent in appropriate position.  PVR 408cc's and 175cc's   Patient declined foley cath.  Will increase flomax to 0.8mg  dosage.  Definitive stone surgery deferred until infection cleared, likely in 1-2 weeks.    Nils Flack, MD, FACS  07/17/2018        Subjective:     Daily Progress Note: 07/17/2018 11:48 AM    Tommy Brown is doing well but very adamant about not remaining in the hospital; any longer     Objective:     Visit Vitals  BP 91/50 (BP Patient Position: Sitting)   Pulse 78   Temp 99.7 ??F (37.6 ??C)   Resp 16   Ht 5\' 8"  (1.727 m)   Wt 68.4 kg (150 lb 12.7 oz)   SpO2 95%   BMI 22.93 kg/m??        Temp (24hrs), Avg:100.1 ??F (37.8 ??C), Min:98.7 ??F (37.1 ??C), Max:102.7 ??F (39.3 ??C)      Intake and Output:  05/20 1901 - 05/22 0700  In: 2419 [P.O.:240; I.V.:2179]  Out: 2450 [Urine:2450]  05/22 0701 - 05/22 1900  In: -   Out: 690 [Urine:690]    PHYSICAL EXAMINATION:   Visit Vitals  BP 91/50 (BP Patient Position: Sitting)   Pulse 78   Temp 99.7 ??F (37.6 ??C)   Resp 16   Ht 5\' 8"  (1.727 m)   Wt 68.4 kg (150 lb 12.7 oz)   SpO2 95%   BMI 22.93 kg/m??     Constitutional:??Well developed, well nourished male. ??No acute distress. ??   HEENT: Normocephalic, Atraumatic, EOM's intact   CV:????no edema  Respiratory: No respiratory distress or difficulties breathing   Abdomen:????soft and non tender   GU Male:????No longer reporting  Left??CVA tenderness - mild  Skin:??No evidence of jaundice. ??Normal color  Neuro/Psych:????Alert and oriented. Affect appropriate.   MSK: normal ROM??  ??  ??  REVIEW OF LABS AND IMAGING:??  ??  Renal Ultrasound 07/15/18:  IMPRESSION:  1. Both kidneys are slightly echogenic, concerning for medical renal disease.  2. Mild left-sided hydronephrosis with a stent which appears in customary  position.  3. Small bilateral renal cysts.  ??  ??  KUB 07/15/2018  FINDINGS: Left-sided double-J catheter in place. Multiple calcifications  overlying the left kidney. Scattered arthritis lumbar spine. Previous lower  lumbar spine surgery with pedicle screws L4 and L5 level. Penile prostheses  identified. Bowel gas pattern unremarkable.      Labs:     Labs: Results:   Chemistry    Recent Labs     07/16/18  0413 07/15/18  1405   GLU 112* 125*   NA 139 137   K 4.3 4.5   CL 111* 108*   CO2 19* 21   BUN 29* 36*   CREA 1.4* 1.8*   CA 8.4* 9.0   AGAP 8 8   AP 159* 190*   TP 7.0 8.0   ALB 2.4* 2.7*      CBC w/Diff Recent Labs     07/16/18  0413 07/15/18  1405   WBC 8.2 7.7   RBC 2.95* 3.16*   HGB 8.0* 8.7*   HCT 25.7* 28.2*   PLT 157 177   GRANS  --  81.8*   LYMPH  --  8.5*   EOS  --  0.7      Cultures No results for input(s): CULT in the last 72 hours.  All Micro Results     Procedure Component Value Units Date/Time    CULTURE, BLOOD [[id number]] Collected:  07/15/18 1405    Order Status:  Completed Specimen:  Peripheral blood Updated:  07/17/18 0704     Blood Culture Result No Growth At 24 Hours       CULTURE, BLOOD [[id number]] Collected:  07/15/18 1442    Order Status:  Completed Specimen:  Peripheral blood Updated:  07/17/18 0703     Blood Culture Result No Growth At 24 Hours        CULTURE, URINE [[id number]]  (Abnormal)  (Susceptibility) Collected:  07/15/18 1442    Order Status:  Completed Specimen:  Urine from Clean catch Updated:  07/16/18 2151     Isolate --        >100,000 CFU/mL  Escherichia coli      COVID-19 (INPATIENT TESTING) [[id number]] Collected:  07/15/18 1405    Order Status:  Completed Specimen:  NASOPHARYNGEAL SWAB Updated:  07/15/18 1537     COVID-19 NEGATIVE        Comment: Negative results do not preclude SARS-CoV-2 infection and should not be used as the sole basis  for treatment or other patient management decisions. Negative results must be combined with  clinical observations, patient history, and epidemiological information.  Testing with the Xpert Xpress SARS-CoV-2 test is intended for use by trained operators who are  proficient in performing tests using either GeneXpert Dx, GeneXpert Infinity and/or [employer]. The Xpert Xpress SARS-CoV-2 test is only for use under the Food and Drug  Administration's Emergency Use Authorization.                 Urinalysis Color   Date Value Ref Range Status   07/15/2018 Amber   Final     Appearance   Date Value Ref Range Status   07/15/2018 Turbid   Final     Specific gravity   Date Value Ref Range Status   07/15/2018 1.020 1.005 - 1.030   Final     pH (UA)   Date Value Ref Range Status   07/15/2018 5.5 5 - 9   Final     Protein   Date Value Ref Range Status   07/15/2018 100 (A) NEGATIVE,Negative mg/dl Final     Ketone   Date Value Ref Range Status   07/15/2018 Negative NEGATIVE,Negative mg/dl Final     Bilirubin   Date Value Ref Range Status   07/15/2018 Negative NEGATIVE,Negative   Final     Blood   Date Value Ref Range Status   07/15/2018 Large (A) NEGATIVE,Negative   Final     Comment:     Operator: [id number]     Urobilinogen   Date Value Ref Range Status   07/15/2018 0.2 0.0 - 1.0 EU/dl Final     Nitrites   Date Value Ref Range Status   07/15/2018 Negative NEGATIVE,Negative   Final     Leukocyte Esterase    Date Value Ref Range Status   07/15/2018 Moderate (A) NEGATIVE,Negative   Final     Potassium   Date Value Ref Range Status   07/16/2018 4.3 3.5 - 5.1 mEq/L Final     Creatinine   Date Value Ref Range Status   07/16/2018 1.4 (H) 0.6 - 1.3 mg/dl Final     BUN   Date Value Ref Range Status   07/16/2018 29 (H) 7 - 25 mg/dl Final     Prostate Specific Ag   Date Value Ref Range Status   04/30/2018 0.6 0.0 - 4.0 ng/mL Final      PSA No results for input(s): PSA in the last 72 hours.   Coagulation No results  found for: PTP, INR, APTT, INREXT

## 2018-07-17 NOTE — Progress Notes (Signed)
Progress  Notes by Nils Flack, MD at 07/17/18 1148                Author: Nils Flack, MD  Service: Urology  Author Type: Physician       Filed: 07/17/18 1328  Date of Service: 07/17/18 1148  Status: Addendum          Editor: Nils Flack, MD (Physician)          Related Notes: Original Note by Nils Flack, MD (Physician) filed at 07/17/18 1324                          Urology Progress Note              Assessment/Plan:          Patient Active Problem List        Diagnosis  Code         ?  Sepsis ([place])  A41.9     ?  AKI (acute kidney injury) ([place])  N17.9         ?  UTI (urinary tract infection)  N39.0        Tommy Brown is a 73 year old male   ??   Assessment:   - Complicated UTI????- failed outpatient treatment of Levoquin??               Urine culture 07/15/18: >100 K Gram Negative Bacilli Isolated               Blood Culture 07/15/18: No Growth At 24 Hours                Note: Urine culture 06/22/18 30 K Escherichia coli with resistant pattern   ??   - Left 92mm proximal ureteral stone with hydroureteronephrosis??and 54mm LLP stone   ????????????????????????- s/p cystoscopy, left JJ stent on 06/22/2018 by Dr. Claiborne Billings    - AKI with baseline creatinine 1.5   ????????????????????????- Peak creatinine 1.8 (5/20)   ????????????????????????- Current creatinine 1.4    - Fevers??(up to 104.5)??and rigors??at home - temperature upon admission 98.5   ????????????????????????- WBC upon admission 7.7 >  8.2 07/16/18   - Made NPO yesterday - Per Dr. Loleta Books "  Renal and bladder ultrasound reviewed; mild left      hydronephrosis may just be intrarenal reflux from stent, post void bladder ultrasound did            show elevated residual of 400 mL" -  foley requested to be placed and noted to be          refused by the patient and PVR's being monitored and the patient states he is voiding  well??               07/16/18 - Pt voided 200 mL/ Bladder scan 175 mL      - Discussed with Dr. Oleta Mouse unlikely patient will be ready for procedure by Tuesday.    Will need negative repeat culture - the  patient states he does not want to remain in the hospital waiting for a negative culture and prefers to possibly go home on po antibiotic and arrange outpatient - have reviewed this with Dr. Oleta Mouse   ??   - Hx of nephrolithiasis s/p URS with LL in 02/2017   - Hx of ED with IPP   - Hx of Pancreatic cancer s/p Whipple in 2006, chemotherapy and radiation,  Plan:     Consider transition to Oral antibiotics knowing that the patient had an E coli previously and had been on Keflex and may need to switch to Augmentin or Omnicef as outpatient medication   RTC for repeat urine culture to be set up for return for stone management knowingt that his stent was placed on 06/22/18            Follow up arranged? YES  Note sent to [place]. Campbell Riches    [facility].   [place], [place]       Addendum:   Patient seen and examined independently.I have reviewed and agree with  above assessment and plan.   S/p left stent placement 06/22/18 for 20mm stone and UTI.   E. Coli UTI/sepsis with some persistent fevers.  Previously discharged on Keflex tid.  Currently on Rocephin.   Renal US 5/20 shows mild left hydro with stent in appropriate position.  PVR 408cc's and 175cc's    Patient declined foley cath.  Will increase flomax to 0.8mg  dosage.      Definitive stone surgery deferred until infection cleared, likely in 1-2 weeks.      Nils Flack, MD, FACS   07/17/2018              Subjective:        Daily Progress Note: 07/17/2018 11:48 AM      Tommy Brown is doing well but very adamant about not remaining in the hospital; any longer         Objective:        Visit Vitals      BP  91/50 (BP Patient Position: Sitting)     Pulse  78     Temp  99.7 ??F (37.6 ??C)     Resp  16     Ht  5\' 8"  (1.727 m)     Wt  68.4 kg (150 lb 12.7 oz)     SpO2  95%        BMI  22.93 kg/m??            Temp (24hrs), Avg:100.1 ??F (37.8 ??C), Min:98.7 ??F (37.1 ??C), Max:102.7 ??F (39.3 ??C)         Intake  and Output:   05/20 1901 - 05/22 0700   In: 2419 [P.O.:240; I.V.:2179]   Out: 2450 [Urine:2450]   05/22 0701 - 05/22 1900   In: -    Out: 690 [Urine:690]      PHYSICAL EXAMINATION:    Visit Vitals      BP  91/50 (BP Patient Position: Sitting)     Pulse  78     Temp  99.7 ??F (37.6 ??C)     Resp  16     Ht  5\' 8"  (1.727 m)     Wt  68.4 kg (150 lb 12.7 oz)     SpO2  95%        BMI  22.93 kg/m??        Constitutional:??Well developed, well nourished male. ??No acute distress. ??   HEENT: Normocephalic, Atraumatic, EOM's intact    CV:????no edema   Respiratory: No respiratory distress or difficulties breathing    Abdomen:????soft and non tender    GU Male:????No longer reporting   Left??CVA tenderness - mild   Skin:??No evidence of jaundice. ??Normal color   Neuro/Psych:????Alert and oriented. Affect appropriate.  MSK: normal ROM??   ??   ??   REVIEW OF LABS AND IMAGING:??   ??   Renal Ultrasound 07/15/18:   IMPRESSION:   1. Both kidneys are slightly echogenic, concerning for medical renal disease.   2. Mild left-sided hydronephrosis with a stent which appears in customary   position.   3. Small bilateral renal cysts.   ??   ??   KUB 07/15/2018   FINDINGS: Left-sided double-J catheter in place. Multiple calcifications   overlying the left kidney. Scattered arthritis lumbar spine. Previous lower   lumbar spine surgery with pedicle screws L4 and L5 level. Penile prostheses   identified. Bowel gas pattern unremarkable.         Labs:          Labs:  Results:        Chemistry      Recent Labs         07/16/18   0413  07/15/18   1405      GLU  112*  125*      NA  139  137      K  4.3  4.5      CL  111*  108*      CO2  19*  21      BUN  29*  36*      CREA  1.4*  1.8*      CA  8.4*  9.0      AGAP  8  8      AP  159*  190*      TP  7.0  8.0      ALB  2.4*  2.7*                 CBC w/Diff  Recent Labs         07/16/18   0413  07/15/18   1405      WBC  8.2  7.7      RBC  2.95*  3.16*      HGB  8.0*  8.7*      HCT  25.7*  28.2*      PLT  157  177       GRANS   --   81.8*      LYMPH   --   8.5*      EOS   --   0.7                 Cultures  No results for input(s): CULT in the last 72 hours.   All Micro Results         Procedure  Component  Value  Units  Date/Time        CULTURE, BLOOD [[id number]]  Collected:  07/15/18 1405        Order Status:  Completed  Specimen:  Peripheral blood  Updated:  07/17/18 0704          Blood Culture Result  No Growth At 24 Hours             CULTURE, BLOOD [[id number]]  Collected:  07/15/18 1442        Order Status:  Completed  Specimen:  Peripheral blood  Updated:  07/17/18 0703          Blood Culture Result  No Growth At 24 Hours             CULTURE, URINE [[id number]]  (Abnormal)  (Susceptibility)  Collected:  07/15/18  1442        Order Status:  Completed  Specimen:  Urine from Clean catch  Updated:  07/16/18 2151          Isolate  --                 >100,000 CFU/mL   Escherichia coli             COVID-19 (INPATIENT TESTING) [[id number]]  Collected:  07/15/18 1405        Order Status:  Completed  Specimen:  NASOPHARYNGEAL SWAB  Updated:  07/15/18 1537          COVID-19  NEGATIVE               Comment:  Negative results do not preclude SARS-CoV-2 infection and should not be used as the sole basis   for treatment or other patient management decisions. Negative results must be combined with   clinical observations, patient history, and epidemiological information.   Testing with the Xpert Xpress SARS-CoV-2 test is intended for use by trained operators who are   proficient in performing tests using either GeneXpert Dx, GeneXpert Infinity and/or [employer]. The Xpert Xpress SARS-CoV-2 test is only for use under the Food and Drug   Administration's Emergency Use Authorization.                                   Urinalysis  Color      Date  Value  Ref Range  Status      07/15/2018  Amber     Final         Appearance      Date  Value  Ref Range  Status      07/15/2018  Turbid     Final         Specific gravity      Date  Value   Ref Range  Status      07/15/2018  1.020  1.005 - 1.030    Final         pH (UA)      Date  Value  Ref Range  Status      07/15/2018  5.5  5 - 9    Final         Protein      Date  Value  Ref Range  Status      07/15/2018  100 (A)  NEGATIVE,Negative mg/dl  Final         Ketone      Date  Value  Ref Range  Status      07/15/2018  Negative  NEGATIVE,Negative mg/dl  Final         Bilirubin      Date  Value  Ref Range  Status      07/15/2018  Negative  NEGATIVE,Negative    Final         Blood      Date  Value  Ref Range  Status      07/15/2018  Large (A)  NEGATIVE,Negative    Final          Comment:          Operator: [id number]         Urobilinogen      Date  Value  Ref Range  Status      07/15/2018  0.2  0.0 - 1.0  EU/dl  Final         Nitrites      Date  Value  Ref Range  Status      07/15/2018  Negative  NEGATIVE,Negative    Final         Leukocyte Esterase      Date  Value  Ref Range  Status      07/15/2018  Moderate (A)  NEGATIVE,Negative    Final         Potassium      Date  Value  Ref Range  Status      07/16/2018  4.3  3.5 - 5.1 mEq/L  Final         Creatinine      Date  Value  Ref Range  Status      07/16/2018  1.4 (H)  0.6 - 1.3 mg/dl  Final         BUN      Date  Value  Ref Range  Status      07/16/2018  29 (H)  7 - 25 mg/dl  Final         Prostate Specific Ag      Date  Value  Ref Range  Status      04/30/2018  0.6  0.0 - 4.0 ng/mL  Final              PSA  No results for input(s): PSA in the last 72 hours.        Coagulation  No results found for: PTP, INR, APTT, INREXT

## 2018-07-17 NOTE — Progress Notes (Signed)
 OCCUPATIONAL THERAPY EVALUATION     Patient: Tommy Brown (73 y.o. male)  Room: 6622/6622    Primary Diagnosis: Sepsis [facility]) [A41.9]  UTI (urinary tract infection) [N39.0]       Date of Admission: 07/15/2018   Length of Stay:  2 day(s)  Insurance: Payor: [facility] / Plan: [facility] MEDICARE A & B / Product Type: Medicare /      Date: 07/17/2018  In time:  1130        Out time:  1155    Precautions:Falls   Ordered Weight Bearing Status: None    Isolation:  There are currently no Active Isolations       MDRO: No active infections        ASSESSMENT :   Based on the objective data described below, the patient presents with   - decreased activity tolerance, increased fatigue and/or shortness of breath  affecting patient's safety and independence/ability to perform basic ADLs/IADLs    Patient will benefit from skilled occupational therapy intervention to address the above impairments.    Patient's rehabilitation potential is considered to be Excellent for below stated goals.     Recommendations:  Recommend continued occupational therapy during acute stay. Recommend out of bed activity to counteract ill effects of bedrest, with assistance from staff as needed.  Discharge Recommendations: home health OT/PT  Further Equipment Recommendations for Discharge: tub transfer bench     Plan:   Patient will be followed by occupational therapy to address goals while hospitalized as patient's status and schedule permit.   Patient to be seen 1-3 visits x 2 weeks.    Planned interventions may include any combination of the following: ADI training, functional balance training, therapeutic exercise and patient/caregiver education and training    Patient and/or family have participated as able in goal setting and plan of care.  Occupational Therapy goals:     OT goals initiated 07/17/2018 and will be met by patient within 1-3 sessions     1.  Patient and wife will indicate good understanding of home safety recommendations.  2.  Patient  will tolerate UE AROM 10 reps each all joints and planes with rest breaks as needed.   3.  Patient will increase activity tolerance to good for performance of ADLs.      Education/ communication:     Barriers to Learning/Limitations:  None  Education provided to: patient and spouse on (+) role of OT, (+) OT plan of care, (+)  bathroom safety, home safety.  Reviewed: picture of safe bathroom & sitting area, adaptive equipment use  Educational Handouts issued: home safety  Patient / Family readiness to learn indicated by: verbalized understanding and showing interest    SUBJECTIVE:     Patient I feel better.    Wife reports, His energy is not there yet.  His eyes are a little sunkin.    Pain Assessment: none reported    OBJECTIVE DATA SUMMARY:     Orders, labs, and chart reviewed on Tommy Brown. Communicated with nurse.  Patient was cleared for OT eval and treat.      Patient was admitted to the hospital on 07/15/2018 with   Chief Complaint   Patient presents with   . Covid Testing   . Abnormal Lab Results     Present illness history:   Patient Active Problem List    Diagnosis Date Noted   . UTI (urinary tract infection) 07/15/2018   . AKI (acute kidney injury) (HCC)  06/22/2018   . Sepsis (HCC) 01/17/2018      Previous medical history:   Past Medical History:   Diagnosis Date   . Arthritis    . Benign prostate hyperplasia    . Bladder infection    . Chronic kidney disease    . Coronary arteriosclerosis    . Coronary atherosclerosis of artery bypass graft    . Decreased testosterone level    . Diverticular disease    . Essential hypertension    . Gout    . History of acute renal failure    . History of anemia    . History of malignant neoplasm of pancreas    . Hx of CABG    . Kidney stone    . Kidney stones    . Neuropathy    . Pancreatic cancer (HCC)    . Sepsis (HCC)    . Tremor        Patient found:     Bed    Patient received / participated in 15 minutes of treatment (therapeutic activity) and/or  educational instruction during/immediately following OT evaluation    Prior level of function / living situation status:     Information was obtained by:   patient, spouse  Home environment:   Patient lives with spouse in a 1st floor apt in [place], [place].  Family lives near by.    Prior level of function:Patient is independent with no limitations.  Prior level of Instrumental Activities of Daily Living:   Patient is independent with all IADLs.  Wife reports patient does all the cooking and cleaning, usually doing deep cleaning every Saturday.  Patient was independent with driving and grocery shopping prior to hospitalization.    Home equipment: 2 grab bars in the shower.  Has a tub/shower combination and a high toilet seat.      Cognitive status:     Mental status:   Orientation: Patient is oriented x 3  Communication: normal  Attention Span:   fair(15-81min)  Follows commands: intact  Safety/Judgement: good      Activities of daily living status:   Based on direct observation, simulation and clinical assessment.      Eating:           - independent  Grooming:     - set-up  UB bathing:   - set-up  LB bathing:   - set-up  UB dressing: - set-up  LB dressing: - set-up  Toileting:       - stand-by assistance    Comment(s):   - Patient performed sitting in chair in front of the sink.    Functional mobility status:     Mobility:  Sit to stand -  standby assistance     Transfers:  Bed to commode: standby assistance    Comment(s):   - assist for IV pole.     Functional balance status:     Static Sitting Balance -           good:       maintains balance against moderate resistance  Dynamic Sitting Balance -      good (-):  independent with basic dynamic balance activities   Static Standing Balance -       good:       maintains balance against moderate resistance  Dynamic Standing Balance -  good (-):  independent with basic dynamic balance activities     Activity Tolerance: good- tolerance to activity during  session  Upper extremity status:     Dominance:right  RIGHT: ACTIVE range of motion is WNL.  Strength is grossly graded as 5/5: (Completes full range of motion against gravity with maximum resistance).  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    LEFT:   ACTIVE range of motion is WNL.  Strength is grossly graded as 5/5: (Completes full range of motion against gravity with maximum resistance).  Comment: serial thumb opposition: all digits    Final location:     wife present, patient seated in chair..    Thank you for this referral.    Dorothyann GAILS. [place], OT      TREATMENT     OBJECTIVE:  Pt was able to stand with SBA for functional ambulation in the room.  Assist for IV pole.  He was in chair in front of the sink to perform ADLs.  He was able to groom and bathe with set up.  Patient and wife were educated on home safety recommendations.  Wife verbalizes understanding of recommendations.     ASSESSMENT:  -  Patient demonstrating good response during session.    PLAN: 1-3 visits     Dorothyann GAILS. [place], OT

## 2018-07-17 NOTE — Discharge Summary (Signed)
Discharge Summary by Kristen Loader, MD at 07/17/18 1722                Author: Kristen Loader, MD  Service: Hospitalist  Author Type: Physician       Filed: 07/17/18 1728  Date of Service: 07/17/18 1722  Status: Signed          Editor: Kristen Loader, MD (Physician)                 Physician Discharge Summary        Patient ID:   Patient Name: Tommy Brown   Medical Record Number: [id number]   Date of Birth: 21-Apr-1945   Age: 73 y.o.      Primary Care Provider: Daralene Milch, MD      Code Status: Full Code       Admit date: 07/15/2018      Discharge Date:  07/17/2018      Admitting Physician: Jonnie Kind, DO      Discharge Physician: Kristen Loader, MD      Discharge Disposition: Home      Activity: Activity as tolerated      Diet:  Cardiac Diet      Follow-up appointments:         Follow-up Information               Follow up With  Specialties  Details  Why  Contact Info              Daralene Milch, MD  Family Practice      [address] [place] [address]   [phone]                -GU: Dr Crist Infante 1-2 weeks for definitive stone surgery      -PCP: Dr Rae Lips 1 week         Patient Instructions:      Current Discharge Medication List              START taking these medications          Details        lactobacillus rhamnosus gg 10 billion cell (Culturelle) 10 billion cell capsule  Take 1 Cap by mouth Before breakfast and dinner.   Qty: 40 Cap, Refills:  0               cephalexin 500 mg tab  Take 500 mg by mouth three (3) times daily.   Qty: 42 Tab, Refills:  0                     CONTINUE these medications which have CHANGED          Details        tamsulosin (Flomax) 0.4 mg capsule  Take 2 Caps by mouth daily.   Qty: 60 Cap, Refills:  1                     CONTINUE these medications which have NOT CHANGED          Details        peg 400-propylene glycol (Systane, propylene glycol,) 0.4-0.3 % drop  Administer 2 Drops to both eyes daily.  Patient places two drops in both eyes every AM.   Indications: dry eye               metoprolol  tartrate (LOPRESSOR) 25 mg tablet  Take 25 mg by mouth daily.               gabapentin (NEURONTIN) 600 mg tablet  Take 600 mg by mouth two (2) times a day.               amLODIPine (NORVASC) 5 mg tablet  Take 5 mg by mouth daily.               allopurinol (ZYLOPRIM) 300 mg tablet  Take  by mouth daily.               lipase-protease-amylase (ZENPEP) 20,000-63,000- 84,000 unit capsule  Take 8 Caps by mouth daily.               aspirin 81 mg chewable tablet  Take 81 mg by mouth daily.               TESTOSTERONE IM  by IntraMUSCular route.                         Admission Diagnoses: Sepsis ([place]) [A41.9]   UTI (urinary tract infection) [N39.0]      Discharge Diagnoses: see below      Admission Condition: fair      Discharged Condition: good      Hospital Course:          Complicated UTI   L Ureteral stent s/p lithotripsy 05/2018   CAD s/p CABG   CKD3   HTN   Gout   Pancreatic cancer s/p Whipple/XRT.   BPH   HLD   Metabolic acidosis   ??   Plan:   ??   Fever appears to have subsided   Urology input noted and appreciated   It appears that GU will be unable to perform procedure until late next week or the following week.   Pt was on ceftriaxone 1 g IV every 24h while inpt   Patient will likely need antibiotics duration long enough to get him to the procedure date   GU increased Flomax 0.4-->0.8mg  PO daily and the pt noted less frequency 5/22   Urine Cx Grew E coli sensitive to Ancef. Pt will go home on Keflex 500mg  PO TID for 14 days.   Pt will need to be on abx until his surgery is planned so the abx may need to be renewed.    GU also noted pt will need neg UCx check in the future.   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   DVT prophylaxis    SCDs while inpt   ??   CODE STATUS.  Full code   ??   Disposition   Patient appears to be doing better.    Abx chosen for E coli   Home today and f/u with Dr Oleta Mouse in 1-2 weeks for definitive stone therapy.      ------------------      65 minutes were spent on the discharge of the patient of which more than 50% was spent in coordination of care and counseling (time spent with patient/family face to face, physical exam, reviewing laboratory and imaging investigations, speaking with physicians  and nursing staff involved in this patient's care)      ------------------  Physical exam:   General:  Alert, NAD, pleasant.  Cooperative   HEENT: Sclera anicteric, PERRL, OM moist, throat clear.   Chest:  CTA B, no wheeze, no rales, no rhonchi.  Good air movement.   CV: RRR, S1/S2 were normal, no murmur   Abdomen: NTND, soft, NABS, no masses were noted.  No rebound, no guarding.   Extremities: No edema.        Subjective:        Pt said that he feels great. No CP. RN noted low grade temps, but no fever (none >=100.45F).  We discussed E coli sensitive to Keflex for which he is to be on med 2 week or whenever cleared by GU (Dr Oleta Mouse) pt will need to f/u with Dr Oleta Mouse for definitive  stone surgery. Pt understood         Visit Vitals      BP  121/57 (BP Patient Position: Supine)     Pulse  72     Temp  99.2 ??F (37.3 ??C)     Resp  18     Ht  5\' 8"  (1.727 m)     Wt  68.4 kg (150 lb 12.7 oz)     SpO2  99%        BMI  22.93 kg/m??              Initial presentation and work-up:   [As per Dr  Webb Silversmith [facility]'s H+P from 07/15/2018]   #############   "     Chief Complaint       Patient presents with        ?  Covid Testing     ?  Abnormal Lab Results     ??   ??   ??     HPI:     Tommy Brown is a 73 y.o. year old male past medical history of coronary artery disease, hypertension, chronic kidney disease stage III, gout  who presents from home today with concern of ongoing fever, chills, Reiger's, feeling  unwell with poor appetite.  He states he was started to be treated for a UTI by his primary care physician and has been on Levaquin.  Last urine culture from April  27 grew E. coli which was resistant to Levaquin.  Patient does report dark urine, mild flank discomfort.  No nausea or vomiting but has not really eaten or drank much over the last few days.   ??   Patient had a ureteral stent placed April 2020 following laser and lithotripsy for stone.   ??   ER course: CBC reveals white blood cell count 7.7 hemoglobin 8.7 platelets 177.  Metabolic work-up shows creatinine of 1.8 insistent  with his baseline chronic kidney disease.  Urinalysis is 2+ bacteria large leukocyte greater than 50 white blood cells.  Chest x-ray unremarkable.  Coronavirus swab was negative.  Patient given 2 g of ceftriaxone.  Urology was consulted recommends  retroperitoneal ultrasound to evaluate for hydronephrosis.   ??   Review of Systems -    Review of Systems    Constitutional: Positive for chills, fever  and malaise/fatigue.    HENT: Negative for congestion, ear pain, sinus pain and sore throat.     Eyes: Negative for blurred vision and photophobia.    Respiratory: Negative for cough, shortness of breath and wheezing.     Cardiovascular: Negative for chest pain, palpitations and leg swelling.    Gastrointestinal: Negative for abdominal pain, constipation,  diarrhea, heartburn, nausea and vomiting.    Genitourinary: Positive for urgency. Negative for dysuria and flank pain.    Musculoskeletal: Negative for myalgias.    Skin: Negative for itching and rash.    Neurological: Negative for dizziness, weakness and headaches.    Endo/Heme/Allergies: Negative for polydipsia.    Psychiatric/Behavioral: Negative for depression. The patient is not nervous/anxious.     ??   ??   ??   Past Medical History:                  Past Medical History:        Diagnosis  Date         ?  Arthritis  ??     ?  Benign prostate hyperplasia  ??     ?  Bladder infection  ??      ?  Chronic kidney disease  ??     ?  Coronary arteriosclerosis  ??     ?  Coronary atherosclerosis of artery bypass graft  ??     ?  Decreased testosterone level  ??     ?  Diverticular disease  ??     ?  Essential hypertension  ??     ?  Gout  ??     ?  History of acute renal failure  ??     ?  History of anemia  ??     ?  History of malignant neoplasm of pancreas  ??     ?  Hx of CABG  ??     ?  Kidney stone  ??     ?  Kidney stones  ??     ?  Neuropathy  ??     ?  Pancreatic cancer ([place])  ??     ?  Sepsis ([place])  ??     ?  Tremor  ??     ??   ??   Past Surgical History:                     Past Surgical History:         Procedure  Laterality  Date          ?  HX ORTHOPAEDIC  ??  ??        ??  back          ?  HX OTHER SURGICAL  ??  2006        ??  whipple procedure done for pancreatic cancer          ?  HX UROLOGICAL  ??  2012        ??  Percutaneous extraction of a kidney stone w/ fragmentation procedure      ??   ??   Family History:                     Family History         Problem  Relation  Age of Onset          ?  Alcohol abuse  Father  ??     ?  Heart Disease  Brother  ??     ?  Emphysema  Brother  ??     ??   ??   Social History:   Social History               ??  Socioeconomic History      ?  Marital status:  MARRIED      ??  ??  Spouse name:  Not on file      ?  Number of children:  Not on file      ?  Years of education:  Not on file      ?  Highest education level:  Not on file      Tobacco Use      ?  Smoking status:  Never Smoker      ?  Smokeless tobacco:  Never Used      Substance and Sexual Activity      ?  Alcohol use:  Not Currently      ?  Drug use:  Not Currently      ?  Sexual activity:  Not Currently      Social History Narrative      ??  ** Merged History Encounter **      ??                ??   ??   Home Medications:                           Prior to Admission medications             Medication  Sig  Start Date  End Date  Taking?  Authorizing Provider            metoprolol tartrate (LOPRESSOR) 25 mg tablet  Take  25 mg by mouth daily.  ??  ??  ??  Other, Phys, MD     gabapentin (NEURONTIN) 600 mg tablet  Take 600 mg by mouth two (2) times a day.  ??  ??  ??  Other, Phys, MD     amLODIPine (NORVASC) 5 mg tablet  Take 5 mg by mouth daily.  ??  ??  ??  Other, Phys, MD     allopurinol (ZYLOPRIM) 300 mg tablet  Take  by mouth daily.  ??  ??  ??  Other, Phys, MD     tamsulosin (FLOMAX) 0.4 mg capsule  Take 0.4 mg by mouth daily.  ??  ??  ??  Other, Phys, MD     lipase-protease-amylase (ZENPEP) 20,000-63,000- 84,000 unit capsule  Take 8 Caps by mouth daily.  ??  ??  ??  Other, Phys, MD     tiZANidine (ZANAFLEX) 4 mg tablet  Take 4 mg by mouth nightly.  ??  ??  ??  Other, Phys, MD     fluticasone propionate 100 mcg/actuation dsdv  Take  by inhalation.  ??  ??  ??  Other, Phys, MD     aspirin 81 mg chewable tablet  Take 81 mg by mouth daily.  ??  ??  ??  Other, Phys, MD     TESTOSTERONE IM  by IntraMUSCular route.  ??  ??  ??  Other, Phys, MD     ??   ??   Allergies:                     Allergies        Allergen  Reactions         ?  Other Food  Other (comments)         ??  ??  VISTAID   ANESTHESIA         ?  Codeine  Itching and Swelling     ?  Oxycodone  Anaphylaxis     ?  Hydroxyzine Pamoate  Itching and Other (comments)         ??  ??  Other reaction(s): Unknown   ??         ?  Meperidine  Itching, Palpitations, Swelling and Other (comments)         ??  ??  Other reaction(s): Unknown   ??         ?  Metoclopramide  Itching, Other (comments) and Swelling         ??  ??  Other reaction(s): Other (See Comments)   Reaction unknown    Reaction unknown    ??         ?  Morphine  Other (comments) and Nausea and Vomiting     ?  Oxycodone-Acetaminophen  Itching     ?  Prochlorperazine  Itching, Other (comments) and Unknown (comments)         ??  ??  Other reaction(s): Unknown   Unsure of reaction   ??         ?  Hydroxyzine Hcl  Hives     ??   ??   ??     Physical Exam:     ??   Visit Vitals      BP  133/56     Pulse  66     Temp  98.5 ??F (36.9 ??C)     Resp  16     Ht  5\' 8"  (1.727 m)      Wt  59.9 kg (132 lb)     SpO2  96%     BMI  20.07 kg/m??     ??   Physical Exam   Constitutional:        General: He is not in acute distress.     Appearance: He is not diaphoretic.   HENT:       Head: Normocephalic and atraumatic.      Right Ear: External ear normal.      Left Ear: External ear normal.      Nose: Nose normal.      Mouth/Throat:      Dentition: Normal dentition.    Eyes:       General:         Right eye: No discharge.         Left eye: No discharge.      Conjunctiva/sclera: Conjunctivae normal.      Pupils: Pupils are equal, round, and reactive to light.    Neck:       Musculoskeletal: Neck supple.      Thyroid: No thyromegaly.      Vascular: No JVD.   Cardiovascular:       Rate and Rhythm: Normal rate and regular rhythm.      Heart sounds: Normal heart sounds. No murmur. No friction rub.   Pulmonary :       Effort: Pulmonary effort is normal. No respiratory distress.      Breath sounds: Normal breath sounds. No wheezing.    Abdominal:      General: Bowel sounds are normal. There is no distension.      Palpations: Abdomen is soft.      Tenderness: There is no abdominal tenderness. There is no guarding or  rebound.    Musculoskeletal: Normal range of motion.    Skin:      General: Skin  is warm and dry.   Neurological:       Mental Status: He is alert and oriented to person, place, and time.      Cranial Nerves: No cranial nerve deficit.      Coordination: Coordination normal.      Deep Tendon Reflexes: Reflexes are normal and symmetric.    Psychiatric:         Mood and Affect: Mood and affect normal.         Cognition and Memory: Memory normal.         Judgment: Judgment normal.  "   #############      ACUTE DIAGNOSES:   Sepsis ([place]) [A41.9]   UTI (urinary tract infection) [N39.0]      CHRONIC MEDICAL DIAGNOSES:      Problem List as of 07/17/2018  Date Reviewed:  07-11-2018                        Codes  Class  Noted - Resolved             UTI (urinary tract infection)  ICD-10-CM: N39.0   ICD-9-CM:  599.0    07/15/2018 - Present                       AKI (acute kidney injury) ([place])  ICD-10-CM: N17.9   ICD-9-CM: 584.9    Jul 11, 2018 - Present                       Sepsis ([place])  ICD-10-CM: A41.9   ICD-9-CM: 038.9, 995.91    01/17/2018 - Present                               Data Review:          Recent Days:     Recent Labs             07/17/18   1141  07/16/18   0413  07/15/18   1405     WBC  7.6  8.2  7.7     HGB  7.6*  8.0*  8.7*     HCT  24.2*  25.7*  28.2*          PLT  180  157  177          Recent Labs             07/17/18   1141  07/16/18   0413  07/15/18   1405     NA  135*  139  137     K  3.8  4.3  4.5     CL  110*  111*  108*     CO2  20*  19*  21     GLU  176*  112*  125*     BUN  17  29*  36*     CREA  1.2  1.4*  1.8*     CA  8.5  8.4*  9.0     MG  1.9   --    --      PHOS  3.0   --    --      ALB  2.2*  2.4*  2.7*     TBILI   --   0.8  0.7     SGOT   --   21  35          ALT   --   30  40        No results for input(s): PH, PCO2, PO2, HCO3, FIO2 in the last 72 hours.      24 Hour Results:     Recent Results (from the past 24 hour(s))     RENAL FUNCTION PANEL          Collection Time: 07/17/18 11:41 AM         Result  Value  Ref Range            Sodium  135 (L)  136 - 145 mEq/L       Potassium  3.8  3.5 - 5.1 mEq/L       Chloride  110 (H)  98 - 107 mEq/L       CO2  20 (L)  21 - 32 mEq/L       Glucose  176 (H)  74 - 106 mg/dl       BUN  17  7 - 25 mg/dl       Creatinine  1.2  0.6 - 1.3 mg/dl       GFR est AA  >60          GFR est non-AA  >60          Calcium  8.5  8.5 - 10.1 mg/dl       Albumin  2.2 (L)  3.4 - 5.0 gm/dl       Phosphorus  3.0  2.5 - 4.9 mg/dl       Anion gap  6  5 - 15 mmol/L       MAGNESIUM          Collection Time: 07/17/18 11:41 AM         Result  Value  Ref Range            Magnesium  1.9  1.6 - 2.6 mg/dl       CBC WITH AUTOMATED DIFF          Collection Time: 07/17/18 11:41 AM         Result  Value  Ref Range            WBC  7.6  4.0 - 11.0 1000/mm3       RBC  2.81 (L)  3.80 - 5.70  M/uL       HGB  7.6 (L)  12.4 - 17.2 gm/dl       HCT  24.2 (L)  37.0 - 50.0 %       MCV  86.1  80.0 - 98.0 fL       MCH  27.0  23.0 - 34.6 pg       MCHC  31.4  30.0 - 36.0 gm/dl       PLATELET  180  140 - 450 1000/mm3       MPV  11.7 (H)  6.0 - 10.0 fL       RDW-SD  59.4 (H)  35.1 - 43.9         NRBC  0  0 - 0         IMMATURE GRANULOCYTES  0.8  0.0 - 3.0 %       NEUTROPHILS  76.8 (H)  34 - 64 %       LYMPHOCYTES  13.4 (L)  28 - 48 %       MONOCYTES  7.6  1 - 13 %       EOSINOPHILS  1.3  0 - 5 %            BASOPHILS  0.1  0 - 3 %           Xr Chest Sngl V      Result Date: 07/15/2018   AP chest. Comparison 05/19/2018 INDICATION: Sepsis.       IMPRESSION: Midline sternotomy. Cardiac silhouette normal. Lungs clear of any acute process.       Xr Abd (kub)      Result Date: 07/15/2018   Supine abdomen. INDICATION: Fever. FINDINGS: Left-sided double-J catheter in place. Multiple calcifications overlying the left kidney. Scattered arthritis lumbar spine. Previous lower lumbar spine surgery with pedicle screws L4 and L5 level. Penile prostheses  identified. Bowel gas pattern unremarkable.       IMPRESSION: Findings as described above       Ct Abd Pelv Wo Cont      Result Date: 06/22/2018   CT Abdomen and Pelvis without Indication: Left hydronephrosis. Evaluate for obstructing stone or lesion. Comparison: Ultrasound 06/22/2018. CT 05/21/2018. TECHNIQUE: CT of the abdomen and pelvis WITHOUT intravenous contrast. Coronal and sagittal reformations  were obtained.    All CT exams at this facility use one or more dose reduction techniques including automatic exposure control, mA/kV adjustment per patient's size, or iterative reconstruction technique. DICOM format imaged data is available to non-affiliated  external healthcare facilities or entities on a secure, media free, reciprocal searchable basis with patient authorization for 12 months following the date of the study. DISCUSSION: ABSENCE OF INTRAVENOUS CONTRAST DECREASES  SENSITIVITY FOR DETECTION OF  FOCAL LESIONS AND VASCULAR PATHOLOGY. LOWER THORAX: Coronary artery calcifications. HEPATOBILIARY: Multiple subcentimeter foci of hypoattenuation, too small to characterize but unchanged. Cholecystectomy. Left intrahepatic pneumobilia, unchanged. SPLEEN:  No splenomegaly. PANCREAS: Postoperative changes from prior Whipple procedure. No mass or duct dilatation in the residual pancreatic tail. ADRENALS: No adrenal nodules. KIDNEYS/URETERS: Multiple bilateral renal cysts. Multiple small (2 to 4 mm) nonobstructing  bilateral renal calculi. There is a 10 x 9 mm nonobstructing calculus in the lower pole of the left kidney. Moderate left hydronephrosis and hydroureter. There is a 8 x 7 mm obstructing calculus in the proximal left ureter. No right hydronephrosis. No  suspicious renal lesions. PELVIC ORGANS/BLADDER: Enlarged prostate. Penile implant. The bladder is unremarkable. PERITONEUM / RETROPERITONEUM: No free air or fluid. LYMPH NODES: No lymphadenopathy. VESSELS: Extensive atherosclerotic vascular calcifications.  Infrarenal abdominal aorta ectasia. GI TRACT: No distention or wall thickening. Diverticulosis. Normal appendix. BONES AND SOFT TISSUES: Posterior lumbar fixation at L4-5. Mild retrolisthesis at L2-3 and L3-4. Moderate to advanced disc height loss at  L2-3. Mild spondylosis of the remaining lumbar spine.       IMPRESSION: 1.  Obstructing 8 mm calculus in the left proximal ureter causing moderate left hydronephrosis. 2.  Multiple additional nonobstructing bilateral renal calculi. 3.  Multiple bilateral renal cysts. 4.  Diverticulosis. 5.  Postoperative changes  from prior Whipple procedure. 6.  Multiple hypoattenuating liver lesions, unchanged.       [place] Retroperitoneum Comp      Result Date: 07/15/2018   Examination: Ultrasound retroperitoneum complete INDICATION: Renal ultrasound for stent. FINDINGS: Exam limited due to patient body habitus and motion. RRT called on patient. The  right kidney roughly measures 10.5 x 7.0 x 4.3 cm there are multiple cysts  are identified within the right kidney, measuring up to 1.9 cm within the midpole.  No hydronephrosis. Renal parenchyma appears mildly echogenic. No focal mass. The left kidney roughly measures 4.4 x 8.4 x 6.2 cm. Multiple cysts are identified within the  left kidney, measuring up to 2.4 x 2.9 cm within the midpole. There is mild hydronephrosis with the AP dimension of the pelvis measuring 2.1 cm. Renal parenchyma slightly echogenic. No mass. Prevoid urinary bladder volume measures 568 mL. Post void volume  measures 408 mL. Stent is identified within the urinary bladder and the left kidney. Prostate volume measures 31.18 mL per       IMPRESSION: 1. Both kidneys are slightly echogenic, concerning for medical renal disease. 2. Mild left-sided hydronephrosis with a stent which appears in customary position. 3. Small bilateral renal cysts.       [place] Retroperitoneum Comp      Result Date: 06/22/2018   INDICATION: AKI with history of renal stones;    COMPARISON: CT 05/21/2018 and MRI 01/19/2018. TECHNIQUE: Renal Ultrasound FINDINGS: Right Kidney: Measures 9.6 x 4.3 x 6.4 cm. There is good corticomedullary differentiation. No hydronephrosis. Multiple  echogenic foci in the renal pelvis, consistent with calculi, the largest measuring 0.4 cm. Multiple simple cysts, the largest cyst in the mid kidney measuring 0.4 x 0.5 x 0.4 cm. No solid mass. Left Kidney: Measures 11.0 x 4.4 x 6.6 cm. There is good  corticomedullary differentiation. Moderate hydronephrosis and proximal hydroureter. Multiple echogenic foci in the renal pelvis, consistent with calculi, the largest measuring 0.6 cm. Multiple simple cysts, the largest cyst in the mid kidney measuring  1.8 x 1.7 x 1.9 cm. No solid mass. Bladder: Unremarkable.       IMPRESSION: 1.  Left hydronephrosis. 2.  Multiple bilateral nonobstructing renal calculi. 3.  Multiple bilateral simple cysts.       Xr Retro  Urethrocystography      Result Date: 06/22/2018   INDICATION: L retrograde / L stent placement     PROCEDURE: XR RETRO URETHROCYSTOGRAPHY COMPARISON: None FLUOROSCOPIC TIME: 7.5 seconds. FINDINGS: Left retrograde, left stent placement.       IMPRESSION: Left retrograde, left stent placement.          Microbiology:     All Micro Results               Procedure  Component  Value  Units  Date/Time           CULTURE, BLOOD [[id number]]  Collected:  07/15/18 1405            Order Status:  Completed  Specimen:  Peripheral blood  Updated:  07/17/18 0704                Blood Culture Result  No Growth At 24 Hours                CULTURE, BLOOD [[id number]]  Collected:  07/15/18 1442            Order Status:  Completed  Specimen:  Peripheral blood  Updated:  07/17/18 0703                Blood Culture Result  No Growth At 24 Hours                CULTURE, URINE [[id number]]  (Abnormal)  (Susceptibility)  Collected:  07/15/18 1442            Order Status:  Completed  Specimen:  Urine from Clean catch  Updated:  07/16/18 2151  Isolate  --                    >100,000 CFU/mL   Escherichia coli                COVID-19 (INPATIENT TESTING) [[id number]]  Collected:  07/15/18 1405            Order Status:  Completed  Specimen:  NASOPHARYNGEAL SWAB  Updated:  07/15/18 1537                COVID-19  NEGATIVE                  Comment:  Negative results do not preclude SARS-CoV-2 infection and should not be used as the sole basis   for treatment or other patient management decisions. Negative results must be combined with   clinical observations, patient history, and epidemiological information.   Testing with the Xpert Xpress SARS-CoV-2 test is intended for use by trained operators who are   proficient in performing tests using either GeneXpert Dx, GeneXpert Infinity and/or [employer]. The Xpert Xpress SARS-CoV-2 test is only for use under the Food and Drug   Administration's Emergency Use Authorization.                               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9   ICD-9-CM: 038.9, 995.91    01/17/2018 - Present                          Signed:   Kristen Loader, MD   07/17/2018

## 2018-07-17 NOTE — Progress Notes (Signed)
 To return to PLOF:  Pt will be S with all bed mobility  Pt with be S with all transfers,  Pt will ambulate x100' with LRAD and S  Pt will be able to negotiate 3 steps with S  Pt will increase B LE strength by 1/2 grade  Pt will be S with HEP  Pt pain will be less than 5/10  PHYSICAL THERAPY EVALUATION    Patient: Tommy Brown (73 y.o. male)  Room: 6622/6622    Date: 07/17/2018  Start Time:  825  End Time:  850    Primary Diagnosis: Sepsis (HCC) [A41.9]  UTI (urinary tract infection) [N39.0]         Precautions: Falls.  Weight bearing precautions: None      ASSESSMENT :  Based on the objective data described below, the patient presents with  Able to ambulate with 1 person assist in room, needs to be OOB daily  Generalized muscle weakness affecting function   Decreased Strength  Decreased ADL/Functional Activities  Decreased Transfer Abilities  Decreased Ambulation Ability/Technique  Decreased Balance  Decreased Activity Tolerance.      Patient's rehabilitation potential is considered to be Good     Recommendations:  Physical Therapy  Discharge Recommendations: Home with home health PT and TBD, pending progress  Further Equipment Recommendations for Discharge: None        PLAN :  Planned Interventions:  Functional mobility training Gait Training Stair training Balance Training Therapeutic exercises Therapeutic activities Neuro muscular re-education AD training Patient/caregiver education      Frequency/Duration: Patient will be followed by physical therapy  1-5 times/week for 2 weeks to address goals.                 Orders reviewed, chart reviewed on [name].    SUBJECTIVE:   Patient: agreed to PT    OBJECTIVE DATA SUMMARY:   Present illness history:   Problem List  Date Reviewed: 07/22/2018          Codes Class Noted    UTI (urinary tract infection) ICD-10-CM: N39.0  ICD-9-CM: 599.0  07/15/2018        AKI (acute kidney injury) (HCC) ICD-10-CM: N17.9  ICD-9-CM: 584.9  07-22-2018        Sepsis (HCC)  ICD-10-CM: A41.9  ICD-9-CM: 038.9, 995.91  01/17/2018             Past Medical history:   Past Medical History:   Diagnosis Date   . Arthritis    . Benign prostate hyperplasia    . Bladder infection    . Chronic kidney disease    . Coronary arteriosclerosis    . Coronary atherosclerosis of artery bypass graft    . Decreased testosterone level    . Diverticular disease    . Essential hypertension    . Gout    . History of acute renal failure    . History of anemia    . History of malignant neoplasm of pancreas    . Hx of CABG    . Kidney stone    . Kidney stones    . Neuropathy    . Pancreatic cancer (HCC)    . Sepsis (HCC)    . Tremor        Prior Level of Function/Home Situation:   Home environment: Lives with Family, 1 story.  Prior level of function: independent without limitations and Independent with ambulation  Home equipment: None      Patient  found: Bed and IV.    Pain Assessment before PT session: None reported  Pain Location:   Pain Assessment after PT session: None reported  Pain Location:    []           Yes, patient had pain medications  []           No, Patient has not had pain medications  []           Nurse notified    COGNITIVE STATUS:     Mental Status: Oriented x3.  Communication: normal.  Follows commands: intact.  General Cognition: intact .  Hearing: grossly intact.  Vision:  grossly intact.      EXTREMITIES ASSESSMENT:      Tone & Sensation:   Normal    Proprioception:   Intact    Strength:    Grossly 4-/5 B LE      Range Of Motion:  WFL      Functional mobility and balance status:     Supine to sit -  contact guard  Sit to Supine -  contact guard  Sit to Stand -  contact guard  Stand to Sit -  contact guard        Balance:   Static Sitting Balance -  good-  Dynamic Sitting Balance -  good-  Static Standing Balance -  fair  Dynamic Standing Balance -  fair-        Ambulation/Gait Training:  steady, unsteady, reciprocal, decreased cadence and decreased step height/length None contact guard, min  assist, verbal cues, safety concerns, increased time and 1 person  28 feet      Therapeutic Exercises:    Patient received/participated in 10 minutes of treatment (therapeutic exercises/activities) immediately following evaluation and/or educational instruction during/immediately following PT evaluation.        Activity Tolerance:   fair-    Final Location:   bedside chair, chair alarm, all needs close, agrees to call for assistance and nurse notified     COMMUNICATION/EDUCATION:   Education: Patient, Benefit of activity while hospitalized, Call for assistance, Out of bed 2-3 times/day, Staff assistance with mobility, Changes positions frequently, Sit out of bed for 45-60 minutes or as tolerated, Safety, Functional mobility, Demonstrates adequately, Verbalized understanding, Teaching method, Verbal and Role of PT  Barriers to Learning/Limitations: None    Please refer to care plan and patient education section for further details.    Thank you for this referral.  Cleatus Lemond Molt, PT, DPT  Pager [phone]

## 2018-07-17 NOTE — Progress Notes (Signed)
Patient admitted on 07/15/2018 from home via ED with   Chief Complaint   Patient presents with   . Covid Testing   . Abnormal Lab Results          The patient is being treated for    PMH:   Past Medical History:   Diagnosis Date   . Arthritis    . Benign prostate hyperplasia    . Bladder infection    . Chronic kidney disease    . Coronary arteriosclerosis    . Coronary atherosclerosis of artery bypass graft    . Decreased testosterone level    . Diverticular disease    . Essential hypertension    . Gout    . History of acute renal failure    . History of anemia    . History of malignant neoplasm of pancreas    . Hx of CABG    . Kidney stone    . Kidney stones    . Neuropathy    . Pancreatic cancer (HCC)    . Sepsis (HCC)    . Tremor         Treatment Team: Treatment Team: Attending Provider: Alvy Bimler, MD; Consulting Provider: Other, Phys, MD; Consulting Provider: Richarda Overlie, DO; Consulting Provider: Given, Belia Heman, MD; Care Manager: Nolon Bussing; Occupational Therapist: Tera Mater., OT; Physical Therapist: Darnelle Going, PT      The patient has been admitted to the hospital 2  times in the past 12 months.    Previous 4 Admission Dates Admission and Discharge Diagnosis Interventions Barriers Disposition   4/27-06/26/2018 AKI   home   11/23-11/27/2019   Sepsis   home                   Patient and Family/Caregivers Goals of Care: Patient states he is tired of dealing with kidney stone issues and needs someone to figure it out and fix it so he can get better.    Caregivers Participating in Plan of Care/Discharge Plan with the patient: Patient alone    Tentative dc plan: Home with home health    Anticipated DME needs for discharge: Denies unmet needs.  CM to reassess after therapy evals    PRESCREENING COMPLETED FOR SNF No      Does the patient have appropriate clothing available to be worn at discharge? Yes      The patient and care participants are willing to travel NA area for discharge  facility.  The patient and plan of care participants have been provided with a list of all available Rehab Facilities or Home Health agencies as applicable. CM will follow up with a list of facilities or agencies that are offer acceptance.    CM has disclosed any financial interest that [facility] may have with any facility or agency.    Anticipated Discharge Date: 07/21/2018    The plan of care and discharge plan has been discussed with Alvy Bimler, MD and all other appropriate providers and adjusted per interdisciplinary team recommendations and in discussion with the patient and the patient designated Care Plan Participants.    Barriers to Healthcare Success/ Readmission Risk Factors: risk for infection, risk for fall/injury, risk for complications r/t medical conditions (cardiac, kidney, hx pancreatic cancer)    Consults:  Palliative Care Consult Recommended: No  Transitional Care Clinic Referral: No  Transitional Nurse Navigator Referral: No  Oncology Navigator Referral: No  SW consulted: No  [facility]  Consulted: No  Outside Hospital/Community Resources Referrals and Collaboration: Will refer to [facility]    Food/Nutrition Needs:   No unmet needs                   Dietician Consulted: No    RRAT Score: Medium Risk            14       Total Score        3 Has Seen PCP in Last 6 Months (Yes=3, No=0)    2 Married. Living with Significant Other. Assisted Living. LTAC. SNF. or   Rehab    4 IP Visits Last 12 Months (1-3=4, 4=9, >4=11)    5 Pt. Coverage (Medicare=5 , Medicaid, or Self-Pay=4)        Criteria that do not apply:    Patient Length of Stay (>5 days = 3)    Charlson Comorbidity Score (Age + Comorbid Conditions)           PCP: Cleatis Polka, MD . How do you get to your doctor appointments? Drives    Specialists: "I see urology, neurology, and many more."    Dialysis Unit: NA    Pharmacy: [facility]. Are there any medications that you  have trouble paying for? No  Any difficulty getting your medications? No    DME available at Home:Walker    Home O2 L Flow:  No            Home O2 Provider: No    Home Environment and Prior Level of Function: Lives at [address]  [address]  [place] [address] @HOMEPHONE @. Lives with wife in 1st floor apartment with no steps for entry.  Responsibilities at home include adl/iadls.    Prior to admission open services: None      Extended Emergency Contact Information  Primary Emergency Contact: GAMMON,MARCIA  Mobile Phone: [phone]  Relation: None     Transportation: Wife will transport home    Therapy Recommendations:    OT = Pending     PT = Pending     SLP =  NA     RT Home O2 Evaluation =  NA    Wound Care =  NA    Case Management Assessment    ABUSE/NEGLECT SCREENING   Physical Abuse/Neglect: Denies   Sexual Abuse: Denies   Sexual Abuse: Denies   Other Abuse/Issues: Denies          PRIMARY DECISION MAKER    Patient makes own decisions                               CARE MANAGEMENT INTERVENTIONS   Readmission Interview Completed: Yes   PCP Verified by CM: Yes([facility])   Last Visit to PCP: (2-3 weeks ago)   Palliative Care Criteria Met (RRAT>21 & CHF Dx)?: No   Mode of Transport at Discharge: Other (see comment)(Family)       Transition of Care Consult (CM Consult): Home Health               Discharge Durable Medical Equipment: No(States he has walker at home, does not need anything else.  CM to follow up based on therapy recommendations.)   Physical Therapy Consult: Yes   Occupational Therapy Consult: Yes   Speech Therapy Consult: No   Current Support Network: Lives with Spouse   Reason for Referral: DCP Rounds  History Provided By: Patient, Medical Record   Patient Orientation: Alert and Oriented, Person, Place, Situation, Self   Cognition: Alert   Support System Response: Concerned, Cooperative   Previous Living Arrangement: Lives with Family Independent   Home Accessibility: Other (see  comment)(1st floor apartment, no steps for entry)   Prior Functional Level: Independent in ADLs/IADLs   Current Functional Level: Assistance with the following:, Bathing, Dressing, Toileting, Mobility, Shopping, Housework, Cooking(Assist for safety r/t weakness, fall risk)       Can patient return to prior living arrangement: Yes   Ability to make needs known:: Good   Family able to assist with home care needs:: Yes   Pets: None           Types of Needs Identified: Disease Management Education, Treatment Education, Emotional Support   Anticipated Discharge Needs: Home Health Services   Confirm Follow Up Transport: Family                  DISCHARGE LOCATION   Discharge Placement: Home with home health

## 2018-07-21 LAB — CULTURE, BLOOD
Blood Culture Result: NO GROWTH
Blood Culture Result: NO GROWTH

## 2018-07-21 LAB — CULTURE, BLOOD 1
BLOOD CULTURE RESULT: NO GROWTH
BLOOD CULTURE RESULT: NO GROWTH

## 2018-07-22 DIAGNOSIS — E291 Testicular hypofunction: Secondary | ICD-10-CM | POA: Diagnosis not present

## 2018-07-22 DIAGNOSIS — N390 Urinary tract infection, site not specified: Secondary | ICD-10-CM | POA: Diagnosis not present

## 2018-07-22 DIAGNOSIS — F5101 Primary insomnia: Secondary | ICD-10-CM | POA: Diagnosis not present

## 2018-07-22 DIAGNOSIS — Z09 Encounter for follow-up examination after completed treatment for conditions other than malignant neoplasm: Secondary | ICD-10-CM | POA: Diagnosis not present

## 2018-07-24 ENCOUNTER — Inpatient Hospital Stay: Admit: 2018-07-24 | Discharge: 2018-07-24 | Payer: MEDICARE | Primary: Family Medicine

## 2018-07-24 ENCOUNTER — Inpatient Hospital Stay: Admit: 2018-07-24 | Payer: MEDICARE | Attending: Urology | Primary: Family Medicine

## 2018-07-24 DIAGNOSIS — Z01818 Encounter for other preprocedural examination: Secondary | ICD-10-CM

## 2018-07-24 DIAGNOSIS — N201 Calculus of ureter: Secondary | ICD-10-CM | POA: Diagnosis not present

## 2018-07-24 DIAGNOSIS — Z1159 Encounter for screening for other viral diseases: Secondary | ICD-10-CM | POA: Diagnosis not present

## 2018-07-25 LAB — CULTURE, URINE
CULTURE RESULT: NO GROWTH
Culture result: NO GROWTH

## 2018-07-26 LAB — COVID-19
SARS-CoV-2 RNA: NOT DETECTED
Sars-Cov-2 RNA: NOT DETECTED

## 2018-07-28 NOTE — Other (Addendum)
PSAT note: Pre-op instructions reviewed with patient.  Patient to take Metoprolol and Norvasc DOS with a sip of water only. Patient to be NPO after midnight.  Patients wife to drive patient home after surgery.

## 2018-07-28 NOTE — Other (Signed)
Message sent to [facility] regarding this patients lab work from 07/17/18.

## 2018-07-28 NOTE — Interval H&P Note (Signed)
Message sent to [facility] regarding this patients lab work from 07/17/18.

## 2018-07-28 NOTE — Interval H&P Note (Signed)
PSAT note: Pre-op instructions reviewed with patient.  Patient to take Metoprolol and Norvasc DOS with a sip of water only. Patient to be NPO after midnight.  Patients wife to drive patient home after surgery.

## 2018-07-30 NOTE — H&P (Signed)
ASSESSMENT:     - Complicated UTI????- failed outpatient treatment of Levoquin??  ????????????????????????Urine culture 07/15/18:??>100??K??E. Coli  ????????????????????????Blood Culture 07/15/18: No Growth At 24 Hours   ????????????????????????Note: Urine culture 06/22/18??30??K??Escherichia coli??with resistant pattern   Repeat urine culture 07/24/18 NG  ??  - Left 61mm proximal ureteral stone with hydroureteronephrosis??and 62mm LLP stone  ????????????????????????- s/p cystoscopy, left JJ stent on 06/22/2018 by Dr. Claiborne Billings   - AKI with baseline creatinine 1.5  ????????????????????????- Peak creatinine 2.6 (06/22/18)  ????????????????????????- Current creatinine 1.2  ????????????????????????07/16/18 - Pt voided 200 mL/??Bladder scan 175 mL  ??  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      PLAN:    Scheduled for cysto/retrogrades/left URS/laser litho/L stent change.  Risks and benefits explained to patient.          Nils Flack, MD    [phone]      CC:  Left kidney stone    HISTORY OF PRESENT ILLNESS:        Tommy Brown is a 73 y.o. male who is seen in consultation as referred by Dr. Forde Radon, MD for AKI with history of renal stones. Patient was initially seen by telemed visit   in our office. At that time, patient had passed stone, but was not feeling well. Labs were obtained showing hyperkalemia (6.0) and AKI (creatinine 3.6). He was taking a large amount of Toradol prescribed by someone else. Today, Dr. Aurelio Jew called patient and advised to come into the ER for further evaluation. He has been feeling better and no flank pain. He states his pain was sharp on left lower quadrant. He did pass a kidney stone and since then pain has been better.   ??  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 Stone was 95%CaOx. 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        Underwent cysto/L stent placement 06/23/18 for E. Coli UTI, AKI/hyperkalemia and 72mm obstructing left proximal ureteral stone and multiple bilateral renal stones.  Patient was discharged home on 06/26/18 but readmitted again on 07/15/18 for E. Coli UTI.  Repeat renal US 07/15/18: Both kidneys are slightly echogenic, concerning for medical renal disease.  Mild left-sided hydronephrosis with a stent which appears in customary  Position.   Small bilateral renal cysts.  AKI improved with creatinine 1.2 on 07/17/18.  Repeat urine cutlure 07/24/18 NG.             Past Medical History:   Diagnosis Date   ??? Arthritis    ??? Benign prostate hyperplasia    ??? Bladder infection    ??? Chronic kidney disease    ??? Coronary arteriosclerosis    ??? Coronary atherosclerosis of artery bypass graft    ??? Decreased testosterone level    ??? Diverticular disease    ??? Essential hypertension    ??? Gout    ??? History of acute renal failure    ??? History of anemia    ??? History of malignant neoplasm of pancreas    ??? Hx of CABG    ??? Kidney stone    ??? Kidney stones    ??? Neuropathy    ??? Pancreatic cancer ([place])    ??? Sepsis ([place])    ???  Tremor        Past Surgical History:   Procedure Laterality Date   ??? HX CERVICAL FUSION     ??? HX COLONOSCOPY     ??? HX CORONARY ARTERY BYPASS GRAFT  1995   ??? HX HEART CATHETERIZATION  2006   ??? HX ORTHOPAEDIC      back   ??? HX OTHER SURGICAL  2006    whipple procedure done for pancreatic cancer   ??? HX UROLOGICAL  2012    Percutaneous extraction of a kidney stone w/ fragmentation procedure        Social History     Tobacco Use   ??? Smoking status: Never Smoker   ??? Smokeless tobacco: Never Used   Substance Use Topics   ??? Alcohol use: Not Currently   ??? Drug use: Not Currently       Allergies   Allergen Reactions   ??? Other Food Other (comments)     VISTAID  ANESTHESIA   ??? Codeine Itching and Swelling   ??? Oxycodone Anaphylaxis   ??? Hydroxyzine Pamoate Itching and Other (comments)     Other reaction(s): Unknown      ??? Meperidine Itching, Palpitations, Swelling and Other (comments)     Other reaction(s): Unknown     ??? Metoclopramide Itching, Other (comments) and Swelling     Other reaction(s): Other (See Comments)  Reaction unknown   Reaction unknown      ??? Morphine Other (comments) and Nausea and Vomiting   ??? Oxycodone-Acetaminophen Itching   ??? Prochlorperazine Itching, Other (comments) and Unknown (comments)     Other reaction(s): Unknown  Unsure of reaction     ??? Hydroxyzine Hcl Hives       Family History   Problem Relation Age of Onset   ??? Alcohol abuse Father    ??? Heart Disease Brother    ??? Emphysema Brother        Current Outpatient Medications   Medication Sig Dispense Refill   ??? lipase-protease-amylase (Creon) 24,000-76,000 -120,000 unit capsule Take  by mouth.     ??? lipase-protease-amylase (Creon) 24,000-76,000 -120,000 unit capsule Take  by mouth daily as needed (with snacks and meals).     ??? temazepam (RESTORIL) 7.5 mg capsule Take  by mouth nightly.     ??? tamsulosin (Flomax) 0.4 mg capsule Take 2 Caps by mouth daily. 60 Cap 1   ??? lactobacillus rhamnosus gg 10 billion cell (Culturelle) 10 billion cell capsule Take 1 Cap by mouth Before breakfast and dinner. 40 Cap 0   ??? cephalexin 500 mg tab Take 500 mg by mouth three (3) times daily. 42 Tab 0   ??? peg 400-propylene glycol (Systane, propylene glycol,) 0.4-0.3 % drop Administer 2 Drops to both eyes daily. Patient places two drops in both eyes every AM.   Indications: dry eye     ??? metoprolol tartrate (LOPRESSOR) 25 mg tablet Take 25 mg by mouth daily.     ??? gabapentin (NEURONTIN) 600 mg tablet Take 600 mg by mouth two (2) times a day.     ??? amLODIPine (NORVASC) 5 mg tablet Take 5 mg by mouth daily.     ??? allopurinol (ZYLOPRIM) 300 mg tablet Take  by mouth daily.     ??? aspirin 81 mg chewable tablet Take 81 mg by mouth daily.     ??? TESTOSTERONE IM by IntraMUSCular route.         Review of Systems    Negative for: Ophthalmologic issues,  ENT issues, Cardiovascular issues, respiratory issues, GI issues, neurologic issues, hematoogic issues, skin lesions, musculoskeletal issues, psychiatric issues          PHYSICAL EXAMINATION:   Visit Vitals  Ht 5\' 8"  (1.727 m)   Wt 61.7 kg (136 lb)   BMI 20.68 kg/m??     Constitutional: Well developed, well nourished male.  No acute distress.    HEENT: Normocephalic, Atraumatic, EOM's intact   CV:  no edema  Respiratory: No respiratory distress or difficulties breathing   Abdomen:  soft and non tender   GU Male:  No CVA tenderness  DRE:  Deferred  SCROTUM:  No scrotal rash or lesions noticed.  Normal bilateral testes and epididymis.   PENIS: Urethral meatus normal in location and size. No urethral discharge.  Circumsized   Skin: No evidence of jaundice.  Normal color  Neuro/Psych:  Alert and oriented. Affect appropriate.   Lymphatic:   No enlarged inguinal lymph nodes.        REVIEW OF LABS AND IMAGING:      Labs: Results:   Chemistry  No results for input(s): GLU, NA, K, CL, CO2, BUN, CREA, CA, AGAP, BUCR, TBIL, AP, TP, ALB, GLOB, AGRAT in the last 72 hours.    No lab exists for component: GPT   CBC w/Diff No results for input(s): WBC, RBC, HGB, HCT, PLT, GRANS, LYMPH, EOS, HGBEXT, HCTEXT, PLTEXT in the last 72 hours.   Cultures No results for input(s): CULT in the last 72 hours.  All Micro Results     None            Urinalysis Color   Date Value Ref Range Status   07/15/2018 Amber   Final     Appearance   Date Value Ref Range Status   07/15/2018 Turbid   Final     Specific gravity   Date Value Ref Range Status   07/15/2018 1.020 1.005 - 1.030   Final     pH (UA)   Date Value Ref Range Status   07/15/2018 5.5 5 - 9   Final     Protein   Date Value Ref Range Status   07/15/2018 100 (A) NEGATIVE,Negative mg/dl Final     Ketone   Date Value Ref Range Status   07/15/2018 Negative NEGATIVE,Negative mg/dl Final     Bilirubin   Date Value Ref Range Status    07/15/2018 Negative NEGATIVE,Negative   Final     Blood   Date Value Ref Range Status   07/15/2018 Large (A) NEGATIVE,Negative   Final     Comment:     Operator: [id number]     Urobilinogen   Date Value Ref Range Status   07/15/2018 0.2 0.0 - 1.0 EU/dl Final     Nitrites   Date Value Ref Range Status   07/15/2018 Negative NEGATIVE,Negative   Final     Leukocyte Esterase   Date Value Ref Range Status   07/15/2018 Moderate (A) NEGATIVE,Negative   Final     Potassium   Date Value Ref Range Status   07/17/2018 3.8 3.5 - 5.1 mEq/L Final     Creatinine   Date Value Ref Range Status   07/17/2018 1.2 0.6 - 1.3 mg/dl Final     BUN   Date Value Ref Range Status   07/17/2018 17 7 - 25 mg/dl Final     Prostate Specific Ag   Date Value Ref Range Status   04/30/2018 0.6 0.0 - 4.0 ng/mL  Final      PSA No results for input(s): PSA in the last 72 hours.   Coagulation No results found for: PTP, INR, APTT, INREXT

## 2018-07-30 NOTE — H&P (Signed)
H&P by Nils Flack, MD at 07/30/18  1727                Author: Nils Flack, MD  Service: Urology  Author Type: Physician       Filed: 07/30/18 1739  Date of Service: 07/30/18 1727  Status: Signed          Editor: Nils Flack, MD (Physician)                                 ASSESSMENT:       - Complicated UTI????- failed outpatient treatment of Levoquin??   ????????????????????????Urine culture 07/15/18:??>100??K??E. Coli   ????????????????????????Blood Culture 07/15/18: No Growth At 24 Hours    ????????????????????????Note: Urine culture 06/22/18??30??K??Escherichia coli??with resistant pattern    Repeat urine culture 07/24/18 NG   ??   - Left 17mm proximal ureteral stone with hydroureteronephrosis??and 26mm LLP stone   ????????????????????????- s/p cystoscopy, left JJ stent on 06/22/2018 by Dr. Claiborne Billings    - AKI with baseline creatinine 1.5   ????????????????????????- Peak creatinine 2.6 (06/22/18)   ????????????????????????- Current creatinine 1.2   ????????????????????????07/16/18 - Pt voided 200 mL/??Bladder scan 175 mL   ??   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         PLAN:     Scheduled for cysto/retrogrades/left URS/laser litho/L stent change.  Risks and benefits explained to patient.               Nils Flack, MD      [phone]         CC:  Left kidney stone      HISTORY OF PRESENT ILLNESS:          Tommy Brown is a 73 y.o. male who is seen in consultation as referred by Dr. Forde Radon, MD for AKI with history  of renal stones. Patient was initially seen by telemed visit   in our office. At that time, patient had passed stone, but was not feeling well. Labs were obtained showing hyperkalemia (6.0) and AKI (creatinine 3.6). He was taking a large amount of Toradol  prescribed by someone else. Today, Dr. Aurelio Jew called patient and advised to come into the ER for further evaluation. He has been feeling better and no flank pain. He states his pain was sharp  on left lower quadrant. He did pass a kidney stone and since  then pain has been better.    ??   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 Stone was 95%CaOx. 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         Underwent cysto/L stent placement 06/23/18 for E. Coli UTI, AKI/hyperkalemia and 40mm obstructing left proximal ureteral stone and multiple bilateral renal stones.  Patient was discharged home on 06/26/18  but readmitted again on 07/15/18 for E. Coli UTI.  Repeat renal US 07/15/18: Both kidneys are slightly echogenic, concerning for medical renal disease.  Mild left-sided hydronephrosis with a stent which  appears in customary   Position.   Small bilateral renal cysts.  AKI improved with creatinine 1.2 on 07/17/18.  Repeat urine cutlure 07/24/18 NG.  Past Medical History:        Diagnosis  Date         ?  Arthritis       ?  Benign prostate hyperplasia       ?  Bladder infection       ?  Chronic kidney disease       ?  Coronary arteriosclerosis       ?  Coronary atherosclerosis of artery bypass graft       ?  Decreased testosterone level       ?  Diverticular disease       ?  Essential hypertension       ?  Gout       ?  History of acute renal failure       ?  History of anemia       ?  History of malignant neoplasm of pancreas       ?  Hx of CABG       ?  Kidney stone       ?  Kidney stones       ?  Neuropathy       ?  Pancreatic cancer ([place])       ?  Sepsis ([place])           ?  Tremor               Past Surgical History:         Procedure  Laterality  Date          ?  HX CERVICAL FUSION         ?  HX COLONOSCOPY         ?  HX CORONARY ARTERY BYPASS GRAFT    1995     ?  HX HEART CATHETERIZATION    2006     ?  HX ORTHOPAEDIC              back          ?  HX OTHER SURGICAL    2006          whipple procedure done for pancreatic cancer          ?  HX UROLOGICAL    2012          Percutaneous extraction of a kidney stone w/ fragmentation  procedure              Social History          Tobacco Use         ?  Smoking status:  Never Smoker         ?  Smokeless tobacco:  Never Used       Substance Use Topics         ?  Alcohol use:  Not Currently         ?  Drug use:  Not Currently             Allergies        Allergen  Reactions         ?  Other Food  Other (comments)             VISTAID   ANESTHESIA         ?  Codeine  Itching and Swelling     ?  Oxycodone  Anaphylaxis     ?  Hydroxyzine Pamoate  Itching and Other (comments)  Other reaction(s): Unknown            ?  Meperidine  Itching, Palpitations, Swelling and Other (comments)             Other reaction(s): Unknown            ?  Metoclopramide  Itching, Other (comments) and Swelling             Other reaction(s): Other (See Comments)   Reaction unknown    Reaction unknown             ?  Morphine  Other (comments) and Nausea and Vomiting     ?  Oxycodone-Acetaminophen  Itching     ?  Prochlorperazine  Itching, Other (comments) and Unknown (comments)             Other reaction(s): Unknown   Unsure of reaction            ?  Hydroxyzine Hcl  Hives             Family History         Problem  Relation  Age of Onset          ?  Alcohol abuse  Father       ?  Heart Disease  Brother            ?  Emphysema  Brother               Current Outpatient Medications          Medication  Sig  Dispense  Refill           ?  lipase-protease-amylase (Creon) 24,000-76,000 -120,000 unit capsule  Take  by mouth.         ?  lipase-protease-amylase (Creon) 24,000-76,000 -120,000 unit capsule  Take  by mouth daily as needed (with snacks and meals).         ?  temazepam (RESTORIL) 7.5 mg capsule  Take  by mouth nightly.         ?  tamsulosin (Flomax) 0.4 mg capsule  Take 2 Caps by mouth daily.  60 Cap  1     ?  lactobacillus rhamnosus gg 10 billion cell (Culturelle) 10 billion cell capsule  Take 1 Cap by mouth Before breakfast and dinner.  40 Cap  0           ?  cephalexin 500 mg tab  Take 500 mg by mouth three (3)  times daily.  42 Tab  0           ?  peg 400-propylene glycol (Systane, propylene glycol,) 0.4-0.3 % drop  Administer 2 Drops to both eyes daily. Patient places two drops in both eyes every AM.   Indications: dry eye         ?  metoprolol tartrate (LOPRESSOR) 25 mg tablet  Take 25 mg by mouth daily.         ?  gabapentin (NEURONTIN) 600 mg tablet  Take 600 mg by mouth two (2) times a day.         ?  amLODIPine (NORVASC) 5 mg tablet  Take 5 mg by mouth daily.         ?  allopurinol (ZYLOPRIM) 300 mg tablet  Take  by mouth daily.         ?  aspirin 81 mg chewable tablet  Take 81 mg by mouth daily.               ?  TESTOSTERONE IM  by IntraMUSCular route.               Review of Systems    Negative for: Ophthalmologic issues, ENT issues, Cardiovascular issues, respiratory issues, GI issues, neurologic issues, hematoogic issues, skin lesions, musculoskeletal issues, psychiatric issues              PHYSICAL EXAMINATION:    Visit Vitals      Ht  5\' 8"  (1.727 m)     Wt  61.7 kg (136 lb)        BMI  20.68 kg/m??        Constitutional: Well developed, well nourished male.  No acute distress.     HEENT: Normocephalic, Atraumatic, EOM's intact    CV:  no edema   Respiratory: No respiratory distress or difficulties breathing    Abdomen:  soft and non tender    GU Male:  No CVA tenderness   DRE:  Deferred   SCROTUM:  No scrotal rash or lesions noticed.  Normal bilateral testes and epididymis.    PENIS: Urethral meatus normal in location and size. No urethral discharge.  Circumsized    Skin: No evidence of jaundice.  Normal color   Neuro/Psych:  Alert and oriented. Affect appropriate.    Lymphatic:   No enlarged inguinal lymph nodes.           REVIEW OF LABS AND IMAGING:           Labs:  Results:        Chemistry   No results for input(s): GLU, NA, K, CL, CO2, BUN, CREA, CA, AGAP, BUCR, TBIL, AP, TP, ALB, GLOB,  AGRAT in the last 72 hours.      No lab exists for component: GPT        CBC w/Diff  No results for input(s): WBC, RBC,  HGB, HCT, PLT, GRANS, LYMPH, EOS, HGBEXT, HCTEXT, PLTEXT in the last 72 hours.        Cultures  No results for input(s): CULT in the last 72 hours.   All Micro Results         None                             Urinalysis  Color      Date  Value  Ref Range  Status      07/15/2018  Amber     Final            Appearance      Date  Value  Ref Range  Status      07/15/2018  Turbid     Final            Specific gravity      Date  Value  Ref Range  Status      07/15/2018  1.020  1.005 - 1.030    Final            pH (UA)      Date  Value  Ref Range  Status      07/15/2018  5.5  5 - 9    Final            Protein      Date  Value  Ref Range  Status      07/15/2018  100 (A)  NEGATIVE,Negative mg/dl  Final            Ketone  Date  Value  Ref Range  Status      07/15/2018  Negative  NEGATIVE,Negative mg/dl  Final            Bilirubin      Date  Value  Ref Range  Status      07/15/2018  Negative  NEGATIVE,Negative    Final            Blood      Date  Value  Ref Range  Status      07/15/2018  Large (A)  NEGATIVE,Negative    Final          Comment:          Operator: [id number]            Urobilinogen      Date  Value  Ref Range  Status      07/15/2018  0.2  0.0 - 1.0 EU/dl  Final            Nitrites      Date  Value  Ref Range  Status      07/15/2018  Negative  NEGATIVE,Negative    Final            Leukocyte Esterase      Date  Value  Ref Range  Status      07/15/2018  Moderate (A)  NEGATIVE,Negative    Final            Potassium      Date  Value  Ref Range  Status      07/17/2018  3.8  3.5 - 5.1 mEq/L  Final            Creatinine      Date  Value  Ref Range  Status      07/17/2018  1.2  0.6 - 1.3 mg/dl  Final            BUN      Date  Value  Ref Range  Status      07/17/2018  17  7 - 25 mg/dl  Final            Prostate Specific Ag      Date  Value  Ref Range  Status      04/30/2018  0.6  0.0 - 4.0 ng/mL  Final                 PSA  No results for input(s): PSA in the last 72 hours.        Coagulation  No results found for: PTP, INR,  APTT, INREXT

## 2018-07-31 ENCOUNTER — Encounter

## 2018-07-31 ENCOUNTER — Inpatient Hospital Stay: Admit: 2018-07-31 | Payer: MEDICARE | Attending: Urology | Primary: Family Medicine

## 2018-07-31 ENCOUNTER — Inpatient Hospital Stay: Payer: MEDICARE

## 2018-07-31 DIAGNOSIS — N200 Calculus of kidney: Secondary | ICD-10-CM

## 2018-07-31 DIAGNOSIS — D638 Anemia in other chronic diseases classified elsewhere: Secondary | ICD-10-CM | POA: Diagnosis not present

## 2018-07-31 DIAGNOSIS — B9620 Unspecified Escherichia coli [E. coli] as the cause of diseases classified elsewhere: Secondary | ICD-10-CM | POA: Diagnosis not present

## 2018-07-31 DIAGNOSIS — N189 Chronic kidney disease, unspecified: Secondary | ICD-10-CM | POA: Diagnosis not present

## 2018-07-31 DIAGNOSIS — N390 Urinary tract infection, site not specified: Secondary | ICD-10-CM | POA: Diagnosis not present

## 2018-07-31 DIAGNOSIS — N201 Calculus of ureter: Secondary | ICD-10-CM | POA: Diagnosis not present

## 2018-07-31 DIAGNOSIS — Z960 Presence of urogenital implants: Secondary | ICD-10-CM | POA: Diagnosis not present

## 2018-07-31 DIAGNOSIS — N179 Acute kidney failure, unspecified: Secondary | ICD-10-CM | POA: Diagnosis not present

## 2018-07-31 DIAGNOSIS — Z8619 Personal history of other infectious and parasitic diseases: Secondary | ICD-10-CM | POA: Diagnosis not present

## 2018-07-31 DIAGNOSIS — Z951 Presence of aortocoronary bypass graft: Secondary | ICD-10-CM | POA: Diagnosis not present

## 2018-07-31 DIAGNOSIS — Z8507 Personal history of malignant neoplasm of pancreas: Secondary | ICD-10-CM | POA: Diagnosis not present

## 2018-07-31 DIAGNOSIS — G629 Polyneuropathy, unspecified: Secondary | ICD-10-CM | POA: Diagnosis not present

## 2018-07-31 DIAGNOSIS — M109 Gout, unspecified: Secondary | ICD-10-CM | POA: Diagnosis not present

## 2018-07-31 DIAGNOSIS — Z923 Personal history of irradiation: Secondary | ICD-10-CM | POA: Diagnosis not present

## 2018-07-31 DIAGNOSIS — N529 Male erectile dysfunction, unspecified: Secondary | ICD-10-CM | POA: Diagnosis not present

## 2018-07-31 DIAGNOSIS — Z87442 Personal history of urinary calculi: Secondary | ICD-10-CM | POA: Diagnosis not present

## 2018-07-31 DIAGNOSIS — Z9221 Personal history of antineoplastic chemotherapy: Secondary | ICD-10-CM | POA: Diagnosis not present

## 2018-07-31 DIAGNOSIS — I2510 Atherosclerotic heart disease of native coronary artery without angina pectoris: Secondary | ICD-10-CM | POA: Diagnosis not present

## 2018-07-31 DIAGNOSIS — M1990 Unspecified osteoarthritis, unspecified site: Secondary | ICD-10-CM | POA: Diagnosis not present

## 2018-07-31 DIAGNOSIS — I129 Hypertensive chronic kidney disease with stage 1 through stage 4 chronic kidney disease, or unspecified chronic kidney disease: Secondary | ICD-10-CM | POA: Diagnosis not present

## 2018-07-31 DIAGNOSIS — Z87440 Personal history of urinary (tract) infections: Secondary | ICD-10-CM | POA: Diagnosis not present

## 2018-07-31 MED ORDER — PROPOFOL 10 MG/ML IV EMUL
10 mg/mL | INTRAVENOUS | Status: DC | PRN
Start: 2018-07-31 — End: 2018-07-31
  Administered 2018-07-31: 14:00:00 via INTRAVENOUS

## 2018-07-31 MED ORDER — FENTANYL CITRATE (PF) 50 MCG/ML IJ SOLN
50 mcg/mL | INTRAMUSCULAR | Status: DC | PRN
Start: 2018-07-31 — End: 2018-07-31

## 2018-07-31 MED ORDER — SCOPOLAMINE (1.3-1.5) MG 72 HR TRANSDERM PATCH
1 mg over 3 days | TRANSDERMAL | Status: DC
Start: 2018-07-31 — End: 2018-07-31

## 2018-07-31 MED ORDER — CEFAZOLIN 2 GM/50 ML IN DEXTROSE (ISO-OSMOTIC) IVPB
2 gram/50 mL | INTRAVENOUS | Status: DC
Start: 2018-07-31 — End: 2018-07-31

## 2018-07-31 MED ORDER — ONDANSETRON (PF) 4 MG/2 ML INJECTION
4 mg/2 mL | INTRAMUSCULAR | Status: DC | PRN
Start: 2018-07-31 — End: 2018-07-31
  Administered 2018-07-31: 14:00:00 via INTRAVENOUS

## 2018-07-31 MED ORDER — DEXAMETHASONE SODIUM PHOSPHATE 4 MG/ML IJ SOLN
4 mg/mL | Freq: Once | INTRAMUSCULAR | Status: DC | PRN
Start: 2018-07-31 — End: 2018-07-31

## 2018-07-31 MED ORDER — KETOROLAC TROMETHAMINE 30 MG/ML INJECTION
30 mg/mL (1 mL) | Freq: Once | INTRAMUSCULAR | Status: DC | PRN
Start: 2018-07-31 — End: 2018-07-31

## 2018-07-31 MED ORDER — ONDANSETRON (PF) 4 MG/2 ML INJECTION
4 mg/2 mL | Freq: Once | INTRAMUSCULAR | Status: DC
Start: 2018-07-31 — End: 2018-07-31

## 2018-07-31 MED ORDER — EPHEDRINE SULFATE 50 MG/ML INJECTION SOLUTION
50 mg/mL | INTRAMUSCULAR | Status: DC | PRN
Start: 2018-07-31 — End: 2018-07-31
  Administered 2018-07-31 (×4): via INTRAVENOUS

## 2018-07-31 MED ORDER — ONDANSETRON (PF) 4 MG/2 ML INJECTION
4 mg/2 mL | Freq: Once | INTRAMUSCULAR | Status: DC | PRN
Start: 2018-07-31 — End: 2018-07-31

## 2018-07-31 MED ORDER — PROMETHAZINE 25 MG TAB
25 mg | Freq: Once | ORAL | Status: DC | PRN
Start: 2018-07-31 — End: 2018-07-31

## 2018-07-31 MED ORDER — LACTATED RINGERS IV
INTRAVENOUS | Status: DC
Start: 2018-07-31 — End: 2018-07-31
  Administered 2018-07-31: 14:00:00 via INTRAVENOUS

## 2018-07-31 MED ORDER — GLYCOPYRROLATE 0.2 MG/ML IJ SOLN
0.2 mg/mL | INTRAMUSCULAR | Status: DC
Start: 2018-07-31 — End: 2018-07-31

## 2018-07-31 MED ORDER — LABETALOL 5 MG/ML IV SYRINGE
20 mg/4 mL (5 mg/mL) | INTRAVENOUS | Status: DC | PRN
Start: 2018-07-31 — End: 2018-07-31

## 2018-07-31 MED ORDER — FLUMAZENIL 0.1 MG/ML IV SOLN
0.1 mg/mL | INTRAVENOUS | Status: DC | PRN
Start: 2018-07-31 — End: 2018-07-31

## 2018-07-31 MED ORDER — CEFAZOLIN 1 GRAM SOLUTION FOR INJECTION
1 gram | INTRAMUSCULAR | Status: DC | PRN
Start: 2018-07-31 — End: 2018-07-31
  Administered 2018-07-31: 14:00:00 via INTRAVENOUS

## 2018-07-31 MED ORDER — FENTANYL CITRATE (PF) 50 MCG/ML IJ SOLN
50 mcg/mL | INTRAMUSCULAR | Status: DC | PRN
Start: 2018-07-31 — End: 2018-07-31
  Administered 2018-07-31 (×2): via INTRAVENOUS

## 2018-07-31 MED ORDER — CEPHALEXIN 500 MG CAP
500 mg | ORAL_CAPSULE | Freq: Two times a day (BID) | ORAL | 0 refills | Status: AC
Start: 2018-07-31 — End: 2018-08-03

## 2018-07-31 MED ORDER — LACTATED RINGERS IV
INTRAVENOUS | Status: DC | PRN
Start: 2018-07-31 — End: 2018-07-31
  Administered 2018-07-31: 14:00:00 via INTRAVENOUS

## 2018-07-31 MED ORDER — TRAMADOL 50 MG TAB
50 mg | ORAL_TABLET | Freq: Four times a day (QID) | ORAL | 0 refills | Status: AC | PRN
Start: 2018-07-31 — End: 2018-08-07

## 2018-07-31 MED ORDER — IOVERSOL 350 MG/ML IV SOLN
350 mg iodine/mL | INTRAVENOUS | Status: DC | PRN
Start: 2018-07-31 — End: 2018-07-31
  Administered 2018-07-31: 14:00:00

## 2018-07-31 MED ORDER — FENTANYL CITRATE (PF) 50 MCG/ML IJ SOLN
50 mcg/mL | INTRAMUSCULAR | Status: AC
Start: 2018-07-31 — End: 2018-07-31

## 2018-07-31 MED ORDER — NALOXONE 0.4 MG/ML INJECTION
0.4 mg/mL | INTRAMUSCULAR | Status: DC | PRN
Start: 2018-07-31 — End: 2018-07-31

## 2018-07-31 MED ORDER — PROMETHAZINE 25 MG/ML INJECTION
25 mg/mL | INTRAMUSCULAR | Status: DC | PRN
Start: 2018-07-31 — End: 2018-07-31
  Administered 2018-07-31: 17:00:00 via INTRAMUSCULAR

## 2018-07-31 MED ORDER — HALOPERIDOL LACTATE 5 MG/ML IJ SOLN
5 mg/mL | INTRAMUSCULAR | Status: AC | PRN
Start: 2018-07-31 — End: 2018-07-31
  Administered 2018-07-31 (×2): via INTRAVENOUS

## 2018-07-31 MED FILL — TRANSDERM-SCOP 1 MG OVER 3 DAYS TRANSDERMAL PATCH: 1 mg over 3 days | TRANSDERMAL | Qty: 1

## 2018-07-31 MED FILL — LABETALOL 5 MG/ML IV SYRINGE: 20 mg/4 mL (5 mg/mL) | INTRAVENOUS | Qty: 4

## 2018-07-31 MED FILL — LACTATED RINGERS IV: INTRAVENOUS | Qty: 1000

## 2018-07-31 MED FILL — HALOPERIDOL LACTATE 5 MG/ML IJ SOLN: 5 mg/mL | INTRAMUSCULAR | Qty: 1

## 2018-07-31 MED FILL — FENTANYL CITRATE (PF) 50 MCG/ML IJ SOLN: 50 mcg/mL | INTRAMUSCULAR | Qty: 2

## 2018-07-31 MED FILL — CEFAZOLIN 2 GM/50 ML IN DEXTROSE (ISO-OSMOTIC) IVPB: 2 gram/50 mL | INTRAVENOUS | Qty: 50

## 2018-07-31 MED FILL — PROMETHAZINE 25 MG/ML INJECTION: 25 mg/mL | INTRAMUSCULAR | Qty: 1

## 2018-07-31 MED FILL — GLYCOPYRROLATE 0.2 MG/ML IJ SOLN: 0.2 mg/mL | INTRAMUSCULAR | Qty: 1

## 2018-07-31 NOTE — Anesthesia Post-Procedure Evaluation (Signed)
Procedure(s):  CYSTOSCOPY RETROGRADES,LEFT  URETEROSCOPY LASER LITHOTRIPSY,LEFT STENT PLACEMENT.    general    Anesthesia Post Evaluation      Multimodal analgesia: multimodal analgesia used between 6 hours prior to anesthesia start to PACU discharge  Patient location during evaluation: PACU  Patient participation: complete - patient participated  Level of consciousness: awake  Pain score: 1  Pain management: adequate  Airway patency: patent  Anesthetic complications: no  Cardiovascular status: acceptable  Respiratory status: acceptable  Hydration status: acceptable  Post anesthesia nausea and vomiting:  none      INITIAL Post-op Vital signs:   Vitals Value Taken Time   BP 145/73 07/31/2018 11:55 AM   Temp 36.1 ??C (97 ??F) 07/31/2018 11:35 AM   Pulse 75 07/31/2018 11:55 AM   Resp 13 07/31/2018 11:55 AM   SpO2 100 % 07/31/2018 11:55 AM

## 2018-07-31 NOTE — Other (Signed)
Patient sleepy. VSS. C/O nausea, medicated with Haldol 0.5mg  IV

## 2018-07-31 NOTE — Op Note (Signed)
[facility]  Operation Report  NAME:  Tommy Brown, Tommy Brown  SEX:   M  DATE: 07/31/2018  DOB: [birth year]  MR#    [id number]  ROOM:  PL  ACCT#  [id number]        PREOPERATIVE DIAGNOSES:  Obstructing 8 mm left proximal ureteral stone and nonobstructing left lower pole renal calculi status post left stent placement.    POSTOPERATIVE DIAGNOSES:  Obstructing 8 mm left proximal ureteral stone and nonobstructing left lower pole renal calculi status post left stent placement.    PROCEDURES PERFORMED:  Cystoscopy, left retrograde pyelogram, left ureteroscopy, holmium laser lithotripsy of proximal ureteral stone and extraction of left renal calculi and left 6-French x 24-cm double-J ureteral stent exchange.    SURGEON:  Crist Infante, MD    ANESTHESIA:  General.    SPECIMENS COLLECTED:  Left ureteral calculi for stone analysis.    BLOOD LOSS:  Minimal.    COMPLICATIONS:  None.    INDICATION FOR PROCEDURE:  This is a 73 year old gentleman who initially presented with acute kidney injury and urinary tract infection.  He has since undergone a cystoscopy with left stent placement.  His urinary tract infection has been appropriately treated and he had a negative preoperative urine culture.  He returns today for definitive stone treatment.    DESCRIPTION OF PROCEDURE:   The patient was identified in the holding area.  He received 2 grams of Ancef preoperatively.  He was brought into the cystoscopy room and placed on the table.  After induction of general anesthesia, his legs were put up into lithotomy position.  His lower abdomen and genitalia were prepped and draped in a sterile fashion.  After appropriate surgical timeout, I initially used fluoroscopy and could visualize the previously placed stent along with a large stone sitting adjacent to the stent in the proximal ureter.  There also appears to be a cluster of stones in the lower pole in the mid renal calyx of the left kidney.  The patient has an inflatable penile prosthesis.  We were able to pass the cystoscope through the urethra into the bladder.  The previously placed stent was grasped with the alligator grasper and pulled out through the urethral meatus.  I then passed a Glidewire through the lumen of the stent and advanced this all the way up into the left kidney.  The stent was removed off of the wire.  I then used a Pharmacist, community x 12-French 35 cm ureteral access sheath.  This was easily advanced alongside the guidewire and passed under fluoroscopic guidance to the proximal ureter sitting just below the stone.  I then injected some contrast through the access sheath to delineate the renal collecting system.  I removed the introducer to the access sheath and passed a flexible digital ureteroscope through the access sheath.  This was passed under direct visualization all the way up to the large proximal ureteral stone.  I used 200-micron laser fiber and fragmented the stone into multiple small pieces.  I then used a nitinol NGage basket and extracted the stone fragments from the proximal ureter.  These were collected and sent for stone analysis.  After removing the proximal ureteral stone, I then passed the scope back into the ureter and advanced this all the way up into the left kidney.  The renal  collecting systems were visually inspected from the upper pole all the way down into the lower pole.  The patient had had some small fragments sitting in  the mid calyx and a cluster of stone sitting in the lower pole calyx ranging from 2 to 3 mm all the way up to 5 mm in size.  I again used a nitinol NGage basket and systematically extract all the stone fragments from the mid calyx and also extracted the stone from the lower pole calyx.  At the end of the procedure, there were no obvious radiopaque densities overlying the left kidney.  A final inspection the renal collecting system did not reveal any residual stones.  The ureteroscope and the access sheath was then removed under direct visualization.  The guidewire was backloaded through a cystoscope.  We selected a 6-French x 24-cm double-J stent which was loaded over the wire and advanced into the left ureter.  After removal of the guidewire, the proximal portion was curled within the renal pelvis and distal portion was visualized within the urinary bladder.  All the irrigation fluids were drained via the cystoscope sheath.  A pull string attached to the stent was externalized to the urethral meatus and taped securely to the patient's penis with some benzoin and OpSite dressing.  The patient tolerated the procedure well and was extubated and taken to the recovery room in good condition.  The plan is to have him follow up in the office in 1 week for string stent removal.      ___________________  Tommy Flack MD  Dictated By:.   SB  D:07/31/2018 11:49:33  T: 07/31/2018 12:25:20  [id number]

## 2018-07-31 NOTE — Other (Addendum)
Dortha Kern, RN. Assessment unchanged. Pt awake but listless. C/o nausea. Just received haldol. [name] given an update. [place] with Dr. Donna Christen. Pt with c/o nausea. Give 0.5 mg haldol IV per Dr. Donna Christen. 1258 pt still with c/o nausea. Give 6.25 mg phenergan and place scopolamine patch per Dr. Donna Christen. [place] with [name] on the phone to update. Teaching done re: scopolamine patch. Verbalized an understanding. Pt remains with c/o nausea. Awaiting effects of phenergan and scopolamine patch. 1322 pt looking more awake and says he is "feeling better." Attempting to drink gingerale and eat crackers. 1340 ready for discharge. Feeling "like a new man."

## 2018-07-31 NOTE — Other (Signed)
All discharge instructions reviewed with patient's family, [name]. Verbalized an understanding. Provided printed copy of discharge instructions to [facility].

## 2018-07-31 NOTE — Telephone Encounter (Signed)
Lauren from [facility] called in and stated that the pt was prescribed      traMADoL (Ultram) 50 mg tablet but the pt is taking temazepam (RESTORIL) 7.5 mg capsule. Lauren stated that it could have a drug interaction and wanted to verify if pt should take Tramadol. Lauren can be reached at [phone].

## 2018-07-31 NOTE — Anesthesia Pre-Procedure Evaluation (Signed)
Relevant Problems   No relevant active problems       Anesthetic History   No history of anesthetic complications            Review of Systems / Medical History  Patient summary reviewed and pertinent labs reviewed    Pulmonary  Within defined limits                 Neuro/Psych         Neuromuscular disease    Comments: neuropathy Cardiovascular    Hypertension: well controlled          CAD and CABG    Exercise tolerance: >4 METS     GI/Hepatic/Renal         Renal disease: stones and CRI       Endo/Other        Arthritis and cancer     Other Findings   Comments: Pancreatic CA hx           Physical Exam    Airway  Mallampati: II    Neck ROM: normal range of motion   Mouth opening: Normal     Cardiovascular    Rhythm: regular  Rate: normal         Dental  No notable dental hx       Pulmonary  Breath sounds clear to auscultation               Abdominal  GI exam deferred       Other Findings            Anesthetic Plan    ASA: 3  Anesthesia type: general          Induction: Intravenous  Anesthetic plan and risks discussed with: Patient

## 2018-07-31 NOTE — Brief Op Note (Signed)
Brief Postoperative Note    Patient: Tommy Medina Sr.  Date of Birth: 02-09-1946  MRN: [id number]    Date of Procedure: 07/31/2018     Pre-Op Diagnosis: 55mm Left proximal  ureteral stone [N20.1]; left renal stones    Post-Op Diagnosis: Same as preoperative diagnosis.      Procedure(s):  CYSTOSCOPY LEFT RETROGRADES,LEFT  URETEROSCOPY LASER LITHOTRIPSY and extraction of stones , LEFT STENT REPLACEMENT    Surgeon(s):  Nils Flack, MD    Surgical Assistant: None    Anesthesia: General     Estimated Blood Loss (mL): Minimal    Complications: None    Specimens:   ID Type Source Tests Collected by Time Destination   1 : left ureteral stone Stone Ureter AP HISTOLOGY Nils Flack, MD 07/31/2018 1049         Implants:   Implant Name Type Inv. Item Serial No. Manufacturer Lot No. LRB No. Used Action   STENT URETERAL 6FR X Edmonia Lynch - [id number] Stent STENT URETERAL 6FR X 24MM INLAY NO Ricky Stabs EP [id number] Left 1 Implanted       Drains:   Nephroureteral Drain 07/31/18 Left Ureter (Active)       Findings: see dictation    Electronically Signed by Nils Flack, MD on 07/31/2018 at 11:38 AM

## 2018-07-31 NOTE — Interval H&P Note (Signed)
All discharge instructions reviewed with patient's family, [name]. Verbalized an understanding. Provided printed copy of discharge instructions to [facility].

## 2018-07-31 NOTE — Interval H&P Note (Signed)
 Ammon CANDIE Redman, RN. Assessment unchanged. Pt awake but listless. C/o nausea. Just received haldol. [place] given an update. 1237 Spoke with Dr. Olivia. Pt with c/o nausea. Give 0.5 mg haldol IV per Dr. Olivia. 1258 pt still with c/o nausea. Give 6.25 mg phenergan  and place scopolamine patch per Dr. Olivia. 1311 Spoke with Aldona on the phone to update. Teaching done re: scopolamine patch. Verbalized an understanding. Pt remains with c/o nausea. Awaiting effects of phenergan  and scopolamine patch. 1322 pt looking more awake and says he is feeling better. Attempting to drink gingerale and eat crackers. 1340 ready for discharge. Feeling like a new man.

## 2018-07-31 NOTE — Interval H&P Note (Signed)
Patient sleepy. VSS. C/O nausea, medicated with Haldol 0.5mg  IV

## 2018-07-31 NOTE — Anesthesia Pre-Procedure Evaluation (Signed)
 Formatting of this note is different from the original.  Relevant Problems   No relevant active problems     Anesthetic History   No history of anesthetic complications        Review of Systems / Medical History  Patient summary reviewed and pertinent labs reviewed    Pulmonary  Within defined limits     Neuro/Psych     Neuromuscular disease    Comments: neuropathy Cardiovascular    Hypertension: well controlled    CAD and CABG    Exercise tolerance: >4 METS    GI/Hepatic/Renal      Renal disease: stones and CRI     Endo/Other    Arthritis and cancer     Other Findings   Comments: Pancreatic CA hx     Physical Exam    Airway  Mallampati: II    Neck ROM: normal range of motion   Mouth opening: Normal     Cardiovascular    Rhythm: regular  Rate: normal     Dental  No notable dental hx      Pulmonary  Breath sounds clear to auscultation     Abdominal  GI exam deferred     Other Findings       Anesthetic Plan    ASA: 3  Anesthesia type: general    Induction: Intravenous  Anesthetic plan and risks discussed with: Patient        Electronically signed by Sharol Given, MD at 07/31/2018  9:44 AM EDT

## 2018-07-31 NOTE — Op Note (Signed)
Brief Postoperative Note    Patient: Tommy Medina Sr.  Date of Birth: [birth year]  MRN: [id number]    Date of Procedure: 07/31/2018     Pre-Op Diagnosis: 73mm Left proximal  ureteral stone [N20.1]; left renal stones    Post-Op Diagnosis: Same as preoperative diagnosis.      Procedure(s):  CYSTOSCOPY LEFT RETROGRADES,LEFT  URETEROSCOPY LASER LITHOTRIPSY and extraction of stones , LEFT STENT REPLACEMENT    Surgeon(s):  Nils Flack, MD    Surgical Assistant: None    Anesthesia: General     Estimated Blood Loss (mL): Minimal    Complications: None    Specimens:   ID Type Source Tests Collected by Time Destination   1 : left ureteral stone Stone Ureter AP HISTOLOGY Nils Flack, MD 07/31/2018 1049         Implants:   Implant Name Type Inv. Item Serial No. Manufacturer Lot No. LRB No. Used Action   STENT URETERAL 6FR X Edmonia Lynch - [id number] Stent STENT URETERAL 6FR X 24MM INLAY NO Ricky Stabs EP [id number] Left 1 Implanted       Drains:   Nephroureteral Drain 07/31/18 Left Ureter (Active)       Findings: see dictation    Electronically Signed by Nils Flack, MD on 07/31/2018 at 11:38 AM

## 2018-07-31 NOTE — Op Note (Signed)
[facility]  Operation Report  NAME:  Tommy Brown, Tommy Brown  SEX:   M  DATE: 07/31/2018  DOB: [birth year]  MR#    [id number]  ROOM:  PL  ACCT#  [id number]        PREOPERATIVE DIAGNOSES:  Obstructing 8 mm left proximal ureteral stone and nonobstructing left lower pole renal calculi status post left stent placement.    POSTOPERATIVE DIAGNOSES:  Obstructing 8 mm left proximal ureteral stone and nonobstructing left lower pole renal calculi status post left stent placement.    PROCEDURES PERFORMED:  Cystoscopy, left retrograde pyelogram, left ureteroscopy, holmium laser lithotripsy of proximal ureteral stone and extraction of left renal calculi and left 6-French x 24-cm double-J ureteral stent exchange.    SURGEON:  Crist Infante, MD    ANESTHESIA:  General.    SPECIMENS COLLECTED:  Left ureteral calculi for stone analysis.    BLOOD LOSS:  Minimal.    COMPLICATIONS:  None.    INDICATION FOR PROCEDURE:  This is a 73 year old gentleman who initially presented with acute kidney injury and urinary tract infection.  He has since undergone a cystoscopy with left stent placement.  His urinary tract infection has been appropriately treated and he had a negative preoperative urine culture.  He returns today for definitive stone treatment.    DESCRIPTION OF PROCEDURE:  The patient was identified in the holding area.  He received 2 grams of Ancef preoperatively.  He was brought into the cystoscopy room and placed on the table.  After induction of general anesthesia, his legs were put up into lithotomy position.  His lower abdomen and genitalia were prepped and draped in a sterile fashion.  After appropriate surgical timeout, I initially used fluoroscopy and could visualize the previously placed stent along with a large stone sitting adjacent to the stent in the proximal ureter.  There also appears to be a cluster of stones in the lower pole in the mid renal calyx of the left kidney.  The patient has an inflatable penile  prosthesis.  We were able to pass the cystoscope through the urethra into the bladder.  The previously placed stent was grasped with the alligator grasper and pulled out through the urethral meatus.  I then passed a Glidewire through the lumen of the stent and advanced this all the way up into the left kidney.  The stent was removed off of the wire.  I then used a Pharmacist, community x 12-French 35 cm ureteral access sheath.  This was easily advanced alongside the guidewire and passed under fluoroscopic guidance to the proximal ureter sitting just below the stone.  I then injected some contrast through the access sheath to delineate the renal collecting system.  I removed the introducer to the access sheath and passed a flexible digital ureteroscope through the access sheath.  This was passed under direct visualization all the way up to the large proximal ureteral stone.  I used 200-micron laser fiber and fragmented the stone into multiple small pieces.  I then used a nitinol NGage basket and extracted the stone fragments from the proximal ureter.  These were collected and sent for stone analysis.  After removing the proximal ureteral stone, I then passed the scope back into the ureter and advanced this all the way up into the left kidney.  The renal collecting systems were visually inspected from the upper pole all the way down into the lower pole.  The patient had had some small fragments sitting in the  mid calyx and a cluster of stone sitting in the lower pole calyx ranging from 2 to 3 mm all the way up to 5 mm in size.  I again used a nitinol NGage basket and systematically extract all the stone fragments from the mid calyx and also extracted the stone from the lower pole calyx.  At the end of the procedure, there were no obvious radiopaque densities overlying the left kidney.  A final inspection the renal collecting system did not reveal any residual stones.  The ureteroscope and the access sheath  was then removed under direct visualization.  The guidewire was backloaded through a cystoscope.  We selected a 6-French x 24-cm double-J stent which was loaded over the wire and advanced into the left ureter.  After removal of the guidewire, the proximal portion was curled within the renal pelvis and distal portion was visualized within the urinary bladder.  All the irrigation fluids were drained via the cystoscope sheath.  A pull string attached to the stent was externalized to the urethral meatus and taped securely to the patient's penis with some benzoin and OpSite dressing.  The patient tolerated the procedure well and was extubated and taken to the recovery room in good condition.  The plan is to have him follow up in the office in 1 week for string stent removal.      ___________________  Nils Flack MD  Dictated By:.   SB  D:07/31/2018 11:49:33  T: 07/31/2018 12:25:20  [id number]

## 2018-07-31 NOTE — Anesthesia Post-Procedure Evaluation (Signed)
 Formatting of this note is different from the original.    Procedure(s):  CYSTOSCOPY RETROGRADES,LEFT  URETEROSCOPY LASER LITHOTRIPSY,LEFT STENT PLACEMENT.    general    Anesthesia Post Evaluation    Multimodal analgesia: multimodal analgesia used between 6 hours prior to anesthesia start to PACU discharge  Patient location during evaluation: PACU  Patient participation: complete - patient participated  Level of consciousness: awake  Pain score: 1  Pain management: adequate  Airway patency: patent  Anesthetic complications: no  Cardiovascular status: acceptable  Respiratory status: acceptable  Hydration status: acceptable  Post anesthesia nausea and vomiting:  none    INITIAL Post-op Vital signs:   Vitals Value Taken Time   BP 145/73 07/31/2018 11:55 AM   Temp 36.1 C (97 F) 07/31/2018 11:35 AM   Pulse 75 07/31/2018 11:55 AM   Resp 13 07/31/2018 11:55 AM   SpO2 100 % 07/31/2018 11:55 AM     Electronically signed by Sharol Given, MD at 07/31/2018  2:41 PM EDT

## 2018-08-03 ENCOUNTER — Encounter: Payer: MEDICARE | Attending: Urology | Primary: Family Medicine

## 2018-08-04 NOTE — Telephone Encounter (Signed)
I spoke with Tommy Brown in the Pharm and advised that per Dr. Oleta Mouse,  Don't take both meds at same time.  Pt can still have rx for the tramadol for pain.    [name]  ([place])

## 2018-08-05 DIAGNOSIS — E291 Testicular hypofunction: Secondary | ICD-10-CM | POA: Diagnosis not present

## 2018-08-06 ENCOUNTER — Ambulatory Visit: Attending: Urology | Primary: Family Medicine

## 2018-08-06 ENCOUNTER — Ambulatory Visit: Admit: 2018-08-06 | Discharge: 2018-08-06 | Payer: MEDICARE | Attending: Urology | Primary: Family Medicine

## 2018-08-06 DIAGNOSIS — N201 Calculus of ureter: Secondary | ICD-10-CM

## 2018-08-06 DIAGNOSIS — N200 Calculus of kidney: Secondary | ICD-10-CM | POA: Diagnosis not present

## 2018-08-06 LAB — AMB POC URINALYSIS DIP STICK AUTO W/O MICRO
Bilirubin (UA POC): NEGATIVE
Bilirubin, Urine, POC: NEGATIVE
Glucose (UA POC): NEGATIVE
Glucose, Urine, POC: NEGATIVE
Nitrite, Urine, POC: NEGATIVE
Nitrites (UA POC): NEGATIVE
Specific Gravity, Urine, POC: 1.02 NA (ref 1.001–1.035)
Specific gravity (UA POC): 1.02 (ref 1.001–1.035)
Urobilinogen (UA POC): 0.2 (ref 0.2–1)
Urobilinogen, POC: 0.2 (ref 0.2–1)
pH (UA POC): 5.5 (ref 4.6–8.0)
pH, Urine, POC: 5.5 NA (ref 4.6–8.0)

## 2018-08-06 MED ORDER — KETOROLAC TROMETHAMINE 10 MG TAB
10 mg | ORAL_TABLET | Freq: Three times a day (TID) | ORAL | 0 refills | Status: DC | PRN
Start: 2018-08-06 — End: 2019-01-20

## 2018-08-06 NOTE — Progress Notes (Signed)
KUB performed today per Dr. Donovan Kail.     result in note  Retia Passe, MD

## 2018-08-06 NOTE — Progress Notes (Signed)
Tommy Brown  1945/10/17  Encounter date: 08/06/2018        Follow-up Visit      Encounter Diagnoses     ICD-10-CM ICD-9-CM   1. Left ureteral stone N20.1 592.1   2. Kidney stone on left side N20.0 592.0   3. Retained ureteral stent Z96.0 V43.89          ASSESSMENT & PLAN:   1. Kidney stone - Left 23mm proximal ureteral stone with hydroureteronephrosis and 85mm LLP stone  ????????????????????????S/p cystoscopy, left JJ stent on 06/22/2018 by Dr. Claiborne Billings    S/p Cystoscopy, left retrograde pyelogram, left ureteroscopy, holmium laser lithotripsy of proximal ureteral stone and extraction of left renal calculi and left double-J ureteral stent exchange on 07/31/18.   KUB today 08/06/18: Left ureteral stent in good position. No ureteral or renal stone seen.     String stent removed today    Rx provided for Toradol 10 mg, take PRN for pain    2.  Hx of nephrolithiasis s/p URS with LL in 02/2017    3. Hx of ED with IPP    4. Hx of Pancreatic cancer s/p Whipple in 2006, chemotherapy and radiation??  ??  5. Hypogonadism, Managed by Dr.  Daralene Milch, MD    Testosterone 04/30/18    On TC injection      Follow-up and Dispositions    ?? Return in about 1 year (around 08/06/2019) for KUB on arrival with Dr. Oleta Mouse .         Chief Complaint   Patient presents with   ??? Kidney Stone     string stent         HPI: Tommy Brown is a 73 y.o. WHITE OR CAUCASIAN male  who presents today in follow up for an established diagnosis of stent removal. Pt is 1 week s/p Cystoscopy, left retrograde pyelogram, left ureteroscopy, holmium laser lithotripsy of proximal ureteral stone and extraction of left renal calculi and left double-J ureteral stent exchange on 07/31/18.    Pt is doing well today.   No pain.     LABS / IMAGING:      AUA Assessment Score:   ;    AUA Bother Rating:      Current Outpatient Medications   Medication Sig Dispense Refill   ??? ketorolac (TORADOL) 10 mg tablet Take 1 Tab by mouth three (3) times daily as needed for Pain. 10 Tab 0    ??? metoprolol succinate (TOPROL-XL) 25 mg XL tablet TAKE 1 TABLET BY MOUTH EVERY DAY     ??? traMADoL (Ultram) 50 mg tablet Take 1 Tab by mouth every six (6) hours as needed for Pain for up to 7 days. Max Daily Amount: 200 mg. Indications: pain 12 Tab 0   ??? lipase-protease-amylase (Creon) 24,000-76,000 -120,000 unit capsule Take  by mouth.     ??? lipase-protease-amylase (Creon) 24,000-76,000 -120,000 unit capsule Take  by mouth daily as needed (with snacks and meals).     ??? temazepam (RESTORIL) 7.5 mg capsule Take  by mouth nightly.     ??? tamsulosin (Flomax) 0.4 mg capsule Take 2 Caps by mouth daily. 60 Cap 1   ??? lactobacillus rhamnosus gg 10 billion cell (Culturelle) 10 billion cell capsule Take 1 Cap by mouth Before breakfast and dinner. 40 Cap 0   ??? cephalexin 500 mg tab Take 500 mg by mouth three (3) times daily. 42 Tab 0   ??? peg 400-propylene glycol (Systane, propylene glycol,)  0.4-0.3 % drop Administer 2 Drops to both eyes daily. Patient places two drops in both eyes every AM.   Indications: dry eye     ??? metoprolol tartrate (LOPRESSOR) 25 mg tablet Take 25 mg by mouth daily.     ??? gabapentin (NEURONTIN) 600 mg tablet Take 600 mg by mouth two (2) times a day.     ??? amLODIPine (NORVASC) 5 mg tablet Take 5 mg by mouth daily.     ??? allopurinol (ZYLOPRIM) 300 mg tablet Take  by mouth daily.     ??? aspirin 81 mg chewable tablet Take 81 mg by mouth daily.     ??? TESTOSTERONE IM by IntraMUSCular route.         All:  Allergies   Allergen Reactions   ??? Other Food Other (comments)     VISTAID  ANESTHESIA   ??? Codeine Itching and Swelling   ??? Oxycodone Anaphylaxis   ??? Hydroxyzine Pamoate Itching and Other (comments)     Other reaction(s): Unknown     ??? Meperidine Itching, Palpitations, Swelling and Other (comments)     Other reaction(s): Unknown     ??? Metoclopramide Itching, Other (comments) and Swelling     Other reaction(s): Other (See Comments)  Reaction unknown   Reaction unknown       ??? Morphine Other (comments) and Nausea and Vomiting   ??? Oxycodone-Acetaminophen Itching   ??? Prochlorperazine Itching, Other (comments) and Unknown (comments)     Other reaction(s): Unknown  Unsure of reaction     ??? Hydroxyzine Hcl Hives       Past Medical History:   Diagnosis Date   ??? Arthritis    ??? Benign prostate hyperplasia    ??? Bladder infection    ??? Chronic kidney disease    ??? Coronary arteriosclerosis    ??? Coronary atherosclerosis of artery bypass graft    ??? Decreased testosterone level    ??? Diverticular disease    ??? Essential hypertension    ??? Gout    ??? History of acute renal failure    ??? History of anemia    ??? History of malignant neoplasm of pancreas    ??? Hx of CABG    ??? Kidney stone    ??? Kidney stones    ??? Neuropathy    ??? Pancreatic cancer ([place])    ??? Sepsis ([place])    ??? Tremor        Past Surgical History:   Procedure Laterality Date   ??? HX CERVICAL FUSION     ??? HX COLONOSCOPY     ??? HX CORONARY ARTERY BYPASS GRAFT  1995   ??? HX HEART CATHETERIZATION  2006   ??? HX ORTHOPAEDIC      back   ??? HX OTHER SURGICAL  2006    whipple procedure done for pancreatic cancer   ??? HX UROLOGICAL  2012    Percutaneous extraction of a kidney stone w/ fragmentation procedure        Review of Systems  Constitutional: Fever: No  Skin: Rash: No  HEENT: Hearing difficulty: No  Eyes: Blurred vision: No  Cardiovascular: Chest pain: No  Respiratory: Shortness of breath: No  Gastrointestinal: Nausea/vomiting: No  Musculoskeletal: Back pain: No  Neurological: Weakness: No  Psychological: Memory loss: No  Comments/additional findings:     Any elements of the PMFSHx, ROS, or preliminary elements of the HPI that were entered by a medical assistant have been reviewed in full.  PHYSICAL EXAM:    Visit Vitals  Resp 14   Ht 5\' 8"  (1.727 m)   Wt 140 lb (63.5 kg)   BMI 21.29 kg/m??     Constitutional: WDWN, Pleasant and appropriate affect, No acute distress.    CV:  No peripheral swelling noted  Respiratory: No respiratory distress or difficulties   Skin: No jaundice.    Neuro/Psych:  Alert and oriented x 3. Affect appropriate.       Body mass index is 21.29 kg/m??. Patient's BMI is out of the normal parameters.  Information about BMI was given and patient was advised to follow-up with their PCP for further management.     Labs Today:  Results for orders placed or performed in visit on 08/06/18   AMB POC XRAY ABDOMEN 1 VIEW    Impression    [facility] in office today, 08/06/2018    Previous KUB: YES    Reason for Examination:     Patient presents with:  Kidney Stone: string stent       Renal silhouette is well visualized.       Renal stones seen?  NO       Ureteral stone?   NO      Bowel gas pattern normal?  YES  Acute osseus abnormality?  NO  Phleboliths seen in pelvis?  NO    Impression:   Left ureteral stent in good position. No ureteral or renal stone seen.       Read by:     Retia Passe, M.D., F.A.C.S. on 08/06/2018     Medical Documentation is provided with the assistance of Fortino Sic, medical scribe for Retia Passe, M.D., F.A.C.S.     AMB POC URINALYSIS DIP STICK AUTO W/O MICRO   Result Value Ref Range    Color (UA POC) Yellow     Clarity (UA POC) Clear     Glucose (UA POC) Negative Negative    Bilirubin (UA POC) Negative Negative    Ketones (UA POC) Trace Negative    Specific gravity (UA POC) 1.020 1.001 - 1.035    Blood (UA POC) 2+ Negative    pH (UA POC) 5.5 4.6 - 8.0    Protein (UA POC) Trace Negative    Urobilinogen (UA POC) 0.2 mg/dL 0.2 - 1    Nitrites (UA POC) Negative Negative    Leukocyte esterase (UA POC) Trace Negative        Retia Passe, M.D., F.A.C.S.    Documentation provided by Fortino Sic, medical scribe for Retia Passe, M.D., F.A.C.S.

## 2018-08-06 NOTE — Progress Notes (Signed)
Progress Notes by Retia Passe, MD at 08/06/18 1100                Author: Retia Passe, MD  Service: --  Author Type: Physician       Filed: 08/06/18 1407  Encounter Date: 08/06/2018  Status: Signed          Editor: Retia Passe, MD (Physician)               Tommy Brown   [birth year]   Encounter date: 08/06/2018              Follow-up Visit           Encounter Diagnoses              ICD-10-CM  ICD-9-CM          1.  Left ureteral stone  N20.1  592.1     2.  Kidney stone on left side  N20.0  592.0          3.  Retained ureteral stent  Z96.0  V43.89               ASSESSMENT & PLAN:    1. Kidney stone - Left 45mm proximal ureteral stone with hydroureteronephrosis and 25mm LLP stone   ????????????????????????S/p cystoscopy, left JJ stent on 06/22/2018 by Dr. Claiborne Billings     S/p Cystoscopy, left retrograde pyelogram, left ureteroscopy, holmium laser lithotripsy of proximal ureteral stone and extraction of left renal calculi and left double-J ureteral stent exchange on 07/31/18.    KUB today 08/06/18: Left ureteral stent in good position. No ureteral or renal stone seen.      String stent removed today     Rx provided for Toradol 10 mg, take PRN for pain      2.  Hx of nephrolithiasis s/p URS with LL in 02/2017      3. Hx of ED with IPP      4. Hx of Pancreatic cancer s/p Whipple in 2006, chemotherapy and radiation??   ??   5. Hypogonadism, Managed by Dr.  Daralene Milch, MD     Testosterone 04/30/18     On TC injection           Follow-up and Dispositions      ??  Return in about 1 year (around 08/06/2019) for KUB on arrival with Dr. Oleta Mouse .                  Chief Complaint       Patient presents with        ?  Kidney Stone             string stent             HPI: Tommy Brown is a  73 y.o. WHITE OR CAUCASIAN male   who presents today in follow up for an established diagnosis of stent removal. Pt is 1 week s/p Cystoscopy, left retrograde pyelogram, left ureteroscopy, holmium laser lithotripsy of proximal  ureteral stone and extraction of left renal calculi and left  double-J ureteral stent exchange on 07/31/18.      Pt is doing well today.    No pain.       LABS / IMAGING:         AUA Assessment Score:    ;     AUA Bother Rating:          Current Outpatient Medications  Medication  Sig  Dispense  Refill           ?  ketorolac (TORADOL) 10 mg tablet  Take 1 Tab by mouth three (3) times daily as needed for Pain.  10 Tab  0     ?  metoprolol succinate (TOPROL-XL) 25 mg XL tablet  TAKE 1 TABLET BY MOUTH EVERY DAY         ?  traMADoL (Ultram) 50 mg tablet  Take 1 Tab by mouth every six (6) hours as needed for Pain for up to 7 days. Max Daily Amount: 200 mg. Indications: pain  12 Tab  0     ?  lipase-protease-amylase (Creon) 24,000-76,000 -120,000 unit capsule  Take  by mouth.         ?  lipase-protease-amylase (Creon) 24,000-76,000 -120,000 unit capsule  Take  by mouth daily as needed (with snacks and meals).         ?  temazepam (RESTORIL) 7.5 mg capsule  Take  by mouth nightly.         ?  tamsulosin (Flomax) 0.4 mg capsule  Take 2 Caps by mouth daily.  60 Cap  1     ?  lactobacillus rhamnosus gg 10 billion cell (Culturelle) 10 billion cell capsule  Take 1 Cap by mouth Before breakfast and dinner.  40 Cap  0     ?  cephalexin 500 mg tab  Take 500 mg by mouth three (3) times daily.  42 Tab  0     ?  peg 400-propylene glycol (Systane, propylene glycol,) 0.4-0.3 % drop  Administer 2 Drops to both eyes daily. Patient places two drops in both eyes every AM.   Indications: dry eye         ?  metoprolol tartrate (LOPRESSOR) 25 mg tablet  Take 25 mg by mouth daily.         ?  gabapentin (NEURONTIN) 600 mg tablet  Take 600 mg by mouth two (2) times a day.         ?  amLODIPine (NORVASC) 5 mg tablet  Take 5 mg by mouth daily.         ?  allopurinol (ZYLOPRIM) 300 mg tablet  Take  by mouth daily.         ?  aspirin 81 mg chewable tablet  Take 81 mg by mouth daily.               ?  TESTOSTERONE IM  by IntraMUSCular route.                All:     Allergies        Allergen  Reactions         ?  Other Food  Other (comments)             VISTAID   ANESTHESIA         ?  Codeine  Itching and Swelling     ?  Oxycodone  Anaphylaxis     ?  Hydroxyzine Pamoate  Itching and Other (comments)             Other reaction(s): Unknown            ?  Meperidine  Itching, Palpitations, Swelling and Other (comments)             Other reaction(s): Unknown            ?  Metoclopramide  Itching, Other (  comments) and Swelling             Other reaction(s): Other (See Comments)   Reaction unknown    Reaction unknown             ?  Morphine  Other (comments) and Nausea and Vomiting     ?  Oxycodone-Acetaminophen  Itching     ?  Prochlorperazine  Itching, Other (comments) and Unknown (comments)             Other reaction(s): Unknown   Unsure of reaction            ?  Hydroxyzine Hcl  Hives             Past Medical History:        Diagnosis  Date         ?  Arthritis       ?  Benign prostate hyperplasia       ?  Bladder infection       ?  Chronic kidney disease       ?  Coronary arteriosclerosis       ?  Coronary atherosclerosis of artery bypass graft       ?  Decreased testosterone level       ?  Diverticular disease       ?  Essential hypertension       ?  Gout       ?  History of acute renal failure       ?  History of anemia       ?  History of malignant neoplasm of pancreas       ?  Hx of CABG       ?  Kidney stone       ?  Kidney stones       ?  Neuropathy       ?  Pancreatic cancer ([place])       ?  Sepsis ([place])           ?  Tremor               Past Surgical History:         Procedure  Laterality  Date          ?  HX CERVICAL FUSION         ?  HX COLONOSCOPY         ?  HX CORONARY ARTERY BYPASS GRAFT    1995     ?  HX HEART CATHETERIZATION    2006     ?  HX ORTHOPAEDIC              back          ?  HX OTHER SURGICAL    2006          whipple procedure done for pancreatic cancer          ?  HX UROLOGICAL    2012          Percutaneous extraction of a kidney stone w/  fragmentation procedure            Review of Systems   Constitutional: Fever: No   Skin: Rash: No   HEENT: Hearing difficulty: No   Eyes: Blurred vision: No   Cardiovascular: Chest pain: No   Respiratory: Shortness of breath: No   Gastrointestinal: Nausea/vomiting: No   Musculoskeletal: Back pain: No   Neurological: Weakness: No  Psychological: Memory loss: No   Comments/additional findings:       Any elements of the PMFSHx, ROS, or preliminary elements of the HPI that were entered by a medical assistant have been reviewed in full.      PHYSICAL EXAM:     Visit Vitals      Resp  14     Ht  5\' 8"  (1.727 m)     Wt  140 lb (63.5 kg)        BMI  21.29 kg/m??        Constitutional: WDWN, Pleasant and appropriate affect, No acute distress .     CV:  No peripheral swelling noted   Respiratory: No respiratory distress or difficulties   Skin: No jaundice.     Neuro/Psych:  Alert and o riented x 3. Affect  appropriate.          Body mass index is 21.29 kg/m??. Patient's BMI is out of the normal parameters.  Information about BMI was given and patient was advised to  follow-up with their PCP for further management.       Labs Today:     Results for orders placed or performed in visit on 08/06/18     AMB POC XRAY ABDOMEN 1 VIEW          Impression          [facility] in office today, 08/06/2018      Previous KUB: YES      Reason for Examination:      Patient presents with:   Kidney Stone: string stent          Renal silhouette is well visualized.         Renal stones seen?  NO         Ureteral stone?   NO        Bowel gas pattern normal?  YES   Acute osseus abnormality?  NO   Phleboliths seen in pelvis?  NO      Impression:    Left ureteral stent in good position. No ureteral or renal stone seen.          Read by:       Retia Passe, M.D., F.A.C.S. on 08/06/2018       Medical Documentation is provided with the assistance of Fortino Sic, medical scribe for Retia Passe, M.D., F.A.C.S.          AMB POC  URINALYSIS DIP STICK AUTO W/O MICRO         Result  Value  Ref Range            Color (UA POC)  Yellow         Clarity (UA POC)  Clear         Glucose (UA POC)  Negative  Negative       Bilirubin (UA POC)  Negative  Negative       Ketones (UA POC)  Trace  Negative       Specific gravity (UA POC)  1.020  1.001 - 1.035       Blood (UA POC)  2+  Negative       pH (UA POC)  5.5  4.6 - 8.0       Protein (UA POC)  Trace  Negative       Urobilinogen (UA POC)  0.2 mg/dL  0.2 - 1       Nitrites (UA POC)  Negative  Negative            Leukocyte esterase (UA POC)  Trace  Negative            Retia Passe, M.D., F.A.C.S.      Documentation provided by Fortino Sic, medical scribe for Retia Passe, M.D., F.A.C.S.

## 2018-08-06 NOTE — Progress Notes (Signed)
KUB performed today per Dr. Lawson Fiscal.     result in note  Sandi Raveling, MD

## 2018-08-08 LAB — STONE ANALYSIS W/O IMAGE
Stone weight: 0.138 g
Stone weight: 0.138 g

## 2018-08-11 DIAGNOSIS — B999 Unspecified infectious disease: Secondary | ICD-10-CM | POA: Diagnosis not present

## 2018-08-17 ENCOUNTER — Encounter

## 2018-08-17 ENCOUNTER — Inpatient Hospital Stay: Admit: 2018-08-17 | Discharge: 2018-08-17 | Payer: MEDICARE | Primary: Family Medicine

## 2018-08-17 DIAGNOSIS — R300 Dysuria: Secondary | ICD-10-CM

## 2018-08-17 NOTE — Telephone Encounter (Signed)
Attempted to call pt twice, phone line just beeped and said the line was busy, attempted the second home number listed, it belonged to his wife who stated she was not home. Was calling to inform pt that the lab order for a urine culture has been faxed to the [facility] center.    Renaee Munda  (CJ)

## 2018-08-17 NOTE — Telephone Encounter (Signed)
Pt called and started to get some burning around 3am this morning. He is requesting for some Keflex but I adv him that we can not just give him any antibiotic without justification of why we are giving it to him. No fever, no hematuria. Adv him to come to the offc for a urine drop off but he said that if at all possible he wants to go to the [facility] the off is just too far for him. Pls send order for urine and c/s faxed to the [facility] as req. He can be reached at [phone].

## 2018-08-17 NOTE — Progress Notes (Signed)
Please treat UTI with keflex 500mg  po bid x 7 days

## 2018-08-18 ENCOUNTER — Telehealth

## 2018-08-18 DIAGNOSIS — H34831 Tributary (branch) retinal vein occlusion, right eye, with macular edema: Secondary | ICD-10-CM | POA: Diagnosis not present

## 2018-08-18 DIAGNOSIS — S0501XD Injury of conjunctiva and corneal abrasion without foreign body, right eye, subsequent encounter: Secondary | ICD-10-CM | POA: Diagnosis not present

## 2018-08-18 MED ORDER — CEPHALEXIN 500 MG CAP
500 mg | ORAL_CAPSULE | Freq: Two times a day (BID) | ORAL | 0 refills | Status: AC
Start: 2018-08-18 — End: 2018-08-25

## 2018-08-18 NOTE — Progress Notes (Signed)
Please treat UTI with keflex 500mg  po bid x 7 days

## 2018-08-18 NOTE — Telephone Encounter (Signed)
Had urine c/s done at the [facility] center which showed a uti. Pls call in the Keflex (per instructions fr Dr Oleta Mouse) to [facility] in [facility].

## 2018-08-18 NOTE — Telephone Encounter (Signed)
Informed pt per Dr. Oleta Mouse;  Keflex 500mg  bid x 7 days. Script was already sent over to pt's pharmacy. Pt thanked me and the call was ended.     Renaee Munda  (CJ)

## 2018-08-19 DIAGNOSIS — E291 Testicular hypofunction: Secondary | ICD-10-CM | POA: Diagnosis not present

## 2018-08-19 LAB — CULTURE, URINE: Isolate: >100000 — AB

## 2018-08-26 DIAGNOSIS — H1013 Acute atopic conjunctivitis, bilateral: Secondary | ICD-10-CM | POA: Diagnosis not present

## 2018-08-26 DIAGNOSIS — H25812 Combined forms of age-related cataract, left eye: Secondary | ICD-10-CM | POA: Diagnosis not present

## 2018-08-26 DIAGNOSIS — H35371 Puckering of macula, right eye: Secondary | ICD-10-CM | POA: Diagnosis not present

## 2018-08-26 DIAGNOSIS — H34831 Tributary (branch) retinal vein occlusion, right eye, with macular edema: Secondary | ICD-10-CM | POA: Diagnosis not present

## 2018-08-27 ENCOUNTER — Encounter

## 2018-08-27 ENCOUNTER — Inpatient Hospital Stay: Admit: 2018-08-27 | Discharge: 2018-08-27 | Payer: MEDICARE | Primary: Family Medicine

## 2018-08-27 DIAGNOSIS — N390 Urinary tract infection, site not specified: Secondary | ICD-10-CM

## 2018-08-27 NOTE — Telephone Encounter (Signed)
He took his last dose of Keflex last Tuesday but he feels that he still has a littlle more of the infection so he is requesting for more antibiotics. He said that the pharmacist told him not to take his temazepam with the Keflex but he forgot and took it for about 3 days and then realized that he should not be doing this. Pls call him today as he is planning to go out of town for the long weekend. He can be reached at [phone].

## 2018-08-27 NOTE — Telephone Encounter (Signed)
Spoke with pt, informed him since he has finished the abx, we will need him to have another culture. Pt states that is fine, he will have it done at the [facility]. I informed him that I will face the lab slip over. Pt thanked me and the call was ended.    Renaee Munda  (CJ)

## 2018-08-27 NOTE — Progress Notes (Signed)
Persistent UTI, likely ESBL but sensitivities still pending.   Will treat with Omnicef 300mg  po bid x 7 days.  Rx sent to pharmacy of choice, please notify patient to pick up and take as directed

## 2018-08-28 LAB — CULTURE, URINE: Isolate: >100000 — AB

## 2018-08-28 MED ORDER — CEFDINIR 300 MG CAP
300 mg | ORAL_CAPSULE | Freq: Two times a day (BID) | ORAL | 0 refills | Status: DC
Start: 2018-08-28 — End: 2018-12-07

## 2018-08-28 NOTE — Progress Notes (Signed)
Persistent UTI, likely ESBL but sensitivities still pending.   Will treat with Omnicef 300mg  po bid x 7 days.  Rx sent to pharmacy of choice, please notify patient to pick up and take as directed

## 2018-08-31 DIAGNOSIS — Z1283 Encounter for screening for malignant neoplasm of skin: Secondary | ICD-10-CM | POA: Diagnosis not present

## 2018-08-31 DIAGNOSIS — Z85828 Personal history of other malignant neoplasm of skin: Secondary | ICD-10-CM | POA: Diagnosis not present

## 2018-08-31 DIAGNOSIS — L570 Actinic keratosis: Secondary | ICD-10-CM | POA: Diagnosis not present

## 2018-08-31 DIAGNOSIS — D229 Melanocytic nevi, unspecified: Secondary | ICD-10-CM | POA: Diagnosis not present

## 2018-08-31 DIAGNOSIS — Z719 Counseling, unspecified: Secondary | ICD-10-CM | POA: Diagnosis not present

## 2018-08-31 DIAGNOSIS — L814 Other melanin hyperpigmentation: Secondary | ICD-10-CM | POA: Diagnosis not present

## 2018-08-31 DIAGNOSIS — L821 Other seborrheic keratosis: Secondary | ICD-10-CM | POA: Diagnosis not present

## 2018-08-31 DIAGNOSIS — L853 Xerosis cutis: Secondary | ICD-10-CM | POA: Diagnosis not present

## 2018-08-31 DIAGNOSIS — L818 Other specified disorders of pigmentation: Secondary | ICD-10-CM | POA: Diagnosis not present

## 2018-08-31 DIAGNOSIS — Z85820 Personal history of malignant melanoma of skin: Secondary | ICD-10-CM | POA: Diagnosis not present

## 2018-08-31 DIAGNOSIS — D1801 Hemangioma of skin and subcutaneous tissue: Secondary | ICD-10-CM | POA: Diagnosis not present

## 2018-08-31 DIAGNOSIS — D485 Neoplasm of uncertain behavior of skin: Secondary | ICD-10-CM | POA: Diagnosis not present

## 2018-09-01 DIAGNOSIS — H35371 Puckering of macula, right eye: Secondary | ICD-10-CM | POA: Diagnosis not present

## 2018-09-01 DIAGNOSIS — H35033 Hypertensive retinopathy, bilateral: Secondary | ICD-10-CM | POA: Diagnosis not present

## 2018-09-01 DIAGNOSIS — I10 Essential (primary) hypertension: Secondary | ICD-10-CM | POA: Diagnosis not present

## 2018-09-01 DIAGNOSIS — H2511 Age-related nuclear cataract, right eye: Secondary | ICD-10-CM | POA: Diagnosis not present

## 2018-09-01 DIAGNOSIS — H348312 Tributary (branch) retinal vein occlusion, right eye, stable: Secondary | ICD-10-CM | POA: Diagnosis not present

## 2018-09-01 DIAGNOSIS — H43812 Vitreous degeneration, left eye: Secondary | ICD-10-CM | POA: Diagnosis not present

## 2018-09-01 NOTE — Telephone Encounter (Signed)
Called pt to ensure he was able to start his omnicef 300mg  bid x 7 days abx as it was sent in over the holiday weekend. Pt said he started the abx on Friday, also informed me that he had passed two kidney stones over the weekend. Pt states he is starting to feel much better, thanked me, and the call was ended.    Renaee Munda  (CJ)

## 2018-09-02 DIAGNOSIS — E291 Testicular hypofunction: Secondary | ICD-10-CM | POA: Diagnosis not present

## 2018-09-08 DIAGNOSIS — R600 Localized edema: Secondary | ICD-10-CM | POA: Diagnosis not present

## 2018-09-09 DIAGNOSIS — H35371 Puckering of macula, right eye: Secondary | ICD-10-CM | POA: Diagnosis not present

## 2018-09-09 DIAGNOSIS — H25811 Combined forms of age-related cataract, right eye: Secondary | ICD-10-CM | POA: Diagnosis not present

## 2018-09-11 ENCOUNTER — Encounter

## 2018-09-14 ENCOUNTER — Inpatient Hospital Stay: Admit: 2018-09-14 | Payer: MEDICARE | Attending: Family | Primary: Family Medicine

## 2018-09-14 DIAGNOSIS — R600 Localized edema: Secondary | ICD-10-CM

## 2018-09-14 DIAGNOSIS — R7989 Other specified abnormal findings of blood chemistry: Secondary | ICD-10-CM | POA: Diagnosis not present

## 2018-09-14 DIAGNOSIS — I10 Essential (primary) hypertension: Secondary | ICD-10-CM | POA: Diagnosis not present

## 2018-09-15 DIAGNOSIS — H401131 Primary open-angle glaucoma, bilateral, mild stage: Secondary | ICD-10-CM | POA: Diagnosis not present

## 2018-09-16 DIAGNOSIS — E291 Testicular hypofunction: Secondary | ICD-10-CM | POA: Diagnosis not present

## 2018-09-28 DIAGNOSIS — H2511 Age-related nuclear cataract, right eye: Secondary | ICD-10-CM | POA: Diagnosis not present

## 2018-09-28 DIAGNOSIS — H35371 Puckering of macula, right eye: Secondary | ICD-10-CM | POA: Diagnosis not present

## 2018-09-28 DIAGNOSIS — H348312 Tributary (branch) retinal vein occlusion, right eye, stable: Secondary | ICD-10-CM | POA: Diagnosis not present

## 2018-09-28 DIAGNOSIS — H35031 Hypertensive retinopathy, right eye: Secondary | ICD-10-CM | POA: Diagnosis not present

## 2018-09-28 DIAGNOSIS — I10 Essential (primary) hypertension: Secondary | ICD-10-CM | POA: Diagnosis not present

## 2018-09-30 DIAGNOSIS — E291 Testicular hypofunction: Secondary | ICD-10-CM | POA: Diagnosis not present

## 2018-10-01 DIAGNOSIS — M7918 Myalgia, other site: Secondary | ICD-10-CM | POA: Diagnosis not present

## 2018-10-07 DIAGNOSIS — R748 Abnormal levels of other serum enzymes: Secondary | ICD-10-CM | POA: Diagnosis not present

## 2018-10-08 DIAGNOSIS — H401131 Primary open-angle glaucoma, bilateral, mild stage: Secondary | ICD-10-CM | POA: Diagnosis not present

## 2018-10-09 DIAGNOSIS — H2511 Age-related nuclear cataract, right eye: Secondary | ICD-10-CM | POA: Diagnosis not present

## 2018-10-09 DIAGNOSIS — Z01818 Encounter for other preprocedural examination: Secondary | ICD-10-CM | POA: Diagnosis not present

## 2018-10-12 DIAGNOSIS — H25811 Combined forms of age-related cataract, right eye: Secondary | ICD-10-CM | POA: Diagnosis not present

## 2018-10-12 DIAGNOSIS — H2511 Age-related nuclear cataract, right eye: Secondary | ICD-10-CM | POA: Diagnosis not present

## 2018-10-14 DIAGNOSIS — E291 Testicular hypofunction: Secondary | ICD-10-CM | POA: Diagnosis not present

## 2018-10-19 DIAGNOSIS — R600 Localized edema: Secondary | ICD-10-CM | POA: Diagnosis not present

## 2018-10-20 DIAGNOSIS — Z961 Presence of intraocular lens: Secondary | ICD-10-CM | POA: Diagnosis not present

## 2018-10-20 DIAGNOSIS — H35351 Cystoid macular degeneration, right eye: Secondary | ICD-10-CM | POA: Diagnosis not present

## 2018-10-22 DIAGNOSIS — H348312 Tributary (branch) retinal vein occlusion, right eye, stable: Secondary | ICD-10-CM | POA: Diagnosis not present

## 2018-10-22 DIAGNOSIS — H35371 Puckering of macula, right eye: Secondary | ICD-10-CM | POA: Diagnosis not present

## 2018-10-26 DIAGNOSIS — Z23 Encounter for immunization: Secondary | ICD-10-CM | POA: Diagnosis not present

## 2018-10-28 DIAGNOSIS — E291 Testicular hypofunction: Secondary | ICD-10-CM | POA: Diagnosis not present

## 2018-10-29 NOTE — Telephone Encounter (Signed)
Patient calling in ref to Testosterone  injection's he's been getting stated, once injected he's hurting the part of the buttock  and stated did not hurt before,please call to advise pt phone [phone]  Patient stated not getting the injections done at our office .

## 2018-10-30 NOTE — Telephone Encounter (Signed)
I spoke with pt who explained he would like to be followed by our office for his Low T. He stated at his last appt he discussed alternatives other than injections. He would like to know if we can do that here and follow T. I explained I would speak with Dr.Goldin and return call to discuss.

## 2018-10-30 NOTE — Telephone Encounter (Signed)
Left vm for return call to discuss.

## 2018-11-09 DIAGNOSIS — M542 Cervicalgia: Secondary | ICD-10-CM | POA: Diagnosis not present

## 2018-11-09 DIAGNOSIS — G250 Essential tremor: Secondary | ICD-10-CM | POA: Diagnosis not present

## 2018-11-09 DIAGNOSIS — I2510 Atherosclerotic heart disease of native coronary artery without angina pectoris: Secondary | ICD-10-CM | POA: Diagnosis not present

## 2018-11-09 DIAGNOSIS — E7800 Pure hypercholesterolemia, unspecified: Secondary | ICD-10-CM | POA: Diagnosis not present

## 2018-11-09 DIAGNOSIS — I10 Essential (primary) hypertension: Secondary | ICD-10-CM | POA: Diagnosis not present

## 2018-11-09 DIAGNOSIS — Z981 Arthrodesis status: Secondary | ICD-10-CM | POA: Diagnosis not present

## 2018-11-09 DIAGNOSIS — Z6823 Body mass index (BMI) 23.0-23.9, adult: Secondary | ICD-10-CM | POA: Diagnosis not present

## 2018-11-09 DIAGNOSIS — Z8507 Personal history of malignant neoplasm of pancreas: Secondary | ICD-10-CM | POA: Diagnosis not present

## 2018-11-09 NOTE — Telephone Encounter (Signed)
Left message for patient to move up appointment. Asked patient to return my call at [phone] ext [phone].     Thanks,  [employer]

## 2018-11-11 DIAGNOSIS — R600 Localized edema: Secondary | ICD-10-CM | POA: Diagnosis not present

## 2018-11-11 DIAGNOSIS — Z125 Encounter for screening for malignant neoplasm of prostate: Secondary | ICD-10-CM | POA: Diagnosis not present

## 2018-11-11 DIAGNOSIS — E291 Testicular hypofunction: Secondary | ICD-10-CM | POA: Diagnosis not present

## 2018-11-17 ENCOUNTER — Encounter

## 2018-11-17 ENCOUNTER — Inpatient Hospital Stay: Admit: 2018-11-17 | Payer: MEDICARE | Attending: Neurology | Primary: Family Medicine

## 2018-11-17 DIAGNOSIS — M47812 Spondylosis without myelopathy or radiculopathy, cervical region: Secondary | ICD-10-CM

## 2018-11-17 DIAGNOSIS — M4802 Spinal stenosis, cervical region: Secondary | ICD-10-CM | POA: Diagnosis not present

## 2018-11-17 DIAGNOSIS — S13170A Subluxation of C6/C7 cervical vertebrae, initial encounter: Secondary | ICD-10-CM | POA: Diagnosis not present

## 2018-11-17 DIAGNOSIS — M542 Cervicalgia: Secondary | ICD-10-CM | POA: Diagnosis not present

## 2018-11-17 DIAGNOSIS — G250 Essential tremor: Secondary | ICD-10-CM | POA: Diagnosis not present

## 2018-11-17 DIAGNOSIS — Z981 Arthrodesis status: Secondary | ICD-10-CM | POA: Diagnosis not present

## 2018-11-17 DIAGNOSIS — S13180A Subluxation of C7/T1 cervical vertebrae, initial encounter: Secondary | ICD-10-CM | POA: Diagnosis not present

## 2018-11-18 ENCOUNTER — Encounter

## 2018-11-18 NOTE — Telephone Encounter (Signed)
Patient called stated he feels likes he may have a UTI again. He would like to go to the [facility] in [place] to do a UA. He can be reached @ [phone].

## 2018-11-18 NOTE — Telephone Encounter (Signed)
Patient confirmed appt date and time. Patient thanked me for my help and the call was ended.    Thanks,  Siobhan

## 2018-11-18 NOTE — Telephone Encounter (Signed)
Spoke with pt, inquired what urinary symptoms he was experiencing. Pt reported that he was doing great and did not have any troubles. Pt denied frequency, urgency, pressure, burning, and odor. He informed me that he did not need to go have a culture done. I reminded him of his appt on 10/41m, pt thanked me and the call was ended.     Renaee Munda  ([place])

## 2018-11-19 DIAGNOSIS — H348312 Tributary (branch) retinal vein occlusion, right eye, stable: Secondary | ICD-10-CM | POA: Diagnosis not present

## 2018-11-19 DIAGNOSIS — H35371 Puckering of macula, right eye: Secondary | ICD-10-CM | POA: Diagnosis not present

## 2018-11-25 DIAGNOSIS — E291 Testicular hypofunction: Secondary | ICD-10-CM | POA: Diagnosis not present

## 2018-12-07 ENCOUNTER — Ambulatory Visit: Attending: Urology | Primary: Family Medicine

## 2018-12-07 ENCOUNTER — Ambulatory Visit: Admit: 2018-12-07 | Payer: MEDICARE | Attending: Urology | Primary: Family Medicine

## 2018-12-07 DIAGNOSIS — E291 Testicular hypofunction: Secondary | ICD-10-CM

## 2018-12-07 DIAGNOSIS — N200 Calculus of kidney: Secondary | ICD-10-CM | POA: Diagnosis not present

## 2018-12-07 MED ORDER — TESTOSTERONE 1.62 % (20.25 MG/1.25 GRAM) TRANSDERMAL GEL PUMP
20.25 mg/1.25 gram (1.62 %) | Freq: Every day | TRANSDERMAL | 2 refills | Status: DC
Start: 2018-12-07 — End: 2018-12-21

## 2018-12-07 NOTE — Progress Notes (Signed)
Tommy Brown  1945-04-07  Encounter date: 75/01/2019        Follow-up Visit      Encounter Diagnoses     ICD-10-CM ICD-9-CM   1. 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 Kidney stone  N20.0 592.0          ASSESSMENT & PLAN:   1.  Hypogonadism, Previously managed by Dr.  Daralene Milch, MD    Testosterone 04/30/18: 222 ng/dL    On TC injection q 2 weeks    Switch from TC injection to Androgel 1.62%- 1 pump daily- Rx provided    Will repeat T and CBC in 6 weeks     2. Kidney stone    S/p URS with LL in 02/2017   - Left 62mm proximal ureteral stone with hydroureteronephrosis and 65mm LLP stone  ????????????????????????S/p cystoscopy, left JJ stent on 06/22/2018 by Dr. Claiborne Billings    S/p Cystoscopy, left retrograde pyelogram, left ureteroscopy, holmium laser lithotripsy of proximal ureteral stone and extraction of left renal calculi and left double-J ureteral stent exchange on  07/31/18.   KUB  08/06/18: Left ureteral stent in good position. No ureteral or renal stone seen.     String stent removed on 08/06/18    Advised pt to do 24 hr urine study to assess stone formation etiologies     3. Hx of ED with IPP    4. Hx of Pancreatic cancer s/p Whipple in 2006, chemotherapy and radiation??  ??  Prostate Cancer Screening    Most recent PSA 04/30/18: 0.6 ng/mL    DRE today 12/07/18: 15-20 grams, benign     Follow-up and Dispositions    ?? Return for T and CBC in 6 weeks, f/u in 7 weeks (telehealth visit) to review .         Chief Complaint   Patient presents with   ??? Hypogonadism        HPI: Tommy Brown is a 73 y.o. WHITE OR CAUCASIAN male  who presents today in follow up for an established diagnosis of hypogonadism. Pt was last seen on 08/06/18 fr kidney stone. Pt is s/p Cystoscopy, left retrograde pyelogram, left ureteroscopy, holmium laser lithotripsy of proximal ureteral stone and extraction of left renal calculi and left double-J ureteral stent exchange on 07/31/18. Stent was removed on 08/06/18      Patient is on TC injections q 2 weeks via his PCP. Last injection was on 11/25/18.  Pt report worsening voiding Sx. Nocturia x1.      H/o pancreatic cancer.     LABS / IMAGING:    Lab Results   Component Value Date/Time    Prostate Specific Ag 0.6 04/30/2018    Prostate Specific Ag 0.5 08/25/2013       AUA Assessment Score:   ;    AUA Bother Rating:      Current Outpatient Medications   Medication Sig Dispense Refill   ??? bimatoprost (Lumigan) 0.01 % ophthalmic drops Lumigan 0.01 % eye drops     ??? promethazine (PHENERGAN) 12.5 mg tablet promethazine 12.5 mg tablet   TAKE 1 TABLET BY MOUTH 2 TIMES PER DAY AS NEEDED     ??? testosterone cypionate (DEPOTESTOTERONE CYPIONATE) 200 mg/mL injection INJECT 0.5 MILLILITER INTO THE MUSCLE EVERY 2 WEEKS DISCARD VIAL AFTER USE     ??? testosterone (ANDROGEL) 20.25 mg/1.25 gram (1.62 %) gel Apply 20 mg to affected area daily. Max Daily Amount: 20 mg. 6 Bottle 2   ???  metoprolol succinate (TOPROL-XL) 25 mg XL tablet TAKE 1 TABLET BY MOUTH EVERY DAY     ??? ketorolac (TORADOL) 10 mg tablet Take 1 Tab by mouth three (3) times daily as needed for Pain. 10 Tab 0   ??? lipase-protease-amylase (Creon) 24,000-76,000 -120,000 unit capsule Take  by mouth.     ??? lipase-protease-amylase (Creon) 24,000-76,000 -120,000 unit capsule Take  by mouth daily as needed (with snacks and meals).     ??? temazepam (RESTORIL) 7.5 mg capsule Take  by mouth nightly.     ??? tamsulosin (Flomax) 0.4 mg capsule Take 2 Caps by mouth daily. 60 Cap 1   ??? lactobacillus rhamnosus gg 10 billion cell (Culturelle) 10 billion cell capsule Take 1 Cap by mouth Before breakfast and dinner. 40 Cap 0   ??? peg 400-propylene glycol (Systane, propylene glycol,) 0.4-0.3 % drop Administer 2 Drops to both eyes daily. Patient places two drops in both eyes every AM.   Indications: dry eye     ??? metoprolol tartrate (LOPRESSOR) 25 mg tablet Take 25 mg by mouth daily.      ??? gabapentin (NEURONTIN) 600 mg tablet Take 600 mg by mouth two (2) times a day.     ??? amLODIPine (NORVASC) 5 mg tablet Take 5 mg by mouth daily.     ??? allopurinol (ZYLOPRIM) 300 mg tablet Take  by mouth daily.     ??? aspirin 81 mg chewable tablet Take 81 mg by mouth daily.     ??? TESTOSTERONE IM by IntraMUSCular route.         All:  Allergies   Allergen Reactions   ??? Other Food Other (comments)     VISTAID  ANESTHESIA   ??? Codeine Itching and Swelling   ??? Oxycodone Anaphylaxis   ??? Hydroxyzine Pamoate Itching and Other (comments)     Other reaction(s): Unknown     ??? Meperidine Itching, Palpitations, Swelling and Other (comments)     Other reaction(s): Unknown     ??? Metoclopramide Itching, Other (comments) and Swelling     Other reaction(s): Other (See Comments)  Reaction unknown   Reaction unknown      ??? Morphine Other (comments) and Nausea and Vomiting   ??? Oxycodone-Acetaminophen Itching   ??? Prochlorperazine Itching, Other (comments) and Unknown (comments)     Other reaction(s): Unknown  Unsure of reaction     ??? Hydroxyzine Hcl Hives       Past Medical History:   Diagnosis Date   ??? Arthritis    ??? Benign prostate hyperplasia    ??? Bladder infection    ??? Chronic kidney disease    ??? Coronary arteriosclerosis    ??? Coronary atherosclerosis of artery bypass graft    ??? Decreased testosterone level    ??? Diverticular disease    ??? Essential hypertension    ??? Gout    ??? History of acute renal failure    ??? History of anemia    ??? History of malignant neoplasm of pancreas    ??? Hx of CABG    ??? Kidney stone    ??? Kidney stones    ??? Neuropathy    ??? Pancreatic cancer ([place])    ??? Sepsis ([place])    ??? Tremor        Past Surgical History:   Procedure Laterality Date   ??? HX CERVICAL FUSION     ??? HX COLONOSCOPY     ??? HX CORONARY ARTERY BYPASS GRAFT  1995   ???  HX HEART CATHETERIZATION  2006   ??? HX ORTHOPAEDIC      back   ??? HX OTHER SURGICAL  2006    whipple procedure done for pancreatic cancer   ??? HX UROLOGICAL  2012     Percutaneous extraction of a kidney stone w/ fragmentation procedure        Review of Systems  Constitutional: Fever: No  Skin: Rash: No  HEENT: Hearing difficulty: No  Eyes: Blurred vision: No  Cardiovascular: Chest pain: No  Respiratory: Shortness of breath: No  Gastrointestinal: Nausea/vomiting: No  Musculoskeletal: Back pain: No  Neurological: Weakness: No  Psychological: Memory loss: No  Comments/additional findings:     Any elements of the PMFSHx, ROS, or preliminary elements of the HPI that were entered by a medical assistant have been reviewed in full.    PHYSICAL EXAM:    Visit Vitals  Ht 5\' 8"  (1.727 m)   Wt 135 lb (61.2 kg)   BMI 20.53 kg/m??     Constitutional: WDWN, Pleasant and appropriate affect, No acute distress.    CV:  No peripheral swelling noted  Respiratory: No respiratory distress or difficulties  Abdomen:  No abdominal masses or tenderness.  No CVA tenderness. No hernias noted.   GU Male: 12/07/18   [id number] normal to visual inspection, no erythema or irritation, Sphincter with good tone, Rectum with no hemorrhoids, fissures or masses.  Prostate is 15-20 grams, smooth, no nodule.   SCROTUM:  No scrotal rash or lesions noticed.  Normal bilateral testes and epididymis.   PENIS: Urethral meatus normal in location and size. No urethral discharge. IPP   Skin: No jaundice.    Neuro/Psych:  Alert and oriented x 3. Affect appropriate.   Lymphatic:   No enlarged inguinal lymph nodes.        Body mass index is 20.53 kg/m??. Patient's BMI is out of the normal parameters.  Information about BMI was given and patient was advised to follow-up with their PCP for further management.     Labs Today:  No results found for any visits on 12/07/18.     Retia Passe, M.D., F.A.C.S.    Documentation provided by Fortino Sic, medical scribe for Retia Passe, M.D., F.A.C.S.

## 2018-12-07 NOTE — Progress Notes (Signed)
Progress Notes by Retia Passe, MD at 12/07/18 1445                Author: Retia Passe, MD  Service: --  Author Type: Physician       Filed: 12/07/18 1641  Encounter Date: 12/07/2018  Status: Signed          Editor: Retia Passe, MD (Physician)               Tommy Brown   [birth year]   Encounter date: 12/07/2018              Follow-up Visit           Encounter Diagnoses              ICD-10-CM  ICD-9-CM          1.  Hypogonadism in male   E29.1  257.2          2.  Kidney stone   N20.0  592.0               ASSESSMENT & PLAN:    1.  Hypogonadism, Previously managed by Dr.  Daralene Milch, MD     Testosterone 04/30/18: 222 ng/dL     On TC injection q 2 weeks     Switch from TC injection to Androgel 1.62%- 1 pump daily- Rx provided     Will repeat T and CBC in 6 weeks       2. Kidney stone     S/p URS with LL in 02/2017    - Left 16mm proximal ureteral stone with hydroureteronephrosis and 74mm LLP stone   ????????????????????????S/p cystoscopy, left JJ stent on 06/22/2018 by Dr. Claiborne Billings     S/p Cystoscopy, left retrograde pyelogram, left ureteroscopy, holmium laser lithotripsy of proximal ureteral stone and extraction of left renal calculi and left double-J ureteral stent exchange on  07/31/18.    KUB  08/06/18: Left ureteral stent in good position. No ureteral or renal stone seen.      String stent removed on 08/06/18     Advised pt to do 24 hr urine study to assess stone formation etiologies       3. Hx of ED with IPP      4. Hx of Pancreatic cancer s/p Whipple in 2006, chemotherapy and radiation??   ??   Prostate Cancer Screening     Most recent PSA 04/30/18: 0.6 ng/mL     DRE today 12/07/18: 15-20 grams, benign         Follow-up and Dispositions      ??  Return for T and CBC in 6 weeks, f/u in 7 weeks (telehealth visit) to review .                  Chief Complaint       Patient presents with        ?  Hypogonadism            HPI: Tommy Brown is a  73 y.o. WHITE OR CAUCASIAN male   who presents today in  follow up for an established diagnosis of hypogonadism. Pt was last seen on 08/06/18 fr kidney stone. Pt is s/p Cystoscopy, left retrograde pyelogram, left ureteroscopy, holmium laser lithotripsy of proximal ureteral stone and extraction  of left renal calculi and left double-J ureteral stent exchange on 07/31/18. Stent was removed on 08/06/18       Patient is on TC injections  q 2 weeks via his PCP. Last injection was on 11/25/18.   Pt report worsening voiding Sx. Nocturia x1.        H/o pancreatic cancer.       LABS / IMAGING:        Lab Results         Component  Value  Date/Time            Prostate Specific Ag  0.6  04/30/2018            Prostate Specific Ag  0.5  08/25/2013           AUA Assessment Score:    ;     AUA Bother Rating:          Current Outpatient Medications          Medication  Sig  Dispense  Refill           ?  bimatoprost (Lumigan) 0.01 % ophthalmic drops  Lumigan 0.01 % eye drops         ?  promethazine (PHENERGAN) 12.5 mg tablet  promethazine 12.5 mg tablet    TAKE 1 TABLET BY MOUTH 2 TIMES PER DAY AS NEEDED         ?  testosterone cypionate (DEPOTESTOTERONE CYPIONATE) 200 mg/mL injection  INJECT 0.5 MILLILITER INTO THE MUSCLE EVERY 2 WEEKS DISCARD VIAL AFTER USE         ?  testosterone (ANDROGEL) 20.25 mg/1.25 gram (1.62 %) gel  Apply 20 mg to affected area daily. Max Daily Amount: 20 mg.  6 Bottle  2     ?  metoprolol succinate (TOPROL-XL) 25 mg XL tablet  TAKE 1 TABLET BY MOUTH EVERY DAY         ?  ketorolac (TORADOL) 10 mg tablet  Take 1 Tab by mouth three (3) times daily as needed for Pain.  10 Tab  0     ?  lipase-protease-amylase (Creon) 24,000-76,000 -120,000 unit capsule  Take  by mouth.         ?  lipase-protease-amylase (Creon) 24,000-76,000 -120,000 unit capsule  Take  by mouth daily as needed (with snacks and meals).         ?  temazepam (RESTORIL) 7.5 mg capsule  Take  by mouth nightly.         ?  tamsulosin (Flomax) 0.4 mg capsule  Take 2 Caps by mouth daily.  60 Cap  1           ?   lactobacillus rhamnosus gg 10 billion cell (Culturelle) 10 billion cell capsule  Take 1 Cap by mouth Before breakfast and dinner.  40 Cap  0           ?  peg 400-propylene glycol (Systane, propylene glycol,) 0.4-0.3 % drop  Administer 2 Drops to both eyes daily. Patient places two drops in both eyes every AM.   Indications: dry eye         ?  metoprolol tartrate (LOPRESSOR) 25 mg tablet  Take 25 mg by mouth daily.         ?  gabapentin (NEURONTIN) 600 mg tablet  Take 600 mg by mouth two (2) times a day.         ?  amLODIPine (NORVASC) 5 mg tablet  Take 5 mg by mouth daily.         ?  allopurinol (ZYLOPRIM) 300 mg tablet  Take  by mouth daily.         ?  aspirin 81 mg chewable tablet  Take 81 mg by mouth daily.               ?  TESTOSTERONE IM  by IntraMUSCular route.               All:     Allergies        Allergen  Reactions         ?  Other Food  Other (comments)             VISTAID   ANESTHESIA         ?  Codeine  Itching and Swelling     ?  Oxycodone  Anaphylaxis     ?  Hydroxyzine Pamoate  Itching and Other (comments)             Other reaction(s): Unknown            ?  Meperidine  Itching, Palpitations, Swelling and Other (comments)             Other reaction(s): Unknown            ?  Metoclopramide  Itching, Other (comments) and Swelling             Other reaction(s): Other (See Comments)   Reaction unknown    Reaction unknown             ?  Morphine  Other (comments) and Nausea and Vomiting     ?  Oxycodone-Acetaminophen  Itching     ?  Prochlorperazine  Itching, Other (comments) and Unknown (comments)             Other reaction(s): Unknown   Unsure of reaction            ?  Hydroxyzine Hcl  Hives             Past Medical History:        Diagnosis  Date         ?  Arthritis       ?  Benign prostate hyperplasia       ?  Bladder infection       ?  Chronic kidney disease       ?  Coronary arteriosclerosis       ?  Coronary atherosclerosis of artery bypass graft       ?  Decreased testosterone level       ?   Diverticular disease       ?  Essential hypertension       ?  Gout       ?  History of acute renal failure       ?  History of anemia       ?  History of malignant neoplasm of pancreas       ?  Hx of CABG       ?  Kidney stone       ?  Kidney stones       ?  Neuropathy       ?  Pancreatic cancer ([place])       ?  Sepsis ([place])           ?  Tremor               Past Surgical History:         Procedure  Laterality  Date          ?  HX CERVICAL FUSION         ?  HX COLONOSCOPY         ?  HX CORONARY ARTERY BYPASS GRAFT    1995     ?  HX HEART CATHETERIZATION    2006     ?  HX ORTHOPAEDIC              back          ?  HX OTHER SURGICAL    2006          whipple procedure done for pancreatic cancer          ?  HX UROLOGICAL    2012          Percutaneous extraction of a kidney stone w/ fragmentation procedure            Review of Systems   Constitutional: Fever: No   Skin: Rash: No   HEENT: Hearing difficulty: No   Eyes: Blurred vision: No   Cardiovascular: Chest pain: No   Respiratory: Shortness of breath: No   Gastrointestinal: Nausea/vomiting: No   Musculoskeletal: Back pain: No   Neurological: Weakness: No   Psychological: Memory loss: No   Comments/additional findings:       Any elements of the PMFSHx, ROS, or preliminary elements of the HPI that were entered by a medical assistant have been reviewed in full.      PHYSICAL EXAM:     Visit Vitals      Ht  5\' 8"  (1.727 m)     Wt  135 lb (61.2 kg)        BMI  20.53 kg/m??        Constitutional: WDWN, Pleasant and appropriate affect, No acute distress .     CV:  No peripheral swelling noted   Respiratory: No respiratory distress or difficulties   Abdomen:  No abdominal masses or tenderness.   No CVA tenderness. No hernias noted.    GU Male: 12/07/18    [id number] normal to visual inspection, no erythema or irritation, Sphincter with good tone, Rectum with  no hemorrhoids, fissures or masses.  Prostate is 15-20 grams, smooth, no nodule.    SCROTUM:  No scrotal rash or lesions  noticed.  Normal bilateral testes and epididymis.    PENIS: Urethral meatus normal in location and size. No urethral discharge . IPP    Skin: No jaundice.     Neuro/Psych:  Alert and o riented x 3. Affect  appropriate.    Lymphatic:   No enlarged inguinal lymph nodes.           Body mass index is 20.53 kg/m??. Patient's BMI is out of the normal parameters.  Information about BMI was given and patient was advised to  follow-up with their PCP for further management.       Labs Today:   No results found for any visits on 12/07/18.       Retia Passe, M.D., F.A.C.S.      Documentation provided by Fortino Sic, medical scribe for Retia Passe, M.D., F.A.C.S.

## 2018-12-09 DIAGNOSIS — E291 Testicular hypofunction: Secondary | ICD-10-CM | POA: Diagnosis not present

## 2018-12-09 NOTE — Telephone Encounter (Signed)
Received fax from [facility] in regards to testosterone, Medication requires PA. Message sent to nurse for review.

## 2018-12-17 DIAGNOSIS — R944 Abnormal results of kidney function studies: Secondary | ICD-10-CM | POA: Diagnosis not present

## 2018-12-17 DIAGNOSIS — D649 Anemia, unspecified: Secondary | ICD-10-CM | POA: Diagnosis not present

## 2018-12-18 ENCOUNTER — Telehealth

## 2018-12-18 ENCOUNTER — Encounter

## 2018-12-18 NOTE — Telephone Encounter (Signed)
Please call [facility] to discuss PA for for testosterone.  The number to dial is [phone].  Was told to speak with any available agent, providing pt's info.

## 2018-12-18 NOTE — Telephone Encounter (Signed)
[facility] [place] health plan called requesting a call back re: PA for androgel, they need to know what other meds patient has tried and failed. Thank you.

## 2018-12-21 MED ORDER — TESTOSTERONE 1.62 % (20.25 MG/1.25 GRAM) TRANSDERMAL GEL PUMP
20.25 mg/1.25 gram (1.62 %) | Freq: Every day | TRANSDERMAL | 2 refills | Status: DC
Start: 2018-12-21 — End: 2020-04-03

## 2018-12-21 NOTE — Telephone Encounter (Signed)
Spoke with [facility] I regards to pt Prior Auth. Testosterone gel not covered under insurance and Generic will still require prior Auth. I spoke with pt and at this point he would like to continue injections as this is easier for him.

## 2018-12-23 DIAGNOSIS — E291 Testicular hypofunction: Secondary | ICD-10-CM | POA: Diagnosis not present

## 2018-12-23 NOTE — Telephone Encounter (Signed)
Received call from Magnolia from [facility] regarding the testosterone- she advised that patient's insurance will cover Androderm patch 4 mg/ 24 hr or testosterone gel 1%.    Her [phone]

## 2018-12-23 NOTE — Telephone Encounter (Signed)
Left vm for return call.

## 2018-12-23 NOTE — Telephone Encounter (Signed)
Pt would prefer to stay on T injections.

## 2018-12-29 DIAGNOSIS — H348312 Tributary (branch) retinal vein occlusion, right eye, stable: Secondary | ICD-10-CM | POA: Diagnosis not present

## 2018-12-30 DIAGNOSIS — R748 Abnormal levels of other serum enzymes: Secondary | ICD-10-CM | POA: Diagnosis not present

## 2018-12-30 DIAGNOSIS — R7309 Other abnormal glucose: Secondary | ICD-10-CM | POA: Diagnosis not present

## 2019-01-06 DIAGNOSIS — E291 Testicular hypofunction: Secondary | ICD-10-CM | POA: Diagnosis not present

## 2019-01-10 ENCOUNTER — Inpatient Hospital Stay
Admit: 2019-01-10 | Discharge: 2019-01-20 | Disposition: A | Discharge status: Home / Self Care | Payer: MEDICARE | Attending: Internal Medicine | Admitting: Internal Medicine

## 2019-01-10 ENCOUNTER — Emergency Department: Admit: 2019-01-10 | Payer: MEDICARE | Primary: Family Medicine

## 2019-01-10 DIAGNOSIS — U071 COVID-19: Principal | ICD-10-CM

## 2019-01-10 DIAGNOSIS — Z90411 Acquired partial absence of pancreas: Secondary | ICD-10-CM | POA: Diagnosis not present

## 2019-01-10 DIAGNOSIS — C259 Malignant neoplasm of pancreas, unspecified: Secondary | ICD-10-CM | POA: Diagnosis not present

## 2019-01-10 DIAGNOSIS — N390 Urinary tract infection, site not specified: Secondary | ICD-10-CM | POA: Diagnosis not present

## 2019-01-10 DIAGNOSIS — R110 Nausea: Secondary | ICD-10-CM | POA: Diagnosis not present

## 2019-01-10 DIAGNOSIS — I2510 Atherosclerotic heart disease of native coronary artery without angina pectoris: Secondary | ICD-10-CM | POA: Diagnosis not present

## 2019-01-10 DIAGNOSIS — A419 Sepsis, unspecified organism: Secondary | ICD-10-CM | POA: Diagnosis not present

## 2019-01-10 DIAGNOSIS — N281 Cyst of kidney, acquired: Secondary | ICD-10-CM | POA: Diagnosis not present

## 2019-01-10 DIAGNOSIS — N400 Enlarged prostate without lower urinary tract symptoms: Secondary | ICD-10-CM | POA: Diagnosis present

## 2019-01-10 DIAGNOSIS — R161 Splenomegaly, not elsewhere classified: Secondary | ICD-10-CM | POA: Diagnosis not present

## 2019-01-10 DIAGNOSIS — E291 Testicular hypofunction: Secondary | ICD-10-CM | POA: Diagnosis not present

## 2019-01-10 DIAGNOSIS — Z79899 Other long term (current) drug therapy: Secondary | ICD-10-CM | POA: Diagnosis not present

## 2019-01-10 DIAGNOSIS — Z951 Presence of aortocoronary bypass graft: Secondary | ICD-10-CM | POA: Diagnosis not present

## 2019-01-10 DIAGNOSIS — J9600 Acute respiratory failure, unspecified whether with hypoxia or hypercapnia: Secondary | ICD-10-CM | POA: Diagnosis not present

## 2019-01-10 DIAGNOSIS — R05 Cough: Secondary | ICD-10-CM | POA: Diagnosis not present

## 2019-01-10 DIAGNOSIS — B953 Streptococcus pneumoniae as the cause of diseases classified elsewhere: Secondary | ICD-10-CM | POA: Diagnosis not present

## 2019-01-10 DIAGNOSIS — Z8507 Personal history of malignant neoplasm of pancreas: Secondary | ICD-10-CM | POA: Diagnosis not present

## 2019-01-10 DIAGNOSIS — B961 Klebsiella pneumoniae [K. pneumoniae] as the cause of diseases classified elsewhere: Secondary | ICD-10-CM | POA: Diagnosis not present

## 2019-01-10 DIAGNOSIS — B9620 Unspecified Escherichia coli [E. coli] as the cause of diseases classified elsewhere: Secondary | ICD-10-CM | POA: Diagnosis present

## 2019-01-10 DIAGNOSIS — C329 Malignant neoplasm of larynx, unspecified: Secondary | ICD-10-CM | POA: Diagnosis not present

## 2019-01-10 DIAGNOSIS — I25810 Atherosclerosis of coronary artery bypass graft(s) without angina pectoris: Secondary | ICD-10-CM | POA: Diagnosis not present

## 2019-01-10 DIAGNOSIS — J9601 Acute respiratory failure with hypoxia: Secondary | ICD-10-CM | POA: Diagnosis not present

## 2019-01-10 DIAGNOSIS — J1289 Other viral pneumonia: Secondary | ICD-10-CM | POA: Diagnosis not present

## 2019-01-10 DIAGNOSIS — Z20828 Contact with and (suspected) exposure to other viral communicable diseases: Secondary | ICD-10-CM | POA: Diagnosis not present

## 2019-01-10 DIAGNOSIS — Z7982 Long term (current) use of aspirin: Secondary | ICD-10-CM | POA: Diagnosis not present

## 2019-01-10 DIAGNOSIS — I10 Essential (primary) hypertension: Secondary | ICD-10-CM | POA: Diagnosis not present

## 2019-01-10 DIAGNOSIS — J111 Influenza due to unidentified influenza virus with other respiratory manifestations: Secondary | ICD-10-CM | POA: Diagnosis not present

## 2019-01-10 DIAGNOSIS — R52 Pain, unspecified: Secondary | ICD-10-CM | POA: Diagnosis not present

## 2019-01-10 DIAGNOSIS — R509 Fever, unspecified: Secondary | ICD-10-CM | POA: Diagnosis not present

## 2019-01-10 DIAGNOSIS — Z981 Arthrodesis status: Secondary | ICD-10-CM | POA: Diagnosis not present

## 2019-01-10 DIAGNOSIS — J189 Pneumonia, unspecified organism: Secondary | ICD-10-CM | POA: Diagnosis not present

## 2019-01-10 DIAGNOSIS — G629 Polyneuropathy, unspecified: Secondary | ICD-10-CM | POA: Diagnosis not present

## 2019-01-10 DIAGNOSIS — J13 Pneumonia due to Streptococcus pneumoniae: Secondary | ICD-10-CM | POA: Diagnosis not present

## 2019-01-10 DIAGNOSIS — R918 Other nonspecific abnormal finding of lung field: Secondary | ICD-10-CM | POA: Diagnosis not present

## 2019-01-10 DIAGNOSIS — D61818 Other pancytopenia: Secondary | ICD-10-CM | POA: Diagnosis not present

## 2019-01-10 DIAGNOSIS — M109 Gout, unspecified: Secondary | ICD-10-CM | POA: Diagnosis not present

## 2019-01-10 DIAGNOSIS — Z87448 Personal history of other diseases of urinary system: Secondary | ICD-10-CM | POA: Diagnosis not present

## 2019-01-10 DIAGNOSIS — Z87442 Personal history of urinary calculi: Secondary | ICD-10-CM | POA: Diagnosis not present

## 2019-01-10 LAB — METABOLIC PANEL, COMPREHENSIVE
ALT (SGPT): 28 U/L (ref 12–78)
ALT (SGPT): 27 U/L (ref 12–78)
AST (SGOT): 58 U/L — ABNORMAL HIGH (ref 15–37)
AST (SGOT): 64 U/L — ABNORMAL HIGH (ref 15–37)
Albumin: 3 gm/dl — ABNORMAL LOW (ref 3.4–5.0)
Albumin: 2.6 gm/dl — ABNORMAL LOW (ref 3.4–5.0)
Alk. phosphatase: 135 U/L — ABNORMAL HIGH (ref 45–117)
Alk. phosphatase: 118 U/L — ABNORMAL HIGH (ref 45–117)
Anion gap: 8 mmol/L (ref 5–15)
Anion gap: 9 mmol/L (ref 5–15)
BUN: 11 mg/dl (ref 7–25)
BUN: 10 mg/dl (ref 7–25)
Bilirubin, total: 0.6 mg/dl (ref 0.2–1.0)
Bilirubin, total: 0.6 mg/dl (ref 0.2–1.0)
CO2: 24 mEq/L (ref 21–32)
CO2: 22 mEq/L (ref 21–32)
Calcium: 8.4 mg/dl — ABNORMAL LOW (ref 8.5–10.1)
Calcium: 8.2 mg/dl — ABNORMAL LOW (ref 8.5–10.1)
Chloride: 106 mEq/L (ref 98–107)
Chloride: 107 mEq/L (ref 98–107)
Creatinine: 1.3 mg/dl (ref 0.6–1.3)
Creatinine: 1.2 mg/dl (ref 0.6–1.3)
GFR est AA: >60
GFR est AA: >60
GFR est non-AA: 57
GFR est non-AA: >60
Glucose: 116 mg/dl — ABNORMAL HIGH (ref 74–106)
Glucose: 131 mg/dl — ABNORMAL HIGH (ref 74–106)
Potassium: 3.7 mEq/L (ref 3.5–5.1)
Potassium: 4 mEq/L (ref 3.5–5.1)
Protein, total: 7.9 gm/dl (ref 6.4–8.2)
Protein, total: 7.2 gm/dl (ref 6.4–8.2)
Sodium: 138 mEq/L (ref 136–145)
Sodium: 138 mEq/L (ref 136–145)

## 2019-01-10 LAB — CBC WITH AUTOMATED DIFF
HCT: 34.8 % — ABNORMAL LOW (ref 37.0–50.0)
HCT: 32.4 % — ABNORMAL LOW (ref 37.0–50.0)
HGB: 10.6 gm/dl — ABNORMAL LOW (ref 12.4–17.2)
HGB: 10.1 gm/dl — ABNORMAL LOW (ref 12.4–17.2)
LYMPHOCYTES: 15.8 % — ABNORMAL LOW (ref 28–48)
LYMPHOCYTES: 10.3 % — ABNORMAL LOW (ref 28–48)
MCH: 22.8 pg — ABNORMAL LOW (ref 23.0–34.6)
MCH: 23.2 pg (ref 23.0–34.6)
MCHC: 30.5 gm/dl (ref 30.0–36.0)
MCHC: 31.2 gm/dl (ref 30.0–36.0)
MCV: 74.8 fL — ABNORMAL LOW (ref 80.0–98.0)
MCV: 74.5 fL — ABNORMAL LOW (ref 80.0–98.0)
MONOCYTES: 5.1 % (ref 1–13)
MONOCYTES: 2.4 % (ref 1–13)
MPV: 0 fL — ABNORMAL LOW (ref 6.0–10.0)
NEUTROPHILS: 78.5 % — ABNORMAL HIGH (ref 34–64)
NEUTROPHILS: 86.7 % — ABNORMAL HIGH (ref 34–64)
PLATELET: 131 10*3/uL — ABNORMAL LOW (ref 140–450)
PLATELET: 128 10*3/uL — ABNORMAL LOW (ref 140–450)
RBC: 4.65 M/uL (ref 3.80–5.70)
RBC: 4.35 M/uL (ref 3.80–5.70)
RDW-SD: 58.6 — ABNORMAL HIGH (ref 35.1–43.9)
WBC: 3.4 10*3/uL — ABNORMAL LOW (ref 4.0–11.0)
WBC: 3.4 10*3/uL — ABNORMAL LOW (ref 4.0–11.0)

## 2019-01-10 LAB — LEGIONELLA PNEUMOPHILA AG, URINE: Legionella Ag, urine: NEGATIVE

## 2019-01-10 LAB — ANTIBODY SCREEN
Antibody Screen: NEGATIVE
Antibody screen: NEGATIVE

## 2019-01-10 LAB — POC URINE MACROSCOPIC
Glucose, Ur: NEGATIVE mg/dl
Glucose: NEGATIVE mg/dl
Leukocyte Esterase, Urine: NEGATIVE
Leukocyte Esterase: NEGATIVE
Nitrite, Urine: NEGATIVE
Nitrites: NEGATIVE
Protein, UA: 100 mg/dl — AB
Protein: 100 mg/dl — AB
Specific Gravity, UA: 1.025 (ref 1.005–1.030)
Specific gravity: 1.025 (ref 1.005–1.030)
Urobilinogen, UA, POCT: 0.2 EU/dl (ref 0.0–1.0)
Urobilinogen: 0.2 EU/dl (ref 0.0–1.0)
pH (UA): 5.5 (ref 5–9)
pH, UA: 5.5 (ref 5–9)

## 2019-01-10 LAB — LACTIC ACID
LACTIC ACID: 2 mmol/L (ref 0.4–2.0)
LACTIC ACID: 2.2 mmol/L — ABNORMAL HIGH (ref 0.4–2.0)
Lactic Acid: 2 mmol/L (ref 0.4–2.0)
Lactic Acid: 2.2 mmol/L — ABNORMAL HIGH (ref 0.4–2.0)

## 2019-01-10 LAB — CK
CK: 68 U/L (ref 39–308)
Total CK: 68 U/L (ref 39–308)

## 2019-01-10 LAB — TROPONIN I: Troponin-I: 0.026 ng/ml (ref 0.000–0.045)

## 2019-01-10 LAB — PROCALCITONIN
PROCALCITONIN: 0.08 ng/ml (ref 0.00–0.50)
PROCALCITONIN: 0.08 ng/ml (ref 0.00–0.50)

## 2019-01-10 LAB — FERRITIN
Ferritin: 137.3 ng/mL (ref 26.0–388.0)
Ferritin: 137.3 ng/ml (ref 26.0–388.0)

## 2019-01-10 LAB — BLOOD TYPE, (ABO+RH)
ABO/Rh(D): A NEG
ABO/Rh: A NEG

## 2019-01-10 LAB — S.PNEUMO AG, UR/CSF
Strep pneumo Ag, urine: POSITIVE — AB
Strep pneumo Ag, urine: POSITIVE — AB

## 2019-01-10 LAB — FIBRINOGEN
Fibrinogen: 420 mg/dl — ABNORMAL HIGH (ref 220–397)
Fibrinogen: 420 mg/dl — ABNORMAL HIGH (ref 220–397)

## 2019-01-10 LAB — LD: LD: 358 U/L — ABNORMAL HIGH (ref 87–241)

## 2019-01-10 LAB — D DIMER: D DIMER: 0.63 ug/mL (FEU) — ABNORMAL HIGH (ref 0.01–0.50)

## 2019-01-10 LAB — GLUCOSE, POC: Glucose (POC): 126 mg/dL — ABNORMAL HIGH (ref 65–105)

## 2019-01-10 LAB — C REACTIVE PROTEIN, QT: C-Reactive protein: 137 mg/L — ABNORMAL HIGH (ref 0.0–2.9)

## 2019-01-10 LAB — SARS-COV-2_FLU: COVID-19: POSITIVE — AB

## 2019-01-10 LAB — COMPREHENSIVE METABOLIC PANEL
ALT: 28 U/L (ref 12–78)
ALT: 27 U/L (ref 12–78)
AST: 58 U/L — ABNORMAL HIGH (ref 15–37)
AST: 64 U/L — ABNORMAL HIGH (ref 15–37)
Albumin: 3 gm/dl — ABNORMAL LOW (ref 3.4–5.0)
Albumin: 2.6 gm/dl — ABNORMAL LOW (ref 3.4–5.0)
Alkaline Phosphatase: 135 U/L — ABNORMAL HIGH (ref 45–117)
Alkaline Phosphatase: 118 U/L — ABNORMAL HIGH (ref 45–117)
Anion Gap: 8 mmol/L (ref 5–15)
Anion Gap: 9 mmol/L (ref 5–15)
BUN: 11 mg/dl (ref 7–25)
BUN: 10 mg/dl (ref 7–25)
CO2: 24 mEq/L (ref 21–32)
CO2: 22 mEq/L (ref 21–32)
Calcium: 8.4 mg/dl — ABNORMAL LOW (ref 8.5–10.1)
Calcium: 8.2 mg/dl — ABNORMAL LOW (ref 8.5–10.1)
Chloride: 106 mEq/L (ref 98–107)
Chloride: 107 mEq/L (ref 98–107)
Creatinine: 1.3 mg/dl (ref 0.6–1.3)
Creatinine: 1.2 mg/dl (ref 0.6–1.3)
EGFR IF NonAfrican American: 57
EGFR IF NonAfrican American: >60
GFR African American: >60
GFR African American: >60
Glucose: 116 mg/dl — ABNORMAL HIGH (ref 74–106)
Glucose: 131 mg/dl — ABNORMAL HIGH (ref 74–106)
Potassium: 3.7 mEq/L (ref 3.5–5.1)
Potassium: 4 mEq/L (ref 3.5–5.1)
Sodium: 138 mEq/L (ref 136–145)
Sodium: 138 mEq/L (ref 136–145)
Total Bilirubin: 0.6 mg/dl (ref 0.2–1.0)
Total Bilirubin: 0.6 mg/dl (ref 0.2–1.0)
Total Protein: 7.9 gm/dl (ref 6.4–8.2)
Total Protein: 7.2 gm/dl (ref 6.4–8.2)

## 2019-01-10 LAB — CBC WITH AUTO DIFFERENTIAL
Hematocrit: 34.8 % — ABNORMAL LOW (ref 37.0–50.0)
Hematocrit: 32.4 % — ABNORMAL LOW (ref 37.0–50.0)
Hemoglobin: 10.6 gm/dl — ABNORMAL LOW (ref 12.4–17.2)
Hemoglobin: 10.1 gm/dl — ABNORMAL LOW (ref 12.4–17.2)
Lymphocytes %: 15.8 % — ABNORMAL LOW (ref 28–48)
Lymphocytes %: 10.3 % — ABNORMAL LOW (ref 28–48)
MCH: 22.8 pg — ABNORMAL LOW (ref 23.0–34.6)
MCH: 23.2 pg (ref 23.0–34.6)
MCHC: 30.5 gm/dl (ref 30.0–36.0)
MCHC: 31.2 gm/dl (ref 30.0–36.0)
MCV: 74.8 fL — ABNORMAL LOW (ref 80.0–98.0)
MCV: 74.5 fL — ABNORMAL LOW (ref 80.0–98.0)
MPV: 0 fL — ABNORMAL LOW (ref 6.0–10.0)
Monocytes %: 5.1 % (ref 1–13)
Monocytes %: 2.4 % (ref 1–13)
Neutrophils %: 78.5 % — ABNORMAL HIGH (ref 34–64)
Neutrophils %: 86.7 % — ABNORMAL HIGH (ref 34–64)
Platelets: 131 10*3/uL — ABNORMAL LOW (ref 140–450)
Platelets: 128 10*3/uL — ABNORMAL LOW (ref 140–450)
RBC: 4.65 M/uL (ref 3.80–5.70)
RBC: 4.35 M/uL (ref 3.80–5.70)
RDW-SD: 58.6 — ABNORMAL HIGH (ref 35.1–43.9)
WBC: 3.4 10*3/uL — ABNORMAL LOW (ref 4.0–11.0)
WBC: 3.4 10*3/uL — ABNORMAL LOW (ref 4.0–11.0)

## 2019-01-10 LAB — C-REACTIVE PROTEIN: CRP: 137 mg/L — ABNORMAL HIGH (ref 0.0–2.9)

## 2019-01-10 LAB — LACTATE DEHYDROGENASE: LD: 358 U/L — ABNORMAL HIGH (ref 87–241)

## 2019-01-10 LAB — TROPONIN: Troponin I: 0.026 ng/ml (ref 0.000–0.045)

## 2019-01-10 LAB — D-DIMER, QUANTITATIVE: D-Dimer, Quant: 0.63 ug/mL (FEU) — ABNORMAL HIGH (ref 0.01–0.50)

## 2019-01-10 LAB — POCT GLUCOSE: POC Glucose: 126 mg/dL — ABNORMAL HIGH (ref 65–105)

## 2019-01-10 LAB — LEGIONELLA ANTIGEN, URINE: Legionella Antigen, Urine: NEGATIVE

## 2019-01-10 MED ORDER — SODIUM CHLORIDE 0.9 % IV PIGGY BACK
1 gram | INTRAVENOUS | Status: AC
Start: 2019-01-10 — End: 2019-01-14
  Administered 2019-01-10 – 2019-01-14 (×5): via INTRAVENOUS

## 2019-01-10 MED ORDER — ONDANSETRON (PF) 4 MG/2 ML INJECTION
4 mg/2 mL | INTRAMUSCULAR | Status: AC
Start: 2019-01-10 — End: 2019-01-10
  Administered 2019-01-10: 16:00:00 via INTRAVENOUS

## 2019-01-10 MED ORDER — TEMAZEPAM 7.5 MG CAP
7.5 mg | Freq: Every evening | ORAL | Status: DC | PRN
Start: 2019-01-10 — End: 2019-01-20
  Administered 2019-01-11 – 2019-01-20 (×9): via ORAL

## 2019-01-10 MED ORDER — SODIUM CHLORIDE 0.9% BOLUS IV
0.9 % | Freq: Once | INTRAVENOUS | Status: AC
Start: 2019-01-10 — End: 2019-01-10
  Administered 2019-01-10: 16:00:00 via INTRAVENOUS

## 2019-01-10 MED ORDER — REMDESIVIR 100 MG IV INJECTION
Freq: Every evening | INTRAVENOUS | Status: AC
Start: 2019-01-10 — End: 2019-01-14
  Administered 2019-01-12 – 2019-01-15 (×4): via INTRAVENOUS

## 2019-01-10 MED ORDER — AMLODIPINE 5 MG TAB
5 mg | Freq: Every day | ORAL | Status: DC
Start: 2019-01-10 — End: 2019-01-20
  Administered 2019-01-11 – 2019-01-20 (×10): via ORAL

## 2019-01-10 MED ORDER — GABAPENTIN 300 MG CAP
300 mg | Freq: Two times a day (BID) | ORAL | Status: DC
Start: 2019-01-10 — End: 2019-01-20
  Administered 2019-01-11 – 2019-01-20 (×20): via ORAL

## 2019-01-10 MED ORDER — SODIUM CHLORIDE 0.9 % IV
INTRAVENOUS | Status: DC | PRN
Start: 2019-01-10 — End: 2019-01-20

## 2019-01-10 MED ORDER — AZITHROMYCIN 500 MG IV SOLUTION
500 mg | INTRAVENOUS | Status: AC
Start: 2019-01-10 — End: 2019-01-14
  Administered 2019-01-10 – 2019-01-15 (×5): via INTRAVENOUS

## 2019-01-10 MED ORDER — SODIUM CHLORIDE 0.9 % IV
Freq: Once | INTRAVENOUS | Status: AC
Start: 2019-01-10 — End: 2019-01-10
  Administered 2019-01-10: 23:00:00 via INTRAVENOUS

## 2019-01-10 MED ORDER — HYDRALAZINE 20 MG/ML IJ SOLN
20 mg/mL | Freq: Four times a day (QID) | INTRAMUSCULAR | Status: DC | PRN
Start: 2019-01-10 — End: 2019-01-20

## 2019-01-10 MED ORDER — ALLOPURINOL 300 MG TAB
300 mg | Freq: Every day | ORAL | Status: DC
Start: 2019-01-10 — End: 2019-01-20
  Administered 2019-01-11 – 2019-01-20 (×10): via ORAL

## 2019-01-10 MED ORDER — SACCHAROMYCES BOULARDII 250 MG CAP
250 mg | Freq: Two times a day (BID) | ORAL | Status: DC
Start: 2019-01-10 — End: 2019-01-20
  Administered 2019-01-11 – 2019-01-20 (×21): via ORAL

## 2019-01-10 MED ORDER — LIPASE-PROTEASE-AMYLASE 24,000-76,000-120,000 UNIT CAP, DELAY REL
24000-76000 -120,000 unit | Freq: Three times a day (TID) | ORAL | Status: DC
Start: 2019-01-10 — End: 2019-01-20
  Administered 2019-01-12 – 2019-01-20 (×25): via ORAL

## 2019-01-10 MED ORDER — ONDANSETRON (PF) 4 MG/2 ML INJECTION
4 mg/2 mL | INTRAMUSCULAR | Status: AC
Start: 2019-01-10 — End: 2019-01-10
  Administered 2019-01-10: 18:00:00 via INTRAVENOUS

## 2019-01-10 MED ORDER — ENOXAPARIN 40 MG/0.4 ML SUB-Q SYRINGE
40 mg/0.4 mL | SUBCUTANEOUS | Status: DC
Start: 2019-01-10 — End: 2019-01-20
  Administered 2019-01-10 – 2019-01-19 (×10): via SUBCUTANEOUS

## 2019-01-10 MED ORDER — ASPIRIN 81 MG CHEWABLE TAB
81 mg | Freq: Every day | ORAL | Status: DC
Start: 2019-01-10 — End: 2019-01-10

## 2019-01-10 MED ORDER — TAMSULOSIN SR 0.4 MG 24 HR CAP
0.4 mg | Freq: Every day | ORAL | Status: DC
Start: 2019-01-10 — End: 2019-01-20
  Administered 2019-01-11 – 2019-01-20 (×10): via ORAL

## 2019-01-10 MED ORDER — DEXAMETHASONE SODIUM PHOSPHATE 4 MG/ML IJ SOLN
4 mg/mL | INTRAMUSCULAR | Status: DC
Start: 2019-01-10 — End: 2019-01-14
  Administered 2019-01-10 – 2019-01-14 (×5): via INTRAVENOUS

## 2019-01-10 MED ORDER — AMLODIPINE 5 MG TAB
5 mg | Freq: Every day | ORAL | Status: DC
Start: 2019-01-10 — End: 2019-01-10

## 2019-01-10 MED ORDER — METOPROLOL SUCCINATE SR 25 MG 24 HR TAB
25 mg | Freq: Every day | ORAL | Status: DC
Start: 2019-01-10 — End: 2019-01-20
  Administered 2019-01-11 – 2019-01-20 (×10): via ORAL

## 2019-01-10 MED ORDER — NALOXONE 0.4 MG/ML INJECTION
0.4 mg/mL | INTRAMUSCULAR | Status: DC | PRN
Start: 2019-01-10 — End: 2019-01-20

## 2019-01-10 MED ORDER — KETOROLAC TROMETHAMINE 15 MG/ML INJECTION
15 mg/mL | Freq: Three times a day (TID) | INTRAMUSCULAR | Status: AC | PRN
Start: 2019-01-10 — End: 2019-01-15
  Administered 2019-01-10 – 2019-01-12 (×3): via INTRAVENOUS

## 2019-01-10 MED ORDER — KETOROLAC TROMETHAMINE 10 MG TAB
10 mg | Freq: Three times a day (TID) | ORAL | Status: DC | PRN
Start: 2019-01-10 — End: 2019-01-10

## 2019-01-10 MED ORDER — PIPERACILLIN-TAZOBACTAM-DEXTROSE (ISO) 3.375 GRAM/50 ML IV PIGGY BACK
3.375 gram/50 mL | Freq: Three times a day (TID) | INTRAVENOUS | Status: DC
Start: 2019-01-10 — End: 2019-01-10

## 2019-01-10 MED ORDER — POLYVINYL ALCOHOL-POVIDONE 1.4 %-0.6 % EYE DROPPERETTE
Freq: Every day | OPHTHALMIC | Status: DC
Start: 2019-01-10 — End: 2019-01-20
  Administered 2019-01-11 – 2019-01-20 (×10): via OPHTHALMIC

## 2019-01-10 MED ORDER — ONDANSETRON (PF) 4 MG/2 ML INJECTION
4 mg/2 mL | INTRAMUSCULAR | Status: DC | PRN
Start: 2019-01-10 — End: 2019-01-20

## 2019-01-10 MED ORDER — DEXAMETHASONE SODIUM PHOSPHATE 4 MG/ML IJ SOLN
4 mg/mL | INTRAMUSCULAR | Status: AC
Start: 2019-01-10 — End: 2019-01-10
  Administered 2019-01-10: 18:00:00 via INTRAVENOUS

## 2019-01-10 MED ORDER — PIPERACILLIN-TAZOBACTAM 4.5 GRAM IV SOLR
4.5 gram | INTRAVENOUS | Status: AC
Start: 2019-01-10 — End: 2019-01-10
  Administered 2019-01-10: 17:00:00 via INTRAVENOUS

## 2019-01-10 MED FILL — ENOXAPARIN 40 MG/0.4 ML SUB-Q SYRINGE: 40 mg/0.4 mL | SUBCUTANEOUS | Qty: 0.4

## 2019-01-10 MED FILL — FLORASTOR 250 MG CAPSULE: 250 mg | ORAL | Qty: 1

## 2019-01-10 MED FILL — CREON 24,000-76,000-120,000 UNIT CAPSULE,DELAYED RELEASE: 24000-76000 -120,000 unit | ORAL | Qty: 1

## 2019-01-10 MED FILL — ONDANSETRON (PF) 4 MG/2 ML INJECTION: 4 mg/2 mL | INTRAMUSCULAR | Qty: 2

## 2019-01-10 MED FILL — PIPERACILLIN-TAZOBACTAM 4.5 GRAM IV SOLR: 4.5 gram | INTRAVENOUS | Qty: 4.5

## 2019-01-10 MED FILL — AZITHROMYCIN 500 MG IV SOLUTION: 500 mg | INTRAVENOUS | Qty: 5

## 2019-01-10 MED FILL — TEMAZEPAM 7.5 MG CAP: 7.5 mg | ORAL | Qty: 1

## 2019-01-10 MED FILL — DEXAMETHASONE SODIUM PHOSPHATE 4 MG/ML IJ SOLN: 4 mg/mL | INTRAMUSCULAR | Qty: 3

## 2019-01-10 MED FILL — ZOSYN 3.375 GRAM/50 ML IN DEXTROSE (ISO-OSMOTIC) INTRAVENOUS PIGGYBACK: 3.375 gram/50 mL | INTRAVENOUS | Qty: 50

## 2019-01-10 MED FILL — REMDESIVIR 100 MG INTRAVENOUS POWDER FOR SOLUTION: 100 mg | INTRAVENOUS | Qty: 200

## 2019-01-10 MED FILL — CEFTRIAXONE 1 GRAM SOLUTION FOR INJECTION: 1 gram | INTRAMUSCULAR | Qty: 1

## 2019-01-10 MED FILL — KETOROLAC TROMETHAMINE 15 MG/ML INJECTION: 15 mg/mL | INTRAMUSCULAR | Qty: 1

## 2019-01-10 MED FILL — SODIUM CHLORIDE 0.9 % IV: INTRAVENOUS | Qty: 250

## 2019-01-10 MED FILL — DEXAMETHASONE SODIUM PHOSPHATE 4 MG/ML IJ SOLN: 4 mg/mL | INTRAMUSCULAR | Qty: 2

## 2019-01-10 MED FILL — KETOROLAC TROMETHAMINE 10 MG TAB: 10 mg | ORAL | Qty: 1

## 2019-01-10 NOTE — Progress Notes (Signed)
Pt wallet with 24 dollars cash and credit cards locked at bedside, per pt request.

## 2019-01-10 NOTE — Other (Signed)
1:23 PM  01/10/19     Attempted to call report to [place] charge (nurse) who is unable to take report at this time.  Patient to be transported to 5214 (unit) at 1320 (time).  Via stretcher (means of transport) Escorted by [employer] Engineer, civil (consulting) member). On 2LNC     Trellis Moment, RN

## 2019-01-10 NOTE — ED Notes (Signed)
Lab called to add blood work to blood

## 2019-01-10 NOTE — Progress Notes (Addendum)
PAGER ID: [phone]  MESSAGE: [place] can I get a prn for Tylenol please? Thank you, Tommy Brown x [phone]    Pt has allergy to oxycodone/acetaminophen. Will administer Toradol and monitor pt.

## 2019-01-10 NOTE — ED Provider Notes (Signed)
EMERGENCY DEPARTMENT HISTORY AND PHYSICAL EXAM    10:20 AM      Date: 01/10/2019  Patient Name: Tommy Brown    History of Presenting Illness     Chief Complaint   Patient presents with   ??? Flu Like Symptoms   ??? Concern For COVID-19 (Coronavirus)         History Provided By: Patient      Additional History (Context): Tommy Brown is a 73 y.o. male who presents with-like symptoms.  Patient states his symptom onset was last Tuesday where he has had low-grade fevers cough general malaise body aches called 911 this morning because he was feeling worse and did not know what else to do.  He has had some dry heaving decreased appetite denies any abdominal pain denies any flank pain dysuria social history does live at home denies any alcohol or tobacco use.    PCP: Daralene Milch, MD      Current Facility-Administered Medications   Medication Dose Route Frequency Provider Last Rate Last Dose   ??? piperacillin-tazobactam (ZOSYN) 3.375 g IVPB (premix)  3.375 g IntraVENous Q8H Hilbert Corrigan, MD       ??? dexamethasone (DECADRON) 4 mg/mL injection 10 mg  10 mg IntraVENous NOW Hilbert Corrigan, MD       ??? ondansetron (ZOFRAN) injection 4 mg  4 mg IntraVENous NOW Hilbert Corrigan, MD         Current Outpatient Medications   Medication Sig Dispense Refill   ??? testosterone (ANDROGEL) 20.25 mg/1.25 gram (1.62 %) gel Apply 20 mg to affected area daily. Max Daily Amount: 20 mg. 6 Bottle 2   ??? bimatoprost (Lumigan) 0.01 % ophthalmic drops Lumigan 0.01 % eye drops     ??? promethazine (PHENERGAN) 12.5 mg tablet promethazine 12.5 mg tablet   TAKE 1 TABLET BY MOUTH 2 TIMES PER DAY AS NEEDED     ??? testosterone cypionate (DEPOTESTOTERONE CYPIONATE) 200 mg/mL injection INJECT 0.5 MILLILITER INTO THE MUSCLE EVERY 2 WEEKS DISCARD VIAL AFTER USE     ??? metoprolol succinate (TOPROL-XL) 25 mg XL tablet TAKE 1 TABLET BY MOUTH EVERY DAY      ??? ketorolac (TORADOL) 10 mg tablet Take 1 Tab by mouth three (3) times daily as needed for Pain. 10 Tab 0   ??? lipase-protease-amylase (Creon) 24,000-76,000 -120,000 unit capsule Take  by mouth.     ??? lipase-protease-amylase (Creon) 24,000-76,000 -120,000 unit capsule Take  by mouth daily as needed (with snacks and meals).     ??? temazepam (RESTORIL) 7.5 mg capsule Take  by mouth nightly.     ??? tamsulosin (Flomax) 0.4 mg capsule Take 2 Caps by mouth daily. 60 Cap 1   ??? lactobacillus rhamnosus gg 10 billion cell (Culturelle) 10 billion cell capsule Take 1 Cap by mouth Before breakfast and dinner. 40 Cap 0   ??? peg 400-propylene glycol (Systane, propylene glycol,) 0.4-0.3 % drop Administer 2 Drops to both eyes daily. Patient places two drops in both eyes every AM.   Indications: dry eye     ??? metoprolol tartrate (LOPRESSOR) 25 mg tablet Take 25 mg by mouth daily.     ??? gabapentin (NEURONTIN) 600 mg tablet Take 600 mg by mouth two (2) times a day.     ??? amLODIPine (NORVASC) 5 mg tablet Take 5 mg by mouth daily.     ??? allopurinol (ZYLOPRIM) 300 mg tablet Take  by mouth daily.     ??? aspirin  81 mg chewable tablet Take 81 mg by mouth daily.     ??? TESTOSTERONE IM by IntraMUSCular route.         Past History     Past Medical History:  Past Medical History:   Diagnosis Date   ??? Arthritis    ??? Benign prostate hyperplasia    ??? Bladder infection    ??? Chronic kidney disease    ??? Coronary arteriosclerosis    ??? Coronary atherosclerosis of artery bypass graft    ??? Decreased testosterone level    ??? Diverticular disease    ??? Essential hypertension    ??? Gout    ??? History of acute renal failure    ??? History of anemia    ??? History of malignant neoplasm of pancreas    ??? Hx of CABG    ??? Kidney stone    ??? Kidney stones    ??? Neuropathy    ??? Pancreatic cancer ([place])    ??? Sepsis ([place])    ??? Tremor        Past Surgical History:  Past Surgical History:   Procedure Laterality Date   ??? HX CERVICAL FUSION     ??? HX COLONOSCOPY      ??? HX CORONARY ARTERY BYPASS GRAFT  1995   ??? HX HEART CATHETERIZATION  2006   ??? HX ORTHOPAEDIC      back   ??? HX OTHER SURGICAL  2006    whipple procedure done for pancreatic cancer   ??? HX UROLOGICAL  2012    Percutaneous extraction of a kidney stone w/ fragmentation procedure        Family History:  Family History   Problem Relation Age of Onset   ??? Alcohol abuse Father    ??? Heart Disease Brother    ??? Emphysema Brother        Social History:  Social History     Tobacco Use   ??? Smoking status: Never Smoker   ??? Smokeless tobacco: Never Used   Substance Use Topics   ??? Alcohol use: Not Currently   ??? Drug use: Not Currently       Allergies:  Allergies   Allergen Reactions   ??? Other Food Other (comments)     VISTAID  ANESTHESIA   ??? Codeine Itching and Swelling   ??? Oxycodone Anaphylaxis   ??? Hydroxyzine Pamoate Itching and Other (comments)     Other reaction(s): Unknown     ??? Meperidine Itching, Palpitations, Swelling and Other (comments)     Other reaction(s): Unknown     ??? Metoclopramide Itching, Other (comments) and Swelling     Other reaction(s): Other (See Comments)  Reaction unknown   Reaction unknown      ??? Morphine Other (comments) and Nausea and Vomiting   ??? Oxycodone-Acetaminophen Itching   ??? Prochlorperazine Itching, Other (comments) and Unknown (comments)     Other reaction(s): Unknown  Unsure of reaction     ??? Hydroxyzine Hcl Hives         Review of Systems       Review of Systems   Constitutional: Positive for appetite change, diaphoresis, fatigue and fever. Negative for chills.   HENT: Positive for congestion. Negative for rhinorrhea.    Eyes: Negative for visual disturbance.   Respiratory: Positive for cough and chest tightness. Negative for shortness of breath and wheezing.    Cardiovascular: Negative for chest pain.   Gastrointestinal: Positive for nausea and vomiting. Negative for abdominal  pain.   Endocrine: Negative for polyphagia and polyuria.   Genitourinary: Negative for dysuria and flank pain.    Musculoskeletal: Negative for back pain.   Skin: Negative for rash.   Neurological: Positive for light-headedness. Negative for dizziness, syncope and weakness.   All other systems reviewed and are negative.        Physical Exam     Visit Vitals  BP (!) 155/72   Pulse 84   Temp 100 ??F (37.8 ??C)   Resp 25   Wt 61.2 kg (135 lb)   SpO2 92%   BMI 20.53 kg/m??       Physical Exam  Vitals signs and nursing note reviewed.   Constitutional:       General: He is not in acute distress.     Appearance: He is well-developed. He is not toxic-appearing or diaphoretic.   HENT:      Head: Normocephalic and atraumatic.      Mouth/Throat:      Mouth: Mucous membranes are dry.   Eyes:      General: No scleral icterus.     Conjunctiva/sclera: Conjunctivae normal.      Pupils: Pupils are equal, round, and reactive to light.   Neck:      Musculoskeletal: Normal range of motion and neck supple.   Cardiovascular:      Rate and Rhythm: Normal rate and regular rhythm.      Heart sounds: Murmur (4/6 systolic murmur) present.      Comments: Median sternotomy scar  Pulmonary:      Effort: Pulmonary effort is normal. No respiratory distress.      Breath sounds: Normal breath sounds.      Comments: Minimal rhonchi no respiratory distress no accessory muscle use  Abdominal:      General: Bowel sounds are normal. There is no distension.      Palpations: Abdomen is soft.      Tenderness: There is no abdominal tenderness.   Lymphadenopathy:      Cervical: No cervical adenopathy.   Skin:     General: Skin is warm and dry.      Findings: No rash.   Neurological:      Mental Status: He is alert and oriented to person, place, and time.      Coordination: Coordination normal.   Psychiatric:         Behavior: Behavior normal.           Diagnostic Study Results     Labs -  Recent Results (from the past 12 hour(s))   CBC WITH AUTOMATED DIFF    Collection Time: 01/10/19 10:20 AM   Result Value Ref Range    WBC 3.4 (L) 4.0 - 11.0 1000/mm3     RBC 4.65 3.80 - 5.70 M/uL    HGB 10.6 (L) 12.4 - 17.2 gm/dl    HCT 34.8 (L) 37.0 - 50.0 %    MCV 74.8 (L) 80.0 - 98.0 fL    MCH 22.8 (L) 23.0 - 34.6 pg    MCHC 30.5 30.0 - 36.0 gm/dl    PLATELET 131 (L) 140 - 450 1000/mm3    MPV 0.0 (L) 6.0 - 10.0 fL    NEUTROPHILS 78.5 (H) 34 - 64 %    LYMPHOCYTES 15.8 (L) 28 - 48 %    MONOCYTES 5.1 1 - 13 %   METABOLIC PANEL, COMPREHENSIVE    Collection Time: 01/10/19 10:20 AM   Result Value Ref Range  Sodium 138 136 - 145 mEq/L    Potassium 3.7 3.5 - 5.1 mEq/L    Chloride 106 98 - 107 mEq/L    CO2 24 21 - 32 mEq/L    Glucose 116 (H) 74 - 106 mg/dl    BUN 11 7 - 25 mg/dl    Creatinine 1.3 0.6 - 1.3 mg/dl    GFR est AA >60      GFR est non-AA 57      Calcium 8.4 (L) 8.5 - 10.1 mg/dl    AST (SGOT) 58 (H) 15 - 37 U/L    ALT (SGPT) 28 12 - 78 U/L    Alk. phosphatase 135 (H) 45 - 117 U/L    Bilirubin, total 0.6 0.2 - 1.0 mg/dl    Protein, total 7.9 6.4 - 8.2 gm/dl    Albumin 3.0 (L) 3.4 - 5.0 gm/dl    Anion gap 8 5 - 15 mmol/L   PROCALCITONIN    Collection Time: 01/10/19 10:20 AM   Result Value Ref Range    PROCALCITONIN 0.08 0.00 - 0.50 ng/ml   CK    Collection Time: 01/10/19 10:20 AM   Result Value Ref Range    CK 68 39 - 308 U/L   C REACTIVE PROTEIN, QT    Collection Time: 01/10/19 10:20 AM   Result Value Ref Range    C-Reactive protein 137.0 (H) 0.0 - 2.9 mg/L   FERRITIN    Collection Time: 01/10/19 10:20 AM   Result Value Ref Range    Ferritin 137.3 26.0 - 388.0 ng/ml   LD    Collection Time: 01/10/19 10:20 AM   Result Value Ref Range    LD 358 (H) 87 - 241 U/L   FIBRINOGEN    Collection Time: 01/10/19 10:20 AM   Result Value Ref Range    Fibrinogen 420 (H) 220 - 397 mg/dl   D DIMER    Collection Time: 01/10/19 10:20 AM   Result Value Ref Range    D DIMER 0.63 (H) 0.01 - 0.50 ug/mL (FEU)   TROPONIN I    Collection Time: 01/10/19 10:20 AM   Result Value Ref Range    Troponin-I 0.026 0.000 - 0.045 ng/ml   LACTIC ACID    Collection Time: 01/10/19 10:30 AM    Result Value Ref Range    Lactic Acid 2.0 0.4 - 2.0 mmol/L   [id number]-[id number]    Collection Time: 01/10/19 10:50 AM    Specimen: NASOPHARYNGEAL SWAB   Result Value Ref Range    COVID-19 POSITIVE (A) NEGATIVE         Radiologic Studies -   XR CHEST SNGL V   Final Result   IMPRESSION:    Multiple patchy bilateral airspace opacities, concerning for multifocal   pneumonia.               Medical Decision Making   I am the first provider for this patient.    I reviewed the vital signs, available nursing notes, past medical history, past surgical history, family history and social history.    Vital Signs-Reviewed the patient's vital signs.      EKG: Sinus rhythm at a rate of 87 with no acute ST-T wave changes poor R wave progression QTC is 440.  Q waves interpreted by me at 10:15 AM    Records Reviewed: Nursing Notes and Old Medical Records (Time of Review: 10:20 AM)    ED Course: Progress Notes, Reevaluation, and Consults:  Provider Notes (Medical Decision Making):   MDM  Number of Diagnoses or Management Options  Multifocal pneumonia:   Diagnosis management comments: Concern for possible Covid, viral syndrome, pneumonia will check influenza gentle hydration sepsis work-up    cxr concerning for covid will send labs abx for possible bacterial pneumonia      12:47 PM  Patient is stable in the emergency department Decadron was started antibiotics were started although I believe this is more related to a Covid pneumonia discussed with Dr. Elisabeth Most he agrees with admission orders for admission to telemetry       Amount and/or Complexity of Data Reviewed  Clinical lab tests: ordered and reviewed  Tests in the radiology section of CPT??: ordered and reviewed    Risk of Complications, Morbidity, and/or Mortality  Presenting problems: high  Diagnostic procedures: moderate  Management options: moderate            Critical Care Time:       Diagnosis     Clinical Impression:   1. Multifocal pneumonia        Disposition: Admit     Follow-up Information    None          Patient's Medications   Start Taking    No medications on file   Continue Taking    ALLOPURINOL (ZYLOPRIM) 300 MG TABLET    Take  by mouth daily.    AMLODIPINE (NORVASC) 5 MG TABLET    Take 5 mg by mouth daily.    ASPIRIN 81 MG CHEWABLE TABLET    Take 81 mg by mouth daily.    BIMATOPROST (LUMIGAN) 0.01 % OPHTHALMIC DROPS    Lumigan 0.01 % eye drops    GABAPENTIN (NEURONTIN) 600 MG TABLET    Take 600 mg by mouth two (2) times a day.    KETOROLAC (TORADOL) 10 MG TABLET    Take 1 Tab by mouth three (3) times daily as needed for Pain.    LACTOBACILLUS RHAMNOSUS GG 10 BILLION CELL (CULTURELLE) 10 BILLION CELL CAPSULE    Take 1 Cap by mouth Before breakfast and dinner.    LIPASE-PROTEASE-AMYLASE (CREON) 24,000-76,000 -120,000 UNIT CAPSULE    Take  by mouth.    LIPASE-PROTEASE-AMYLASE (CREON) 24,000-76,000 -120,000 UNIT CAPSULE    Take  by mouth daily as needed (with snacks and meals).    METOPROLOL SUCCINATE (TOPROL-XL) 25 MG XL TABLET    TAKE 1 TABLET BY MOUTH EVERY DAY    METOPROLOL TARTRATE (LOPRESSOR) 25 MG TABLET    Take 25 mg by mouth daily.    PEG 400-PROPYLENE GLYCOL (SYSTANE, PROPYLENE GLYCOL,) 0.4-0.3 % DROP    Administer 2 Drops to both eyes daily. Patient places two drops in both eyes every AM.   Indications: dry eye    PROMETHAZINE (PHENERGAN) 12.5 MG TABLET    promethazine 12.5 mg tablet   TAKE 1 TABLET BY MOUTH 2 TIMES PER DAY AS NEEDED    TAMSULOSIN (FLOMAX) 0.4 MG CAPSULE    Take 2 Caps by mouth daily.    TEMAZEPAM (RESTORIL) 7.5 MG CAPSULE    Take  by mouth nightly.    TESTOSTERONE (ANDROGEL) 20.25 MG/1.25 GRAM (1.62 %) GEL    Apply 20 mg to affected area daily. Max Daily Amount: 20 mg.    TESTOSTERONE CYPIONATE (DEPOTESTOTERONE CYPIONATE) 200 MG/ML INJECTION    INJECT 0.5 MILLILITER INTO THE MUSCLE EVERY 2 WEEKS DISCARD VIAL AFTER USE    TESTOSTERONE IM    by IntraMUSCular route.  These Medications have changed    No medications on file   Stop Taking     No medications on file     _______________________________    Please note that this dictation was completed with Dragon, the computer voice recognition software.  Quite often unanticipated grammatical, syntax, homophones, and other interpretive errors are inadvertently transcribed by the computer software.  Please disregard these errors.  Please excuse any errors that have escaped final proofreading.

## 2019-01-10 NOTE — Other (Signed)
Bedside and Verbal shift change report given to Kelly Everest RN (oncoming nurse) by Kasandra RN (offgoing nurse). Report included the following information SBAR and Kardex.

## 2019-01-10 NOTE — Progress Notes (Addendum)
PAGER ID: [phone]  MESSAGE: [place] pt is not tolerating PO at this time, is it possible to get the Toradol as IV? Thanks, Kasandra RN x [phone]    Order changed to IV.

## 2019-01-10 NOTE — H&P (Signed)
History and Physical      NAME:  Tommy Brown   DOB:   Dec 03, 1945   MRN:   [id number]     Date/Time:  01/10/2019     Chief Complaint: Fever      History of Present Illness:        [name] is a 73 y.o.   male with a PMH of hypertension, CAD status post CABG, gout, pancreatic CA, neuropathy who presents with c/c of fever.  Patient has fever and chills since last Tuesday.  Patient denies chest pain but he has shortness of breathing.  Patient denies nausea vomiting or abdominal pain.  Patient was tested for COVID-19 in the emergency room and came back positive.      Past Medical History:   Diagnosis Date   ??? Arthritis    ??? Benign prostate hyperplasia    ??? Bladder infection    ??? Chronic kidney disease    ??? Coronary arteriosclerosis    ??? Coronary atherosclerosis of artery bypass graft    ??? Decreased testosterone level    ??? Diverticular disease    ??? Essential hypertension    ??? Gout    ??? History of acute renal failure    ??? History of anemia    ??? History of malignant neoplasm of pancreas    ??? Hx of CABG    ??? Kidney stone    ??? Kidney stones    ??? Neuropathy    ??? Pancreatic cancer ([place])    ??? Sepsis ([place])    ??? Tremor         Past Surgical History:   Procedure Laterality Date   ??? HX CERVICAL FUSION     ??? HX COLONOSCOPY     ??? HX CORONARY ARTERY BYPASS GRAFT  1995   ??? HX HEART CATHETERIZATION  2006   ??? HX ORTHOPAEDIC      back   ??? HX OTHER SURGICAL  2006    whipple procedure done for pancreatic cancer   ??? HX UROLOGICAL  2012    Percutaneous extraction of a kidney stone w/ fragmentation procedure        Social History     Tobacco Use   ??? Smoking status: Never Smoker   ??? Smokeless tobacco: Never Used   Substance Use Topics   ??? Alcohol use: Not Currently        Family History   Problem Relation Age of Onset   ??? Alcohol abuse Father    ??? Heart Disease Brother    ??? Emphysema Brother         Allergies   Allergen Reactions   ??? Other Food Other (comments)     VISTAID  ANESTHESIA    ??? Codeine Itching and Swelling   ??? Oxycodone Anaphylaxis   ??? Hydroxyzine Pamoate Itching and Other (comments)     Other reaction(s): Unknown     ??? Meperidine Itching, Palpitations, Swelling and Other (comments)     Other reaction(s): Unknown     ??? Metoclopramide Itching, Other (comments) and Swelling     Other reaction(s): Other (See Comments)  Reaction unknown   Reaction unknown      ??? Morphine Other (comments) and Nausea and Vomiting   ??? Oxycodone-Acetaminophen Itching   ??? Prochlorperazine Itching, Other (comments) and Unknown (comments)     Other reaction(s): Unknown  Unsure of reaction     ??? Hydroxyzine Hcl Hives        Prior to  Admission medications    Medication Sig Start Date End Date Taking? Authorizing Provider   metoprolol succinate (TOPROL-XL) 25 mg XL tablet TAKE 1 TABLET BY MOUTH EVERY DAY 07/21/18  Yes Provider, Historical   tamsulosin (Flomax) 0.4 mg capsule Take 2 Caps by mouth daily. 07/17/18  Yes Kristen Loader, MD   gabapentin (NEURONTIN) 600 mg tablet Take 600 mg by mouth two (2) times a day.   Yes Other, Phys, MD   amLODIPine (NORVASC) 5 mg tablet Take 5 mg by mouth daily.   Yes Other, Phys, MD   allopurinol (ZYLOPRIM) 300 mg tablet Take  by mouth daily.   Yes Other, Phys, MD   aspirin 81 mg chewable tablet Take 81 mg by mouth daily.   Yes Other, Phys, MD   testosterone (ANDROGEL) 20.25 mg/1.25 gram (1.62 %) gel Apply 20 mg to affected area daily. Max Daily Amount: 20 mg. 12/21/18   Other, Phys, MD   bimatoprost (Lumigan) 0.01 % ophthalmic drops Lumigan 0.01 % eye drops    Provider, Historical   promethazine (PHENERGAN) 12.5 mg tablet promethazine 12.5 mg tablet   TAKE 1 TABLET BY MOUTH 2 TIMES PER DAY AS NEEDED    Provider, Historical   testosterone cypionate (DEPOTESTOTERONE CYPIONATE) 200 mg/mL injection INJECT 0.5 MILLILITER INTO THE MUSCLE EVERY 2 WEEKS DISCARD VIAL AFTER USE 11/16/18   Provider, Historical    ketorolac (TORADOL) 10 mg tablet Take 1 Tab by mouth three (3) times daily as needed for Pain. 08/06/18   Retia Passe, MD   lipase-protease-amylase (Creon) 24,000-76,000 -120,000 unit capsule Take  by mouth.    Provider, Historical   lipase-protease-amylase (Creon) 24,000-76,000 -120,000 unit capsule Take  by mouth daily as needed (with snacks and meals).    Provider, Historical   temazepam (RESTORIL) 7.5 mg capsule Take  by mouth nightly.    Provider, Historical   lactobacillus rhamnosus gg 10 billion cell (Culturelle) 10 billion cell capsule Take 1 Cap by mouth Before breakfast and dinner. 07/17/18   Kristen Loader, MD   peg 400-propylene glycol (Systane, propylene glycol,) 0.4-0.3 % drop Administer 2 Drops to both eyes daily. Patient places two drops in both eyes every AM.   Indications: dry eye    Provider, Historical   metoprolol tartrate (LOPRESSOR) 25 mg tablet Take 25 mg by mouth daily.    Other, Phys, MD   TESTOSTERONE IM by IntraMUSCular route.    Other, Phys, MD          Review of systems:     CONSTITUTIONAL: No weight loss, No Night sweats  HEENT:  No epistaxis, No diff in swallowing  CVS: No chest pain, No palpitations, No syncope, No peripheral edema, No PND, No orthopnea  RS: No shortness of breath, No cough, No hemoptysis, No pleuritic chest pain  GI: No abd pain, No vomitting, No diarrhea, No hematemesis, No rectal bleeding, No acid reflux or heartburn  NEURO: No focal weakness, No headaches, No seizures  PSYCH: No anxiety, No depression  MUSCULOSKLETAL: No joint pain or swelling  GU: No hematuria or dysuria  SKIN: No rash      Physical Exam:       VITALS:    Vital signs reviewed; most recent are:    Visit Vitals  BP (!) 170/73 (BP Patient Position: Supine)   Pulse 79   Temp (!) 100.8 ??F (38.2 ??C)   Resp 23   Wt 61.2 kg (135 lb)   SpO2 95%   BMI  20.53 kg/m??     SpO2 Readings from Last 6 Encounters:   01/10/19 95%   07/31/18 98%   07/17/18 99%   06/26/18 97%   01/21/18 96%     O2 Flow Rate (L/min): 2 l/min       Intake/Output Summary (Last 24 hours) at 01/10/2019 1548  Last data filed at 01/10/2019 1317  Gross per 24 hour   Intake 100 ml   Output 500 ml   Net -400 ml        GENERAL: Not in acute distress  HEENT: pink conjunctiva, un icteric sclera,   NECK: No lymphadenopthy or thyroid swelling, JVD not seen  LYMPH: No supraclavicular or cervical or axillary nodes on both sides  CVS: S1S2, No murmurs, No gallop or rub  RS: CTA, No wheezing or crackles  Abd: Soft, non tender, not distended, No guarding, No rigidity  NEURO:  No focal neurologic deficits   Extrm: no leg edema or swelling   Skin: No rash      Labs:     Recent Results (from the past 24 hour(s))   CBC WITH AUTOMATED DIFF    Collection Time: 01/10/19 10:20 AM   Result Value Ref Range    WBC 3.4 (L) 4.0 - 11.0 1000/mm3    RBC 4.65 3.80 - 5.70 M/uL    HGB 10.6 (L) 12.4 - 17.2 gm/dl    HCT 34.8 (L) 37.0 - 50.0 %    MCV 74.8 (L) 80.0 - 98.0 fL    MCH 22.8 (L) 23.0 - 34.6 pg    MCHC 30.5 30.0 - 36.0 gm/dl    PLATELET 131 (L) 140 - 450 1000/mm3    MPV 0.0 (L) 6.0 - 10.0 fL    NEUTROPHILS 78.5 (H) 34 - 64 %    LYMPHOCYTES 15.8 (L) 28 - 48 %    MONOCYTES 5.1 1 - 13 %   METABOLIC PANEL, COMPREHENSIVE    Collection Time: 01/10/19 10:20 AM   Result Value Ref Range    Sodium 138 136 - 145 mEq/L    Potassium 3.7 3.5 - 5.1 mEq/L    Chloride 106 98 - 107 mEq/L    CO2 24 21 - 32 mEq/L    Glucose 116 (H) 74 - 106 mg/dl    BUN 11 7 - 25 mg/dl    Creatinine 1.3 0.6 - 1.3 mg/dl    GFR est AA >60      GFR est non-AA 57      Calcium 8.4 (L) 8.5 - 10.1 mg/dl    AST (SGOT) 58 (H) 15 - 37 U/L    ALT (SGPT) 28 12 - 78 U/L    Alk. phosphatase 135 (H) 45 - 117 U/L    Bilirubin, total 0.6 0.2 - 1.0 mg/dl    Protein, total 7.9 6.4 - 8.2 gm/dl    Albumin 3.0 (L) 3.4 - 5.0 gm/dl    Anion gap 8 5 - 15 mmol/L   PROCALCITONIN    Collection Time: 01/10/19 10:20 AM   Result Value Ref Range    PROCALCITONIN 0.08 0.00 - 0.50 ng/ml   CK     Collection Time: 01/10/19 10:20 AM   Result Value Ref Range    CK 68 39 - 308 U/L   C REACTIVE PROTEIN, QT    Collection Time: 01/10/19 10:20 AM   Result Value Ref Range    C-Reactive protein 137.0 (H) 0.0 - 2.9 mg/L   FERRITIN    Collection  Time: 01/10/19 10:20 AM   Result Value Ref Range    Ferritin 137.3 26.0 - 388.0 ng/ml   LD    Collection Time: 01/10/19 10:20 AM   Result Value Ref Range    LD 358 (H) 87 - 241 U/L   FIBRINOGEN    Collection Time: 01/10/19 10:20 AM   Result Value Ref Range    Fibrinogen 420 (H) 220 - 397 mg/dl   D DIMER    Collection Time: 01/10/19 10:20 AM   Result Value Ref Range    D DIMER 0.63 (H) 0.01 - 0.50 ug/mL (FEU)   TROPONIN I    Collection Time: 01/10/19 10:20 AM   Result Value Ref Range    Troponin-I 0.026 0.000 - 0.045 ng/ml   LACTIC ACID    Collection Time: 01/10/19 10:30 AM   Result Value Ref Range    Lactic Acid 2.0 0.4 - 2.0 mmol/L   SARS-COV-2_FLU    Collection Time: 01/10/19 10:50 AM    Specimen: NASOPHARYNGEAL SWAB   Result Value Ref Range    COVID-19 POSITIVE (A) NEGATIVE     BLOOD TYPE, (ABO+RH)    Collection Time: 01/10/19 11:30 AM   Result Value Ref Range    ABO/Rh(D) A Rh Negative     LEGIONELLA PNEUMOPHILA AG, URINE    Collection Time: 01/10/19  1:05 PM    Specimen: Urine   Result Value Ref Range    Legionella Ag, urine NEGATIVE NEGATIVE     S.PNEUMO AG, UR/CSF    Collection Time: 01/10/19  1:05 PM   Result Value Ref Range    Strep pneumo Ag, urine POSITIVE (A) NEGATIVE     ANTIBODY SCREEN    Collection Time: 01/10/19  1:13 PM   Result Value Ref Range    Antibody screen NEG     POC URINE MACROSCOPIC    Collection Time: 01/10/19  1:21 PM   Result Value Ref Range    Glucose Negative NEGATIVE,Negative mg/dl    Bilirubin Small (A) NEGATIVE,Negative      Ketone Trace (A) NEGATIVE,Negative mg/dl    Specific gravity 1.025 1.005 - 1.030      Blood Moderate (A) NEGATIVE,Negative      pH (UA) 5.5 5 - 9      Protein 100 (A) NEGATIVE,Negative mg/dl     Urobilinogen 0.2 0.0 - 1.0 EU/dl    Nitrites Negative NEGATIVE,Negative      Leukocyte Esterase Negative NEGATIVE,Negative      Color Dark yellow      Appearance Cloudy     GLUCOSE, POC    Collection Time: 01/10/19  2:42 PM   Result Value Ref Range    Glucose (POC) 126 (H) 65 - 105 mg/dL         Active Problems:    Pneumonia due to COVID-19 virus (01/10/2019)        Assessment:       1. COVID-19 infection  2. Multifocal pneumonia  3. Pancytopenia  4. Hypertension  5. CAD status post CABG  6. Gout  7. Pancreatic cancer  8. Neuropathy    Plan:       ?? Patient admitted to medical service  ?? Continue supplemental oxygen  ?? Discussed therapeutic for COVID-19 with the patient including convalescent plasma transfusion, dexamethasone and remdesivir.  Patient consented and willing to start alternate treatment.  ?? Start him on IV antibiotics with ceftriaxone and Zithromax  ?? Resume home medication  ?? DVT prophylaxis    Advanced  care planning: Discussed advanced care planning with his wife.  Discussed his diagnosis for COVID-19 and the therapeutic plan.  Both patient and his wife consented for convalescent plasma, remdesivir and dexamethasone.  Patient will remain full CODE STATUS.  His wife can be reached at [phone].  Time spent for advanced care planning 18 minutes    Total time:  76 minutes        _______________________________________________________________________        Attending Physician: Stanton Kidney, MD       Copies: Daralene Milch, MD    Dragon medical dictation software was used for portions of this report. Unintended voice recognition errors may occur.

## 2019-01-10 NOTE — Progress Notes (Signed)
Pt did not want to eat or drink anything, stated he was too nauseous. Also refused Zofran. Stated it made him sicker down in the ER. Pt stated sleep is "all he needs".

## 2019-01-10 NOTE — Other (Signed)
TRANSFER - OUT REPORT:    Verbal report given to Crystal RN(name) on Tommy Brown  being transferred to [facility](unit) for routine progression of care       Report consisted of patient???s Situation, Background, Assessment and   Recommendations(SBAR).     Information from the following report(s) SBAR, ED Summary and MAR was reviewed with the receiving nurse.    Lines:   Peripheral IV 01/10/19 Right Antecubital (Active)   Site Assessment Clean, dry, & intact 01/10/19 1020   Phlebitis Assessment 0 01/10/19 1020   Infiltration Assessment 0 01/10/19 1020   Dressing Status Clean, dry, & intact 01/10/19 1020   Dressing Type Transparent 01/10/19 1020   Hub Color/Line Status Flushed 01/10/19 1020       Peripheral IV 01/10/19 Left Antecubital (Active)   Site Assessment Clean, dry, & intact 01/10/19 1140   Phlebitis Assessment 0 01/10/19 1140   Infiltration Assessment 0 01/10/19 1140   Dressing Status Clean, dry, & intact 01/10/19 1140   Dressing Type Transparent 01/10/19 1140   Hub Color/Line Status Flushed 01/10/19 1140        Opportunity for questions and clarification was provided.      Patient transported with:   [employer]

## 2019-01-10 NOTE — ED Notes (Signed)
Pt assisted up in bed and has no sx's of distress noted at this time

## 2019-01-10 NOTE — Progress Notes (Signed)
Pt had trigeminy per tele tech. Pt asymptomatic. Will continue to monitor.

## 2019-01-10 NOTE — ED Notes (Signed)
Pt arrived from home via ems c/o flu-like sx's x Tuesday w/ chills/fever, dry-heaves, body aches and nausea upon arrival to ED Room 7.    Per ems, ekg done and nsr w/ v/s's WNL

## 2019-01-10 NOTE — Progress Notes (Signed)
PAGER ID: [phone]  MESSAGE: Tommy Brown new admit from ED arrived, has temperature and c/o nausea. Blood pressure elevated. Please advise. Thank you, Kasandra x [phone]

## 2019-01-10 NOTE — H&P (Signed)
History and Physical      NAME:  Tommy Brown   DOB:   08-05-1945   MRN:   [id number]     Date/Time:  01/10/2019     Chief Complaint: Fever      History of Present Illness:        Tommy Brown is a 73 y.o.   male with a PMH of hypertension, CAD status post CABG, gout, pancreatic CA, neuropathy who presents with c/c of fever.  Patient has fever and chills since last Tuesday.  Patient denies chest pain but he has shortness of breathing.  Patient denies nausea vomiting or abdominal pain.  Patient was tested for COVID-19 in the emergency room and came back positive.      Past Medical History:   Diagnosis Date   ??? Arthritis    ??? Benign prostate hyperplasia    ??? Bladder infection    ??? Chronic kidney disease    ??? Coronary arteriosclerosis    ??? Coronary atherosclerosis of artery bypass graft    ??? Decreased testosterone level    ??? Diverticular disease    ??? Essential hypertension    ??? Gout    ??? History of acute renal failure    ??? History of anemia    ??? History of malignant neoplasm of pancreas    ??? Hx of CABG    ??? Kidney stone    ??? Kidney stones    ??? Neuropathy    ??? Pancreatic cancer ([place])    ??? Sepsis ([place])    ??? Tremor         Past Surgical History:   Procedure Laterality Date   ??? HX CERVICAL FUSION     ??? HX COLONOSCOPY     ??? HX CORONARY ARTERY BYPASS GRAFT  1995   ??? HX HEART CATHETERIZATION  2006   ??? HX ORTHOPAEDIC      back   ??? HX OTHER SURGICAL  2006    whipple procedure done for pancreatic cancer   ??? HX UROLOGICAL  2012    Percutaneous extraction of a kidney stone w/ fragmentation procedure        Social History     Tobacco Use   ??? Smoking status: Never Smoker   ??? Smokeless tobacco: Never Used   Substance Use Topics   ??? Alcohol use: Not Currently        Family History   Problem Relation Age of Onset   ??? Alcohol abuse Father    ??? Heart Disease Brother    ??? Emphysema Brother         Allergies   Allergen Reactions   ??? Other Food Other (comments)     VISTAID  ANESTHESIA   ???  Codeine Itching and Swelling   ??? Oxycodone Anaphylaxis   ??? Hydroxyzine Pamoate Itching and Other (comments)     Other reaction(s): Unknown     ??? Meperidine Itching, Palpitations, Swelling and Other (comments)     Other reaction(s): Unknown     ??? Metoclopramide Itching, Other (comments) and Swelling     Other reaction(s): Other (See Comments)  Reaction unknown   Reaction unknown      ??? Morphine Other (comments) and Nausea and Vomiting   ??? Oxycodone-Acetaminophen Itching   ??? Prochlorperazine Itching, Other (comments) and Unknown (comments)     Other reaction(s): Unknown  Unsure of reaction     ??? Hydroxyzine Hcl Hives        Prior to  Admission medications    Medication Sig Start Date End Date Taking? Authorizing Provider   metoprolol succinate (TOPROL-XL) 25 mg XL tablet TAKE 1 TABLET BY MOUTH EVERY DAY 07/21/18  Yes Provider, Historical   tamsulosin (Flomax) 0.4 mg capsule Take 2 Caps by mouth daily. 07/17/18  Yes Kristen Loader, MD   gabapentin (NEURONTIN) 600 mg tablet Take 600 mg by mouth two (2) times a day.   Yes Other, Phys, MD   amLODIPine (NORVASC) 5 mg tablet Take 5 mg by mouth daily.   Yes Other, Phys, MD   allopurinol (ZYLOPRIM) 300 mg tablet Take  by mouth daily.   Yes Other, Phys, MD   aspirin 81 mg chewable tablet Take 81 mg by mouth daily.   Yes Other, Phys, MD   testosterone (ANDROGEL) 20.25 mg/1.25 gram (1.62 %) gel Apply 20 mg to affected area daily. Max Daily Amount: 20 mg. 12/21/18   Other, Phys, MD   bimatoprost (Lumigan) 0.01 % ophthalmic drops Lumigan 0.01 % eye drops    Provider, Historical   promethazine (PHENERGAN) 12.5 mg tablet promethazine 12.5 mg tablet   TAKE 1 TABLET BY MOUTH 2 TIMES PER DAY AS NEEDED    Provider, Historical   testosterone cypionate (DEPOTESTOTERONE CYPIONATE) 200 mg/mL injection INJECT 0.5 MILLILITER INTO THE MUSCLE EVERY 2 WEEKS DISCARD VIAL AFTER USE 11/16/18   Provider, Historical   ketorolac (TORADOL) 10 mg tablet Take 1 Tab by mouth three (3) times daily as needed for  Pain. 08/06/18   Retia Passe, MD   lipase-protease-amylase (Creon) 24,000-76,000 -120,000 unit capsule Take  by mouth.    Provider, Historical   lipase-protease-amylase (Creon) 24,000-76,000 -120,000 unit capsule Take  by mouth daily as needed (with snacks and meals).    Provider, Historical   temazepam (RESTORIL) 7.5 mg capsule Take  by mouth nightly.    Provider, Historical   lactobacillus rhamnosus gg 10 billion cell (Culturelle) 10 billion cell capsule Take 1 Cap by mouth Before breakfast and dinner. 07/17/18   Kristen Loader, MD   peg 400-propylene glycol (Systane, propylene glycol,) 0.4-0.3 % drop Administer 2 Drops to both eyes daily. Patient places two drops in both eyes every AM.   Indications: dry eye    Provider, Historical   metoprolol tartrate (LOPRESSOR) 25 mg tablet Take 25 mg by mouth daily.    Other, Phys, MD   TESTOSTERONE IM by IntraMUSCular route.    Other, Phys, MD          Review of systems:     CONSTITUTIONAL: No weight loss, No Night sweats  HEENT:  No epistaxis, No diff in swallowing  CVS: No chest pain, No palpitations, No syncope, No peripheral edema, No PND, No orthopnea  RS: No shortness of breath, No cough, No hemoptysis, No pleuritic chest pain  GI: No abd pain, No vomitting, No diarrhea, No hematemesis, No rectal bleeding, No acid reflux or heartburn  NEURO: No focal weakness, No headaches, No seizures  PSYCH: No anxiety, No depression  MUSCULOSKLETAL: No joint pain or swelling  GU: No hematuria or dysuria  SKIN: No rash      Physical Exam:       VITALS:    Vital signs reviewed; most recent are:    Visit Vitals  BP (!) 170/73 (BP Patient Position: Supine)   Pulse 79   Temp (!) 100.8 ??F (38.2 ??C)   Resp 23   Wt 61.2 kg (135 lb)   SpO2 95%   BMI  20.53 kg/m??     SpO2 Readings from Last 6 Encounters:   01/10/19 95%   07/31/18 98%   07/17/18 99%   06/26/18 97%   01/21/18 96%    O2 Flow Rate (L/min): 2 l/min       Intake/Output Summary (Last 24 hours) at 01/10/2019 1548  Last data filed  at 01/10/2019 1317  Gross per 24 hour   Intake 100 ml   Output 500 ml   Net -400 ml        GENERAL: Not in acute distress  HEENT: pink conjunctiva, un icteric sclera,   NECK: No lymphadenopthy or thyroid swelling, JVD not seen  LYMPH: No supraclavicular or cervical or axillary nodes on both sides  CVS: S1S2, No murmurs, No gallop or rub  RS: CTA, No wheezing or crackles  Abd: Soft, non tender, not distended, No guarding, No rigidity  NEURO:  No focal neurologic deficits   Extrm: no leg edema or swelling   Skin: No rash      Labs:     Recent Results (from the past 24 hour(s))   CBC WITH AUTOMATED DIFF    Collection Time: 01/10/19 10:20 AM   Result Value Ref Range    WBC 3.4 (L) 4.0 - 11.0 1000/mm3    RBC 4.65 3.80 - 5.70 M/uL    HGB 10.6 (L) 12.4 - 17.2 gm/dl    HCT 34.8 (L) 37.0 - 50.0 %    MCV 74.8 (L) 80.0 - 98.0 fL    MCH 22.8 (L) 23.0 - 34.6 pg    MCHC 30.5 30.0 - 36.0 gm/dl    PLATELET 131 (L) 140 - 450 1000/mm3    MPV 0.0 (L) 6.0 - 10.0 fL    NEUTROPHILS 78.5 (H) 34 - 64 %    LYMPHOCYTES 15.8 (L) 28 - 48 %    MONOCYTES 5.1 1 - 13 %   METABOLIC PANEL, COMPREHENSIVE    Collection Time: 01/10/19 10:20 AM   Result Value Ref Range    Sodium 138 136 - 145 mEq/L    Potassium 3.7 3.5 - 5.1 mEq/L    Chloride 106 98 - 107 mEq/L    CO2 24 21 - 32 mEq/L    Glucose 116 (H) 74 - 106 mg/dl    BUN 11 7 - 25 mg/dl    Creatinine 1.3 0.6 - 1.3 mg/dl    GFR est AA >60      GFR est non-AA 57      Calcium 8.4 (L) 8.5 - 10.1 mg/dl    AST (SGOT) 58 (H) 15 - 37 U/L    ALT (SGPT) 28 12 - 78 U/L    Alk. phosphatase 135 (H) 45 - 117 U/L    Bilirubin, total 0.6 0.2 - 1.0 mg/dl    Protein, total 7.9 6.4 - 8.2 gm/dl    Albumin 3.0 (L) 3.4 - 5.0 gm/dl    Anion gap 8 5 - 15 mmol/L   PROCALCITONIN    Collection Time: 01/10/19 10:20 AM   Result Value Ref Range    PROCALCITONIN 0.08 0.00 - 0.50 ng/ml   CK    Collection Time: 01/10/19 10:20 AM   Result Value Ref Range    CK 68 39 - 308 U/L   C REACTIVE PROTEIN, QT    Collection Time: 01/10/19  10:20 AM   Result Value Ref Range    C-Reactive protein 137.0 (H) 0.0 - 2.9 mg/L   FERRITIN    Collection  Time: 01/10/19 10:20 AM   Result Value Ref Range    Ferritin 137.3 26.0 - 388.0 ng/ml   LD    Collection Time: 01/10/19 10:20 AM   Result Value Ref Range    LD 358 (H) 87 - 241 U/L   FIBRINOGEN    Collection Time: 01/10/19 10:20 AM   Result Value Ref Range    Fibrinogen 420 (H) 220 - 397 mg/dl   D DIMER    Collection Time: 01/10/19 10:20 AM   Result Value Ref Range    D DIMER 0.63 (H) 0.01 - 0.50 ug/mL (FEU)   TROPONIN I    Collection Time: 01/10/19 10:20 AM   Result Value Ref Range    Troponin-I 0.026 0.000 - 0.045 ng/ml   LACTIC ACID    Collection Time: 01/10/19 10:30 AM   Result Value Ref Range    Lactic Acid 2.0 0.4 - 2.0 mmol/L   [id number]-[id number]-2_FLU    Collection Time: 01/10/19 10:50 AM    Specimen: NASOPHARYNGEAL SWAB   Result Value Ref Range    COVID-19 POSITIVE (A) NEGATIVE     BLOOD TYPE, (ABO+RH)    Collection Time: 01/10/19 11:30 AM   Result Value Ref Range    ABO/Rh(D) A Rh Negative     LEGIONELLA PNEUMOPHILA AG, URINE    Collection Time: 01/10/19  1:05 PM    Specimen: Urine   Result Value Ref Range    Legionella Ag, urine NEGATIVE NEGATIVE     S.PNEUMO AG, UR/CSF    Collection Time: 01/10/19  1:05 PM   Result Value Ref Range    Strep pneumo Ag, urine POSITIVE (A) NEGATIVE     ANTIBODY SCREEN    Collection Time: 01/10/19  1:13 PM   Result Value Ref Range    Antibody screen NEG     POC URINE MACROSCOPIC    Collection Time: 01/10/19  1:21 PM   Result Value Ref Range    Glucose Negative NEGATIVE,Negative mg/dl    Bilirubin Small (A) NEGATIVE,Negative      Ketone Trace (A) NEGATIVE,Negative mg/dl    Specific gravity 1.025 1.005 - 1.030      Blood Moderate (A) NEGATIVE,Negative      pH (UA) 5.5 5 - 9      Protein 100 (A) NEGATIVE,Negative mg/dl    Urobilinogen 0.2 0.0 - 1.0 EU/dl    Nitrites Negative NEGATIVE,Negative      Leukocyte Esterase Negative NEGATIVE,Negative      Color Dark yellow      Appearance  Cloudy     GLUCOSE, POC    Collection Time: 01/10/19  2:42 PM   Result Value Ref Range    Glucose (POC) 126 (H) 65 - 105 mg/dL         Active Problems:    Pneumonia due to COVID-19 virus (01/10/2019)        Assessment:       1. COVID-19 infection  2. Multifocal pneumonia  3. Pancytopenia  4. Hypertension  5. CAD status post CABG  6. Gout  7. Pancreatic cancer  8. Neuropathy    Plan:       ?? Patient admitted to medical service  ?? Continue supplemental oxygen  ?? Discussed therapeutic for COVID-19 with the patient including convalescent plasma transfusion, dexamethasone and remdesivir.  Patient consented and willing to start alternate treatment.  ?? Start him on IV antibiotics with ceftriaxone and Zithromax  ?? Resume home medication  ?? DVT prophylaxis    Advanced  care planning: Discussed advanced care planning with his wife.  Discussed his diagnosis for COVID-19 and the therapeutic plan.  Both patient and his wife consented for convalescent plasma, remdesivir and dexamethasone.  Patient will remain full CODE STATUS.  His wife can be reached at [phone].  Time spent for advanced care planning 18 minutes    Total time:  76 minutes        _______________________________________________________________________        Attending Physician: Stanton Kidney, MD       Copies: Daralene Milch, MD    Dragon medical dictation software was used for portions of this report. Unintended voice recognition errors may occur.

## 2019-01-10 NOTE — Progress Notes (Signed)
PAGER ID: [phone]  MESSAGE: [place] new admit from ED arrived, has temperature and c/o nausea. Blood pressure elevated. Please advise. Thank you, Kasandra x [phone]

## 2019-01-10 NOTE — Progress Notes (Signed)
 Pt did not want to eat or drink anything, stated he was too nauseous. Also refused Zofran . Stated it made him sicker down in the ER. Pt stated sleep is all he needs.

## 2019-01-10 NOTE — Progress Notes (Signed)
PAGER ID: [phone]  MESSAGE: [place] can I get a prn for Tylenol please? Thank you, Tommy Brown x [phone]    Pt has allergy to oxycodone/acetaminophen. Will administer Toradol and monitor pt.

## 2019-01-10 NOTE — ED Provider Notes (Signed)
EMERGENCY DEPARTMENT HISTORY AND PHYSICAL EXAM    10:20 AM      Date: 01/10/2019  Patient Name: Tommy Brown    History of Presenting Illness     Chief Complaint   Patient presents with   ??? Flu Like Symptoms   ??? Concern For COVID-19 (Coronavirus)         History Provided By: Patient      Additional History (Context): Tommy Brown is a 73 y.o. male who presents with-like symptoms.  Patient states his symptom onset was last Tuesday where he has had low-grade fevers cough general malaise body aches called 911 this morning because he was feeling worse and did not know what else to do.  He has had some dry heaving decreased appetite denies any abdominal pain denies any flank pain dysuria social history does live at home denies any alcohol or tobacco use.    PCP: Daralene Milch, MD      Current Facility-Administered Medications   Medication Dose Route Frequency Provider Last Rate Last Dose   ??? piperacillin-tazobactam (ZOSYN) 3.375 g IVPB (premix)  3.375 g IntraVENous Q8H Hilbert Corrigan, MD       ??? dexamethasone (DECADRON) 4 mg/mL injection 10 mg  10 mg IntraVENous NOW Hilbert Corrigan, MD       ??? ondansetron (ZOFRAN) injection 4 mg  4 mg IntraVENous NOW Hilbert Corrigan, MD         Current Outpatient Medications   Medication Sig Dispense Refill   ??? testosterone (ANDROGEL) 20.25 mg/1.25 gram (1.62 %) gel Apply 20 mg to affected area daily. Max Daily Amount: 20 mg. 6 Bottle 2   ??? bimatoprost (Lumigan) 0.01 % ophthalmic drops Lumigan 0.01 % eye drops     ??? promethazine (PHENERGAN) 12.5 mg tablet promethazine 12.5 mg tablet   TAKE 1 TABLET BY MOUTH 2 TIMES PER DAY AS NEEDED     ??? testosterone cypionate (DEPOTESTOTERONE CYPIONATE) 200 mg/mL injection INJECT 0.5 MILLILITER INTO THE MUSCLE EVERY 2 WEEKS DISCARD VIAL AFTER USE     ??? metoprolol succinate (TOPROL-XL) 25 mg XL tablet TAKE 1 TABLET BY MOUTH EVERY DAY     ??? ketorolac (TORADOL) 10 mg tablet Take 1 Tab by mouth three (3) times daily as needed for  Pain. 10 Tab 0   ??? lipase-protease-amylase (Creon) 24,000-76,000 -120,000 unit capsule Take  by mouth.     ??? lipase-protease-amylase (Creon) 24,000-76,000 -120,000 unit capsule Take  by mouth daily as needed (with snacks and meals).     ??? temazepam (RESTORIL) 7.5 mg capsule Take  by mouth nightly.     ??? tamsulosin (Flomax) 0.4 mg capsule Take 2 Caps by mouth daily. 60 Cap 1   ??? lactobacillus rhamnosus gg 10 billion cell (Culturelle) 10 billion cell capsule Take 1 Cap by mouth Before breakfast and dinner. 40 Cap 0   ??? peg 400-propylene glycol (Systane, propylene glycol,) 0.4-0.3 % drop Administer 2 Drops to both eyes daily. Patient places two drops in both eyes every AM.   Indications: dry eye     ??? metoprolol tartrate (LOPRESSOR) 25 mg tablet Take 25 mg by mouth daily.     ??? gabapentin (NEURONTIN) 600 mg tablet Take 600 mg by mouth two (2) times a day.     ??? amLODIPine (NORVASC) 5 mg tablet Take 5 mg by mouth daily.     ??? allopurinol (ZYLOPRIM) 300 mg tablet Take  by mouth daily.     ??? aspirin  81 mg chewable tablet Take 81 mg by mouth daily.     ??? TESTOSTERONE IM by IntraMUSCular route.         Past History     Past Medical History:  Past Medical History:   Diagnosis Date   ??? Arthritis    ??? Benign prostate hyperplasia    ??? Bladder infection    ??? Chronic kidney disease    ??? Coronary arteriosclerosis    ??? Coronary atherosclerosis of artery bypass graft    ??? Decreased testosterone level    ??? Diverticular disease    ??? Essential hypertension    ??? Gout    ??? History of acute renal failure    ??? History of anemia    ??? History of malignant neoplasm of pancreas    ??? Hx of CABG    ??? Kidney stone    ??? Kidney stones    ??? Neuropathy    ??? Pancreatic cancer ([place])    ??? Sepsis ([place])    ??? Tremor        Past Surgical History:  Past Surgical History:   Procedure Laterality Date   ??? HX CERVICAL FUSION     ??? HX COLONOSCOPY     ??? HX CORONARY ARTERY BYPASS GRAFT  1995   ??? HX HEART CATHETERIZATION  2006   ??? HX ORTHOPAEDIC      back   ??? HX  OTHER SURGICAL  2006    whipple procedure done for pancreatic cancer   ??? HX UROLOGICAL  2012    Percutaneous extraction of a kidney stone w/ fragmentation procedure        Family History:  Family History   Problem Relation Age of Onset   ??? Alcohol abuse Father    ??? Heart Disease Brother    ??? Emphysema Brother        Social History:  Social History     Tobacco Use   ??? Smoking status: Never Smoker   ??? Smokeless tobacco: Never Used   Substance Use Topics   ??? Alcohol use: Not Currently   ??? Drug use: Not Currently       Allergies:  Allergies   Allergen Reactions   ??? Other Food Other (comments)     VISTAID  ANESTHESIA   ??? Codeine Itching and Swelling   ??? Oxycodone Anaphylaxis   ??? Hydroxyzine Pamoate Itching and Other (comments)     Other reaction(s): Unknown     ??? Meperidine Itching, Palpitations, Swelling and Other (comments)     Other reaction(s): Unknown     ??? Metoclopramide Itching, Other (comments) and Swelling     Other reaction(s): Other (See Comments)  Reaction unknown   Reaction unknown      ??? Morphine Other (comments) and Nausea and Vomiting   ??? Oxycodone-Acetaminophen Itching   ??? Prochlorperazine Itching, Other (comments) and Unknown (comments)     Other reaction(s): Unknown  Unsure of reaction     ??? Hydroxyzine Hcl Hives         Review of Systems       Review of Systems   Constitutional: Positive for appetite change, diaphoresis, fatigue and fever. Negative for chills.   HENT: Positive for congestion. Negative for rhinorrhea.    Eyes: Negative for visual disturbance.   Respiratory: Positive for cough and chest tightness. Negative for shortness of breath and wheezing.    Cardiovascular: Negative for chest pain.   Gastrointestinal: Positive for nausea and vomiting. Negative for abdominal  pain.   Endocrine: Negative for polyphagia and polyuria.   Genitourinary: Negative for dysuria and flank pain.   Musculoskeletal: Negative for back pain.   Skin: Negative for rash.   Neurological: Positive for light-headedness.  Negative for dizziness, syncope and weakness.   All other systems reviewed and are negative.        Physical Exam     Visit Vitals  BP (!) 155/72   Pulse 84   Temp 100 ??F (37.8 ??C)   Resp 25   Wt 61.2 kg (135 lb)   SpO2 92%   BMI 20.53 kg/m??       Physical Exam  Vitals signs and nursing note reviewed.   Constitutional:       General: He is not in acute distress.     Appearance: He is well-developed. He is not toxic-appearing or diaphoretic.   HENT:      Head: Normocephalic and atraumatic.      Mouth/Throat:      Mouth: Mucous membranes are dry.   Eyes:      General: No scleral icterus.     Conjunctiva/sclera: Conjunctivae normal.      Pupils: Pupils are equal, round, and reactive to light.   Neck:      Musculoskeletal: Normal range of motion and neck supple.   Cardiovascular:      Rate and Rhythm: Normal rate and regular rhythm.      Heart sounds: Murmur (4/6 systolic murmur) present.      Comments: Median sternotomy scar  Pulmonary:      Effort: Pulmonary effort is normal. No respiratory distress.      Breath sounds: Normal breath sounds.      Comments: Minimal rhonchi no respiratory distress no accessory muscle use  Abdominal:      General: Bowel sounds are normal. There is no distension.      Palpations: Abdomen is soft.      Tenderness: There is no abdominal tenderness.   Lymphadenopathy:      Cervical: No cervical adenopathy.   Skin:     General: Skin is warm and dry.      Findings: No rash.   Neurological:      Mental Status: He is alert and oriented to person, place, and time.      Coordination: Coordination normal.   Psychiatric:         Behavior: Behavior normal.           Diagnostic Study Results     Labs -  Recent Results (from the past 12 hour(s))   CBC WITH AUTOMATED DIFF    Collection Time: 01/10/19 10:20 AM   Result Value Ref Range    WBC 3.4 (L) 4.0 - 11.0 1000/mm3    RBC 4.65 3.80 - 5.70 M/uL    HGB 10.6 (L) 12.4 - 17.2 gm/dl    HCT 34.8 (L) 37.0 - 50.0 %    MCV 74.8 (L) 80.0 - 98.0 fL    MCH 22.8  (L) 23.0 - 34.6 pg    MCHC 30.5 30.0 - 36.0 gm/dl    PLATELET 131 (L) 140 - 450 1000/mm3    MPV 0.0 (L) 6.0 - 10.0 fL    NEUTROPHILS 78.5 (H) 34 - 64 %    LYMPHOCYTES 15.8 (L) 28 - 48 %    MONOCYTES 5.1 1 - 13 %   METABOLIC PANEL, COMPREHENSIVE    Collection Time: 01/10/19 10:20 AM   Result Value Ref Range  Sodium 138 136 - 145 mEq/L    Potassium 3.7 3.5 - 5.1 mEq/L    Chloride 106 98 - 107 mEq/L    CO2 24 21 - 32 mEq/L    Glucose 116 (H) 74 - 106 mg/dl    BUN 11 7 - 25 mg/dl    Creatinine 1.3 0.6 - 1.3 mg/dl    GFR est AA >60      GFR est non-AA 57      Calcium 8.4 (L) 8.5 - 10.1 mg/dl    AST (SGOT) 58 (H) 15 - 37 U/L    ALT (SGPT) 28 12 - 78 U/L    Alk. phosphatase 135 (H) 45 - 117 U/L    Bilirubin, total 0.6 0.2 - 1.0 mg/dl    Protein, total 7.9 6.4 - 8.2 gm/dl    Albumin 3.0 (L) 3.4 - 5.0 gm/dl    Anion gap 8 5 - 15 mmol/L   PROCALCITONIN    Collection Time: 01/10/19 10:20 AM   Result Value Ref Range    PROCALCITONIN 0.08 0.00 - 0.50 ng/ml   CK    Collection Time: 01/10/19 10:20 AM   Result Value Ref Range    CK 68 39 - 308 U/L   C REACTIVE PROTEIN, QT    Collection Time: 01/10/19 10:20 AM   Result Value Ref Range    C-Reactive protein 137.0 (H) 0.0 - 2.9 mg/L   FERRITIN    Collection Time: 01/10/19 10:20 AM   Result Value Ref Range    Ferritin 137.3 26.0 - 388.0 ng/ml   LD    Collection Time: 01/10/19 10:20 AM   Result Value Ref Range    LD 358 (H) 87 - 241 U/L   FIBRINOGEN    Collection Time: 01/10/19 10:20 AM   Result Value Ref Range    Fibrinogen 420 (H) 220 - 397 mg/dl   D DIMER    Collection Time: 01/10/19 10:20 AM   Result Value Ref Range    D DIMER 0.63 (H) 0.01 - 0.50 ug/mL (FEU)   TROPONIN I    Collection Time: 01/10/19 10:20 AM   Result Value Ref Range    Troponin-I 0.026 0.000 - 0.045 ng/ml   LACTIC ACID    Collection Time: 01/10/19 10:30 AM   Result Value Ref Range    Lactic Acid 2.0 0.4 - 2.0 mmol/L   SARS-COV-2_FLU    Collection Time: 01/10/19 10:50 AM    Specimen: NASOPHARYNGEAL SWAB   Result  Value Ref Range    COVID-19 POSITIVE (A) NEGATIVE         Radiologic Studies -   XR CHEST SNGL V   Final Result   IMPRESSION:    Multiple patchy bilateral airspace opacities, concerning for multifocal   pneumonia.               Medical Decision Making   I am the first provider for this patient.    I reviewed the vital signs, available nursing notes, past medical history, past surgical history, family history and social history.    Vital Signs-Reviewed the patient's vital signs.      EKG: Sinus rhythm at a rate of 87 with no acute ST-T wave changes poor R wave progression QTC is 440.  Q waves interpreted by me at 10:15 AM    Records Reviewed: Nursing Notes and Old Medical Records (Time of Review: 10:20 AM)    ED Course: Progress Notes, Reevaluation, and Consults:  Provider Notes (Medical Decision Making):   MDM  Number of Diagnoses or Management Options  Multifocal pneumonia:   Diagnosis management comments: Concern for possible Covid, viral syndrome, pneumonia will check influenza gentle hydration sepsis work-up    cxr concerning for covid will send labs abx for possible bacterial pneumonia      12:47 PM  Patient is stable in the emergency department Decadron was started antibiotics were started although I believe this is more related to a Covid pneumonia discussed with Dr. Elisabeth Most he agrees with admission orders for admission to telemetry       Amount and/or Complexity of Data Reviewed  Clinical lab tests: ordered and reviewed  Tests in the radiology section of CPT??: ordered and reviewed    Risk of Complications, Morbidity, and/or Mortality  Presenting problems: high  Diagnostic procedures: moderate  Management options: moderate            Critical Care Time:       Diagnosis     Clinical Impression:   1. Multifocal pneumonia        Disposition: Admit    Follow-up Information    None          Patient's Medications   Start Taking    No medications on file   Continue Taking    ALLOPURINOL (ZYLOPRIM) 300 MG TABLET     Take  by mouth daily.    AMLODIPINE (NORVASC) 5 MG TABLET    Take 5 mg by mouth daily.    ASPIRIN 81 MG CHEWABLE TABLET    Take 81 mg by mouth daily.    BIMATOPROST (LUMIGAN) 0.01 % OPHTHALMIC DROPS    Lumigan 0.01 % eye drops    GABAPENTIN (NEURONTIN) 600 MG TABLET    Take 600 mg by mouth two (2) times a day.    KETOROLAC (TORADOL) 10 MG TABLET    Take 1 Tab by mouth three (3) times daily as needed for Pain.    LACTOBACILLUS RHAMNOSUS GG 10 BILLION CELL (CULTURELLE) 10 BILLION CELL CAPSULE    Take 1 Cap by mouth Before breakfast and dinner.    LIPASE-PROTEASE-AMYLASE (CREON) 24,000-76,000 -120,000 UNIT CAPSULE    Take  by mouth.    LIPASE-PROTEASE-AMYLASE (CREON) 24,000-76,000 -120,000 UNIT CAPSULE    Take  by mouth daily as needed (with snacks and meals).    METOPROLOL SUCCINATE (TOPROL-XL) 25 MG XL TABLET    TAKE 1 TABLET BY MOUTH EVERY DAY    METOPROLOL TARTRATE (LOPRESSOR) 25 MG TABLET    Take 25 mg by mouth daily.    PEG 400-PROPYLENE GLYCOL (SYSTANE, PROPYLENE GLYCOL,) 0.4-0.3 % DROP    Administer 2 Drops to both eyes daily. Patient places two drops in both eyes every AM.   Indications: dry eye    PROMETHAZINE (PHENERGAN) 12.5 MG TABLET    promethazine 12.5 mg tablet   TAKE 1 TABLET BY MOUTH 2 TIMES PER DAY AS NEEDED    TAMSULOSIN (FLOMAX) 0.4 MG CAPSULE    Take 2 Caps by mouth daily.    TEMAZEPAM (RESTORIL) 7.5 MG CAPSULE    Take  by mouth nightly.    TESTOSTERONE (ANDROGEL) 20.25 MG/1.25 GRAM (1.62 %) GEL    Apply 20 mg to affected area daily. Max Daily Amount: 20 mg.    TESTOSTERONE CYPIONATE (DEPOTESTOTERONE CYPIONATE) 200 MG/ML INJECTION    INJECT 0.5 MILLILITER INTO THE MUSCLE EVERY 2 WEEKS DISCARD VIAL AFTER USE    TESTOSTERONE IM    by IntraMUSCular route.  These Medications have changed    No medications on file   Stop Taking    No medications on file     _______________________________    Please note that this dictation was completed with Dragon, the computer voice recognition software.  Quite  often unanticipated grammatical, syntax, homophones, and other interpretive errors are inadvertently transcribed by the computer software.  Please disregard these errors.  Please excuse any errors that have escaped final proofreading.

## 2019-01-10 NOTE — Progress Notes (Signed)
PAGER ID: [phone]  MESSAGE: [phone] Tommy Brown, W - pt is not tolerating PO at this time, is it possible to get the Toradol as IV? Thanks, Kasandra RN x [phone]    Order changed to IV.

## 2019-01-11 LAB — METABOLIC PANEL, COMPREHENSIVE
ALT (SGPT): 24 U/L (ref 12–78)
AST (SGOT): 54 U/L — ABNORMAL HIGH (ref 15–37)
Albumin: 2.5 gm/dl — ABNORMAL LOW (ref 3.4–5.0)
Alk. phosphatase: 114 U/L (ref 45–117)
Anion gap: 9 mmol/L (ref 5–15)
BUN: 12 mg/dl (ref 7–25)
Bilirubin, total: 0.5 mg/dl (ref 0.2–1.0)
CO2: 23 mEq/L (ref 21–32)
Calcium: 8.3 mg/dl — ABNORMAL LOW (ref 8.5–10.1)
Chloride: 107 mEq/L (ref 98–107)
Creatinine: 1.2 mg/dl (ref 0.6–1.3)
GFR est AA: >60
GFR est non-AA: >60
Glucose: 147 mg/dl — ABNORMAL HIGH (ref 74–106)
Potassium: 3.9 mEq/L (ref 3.5–5.1)
Protein, total: 7.2 gm/dl (ref 6.4–8.2)
Sodium: 140 mEq/L (ref 136–145)

## 2019-01-11 LAB — CBC WITH AUTOMATED DIFF
HCT: 33.1 % — ABNORMAL LOW (ref 37.0–50.0)
HGB: 10.1 gm/dl — ABNORMAL LOW (ref 12.4–17.2)
LYMPHOCYTES: 8 % — ABNORMAL LOW (ref 28–48)
MCH: 22.6 pg — ABNORMAL LOW (ref 23.0–34.6)
MCHC: 30.5 gm/dl (ref 30.0–36.0)
MCV: 74.2 fL — ABNORMAL LOW (ref 80.0–98.0)
MONOCYTES: 1.1 % (ref 1–13)
NEUTROPHILS: 90.9 % — ABNORMAL HIGH (ref 34–64)
PLATELET COMMENTS: DECREASED
PLATELET: 130 10*3/uL — ABNORMAL LOW (ref 140–450)
RBC: 4.46 M/uL (ref 3.80–5.70)
RDW-SD: 58.8 — ABNORMAL HIGH (ref 35.1–43.9)
WBC: 2.7 10*3/uL — ABNORMAL LOW (ref 4.0–11.0)

## 2019-01-11 LAB — MAGNESIUM
Magnesium: 1.9 mg/dl (ref 1.6–2.6)
Magnesium: 1.9 mg/dl (ref 1.6–2.6)

## 2019-01-11 LAB — LACTIC ACID
LACTIC ACID: 1.1 mmol/L (ref 0.4–2.0)
LACTIC ACID: 1.7 mmol/L (ref 0.4–2.0)
Lactic Acid: 1.1 mmol/L (ref 0.4–2.0)
Lactic Acid: 1.7 mmol/L (ref 0.4–2.0)

## 2019-01-11 LAB — CBC WITH AUTO DIFFERENTIAL
Hematocrit: 33.1 % — ABNORMAL LOW (ref 37.0–50.0)
Hemoglobin: 10.1 gm/dl — ABNORMAL LOW (ref 12.4–17.2)
Lymphocytes %: 8 % — ABNORMAL LOW (ref 28–48)
MCH: 22.6 pg — ABNORMAL LOW (ref 23.0–34.6)
MCHC: 30.5 gm/dl (ref 30.0–36.0)
MCV: 74.2 fL — ABNORMAL LOW (ref 80.0–98.0)
Monocytes %: 1.1 % (ref 1–13)
Neutrophils %: 90.9 % — ABNORMAL HIGH (ref 34–64)
Platelet Comment: DECREASED
Platelets: 130 10*3/uL — ABNORMAL LOW (ref 140–450)
RBC: 4.46 M/uL (ref 3.80–5.70)
RDW-SD: 58.8 — ABNORMAL HIGH (ref 35.1–43.9)
WBC: 2.7 10*3/uL — ABNORMAL LOW (ref 4.0–11.0)

## 2019-01-11 LAB — COMPREHENSIVE METABOLIC PANEL
ALT: 24 U/L (ref 12–78)
AST: 54 U/L — ABNORMAL HIGH (ref 15–37)
Albumin: 2.5 gm/dl — ABNORMAL LOW (ref 3.4–5.0)
Alkaline Phosphatase: 114 U/L (ref 45–117)
Anion Gap: 9 mmol/L (ref 5–15)
BUN: 12 mg/dl (ref 7–25)
CO2: 23 mEq/L (ref 21–32)
Calcium: 8.3 mg/dl — ABNORMAL LOW (ref 8.5–10.1)
Chloride: 107 mEq/L (ref 98–107)
Creatinine: 1.2 mg/dl (ref 0.6–1.3)
EGFR IF NonAfrican American: >60
GFR African American: >60
Glucose: 147 mg/dl — ABNORMAL HIGH (ref 74–106)
Potassium: 3.9 mEq/L (ref 3.5–5.1)
Sodium: 140 mEq/L (ref 136–145)
Total Bilirubin: 0.5 mg/dl (ref 0.2–1.0)
Total Protein: 7.2 gm/dl (ref 6.4–8.2)

## 2019-01-11 MED ORDER — DEXTROSE 5% IN NORMAL SALINE IV
INTRAVENOUS | Status: DC
Start: 2019-01-11 — End: 2019-01-12
  Administered 2019-01-11 – 2019-01-12 (×2): via INTRAVENOUS

## 2019-01-11 MED FILL — FLORASTOR 250 MG CAPSULE: 250 mg | ORAL | Qty: 1

## 2019-01-11 MED FILL — ALLOPURINOL 300 MG TAB: 300 mg | ORAL | Qty: 1

## 2019-01-11 MED FILL — ONDANSETRON (PF) 4 MG/2 ML INJECTION: 4 mg/2 mL | INTRAMUSCULAR | Qty: 2

## 2019-01-11 MED FILL — GABAPENTIN 300 MG CAP: 300 mg | ORAL | Qty: 2

## 2019-01-11 MED FILL — DEXAMETHASONE SODIUM PHOSPHATE 4 MG/ML IJ SOLN: 4 mg/mL | INTRAMUSCULAR | Qty: 2

## 2019-01-11 MED FILL — METOPROLOL SUCCINATE SR 25 MG 24 HR TAB: 25 mg | ORAL | Qty: 1

## 2019-01-11 MED FILL — AMLODIPINE 5 MG TAB: 5 mg | ORAL | Qty: 2

## 2019-01-11 MED FILL — POLYVINYL ALCOHOL-POVIDONE 1.4 %-0.6 % EYE DROPPERETTE: OPHTHALMIC | Qty: 1

## 2019-01-11 MED FILL — TEMAZEPAM 7.5 MG CAP: 7.5 mg | ORAL | Qty: 1

## 2019-01-11 MED FILL — AZITHROMYCIN 500 MG IV SOLUTION: 500 mg | INTRAVENOUS | Qty: 5

## 2019-01-11 MED FILL — HYDRALAZINE 20 MG/ML IJ SOLN: 20 mg/mL | INTRAMUSCULAR | Qty: 1

## 2019-01-11 MED FILL — TAMSULOSIN SR 0.4 MG 24 HR CAP: 0.4 mg | ORAL | Qty: 2

## 2019-01-11 MED FILL — DEXTROSE 5% IN NORMAL SALINE IV: INTRAVENOUS | Qty: 1000

## 2019-01-11 MED FILL — KETOROLAC TROMETHAMINE 15 MG/ML INJECTION: 15 mg/mL | INTRAMUSCULAR | Qty: 1

## 2019-01-11 MED FILL — ENOXAPARIN 40 MG/0.4 ML SUB-Q SYRINGE: 40 mg/0.4 mL | SUBCUTANEOUS | Qty: 0.4

## 2019-01-11 MED FILL — CEFTRIAXONE 1 GRAM SOLUTION FOR INJECTION: 1 gram | INTRAMUSCULAR | Qty: 1

## 2019-01-11 MED FILL — CREON 24,000-76,000-120,000 UNIT CAPSULE,DELAYED RELEASE: 24000-76000 -120,000 unit | ORAL | Qty: 1

## 2019-01-11 NOTE — Other (Signed)
Bedside and Verbal shift change report given to Tinisha N Hawkins, LPN   (oncoming nurse) by Fraulein, RN (offgoing nurse). Report included the following information SBAR and Kardex.

## 2019-01-11 NOTE — Progress Notes (Signed)
Problem: Falls - Risk of  Goal: *Absence of Falls  Description: Document Patrcia Dolly Fall Risk and appropriate interventions in the flowsheet.  Outcome: Progressing Towards Goal  Note: Fall Risk Interventions:  Mobility Interventions: Bed/chair exit alarm         Medication Interventions: Bed/chair exit alarm, Patient to call before getting OOB    Elimination Interventions: Bed/chair exit alarm, Call light in reach              Problem: Patient Education: Go to Patient Education Activity  Goal: Patient/Family Education  Outcome: Progressing Towards Goal     Problem: Risk for Spread of Infection  Goal: Prevent transmission of infectious organism to others  Description: Prevent the transmission of infectious organisms to other patients, staff members, and visitors.  Outcome: Progressing Towards Goal

## 2019-01-11 NOTE — Progress Notes (Signed)
Explained plasma consent form to patient. Patient said " I want to do anything to get better" and consented to plasma. Consent signed and in chart.

## 2019-01-11 NOTE — Other (Addendum)
Patient in bed, attempted to get him up to walk,weak and SOB noted with 2 liters oxygen per NC. Encouraged to sit on side of bed, compliant and tolerates. No distress. Refused all meals this shift, states he does not have appetite. Patient with no IVF Informed Dr. Delene Loll. Orders given and entered. ( see order management)

## 2019-01-11 NOTE — Progress Notes (Signed)
Medical Progress Note      NAME: Tommy Brown   DOB:  07-03-1945  MRN:             [id number]    Date:  01/11/2019        Assessment:     1. COVID-19 infection  2. Multifocal pneumonia  3. Pancytopenia  4. Hypertension  5. CAD status post CABG  6. Gout  7. Pancreatic cancer  8. Neuropathy    Plan:     ?? Patient receiving convalescent plasma today  ?? Continue with dexamethasone 6 mg daily for total 10 days  ?? Patient also started on IV remdesivir for 5 days  ?? Monitor respiratory status  ?? Continue O2 as needed  ?? Continue antibiotics with ceftriaxone and Zithromax  ?? Discussed with his wife and updated    Subjective:     Shortness of breathing is better    Objective:     Vitals:    Last 24hrs VS reviewed since prior progress note. Most recent are:  Visit Vitals  BP 120/66 (BP 1 Location: Left arm, BP Patient Position: Supine)   Pulse 72   Temp 98 ??F (36.7 ??C)   Resp 17   Wt 61.2 kg (135 lb)   SpO2 91%   BMI 20.53 kg/m??     SpO2 Readings from Last 6 Encounters:   01/11/19 91%   07/31/18 98%   07/17/18 99%   06/26/18 97%   01/21/18 96%    O2 Flow Rate (L/min): 2 l/min       Intake/Output Summary (Last 24 hours) at 01/11/2019 1429  Last data filed at 01/11/2019 1259  Gross per 24 hour   Intake 870 ml   Output 1500 ml   Net -630 ml        Exam:     General:   Not in acute distress  HEENT: PERRLA, Neck Supple,  No JVD  Respiratory:   CTA bilaterally-no wheezes, rales, rhonchi, or crackles  Cardiac:  Regular Rate and Rythmn  - no murmurs, rubs or gallops  Abdominal:  Soft, non-tender, non-distended, positive bowel sounds  Extremities:  No cyanosis, or edema.  Skin: No rash  Neurological:  No focal neurological deficits    Medication:   Current Medications Reviewed    Current Facility-Administered Medications:   ???  allopurinoL (ZYLOPRIM) tablet 300 mg, 300 mg, Oral, DAILY, Johnni Wunschel, Tami Ribas, MD, 300 mg at 01/11/19 0932   ???  gabapentin (NEURONTIN) capsule 600 mg, 600 mg, Oral, BID, Esmirna Ravan, Tami Ribas, MD, 600 mg at 01/11/19 0931  ???  Saccharomyces boulardii (FLORASTOR) capsule 250 mg, 250 mg, Oral, ACB&D, Stanton Kidney, MD, 250 mg at 01/11/19 0601  ???  lipase-protease-amylase (CREON 24,000) capsule 1 Cap, 1 Cap, Oral, TID WITH MEALS, Anayi Bricco, Tami Ribas, MD, Stopped at 01/11/19 0800  ???  metoprolol succinate (TOPROL-XL) XL tablet 25 mg, 25 mg, Oral, DAILY, Stanton Kidney, MD, 25 mg at 01/11/19 0932  ???  polyvinyl alcohol-povidon(PF) (REFRESH CLASSIC) 1.4-0.6 % ophthalmic solution 2 Drop, 2 Drop, Both Eyes, DAILY, Josean Lycan, Tami Ribas, MD, 2 Drop at 01/11/19 0931  ???  tamsulosin (FLOMAX) capsule 0.8 mg, 0.8 mg, Oral, DAILY, Ebba Goll, Tami Ribas, MD, 0.8 mg at 01/11/19 0931  ???  temazepam (RESTORIL) capsule 7.5 mg, 7.5 mg, Oral, QHS PRN, Stanton Kidney, MD, 7.5 mg at 01/10/19 2208  ???  naloxone (NARCAN) injection 0.1 mg, 0.1 mg, IntraVENous, PRN, Delene Loll, Tami Ribas, MD  ???  cefTRIAXone (ROCEPHIN) 1 g in 0.9% sodium chloride (MBP/ADV) 50 mL MBP, 1 g, IntraVENous, Q24H, Jayion Schneck, Tami Ribas, MD, Stopped at 01/10/19 1725  ???  ondansetron (ZOFRAN) injection 4 mg, 4 mg, IntraVENous, Q4H PRN, Delene Loll, Tami Ribas, MD  ???  enoxaparin (LOVENOX) injection 40 mg, 40 mg, SubCUTAneous, Q24H, Jotham Ahn, Tami Ribas, MD, 40 mg at 01/10/19 1651  ???  amLODIPine (NORVASC) tablet 10 mg, 10 mg, Oral, DAILY, Saphyra Hutt, Tami Ribas, MD, 10 mg at 01/11/19 0932  ???  azithromycin (ZITHROMAX) 500 mg in 0.9% sodium chloride 250 mL IVPB, 500 mg, IntraVENous, Q24H, Windell Musson, Tami Ribas, MD, Last Rate: 250 mL/hr at 01/10/19 1855, 500 mg at 01/10/19 1855  ???  hydrALAZINE (APRESOLINE) 20 mg/mL injection 10 mg, 10 mg, IntraVENous, Q6H PRN, Rodricus Candelaria, Tami Ribas, MD  ???  0.9% sodium chloride infusion 250 mL, 250 mL, IntraVENous, PRN, Ashari Llewellyn, Tami Ribas, MD  ???  dexamethasone (DECADRON) 4 mg/mL injection 6 mg, 6 mg, IntraVENous, Q24H, Kailynne Ferrington, Tami Ribas, MD, 6 mg at 01/10/19 1651   ???  [COMPLETED] remdesivir 200 mg in 0.9% sodium chloride 250 mL IVPB, 200 mg, IntraVENous, ONCE, Stopped at 01/10/19 1855 **FOLLOWED BY** remdesivir 100 mg in 0.9% sodium chloride 250 mL IVPB, 100 mg, IntraVENous, QHS, Sanjit Mcmichael, Tami Ribas, MD  ???  ketorolac (TORADOL) injection 15 mg, 15 mg, IntraVENous, Q8H PRN, Stanton Kidney, MD, 15 mg at 01/11/19 1610      Lab:     Lab Data Reviewed: (see below)  Recent Results (from the past 24 hour(s))   GLUCOSE, POC    Collection Time: 01/10/19  2:42 PM   Result Value Ref Range    Glucose (POC) 126 (H) 65 - 105 mg/dL   LACTIC ACID    Collection Time: 01/10/19  3:27 PM   Result Value Ref Range    Lactic Acid 2.2 (H) 0.4 - 2.0 mmol/L   CONVALESCENT PLASMA, ALLOCATE    Collection Time: 01/10/19  3:45 PM   Result Value Ref Range    Product ID FFP      Unit number [id number]      Status info. Issued      Product code [id number]      Blood type code 6200      Issue date/time [id number]      Specimen expiration date [id number]     CBC WITH AUTOMATED DIFF    Collection Time: 01/10/19  5:11 PM   Result Value Ref Range    WBC 3.4 (L) 4.0 - 11.0 1000/mm3    RBC 4.35 3.80 - 5.70 M/uL    HGB 10.1 (L) 12.4 - 17.2 gm/dl    HCT 32.4 (L) 37.0 - 50.0 %    MCV 74.5 (L) 80.0 - 98.0 fL    MCH 23.2 23.0 - 34.6 pg    MCHC 31.2 30.0 - 36.0 gm/dl    PLATELET 128 (L) 140 - 450 1000/mm3    RDW-SD 58.6 (H) 35.1 - 43.9      NEUTROPHILS 86.7 (H) 34 - 64 %    LYMPHOCYTES 10.3 (L) 28 - 48 %    MONOCYTES 2.4 1 - 13 %   METABOLIC PANEL, COMPREHENSIVE    Collection Time: 01/10/19  5:11 PM   Result Value Ref Range    Sodium 138 136 - 145 mEq/L    Potassium 4.0 3.5 - 5.1 mEq/L    Chloride 107 98 - 107 mEq/L    CO2 22 21 - 32 mEq/L    Glucose  131 (H) 74 - 106 mg/dl    BUN 10 7 - 25 mg/dl    Creatinine 1.2 0.6 - 1.3 mg/dl    GFR est AA >60      GFR est non-AA >60      Calcium 8.2 (L) 8.5 - 10.1 mg/dl    AST (SGOT) 64 (H) 15 - 37 U/L    ALT (SGPT) 27 12 - 78 U/L    Alk. phosphatase 118 (H) 45 - 117 U/L     Bilirubin, total 0.6 0.2 - 1.0 mg/dl    Protein, total 7.2 6.4 - 8.2 gm/dl    Albumin 2.6 (L) 3.4 - 5.0 gm/dl    Anion gap 9 5 - 15 mmol/L   LACTIC ACID    Collection Time: 01/10/19  7:25 PM   Result Value Ref Range    Lactic Acid 1.1 0.4 - 2.0 mmol/L   LACTIC ACID    Collection Time: 01/10/19 11:51 PM   Result Value Ref Range    Lactic Acid 1.7 0.4 - 2.0 mmol/L   CBC WITH AUTOMATED DIFF    Collection Time: 01/10/19 11:51 PM   Result Value Ref Range    WBC 2.7 (L) 4.0 - 11.0 1000/mm3    RBC 4.46 3.80 - 5.70 M/uL    HGB 10.1 (L) 12.4 - 17.2 gm/dl    HCT 33.1 (L) 37.0 - 50.0 %    MCV 74.2 (L) 80.0 - 98.0 fL    MCH 22.6 (L) 23.0 - 34.6 pg    MCHC 30.5 30.0 - 36.0 gm/dl    PLATELET 130 (L) 140 - 450 1000/mm3    RDW-SD 58.8 (H) 35.1 - 43.9      NEUTROPHILS 90.9 (H) 34 - 64 %    LYMPHOCYTES 8.0 (L) 28 - 48 %    MONOCYTES 1.1 1 - 13 %    Poikilocytosis OCCASIONAL      Anisocytosis 2+      Polychromasia 1+      Microcytes 2+      Elliptocytes OCCASIONAL      PLATELET COMMENTS DECREASED      Giant platelets OCCASIONAL     MAGNESIUM    Collection Time: 01/10/19 11:51 PM   Result Value Ref Range    Magnesium 1.9 1.6 - 2.6 mg/dl   METABOLIC PANEL, COMPREHENSIVE    Collection Time: 01/10/19 11:51 PM   Result Value Ref Range    Sodium 140 136 - 145 mEq/L    Potassium 3.9 3.5 - 5.1 mEq/L    Chloride 107 98 - 107 mEq/L    CO2 23 21 - 32 mEq/L    Glucose 147 (H) 74 - 106 mg/dl    BUN 12 7 - 25 mg/dl    Creatinine 1.2 0.6 - 1.3 mg/dl    GFR est AA >60      GFR est non-AA >60      Calcium 8.3 (L) 8.5 - 10.1 mg/dl    AST (SGOT) 54 (H) 15 - 37 U/L    ALT (SGPT) 24 12 - 78 U/L    Alk. phosphatase 114 45 - 117 U/L    Bilirubin, total 0.5 0.2 - 1.0 mg/dl    Protein, total 7.2 6.4 - 8.2 gm/dl    Albumin 2.5 (L) 3.4 - 5.0 gm/dl    Anion gap 9 5 - 15 mmol/L       No results found.    Active Problems:    Pneumonia due to COVID-19  virus (01/10/2019)      ____________________________________________________________________       Attending Physician: Stanton Kidney, MD       Dragon medical dictation software was used for portions of this report. Unintended voice recognition errors may occur.

## 2019-01-11 NOTE — Other (Signed)
Patient having complaints of no pain. C/O  nausea x 1, no vomiting. Patient is not ambulating in the halls and to the bathroom , gets SOB, MD aware, no episodes of distress.. Will continue to monitor.

## 2019-01-11 NOTE — Progress Notes (Signed)
Medical Progress Note      NAME: Tommy Brown   DOB:  Oct 18, 1945  MRN:             [id number]    Date:  01/11/2019        Assessment:     1. COVID-19 infection  2. Multifocal pneumonia  3. Pancytopenia  4. Hypertension  5. CAD status post CABG  6. Gout  7. Pancreatic cancer  8. Neuropathy    Plan:     ?? Patient receiving convalescent plasma today  ?? Continue with dexamethasone 6 mg daily for total 10 days  ?? Patient also started on IV remdesivir for 5 days  ?? Monitor respiratory status  ?? Continue O2 as needed  ?? Continue antibiotics with ceftriaxone and Zithromax  ?? Discussed with his wife and updated    Subjective:     Shortness of breathing is better    Objective:     Vitals:    Last 24hrs VS reviewed since prior progress note. Most recent are:  Visit Vitals  BP 120/66 (BP 1 Location: Left arm, BP Patient Position: Supine)   Pulse 72   Temp 98 ??F (36.7 ??C)   Resp 17   Wt 61.2 kg (135 lb)   SpO2 91%   BMI 20.53 kg/m??     SpO2 Readings from Last 6 Encounters:   01/11/19 91%   07/31/18 98%   07/17/18 99%   06/26/18 97%   01/21/18 96%    O2 Flow Rate (L/min): 2 l/min       Intake/Output Summary (Last 24 hours) at 01/11/2019 1429  Last data filed at 01/11/2019 1259  Gross per 24 hour   Intake 870 ml   Output 1500 ml   Net -630 ml        Exam:     General:   Not in acute distress  HEENT: PERRLA, Neck Supple,  No JVD  Respiratory:   CTA bilaterally-no wheezes, rales, rhonchi, or crackles  Cardiac:  Regular Rate and Rythmn  - no murmurs, rubs or gallops  Abdominal:  Soft, non-tender, non-distended, positive bowel sounds  Extremities:  No cyanosis, or edema.  Skin: No rash  Neurological:  No focal neurological deficits    Medication:   Current Medications Reviewed    Current Facility-Administered Medications:   ???  allopurinoL (ZYLOPRIM) tablet 300 mg, 300 mg, Oral, DAILY, Briselda Naval, Tami Ribas, MD, 300 mg at 01/11/19 0932  ???  gabapentin (NEURONTIN) capsule 600 mg, 600 mg, Oral, BID, Sadaf Przybysz, Tami Ribas, MD,  600 mg at 01/11/19 0931  ???  Saccharomyces boulardii (FLORASTOR) capsule 250 mg, 250 mg, Oral, ACB&D, Stanton Kidney, MD, 250 mg at 01/11/19 0601  ???  lipase-protease-amylase (CREON 24,000) capsule 1 Cap, 1 Cap, Oral, TID WITH MEALS, Hiawatha Merriott, Tami Ribas, MD, Stopped at 01/11/19 0800  ???  metoprolol succinate (TOPROL-XL) XL tablet 25 mg, 25 mg, Oral, DAILY, Stanton Kidney, MD, 25 mg at 01/11/19 0932  ???  polyvinyl alcohol-povidon(PF) (REFRESH CLASSIC) 1.4-0.6 % ophthalmic solution 2 Drop, 2 Drop, Both Eyes, DAILY, Silvano Garofano, Tami Ribas, MD, 2 Drop at 01/11/19 0931  ???  tamsulosin (FLOMAX) capsule 0.8 mg, 0.8 mg, Oral, DAILY, Atticus Wedin, Tami Ribas, MD, 0.8 mg at 01/11/19 0931  ???  temazepam (RESTORIL) capsule 7.5 mg, 7.5 mg, Oral, QHS PRN, Stanton Kidney, MD, 7.5 mg at 01/10/19 2208  ???  naloxone (NARCAN) injection 0.1 mg, 0.1 mg, IntraVENous, PRN, Delene Loll, Tami Ribas, MD  ???  cefTRIAXone (ROCEPHIN) 1 g in 0.9% sodium chloride (MBP/ADV) 50 mL MBP, 1 g, IntraVENous, Q24H, Erline Siddoway, Tami Ribas, MD, Stopped at 01/10/19 1725  ???  ondansetron (ZOFRAN) injection 4 mg, 4 mg, IntraVENous, Q4H PRN, Delene Loll, Tami Ribas, MD  ???  enoxaparin (LOVENOX) injection 40 mg, 40 mg, SubCUTAneous, Q24H, Jaleena Viviani, Tami Ribas, MD, 40 mg at 01/10/19 1651  ???  amLODIPine (NORVASC) tablet 10 mg, 10 mg, Oral, DAILY, Evah Rashid, Tami Ribas, MD, 10 mg at 01/11/19 0932  ???  azithromycin (ZITHROMAX) 500 mg in 0.9% sodium chloride 250 mL IVPB, 500 mg, IntraVENous, Q24H, Deaunna Olarte, Tami Ribas, MD, Last Rate: 250 mL/hr at 01/10/19 1855, 500 mg at 01/10/19 1855  ???  hydrALAZINE (APRESOLINE) 20 mg/mL injection 10 mg, 10 mg, IntraVENous, Q6H PRN, Russie Gulledge, Tami Ribas, MD  ???  0.9% sodium chloride infusion 250 mL, 250 mL, IntraVENous, PRN, Avarey Yaeger, Tami Ribas, MD  ???  dexamethasone (DECADRON) 4 mg/mL injection 6 mg, 6 mg, IntraVENous, Q24H, Nazair Fortenberry, Tami Ribas, MD, 6 mg at 01/10/19 1651  ???  [COMPLETED] remdesivir 200 mg in 0.9% sodium chloride 250 mL IVPB, 200 mg, IntraVENous, ONCE, Stopped at 01/10/19 1855 **FOLLOWED BY**  remdesivir 100 mg in 0.9% sodium chloride 250 mL IVPB, 100 mg, IntraVENous, QHS, Bryant Saye, Tami Ribas, MD  ???  ketorolac (TORADOL) injection 15 mg, 15 mg, IntraVENous, Q8H PRN, Stanton Kidney, MD, 15 mg at 01/11/19 0932      Lab:     Lab Data Reviewed: (see below)  Recent Results (from the past 24 hour(s))   GLUCOSE, POC    Collection Time: 01/10/19  2:42 PM   Result Value Ref Range    Glucose (POC) 126 (H) 65 - 105 mg/dL   LACTIC ACID    Collection Time: 01/10/19  3:27 PM   Result Value Ref Range    Lactic Acid 2.2 (H) 0.4 - 2.0 mmol/L   CONVALESCENT PLASMA, ALLOCATE    Collection Time: 01/10/19  3:45 PM   Result Value Ref Range    Product ID FFP      Unit number [id number]      Status info. Issued      Product code [id number]      Blood type code 6200      Issue date/time [id number]      Specimen expiration date [id number]     CBC WITH AUTOMATED DIFF    Collection Time: 01/10/19  5:11 PM   Result Value Ref Range    WBC 3.4 (L) 4.0 - 11.0 1000/mm3    RBC 4.35 3.80 - 5.70 M/uL    HGB 10.1 (L) 12.4 - 17.2 gm/dl    HCT 32.4 (L) 37.0 - 50.0 %    MCV 74.5 (L) 80.0 - 98.0 fL    MCH 23.2 23.0 - 34.6 pg    MCHC 31.2 30.0 - 36.0 gm/dl    PLATELET 128 (L) 140 - 450 1000/mm3    RDW-SD 58.6 (H) 35.1 - 43.9      NEUTROPHILS 86.7 (H) 34 - 64 %    LYMPHOCYTES 10.3 (L) 28 - 48 %    MONOCYTES 2.4 1 - 13 %   METABOLIC PANEL, COMPREHENSIVE    Collection Time: 01/10/19  5:11 PM   Result Value Ref Range    Sodium 138 136 - 145 mEq/L    Potassium 4.0 3.5 - 5.1 mEq/L    Chloride 107 98 - 107 mEq/L    CO2 22 21 - 32 mEq/L    Glucose  131 (H) 74 - 106 mg/dl    BUN 10 7 - 25 mg/dl    Creatinine 1.2 0.6 - 1.3 mg/dl    GFR est AA >60      GFR est non-AA >60      Calcium 8.2 (L) 8.5 - 10.1 mg/dl    AST (SGOT) 64 (H) 15 - 37 U/L    ALT (SGPT) 27 12 - 78 U/L    Alk. phosphatase 118 (H) 45 - 117 U/L    Bilirubin, total 0.6 0.2 - 1.0 mg/dl    Protein, total 7.2 6.4 - 8.2 gm/dl    Albumin 2.6 (L) 3.4 - 5.0 gm/dl    Anion gap 9 5 - 15 mmol/L   LACTIC  ACID    Collection Time: 01/10/19  7:25 PM   Result Value Ref Range    Lactic Acid 1.1 0.4 - 2.0 mmol/L   LACTIC ACID    Collection Time: 01/10/19 11:51 PM   Result Value Ref Range    Lactic Acid 1.7 0.4 - 2.0 mmol/L   CBC WITH AUTOMATED DIFF    Collection Time: 01/10/19 11:51 PM   Result Value Ref Range    WBC 2.7 (L) 4.0 - 11.0 1000/mm3    RBC 4.46 3.80 - 5.70 M/uL    HGB 10.1 (L) 12.4 - 17.2 gm/dl    HCT 33.1 (L) 37.0 - 50.0 %    MCV 74.2 (L) 80.0 - 98.0 fL    MCH 22.6 (L) 23.0 - 34.6 pg    MCHC 30.5 30.0 - 36.0 gm/dl    PLATELET 130 (L) 140 - 450 1000/mm3    RDW-SD 58.8 (H) 35.1 - 43.9      NEUTROPHILS 90.9 (H) 34 - 64 %    LYMPHOCYTES 8.0 (L) 28 - 48 %    MONOCYTES 1.1 1 - 13 %    Poikilocytosis OCCASIONAL      Anisocytosis 2+      Polychromasia 1+      Microcytes 2+      Elliptocytes OCCASIONAL      PLATELET COMMENTS DECREASED      Giant platelets OCCASIONAL     MAGNESIUM    Collection Time: 01/10/19 11:51 PM   Result Value Ref Range    Magnesium 1.9 1.6 - 2.6 mg/dl   METABOLIC PANEL, COMPREHENSIVE    Collection Time: 01/10/19 11:51 PM   Result Value Ref Range    Sodium 140 136 - 145 mEq/L    Potassium 3.9 3.5 - 5.1 mEq/L    Chloride 107 98 - 107 mEq/L    CO2 23 21 - 32 mEq/L    Glucose 147 (H) 74 - 106 mg/dl    BUN 12 7 - 25 mg/dl    Creatinine 1.2 0.6 - 1.3 mg/dl    GFR est AA >60      GFR est non-AA >60      Calcium 8.3 (L) 8.5 - 10.1 mg/dl    AST (SGOT) 54 (H) 15 - 37 U/L    ALT (SGPT) 24 12 - 78 U/L    Alk. phosphatase 114 45 - 117 U/L    Bilirubin, total 0.5 0.2 - 1.0 mg/dl    Protein, total 7.2 6.4 - 8.2 gm/dl    Albumin 2.5 (L) 3.4 - 5.0 gm/dl    Anion gap 9 5 - 15 mmol/L       No results found.    Active Problems:    Pneumonia due to COVID-19  virus (01/10/2019)      ____________________________________________________________________      Attending Physician: Stanton Kidney, MD       Dragon medical dictation software was used for portions of this report. Unintended voice recognition errors may  occur.

## 2019-01-11 NOTE — Progress Notes (Signed)
 Explained plasma consent form to patient. Patient said  I want to do anything to get better and consented to plasma. Consent signed and in chart.

## 2019-01-11 NOTE — Progress Notes (Signed)
Problem: Falls - Risk of  Goal: *Absence of Falls  Description: Document Tommy Brown Fall Risk and appropriate interventions in the flowsheet.  Outcome: Progressing Towards Goal  Note: Fall Risk Interventions:  Mobility Interventions: Bed/chair exit alarm         Medication Interventions: Bed/chair exit alarm, Patient to call before getting OOB    Elimination Interventions: Bed/chair exit alarm, Call light in reach              Problem: Patient Education: Go to Patient Education Activity  Goal: Patient/Family Education  Outcome: Progressing Towards Goal     Problem: Risk for Spread of Infection  Goal: Prevent transmission of infectious organism to others  Description: Prevent the transmission of infectious organisms to other patients, staff members, and visitors.  Outcome: Progressing Towards Goal

## 2019-01-12 ENCOUNTER — Inpatient Hospital Stay: Admit: 2019-01-12 | Payer: MEDICARE | Primary: Family Medicine

## 2019-01-12 LAB — POC BLOOD GAS + LACTIC ACID
BASE EXCESS: -2 mmol/L (ref <=3)
BASE EXCESS: -2 mmol/L (ref <=3)
BICARBONATE: 22.1 mmol/L (ref 18.0–26.0)
BICARBONATE: 22 mmol/L (ref 18.0–26.0)
Base Excess: -2 mmol/L (ref <=3)
Base Excess: -2 mmol/L (ref <=3)
CO2 Total: 23 mmol/L — ABNORMAL LOW (ref 24–29)
CO2 Total: 23 mmol/L — ABNORMAL LOW (ref 24–29)
CO2, TOTAL: 23 mmol/L — ABNORMAL LOW (ref 24–29)
CO2, TOTAL: 23 mmol/L — ABNORMAL LOW (ref 24–29)
FIO2: 80
FIO2: 80
HCO3: 22.1 mmol/L (ref 18.0–26.0)
HCO3: 22 mmol/L (ref 18.0–26.0)
LACTIC ACID: 1.41 mmol/L (ref 0.40–2.00)
LACTIC ACID: 1.06 mmol/L (ref 0.40–2.00)
LITERS PER MINUTE, LPM: 6
LITERS PER MINUTE, LPM: 15
Lactic Acid: 1.41 mmol/L (ref 0.40–2.00)
Lactic Acid: 1.06 mmol/L (ref 0.40–2.00)
Liters/min.: 6
Liters/min.: 15
O2 SAT: 89 % — ABNORMAL LOW (ref 90–100)
O2 SAT: 96 % (ref 90–100)
O2 Sat: 89 % — ABNORMAL LOW (ref 90–100)
O2 Sat: 96 % (ref 90–100)
PCO2: 32.5 mm Hg — ABNORMAL LOW (ref 35.0–45.0)
PCO2: 32.5 mm Hg — ABNORMAL LOW (ref 35.0–45.0)
PCO2: 30.3 mm Hg — ABNORMAL LOW (ref 35.0–45.0)
PCO2: 30.3 mm Hg — ABNORMAL LOW (ref 35.0–45.0)
PO2: 53 mm Hg — CL (ref 75–100)
PO2: 53 mm Hg — CL (ref 75–100)
PO2: 73 mm Hg — ABNORMAL LOW (ref 75–100)
PO2: 73 mm Hg — ABNORMAL LOW (ref 75–100)
Patient temp.: 98
Patient temp.: 98
Patient temperature: 98
Patient temperature: 98
pH: 7.44 (ref 7.350–7.450)
pH: 7.44 (ref 7.350–7.450)
pH: 7.468 — ABNORMAL HIGH (ref 7.350–7.450)
pH: 7.468 — ABNORMAL HIGH (ref 7.350–7.450)

## 2019-01-12 LAB — CBC WITH AUTOMATED DIFF
BASOPHILS: 0.2 % (ref 0–3)
EOSINOPHILS: 0 % (ref 0–5)
HCT: 32.1 % — ABNORMAL LOW (ref 37.0–50.0)
HGB: 9.6 gm/dl — ABNORMAL LOW (ref 12.4–17.2)
IMMATURE GRANULOCYTES: 0.4 % (ref 0.0–3.0)
LYMPHOCYTES: 11.9 % — ABNORMAL LOW (ref 28–48)
MCH: 22.5 pg — ABNORMAL LOW (ref 23.0–34.6)
MCHC: 29.9 gm/dl — ABNORMAL LOW (ref 30.0–36.0)
MCV: 75.4 fL — ABNORMAL LOW (ref 80.0–98.0)
MONOCYTES: 5 % (ref 1–13)
NEUTROPHILS: 82.5 % — ABNORMAL HIGH (ref 34–64)
NRBC: 0 (ref 0–0)
PLATELET: 138 10*3/uL — ABNORMAL LOW (ref 140–450)
RBC: 4.26 M/uL (ref 3.80–5.70)
RDW-SD: 59.5 — ABNORMAL HIGH (ref 35.1–43.9)
WBC: 4.8 10*3/uL (ref 4.0–11.0)

## 2019-01-12 LAB — METABOLIC PANEL, COMPREHENSIVE
ALT (SGPT): 35 U/L (ref 12–78)
AST (SGOT): 72 U/L — ABNORMAL HIGH (ref 15–37)
Albumin: 2.5 gm/dl — ABNORMAL LOW (ref 3.4–5.0)
Alk. phosphatase: 101 U/L (ref 45–117)
Anion gap: 6 mmol/L (ref 5–15)
BUN: 24 mg/dl (ref 7–25)
Bilirubin, total: 0.3 mg/dl (ref 0.2–1.0)
CO2: 25 mEq/L (ref 21–32)
Calcium: 8.6 mg/dl (ref 8.5–10.1)
Chloride: 111 mEq/L — ABNORMAL HIGH (ref 98–107)
Creatinine: 1.2 mg/dl (ref 0.6–1.3)
GFR est AA: >60
GFR est non-AA: >60
Glucose: 184 mg/dl — ABNORMAL HIGH (ref 74–106)
Potassium: 4.4 mEq/L (ref 3.5–5.1)
Protein, total: 6.9 gm/dl (ref 6.4–8.2)
Sodium: 142 mEq/L (ref 136–145)

## 2019-01-12 LAB — C REACTIVE PROTEIN, QT: C-Reactive protein: 81.2 mg/L — ABNORMAL HIGH (ref 0.0–2.9)

## 2019-01-12 LAB — TRIGLYCERIDE: Triglyceride: 105 mg/dL (ref 29–150)

## 2019-01-12 LAB — CONVALESCENT PLASMA, ALLOCATE
BLOOD TYPE CODE: 6200
Blood type code: 6200
ISSUE DATE/TIME: 202011161028
Issue date/time: 202011161028
SPECIMEN EXPIRATION DATE: 202011182359
STATUS INFO.: TRANSFUSED
Specimen expiration date: 202011182359
Status info.: TRANSFUSED

## 2019-01-12 LAB — D DIMER: D DIMER: 0.52 ug/mL (FEU) — ABNORMAL HIGH (ref 0.01–0.50)

## 2019-01-12 LAB — LACTIC ACID
LACTIC ACID: 1.7 mmol/L (ref 0.4–2.0)
Lactic Acid: 1.7 mmol/L (ref 0.4–2.0)

## 2019-01-12 LAB — MAGNESIUM
Magnesium: 2.1 mg/dL (ref 1.6–2.6)
Magnesium: 2.1 mg/dl (ref 1.6–2.6)

## 2019-01-12 LAB — FERRITIN
Ferritin: 190.8 ng/mL (ref 26.0–388.0)
Ferritin: 190.8 ng/ml (ref 26.0–388.0)

## 2019-01-12 LAB — PROCALCITONIN
PROCALCITONIN: 0.1 ng/ml (ref 0.00–0.50)
PROCALCITONIN: 0.1 ng/ml (ref 0.00–0.50)

## 2019-01-12 LAB — CULTURE, URINE
Isolate: 50000 — AB
Isolate: >100000 — AB

## 2019-01-12 LAB — FIBRINOGEN
Fibrinogen: 443 mg/dl — ABNORMAL HIGH (ref 220–397)
Fibrinogen: 443 mg/dl — ABNORMAL HIGH (ref 220–397)

## 2019-01-12 LAB — NT-PRO BNP: NT pro-BNP: 592 pg/ml — ABNORMAL HIGH (ref 0.0–450.0)

## 2019-01-12 LAB — TROPONIN I: Troponin-I: 0.035 ng/ml (ref 0.000–0.045)

## 2019-01-12 LAB — LD: LD: 337 U/L — ABNORMAL HIGH (ref 87–241)

## 2019-01-12 LAB — CBC WITH AUTO DIFFERENTIAL
Basophils %: 0.2 % (ref 0–3)
Eosinophils %: 0 % (ref 0–5)
Hematocrit: 32.1 % — ABNORMAL LOW (ref 37.0–50.0)
Hemoglobin: 9.6 gm/dl — ABNORMAL LOW (ref 12.4–17.2)
Immature Granulocytes: 0.4 % (ref 0.0–3.0)
Lymphocytes %: 11.9 % — ABNORMAL LOW (ref 28–48)
MCH: 22.5 pg — ABNORMAL LOW (ref 23.0–34.6)
MCHC: 29.9 gm/dl — ABNORMAL LOW (ref 30.0–36.0)
MCV: 75.4 fL — ABNORMAL LOW (ref 80.0–98.0)
Monocytes %: 5 % (ref 1–13)
Neutrophils %: 82.5 % — ABNORMAL HIGH (ref 34–64)
Nucleated RBCs: 0 (ref 0–0)
Platelets: 138 10*3/uL — ABNORMAL LOW (ref 140–450)
RBC: 4.26 M/uL (ref 3.80–5.70)
RDW-SD: 59.5 — ABNORMAL HIGH (ref 35.1–43.9)
WBC: 4.8 10*3/uL (ref 4.0–11.0)

## 2019-01-12 LAB — COMPREHENSIVE METABOLIC PANEL
ALT: 35 U/L (ref 12–78)
AST: 72 U/L — ABNORMAL HIGH (ref 15–37)
Albumin: 2.5 gm/dl — ABNORMAL LOW (ref 3.4–5.0)
Alkaline Phosphatase: 101 U/L (ref 45–117)
Anion Gap: 6 mmol/L (ref 5–15)
BUN: 24 mg/dl (ref 7–25)
CO2: 25 mEq/L (ref 21–32)
Calcium: 8.6 mg/dl (ref 8.5–10.1)
Chloride: 111 mEq/L — ABNORMAL HIGH (ref 98–107)
Creatinine: 1.2 mg/dl (ref 0.6–1.3)
EGFR IF NonAfrican American: >60
GFR African American: >60
Glucose: 184 mg/dl — ABNORMAL HIGH (ref 74–106)
Potassium: 4.4 mEq/L (ref 3.5–5.1)
Sodium: 142 mEq/L (ref 136–145)
Total Bilirubin: 0.3 mg/dl (ref 0.2–1.0)
Total Protein: 6.9 gm/dl (ref 6.4–8.2)

## 2019-01-12 LAB — D-DIMER, QUANTITATIVE: D-Dimer, Quant: 0.52 ug/mL (FEU) — ABNORMAL HIGH (ref 0.01–0.50)

## 2019-01-12 LAB — TROPONIN: Troponin I: 0.035 ng/ml (ref 0.000–0.045)

## 2019-01-12 LAB — TRIGLYCERIDES: Triglycerides: 105 mg/dl (ref 29–150)

## 2019-01-12 LAB — C-REACTIVE PROTEIN: CRP: 81.2 mg/L — ABNORMAL HIGH (ref 0.0–2.9)

## 2019-01-12 LAB — PROBNP, N-TERMINAL: BNP: 592 pg/ml — ABNORMAL HIGH (ref 0.0–450.0)

## 2019-01-12 LAB — LACTATE DEHYDROGENASE: LD: 337 U/L — ABNORMAL HIGH (ref 87–241)

## 2019-01-12 MED ORDER — POLYVINYL ALCOHOL-POVIDONE 1.4 %-0.6 % EYE DROPPERETTE
Freq: Once | OPHTHALMIC | Status: AC
Start: 2019-01-12 — End: 2019-01-11
  Administered 2019-01-12: 03:00:00 via OPHTHALMIC

## 2019-01-12 MED ORDER — PROMETHAZINE IN NS 25 MG/50 ML IV PIGGY BAG
25 mg/50 ml | Freq: Four times a day (QID) | INTRAVENOUS | Status: DC | PRN
Start: 2019-01-12 — End: 2019-01-20
  Administered 2019-01-12 – 2019-01-17 (×4): via INTRAVENOUS

## 2019-01-12 MED FILL — PROMETHAZINE IN NS 25 MG/50 ML IV PIGGY BAG: 25 mg/50 ml | INTRAVENOUS | Qty: 50

## 2019-01-12 MED FILL — DEXAMETHASONE SODIUM PHOSPHATE 4 MG/ML IJ SOLN: 4 mg/mL | INTRAMUSCULAR | Qty: 2

## 2019-01-12 MED FILL — TAMSULOSIN SR 0.4 MG 24 HR CAP: 0.4 mg | ORAL | Qty: 2

## 2019-01-12 MED FILL — POLYVINYL ALCOHOL-POVIDONE 1.4 %-0.6 % EYE DROPPERETTE: OPHTHALMIC | Qty: 1

## 2019-01-12 MED FILL — GABAPENTIN 300 MG CAP: 300 mg | ORAL | Qty: 2

## 2019-01-12 MED FILL — AMLODIPINE 5 MG TAB: 5 mg | ORAL | Qty: 2

## 2019-01-12 MED FILL — AZITHROMYCIN 500 MG IV SOLUTION: 500 mg | INTRAVENOUS | Qty: 5

## 2019-01-12 MED FILL — TEMAZEPAM 7.5 MG CAP: 7.5 mg | ORAL | Qty: 1

## 2019-01-12 MED FILL — CEFTRIAXONE 1 GRAM SOLUTION FOR INJECTION: 1 gram | INTRAMUSCULAR | Qty: 1

## 2019-01-12 MED FILL — ALLOPURINOL 300 MG TAB: 300 mg | ORAL | Qty: 1

## 2019-01-12 MED FILL — CREON 24,000-76,000-120,000 UNIT CAPSULE,DELAYED RELEASE: 24000-76000 -120,000 unit | ORAL | Qty: 1

## 2019-01-12 MED FILL — METOPROLOL SUCCINATE SR 25 MG 24 HR TAB: 25 mg | ORAL | Qty: 1

## 2019-01-12 MED FILL — FLORASTOR 250 MG CAPSULE: 250 mg | ORAL | Qty: 1

## 2019-01-12 MED FILL — KETOROLAC TROMETHAMINE 15 MG/ML INJECTION: 15 mg/mL | INTRAMUSCULAR | Qty: 1

## 2019-01-12 MED FILL — ENOXAPARIN 40 MG/0.4 ML SUB-Q SYRINGE: 40 mg/0.4 mL | SUBCUTANEOUS | Qty: 0.4

## 2019-01-12 MED FILL — REMDESIVIR 100 MG INTRAVENOUS POWDER FOR SOLUTION: 100 mg | INTRAVENOUS | Qty: 100

## 2019-01-12 NOTE — Progress Notes (Signed)
RRT Note:  RRT called for low O2 Sats. Sats reported at low to mid 80s. Patient is asymptomatic. No tachypnea or SOB. ABGs drawn, results as follows:  Results for STETSEN, NEPTUNE (MRN [id number]) as of 01/12/2019 04:44   Ref. Range 01/12/2019 04:17   pH Latest Ref Range: 7.350 - 7.450   7.440   PCO2 Latest Ref Range: 35.0 - 45.0 mm Hg 32.5 (L)   PO2 Latest Ref Range: 75 - 100 mm Hg 53.0 (LL)   BICARBONATE Latest Ref Range: 18.0 - 26.0 mmol/L 22.1   O2 SAT Latest Ref Range: 90 - 100 % 89.0 (L)   BASE EXCESS Latest Ref Range: -2 - 3 mmol/L -2   SITE Latest Units:   L Radial   Sample type Latest Units:   Art   ALLENS TEST Latest Units:   Pass   DEVICE Latest Units:   Nasal Can   Patient temp. Latest Units:   98.0 F   NOTIFIED Latest Units:   RN   POC BLOOD GAS + LACTIC ACID Unknown Rpt (A)   Patient is alert, oriented x 4, caring on a normal conversation, making jokes. Places on salter at [place]. Will repeat ABGs in 1 hour. Patient encouraged to lay prone for now until O2 Sats improved, patient agreed.     [id number] physicians on call paged.     [phone] Caffey returned page. Updated on patient status and plan. No new orders. Will repeat ABG at [phone]. Will follow up on patient later this morning.

## 2019-01-12 NOTE — Progress Notes (Signed)
Progress Note      Critical Care Note    DOS 01/12/2019     Patient: Tommy Brown               Sex: male          DOA: 01/10/2019       Date of Birth:  1945/11/27      Age:  73 y.o.        LOS:  LOS: 2 days       Assessment and Plan:      Acute Hypoxic Respiratory Failure     Pneumonia, Multifocal, suspect COVID-19 related    COVID-19 virus infection positive    Pancytopenia    HTN  CAD s/p CABG  Pancreatic Cancer  Gout   Neuropathy     Problem List:  Active Problems:    Pneumonia due to COVID-19 virus (01/10/2019)      PLAN:   Chart reviewed.  Patient seen and examined.  Nursing staff at bedside.  Check ABG.  Continue O2 per [place] to titrate to effect.  RT to repeat ABG on [place].  Continue with proning as tolerated.  Patient placed on continuous pulse oximetry.  Continue on telemetry monitoring. Consider transfer to stepdown unit if patient is not responding appropriately to above measures.  Continue dexamethasone and IV remdesivir.  Continue management as per primary team.  Further recommendations based on response to treatment and clinical course.          Subjective:     [name] is a 73 y.o. year old male who is being seen for RRT.  COVID-19 positive patient with multifocal pneumonia with oxygen saturations dropping into the low to mid 80s.  RRT called by nursing staff.    Objective:      Vital Signs:  Visit Vitals  BP 128/74 (BP Patient Position: Supine)   Pulse 81   Temp 98 ??F (36.7 ??C)   Resp 19   Wt 61.2 kg (135 lb)   SpO2 (P) 93%   BMI 20.53 kg/m??       Physical Exam:       HEENT: NC/AT, PERRLA, EOMI, dry mucous membranes  Neck: No JVD no bruits, supple, nontender, no significant LAD  LYMPH: No supraclavicular or cervical or axillary nodes on both sides  Cardiovascular: Heart, RRR, no M, R, G  Lungs: decreased breath sounds at bases, coarse upper airway sounds, no wheezes or crackles  Abd: Soft, non tender, not distended, No guarding, No rigidity, BS normal   NEURO:  Non focal, normal strength. CN II - XII intact bilaterally   Extrm: no leg edema   Skin: No rashes or lesions        Intake and Output:  Last three shifts:  11/15 0701 - 11/16 1900  In: 1320 [P.O.:320; I.V.:800]  Out: 2375 [Urine:2375]    Lab Results:  Recent Results (from the past 24 hour(s))   POC BLOOD GAS + LACTIC ACID    Collection Time: 01/12/19  4:17 AM   Result Value Ref Range    pH 7.440 7.350 - 7.450      PCO2 32.5 (L) 35.0 - 45.0 mm Hg    PO2 53.0 (LL) 75 - 100 mm Hg    BICARBONATE 22.1 18.0 - 26.0 mmol/L    O2 SAT 89.0 (L) 90 - 100 %    CO2, TOTAL 23.0 (L) 24 - 29 mmol/L    Lactic Acid 1.41 0.40 - 2.00 mmol/L  BASE EXCESS -2 -2 - 3 mmol/L    Patient temp. 98.0 F      Sample type Art      SITE L Radial      DEVICE Nasal Can      ALLENS TEST Pass      Liters/min. 6      NOTIFIED RN         Images:  @[email or web address] @    Medications:  Current Facility-Administered Medications   Medication Dose Route Frequency   ??? dextrose 5% and 0.9% NaCl infusion  75 mL/hr IntraVENous CONTINUOUS   ??? promethazine (PHENERGAN) 25 mg in NS IVPB  25 mg IntraVENous Q6H PRN   ??? allopurinoL (ZYLOPRIM) tablet 300 mg  300 mg Oral DAILY   ??? gabapentin (NEURONTIN) capsule 600 mg  600 mg Oral BID   ??? Saccharomyces boulardii (FLORASTOR) capsule 250 mg  250 mg Oral ACB&D   ??? lipase-protease-amylase (CREON 24,000) capsule 1 Cap  1 Cap Oral TID WITH MEALS   ??? metoprolol succinate (TOPROL-XL) XL tablet 25 mg  25 mg Oral DAILY   ??? polyvinyl alcohol-povidon(PF) (REFRESH CLASSIC) 1.4-0.6 % ophthalmic solution 2 Drop  2 Drop Both Eyes DAILY   ??? tamsulosin (FLOMAX) capsule 0.8 mg  0.8 mg Oral DAILY   ??? temazepam (RESTORIL) capsule 7.5 mg  7.5 mg Oral QHS PRN   ??? naloxone (NARCAN) injection 0.1 mg  0.1 mg IntraVENous PRN   ??? cefTRIAXone (ROCEPHIN) 1 g in 0.9% sodium chloride (MBP/ADV) 50 mL MBP  1 g IntraVENous Q24H   ??? ondansetron (ZOFRAN) injection 4 mg  4 mg IntraVENous Q4H PRN    ??? enoxaparin (LOVENOX) injection 40 mg  40 mg SubCUTAneous Q24H   ??? amLODIPine (NORVASC) tablet 10 mg  10 mg Oral DAILY   ??? azithromycin (ZITHROMAX) 500 mg in 0.9% sodium chloride 250 mL IVPB  500 mg IntraVENous Q24H   ??? hydrALAZINE (APRESOLINE) 20 mg/mL injection 10 mg  10 mg IntraVENous Q6H PRN   ??? 0.9% sodium chloride infusion 250 mL  250 mL IntraVENous PRN   ??? dexamethasone (DECADRON) 4 mg/mL injection 6 mg  6 mg IntraVENous Q24H   ??? remdesivir 100 mg in 0.9% sodium chloride 250 mL IVPB  100 mg IntraVENous QHS   ??? ketorolac (TORADOL) injection 15 mg  15 mg IntraVENous Q8H PRN     Critical care time: 45 minutes    Knox Saliva, MD  January 12, 2019  4:28 AM

## 2019-01-12 NOTE — Progress Notes (Signed)
Case Manager assessment completed. Patient DC plan is home, as of this time patient is on high flow 02, will monitor for home 02 use.

## 2019-01-12 NOTE — Progress Notes (Addendum)
Patient admitted on 01/10/2019 from home with   Chief Complaint   Patient presents with   ??? Flu Like Symptoms   ??? Concern For COVID-19 (Coronavirus)          The patient is being treated for    PMH:   Past Medical History:   Diagnosis Date   ??? Arthritis    ??? Benign prostate hyperplasia    ??? Bladder infection    ??? Chronic kidney disease    ??? Coronary arteriosclerosis    ??? Coronary atherosclerosis of artery bypass graft    ??? Decreased testosterone level    ??? Diverticular disease    ??? Essential hypertension    ??? Gout    ??? History of acute renal failure    ??? History of anemia    ??? History of malignant neoplasm of pancreas    ??? Hx of CABG    ??? Kidney stone    ??? Kidney stones    ??? Neuropathy    ??? Pancreatic cancer ([place])    ??? Sepsis ([place])    ??? Tremor         Treatment Team: Treatment Team: Attending Provider: Stanton Kidney, MD; Consulting Provider: Stanton Kidney, MD; Care Manager: Malachi Carl; Primary Nurse: Neal Dy, RN; Consulting Provider: Donavan Foil, MD      The patient has been admitted to the hospital 3 times in the past 12 months.    Previous 4 Admission Dates Admission and Discharge Diagnosis Interventions Barriers Disposition   5/20 to 07/17/18       4/27/ to 5//120       01/17/18 to 01/21/2018                Patient and Family/Caregivers Goals of Care: to feel better     Caregivers Participating in Plan of [place] with the patient: patient, case manager     Tentative dc plan: home     Anticipated DME needs for discharge: TBD    PRESCREENING COMPLETED FOR SNF n/a      Does the patient have appropriate clothing available to be worn at discharge? Yes , family to provide       The patient and care participants are willing to travel n/a  area for discharge facility.   The patient and plan of care participants have been provided with a list of all available Rehab Facilities or [place] agencies as applicable. CM will follow up with a list of facilities or agencies that are offer acceptance.    CM has disclosed any financial interest that [facility] may have with any facility or agency.    Anticipated Discharge Date: 01/18/2019    The plan of care and discharge plan has been discussed with Delene Loll, Tami Ribas, MD and all other appropriate providers and adjusted per interdisciplinary team recommendations and in discussion with the patient and the patient designated Care Plan Participants.    Barriers to Healthcare Success/ Readmission Risk Factors: multiple admits,     Consults:  Palliative Care Consult Recommended: n/a  Transitional Care Clinic Referral: n/a   Transitional Nurse Navigator Referral: n/a  Oncology Navigator Referral: n/a  SW consulted: yes  [facility] Consulted: n/a  Outside Hospital/Community Resources Referrals and Collaboration: n/a    Food/Nutrition Needs:   n/a                   Dietician Consulted: n/a    RRAT Score: Medium Risk  14       Total Score        3 Has Seen PCP in Last 6 Months (Yes=3, No=0)    2 Married. Living with Significant Other. Assisted Living. LTAC. SNF. or   Rehab    4 IP Visits Last 12 Months (1-3=4, 4=9, >4=11)    5 Pt. Coverage (Medicare=5 , Medicaid, or Self-Pay=4)        Criteria that do not apply:    Patient Length of Stay (>5 days = 3)    Charlson Comorbidity Score (Age + Comorbid Conditions)           PCP: Daralene Milch, MD . How do you get to your doctor appointments? self    Specialists: n/a    Dialysis Unit: n/a     Ph[facility] [place] . Are there any medications that you have trouble paying for? No  Any difficulty getting your medications? No     DME available at [place]: n/a       Home O2 Provider: [place] and Prior Level of Function: Lives at [address]  [place] [address] @HOMEPHONE @ [phone]. Lives with spouse   1 story.  0 Steps into home.  Responsibilities at home include independent with ADL's    Prior to admission open services: none    [place] n/a     Extended Emergency Contact Information  Primary Emergency Contact: [place]  Mobile Phone: [phone]  Relation: None     Transportation:spouse will transport home    Therapy Recommendations:    OT = n/a     PT = n/a     SLP =  N/a      RT Home O2 Evaluation =  Will monitor for home 02 use     Wound Care =  n/a    Case Management Assessment    ABUSE/NEGLECT SCREENING   Physical Abuse/Neglect: Denies   Sexual Abuse: Denies   Sexual Abuse: Denies   Other Abuse/Issues: Denies          PRIMARY DECISION MAKER        self                           CARE MANAGEMENT INTERVENTIONS   Readmission Interview Completed: Not Applicable   PCP Verified by CM: Yes(Dr [place])           Mode of Transport at Discharge: Other (see comment)(family)       Transition of Care Consult (CM Consult): Discharge Planning               Discharge Durable Medical Equipment: No   Physical Therapy Consult: No   Occupational Therapy Consult: No   Speech Therapy Consult: No   Current Support Network: Lives with Spouse   Reason for Referral: DCP Rounds   History Provided By: Patient   Patient Orientation: Alert and Oriented, Person, Place, Situation, Self   Cognition: Alert   Support System Response: Cooperative   Previous Living Arrangement: Lives with Family Independent   Home Accessibility: Steps(no)   Prior Functional Level: Independent in ADLs/IADLs   Current Functional Level: Independent in ADLs/IADLs       Can patient return to prior living arrangement: Yes   Ability to make needs known:: Good   Family able to  assist with home care needs:: Yes                    Anticipated Discharge Needs: DME(may need home 02)                      DISCHARGE LOCATION   Discharge Placement: Home

## 2019-01-12 NOTE — Progress Notes (Addendum)
Medical Progress Note      NAME: Tommy Brown   DOB:  [birth year]  MRN:             [id number]    Date:  01/12/2019        Assessment:     1. Acute hypoxic respiratory failure  2. COVID-19 infection  3. Multifocal pneumonia  4. Pancytopenia  5. UTI with Klebsiella pneumoniae  6. Hypertension  7. CAD status post CABG  8. Gout  9. Pancreatic cancer  10. Neuropathy    Plan:     ?? Patient with hypoxic last night.  Continue supplemental oxygen.  Currently he is on high flow O2.  Try to wean him off as tolerated   ?? Consulted pulmonary and [name]   ?? Patient received convalescent plasma   ?? Continue remdesivir for total 5 days   ?? Continue dexamethasone 6 mg daily for total 10 days   ?? Currently on antibiotics with Zithromax and ceftriaxone   ?? We will continue monitor inflammatory markers   ?? Discussed with the patient and updated his wife    Subjective:     Shortness of breathing    Objective:     Vitals:    Last 24hrs VS reviewed since prior progress note. Most recent are:  Visit Vitals  BP 123/68 (BP Patient Position: Supine)   Pulse 67   Temp 97.7 ??F (36.5 ??C)   Resp 17   Wt 61.2 kg (135 lb)   SpO2 93%   BMI 20.53 kg/m??     SpO2 Readings from Last 6 Encounters:   01/12/19 93%   07/31/18 98%   07/17/18 99%   06/26/18 97%   01/21/18 96%    O2 Flow Rate (L/min): 15 l/min       Intake/Output Summary (Last 24 hours) at 01/12/2019 1312  Last data filed at 01/12/2019 1242  Gross per 24 hour   Intake 2227.5 ml   Output 1275 ml   Net 952.5 ml        Exam:     General:   Not in acute distress  HEENT: PERRLA, Neck Supple,  No JVD  Respiratory:   CTA bilaterally-no wheezes, rales, rhonchi, or crackles  Cardiac:  Regular Rate and Rythmn  - no murmurs, rubs or gallops  Abdominal:  Soft, non-tender, non-distended, positive bowel sounds  Extremities:  No cyanosis, or edema.  Skin: No rash  Neurological:  No focal neurological deficits    Medication:   Current Medications Reviewed     Current Facility-Administered Medications:   ???  dextrose 5% and 0.9% NaCl infusion, 75 mL/hr, IntraVENous, CONTINUOUS, Stanton Kidney, MD, Last Rate: 75 mL/hr at 01/12/19 0933, 75 mL/hr at 01/12/19 0933  ???  promethazine (PHENERGAN) 25 mg in NS IVPB, 25 mg, IntraVENous, Q6H PRN, Stanton Kidney, MD, Last Rate: 200 mL/hr at 01/11/19 1955, 25 mg at 01/11/19 1955  ???  allopurinoL (ZYLOPRIM) tablet 300 mg, 300 mg, Oral, DAILY, Stanton Kidney, MD, 300 mg at 01/12/19 4782  ???  gabapentin (NEURONTIN) capsule 600 mg, 600 mg, Oral, BID, Coreena Rubalcava, Tami Ribas, MD, 600 mg at 01/12/19 0951  ???  Saccharomyces boulardii (FLORASTOR) capsule 250 mg, 250 mg, Oral, ACB&D, Stanton Kidney, MD, 250 mg at 01/12/19 9562  ???  lipase-protease-amylase (CREON 24,000) capsule 1 Cap, 1 Cap, Oral, TID WITH MEALS, Stanton Kidney, MD, 1 Cap at 01/12/19 1303  ???  metoprolol succinate (TOPROL-XL) XL tablet 25 mg,  25 mg, Oral, DAILY, Stanton Kidney, MD, 25 mg at 01/12/19 0865  ???  polyvinyl alcohol-povidon(PF) (REFRESH CLASSIC) 1.4-0.6 % ophthalmic solution 2 Drop, 2 Drop, Both Eyes, DAILY, Sugar Vanzandt, Tami Ribas, MD, 2 Drop at 01/12/19 [phone]  ???  tamsulosin (FLOMAX) capsule 0.8 mg, 0.8 mg, Oral, DAILY, Laurella Tull, Tami Ribas, MD, 0.8 mg at 01/12/19 9629  ???  temazepam (RESTORIL) capsule 7.5 mg, 7.5 mg, Oral, QHS PRN, Stanton Kidney, MD, 7.5 mg at 01/11/19 2250  ???  naloxone (NARCAN) injection 0.1 mg, 0.1 mg, IntraVENous, PRN, Delene Loll, Tami Ribas, MD  ???  cefTRIAXone (ROCEPHIN) 1 g in 0.9% sodium chloride (MBP/ADV) 50 mL MBP, 1 g, IntraVENous, Q24H, Valeria Boza, Tami Ribas, MD, Last Rate: 100 mL/hr at 01/11/19 1757, 1 g at 01/11/19 1757  ???  ondansetron (ZOFRAN) injection 4 mg, 4 mg, IntraVENous, Q4H PRN, Delene Loll, Tami Ribas, MD  ???  enoxaparin (LOVENOX) injection 40 mg, 40 mg, SubCUTAneous, Q24H, Rhylin Venters, Tami Ribas, MD, 40 mg at 01/11/19 1757  ???  amLODIPine (NORVASC) tablet 10 mg, 10 mg, Oral, DAILY, Sherian Valenza, Tami Ribas, MD, 10 mg at 01/12/19 5284   ???  azithromycin (ZITHROMAX) 500 mg in 0.9% sodium chloride 250 mL IVPB, 500 mg, IntraVENous, Q24H, Anayia Eugene, Tami Ribas, MD, Last Rate: 250 mL/hr at 01/11/19 2003, 500 mg at 01/11/19 2003  ???  hydrALAZINE (APRESOLINE) 20 mg/mL injection 10 mg, 10 mg, IntraVENous, Q6H PRN, Alyviah Crandle, Tami Ribas, MD  ???  0.9% sodium chloride infusion 250 mL, 250 mL, IntraVENous, PRN, Delene Loll, Tami Ribas, MD  ???  dexamethasone (DECADRON) 4 mg/mL injection 6 mg, 6 mg, IntraVENous, Q24H, Lelani Garnett, Tami Ribas, MD, 6 mg at 01/11/19 1756  ???  [COMPLETED] remdesivir 200 mg in 0.9% sodium chloride 250 mL IVPB, 200 mg, IntraVENous, ONCE, Stopped at 01/10/19 1855 **FOLLOWED BY** remdesivir 100 mg in 0.9% sodium chloride 250 mL IVPB, 100 mg, IntraVENous, QHS, Fredrich Cory, Tami Ribas, MD, 100 mg at 01/11/19 2250  ???  ketorolac (TORADOL) injection 15 mg, 15 mg, IntraVENous, Q8H PRN, Stanton Kidney, MD, 15 mg at 01/12/19 1324      Lab:     Lab Data Reviewed: (see below)  Recent Results (from the past 24 hour(s))   CBC WITH AUTOMATED DIFF    Collection Time: 01/12/19  3:21 AM   Result Value Ref Range    WBC 4.8 4.0 - 11.0 1000/mm3    RBC 4.26 3.80 - 5.70 M/uL    HGB 9.6 (L) 12.4 - 17.2 gm/dl    HCT 32.1 (L) 37.0 - 50.0 %    MCV 75.4 (L) 80.0 - 98.0 fL    MCH 22.5 (L) 23.0 - 34.6 pg    MCHC 29.9 (L) 30.0 - 36.0 gm/dl    PLATELET 138 (L) 140 - 450 1000/mm3    RDW-SD 59.5 (H) 35.1 - 43.9      NRBC 0 0 - 0      IMMATURE GRANULOCYTES 0.4 0.0 - 3.0 %    NEUTROPHILS 82.5 (H) 34 - 64 %    LYMPHOCYTES 11.9 (L) 28 - 48 %    MONOCYTES 5.0 1 - 13 %    EOSINOPHILS 0.0 0 - 5 %    BASOPHILS 0.2 0 - 3 %   METABOLIC PANEL, COMPREHENSIVE    Collection Time: 01/12/19  3:21 AM   Result Value Ref Range    Sodium 142 136 - 145 mEq/L    Potassium 4.4 3.5 - 5.1 mEq/L    Chloride 111 (H)  98 - 107 mEq/L    CO2 25 21 - 32 mEq/L    Glucose 184 (H) 74 - 106 mg/dl    BUN 24 7 - 25 mg/dl    Creatinine 1.2 0.6 - 1.3 mg/dl    GFR est AA >60      GFR est non-AA >60      Calcium 8.6 8.5 - 10.1 mg/dl     AST (SGOT) 72 (H) 15 - 37 U/L    ALT (SGPT) 35 12 - 78 U/L    Alk. phosphatase 101 45 - 117 U/L    Bilirubin, total 0.3 0.2 - 1.0 mg/dl    Protein, total 6.9 6.4 - 8.2 gm/dl    Albumin 2.5 (L) 3.4 - 5.0 gm/dl    Anion gap 6 5 - 15 mmol/L   PROCALCITONIN    Collection Time: 01/12/19  3:21 AM   Result Value Ref Range    PROCALCITONIN 0.10 0.00 - 0.50 ng/ml   C REACTIVE PROTEIN, QT    Collection Time: 01/12/19  3:21 AM   Result Value Ref Range    C-Reactive protein 81.2 (H) 0.0 - 2.9 mg/L   LACTIC ACID    Collection Time: 01/12/19  3:21 AM   Result Value Ref Range    Lactic Acid 1.7 0.4 - 2.0 mmol/L   FERRITIN    Collection Time: 01/12/19  3:21 AM   Result Value Ref Range    Ferritin 190.8 26.0 - 388.0 ng/ml   LD    Collection Time: 01/12/19  3:21 AM   Result Value Ref Range    LD 337 (H) 87 - 241 U/L   TRIGLYCERIDE    Collection Time: 01/12/19  3:21 AM   Result Value Ref Range    Triglyceride 105 29 - 150 mg/dl   FIBRINOGEN    Collection Time: 01/12/19  3:21 AM   Result Value Ref Range    Fibrinogen 443 (H) 220 - 397 mg/dl   D DIMER    Collection Time: 01/12/19  3:21 AM   Result Value Ref Range    D DIMER 0.52 (H) 0.01 - 0.50 ug/mL (FEU)   TROPONIN I    Collection Time: 01/12/19  3:21 AM   Result Value Ref Range    Troponin-I 0.035 0.000 - 0.045 ng/ml   MAGNESIUM    Collection Time: 01/12/19  3:21 AM   Result Value Ref Range    Magnesium 2.1 1.6 - 2.6 mg/dl   NT-PRO BNP    Collection Time: 01/12/19  3:21 AM   Result Value Ref Range    NT pro-BNP 592.0 (H) 0.0 - 450.0 pg/ml   POC BLOOD GAS + LACTIC ACID    Collection Time: 01/12/19  4:17 AM   Result Value Ref Range    pH 7.440 7.350 - 7.450      PCO2 32.5 (L) 35.0 - 45.0 mm Hg    PO2 53.0 (LL) 75 - 100 mm Hg    BICARBONATE 22.1 18.0 - 26.0 mmol/L    O2 SAT 89.0 (L) 90 - 100 %    CO2, TOTAL 23.0 (L) 24 - 29 mmol/L    Lactic Acid 1.41 0.40 - 2.00 mmol/L    BASE EXCESS -2 -2 - 3 mmol/L    Patient temp. 98.0 F      Sample type Art      SITE L Radial       DEVICE Nasal Can      ALLENS TEST Pass  Liters/min. 6      NOTIFIED RN     POC BLOOD GAS + LACTIC ACID    Collection Time: 01/12/19  5:27 AM   Result Value Ref Range    pH 7.468 (H) 7.350 - 7.450      PCO2 30.3 (L) 35.0 - 45.0 mm Hg    PO2 73.0 (L) 75 - 100 mm Hg    BICARBONATE 22.0 18.0 - 26.0 mmol/L    O2 SAT 96.0 90 - 100 %    CO2, TOTAL 23.0 (L) 24 - 29 mmol/L    Lactic Acid 1.06 0.40 - 2.00 mmol/L    BASE EXCESS -2 -2 - 3 mmol/L    Patient temp. 98.0 F      Sample type Art      FIO2 80      SITE L Radial      DEVICE HFNC      ALLENS TEST Pass      Liters/min. 15      NOTIFIED RN         Xr Chest Sngl V    Result Date: 01/12/2019  INDICATION: hypoxia, multifocal pneumonia, covid-19 positive. Dyspnea COMPARISON: 01/10/2019 TECHNIQUE: Single view chest.     IMPRESSION: The patient is status post sternotomy. Heart is mildly enlarged. Mild vascular congestion. There are bilateral infiltrates in the mid to lower lung zones, increased since prior radiograph.       Active Problems:    Pneumonia due to COVID-19 virus (01/10/2019)      ____________________________________________________________________      Attending Physician: Stanton Kidney, MD       Dragon medical dictation software was used for portions of this report. Unintended voice recognition errors may occur.

## 2019-01-12 NOTE — Progress Notes (Signed)
Problem: Falls - Risk of  Goal: *Absence of Falls  Description: Document Tommy Brown Fall Risk and appropriate interventions in the flowsheet.  Outcome: Progressing Towards Goal  Note: Fall Risk Interventions:  Mobility Interventions: Bed/chair exit alarm, Patient to call before getting OOB         Medication Interventions: Bed/chair exit alarm, Teach patient to arise slowly, Patient to call before getting OOB    Elimination Interventions: Bed/chair exit alarm, Call light in reach, Urinal in reach, Toileting schedule/hourly rounds       Problem: Risk for Spread of Infection  Goal: Prevent transmission of infectious organism to others  Description: Prevent the transmission of infectious organisms to other patients, staff members, and visitors.  Outcome: Progressing Towards Goal     Problem: Gas Exchange - Impaired  Goal: *Absence of hypoxia  Outcome: Progressing Towards Goal     Problem: Patient Education: Go to Patient Education Activity  Goal: Patient/Family Education  Outcome: Progressing Towards Goal

## 2019-01-12 NOTE — Consults (Signed)
[facility]       Pulmonary Consult Note    Patient: Tommy Brown               Sex: male          DOA: 01/10/2019       Date of Birth:  April 02, 1945      Age:  73 y.o.        LOS:  LOS: 2 days        Reason for Consult: Worsening hypoxemia in the setting of COVID-19 pneumonitis    IMPRESSION:   ?? Acute hypoxemic respiratory failure due to COVID-19 pneumonitis  ?? COVID-19 pneumonitis  ?? Urine culture growing Klebsiella sensitive to cephalosporin  ?? Hypertension  ?? BPH  ?? History of pancreatic cancer   RECOMMENDATIONS:   ?? Continue supplemental oxygen through high flow nasal cannula and/or Salters nasal cannula.  Titrate, and target oxygen saturation greater than 88%.  ?? Encourage proning and deep breathing exercise.  ?? Continue remdesivir for 5 days.  Continue dexamethasone 6 mg IV daily for total 10 days.  S/p convalescent plasma.  ?? Continue azithromycin and ceftriaxone for 5 days.  Obtain sputum Gram stain and culture if possible.  ?? Monitor inflammatory markers.  Monitor D-dimer level.  ?? Continue subcu Lovenox for DVT prophylaxis  ?? Will defer respective systems problem management to primary and other consultant and follow patient with primary and other team  ?? Further recommendations will be based on the patient's response to recommended treatment and results of the investigation ordered.     HPI:    [name] is a 73 y.o. male who carries known history of coronary artery disease status post CABG, pancreatic cancer, and BPH presented to [facility] on 01/10/2019 with fever, and chills.  At the time of presentation he denied any shortness of breath and/or other associated pulmonary symptoms.  He was tested positive for COVID-19 infection.  He received convalescent plasma.  And currently receiving dexamethasone and remdesivir.  Since hospitalization there has been gradual increase in patient's oxygen requirement.  He is currently on oxygen through Salters nasal cannula to maintain oxygen saturation greater than 88%.    Review of Systems  Review of Systems   Constitutional: Positive for fever and malaise/fatigue.   HENT: Negative.    Eyes: Negative.    Respiratory: Positive for shortness of breath.    Cardiovascular: Negative.    Gastrointestinal: Negative.    Genitourinary: Negative.    Musculoskeletal: Negative.    Skin: Negative.    Neurological: Negative.    Endo/Heme/Allergies: Negative.    Psychiatric/Behavioral: Negative.          Past Medical History  Past Medical History:   Diagnosis Date   ??? Arthritis    ??? Benign prostate hyperplasia    ??? Bladder infection    ??? Chronic kidney disease    ??? Coronary arteriosclerosis    ??? Coronary atherosclerosis of artery bypass graft    ??? Decreased testosterone level    ??? Diverticular disease    ??? Essential hypertension    ??? Gout    ??? History of acute renal failure    ??? History of anemia    ??? History of malignant neoplasm of pancreas    ??? Hx of CABG    ??? Kidney stone    ??? Kidney stones    ??? Neuropathy    ??? Pancreatic cancer ([place])    ???  Sepsis ([place])    ??? Tremor        Past Surgical History  Past Surgical History:   Procedure Laterality Date   ??? HX CERVICAL FUSION     ??? HX COLONOSCOPY     ??? HX CORONARY ARTERY BYPASS GRAFT  1995   ??? HX HEART CATHETERIZATION  2006   ??? HX ORTHOPAEDIC      back   ??? HX OTHER SURGICAL  2006    whipple procedure done for pancreatic cancer    ??? HX UROLOGICAL  2012    Percutaneous extraction of a kidney stone w/ fragmentation procedure        Family History  Family History   Problem Relation Age of Onset   ??? Alcohol abuse Father    ??? Heart Disease Brother    ??? Emphysema Brother        Social History  Social History     Tobacco Use   ??? Smoking status: Never Smoker   ??? Smokeless tobacco: Never Used   Substance Use Topics   ??? Alcohol use: Not Currently   ??? Drug use: Not Currently        Medications  Current Facility-Administered Medications   Medication Dose Route Frequency   ??? dextrose 5% and 0.9% NaCl infusion  75 mL/hr IntraVENous CONTINUOUS   ??? promethazine (PHENERGAN) 25 mg in NS IVPB  25 mg IntraVENous Q6H PRN   ??? allopurinoL (ZYLOPRIM) tablet 300 mg  300 mg Oral DAILY   ??? gabapentin (NEURONTIN) capsule 600 mg  600 mg Oral BID   ??? Saccharomyces boulardii (FLORASTOR) capsule 250 mg  250 mg Oral ACB&D   ??? lipase-protease-amylase (CREON 24,000) capsule 1 Cap  1 Cap Oral TID WITH MEALS   ??? metoprolol succinate (TOPROL-XL) XL tablet 25 mg  25 mg Oral DAILY   ??? polyvinyl alcohol-povidon(PF) (REFRESH CLASSIC) 1.4-0.6 % ophthalmic solution 2 Drop  2 Drop Both Eyes DAILY   ??? tamsulosin (FLOMAX) capsule 0.8 mg  0.8 mg Oral DAILY   ??? temazepam (RESTORIL) capsule 7.5 mg  7.5 mg Oral QHS PRN   ??? naloxone (NARCAN) injection 0.1 mg  0.1 mg IntraVENous PRN   ??? cefTRIAXone (ROCEPHIN) 1 g in 0.9% sodium chloride (MBP/ADV) 50 mL MBP  1 g IntraVENous Q24H   ??? ondansetron (ZOFRAN) injection 4 mg  4 mg IntraVENous Q4H PRN   ??? enoxaparin (LOVENOX) injection 40 mg  40 mg SubCUTAneous Q24H   ??? amLODIPine (NORVASC) tablet 10 mg  10 mg Oral DAILY   ??? azithromycin (ZITHROMAX) 500 mg in 0.9% sodium chloride 250 mL IVPB  500 mg IntraVENous Q24H   ??? hydrALAZINE (APRESOLINE) 20 mg/mL injection 10 mg  10 mg IntraVENous Q6H PRN   ??? 0.9% sodium chloride infusion 250 mL  250 mL IntraVENous PRN   ??? dexamethasone (DECADRON) 4 mg/mL injection 6 mg  6 mg IntraVENous Q24H    ??? remdesivir 100 mg in 0.9% sodium chloride 250 mL IVPB  100 mg IntraVENous QHS   ??? ketorolac (TORADOL) injection 15 mg  15 mg IntraVENous Q8H PRN     Prior to Admission medications    Medication Sig Start Date End Date Taking? Authorizing Provider   metoprolol succinate (TOPROL-XL) 25 mg XL tablet TAKE 1 TABLET BY MOUTH EVERY DAY 07/21/18  Yes Provider, Historical   tamsulosin (Flomax) 0.4 mg capsule Take 2 Caps by mouth daily. 07/17/18  Yes Kristen Loader, MD   gabapentin (NEURONTIN) 600 mg tablet Take  600 mg by mouth two (2) times a day.   Yes Other, Phys, MD   amLODIPine (NORVASC) 5 mg tablet Take 5 mg by mouth daily.   Yes Other, Phys, MD   allopurinol (ZYLOPRIM) 300 mg tablet Take  by mouth daily.   Yes Other, Phys, MD   aspirin 81 mg chewable tablet Take 81 mg by mouth daily.   Yes Other, Phys, MD   testosterone (ANDROGEL) 20.25 mg/1.25 gram (1.62 %) gel Apply 20 mg to affected area daily. Max Daily Amount: 20 mg. 12/21/18   Other, Phys, MD   bimatoprost (Lumigan) 0.01 % ophthalmic drops Lumigan 0.01 % eye drops    Provider, Historical   promethazine (PHENERGAN) 12.5 mg tablet promethazine 12.5 mg tablet   TAKE 1 TABLET BY MOUTH 2 TIMES PER DAY AS NEEDED    Provider, Historical   testosterone cypionate (DEPOTESTOTERONE CYPIONATE) 200 mg/mL injection INJECT 0.5 MILLILITER INTO THE MUSCLE EVERY 2 WEEKS DISCARD VIAL AFTER USE 11/16/18   Provider, Historical   ketorolac (TORADOL) 10 mg tablet Take 1 Tab by mouth three (3) times daily as needed for Pain. 08/06/18   Retia Passe, MD   lipase-protease-amylase (Creon) 24,000-76,000 -120,000 unit capsule Take  by mouth.    Provider, Historical   lipase-protease-amylase (Creon) 24,000-76,000 -120,000 unit capsule Take  by mouth daily as needed (with snacks and meals).    Provider, Historical   temazepam (RESTORIL) 7.5 mg capsule Take  by mouth nightly.    Provider, Historical    lactobacillus rhamnosus gg 10 billion cell (Culturelle) 10 billion cell capsule Take 1 Cap by mouth Before breakfast and dinner. 07/17/18   Kristen Loader, MD   peg 400-propylene glycol (Systane, propylene glycol,) 0.4-0.3 % drop Administer 2 Drops to both eyes daily. Patient places two drops in both eyes every AM.   Indications: dry eye    Provider, Historical   metoprolol tartrate (LOPRESSOR) 25 mg tablet Take 25 mg by mouth daily.    Other, Phys, MD   TESTOSTERONE IM by IntraMUSCular route.    Other, Phys, MD       Allergy  Allergies   Allergen Reactions   ??? Other Food Other (comments)     VISTAID  ANESTHESIA   ??? Codeine Itching and Swelling   ??? Oxycodone Anaphylaxis   ??? Hydroxyzine Pamoate Itching and Other (comments)     Other reaction(s): Unknown     ??? Meperidine Itching, Palpitations, Swelling and Other (comments)     Other reaction(s): Unknown     ??? Metoclopramide Itching, Other (comments) and Swelling     Other reaction(s): Other (See Comments)  Reaction unknown   Reaction unknown      ??? Morphine Other (comments) and Nausea and Vomiting   ??? Oxycodone-Acetaminophen Itching   ??? Prochlorperazine Itching, Other (comments) and Unknown (comments)     Other reaction(s): Unknown  Unsure of reaction     ??? Hydroxyzine Hcl Hives       Physical Exam:   Vital Signs:    Blood pressure 138/73, pulse 69, temperature 97.8 ??F (36.6 ??C), resp. rate 18, weight 61.2 kg (135 lb), SpO2 90 %. Body mass index is 20.53 kg/m??.    O2 Device: Hi flow nasal cannula, Humidifier(salter)   O2 Flow Rate (L/min): 15 l/min   Temp (24hrs), Avg:98 ??F (36.7 ??C), Min:97.7 ??F (36.5 ??C), Max:98.2 ??F (36.8 ??C)       Intake/Output:   Last shift:      11/17 0701 -  11/17 1900  In: -   Out: 400 [Urine:400]  Last 3 shifts: 11/15 1901 - 11/17 0700  In: 1936.3 [P.O.:320; I.V.:1416.3]  Out: 1825 [Urine:1825]    Intake/Output Summary (Last 24 hours) at 01/12/2019 0950  Last data filed at 01/12/2019 1914  Gross per 24 hour   Intake 1816.25 ml   Output 1275 ml    Net 541.25 ml      Physical Exam  Vitals signs and nursing note reviewed.   HENT:      Mouth/Throat:      Mouth: Mucous membranes are moist.   Eyes:      Pupils: Pupils are equal, round, and reactive to light.   Cardiovascular:      Rate and Rhythm: Regular rhythm.      Heart sounds: Murmur present.   Pulmonary:      Breath sounds: No wheezing.   Abdominal:      General: Bowel sounds are normal.      Palpations: Abdomen is soft.   Musculoskeletal:         General: No swelling.   Skin:     General: Skin is warm and dry.   Neurological:      General: No focal deficit present.      Mental Status: He is alert and oriented to person, place, and time.             Labs Reviewed:  Recent Results (from the past 24 hour(s))   CBC WITH AUTOMATED DIFF    Collection Time: 01/12/19  3:21 AM   Result Value Ref Range    WBC 4.8 4.0 - 11.0 1000/mm3    RBC 4.26 3.80 - 5.70 M/uL    HGB 9.6 (L) 12.4 - 17.2 gm/dl    HCT 32.1 (L) 37.0 - 50.0 %    MCV 75.4 (L) 80.0 - 98.0 fL    MCH 22.5 (L) 23.0 - 34.6 pg    MCHC 29.9 (L) 30.0 - 36.0 gm/dl    PLATELET 138 (L) 140 - 450 1000/mm3    RDW-SD 59.5 (H) 35.1 - 43.9      NRBC 0 0 - 0      IMMATURE GRANULOCYTES 0.4 0.0 - 3.0 %    NEUTROPHILS 82.5 (H) 34 - 64 %    LYMPHOCYTES 11.9 (L) 28 - 48 %    MONOCYTES 5.0 1 - 13 %    EOSINOPHILS 0.0 0 - 5 %    BASOPHILS 0.2 0 - 3 %   METABOLIC PANEL, COMPREHENSIVE    Collection Time: 01/12/19  3:21 AM   Result Value Ref Range    Sodium 142 136 - 145 mEq/L    Potassium 4.4 3.5 - 5.1 mEq/L    Chloride 111 (H) 98 - 107 mEq/L    CO2 25 21 - 32 mEq/L    Glucose 184 (H) 74 - 106 mg/dl    BUN 24 7 - 25 mg/dl    Creatinine 1.2 0.6 - 1.3 mg/dl    GFR est AA >60      GFR est non-AA >60      Calcium 8.6 8.5 - 10.1 mg/dl    AST (SGOT) 72 (H) 15 - 37 U/L    ALT (SGPT) 35 12 - 78 U/L    Alk. phosphatase 101 45 - 117 U/L    Bilirubin, total 0.3 0.2 - 1.0 mg/dl    Protein, total 6.9 6.4 - 8.2 gm/dl    Albumin 2.5 (L) 3.4 -  5.0 gm/dl    Anion gap 6 5 - 15 mmol/L    PROCALCITONIN    Collection Time: 01/12/19  3:21 AM   Result Value Ref Range    PROCALCITONIN 0.10 0.00 - 0.50 ng/ml   C REACTIVE PROTEIN, QT    Collection Time: 01/12/19  3:21 AM   Result Value Ref Range    C-Reactive protein 81.2 (H) 0.0 - 2.9 mg/L   LACTIC ACID    Collection Time: 01/12/19  3:21 AM   Result Value Ref Range    Lactic Acid 1.7 0.4 - 2.0 mmol/L   FERRITIN    Collection Time: 01/12/19  3:21 AM   Result Value Ref Range    Ferritin 190.8 26.0 - 388.0 ng/ml   LD    Collection Time: 01/12/19  3:21 AM   Result Value Ref Range    LD 337 (H) 87 - 241 U/L   TRIGLYCERIDE    Collection Time: 01/12/19  3:21 AM   Result Value Ref Range    Triglyceride 105 29 - 150 mg/dl   FIBRINOGEN    Collection Time: 01/12/19  3:21 AM   Result Value Ref Range    Fibrinogen 443 (H) 220 - 397 mg/dl   D DIMER    Collection Time: 01/12/19  3:21 AM   Result Value Ref Range    D DIMER 0.52 (H) 0.01 - 0.50 ug/mL (FEU)   TROPONIN I    Collection Time: 01/12/19  3:21 AM   Result Value Ref Range    Troponin-I 0.035 0.000 - 0.045 ng/ml   MAGNESIUM    Collection Time: 01/12/19  3:21 AM   Result Value Ref Range    Magnesium 2.1 1.6 - 2.6 mg/dl   POC BLOOD GAS + LACTIC ACID    Collection Time: 01/12/19  4:17 AM   Result Value Ref Range    pH 7.440 7.350 - 7.450      PCO2 32.5 (L) 35.0 - 45.0 mm Hg    PO2 53.0 (LL) 75 - 100 mm Hg    BICARBONATE 22.1 18.0 - 26.0 mmol/L    O2 SAT 89.0 (L) 90 - 100 %    CO2, TOTAL 23.0 (L) 24 - 29 mmol/L    Lactic Acid 1.41 0.40 - 2.00 mmol/L    BASE EXCESS -2 -2 - 3 mmol/L    Patient temp. 98.0 F      Sample type Art      SITE L Radial      DEVICE Nasal Can      ALLENS TEST Pass      Liters/min. 6      NOTIFIED RN     POC BLOOD GAS + LACTIC ACID    Collection Time: 01/12/19  5:27 AM   Result Value Ref Range    pH 7.468 (H) 7.350 - 7.450      PCO2 30.3 (L) 35.0 - 45.0 mm Hg    PO2 73.0 (L) 75 - 100 mm Hg    BICARBONATE 22.0 18.0 - 26.0 mmol/L    O2 SAT 96.0 90 - 100 %    CO2, TOTAL 23.0 (L) 24 - 29 mmol/L     Lactic Acid 1.06 0.40 - 2.00 mmol/L    BASE EXCESS -2 -2 - 3 mmol/L    Patient temp. 98.0 F      Sample type Art      FIO2 80      SITE L Radial  DEVICE HFNC      ALLENS TEST Pass      Liters/min. 15      NOTIFIED RN             No results for input(s): FIO2I, IFO2, HCO3I, IHCO3, [place], PCO2I, PCOPOC, IPHI, PHI, PHPOC, PO2I, PO2POC in the last 72 hours.    No lab exists for component: IPOC2    All Micro Results     Procedure Component Value Units Date/Time    CULTURE, URINE [[id number]]  (Abnormal)  (Susceptibility) Collected:  01/10/19 1305    Order Status:  Completed Specimen:  Urine from Clean catch Updated:  01/12/19 0856     Isolate --        >100,000 CFU/mL  Klebsiella pneumoniae ssp pneumoniae       Isolate --        50,000 CFU/mL  Gram Negative Bacilli Isolated  Identification And Susceptibility To Follow      CULTURE, BLOOD [[id number]] Collected:  01/10/19 1147    Order Status:  Completed Specimen:  Blood Updated:  01/12/19 0709     Blood Culture Result No Growth At 24 Hours       CULTURE, BLOOD [[id number]] Collected:  01/10/19 1130    Order Status:  Completed Specimen:  Blood Updated:  01/12/19 0709     Blood Culture Result No Growth At 24 Hours       LEGIONELLA PNEUMOPHILA AG, URINE [[id number]] Collected:  01/10/19 1305    Order Status:  Completed Specimen:  Urine Updated:  01/10/19 1439     Legionella Ag, urine NEGATIVE        Comment: Presumptive negative for L. pneumophila serogroup 1 antigen in urine, suggesting no recent or  current infection. Infection due to Legionella cannot be ruled out since other serogroups and  species may cause disease, antigen may not be present in urine in early infection and the level  of antigen present in the urine may be below the detection limit of the test.         S.Judie Petit, UR/CSF [[id number]]  (Abnormal) Collected:  01/10/19 1305    Order Status:  Completed Specimen:  Urine Updated:  01/10/19 1438     Strep pneumo Ag, urine POSITIVE        SARS-COV-2_FLU [[id number]]  (Abnormal) Collected:  01/10/19 1050    Order Status:  Completed Specimen:  NASOPHARYNGEAL SWAB Updated:  01/10/19 1230     COVID-19 POSITIVE        Comment: Negative results do not preclude SARS-CoV-2 infection and should not be used as the sole basis  for treatment or other patient management decisions. Negative results must be combined with  clinical observations, patient history, and epidemiological information.  Testing with the Xpert Xpress SARS-CoV-2 test is intended for use by trained operators who are  proficient in performing tests using either GeneXpert Dx, GeneXpert Infinity and/or [employer]. The Xpert Xpress SARS-CoV-2 test is only for use under the Food and Drug  Administration's Emergency Use Authorization.  THE  pos sars cov2_  RESULT WAS CALLED TO (AND READ BACK BY) MEGAN, ER _.             THE NOTIFICATION WAS MADE ON _11/15/20   BY _BD         CULTURE, RESPIRATORY/SPUTUM/BRONCH Sid Falcon STAIN [[id number]]     Order Status:  No result Specimen:  Sputum  Imaging:  '[x]'$ I have personally reviewed the patient???s chest radiographs images and report with the patient  Results from Hospital Encounter encounter on 01/10/19   XR CHEST SNGL V    Narrative EXAM:  Chest AP    INDICATIONS: sepsis     COMPARISON: Most recently 07/15/2018.    FINDINGS:     Multiple bilateral airspace opacities, new from the previous study. No pleural  effusion or pneumothorax.    Top normal heart size. Median sternotomy wires and changes post-CABG.      Impression IMPRESSION:   Multiple patchy bilateral airspace opacities, concerning for multifocal  pneumonia.         Results from [facility] encounter on 06/22/18   CT ABD PELV WO CONT    Narrative CT Abdomen and Pelvis without    Indication: Left hydronephrosis. Evaluate for obstructing stone or lesion.    Comparison: Ultrasound 06/22/2018. CT 05/21/2018.    TECHNIQUE:    CT of the abdomen and pelvis WITHOUT intravenous contrast. Coronal and sagittal  reformations were obtained.        All CT exams at this facility use one or more dose reduction techniques  including automatic exposure control, mA/kV adjustment per patient's size, or  iterative reconstruction technique. DICOM format imaged data is available to  non-affiliated external healthcare facilities or entities on a secure, media  free, reciprocal searchable basis with patient authorization for 12 months  following the date of the study.    DISCUSSION:    ABSENCE OF INTRAVENOUS CONTRAST DECREASES SENSITIVITY FOR DETECTION OF FOCAL  LESIONS AND VASCULAR PATHOLOGY.    LOWER THORAX: Coronary artery calcifications.    HEPATOBILIARY: Multiple subcentimeter foci of hypoattenuation, too small to  characterize but unchanged. Cholecystectomy. Left intrahepatic pneumobilia,  unchanged.  SPLEEN: No splenomegaly.  PANCREAS: Postoperative changes from prior Whipple procedure. No mass or duct  dilatation in the residual pancreatic tail.    ADRENALS: No adrenal nodules.  KIDNEYS/URETERS: Multiple bilateral renal cysts. Multiple small (2 to 4 mm)  nonobstructing bilateral renal calculi. There is a 10 x 9 mm nonobstructing  calculus in the lower pole of the left kidney. Moderate left hydronephrosis and  hydroureter. There is a 8 x 7 mm obstructing calculus in the proximal left  ureter. No right hydronephrosis. No suspicious renal lesions.  PELVIC ORGANS/BLADDER: Enlarged prostate. Penile implant. The bladder is  unremarkable.    PERITONEUM / RETROPERITONEUM: No free air or fluid.  LYMPH NODES: No lymphadenopathy.  VESSELS: Extensive atherosclerotic vascular calcifications. Infrarenal abdominal  aorta ectasia.    GI TRACT: No distention or wall thickening. Diverticulosis. Normal appendix.    BONES AND SOFT TISSUES: Posterior lumbar fixation at L4-5. Mild retrolisthesis  at L2-3 and L3-4. Moderate to advanced disc height loss at L2-3. Mild   spondylosis of the remaining lumbar spine.      Impression IMPRESSION:    1.  Obstructing 8 mm calculus in the left proximal ureter causing moderate left  hydronephrosis.  2.  Multiple additional nonobstructing bilateral renal calculi.  3.  Multiple bilateral renal cysts.  4.  Diverticulosis.  5.  Postoperative changes from prior Whipple procedure.  6.  Multiple hypoattenuating liver lesions, unchanged.          '[x]'$ See my orders for details    My assessment, plan of care, findings, medications, side effects etc were discussed with:  '[x]'$ nursing '[]'$ PT/OT    '[x]'$ respiratory therapy '[]'$ Dr.   '[]'$ family '[]'$ Patient     '[x]'$ Total care time exclusive of procedures  35 minutes with complex decision making performed and > 50% time spent in face to face consultation.    Donavan Foil, MD

## 2019-01-12 NOTE — Progress Notes (Signed)
Patient's abg results below. Patient is currently on 15 lpm HFNC via Salter.    Results for Tommy Brown, Tommy Brown (MRN [id number]) as of 01/12/2019 06:27   Ref. Range 01/12/2019 05:27   pH Latest Ref Range: 7.350 - 7.450   7.468 (H)   PCO2 Latest Ref Range: 35.0 - 45.0 mm Hg 30.3 (L)   PO2 Latest Ref Range: 75 - 100 mm Hg 73.0 (L)   BICARBONATE Latest Ref Range: 18.0 - 26.0 mmol/L 22.0   O2 SAT Latest Ref Range: 90 - 100 % 96.0

## 2019-01-12 NOTE — Progress Notes (Signed)
Patient without distress at this time. Pt states he is feeling fine at this time. Patient encouraged to prone.

## 2019-01-12 NOTE — Other (Signed)
Bedside and Verbal shift change report given to Neal Dy, RN   (oncoming nurse) by Juliette Mangle, LPN (offgoing nurse). Report included the following information SBAR, MAR, Recent Results and Cardiac Rhythm NSR.

## 2019-01-12 NOTE — Other (Signed)
Bedside and Verbal shift change report given to Kelly Everest RN (oncoming nurse) by Deneshea RN (offgoing nurse). Report included the following information SBAR and Kardex.

## 2019-01-12 NOTE — Progress Notes (Signed)
Medical Progress Note      NAME: Tommy Brown   DOB:  1946-01-15  MRN:             [id number]    Date:  01/12/2019        Assessment:     1. Acute hypoxic respiratory failure  2. COVID-19 infection  3. Multifocal pneumonia  4. Pancytopenia  5. UTI with Klebsiella pneumoniae  6. Hypertension  7. CAD status post CABG  8. Gout  9. Pancreatic cancer  10. Neuropathy    Plan:     ?? Patient with hypoxic last night.  Continue supplemental oxygen.  Currently he is on high flow O2.  Try to wean him off as tolerated   ?? Consulted pulmonary and [name]   ?? Patient received convalescent plasma   ?? Continue remdesivir for total 5 days   ?? Continue dexamethasone 6 mg daily for total 10 days   ?? Currently on antibiotics with Zithromax and ceftriaxone   ?? We will continue monitor inflammatory markers   ?? Discussed with the patient and updated his wife    Subjective:     Shortness of breathing    Objective:     Vitals:    Last 24hrs VS reviewed since prior progress note. Most recent are:  Visit Vitals  BP 123/68 (BP Patient Position: Supine)   Pulse 67   Temp 97.7 ??F (36.5 ??C)   Resp 17   Wt 61.2 kg (135 lb)   SpO2 93%   BMI 20.53 kg/m??     SpO2 Readings from Last 6 Encounters:   01/12/19 93%   07/31/18 98%   07/17/18 99%   06/26/18 97%   01/21/18 96%    O2 Flow Rate (L/min): 15 l/min       Intake/Output Summary (Last 24 hours) at 01/12/2019 1312  Last data filed at 01/12/2019 1242  Gross per 24 hour   Intake 2227.5 ml   Output 1275 ml   Net 952.5 ml        Exam:     General:   Not in acute distress  HEENT: PERRLA, Neck Supple,  No JVD  Respiratory:   CTA bilaterally-no wheezes, rales, rhonchi, or crackles  Cardiac:  Regular Rate and Rythmn  - no murmurs, rubs or gallops  Abdominal:  Soft, non-tender, non-distended, positive bowel sounds  Extremities:  No cyanosis, or edema.  Skin: No rash  Neurological:  No focal neurological deficits    Medication:   Current Medications Reviewed    Current  Facility-Administered Medications:   ???  dextrose 5% and 0.9% NaCl infusion, 75 mL/hr, IntraVENous, CONTINUOUS, Stanton Kidney, MD, Last Rate: 75 mL/hr at 01/12/19 0933, 75 mL/hr at 01/12/19 0933  ???  promethazine (PHENERGAN) 25 mg in NS IVPB, 25 mg, IntraVENous, Q6H PRN, Stanton Kidney, MD, Last Rate: 200 mL/hr at 01/11/19 1955, 25 mg at 01/11/19 1955  ???  allopurinoL (ZYLOPRIM) tablet 300 mg, 300 mg, Oral, DAILY, Stanton Kidney, MD, 300 mg at 01/12/19 2595  ???  gabapentin (NEURONTIN) capsule 600 mg, 600 mg, Oral, BID, Katelyn Kohlmeyer, Tami Ribas, MD, 600 mg at 01/12/19 0951  ???  Saccharomyces boulardii (FLORASTOR) capsule 250 mg, 250 mg, Oral, ACB&D, Stanton Kidney, MD, 250 mg at 01/12/19 6387  ???  lipase-protease-amylase (CREON 24,000) capsule 1 Cap, 1 Cap, Oral, TID WITH MEALS, Stanton Kidney, MD, 1 Cap at 01/12/19 1303  ???  metoprolol succinate (TOPROL-XL) XL tablet 25 mg,  25 mg, Oral, DAILY, Stanton Kidney, MD, 25 mg at 01/12/19 2376  ???  polyvinyl alcohol-povidon(PF) (REFRESH CLASSIC) 1.4-0.6 % ophthalmic solution 2 Drop, 2 Drop, Both Eyes, DAILY, Krikor Willet, Tami Ribas, MD, 2 Drop at 01/12/19 [phone]  ???  tamsulosin (FLOMAX) capsule 0.8 mg, 0.8 mg, Oral, DAILY, Betzayda Braxton, Tami Ribas, MD, 0.8 mg at 01/12/19 5176  ???  temazepam (RESTORIL) capsule 7.5 mg, 7.5 mg, Oral, QHS PRN, Stanton Kidney, MD, 7.5 mg at 01/11/19 2250  ???  naloxone (NARCAN) injection 0.1 mg, 0.1 mg, IntraVENous, PRN, Delene Loll, Tami Ribas, MD  ???  cefTRIAXone (ROCEPHIN) 1 g in 0.9% sodium chloride (MBP/ADV) 50 mL MBP, 1 g, IntraVENous, Q24H, Davia Smyre, Tami Ribas, MD, Last Rate: 100 mL/hr at 01/11/19 1757, 1 g at 01/11/19 1757  ???  ondansetron (ZOFRAN) injection 4 mg, 4 mg, IntraVENous, Q4H PRN, Delene Loll, Tami Ribas, MD  ???  enoxaparin (LOVENOX) injection 40 mg, 40 mg, SubCUTAneous, Q24H, Joreen Swearingin, Tami Ribas, MD, 40 mg at 01/11/19 1757  ???  amLODIPine (NORVASC) tablet 10 mg, 10 mg, Oral, DAILY, Javohn Basey, Tami Ribas, MD, 10 mg at 01/12/19 1607  ???  azithromycin (ZITHROMAX) 500 mg in 0.9% sodium chloride  250 mL IVPB, 500 mg, IntraVENous, Q24H, Lytle Malburg, Tami Ribas, MD, Last Rate: 250 mL/hr at 01/11/19 2003, 500 mg at 01/11/19 2003  ???  hydrALAZINE (APRESOLINE) 20 mg/mL injection 10 mg, 10 mg, IntraVENous, Q6H PRN, Avianah Pellman, Tami Ribas, MD  ???  0.9% sodium chloride infusion 250 mL, 250 mL, IntraVENous, PRN, Delene Loll, Tami Ribas, MD  ???  dexamethasone (DECADRON) 4 mg/mL injection 6 mg, 6 mg, IntraVENous, Q24H, Suzanne Garbers, Tami Ribas, MD, 6 mg at 01/11/19 1756  ???  [COMPLETED] remdesivir 200 mg in 0.9% sodium chloride 250 mL IVPB, 200 mg, IntraVENous, ONCE, Stopped at 01/10/19 1855 **FOLLOWED BY** remdesivir 100 mg in 0.9% sodium chloride 250 mL IVPB, 100 mg, IntraVENous, QHS, Ethal Gotay, Tami Ribas, MD, 100 mg at 01/11/19 2250  ???  ketorolac (TORADOL) injection 15 mg, 15 mg, IntraVENous, Q8H PRN, Stanton Kidney, MD, 15 mg at 01/12/19 3710      Lab:     Lab Data Reviewed: (see below)  Recent Results (from the past 24 hour(s))   CBC WITH AUTOMATED DIFF    Collection Time: 01/12/19  3:21 AM   Result Value Ref Range    WBC 4.8 4.0 - 11.0 1000/mm3    RBC 4.26 3.80 - 5.70 M/uL    HGB 9.6 (L) 12.4 - 17.2 gm/dl    HCT 32.1 (L) 37.0 - 50.0 %    MCV 75.4 (L) 80.0 - 98.0 fL    MCH 22.5 (L) 23.0 - 34.6 pg    MCHC 29.9 (L) 30.0 - 36.0 gm/dl    PLATELET 138 (L) 140 - 450 1000/mm3    RDW-SD 59.5 (H) 35.1 - 43.9      NRBC 0 0 - 0      IMMATURE GRANULOCYTES 0.4 0.0 - 3.0 %    NEUTROPHILS 82.5 (H) 34 - 64 %    LYMPHOCYTES 11.9 (L) 28 - 48 %    MONOCYTES 5.0 1 - 13 %    EOSINOPHILS 0.0 0 - 5 %    BASOPHILS 0.2 0 - 3 %   METABOLIC PANEL, COMPREHENSIVE    Collection Time: 01/12/19  3:21 AM   Result Value Ref Range    Sodium 142 136 - 145 mEq/L    Potassium 4.4 3.5 - 5.1 mEq/L    Chloride 111 (H)  98 - 107 mEq/L    CO2 25 21 - 32 mEq/L    Glucose 184 (H) 74 - 106 mg/dl    BUN 24 7 - 25 mg/dl    Creatinine 1.2 0.6 - 1.3 mg/dl    GFR est AA >60      GFR est non-AA >60      Calcium 8.6 8.5 - 10.1 mg/dl    AST (SGOT) 72 (H) 15 - 37 U/L    ALT (SGPT) 35 12 - 78 U/L    Alk.  phosphatase 101 45 - 117 U/L    Bilirubin, total 0.3 0.2 - 1.0 mg/dl    Protein, total 6.9 6.4 - 8.2 gm/dl    Albumin 2.5 (L) 3.4 - 5.0 gm/dl    Anion gap 6 5 - 15 mmol/L   PROCALCITONIN    Collection Time: 01/12/19  3:21 AM   Result Value Ref Range    PROCALCITONIN 0.10 0.00 - 0.50 ng/ml   C REACTIVE PROTEIN, QT    Collection Time: 01/12/19  3:21 AM   Result Value Ref Range    C-Reactive protein 81.2 (H) 0.0 - 2.9 mg/L   LACTIC ACID    Collection Time: 01/12/19  3:21 AM   Result Value Ref Range    Lactic Acid 1.7 0.4 - 2.0 mmol/L   FERRITIN    Collection Time: 01/12/19  3:21 AM   Result Value Ref Range    Ferritin 190.8 26.0 - 388.0 ng/ml   LD    Collection Time: 01/12/19  3:21 AM   Result Value Ref Range    LD 337 (H) 87 - 241 U/L   TRIGLYCERIDE    Collection Time: 01/12/19  3:21 AM   Result Value Ref Range    Triglyceride 105 29 - 150 mg/dl   FIBRINOGEN    Collection Time: 01/12/19  3:21 AM   Result Value Ref Range    Fibrinogen 443 (H) 220 - 397 mg/dl   D DIMER    Collection Time: 01/12/19  3:21 AM   Result Value Ref Range    D DIMER 0.52 (H) 0.01 - 0.50 ug/mL (FEU)   TROPONIN I    Collection Time: 01/12/19  3:21 AM   Result Value Ref Range    Troponin-I 0.035 0.000 - 0.045 ng/ml   MAGNESIUM    Collection Time: 01/12/19  3:21 AM   Result Value Ref Range    Magnesium 2.1 1.6 - 2.6 mg/dl   NT-PRO BNP    Collection Time: 01/12/19  3:21 AM   Result Value Ref Range    NT pro-BNP 592.0 (H) 0.0 - 450.0 pg/ml   POC BLOOD GAS + LACTIC ACID    Collection Time: 01/12/19  4:17 AM   Result Value Ref Range    pH 7.440 7.350 - 7.450      PCO2 32.5 (L) 35.0 - 45.0 mm Hg    PO2 53.0 (LL) 75 - 100 mm Hg    BICARBONATE 22.1 18.0 - 26.0 mmol/L    O2 SAT 89.0 (L) 90 - 100 %    CO2, TOTAL 23.0 (L) 24 - 29 mmol/L    Lactic Acid 1.41 0.40 - 2.00 mmol/L    BASE EXCESS -2 -2 - 3 mmol/L    Patient temp. 98.0 F      Sample type Art      SITE L Radial      DEVICE Nasal Can      ALLENS TEST Pass  Liters/min. 6      NOTIFIED RN     POC  BLOOD GAS + LACTIC ACID    Collection Time: 01/12/19  5:27 AM   Result Value Ref Range    pH 7.468 (H) 7.350 - 7.450      PCO2 30.3 (L) 35.0 - 45.0 mm Hg    PO2 73.0 (L) 75 - 100 mm Hg    BICARBONATE 22.0 18.0 - 26.0 mmol/L    O2 SAT 96.0 90 - 100 %    CO2, TOTAL 23.0 (L) 24 - 29 mmol/L    Lactic Acid 1.06 0.40 - 2.00 mmol/L    BASE EXCESS -2 -2 - 3 mmol/L    Patient temp. 98.0 F      Sample type Art      FIO2 80      SITE L Radial      DEVICE HFNC      ALLENS TEST Pass      Liters/min. 15      NOTIFIED RN         Xr Chest Sngl V    Result Date: 01/12/2019  INDICATION: hypoxia, multifocal pneumonia, covid-19 positive. Dyspnea COMPARISON: 01/10/2019 TECHNIQUE: Single view chest.     IMPRESSION: The patient is status post sternotomy. Heart is mildly enlarged. Mild vascular congestion. There are bilateral infiltrates in the mid to lower lung zones, increased since prior radiograph.       Active Problems:    Pneumonia due to COVID-19 virus (01/10/2019)      ____________________________________________________________________      Attending Physician: Stanton Kidney, MD       Dragon medical dictation software was used for portions of this report. Unintended voice recognition errors may occur.

## 2019-01-12 NOTE — Progress Notes (Signed)
RRT Note:  RRT called for low O2 Sats. Sats reported at low to mid 80s. Patient is asymptomatic. No tachypnea or SOB. ABGs drawn, results as follows:  Results for Tommy Brown, Tommy Brown (MRN [id number]) as of 01/12/2019 04:44   Ref. Range 01/12/2019 04:17   pH Latest Ref Range: 7.350 - 7.450   7.440   PCO2 Latest Ref Range: 35.0 - 45.0 mm Hg 32.5 (L)   PO2 Latest Ref Range: 75 - 100 mm Hg 53.0 (LL)   BICARBONATE Latest Ref Range: 18.0 - 26.0 mmol/L 22.1   O2 SAT Latest Ref Range: 90 - 100 % 89.0 (L)   BASE EXCESS Latest Ref Range: -2 - 3 mmol/L -2   SITE Latest Units:   L Radial   Sample type Latest Units:   Art   ALLENS TEST Latest Units:   Pass   DEVICE Latest Units:   Nasal Can   Patient temp. Latest Units:   98.0 F   NOTIFIED Latest Units:   RN   POC BLOOD GAS + LACTIC ACID Unknown Rpt (A)   Patient is alert, oriented x 4, caring on a normal conversation, making jokes. Places on salter at 15L. Will repeat ABGs in 1 hour. Patient encouraged to lay prone for now until O2 Sats improved, patient agreed.     0420-[facility] on call paged.     0423-DrLangston Masker returned page. Updated on patient status and plan. No new orders. Will repeat ABG at [phone]. Will follow up on patient later this morning.

## 2019-01-12 NOTE — Progress Notes (Signed)
Progress Note      Critical Care Note    DOS 01/12/2019     Patient: Tommy Brown               Sex: male          DOA: 01/10/2019       Date of Birth:  12/26/1945      Age:  73 y.o.        LOS:  LOS: 2 days       Assessment and Plan:      Acute Hypoxic Respiratory Failure     Pneumonia, Multifocal, suspect COVID-19 related    COVID-19 virus infection positive    Pancytopenia    HTN  CAD s/p CABG  Pancreatic Cancer  Gout   Neuropathy     Problem List:  Active Problems:    Pneumonia due to COVID-19 virus (01/10/2019)      PLAN:   Chart reviewed.  Patient seen and examined.  Nursing staff at bedside.  Check ABG.  Continue O2 per [place] to titrate to effect.  RT to repeat ABG on [place].  Continue with proning as tolerated.  Patient placed on continuous pulse oximetry.  Continue on telemetry monitoring. Consider transfer to stepdown unit if patient is not responding appropriately to above measures.  Continue dexamethasone and IV remdesivir.  Continue management as per primary team.  Further recommendations based on response to treatment and clinical course.          Subjective:     [name] is a 73 y.o. year old male who is being seen for RRT.  COVID-19 positive patient with multifocal pneumonia with oxygen saturations dropping into the low to mid 80s.  RRT called by nursing staff.    Objective:      Vital Signs:  Visit Vitals  BP 128/74 (BP Patient Position: Supine)   Pulse 81   Temp 98 ??F (36.7 ??C)   Resp 19   Wt 61.2 kg (135 lb)   SpO2 (P) 93%   BMI 20.53 kg/m??       Physical Exam:       HEENT: NC/AT, PERRLA, EOMI, dry mucous membranes  Neck: No JVD no bruits, supple, nontender, no significant LAD  LYMPH: No supraclavicular or cervical or axillary nodes on both sides  Cardiovascular: Heart, RRR, no M, R, G  Lungs: decreased breath sounds at bases, coarse upper airway sounds, no wheezes or crackles  Abd: Soft, non tender, not distended, No guarding, No rigidity, BS normal  NEURO:  Non focal, normal strength.  CN II - XII intact bilaterally   Extrm: no leg edema   Skin: No rashes or lesions        Intake and Output:  Last three shifts:  11/15 0701 - 11/16 1900  In: 1320 [P.O.:320; I.V.:800]  Out: 2375 [Urine:2375]    Lab Results:  Recent Results (from the past 24 hour(s))   POC BLOOD GAS + LACTIC ACID    Collection Time: 01/12/19  4:17 AM   Result Value Ref Range    pH 7.440 7.350 - 7.450      PCO2 32.5 (L) 35.0 - 45.0 mm Hg    PO2 53.0 (LL) 75 - 100 mm Hg    BICARBONATE 22.1 18.0 - 26.0 mmol/L    O2 SAT 89.0 (L) 90 - 100 %    CO2, TOTAL 23.0 (L) 24 - 29 mmol/L    Lactic Acid 1.41 0.40 - 2.00 mmol/L  BASE EXCESS -2 -2 - 3 mmol/L    Patient temp. 98.0 F      Sample type Art      SITE L Radial      DEVICE Nasal Can      ALLENS TEST Pass      Liters/min. 6      NOTIFIED RN         Images:  @[email or web address] @    Medications:  Current Facility-Administered Medications   Medication Dose Route Frequency   ??? dextrose 5% and 0.9% NaCl infusion  75 mL/hr IntraVENous CONTINUOUS   ??? promethazine (PHENERGAN) 25 mg in NS IVPB  25 mg IntraVENous Q6H PRN   ??? allopurinoL (ZYLOPRIM) tablet 300 mg  300 mg Oral DAILY   ??? gabapentin (NEURONTIN) capsule 600 mg  600 mg Oral BID   ??? Saccharomyces boulardii (FLORASTOR) capsule 250 mg  250 mg Oral ACB&D   ??? lipase-protease-amylase (CREON 24,000) capsule 1 Cap  1 Cap Oral TID WITH MEALS   ??? metoprolol succinate (TOPROL-XL) XL tablet 25 mg  25 mg Oral DAILY   ??? polyvinyl alcohol-povidon(PF) (REFRESH CLASSIC) 1.4-0.6 % ophthalmic solution 2 Drop  2 Drop Both Eyes DAILY   ??? tamsulosin (FLOMAX) capsule 0.8 mg  0.8 mg Oral DAILY   ??? temazepam (RESTORIL) capsule 7.5 mg  7.5 mg Oral QHS PRN   ??? naloxone (NARCAN) injection 0.1 mg  0.1 mg IntraVENous PRN   ??? cefTRIAXone (ROCEPHIN) 1 g in 0.9% sodium chloride (MBP/ADV) 50 mL MBP  1 g IntraVENous Q24H   ??? ondansetron (ZOFRAN) injection 4 mg  4 mg IntraVENous Q4H PRN   ??? enoxaparin (LOVENOX) injection 40 mg  40 mg SubCUTAneous Q24H   ??? amLODIPine (NORVASC)  tablet 10 mg  10 mg Oral DAILY   ??? azithromycin (ZITHROMAX) 500 mg in 0.9% sodium chloride 250 mL IVPB  500 mg IntraVENous Q24H   ??? hydrALAZINE (APRESOLINE) 20 mg/mL injection 10 mg  10 mg IntraVENous Q6H PRN   ??? 0.9% sodium chloride infusion 250 mL  250 mL IntraVENous PRN   ??? dexamethasone (DECADRON) 4 mg/mL injection 6 mg  6 mg IntraVENous Q24H   ??? remdesivir 100 mg in 0.9% sodium chloride 250 mL IVPB  100 mg IntraVENous QHS   ??? ketorolac (TORADOL) injection 15 mg  15 mg IntraVENous Q8H PRN     Critical care time: 45 minutes    Knox Saliva, MD  January 12, 2019  4:28 AM

## 2019-01-12 NOTE — Progress Notes (Signed)
 Patient admitted on 01/10/2019 from home with   Chief Complaint   Patient presents with   . Flu Like Symptoms   . Concern For COVID-19 (Coronavirus)          The patient is being treated for    PMH:   Past Medical History:   Diagnosis Date   . Arthritis    . Benign prostate hyperplasia    . Bladder infection    . Chronic kidney disease    . Coronary arteriosclerosis    . Coronary atherosclerosis of artery bypass graft    . Decreased testosterone level    . Diverticular disease    . Essential hypertension    . Gout    . History of acute renal failure    . History of anemia    . History of malignant neoplasm of pancreas    . Hx of CABG    . Kidney stone    . Kidney stones    . Neuropathy    . Pancreatic cancer (HCC)    . Sepsis (HCC)    . Tremor         Treatment Team: Treatment Team: Attending Provider: Thurl Laquetta MATSU, MD; Consulting Provider: Thurl Laquetta MATSU, MD; Care Manager: Lucia Aquas; Primary Nurse: Rogers Edison, RN; Consulting Provider: Patel, Manojkumar, MD      The patient has been admitted to the hospital 3 times in the past 12 months.    Previous 4 Admission Dates Admission and Discharge Diagnosis Interventions Barriers Disposition   5/20 to 07/17/18       4/27/ to 5//120       01/17/18 to 01/21/2018                Patient and Family/Caregivers Goals of Care: to feel better     Caregivers Participating in Plan of Care/Discharge Plan with the patient: patient, case manager     Tentative dc plan: home     Anticipated DME needs for discharge: TBD    PRESCREENING COMPLETED FOR SNF n/a      Does the patient have appropriate clothing available to be worn at discharge? Yes , family to provide       The patient and care participants are willing to travel n/a  area for discharge facility.  The patient and plan of care participants have been provided with a list of all available Rehab Facilities or Home Health agencies as applicable. CM will follow up with a list of facilities or agencies that are  offer acceptance.    CM has disclosed any financial interest that [facility] may have with any facility or agency.    Anticipated Discharge Date: 01/18/2019    The plan of care and discharge plan has been discussed with Thurl, Laquetta MATSU, MD and all other appropriate providers and adjusted per interdisciplinary team recommendations and in discussion with the patient and the patient designated Care Plan Participants.    Barriers to Healthcare Success/ Readmission Risk Factors: multiple admits,     Consults:  Palliative Care Consult Recommended: n/a  Transitional Care Clinic Referral: n/a   Transitional Nurse Navigator Referral: n/a  Oncology Navigator Referral: n/a  SW consulted: yes  [facility] Consulted: n/a  Outside Hospital/Community Resources Referrals and Collaboration: n/a    Food/Nutrition Needs:   n/a                   Dietician Consulted: n/a    RRAT Score: Medium Risk  14       Total Score        3 Has Seen PCP in Last 6 Months (Yes=3, No=0)    2 Married. Living with Significant Other. Assisted Living. LTAC. SNF. or   Rehab    4 IP Visits Last 12 Months (1-3=4, 4=9, >4=11)    5 Pt. Coverage (Medicare=5 , Medicaid, or Self-Pay=4)        Criteria that do not apply:    Patient Length of Stay (>5 days = 3)    Charlson Comorbidity Score (Age + Comorbid Conditions)           PCP: Perfecto Dickey CROME, MD . How do you get to your doctor appointments? self    Specialists: n/a    Dialysis Unit: n/a     Pharmacy[facility] [employer] . Are there any medications that you have trouble paying for? No  Any difficulty getting your medications? No     DME available at Home:none    Home O2 L Flow: n/a       Home O2 Provider: n/a     Home Environment and Prior Level of Function: Lives at [address]  [address]  [place] [address] @HOMEPHONE @ [phone]. Lives with spouse   1 story.  0 Steps into home.  Responsibilities at home include independent with ADL's    Prior to admission  open services: none    Home Health Agency n/a   Personal Care Agency n/a     Extended Emergency Contact Information  Primary Emergency Contact: GAMMON,MARCIA  Mobile Phone: [phone]  Relation: None     Transportation:spouse will transport home    Therapy Recommendations:    OT = n/a     PT = n/a     SLP =  N/a      RT Home O2 Evaluation =  Will monitor for home 02 use     Wound Care =  n/a    Case Management Assessment    ABUSE/NEGLECT SCREENING   Physical Abuse/Neglect: Denies   Sexual Abuse: Denies   Sexual Abuse: Denies   Other Abuse/Issues: Denies          PRIMARY DECISION MAKER        self                           CARE MANAGEMENT INTERVENTIONS   Readmission Interview Completed: Not Applicable   PCP Verified by CM: Yes(Dr Deguzman- Brennan)           Mode of Transport at Discharge: Other (see comment)(family)       Transition of Care Consult (CM Consult): Discharge Planning               Discharge Durable Medical Equipment: No   Physical Therapy Consult: No   Occupational Therapy Consult: No   Speech Therapy Consult: No   Current Support Network: Lives with Spouse   Reason for Referral: DCP Rounds   History Provided By: Patient   Patient Orientation: Alert and Oriented, Person, Place, Situation, Self   Cognition: Alert   Support System Response: Cooperative   Previous Living Arrangement: Lives with Family Independent   Home Accessibility: Steps(no)   Prior Functional Level: Independent in ADLs/IADLs   Current Functional Level: Independent in ADLs/IADLs       Can patient return to prior living arrangement: Yes   Ability to make needs known:: Good   Family able to  assist with home care needs:: Yes                   Anticipated Discharge Needs: DME(may need home 02)                      DISCHARGE LOCATION   Discharge Placement: Home

## 2019-01-12 NOTE — Progress Notes (Signed)
Patient's abg results below. Patient is currently on 15 lpm HFNC via [place].    Results for Tommy Brown, Tommy Brown (MRN [id number]) as of 01/12/2019 06:27   Ref. Range 01/12/2019 05:27   pH Latest Ref Range: 7.350 - 7.450   7.468 (H)   PCO2 Latest Ref Range: 35.0 - 45.0 mm Hg 30.3 (L)   PO2 Latest Ref Range: 75 - 100 mm Hg 73.0 (L)   BICARBONATE Latest Ref Range: 18.0 - 26.0 mmol/L 22.0   O2 SAT Latest Ref Range: 90 - 100 % 96.0

## 2019-01-12 NOTE — Consults (Signed)
Consults  by Donavan Foil, MD at 01/12/19 [phone]                Author: Donavan Foil, MD  Service: Pulmonary Disease  Author Type: Physician       Filed: 01/12/19 1041  Date of Service: 01/12/19 0949  Status: Signed          Editor: Donavan Foil, MD (Physician)            Consult Orders        1. IP CONSULT TO PHYSICIAN [[id number]] ordered by Stanton Kidney, MD at 01/12/19 [phone]                                          [facility]          Pulmonary Consult Note      Patient: Tommy Brown               Sex: male          DOA: 01/10/2019         Date of Birth:  Feb 16, 1946      Age:  73 y.o.        LOS:  LOS: 2 days          Reason for Consult: Worsening hypoxemia in the setting of COVID-19 pneumonitis        IMPRESSION:     ??    Acute hypoxemic respiratory failure due to COVID-19 pneumonitis   ??  COVID-19 pneumonitis   ??  Urine culture growing Klebsiella sensitive to cephalosporin   ??  Hypertension   ??  BPH   ??  History of pancreatic cancer       RECOMMENDATIONS:     ??    Continue supplemental oxygen through high flow nasal cannula and/or Salters nasal cannula.  Titrate, and target oxygen saturation greater  than 88%.   ??  Encourage proning and deep breathing exercise.   ??  Continue remdesivir for 5 days.  Continue dexamethasone 6 mg IV daily for total 10 days.  S/p convalescent plasma.   ??  Continue azithromycin and ceftriaxone for 5 days.  Obtain sputum Gram stain and culture if possible.   ??  Monitor inflammatory markers.  Monitor D-dimer level.   ??  Continue subcu Lovenox for DVT prophylaxis   ??  Will defer respective systems problem management to primary and other consultant and follow patient with primary and other team   ??  Further recommendations will be based on the patient's response to recommended treatment and results of the investigation ordered.          HPI:     [name] is a  73 y.o. male who carries known history of coronary  artery disease status post CABG, pancreatic  cancer, and BPH presented to [facility] on 01/10/2019 with fever, and chills.  At the time of presentation he denied any shortness of breath and/or other associated pulmonary symptoms.  He was tested positive for COVID-19 infection.  He received convalescent  plasma.  And currently receiving dexamethasone and remdesivir.  Since hospitalization there has been gradual increase in patient's oxygen requirement.  He is currently on oxygen through Salters nasal cannula to maintain oxygen saturation greater than  88%.      Review of Systems   Review of Systems  Constitutional: Positive for fever and malaise/fatigue .    HENT: Negative.     Eyes: Negative.     Respiratory: Positive for shortness of breath.     Cardiovascular: Negative.     Gastrointestinal: Negative.     Genitourinary: Negative.     Musculoskeletal: Negative.     Skin: Negative.     Neurological: Negative.     Endo/Heme/Allergies: Negative.     Psychiatric/Behavioral: Negative.              Past Medical History     Past Medical History:        Diagnosis  Date         ?  Arthritis       ?  Benign prostate hyperplasia       ?  Bladder infection       ?  Chronic kidney disease       ?  Coronary arteriosclerosis       ?  Coronary atherosclerosis of artery bypass graft       ?  Decreased testosterone level       ?  Diverticular disease       ?  Essential hypertension       ?  Gout       ?  History of acute renal failure       ?  History of anemia       ?  History of malignant neoplasm of pancreas       ?  Hx of CABG       ?  Kidney stone       ?  Kidney stones       ?  Neuropathy       ?  Pancreatic cancer ([place])       ?  Sepsis ([place])           ?  Tremor             Past Surgical History     Past Surgical History:         Procedure  Laterality  Date          ?  HX CERVICAL FUSION         ?  HX COLONOSCOPY         ?  HX CORONARY ARTERY BYPASS GRAFT    1995     ?  HX HEART CATHETERIZATION    2006     ?  HX ORTHOPAEDIC               back          ?  HX OTHER SURGICAL    2006          whipple procedure done for pancreatic cancer          ?  HX UROLOGICAL    2012          Percutaneous extraction of a kidney stone w/ fragmentation procedure            Family History     Family History         Problem  Relation  Age of Onset          ?  Alcohol abuse  Father       ?  Heart Disease  Brother            ?  Emphysema  Brother  Social History     Social History          Tobacco Use         ?  Smoking status:  Never Smoker     ?  Smokeless tobacco:  Never Used       Substance Use Topics         ?  Alcohol use:  Not Currently         ?  Drug use:  Not Currently            Medications     Current Facility-Administered Medications          Medication  Dose  Route  Frequency           ?  dextrose 5% and 0.9% NaCl infusion   75 mL/hr  IntraVENous  CONTINUOUS     ?  promethazine (PHENERGAN) 25 mg in NS IVPB   25 mg  IntraVENous  Q6H PRN     ?  allopurinoL (ZYLOPRIM) tablet 300 mg   300 mg  Oral  DAILY     ?  gabapentin (NEURONTIN) capsule 600 mg   600 mg  Oral  BID     ?  Saccharomyces boulardii (FLORASTOR) capsule 250 mg   250 mg  Oral  ACB&D     ?  lipase-protease-amylase (CREON 24,000) capsule 1 Cap   1 Cap  Oral  TID WITH MEALS     ?  metoprolol succinate (TOPROL-XL) XL tablet 25 mg   25 mg  Oral  DAILY     ?  polyvinyl alcohol-povidon(PF) (REFRESH CLASSIC) 1.4-0.6 % ophthalmic solution 2 Drop   2 Drop  Both Eyes  DAILY     ?  tamsulosin (FLOMAX) capsule 0.8 mg   0.8 mg  Oral  DAILY     ?  temazepam (RESTORIL) capsule 7.5 mg   7.5 mg  Oral  QHS PRN           ?  naloxone (NARCAN) injection 0.1 mg   0.1 mg  IntraVENous  PRN           ?  cefTRIAXone (ROCEPHIN) 1 g in 0.9% sodium chloride (MBP/ADV) 50 mL MBP   1 g  IntraVENous  Q24H     ?  ondansetron (ZOFRAN) injection 4 mg   4 mg  IntraVENous  Q4H PRN     ?  enoxaparin (LOVENOX) injection 40 mg   40 mg  SubCUTAneous  Q24H     ?  amLODIPine (NORVASC) tablet 10 mg   10 mg  Oral  DAILY     ?   azithromycin (ZITHROMAX) 500 mg in 0.9% sodium chloride 250 mL IVPB   500 mg  IntraVENous  Q24H     ?  hydrALAZINE (APRESOLINE) 20 mg/mL injection 10 mg   10 mg  IntraVENous  Q6H PRN     ?  0.9% sodium chloride infusion 250 mL   250 mL  IntraVENous  PRN     ?  dexamethasone (DECADRON) 4 mg/mL injection 6 mg   6 mg  IntraVENous  Q24H     ?  remdesivir 100 mg in 0.9% sodium chloride 250 mL IVPB   100 mg  IntraVENous  QHS           ?  ketorolac (TORADOL) injection 15 mg   15 mg  IntraVENous  Q8H PRN          Prior to Admission medications  Medication  Sig  Start Date  End Date  Taking?  Authorizing Provider            metoprolol succinate (TOPROL-XL) 25 mg XL tablet  TAKE 1 TABLET BY MOUTH EVERY DAY  07/21/18    Yes  Provider, Historical     tamsulosin (Flomax) 0.4 mg capsule  Take 2 Caps by mouth daily.  07/17/18    Yes  Kristen Loader, MD     gabapentin (NEURONTIN) 600 mg tablet  Take 600 mg by mouth two (2) times a day.      Yes  Other, Phys, MD     amLODIPine (NORVASC) 5 mg tablet  Take 5 mg by mouth daily.      Yes  Other, Phys, MD     allopurinol (ZYLOPRIM) 300 mg tablet  Take  by mouth daily.      Yes  Other, Phys, MD     aspirin 81 mg chewable tablet  Take 81 mg by mouth daily.      Yes  Other, Phys, MD     testosterone (ANDROGEL) 20.25 mg/1.25 gram (1.62 %) gel  Apply 20 mg to affected area daily. Max Daily Amount: 20 mg.  12/21/18      Other, Phys, MD     bimatoprost (Lumigan) 0.01 % ophthalmic drops  Lumigan 0.01 % eye drops        Provider, Historical            promethazine (PHENERGAN) 12.5 mg tablet  promethazine 12.5 mg tablet    TAKE 1 TABLET BY MOUTH 2 TIMES PER DAY AS NEEDED        Provider, Historical            testosterone cypionate (DEPOTESTOTERONE CYPIONATE) 200 mg/mL injection  INJECT 0.5 MILLILITER INTO THE MUSCLE EVERY 2 WEEKS DISCARD VIAL AFTER USE  11/16/18      Provider, Historical     ketorolac (TORADOL) 10 mg tablet  Take 1 Tab by mouth three (3) times daily as needed for Pain.   08/06/18      Retia Passe, MD     lipase-protease-amylase (Creon) 24,000-76,000 -120,000 unit capsule  Take  by mouth.        Provider, Historical     lipase-protease-amylase (Creon) 24,000-76,000 -120,000 unit capsule  Take  by mouth daily as needed (with snacks and meals).        Provider, Historical     temazepam (RESTORIL) 7.5 mg capsule  Take  by mouth nightly.        Provider, Historical     lactobacillus rhamnosus gg 10 billion cell (Culturelle) 10 billion cell capsule  Take 1 Cap by mouth Before breakfast and dinner.  07/17/18      Kristen Loader, MD     peg 400-propylene glycol (Systane, propylene glycol,) 0.4-0.3 % drop  Administer 2 Drops to both eyes daily. Patient places two drops in both eyes every AM.   Indications: dry eye        Provider, Historical     metoprolol tartrate (LOPRESSOR) 25 mg tablet  Take 25 mg by mouth daily.        Other, Phys, MD            TESTOSTERONE IM  by IntraMUSCular route.        Other, Phys, MD           Allergy     Allergies        Allergen  Reactions         ?  Other Food  Other (comments)             VISTAID   ANESTHESIA         ?  Codeine  Itching and Swelling     ?  Oxycodone  Anaphylaxis     ?  Hydroxyzine Pamoate  Itching and Other (comments)             Other reaction(s): Unknown            ?  Meperidine  Itching, Palpitations, Swelling and Other (comments)             Other reaction(s): Unknown            ?  Metoclopramide  Itching, Other (comments) and Swelling             Other reaction(s): Other (See Comments)   Reaction unknown    Reaction unknown             ?  Morphine  Other (comments) and Nausea and Vomiting     ?  Oxycodone-Acetaminophen  Itching     ?  Prochlorperazine  Itching, Other (comments) and Unknown (comments)             Other reaction(s): Unknown   Unsure of reaction            ?  Hydroxyzine Hcl  Hives             Physical Exam:     Vital Signs:       Blood pressure 138/73, pulse 69, temperature 97.8 ??F (36.6 ??C), resp. rate 18, weight 61.2  kg (135 lb), SpO2 90 %.  Body mass index is 20.53 kg/m??.      O2 Device: Hi flow nasal cannula, Humidifier(salter)     O2 Flow Rate (L/min): 15 l/min     Temp (24hrs), Avg:98 ??F (36.7 ??C), Min:97.7 ??F (36.5 ??C), Max:98.2 ??F (36.8 ??C)           Intake/Output:    Last shift:      11/17 0701 - 11/17 1900   In: -    Out: 400 [Urine:400]   Last 3 shifts: 11/15 1901 - 11/17 0700   In: 1936.3 [P.O.:320; I.V.:1416.3]   Out: 1825 [Urine:1825]      Intake/Output Summary (Last 24 hours) at 01/12/2019 0950   Last data filed at 01/12/2019 8119     Gross per 24 hour        Intake  1816.25 ml        Output  1275 ml        Net  541.25 ml         Physical Exam   Vitals signs and nursing note reviewed.   HENT :       Mouth/Throat:      Mouth: Mucous membranes are moist.   Eyes :       Pupils: Pupils are equal, round, and reactive to light.   Cardiovascular:       Rate and Rhythm: Regular rhythm.      Heart sounds: Murmur present.   Pulmonary:       Breath sounds: No wheezing.   Abdominal:      General: Bowel sounds are normal.      Palpations: Abdomen is soft.     Musculoskeletal:          General: No swelling.    Skin:  General: Skin is warm and dry.   Neurological :       General: No focal deficit present.      Mental Status: He is alert and oriented to person, place, and time.                  Labs Reviewed:     Recent Results (from the past 24 hour(s))     CBC WITH AUTOMATED DIFF          Collection Time: 01/12/19  3:21 AM         Result  Value  Ref Range            WBC  4.8  4.0 - 11.0 1000/mm3            RBC  4.26  3.80 - 5.70 M/uL       HGB  9.6 (L)  12.4 - 17.2 gm/dl       HCT  32.1 (L)  37.0 - 50.0 %       MCV  75.4 (L)  80.0 - 98.0 fL       MCH  22.5 (L)  23.0 - 34.6 pg       MCHC  29.9 (L)  30.0 - 36.0 gm/dl       PLATELET  138 (L)  140 - 450 1000/mm3       RDW-SD  59.5 (H)  35.1 - 43.9         NRBC  0  0 - 0         IMMATURE GRANULOCYTES  0.4  0.0 - 3.0 %       NEUTROPHILS  82.5 (H)  34 - 64 %       LYMPHOCYTES  11.9  (L)  28 - 48 %       MONOCYTES  5.0  1 - 13 %       EOSINOPHILS  0.0  0 - 5 %       BASOPHILS  0.2  0 - 3 %       METABOLIC PANEL, COMPREHENSIVE          Collection Time: 01/12/19  3:21 AM         Result  Value  Ref Range            Sodium  142  136 - 145 mEq/L       Potassium  4.4  3.5 - 5.1 mEq/L       Chloride  111 (H)  98 - 107 mEq/L       CO2  25  21 - 32 mEq/L       Glucose  184 (H)  74 - 106 mg/dl       BUN  24  7 - 25 mg/dl       Creatinine  1.2  0.6 - 1.3 mg/dl       GFR est AA  >60          GFR est non-AA  >60          Calcium  8.6  8.5 - 10.1 mg/dl       AST (SGOT)  72 (H)  15 - 37 U/L       ALT (SGPT)  35  12 - 78 U/L       Alk. phosphatase  101  45 - 117 U/L       Bilirubin, total  0.3  0.2 - 1.0 mg/dl  Protein, total  6.9  6.4 - 8.2 gm/dl       Albumin  2.5 (L)  3.4 - 5.0 gm/dl       Anion gap  6  5 - 15 mmol/L       PROCALCITONIN          Collection Time: 01/12/19  3:21 AM         Result  Value  Ref Range            PROCALCITONIN  0.10  0.00 - 0.50 ng/ml       C REACTIVE PROTEIN, QT          Collection Time: 01/12/19  3:21 AM         Result  Value  Ref Range            C-Reactive protein  81.2 (H)  0.0 - 2.9 mg/L       LACTIC ACID          Collection Time: 01/12/19  3:21 AM         Result  Value  Ref Range            Lactic Acid  1.7  0.4 - 2.0 mmol/L       FERRITIN          Collection Time: 01/12/19  3:21 AM         Result  Value  Ref Range            Ferritin  190.8  26.0 - 388.0 ng/ml       LD          Collection Time: 01/12/19  3:21 AM         Result  Value  Ref Range            LD  337 (H)  87 - 241 U/L       TRIGLYCERIDE          Collection Time: 01/12/19  3:21 AM         Result  Value  Ref Range            Triglyceride  105  29 - 150 mg/dl       FIBRINOGEN          Collection Time: 01/12/19  3:21 AM         Result  Value  Ref Range            Fibrinogen  443 (H)  220 - 397 mg/dl       D DIMER          Collection Time: 01/12/19  3:21 AM         Result  Value  Ref Range            D DIMER   0.52 (H)  0.01 - 0.50 ug/mL (FEU)       TROPONIN I          Collection Time: 01/12/19  3:21 AM         Result  Value  Ref Range            Troponin-I  0.035  0.000 - 0.045 ng/ml       MAGNESIUM          Collection Time: 01/12/19  3:21 AM         Result  Value  Ref Range            Magnesium  2.1  1.6 - 2.6 mg/dl       POC BLOOD GAS + LACTIC ACID          Collection Time: 01/12/19  4:17 AM         Result  Value  Ref Range            pH  7.440  7.350 - 7.450         PCO2  32.5 (L)  35.0 - 45.0 mm Hg       PO2  53.0 (LL)  75 - 100 mm Hg       BICARBONATE  22.1  18.0 - 26.0 mmol/L       O2 SAT  89.0 (L)  90 - 100 %       CO2, TOTAL  23.0 (L)  24 - 29 mmol/L       Lactic Acid  1.41  0.40 - 2.00 mmol/L       BASE EXCESS  -2  -2 - 3 mmol/L       Patient temp.  98.0 F          Sample type  Art          SITE  L Radial          DEVICE  Nasal Can          ALLENS TEST  Pass          Liters/min.  6          NOTIFIED  RN          POC BLOOD GAS + LACTIC ACID          Collection Time: 01/12/19  5:27 AM         Result  Value  Ref Range            pH  7.468 (H)  7.350 - 7.450         PCO2  30.3 (L)  35.0 - 45.0 mm Hg       PO2  73.0 (L)  75 - 100 mm Hg       BICARBONATE  22.0  18.0 - 26.0 mmol/L       O2 SAT  96.0  90 - 100 %       CO2, TOTAL  23.0 (L)  24 - 29 mmol/L       Lactic Acid  1.06  0.40 - 2.00 mmol/L       BASE EXCESS  -2  -2 - 3 mmol/L       Patient temp.  98.0 F          Sample type  Art          FIO2  80          SITE  L Radial          DEVICE  HFNC          ALLENS TEST  Pass          Liters/min.  15               NOTIFIED  RN                 No results for input(s): FIO2I, IFO2, HCO3I, IHCO3, [place], PCO2I, PCOPOC, IPHI, PHI, PHPOC, PO2I, PO2POC in the last 72 hours.      No lab exists for component: IPOC2        All Micro Results  Procedure  Component  Value  Units  Date/Time           CULTURE, URINE [[id number]]  (Abnormal)  (Susceptibility)  Collected:  01/10/19 1305            Order Status:  Completed   Specimen:  Urine from Clean catch  Updated:  01/12/19 0856                Isolate  --                    >100,000 CFU/mL   Klebsiella pneumoniae ssp pneumoniae                     Isolate  --                    50,000 CFU/mL   Gram Negative Bacilli Isolated   Identification And Susceptibility To Follow                CULTURE, BLOOD [[id number]]  Collected:  01/10/19 1147            Order Status:  Completed  Specimen:  Blood  Updated:  01/12/19 0709                Blood Culture Result  No Growth At 24 Hours                CULTURE, BLOOD [[id number]]  Collected:  01/10/19 1130            Order Status:  Completed  Specimen:  Blood  Updated:  01/12/19 0709                Blood Culture Result  No Growth At 24 Hours                LEGIONELLA PNEUMOPHILA AG, URINE [[id number]]  Collected:  01/10/19 1305            Order Status:  Completed  Specimen:  Urine  Updated:  01/10/19 1439                Legionella Ag, urine  NEGATIVE                  Comment:  Presumptive negative for L. pneumophila serogroup 1 antigen in urine, suggesting no recent or   current infection. Infection due to Legionella cannot be ruled out since other serogroups and   species may cause disease, antigen may not be present in urine in early infection and the level   of antigen present in the urine may be below the detection limit of the test.                        S.PNEUMO AG, UR/CSF [[id number]]  (Abnormal)  Collected:  01/10/19 1305            Order Status:  Completed  Specimen:  Urine  Updated:  01/10/19 1438                Strep pneumo Ag, urine  POSITIVE                  SARS-COV-2_FLU [[id number]]  (Abnormal)  Collected:  01/10/19 1050            Order Status:  Completed  Specimen:  NASOPHARYNGEAL SWAB  Updated:  01/10/19 1230  COVID-19  POSITIVE                    Comment:  Negative results do not preclude SARS-CoV-2 infection and should not be used as the sole basis   for treatment or other patient management decisions. Negative  results must be combined with   clinical observations, patient history, and epidemiological information.   Testing with the Xpert Xpress SARS-CoV-2 test is intended for use by trained operators who are   proficient in performing tests using either GeneXpert Dx, GeneXpert Infinity and/or [employer]. The Xpert Xpress SARS-CoV-2 test is only for use under the Food and Drug   Administration's Emergency Use Authorization.   THE  pos sars cov2_  RESULT WAS CALLED TO (AND READ BACK BY) MEGAN, ER _.              THE NOTIFICATION WAS MADE ON _11/15/20   BY _BD                        CULTURE, RESPIRATORY/SPUTUM/BRONCH [name] [[id number]]              Order Status:  No result  Specimen:  Sputum                         Imaging:   [x]  I have personally reviewed the patients chest radiographs images and report  with the patient     Results from Hospital Encounter encounter on 01/10/19     XR CHEST SNGL V           Narrative  EXAM:  Chest AP      INDICATIONS: sepsis       COMPARISON: Most recently 07/15/2018.      FINDINGS:       Multiple bilateral airspace opacities, new from the previous study. No pleural   effusion or pneumothorax.      Top normal heart size. Median sternotomy wires and changes post-CABG.              Impression  IMPRESSION:    Multiple patchy bilateral airspace opacities, concerning for multifocal   pneumonia.                Results from [facility] encounter on 06/22/18     CT ABD PELV WO CONT           Narrative  CT Abdomen and Pelvis without      Indication: Left hydronephrosis. Evaluate for obstructing stone or lesion.      Comparison: Ultrasound 06/22/2018. CT 05/21/2018.      TECHNIQUE:    CT of the abdomen and pelvis WITHOUT intravenous contrast. Coronal and sagittal   reformations were obtained.          All CT exams at this facility use one or more dose reduction techniques   including automatic exposure control, mA/kV adjustment per patient's size, or   iterative  reconstruction technique. DICOM format imaged data is available to   non-affiliated external healthcare facilities or entities on a secure, media   free, reciprocal searchable basis with patient authorization for 12 months   following the date of the study.      DISCUSSION:      ABSENCE OF INTRAVENOUS CONTRAST DECREASES SENSITIVITY FOR DETECTION OF FOCAL   LESIONS AND VASCULAR PATHOLOGY.      LOWER THORAX: Coronary artery calcifications.  HEPATOBILIARY: Multiple subcentimeter foci of hypoattenuation, too small to   characterize but unchanged. Cholecystectomy. Left intrahepatic pneumobilia,   unchanged.   SPLEEN: No splenomegaly.   PANCREAS: Postoperative changes from prior Whipple procedure. No mass or duct   dilatation in the residual pancreatic tail.      ADRENALS: No adrenal nodules.   KIDNEYS/URETERS: Multiple bilateral renal cysts. Multiple small (2 to 4 mm)   nonobstructing bilateral renal calculi. There is a 10 x 9 mm nonobstructing   calculus in the lower pole of the left kidney. Moderate left hydronephrosis and   hydroureter. There is a 8 x 7 mm obstructing calculus in the proximal left   ureter. No right hydronephrosis. No suspicious renal lesions.   PELVIC ORGANS/BLADDER: Enlarged prostate. Penile implant. The bladder is   unremarkable.      PERITONEUM / RETROPERITONEUM: No free air or fluid.   LYMPH NODES: No lymphadenopathy.   VESSELS: Extensive atherosclerotic vascular calcifications. Infrarenal abdominal   aorta ectasia.      GI TRACT: No distention or wall thickening. Diverticulosis. Normal appendix.      BONES AND SOFT TISSUES: Posterior lumbar fixation at L4-5. Mild retrolisthesis   at L2-3 and L3-4. Moderate to advanced disc height loss at L2-3. Mild   spondylosis of the remaining lumbar spine.              Impression  IMPRESSION:      1.  Obstructing 8 mm calculus in the left proximal ureter causing moderate left   hydronephrosis.   2.  Multiple additional nonobstructing bilateral renal  calculi.   3.  Multiple bilateral renal cysts.   4.  Diverticulosis.   5.  Postoperative changes from prior Whipple procedure.   6.  Multiple hypoattenuating liver lesions, unchanged.               [x]  See my orders for details      My assessment, plan  of care, findings, medications, side effects etc were discussed with:      [x]  nursing  []  PT/OT      [x]  respiratory therapy  []  Dr.     []  family  []  Patient        [x]  Total care time exclusive of procedures 49 m inutes with complex decision making performed and > 50% time spent in face to face consultation.      Donavan Foil, MD

## 2019-01-13 ENCOUNTER — Inpatient Hospital Stay: Payer: MEDICARE | Primary: Family Medicine

## 2019-01-13 LAB — METABOLIC PANEL, COMPREHENSIVE
ALT (SGPT): 71 U/L (ref 12–78)
AST (SGOT): 122 U/L — ABNORMAL HIGH (ref 15–37)
Albumin: 2.6 gm/dl — ABNORMAL LOW (ref 3.4–5.0)
Alk. phosphatase: 101 U/L (ref 45–117)
Anion gap: 7 mmol/L (ref 5–15)
BUN: 25 mg/dl (ref 7–25)
Bilirubin, total: 0.6 mg/dl (ref 0.2–1.0)
CO2: 24 mEq/L (ref 21–32)
Calcium: 8.1 mg/dl — ABNORMAL LOW (ref 8.5–10.1)
Chloride: 114 mEq/L — ABNORMAL HIGH (ref 98–107)
Creatinine: 1.4 mg/dl — ABNORMAL HIGH (ref 0.6–1.3)
GFR est AA: >60
GFR est non-AA: 53
Glucose: 152 mg/dl — ABNORMAL HIGH (ref 74–106)
Potassium: 4.4 mEq/L (ref 3.5–5.1)
Protein, total: 6.9 gm/dl (ref 6.4–8.2)
Sodium: 144 mEq/L (ref 136–145)

## 2019-01-13 LAB — D DIMER: D DIMER: 0.7 ug/mL (FEU) — ABNORMAL HIGH (ref 0.01–0.50)

## 2019-01-13 LAB — CBC WITH AUTOMATED DIFF
BASOPHILS: 0.1 % (ref 0–3)
EOSINOPHILS: 0 % (ref 0–5)
HCT: 31.6 % — ABNORMAL LOW (ref 37.0–50.0)
HGB: 9.5 gm/dl — ABNORMAL LOW (ref 12.4–17.2)
IMMATURE GRANULOCYTES: 0.7 % (ref 0.0–3.0)
LYMPHOCYTES: 7.7 % — ABNORMAL LOW (ref 28–48)
MCH: 22.7 pg — ABNORMAL LOW (ref 23.0–34.6)
MCHC: 30.1 gm/dl (ref 30.0–36.0)
MCV: 75.4 fL — ABNORMAL LOW (ref 80.0–98.0)
MONOCYTES: 5.5 % (ref 1–13)
NEUTROPHILS: 86 % — ABNORMAL HIGH (ref 34–64)
NRBC: 0 (ref 0–0)
PLATELET: 169 10*3/uL (ref 140–450)
RBC: 4.19 M/uL (ref 3.80–5.70)
RDW-SD: 59.5 — ABNORMAL HIGH (ref 35.1–43.9)
WBC: 6.8 10*3/uL (ref 4.0–11.0)

## 2019-01-13 LAB — FERRITIN
Ferritin: 185.3 ng/mL (ref 26.0–388.0)
Ferritin: 185.3 ng/ml (ref 26.0–388.0)

## 2019-01-13 LAB — MAGNESIUM
Magnesium: 2.2 mg/dL (ref 1.6–2.6)
Magnesium: 2.2 mg/dl (ref 1.6–2.6)

## 2019-01-13 LAB — C REACTIVE PROTEIN, QT: C-Reactive protein: 29.6 mg/L — ABNORMAL HIGH (ref 0.0–2.9)

## 2019-01-13 LAB — CBC WITH AUTO DIFFERENTIAL
Basophils %: 0.1 % (ref 0–3)
Eosinophils %: 0 % (ref 0–5)
Hematocrit: 31.6 % — ABNORMAL LOW (ref 37.0–50.0)
Hemoglobin: 9.5 gm/dl — ABNORMAL LOW (ref 12.4–17.2)
Immature Granulocytes: 0.7 % (ref 0.0–3.0)
Lymphocytes %: 7.7 % — ABNORMAL LOW (ref 28–48)
MCH: 22.7 pg — ABNORMAL LOW (ref 23.0–34.6)
MCHC: 30.1 gm/dl (ref 30.0–36.0)
MCV: 75.4 fL — ABNORMAL LOW (ref 80.0–98.0)
Monocytes %: 5.5 % (ref 1–13)
Neutrophils %: 86 % — ABNORMAL HIGH (ref 34–64)
Nucleated RBCs: 0 (ref 0–0)
Platelets: 169 10*3/uL (ref 140–450)
RBC: 4.19 M/uL (ref 3.80–5.70)
RDW-SD: 59.5 — ABNORMAL HIGH (ref 35.1–43.9)
WBC: 6.8 10*3/uL (ref 4.0–11.0)

## 2019-01-13 LAB — COMPREHENSIVE METABOLIC PANEL
ALT: 71 U/L (ref 12–78)
AST: 122 U/L — ABNORMAL HIGH (ref 15–37)
Albumin: 2.6 gm/dl — ABNORMAL LOW (ref 3.4–5.0)
Alkaline Phosphatase: 101 U/L (ref 45–117)
Anion Gap: 7 mmol/L (ref 5–15)
BUN: 25 mg/dl (ref 7–25)
CO2: 24 mEq/L (ref 21–32)
Calcium: 8.1 mg/dl — ABNORMAL LOW (ref 8.5–10.1)
Chloride: 114 mEq/L — ABNORMAL HIGH (ref 98–107)
Creatinine: 1.4 mg/dl — ABNORMAL HIGH (ref 0.6–1.3)
EGFR IF NonAfrican American: 53
GFR African American: >60
Glucose: 152 mg/dl — ABNORMAL HIGH (ref 74–106)
Potassium: 4.4 mEq/L (ref 3.5–5.1)
Sodium: 144 mEq/L (ref 136–145)
Total Bilirubin: 0.6 mg/dl (ref 0.2–1.0)
Total Protein: 6.9 gm/dl (ref 6.4–8.2)

## 2019-01-13 LAB — D-DIMER, QUANTITATIVE: D-Dimer, Quant: 0.7 ug/mL (FEU) — ABNORMAL HIGH (ref 0.01–0.50)

## 2019-01-13 LAB — C-REACTIVE PROTEIN: CRP: 29.6 mg/L — ABNORMAL HIGH (ref 0.0–2.9)

## 2019-01-13 MED FILL — REMDESIVIR 100 MG INTRAVENOUS POWDER FOR SOLUTION: 100 mg | INTRAVENOUS | Qty: 100

## 2019-01-13 MED FILL — CEFTRIAXONE 1 GRAM SOLUTION FOR INJECTION: 1 gram | INTRAMUSCULAR | Qty: 1

## 2019-01-13 MED FILL — AZITHROMYCIN 500 MG IV SOLUTION: 500 mg | INTRAVENOUS | Qty: 5

## 2019-01-13 MED FILL — KETOROLAC TROMETHAMINE 15 MG/ML INJECTION: 15 mg/mL | INTRAMUSCULAR | Qty: 1

## 2019-01-13 MED FILL — AMLODIPINE 5 MG TAB: 5 mg | ORAL | Qty: 2

## 2019-01-13 MED FILL — FLORASTOR 250 MG CAPSULE: 250 mg | ORAL | Qty: 1

## 2019-01-13 MED FILL — DEXAMETHASONE SODIUM PHOSPHATE 4 MG/ML IJ SOLN: 4 mg/mL | INTRAMUSCULAR | Qty: 2

## 2019-01-13 MED FILL — ENOXAPARIN 40 MG/0.4 ML SUB-Q SYRINGE: 40 mg/0.4 mL | SUBCUTANEOUS | Qty: 0.4

## 2019-01-13 MED FILL — GABAPENTIN 300 MG CAP: 300 mg | ORAL | Qty: 2

## 2019-01-13 MED FILL — POLYVINYL ALCOHOL-POVIDONE 1.4 %-0.6 % EYE DROPPERETTE: OPHTHALMIC | Qty: 1

## 2019-01-13 MED FILL — METOPROLOL SUCCINATE SR 25 MG 24 HR TAB: 25 mg | ORAL | Qty: 1

## 2019-01-13 MED FILL — TEMAZEPAM 7.5 MG CAP: 7.5 mg | ORAL | Qty: 1

## 2019-01-13 MED FILL — TAMSULOSIN SR 0.4 MG 24 HR CAP: 0.4 mg | ORAL | Qty: 2

## 2019-01-13 MED FILL — CREON 24,000-76,000-120,000 UNIT CAPSULE,DELAYED RELEASE: 24000-76000 -120,000 unit | ORAL | Qty: 1

## 2019-01-13 MED FILL — ALLOPURINOL 300 MG TAB: 300 mg | ORAL | Qty: 1

## 2019-01-13 MED FILL — REMDESIVIR 100 MG/20 ML (5 MG/ML) IV SOLN (FOR PTS 40 KG OR MORE): 100 mg/20 mL (5 mg/mL) | INTRAVENOUS | Qty: 100

## 2019-01-13 NOTE — Consults (Addendum)
INFECTIOUS DISEASE CONSULT NOTE       Infectious Disease Attending    I have reviewed Tommy Brown's  chart. I saw and examined the patient, personally performed the key portions of the service and discussed the management with the treatment team. I reviewed the NP's note and concur with the history, findings and plan of care and my edits as needed. Laboratory, hemodynamic and radiological data were independently reviewed.     -Tommy Brown is a 73 year old male with past medical history of hypertension, CAD, prostate cancer, hydronephrosis requiring stent was admitted for shortness of breath, positive COVID-19 and multifocal pneumonia    -Patient with multifocal pneumonia on chest x-ray  -Positive for COVID-19  -patient also with positive strep pneumo antigen  -We will need to check sputum culture  -As patient also hypoxic, agree with IV remdesivir, steroids and convalescent plasma  -Continue on broad-spectrum antibiotics with ceftriaxone and azithromycin pending further results  -Watch for any worsening  -Inflammatory markers mildly elevated D-dimer of 0.7    -Patient's urine culture with Klebsiella and E. Coli  -Given presence of stent, will need renal ultrasound  -Monitor labs and cultures      Tommy Pellegrini, MD         Requested by: [name]    Reason for consult: Strep pneumonia and UTI    Date of admission: 01/10/2019    Date of consult: January 13, 2019      ABX:     Current abx Prior abx    Zithromax 11/15-3  Rocephin 11/15-3    Decadron 11/15-3  Remdesivir 11/15-3  Convalescent plasma 11/15      ASSESSMENT:      Pneumonia--Strep Pneumonia Urine ag +   Viral pneumonia--COVID-19 positive   UTI  UA unrevealing  Urine culture positive clubs and E. coli   History of left hydronephrosis due to renal calculi  06/22/18 JJ stent placement   07/31/2018 cystoscopy left retrograde pyelogram left ureteroscopy laser lithotripsy proximal ureteral stone and extraction of left renal calculi and exchange of double-J stents  08/06/2018 during stent removed   CAD s/p CABG   Pancreatic cancer  2006 for procedure, radiation, chemo   Gout   Hypogonadism            RECOMMENDATIONS:   Complicated 73 year old gentleman who presented with fever shortness of breath found to be Covid positive as well as strep pneumonia positive.  UC +    -Continue ceftriaxone for strep pneumonia  -Patient history with hydronephrosis due to obstructing stone requiring stents ultimately laser lithotripsy, stent now out  -Patient asymptomatic, UA negative nitrite, negative leukocyte esterase and no white blood cells so doubt true urinary tract infection however given history, will order ultrasound kidney ureters and bladder.  Urine cx currently covered with Ceftriaxone.   -Post void residual  -Monitor and trend inflammatory markers  -prone as tolerated  -IS every 2 hours  -Flutter valve  -Decadron, Remdesivir as ordered by primary team  -Monitor renal and liver function closely while on treatment   -AST increasing, Cr up   -s/p CP  -Monitor culture data  -Monitor labs    60  Minutes spent  reviewing EMR, cultures, labs interviewing and examining with greater than 50% of time spent on education answering questions and coordinating care.              COVID-19 Lab test monitoring:   Blood type: A Rh Negative  Pregnancy:  Quantiferon TB gold:  Labs:  Recent Labs     01/12/19  0527 01/12/19  0417   PTEMPI 98.0 F 98.0 F   O2ST 96.0 89.0*   FIO2 80  --      Recent Labs     01/13/19  0639 01/12/19  0321 01/10/19  2351 01/10/19  1711   WBC 6.8 4.8 2.7* 3.4*   CREA 1.4* 1.2 1.2 1.2   PLT 169 138* 130* 128*   AST 122* 72* 54* 64*   ALT 71 35 24 27     Recent Labs     01/13/19  0948 01/12/19  0321   CRP 29.6* 81.2*   FERR 185.3 190.8   LDH  --  337*   DDIME 0.70* 0.52*   FIBRN  --  443*    TRIGL  --  105                      MICROBIOLOGY:   11/15  BC x2 NGTD   UC Klebsiella pneumonia and E. Coli   Strep pneumonia urine antigen positive   Legionella urine antigen negative   19+    LINES AND CATHETERS:   PIV    HPI:   Tommy Brown is a 73 year old gentleman with past medical history significant for hypertension CAD status post CABG, gout, pancreatic cancer (2006 with Whipple procedure, radiation and chemo) and left hydronephrosis status post stent placement.  Has history of 8 mm proximal ureteral stone with hydroureteronephrosis and underwent cystoscopy and left JJ stent placement on 06/22/18 and ultimately underwent a cystoscopy, left retrograde pyelogram left ureteroscopy with laser lithotripsy of the proximal ureteral stone and extraction of left renal calculi and exchange of double-J stents on 07/31/2018.  Follow-up KUB was done on 08/06/2018 which revealed left ureteral stent in good position, no further ureteral renal stones noted therefore stent was removed.  He developed low-grade fevers and flulike symptoms for approximately 1 week and became concerned when he developed shortness of breath.  He called 911 was brought to [facility] ER where he was found to be COVID-19 positive.  X-ray showed multifocal patchy bilateral airspace opacities concerning for multifocal pneumonia.  WBC on admission was 2.7 Hgb 10.1 HCT 33.1 PLT 130.  Urine culture was sent and is positive for Klebsiella pneumonia and E. coli.  Additionally urine antigen for strep pneumonia has returned positive.  ID has been consulted for further management for UTI and pneumonia.    Past medical history:     Past Medical History:   Diagnosis Date   ??? Arthritis    ??? Benign prostate hyperplasia    ??? Bladder infection    ??? Chronic kidney disease    ??? Coronary arteriosclerosis    ??? Coronary atherosclerosis of artery bypass graft    ??? Decreased testosterone level    ??? Diverticular disease    ??? Essential hypertension    ??? Gout     ??? History of acute renal failure    ??? History of anemia    ??? History of malignant neoplasm of pancreas    ??? Hx of CABG    ??? Kidney stone    ??? Kidney stones    ??? Neuropathy    ??? Pancreatic cancer ([place])    ??? Sepsis ([place])    ??? Tremor        Past Surgical History:   Procedure Laterality Date   ??? HX CERVICAL FUSION     ??? HX COLONOSCOPY     ???  HX CORONARY ARTERY BYPASS GRAFT  1995   ??? HX HEART CATHETERIZATION  2006   ??? HX ORTHOPAEDIC      back   ??? HX OTHER SURGICAL  2006    whipple procedure done for pancreatic cancer   ??? HX UROLOGICAL  2012    Percutaneous extraction of a kidney stone w/ fragmentation procedure         Social History:     Social History     Socioeconomic History   ??? Marital status: MARRIED     Spouse name: Not on file   ??? Number of children: Not on file   ??? Years of education: Not on file   ??? Highest education level: Not on file   Occupational History   ??? Not on file   Social Needs   ??? Financial resource strain: Not on file   ??? Food insecurity     Worry: Not on file     Inability: Not on file   ??? Transportation needs     Medical: Not on file     Non-medical: Not on file   Tobacco Use   ??? Smoking status: Never Smoker   ??? Smokeless tobacco: Never Used   Substance and Sexual Activity   ??? Alcohol use: Not Currently   ??? Drug use: Not Currently   ??? Sexual activity: Not Currently   Lifestyle   ??? Physical activity     Days per week: Not on file     Minutes per session: Not on file   ??? Stress: Not on file   Relationships   ??? Social Product manager on phone: Not on file     Gets together: Not on file     Attends religious service: Not on file     Active member of club or organization: Not on file     Attends meetings of clubs or organizations: Not on file     Relationship status: Not on file   ??? Intimate partner violence     Fear of current or ex partner: Not on file     Emotionally abused: Not on file     Physically abused: Not on file     Forced sexual activity: Not on file   Other Topics Concern    ??? Not on file   Social History Narrative    ** Merged History Encounter **            Family History:     Family History   Problem Relation Age of Onset   ??? Alcohol abuse Father    ??? Heart Disease Brother    ??? Emphysema Brother        Allergies:     Allergies   Allergen Reactions   ??? Other Food Other (comments)     VISTAID  ANESTHESIA   ??? Codeine Itching and Swelling   ??? Oxycodone Anaphylaxis   ??? Hydroxyzine Pamoate Itching and Other (comments)     Other reaction(s): Unknown     ??? Meperidine Itching, Palpitations, Swelling and Other (comments)     Other reaction(s): Unknown     ??? Metoclopramide Itching, Other (comments) and Swelling     Other reaction(s): Other (See Comments)  Reaction unknown   Reaction unknown      ??? Morphine Other (comments) and Nausea and Vomiting   ??? Oxycodone-Acetaminophen Itching   ??? Prochlorperazine Itching, Other (comments) and Unknown (comments)     Other reaction(s): Unknown  Unsure of reaction     ??? Hydroxyzine Hcl Hives         Home Medications:     Medications Prior to Admission   Medication Sig   ??? metoprolol succinate (TOPROL-XL) 25 mg XL tablet TAKE 1 TABLET BY MOUTH EVERY DAY   ??? tamsulosin (Flomax) 0.4 mg capsule Take 2 Caps by mouth daily.   ??? gabapentin (NEURONTIN) 600 mg tablet Take 600 mg by mouth two (2) times a day.   ??? amLODIPine (NORVASC) 5 mg tablet Take 5 mg by mouth daily.   ??? allopurinol (ZYLOPRIM) 300 mg tablet Take  by mouth daily.   ??? aspirin 81 mg chewable tablet Take 81 mg by mouth daily.   ??? testosterone (ANDROGEL) 20.25 mg/1.25 gram (1.62 %) gel Apply 20 mg to affected area daily. Max Daily Amount: 20 mg.   ??? bimatoprost (Lumigan) 0.01 % ophthalmic drops Lumigan 0.01 % eye drops   ??? promethazine (PHENERGAN) 12.5 mg tablet promethazine 12.5 mg tablet   TAKE 1 TABLET BY MOUTH 2 TIMES PER DAY AS NEEDED   ??? testosterone cypionate (DEPOTESTOTERONE CYPIONATE) 200 mg/mL injection INJECT 0.5 MILLILITER INTO THE MUSCLE EVERY 2 WEEKS DISCARD VIAL AFTER USE    ??? ketorolac (TORADOL) 10 mg tablet Take 1 Tab by mouth three (3) times daily as needed for Pain.   ??? lipase-protease-amylase (Creon) 24,000-76,000 -120,000 unit capsule Take  by mouth.   ??? lipase-protease-amylase (Creon) 24,000-76,000 -120,000 unit capsule Take  by mouth daily as needed (with snacks and meals).   ??? temazepam (RESTORIL) 7.5 mg capsule Take  by mouth nightly.   ??? lactobacillus rhamnosus gg 10 billion cell (Culturelle) 10 billion cell capsule Take 1 Cap by mouth Before breakfast and dinner.   ??? peg 400-propylene glycol (Systane, propylene glycol,) 0.4-0.3 % drop Administer 2 Drops to both eyes daily. Patient places two drops in both eyes every AM.   Indications: dry eye   ??? metoprolol tartrate (LOPRESSOR) 25 mg tablet Take 25 mg by mouth daily.   ??? TESTOSTERONE IM by IntraMUSCular route.       Current Medications:     Current Facility-Administered Medications   Medication Dose Route Frequency   ??? promethazine (PHENERGAN) 25 mg in NS IVPB  25 mg IntraVENous Q6H PRN   ??? allopurinoL (ZYLOPRIM) tablet 300 mg  300 mg Oral DAILY   ??? gabapentin (NEURONTIN) capsule 600 mg  600 mg Oral BID   ??? Saccharomyces boulardii (FLORASTOR) capsule 250 mg  250 mg Oral ACB&D   ??? lipase-protease-amylase (CREON 24,000) capsule 1 Cap  1 Cap Oral TID WITH MEALS   ??? metoprolol succinate (TOPROL-XL) XL tablet 25 mg  25 mg Oral DAILY   ??? polyvinyl alcohol-povidon(PF) (REFRESH CLASSIC) 1.4-0.6 % ophthalmic solution 2 Drop  2 Drop Both Eyes DAILY   ??? tamsulosin (FLOMAX) capsule 0.8 mg  0.8 mg Oral DAILY   ??? temazepam (RESTORIL) capsule 7.5 mg  7.5 mg Oral QHS PRN   ??? naloxone (NARCAN) injection 0.1 mg  0.1 mg IntraVENous PRN   ??? cefTRIAXone (ROCEPHIN) 1 g in 0.9% sodium chloride (MBP/ADV) 50 mL MBP  1 g IntraVENous Q24H   ??? ondansetron (ZOFRAN) injection 4 mg  4 mg IntraVENous Q4H PRN   ??? enoxaparin (LOVENOX) injection 40 mg  40 mg SubCUTAneous Q24H   ??? amLODIPine (NORVASC) tablet 10 mg  10 mg Oral DAILY    ??? azithromycin (ZITHROMAX) 500 mg in 0.9% sodium chloride 250 mL IVPB  500 mg  IntraVENous Q24H   ??? hydrALAZINE (APRESOLINE) 20 mg/mL injection 10 mg  10 mg IntraVENous Q6H PRN   ??? 0.9% sodium chloride infusion 250 mL  250 mL IntraVENous PRN   ??? dexamethasone (DECADRON) 4 mg/mL injection 6 mg  6 mg IntraVENous Q24H   ??? remdesivir 100 mg in 0.9% sodium chloride 250 mL IVPB  100 mg IntraVENous QHS   ??? ketorolac (TORADOL) injection 15 mg  15 mg IntraVENous Q8H PRN       Review of Systems:   12 points ROS done.  She states fever has declined since admission no shaking chills or sweats  Appetite is fair, no issues with swallowing  Shortness of breath has improved, coughing but no sputum production  No chest pain or heart racing  No nausea or vomiting, no diarrhea  No rash or itching  Patient denies burning on urination no urgency hesitancy or frequency.  Feels as if he is emptying his bladder completely  All other systems negative    Physical Exam:  Vitals  Temp (24hrs), Avg:98 ??F (36.7 ??C), Min:97.3 ??F (36.3 ??C), Max:98.6 ??F (37 ??C)    Visit Vitals  BP 130/67 (BP 1 Location: Right arm, BP Patient Position: Supine)   Pulse 68   Temp 97.6 ??F (36.4 ??C)   Resp 19   Wt 61.2 kg (135 lb)   SpO2 98%   BMI 20.53 kg/m??       General: No apparent distress, vitals are noted  HEENT: Normocephalic, mucous membranes pink and moist anicteric.    Neck: Supple, no lymphadenopathy, masses or thyromegaly  Chest: Symmetrical expansion  Lungs: Sounds auscultated all lung fields bibasilar crackles greater on the right than left.  No wheezing.  High flow oxygen at 13 L with O2 sat of 91%.  Patient is not dyspneic at rest or tachypneic  Heart: S1-S2 were distinct and regular grade 2/6 systolic murmur  Abdomen: Soft, tender with active bowel sounds  Musculoskeletal: Normal strength/tone. No edema. No clubbing or cyanosis  CNS: Awake and alert, oriented x3.  Smile symmetric tongue midline  SKIN: No rash no jaundice        Labs: Results:    Chemistry Recent Labs     01/13/19  0639 01/12/19  0527 01/12/19  0417 01/12/19  0321 01/10/19  2351   GLU 152*  --   --  184* 147*   NA 144  --   --  142 140   K 4.4  --   --  4.4 3.9   CL 114*  --   --  111* 107   CO2 24 23.0* 23.0* 25 23   BUN 25  --   --  24 12   CREA 1.4*  --   --  1.2 1.2   CA 8.1*  --   --  8.6 8.3*   AGAP 7  --   --  6 9   AP 101  --   --  101 114   TP 6.9  --   --  6.9 7.2   ALB 2.6*  --   --  2.5* 2.5*      CBC w/Diff Recent Labs     01/13/19  0639 01/12/19  0321 01/10/19  2351   WBC 6.8 4.8 2.7*   RBC 4.19 4.26 4.46   HGB 9.5* 9.6* 10.1*   HCT 31.6* 32.1* 33.1*   PLT 169 138* 130*   GRANS 86.0* 82.5* 90.9*   LYMPH 7.7* 11.9*  8.0*   EOS 0.0 0.0  --       Microbiology No results for input(s): CULT in the last 72 hours.       Imaging-    Results from [place] encounter on 01/10/19   XR CHEST SNGL V    Narrative INDICATION: hypoxia, multifocal pneumonia, covid-19 positive. Dyspnea  COMPARISON: 01/10/2019  TECHNIQUE: Single view chest.      Impression IMPRESSION:  The patient is status post sternotomy. Heart is mildly enlarged. Mild vascular  congestion. There are bilateral infiltrates in the mid to lower lung zones,  increased since prior radiograph.         Results from [place] encounter on 06/22/18   CT ABD PELV WO CONT    Narrative CT Abdomen and Pelvis without    Indication: Left hydronephrosis. Evaluate for obstructing stone or lesion.    Comparison: Ultrasound 06/22/2018. CT 05/21/2018.    TECHNIQUE:   CT of the abdomen and pelvis WITHOUT intravenous contrast. Coronal and sagittal  reformations were obtained.        All CT exams at this facility use one or more dose reduction techniques  including automatic exposure control, mA/kV adjustment per patient's size, or  iterative reconstruction technique. DICOM format imaged data is available to  non-affiliated external healthcare facilities or entities on a secure, media   free, reciprocal searchable basis with patient authorization for 12 months  following the date of the study.    DISCUSSION:    ABSENCE OF INTRAVENOUS CONTRAST DECREASES SENSITIVITY FOR DETECTION OF FOCAL  LESIONS AND VASCULAR PATHOLOGY.    LOWER THORAX: Coronary artery calcifications.    HEPATOBILIARY: Multiple subcentimeter foci of hypoattenuation, too small to  characterize but unchanged. Cholecystectomy. Left intrahepatic pneumobilia,  unchanged.  SPLEEN: No splenomegaly.  PANCREAS: Postoperative changes from prior Whipple procedure. No mass or duct  dilatation in the residual pancreatic tail.    ADRENALS: No adrenal nodules.  KIDNEYS/URETERS: Multiple bilateral renal cysts. Multiple small (2 to 4 mm)  nonobstructing bilateral renal calculi. There is a 10 x 9 mm nonobstructing  calculus in the lower pole of the left kidney. Moderate left hydronephrosis and  hydroureter. There is a 8 x 7 mm obstructing calculus in the proximal left  ureter. No right hydronephrosis. No suspicious renal lesions.  PELVIC ORGANS/BLADDER: Enlarged prostate. Penile implant. The bladder is  unremarkable.    PERITONEUM / RETROPERITONEUM: No free air or fluid.  LYMPH NODES: No lymphadenopathy.  VESSELS: Extensive atherosclerotic vascular calcifications. Infrarenal abdominal  aorta ectasia.    GI TRACT: No distention or wall thickening. Diverticulosis. Normal appendix.    BONES AND SOFT TISSUES: Posterior lumbar fixation at L4-5. Mild retrolisthesis  at L2-3 and L3-4. Moderate to advanced disc height loss at L2-3. Mild  spondylosis of the remaining lumbar spine.      Impression IMPRESSION:    1.  Obstructing 8 mm calculus in the left proximal ureter causing moderate left  hydronephrosis.  2.  Multiple additional nonobstructing bilateral renal calculi.  3.  Multiple bilateral renal cysts.  4.  Diverticulosis.  5.  Postoperative changes from prior Whipple procedure.  6.  Multiple hypoattenuating liver lesions, unchanged.          ---------------------------------------------------------------------------------------------------------------  I have independently examined the patient and reviewed all lab studies and imgaing as well as review of nursing notes and physican notes from the past 24 hours. The plan of care has been discussed with the patient/relative and all questions are answered.  Dragon medical dictation software was used for portions of this report. Unintended errors may occur.     Melissa Montane, NP  01/13/2019    [facility]   Office [place]  [phone]

## 2019-01-13 NOTE — Progress Notes (Signed)
Problem: Falls - Risk of  Goal: *Absence of Falls  Description: Document Schmid Fall Risk and appropriate interventions in the flowsheet.  Outcome: Progressing Towards Goal  Note: Fall Risk Interventions:  Mobility Interventions: Assess mobility with egress test, Bed/chair exit alarm, Patient to call before getting OOB         Medication Interventions: Bed/chair exit alarm, Patient to call before getting OOB, Teach patient to arise slowly    Elimination Interventions: Bed/chair exit alarm, Call light in reach, Patient to call for help with toileting needs, Toileting schedule/hourly rounds, Urinal in reach

## 2019-01-13 NOTE — Progress Notes (Signed)
Dyann Ruddle from Radiology called to report they were not successful doing an [place] because the patient is really having Dyspnea on exertion. They will try again tomorrow.

## 2019-01-13 NOTE — Progress Notes (Signed)
Pt O2 between 87-90% supine. Encouraged pt to prone as much as possible. O2 95-100% while prone. Will continue to monitor

## 2019-01-13 NOTE — Progress Notes (Addendum)
[facility]     Name: Tommy Brown MRN: [id number]   DOB: 1945-10-11 Hospital: [facility]   Date: 01/13/2019        IMPRESSION:   ?? Acute hypoxemic respiratory failure due to COVID-19 pneumonitis  ?? COVID-19 pneumonitis  ?? Streptococcal pneumoniae pneumonia, positive strep pneumo urine antigen  ?? Urine culture growing Klebsiella  And E-coli sensitive to cephalosporin  ?? Hypertension  ?? BPH  ?? History of pancreatic cancer  ?? History of left hydronephrosis and stent placement       PLAN:   ?? Continue supplemental oxygen through [place] nasal cannula (15 lit/min).  Titrate, and target oxygen saturation greater than 88%.  ?? Continue proning and deep breathing exercise.  ?? Continue remdesivir for 5 days.  Continue dexamethasone 6 mg IV daily for total 10 days.  S/p convalescent plasma.  ?? Continue azithromycin and ceftriaxone .  Obtain ID consultation in light of positive urine culture and past history of left hydronephrosis and stent placement   ?? Monitor inflammatory markers.  Monitor D-dimer level.  ?? Continue subcu Lovenox for DVT prophylaxis     Subjective/Interval History:         Review of Systems   Constitutional: Negative.    HENT: Negative.    Eyes: Negative.    Respiratory: Positive for shortness of breath.    Cardiovascular: Negative.    Gastrointestinal: Negative.    Genitourinary: Negative.    Musculoskeletal: Negative.    Skin: Negative.    Neurological: Negative.    Endo/Heme/Allergies: Negative.    Psychiatric/Behavioral: Negative.        Objective:   Vital Signs:    Visit Vitals  BP (!) 144/70 (BP 1 Location: Right arm, BP Patient Position: Supine)   Pulse 73   Temp 98.4 ??F (36.9 ??C)   Resp 18   Wt 61.2 kg (135 lb)   SpO2 90%   BMI 20.53 kg/m??       O2 Device: Hi flow nasal cannula(salter)   O2 Flow Rate (L/min): 15 l/min   Temp (24hrs), Avg:98 ??F (36.7 ??C), Min:97.3 ??F (36.3 ??C), Max:98.6 ??F (37 ??C)       Intake/Output:    Last shift:      11/18 0701 - 11/18 1900  In: 120 [P.O.:120]  Out: 350 [Urine:350]  Last 3 shifts: 11/16 1901 - 11/18 0700  In: 2546.3 [P.O.:220; I.V.:2326.3]  Out: 1620 [Urine:1620]    Intake/Output Summary (Last 24 hours) at 01/13/2019 0920  Last data filed at 01/13/2019 0834  Gross per 24 hour   Intake 1400 ml   Output 1070 ml   Net 330 ml        Physical Exam  Vitals signs and nursing note reviewed.   HENT:      Mouth/Throat:      Mouth: Mucous membranes are moist.   Eyes:      Pupils: Pupils are equal, round, and reactive to light.   Cardiovascular:      Rate and Rhythm: Regular rhythm.   Pulmonary:      Breath sounds: No wheezing.   Abdominal:      General: Abdomen is flat.      Palpations: Abdomen is soft.      Tenderness: There is no abdominal tenderness.   Musculoskeletal:         General: No swelling.   Skin:     General: Skin is warm and dry.   Neurological:  General: No focal deficit present.      Mental Status: He is alert.             DATA:  Labs:  Recent Labs     01/13/19  0639 01/12/19  0321 01/10/19  2351   WBC 6.8 4.8 2.7*   HGB 9.5* 9.6* 10.1*   HCT 31.6* 32.1* 33.1*   PLT 169 138* 130*     Recent Labs     01/13/19  0639 01/12/19  0527 01/12/19  0417 01/12/19  0321 01/10/19  2351   NA 144  --   --  142 140   K 4.4  --   --  4.4 3.9   CL 114*  --   --  111* 107   CO2 24 23.0* 23.0* 25 23   GLU 152*  --   --  184* 147*   BUN 25  --   --  24 12   CREA 1.4*  --   --  1.2 1.2   CA 8.1*  --   --  8.6 8.3*   MG 2.2  --   --  2.1 1.9   ALB 2.6*  --   --  2.5* 2.5*   ALT 71  --   --  35 24     Recent Labs     01/12/19  0527 01/12/19  0417   PH 7.468* 7.440   PCO2 30.3* 32.5*   PO2 73.0* 53.0*   HCO3 22.0 22.1   FIO2 80  --           Results     Procedure Component Value Units Date/Time    CULTURE, URINE [id number]  (Abnormal)  (Susceptibility) Collected:  01/10/19 1305    Order Status:  Completed Specimen:  Urine from Clean catch Updated:  01/12/19 1841     Isolate --        >100,000 CFU/mL   Klebsiella pneumoniae ssp pneumoniae       Isolate --        50,000 CFU/mL  Escherichia coli      Susceptibility      Klebsiella pneumoniae ssp pneumoniae     MIC     Ampicillin ($) Resistant     Ampicillin/Sulbactam Susceptible     Cefazolin ($) Susceptible     Cefepime Susceptible     Ceftazidime ($) Susceptible     Ceftriaxone ($) Susceptible     Ciprofloxacin ($) Susceptible     Gentamicin ($) Susceptible     Imipenem Susceptible     Levofloxacin ($) Susceptible     Nitrofurantoin Intermediate     Piperacillin/Tazobactam Susceptible     Tobramycin ($) Susceptible     Trimethoprim/Sulfa Susceptible                Susceptibility      Escherichia coli     MIC     Ampicillin ($) Intermediate     Ampicillin/Sulbactam Susceptible     Cefazolin ($) Susceptible     Cefepime Susceptible     Ceftazidime ($) Susceptible     Ceftriaxone ($) Susceptible     Ciprofloxacin ($) Resistant     Gentamicin ($) Susceptible     Imipenem Susceptible     Levofloxacin ($) Resistant     Nitrofurantoin Susceptible     Piperacillin/Tazobactam Susceptible     Tobramycin ($) Susceptible     Trimethoprim/Sulfa Susceptible  Susceptibility Comments     Klebsiella pneumoniae ssp pneumoniae    See Results    Escherichia coli    See Results             LEGIONELLA PNEUMOPHILA AG, URINE [id number] Collected:  01/10/19 1305    Order Status:  Completed Specimen:  Urine Updated:  01/10/19 1439     Legionella Ag, urine NEGATIVE        Comment: Presumptive negative for L. pneumophila serogroup 1 antigen in urine, suggesting no recent or  current infection. Infection due to Legionella cannot be ruled out since other serogroups and  species may cause disease, antigen may not be present in urine in early infection and the level  of antigen present in the urine may be below the detection limit of the test.         S.Judie Petit, UR/CSF [id number]  (Abnormal) Collected:  01/10/19 1305     Order Status:  Completed Specimen:  Urine Updated:  01/10/19 1438     Strep pneumo Ag, urine POSITIVE       CULTURE, BLOOD [id number] Collected:  01/10/19 1147    Order Status:  Completed Specimen:  Blood Updated:  01/13/19 0704     Blood Culture Result No Growth At 48 Hours       CULTURE, BLOOD [id number] Collected:  01/10/19 1130    Order Status:  Completed Specimen:  Blood Updated:  01/13/19 0704     Blood Culture Result No Growth At 48 Hours       SARS-COV-2_FLU [id number]  (Abnormal) Collected:  01/10/19 1050    Order Status:  Completed Specimen:  NASOPHARYNGEAL SWAB Updated:  01/10/19 1230     COVID-19 POSITIVE        Comment: Negative results do not preclude SARS-CoV-2 infection and should not be used as the sole basis  for treatment or other patient management decisions. Negative results must be combined with  clinical observations, patient history, and epidemiological information.  Testing with the Xpert Xpress SARS-CoV-2 test is intended for use by trained operators who are  proficient in performing tests using either GeneXpert Dx, GeneXpert Infinity and/or [employer]. The Xpert Xpress SARS-CoV-2 test is only for use under the Food and Drug  Administration's Emergency Use Authorization.  THE  pos sars cov2_  RESULT WAS CALLED TO (AND READ BACK BY) MEGAN, ER _.             THE NOTIFICATION WAS MADE ON _11/15/20   BY _BD         CULTURE, RESPIRATORY/SPUTUM/BRONCH [name] [id number]     Order Status:  No result Specimen:  Sputum         XR Results (most recent):  Results from Hospital Encounter encounter on 01/10/19   XR CHEST SNGL V    Narrative INDICATION: hypoxia, multifocal pneumonia, covid-19 positive. Dyspnea  COMPARISON: 01/10/2019  TECHNIQUE: Single view chest.      Impression IMPRESSION:  The patient is status post sternotomy. Heart is mildly enlarged. Mild vascular  congestion. There are bilateral infiltrates in the mid to lower lung zones,   increased since prior radiograph.             Imaging:  [] I have personally reviewed the patient???s radiographs  [] Radiographs reviewed with radiologist   [] No change from prior, tubes and lines in adequate position  [] Improved   [] Worsening        Donavan Foil, MD

## 2019-01-13 NOTE — Progress Notes (Signed)
Problem: Falls - Risk of  Goal: *Absence of Falls  Description: Document Tommy Brown Fall Risk and appropriate interventions in the flowsheet.  Outcome: Progressing Towards Goal  Note: Fall Risk Interventions:  Mobility Interventions: Assess mobility with egress test, Bed/chair exit alarm         Medication Interventions: Bed/chair exit alarm    Elimination Interventions: Bed/chair exit alarm, Call light in reach              Problem: Risk for Spread of Infection  Goal: Prevent transmission of infectious organism to others  Description: Prevent the transmission of infectious organisms to other patients, staff members, and visitors.  Outcome: Progressing Towards Goal     Problem: Gas Exchange - Impaired  Goal: *Absence of hypoxia  Outcome: Progressing Towards Goal

## 2019-01-13 NOTE — Progress Notes (Signed)
Bedside and Verbal shift change report given to Lilian (RN) (oncoming nurse) by Deneshea (RN) (offgoing nurse). Report included the following information SBAR, Kardex, Intake/Output and MAR.

## 2019-01-13 NOTE — Other (Signed)
Bedside and Verbal shift change report given to Neal Dy, RN   (oncoming nurse) by Claiborne Billings, RN (offgoing nurse). Report included the following information SBAR.

## 2019-01-13 NOTE — Progress Notes (Signed)
Medical Progress Note      NAME: Tommy Brown   DOB:  [birth year]  MRN:             [id number]    Date:  01/13/2019        Assessment:     1. Acute hypoxic respiratory failure  2. COVID-19 infection  3. Multifocal pneumonia  4. Pancytopenia  5. UTI with Klebsiella pneumoniae  6. Hypertension  7. CAD status post CABG  8. Gout  9. Pancreatic cancer  10. Neuropathy    Plan:     ?? Continue to monitor respiratory status and continue supplemental oxygen, titrate and wean as tolerated   ?? Patient advised from positioning myself he can   ?? Status post convalescent plasma   ?? Patient receiving remdesivir, last dose will be tomorrow  ?? Continue dexamethasone 6 mg daily for total 10 days  ?? Continue current IV antibiotics with ceftriaxone and Zithromax  ?? Pulmonary is following  ?? ID is also consulted today  ?? Discussed with the patient   ?? Discussed with and updated his wife    Subjective:     Shortness of breathing, denies fever    Objective:     Vitals:    Last 24hrs VS reviewed since prior progress note. Most recent are:  Visit Vitals  BP 130/67 (BP 1 Location: Right arm, BP Patient Position: Supine)   Pulse 68   Temp 97.6 ??F (36.4 ??C)   Resp 19   Wt 61.2 kg (135 lb)   SpO2 91%   BMI 20.53 kg/m??     SpO2 Readings from Last 6 Encounters:   01/13/19 91%   07/31/18 98%   07/17/18 99%   06/26/18 97%   01/21/18 96%    O2 Flow Rate (L/min): 13 l/min       Intake/Output Summary (Last 24 hours) at 01/13/2019 1341  Last data filed at 01/13/2019 0834  Gross per 24 hour   Intake 788.75 ml   Output 1070 ml   Net -281.25 ml        Exam:     General:   Not in acute distress  HEENT: PERRLA, Neck Supple,  No JVD  Respiratory:   CTA bilaterally-no wheezes, rales, rhonchi, or crackles  Cardiac:  Regular Rate and Rythmn  - no murmurs, rubs or gallops  Abdominal:  Soft, non-tender, non-distended, positive bowel sounds  Extremities:  No cyanosis, or edema.  Skin: No rash  Neurological:  No focal neurological deficits     Medication:   Current Medications Reviewed    Current Facility-Administered Medications:   ???  promethazine (PHENERGAN) 25 mg in NS IVPB, 25 mg, IntraVENous, Q6H PRN, Stanton Kidney, MD, Last Rate: 200 mL/hr at 01/11/19 1955, 25 mg at 01/11/19 1955  ???  allopurinoL (ZYLOPRIM) tablet 300 mg, 300 mg, Oral, DAILY, Priscillia Fouch, Tami Ribas, MD, 300 mg at 01/13/19 3664  ???  gabapentin (NEURONTIN) capsule 600 mg, 600 mg, Oral, BID, Stanton Kidney, MD, 600 mg at 01/13/19 4034  ???  Saccharomyces boulardii (FLORASTOR) capsule 250 mg, 250 mg, Oral, ACB&D, Stanton Kidney, MD, 250 mg at 01/13/19 0631  ???  lipase-protease-amylase (CREON 24,000) capsule 1 Cap, 1 Cap, Oral, TID WITH MEALS, Stanton Kidney, MD, 1 Cap at 01/13/19 7425  ???  metoprolol succinate (TOPROL-XL) XL tablet 25 mg, 25 mg, Oral, DAILY, Stanton Kidney, MD, 25 mg at 01/13/19 9563  ???  polyvinyl alcohol-povidon(PF) (REFRESH CLASSIC) 1.4-0.6 % ophthalmic  solution 2 Drop, 2 Drop, Both Eyes, DAILY, Marquasia Schmieder, Tami Ribas, MD, 2 Drop at 01/13/19 0927  ???  tamsulosin (FLOMAX) capsule 0.8 mg, 0.8 mg, Oral, DAILY, Styles Fambro, Tami Ribas, MD, 0.8 mg at 01/13/19 9767  ???  temazepam (RESTORIL) capsule 7.5 mg, 7.5 mg, Oral, QHS PRN, Stanton Kidney, MD, 7.5 mg at 01/12/19 2244  ???  naloxone (NARCAN) injection 0.1 mg, 0.1 mg, IntraVENous, PRN, Delene Loll, Tami Ribas, MD  ???  cefTRIAXone (ROCEPHIN) 1 g in 0.9% sodium chloride (MBP/ADV) 50 mL MBP, 1 g, IntraVENous, Q24H, Marky Buresh, Tami Ribas, MD, Last Rate: 100 mL/hr at 01/12/19 1648, 1 g at 01/12/19 1648  ???  ondansetron (ZOFRAN) injection 4 mg, 4 mg, IntraVENous, Q4H PRN, Delene Loll, Tami Ribas, MD  ???  enoxaparin (LOVENOX) injection 40 mg, 40 mg, SubCUTAneous, Q24H, Rosena Bartle, Tami Ribas, MD, 40 mg at 01/12/19 1648  ???  amLODIPine (NORVASC) tablet 10 mg, 10 mg, Oral, DAILY, Latiya Navia, Tami Ribas, MD, 10 mg at 01/13/19 0926   ???  azithromycin (ZITHROMAX) 500 mg in 0.9% sodium chloride 250 mL IVPB, 500 mg, IntraVENous, Q24H, Ernst Cumpston, Tami Ribas, MD, Last Rate: 250 mL/hr at 01/12/19 1959, 500 mg at 01/12/19 1959  ???  hydrALAZINE (APRESOLINE) 20 mg/mL injection 10 mg, 10 mg, IntraVENous, Q6H PRN, Demonte Dobratz, Tami Ribas, MD  ???  0.9% sodium chloride infusion 250 mL, 250 mL, IntraVENous, PRN, Delene Loll, Tami Ribas, MD  ???  dexamethasone (DECADRON) 4 mg/mL injection 6 mg, 6 mg, IntraVENous, Q24H, Carlyn Mullenbach, Tami Ribas, MD, 6 mg at 01/12/19 1647  ???  [COMPLETED] remdesivir 200 mg in 0.9% sodium chloride 250 mL IVPB, 200 mg, IntraVENous, ONCE, Stopped at 01/10/19 1855 **FOLLOWED BY** remdesivir 100 mg in 0.9% sodium chloride 250 mL IVPB, 100 mg, IntraVENous, QHS, Nayanna Seaborn, Tami Ribas, MD, 100 mg at 01/12/19 2113  ???  ketorolac (TORADOL) injection 15 mg, 15 mg, IntraVENous, Q8H PRN, Stanton Kidney, MD, 15 mg at 01/12/19 3419      Lab:     Lab Data Reviewed: (see below)  Recent Results (from the past 24 hour(s))   CBC WITH AUTOMATED DIFF    Collection Time: 01/13/19  6:39 AM   Result Value Ref Range    WBC 6.8 4.0 - 11.0 1000/mm3    RBC 4.19 3.80 - 5.70 M/uL    HGB 9.5 (L) 12.4 - 17.2 gm/dl    HCT 31.6 (L) 37.0 - 50.0 %    MCV 75.4 (L) 80.0 - 98.0 fL    MCH 22.7 (L) 23.0 - 34.6 pg    MCHC 30.1 30.0 - 36.0 gm/dl    PLATELET 169 140 - 450 1000/mm3    RDW-SD 59.5 (H) 35.1 - 43.9      NRBC 0 0 - 0      IMMATURE GRANULOCYTES 0.7 0.0 - 3.0 %    NEUTROPHILS 86.0 (H) 34 - 64 %    LYMPHOCYTES 7.7 (L) 28 - 48 %    MONOCYTES 5.5 1 - 13 %    EOSINOPHILS 0.0 0 - 5 %    BASOPHILS 0.1 0 - 3 %   METABOLIC PANEL, COMPREHENSIVE    Collection Time: 01/13/19  6:39 AM   Result Value Ref Range    Sodium 144 136 - 145 mEq/L    Potassium 4.4 3.5 - 5.1 mEq/L    Chloride 114 (H) 98 - 107 mEq/L    CO2 24 21 - 32 mEq/L    Glucose 152 (H) 74 - 106 mg/dl  BUN 25 7 - 25 mg/dl    Creatinine 1.4 (H) 0.6 - 1.3 mg/dl    GFR est AA >60      GFR est non-AA 53      Calcium 8.1 (L) 8.5 - 10.1 mg/dl     AST (SGOT) 122 (H) 15 - 37 U/L    ALT (SGPT) 71 12 - 78 U/L    Alk. phosphatase 101 45 - 117 U/L    Bilirubin, total 0.6 0.2 - 1.0 mg/dl    Protein, total 6.9 6.4 - 8.2 gm/dl    Albumin 2.6 (L) 3.4 - 5.0 gm/dl    Anion gap 7 5 - 15 mmol/L   MAGNESIUM    Collection Time: 01/13/19  6:39 AM   Result Value Ref Range    Magnesium 2.2 1.6 - 2.6 mg/dl   D DIMER    Collection Time: 01/13/19  9:48 AM   Result Value Ref Range    D DIMER 0.70 (H) 0.01 - 0.50 ug/mL (FEU)   FERRITIN    Collection Time: 01/13/19  9:48 AM   Result Value Ref Range    Ferritin 185.3 26.0 - 388.0 ng/ml   C REACTIVE PROTEIN, QT    Collection Time: 01/13/19  9:48 AM   Result Value Ref Range    C-Reactive protein 29.6 (H) 0.0 - 2.9 mg/L       No results found.    Active Problems:    Pneumonia due to COVID-19 virus (01/10/2019)      ____________________________________________________________________      Attending Physician: Stanton Kidney, MD       Dragon medical dictation software was used for portions of this report. Unintended voice recognition errors may occur.

## 2019-01-13 NOTE — Progress Notes (Signed)
Problem: Falls - Risk of  Goal: *Absence of Falls  Description: Document Tommy Brown Fall Risk and appropriate interventions in the flowsheet.  Outcome: Progressing Towards Goal  Note: Fall Risk Interventions:  Mobility Interventions: Assess mobility with egress test, Bed/chair exit alarm, Patient to call before getting OOB    Medication Interventions: Bed/chair exit alarm, Patient to call before getting OOB, Teach patient to arise slowly    Elimination Interventions: Bed/chair exit alarm, Call light in reach, Patient to call for help with toileting needs, Toileting schedule/hourly rounds, Urinal in reach       Problem: Risk for Spread of Infection  Goal: Prevent transmission of infectious organism to others  Description: Prevent the transmission of infectious organisms to other patients, staff members, and visitors.  Outcome: Progressing Towards Goal     Problem: Gas Exchange - Impaired  Goal: *Absence of hypoxia  Outcome: Progressing Towards Goal     Problem: Pressure Injury - Risk of  Goal: *Prevention of pressure injury  Description: Document Braden Scale and appropriate interventions in the flowsheet.  Outcome: Progressing Towards Goal  Note: Pressure Injury Interventions:     Moisture Interventions: Absorbent underpads, Minimize layers, Moisture barrier    Activity Interventions: Increase time out of bed, Pressure redistribution bed/mattress(bed type), PT/OT evaluation    Mobility Interventions: HOB 30 degrees or less, Pressure redistribution bed/mattress (bed type), PT/OT evaluation    Nutrition Interventions: Document food/fluid/supplement intake, Discuss nutritional consult with provider         Problem: Patient Education: Go to Patient Education Activity  Goal: Patient/Family Education  Outcome: Progressing Towards Goal

## 2019-01-13 NOTE — Progress Notes (Signed)
Pt couldn't sleep prone so he repositioned supine, fell asleep and O2 is staying consistent at 89%. Will continue to monitor

## 2019-01-13 NOTE — Consults (Signed)
Consults by [name], MD at 01/13/19 1239                Author: [name], MD  Service: Infectious Disease  Author Type: Physician       Filed: 01/13/19 1552  Date of Service: 01/13/19 1239  Status: Addendum          Editor: [name], MD (Physician)          Related Notes: Original Note by Melissa Montane, NP (Nurse Practitioner) filed at 01/13/19  1329                               INFECTIOUS DISEASE CONSULT NOTE            Infectious Disease Attending      I have reviewed Tommy Brown's  chart. I saw and examined the patient, personally performed the key portions of the service and discussed  the management with the treatment team. I reviewed the NP's note and concur with the history, findings and plan of care and my edits as needed. Laboratory, hemodynamic and radiological data were independently reviewed.       -Tommy Brown is a 73 year old male with past medical history of hypertension, CAD, prostate cancer, hydronephrosis requiring stent was admitted for shortness of breath, positive COVID-19 and multifocal  pneumonia      -Patient with multifocal pneumonia on chest x-ray   -Positive for COVID-19   -patient also with positive strep pneumo antigen   -We will need to check sputum culture   -As patient also hypoxic, agree with IV remdesivir, steroids and convalescent plasma   -Continue on broad-spectrum antibiotics with ceftriaxone and azithromycin pending further results   -Watch for any worsening   -Inflammatory markers mildly elevated D-dimer of 0.7      -Patient's urine culture with Klebsiella and E. Coli   -Given presence of stent, will need renal ultrasound   -Monitor labs and cultures         [name], MD              Requested by: [name]      Reason for consult: Strep pneumonia and UTI      Date of admission: 01/10/2019      Date of consult: January 13, 2019           ABX:           Current abx  Prior abx         Zithromax 11/15-3   Rocephin 11/15-3      Decadron 11/15-3    Remdesivir 11/15-3   Convalescent plasma 11/15            ASSESSMENT:           Pneumonia--Strep Pneumonia Urine ag +     Viral pneumonia--COVID-19 positive     UTI   UA unrevealing   Urine culture positive clubs and E. coli     History of left hydronephrosis due to renal calculi   06/22/18 JJ stent placement   07/31/2018 cystoscopy left retrograde pyelogram left ureteroscopy laser lithotripsy proximal ureteral stone and extraction of left renal calculi and exchange of double-J stents   08/06/2018 during stent removed       CAD s/p CABG       Pancreatic cancer   2006 for procedure, radiation, chemo     Gout     Hypogonadism  RECOMMENDATIONS:     Complicated 73 year old gentleman who presented with fever shortness of breath found to be Covid positive as well as strep pneumonia positive.  UC +      -Continue ceftriaxone for strep pneumonia   -Patient history with hydronephrosis due to obstructing stone requiring stents ultimately laser lithotripsy, stent now out   -Patient asymptomatic, UA negative nitrite, negative leukocyte esterase and no white blood cells so doubt true urinary tract infection however given history, will order ultrasound kidney ureters and bladder.  Urine cx currently covered with Ceftriaxone.    -Post void residual   -Monitor and trend inflammatory markers   -prone as tolerated   -IS every 2 hours   -Flutter valve   -Decadron, Remdesivir as ordered by primary team   -Monitor renal and liver function closely while on treatment    -AST increasing, Cr up    -s/p CP   -Monitor culture data   -Monitor labs      60  Minutes spent  reviewing EMR, cultures, labs interviewing and examining with greater than 50% of time spent on education answering questions and coordinating care.                    COVID-19 Lab test monitoring:     Blood type: A Rh Negative   Pregnancy:   Quantiferon TB gold:         Labs:     Recent Labs            01/12/19   0527  01/12/19   0417     PTEMPI  98.0 F  98.0  F     O2ST  96.0  89.0*         FIO2  80   --           Recent Labs              01/13/19   0639  01/12/19   0321  01/10/19   2351  01/10/19   1711     WBC  6.8  4.8  2.7*  3.4*     CREA  1.4*  1.2  1.2  1.2     PLT  169  138*  130*  128*     AST  122*  72*  54*  64*           ALT  71  35  24  27          Recent Labs            01/13/19   0948  01/12/19   0321     CRP  29.6*  81.2*     FERR  185.3  190.8     LDH   --   337*     DDIME  0.70*  0.52*     FIBRN   --   443*         TRIGL   --   105                               MICROBIOLOGY:     11/15  BC x2 NGTD    UC Klebsiella pneumonia and E. Coli    Strep pneumonia urine antigen positive    Legionella urine antigen negative    19+        LINES AND CATHETERS:     PIV  HPI:     Tommy Brown is a 73 year old gentleman with past medical history significant for hypertension CAD status post CABG, gout, pancreatic cancer (2006 with Whipple procedure, radiation  and chemo) and left hydronephrosis status post stent placement.  Has history of 8 mm proximal ureteral stone with hydroureteronephrosis and underwent cystoscopy and left JJ stent placement on 06/22/18 and ultimately underwent a cystoscopy, left retrograde  pyelogram left ureteroscopy with laser lithotripsy of the proximal ureteral stone and extraction of left renal calculi and exchange of double-J stents on 07/31/2018.  Follow-up KUB was done on 08/06/2018 which revealed left ureteral stent in good position,  no further ureteral renal stones noted therefore stent was removed.  He developed low-grade fevers and flulike symptoms for approximately 1 week and became concerned when he developed shortness of breath.  He called 911 was brought to [facility] ER where he  was found to be COVID-19 positive.  X-ray showed multifocal patchy bilateral airspace opacities concerning for multifocal pneumonia.  WBC on admission was 2.7 Hgb 10.1 HCT 33.1 PLT 130.  Urine culture was sent and is positive for Klebsiella pneumonia  and E.  coli.  Additionally urine antigen for strep pneumonia has returned positive.  ID has been consulted for further management for UTI and pneumonia.        Past medical history:          Past Medical History:        Diagnosis  Date         ?  Arthritis       ?  Benign prostate hyperplasia       ?  Bladder infection       ?  Chronic kidney disease       ?  Coronary arteriosclerosis       ?  Coronary atherosclerosis of artery bypass graft       ?  Decreased testosterone level       ?  Diverticular disease       ?  Essential hypertension       ?  Gout       ?  History of acute renal failure       ?  History of anemia       ?  History of malignant neoplasm of pancreas       ?  Hx of CABG       ?  Kidney stone       ?  Kidney stones       ?  Neuropathy       ?  Pancreatic cancer ([place])       ?  Sepsis ([place])           ?  Tremor               Past Surgical History:         Procedure  Laterality  Date          ?  HX CERVICAL FUSION         ?  HX COLONOSCOPY         ?  HX CORONARY ARTERY BYPASS GRAFT    1995     ?  HX HEART CATHETERIZATION    2006     ?  HX ORTHOPAEDIC              back          ?  HX OTHER SURGICAL    2006  whipple procedure done for pancreatic cancer          ?  HX UROLOGICAL    2012          Percutaneous extraction of a kidney stone w/ fragmentation procedure               Social History:          Social History          Socioeconomic History         ?  Marital status:  MARRIED              Spouse name:  Not on file         ?  Number of children:  Not on file     ?  Years of education:  Not on file     ?  Highest education level:  Not on file       Occupational History        ?  Not on file       Social Needs         ?  Financial resource strain:  Not on file        ?  Food insecurity              Worry:  Not on file         Inability:  Not on file        ?  Transportation needs              Medical:  Not on file         Non-medical:  Not on file       Tobacco Use         ?  Smoking status:  Never  Smoker     ?  Smokeless tobacco:  Never Used       Substance and Sexual Activity         ?  Alcohol use:  Not Currently     ?  Drug use:  Not Currently     ?  Sexual activity:  Not Currently       Lifestyle        ?  Physical activity              Days per week:  Not on file         Minutes per session:  Not on file         ?  Stress:  Not on file       Relationships        ?  Social Health visitor on phone:  Not on file         Gets together:  Not on file         Attends religious service:  Not on file         Active member of club or organization:  Not on file         Attends meetings of clubs or organizations:  Not on file         Relationship status:  Not on file        ?  Intimate partner violence              Fear of current or ex partner:  Not on file         Emotionally abused:  Not on file  Physically abused:  Not on file         Forced sexual activity:  Not on file        Other Topics  Concern        ?  Not on file       Social History Narrative          ** Merged History Encounter **                        Family History:          Family History         Problem  Relation  Age of Onset          ?  Alcohol abuse  Father       ?  Heart Disease  Brother            ?  Emphysema  Brother               Allergies:          Allergies        Allergen  Reactions         ?  Other Food  Other (comments)             VISTAID   ANESTHESIA         ?  Codeine  Itching and Swelling     ?  Oxycodone  Anaphylaxis     ?  Hydroxyzine Pamoate  Itching and Other (comments)             Other reaction(s): Unknown            ?  Meperidine  Itching, Palpitations, Swelling and Other (comments)             Other reaction(s): Unknown            ?  Metoclopramide  Itching, Other (comments) and Swelling             Other reaction(s): Other (See Comments)   Reaction unknown    Reaction unknown             ?  Morphine  Other (comments) and Nausea and Vomiting     ?  Oxycodone-Acetaminophen  Itching     ?   Prochlorperazine  Itching, Other (comments) and Unknown (comments)             Other reaction(s): Unknown   Unsure of reaction            ?  Hydroxyzine Hcl  Hives                Home Medications:          Medications Prior to Admission        Medication  Sig         ?  metoprolol succinate (TOPROL-XL) 25 mg XL tablet  TAKE 1 TABLET BY MOUTH EVERY DAY     ?  tamsulosin (Flomax) 0.4 mg capsule  Take 2 Caps by mouth daily.     ?  gabapentin (NEURONTIN) 600 mg tablet  Take 600 mg by mouth two (2) times a day.     ?  amLODIPine (NORVASC) 5 mg tablet  Take 5 mg by mouth daily.     ?  allopurinol (ZYLOPRIM) 300 mg tablet  Take  by mouth daily.     ?  aspirin 81 mg chewable tablet  Take 81 mg by  mouth daily.         ?  testosterone (ANDROGEL) 20.25 mg/1.25 gram (1.62 %) gel  Apply 20 mg to affected area daily. Max Daily Amount: 20 mg.         ?  bimatoprost (Lumigan) 0.01 % ophthalmic drops  Lumigan 0.01 % eye drops     ?  promethazine (PHENERGAN) 12.5 mg tablet  promethazine 12.5 mg tablet    TAKE 1 TABLET BY MOUTH 2 TIMES PER DAY AS NEEDED     ?  testosterone cypionate (DEPOTESTOTERONE CYPIONATE) 200 mg/mL injection  INJECT 0.5 MILLILITER INTO THE MUSCLE EVERY 2 WEEKS DISCARD VIAL AFTER USE     ?  ketorolac (TORADOL) 10 mg tablet  Take 1 Tab by mouth three (3) times daily as needed for Pain.     ?  lipase-protease-amylase (Creon) 24,000-76,000 -120,000 unit capsule  Take  by mouth.     ?  lipase-protease-amylase (Creon) 24,000-76,000 -120,000 unit capsule  Take  by mouth daily as needed (with snacks and meals).     ?  temazepam (RESTORIL) 7.5 mg capsule  Take  by mouth nightly.     ?  lactobacillus rhamnosus gg 10 billion cell (Culturelle) 10 billion cell capsule  Take 1 Cap by mouth Before breakfast and dinner.     ?  peg 400-propylene glycol (Systane, propylene glycol,) 0.4-0.3 % drop  Administer 2 Drops to both eyes daily. Patient places two drops in both eyes every AM.   Indications: dry eye     ?  metoprolol  tartrate (LOPRESSOR) 25 mg tablet  Take 25 mg by mouth daily.         ?  TESTOSTERONE IM  by IntraMUSCular route.             Current Medications:          Current Facility-Administered Medications          Medication  Dose  Route  Frequency           ?  promethazine (PHENERGAN) 25 mg in NS IVPB   25 mg  IntraVENous  Q6H PRN     ?  allopurinoL (ZYLOPRIM) tablet 300 mg   300 mg  Oral  DAILY     ?  gabapentin (NEURONTIN) capsule 600 mg   600 mg  Oral  BID     ?  Saccharomyces boulardii (FLORASTOR) capsule 250 mg   250 mg  Oral  ACB&D     ?  lipase-protease-amylase (CREON 24,000) capsule 1 Cap   1 Cap  Oral  TID WITH MEALS           ?  metoprolol succinate (TOPROL-XL) XL tablet 25 mg   25 mg  Oral  DAILY           ?  polyvinyl alcohol-povidon(PF) (REFRESH CLASSIC) 1.4-0.6 % ophthalmic solution 2 Drop   2 Drop  Both Eyes  DAILY     ?  tamsulosin (FLOMAX) capsule 0.8 mg   0.8 mg  Oral  DAILY     ?  temazepam (RESTORIL) capsule 7.5 mg   7.5 mg  Oral  QHS PRN     ?  naloxone (NARCAN) injection 0.1 mg   0.1 mg  IntraVENous  PRN     ?  cefTRIAXone (ROCEPHIN) 1 g in 0.9% sodium chloride (MBP/ADV) 50 mL MBP   1 g  IntraVENous  Q24H     ?  ondansetron (ZOFRAN) injection 4 mg  4 mg  IntraVENous  Q4H PRN     ?  enoxaparin (LOVENOX) injection 40 mg   40 mg  SubCUTAneous  Q24H     ?  amLODIPine (NORVASC) tablet 10 mg   10 mg  Oral  DAILY     ?  azithromycin (ZITHROMAX) 500 mg in 0.9% sodium chloride 250 mL IVPB   500 mg  IntraVENous  Q24H     ?  hydrALAZINE (APRESOLINE) 20 mg/mL injection 10 mg   10 mg  IntraVENous  Q6H PRN     ?  0.9% sodium chloride infusion 250 mL   250 mL  IntraVENous  PRN     ?  dexamethasone (DECADRON) 4 mg/mL injection 6 mg   6 mg  IntraVENous  Q24H     ?  remdesivir 100 mg in 0.9% sodium chloride 250 mL IVPB   100 mg  IntraVENous  QHS           ?  ketorolac (TORADOL) injection 15 mg   15 mg  IntraVENous  Q8H PRN             Review of Systems:     12 points ROS done.  She states fever has declined since  admission no shaking chills or sweats   Appetite is fair, no issues with swallowing   Shortness of breath has improved, coughing but no sputum production   No chest pain or heart racing   No nausea or vomiting, no diarrhea   No rash or itching   Patient denies burning on urination no urgency hesitancy or frequency.  Feels as if he is emptying his bladder completely   All other systems negative      Physical Exam:   Vitals   Temp (24hrs), Avg:98 ??F (36.7 ??C), Min:97.3 ??F (36.3 ??C), Max:98.6 ??F (37 ??C)      Visit Vitals      BP  130/67 (BP 1 Location: Right arm, BP Patient Position: Supine)     Pulse  68     Temp  97.6 ??F (36.4 ??C)     Resp  19     Wt  61.2 kg (135 lb)     SpO2  98%        BMI  20.53 kg/m??           General: No apparent distress, vitals are noted   HEENT: Normocephalic, mucous membranes pink and moist anicteric.     Neck: Supple, no lymphadenopathy, masses or thyromegaly   Chest: Symmetrical expansion   Lungs: Sounds auscultated all lung fields bibasilar crackles greater on the right than left.  No wheezing.  High flow oxygen at 13 L with O2 sat of 91%.  Patient is not dyspneic at rest or tachypneic   Heart: S1-S2 were distinct and regular grade 2/6 systolic murmur   Abdomen: Soft, tender with active bowel sounds   Musculoskeletal: Normal strength/tone. No edema. No clubbing or cyanosis   CNS: Awake and alert, oriented x3.  Smile symmetric tongue midline   SKIN: No rash no jaundice               Labs:  Results:        Chemistry  Recent Labs         01/13/19   0639  01/12/19   0527  01/12/19   0417  01/12/19   0321  01/10/19   2351      GLU  152*   --    --  184*  147*      NA  144   --    --   142  140      K  4.4   --    --   4.4  3.9      CL  114*   --    --   111*  107      CO2  24  23.0*  23.0*  25  23      BUN  25   --    --   24  12      CREA  1.4*   --    --   1.2  1.2      CA  8.1*   --    --   8.6  8.3*      AGAP  7   --    --   6  9      AP  101   --    --   101  114      TP  6.9   --    --    6.9  7.2      ALB  2.6*   --    --   2.5*  2.5*                 CBC w/Diff  Recent Labs         01/13/19   0639  01/12/19   0321  01/10/19   2351      WBC  6.8  4.8  2.7*      RBC  4.19  4.26  4.46      HGB  9.5*  9.6*  10.1*      HCT  31.6*  32.1*  33.1*      PLT  169  138*  130*      GRANS  86.0*  82.5*  90.9*      LYMPH  7.7*  11.9*  8.0*      EOS  0.0  0.0   --                  Microbiology  No results for input(s): CULT in the last 72 hours.           Imaging-        Results from [facility] encounter on 01/10/19     XR CHEST SNGL V           Narrative  INDICATION: hypoxia, multifocal pneumonia, covid-19 positive. Dyspnea   COMPARISON: 01/10/2019   TECHNIQUE: Single view chest.              Impression  IMPRESSION:   The patient is status post sternotomy. Heart is mildly enlarged. Mild vascular   congestion. There are bilateral infiltrates in the mid to lower lung zones,   increased since prior radiograph.                Results from [facility] encounter on 06/22/18     CT ABD PELV WO CONT           Narrative  CT Abdomen and Pelvis without      Indication: Left hydronephrosis. Evaluate for obstructing stone or lesion.      Comparison: Ultrasound 06/22/2018. CT 05/21/2018.      TECHNIQUE:    CT of the abdomen and pelvis WITHOUT intravenous contrast. Coronal and sagittal   reformations were obtained.  All CT exams at this facility use one or more dose reduction techniques   including automatic exposure control, mA/kV adjustment per patient's size, or   iterative reconstruction technique. DICOM format imaged data is available to   non-affiliated external healthcare facilities or entities on a secure, media   free, reciprocal searchable basis with patient authorization for 12 months   following the date of the study.      DISCUSSION:      ABSENCE OF INTRAVENOUS CONTRAST DECREASES SENSITIVITY FOR DETECTION OF FOCAL   LESIONS AND VASCULAR PATHOLOGY.      LOWER THORAX: Coronary artery  calcifications.      HEPATOBILIARY: Multiple subcentimeter foci of hypoattenuation, too small to   characterize but unchanged. Cholecystectomy. Left intrahepatic pneumobilia,   unchanged.   SPLEEN: No splenomegaly.   PANCREAS: Postoperative changes from prior Whipple procedure. No mass or duct   dilatation in the residual pancreatic tail.      ADRENALS: No adrenal nodules.   KIDNEYS/URETERS: Multiple bilateral renal cysts. Multiple small (2 to 4 mm)   nonobstructing bilateral renal calculi. There is a 10 x 9 mm nonobstructing   calculus in the lower pole of the left kidney. Moderate left hydronephrosis and   hydroureter. There is a 8 x 7 mm obstructing calculus in the proximal left   ureter. No right hydronephrosis. No suspicious renal lesions.   PELVIC ORGANS/BLADDER: Enlarged prostate. Penile implant. The bladder is   unremarkable.      PERITONEUM / RETROPERITONEUM: No free air or fluid.   LYMPH NODES: No lymphadenopathy.   VESSELS: Extensive atherosclerotic vascular calcifications. Infrarenal abdominal   aorta ectasia.      GI TRACT: No distention or wall thickening. Diverticulosis. Normal appendix.      BONES AND SOFT TISSUES: Posterior lumbar fixation at L4-5. Mild retrolisthesis   at L2-3 and L3-4. Moderate to advanced disc height loss at L2-3. Mild   spondylosis of the remaining lumbar spine.              Impression  IMPRESSION:      1.  Obstructing 8 mm calculus in the left proximal ureter causing moderate left   hydronephrosis.   2.  Multiple additional nonobstructing bilateral renal calculi.   3.  Multiple bilateral renal cysts.   4.  Diverticulosis.   5.  Postoperative changes from prior Whipple procedure.   6.  Multiple hypoattenuating liver lesions, unchanged.              ---------------------------------------------------------------------------------------------------------------   I have independently examined the patient and reviewed all lab studies and imgaing as well as review of nursing notes  and physican notes from the past 24 hours. The plan of care has been discussed with  the patient/relative and all questions are answered.       Dragon medical dictation software was used for portions of this report. Unintended errors may occur.       Melissa Montane, NP   01/13/2019      [facility]    Office [place]   [phone]

## 2019-01-13 NOTE — Progress Notes (Signed)
Medical Progress Note      NAME: Tommy Brown   DOB:  Jun 14, 1945  MRN:             [id number]    Date:  01/13/2019        Assessment:     1. Acute hypoxic respiratory failure  2. COVID-19 infection  3. Multifocal pneumonia  4. Pancytopenia  5. UTI with Klebsiella pneumoniae  6. Hypertension  7. CAD status post CABG  8. Gout  9. Pancreatic cancer  10. Neuropathy    Plan:     ?? Continue to monitor respiratory status and continue supplemental oxygen, titrate and wean as tolerated   ?? Patient advised from positioning myself he can   ?? Status post convalescent plasma   ?? Patient receiving remdesivir, last dose will be tomorrow  ?? Continue dexamethasone 6 mg daily for total 10 days  ?? Continue current IV antibiotics with ceftriaxone and Zithromax  ?? Pulmonary is following  ?? ID is also consulted today  ?? Discussed with the patient   ?? Discussed with and updated his wife    Subjective:     Shortness of breathing, denies fever    Objective:     Vitals:    Last 24hrs VS reviewed since prior progress note. Most recent are:  Visit Vitals  BP 130/67 (BP 1 Location: Right arm, BP Patient Position: Supine)   Pulse 68   Temp 97.6 ??F (36.4 ??C)   Resp 19   Wt 61.2 kg (135 lb)   SpO2 91%   BMI 20.53 kg/m??     SpO2 Readings from Last 6 Encounters:   01/13/19 91%   07/31/18 98%   07/17/18 99%   06/26/18 97%   01/21/18 96%    O2 Flow Rate (L/min): 13 l/min       Intake/Output Summary (Last 24 hours) at 01/13/2019 1341  Last data filed at 01/13/2019 0834  Gross per 24 hour   Intake 788.75 ml   Output 1070 ml   Net -281.25 ml        Exam:     General:   Not in acute distress  HEENT: PERRLA, Neck Supple,  No JVD  Respiratory:   CTA bilaterally-no wheezes, rales, rhonchi, or crackles  Cardiac:  Regular Rate and Rythmn  - no murmurs, rubs or gallops  Abdominal:  Soft, non-tender, non-distended, positive bowel sounds  Extremities:  No cyanosis, or edema.  Skin: No rash  Neurological:  No focal neurological  deficits    Medication:   Current Medications Reviewed    Current Facility-Administered Medications:   ???  promethazine (PHENERGAN) 25 mg in NS IVPB, 25 mg, IntraVENous, Q6H PRN, Stanton Kidney, MD, Last Rate: 200 mL/hr at 01/11/19 1955, 25 mg at 01/11/19 1955  ???  allopurinoL (ZYLOPRIM) tablet 300 mg, 300 mg, Oral, DAILY, Allen Basista, Tami Ribas, MD, 300 mg at 01/13/19 3762  ???  gabapentin (NEURONTIN) capsule 600 mg, 600 mg, Oral, BID, Stanton Kidney, MD, 600 mg at 01/13/19 8315  ???  Saccharomyces boulardii (FLORASTOR) capsule 250 mg, 250 mg, Oral, ACB&D, Stanton Kidney, MD, 250 mg at 01/13/19 0631  ???  lipase-protease-amylase (CREON 24,000) capsule 1 Cap, 1 Cap, Oral, TID WITH MEALS, Stanton Kidney, MD, 1 Cap at 01/13/19 1761  ???  metoprolol succinate (TOPROL-XL) XL tablet 25 mg, 25 mg, Oral, DAILY, Stanton Kidney, MD, 25 mg at 01/13/19 6073  ???  polyvinyl alcohol-povidon(PF) (REFRESH CLASSIC) 1.4-0.6 % ophthalmic  solution 2 Drop, 2 Drop, Both Eyes, DAILY, Anikin Prosser, Tami Ribas, MD, 2 Drop at 01/13/19 0927  ???  tamsulosin (FLOMAX) capsule 0.8 mg, 0.8 mg, Oral, DAILY, Merdis Snodgrass, Tami Ribas, MD, 0.8 mg at 01/13/19 0932  ???  temazepam (RESTORIL) capsule 7.5 mg, 7.5 mg, Oral, QHS PRN, Stanton Kidney, MD, 7.5 mg at 01/12/19 2244  ???  naloxone (NARCAN) injection 0.1 mg, 0.1 mg, IntraVENous, PRN, Delene Loll, Tami Ribas, MD  ???  cefTRIAXone (ROCEPHIN) 1 g in 0.9% sodium chloride (MBP/ADV) 50 mL MBP, 1 g, IntraVENous, Q24H, Yamaira Spinner, Tami Ribas, MD, Last Rate: 100 mL/hr at 01/12/19 1648, 1 g at 01/12/19 1648  ???  ondansetron (ZOFRAN) injection 4 mg, 4 mg, IntraVENous, Q4H PRN, Delene Loll, Tami Ribas, MD  ???  enoxaparin (LOVENOX) injection 40 mg, 40 mg, SubCUTAneous, Q24H, Norris Brumbach, Tami Ribas, MD, 40 mg at 01/12/19 1648  ???  amLODIPine (NORVASC) tablet 10 mg, 10 mg, Oral, DAILY, Mekhia Brogan, Tami Ribas, MD, 10 mg at 01/13/19 0926  ???  azithromycin (ZITHROMAX) 500 mg in 0.9% sodium chloride 250 mL IVPB, 500 mg, IntraVENous, Q24H, Tangee Marszalek, Tami Ribas, MD, Last Rate: 250 mL/hr at 01/12/19  1959, 500 mg at 01/12/19 1959  ???  hydrALAZINE (APRESOLINE) 20 mg/mL injection 10 mg, 10 mg, IntraVENous, Q6H PRN, Haylie Mccutcheon, Tami Ribas, MD  ???  0.9% sodium chloride infusion 250 mL, 250 mL, IntraVENous, PRN, Delene Loll, Tami Ribas, MD  ???  dexamethasone (DECADRON) 4 mg/mL injection 6 mg, 6 mg, IntraVENous, Q24H, Bolivar Koranda, Tami Ribas, MD, 6 mg at 01/12/19 1647  ???  [COMPLETED] remdesivir 200 mg in 0.9% sodium chloride 250 mL IVPB, 200 mg, IntraVENous, ONCE, Stopped at 01/10/19 1855 **FOLLOWED BY** remdesivir 100 mg in 0.9% sodium chloride 250 mL IVPB, 100 mg, IntraVENous, QHS, Boots Mcglown, Tami Ribas, MD, 100 mg at 01/12/19 2113  ???  ketorolac (TORADOL) injection 15 mg, 15 mg, IntraVENous, Q8H PRN, Stanton Kidney, MD, 15 mg at 01/12/19 3557      Lab:     Lab Data Reviewed: (see below)  Recent Results (from the past 24 hour(s))   CBC WITH AUTOMATED DIFF    Collection Time: 01/13/19  6:39 AM   Result Value Ref Range    WBC 6.8 4.0 - 11.0 1000/mm3    RBC 4.19 3.80 - 5.70 M/uL    HGB 9.5 (L) 12.4 - 17.2 gm/dl    HCT 31.6 (L) 37.0 - 50.0 %    MCV 75.4 (L) 80.0 - 98.0 fL    MCH 22.7 (L) 23.0 - 34.6 pg    MCHC 30.1 30.0 - 36.0 gm/dl    PLATELET 169 140 - 450 1000/mm3    RDW-SD 59.5 (H) 35.1 - 43.9      NRBC 0 0 - 0      IMMATURE GRANULOCYTES 0.7 0.0 - 3.0 %    NEUTROPHILS 86.0 (H) 34 - 64 %    LYMPHOCYTES 7.7 (L) 28 - 48 %    MONOCYTES 5.5 1 - 13 %    EOSINOPHILS 0.0 0 - 5 %    BASOPHILS 0.1 0 - 3 %   METABOLIC PANEL, COMPREHENSIVE    Collection Time: 01/13/19  6:39 AM   Result Value Ref Range    Sodium 144 136 - 145 mEq/L    Potassium 4.4 3.5 - 5.1 mEq/L    Chloride 114 (H) 98 - 107 mEq/L    CO2 24 21 - 32 mEq/L    Glucose 152 (H) 74 - 106 mg/dl  BUN 25 7 - 25 mg/dl    Creatinine 1.4 (H) 0.6 - 1.3 mg/dl    GFR est AA >60      GFR est non-AA 53      Calcium 8.1 (L) 8.5 - 10.1 mg/dl    AST (SGOT) 122 (H) 15 - 37 U/L    ALT (SGPT) 71 12 - 78 U/L    Alk. phosphatase 101 45 - 117 U/L    Bilirubin, total 0.6 0.2 - 1.0 mg/dl    Protein, total 6.9 6.4 -  8.2 gm/dl    Albumin 2.6 (L) 3.4 - 5.0 gm/dl    Anion gap 7 5 - 15 mmol/L   MAGNESIUM    Collection Time: 01/13/19  6:39 AM   Result Value Ref Range    Magnesium 2.2 1.6 - 2.6 mg/dl   D DIMER    Collection Time: 01/13/19  9:48 AM   Result Value Ref Range    D DIMER 0.70 (H) 0.01 - 0.50 ug/mL (FEU)   FERRITIN    Collection Time: 01/13/19  9:48 AM   Result Value Ref Range    Ferritin 185.3 26.0 - 388.0 ng/ml   C REACTIVE PROTEIN, QT    Collection Time: 01/13/19  9:48 AM   Result Value Ref Range    C-Reactive protein 29.6 (H) 0.0 - 2.9 mg/L       No results found.    Active Problems:    Pneumonia due to COVID-19 virus (01/10/2019)      ____________________________________________________________________      Attending Physician: Stanton Kidney, MD       Dragon medical dictation software was used for portions of this report. Unintended voice recognition errors may occur.

## 2019-01-13 NOTE — Progress Notes (Signed)
Progress Notes by Donavan Foil, MD at 01/13/19 0920                Author: Donavan Foil, MD  Service: Pulmonary Disease  Author Type: Physician       Filed: 01/13/19 1102  Date of Service: 01/13/19 0920  Status: Addendum          Editor: Donavan Foil, MD (Physician)          Related Notes: Original Note by Donavan Foil, MD (Physician) filed at 01/13/19 1100                       [place]            Name:  Tommy Brown  MRN:  [id number]          DOB:  Jul 30, 1945  Hospital:  [facility]     Date:  01/13/2019               IMPRESSION:     ??    Acute hypoxemic respiratory failure due to COVID-19 pneumonitis   ??  COVID-19 pneumonitis   ??  Streptococcal pneumoniae pneumonia, positive strep pneumo urine antigen   ??  Urine culture growing Klebsiella  And E-coli sensitive to cephalosporin   ??  Hypertension   ??  BPH   ??  History of pancreatic cancer   ??  History of left hydronephrosis and stent placement               PLAN:     ??    Continue supplemental oxygen through Salters nasal cannula (15 lit/min).  Titrate, and target oxygen saturation greater than  88%.   ??  Continue proning and deep breathing exercise.   ??  Continue remdesivir for 5 days.  Continue dexamethasone 6 mg IV daily for total 10 days.  S/p convalescent plasma.   ??  Continue azithromycin and ceftriaxone .  Obtain ID consultation in light of positive urine culture and past history of left hydronephrosis and stent placement    ??  Monitor inflammatory markers.  Monitor D-dimer level.   ??  Continue subcu Lovenox for DVT prophylaxis          Subjective/Interval History:              Review of Systems    Constitutional: Negative.     HENT: Negative.     Eyes: Negative.     Respiratory: Positive for shortness of breath.     Cardiovascular: Negative.     Gastrointestinal: Negative.     Genitourinary: Negative.     Musculoskeletal: Negative.     Skin: Negative.      Neurological: Negative.     Endo/Heme/Allergies: Negative.     Psychiatric/Behavioral: Negative.             Objective:     Vital Signs:       Visit Vitals   BP  (!) 144/70 (BP 1 Location: Right arm, BP Patient Position: Supine)      Pulse  73      Temp  98.4 ??F (36.9 ??C)      Resp  18      Wt  61.2 kg (135 lb)      SpO2  90%      BMI  20.53 kg/m??                O2 Device: Hi  flow nasal cannula(salter)     O2 Flow Rate (L/min): 15 l/min     Temp (24hrs), Avg:98 ??F (36.7 ??C), Min:97.3 ??F (36.3 ??C), Max:98.6 ??F (37 ??C)           Intake/Output:    Last shift:      11/18 0701 - 11/18 1900   In: 120 [P.O.:120]   Out: 350 [Urine:350]   Last 3 shifts: 11/16 1901 - 11/18 0700   In: 2546.3 [P.O.:220; I.V.:2326.3]   Out: 1620 [Urine:1620]      Intake/Output Summary (Last 24 hours) at 01/13/2019 0920   Last data filed at 01/13/2019 0834     Gross per 24 hour        Intake  1400 ml        Output  1070 ml        Net  330 ml            Physical Exam   Vitals signs and nursing note reviewed.   HENT :       Mouth/Throat:      Mouth: Mucous membranes are moist.   Eyes :       Pupils: Pupils are equal, round, and reactive to light.   Cardiovascular:       Rate and Rhythm: Regular rhythm.   Pulmonary :       Breath sounds: No wheezing.   Abdominal:      General: Abdomen is flat.      Palpations: Abdomen is soft.      Tenderness: There is no  abdominal tenderness.     Musculoskeletal:          General: No swelling.    Skin:      General: Skin is warm and dry.   Neurological :       General: No focal deficit present.      Mental Status: He is alert.                 DATA:   Labs:     Recent Labs             01/13/19   0639  01/12/19   0321  01/10/19   2351     WBC  6.8  4.8  2.7*     HGB  9.5*  9.6*  10.1*     HCT  31.6*  32.1*  33.1*          PLT  169  138*  130*          Recent Labs               01/13/19   0639  01/12/19   0527  01/12/19   0417  01/12/19   0321  01/10/19   2351     NA  144   --    --   142  140     K  4.4   --    --    4.4  3.9     CL  114*   --    --   111*  107     CO2  24  23.0*  23.0*  25  23     GLU  152*   --    --   184*  147*     BUN  25   --    --   24  12     CREA  1.4*   --    --  1.2  1.2     CA  8.1*   --    --   8.6  8.3*     MG  2.2   --    --   2.1  1.9     ALB  2.6*   --    --   2.5*  2.5*            ALT  71   --    --   35  24          Recent Labs            01/12/19   0527  01/12/19   0417     PH  7.468*  7.440     PCO2  30.3*  32.5*     PO2  73.0*  53.0*     HCO3  22.0  22.1         FIO2  80   --                  Results               Procedure  Component  Value  Units  Date/Time           CULTURE, URINE [id number]  (Abnormal)  (Susceptibility)  Collected:  01/10/19 1305            Order Status:  Completed  Specimen:  Urine from Clean catch  Updated:  01/12/19 1841                Isolate  --                    >100,000 CFU/mL   Klebsiella pneumoniae ssp pneumoniae                     Isolate  --                    50,000 CFU/mL   Escherichia coli               Susceptibility           Klebsiella pneumoniae ssp pneumoniae          MIC          Ampicillin ($)  Resistant          Ampicillin/Sulbactam  Susceptible          Cefazolin ($)  Susceptible          Cefepime  Susceptible          Ceftazidime ($)  Susceptible          Ceftriaxone ($)  Susceptible          Ciprofloxacin ($)  Susceptible          Gentamicin ($)  Susceptible          Imipenem  Susceptible          Levofloxacin ($)  Susceptible          Nitrofurantoin  Intermediate          Piperacillin/Tazobactam  Susceptible          Tobramycin ($)  Susceptible          Trimethoprim/Sulfa  Susceptible                                Susceptibility  Escherichia coli          MIC          Ampicillin ($)  Intermediate          Ampicillin/Sulbactam  Susceptible          Cefazolin ($)  Susceptible          Cefepime  Susceptible          Ceftazidime ($)  Susceptible          Ceftriaxone ($)  Susceptible          Ciprofloxacin ($)  Resistant           Gentamicin ($)  Susceptible          Imipenem  Susceptible          Levofloxacin ($)  Resistant          Nitrofurantoin  Susceptible          Piperacillin/Tazobactam  Susceptible          Tobramycin ($)  Susceptible          Trimethoprim/Sulfa  Susceptible                                Susceptibility Comments         Klebsiella pneumoniae ssp pneumoniae        See Results        Escherichia coli        See Results                                LEGIONELLA PNEUMOPHILA AG, URINE [id number]  Collected:  01/10/19 1305            Order Status:  Completed  Specimen:  Urine  Updated:  01/10/19 1439                Legionella Ag, urine  NEGATIVE                  Comment:  Presumptive negative for L. pneumophila serogroup 1 antigen in urine, suggesting no recent or   current infection. Infection due to Legionella cannot be ruled out since other serogroups and   species may cause disease, antigen may not be present in urine in early infection and the level   of antigen present in the urine may be below the detection limit of the test.                        S.PNEUMO AG, UR/CSF [id number]  (Abnormal)  Collected:  01/10/19 1305            Order Status:  Completed  Specimen:  Urine  Updated:  01/10/19 1438                Strep pneumo Ag, urine  POSITIVE                  CULTURE, BLOOD [id number]  Collected:  01/10/19 1147            Order Status:  Completed  Specimen:  Blood  Updated:  01/13/19 0704                Blood Culture Result  No Growth At 48 Hours                CULTURE, BLOOD [  [id number]  Collected:  01/10/19 1130            Order Status:  Completed  Specimen:  Blood  Updated:  01/13/19 0704                Blood Culture Result  No Growth At 48 Hours                SARS-COV-2_FLU [id number]  (Abnormal)  Collected:  01/10/19 1050            Order Status:  Completed  Specimen:  NASOPHARYNGEAL SWAB  Updated:  01/10/19 1230                COVID-19  POSITIVE                    Comment:  Negative results do not preclude  SARS-CoV-2 infection and should not be used as the sole basis   for treatment or other patient management decisions. Negative results must be combined with   clinical observations, patient history, and epidemiological information.   Testing with the Xpert Xpress SARS-CoV-2 test is intended for use by trained operators who are   proficient in performing tests using either GeneXpert Dx, GeneXpert Infinity and/or [employer]. The Xpert Xpress SARS-CoV-2 test is only for use under the Food and Drug   Administration's Emergency Use Authorization.   THE  pos sars cov2_  RESULT WAS CALLED TO (AND READ BACK BY) MEGAN, ER _.              THE NOTIFICATION WAS MADE ON _11/15/20   BY _BD                        CULTURE, RESPIRATORY/SPUTUM/BRONCH [name] [id number]              Order Status:  No result  Specimen:  Sputum               XR Results (most recent):     Results from Hospital Encounter encounter on 01/10/19     XR CHEST SNGL V           Narrative  INDICATION: hypoxia, multifocal pneumonia, covid-19 positive. Dyspnea   COMPARISON: 01/10/2019   TECHNIQUE: Single view chest.              Impression  IMPRESSION:   The patient is status post sternotomy. Heart is mildly enlarged. Mild vascular   congestion. There are bilateral infiltrates in the mid to lower lung zones,   increased since prior radiograph.                    Imaging:   []  I have personally reviewed the patients radiographs   []  Radiographs reviewed with radiologist    []  No change from prior, tubes and lines in adequate position   [] Improved   [] Worsening            Donavan Foil, MD

## 2019-01-13 NOTE — Progress Notes (Signed)
Bedside and Verbal shift change report given to Maurine Minister Banker) (Cabin crew) by Bud Face (RN) (offgoing nurse). Report included the following information SBAR, Kardex, Intake/Output and MAR.

## 2019-01-13 NOTE — Progress Notes (Signed)
Problem: Falls - Risk of  Goal: *Absence of Falls  Description: Document Patrcia Dolly Fall Risk and appropriate interventions in the flowsheet.  Outcome: Progressing Towards Goal  Note: Fall Risk Interventions:  Mobility Interventions: Assess mobility with egress test, Bed/chair exit alarm         Medication Interventions: Bed/chair exit alarm    Elimination Interventions: Bed/chair exit alarm, Call light in reach              Problem: Risk for Spread of Infection  Goal: Prevent transmission of infectious organism to others  Description: Prevent the transmission of infectious organisms to other patients, staff members, and visitors.  Outcome: Progressing Towards Goal     Problem: Gas Exchange - Impaired  Goal: *Absence of hypoxia  Outcome: Progressing Towards Goal

## 2019-01-14 ENCOUNTER — Inpatient Hospital Stay: Admit: 2019-01-15 | Payer: MEDICARE | Primary: Family Medicine

## 2019-01-14 LAB — METABOLIC PANEL, COMPREHENSIVE
ALT (SGPT): 108 U/L — ABNORMAL HIGH (ref 12–78)
AST (SGOT): 139 U/L — ABNORMAL HIGH (ref 15–37)
Albumin: 2.6 gm/dl — ABNORMAL LOW (ref 3.4–5.0)
Alk. phosphatase: 104 U/L (ref 45–117)
Anion gap: 4 mmol/L — ABNORMAL LOW (ref 5–15)
BUN: 26 mg/dl — ABNORMAL HIGH (ref 7–25)
Bilirubin, total: 0.5 mg/dl (ref 0.2–1.0)
CO2: 27 mEq/L (ref 21–32)
Calcium: 8.6 mg/dl (ref 8.5–10.1)
Chloride: 112 mEq/L — ABNORMAL HIGH (ref 98–107)
Creatinine: 1.2 mg/dl (ref 0.6–1.3)
GFR est AA: >60
GFR est non-AA: >60
Glucose: 144 mg/dl — ABNORMAL HIGH (ref 74–106)
Potassium: 4.3 mEq/L (ref 3.5–5.1)
Protein, total: 7 gm/dl (ref 6.4–8.2)
Sodium: 143 mEq/L (ref 136–145)

## 2019-01-14 LAB — CBC WITH AUTOMATED DIFF
BASOPHILS: 0 % (ref 0–3)
EOSINOPHILS: 0 % (ref 0–5)
HCT: 31.7 % — ABNORMAL LOW (ref 37.0–50.0)
HGB: 9.7 gm/dl — ABNORMAL LOW (ref 12.4–17.2)
IMMATURE GRANULOCYTES: 1.4 % (ref 0.0–3.0)
LYMPHOCYTES: 9.8 % — ABNORMAL LOW (ref 28–48)
MCH: 22.9 pg — ABNORMAL LOW (ref 23.0–34.6)
MCHC: 30.6 gm/dl (ref 30.0–36.0)
MCV: 74.8 fL — ABNORMAL LOW (ref 80.0–98.0)
MONOCYTES: 5.1 % (ref 1–13)
NEUTROPHILS: 83.7 % — ABNORMAL HIGH (ref 34–64)
PLATELET: 172 10*3/uL (ref 140–450)
RBC: 4.24 M/uL (ref 3.80–5.70)
RDW-SD: 58.2 — ABNORMAL HIGH (ref 35.1–43.9)
WBC: 7.1 10*3/uL (ref 4.0–11.0)

## 2019-01-14 LAB — D DIMER: D DIMER: 0.67 ug/mL (FEU) — ABNORMAL HIGH (ref 0.01–0.50)

## 2019-01-14 LAB — C REACTIVE PROTEIN, QT: C-Reactive protein: 21.9 mg/L — ABNORMAL HIGH (ref 0.0–2.9)

## 2019-01-14 LAB — MAGNESIUM
Magnesium: 2.2 mg/dl (ref 1.6–2.6)
Magnesium: 2.2 mg/dl (ref 1.6–2.6)

## 2019-01-14 LAB — FERRITIN
Ferritin: 153.6 ng/mL (ref 26.0–388.0)
Ferritin: 153.6 ng/ml (ref 26.0–388.0)

## 2019-01-14 LAB — COMPREHENSIVE METABOLIC PANEL
ALT: 108 U/L — ABNORMAL HIGH (ref 12–78)
AST: 139 U/L — ABNORMAL HIGH (ref 15–37)
Albumin: 2.6 gm/dl — ABNORMAL LOW (ref 3.4–5.0)
Alkaline Phosphatase: 104 U/L (ref 45–117)
Anion Gap: 4 mmol/L — ABNORMAL LOW (ref 5–15)
BUN: 26 mg/dl — ABNORMAL HIGH (ref 7–25)
CO2: 27 mEq/L (ref 21–32)
Calcium: 8.6 mg/dl (ref 8.5–10.1)
Chloride: 112 mEq/L — ABNORMAL HIGH (ref 98–107)
Creatinine: 1.2 mg/dl (ref 0.6–1.3)
EGFR IF NonAfrican American: >60
GFR African American: >60
Glucose: 144 mg/dl — ABNORMAL HIGH (ref 74–106)
Potassium: 4.3 mEq/L (ref 3.5–5.1)
Sodium: 143 mEq/L (ref 136–145)
Total Bilirubin: 0.5 mg/dl (ref 0.2–1.0)
Total Protein: 7 gm/dl (ref 6.4–8.2)

## 2019-01-14 LAB — CBC WITH AUTO DIFFERENTIAL
Basophils %: 0 % (ref 0–3)
Eosinophils %: 0 % (ref 0–5)
Hematocrit: 31.7 % — ABNORMAL LOW (ref 37.0–50.0)
Hemoglobin: 9.7 gm/dl — ABNORMAL LOW (ref 12.4–17.2)
Immature Granulocytes: 1.4 % (ref 0.0–3.0)
Lymphocytes %: 9.8 % — ABNORMAL LOW (ref 28–48)
MCH: 22.9 pg — ABNORMAL LOW (ref 23.0–34.6)
MCHC: 30.6 gm/dl (ref 30.0–36.0)
MCV: 74.8 fL — ABNORMAL LOW (ref 80.0–98.0)
Monocytes %: 5.1 % (ref 1–13)
Neutrophils %: 83.7 % — ABNORMAL HIGH (ref 34–64)
Platelets: 172 10*3/uL (ref 140–450)
RBC: 4.24 M/uL (ref 3.80–5.70)
RDW-SD: 58.2 — ABNORMAL HIGH (ref 35.1–43.9)
WBC: 7.1 10*3/uL (ref 4.0–11.0)

## 2019-01-14 LAB — C-REACTIVE PROTEIN: CRP: 21.9 mg/L — ABNORMAL HIGH (ref 0.0–2.9)

## 2019-01-14 LAB — D-DIMER, QUANTITATIVE: D-Dimer, Quant: 0.67 ug/mL (FEU) — ABNORMAL HIGH (ref 0.01–0.50)

## 2019-01-14 MED ORDER — DEXAMETHASONE 2 MG TAB
2 mg | Freq: Every day | ORAL | Status: AC
Start: 2019-01-14 — End: 2019-01-19
  Administered 2019-01-15 – 2019-01-19 (×5): via ORAL

## 2019-01-14 MED ORDER — KETOTIFEN 0.025 % (0.035 %) EYE DROPS
0.025 % (0.035 %) | Freq: Two times a day (BID) | OPHTHALMIC | Status: DC
Start: 2019-01-14 — End: 2019-01-20
  Administered 2019-01-14 – 2019-01-20 (×14): via OPHTHALMIC

## 2019-01-14 MED FILL — CREON 24,000-76,000-120,000 UNIT CAPSULE,DELAYED RELEASE: 24000-76000 -120,000 unit | ORAL | Qty: 1

## 2019-01-14 MED FILL — ALLOPURINOL 300 MG TAB: 300 mg | ORAL | Qty: 1

## 2019-01-14 MED FILL — POLYVINYL ALCOHOL-POVIDONE 1.4 %-0.6 % EYE DROPPERETTE: OPHTHALMIC | Qty: 1

## 2019-01-14 MED FILL — DEXAMETHASONE SODIUM PHOSPHATE 4 MG/ML IJ SOLN: 4 mg/mL | INTRAMUSCULAR | Qty: 2

## 2019-01-14 MED FILL — AZITHROMYCIN 500 MG IV SOLUTION: 500 mg | INTRAVENOUS | Qty: 5

## 2019-01-14 MED FILL — KETOTIFEN 0.025 % (0.035 %) EYE DROPS: 0.025 % (0.035 %) | OPHTHALMIC | Qty: 5

## 2019-01-14 MED FILL — FLORASTOR 250 MG CAPSULE: 250 mg | ORAL | Qty: 1

## 2019-01-14 MED FILL — METOPROLOL SUCCINATE SR 25 MG 24 HR TAB: 25 mg | ORAL | Qty: 1

## 2019-01-14 MED FILL — AMLODIPINE 5 MG TAB: 5 mg | ORAL | Qty: 2

## 2019-01-14 MED FILL — ENOXAPARIN 40 MG/0.4 ML SUB-Q SYRINGE: 40 mg/0.4 mL | SUBCUTANEOUS | Qty: 0.4

## 2019-01-14 MED FILL — TAMSULOSIN SR 0.4 MG 24 HR CAP: 0.4 mg | ORAL | Qty: 2

## 2019-01-14 MED FILL — GABAPENTIN 300 MG CAP: 300 mg | ORAL | Qty: 2

## 2019-01-14 MED FILL — TEMAZEPAM 7.5 MG CAP: 7.5 mg | ORAL | Qty: 1

## 2019-01-14 MED FILL — CEFTRIAXONE 1 GRAM SOLUTION FOR INJECTION: 1 gram | INTRAMUSCULAR | Qty: 1

## 2019-01-14 NOTE — Progress Notes (Signed)
Problem: Falls - Risk of  Goal: *Absence of Falls  Description: Document Schmid Fall Risk and appropriate interventions in the flowsheet.  Outcome: Progressing Towards Goal  Note: Fall Risk Interventions:  Mobility Interventions: Bed/chair exit alarm, Patient to call before getting OOB         Medication Interventions: Bed/chair exit alarm, Patient to call before getting OOB, Teach patient to arise slowly    Elimination Interventions: Bed/chair exit alarm, Call light in reach, Urinal in reach

## 2019-01-14 NOTE — Progress Notes (Signed)
[facility] Dosing Services: IV to PO Conversion    The pharmacist has determined that this patient meets P & T approved criteria for conversion from IV to oral therapy for dexamethasone.    The pharmacist has written the following order for the patient: change dexamethasone IV to PO to complete a total of 10 days.    The pharmacist will continue to monitor the patient's status and advise the physician if conversion back to IV therapy is recommended.    Signed Joesph July, PHARMD

## 2019-01-14 NOTE — Progress Notes (Signed)
Medical Progress Note      NAME: Tommy Brown   DOB:  Oct 04, 1945  MRN:             [id number]    Date:  01/14/2019        Assessment:     1. Acute hypoxic respiratory failure  2. COVID-19 infection  3. Multifocal pneumonia  4. Pancytopenia  5. UTI with Klebsiella pneumoniae  6. Hypertension  7. CAD status post CABG  8. Gout  9. Pancreatic cancer  10. Neuropathy    Plan:     ?? Continue supplemental oxygen and titrate as needed   ?? Encourage prone positioning  ?? Continue dexamethasone daily complete 10 days course of treatment  ?? Patient already received convalescent plasma and completed remdesivir  ?? Continue antibiotics with ceftriaxone and Zithromax   ?? Pulmonary and ID also following   ?? Discussed with the patient   ?? Discussed with and updated his wife    Subjective:     Shortness of breathing, denies fever    Objective:     Vitals:    Last 24hrs VS reviewed since prior progress note. Most recent are:  Visit Vitals  BP (!) 113/49 (BP Patient Position: Supine)   Pulse 72   Temp 97.3 ??F (36.3 ??C)   Resp 20   Wt 61.2 kg (135 lb)   SpO2 94%   BMI 20.53 kg/m??     SpO2 Readings from Last 6 Encounters:   01/14/19 94%   07/31/18 98%   07/17/18 99%   06/26/18 97%   01/21/18 96%    O2 Flow Rate (L/min): 15 l/min       Intake/Output Summary (Last 24 hours) at 01/14/2019 1437  Last data filed at 01/14/2019 1317  Gross per 24 hour   Intake 910 ml   Output 850 ml   Net 60 ml        Exam:     General:   Not in acute distress  HEENT: PERRLA, Neck Supple,  No JVD  Respiratory:   CTA bilaterally-no wheezes, rales, rhonchi, or crackles  Cardiac:  Regular Rate and Rythmn  - no murmurs, rubs or gallops  Abdominal:  Soft, non-tender, non-distended, positive bowel sounds  Extremities:  No cyanosis, or edema.  Skin: No rash  Neurological:  No focal neurological deficits    Medication:   Current Medications Reviewed    Current Facility-Administered Medications:    ???  ketotifen (ZADITOR) 0.025 % (0.035 %) ophthalmic solution 1 Drop, 1 Drop, Both Eyes, BID, Stanton Kidney, MD, 1 Drop at 01/14/19 0801  ???  promethazine (PHENERGAN) 25 mg in NS IVPB, 25 mg, IntraVENous, Q6H PRN, Stanton Kidney, MD, Last Rate: 200 mL/hr at 01/11/19 1955, 25 mg at 01/11/19 1955  ???  allopurinoL (ZYLOPRIM) tablet 300 mg, 300 mg, Oral, DAILY, Carisha Kantor, Tami Ribas, MD, 300 mg at 01/14/19 0800  ???  gabapentin (NEURONTIN) capsule 600 mg, 600 mg, Oral, BID, Divina Neale, Tami Ribas, MD, 600 mg at 01/14/19 0800  ???  Saccharomyces boulardii (FLORASTOR) capsule 250 mg, 250 mg, Oral, ACB&D, Stanton Kidney, MD, 250 mg at 01/14/19 0640  ???  lipase-protease-amylase (CREON 24,000) capsule 1 Cap, 1 Cap, Oral, TID WITH MEALS, Exilda Wilhite, Tami Ribas, MD, 1 Cap at 01/14/19 1159  ???  metoprolol succinate (TOPROL-XL) XL tablet 25 mg, 25 mg, Oral, DAILY, Rainey Kahrs, Tami Ribas, MD, 25 mg at 01/14/19 0800  ???  polyvinyl alcohol-povidon(PF) (REFRESH CLASSIC) 1.4-0.6 % ophthalmic solution  2 Drop, 2 Drop, Both Eyes, DAILY, Clotine Heiner, Tami Ribas, MD, 2 Drop at 01/14/19 0800  ???  tamsulosin (FLOMAX) capsule 0.8 mg, 0.8 mg, Oral, DAILY, Kenton Fortin, Tami Ribas, MD, 0.8 mg at 01/14/19 0800  ???  temazepam (RESTORIL) capsule 7.5 mg, 7.5 mg, Oral, QHS PRN, Stanton Kidney, MD, 7.5 mg at 01/13/19 2008  ???  naloxone (NARCAN) injection 0.1 mg, 0.1 mg, IntraVENous, PRN, Delene Loll, Tami Ribas, MD  ???  cefTRIAXone (ROCEPHIN) 1 g in 0.9% sodium chloride (MBP/ADV) 50 mL MBP, 1 g, IntraVENous, Q24H, Cree Kunert, Tami Ribas, MD, Stopped at 01/13/19 1729  ???  ondansetron (ZOFRAN) injection 4 mg, 4 mg, IntraVENous, Q4H PRN, Delene Loll, Tami Ribas, MD  ???  enoxaparin (LOVENOX) injection 40 mg, 40 mg, SubCUTAneous, Q24H, Cortlan Dolin, Tami Ribas, MD, 40 mg at 01/13/19 1647  ???  amLODIPine (NORVASC) tablet 10 mg, 10 mg, Oral, DAILY, Cletus Paris, Tami Ribas, MD, 10 mg at 01/14/19 0800   ???  azithromycin (ZITHROMAX) 500 mg in 0.9% sodium chloride 250 mL IVPB, 500 mg, IntraVENous, Q24H, Mamie Hundertmark, Tami Ribas, MD, Last Rate: 250 mL/hr at 01/13/19 2008, 500 mg at 01/13/19 2008  ???  hydrALAZINE (APRESOLINE) 20 mg/mL injection 10 mg, 10 mg, IntraVENous, Q6H PRN, Loomis Anacker, Tami Ribas, MD  ???  0.9% sodium chloride infusion 250 mL, 250 mL, IntraVENous, PRN, Jeiden Daughtridge, Tami Ribas, MD  ???  dexamethasone (DECADRON) 4 mg/mL injection 6 mg, 6 mg, IntraVENous, Q24H, Disaya Walt, Tami Ribas, MD, 6 mg at 01/13/19 1709  ???  [COMPLETED] remdesivir 200 mg in 0.9% sodium chloride 250 mL IVPB, 200 mg, IntraVENous, ONCE, Stopped at 01/10/19 1855 **FOLLOWED BY** remdesivir 100 mg in 0.9% sodium chloride 250 mL IVPB, 100 mg, IntraVENous, QHS, Dariella Gillihan, Tami Ribas, MD, 100 mg at 01/13/19 2256  ???  ketorolac (TORADOL) injection 15 mg, 15 mg, IntraVENous, Q8H PRN, Stanton Kidney, MD, 15 mg at 01/12/19 3875      Lab:     Lab Data Reviewed: (see below)  Recent Results (from the past 24 hour(s))   CBC WITH AUTOMATED DIFF    Collection Time: 01/14/19  5:55 AM   Result Value Ref Range    WBC 7.1 4.0 - 11.0 1000/mm3    RBC 4.24 3.80 - 5.70 M/uL    HGB 9.7 (L) 12.4 - 17.2 gm/dl    HCT 31.7 (L) 37.0 - 50.0 %    MCV 74.8 (L) 80.0 - 98.0 fL    MCH 22.9 (L) 23.0 - 34.6 pg    MCHC 30.6 30.0 - 36.0 gm/dl    PLATELET 172 140 - 450 1000/mm3    RDW-SD 58.2 (H) 35.1 - 43.9      IMMATURE GRANULOCYTES 1.4 0.0 - 3.0 %    NEUTROPHILS 83.7 (H) 34 - 64 %    LYMPHOCYTES 9.8 (L) 28 - 48 %    MONOCYTES 5.1 1 - 13 %    EOSINOPHILS 0.0 0 - 5 %    BASOPHILS 0.0 0 - 3 %   METABOLIC PANEL, COMPREHENSIVE    Collection Time: 01/14/19  5:55 AM   Result Value Ref Range    Sodium 143 136 - 145 mEq/L    Potassium 4.3 3.5 - 5.1 mEq/L    Chloride 112 (H) 98 - 107 mEq/L    CO2 27 21 - 32 mEq/L    Glucose 144 (H) 74 - 106 mg/dl    BUN 26 (H) 7 - 25 mg/dl    Creatinine 1.2 0.6 - 1.3 mg/dl  GFR est AA >60      GFR est non-AA >60      Calcium 8.6 8.5 - 10.1 mg/dl    AST (SGOT) 139 (H) 15 - 37 U/L     ALT (SGPT) 108 (H) 12 - 78 U/L    Alk. phosphatase 104 45 - 117 U/L    Bilirubin, total 0.5 0.2 - 1.0 mg/dl    Protein, total 7.0 6.4 - 8.2 gm/dl    Albumin 2.6 (L) 3.4 - 5.0 gm/dl    Anion gap 4 (L) 5 - 15 mmol/L   D DIMER    Collection Time: 01/14/19  5:55 AM   Result Value Ref Range    D DIMER 0.67 (H) 0.01 - 0.50 ug/mL (FEU)   FERRITIN    Collection Time: 01/14/19  5:55 AM   Result Value Ref Range    Ferritin 153.6 26.0 - 388.0 ng/ml   C REACTIVE PROTEIN, QT    Collection Time: 01/14/19  5:55 AM   Result Value Ref Range    C-Reactive protein 21.9 (H) 0.0 - 2.9 mg/L   MAGNESIUM    Collection Time: 01/14/19  5:55 AM   Result Value Ref Range    Magnesium 2.2 1.6 - 2.6 mg/dl       No results found.    Active Problems:    Pneumonia due to COVID-19 virus (01/10/2019)      ____________________________________________________________________      Attending Physician: Stanton Kidney, MD       Dragon medical dictation software was used for portions of this report. Unintended voice recognition errors may occur.

## 2019-01-14 NOTE — Progress Notes (Signed)
INFECTIOUS DISEASE FOLLOW UP NOTE     Date of admission: 01/10/2019  ??  Date of consult: January 13, 2019  ??  ??  ABX:   ??  Current abx Prior abx    Zithromax 11/15-4  Rocephin 11/15-4  ??  Decadron 11/15-4  Remdesivir 11/15-4  Convalescent plasma 11/15 ??   ??  ASSESSMENT:    ??  Pneumonia--Strep Pneumonia Urine ag +   Viral pneumonia--COVID-19 positive   UTI  UA unrevealing  Urine culture positive clubs and E. coli   History of left hydronephrosis due to renal calculi  06/22/18 JJ stent placement  07/31/2018 cystoscopy left retrograde pyelogram left ureteroscopy laser lithotripsy proximal ureteral stone and extraction of left renal calculi and exchange of double-J stents  08/06/2018 during stent removed   CAD s/p CABG   Pancreatic cancer  2006 for procedure, radiation, chemo   Gout   Hypogonadism   ??   ??  ??  ??  RECOMMENDATIONS:   [name] is a 73 year old male with past medical history of hypertension, CAD, prostate cancer, hydronephrosis requiring stent was admitted for shortness of breath, positive COVID-19 and multifocal pneumonia  ??  -Patient with multifocal pneumonia on chest x-ray  -Positive for COVID-19  -Patient also with positive strep pneumo antigen  -Check sputum culture  -Cont IV remdesivir, steroids  -Pt s/p convalescent plasma  -Continue on broad-spectrum antibiotics with ceftriaxone and azithromycin and plan short course  -Watch for any worsening  -Inflammatory markers mildly elevated D-dimer of 0.7  ??  -Patient's urine culture with Klebsiella and E. Coli  -Given presence of stent, will need renal ultrasound  -Monitor labs and cultures  -Plan discussed with [name]        ??  COVID-19 Lab test monitoring:   Blood type: A Rh Negative  Pregnancy:  Quantiferon TB gold:  ??  ??  Labs:       Recent Labs     01/12/19  0527 01/12/19  0417   PTEMPI 98.0 F 98.0 F    O2ST 96.0 89.0*   FIO2 80  --    ??         Recent Labs     01/13/19  0639 01/12/19  0321 01/10/19  2351 01/10/19  1711   WBC 6.8 4.8 2.7* 3.4*   CREA 1.4* 1.2 1.2 1.2   PLT 169 138* 130* 128*   AST 122* 72* 54* 64*   ALT 71 35 24 27   ??       Recent Labs     01/13/19  0948 01/12/19  0321   CRP 29.6* 81.2*   FERR 185.3 190.8   LDH  --  337*   DDIME 0.70* 0.52*   FIBRN  --  443*   TRIGL  --  105   ??  ??               MICROBIOLOGY:   11/15   BC x2 NGTD              UC Klebsiella pneumonia and E. Coli              Strep pneumonia urine antigen positive              Legionella urine antigen negative              19+  ??  LINES AND CATHETERS:   PIV  ??    SUBJECTIVE :     Interval  notes reviewed.  Pt afebrile. He is lying on his stomach. Feels sig better than before. Updates given. No n, v or diarrhea.    OBJECTIVE     Visit Vitals  BP (!) 113/49 (BP Patient Position: Supine)   Pulse 72   Temp 97.3 ??F (36.3 ??C)   Resp 20   Wt 61.2 kg (135 lb)   SpO2 94%   BMI 20.53 kg/m??       Temp (24hrs), Avg:98 ??F (36.7 ??C), Min:97.3 ??F (36.3 ??C), Max:98.3 ??F (36.8 ??C)    General: No apparent distress  HEENT: Normocephalic, mucous membranes pink and moist anicteric.    Neck: Supple, no lymphadenopathy, masses or thyromegaly  Chest: Symmetrical expansion  Lungs:  Crackles  Heart: S1-S2 were distinct and regular grade 2/6 systolic murmur  Abdomen: Soft, tender with active bowel sounds  Musculoskeletal: Normal strength/tone. No edema. No clubbing or cyanosis  CNS: Awake and alert, oriented x3.  Smile symmetric tongue midline  SKIN: No rash no jaundice      MEDICATIONS:     Current Facility-Administered Medications   Medication Dose Route Frequency   ??? ketotifen (ZADITOR) 0.025 % (0.035 %) ophthalmic solution 1 Drop  1 Drop Both Eyes BID   ??? promethazine (PHENERGAN) 25 mg in NS IVPB  25 mg IntraVENous Q6H PRN   ??? allopurinoL (ZYLOPRIM) tablet 300 mg  300 mg Oral DAILY   ??? gabapentin (NEURONTIN) capsule 600 mg  600 mg Oral BID    ??? Saccharomyces boulardii (FLORASTOR) capsule 250 mg  250 mg Oral ACB&D   ??? lipase-protease-amylase (CREON 24,000) capsule 1 Cap  1 Cap Oral TID WITH MEALS   ??? metoprolol succinate (TOPROL-XL) XL tablet 25 mg  25 mg Oral DAILY   ??? polyvinyl alcohol-povidon(PF) (REFRESH CLASSIC) 1.4-0.6 % ophthalmic solution 2 Drop  2 Drop Both Eyes DAILY   ??? tamsulosin (FLOMAX) capsule 0.8 mg  0.8 mg Oral DAILY   ??? temazepam (RESTORIL) capsule 7.5 mg  7.5 mg Oral QHS PRN   ??? naloxone (NARCAN) injection 0.1 mg  0.1 mg IntraVENous PRN   ??? cefTRIAXone (ROCEPHIN) 1 g in 0.9% sodium chloride (MBP/ADV) 50 mL MBP  1 g IntraVENous Q24H   ??? ondansetron (ZOFRAN) injection 4 mg  4 mg IntraVENous Q4H PRN   ??? enoxaparin (LOVENOX) injection 40 mg  40 mg SubCUTAneous Q24H   ??? amLODIPine (NORVASC) tablet 10 mg  10 mg Oral DAILY   ??? azithromycin (ZITHROMAX) 500 mg in 0.9% sodium chloride 250 mL IVPB  500 mg IntraVENous Q24H   ??? hydrALAZINE (APRESOLINE) 20 mg/mL injection 10 mg  10 mg IntraVENous Q6H PRN   ??? 0.9% sodium chloride infusion 250 mL  250 mL IntraVENous PRN   ??? dexamethasone (DECADRON) 4 mg/mL injection 6 mg  6 mg IntraVENous Q24H   ??? remdesivir 100 mg in 0.9% sodium chloride 250 mL IVPB  100 mg IntraVENous QHS   ??? ketorolac (TORADOL) injection 15 mg  15 mg IntraVENous Q8H PRN       Labs: Results:   Chemistry Recent Labs     01/14/19  0555 01/13/19  0639 01/12/19  0527  01/12/19  0321   GLU 144* 152*  --   --  184*   NA 143 144  --   --  142   K 4.3 4.4  --   --  4.4   CL 112* 114*  --   --  111*   CO2 27 24 23.0*   < >  25   BUN 26* 25  --   --  24   CREA 1.2 1.4*  --   --  1.2   CA 8.6 8.1*  --   --  8.6   AGAP 4* 7  --   --  6   AP 104 101  --   --  101   TP 7.0 6.9  --   --  6.9   ALB 2.6* 2.6*  --   --  2.5*    < > = values in this interval not displayed.      CBC w/Diff Recent Labs     01/14/19  0555 01/13/19  0639 01/12/19  0321   WBC 7.1 6.8 4.8   RBC 4.24 4.19 4.26   HGB 9.7* 9.5* 9.6*   HCT 31.7* 31.6* 32.1*   PLT 172 169 138*    GRANS 83.7* 86.0* 82.5*   LYMPH 9.8* 7.7* 11.9*   EOS 0.0 0.0 0.0          RADIOLOGY :        Imaging  Results from Hospital Encounter encounter on 01/10/19   XR CHEST SNGL V    Narrative INDICATION: hypoxia, multifocal pneumonia, covid-19 positive. Dyspnea  COMPARISON: 01/10/2019  TECHNIQUE: Single view chest.      Impression IMPRESSION:  The patient is status post sternotomy. Heart is mildly enlarged. Mild vascular  congestion. There are bilateral infiltrates in the mid to lower lung zones,  increased since prior radiograph.         Results from [facility] encounter on 06/22/18   CT ABD PELV WO CONT    Narrative CT Abdomen and Pelvis without    Indication: Left hydronephrosis. Evaluate for obstructing stone or lesion.    Comparison: Ultrasound 06/22/2018. CT 05/21/2018.    TECHNIQUE:   CT of the abdomen and pelvis WITHOUT intravenous contrast. Coronal and sagittal  reformations were obtained.        All CT exams at this facility use one or more dose reduction techniques  including automatic exposure control, mA/kV adjustment per patient's size, or  iterative reconstruction technique. DICOM format imaged data is available to  non-affiliated external healthcare facilities or entities on a secure, media  free, reciprocal searchable basis with patient authorization for 12 months  following the date of the study.    DISCUSSION:    ABSENCE OF INTRAVENOUS CONTRAST DECREASES SENSITIVITY FOR DETECTION OF FOCAL  LESIONS AND VASCULAR PATHOLOGY.    LOWER THORAX: Coronary artery calcifications.    HEPATOBILIARY: Multiple subcentimeter foci of hypoattenuation, too small to  characterize but unchanged. Cholecystectomy. Left intrahepatic pneumobilia,  unchanged.  SPLEEN: No splenomegaly.  PANCREAS: Postoperative changes from prior Whipple procedure. No mass or duct  dilatation in the residual pancreatic tail.    ADRENALS: No adrenal nodules.  KIDNEYS/URETERS: Multiple bilateral renal cysts. Multiple small (2 to 4 mm)   nonobstructing bilateral renal calculi. There is a 10 x 9 mm nonobstructing  calculus in the lower pole of the left kidney. Moderate left hydronephrosis and  hydroureter. There is a 8 x 7 mm obstructing calculus in the proximal left  ureter. No right hydronephrosis. No suspicious renal lesions.  PELVIC ORGANS/BLADDER: Enlarged prostate. Penile implant. The bladder is  unremarkable.    PERITONEUM / RETROPERITONEUM: No free air or fluid.  LYMPH NODES: No lymphadenopathy.  VESSELS: Extensive atherosclerotic vascular calcifications. Infrarenal abdominal  aorta ectasia.    GI TRACT: No distention or wall thickening. Diverticulosis. Normal appendix.  BONES AND SOFT TISSUES: Posterior lumbar fixation at L4-5. Mild retrolisthesis  at L2-3 and L3-4. Moderate to advanced disc height loss at L2-3. Mild  spondylosis of the remaining lumbar spine.      Impression IMPRESSION:    1.  Obstructing 8 mm calculus in the left proximal ureter causing moderate left  hydronephrosis.  2.  Multiple additional nonobstructing bilateral renal calculi.  3.  Multiple bilateral renal cysts.  4.  Diverticulosis.  5.  Postoperative changes from prior Whipple procedure.  6.  Multiple hypoattenuating liver lesions, unchanged.       I have independently examined the patient and reviewed lab studies and imgaing as well as review of nursing notes and physican notes from the past 24 hours.    Dragon medical dictation software was used for portions of this report. Unintended errors may occur.     [name], MD  January 14, 2019    [facility]  [phone]

## 2019-01-14 NOTE — Progress Notes (Signed)
Problem: Falls - Risk of  Goal: *Absence of Falls  Description: Document Tommy Brown Fall Risk and appropriate interventions in the flowsheet.  Outcome: Progressing Towards Goal  Note: Fall Risk Interventions:  Mobility Interventions: Bed/chair exit alarm, Patient to call before getting OOB         Medication Interventions: Bed/chair exit alarm, Patient to call before getting OOB, Teach patient to arise slowly    Elimination Interventions: Bed/chair exit alarm, Call light in reach, Urinal in reach              Problem: Patient Education: Go to Patient Education Activity  Goal: Patient/Family Education  Outcome: Progressing Towards Goal     Problem: Risk for Spread of Infection  Goal: Prevent transmission of infectious organism to others  Description: Prevent the transmission of infectious organisms to other patients, staff members, and visitors.  Outcome: Progressing Towards Goal     Problem: Patient Education:  Go to Education Activity  Goal: Patient/Family Education  Outcome: Progressing Towards Goal     Problem: Gas Exchange - Impaired  Goal: *Absence of hypoxia  Outcome: Progressing Towards Goal     Problem: Patient Education: Go to Patient Education Activity  Goal: Patient/Family Education  Outcome: Progressing Towards Goal     Problem: Pressure Injury - Risk of  Goal: *Prevention of pressure injury  Description: Document Braden Scale and appropriate interventions in the flowsheet.  Outcome: Progressing Towards Goal  Note: Pressure Injury Interventions:  Sensory Interventions: Assess changes in LOC, Keep linens dry and wrinkle-free    Moisture Interventions: Absorbent underpads, Apply protective barrier, creams and emollients    Activity Interventions: Increase time out of bed    Mobility Interventions: HOB 30 degrees or less    Nutrition Interventions: Document food/fluid/supplement intake                     Problem: Patient Education: Go to Patient Education Activity  Goal: Patient/Family Education   Outcome: Progressing Towards Goal

## 2019-01-14 NOTE — Progress Notes (Signed)
Bedside and Verbal shift change report given to Jonelle Sidle Investment banker, corporate) (Soil scientist) by Ginger Carne (RN) (offgoing nurse). Report included the following information SBAR, Kardex, Intake/Output and MAR.

## 2019-01-14 NOTE — Progress Notes (Signed)
Problem: Gas Exchange - Impaired  Goal: *Absence of hypoxia  Outcome: Progressing Towards Goal

## 2019-01-14 NOTE — Progress Notes (Signed)
BEDSIDE shift change report given to Vanetta Mulders, RN by Judeth Porch, RN. Report included the following information SBAR REPORTS and Kardex.

## 2019-01-14 NOTE — Progress Notes (Signed)
[place] [facility]     Name: Tommy Brown MRN: [id number]   DOB: 18-Mar-1945 Hospital: [facility]   Date: 01/14/2019        IMPRESSION:   ?? Acute hypoxemic respiratory failure due to COVID-19 pneumonitis  ?? COVID-19 pneumonitis  ?? Streptococcal pneumoniae pneumonia, positive strep pneumo urine antigen  ?? Urine culture growing Klebsiella  and E-coli sensitive to cephalosporin  ?? Hypertension  ?? BPH  ?? History of pancreatic cancer  ?? History of left hydronephrosis and stent placement       PLAN:   ?? Continue supplemental oxygen through [place] nasal cannula (15 lit/min).  Titrate, and target oxygen saturation greater than 88%.  ?? Continue proning and deep breathing exercise.  ?? Continue remdesivir for 5 days.  Continue dexamethasone 6 mg IV daily for total 10 days.  S/p convalescent plasma.  ?? Continue azithromycin and ceftriaxone. Follow ID recommendation   ?? Monitor inflammatory markers-slow improvement.  Monitor D-dimer level-relatively stable.  ?? Continue subcu Lovenox for DVT prophylaxis     Subjective/Interval History:     No significant change in patient's overall oxygen requirement.    Review of Systems   Constitutional: Negative.    HENT: Negative.    Eyes: Negative.    Respiratory: Positive for shortness of breath.    Cardiovascular: Negative.    Gastrointestinal: Negative.    Genitourinary: Negative.    Musculoskeletal: Negative.    Skin: Negative.    Neurological: Negative.    Endo/Heme/Allergies: Negative.    Psychiatric/Behavioral: Negative.        Objective:   Vital Signs:    Visit Vitals  BP 124/65 (BP 1 Location: Left arm, BP Patient Position: Prone)   Pulse 73   Temp 97.8 ??F (36.6 ??C)   Resp 20   Wt 61.2 kg (135 lb)   SpO2 90% Comment: pt laying on right side   BMI 20.53 kg/m??       O2 Device: Hi flow nasal cannula(salter)   O2 Flow Rate (L/min): 15 l/min   Temp (24hrs), Avg:98 ??F (36.7 ??C), Min:97.6 ??F (36.4 ??C), Max:98.3 ??F (36.8 ??C)        Intake/Output:   Last shift:      No intake/output data recorded.  Last 3 shifts: 11/17 1901 - 11/19 0700  In: 1390 [P.O.:340; I.V.:1050]  Out: 1150 [Urine:1150]    Intake/Output Summary (Last 24 hours) at 01/14/2019 0850  Last data filed at 01/13/2019 2256  Gross per 24 hour   Intake 770 ml   Output 500 ml   Net 270 ml        Physical Exam  Vitals signs and nursing note reviewed.   HENT:      Mouth/Throat:      Mouth: Mucous membranes are moist.   Eyes:      Pupils: Pupils are equal, round, and reactive to light.   Cardiovascular:      Rate and Rhythm: Regular rhythm.   Pulmonary:      Breath sounds: No wheezing.   Abdominal:      General: Abdomen is flat.      Palpations: Abdomen is soft.      Tenderness: There is no abdominal tenderness.   Musculoskeletal:         General: No swelling.   Skin:     General: Skin is warm and dry.   Neurological:      General: No focal deficit present.  Mental Status: He is alert.             DATA:  Labs:  Recent Labs     01/14/19  0555 01/13/19  0639 01/12/19  0321   WBC 7.1 6.8 4.8   HGB 9.7* 9.5* 9.6*   HCT 31.7* 31.6* 32.1*   PLT 172 169 138*     Recent Labs     01/14/19  0555 01/13/19  0639 01/12/19  0527  01/12/19  0321   NA 143 144  --   --  142   K 4.3 4.4  --   --  4.4   CL 112* 114*  --   --  111*   CO2 27 24 23.0*   < > 25   GLU 144* 152*  --   --  184*   BUN 26* 25  --   --  24   CREA 1.2 1.4*  --   --  1.2   CA 8.6 8.1*  --   --  8.6   MG 2.2 2.2  --   --  2.1   ALB 2.6* 2.6*  --   --  2.5*   ALT 108* 71  --   --  35    < > = values in this interval not displayed.     Recent Labs     01/12/19  0527 01/12/19  0417   PH 7.468* 7.440   PCO2 30.3* 32.5*   PO2 73.0* 53.0*   HCO3 22.0 22.1   FIO2 80  --           Results     Procedure Component Value Units Date/Time    CULTURE, RESPIRATORY/SPUTUM/BRONCH Sid Falcon STAIN [id number]     Order Status:  No result Specimen:  Sputum     CULTURE, URINE [id number]  (Abnormal)  (Susceptibility) Collected:  01/10/19 1305     Order Status:  Completed Specimen:  Urine from Clean catch Updated:  01/12/19 1841     Isolate --        >100,000 CFU/mL  Klebsiella pneumoniae ssp pneumoniae       Isolate --        50,000 CFU/mL  Escherichia coli      Susceptibility      Klebsiella pneumoniae ssp pneumoniae     MIC     Ampicillin ($) Resistant     Ampicillin/Sulbactam Susceptible     Cefazolin ($) Susceptible     Cefepime Susceptible     Ceftazidime ($) Susceptible     Ceftriaxone ($) Susceptible     Ciprofloxacin ($) Susceptible     Gentamicin ($) Susceptible     Imipenem Susceptible     Levofloxacin ($) Susceptible     Nitrofurantoin Intermediate     Piperacillin/Tazobactam Susceptible     Tobramycin ($) Susceptible     Trimethoprim/Sulfa Susceptible                Susceptibility      Escherichia coli     MIC     Ampicillin ($) Intermediate     Ampicillin/Sulbactam Susceptible     Cefazolin ($) Susceptible     Cefepime Susceptible     Ceftazidime ($) Susceptible     Ceftriaxone ($) Susceptible     Ciprofloxacin ($) Resistant     Gentamicin ($) Susceptible     Imipenem Susceptible     Levofloxacin ($) Resistant     Nitrofurantoin Susceptible     Piperacillin/Tazobactam  Susceptible     Tobramycin ($) Susceptible     Trimethoprim/Sulfa Susceptible                Susceptibility Comments     Klebsiella pneumoniae ssp pneumoniae    See Results    Escherichia coli    See Results             LEGIONELLA PNEUMOPHILA AG, URINE [id number] Collected:  01/10/19 1305    Order Status:  Completed Specimen:  Urine Updated:  01/10/19 1439     Legionella Ag, urine NEGATIVE        Comment: Presumptive negative for L. pneumophila serogroup 1 antigen in urine, suggesting no recent or  current infection. Infection due to Legionella cannot be ruled out since other serogroups and  species may cause disease, antigen may not be present in urine in early infection and the level  of antigen present in the urine may be below the detection limit of the test.          S.Judie Petit, UR/CSF [id number]  (Abnormal) Collected:  01/10/19 1305    Order Status:  Completed Specimen:  Urine Updated:  01/10/19 1438     Strep pneumo Ag, urine POSITIVE       CULTURE, BLOOD [id number] Collected:  01/10/19 1147    Order Status:  Completed Specimen:  Blood Updated:  01/14/19 0704     Blood Culture Result No Growth At 72 Hours       CULTURE, BLOOD [id number] Collected:  01/10/19 1130    Order Status:  Completed Specimen:  Blood Updated:  01/14/19 0704     Blood Culture Result No Growth At 72 Hours       SARS-COV-2_FLU [id number]  (Abnormal) Collected:  01/10/19 1050    Order Status:  Completed Specimen:  NASOPHARYNGEAL SWAB Updated:  01/10/19 1230     COVID-19 POSITIVE        Comment: Negative results do not preclude SARS-CoV-2 infection and should not be used as the sole basis  for treatment or other patient management decisions. Negative results must be combined with  clinical observations, patient history, and epidemiological information.  Testing with the Xpert Xpress SARS-CoV-2 test is intended for use by trained operators who are  proficient in performing tests using either GeneXpert Dx, GeneXpert Infinity and/or [employer]. The Xpert Xpress SARS-CoV-2 test is only for use under the Food and Drug  Administration's Emergency Use Authorization.  THE  pos sars cov2_  RESULT WAS CALLED TO (AND READ BACK BY) MEGAN, ER _.             THE NOTIFICATION WAS MADE ON _11/15/20   BY _BD         CULTURE, RESPIRATORY/SPUTUM/BRONCH [name] [id number]     Order Status:  No result Specimen:  Sputum         XR Results (most recent):  Results from Hospital Encounter encounter on 01/10/19   XR CHEST SNGL V    Narrative INDICATION: hypoxia, multifocal pneumonia, covid-19 positive. Dyspnea  COMPARISON: 01/10/2019  TECHNIQUE: Single view chest.      Impression IMPRESSION:  The patient is status post sternotomy. Heart is mildly enlarged. Mild vascular   congestion. There are bilateral infiltrates in the mid to lower lung zones,  increased since prior radiograph.             Imaging:  [x] I have personally reviewed the patient???s radiographs  [] Radiographs reviewed with radiologist   [] No  change from prior, tubes and lines in adequate position  []Improved   []Worsening        Donavan Foil, MD

## 2019-01-14 NOTE — Progress Notes (Signed)
Progress  Notes by [name], MD at 01/14/19 1219                Author: [name], MD  Service: Infectious Disease  Author Type: Physician       Filed: 01/14/19 1224  Date of Service: 01/14/19 1219  Status: Signed          Editor: [name], MD (Physician)                                                                                                                                                            INFECTIOUS DISEASE FOLLOW UP NOTE       Date of admission: 01/10/2019   ??   Date of consult: January 13, 2019   ??   ??     ABX:     ??      Current abx  Prior abx      Zithromax 11/15-4   Rocephin 11/15-4   ??   Decadron 11/15-4   Remdesivir 11/15-4   Convalescent plasma 11/15  ??     ??     ASSESSMENT:      ??     Pneumonia--Strep Pneumonia Urine ag +     Viral pneumonia--COVID-19 positive     UTI   UA unrevealing   Urine culture positive clubs and E. coli     History of left hydronephrosis due to renal calculi   06/22/18 JJ stent placement   07/31/2018 cystoscopy left retrograde pyelogram left ureteroscopy laser lithotripsy proximal ureteral stone and extraction of left renal calculi and exchange of double-J stents   08/06/2018 during stent removed     CAD s/p CABG     Pancreatic cancer   2006 for procedure, radiation, chemo     Gout     Hypogonadism     ??     ??   ??   ??     RECOMMENDATIONS:     Tommy Brown is a 73 year old male with past medical history of hypertension, CAD, prostate cancer, hydronephrosis requiring stent was admitted for shortness  of breath, positive COVID-19 and multifocal pneumonia   ??   -Patient with multifocal pneumonia on chest x-ray   -Positive for COVID-19   -Patient also with positive strep pneumo antigen   -Check sputum culture   -Cont IV remdesivir, steroids   -Pt s/p convalescent plasma   -Continue on broad-spectrum antibiotics with ceftriaxone and azithromycin and plan short course   -Watch for any worsening   -Inflammatory markers mildly elevated D-dimer of 0.7   ??    -Patient's urine culture with Klebsiella and E. Coli   -Given presence of stent, will need renal ultrasound   -Monitor labs and cultures   -Plan discussed with [name]          ??  COVID-19 Lab test monitoring:     Blood type: A Rh Negative   Pregnancy:   Quantiferon TB gold:   ??   ??   Labs:                  Recent Labs            01/12/19   0527  01/12/19   0417     PTEMPI  98.0 F  98.0 F     O2ST  96.0  89.0*     FIO2  80   --      ??                        Recent Labs              01/13/19   0639  01/12/19   0321  01/10/19   2351  01/10/19   1711     WBC  6.8  4.8  2.7*  3.4*     CREA  1.4*  1.2  1.2  1.2     PLT  169  138*  130*  128*     AST  122*  72*  54*  64*     ALT  71  35  24  27     ??                  Recent Labs            01/13/19   0948  01/12/19   0321     CRP  29.6*  81.2*     FERR  185.3  190.8     LDH   --   337*     DDIME  0.70*  0.52*     FIBRN   --   443*     TRIGL   --   105     ??   ??                    MICROBIOLOGY:     11/15   BC x2 NGTD               UC Klebsiella pneumonia and E. Coli               Strep pneumonia urine antigen positive               Legionella urine antigen negative               19+   ??     LINES AND CATHETERS:     PIV   ??        SUBJECTIVE :        Interval notes reviewed.  Pt afebrile. He is lying on his stomach. Feels sig better than before. Updates given. No n, v or diarrhea.        OBJECTIVE        Visit Vitals      BP  (!) 113/49 (BP Patient Position: Supine)     Pulse  72     Temp  97.3 ??F (36.3 ??C)     Resp  20     Wt  61.2 kg (135 lb)     SpO2  94%        BMI  20.53 kg/m??           Temp (24hrs), Avg:98 ??F (36.7 ??C), Min:97.3 ??F (36.3 ??  C), Max:98.3 ??F (36.8 ??C)      General: No apparent distress   HEENT: Normocephalic, mucous membranes pink and moist anicteric.     Neck: Supple, no lymphadenopathy, masses or thyromegaly   Chest: Symmetrical expansion   Lungs:  Crackles   Heart: S1-S2 were distinct and regular grade 2/6 systolic murmur   Abdomen: Soft, tender  with active bowel sounds   Musculoskeletal: Normal strength/tone. No edema. No clubbing or cyanosis   CNS: Awake and alert, oriented x3.  Smile symmetric tongue midline   SKIN: No rash no jaundice           MEDICATIONS:          Current Facility-Administered Medications          Medication  Dose  Route  Frequency           ?  ketotifen (ZADITOR) 0.025 % (0.035 %) ophthalmic solution 1 Drop   1 Drop  Both Eyes  BID     ?  promethazine (PHENERGAN) 25 mg in NS IVPB   25 mg  IntraVENous  Q6H PRN           ?  allopurinoL (ZYLOPRIM) tablet 300 mg   300 mg  Oral  DAILY           ?  gabapentin (NEURONTIN) capsule 600 mg   600 mg  Oral  BID     ?  Saccharomyces boulardii (FLORASTOR) capsule 250 mg   250 mg  Oral  ACB&D     ?  lipase-protease-amylase (CREON 24,000) capsule 1 Cap   1 Cap  Oral  TID WITH MEALS     ?  metoprolol succinate (TOPROL-XL) XL tablet 25 mg   25 mg  Oral  DAILY     ?  polyvinyl alcohol-povidon(PF) (REFRESH CLASSIC) 1.4-0.6 % ophthalmic solution 2 Drop   2 Drop  Both Eyes  DAILY     ?  tamsulosin (FLOMAX) capsule 0.8 mg   0.8 mg  Oral  DAILY     ?  temazepam (RESTORIL) capsule 7.5 mg   7.5 mg  Oral  QHS PRN     ?  naloxone (NARCAN) injection 0.1 mg   0.1 mg  IntraVENous  PRN     ?  cefTRIAXone (ROCEPHIN) 1 g in 0.9% sodium chloride (MBP/ADV) 50 mL MBP   1 g  IntraVENous  Q24H     ?  ondansetron (ZOFRAN) injection 4 mg   4 mg  IntraVENous  Q4H PRN     ?  enoxaparin (LOVENOX) injection 40 mg   40 mg  SubCUTAneous  Q24H     ?  amLODIPine (NORVASC) tablet 10 mg   10 mg  Oral  DAILY     ?  azithromycin (ZITHROMAX) 500 mg in 0.9% sodium chloride 250 mL IVPB   500 mg  IntraVENous  Q24H     ?  hydrALAZINE (APRESOLINE) 20 mg/mL injection 10 mg   10 mg  IntraVENous  Q6H PRN     ?  0.9% sodium chloride infusion 250 mL   250 mL  IntraVENous  PRN           ?  dexamethasone (DECADRON) 4 mg/mL injection 6 mg   6 mg  IntraVENous  Q24H           ?  remdesivir 100 mg in 0.9% sodium chloride 250 mL IVPB   100 mg   IntraVENous  QHS           ?  ketorolac (TORADOL) injection 15 mg   15 mg  IntraVENous  Q8H PRN              Labs:  Results:        Chemistry  Recent Labs         01/14/19   0555  01/13/19   0639  01/12/19   0527    01/12/19   0321      GLU  144*  152*   --    --   184*      NA  143  144   --    --   142      K  4.3  4.4   --    --   4.4      CL  112*  114*   --    --   111*      CO2  27  24  23.0*    < >  25      BUN  26*  25   --    --   24      CREA  1.2  1.4*   --    --   1.2      CA  8.6  8.1*   --    --   8.6      AGAP  4*  7   --    --   6      AP  104  101   --    --   101      TP  7.0  6.9   --    --   6.9      ALB  2.6*  2.6*   --    --   2.5*       < > = values in this interval not displayed.                 CBC w/Diff  Recent Labs         01/14/19   0555  01/13/19   0639  01/12/19   0321      WBC  7.1  6.8  4.8      RBC  4.24  4.19  4.26      HGB  9.7*  9.5*  9.6*      HCT  31.7*  31.6*  32.1*      PLT  172  169  138*      GRANS  83.7*  86.0*  82.5*      LYMPH  9.8*  7.7*  11.9*      EOS  0.0  0.0  0.0                      RADIOLOGY :            Imaging     Results from Hospital Encounter encounter on 01/10/19     XR CHEST SNGL V           Narrative  INDICATION: hypoxia, multifocal pneumonia, covid-19 positive. Dyspnea   COMPARISON: 01/10/2019   TECHNIQUE: Single view chest.              Impression  IMPRESSION:   The patient is status post sternotomy. Heart is mildly enlarged. Mild vascular   congestion. There are bilateral infiltrates in the mid to lower lung zones,   increased since prior radiograph.  Results from [facility] encounter on 06/22/18     CT ABD PELV WO CONT           Narrative  CT Abdomen and Pelvis without      Indication: Left hydronephrosis. Evaluate for obstructing stone or lesion.      Comparison: Ultrasound 06/22/2018. CT 05/21/2018.      TECHNIQUE:    CT of the abdomen and pelvis WITHOUT intravenous contrast. Coronal and sagittal   reformations were obtained.           All CT exams at this facility use one or more dose reduction techniques   including automatic exposure control, mA/kV adjustment per patient's size, or   iterative reconstruction technique. DICOM format imaged data is available to   non-affiliated external healthcare facilities or entities on a secure, media   free, reciprocal searchable basis with patient authorization for 12 months   following the date of the study.      DISCUSSION:      ABSENCE OF INTRAVENOUS CONTRAST DECREASES SENSITIVITY FOR DETECTION OF FOCAL   LESIONS AND VASCULAR PATHOLOGY.      LOWER THORAX: Coronary artery calcifications.      HEPATOBILIARY: Multiple subcentimeter foci of hypoattenuation, too small to   characterize but unchanged. Cholecystectomy. Left intrahepatic pneumobilia,   unchanged.   SPLEEN: No splenomegaly.   PANCREAS: Postoperative changes from prior Whipple procedure. No mass or duct   dilatation in the residual pancreatic tail.      ADRENALS: No adrenal nodules.   KIDNEYS/URETERS: Multiple bilateral renal cysts. Multiple small (2 to 4 mm)   nonobstructing bilateral renal calculi. There is a 10 x 9 mm nonobstructing   calculus in the lower pole of the left kidney. Moderate left hydronephrosis and   hydroureter. There is a 8 x 7 mm obstructing calculus in the proximal left   ureter. No right hydronephrosis. No suspicious renal lesions.   PELVIC ORGANS/BLADDER: Enlarged prostate. Penile implant. The bladder is   unremarkable.      PERITONEUM / RETROPERITONEUM: No free air or fluid.   LYMPH NODES: No lymphadenopathy.   VESSELS: Extensive atherosclerotic vascular calcifications. Infrarenal abdominal   aorta ectasia.      GI TRACT: No distention or wall thickening. Diverticulosis. Normal appendix.      BONES AND SOFT TISSUES: Posterior lumbar fixation at L4-5. Mild retrolisthesis   at L2-3 and L3-4. Moderate to advanced disc height loss at L2-3. Mild   spondylosis of the remaining lumbar spine.              Impression   IMPRESSION:      1.  Obstructing 8 mm calculus in the left proximal ureter causing moderate left   hydronephrosis.   2.  Multiple additional nonobstructing bilateral renal calculi.   3.  Multiple bilateral renal cysts.   4.  Diverticulosis.   5.  Postoperative changes from prior Whipple procedure.   6.  Multiple hypoattenuating liver lesions, unchanged.           I have independently examined the patient and reviewed lab studies and imgaing as well as review of nursing notes and physican notes from the past 24 hours.      Dragon medical dictation software was used for portions of this report. Unintended errors may occur.       [name], MD   January 14, 2019      [place]   [phone]

## 2019-01-14 NOTE — Progress Notes (Signed)
Medical Progress Note      NAME: Tommy Brown   DOB:  1945-07-11  MRN:             [id number]    Date:  01/14/2019        Assessment:     1. Acute hypoxic respiratory failure  2. COVID-19 infection  3. Multifocal pneumonia  4. Pancytopenia  5. UTI with Klebsiella pneumoniae  6. Hypertension  7. CAD status post CABG  8. Gout  9. Pancreatic cancer  10. Neuropathy    Plan:     ?? Continue supplemental oxygen and titrate as needed   ?? Encourage prone positioning  ?? Continue dexamethasone daily complete 10 days course of treatment  ?? Patient already received convalescent plasma and completed remdesivir  ?? Continue antibiotics with ceftriaxone and Zithromax   ?? Pulmonary and ID also following   ?? Discussed with the patient   ?? Discussed with and updated his wife    Subjective:     Shortness of breathing, denies fever    Objective:     Vitals:    Last 24hrs VS reviewed since prior progress note. Most recent are:  Visit Vitals  BP (!) 113/49 (BP Patient Position: Supine)   Pulse 72   Temp 97.3 ??F (36.3 ??C)   Resp 20   Wt 61.2 kg (135 lb)   SpO2 94%   BMI 20.53 kg/m??     SpO2 Readings from Last 6 Encounters:   01/14/19 94%   07/31/18 98%   07/17/18 99%   06/26/18 97%   01/21/18 96%    O2 Flow Rate (L/min): 15 l/min       Intake/Output Summary (Last 24 hours) at 01/14/2019 1437  Last data filed at 01/14/2019 1317  Gross per 24 hour   Intake 910 ml   Output 850 ml   Net 60 ml        Exam:     General:   Not in acute distress  HEENT: PERRLA, Neck Supple,  No JVD  Respiratory:   CTA bilaterally-no wheezes, rales, rhonchi, or crackles  Cardiac:  Regular Rate and Rythmn  - no murmurs, rubs or gallops  Abdominal:  Soft, non-tender, non-distended, positive bowel sounds  Extremities:  No cyanosis, or edema.  Skin: No rash  Neurological:  No focal neurological deficits    Medication:   Current Medications Reviewed    Current Facility-Administered Medications:   ???  ketotifen (ZADITOR) 0.025 % (0.035 %) ophthalmic  solution 1 Drop, 1 Drop, Both Eyes, BID, Stanton Kidney, MD, 1 Drop at 01/14/19 0801  ???  promethazine (PHENERGAN) 25 mg in NS IVPB, 25 mg, IntraVENous, Q6H PRN, Stanton Kidney, MD, Last Rate: 200 mL/hr at 01/11/19 1955, 25 mg at 01/11/19 1955  ???  allopurinoL (ZYLOPRIM) tablet 300 mg, 300 mg, Oral, DAILY, Cynthia Cogle, Tami Ribas, MD, 300 mg at 01/14/19 0800  ???  gabapentin (NEURONTIN) capsule 600 mg, 600 mg, Oral, BID, Shandie Bertz, Tami Ribas, MD, 600 mg at 01/14/19 0800  ???  Saccharomyces boulardii (FLORASTOR) capsule 250 mg, 250 mg, Oral, ACB&D, Stanton Kidney, MD, 250 mg at 01/14/19 0640  ???  lipase-protease-amylase (CREON 24,000) capsule 1 Cap, 1 Cap, Oral, TID WITH MEALS, Theora Vankirk, Tami Ribas, MD, 1 Cap at 01/14/19 1159  ???  metoprolol succinate (TOPROL-XL) XL tablet 25 mg, 25 mg, Oral, DAILY, Abdinasir Spadafore, Tami Ribas, MD, 25 mg at 01/14/19 0800  ???  polyvinyl alcohol-povidon(PF) (REFRESH CLASSIC) 1.4-0.6 % ophthalmic solution  2 Drop, 2 Drop, Both Eyes, DAILY, Abdullah Rizzi, Tami Ribas, MD, 2 Drop at 01/14/19 0800  ???  tamsulosin (FLOMAX) capsule 0.8 mg, 0.8 mg, Oral, DAILY, Hula Tasso, Tami Ribas, MD, 0.8 mg at 01/14/19 0800  ???  temazepam (RESTORIL) capsule 7.5 mg, 7.5 mg, Oral, QHS PRN, Stanton Kidney, MD, 7.5 mg at 01/13/19 2008  ???  naloxone (NARCAN) injection 0.1 mg, 0.1 mg, IntraVENous, PRN, Delene Loll, Tami Ribas, MD  ???  cefTRIAXone (ROCEPHIN) 1 g in 0.9% sodium chloride (MBP/ADV) 50 mL MBP, 1 g, IntraVENous, Q24H, Twanda Stakes, Tami Ribas, MD, Stopped at 01/13/19 1729  ???  ondansetron (ZOFRAN) injection 4 mg, 4 mg, IntraVENous, Q4H PRN, Delene Loll, Tami Ribas, MD  ???  enoxaparin (LOVENOX) injection 40 mg, 40 mg, SubCUTAneous, Q24H, Valeria Boza, Tami Ribas, MD, 40 mg at 01/13/19 1647  ???  amLODIPine (NORVASC) tablet 10 mg, 10 mg, Oral, DAILY, Vignesh Willert, Tami Ribas, MD, 10 mg at 01/14/19 0800  ???  azithromycin (ZITHROMAX) 500 mg in 0.9% sodium chloride 250 mL IVPB, 500 mg, IntraVENous, Q24H, Eitan Doubleday, Tami Ribas, MD, Last Rate: 250 mL/hr at 01/13/19 2008, 500 mg at 01/13/19 2008  ???  hydrALAZINE  (APRESOLINE) 20 mg/mL injection 10 mg, 10 mg, IntraVENous, Q6H PRN, Zaden Sako, Tami Ribas, MD  ???  0.9% sodium chloride infusion 250 mL, 250 mL, IntraVENous, PRN, Mars Scheaffer, Tami Ribas, MD  ???  dexamethasone (DECADRON) 4 mg/mL injection 6 mg, 6 mg, IntraVENous, Q24H, Oral Hallgren, Tami Ribas, MD, 6 mg at 01/13/19 1709  ???  [COMPLETED] remdesivir 200 mg in 0.9% sodium chloride 250 mL IVPB, 200 mg, IntraVENous, ONCE, Stopped at 01/10/19 1855 **FOLLOWED BY** remdesivir 100 mg in 0.9% sodium chloride 250 mL IVPB, 100 mg, IntraVENous, QHS, Naomee Nowland, Tami Ribas, MD, 100 mg at 01/13/19 2256  ???  ketorolac (TORADOL) injection 15 mg, 15 mg, IntraVENous, Q8H PRN, Stanton Kidney, MD, 15 mg at 01/12/19 3762      Lab:     Lab Data Reviewed: (see below)  Recent Results (from the past 24 hour(s))   CBC WITH AUTOMATED DIFF    Collection Time: 01/14/19  5:55 AM   Result Value Ref Range    WBC 7.1 4.0 - 11.0 1000/mm3    RBC 4.24 3.80 - 5.70 M/uL    HGB 9.7 (L) 12.4 - 17.2 gm/dl    HCT 31.7 (L) 37.0 - 50.0 %    MCV 74.8 (L) 80.0 - 98.0 fL    MCH 22.9 (L) 23.0 - 34.6 pg    MCHC 30.6 30.0 - 36.0 gm/dl    PLATELET 172 140 - 450 1000/mm3    RDW-SD 58.2 (H) 35.1 - 43.9      IMMATURE GRANULOCYTES 1.4 0.0 - 3.0 %    NEUTROPHILS 83.7 (H) 34 - 64 %    LYMPHOCYTES 9.8 (L) 28 - 48 %    MONOCYTES 5.1 1 - 13 %    EOSINOPHILS 0.0 0 - 5 %    BASOPHILS 0.0 0 - 3 %   METABOLIC PANEL, COMPREHENSIVE    Collection Time: 01/14/19  5:55 AM   Result Value Ref Range    Sodium 143 136 - 145 mEq/L    Potassium 4.3 3.5 - 5.1 mEq/L    Chloride 112 (H) 98 - 107 mEq/L    CO2 27 21 - 32 mEq/L    Glucose 144 (H) 74 - 106 mg/dl    BUN 26 (H) 7 - 25 mg/dl    Creatinine 1.2 0.6 - 1.3 mg/dl  GFR est AA >60      GFR est non-AA >60      Calcium 8.6 8.5 - 10.1 mg/dl    AST (SGOT) 139 (H) 15 - 37 U/L    ALT (SGPT) 108 (H) 12 - 78 U/L    Alk. phosphatase 104 45 - 117 U/L    Bilirubin, total 0.5 0.2 - 1.0 mg/dl    Protein, total 7.0 6.4 - 8.2 gm/dl    Albumin 2.6 (L) 3.4 - 5.0 gm/dl    Anion gap 4 (L)  5 - 15 mmol/L   D DIMER    Collection Time: 01/14/19  5:55 AM   Result Value Ref Range    D DIMER 0.67 (H) 0.01 - 0.50 ug/mL (FEU)   FERRITIN    Collection Time: 01/14/19  5:55 AM   Result Value Ref Range    Ferritin 153.6 26.0 - 388.0 ng/ml   C REACTIVE PROTEIN, QT    Collection Time: 01/14/19  5:55 AM   Result Value Ref Range    C-Reactive protein 21.9 (H) 0.0 - 2.9 mg/L   MAGNESIUM    Collection Time: 01/14/19  5:55 AM   Result Value Ref Range    Magnesium 2.2 1.6 - 2.6 mg/dl       No results found.    Active Problems:    Pneumonia due to COVID-19 virus (01/10/2019)      ____________________________________________________________________      Attending Physician: Stanton Kidney, MD       Dragon medical dictation software was used for portions of this report. Unintended voice recognition errors may occur.

## 2019-01-14 NOTE — Progress Notes (Signed)
[facility] Pharmacy Dosing Services: IV to PO Conversion    The pharmacist has determined that this patient meets P & T approved criteria for conversion from IV to oral therapy for dexamethasone.    The pharmacist has written the following order for the patient: change dexamethasone IV to PO to complete a total of 10 days.    The pharmacist will continue to monitor the patient's status and advise the physician if conversion back to IV therapy is recommended.    Signed Joesph July, PHARMD

## 2019-01-14 NOTE — Progress Notes (Signed)
Progress Notes by Donavan Foil, MD at 01/14/19 [phone]                Author: Donavan Foil, MD  Service: Pulmonary Disease  Author Type: Physician       Filed: 01/14/19 1139  Date of Service: 01/14/19 0850  Status: Signed          Editor: Donavan Foil, MD (Physician)                       [facility]            Name:  Tommy Brown  MRN:  [id number]          DOB:  November 04, 1945  Hospital:  [facility]     Date:  01/14/2019               IMPRESSION:     ??    Acute hypoxemic respiratory failure due to COVID-19 pneumonitis   ??  COVID-19 pneumonitis   ??  Streptococcal pneumoniae pneumonia, positive strep pneumo urine antigen   ??  Urine culture growing Klebsiella  and E-coli sensitive to cephalosporin   ??  Hypertension   ??  BPH   ??  History of pancreatic cancer   ??  History of left hydronephrosis and stent placement               PLAN:     ??    Continue supplemental oxygen through [place] nasal cannula (15 lit/min).  Titrate, and target oxygen saturation greater than  88%.   ??  Continue proning and deep breathing exercise.   ??  Continue remdesivir for 5 days.  Continue dexamethasone 6 mg IV daily for total 10 days.  S/p convalescent plasma.   ??  Continue azithromycin and ceftriaxone. Follow ID recommendation    ??  Monitor inflammatory markers-slow improvement.  Monitor D-dimer level-relatively stable.   ??  Continue subcu Lovenox for DVT prophylaxis          Subjective/Interval History:        No significant change in patient's overall oxygen requirement.      Review of Systems    Constitutional: Negative.     HENT: Negative.     Eyes: Negative.     Respiratory: Positive for shortness of breath.     Cardiovascular: Negative.     Gastrointestinal: Negative.     Genitourinary: Negative.     Musculoskeletal: Negative.     Skin: Negative.     Neurological: Negative.     Endo/Heme/Allergies: Negative.     Psychiatric/Behavioral: Negative.              Objective:     Vital Signs:       Visit Vitals   BP  124/65 (BP 1 Location: Left arm, BP Patient Position: Prone)      Pulse  73      Temp  97.8 ??F (36.6 ??C)      Resp  20      Wt  61.2 kg (135 lb)      SpO2  90%  Comment: pt laying on right side      BMI  20.53 kg/m??                O2 Device: Hi flow nasal cannula(salter)     O2 Flow Rate (L/min): 15 l/min     Temp (24hrs), Avg:98 ??F (36.7 ??C), Min:97.6 ??  F (36.4 ??C), Max:98.3 ??F (36.8 ??C)           Intake/Output:    Last shift:      No intake/output data recorded.   Last 3 shifts: 11/17 1901 - 11/19 0700   In: 1390 [P.O.:340; I.V.:1050]   Out: 1150 [Urine:1150]      Intake/Output Summary (Last 24 hours) at 01/14/2019 0850   Last data filed at 01/13/2019 2256     Gross per 24 hour        Intake  770 ml        Output  500 ml        Net  270 ml            Physical Exam   Vitals signs and nursing note reviewed.   HENT :       Mouth/Throat:      Mouth: Mucous membranes are moist.   Eyes :       Pupils: Pupils are equal, round, and reactive to light.   Cardiovascular:       Rate and Rhythm: Regular rhythm.   Pulmonary :       Breath sounds: No wheezing.   Abdominal:      General: Abdomen is flat.      Palpations: Abdomen is soft.      Tenderness: There is no  abdominal tenderness.     Musculoskeletal:          General: No swelling.    Skin:      General: Skin is warm and dry.   Neurological :       General: No focal deficit present.      Mental Status: He is alert.                 DATA:   Labs:     Recent Labs             01/14/19   0555  01/13/19   0639  01/12/19   0321     WBC  7.1  6.8  4.8     HGB  9.7*  9.5*  9.6*     HCT  31.7*  31.6*  32.1*          PLT  172  169  138*          Recent Labs               01/14/19   0555  01/13/19   0639  01/12/19   0527    01/12/19   0321     NA  143  144   --    --   142     K  4.3  4.4   --    --   4.4     CL  112*  114*   --    --   111*     CO2  27  24  23.0*    < >  25     GLU  144*  152*   --    --   184*     BUN  26*  25    --    --   24     CREA  1.2  1.4*   --    --   1.2     CA  8.6  8.1*   --    --   8.6     MG  2.2  2.2   --    --   2.1     ALB  2.6*  2.6*   --    --   2.5*     ALT  108*  71   --    --   35        < > = values in this interval not displayed.          Recent Labs            01/12/19   0527  01/12/19   0417     PH  7.468*  7.440     PCO2  30.3*  32.5*     PO2  73.0*  53.0*     HCO3  22.0  22.1         FIO2  80   --                  Results               Procedure  Component  Value  Units  Date/Time           CULTURE, RESPIRATORY/SPUTUM/BRONCH Sid Falcon STAIN [id number]              Order Status:  No result  Specimen:  Sputum             CULTURE, URINE [id number]  (Abnormal)  (Susceptibility)  Collected:  01/10/19 1305            Order Status:  Completed  Specimen:  Urine from Clean catch  Updated:  01/12/19 1841                Isolate  --                    >100,000 CFU/mL   Klebsiella pneumoniae ssp pneumoniae                     Isolate  --                    50,000 CFU/mL   Escherichia coli               Susceptibility           Klebsiella pneumoniae ssp pneumoniae          MIC          Ampicillin ($)  Resistant          Ampicillin/Sulbactam  Susceptible          Cefazolin ($)  Susceptible          Cefepime  Susceptible          Ceftazidime ($)  Susceptible          Ceftriaxone ($)  Susceptible          Ciprofloxacin ($)  Susceptible          Gentamicin ($)  Susceptible          Imipenem  Susceptible          Levofloxacin ($)  Susceptible          Nitrofurantoin  Intermediate          Piperacillin/Tazobactam  Susceptible          Tobramycin ($)  Susceptible          Trimethoprim/Sulfa  Susceptible  Susceptibility           Escherichia coli          MIC          Ampicillin ($)  Intermediate          Ampicillin/Sulbactam  Susceptible          Cefazolin ($)  Susceptible          Cefepime  Susceptible          Ceftazidime ($)  Susceptible          Ceftriaxone ($)  Susceptible           Ciprofloxacin ($)  Resistant          Gentamicin ($)  Susceptible          Imipenem  Susceptible          Levofloxacin ($)  Resistant          Nitrofurantoin  Susceptible          Piperacillin/Tazobactam  Susceptible          Tobramycin ($)  Susceptible          Trimethoprim/Sulfa  Susceptible                                Susceptibility Comments         Klebsiella pneumoniae ssp pneumoniae        See Results        Escherichia coli        See Results                                LEGIONELLA PNEUMOPHILA AG, URINE [id number]  Collected:  01/10/19 1305            Order Status:  Completed  Specimen:  Urine  Updated:  01/10/19 1439                Legionella Ag, urine  NEGATIVE                  Comment:  Presumptive negative for L. pneumophila serogroup 1 antigen in urine, suggesting no recent or   current infection. Infection due to Legionella cannot be ruled out since other serogroups and   species may cause disease, antigen may not be present in urine in early infection and the level   of antigen present in the urine may be below the detection limit of the test.                        S.PNEUMO AG, UR/CSF [id number]  (Abnormal)  Collected:  01/10/19 1305            Order Status:  Completed  Specimen:  Urine  Updated:  01/10/19 1438                Strep pneumo Ag, urine  POSITIVE                  CULTURE, BLOOD [id number]  Collected:  01/10/19 1147            Order Status:  Completed  Specimen:  Blood  Updated:  01/14/19 0704                Blood Culture Result  No Growth At 72 Hours  CULTURE, BLOOD [id number]  Collected:  01/10/19 1130            Order Status:  Completed  Specimen:  Blood  Updated:  01/14/19 0704                Blood Culture Result  No Growth At 72 Hours                SARS-COV-2_FLU [id number]  (Abnormal)  Collected:  01/10/19 1050            Order Status:  Completed  Specimen:  NASOPHARYNGEAL SWAB  Updated:  01/10/19 1230                COVID-19  POSITIVE                     Comment:  Negative results do not preclude SARS-CoV-2 infection and should not be used as the sole basis   for treatment or other patient management decisions. Negative results must be combined with   clinical observations, patient history, and epidemiological information.   Testing with the Xpert Xpress SARS-CoV-2 test is intended for use by trained operators who are   proficient in performing tests using either GeneXpert Dx, GeneXpert Infinity and/or [employer]. The Xpert Xpress SARS-CoV-2 test is only for use under the Food and Drug   Administration's Emergency Use Authorization.   THE  pos sars cov2_  RESULT WAS CALLED TO (AND READ BACK BY) MEGAN, ER _.              THE NOTIFICATION WAS MADE ON _11/15/20   BY _BD                        CULTURE, RESPIRATORY/SPUTUM/BRONCH [name] [id number]              Order Status:  No result  Specimen:  Sputum               XR Results (most recent):     Results from Hospital Encounter encounter on 01/10/19     XR CHEST SNGL V           Narrative  INDICATION: hypoxia, multifocal pneumonia, covid-19 positive. Dyspnea   COMPARISON: 01/10/2019   TECHNIQUE: Single view chest.              Impression  IMPRESSION:   The patient is status post sternotomy. Heart is mildly enlarged. Mild vascular   congestion. There are bilateral infiltrates in the mid to lower lung zones,   increased since prior radiograph.                    Imaging:   [x]  I have personally reviewed the patients radiographs   []  Radiographs reviewed with radiologist    []  No change from prior, tubes and lines in adequate position   [] Improved   [] Worsening            Donavan Foil, MD

## 2019-01-14 NOTE — Progress Notes (Signed)
Problem: Falls - Risk of  Goal: *Absence of Falls  Description: Document Tommy Brown Fall Risk and appropriate interventions in the flowsheet.  Outcome: Progressing Towards Goal  Note: Fall Risk Interventions:  Mobility Interventions: Bed/chair exit alarm, Patient to call before getting OOB         Medication Interventions: Bed/chair exit alarm, Patient to call before getting OOB, Teach patient to arise slowly    Elimination Interventions: Bed/chair exit alarm, Call light in reach, Urinal in reach              Problem: Patient Education: Go to Patient Education Activity  Goal: Patient/Family Education  Outcome: Progressing Towards Goal     Problem: Risk for Spread of Infection  Goal: Prevent transmission of infectious organism to others  Description: Prevent the transmission of infectious organisms to other patients, staff members, and visitors.  Outcome: Progressing Towards Goal     Problem: Patient Education:  Go to Education Activity  Goal: Patient/Family Education  Outcome: Progressing Towards Goal     Problem: Gas Exchange - Impaired  Goal: *Absence of hypoxia  Outcome: Progressing Towards Goal     Problem: Patient Education: Go to Patient Education Activity  Goal: Patient/Family Education  Outcome: Progressing Towards Goal     Problem: Pressure Injury - Risk of  Goal: *Prevention of pressure injury  Description: Document Braden Scale and appropriate interventions in the flowsheet.  Outcome: Progressing Towards Goal  Note: Pressure Injury Interventions:  Sensory Interventions: Assess changes in LOC, Keep linens dry and wrinkle-free    Moisture Interventions: Absorbent underpads, Apply protective barrier, creams and emollients    Activity Interventions: Increase time out of bed    Mobility Interventions: HOB 30 degrees or less    Nutrition Interventions: Document food/fluid/supplement intake                     Problem: Patient Education: Go to Patient Education Activity  Goal: Patient/Family Education  Outcome:  Progressing Towards Goal

## 2019-01-15 LAB — METABOLIC PANEL, BASIC
Anion gap: 6 mmol/L (ref 5–15)
BUN: 30 mg/dl — ABNORMAL HIGH (ref 7–25)
CO2: 26 mEq/L (ref 21–32)
Calcium: 8.4 mg/dl — ABNORMAL LOW (ref 8.5–10.1)
Chloride: 110 mEq/L — ABNORMAL HIGH (ref 98–107)
Creatinine: 1.2 mg/dl (ref 0.6–1.3)
GFR est AA: >60
GFR est non-AA: >60
Glucose: 142 mg/dl — ABNORMAL HIGH (ref 74–106)
Potassium: 4.1 mEq/L (ref 3.5–5.1)
Sodium: 142 mEq/L (ref 136–145)

## 2019-01-15 LAB — CBC WITH AUTOMATED DIFF
BASOPHILS: 0.9 % (ref 0–3)
EOSINOPHILS: 0.1 % (ref 0–5)
HCT: 29.5 % — ABNORMAL LOW (ref 37.0–50.0)
HGB: 9.3 gm/dl — ABNORMAL LOW (ref 12.4–17.2)
LYMPHOCYTES: 8.5 % — ABNORMAL LOW (ref 28–48)
MCH: 23.1 pg (ref 23.0–34.6)
MCHC: 31.5 gm/dl (ref 30.0–36.0)
MCV: 73.2 fL — ABNORMAL LOW (ref 80.0–98.0)
MONOCYTES: 4.8 % (ref 1–13)
NEUTROPHILS: 85.7 % — ABNORMAL HIGH (ref 34–64)
PLATELET: 191 10*3/uL (ref 140–450)
RBC Morphology: NORMAL
RBC: 4.03 M/uL (ref 3.80–5.70)
RDW-SD: 57.2 — ABNORMAL HIGH (ref 35.1–43.9)
WBC: 6.9 10*3/uL (ref 4.0–11.0)

## 2019-01-15 LAB — D DIMER: D DIMER: 0.8 ug/mL (FEU) — ABNORMAL HIGH (ref 0.01–0.50)

## 2019-01-15 LAB — MAGNESIUM
Magnesium: 2.4 mg/dL (ref 1.6–2.6)
Magnesium: 2.4 mg/dl (ref 1.6–2.6)

## 2019-01-15 LAB — FERRITIN
Ferritin: 126 ng/mL (ref 26.0–388.0)
Ferritin: 126 ng/ml (ref 26.0–388.0)

## 2019-01-15 LAB — C REACTIVE PROTEIN, QT: C-Reactive protein: 17.4 mg/L — ABNORMAL HIGH (ref 0.0–2.9)

## 2019-01-15 LAB — BASIC METABOLIC PANEL
Anion Gap: 6 mmol/L (ref 5–15)
BUN: 30 mg/dl — ABNORMAL HIGH (ref 7–25)
CO2: 26 mEq/L (ref 21–32)
Calcium: 8.4 mg/dl — ABNORMAL LOW (ref 8.5–10.1)
Chloride: 110 mEq/L — ABNORMAL HIGH (ref 98–107)
Creatinine: 1.2 mg/dl (ref 0.6–1.3)
EGFR IF NonAfrican American: >60
GFR African American: >60
Glucose: 142 mg/dl — ABNORMAL HIGH (ref 74–106)
Potassium: 4.1 mEq/L (ref 3.5–5.1)
Sodium: 142 mEq/L (ref 136–145)

## 2019-01-15 LAB — CBC WITH AUTO DIFFERENTIAL
Basophils %: 0.9 % (ref 0–3)
Eosinophils %: 0.1 % (ref 0–5)
Hematocrit: 29.5 % — ABNORMAL LOW (ref 37.0–50.0)
Hemoglobin: 9.3 gm/dl — ABNORMAL LOW (ref 12.4–17.2)
Lymphocytes %: 8.5 % — ABNORMAL LOW (ref 28–48)
MCH: 23.1 pg (ref 23.0–34.6)
MCHC: 31.5 gm/dl (ref 30.0–36.0)
MCV: 73.2 fL — ABNORMAL LOW (ref 80.0–98.0)
Monocytes %: 4.8 % (ref 1–13)
Neutrophils %: 85.7 % — ABNORMAL HIGH (ref 34–64)
Platelets: 191 10*3/uL (ref 140–450)
RBC MORPHOLOGY: NORMAL
RBC: 4.03 M/uL (ref 3.80–5.70)
RDW-SD: 57.2 — ABNORMAL HIGH (ref 35.1–43.9)
WBC: 6.9 10*3/uL (ref 4.0–11.0)

## 2019-01-15 LAB — D-DIMER, QUANTITATIVE: D-Dimer, Quant: 0.8 ug/mL (FEU) — ABNORMAL HIGH (ref 0.01–0.50)

## 2019-01-15 LAB — C-REACTIVE PROTEIN: CRP: 17.4 mg/L — ABNORMAL HIGH (ref 0.0–2.9)

## 2019-01-15 MED ORDER — FUROSEMIDE 10 MG/ML IJ SOLN
10 mg/mL | Freq: Once | INTRAMUSCULAR | Status: AC
Start: 2019-01-15 — End: 2019-01-15
  Administered 2019-01-15: 17:00:00 via INTRAVENOUS

## 2019-01-15 MED ORDER — LEVOFLOXACIN 750 MG TAB
750 mg | ORAL | Status: AC
Start: 2019-01-15 — End: 2019-01-19
  Administered 2019-01-15 – 2019-01-19 (×5): via ORAL

## 2019-01-15 MED FILL — FLORASTOR 250 MG CAPSULE: 250 mg | ORAL | Qty: 1

## 2019-01-15 MED FILL — AMLODIPINE 5 MG TAB: 5 mg | ORAL | Qty: 2

## 2019-01-15 MED FILL — DEXAMETHASONE 2 MG TAB: 2 mg | ORAL | Qty: 3

## 2019-01-15 MED FILL — LEVOFLOXACIN 750 MG TAB: 750 mg | ORAL | Qty: 1

## 2019-01-15 MED FILL — REMDESIVIR 100 MG INTRAVENOUS POWDER FOR SOLUTION: 100 mg | INTRAVENOUS | Qty: 100

## 2019-01-15 MED FILL — POLYVINYL ALCOHOL-POVIDONE 1.4 %-0.6 % EYE DROPPERETTE: OPHTHALMIC | Qty: 1

## 2019-01-15 MED FILL — ALLOPURINOL 300 MG TAB: 300 mg | ORAL | Qty: 1

## 2019-01-15 MED FILL — CREON 24,000-76,000-120,000 UNIT CAPSULE,DELAYED RELEASE: 24000-76000 -120,000 unit | ORAL | Qty: 1

## 2019-01-15 MED FILL — GABAPENTIN 300 MG CAP: 300 mg | ORAL | Qty: 2

## 2019-01-15 MED FILL — FUROSEMIDE 10 MG/ML IJ SOLN: 10 mg/mL | INTRAMUSCULAR | Qty: 2

## 2019-01-15 MED FILL — ENOXAPARIN 40 MG/0.4 ML SUB-Q SYRINGE: 40 mg/0.4 mL | SUBCUTANEOUS | Qty: 0.4

## 2019-01-15 MED FILL — TEMAZEPAM 7.5 MG CAP: 7.5 mg | ORAL | Qty: 1

## 2019-01-15 MED FILL — METOPROLOL SUCCINATE SR 25 MG 24 HR TAB: 25 mg | ORAL | Qty: 1

## 2019-01-15 MED FILL — TAMSULOSIN SR 0.4 MG 24 HR CAP: 0.4 mg | ORAL | Qty: 2

## 2019-01-15 NOTE — Progress Notes (Signed)
NUTRITION RECOMMENDATIONS:   ? Rec continue regular diet.  ? Rec Ensure Enlive TID (350 kcals, 20 g protein each)  ? Wts 3x/wk to trend.    NUTRITION INITIAL EVALUATION    NUTRITION ASSESSMENT:       Reason for Assessment: LOS    Admitting Diagnosis: Pneumonia due to COVID-19 virus [U07.1, J12.89]     PMH:   Past Medical History:   Diagnosis Date   ??? Arthritis    ??? Benign prostate hyperplasia    ??? Bladder infection    ??? Chronic kidney disease    ??? Coronary arteriosclerosis    ??? Coronary atherosclerosis of artery bypass graft    ??? Decreased testosterone level    ??? Diverticular disease    ??? Essential hypertension    ??? Gout    ??? History of acute renal failure    ??? History of anemia    ??? History of malignant neoplasm of pancreas    ??? Hx of CABG    ??? Kidney stone    ??? Kidney stones    ??? Neuropathy    ??? Pancreatic cancer ([place])    ??? Sepsis ([place])    ??? Tremor      Pertinent Medications: decadron, lovenox, gabapentin, creon, pobiotic    Pertinent Labs: 11/20 H/H 9.3/29.5 L, MCV 73.2 L, Glu 142 H, BUN 30 H, Ca 8.4 L, CRP 17.4 H (COVID)    Anthropometrics:  Height:   Ht Readings from Last 3 Encounters:   12/07/18 5\' 8"  (1.727 m)   08/06/18 5\' 8"  (1.727 m)   07/31/18 5\' 8"  (1.727 m)      Weight:   Wt Readings from Last 3 Encounters:   01/10/19 61.2 kg (135 lb)   12/07/18 61.2 kg (135 lb)   08/06/18 63.5 kg (140 lb)       BMI: Body mass index is 20.53 kg/m??.    IBW: Patient height not recorded           UBW: 135-140 lbs per pt report    Wt Change: Pt denies wt changes  []  significant   []  not significant [x]  stable   []  intended  []  not intended      GI Symptoms/Issues:  Pt denies N/V, abd pain, constipation/diarrhea.  Last Bowel Movement Date: 01/13/19  Stool Appearance: Loose  Abdominal Assessment: Soft  Chewing/Swallowing Issues:  None identified   Skin Integrity:  Intact   Fluid Accumulation:  None     Physical Assessment:   Muscle/Fat Wasting:   ? Pt not seen at this time in attempt to reduce unnecessary exposure, pt COVID+      Diet and Intake History:   Current Diet Order: DIET REGULAR    Food Allergies: NKFA    Diet/Intake History: Pt reports poor appetite/PO intake, consuming <50% of usual intake x 1 week PTA. Pt reports appetite increasing in house, but only able to consume ~50% of meals.   [x]  <50% intake x >5 days    Current Appetite/PO Intake: % Diet Eaten  Avg: 12.7 %  Min: 0 %  Max: 50 %.    Assessment of Current MNT: Diet is adequate if intake averages 75% at most meals - recommend adding oral supplement TID to increase protein-calorie intake opportunity.     Estimated Daily Nutrition Needs: 61.2 kg  ?? 1836 - 2142 kcals (30-35 kcal/kg)  ?? 73 - 92 g protein (1.2-1.5 g/kg)  ?? 1224 - 1530 mL fluid (20-25 ml/kg or per  MD)    NUTRITION DIAGNOSIS:     1. Inadequate energy intake related to COVID-19 as evidenced by pt consuming less than 50% of estimated needs X1 week PTA.    2. Increased energy expenditure related to metabolic demand for catabolic state as evidenced by COVID-19.    NUTRITION INTERVENTION / RECOMMENDATIONS:     ? Rec continue regular diet.  ? Rec Ensure Enlive TID (350 kcals, 20 g protein each)  ? Wts 3x/wk to trend.    NUTRITION MONITORING AND EVALUATION:     Nutrition Level of Care:   []  Low       [x]  Moderate      []  High    Monitoring and Evaluation: Wt trend, PO intake, nutrition-related labs, and s/sx of new skin concerns; will f/u per policy.    Nutrition Goals:  Pt to meet/tolerate >75% of estimated needs, maintain weight throughout LOS, BG control <180, maintain skin integrity.    Discharge Recommendations: Regular + ONS    Tana Conch, MS, RDN  01/15/19   Pager: [phone]  Office: [phone]

## 2019-01-15 NOTE — Progress Notes (Signed)
Problem: Gas Exchange - Impaired  Goal: *Absence of hypoxia  Outcome: Progressing Towards Goal     Problem: Patient Education: Go to Patient Education Activity  Goal: Patient/Family Education  Outcome: Progressing Towards Goal

## 2019-01-15 NOTE — Progress Notes (Addendum)
INFECTIOUS DISEASE FOLLOW UP NOTE       Infectious Disease Attending    I have reviewed Tommy Brown's  chart. I saw and examined the patient, personally performed the key portions of the service and discussed the management with the treatment team. I reviewed the NP's note and concur with the history, findings and plan of care and my edits as needed. Laboratory, hemodynamic and radiological data were independently reviewed.     -Patient afebrile, still requiring high flow  -BC normal and subjectively feeling better  -Creatinine normal and tolerating remdesivir-completed 5-day course  -Continue steroids for 10 days  -Given positive strep pneumo antigen, will switch to Levaquin and plan 5 more days  -Monitor labs and inflammatory markers  -Monitor for any worsening    We will see patient again on Monday. Please call if any urgent question or concern. I can be reached at  [phone].      [name], MD           ABX:   ??  Current abx Prior abx??   Levaquin 11/20-0  Decadron 11/15-5  Remdesivir 11/15-5-->complete  Convalescent plasma 11/15 ??Zithromax 11/15-5  Rocephin 11/15-5   ??  ASSESSMENT:??   ??  Pneumonia--Strep Pneumonia Urine ag +   Viral pneumonia--COVID-19 positive   UTI  UA unrevealing  Urine culture positive clubs and E. coli   History of left hydronephrosis due to renal calculi  06/22/18??JJ stent placement  07/31/2018 cystoscopy left retrograde pyelogram left ureteroscopy laser lithotripsy proximal ureteral stone and extraction of left renal calculi and exchange of double-J stents  08/06/2018 during stent removed   CAD s/p??CABG   Pancreatic cancer  2006 for procedure, radiation, chemo   Gout   Hypogonadism   ??   ??  ??  ??  RECOMMENDATIONS:    Tommy Brown is a 73 year old male with past medical history of hypertension, CAD, prostate cancer, hydronephrosis requiring stent was admitted for shortness of breath, positive COVID-19 and multifocal pneumonia  ??  -Afebrile, WBC normal  -Patient with multifocal pneumonia on chest x-ray  -Positive for COVID-19  -Patient also with positive strep pneumo antigen  -Was treated with 5 days of ceftriaxone, will start Levaquin and anticipate treating 5 more days to complete 10-day course for strep pneumonia ammonia  -Check sputum culture-->await collection  -Completed 5-day course IV Rocephin and Zithromax  -Pt s/p convalescent plasma-  -Continue on broad-spectrum antibiotics with ceftriaxone and azithromycin and plan short course  -Watch for any worsening  -Inflammatory markers slowly trending down  -D-dimer 0.8  ??  -Patient's urine culture with Klebsiella and E. Coli--> treated with short course of ceftriaxone    -Monitor labs and cultures  -Plan discussed with [name]    COVID-19 Lab test monitoring:   Blood type: A Rh Negative  Pregnancy:  Quantiferon TB gold:      Labs:  No results for input(s): PTEMPI, O2ST, FIO2 in the last 72 hours.  Recent Labs     01/15/19  0524 01/14/19  0555 01/13/19  0639   WBC 6.9 7.1 6.8   CREA 1.2 1.2 1.4*   PLT 191 172 169   AST  --  139* 122*   ALT  --  108* 71     Recent Labs     01/15/19  0524 01/14/19  0555 01/13/19  0948   CRP 17.4* 21.9* 29.6*   FERR 126.0 153.6 185.3   DDIME 0.80* 0.67* 0.70*  SUBJECTIVE :     Interval notes reviewed.  Patient is weaned down to 10 L high flow saturation 95%  Denies fever, chills sweats  No headaches or dizziness  Appetite fair  Has been laying on his stomach, no shortness of breath  Denies chest pain  Bowels have been normal  No dysuria  All other systems negative    OBJECTIVE     Visit Vitals  BP 116/66 (BP 1 Location: Left arm, BP Patient Position: Supine)   Pulse 61   Temp 98.3 ??F (36.8 ??C)   Resp 20   Wt 61.2 kg (135 lb)   SpO2 95%    BMI 20.53 kg/m??       Temp (24hrs), Avg:98 ??F (36.7 ??C), Min:97.3 ??F (36.3 ??C), Max:98.3 ??F (36.8 ??C)      General:??No apparent distress, patient in prone position  HEENT: Normocephalic,??mucous membranes pink and moist anicteric. ??  Neck: Supple, no lymphadenopathy, masses or thyromegaly  Chest: Symmetrical expansion  Lungs:??  Breath sounds auscultated all lung fields he is clear bilaterally.  He is now on 10 L of high flow from 15.  Oxygen saturation was 99% patient is in position  Heart:??S1-S2 were distinct and regular grade 2/6 systolic murmur  Abdomen: Soft,??tender with active bowel sounds  Musculoskeletal: Normal strength/tone. No edema. No clubbing or cyanosis  CNS:??Awake and alert, oriented x3. ??Smile symmetric tongue midline  SKIN: No??rash no jaundice      MEDICATIONS:     Current Facility-Administered Medications   Medication Dose Route Frequency   ??? dexAMETHasone (DECADRON) tablet 6 mg  6 mg Oral DAILY WITH DINNER   ??? ketotifen (ZADITOR) 0.025 % (0.035 %) ophthalmic solution 1 Drop  1 Drop Both Eyes BID   ??? promethazine (PHENERGAN) 25 mg in NS IVPB  25 mg IntraVENous Q6H PRN   ??? allopurinoL (ZYLOPRIM) tablet 300 mg  300 mg Oral DAILY   ??? gabapentin (NEURONTIN) capsule 600 mg  600 mg Oral BID   ??? Saccharomyces boulardii (FLORASTOR) capsule 250 mg  250 mg Oral ACB&D   ??? lipase-protease-amylase (CREON 24,000) capsule 1 Cap  1 Cap Oral TID WITH MEALS   ??? metoprolol succinate (TOPROL-XL) XL tablet 25 mg  25 mg Oral DAILY   ??? polyvinyl alcohol-povidon(PF) (REFRESH CLASSIC) 1.4-0.6 % ophthalmic solution 2 Drop  2 Drop Both Eyes DAILY   ??? tamsulosin (FLOMAX) capsule 0.8 mg  0.8 mg Oral DAILY   ??? temazepam (RESTORIL) capsule 7.5 mg  7.5 mg Oral QHS PRN   ??? naloxone (NARCAN) injection 0.1 mg  0.1 mg IntraVENous PRN   ??? ondansetron (ZOFRAN) injection 4 mg  4 mg IntraVENous Q4H PRN   ??? enoxaparin (LOVENOX) injection 40 mg  40 mg SubCUTAneous Q24H   ??? amLODIPine (NORVASC) tablet 10 mg  10 mg Oral DAILY    ??? hydrALAZINE (APRESOLINE) 20 mg/mL injection 10 mg  10 mg IntraVENous Q6H PRN   ??? 0.9% sodium chloride infusion 250 mL  250 mL IntraVENous PRN   ??? ketorolac (TORADOL) injection 15 mg  15 mg IntraVENous Q8H PRN       Labs: Results:   Chemistry Recent Labs     01/15/19  0524 01/14/19  0555 01/13/19  0639   GLU 142* 144* 152*   NA 142 143 144   K 4.1 4.3 4.4   CL 110* 112* 114*   CO2 26 27 24    BUN 30* 26* 25   CREA 1.2 1.2 1.4*   CA  8.4* 8.6 8.1*   AGAP 6 4* 7   AP  --  104 101   TP  --  7.0 6.9   ALB  --  2.6* 2.6*      CBC w/Diff Recent Labs     01/15/19  0524 01/14/19  0555 01/13/19  0639   WBC 6.9 7.1 6.8   RBC 4.03 4.24 4.19   HGB 9.3* 9.7* 9.5*   HCT 29.5* 31.7* 31.6*   PLT 191 172 169   GRANS 85.7* 83.7* 86.0*   LYMPH 8.5* 9.8* 7.7*   EOS 0.1 0.0 0.0          RADIOLOGY :        Imaging  Results from Hospital Encounter encounter on 01/10/19   XR CHEST SNGL V    Narrative INDICATION: hypoxia, multifocal pneumonia, covid-19 positive. Dyspnea  COMPARISON: 01/10/2019  TECHNIQUE: Single view chest.      Impression IMPRESSION:  The patient is status post sternotomy. Heart is mildly enlarged. Mild vascular  congestion. There are bilateral infiltrates in the mid to lower lung zones,  increased since prior radiograph.         Results from [facility] encounter on 06/22/18   CT ABD PELV WO CONT    Narrative CT Abdomen and Pelvis without    Indication: Left hydronephrosis. Evaluate for obstructing stone or lesion.    Comparison: Ultrasound 06/22/2018. CT 05/21/2018.    TECHNIQUE:   CT of the abdomen and pelvis WITHOUT intravenous contrast. Coronal and sagittal  reformations were obtained.        All CT exams at this facility use one or more dose reduction techniques  including automatic exposure control, mA/kV adjustment per patient's size, or  iterative reconstruction technique. DICOM format imaged data is available to  non-affiliated external healthcare facilities or entities on a secure, media   free, reciprocal searchable basis with patient authorization for 12 months  following the date of the study.    DISCUSSION:    ABSENCE OF INTRAVENOUS CONTRAST DECREASES SENSITIVITY FOR DETECTION OF FOCAL  LESIONS AND VASCULAR PATHOLOGY.    LOWER THORAX: Coronary artery calcifications.    HEPATOBILIARY: Multiple subcentimeter foci of hypoattenuation, too small to  characterize but unchanged. Cholecystectomy. Left intrahepatic pneumobilia,  unchanged.  SPLEEN: No splenomegaly.  PANCREAS: Postoperative changes from prior Whipple procedure. No mass or duct  dilatation in the residual pancreatic tail.    ADRENALS: No adrenal nodules.  KIDNEYS/URETERS: Multiple bilateral renal cysts. Multiple small (2 to 4 mm)  nonobstructing bilateral renal calculi. There is a 10 x 9 mm nonobstructing  calculus in the lower pole of the left kidney. Moderate left hydronephrosis and  hydroureter. There is a 8 x 7 mm obstructing calculus in the proximal left  ureter. No right hydronephrosis. No suspicious renal lesions.  PELVIC ORGANS/BLADDER: Enlarged prostate. Penile implant. The bladder is  unremarkable.    PERITONEUM / RETROPERITONEUM: No free air or fluid.  LYMPH NODES: No lymphadenopathy.  VESSELS: Extensive atherosclerotic vascular calcifications. Infrarenal abdominal  aorta ectasia.    GI TRACT: No distention or wall thickening. Diverticulosis. Normal appendix.    BONES AND SOFT TISSUES: Posterior lumbar fixation at L4-5. Mild retrolisthesis  at L2-3 and L3-4. Moderate to advanced disc height loss at L2-3. Mild  spondylosis of the remaining lumbar spine.      Impression IMPRESSION:    1.  Obstructing 8 mm calculus in the left proximal ureter causing moderate left  hydronephrosis.  2.  Multiple  additional nonobstructing bilateral renal calculi.  3.  Multiple bilateral renal cysts.  4.  Diverticulosis.  5.  Postoperative changes from prior Whipple procedure.  6.  Multiple hypoattenuating liver lesions, unchanged.        I have independently examined the patient and reviewed lab studies and imgaing as well as review of nursing notes and physican notes from the past 24 hours.    Dragon medical dictation software was used for portions of this report. Unintended errors may occur.     Melissa Montane, NP  January 15, 2019    [facility] Infectious Disease   Office [place]  [phone]

## 2019-01-15 NOTE — Progress Notes (Signed)
[place] [facility]ARE MEDICINE     Name: Ryver Addams MRN: [id number]   DOB: Jun 16, 1945 Hospital: [facility]   Date: 01/15/2019        IMPRESSION:   ?? Acute hypoxemic respiratory failure due to COVID-19 pneumonitis  ?? COVID-19 pneumonitis  ?? Streptococcal pneumoniae pneumonia, positive strep pneumo urine antigen  ?? Urine culture growing Klebsiella  and E-coli sensitive to cephalosporin  ?? Hypertension  ?? BPH  ?? History of pancreatic cancer  ?? History of left hydronephrosis and stent placement       PLAN:   ?? Continue supplemental oxygen through [place] nasal cannula (15 lit/min).  Titrate, and target oxygen saturation greater than 88%.  ?? Continue proning and deep breathing exercise.  ?? Lasix 20 mg IV times one , follow IN/OUT. Follow renal function   ?? Continue remdesivir for 5 days.  Continue dexamethasone 6 mg IV daily for total 10 days.  S/p convalescent plasma.  ?? Continue azithromycin and ceftriaxone. Follow ID recommendation   ?? Monitor inflammatory markers-slow improvement.  Monitor D-dimer level-relatively stable.  ?? Continue subcu Lovenox for DVT prophylaxis     Subjective/Interval History:     No significant change in patient's overall oxygen requirement.    Review of Systems   Constitutional: Negative.    HENT: Negative.    Eyes: Negative.    Respiratory: Positive for shortness of breath.    Cardiovascular: Negative.    Gastrointestinal: Negative.    Genitourinary: Negative.    Musculoskeletal: Negative.    Skin: Negative.    Neurological: Negative.    Endo/Heme/Allergies: Negative.    Psychiatric/Behavioral: Negative.        Objective:   Vital Signs:    Visit Vitals  BP (!) 110/54 (BP 1 Location: Left arm, BP Patient Position: Supine)   Pulse 71   Temp 98 ??F (36.7 ??C)   Resp 20   Wt 61.2 kg (135 lb)   SpO2 90%   BMI 20.53 kg/m??       O2 Device: Hi flow nasal cannula   O2 Flow Rate (L/min): 15 l/min    Temp (24hrs), Avg:98 ??F (36.7 ??C), Min:97.3 ??F (36.3 ??C), Max:98.3 ??F (36.8 ??C)       Intake/Output:   Last shift:      11/20 0701 - 11/20 1900  In: -   Out: 260 [Urine:260]  Last 3 shifts: 11/18 1901 - 11/20 0700  In: [id number] [P.O.:340; I.V.:1050]  Out: 1075 [Urine:1075]    Intake/Output Summary (Last 24 hours) at 01/15/2019 0843  Last data filed at 01/15/2019 0835  Gross per 24 hour   Intake 890 ml   Output 1335 ml   Net -445 ml        Physical Exam  Vitals signs and nursing note reviewed.   HENT:      Mouth/Throat:      Mouth: Mucous membranes are moist.   Eyes:      Pupils: Pupils are equal, round, and reactive to light.   Cardiovascular:      Rate and Rhythm: Regular rhythm.   Pulmonary:      Breath sounds: No wheezing.   Abdominal:      General: Abdomen is flat.      Palpations: Abdomen is soft.      Tenderness: There is no abdominal tenderness.   Musculoskeletal:         General: No swelling.   Skin:     General: Skin is  warm and dry.   Neurological:      General: No focal deficit present.      Mental Status: He is alert.             DATA:  Labs:  Recent Labs     01/15/19  0524 01/14/19  0555 01/13/19  0639   WBC 6.9 7.1 6.8   HGB 9.3* 9.7* 9.5*   HCT 29.5* 31.7* 31.6*   PLT 191 172 169     Recent Labs     01/15/19  0524 01/14/19  0555 01/13/19  0639   NA 142 143 144   K 4.1 4.3 4.4   CL 110* 112* 114*   CO2 26 27 24    GLU 142* 144* 152*   BUN 30* 26* 25   CREA 1.2 1.2 1.4*   CA 8.4* 8.6 8.1*   MG 2.4 2.2 2.2   ALB  --  2.6* 2.6*   ALT  --  108* 71     No results for input(s): PH, PCO2, PO2, HCO3, FIO2 in the last 72 hours.       Results     Procedure Component Value Units Date/Time    CULTURE, RESPIRATORY/SPUTUM/BRONCH [name] [id number]     Order Status:  No result Specimen:  Sputum     CULTURE, URINE [id number]  (Abnormal)  (Susceptibility) Collected:  01/10/19 1305    Order Status:  Completed Specimen:  Urine from Clean catch Updated:  01/12/19 1841     Isolate --        >100,000 CFU/mL   Klebsiella pneumoniae ssp pneumoniae       Isolate --        50,000 CFU/mL  Escherichia coli      Susceptibility      Klebsiella pneumoniae ssp pneumoniae     MIC     Ampicillin ($) Resistant     Ampicillin/Sulbactam Susceptible     Cefazolin ($) Susceptible     Cefepime Susceptible     Ceftazidime ($) Susceptible     Ceftriaxone ($) Susceptible     Ciprofloxacin ($) Susceptible     Gentamicin ($) Susceptible     Imipenem Susceptible     Levofloxacin ($) Susceptible     Nitrofurantoin Intermediate     Piperacillin/Tazobactam Susceptible     Tobramycin ($) Susceptible     Trimethoprim/Sulfa Susceptible                Susceptibility      Escherichia coli     MIC     Ampicillin ($) Intermediate     Ampicillin/Sulbactam Susceptible     Cefazolin ($) Susceptible     Cefepime Susceptible     Ceftazidime ($) Susceptible     Ceftriaxone ($) Susceptible     Ciprofloxacin ($) Resistant     Gentamicin ($) Susceptible     Imipenem Susceptible     Levofloxacin ($) Resistant     Nitrofurantoin Susceptible     Piperacillin/Tazobactam Susceptible     Tobramycin ($) Susceptible     Trimethoprim/Sulfa Susceptible                Susceptibility Comments     Klebsiella pneumoniae ssp pneumoniae    See Results    Escherichia coli    See Results             LEGIONELLA PNEUMOPHILA AG, URINE [id number] Collected:  01/10/19 1305    Order Status:  Completed Specimen:  Urine Updated:  01/10/19 1439     Legionella Ag, urine NEGATIVE        Comment: Presumptive negative for L. pneumophila serogroup 1 antigen in urine, suggesting no recent or  current infection. Infection due to Legionella cannot be ruled out since other serogroups and  species may cause disease, antigen may not be present in urine in early infection and the level  of antigen present in the urine may be below the detection limit of the test.         S.Judie Petit, UR/CSF [id number]  (Abnormal) Collected:  01/10/19 1305     Order Status:  Completed Specimen:  Urine Updated:  01/10/19 1438     Strep pneumo Ag, urine POSITIVE       CULTURE, BLOOD [id number] Collected:  01/10/19 1147    Order Status:  Completed Specimen:  Blood Updated:  01/14/19 0704     Blood Culture Result No Growth At 72 Hours       CULTURE, BLOOD [id number] Collected:  01/10/19 1130    Order Status:  Completed Specimen:  Blood Updated:  01/14/19 0704     Blood Culture Result No Growth At 72 Hours       SARS-COV-2_FLU [id number]  (Abnormal) Collected:  01/10/19 1050    Order Status:  Completed Specimen:  NASOPHARYNGEAL SWAB Updated:  01/10/19 1230     COVID-19 POSITIVE        Comment: Negative results do not preclude SARS-CoV-2 infection and should not be used as the sole basis  for treatment or other patient management decisions. Negative results must be combined with  clinical observations, patient history, and epidemiological information.  Testing with the Xpert Xpress SARS-CoV-2 test is intended for use by trained operators who are  proficient in performing tests using either GeneXpert Dx, GeneXpert Infinity and/or [employer]. The Xpert Xpress SARS-CoV-2 test is only for use under the Food and Drug  Administration's Emergency Use Authorization.  THE  pos sars cov2_  RESULT WAS CALLED TO (AND READ BACK BY) MEGAN, ER _.             THE NOTIFICATION WAS MADE ON _11/15/20   BY _BD         CULTURE, RESPIRATORY/SPUTUM/BRONCH [name] [id number]     Order Status:  No result Specimen:  Sputum         XR Results (most recent):  Results from Hospital Encounter encounter on 01/10/19   XR CHEST SNGL V    Narrative INDICATION: hypoxia, multifocal pneumonia, covid-19 positive. Dyspnea  COMPARISON: 01/10/2019  TECHNIQUE: Single view chest.      Impression IMPRESSION:  The patient is status post sternotomy. Heart is mildly enlarged. Mild vascular  congestion. There are bilateral infiltrates in the mid to lower lung zones,   increased since prior radiograph.             Imaging:  [x] I have personally reviewed the patient???s radiographs  [] Radiographs reviewed with radiologist   [] No change from prior, tubes and lines in adequate position  [] Improved   [] Worsening        Donavan Foil, MD

## 2019-01-15 NOTE — Other (Signed)
Bedside and Verbal shift change report given to Carlyle Basques RN (oncoming nurse) by Sheran Luz (offgoing nurse). Report included the following information SBAR, Kardex and Recent Results.

## 2019-01-15 NOTE — Progress Notes (Signed)
Patient desaturating to 83%, advised to do a prone position and SPO2 improved to 95%.

## 2019-01-15 NOTE — Progress Notes (Signed)
UNABLE TO VISIT DROPLET PATIENT BY TELEPHONE    Patient is not available to be assessed at this time.    Plan:  Chaplains will continue to follow and provide pastoral support on an as needed/requested Basis.    Chip Horticulturist, [facility]  [phone]

## 2019-01-15 NOTE — Other (Signed)
Bedside and Verbal shift change report given to Emily (oncoming nurse) by Lilian (offgoing nurse). Report included the following information SBAR, Kardex, Intake/Output, MAR and Recent Results.

## 2019-01-15 NOTE — Progress Notes (Signed)
Medical Progress Note      NAME: Tommy Brown   DOB:  11-Jan-1946  MRN:             [id number]    Date:  01/15/2019        Assessment:     1. Acute hypoxic respiratory failure  2. COVID-19 infection  3. Multifocal pneumonia  4. Pancytopenia  5. UTI with Klebsiella pneumoniae  6. Hypertension  7. CAD status post CABG  8. Gout  9. Pancreatic cancer  10. Neuropathy    Plan:     ?? Continue current medical management   ?? Continue supplemental O2 and wean off as tolerated   ?? Patient in prone position this morning   ?? Continue dexamethasone daily complete 10 days course of treatment  ?? Patient already received convalescent plasma and completed remdesivir  ?? Continue antibiotics with Levaquin  ?? Pulmonary and ID also following   ?? Discussed with the patient   ?? Discussed with and updated his wife    Subjective:     Shortness of breathing, denies fever    Objective:     Vitals:    Last 24hrs VS reviewed since prior progress note. Most recent are:  Visit Vitals  BP 121/62 (BP 1 Location: Right arm, BP Patient Position: Supine)   Pulse 72   Temp 97.7 ??F (36.5 ??C)   Resp 20   Wt 61.2 kg (135 lb)   SpO2 91%   BMI 20.53 kg/m??     SpO2 Readings from Last 6 Encounters:   01/15/19 91%   07/31/18 98%   07/17/18 99%   06/26/18 97%   01/21/18 96%    O2 Flow Rate (L/min): 12 l/min       Intake/Output Summary (Last 24 hours) at 01/15/2019 1343  Last data filed at 01/15/2019 [id number]  Gross per 24 hour   Intake 650 ml   Output 985 ml   Net -335 ml        Exam:     General:   Not in acute distress  HEENT: PERRLA, Neck Supple,  No JVD  Respiratory:   CTA bilaterally-no wheezes, rales, rhonchi, or crackles  Cardiac:  Regular Rate and Rythmn  - no murmurs, rubs or gallops  Abdominal:  Soft, non-tender, non-distended, positive bowel sounds  Extremities:  No cyanosis, or edema.  Skin: No rash  Neurological:  No focal neurological deficits    Medication:   Current Medications Reviewed     Current Facility-Administered Medications:   ???  levoFLOXacin (LEVAQUIN) tablet 750 mg, 750 mg, Oral, Q24H, Scott, Berlinda Last, NP, 750 mg at 01/15/19 1213  ???  dexAMETHasone (DECADRON) tablet 6 mg, 6 mg, Oral, DAILY WITH DINNER, Chia Rock, Tami Ribas, MD  ???  ketotifen (ZADITOR) 0.025 % (0.035 %) ophthalmic solution 1 Drop, 1 Drop, Both Eyes, BID, Stanton Kidney, MD, 1 Drop at 01/15/19 0840  ???  promethazine (PHENERGAN) 25 mg in NS IVPB, 25 mg, IntraVENous, Q6H PRN, Stanton Kidney, MD, Last Rate: 200 mL/hr at 01/11/19 1955, 25 mg at 01/11/19 1955  ???  allopurinoL (ZYLOPRIM) tablet 300 mg, 300 mg, Oral, DAILY, Teyanna Thielman, Tami Ribas, MD, 300 mg at 01/15/19 0941  ???  gabapentin (NEURONTIN) capsule 600 mg, 600 mg, Oral, BID, Stanton Kidney, MD, 600 mg at 01/15/19 [id number]  ???  Saccharomyces boulardii (FLORASTOR) capsule 250 mg, 250 mg, Oral, ACB&D, Stanton Kidney, MD, 250 mg at 01/15/19 [id number]  ???  lipase-protease-amylase (CREON 24,000)  capsule 1 Cap, 1 Cap, Oral, TID WITH MEALS, Stanton Kidney, MD, 1 Cap at 01/15/19 1213  ???  metoprolol succinate (TOPROL-XL) XL tablet 25 mg, 25 mg, Oral, DAILY, Stanton Kidney, MD, 25 mg at 01/15/19 [id number]  ???  polyvinyl alcohol-povidon(PF) (REFRESH CLASSIC) 1.4-0.6 % ophthalmic solution 2 Drop, 2 Drop, Both Eyes, DAILY, Zosia Lucchese, Tami Ribas, MD, 2 Drop at 01/15/19 0839  ???  tamsulosin (FLOMAX) capsule 0.8 mg, 0.8 mg, Oral, DAILY, Stanton Kidney, MD, 0.8 mg at 01/15/19 [id number]  ???  temazepam (RESTORIL) capsule 7.5 mg, 7.5 mg, Oral, QHS PRN, Stanton Kidney, MD, 7.5 mg at 01/14/19 2203  ???  naloxone (NARCAN) injection 0.1 mg, 0.1 mg, IntraVENous, PRN, Jonaven Hilgers, Tami Ribas, MD  ???  ondansetron (ZOFRAN) injection 4 mg, 4 mg, IntraVENous, Q4H PRN, Delene Loll, Tami Ribas, MD  ???  enoxaparin (LOVENOX) injection 40 mg, 40 mg, SubCUTAneous, Q24H, Newman Waren, Tami Ribas, MD, 40 mg at 01/14/19 1707  ???  amLODIPine (NORVASC) tablet 10 mg, 10 mg, Oral, DAILY, Stanton Kidney, MD, 10 mg at 01/15/19 [id number]   ???  hydrALAZINE (APRESOLINE) 20 mg/mL injection 10 mg, 10 mg, IntraVENous, Q6H PRN, Ramie Palladino, Tami Ribas, MD  ???  0.9% sodium chloride infusion 250 mL, 250 mL, IntraVENous, PRN, Delene Loll, Tami Ribas, MD  ???  ketorolac (TORADOL) injection 15 mg, 15 mg, IntraVENous, Q8H PRN, Stanton Kidney, MD, 15 mg at 01/12/19 [id number]      Lab:     Lab Data Reviewed: (see below)  Recent Results (from the past 24 hour(s))   D DIMER    Collection Time: 01/15/19  5:24 AM   Result Value Ref Range    D DIMER 0.80 (H) 0.01 - 0.50 ug/mL (FEU)   FERRITIN    Collection Time: 01/15/19  5:24 AM   Result Value Ref Range    Ferritin 126.0 26.0 - 388.0 ng/ml   C REACTIVE PROTEIN, QT    Collection Time: 01/15/19  5:24 AM   Result Value Ref Range    C-Reactive protein 17.4 (H) 0.0 - 2.9 mg/L   CBC WITH AUTOMATED DIFF    Collection Time: 01/15/19  5:24 AM   Result Value Ref Range    WBC 6.9 4.0 - 11.0 1000/mm3    RBC 4.03 3.80 - 5.70 M/uL    HGB 9.3 (L) 12.4 - 17.2 gm/dl    HCT 29.5 (L) 37.0 - 50.0 %    MCV 73.2 (L) 80.0 - 98.0 fL    MCH 23.1 23.0 - 34.6 pg    MCHC 31.5 30.0 - 36.0 gm/dl    PLATELET 191 140 - 450 1000/mm3    MPV N/A 6.0 - 10.0 fL    RDW-SD 57.2 (H) 35.1 - 43.9      NEUTROPHILS 85.7 (H) 34 - 64 %    LYMPHOCYTES 8.5 (L) 28 - 48 %    MONOCYTES 4.8 1 - 13 %    EOSINOPHILS 0.1 0 - 5 %    BASOPHILS 0.9 0 - 3 %    RBC Morphology NORMAL     MAGNESIUM    Collection Time: 01/15/19  5:24 AM   Result Value Ref Range    Magnesium 2.4 1.6 - 2.6 mg/dl   METABOLIC PANEL, BASIC    Collection Time: 01/15/19  5:24 AM   Result Value Ref Range    Sodium 142 136 - 145 mEq/L    Potassium 4.1 3.5 - 5.1 mEq/L    Chloride 110 (H)  98 - 107 mEq/L    CO2 26 21 - 32 mEq/L    Glucose 142 (H) 74 - 106 mg/dl    BUN 30 (H) 7 - 25 mg/dl    Creatinine 1.2 0.6 - 1.3 mg/dl    GFR est AA >60      GFR est non-AA >60      Calcium 8.4 (L) 8.5 - 10.1 mg/dl    Anion gap 6 5 - 15 mmol/L       [place] Retroperitoneum Comp    Result Date: 01/14/2019   RENAL AND BLADDER ULTRASOUND INDICATION: UTI with history of ureteral and kidney stones. COMPARISON: 07/15/2018 TECHNIQUE: The examination was performed using grayscale and color Doppler. FINDINGS: Technologist notes that the examination is degraded secondary to patient condition. The right kidney measures 9.9 x 6.5 x 4.3 cm. The left kidney measures 11.2 x 7.4 x 4.1 cm. There is no hydronephrosis or nephrolithiasis. There are bilateral renal cysts measuring up to 1.9 x 1.4 x 1.7 cm within the upper pole of the right kidney and 1.7 x 1.5 x 1.7 cm within the lower pole of the left kidney. The kidneys are echogenic bilaterally. There are bilateral renal cortical calcifications. Bladder volume measures 54 cc. The bladder is suboptimally distended. Incidentally noted is splenomegaly and an echogenic liver.     IMPRESSION: 1.  Suboptimal exam. No hydronephrosis. Bilateral renal calcifications versus nonobstructing calculi. 2.  Bilateral renal cysts. 3.  Splenomegaly. 4.  Hepatic steatosis       Active Problems:    Pneumonia due to COVID-19 virus (01/10/2019)      ____________________________________________________________________      Attending Physician: Stanton Kidney, MD       Dragon medical dictation software was used for portions of this report. Unintended voice recognition errors may occur.

## 2019-01-15 NOTE — Progress Notes (Signed)
Progress Notes by Tommy Foil, MD at 01/15/19 [phone]                Author: Donavan Foil, MD  Service: Pulmonary Disease  Author Type: Physician       Filed: 01/15/19 1017  Date of Service: 01/15/19 0842  Status: Signed          Editor: Tommy Foil, MD (Physician)                       [facility]            Name:  Tommy Brown  MRN:  [id number]          DOB:  1945/08/27  Hospital:  [facility]     Date:  01/15/2019               IMPRESSION:     ??    Acute hypoxemic respiratory failure due to COVID-19 pneumonitis   ??  COVID-19 pneumonitis   ??  Streptococcal pneumoniae pneumonia, positive strep pneumo urine antigen   ??  Urine culture growing Klebsiella  and E-coli sensitive to cephalosporin   ??  Hypertension   ??  BPH   ??  History of pancreatic cancer   ??  History of left hydronephrosis and stent placement               PLAN:     ??    Continue supplemental oxygen through [place] nasal cannula (15 lit/min).  Titrate, and target oxygen saturation greater than  88%.   ??  Continue proning and deep breathing exercise.   ??  Lasix 20 mg IV times one , follow IN/OUT. Follow renal function    ??  Continue remdesivir for 5 days.  Continue dexamethasone 6 mg IV daily for total 10 days.  S/p convalescent plasma.   ??  Continue azithromycin and ceftriaxone. Follow ID recommendation    ??  Monitor inflammatory markers-slow improvement.  Monitor D-dimer level-relatively stable.   ??  Continue subcu Lovenox for DVT prophylaxis          Subjective/Interval History:        No significant change in patient's overall oxygen requirement.      Review of Systems    Constitutional: Negative.     HENT: Negative.     Eyes: Negative.     Respiratory: Positive for shortness of breath.     Cardiovascular: Negative.     Gastrointestinal: Negative.     Genitourinary: Negative.     Musculoskeletal: Negative.     Skin: Negative.     Neurological: Negative.      Endo/Heme/Allergies: Negative.     Psychiatric/Behavioral: Negative.             Objective:     Vital Signs:       Visit Vitals   BP  (!) 110/54 (BP 1 Location: Left arm, BP Patient Position: Supine)      Pulse  71      Temp  98 ??F (36.7 ??C)      Resp  20      Wt  61.2 kg (135 lb)      SpO2  90%      BMI  20.53 kg/m??                O2 Device: Hi flow nasal cannula     O2 Flow Rate (L/min): 15 l/min  Temp (24hrs), Avg:98 ??F (36.7 ??C), Min:97.3 ??F (36.3 ??C), Max:98.3 ??F (36.8 ??C)           Intake/Output:    Last shift:      11/20 0701 - 11/20 1900   In: -    Out: 260 [Urine:260]   Last 3 shifts: 11/18 1901 - 11/20 0700   In: [id number] [P.O.:340; I.V.:1050]   Out: 1075 [Urine:1075]      Intake/Output Summary (Last 24 hours) at 01/15/2019 0843   Last data filed at 01/15/2019 0835     Gross per 24 hour        Intake  890 ml        Output  1335 ml        Net  -445 ml            Physical Exam   Vitals signs and nursing note reviewed.   HENT :       Mouth/Throat:      Mouth: Mucous membranes are moist.   Eyes :       Pupils: Pupils are equal, round, and reactive to light.   Cardiovascular:       Rate and Rhythm: Regular rhythm.   Pulmonary :       Breath sounds: No wheezing.   Abdominal:      General: Abdomen is flat.      Palpations: Abdomen is soft.      Tenderness: There is no  abdominal tenderness.     Musculoskeletal:          General: No swelling.    Skin:      General: Skin is warm and dry.   Neurological :       General: No focal deficit present.      Mental Status: He is alert.                 DATA:   Labs:     Recent Labs             01/15/19   0524  01/14/19   0555  01/13/19   0639     WBC  6.9  7.1  6.8     HGB  9.3*  9.7*  9.5*     HCT  29.5*  31.7*  31.6*          PLT  191  172  169          Recent Labs             01/15/19   0524  01/14/19   0555  01/13/19   0639     NA  142  143  144     K  4.1  4.3  4.4     CL  110*  112*  114*     CO2  26  27  24      GLU  142*  144*  152*     BUN  30*  26*  25     CREA  1.2   1.2  1.4*     CA  8.4*  8.6  8.1*     MG  2.4  2.2  2.2     ALB   --   2.6*  2.6*          ALT   --   108*  71        No results for input(s): PH, PCO2, PO2, HCO3, FIO2 in the last  72 hours.            Results               Procedure  Component  Value  Units  Date/Time           CULTURE, RESPIRATORY/SPUTUM/BRONCH [name] [id number]              Order Status:  No result  Specimen:  Sputum             CULTURE, URINE [id number]  (Abnormal)  (Susceptibility)  Collected:  01/10/19 1305            Order Status:  Completed  Specimen:  Urine from Clean catch  Updated:  01/12/19 1841                Isolate  --                    >100,000 CFU/mL   Klebsiella pneumoniae ssp pneumoniae                     Isolate  --                    50,000 CFU/mL   Escherichia coli               Susceptibility           Klebsiella pneumoniae ssp pneumoniae          MIC          Ampicillin ($)  Resistant          Ampicillin/Sulbactam  Susceptible          Cefazolin ($)  Susceptible          Cefepime  Susceptible          Ceftazidime ($)  Susceptible          Ceftriaxone ($)  Susceptible          Ciprofloxacin ($)  Susceptible          Gentamicin ($)  Susceptible          Imipenem  Susceptible          Levofloxacin ($)  Susceptible          Nitrofurantoin  Intermediate          Piperacillin/Tazobactam  Susceptible          Tobramycin ($)  Susceptible          Trimethoprim/Sulfa  Susceptible                                Susceptibility           Escherichia coli          MIC          Ampicillin ($)  Intermediate          Ampicillin/Sulbactam  Susceptible          Cefazolin ($)  Susceptible          Cefepime  Susceptible          Ceftazidime ($)  Susceptible          Ceftriaxone ($)  Susceptible          Ciprofloxacin ($)  Resistant          Gentamicin ($)  Susceptible          Imipenem  Susceptible          Levofloxacin ($)  Resistant          Nitrofurantoin  Susceptible          Piperacillin/Tazobactam  Susceptible          Tobramycin  ($)  Susceptible          Trimethoprim/Sulfa  Susceptible                                Susceptibility Comments         Klebsiella pneumoniae ssp pneumoniae        See Results        Escherichia coli        See Results                                LEGIONELLA PNEUMOPHILA AG, URINE [id number]  Collected:  01/10/19 1305            Order Status:  Completed  Specimen:  Urine  Updated:  01/10/19 1439                Legionella Ag, urine  NEGATIVE                  Comment:  Presumptive negative for L. pneumophila serogroup 1 antigen in urine, suggesting no recent or   current infection. Infection due to Legionella cannot be ruled out since other serogroups and   species may cause disease, antigen may not be present in urine in early infection and the level   of antigen present in the urine may be below the detection limit of the test.                        S.PNEUMO AG, UR/CSF [id number]  (Abnormal)  Collected:  01/10/19 1305            Order Status:  Completed  Specimen:  Urine  Updated:  01/10/19 1438                Strep pneumo Ag, urine  POSITIVE                  CULTURE, BLOOD [id number]  Collected:  01/10/19 1147            Order Status:  Completed  Specimen:  Blood  Updated:  01/14/19 0704                Blood Culture Result  No Growth At 72 Hours                CULTURE, BLOOD [id number]  Collected:  01/10/19 1130            Order Status:  Completed  Specimen:  Blood  Updated:  01/14/19 0704                Blood Culture Result  No Growth At 72 Hours                SARS-COV-2_FLU [id number]  (Abnormal)  Collected:  01/10/19 1050            Order Status:  Completed  Specimen:  NASOPHARYNGEAL SWAB  Updated:  01/10/19 1230                COVID-19  POSITIVE  Comment:  Negative results do not preclude SARS-CoV-2 infection and should not be used as the sole basis   for treatment or other patient management decisions. Negative results must be combined with   clinical observations, patient history,  and epidemiological information.   Testing with the Xpert Xpress SARS-CoV-2 test is intended for use by trained operators who are   proficient in performing tests using either GeneXpert Dx, GeneXpert Infinity and/or [employer]. The Xpert Xpress SARS-CoV-2 test is only for use under the Food and Drug   Administration's Emergency Use Authorization.   THE  pos sars cov2_  RESULT WAS CALLED TO (AND READ BACK BY) MEGAN, ER _.              THE NOTIFICATION WAS MADE ON _11/15/20   BY _BD                        CULTURE, RESPIRATORY/SPUTUM/BRONCH [name] [id number]              Order Status:  No result  Specimen:  Sputum               XR Results (most recent):     Results from Hospital Encounter encounter on 01/10/19     XR CHEST SNGL V           Narrative  INDICATION: hypoxia, multifocal pneumonia, covid-19 positive. Dyspnea   COMPARISON: 01/10/2019   TECHNIQUE: Single view chest.              Impression  IMPRESSION:   The patient is status post sternotomy. Heart is mildly enlarged. Mild vascular   congestion. There are bilateral infiltrates in the mid to lower lung zones,   increased since prior radiograph.                    Imaging:   [x]  I have personally reviewed the patients radiographs   []  Radiographs reviewed with radiologist    []  No change from prior, tubes and lines in adequate position   [] Improved   [] Worsening            Tommy Foil, MD

## 2019-01-15 NOTE — Progress Notes (Signed)
NUTRITION RECOMMENDATIONS:   . Rec continue regular diet.  . Rec Ensure Enlive TID (350 kcals, 20 g protein each)  . Wts 3x/wk to trend.    NUTRITION INITIAL EVALUATION    NUTRITION ASSESSMENT:       Reason for Assessment: LOS    Admitting Diagnosis: Pneumonia due to COVID-19 virus [U07.1, J12.89]     PMH:   Past Medical History:   Diagnosis Date   . Arthritis    . Benign prostate hyperplasia    . Bladder infection    . Chronic kidney disease    . Coronary arteriosclerosis    . Coronary atherosclerosis of artery bypass graft    . Decreased testosterone level    . Diverticular disease    . Essential hypertension    . Gout    . History of acute renal failure    . History of anemia    . History of malignant neoplasm of pancreas    . Hx of CABG    . Kidney stone    . Kidney stones    . Neuropathy    . Pancreatic cancer (HCC)    . Sepsis (HCC)    . Tremor      Pertinent Medications: decadron, lovenox, gabapentin, creon, pobiotic    Pertinent Labs: 11/20 H/H 9.3/29.5 L, MCV 73.2 L, Glu 142 H, BUN 30 H, Ca 8.4 L, CRP 17.4 H (COVID)    Anthropometrics:  Height:   Ht Readings from Last 3 Encounters:   12/07/18 5\' 8"  (1.727 m)   08/06/18 5\' 8"  (1.727 m)   07/31/18 5\' 8"  (1.727 m)      Weight:   Wt Readings from Last 3 Encounters:   01/10/19 61.2 kg (135 lb)   12/07/18 61.2 kg (135 lb)   08/06/18 63.5 kg (140 lb)       BMI: Body mass index is 20.53 kg/m.    IBW: Patient height not recorded           UBW: 135-140 lbs per pt report    Wt Change: Pt denies wt changes  []  significant   []  not significant [x]  stable   []  intended  []  not intended      GI Symptoms/Issues:  Pt denies N/V, abd pain, constipation/diarrhea.  Last Bowel Movement Date: 01/13/19  Stool Appearance: Loose  Abdominal Assessment: Soft  Chewing/Swallowing Issues:  None identified   Skin Integrity:  Intact   Fluid Accumulation:  None     Physical Assessment:   Muscle/Fat Wasting:  . Pt not seen at this time in attempt to reduce unnecessary exposure, pt COVID+       Diet and Intake History:   Current Diet Order: DIET REGULAR    Food Allergies: NKFA    Diet/Intake History: Pt reports poor appetite/PO intake, consuming <50% of usual intake x 1 week PTA. Pt reports appetite increasing in house, but only able to consume ~50% of meals.   [x]  <50% intake x >5 days    Current Appetite/PO Intake: % Diet Eaten  Avg: 12.7 %  Min: 0 %  Max: 50 %.    Assessment of Current MNT: Diet is adequate if intake averages 75% at most meals - recommend adding oral supplement TID to increase protein-calorie intake opportunity.     Estimated Daily Nutrition Needs: 61.2 kg   1836 - 2142 kcals (30-35 kcal/kg)   73 - 92 g protein (1.2-1.5 g/kg)   1224 - 1530 mL fluid (20-25 ml/kg or per  MD)    NUTRITION DIAGNOSIS:     1. Inadequate energy intake related to COVID-19 as evidenced by pt consuming less than 50% of estimated needs X1 week PTA.    2. Increased energy expenditure related to metabolic demand for catabolic state as evidenced by COVID-19.    NUTRITION INTERVENTION / RECOMMENDATIONS:     . Rec continue regular diet.  . Rec Ensure Enlive TID (350 kcals, 20 g protein each)  . Wts 3x/wk to trend.    NUTRITION MONITORING AND EVALUATION:     Nutrition Level of Care:   []  Low       [x]  Moderate      []  High    Monitoring and Evaluation: Wt trend, PO intake, nutrition-related labs, and s/sx of new skin concerns; will f/u per policy.    Nutrition Goals:  Pt to meet/tolerate >75% of estimated needs, maintain weight throughout LOS, BG control <180, maintain skin integrity.    Discharge Recommendations: Regular + ONS    Gwinda Maine, MS, RDN  01/15/19   Pager: [phone]  Office: [phone]

## 2019-01-15 NOTE — Progress Notes (Signed)
Problem: Gas Exchange - Impaired  Goal: *Absence of hypoxia  Outcome: Progressing Towards Goal     Problem: Patient Education: Go to Patient Education Activity  Goal: Patient/Family Education  Outcome: Progressing Towards Goal

## 2019-01-15 NOTE — Progress Notes (Signed)
UNABLE TO VISIT DROPLET PATIENT BY TELEPHONE    Patient is not available to be assessed at this time.    Plan:  Chaplains will continue to follow and provide pastoral support on an as needed/requested Basis.    [employer] operational officer Care  [phone]

## 2019-01-15 NOTE — Progress Notes (Signed)
Progress  Notes by [name], MD at 01/15/19 [phone]                Author: [name], MD  Service: Infectious Disease  Author Type: Physician       Filed: 01/15/19 1100  Date of Service: 01/15/19 0823  Status: Addendum          Editor: [name], MD (Physician)          Related Notes: Original Note by Melissa Montane, NP (Nurse Practitioner) filed at 01/15/19  1047                                                                                                                                                            INFECTIOUS DISEASE FOLLOW UP NOTE            Infectious Disease Attending      I have reviewed Tommy Brown's  chart. I saw and examined the patient, personally performed the key portions of the service and discussed  the management with the treatment team. I reviewed the NP's note and concur with the history, findings and plan of care and my edits as needed. Laboratory, hemodynamic and radiological data were independently reviewed.       -Patient afebrile, still requiring high flow   -BC normal and subjectively feeling better   -Creatinine normal and tolerating remdesivir-completed 5-day course   -Continue steroids for 10 days   -Given positive strep pneumo antigen, will switch to Levaquin and plan 5 more days   -Monitor labs and inflammatory markers   -Monitor for any worsening      We will see patient again on Monday. Please call if any urgent question or concern. I can be reached at  [phone].         [name], MD                   ABX:     ??      Current abx  Prior abx??     Levaquin 11/20-0   Decadron 11/15-5   Remdesivir 11/15-5-->complete   Convalescent plasma 11/15  ??Zithromax 11/15-5   Rocephin 11/15-5     ??     ASSESSMENT:??     ??     Pneumonia--Strep Pneumonia Urine ag +     Viral pneumonia--COVID-19 positive     UTI   UA unrevealing   Urine culture positive clubs and E. coli     History of left hydronephrosis due to renal calculi   06/22/18??JJ stent placement    07/31/2018 cystoscopy left retrograde pyelogram left ureteroscopy laser lithotripsy proximal ureteral stone and extraction of left renal calculi and exchange of double-J stents   08/06/2018 during stent removed     CAD s/p??CABG  Pancreatic cancer   2006 for procedure, radiation, chemo     Gout     Hypogonadism     ??     ??   ??   ??     RECOMMENDATIONS:     [name] is a 73 year old male with past medical history of hypertension, CAD, prostate cancer, hydronephrosis requiring stent was admitted for shortness  of breath, positive COVID-19 and multifocal pneumonia   ??   -Afebrile, WBC normal   -Patient with multifocal pneumonia on chest x-ray   -Positive for COVID-19   -Patient also with positive strep pneumo antigen   -Was treated with 5 days of ceftriaxone, will start Levaquin and anticipate treating 5 more days to complete 10-day course for strep pneumonia ammonia   -Check sputum culture-->await collection   -Completed 5-day course IV Rocephin and Zithromax   -Pt s/p convalescent plasma-   -Continue on broad-spectrum antibiotics with ceftriaxone and azithromycin and plan short course   -Watch for any worsening   -Inflammatory markers slowly trending down   -D-dimer 0.8   ??   -Patient's urine culture with Klebsiella and E. Coli--> treated with short course of ceftriaxone      -Monitor labs and cultures   -Plan discussed with [name]        COVID-19 Lab test monitoring:     Blood type: A Rh Negative   Pregnancy:   Quantiferon TB gold:         Labs:   No results for input(s): PTEMPI, O2ST, FIO2 in the last 72 hours.     Recent Labs             01/15/19   0524  01/14/19   0555  01/13/19   0639     WBC  6.9  7.1  6.8     CREA  1.2  1.2  1.4*     PLT  191  172  169     AST   --   139*  122*          ALT   --   108*  71          Recent Labs             01/15/19   0524  01/14/19   0555  01/13/19   0948     CRP  17.4*  21.9*  29.6*     FERR  126.0  153.6  185.3          DDIME  0.80*  0.67*  0.70*                       SUBJECTIVE :        Interval notes reviewed.  Patient is weaned down to 10 L high flow saturation 95%   Denies fever, chills sweats   No headaches or dizziness   Appetite fair   Has been laying on his stomach, no shortness of breath   Denies chest pain   Bowels have been normal   No dysuria   All other systems negative        OBJECTIVE        Visit Vitals      BP  116/66 (BP 1 Location: Left arm, BP Patient Position: Supine)     Pulse  61     Temp  98.3 ??F (36.8 ??C)     Resp  20     Wt  61.2  kg (135 lb)     SpO2  95%        BMI  20.53 kg/m??           Temp (24hrs), Avg:98 ??F (36.7 ??C), Min:97.3 ??F (36.3 ??C), Max:98.3 ??F (36.8 ??C)         General:??No apparent distress, patient in prone position   HEENT: Normocephalic,??mucous membranes pink and moist anicteric. ??   Neck: Supple, no lymphadenopathy, masses or thyromegaly   Chest: Symmetrical expansion   Lungs:??  Breath sounds auscultated all lung fields he is clear bilaterally.  He is now on 10 L of high flow from 15.  Oxygen saturation was 99% patient is in position   Heart:??S1-S2 were distinct and regular grade 2/6 systolic murmur   Abdomen: Soft,??tender with active bowel sounds   Musculoskeletal: Normal strength/tone. No edema. No clubbing or cyanosis   CNS:??Awake and alert, oriented x3. ??Smile symmetric tongue midline   SKIN: No??rash no jaundice           MEDICATIONS:          Current Facility-Administered Medications          Medication  Dose  Route  Frequency           ?  dexAMETHasone (DECADRON) tablet 6 mg   6 mg  Oral  DAILY WITH DINNER     ?  ketotifen (ZADITOR) 0.025 % (0.035 %) ophthalmic solution 1 Drop   1 Drop  Both Eyes  BID     ?  promethazine (PHENERGAN) 25 mg in NS IVPB   25 mg  IntraVENous  Q6H PRN     ?  allopurinoL (ZYLOPRIM) tablet 300 mg   300 mg  Oral  DAILY     ?  gabapentin (NEURONTIN) capsule 600 mg   600 mg  Oral  BID     ?  Saccharomyces boulardii (FLORASTOR) capsule 250 mg   250 mg  Oral  ACB&D     ?  lipase-protease-amylase (CREON 24,000)  capsule 1 Cap   1 Cap  Oral  TID WITH MEALS     ?  metoprolol succinate (TOPROL-XL) XL tablet 25 mg   25 mg  Oral  DAILY     ?  polyvinyl alcohol-povidon(PF) (REFRESH CLASSIC) 1.4-0.6 % ophthalmic solution 2 Drop   2 Drop  Both Eyes  DAILY     ?  tamsulosin (FLOMAX) capsule 0.8 mg   0.8 mg  Oral  DAILY     ?  temazepam (RESTORIL) capsule 7.5 mg   7.5 mg  Oral  QHS PRN     ?  naloxone (NARCAN) injection 0.1 mg   0.1 mg  IntraVENous  PRN     ?  ondansetron (ZOFRAN) injection 4 mg   4 mg  IntraVENous  Q4H PRN           ?  enoxaparin (LOVENOX) injection 40 mg   40 mg  SubCUTAneous  Q24H           ?  amLODIPine (NORVASC) tablet 10 mg   10 mg  Oral  DAILY     ?  hydrALAZINE (APRESOLINE) 20 mg/mL injection 10 mg   10 mg  IntraVENous  Q6H PRN     ?  0.9% sodium chloride infusion 250 mL   250 mL  IntraVENous  PRN           ?  ketorolac (TORADOL) injection 15 mg   15 mg  IntraVENous  Q8H PRN              Labs:  Results:        Chemistry  Recent Labs         01/15/19   0524  01/14/19   0555  01/13/19   0639      GLU  142*  144*  152*      NA  142  143  144      K  4.1  4.3  4.4      CL  110*  112*  114*      CO2  26  27  24       BUN  30*  26*  25      CREA  1.2  1.2  1.4*      CA  8.4*  8.6  8.1*      AGAP  6  4*  7      AP   --   104  101      TP   --   7.0  6.9      ALB   --   2.6*  2.6*                 CBC w/Diff  Recent Labs         01/15/19   0524  01/14/19   0555  01/13/19   0639      WBC  6.9  7.1  6.8      RBC  4.03  4.24  4.19      HGB  9.3*  9.7*  9.5*      HCT  29.5*  31.7*  31.6*      PLT  191  172  169      GRANS  85.7*  83.7*  86.0*      LYMPH  8.5*  9.8*  7.7*      EOS  0.1  0.0  0.0                      RADIOLOGY :            Imaging     Results from Hospital Encounter encounter on 01/10/19     XR CHEST SNGL V           Narrative  INDICATION: hypoxia, multifocal pneumonia, covid-19 positive. Dyspnea   COMPARISON: 01/10/2019   TECHNIQUE: Single view chest.              Impression  IMPRESSION:   The patient is  status post sternotomy. Heart is mildly enlarged. Mild vascular   congestion. There are bilateral infiltrates in the mid to lower lung zones,   increased since prior radiograph.                Results from [facility] encounter on 06/22/18     CT ABD PELV WO CONT           Narrative  CT Abdomen and Pelvis without      Indication: Left hydronephrosis. Evaluate for obstructing stone or lesion.      Comparison: Ultrasound 06/22/2018. CT 05/21/2018.      TECHNIQUE:    CT of the abdomen and pelvis WITHOUT intravenous contrast. Coronal and sagittal   reformations were obtained.          All CT exams at this facility use one or more dose reduction techniques   including automatic exposure control,  mA/kV adjustment per patient's size, or   iterative reconstruction technique. DICOM format imaged data is available to   non-affiliated external healthcare facilities or entities on a secure, media   free, reciprocal searchable basis with patient authorization for 12 months   following the date of the study.      DISCUSSION:      ABSENCE OF INTRAVENOUS CONTRAST DECREASES SENSITIVITY FOR DETECTION OF FOCAL   LESIONS AND VASCULAR PATHOLOGY.      LOWER THORAX: Coronary artery calcifications.      HEPATOBILIARY: Multiple subcentimeter foci of hypoattenuation, too small to   characterize but unchanged. Cholecystectomy. Left intrahepatic pneumobilia,   unchanged.   SPLEEN: No splenomegaly.   PANCREAS: Postoperative changes from prior Whipple procedure. No mass or duct   dilatation in the residual pancreatic tail.      ADRENALS: No adrenal nodules.   KIDNEYS/URETERS: Multiple bilateral renal cysts. Multiple small (2 to 4 mm)   nonobstructing bilateral renal calculi. There is a 10 x 9 mm nonobstructing   calculus in the lower pole of the left kidney. Moderate left hydronephrosis and   hydroureter. There is a 8 x 7 mm obstructing calculus in the proximal left   ureter. No right hydronephrosis. No suspicious renal lesions.   PELVIC  ORGANS/BLADDER: Enlarged prostate. Penile implant. The bladder is   unremarkable.      PERITONEUM / RETROPERITONEUM: No free air or fluid.   LYMPH NODES: No lymphadenopathy.   VESSELS: Extensive atherosclerotic vascular calcifications. Infrarenal abdominal   aorta ectasia.      GI TRACT: No distention or wall thickening. Diverticulosis. Normal appendix.      BONES AND SOFT TISSUES: Posterior lumbar fixation at L4-5. Mild retrolisthesis   at L2-3 and L3-4. Moderate to advanced disc height loss at L2-3. Mild   spondylosis of the remaining lumbar spine.              Impression  IMPRESSION:      1.  Obstructing 8 mm calculus in the left proximal ureter causing moderate left   hydronephrosis.   2.  Multiple additional nonobstructing bilateral renal calculi.   3.  Multiple bilateral renal cysts.   4.  Diverticulosis.   5.  Postoperative changes from prior Whipple procedure.   6.  Multiple hypoattenuating liver lesions, unchanged.           I have independently examined the patient and reviewed lab studies and imgaing as well as review of nursing notes and physican notes from the past 24 hours.      Dragon medical dictation software was used for portions of this report. Unintended errors may occur.       Melissa Montane, NP   January 15, 2019      [facility]    Office [place]   [phone]

## 2019-01-15 NOTE — Progress Notes (Signed)
Patient desaturating to 83%, advised to do a prone position and SPO2 improved to 95%.

## 2019-01-15 NOTE — Progress Notes (Signed)
Medical Progress Note      NAME: Tommy Brown   DOB:  August 10, 1945  MRN:             [id number]    Date:  01/15/2019        Assessment:     1. Acute hypoxic respiratory failure  2. COVID-19 infection  3. Multifocal pneumonia  4. Pancytopenia  5. UTI with Klebsiella pneumoniae  6. Hypertension  7. CAD status post CABG  8. Gout  9. Pancreatic cancer  10. Neuropathy    Plan:     ?? Continue current medical management   ?? Continue supplemental O2 and wean off as tolerated   ?? Patient in prone position this morning   ?? Continue dexamethasone daily complete 10 days course of treatment  ?? Patient already received convalescent plasma and completed remdesivir  ?? Continue antibiotics with Levaquin  ?? Pulmonary and ID also following   ?? Discussed with the patient   ?? Discussed with and updated his wife    Subjective:     Shortness of breathing, denies fever    Objective:     Vitals:    Last 24hrs VS reviewed since prior progress note. Most recent are:  Visit Vitals  BP 121/62 (BP 1 Location: Right arm, BP Patient Position: Supine)   Pulse 72   Temp 97.7 ??F (36.5 ??C)   Resp 20   Wt 61.2 kg (135 lb)   SpO2 91%   BMI 20.53 kg/m??     SpO2 Readings from Last 6 Encounters:   01/15/19 91%   07/31/18 98%   07/17/18 99%   06/26/18 97%   01/21/18 96%    O2 Flow Rate (L/min): 12 l/min       Intake/Output Summary (Last 24 hours) at 01/15/2019 1343  Last data filed at 01/15/2019 [id number]  Gross per 24 hour   Intake 650 ml   Output 985 ml   Net -335 ml        Exam:     General:   Not in acute distress  HEENT: PERRLA, Neck Supple,  No JVD  Respiratory:   CTA bilaterally-no wheezes, rales, rhonchi, or crackles  Cardiac:  Regular Rate and Rythmn  - no murmurs, rubs or gallops  Abdominal:  Soft, non-tender, non-distended, positive bowel sounds  Extremities:  No cyanosis, or edema.  Skin: No rash  Neurological:  No focal neurological deficits    Medication:   Current Medications Reviewed    Current Facility-Administered  Medications:   ???  levoFLOXacin (LEVAQUIN) tablet 750 mg, 750 mg, Oral, Q24H, Scott, Berlinda Last, NP, 750 mg at 01/15/19 1213  ???  dexAMETHasone (DECADRON) tablet 6 mg, 6 mg, Oral, DAILY WITH DINNER, Tyriq Moragne, Tami Ribas, MD  ???  ketotifen (ZADITOR) 0.025 % (0.035 %) ophthalmic solution 1 Drop, 1 Drop, Both Eyes, BID, Stanton Kidney, MD, 1 Drop at 01/15/19 0840  ???  promethazine (PHENERGAN) 25 mg in NS IVPB, 25 mg, IntraVENous, Q6H PRN, Stanton Kidney, MD, Last Rate: 200 mL/hr at 01/11/19 1955, 25 mg at 01/11/19 1955  ???  allopurinoL (ZYLOPRIM) tablet 300 mg, 300 mg, Oral, DAILY, Mella Inclan, Tami Ribas, MD, 300 mg at 01/15/19 0941  ???  gabapentin (NEURONTIN) capsule 600 mg, 600 mg, Oral, BID, Stanton Kidney, MD, 600 mg at 01/15/19 [id number]  ???  Saccharomyces boulardii (FLORASTOR) capsule 250 mg, 250 mg, Oral, ACB&D, Stanton Kidney, MD, 250 mg at 01/15/19 [id number]  ???  lipase-protease-amylase (CREON 24,000)  capsule 1 Cap, 1 Cap, Oral, TID WITH MEALS, Stanton Kidney, MD, 1 Cap at 01/15/19 1213  ???  metoprolol succinate (TOPROL-XL) XL tablet 25 mg, 25 mg, Oral, DAILY, Stanton Kidney, MD, 25 mg at 01/15/19 [id number]  ???  polyvinyl alcohol-povidon(PF) (REFRESH CLASSIC) 1.4-0.6 % ophthalmic solution 2 Drop, 2 Drop, Both Eyes, DAILY, Jacci Ruberg, Tami Ribas, MD, 2 Drop at 01/15/19 0839  ???  tamsulosin (FLOMAX) capsule 0.8 mg, 0.8 mg, Oral, DAILY, Stanton Kidney, MD, 0.8 mg at 01/15/19 [id number]  ???  temazepam (RESTORIL) capsule 7.5 mg, 7.5 mg, Oral, QHS PRN, Stanton Kidney, MD, 7.5 mg at 01/14/19 2203  ???  naloxone (NARCAN) injection 0.1 mg, 0.1 mg, IntraVENous, PRN, Emmitt Matthews, Tami Ribas, MD  ???  ondansetron (ZOFRAN) injection 4 mg, 4 mg, IntraVENous, Q4H PRN, Delene Loll, Tami Ribas, MD  ???  enoxaparin (LOVENOX) injection 40 mg, 40 mg, SubCUTAneous, Q24H, Diamone Whistler, Tami Ribas, MD, 40 mg at 01/14/19 1707  ???  amLODIPine (NORVASC) tablet 10 mg, 10 mg, Oral, DAILY, Stanton Kidney, MD, 10 mg at 01/15/19 [id number]  ???  hydrALAZINE (APRESOLINE) 20 mg/mL injection 10 mg, 10 mg, IntraVENous, Q6H  PRN, Kellen Dutch, Tami Ribas, MD  ???  0.9% sodium chloride infusion 250 mL, 250 mL, IntraVENous, PRN, Delene Loll, Tami Ribas, MD  ???  ketorolac (TORADOL) injection 15 mg, 15 mg, IntraVENous, Q8H PRN, Stanton Kidney, MD, 15 mg at 01/12/19 [id number]      Lab:     Lab Data Reviewed: (see below)  Recent Results (from the past 24 hour(s))   D DIMER    Collection Time: 01/15/19  5:24 AM   Result Value Ref Range    D DIMER 0.80 (H) 0.01 - 0.50 ug/mL (FEU)   FERRITIN    Collection Time: 01/15/19  5:24 AM   Result Value Ref Range    Ferritin 126.0 26.0 - 388.0 ng/ml   C REACTIVE PROTEIN, QT    Collection Time: 01/15/19  5:24 AM   Result Value Ref Range    C-Reactive protein 17.4 (H) 0.0 - 2.9 mg/L   CBC WITH AUTOMATED DIFF    Collection Time: 01/15/19  5:24 AM   Result Value Ref Range    WBC 6.9 4.0 - 11.0 1000/mm3    RBC 4.03 3.80 - 5.70 M/uL    HGB 9.3 (L) 12.4 - 17.2 gm/dl    HCT 29.5 (L) 37.0 - 50.0 %    MCV 73.2 (L) 80.0 - 98.0 fL    MCH 23.1 23.0 - 34.6 pg    MCHC 31.5 30.0 - 36.0 gm/dl    PLATELET 191 140 - 450 1000/mm3    MPV N/A 6.0 - 10.0 fL    RDW-SD 57.2 (H) 35.1 - 43.9      NEUTROPHILS 85.7 (H) 34 - 64 %    LYMPHOCYTES 8.5 (L) 28 - 48 %    MONOCYTES 4.8 1 - 13 %    EOSINOPHILS 0.1 0 - 5 %    BASOPHILS 0.9 0 - 3 %    RBC Morphology NORMAL     MAGNESIUM    Collection Time: 01/15/19  5:24 AM   Result Value Ref Range    Magnesium 2.4 1.6 - 2.6 mg/dl   METABOLIC PANEL, BASIC    Collection Time: 01/15/19  5:24 AM   Result Value Ref Range    Sodium 142 136 - 145 mEq/L    Potassium 4.1 3.5 - 5.1 mEq/L    Chloride 110 (H)  98 - 107 mEq/L    CO2 26 21 - 32 mEq/L    Glucose 142 (H) 74 - 106 mg/dl    BUN 30 (H) 7 - 25 mg/dl    Creatinine 1.2 0.6 - 1.3 mg/dl    GFR est AA >60      GFR est non-AA >60      Calcium 8.4 (L) 8.5 - 10.1 mg/dl    Anion gap 6 5 - 15 mmol/L       [place] Retroperitoneum Comp    Result Date: 01/14/2019  RENAL AND BLADDER ULTRASOUND INDICATION: UTI with history of ureteral and kidney stones. COMPARISON: 07/15/2018 TECHNIQUE: The  examination was performed using grayscale and color Doppler. FINDINGS: Technologist notes that the examination is degraded secondary to patient condition. The right kidney measures 9.9 x 6.5 x 4.3 cm. The left kidney measures 11.2 x 7.4 x 4.1 cm. There is no hydronephrosis or nephrolithiasis. There are bilateral renal cysts measuring up to 1.9 x 1.4 x 1.7 cm within the upper pole of the right kidney and 1.7 x 1.5 x 1.7 cm within the lower pole of the left kidney. The kidneys are echogenic bilaterally. There are bilateral renal cortical calcifications. Bladder volume measures 54 cc. The bladder is suboptimally distended. Incidentally noted is splenomegaly and an echogenic liver.     IMPRESSION: 1.  Suboptimal exam. No hydronephrosis. Bilateral renal calcifications versus nonobstructing calculi. 2.  Bilateral renal cysts. 3.  Splenomegaly. 4.  Hepatic steatosis       Active Problems:    Pneumonia due to COVID-19 virus (01/10/2019)      ____________________________________________________________________      Attending Physician: Stanton Kidney, MD       Dragon medical dictation software was used for portions of this report. Unintended voice recognition errors may occur.

## 2019-01-16 LAB — FERRITIN
Ferritin: 101.6 ng/mL (ref 26.0–388.0)
Ferritin: 101.6 ng/ml (ref 26.0–388.0)

## 2019-01-16 LAB — METABOLIC PANEL, BASIC
Anion gap: 8 mmol/L (ref 5–15)
BUN: 40 mg/dl — ABNORMAL HIGH (ref 7–25)
CO2: 25 mEq/L (ref 21–32)
Calcium: 9 mg/dl (ref 8.5–10.1)
Chloride: 104 mEq/L (ref 98–107)
Creatinine: 1.5 mg/dl — ABNORMAL HIGH (ref 0.6–1.3)
GFR est AA: 59
GFR est non-AA: 48
Glucose: 181 mg/dl — ABNORMAL HIGH (ref 74–106)
Potassium: 4.1 mEq/L (ref 3.5–5.1)
Sodium: 138 mEq/L (ref 136–145)

## 2019-01-16 LAB — CBC WITH AUTOMATED DIFF
BASOPHILS: 0.1 % (ref 0–3)
EOSINOPHILS: 0 % (ref 0–5)
HCT: 32.3 % — ABNORMAL LOW (ref 37.0–50.0)
HGB: 9.7 gm/dl — ABNORMAL LOW (ref 12.4–17.2)
IMMATURE GRANULOCYTES: 1.1 % (ref 0.0–3.0)
LYMPHOCYTES: 8.9 % — ABNORMAL LOW (ref 28–48)
MCH: 22.4 pg — ABNORMAL LOW (ref 23.0–34.6)
MCHC: 30 gm/dl (ref 30.0–36.0)
MCV: 74.6 fL — ABNORMAL LOW (ref 80.0–98.0)
MONOCYTES: 2.8 % (ref 1–13)
NEUTROPHILS: 87.1 % — ABNORMAL HIGH (ref 34–64)
NRBC: 0 (ref 0–0)
PLATELET: 216 10*3/uL (ref 140–450)
RBC: 4.33 M/uL (ref 3.80–5.70)
RDW-SD: 56.1 — ABNORMAL HIGH (ref 35.1–43.9)
WBC: 7.2 10*3/uL (ref 4.0–11.0)

## 2019-01-16 LAB — D DIMER: D DIMER: 0.78 ug/mL (FEU) — ABNORMAL HIGH (ref 0.01–0.50)

## 2019-01-16 LAB — MAGNESIUM
Magnesium: 2.4 mg/dL (ref 1.6–2.6)
Magnesium: 2.4 mg/dl (ref 1.6–2.6)

## 2019-01-16 LAB — CULTURE, BLOOD
Blood Culture Result: NO GROWTH
Blood Culture Result: NO GROWTH

## 2019-01-16 LAB — C REACTIVE PROTEIN, QT: C-Reactive protein: 16.9 mg/L — ABNORMAL HIGH (ref 0.0–2.9)

## 2019-01-16 LAB — BASIC METABOLIC PANEL
Anion Gap: 8 mmol/L (ref 5–15)
BUN: 40 mg/dl — ABNORMAL HIGH (ref 7–25)
CO2: 25 mEq/L (ref 21–32)
Calcium: 9 mg/dl (ref 8.5–10.1)
Chloride: 104 mEq/L (ref 98–107)
Creatinine: 1.5 mg/dl — ABNORMAL HIGH (ref 0.6–1.3)
EGFR IF NonAfrican American: 48
GFR African American: 59
Glucose: 181 mg/dl — ABNORMAL HIGH (ref 74–106)
Potassium: 4.1 mEq/L (ref 3.5–5.1)
Sodium: 138 mEq/L (ref 136–145)

## 2019-01-16 LAB — CBC WITH AUTO DIFFERENTIAL
Basophils %: 0.1 % (ref 0–3)
Eosinophils %: 0 % (ref 0–5)
Hematocrit: 32.3 % — ABNORMAL LOW (ref 37.0–50.0)
Hemoglobin: 9.7 gm/dl — ABNORMAL LOW (ref 12.4–17.2)
Immature Granulocytes: 1.1 % (ref 0.0–3.0)
Lymphocytes %: 8.9 % — ABNORMAL LOW (ref 28–48)
MCH: 22.4 pg — ABNORMAL LOW (ref 23.0–34.6)
MCHC: 30 gm/dl (ref 30.0–36.0)
MCV: 74.6 fL — ABNORMAL LOW (ref 80.0–98.0)
Monocytes %: 2.8 % (ref 1–13)
Neutrophils %: 87.1 % — ABNORMAL HIGH (ref 34–64)
Nucleated RBCs: 0 (ref 0–0)
Platelets: 216 10*3/uL (ref 140–450)
RBC: 4.33 M/uL (ref 3.80–5.70)
RDW-SD: 56.1 — ABNORMAL HIGH (ref 35.1–43.9)
WBC: 7.2 10*3/uL (ref 4.0–11.0)

## 2019-01-16 LAB — D-DIMER, QUANTITATIVE: D-Dimer, Quant: 0.78 ug/mL (FEU) — ABNORMAL HIGH (ref 0.01–0.50)

## 2019-01-16 LAB — CULTURE, BLOOD 1
BLOOD CULTURE RESULT: NO GROWTH
BLOOD CULTURE RESULT: NO GROWTH

## 2019-01-16 LAB — C-REACTIVE PROTEIN: CRP: 16.9 mg/L — ABNORMAL HIGH (ref 0.0–2.9)

## 2019-01-16 MED FILL — CREON 24,000-76,000-120,000 UNIT CAPSULE,DELAYED RELEASE: 24000-76000 -120,000 unit | ORAL | Qty: 1

## 2019-01-16 MED FILL — METOPROLOL SUCCINATE SR 25 MG 24 HR TAB: 25 mg | ORAL | Qty: 1

## 2019-01-16 MED FILL — FLORASTOR 250 MG CAPSULE: 250 mg | ORAL | Qty: 1

## 2019-01-16 MED FILL — GABAPENTIN 300 MG CAP: 300 mg | ORAL | Qty: 2

## 2019-01-16 MED FILL — ALLOPURINOL 300 MG TAB: 300 mg | ORAL | Qty: 1

## 2019-01-16 MED FILL — AMLODIPINE 5 MG TAB: 5 mg | ORAL | Qty: 2

## 2019-01-16 MED FILL — TEMAZEPAM 7.5 MG CAP: 7.5 mg | ORAL | Qty: 1

## 2019-01-16 MED FILL — DEXAMETHASONE 2 MG TAB: 2 mg | ORAL | Qty: 3

## 2019-01-16 MED FILL — ENOXAPARIN 40 MG/0.4 ML SUB-Q SYRINGE: 40 mg/0.4 mL | SUBCUTANEOUS | Qty: 0.4

## 2019-01-16 MED FILL — POLYVINYL ALCOHOL-POVIDONE 1.4 %-0.6 % EYE DROPPERETTE: OPHTHALMIC | Qty: 1

## 2019-01-16 MED FILL — LEVOFLOXACIN 750 MG TAB: 750 mg | ORAL | Qty: 1

## 2019-01-16 MED FILL — TAMSULOSIN SR 0.4 MG 24 HR CAP: 0.4 mg | ORAL | Qty: 2

## 2019-01-16 MED FILL — PROMETHAZINE IN NS 25 MG/50 ML IV PIGGY BAG: 25 mg/50 ml | INTRAVENOUS | Qty: 50

## 2019-01-16 NOTE — Progress Notes (Signed)
73yo M    ?? Acute hypoxemic respiratory failure due to COVID-19 pneumonitis  ?? COVID-19 pneumonitis.  Completed Remdesivir.  On Decadron  ?? Streptococcal pneumoniae pneumonia, positive strep pneumo urine antigen  ?? Urine culture growing Klebsiella  and E-coli sensitive to cephalosporin  ?? Hypertension  ?? BPH  ?? History of pancreatic cancer  ?? History of left hydronephrosis and stent placement     Heart 129/71  Resp  Sat 91% on 12L  Renal  I/O /1555  Lytes OK  BUN/cr 40/1.5  Afebrile   WBC 7.2    Ferritin 101.6(126.0)  CRP 16.9(17.4)    Markers trending down

## 2019-01-16 NOTE — Progress Notes (Signed)
Problem: Gas Exchange - Impaired  Goal: *Absence of hypoxia  Outcome: Progressing Towards Goal     Problem: Patient Education: Go to Patient Education Activity  Goal: Patient/Family Education  Outcome: Progressing Towards Goal

## 2019-01-16 NOTE — Progress Notes (Signed)
Medical Progress Note      NAME: Tommy Brown   DOB:  1945/05/10  MRN:             [id number]    Date:  01/16/2019        Assessment:     1. Acute hypoxic respiratory failure  2. COVID-19 infection  3. Multifocal pneumonia  4. Pancytopenia  5. UTI with Klebsiella pneumoniae  6. Hypertension  7. CAD status post CABG  8. Gout  9. Pancreatic cancer  10. Neuropathy    Plan:     ?? Continue supplemental oxygen,   ?? Continue dexamethasone daily complete 10 days course of treatment  ?? Patient already received convalescent plasma and completed remdesivir  ?? Continue antibiotics per ID  ?? Pulmonary and ID also following   ?? Discussed with the patient   ?? Discussed with and updated his wife    Subjective:     Shortness of breathing, denies fever    Objective:     Vitals:    Last 24hrs VS reviewed since prior progress note. Most recent are:  Visit Vitals  BP (!) 111/55 (BP 1 Location: Left arm, BP Patient Position: Supine)   Pulse 84   Temp 97.5 ??F (36.4 ??C)   Resp 19   Wt 61.2 kg (135 lb)   SpO2 90%   BMI 20.53 kg/m??     SpO2 Readings from Last 6 Encounters:   01/16/19 90%   07/31/18 98%   07/17/18 99%   06/26/18 97%   01/21/18 96%    O2 Flow Rate (L/min): 12 l/min       Intake/Output Summary (Last 24 hours) at 01/16/2019 1402  Last data filed at 01/16/2019 1329  Gross per 24 hour   Intake 320 ml   Output 1295 ml   Net -975 ml        Exam:     General:   Not in acute distress  HEENT: PERRLA, Neck Supple,  No JVD  Respiratory:   CTA bilaterally-no wheezes, rales, rhonchi, or crackles  Cardiac:  Regular Rate and Rythmn  - no murmurs, rubs or gallops  Abdominal:  Soft, non-tender, non-distended, positive bowel sounds  Extremities:  No cyanosis, or edema.  Skin: No rash  Neurological:  No focal neurological deficits    Medication:   Current Medications Reviewed    Current Facility-Administered Medications:    ???  levoFLOXacin (LEVAQUIN) tablet 750 mg, 750 mg, Oral, Q24H, Scott, Berlinda Last, NP, 750 mg at 01/16/19 1203  ???  dexAMETHasone (DECADRON) tablet 6 mg, 6 mg, Oral, DAILY WITH DINNER, Issa Kosmicki, Tami Ribas, MD, 6 mg at 01/15/19 1740  ???  ketotifen (ZADITOR) 0.025 % (0.035 %) ophthalmic solution 1 Drop, 1 Drop, Both Eyes, BID, Stanton Kidney, MD, 1 Drop at 01/16/19 [phone]  ???  promethazine (PHENERGAN) 25 mg in NS IVPB, 25 mg, IntraVENous, Q6H PRN, Stanton Kidney, MD, Last Rate: 200 mL/hr at 01/16/19 1203, 25 mg at 01/16/19 1203  ???  allopurinoL (ZYLOPRIM) tablet 300 mg, 300 mg, Oral, DAILY, Fiana Gladu, Tami Ribas, MD, 300 mg at 01/16/19 [id number]  ???  gabapentin (NEURONTIN) capsule 600 mg, 600 mg, Oral, BID, Stanton Kidney, MD, 600 mg at 01/16/19 0836  ???  Saccharomyces boulardii (FLORASTOR) capsule 250 mg, 250 mg, Oral, ACB&D, Stanton Kidney, MD, 250 mg at 01/16/19 [id number]  ???  lipase-protease-amylase (CREON 24,000) capsule 1 Cap, 1 Cap, Oral, TID WITH MEALS, Flara Storti, Tami Ribas, MD, 1 Cap  at 01/16/19 1203  ???  metoprolol succinate (TOPROL-XL) XL tablet 25 mg, 25 mg, Oral, DAILY, Stanton Kidney, MD, 25 mg at 01/16/19 0836  ???  polyvinyl alcohol-povidon(PF) (REFRESH CLASSIC) 1.4-0.6 % ophthalmic solution 2 Drop, 2 Drop, Both Eyes, DAILY, Shey Yott, Tami Ribas, MD, 2 Drop at 01/16/19 0836  ???  tamsulosin (FLOMAX) capsule 0.8 mg, 0.8 mg, Oral, DAILY, Stanton Kidney, MD, 0.8 mg at 01/16/19 0835  ???  temazepam (RESTORIL) capsule 7.5 mg, 7.5 mg, Oral, QHS PRN, Stanton Kidney, MD, 7.5 mg at 01/15/19 2204  ???  naloxone (NARCAN) injection 0.1 mg, 0.1 mg, IntraVENous, PRN, Felice Hope, Tami Ribas, MD  ???  ondansetron (ZOFRAN) injection 4 mg, 4 mg, IntraVENous, Q4H PRN, Delene Loll, Tami Ribas, MD  ???  enoxaparin (LOVENOX) injection 40 mg, 40 mg, SubCUTAneous, Q24H, Tahji Durham, Tami Ribas, MD, 40 mg at 01/15/19 1740  ???  amLODIPine (NORVASC) tablet 10 mg, 10 mg, Oral, DAILY, Rogue Pautler, Tami Ribas, MD, 10 mg at 01/16/19 0836   ???  hydrALAZINE (APRESOLINE) 20 mg/mL injection 10 mg, 10 mg, IntraVENous, Q6H PRN, Aniesa Boback, Tami Ribas, MD  ???  0.9% sodium chloride infusion 250 mL, 250 mL, IntraVENous, PRN, Stanton Kidney, MD      Lab:     Lab Data Reviewed: (see below)  Recent Results (from the past 24 hour(s))   D DIMER    Collection Time: 01/16/19  4:09 AM   Result Value Ref Range    D DIMER 0.78 (H) 0.01 - 0.50 ug/mL (FEU)   FERRITIN    Collection Time: 01/16/19  4:09 AM   Result Value Ref Range    Ferritin 101.6 26.0 - 388.0 ng/ml   C REACTIVE PROTEIN, QT    Collection Time: 01/16/19  4:09 AM   Result Value Ref Range    C-Reactive protein 16.9 (H) 0.0 - 2.9 mg/L   CBC WITH AUTOMATED DIFF    Collection Time: 01/16/19  4:09 AM   Result Value Ref Range    WBC 7.2 4.0 - 11.0 1000/mm3    RBC 4.33 3.80 - 5.70 M/uL    HGB 9.7 (L) 12.4 - 17.2 gm/dl    HCT 32.3 (L) 37.0 - 50.0 %    MCV 74.6 (L) 80.0 - 98.0 fL    MCH 22.4 (L) 23.0 - 34.6 pg    MCHC 30.0 30.0 - 36.0 gm/dl    PLATELET 216 140 - 450 1000/mm3    MPV N/A 6.0 - 10.0 fL    RDW-SD 56.1 (H) 35.1 - 43.9      NRBC 0 0 - 0      IMMATURE GRANULOCYTES 1.1 0.0 - 3.0 %    NEUTROPHILS 87.1 (H) 34 - 64 %    LYMPHOCYTES 8.9 (L) 28 - 48 %    MONOCYTES 2.8 1 - 13 %    EOSINOPHILS 0.0 0 - 5 %    BASOPHILS 0.1 0 - 3 %   MAGNESIUM    Collection Time: 01/16/19  4:09 AM   Result Value Ref Range    Magnesium 2.4 1.6 - 2.6 mg/dl   METABOLIC PANEL, BASIC    Collection Time: 01/16/19  4:09 AM   Result Value Ref Range    Sodium 138 136 - 145 mEq/L    Potassium 4.1 3.5 - 5.1 mEq/L    Chloride 104 98 - 107 mEq/L    CO2 25 21 - 32 mEq/L    Glucose 181 (H) 74 - 106 mg/dl  BUN 40 (H) 7 - 25 mg/dl    Creatinine 1.5 (H) 0.6 - 1.3 mg/dl    GFR est AA 59.0      GFR est non-AA 48      Calcium 9.0 8.5 - 10.1 mg/dl    Anion gap 8 5 - 15 mmol/L       No results found.    Active Problems:    Pneumonia due to COVID-19 virus (01/10/2019)      ____________________________________________________________________       Attending Physician: Stanton Kidney, MD       Dragon medical dictation software was used for portions of this report. Unintended voice recognition errors may occur.

## 2019-01-16 NOTE — Progress Notes (Signed)
Problem: Falls - Risk of  Goal: *Absence of Falls  Description: Document Tommy Brown Fall Risk and appropriate interventions in the flowsheet.  Outcome: Progressing Towards Goal  Note: Fall Risk Interventions:  Mobility Interventions: Bed/chair exit alarm, Patient to call before getting OOB         Medication Interventions: Bed/chair exit alarm, Patient to call before getting OOB, Teach patient to arise slowly    Elimination Interventions: Bed/chair exit alarm, Call light in reach              Problem: Pressure Injury - Risk of  Goal: *Prevention of pressure injury  Description: Document Braden Scale and appropriate interventions in the flowsheet.  Outcome: Progressing Towards Goal  Note: Pressure Injury Interventions:  Sensory Interventions: Assess changes in LOC, Keep linens dry and wrinkle-free    Moisture Interventions: Minimize layers    Activity Interventions: Pressure redistribution bed/mattress(bed type)    Mobility Interventions: Pressure redistribution bed/mattress (bed type)    Nutrition Interventions: Document food/fluid/supplement intake

## 2019-01-16 NOTE — Progress Notes (Signed)
Remote note.  Chart reviewed. Notes, labs and imaging reviewed as needed.    Patient afebrile, WBC normal  Remains on high flow  Continue on levofloxacin  Patient has increase in creatinine to 1.5-?  Etiology-we will monitor at this time  Patient already has completed remdesivir and still on steroids  Inflammatory markers slightly better  Monitor labs as needed    Please call me if you have any question or concern.  Thank you.    [name], MD  January 16, 2019    [facility]   Office [place]  [phone]

## 2019-01-16 NOTE — Progress Notes (Signed)
Remote note.  Chart reviewed. Notes, labs and imaging reviewed as needed.    Patient afebrile, WBC normal  Remains on high flow  Continue on levofloxacin  Patient has increase in creatinine to 1.5-?  Etiology-we will monitor at this time  Patient already has completed remdesivir and still on steroids  Inflammatory markers slightly better  Monitor labs as needed    Please call me if you have any question or concern.  Thank you.    [name], MD  January 16, 2019    [facility] Infectious Disease   Office [place]  [phone]

## 2019-01-16 NOTE — Progress Notes (Signed)
Problem: Falls - Risk of  Goal: *Absence of Falls  Description: Document Patrcia Dolly Fall Risk and appropriate interventions in the flowsheet.  Outcome: Progressing Towards Goal  Note: Fall Risk Interventions:  Mobility Interventions: Bed/chair exit alarm, Patient to call before getting OOB         Medication Interventions: Bed/chair exit alarm, Patient to call before getting OOB, Teach patient to arise slowly    Elimination Interventions: Bed/chair exit alarm, Call light in reach              Problem: Pressure Injury - Risk of  Goal: *Prevention of pressure injury  Description: Document Braden Scale and appropriate interventions in the flowsheet.  Outcome: Progressing Towards Goal  Note: Pressure Injury Interventions:  Sensory Interventions: Assess changes in LOC, Keep linens dry and wrinkle-free    Moisture Interventions: Minimize layers    Activity Interventions: Pressure redistribution bed/mattress(bed type)    Mobility Interventions: Pressure redistribution bed/mattress (bed type)    Nutrition Interventions: Document food/fluid/supplement intake

## 2019-01-16 NOTE — Progress Notes (Signed)
Medical Progress Note      NAME: Tommy Brown   DOB:  27-May-1945  MRN:             [id number]    Date:  01/16/2019        Assessment:     1. Acute hypoxic respiratory failure  2. COVID-19 infection  3. Multifocal pneumonia  4. Pancytopenia  5. UTI with Klebsiella pneumoniae  6. Hypertension  7. CAD status post CABG  8. Gout  9. Pancreatic cancer  10. Neuropathy    Plan:     ?? Continue supplemental oxygen,   ?? Continue dexamethasone daily complete 10 days course of treatment  ?? Patient already received convalescent plasma and completed remdesivir  ?? Continue antibiotics per ID  ?? Pulmonary and ID also following   ?? Discussed with the patient   ?? Discussed with and updated his wife    Subjective:     Shortness of breathing, denies fever    Objective:     Vitals:    Last 24hrs VS reviewed since prior progress note. Most recent are:  Visit Vitals  BP (!) 111/55 (BP 1 Location: Left arm, BP Patient Position: Supine)   Pulse 84   Temp 97.5 ??F (36.4 ??C)   Resp 19   Wt 61.2 kg (135 lb)   SpO2 90%   BMI 20.53 kg/m??     SpO2 Readings from Last 6 Encounters:   01/16/19 90%   07/31/18 98%   07/17/18 99%   06/26/18 97%   01/21/18 96%    O2 Flow Rate (L/min): 12 l/min       Intake/Output Summary (Last 24 hours) at 01/16/2019 1402  Last data filed at 01/16/2019 1329  Gross per 24 hour   Intake 320 ml   Output 1295 ml   Net -975 ml        Exam:     General:   Not in acute distress  HEENT: PERRLA, Neck Supple,  No JVD  Respiratory:   CTA bilaterally-no wheezes, rales, rhonchi, or crackles  Cardiac:  Regular Rate and Rythmn  - no murmurs, rubs or gallops  Abdominal:  Soft, non-tender, non-distended, positive bowel sounds  Extremities:  No cyanosis, or edema.  Skin: No rash  Neurological:  No focal neurological deficits    Medication:   Current Medications Reviewed    Current Facility-Administered Medications:   ???  levoFLOXacin (LEVAQUIN) tablet 750 mg, 750 mg, Oral, Q24H, Scott, Berlinda Last, NP, 750 mg at  01/16/19 1203  ???  dexAMETHasone (DECADRON) tablet 6 mg, 6 mg, Oral, DAILY WITH DINNER, Arinze Rivadeneira, Tami Ribas, MD, 6 mg at 01/15/19 1740  ???  ketotifen (ZADITOR) 0.025 % (0.035 %) ophthalmic solution 1 Drop, 1 Drop, Both Eyes, BID, Stanton Kidney, MD, 1 Drop at 01/16/19 [phone]  ???  promethazine (PHENERGAN) 25 mg in NS IVPB, 25 mg, IntraVENous, Q6H PRN, Stanton Kidney, MD, Last Rate: 200 mL/hr at 01/16/19 1203, 25 mg at 01/16/19 1203  ???  allopurinoL (ZYLOPRIM) tablet 300 mg, 300 mg, Oral, DAILY, Pami Wool, Tami Ribas, MD, 300 mg at 01/16/19 [id number]  ???  gabapentin (NEURONTIN) capsule 600 mg, 600 mg, Oral, BID, Stanton Kidney, MD, 600 mg at 01/16/19 0836  ???  Saccharomyces boulardii (FLORASTOR) capsule 250 mg, 250 mg, Oral, ACB&D, Stanton Kidney, MD, 250 mg at 01/16/19 [id number]  ???  lipase-protease-amylase (CREON 24,000) capsule 1 Cap, 1 Cap, Oral, TID WITH MEALS, Koree Staheli, Tami Ribas, MD, 1 Cap  at 01/16/19 1203  ???  metoprolol succinate (TOPROL-XL) XL tablet 25 mg, 25 mg, Oral, DAILY, Stanton Kidney, MD, 25 mg at 01/16/19 0836  ???  polyvinyl alcohol-povidon(PF) (REFRESH CLASSIC) 1.4-0.6 % ophthalmic solution 2 Drop, 2 Drop, Both Eyes, DAILY, Ygnacio Fecteau, Tami Ribas, MD, 2 Drop at 01/16/19 0836  ???  tamsulosin (FLOMAX) capsule 0.8 mg, 0.8 mg, Oral, DAILY, Stanton Kidney, MD, 0.8 mg at 01/16/19 0835  ???  temazepam (RESTORIL) capsule 7.5 mg, 7.5 mg, Oral, QHS PRN, Stanton Kidney, MD, 7.5 mg at 01/15/19 2204  ???  naloxone (NARCAN) injection 0.1 mg, 0.1 mg, IntraVENous, PRN, Brentyn Seehafer, Tami Ribas, MD  ???  ondansetron (ZOFRAN) injection 4 mg, 4 mg, IntraVENous, Q4H PRN, Delene Loll, Tami Ribas, MD  ???  enoxaparin (LOVENOX) injection 40 mg, 40 mg, SubCUTAneous, Q24H, Angie Piercey, Tami Ribas, MD, 40 mg at 01/15/19 1740  ???  amLODIPine (NORVASC) tablet 10 mg, 10 mg, Oral, DAILY, Geovannie Vilar, Tami Ribas, MD, 10 mg at 01/16/19 0836  ???  hydrALAZINE (APRESOLINE) 20 mg/mL injection 10 mg, 10 mg, IntraVENous, Q6H PRN, Sharise Lippy, Tami Ribas, MD  ???  0.9% sodium chloride infusion 250 mL, 250 mL, IntraVENous,  PRN, Stanton Kidney, MD      Lab:     Lab Data Reviewed: (see below)  Recent Results (from the past 24 hour(s))   D DIMER    Collection Time: 01/16/19  4:09 AM   Result Value Ref Range    D DIMER 0.78 (H) 0.01 - 0.50 ug/mL (FEU)   FERRITIN    Collection Time: 01/16/19  4:09 AM   Result Value Ref Range    Ferritin 101.6 26.0 - 388.0 ng/ml   C REACTIVE PROTEIN, QT    Collection Time: 01/16/19  4:09 AM   Result Value Ref Range    C-Reactive protein 16.9 (H) 0.0 - 2.9 mg/L   CBC WITH AUTOMATED DIFF    Collection Time: 01/16/19  4:09 AM   Result Value Ref Range    WBC 7.2 4.0 - 11.0 1000/mm3    RBC 4.33 3.80 - 5.70 M/uL    HGB 9.7 (L) 12.4 - 17.2 gm/dl    HCT 32.3 (L) 37.0 - 50.0 %    MCV 74.6 (L) 80.0 - 98.0 fL    MCH 22.4 (L) 23.0 - 34.6 pg    MCHC 30.0 30.0 - 36.0 gm/dl    PLATELET 216 140 - 450 1000/mm3    MPV N/A 6.0 - 10.0 fL    RDW-SD 56.1 (H) 35.1 - 43.9      NRBC 0 0 - 0      IMMATURE GRANULOCYTES 1.1 0.0 - 3.0 %    NEUTROPHILS 87.1 (H) 34 - 64 %    LYMPHOCYTES 8.9 (L) 28 - 48 %    MONOCYTES 2.8 1 - 13 %    EOSINOPHILS 0.0 0 - 5 %    BASOPHILS 0.1 0 - 3 %   MAGNESIUM    Collection Time: 01/16/19  4:09 AM   Result Value Ref Range    Magnesium 2.4 1.6 - 2.6 mg/dl   METABOLIC PANEL, BASIC    Collection Time: 01/16/19  4:09 AM   Result Value Ref Range    Sodium 138 136 - 145 mEq/L    Potassium 4.1 3.5 - 5.1 mEq/L    Chloride 104 98 - 107 mEq/L    CO2 25 21 - 32 mEq/L    Glucose 181 (H) 74 - 106 mg/dl  BUN 40 (H) 7 - 25 mg/dl    Creatinine 1.5 (H) 0.6 - 1.3 mg/dl    GFR est AA 59.0      GFR est non-AA 48      Calcium 9.0 8.5 - 10.1 mg/dl    Anion gap 8 5 - 15 mmol/L       No results found.    Active Problems:    Pneumonia due to COVID-19 virus (01/10/2019)      ____________________________________________________________________      Attending Physician: Stanton Kidney, MD       Dragon medical dictation software was used for portions of this report. Unintended voice recognition errors may occur.

## 2019-01-17 LAB — CBC WITH AUTOMATED DIFF
HCT: 33.2 % — ABNORMAL LOW (ref 37.0–50.0)
HGB: 9.9 gm/dl — ABNORMAL LOW (ref 12.4–17.2)
LYMPHOCYTES: 6.7 % — ABNORMAL LOW (ref 28–48)
MCH: 22.4 pg — ABNORMAL LOW (ref 23.0–34.6)
MCHC: 29.8 gm/dl — ABNORMAL LOW (ref 30.0–36.0)
MCV: 75.1 fL — ABNORMAL LOW (ref 80.0–98.0)
MONOCYTES: 1 % (ref 1–13)
NEUTROPHILS: 92.3 % — ABNORMAL HIGH (ref 34–64)
Ovalocytes-DIF: 1
PLATELET COMMENTS: NORMAL
PLATELET: 201 10*3/uL (ref 140–450)
Poikilocytosis: 1
RBC: 4.42 M/uL (ref 3.80–5.70)
RDW-SD: 57.4 — ABNORMAL HIGH (ref 35.1–43.9)
WBC: 7.9 10*3/uL (ref 4.0–11.0)

## 2019-01-17 LAB — METABOLIC PANEL, COMPREHENSIVE
ALT (SGPT): 81 U/L — ABNORMAL HIGH (ref 12–78)
AST (SGOT): 50 U/L — ABNORMAL HIGH (ref 15–37)
Albumin: 2.5 gm/dl — ABNORMAL LOW (ref 3.4–5.0)
Alk. phosphatase: 100 U/L (ref 45–117)
Anion gap: 4 mmol/L — ABNORMAL LOW (ref 5–15)
BUN: 44 mg/dl — ABNORMAL HIGH (ref 7–25)
Bilirubin, total: 0.7 mg/dl (ref 0.2–1.0)
CO2: 30 mEq/L (ref 21–32)
Calcium: 8.6 mg/dl (ref 8.5–10.1)
Chloride: 106 mEq/L (ref 98–107)
Creatinine: 1.5 mg/dl — ABNORMAL HIGH (ref 0.6–1.3)
GFR est AA: 59
GFR est non-AA: 48
Glucose: 165 mg/dl — ABNORMAL HIGH (ref 74–106)
Potassium: 5.1 mEq/L (ref 3.5–5.1)
Protein, total: 6.8 gm/dl (ref 6.4–8.2)
Sodium: 140 mEq/L (ref 136–145)

## 2019-01-17 LAB — MAGNESIUM
Magnesium: 2.5 mg/dL (ref 1.6–2.6)
Magnesium: 2.5 mg/dl (ref 1.6–2.6)

## 2019-01-17 LAB — FERRITIN
Ferritin: 86.2 ng/mL (ref 26.0–388.0)
Ferritin: 86.2 ng/ml (ref 26.0–388.0)

## 2019-01-17 LAB — D DIMER: D DIMER: 0.61 ug/mL (FEU) — ABNORMAL HIGH (ref 0.01–0.50)

## 2019-01-17 LAB — C REACTIVE PROTEIN, QT: C-Reactive protein: 12.5 mg/L — ABNORMAL HIGH (ref 0.0–2.9)

## 2019-01-17 LAB — COMPREHENSIVE METABOLIC PANEL
ALT: 81 U/L — ABNORMAL HIGH (ref 12–78)
AST: 50 U/L — ABNORMAL HIGH (ref 15–37)
Albumin: 2.5 gm/dl — ABNORMAL LOW (ref 3.4–5.0)
Alkaline Phosphatase: 100 U/L (ref 45–117)
Anion Gap: 4 mmol/L — ABNORMAL LOW (ref 5–15)
BUN: 44 mg/dl — ABNORMAL HIGH (ref 7–25)
CO2: 30 mEq/L (ref 21–32)
Calcium: 8.6 mg/dl (ref 8.5–10.1)
Chloride: 106 mEq/L (ref 98–107)
Creatinine: 1.5 mg/dl — ABNORMAL HIGH (ref 0.6–1.3)
EGFR IF NonAfrican American: 48
GFR African American: 59
Glucose: 165 mg/dl — ABNORMAL HIGH (ref 74–106)
Potassium: 5.1 mEq/L (ref 3.5–5.1)
Sodium: 140 mEq/L (ref 136–145)
Total Bilirubin: 0.7 mg/dl (ref 0.2–1.0)
Total Protein: 6.8 gm/dl (ref 6.4–8.2)

## 2019-01-17 LAB — CBC WITH AUTO DIFFERENTIAL
Hematocrit: 33.2 % — ABNORMAL LOW (ref 37.0–50.0)
Hemoglobin: 9.9 gm/dl — ABNORMAL LOW (ref 12.4–17.2)
Lymphocytes %: 6.7 % — ABNORMAL LOW (ref 28–48)
MCH: 22.4 pg — ABNORMAL LOW (ref 23.0–34.6)
MCHC: 29.8 gm/dl — ABNORMAL LOW (ref 30.0–36.0)
MCV: 75.1 fL — ABNORMAL LOW (ref 80.0–98.0)
Monocytes %: 1 % (ref 1–13)
Neutrophils %: 92.3 % — ABNORMAL HIGH (ref 34–64)
OVALOCYTES-DIF: 1
Platelet Comment: NORMAL
Platelets: 201 10*3/uL (ref 140–450)
Poikilocytosis: 1
RBC: 4.42 M/uL (ref 3.80–5.70)
RDW-SD: 57.4 — ABNORMAL HIGH (ref 35.1–43.9)
WBC: 7.9 10*3/uL (ref 4.0–11.0)

## 2019-01-17 LAB — C-REACTIVE PROTEIN: CRP: 12.5 mg/L — ABNORMAL HIGH (ref 0.0–2.9)

## 2019-01-17 LAB — D-DIMER, QUANTITATIVE: D-Dimer, Quant: 0.61 ug/mL (FEU) — ABNORMAL HIGH (ref 0.01–0.50)

## 2019-01-17 MED FILL — CREON 24,000-76,000-120,000 UNIT CAPSULE,DELAYED RELEASE: 24000-76000 -120,000 unit | ORAL | Qty: 1

## 2019-01-17 MED FILL — POLYVINYL ALCOHOL-POVIDONE 1.4 %-0.6 % EYE DROPPERETTE: OPHTHALMIC | Qty: 1

## 2019-01-17 MED FILL — AMLODIPINE 5 MG TAB: 5 mg | ORAL | Qty: 2

## 2019-01-17 MED FILL — METOPROLOL SUCCINATE SR 25 MG 24 HR TAB: 25 mg | ORAL | Qty: 1

## 2019-01-17 MED FILL — ONDANSETRON (PF) 4 MG/2 ML INJECTION: 4 mg/2 mL | INTRAMUSCULAR | Qty: 2

## 2019-01-17 MED FILL — TAMSULOSIN SR 0.4 MG 24 HR CAP: 0.4 mg | ORAL | Qty: 2

## 2019-01-17 MED FILL — ALLOPURINOL 300 MG TAB: 300 mg | ORAL | Qty: 1

## 2019-01-17 MED FILL — FLORASTOR 250 MG CAPSULE: 250 mg | ORAL | Qty: 1

## 2019-01-17 MED FILL — LEVOFLOXACIN 750 MG TAB: 750 mg | ORAL | Qty: 1

## 2019-01-17 MED FILL — PROMETHAZINE IN NS 25 MG/50 ML IV PIGGY BAG: 25 mg/50 ml | INTRAVENOUS | Qty: 50

## 2019-01-17 MED FILL — ENOXAPARIN 40 MG/0.4 ML SUB-Q SYRINGE: 40 mg/0.4 mL | SUBCUTANEOUS | Qty: 0.4

## 2019-01-17 MED FILL — GABAPENTIN 300 MG CAP: 300 mg | ORAL | Qty: 2

## 2019-01-17 MED FILL — DEXAMETHASONE 2 MG TAB: 2 mg | ORAL | Qty: 3

## 2019-01-17 NOTE — Progress Notes (Signed)
Problem: Gas Exchange - Impaired  Goal: *Absence of hypoxia  01/17/2019 0826 by Darlis Loan, RT  Outcome: Progressing Towards Goal  01/17/2019 0826 by Darlis Loan, RT  Outcome: Progressing Towards Goal     Problem: Patient Education: Go to Patient Education Activity  Goal: Patient/Family Education  Outcome: Progressing Towards Goal

## 2019-01-17 NOTE — Progress Notes (Signed)
Problem: Gas Exchange - Impaired  Goal: *Absence of hypoxia  Outcome: Progressing Towards Goal

## 2019-01-17 NOTE — Progress Notes (Signed)
Remote note.  Chart reviewed. Notes, labs and imaging reviewed as needed.    Patient afebrile, WBC normal  Remains on high flow  Continue on levofloxacin for total 5 days  Creatinine remains elevated at 1.5-?  Etiology-we will monitor at this time  Patient already has completed remdesivir and still on steroids  Inflammatory markers slightly better  Monitor labs as needed    Please call me if you have any question or concern.  Thank you.    [name], MD  January 17, 2019    [facility]   Office [place]  [phone]

## 2019-01-17 NOTE — Progress Notes (Signed)
Medical Progress Note      NAME: Tommy Brown   DOB:  05-17-1945  MRN:             [id number]    Date:  01/17/2019        Assessment:     1. Acute hypoxic respiratory failure  2. COVID-19 infection  3. Multifocal pneumonia  4. Pancytopenia  5. UTI with Klebsiella pneumoniae  6. Hypertension  7. CAD status post CABG  8. Gout  9. Pancreatic cancer  10. Neuropathy    Plan:     ?? Monitor respiratory status   ?? Continue supplemental oxygen and titrate as needed.  We will try to wean him follow-up as tolerated   ?? Continue dexamethasone daily complete 10 days course of treatment  ?? Patient already received convalescent plasma and completed remdesivir  ?? Pulmonary and ID also following   ?? Discussed with the patient   ?? Discussed with and updated his wife    Subjective:     Shortness of breathing, denies fever    Objective:     Vitals:    Last 24hrs VS reviewed since prior progress note. Most recent are:  Visit Vitals  BP 126/65 (BP 1 Location: Left arm, BP Patient Position: Supine)   Pulse 74   Temp 97.6 ??F (36.4 ??C)   Resp 20   Wt 61.2 kg (135 lb)   SpO2 92%   BMI 20.53 kg/m??     SpO2 Readings from Last 6 Encounters:   01/17/19 92%   07/31/18 98%   07/17/18 99%   06/26/18 97%   01/21/18 96%    O2 Flow Rate (L/min): 9 l/min       Intake/Output Summary (Last 24 hours) at 01/17/2019 1231  Last data filed at 01/17/2019 5956  Gross per 24 hour   Intake 700 ml   Output 900 ml   Net -200 ml        Exam:     General:   Not in acute distress  HEENT: PERRLA, Neck Supple,  No JVD  Respiratory:   CTA bilaterally-no wheezes, rales, rhonchi, or crackles  Cardiac:  Regular Rate and Rythmn  - no murmurs, rubs or gallops  Abdominal:  Soft, non-tender, non-distended, positive bowel sounds  Extremities:  No cyanosis, or edema.  Skin: No rash  Neurological:  No focal neurological deficits    Medication:   Current Medications Reviewed    Current Facility-Administered Medications:    ???  levoFLOXacin (LEVAQUIN) tablet 750 mg, 750 mg, Oral, Q24H, Scott, Berlinda Last, NP, 750 mg at 01/17/19 1148  ???  dexAMETHasone (DECADRON) tablet 6 mg, 6 mg, Oral, DAILY WITH DINNER, Mousa Prout, Tami Ribas, MD, 6 mg at 01/16/19 1627  ???  ketotifen (ZADITOR) 0.025 % (0.035 %) ophthalmic solution 1 Drop, 1 Drop, Both Eyes, BID, Stanton Kidney, MD, 1 Drop at 01/17/19 0935  ???  promethazine (PHENERGAN) 25 mg in NS IVPB, 25 mg, IntraVENous, Q6H PRN, Stanton Kidney, MD, Last Rate: 200 mL/hr at 01/16/19 2105, 25 mg at 01/16/19 2105  ???  allopurinoL (ZYLOPRIM) tablet 300 mg, 300 mg, Oral, DAILY, Henlee Donovan, Tami Ribas, MD, 300 mg at 01/17/19 3875  ???  gabapentin (NEURONTIN) capsule 600 mg, 600 mg, Oral, BID, Stanton Kidney, MD, 600 mg at 01/17/19 6433  ???  Saccharomyces boulardii (FLORASTOR) capsule 250 mg, 250 mg, Oral, ACB&D, Stanton Kidney, MD, 250 mg at 01/17/19 2951  ???  lipase-protease-amylase (CREON 24,000) capsule 1  Cap, 1 Cap, Oral, TID WITH MEALS, Stanton Kidney, MD, 1 Cap at 01/17/19 1148  ???  metoprolol succinate (TOPROL-XL) XL tablet 25 mg, 25 mg, Oral, DAILY, Stanton Kidney, MD, 25 mg at 01/17/19 0933  ???  polyvinyl alcohol-povidon(PF) (REFRESH CLASSIC) 1.4-0.6 % ophthalmic solution 2 Drop, 2 Drop, Both Eyes, DAILY, Javonta Gronau, Tami Ribas, MD, 2 Drop at 01/17/19 0934  ???  tamsulosin (FLOMAX) capsule 0.8 mg, 0.8 mg, Oral, DAILY, Alona Danford, Tami Ribas, MD, 0.8 mg at 01/17/19 4332  ???  temazepam (RESTORIL) capsule 7.5 mg, 7.5 mg, Oral, QHS PRN, Stanton Kidney, MD, 7.5 mg at 01/15/19 2204  ???  naloxone (NARCAN) injection 0.1 mg, 0.1 mg, IntraVENous, PRN, Baillie Mohammad, Tami Ribas, MD  ???  ondansetron (ZOFRAN) injection 4 mg, 4 mg, IntraVENous, Q4H PRN, Delene Loll, Tami Ribas, MD  ???  enoxaparin (LOVENOX) injection 40 mg, 40 mg, SubCUTAneous, Q24H, Asharia Lotter, Tami Ribas, MD, 40 mg at 01/16/19 1627  ???  amLODIPine (NORVASC) tablet 10 mg, 10 mg, Oral, DAILY, Delene Loll, Tami Ribas, MD, 10 mg at 01/17/19 0934   ???  hydrALAZINE (APRESOLINE) 20 mg/mL injection 10 mg, 10 mg, IntraVENous, Q6H PRN, Delene Loll, Tami Ribas, MD  ???  0.9% sodium chloride infusion 250 mL, 250 mL, IntraVENous, PRN, Stanton Kidney, MD      Lab:     Lab Data Reviewed: (see below)  Recent Results (from the past 24 hour(s))   D DIMER    Collection Time: 01/17/19  3:20 AM   Result Value Ref Range    D DIMER 0.61 (H) 0.01 - 0.50 ug/mL (FEU)   FERRITIN    Collection Time: 01/17/19  3:20 AM   Result Value Ref Range    Ferritin 86.2 26.0 - 388.0 ng/ml   C REACTIVE PROTEIN, QT    Collection Time: 01/17/19  3:20 AM   Result Value Ref Range    C-Reactive protein 12.5 (H) 0.0 - 2.9 mg/L   METABOLIC PANEL, COMPREHENSIVE    Collection Time: 01/17/19  3:20 AM   Result Value Ref Range    Sodium 140 136 - 145 mEq/L    Potassium 5.1 3.5 - 5.1 mEq/L    Chloride 106 98 - 107 mEq/L    CO2 30 21 - 32 mEq/L    Glucose 165 (H) 74 - 106 mg/dl    BUN 44 (H) 7 - 25 mg/dl    Creatinine 1.5 (H) 0.6 - 1.3 mg/dl    GFR est AA 59.0      GFR est non-AA 48      Calcium 8.6 8.5 - 10.1 mg/dl    AST (SGOT) 50 (H) 15 - 37 U/L    ALT (SGPT) 81 (H) 12 - 78 U/L    Alk. phosphatase 100 45 - 117 U/L    Bilirubin, total 0.7 0.2 - 1.0 mg/dl    Protein, total 6.8 6.4 - 8.2 gm/dl    Albumin 2.5 (L) 3.4 - 5.0 gm/dl    Anion gap 4 (L) 5 - 15 mmol/L   CBC WITH AUTOMATED DIFF    Collection Time: 01/17/19  3:20 AM   Result Value Ref Range    WBC 7.9 4.0 - 11.0 1000/mm3    NEUTROPHILS 92.3 (H) 34 - 64 %    LYMPHOCYTES 6.7 (L) 28 - 48 %    RBC 4.42 3.80 - 5.70 M/uL    MONOCYTES 1.0 1 - 13 %    HGB 9.9 (L) 12.4 - 17.2 gm/dl  HCT 33.2 (L) 37.0 - 50.0 %    MCV 75.1 (L) 80.0 - 98.0 fL    MCH 22.4 (L) 23.0 - 34.6 pg    MCHC 29.8 (L) 30.0 - 36.0 gm/dl    PLATELET 201 140 - 450 1000/mm3    MPV SEE FOOTNOTE 6.0 - 10.0 fL    RDW-SD 57.4 (H) 35.1 - 43.9      Poikilocytosis 1      Ovalocytes-DIF 1      Dacrocytes (Teardrop cells) OCCASIONAL      Elliptocytes OCCASIONAL      PLATELET COMMENTS NORMAL       Giant platelets OCCASIONAL      Smudge cells OCCASIONAL     MAGNESIUM    Collection Time: 01/17/19  3:20 AM   Result Value Ref Range    Magnesium 2.5 1.6 - 2.6 mg/dl       No results found.    Active Problems:    Pneumonia due to COVID-19 virus (01/10/2019)      ____________________________________________________________________      Attending Physician: Stanton Kidney, MD       Dragon medical dictation software was used for portions of this report. Unintended voice recognition errors may occur.

## 2019-01-17 NOTE — Progress Notes (Signed)
73yo M    ?? Acute hypoxemic respiratory failure due to COVID-19 pneumonitis  ?? COVID-19 pneumonitis.  Completed Remdesivir.  On Decadron  ?? Streptococcal pneumoniae pneumonia, positive strep pneumo urine antigen  ?? Urine culture growing Klebsiella  and E-coli sensitive to cephalosporin  ?? Hypertension  ?? BPH  ?? History of pancreatic cancer  ?? History of left hydronephrosis and stent placement     Heart 120/77  Resp  Sat 95% on 9L  Renal  I/O 660/400  [place]  BUN/cr 44/1.5(40/1.5)  Afebrile   WBC 7.9(7.2)    Ferritin  86.2(101.6)  CRP 12.5(16.9)    Markers trending down

## 2019-01-17 NOTE — Progress Notes (Signed)
Problem: Gas Exchange - Impaired  Goal: *Absence of hypoxia  Outcome: Progressing Towards Goal     Problem: Patient Education: Go to Patient Education Activity  Goal: Patient/Family Education  Outcome: Progressing Towards Goal

## 2019-01-17 NOTE — Progress Notes (Signed)
Problem: Gas Exchange - Impaired  Goal: *Absence of hypoxia  Outcome: Progressing Towards Goal     Problem: Patient Education: Go to Patient Education Activity  Goal: Patient/Family Education  Outcome: Progressing Towards Goal

## 2019-01-17 NOTE — Progress Notes (Signed)
Remote note.  Chart reviewed. Notes, labs and imaging reviewed as needed.    Patient afebrile, WBC normal  Remains on high flow  Continue on levofloxacin for total 5 days  Creatinine remains elevated at 1.5-?  Etiology-we will monitor at this time  Patient already has completed remdesivir and still on steroids  Inflammatory markers slightly better  Monitor labs as needed    Please call me if you have any question or concern.  Thank you.    [name], MD  January 17, 2019    [facility] Infectious Disease   Office [place]  [phone]

## 2019-01-17 NOTE — Progress Notes (Signed)
Medical Progress Note      NAME: Tommy Brown   DOB:  1945/08/06  MRN:             [id number]    Date:  01/17/2019        Assessment:     1. Acute hypoxic respiratory failure  2. COVID-19 infection  3. Multifocal pneumonia  4. Pancytopenia  5. UTI with Klebsiella pneumoniae  6. Hypertension  7. CAD status post CABG  8. Gout  9. Pancreatic cancer  10. Neuropathy    Plan:     ?? Monitor respiratory status   ?? Continue supplemental oxygen and titrate as needed.  We will try to wean him follow-up as tolerated   ?? Continue dexamethasone daily complete 10 days course of treatment  ?? Patient already received convalescent plasma and completed remdesivir  ?? Pulmonary and ID also following   ?? Discussed with the patient   ?? Discussed with and updated his wife    Subjective:     Shortness of breathing, denies fever    Objective:     Vitals:    Last 24hrs VS reviewed since prior progress note. Most recent are:  Visit Vitals  BP 126/65 (BP 1 Location: Left arm, BP Patient Position: Supine)   Pulse 74   Temp 97.6 ??F (36.4 ??C)   Resp 20   Wt 61.2 kg (135 lb)   SpO2 92%   BMI 20.53 kg/m??     SpO2 Readings from Last 6 Encounters:   01/17/19 92%   07/31/18 98%   07/17/18 99%   06/26/18 97%   01/21/18 96%    O2 Flow Rate (L/min): 9 l/min       Intake/Output Summary (Last 24 hours) at 01/17/2019 1231  Last data filed at 01/17/2019 4166  Gross per 24 hour   Intake 700 ml   Output 900 ml   Net -200 ml        Exam:     General:   Not in acute distress  HEENT: PERRLA, Neck Supple,  No JVD  Respiratory:   CTA bilaterally-no wheezes, rales, rhonchi, or crackles  Cardiac:  Regular Rate and Rythmn  - no murmurs, rubs or gallops  Abdominal:  Soft, non-tender, non-distended, positive bowel sounds  Extremities:  No cyanosis, or edema.  Skin: No rash  Neurological:  No focal neurological deficits    Medication:   Current Medications Reviewed    Current Facility-Administered Medications:   ???  levoFLOXacin (LEVAQUIN) tablet 750  mg, 750 mg, Oral, Q24H, Scott, Berlinda Last, NP, 750 mg at 01/17/19 1148  ???  dexAMETHasone (DECADRON) tablet 6 mg, 6 mg, Oral, DAILY WITH DINNER, Cashtyn Pouliot, Tami Ribas, MD, 6 mg at 01/16/19 1627  ???  ketotifen (ZADITOR) 0.025 % (0.035 %) ophthalmic solution 1 Drop, 1 Drop, Both Eyes, BID, Stanton Kidney, MD, 1 Drop at 01/17/19 0935  ???  promethazine (PHENERGAN) 25 mg in NS IVPB, 25 mg, IntraVENous, Q6H PRN, Stanton Kidney, MD, Last Rate: 200 mL/hr at 01/16/19 2105, 25 mg at 01/16/19 2105  ???  allopurinoL (ZYLOPRIM) tablet 300 mg, 300 mg, Oral, DAILY, Jaquavious Mercer, Tami Ribas, MD, 300 mg at 01/17/19 0630  ???  gabapentin (NEURONTIN) capsule 600 mg, 600 mg, Oral, BID, Stanton Kidney, MD, 600 mg at 01/17/19 1601  ???  Saccharomyces boulardii (FLORASTOR) capsule 250 mg, 250 mg, Oral, ACB&D, Stanton Kidney, MD, 250 mg at 01/17/19 0932  ???  lipase-protease-amylase (CREON 24,000) capsule 1  Cap, 1 Cap, Oral, TID WITH MEALS, Stanton Kidney, MD, 1 Cap at 01/17/19 1148  ???  metoprolol succinate (TOPROL-XL) XL tablet 25 mg, 25 mg, Oral, DAILY, Stanton Kidney, MD, 25 mg at 01/17/19 0933  ???  polyvinyl alcohol-povidon(PF) (REFRESH CLASSIC) 1.4-0.6 % ophthalmic solution 2 Drop, 2 Drop, Both Eyes, DAILY, Keylen Uzelac, Tami Ribas, MD, 2 Drop at 01/17/19 0934  ???  tamsulosin (FLOMAX) capsule 0.8 mg, 0.8 mg, Oral, DAILY, Alexsys Eskin, Tami Ribas, MD, 0.8 mg at 01/17/19 0981  ???  temazepam (RESTORIL) capsule 7.5 mg, 7.5 mg, Oral, QHS PRN, Stanton Kidney, MD, 7.5 mg at 01/15/19 2204  ???  naloxone (NARCAN) injection 0.1 mg, 0.1 mg, IntraVENous, PRN, Rikki Smestad, Tami Ribas, MD  ???  ondansetron (ZOFRAN) injection 4 mg, 4 mg, IntraVENous, Q4H PRN, Delene Loll, Tami Ribas, MD  ???  enoxaparin (LOVENOX) injection 40 mg, 40 mg, SubCUTAneous, Q24H, Candi Profit, Tami Ribas, MD, 40 mg at 01/16/19 1627  ???  amLODIPine (NORVASC) tablet 10 mg, 10 mg, Oral, DAILY, Delene Loll, Tami Ribas, MD, 10 mg at 01/17/19 0934  ???  hydrALAZINE (APRESOLINE) 20 mg/mL injection 10 mg, 10 mg, IntraVENous, Q6H PRN, Delene Loll, Tami Ribas, MD  ???  0.9%  sodium chloride infusion 250 mL, 250 mL, IntraVENous, PRN, Stanton Kidney, MD      Lab:     Lab Data Reviewed: (see below)  Recent Results (from the past 24 hour(s))   D DIMER    Collection Time: 01/17/19  3:20 AM   Result Value Ref Range    D DIMER 0.61 (H) 0.01 - 0.50 ug/mL (FEU)   FERRITIN    Collection Time: 01/17/19  3:20 AM   Result Value Ref Range    Ferritin 86.2 26.0 - 388.0 ng/ml   C REACTIVE PROTEIN, QT    Collection Time: 01/17/19  3:20 AM   Result Value Ref Range    C-Reactive protein 12.5 (H) 0.0 - 2.9 mg/L   METABOLIC PANEL, COMPREHENSIVE    Collection Time: 01/17/19  3:20 AM   Result Value Ref Range    Sodium 140 136 - 145 mEq/L    Potassium 5.1 3.5 - 5.1 mEq/L    Chloride 106 98 - 107 mEq/L    CO2 30 21 - 32 mEq/L    Glucose 165 (H) 74 - 106 mg/dl    BUN 44 (H) 7 - 25 mg/dl    Creatinine 1.5 (H) 0.6 - 1.3 mg/dl    GFR est AA 59.0      GFR est non-AA 48      Calcium 8.6 8.5 - 10.1 mg/dl    AST (SGOT) 50 (H) 15 - 37 U/L    ALT (SGPT) 81 (H) 12 - 78 U/L    Alk. phosphatase 100 45 - 117 U/L    Bilirubin, total 0.7 0.2 - 1.0 mg/dl    Protein, total 6.8 6.4 - 8.2 gm/dl    Albumin 2.5 (L) 3.4 - 5.0 gm/dl    Anion gap 4 (L) 5 - 15 mmol/L   CBC WITH AUTOMATED DIFF    Collection Time: 01/17/19  3:20 AM   Result Value Ref Range    WBC 7.9 4.0 - 11.0 1000/mm3    NEUTROPHILS 92.3 (H) 34 - 64 %    LYMPHOCYTES 6.7 (L) 28 - 48 %    RBC 4.42 3.80 - 5.70 M/uL    MONOCYTES 1.0 1 - 13 %    HGB 9.9 (L) 12.4 - 17.2 gm/dl  HCT 33.2 (L) 37.0 - 50.0 %    MCV 75.1 (L) 80.0 - 98.0 fL    MCH 22.4 (L) 23.0 - 34.6 pg    MCHC 29.8 (L) 30.0 - 36.0 gm/dl    PLATELET 201 140 - 450 1000/mm3    MPV SEE FOOTNOTE 6.0 - 10.0 fL    RDW-SD 57.4 (H) 35.1 - 43.9      Poikilocytosis 1      Ovalocytes-DIF 1      Dacrocytes (Teardrop cells) OCCASIONAL      Elliptocytes OCCASIONAL      PLATELET COMMENTS NORMAL      Giant platelets OCCASIONAL      Smudge cells OCCASIONAL     MAGNESIUM    Collection Time: 01/17/19  3:20 AM   Result Value Ref  Range    Magnesium 2.5 1.6 - 2.6 mg/dl       No results found.    Active Problems:    Pneumonia due to COVID-19 virus (01/10/2019)      ____________________________________________________________________      Attending Physician: Stanton Kidney, MD       Dragon medical dictation software was used for portions of this report. Unintended voice recognition errors may occur.

## 2019-01-17 NOTE — Progress Notes (Signed)
Problem: Gas Exchange - Impaired  Goal: *Absence of hypoxia  01/17/2019 0826 by Arnell Asal, RT  Outcome: Progressing Towards Goal  01/17/2019 0826 by Arnell Asal, RT  Outcome: Progressing Towards Goal     Problem: Patient Education: Go to Patient Education Activity  Goal: Patient/Family Education  Outcome: Progressing Towards Goal

## 2019-01-18 ENCOUNTER — Encounter

## 2019-01-18 LAB — FERRITIN
Ferritin: 76.5 ng/mL (ref 26.0–388.0)
Ferritin: 76.5 ng/ml (ref 26.0–388.0)

## 2019-01-18 LAB — D DIMER: D DIMER: 0.56 ug/mL (FEU) — ABNORMAL HIGH (ref 0.01–0.50)

## 2019-01-18 LAB — METABOLIC PANEL, BASIC
Anion gap: 5 mmol/L (ref 5–15)
BUN: 44 mg/dl — ABNORMAL HIGH (ref 7–25)
CO2: 26 mEq/L (ref 21–32)
Calcium: 8.7 mg/dl (ref 8.5–10.1)
Chloride: 106 mEq/L (ref 98–107)
Creatinine: 1.5 mg/dl — ABNORMAL HIGH (ref 0.6–1.3)
GFR est AA: 59
GFR est non-AA: 48
Glucose: 181 mg/dl — ABNORMAL HIGH (ref 74–106)
Potassium: 5 mEq/L (ref 3.5–5.1)
Sodium: 137 mEq/L (ref 136–145)

## 2019-01-18 LAB — CBC WITH AUTOMATED DIFF
BASOPHILS: 0.1 % (ref 0–3)
EOSINOPHILS: 0.1 % (ref 0–5)
HCT: 32.1 % — ABNORMAL LOW (ref 37.0–50.0)
HGB: 9.7 gm/dl — ABNORMAL LOW (ref 12.4–17.2)
IMMATURE GRANULOCYTES: 1.2 % (ref 0.0–3.0)
LYMPHOCYTES: 7 % — ABNORMAL LOW (ref 28–48)
MCH: 22.8 pg — ABNORMAL LOW (ref 23.0–34.6)
MCHC: 30.2 gm/dl (ref 30.0–36.0)
MCV: 75.4 fL — ABNORMAL LOW (ref 80.0–98.0)
MONOCYTES: 2.5 % (ref 1–13)
NEUTROPHILS: 89.1 % — ABNORMAL HIGH (ref 34–64)
NRBC: 0 (ref 0–0)
PLATELET: 200 10*3/uL (ref 140–450)
RBC: 4.26 M/uL (ref 3.80–5.70)
RDW-SD: 58.3 — ABNORMAL HIGH (ref 35.1–43.9)
WBC: 6.8 10*3/uL (ref 4.0–11.0)

## 2019-01-18 LAB — MAGNESIUM
Magnesium: 2.7 mg/dl — ABNORMAL HIGH (ref 1.6–2.6)
Magnesium: 2.7 mg/dl — ABNORMAL HIGH (ref 1.6–2.6)

## 2019-01-18 LAB — C REACTIVE PROTEIN, QT: C-Reactive protein: 7.2 mg/L — ABNORMAL HIGH (ref 0.0–2.9)

## 2019-01-18 LAB — CBC WITH AUTO DIFFERENTIAL
Basophils %: 0.1 % (ref 0–3)
Eosinophils %: 0.1 % (ref 0–5)
Hematocrit: 32.1 % — ABNORMAL LOW (ref 37.0–50.0)
Hemoglobin: 9.7 gm/dl — ABNORMAL LOW (ref 12.4–17.2)
Immature Granulocytes: 1.2 % (ref 0.0–3.0)
Lymphocytes %: 7 % — ABNORMAL LOW (ref 28–48)
MCH: 22.8 pg — ABNORMAL LOW (ref 23.0–34.6)
MCHC: 30.2 gm/dl (ref 30.0–36.0)
MCV: 75.4 fL — ABNORMAL LOW (ref 80.0–98.0)
Monocytes %: 2.5 % (ref 1–13)
Neutrophils %: 89.1 % — ABNORMAL HIGH (ref 34–64)
Nucleated RBCs: 0 (ref 0–0)
Platelets: 200 10*3/uL (ref 140–450)
RBC: 4.26 M/uL (ref 3.80–5.70)
RDW-SD: 58.3 — ABNORMAL HIGH (ref 35.1–43.9)
WBC: 6.8 10*3/uL (ref 4.0–11.0)

## 2019-01-18 LAB — BASIC METABOLIC PANEL
Anion Gap: 5 mmol/L (ref 5–15)
BUN: 44 mg/dl — ABNORMAL HIGH (ref 7–25)
CO2: 26 mEq/L (ref 21–32)
Calcium: 8.7 mg/dl (ref 8.5–10.1)
Chloride: 106 mEq/L (ref 98–107)
Creatinine: 1.5 mg/dl — ABNORMAL HIGH (ref 0.6–1.3)
EGFR IF NonAfrican American: 48
GFR African American: 59
Glucose: 181 mg/dl — ABNORMAL HIGH (ref 74–106)
Potassium: 5 mEq/L (ref 3.5–5.1)
Sodium: 137 mEq/L (ref 136–145)

## 2019-01-18 LAB — D-DIMER, QUANTITATIVE: D-Dimer, Quant: 0.56 ug/mL (FEU) — ABNORMAL HIGH (ref 0.01–0.50)

## 2019-01-18 LAB — C-REACTIVE PROTEIN: CRP: 7.2 mg/L — ABNORMAL HIGH (ref 0.0–2.9)

## 2019-01-18 MED ORDER — ACETAMINOPHEN 325 MG TABLET
325 mg | Freq: Once | ORAL | Status: AC
Start: 2019-01-18 — End: 2019-01-18

## 2019-01-18 MED ORDER — FUROSEMIDE 10 MG/ML IJ SOLN
10 mg/mL | Freq: Two times a day (BID) | INTRAMUSCULAR | Status: DC
Start: 2019-01-18 — End: 2019-01-20
  Administered 2019-01-18 – 2019-01-20 (×5): via INTRAVENOUS

## 2019-01-18 MED FILL — FLORASTOR 250 MG CAPSULE: 250 mg | ORAL | Qty: 1

## 2019-01-18 MED FILL — METOPROLOL SUCCINATE SR 25 MG 24 HR TAB: 25 mg | ORAL | Qty: 1

## 2019-01-18 MED FILL — AMLODIPINE 5 MG TAB: 5 mg | ORAL | Qty: 2

## 2019-01-18 MED FILL — FUROSEMIDE 10 MG/ML IJ SOLN: 10 mg/mL | INTRAMUSCULAR | Qty: 2

## 2019-01-18 MED FILL — TAMSULOSIN SR 0.4 MG 24 HR CAP: 0.4 mg | ORAL | Qty: 2

## 2019-01-18 MED FILL — DEXAMETHASONE 2 MG TAB: 2 mg | ORAL | Qty: 3

## 2019-01-18 MED FILL — GABAPENTIN 300 MG CAP: 300 mg | ORAL | Qty: 2

## 2019-01-18 MED FILL — CREON 24,000-76,000-120,000 UNIT CAPSULE,DELAYED RELEASE: 24000-76000 -120,000 unit | ORAL | Qty: 1

## 2019-01-18 MED FILL — ENOXAPARIN 40 MG/0.4 ML SUB-Q SYRINGE: 40 mg/0.4 mL | SUBCUTANEOUS | Qty: 0.4

## 2019-01-18 MED FILL — LEVOFLOXACIN 750 MG TAB: 750 mg | ORAL | Qty: 1

## 2019-01-18 MED FILL — POLYVINYL ALCOHOL-POVIDONE 1.4 %-0.6 % EYE DROPPERETTE: OPHTHALMIC | Qty: 1

## 2019-01-18 MED FILL — ALLOPURINOL 300 MG TAB: 300 mg | ORAL | Qty: 1

## 2019-01-18 MED FILL — TEMAZEPAM 7.5 MG CAP: 7.5 mg | ORAL | Qty: 1

## 2019-01-18 MED FILL — TYLENOL 325 MG TABLET: 325 mg | ORAL | Qty: 2

## 2019-01-18 NOTE — Progress Notes (Signed)
Pulmonary / Critical Care Progress Note:  II  Summary:   Patient without prior medical history of pulmonary disease.  Non-smoker.  Admitted with COVID-19 pneumonitis.  Followed by ID also found to have positive urine antigen for strep pneumonia.  Received dexamethasone and remdesivir and convalescent plasma.   Interval History / ROS:    Doing better.  Decreased oxygen requirements now down to 6 L.  Discussed with RN and RT.  Working on his ICS and self proning.        IMPRESSION:   ?? Acute hypoxemic respiratory failure due to COVID-19 pneumonitis  ?? COVID-19 pneumonitis  ?? Streptococcal pneumoniae pneumonia, positive strep pneumo urine antigen  ?? Urine culture growing Klebsiella  and E-coli sensitive to cephalosporin  ?? Hypertension  ?? BPH  ?? History of pancreatic cancer  ?? History of left hydronephrosis and stent placement    ??   PLAN:   ?? Continue supplemental oxygen with aggressive weaning.  He is down to 6 L now.  ?? Educated patient to continue using his incentive spirometer.  ?? Continue proning and deep breathing exercise.  ?? Keep even to slightly negative.  ?? follow IN/OUT. Follow renal function   ?? Antibiotics per ID.  Monitor inflammatory markers-slow improvement. ??Monitor D-dimer level-relatively stable.  ?? Continue subcu Lovenox for DVT prophylaxis  ?? We will follow chart and follow intermittently.  ?? Further evaluation and management to follow depending on clinical progression and / or more available data.   ?? Further management of other conditions per primary team and others involved.  ??    ??    Assessment:   Patient Active Problem List    Diagnosis Date Noted   ??? Pneumonia due to COVID-19 virus 01/10/2019   ??? UTI (urinary tract infection) 07/15/2018   ??? AKI (acute kidney injury) ([place]) 06/22/2018   ??? Sepsis ([place]) 01/17/2018         Current Pertinent Medications:   Current Facility-Administered Medications   Medication Dose Route Frequency   ??? acetaminophen (TYLENOL) tablet 650 mg  650 mg Oral ONCE    ??? furosemide (LASIX) injection 20 mg  20 mg IntraVENous BID   ??? levoFLOXacin (LEVAQUIN) tablet 750 mg  750 mg Oral Q24H   ??? dexAMETHasone (DECADRON) tablet 6 mg  6 mg Oral DAILY WITH DINNER   ??? ketotifen (ZADITOR) 0.025 % (0.035 %) ophthalmic solution 1 Drop  1 Drop Both Eyes BID   ??? promethazine (PHENERGAN) 25 mg in NS IVPB  25 mg IntraVENous Q6H PRN   ??? allopurinoL (ZYLOPRIM) tablet 300 mg  300 mg Oral DAILY   ??? gabapentin (NEURONTIN) capsule 600 mg  600 mg Oral BID   ??? Saccharomyces boulardii (FLORASTOR) capsule 250 mg  250 mg Oral ACB&D   ??? lipase-protease-amylase (CREON 24,000) capsule 1 Cap  1 Cap Oral TID WITH MEALS   ??? metoprolol succinate (TOPROL-XL) XL tablet 25 mg  25 mg Oral DAILY   ??? polyvinyl alcohol-povidon(PF) (REFRESH CLASSIC) 1.4-0.6 % ophthalmic solution 2 Drop  2 Drop Both Eyes DAILY   ??? tamsulosin (FLOMAX) capsule 0.8 mg  0.8 mg Oral DAILY   ??? temazepam (RESTORIL) capsule 7.5 mg  7.5 mg Oral QHS PRN   ??? naloxone (NARCAN) injection 0.1 mg  0.1 mg IntraVENous PRN   ??? ondansetron (ZOFRAN) injection 4 mg  4 mg IntraVENous Q4H PRN   ??? enoxaparin (LOVENOX) injection 40 mg  40 mg SubCUTAneous Q24H   ??? amLODIPine (NORVASC) tablet 10 mg  10 mg Oral DAILY   ??? hydrALAZINE (APRESOLINE) 20 mg/mL injection 10 mg  10 mg IntraVENous Q6H PRN   ??? 0.9% sodium chloride infusion 250 mL  250 mL IntraVENous PRN       Objective:   Visit Vitals  BP 107/63 (BP 1 Location: Right arm, BP Patient Position: Supine)   Pulse 87   Temp 98.4 ??F (36.9 ??C)   Resp 20   Wt 61.2 kg (135 lb)   SpO2 96%   BMI 20.53 kg/m??       Intake/Output Summary (Last 24 hours) at 01/18/2019 1523  Last data filed at 01/18/2019 1227  Gross per 24 hour   Intake 530 ml   Output 1050 ml   Net -520 ml       In no   apparent distress.   Is  not using accessory muscles of breathing.   Oral cavity intact  Neck is supple.  No significant JVD.   Lungs fairly clear except for basilar ronchi.  No wheezing.   Heart sounds S1,S2.  Murmurs are absent   Abdomen non tender non distended  Ext: Edema is absent.  Pulses are palpable and symmetric  Neuro: non-focal.  Follows simple commands.       Pertinent Data Review:    CXR:  Reviewed both report and films.      Recent Results (from the past 24 hour(s))   D DIMER    Collection Time: 01/18/19  2:36 AM   Result Value Ref Range    D DIMER 0.56 (H) 0.01 - 0.50 ug/mL (FEU)   FERRITIN    Collection Time: 01/18/19  2:36 AM   Result Value Ref Range    Ferritin 76.5 26.0 - 388.0 ng/ml   C REACTIVE PROTEIN, QT    Collection Time: 01/18/19  2:36 AM   Result Value Ref Range    C-Reactive protein 7.2 (H) 0.0 - 2.9 mg/L   CBC WITH AUTOMATED DIFF    Collection Time: 01/18/19  2:36 AM   Result Value Ref Range    WBC 6.8 4.0 - 11.0 1000/mm3    RBC 4.26 3.80 - 5.70 M/uL    HGB 9.7 (L) 12.4 - 17.2 gm/dl    HCT 32.1 (L) 37.0 - 50.0 %    MCV 75.4 (L) 80.0 - 98.0 fL    MCH 22.8 (L) 23.0 - 34.6 pg    MCHC 30.2 30.0 - 36.0 gm/dl    PLATELET 200 140 - 450 1000/mm3    MPV N/A 6.0 - 10.0 fL    RDW-SD 58.3 (H) 35.1 - 43.9      NRBC 0 0 - 0      IMMATURE GRANULOCYTES 1.2 0.0 - 3.0 %    NEUTROPHILS 89.1 (H) 34 - 64 %    LYMPHOCYTES 7.0 (L) 28 - 48 %    MONOCYTES 2.5 1 - 13 %    EOSINOPHILS 0.1 0 - 5 %    BASOPHILS 0.1 0 - 3 %   MAGNESIUM    Collection Time: 01/18/19  2:36 AM   Result Value Ref Range    Magnesium 2.7 (H) 1.6 - 2.6 mg/dl   METABOLIC PANEL, BASIC    Collection Time: 01/18/19  2:36 AM   Result Value Ref Range    Sodium 137 136 - 145 mEq/L    Potassium 5.0 3.5 - 5.1 mEq/L    Chloride 106 98 - 107 mEq/L    CO2 26 21 - 32 mEq/L  Glucose 181 (H) 74 - 106 mg/dl    BUN 44 (H) 7 - 25 mg/dl    Creatinine 1.5 (H) 0.6 - 1.3 mg/dl    GFR est AA 59.0      GFR est non-AA 48      Calcium 8.7 8.5 - 10.1 mg/dl    Anion gap 5 5 - 15 mmol/L         General:  Stress Ulcer Protocol Active: yes  DVT Protocol Active: yes       Genia Harold, MD   3:23 PM 01/18/2019        Dragon medical dictation software was used for portions of this report.  Unintended voice transcription errors may have occurred.

## 2019-01-18 NOTE — Progress Notes (Signed)
Medical Progress Note      NAME: Tommy Brown   DOB:  November 18, 1945  MRN:             [id number]    Date:  01/18/2019        Assessment:     1. Acute hypoxic respiratory failure  2. COVID-19 infection  3. Multifocal pneumonia  4. Pancytopenia  5. UTI with Klebsiella pneumoniae  6. Hypertension  7. CAD status post CABG  8. Gout  9. Pancreatic cancer  10. Neuropathy    Plan:     ?? Continue supplemental oxygen and titrate and wean him off as tolerated   ?? Currently is on oxygen 7 L   ?? Continue dexamethasone daily complete 10 days course of treatment  ?? Patient already received convalescent plasma and completed remdesivir  ?? Pulmonary and ID also following   ?? Discussed with the patient   ?? Discussed with and updated his wife    Subjective:     Shortness of breathing, denies fever    Objective:     Vitals:    Last 24hrs VS reviewed since prior progress note. Most recent are:  Visit Vitals  BP 123/76 (BP 1 Location: Right arm, BP Patient Position: Supine)   Pulse 61   Temp 97.4 ??F (36.3 ??C)   Resp 20   Wt 61.2 kg (135 lb)   SpO2 96%   BMI 20.53 kg/m??     SpO2 Readings from Last 6 Encounters:   01/18/19 96%   07/31/18 98%   07/17/18 99%   06/26/18 97%   01/21/18 96%    O2 Flow Rate (L/min): 6 l/min       Intake/Output Summary (Last 24 hours) at 01/18/2019 1147  Last data filed at 01/18/2019 [id number]  Gross per 24 hour   Intake 530 ml   Output 1425 ml   Net -895 ml        Exam:     General:   Not in acute distress  HEENT: PERRLA, Neck Supple,  No JVD  Respiratory:   CTA bilaterally-no wheezes, rales, rhonchi, or crackles  Cardiac:  Regular Rate and Rythmn  - no murmurs, rubs or gallops  Abdominal:  Soft, non-tender, non-distended, positive bowel sounds  Extremities:  No cyanosis, or edema.  Skin: No rash  Neurological:  No focal neurological deficits    Medication:   Current Medications Reviewed    Current Facility-Administered Medications:    ???  acetaminophen (TYLENOL) tablet 650 mg, 650 mg, Oral, ONCE, Morris, Derrek Gu, MD  ???  furosemide (LASIX) injection 20 mg, 20 mg, IntraVENous, BID, Stanton Kidney, MD, 20 mg at 01/18/19 0854  ???  levoFLOXacin (LEVAQUIN) tablet 750 mg, 750 mg, Oral, Q24H, Scott, Berlinda Last, NP, 750 mg at 01/18/19 1109  ???  dexAMETHasone (DECADRON) tablet 6 mg, 6 mg, Oral, DAILY WITH DINNER, Chaniece Barbato, Tami Ribas, MD, 6 mg at 01/17/19 1627  ???  ketotifen (ZADITOR) 0.025 % (0.035 %) ophthalmic solution 1 Drop, 1 Drop, Both Eyes, BID, Stanton Kidney, MD, 1 Drop at 01/18/19 0855  ???  promethazine (PHENERGAN) 25 mg in NS IVPB, 25 mg, IntraVENous, Q6H PRN, Stanton Kidney, MD, Last Rate: 200 mL/hr at 01/17/19 1637, 25 mg at 01/17/19 1637  ???  allopurinoL (ZYLOPRIM) tablet 300 mg, 300 mg, Oral, DAILY, Domnic Vantol, Tami Ribas, MD, 300 mg at 01/18/19 0854  ???  gabapentin (NEURONTIN) capsule 600 mg, 600 mg, Oral, BID, Tydus Sanmiguel, Tami Ribas, MD, 600  mg at 01/18/19 0854  ???  Saccharomyces boulardii (FLORASTOR) capsule 250 mg, 250 mg, Oral, ACB&D, Stanton Kidney, MD, 250 mg at 01/18/19 [id number]  ???  lipase-protease-amylase (CREON 24,000) capsule 1 Cap, 1 Cap, Oral, TID WITH MEALS, Stanton Kidney, MD, 1 Cap at 01/18/19 1109  ???  metoprolol succinate (TOPROL-XL) XL tablet 25 mg, 25 mg, Oral, DAILY, Stanton Kidney, MD, 25 mg at 01/18/19 0854  ???  polyvinyl alcohol-povidon(PF) (REFRESH CLASSIC) 1.4-0.6 % ophthalmic solution 2 Drop, 2 Drop, Both Eyes, DAILY, Mikai Meints, Tami Ribas, MD, 2 Drop at 01/18/19 0854  ???  tamsulosin (FLOMAX) capsule 0.8 mg, 0.8 mg, Oral, DAILY, Kortnee Bas, Tami Ribas, MD, 0.8 mg at 01/18/19 0854  ???  temazepam (RESTORIL) capsule 7.5 mg, 7.5 mg, Oral, QHS PRN, Stanton Kidney, MD, 7.5 mg at 01/18/19 0035  ???  naloxone (NARCAN) injection 0.1 mg, 0.1 mg, IntraVENous, PRN, Tollie Canada, Tami Ribas, MD  ???  ondansetron (ZOFRAN) injection 4 mg, 4 mg, IntraVENous, Q4H PRN, Delene Loll, Tami Ribas, MD   ???  enoxaparin (LOVENOX) injection 40 mg, 40 mg, SubCUTAneous, Q24H, Aarica Wax, Tami Ribas, MD, 40 mg at 01/17/19 1627  ???  amLODIPine (NORVASC) tablet 10 mg, 10 mg, Oral, DAILY, Maize Brittingham, Tami Ribas, MD, 10 mg at 01/18/19 0854  ???  hydrALAZINE (APRESOLINE) 20 mg/mL injection 10 mg, 10 mg, IntraVENous, Q6H PRN, Treasure Ingrum, Tami Ribas, MD  ???  0.9% sodium chloride infusion 250 mL, 250 mL, IntraVENous, PRN, Stanton Kidney, MD      Lab:     Lab Data Reviewed: (see below)  Recent Results (from the past 24 hour(s))   D DIMER    Collection Time: 01/18/19  2:36 AM   Result Value Ref Range    D DIMER 0.56 (H) 0.01 - 0.50 ug/mL (FEU)   FERRITIN    Collection Time: 01/18/19  2:36 AM   Result Value Ref Range    Ferritin 76.5 26.0 - 388.0 ng/ml   C REACTIVE PROTEIN, QT    Collection Time: 01/18/19  2:36 AM   Result Value Ref Range    C-Reactive protein 7.2 (H) 0.0 - 2.9 mg/L   CBC WITH AUTOMATED DIFF    Collection Time: 01/18/19  2:36 AM   Result Value Ref Range    WBC 6.8 4.0 - 11.0 1000/mm3    RBC 4.26 3.80 - 5.70 M/uL    HGB 9.7 (L) 12.4 - 17.2 gm/dl    HCT 32.1 (L) 37.0 - 50.0 %    MCV 75.4 (L) 80.0 - 98.0 fL    MCH 22.8 (L) 23.0 - 34.6 pg    MCHC 30.2 30.0 - 36.0 gm/dl    PLATELET 200 140 - 450 1000/mm3    MPV N/A 6.0 - 10.0 fL    RDW-SD 58.3 (H) 35.1 - 43.9      NRBC 0 0 - 0      IMMATURE GRANULOCYTES 1.2 0.0 - 3.0 %    NEUTROPHILS 89.1 (H) 34 - 64 %    LYMPHOCYTES 7.0 (L) 28 - 48 %    MONOCYTES 2.5 1 - 13 %    EOSINOPHILS 0.1 0 - 5 %    BASOPHILS 0.1 0 - 3 %   MAGNESIUM    Collection Time: 01/18/19  2:36 AM   Result Value Ref Range    Magnesium 2.7 (H) 1.6 - 2.6 mg/dl   METABOLIC PANEL, BASIC    Collection Time: 01/18/19  2:36 AM   Result Value Ref Range  Sodium 137 136 - 145 mEq/L    Potassium 5.0 3.5 - 5.1 mEq/L    Chloride 106 98 - 107 mEq/L    CO2 26 21 - 32 mEq/L    Glucose 181 (H) 74 - 106 mg/dl    BUN 44 (H) 7 - 25 mg/dl    Creatinine 1.5 (H) 0.6 - 1.3 mg/dl    GFR est AA 59.0      GFR est non-AA 48       Calcium 8.7 8.5 - 10.1 mg/dl    Anion gap 5 5 - 15 mmol/L       No results found.    Active Problems:    Pneumonia due to COVID-19 virus (01/10/2019)      ____________________________________________________________________      Attending Physician: Stanton Kidney, MD       Dragon medical dictation software was used for portions of this report. Unintended voice recognition errors may occur.

## 2019-01-18 NOTE — Progress Notes (Addendum)
INFECTIOUS DISEASE FOLLOW UP      Infectious Disease Attending    I have reviewed Tommy Brown's  chart. I saw and examined the patient, personally performed the key portions of the service and discussed the management with the treatment team. I reviewed the NP's note and concur with the history, findings and plan of care and my edits as needed. Laboratory, hemodynamic and radiological data were independently reviewed.     -Patient afebrile, on high flow nasal cannula but maintaining better saturations  -Patient completed remdesivir and plasma  -Plan 2 more days of steroids  -Plan 2 more days of Levaquin  -Monitor labs and inflammatory markers as needed  -Monitor for any worsening of respiratory status  -Nothing much to add from ID standpoint.  We will sign off but available as needed    [name], MD      NOTE   ABX:   ??  Current abx Prior abx??   Levaquin 11/20-3          Day 8 Strep Pneum rx  Decadron 11/15-8  Remdesivir 11/15-5-->complete  Convalescent plasma 11/15 ??Zithromax 11/15-5  Rocephin 11/15-5   ??  ASSESSMENT:??   ??  Pneumonia--Strep Pneumonia Urine ag +   Viral pneumonia--COVID-19 positive   UTI  UA unrevealing  Urine culture positive clubs and E. coli   History of left hydronephrosis due to renal calculi  06/22/18??JJ stent placement  07/31/2018 cystoscopy left retrograde pyelogram left ureteroscopy laser lithotripsy proximal ureteral stone and extraction of left renal calculi and exchange of double-J stents  08/06/2018 during stent removed   CAD s/p??CABG   Pancreatic cancer  2006 for procedure, radiation, chemo   Gout   Hypogonadism   ??   ??  ??  ??  RECOMMENDATIONS:    Tommy Brown is a 73 year old male with past medical history of hypertension, CAD, prostate cancer, hydronephrosis requiring stent was admitted for shortness of breath, positive COVID-19 and multifocal pneumonia  ??  -Afebrile, WBC normal  -Patient with multifocal pneumonia on chest x-ray  -Positive for COVID-19  -Patient also with positive strep pneumo antigen  -Was treated with 5 days of ceftriaxone, on Levaquin and has 2 remaining days to complete 10 days treatment for strep pneumonia  -Check sputum culture-->await collection  -Completed 5-day course IV Rocephin and Zithromax  -Pt s/p??convalescent plasma-  -Watch for any worsening  -Inflammatory markers  to need to trend down  -D-dimer 0.0.56  ??  -Patient's urine culture with Klebsiella and E. Coli--> treated with short course of ceftriaxone  -Monitor labs and cultures  -We will sign off but available  ??  COVID-19 Lab test monitoring:   Blood type: A Rh Negative  Pregnancy:  Quantiferon TB gold:  ??  ??  Labs:  No results for input(s): PTEMPI, O2ST, FIO2 in the last 72 hours.        Recent Labs     01/15/19  0524 01/14/19  0555 01/13/19  0639   WBC 6.9 7.1 6.8   CREA 1.2 1.2 1.4*   PLT 191 172 169   AST  --  139* 122*   ALT  --  108* 71   ??        Recent Labs     01/15/19  0524 01/14/19  0555 01/13/19  0948   CRP 17.4* 21.9* 29.6*   FERR 126.0 153.6 185.3   DDIME 0.80* 0.67* 0.70*   ??  ??  ??  ????????????  MICROBIOLOGY:  11/15 ????BC x2 NGTD  ????????????????????????UC Klebsiella pneumonia and E. Coli  ????????????????????????Strep pneumonia urine antigen positive  ????????????????????????Legionella urine antigen negative  ????????????????????????19+  ??  LINES AND CATHETERS:   PIV            SUBJECTIVE :     Interval notes reviewed.  Patient weaning down on high flow oxygen, currently on 6 L O2 sat 96%  Patient would like to go home  Denies shortness of breath cough  Fever chills or sweats  Headaches or dizziness  No chest pain  No diarrhea  Denies dysuria  All other systems negative    OBJECTIVE     Visit Vitals   BP 123/76 (BP 1 Location: Right arm, BP Patient Position: Supine)   Pulse 61   Temp 97.4 ??F (36.3 ??C)   Resp 20   Wt 61.2 kg (135 lb)   SpO2 96%   BMI 20.53 kg/m??       Temp (24hrs), Avg:97.9 ??F (36.6 ??C), Min:97.4 ??F (36.3 ??C), Max:98.2 ??F (36.8 ??C)    General:??No apparent distress, vitals noted  HEENT: Normocephalic,??mucous membranes pink and moist anicteric. ??  Neck: Supple, no lymphadenopathy, masses or thyromegaly  Chest: Symmetrical expansion  Lungs:???? Breath sounds auscultated all lung fields he is clear bilaterally.    Now on 6 L high flow   heart:??S1-S2 were distinct and regular grade 2/6 systolic murmur  Abdomen: Soft,??tender with active bowel sounds  Musculoskeletal: Normal strength/tone. No edema. No clubbing or cyanosis  CNS:??Awake and alert, oriented x3. ??Smile symmetric tongue midline  SKIN: No??rash no jaundice        MEDICATIONS:     Current Facility-Administered Medications   Medication Dose Route Frequency   ??? acetaminophen (TYLENOL) tablet 650 mg  650 mg Oral ONCE   ??? furosemide (LASIX) injection 20 mg  20 mg IntraVENous BID   ??? levoFLOXacin (LEVAQUIN) tablet 750 mg  750 mg Oral Q24H   ??? dexAMETHasone (DECADRON) tablet 6 mg  6 mg Oral DAILY WITH DINNER   ??? ketotifen (ZADITOR) 0.025 % (0.035 %) ophthalmic solution 1 Drop  1 Drop Both Eyes BID   ??? promethazine (PHENERGAN) 25 mg in NS IVPB  25 mg IntraVENous Q6H PRN   ??? allopurinoL (ZYLOPRIM) tablet 300 mg  300 mg Oral DAILY   ??? gabapentin (NEURONTIN) capsule 600 mg  600 mg Oral BID   ??? Saccharomyces boulardii (FLORASTOR) capsule 250 mg  250 mg Oral ACB&D   ??? lipase-protease-amylase (CREON 24,000) capsule 1 Cap  1 Cap Oral TID WITH MEALS   ??? metoprolol succinate (TOPROL-XL) XL tablet 25 mg  25 mg Oral DAILY   ??? polyvinyl alcohol-povidon(PF) (REFRESH CLASSIC) 1.4-0.6 % ophthalmic solution 2 Drop  2 Drop Both Eyes DAILY   ??? tamsulosin (FLOMAX) capsule 0.8 mg  0.8 mg Oral DAILY   ??? temazepam (RESTORIL) capsule 7.5 mg  7.5 mg Oral QHS PRN    ??? naloxone (NARCAN) injection 0.1 mg  0.1 mg IntraVENous PRN   ??? ondansetron (ZOFRAN) injection 4 mg  4 mg IntraVENous Q4H PRN   ??? enoxaparin (LOVENOX) injection 40 mg  40 mg SubCUTAneous Q24H   ??? amLODIPine (NORVASC) tablet 10 mg  10 mg Oral DAILY   ??? hydrALAZINE (APRESOLINE) 20 mg/mL injection 10 mg  10 mg IntraVENous Q6H PRN   ??? 0.9% sodium chloride infusion 250 mL  250 mL IntraVENous PRN       Labs: Results:   Chemistry Recent Labs  01/18/19  0236 01/17/19  0320 01/16/19  0409   GLU 181* 165* 181*   NA 137 140 138   K 5.0 5.1 4.1   CL 106 106 104   CO2 26 30 25    BUN 44* 44* 40*   CREA 1.5* 1.5* 1.5*   CA 8.7 8.6 9.0   AGAP 5 4* 8   AP  --  100  --    TP  --  6.8  --    ALB  --  2.5*  --       CBC w/Diff Recent Labs     01/18/19  0236 01/17/19  0320 01/16/19  0409   WBC 6.8 7.9 7.2   RBC 4.26 4.42 4.33   HGB 9.7* 9.9* 9.7*   HCT 32.1* 33.2* 32.3*   PLT 200 201 216   GRANS 89.1* 92.3* 87.1*   LYMPH 7.0* 6.7* 8.9*   EOS 0.1  --  0.0          RADIOLOGY :        Imaging  Results from Hospital Encounter encounter on 01/10/19   XR CHEST SNGL V    Narrative INDICATION: hypoxia, multifocal pneumonia, covid-19 positive. Dyspnea  COMPARISON: 01/10/2019  TECHNIQUE: Single view chest.      Impression IMPRESSION:  The patient is status post sternotomy. Heart is mildly enlarged. Mild vascular  congestion. There are bilateral infiltrates in the mid to lower lung zones,  increased since prior radiograph.         Results from [facility] encounter on 06/22/18   CT ABD PELV WO CONT    Narrative CT Abdomen and Pelvis without    Indication: Left hydronephrosis. Evaluate for obstructing stone or lesion.    Comparison: Ultrasound 06/22/2018. CT 05/21/2018.    TECHNIQUE:   CT of the abdomen and pelvis WITHOUT intravenous contrast. Coronal and sagittal  reformations were obtained.        All CT exams at this facility use one or more dose reduction techniques   including automatic exposure control, mA/kV adjustment per patient's size, or  iterative reconstruction technique. DICOM format imaged data is available to  non-affiliated external healthcare facilities or entities on a secure, media  free, reciprocal searchable basis with patient authorization for 12 months  following the date of the study.    DISCUSSION:    ABSENCE OF INTRAVENOUS CONTRAST DECREASES SENSITIVITY FOR DETECTION OF FOCAL  LESIONS AND VASCULAR PATHOLOGY.    LOWER THORAX: Coronary artery calcifications.    HEPATOBILIARY: Multiple subcentimeter foci of hypoattenuation, too small to  characterize but unchanged. Cholecystectomy. Left intrahepatic pneumobilia,  unchanged.  SPLEEN: No splenomegaly.  PANCREAS: Postoperative changes from prior Whipple procedure. No mass or duct  dilatation in the residual pancreatic tail.    ADRENALS: No adrenal nodules.  KIDNEYS/URETERS: Multiple bilateral renal cysts. Multiple small (2 to 4 mm)  nonobstructing bilateral renal calculi. There is a 10 x 9 mm nonobstructing  calculus in the lower pole of the left kidney. Moderate left hydronephrosis and  hydroureter. There is a 8 x 7 mm obstructing calculus in the proximal left  ureter. No right hydronephrosis. No suspicious renal lesions.  PELVIC ORGANS/BLADDER: Enlarged prostate. Penile implant. The bladder is  unremarkable.    PERITONEUM / RETROPERITONEUM: No free air or fluid.  LYMPH NODES: No lymphadenopathy.  VESSELS: Extensive atherosclerotic vascular calcifications. Infrarenal abdominal  aorta ectasia.    GI TRACT: No distention or wall thickening. Diverticulosis. Normal appendix.  BONES AND SOFT TISSUES: Posterior lumbar fixation at L4-5. Mild retrolisthesis  at L2-3 and L3-4. Moderate to advanced disc height loss at L2-3. Mild  spondylosis of the remaining lumbar spine.      Impression IMPRESSION:    1.  Obstructing 8 mm calculus in the left proximal ureter causing moderate left  hydronephrosis.   2.  Multiple additional nonobstructing bilateral renal calculi.  3.  Multiple bilateral renal cysts.  4.  Diverticulosis.  5.  Postoperative changes from prior Whipple procedure.  6.  Multiple hypoattenuating liver lesions, unchanged.       I have independently examined the patient and reviewed lab studies and imgaing as well as review of nursing notes and physican notes from the past 24 hours.    Dragon medical dictation software was used for portions of this report. Unintended errors may occur.     Melissa Montane, NP  January 18, 2019    [facility] Infectious Disease   Office [place]  [phone]

## 2019-01-18 NOTE — Progress Notes (Signed)
Pt is on 7L [place] HFNC sat 93-94%. Rt decreased to 6L [place], sat decreased to 90%, increased back to 7L [place]. Will continue to monitor and wean as tolerated.

## 2019-01-18 NOTE — Other (Signed)
Verbal shift change report given to Landis Martins (oncoming nurse) by Lanny Cramp (offgoing nurse). Report included the following information SBAR and Kardex.

## 2019-01-18 NOTE — Progress Notes (Signed)
Problem: Gas Exchange - Impaired  Goal: *Absence of hypoxia  Outcome: Progressing Towards Goal     Problem: Patient Education: Go to Patient Education Activity  Goal: Patient/Family Education  Outcome: Progressing Towards Goal

## 2019-01-18 NOTE — Other (Addendum)
PAGER ID: [phone]   MESSAGE: [id number]-Gammon-complaining of R hip/flank pain. Has no pain med ordered. Takes PO Toradol at home. Can he have something? Abigail Butts [phone]    Order for Tylenol obtained.  Pt stated pain was gone when brought med.  Med held.

## 2019-01-18 NOTE — Other (Signed)
Bedside and Verbal shift change report given to Danica T Sahagun RN   (oncoming nurse) by Kassandra RN (offgoing nurse). Report included the following information SBAR, Kardex and MAR.

## 2019-01-18 NOTE — Progress Notes (Signed)
Problem: Gas Exchange - Impaired  Goal: *Absence of hypoxia  Outcome: Progressing Towards Goal     Problem: Patient Education: Go to Patient Education Activity  Goal: Patient/Family Education  Outcome: Progressing Towards Goal

## 2019-01-18 NOTE — Progress Notes (Signed)
Progress  Notes by [name], MD at 01/18/19 [phone]                Author: [name], MD  Service: Infectious Disease  Author Type: Physician       Filed: 01/18/19 1306  Date of Service: 01/18/19 0841  Status: Addendum          Editor: [name], MD (Physician)          Related Notes: Original Note by Melissa Montane, NP (Nurse Practitioner) filed at 01/18/19  1206                                                                                                                                                            INFECTIOUS DISEASE FOLLOW UP           Infectious Disease Attending      I have reviewed Tommy Brown's  chart. I saw and examined the patient, personally performed the key portions of the service and discussed  the management with the treatment team. I reviewed the NP's note and concur with the history, findings and plan of care and my edits as needed. Laboratory, hemodynamic and radiological data were independently reviewed.       -Patient afebrile, on high flow nasal cannula but maintaining better saturations   -Patient completed remdesivir and plasma   -Plan 2 more days of steroids   -Plan 2 more days of Levaquin   -Monitor labs and inflammatory markers as needed   -Monitor for any worsening of respiratory status   -Nothing much to add from ID standpoint.  We will sign off but available as needed      [name], MD         NOTE      ABX:     ??      Current abx  Prior abx??     Levaquin 11/20-3          Day 8 Strep Pneum rx   Decadron 11/15-8   Remdesivir 11/15-5-->complete   Convalescent plasma 11/15  ??Zithromax 11/15-5   Rocephin 11/15-5     ??     ASSESSMENT:??     ??     Pneumonia--Strep Pneumonia Urine ag +     Viral pneumonia--COVID-19 positive     UTI   UA unrevealing   Urine culture positive clubs and E. coli     History of left hydronephrosis due to renal calculi   06/22/18??JJ stent placement   07/31/2018 cystoscopy left retrograde pyelogram left ureteroscopy laser lithotripsy  proximal ureteral stone and extraction of left renal calculi and exchange of double-J stents   08/06/2018 during stent removed     CAD s/p??CABG     Pancreatic cancer   2006 for procedure, radiation,  chemo     Gout     Hypogonadism     ??     ??   ??   ??     RECOMMENDATIONS:     Tommy Brown is a 73 year old male with past medical history of hypertension, CAD, prostate cancer, hydronephrosis requiring stent was admitted for shortness  of breath, positive COVID-19 and multifocal pneumonia   ??   -Afebrile, WBC normal   -Patient with multifocal pneumonia on chest x-ray   -Positive for COVID-19   -Patient also with positive strep pneumo antigen   -Was treated with 5 days of ceftriaxone, on Levaquin and has 2 remaining days to complete 10 days treatment for strep pneumonia   -Check sputum culture-->await collection   -Completed 5-day course IV Rocephin and Zithromax   -Pt s/p??convalescent plasma-   -Watch for any worsening   -Inflammatory markers  to need to trend down   -D-dimer 0.0.56   ??   -Patient's urine culture with Klebsiella and E. Coli--> treated with short course of ceftriaxone   -Monitor labs and cultures   -We will sign off but available   ??     COVID-19 Lab test monitoring:     Blood type: A Rh Negative   Pregnancy:   Quantiferon TB gold:   ??   ??   Labs:   No results for input(s): PTEMPI, O2ST, FIO2 in the last 72 hours.                     Recent Labs             01/15/19   0524  01/14/19   0555  01/13/19   0639     WBC  6.9  7.1  6.8     CREA  1.2  1.2  1.4*     PLT  191  172  169     AST   --   139*  122*     ALT   --   108*  71     ??                     Recent Labs             01/15/19   0524  01/14/19   0555  01/13/19   0948     CRP  17.4*  21.9*  29.6*     FERR  126.0  153.6  185.3     DDIME  0.80*  0.67*  0.70*     ??   ??   ??   ????????????     MICROBIOLOGY:     11/15 ????BC x2 NGTD   ????????????????????????UC Klebsiella pneumonia and E. Coli   ????????????????????????Strep pneumonia urine antigen positive   ????????????????????????Legionella urine  antigen negative   ????????????????????????19+   ??     LINES AND CATHETERS:     PIV                    SUBJECTIVE :        Interval notes reviewed.  Patient weaning down on high flow oxygen, currently on 6 L O2 sat 96%   Patient would like to go home   Denies shortness of breath cough   Fever chills or sweats   Headaches or dizziness   No chest pain   No diarrhea   Denies dysuria   All other systems negative  OBJECTIVE        Visit Vitals      BP  123/76 (BP 1 Location: Right arm, BP Patient Position: Supine)     Pulse  61     Temp  97.4 ??F (36.3 ??C)     Resp  20     Wt  61.2 kg (135 lb)     SpO2  96%        BMI  20.53 kg/m??           Temp (24hrs), Avg:97.9 ??F (36.6 ??C), Min:97.4 ??F (36.3 ??C), Max:98.2 ??F (36.8 ??C)      General:??No apparent distress, vitals noted   HEENT: Normocephalic,??mucous membranes pink and moist anicteric. ??   Neck: Supple, no lymphadenopathy, masses or thyromegaly   Chest: Symmetrical expansion   Lungs:???? Breath sounds auscultated all lung fields he is clear bilaterally.    Now on 6 L high flow    heart:??S1-S2 were distinct and regular grade 2/6 systolic murmur   Abdomen: Soft,??tender with active bowel sounds   Musculoskeletal: Normal strength/tone. No edema. No clubbing or cyanosis   CNS:??Awake and alert, oriented x3. ??Smile symmetric tongue midline   SKIN: No??rash no jaundice              MEDICATIONS:          Current Facility-Administered Medications          Medication  Dose  Route  Frequency           ?  acetaminophen (TYLENOL) tablet 650 mg   650 mg  Oral  ONCE     ?  furosemide (LASIX) injection 20 mg   20 mg  IntraVENous  BID     ?  levoFLOXacin (LEVAQUIN) tablet 750 mg   750 mg  Oral  Q24H     ?  dexAMETHasone (DECADRON) tablet 6 mg   6 mg  Oral  DAILY WITH DINNER     ?  ketotifen (ZADITOR) 0.025 % (0.035 %) ophthalmic solution 1 Drop   1 Drop  Both Eyes  BID     ?  promethazine (PHENERGAN) 25 mg in NS IVPB   25 mg  IntraVENous  Q6H PRN     ?  allopurinoL (ZYLOPRIM) tablet 300 mg   300 mg   Oral  DAILY     ?  gabapentin (NEURONTIN) capsule 600 mg   600 mg  Oral  BID     ?  Saccharomyces boulardii (FLORASTOR) capsule 250 mg   250 mg  Oral  ACB&D     ?  lipase-protease-amylase (CREON 24,000) capsule 1 Cap   1 Cap  Oral  TID WITH MEALS     ?  metoprolol succinate (TOPROL-XL) XL tablet 25 mg   25 mg  Oral  DAILY     ?  polyvinyl alcohol-povidon(PF) (REFRESH CLASSIC) 1.4-0.6 % ophthalmic solution 2 Drop   2 Drop  Both Eyes  DAILY     ?  tamsulosin (FLOMAX) capsule 0.8 mg   0.8 mg  Oral  DAILY     ?  temazepam (RESTORIL) capsule 7.5 mg   7.5 mg  Oral  QHS PRN           ?  naloxone (NARCAN) injection 0.1 mg   0.1 mg  IntraVENous  PRN           ?  ondansetron (ZOFRAN) injection 4 mg   4 mg  IntraVENous  Q4H PRN     ?  enoxaparin (LOVENOX) injection 40 mg   40 mg  SubCUTAneous  Q24H     ?  amLODIPine (NORVASC) tablet 10 mg   10 mg  Oral  DAILY     ?  hydrALAZINE (APRESOLINE) 20 mg/mL injection 10 mg   10 mg  IntraVENous  Q6H PRN           ?  0.9% sodium chloride infusion 250 mL   250 mL  IntraVENous  PRN              Labs:  Results:        Chemistry  Recent Labs         01/18/19   0236  01/17/19   0320  01/16/19   0409      GLU  181*  165*  181*      NA  137  140  138      K  5.0  5.1  4.1      CL  106  106  104      CO2  26  30  25       BUN  44*  44*  40*      CREA  1.5*  1.5*  1.5*      CA  8.7  8.6  9.0      AGAP  5  4*  8      AP   --   100   --       TP   --   6.8   --       ALB   --   2.5*   --                  CBC w/Diff  Recent Labs         01/18/19   0236  01/17/19   0320  01/16/19   0409      WBC  6.8  7.9  7.2      RBC  4.26  4.42  4.33      HGB  9.7*  9.9*  9.7*      HCT  32.1*  33.2*  32.3*      PLT  200  201  216      GRANS  89.1*  92.3*  87.1*      LYMPH  7.0*  6.7*  8.9*      EOS  0.1   --   0.0                      RADIOLOGY :            Imaging     Results from Hospital Encounter encounter on 01/10/19     XR CHEST SNGL V           Narrative  INDICATION: hypoxia, multifocal pneumonia, covid-19  positive. Dyspnea   COMPARISON: 01/10/2019   TECHNIQUE: Single view chest.              Impression  IMPRESSION:   The patient is status post sternotomy. Heart is mildly enlarged. Mild vascular   congestion. There are bilateral infiltrates in the mid to lower lung zones,   increased since prior radiograph.                Results from [facility] encounter on 06/22/18     CT ABD PELV WO CONT  Narrative  CT Abdomen and Pelvis without      Indication: Left hydronephrosis. Evaluate for obstructing stone or lesion.      Comparison: Ultrasound 06/22/2018. CT 05/21/2018.      TECHNIQUE:    CT of the abdomen and pelvis WITHOUT intravenous contrast. Coronal and sagittal   reformations were obtained.          All CT exams at this facility use one or more dose reduction techniques   including automatic exposure control, mA/kV adjustment per patient's size, or   iterative reconstruction technique. DICOM format imaged data is available to   non-affiliated external healthcare facilities or entities on a secure, media   free, reciprocal searchable basis with patient authorization for 12 months   following the date of the study.      DISCUSSION:      ABSENCE OF INTRAVENOUS CONTRAST DECREASES SENSITIVITY FOR DETECTION OF FOCAL   LESIONS AND VASCULAR PATHOLOGY.      LOWER THORAX: Coronary artery calcifications.      HEPATOBILIARY: Multiple subcentimeter foci of hypoattenuation, too small to   characterize but unchanged. Cholecystectomy. Left intrahepatic pneumobilia,   unchanged.   SPLEEN: No splenomegaly.   PANCREAS: Postoperative changes from prior Whipple procedure. No mass or duct   dilatation in the residual pancreatic tail.      ADRENALS: No adrenal nodules.   KIDNEYS/URETERS: Multiple bilateral renal cysts. Multiple small (2 to 4 mm)   nonobstructing bilateral renal calculi. There is a 10 x 9 mm nonobstructing   calculus in the lower pole of the left kidney. Moderate left hydronephrosis and   hydroureter. There is  a 8 x 7 mm obstructing calculus in the proximal left   ureter. No right hydronephrosis. No suspicious renal lesions.   PELVIC ORGANS/BLADDER: Enlarged prostate. Penile implant. The bladder is   unremarkable.      PERITONEUM / RETROPERITONEUM: No free air or fluid.   LYMPH NODES: No lymphadenopathy.   VESSELS: Extensive atherosclerotic vascular calcifications. Infrarenal abdominal   aorta ectasia.      GI TRACT: No distention or wall thickening. Diverticulosis. Normal appendix.      BONES AND SOFT TISSUES: Posterior lumbar fixation at L4-5. Mild retrolisthesis   at L2-3 and L3-4. Moderate to advanced disc height loss at L2-3. Mild   spondylosis of the remaining lumbar spine.              Impression  IMPRESSION:      1.  Obstructing 8 mm calculus in the left proximal ureter causing moderate left   hydronephrosis.   2.  Multiple additional nonobstructing bilateral renal calculi.   3.  Multiple bilateral renal cysts.   4.  Diverticulosis.   5.  Postoperative changes from prior Whipple procedure.   6.  Multiple hypoattenuating liver lesions, unchanged.           I have independently examined the patient and reviewed lab studies and imgaing as well as review of nursing notes and physican notes from the past 24 hours.      Dragon medical dictation software was used for portions of this report. Unintended errors may occur.       Melissa Montane, NP   January 18, 2019      [facility] Infectious Disease    Office [place]   [phone]

## 2019-01-18 NOTE — Progress Notes (Signed)
Medical Progress Note      NAME: Tommy Brown   DOB:  [birth year]  MRN:             [id number]    Date:  01/18/2019        Assessment:     1. Acute hypoxic respiratory failure  2. COVID-19 infection  3. Multifocal pneumonia  4. Pancytopenia  5. UTI with Klebsiella pneumoniae  6. Hypertension  7. CAD status post CABG  8. Gout  9. Pancreatic cancer  10. Neuropathy    Plan:     ?? Continue supplemental oxygen and titrate and wean him off as tolerated   ?? Currently is on oxygen 7 L   ?? Continue dexamethasone daily complete 10 days course of treatment  ?? Patient already received convalescent plasma and completed remdesivir  ?? Pulmonary and ID also following   ?? Discussed with the patient   ?? Discussed with and updated his wife    Subjective:     Shortness of breathing, denies fever    Objective:     Vitals:    Last 24hrs VS reviewed since prior progress note. Most recent are:  Visit Vitals  BP 123/76 (BP 1 Location: Right arm, BP Patient Position: Supine)   Pulse 61   Temp 97.4 ??F (36.3 ??C)   Resp 20   Wt 61.2 kg (135 lb)   SpO2 96%   BMI 20.53 kg/m??     SpO2 Readings from Last 6 Encounters:   01/18/19 96%   07/31/18 98%   07/17/18 99%   06/26/18 97%   01/21/18 96%    O2 Flow Rate (L/min): 6 l/min       Intake/Output Summary (Last 24 hours) at 01/18/2019 1147  Last data filed at 01/18/2019 [id number]  Gross per 24 hour   Intake 530 ml   Output 1425 ml   Net -895 ml        Exam:     General:   Not in acute distress  HEENT: PERRLA, Neck Supple,  No JVD  Respiratory:   CTA bilaterally-no wheezes, rales, rhonchi, or crackles  Cardiac:  Regular Rate and Rythmn  - no murmurs, rubs or gallops  Abdominal:  Soft, non-tender, non-distended, positive bowel sounds  Extremities:  No cyanosis, or edema.  Skin: No rash  Neurological:  No focal neurological deficits    Medication:   Current Medications Reviewed    Current Facility-Administered Medications:   ???  acetaminophen (TYLENOL) tablet 650 mg, 650 mg, Oral, ONCE,  Morris, Derrek Gu, MD  ???  furosemide (LASIX) injection 20 mg, 20 mg, IntraVENous, BID, Stanton Kidney, MD, 20 mg at 01/18/19 0854  ???  levoFLOXacin (LEVAQUIN) tablet 750 mg, 750 mg, Oral, Q24H, Scott, Berlinda Last, NP, 750 mg at 01/18/19 1109  ???  dexAMETHasone (DECADRON) tablet 6 mg, 6 mg, Oral, DAILY WITH DINNER, Jennel Mara, Tami Ribas, MD, 6 mg at 01/17/19 1627  ???  ketotifen (ZADITOR) 0.025 % (0.035 %) ophthalmic solution 1 Drop, 1 Drop, Both Eyes, BID, Stanton Kidney, MD, 1 Drop at 01/18/19 0855  ???  promethazine (PHENERGAN) 25 mg in NS IVPB, 25 mg, IntraVENous, Q6H PRN, Stanton Kidney, MD, Last Rate: 200 mL/hr at 01/17/19 1637, 25 mg at 01/17/19 1637  ???  allopurinoL (ZYLOPRIM) tablet 300 mg, 300 mg, Oral, DAILY, Maksym Pfiffner, Tami Ribas, MD, 300 mg at 01/18/19 0854  ???  gabapentin (NEURONTIN) capsule 600 mg, 600 mg, Oral, BID, Dayna Alia, Tami Ribas, MD, 600  mg at 01/18/19 0854  ???  Saccharomyces boulardii (FLORASTOR) capsule 250 mg, 250 mg, Oral, ACB&D, Stanton Kidney, MD, 250 mg at 01/18/19 [id number]  ???  lipase-protease-amylase (CREON 24,000) capsule 1 Cap, 1 Cap, Oral, TID WITH MEALS, Stanton Kidney, MD, 1 Cap at 01/18/19 1109  ???  metoprolol succinate (TOPROL-XL) XL tablet 25 mg, 25 mg, Oral, DAILY, Stanton Kidney, MD, 25 mg at 01/18/19 0854  ???  polyvinyl alcohol-povidon(PF) (REFRESH CLASSIC) 1.4-0.6 % ophthalmic solution 2 Drop, 2 Drop, Both Eyes, DAILY, Cortlynn Hollinsworth, Tami Ribas, MD, 2 Drop at 01/18/19 0854  ???  tamsulosin (FLOMAX) capsule 0.8 mg, 0.8 mg, Oral, DAILY, Sang Blount, Tami Ribas, MD, 0.8 mg at 01/18/19 0854  ???  temazepam (RESTORIL) capsule 7.5 mg, 7.5 mg, Oral, QHS PRN, Stanton Kidney, MD, 7.5 mg at 01/18/19 0035  ???  naloxone (NARCAN) injection 0.1 mg, 0.1 mg, IntraVENous, PRN, Josie Mesa, Tami Ribas, MD  ???  ondansetron (ZOFRAN) injection 4 mg, 4 mg, IntraVENous, Q4H PRN, Delene Loll, Tami Ribas, MD  ???  enoxaparin (LOVENOX) injection 40 mg, 40 mg, SubCUTAneous, Q24H, Maisy Newport, Tami Ribas, MD, 40 mg at 01/17/19 1627  ???  amLODIPine (NORVASC) tablet 10 mg, 10 mg,  Oral, DAILY, Chey Rachels, Tami Ribas, MD, 10 mg at 01/18/19 0854  ???  hydrALAZINE (APRESOLINE) 20 mg/mL injection 10 mg, 10 mg, IntraVENous, Q6H PRN, Taneika Choi, Tami Ribas, MD  ???  0.9% sodium chloride infusion 250 mL, 250 mL, IntraVENous, PRN, Stanton Kidney, MD      Lab:     Lab Data Reviewed: (see below)  Recent Results (from the past 24 hour(s))   D DIMER    Collection Time: 01/18/19  2:36 AM   Result Value Ref Range    D DIMER 0.56 (H) 0.01 - 0.50 ug/mL (FEU)   FERRITIN    Collection Time: 01/18/19  2:36 AM   Result Value Ref Range    Ferritin 76.5 26.0 - 388.0 ng/ml   C REACTIVE PROTEIN, QT    Collection Time: 01/18/19  2:36 AM   Result Value Ref Range    C-Reactive protein 7.2 (H) 0.0 - 2.9 mg/L   CBC WITH AUTOMATED DIFF    Collection Time: 01/18/19  2:36 AM   Result Value Ref Range    WBC 6.8 4.0 - 11.0 1000/mm3    RBC 4.26 3.80 - 5.70 M/uL    HGB 9.7 (L) 12.4 - 17.2 gm/dl    HCT 32.1 (L) 37.0 - 50.0 %    MCV 75.4 (L) 80.0 - 98.0 fL    MCH 22.8 (L) 23.0 - 34.6 pg    MCHC 30.2 30.0 - 36.0 gm/dl    PLATELET 200 140 - 450 1000/mm3    MPV N/A 6.0 - 10.0 fL    RDW-SD 58.3 (H) 35.1 - 43.9      NRBC 0 0 - 0      IMMATURE GRANULOCYTES 1.2 0.0 - 3.0 %    NEUTROPHILS 89.1 (H) 34 - 64 %    LYMPHOCYTES 7.0 (L) 28 - 48 %    MONOCYTES 2.5 1 - 13 %    EOSINOPHILS 0.1 0 - 5 %    BASOPHILS 0.1 0 - 3 %   MAGNESIUM    Collection Time: 01/18/19  2:36 AM   Result Value Ref Range    Magnesium 2.7 (H) 1.6 - 2.6 mg/dl   METABOLIC PANEL, BASIC    Collection Time: 01/18/19  2:36 AM   Result Value Ref Range  Sodium 137 136 - 145 mEq/L    Potassium 5.0 3.5 - 5.1 mEq/L    Chloride 106 98 - 107 mEq/L    CO2 26 21 - 32 mEq/L    Glucose 181 (H) 74 - 106 mg/dl    BUN 44 (H) 7 - 25 mg/dl    Creatinine 1.5 (H) 0.6 - 1.3 mg/dl    GFR est AA 59.0      GFR est non-AA 48      Calcium 8.7 8.5 - 10.1 mg/dl    Anion gap 5 5 - 15 mmol/L       No results found.    Active Problems:    Pneumonia due to COVID-19 virus  (01/10/2019)      ____________________________________________________________________      Attending Physician: Stanton Kidney, MD       Dragon medical dictation software was used for portions of this report. Unintended voice recognition errors may occur.

## 2019-01-18 NOTE — Progress Notes (Signed)
Pulmonary / Critical Care Progress Note:  II  Summary:   Patient without prior medical history of pulmonary disease.  Non-smoker.  Admitted with COVID-19 pneumonitis.  Followed by ID also found to have positive urine antigen for strep pneumonia.  Received dexamethasone and remdesivir and convalescent plasma.   Interval History / ROS:    Doing better.  Decreased oxygen requirements now down to 6 L.  Discussed with RN and RT.  Working on his ICS and self proning.        IMPRESSION:   ?? Acute hypoxemic respiratory failure due to COVID-19 pneumonitis  ?? COVID-19 pneumonitis  ?? Streptococcal pneumoniae pneumonia, positive strep pneumo urine antigen  ?? Urine culture growing Klebsiella  and E-coli sensitive to cephalosporin  ?? Hypertension  ?? BPH  ?? History of pancreatic cancer  ?? History of left hydronephrosis and stent placement    ??   PLAN:   ?? Continue supplemental oxygen with aggressive weaning.  He is down to 6 L now.  ?? Educated patient to continue using his incentive spirometer.  ?? Continue proning and deep breathing exercise.  ?? Keep even to slightly negative.  ?? follow IN/OUT. Follow renal function   ?? Antibiotics per ID.  Monitor inflammatory markers-slow improvement. ??Monitor D-dimer level-relatively stable.  ?? Continue subcu Lovenox for DVT prophylaxis  ?? We will follow chart and follow intermittently.  ?? Further evaluation and management to follow depending on clinical progression and / or more available data.   ?? Further management of other conditions per primary team and others involved.  ??    ??    Assessment:   Patient Active Problem List    Diagnosis Date Noted   ??? Pneumonia due to COVID-19 virus 01/10/2019   ??? UTI (urinary tract infection) 07/15/2018   ??? AKI (acute kidney injury) ([place]) 06/22/2018   ??? Sepsis ([place]) 01/17/2018         Current Pertinent Medications:   Current Facility-Administered Medications   Medication Dose Route Frequency   ??? acetaminophen (TYLENOL) tablet 650 mg  650 mg Oral ONCE   ???  furosemide (LASIX) injection 20 mg  20 mg IntraVENous BID   ??? levoFLOXacin (LEVAQUIN) tablet 750 mg  750 mg Oral Q24H   ??? dexAMETHasone (DECADRON) tablet 6 mg  6 mg Oral DAILY WITH DINNER   ??? ketotifen (ZADITOR) 0.025 % (0.035 %) ophthalmic solution 1 Drop  1 Drop Both Eyes BID   ??? promethazine (PHENERGAN) 25 mg in NS IVPB  25 mg IntraVENous Q6H PRN   ??? allopurinoL (ZYLOPRIM) tablet 300 mg  300 mg Oral DAILY   ??? gabapentin (NEURONTIN) capsule 600 mg  600 mg Oral BID   ??? Saccharomyces boulardii (FLORASTOR) capsule 250 mg  250 mg Oral ACB&D   ??? lipase-protease-amylase (CREON 24,000) capsule 1 Cap  1 Cap Oral TID WITH MEALS   ??? metoprolol succinate (TOPROL-XL) XL tablet 25 mg  25 mg Oral DAILY   ??? polyvinyl alcohol-povidon(PF) (REFRESH CLASSIC) 1.4-0.6 % ophthalmic solution 2 Drop  2 Drop Both Eyes DAILY   ??? tamsulosin (FLOMAX) capsule 0.8 mg  0.8 mg Oral DAILY   ??? temazepam (RESTORIL) capsule 7.5 mg  7.5 mg Oral QHS PRN   ??? naloxone (NARCAN) injection 0.1 mg  0.1 mg IntraVENous PRN   ??? ondansetron (ZOFRAN) injection 4 mg  4 mg IntraVENous Q4H PRN   ??? enoxaparin (LOVENOX) injection 40 mg  40 mg SubCUTAneous Q24H   ??? amLODIPine (NORVASC) tablet 10 mg  10 mg Oral DAILY   ??? hydrALAZINE (APRESOLINE) 20 mg/mL injection 10 mg  10 mg IntraVENous Q6H PRN   ??? 0.9% sodium chloride infusion 250 mL  250 mL IntraVENous PRN       Objective:   Visit Vitals  BP 107/63 (BP 1 Location: Right arm, BP Patient Position: Supine)   Pulse 87   Temp 98.4 ??F (36.9 ??C)   Resp 20   Wt 61.2 kg (135 lb)   SpO2 96%   BMI 20.53 kg/m??       Intake/Output Summary (Last 24 hours) at 01/18/2019 1523  Last data filed at 01/18/2019 1227  Gross per 24 hour   Intake 530 ml   Output 1050 ml   Net -520 ml       In no   apparent distress.   Is  not using accessory muscles of breathing.   Oral cavity intact  Neck is supple.  No significant JVD.   Lungs fairly clear except for basilar ronchi.  No wheezing.   Heart sounds S1,S2.  Murmurs are absent  Abdomen non  tender non distended  Ext: Edema is absent.  Pulses are palpable and symmetric  Neuro: non-focal.  Follows simple commands.       Pertinent Data Review:    CXR:  Reviewed both report and films.      Recent Results (from the past 24 hour(s))   D DIMER    Collection Time: 01/18/19  2:36 AM   Result Value Ref Range    D DIMER 0.56 (H) 0.01 - 0.50 ug/mL (FEU)   FERRITIN    Collection Time: 01/18/19  2:36 AM   Result Value Ref Range    Ferritin 76.5 26.0 - 388.0 ng/ml   C REACTIVE PROTEIN, QT    Collection Time: 01/18/19  2:36 AM   Result Value Ref Range    C-Reactive protein 7.2 (H) 0.0 - 2.9 mg/L   CBC WITH AUTOMATED DIFF    Collection Time: 01/18/19  2:36 AM   Result Value Ref Range    WBC 6.8 4.0 - 11.0 1000/mm3    RBC 4.26 3.80 - 5.70 M/uL    HGB 9.7 (L) 12.4 - 17.2 gm/dl    HCT 32.1 (L) 37.0 - 50.0 %    MCV 75.4 (L) 80.0 - 98.0 fL    MCH 22.8 (L) 23.0 - 34.6 pg    MCHC 30.2 30.0 - 36.0 gm/dl    PLATELET 200 140 - 450 1000/mm3    MPV N/A 6.0 - 10.0 fL    RDW-SD 58.3 (H) 35.1 - 43.9      NRBC 0 0 - 0      IMMATURE GRANULOCYTES 1.2 0.0 - 3.0 %    NEUTROPHILS 89.1 (H) 34 - 64 %    LYMPHOCYTES 7.0 (L) 28 - 48 %    MONOCYTES 2.5 1 - 13 %    EOSINOPHILS 0.1 0 - 5 %    BASOPHILS 0.1 0 - 3 %   MAGNESIUM    Collection Time: 01/18/19  2:36 AM   Result Value Ref Range    Magnesium 2.7 (H) 1.6 - 2.6 mg/dl   METABOLIC PANEL, BASIC    Collection Time: 01/18/19  2:36 AM   Result Value Ref Range    Sodium 137 136 - 145 mEq/L    Potassium 5.0 3.5 - 5.1 mEq/L    Chloride 106 98 - 107 mEq/L    CO2 26 21 - 32 mEq/L  Glucose 181 (H) 74 - 106 mg/dl    BUN 44 (H) 7 - 25 mg/dl    Creatinine 1.5 (H) 0.6 - 1.3 mg/dl    GFR est AA 59.0      GFR est non-AA 48      Calcium 8.7 8.5 - 10.1 mg/dl    Anion gap 5 5 - 15 mmol/L         General:  Stress Ulcer Protocol Active: yes  DVT Protocol Active: yes       Genia Harold, MD   3:23 PM 01/18/2019       Dragon medical dictation software was used for portions of this report.  Unintended voice  transcription errors may have occurred.

## 2019-01-19 LAB — LACTIC ACID
LACTIC ACID: 2.6 mmol/L — ABNORMAL HIGH (ref 0.4–2.0)
Lactic Acid: 2.6 mmol/L — ABNORMAL HIGH (ref 0.4–2.0)

## 2019-01-19 LAB — TROPONIN I: Troponin-I: <0.015 ng/ml (ref 0.000–0.045)

## 2019-01-19 LAB — C REACTIVE PROTEIN, QT: C-Reactive protein: 4 mg/L — ABNORMAL HIGH (ref 0.0–2.9)

## 2019-01-19 LAB — D DIMER: D DIMER: 0.55 ug/mL (FEU) — ABNORMAL HIGH (ref 0.01–0.50)

## 2019-01-19 LAB — PROCALCITONIN
PROCALCITONIN: <0.05 ng/ml (ref 0.00–0.50)
PROCALCITONIN: <0.05 ng/ml (ref 0.00–0.50)

## 2019-01-19 LAB — FIBRINOGEN
Fibrinogen: 321 mg/dl (ref 220–397)
Fibrinogen: 321 mg/dl (ref 220–397)

## 2019-01-19 LAB — TRIGLYCERIDE: Triglyceride: 131 mg/dL (ref 29–150)

## 2019-01-19 LAB — FERRITIN
Ferritin: 89.6 ng/ml (ref 26.0–388.0)
Ferritin: 89.6 ng/ml (ref 26.0–388.0)

## 2019-01-19 LAB — TROPONIN: Troponin I: <0.015 ng/ml (ref 0.000–0.045)

## 2019-01-19 LAB — TRIGLYCERIDES: Triglycerides: 131 mg/dl (ref 29–150)

## 2019-01-19 LAB — D-DIMER, QUANTITATIVE: D-Dimer, Quant: 0.55 ug/mL (FEU) — ABNORMAL HIGH (ref 0.01–0.50)

## 2019-01-19 LAB — C-REACTIVE PROTEIN: CRP: 4 mg/L — ABNORMAL HIGH (ref 0.0–2.9)

## 2019-01-19 MED FILL — FUROSEMIDE 10 MG/ML IJ SOLN: 10 mg/mL | INTRAMUSCULAR | Qty: 2

## 2019-01-19 MED FILL — ENOXAPARIN 40 MG/0.4 ML SUB-Q SYRINGE: 40 mg/0.4 mL | SUBCUTANEOUS | Qty: 0.4

## 2019-01-19 MED FILL — POLYVINYL ALCOHOL-POVIDONE 1.4 %-0.6 % EYE DROPPERETTE: OPHTHALMIC | Qty: 1

## 2019-01-19 MED FILL — LEVOFLOXACIN 750 MG TAB: 750 mg | ORAL | Qty: 1

## 2019-01-19 MED FILL — AMLODIPINE 5 MG TAB: 5 mg | ORAL | Qty: 2

## 2019-01-19 MED FILL — TAMSULOSIN SR 0.4 MG 24 HR CAP: 0.4 mg | ORAL | Qty: 2

## 2019-01-19 MED FILL — CREON 24,000-76,000-120,000 UNIT CAPSULE,DELAYED RELEASE: 24000-76000 -120,000 unit | ORAL | Qty: 1

## 2019-01-19 MED FILL — TEMAZEPAM 7.5 MG CAP: 7.5 mg | ORAL | Qty: 1

## 2019-01-19 MED FILL — FLORASTOR 250 MG CAPSULE: 250 mg | ORAL | Qty: 1

## 2019-01-19 MED FILL — DEXAMETHASONE 2 MG TAB: 2 mg | ORAL | Qty: 3

## 2019-01-19 MED FILL — ALLOPURINOL 300 MG TAB: 300 mg | ORAL | Qty: 1

## 2019-01-19 MED FILL — METOPROLOL SUCCINATE SR 25 MG 24 HR TAB: 25 mg | ORAL | Qty: 1

## 2019-01-19 MED FILL — GABAPENTIN 300 MG CAP: 300 mg | ORAL | Qty: 2

## 2019-01-19 NOTE — Progress Notes (Signed)
Problem: Gas Exchange - Impaired  Goal: *Absence of hypoxia  Outcome: Progressing Towards Goal     Problem: Patient Education: Go to Patient Education Activity  Goal: Patient/Family Education  Outcome: Progressing Towards Goal

## 2019-01-19 NOTE — Other (Signed)
Bedside and Verbal shift change report given to Kasandra F Keller (oncoming nurse) by Danica RN (offgoing nurse). Report included the following information SBAR and Kardex.

## 2019-01-19 NOTE — Other (Signed)
Bedside and Verbal shift change report given to Danica T Sahagun RN   (oncoming nurse) by Kassandra RN (offgoing nurse). Report included the following information SBAR, Kardex and MAR.

## 2019-01-19 NOTE — Progress Notes (Signed)
Stable oxygen demands - on 6 to 7 L.  Should continue aggressively weaning ox down.   Will follow intermittently.     Genia Harold, MD

## 2019-01-19 NOTE — Progress Notes (Signed)
Medical Progress Note      NAME: Tommy Brown   DOB:  08/21/1945  MRN:             [id number]    Date:  01/19/2019        Assessment:     1. Acute hypoxic respiratory failure  2. COVID-19 infection  3. Multifocal pneumonia  4. Pancytopenia  5. UTI with Klebsiella pneumoniae  6. Hypertension  7. CAD status post CABG  8. Gout  9. Pancreatic cancer  10. Neuropathy    Plan:     ?? Respiratory status is improving, continue to wean down oxygen   ?? Home O2 evaluation prior to discharge   ?? Continue dexamethasone daily complete 10 days course of treatment  ?? Patient already received convalescent plasma and completed remdesivir  ?? Pulmonary and ID also following   ?? Discussed with the patient   ?? Discussed with and updated his wife    Subjective:     Shortness of breathing is improving, denies fever    Objective:     Vitals:    Last 24hrs VS reviewed since prior progress note. Most recent are:  Visit Vitals  BP (!) 106/54 (BP 1 Location: Left arm, BP Patient Position: Supine)   Pulse 84   Temp 97.6 ??F (36.4 ??C)   Resp 18   Wt 61.2 kg (135 lb)   SpO2 98%   BMI 20.53 kg/m??     SpO2 Readings from Last 6 Encounters:   01/19/19 98%   07/31/18 98%   07/17/18 99%   06/26/18 97%   01/21/18 96%    O2 Flow Rate (L/min): 7 l/min       Intake/Output Summary (Last 24 hours) at 01/19/2019 1509  Last data filed at 01/19/2019 1347  Gross per 24 hour   Intake 820 ml   Output 1975 ml   Net -1155 ml        Exam:     General:   Not in acute distress  HEENT: PERRLA, Neck Supple,  No JVD  Respiratory:   CTA bilaterally-no wheezes, rales, rhonchi, or crackles  Cardiac:  Regular Rate and Rythmn  - no murmurs, rubs or gallops  Abdominal:  Soft, non-tender, non-distended, positive bowel sounds  Extremities:  No cyanosis, or edema.  Skin: No rash  Neurological:  No focal neurological deficits    Medication:   Current Medications Reviewed    Current Facility-Administered Medications:    ???  furosemide (LASIX) injection 20 mg, 20 mg, IntraVENous, BID, Lendora Keys, Tami Ribas, MD, 20 mg at 01/19/19 0857  ???  dexAMETHasone (DECADRON) tablet 6 mg, 6 mg, Oral, DAILY WITH DINNER, Hannalee Castor, Tami Ribas, MD, 6 mg at 01/18/19 1732  ???  ketotifen (ZADITOR) 0.025 % (0.035 %) ophthalmic solution 1 Drop, 1 Drop, Both Eyes, BID, Stanton Kidney, MD, 1 Drop at 01/19/19 0901  ???  promethazine (PHENERGAN) 25 mg in NS IVPB, 25 mg, IntraVENous, Q6H PRN, Stanton Kidney, MD, Last Rate: 200 mL/hr at 01/17/19 1637, 25 mg at 01/17/19 1637  ???  allopurinoL (ZYLOPRIM) tablet 300 mg, 300 mg, Oral, DAILY, Stanton Kidney, MD, 300 mg at 01/19/19 0858  ???  gabapentin (NEURONTIN) capsule 600 mg, 600 mg, Oral, BID, Rodriguez Aguinaldo, Tami Ribas, MD, 600 mg at 01/19/19 0858  ???  Saccharomyces boulardii (FLORASTOR) capsule 250 mg, 250 mg, Oral, ACB&D, Stanton Kidney, MD, 250 mg at 01/19/19 0620  ???  lipase-protease-amylase (CREON 24,000) capsule 1 Cap, 1  Cap, Oral, TID WITH MEALS, Stanton Kidney, MD, 1 Cap at 01/19/19 1232  ???  metoprolol succinate (TOPROL-XL) XL tablet 25 mg, 25 mg, Oral, DAILY, Stanton Kidney, MD, 25 mg at 01/19/19 0858  ???  polyvinyl alcohol-povidon(PF) (REFRESH CLASSIC) 1.4-0.6 % ophthalmic solution 2 Drop, 2 Drop, Both Eyes, DAILY, Scot Shiraishi, Tami Ribas, MD, 2 Drop at 01/19/19 0858  ???  tamsulosin (FLOMAX) capsule 0.8 mg, 0.8 mg, Oral, DAILY, Merlon Alcorta, Tami Ribas, MD, 0.8 mg at 01/19/19 0858  ???  temazepam (RESTORIL) capsule 7.5 mg, 7.5 mg, Oral, QHS PRN, Stanton Kidney, MD, 7.5 mg at 01/18/19 2221  ???  naloxone (NARCAN) injection 0.1 mg, 0.1 mg, IntraVENous, PRN, Asheton Viramontes, Tami Ribas, MD  ???  ondansetron (ZOFRAN) injection 4 mg, 4 mg, IntraVENous, Q4H PRN, Delene Loll, Tami Ribas, MD  ???  enoxaparin (LOVENOX) injection 40 mg, 40 mg, SubCUTAneous, Q24H, Johnathon Olden, Tami Ribas, MD, 40 mg at 01/18/19 1732  ???  amLODIPine (NORVASC) tablet 10 mg, 10 mg, Oral, DAILY, Atlee Villers, Tami Ribas, MD, 10 mg at 01/19/19 0858   ???  hydrALAZINE (APRESOLINE) 20 mg/mL injection 10 mg, 10 mg, IntraVENous, Q6H PRN, Raahi Korber, Tami Ribas, MD  ???  0.9% sodium chloride infusion 250 mL, 250 mL, IntraVENous, PRN, Delene Loll, Tami Ribas, MD      Lab:     Lab Data Reviewed: (see below)  Recent Results (from the past 24 hour(s))   PROCALCITONIN    Collection Time: 01/19/19  4:11 AM   Result Value Ref Range    PROCALCITONIN <0.05 0.00 - 0.50 ng/ml   C REACTIVE PROTEIN, QT    Collection Time: 01/19/19  4:11 AM   Result Value Ref Range    C-Reactive protein 4.0 (H) 0.0 - 2.9 mg/L   LACTIC ACID    Collection Time: 01/19/19  4:11 AM   Result Value Ref Range    Lactic Acid 2.6 (H) 0.4 - 2.0 mmol/L   TRIGLYCERIDE    Collection Time: 01/19/19  4:11 AM   Result Value Ref Range    Triglyceride 131 29 - 150 mg/dl   FIBRINOGEN    Collection Time: 01/19/19  4:11 AM   Result Value Ref Range    Fibrinogen 321 220 - 397 mg/dl   D DIMER    Collection Time: 01/19/19  4:11 AM   Result Value Ref Range    D DIMER 0.55 (H) 0.01 - 0.50 ug/mL (FEU)   TROPONIN I    Collection Time: 01/19/19  4:11 AM   Result Value Ref Range    Troponin-I <0.015 0.000 - 0.045 ng/ml   FERRITIN    Collection Time: 01/19/19  4:11 AM   Result Value Ref Range    Ferritin 89.6 26.0 - 388.0 ng/ml       No results found.    Active Problems:    Pneumonia due to COVID-19 virus (01/10/2019)      ____________________________________________________________________      Attending Physician: Stanton Kidney, MD       Dragon medical dictation software was used for portions of this report. Unintended voice recognition errors may occur.

## 2019-01-19 NOTE — Progress Notes (Signed)
Chart reviewed , DC plan continues to be home, currently on 6L of 02 , pulmonary and ID continues to follow . Continues with IV decadron. Will continue to follow for DC needs.

## 2019-01-19 NOTE — Progress Notes (Signed)
Problem: Gas Exchange - Impaired  Goal: *Absence of hypoxia  01/19/2019 1101 by Darlis Loan, RT  Outcome: Progressing Towards Goal  01/19/2019 1100 by Darlis Loan, RT  Outcome: Progressing Towards Goal     Problem: Patient Education: Go to Patient Education Activity  Goal: Patient/Family Education  01/19/2019 1101 by Darlis Loan, RT  Outcome: Progressing Towards Goal  01/19/2019 1100 by Darlis Loan, RT  Outcome: Progressing Towards Goal

## 2019-01-19 NOTE — Progress Notes (Signed)
Medical Progress Note      NAME: Tommy Brown   DOB:  June 22, 1945  MRN:             [id number]    Date:  01/19/2019        Assessment:     1. Acute hypoxic respiratory failure  2. COVID-19 infection  3. Multifocal pneumonia  4. Pancytopenia  5. UTI with Klebsiella pneumoniae  6. Hypertension  7. CAD status post CABG  8. Gout  9. Pancreatic cancer  10. Neuropathy    Plan:     ?? Respiratory status is improving, continue to wean down oxygen   ?? Home O2 evaluation prior to discharge   ?? Continue dexamethasone daily complete 10 days course of treatment  ?? Patient already received convalescent plasma and completed remdesivir  ?? Pulmonary and ID also following   ?? Discussed with the patient   ?? Discussed with and updated his wife    Subjective:     Shortness of breathing is improving, denies fever    Objective:     Vitals:    Last 24hrs VS reviewed since prior progress note. Most recent are:  Visit Vitals  BP (!) 106/54 (BP 1 Location: Left arm, BP Patient Position: Supine)   Pulse 84   Temp 97.6 ??F (36.4 ??C)   Resp 18   Wt 61.2 kg (135 lb)   SpO2 98%   BMI 20.53 kg/m??     SpO2 Readings from Last 6 Encounters:   01/19/19 98%   07/31/18 98%   07/17/18 99%   06/26/18 97%   01/21/18 96%    O2 Flow Rate (L/min): 7 l/min       Intake/Output Summary (Last 24 hours) at 01/19/2019 1509  Last data filed at 01/19/2019 1347  Gross per 24 hour   Intake 820 ml   Output 1975 ml   Net -1155 ml        Exam:     General:   Not in acute distress  HEENT: PERRLA, Neck Supple,  No JVD  Respiratory:   CTA bilaterally-no wheezes, rales, rhonchi, or crackles  Cardiac:  Regular Rate and Rythmn  - no murmurs, rubs or gallops  Abdominal:  Soft, non-tender, non-distended, positive bowel sounds  Extremities:  No cyanosis, or edema.  Skin: No rash  Neurological:  No focal neurological deficits    Medication:   Current Medications Reviewed    Current Facility-Administered Medications:   ???  furosemide (LASIX) injection 20 mg, 20 mg,  IntraVENous, BID, Lakia Gritton, Tami Ribas, MD, 20 mg at 01/19/19 0857  ???  dexAMETHasone (DECADRON) tablet 6 mg, 6 mg, Oral, DAILY WITH DINNER, Chantay Whitelock, Tami Ribas, MD, 6 mg at 01/18/19 1732  ???  ketotifen (ZADITOR) 0.025 % (0.035 %) ophthalmic solution 1 Drop, 1 Drop, Both Eyes, BID, Stanton Kidney, MD, 1 Drop at 01/19/19 0901  ???  promethazine (PHENERGAN) 25 mg in NS IVPB, 25 mg, IntraVENous, Q6H PRN, Stanton Kidney, MD, Last Rate: 200 mL/hr at 01/17/19 1637, 25 mg at 01/17/19 1637  ???  allopurinoL (ZYLOPRIM) tablet 300 mg, 300 mg, Oral, DAILY, Stanton Kidney, MD, 300 mg at 01/19/19 0858  ???  gabapentin (NEURONTIN) capsule 600 mg, 600 mg, Oral, BID, Laurell Coalson, Tami Ribas, MD, 600 mg at 01/19/19 0858  ???  Saccharomyces boulardii (FLORASTOR) capsule 250 mg, 250 mg, Oral, ACB&D, Stanton Kidney, MD, 250 mg at 01/19/19 0620  ???  lipase-protease-amylase (CREON 24,000) capsule 1 Cap, 1  Cap, Oral, TID WITH MEALS, Stanton Kidney, MD, 1 Cap at 01/19/19 1232  ???  metoprolol succinate (TOPROL-XL) XL tablet 25 mg, 25 mg, Oral, DAILY, Stanton Kidney, MD, 25 mg at 01/19/19 0858  ???  polyvinyl alcohol-povidon(PF) (REFRESH CLASSIC) 1.4-0.6 % ophthalmic solution 2 Drop, 2 Drop, Both Eyes, DAILY, Terence Bart, Tami Ribas, MD, 2 Drop at 01/19/19 0858  ???  tamsulosin (FLOMAX) capsule 0.8 mg, 0.8 mg, Oral, DAILY, Bronc Brosseau, Tami Ribas, MD, 0.8 mg at 01/19/19 0858  ???  temazepam (RESTORIL) capsule 7.5 mg, 7.5 mg, Oral, QHS PRN, Stanton Kidney, MD, 7.5 mg at 01/18/19 2221  ???  naloxone (NARCAN) injection 0.1 mg, 0.1 mg, IntraVENous, PRN, Noriko Macari, Tami Ribas, MD  ???  ondansetron (ZOFRAN) injection 4 mg, 4 mg, IntraVENous, Q4H PRN, Delene Loll, Tami Ribas, MD  ???  enoxaparin (LOVENOX) injection 40 mg, 40 mg, SubCUTAneous, Q24H, Camay Pedigo, Tami Ribas, MD, 40 mg at 01/18/19 1732  ???  amLODIPine (NORVASC) tablet 10 mg, 10 mg, Oral, DAILY, Troi Bechtold, Tami Ribas, MD, 10 mg at 01/19/19 0858  ???  hydrALAZINE (APRESOLINE) 20 mg/mL injection 10 mg, 10 mg, IntraVENous, Q6H PRN, Poetry Cerro, Tami Ribas, MD  ???  0.9% sodium  chloride infusion 250 mL, 250 mL, IntraVENous, PRN, Delene Loll, Tami Ribas, MD      Lab:     Lab Data Reviewed: (see below)  Recent Results (from the past 24 hour(s))   PROCALCITONIN    Collection Time: 01/19/19  4:11 AM   Result Value Ref Range    PROCALCITONIN <0.05 0.00 - 0.50 ng/ml   C REACTIVE PROTEIN, QT    Collection Time: 01/19/19  4:11 AM   Result Value Ref Range    C-Reactive protein 4.0 (H) 0.0 - 2.9 mg/L   LACTIC ACID    Collection Time: 01/19/19  4:11 AM   Result Value Ref Range    Lactic Acid 2.6 (H) 0.4 - 2.0 mmol/L   TRIGLYCERIDE    Collection Time: 01/19/19  4:11 AM   Result Value Ref Range    Triglyceride 131 29 - 150 mg/dl   FIBRINOGEN    Collection Time: 01/19/19  4:11 AM   Result Value Ref Range    Fibrinogen 321 220 - 397 mg/dl   D DIMER    Collection Time: 01/19/19  4:11 AM   Result Value Ref Range    D DIMER 0.55 (H) 0.01 - 0.50 ug/mL (FEU)   TROPONIN I    Collection Time: 01/19/19  4:11 AM   Result Value Ref Range    Troponin-I <0.015 0.000 - 0.045 ng/ml   FERRITIN    Collection Time: 01/19/19  4:11 AM   Result Value Ref Range    Ferritin 89.6 26.0 - 388.0 ng/ml       No results found.    Active Problems:    Pneumonia due to COVID-19 virus (01/10/2019)      ____________________________________________________________________      Attending Physician: Stanton Kidney, MD       Dragon medical dictation software was used for portions of this report. Unintended voice recognition errors may occur.

## 2019-01-20 LAB — D DIMER: D DIMER: 0.5 ug/mL (FEU) (ref 0.01–0.50)

## 2019-01-20 LAB — METABOLIC PANEL, BASIC
Anion gap: 9 mmol/L (ref 5–15)
BUN: 62 mg/dl — ABNORMAL HIGH (ref 7–25)
CO2: 29 mEq/L (ref 21–32)
Calcium: 9.2 mg/dl (ref 8.5–10.1)
Chloride: 98 mEq/L (ref 98–107)
Creatinine: 2 mg/dl — ABNORMAL HIGH (ref 0.6–1.3)
GFR est AA: 42
GFR est non-AA: 35
Glucose: 183 mg/dl — ABNORMAL HIGH (ref 74–106)
Potassium: 4.7 mEq/L (ref 3.5–5.1)
Sodium: 136 mEq/L (ref 136–145)

## 2019-01-20 LAB — CBC WITH AUTOMATED DIFF
BASOPHILS: 0.1 % (ref 0–3)
EOSINOPHILS: 0 % (ref 0–5)
HCT: 35.7 % — ABNORMAL LOW (ref 37.0–50.0)
HGB: 11 gm/dl — ABNORMAL LOW (ref 12.4–17.2)
IMMATURE GRANULOCYTES: 1.9 % (ref 0.0–3.0)
LYMPHOCYTES: 7.7 % — ABNORMAL LOW (ref 28–48)
MCH: 23.1 pg (ref 23.0–34.6)
MCHC: 30.8 gm/dl (ref 30.0–36.0)
MCV: 75 fL — ABNORMAL LOW (ref 80.0–98.0)
MONOCYTES: 2.5 % (ref 1–13)
NEUTROPHILS: 87.8 % — ABNORMAL HIGH (ref 34–64)
NRBC: 0 (ref 0–0)
PLATELET: 220 10*3/uL (ref 140–450)
RBC: 4.76 M/uL (ref 3.80–5.70)
RDW-SD: 58.7 — ABNORMAL HIGH (ref 35.1–43.9)
WBC: 8.5 10*3/uL (ref 4.0–11.0)

## 2019-01-20 LAB — MAGNESIUM
Magnesium: 2.9 mg/dl — ABNORMAL HIGH (ref 1.6–2.6)
Magnesium: 2.9 mg/dl — ABNORMAL HIGH (ref 1.6–2.6)

## 2019-01-20 LAB — FERRITIN
Ferritin: 79.6 ng/mL (ref 26.0–388.0)
Ferritin: 79.6 ng/ml (ref 26.0–388.0)

## 2019-01-20 LAB — C REACTIVE PROTEIN, QT: C-Reactive protein: 3 mg/L — ABNORMAL HIGH (ref 0.0–2.9)

## 2019-01-20 LAB — BASIC METABOLIC PANEL
Anion Gap: 9 mmol/L (ref 5–15)
BUN: 62 mg/dl — ABNORMAL HIGH (ref 7–25)
CO2: 29 mEq/L (ref 21–32)
Calcium: 9.2 mg/dl (ref 8.5–10.1)
Chloride: 98 mEq/L (ref 98–107)
Creatinine: 2 mg/dl — ABNORMAL HIGH (ref 0.6–1.3)
EGFR IF NonAfrican American: 35
GFR African American: 42
Glucose: 183 mg/dl — ABNORMAL HIGH (ref 74–106)
Potassium: 4.7 mEq/L (ref 3.5–5.1)
Sodium: 136 mEq/L (ref 136–145)

## 2019-01-20 LAB — CBC WITH AUTO DIFFERENTIAL
Basophils %: 0.1 % (ref 0–3)
Eosinophils %: 0 % (ref 0–5)
Hematocrit: 35.7 % — ABNORMAL LOW (ref 37.0–50.0)
Hemoglobin: 11 gm/dl — ABNORMAL LOW (ref 12.4–17.2)
Immature Granulocytes: 1.9 % (ref 0.0–3.0)
Lymphocytes %: 7.7 % — ABNORMAL LOW (ref 28–48)
MCH: 23.1 pg (ref 23.0–34.6)
MCHC: 30.8 gm/dl (ref 30.0–36.0)
MCV: 75 fL — ABNORMAL LOW (ref 80.0–98.0)
Monocytes %: 2.5 % (ref 1–13)
Neutrophils %: 87.8 % — ABNORMAL HIGH (ref 34–64)
Nucleated RBCs: 0 (ref 0–0)
Platelets: 220 10*3/uL (ref 140–450)
RBC: 4.76 M/uL (ref 3.80–5.70)
RDW-SD: 58.7 — ABNORMAL HIGH (ref 35.1–43.9)
WBC: 8.5 10*3/uL (ref 4.0–11.0)

## 2019-01-20 LAB — D-DIMER, QUANTITATIVE: D-Dimer, Quant: 0.5 ug/mL (FEU) (ref 0.01–0.50)

## 2019-01-20 LAB — C-REACTIVE PROTEIN: CRP: 3 mg/L — ABNORMAL HIGH (ref 0.0–2.9)

## 2019-01-20 MED FILL — FLORASTOR 250 MG CAPSULE: 250 mg | ORAL | Qty: 1

## 2019-01-20 MED FILL — METOPROLOL SUCCINATE SR 25 MG 24 HR TAB: 25 mg | ORAL | Qty: 1

## 2019-01-20 MED FILL — GABAPENTIN 300 MG CAP: 300 mg | ORAL | Qty: 2

## 2019-01-20 MED FILL — TAMSULOSIN SR 0.4 MG 24 HR CAP: 0.4 mg | ORAL | Qty: 2

## 2019-01-20 MED FILL — AMLODIPINE 5 MG TAB: 5 mg | ORAL | Qty: 2

## 2019-01-20 MED FILL — FUROSEMIDE 10 MG/ML IJ SOLN: 10 mg/mL | INTRAMUSCULAR | Qty: 2

## 2019-01-20 MED FILL — CREON 24,000-76,000-120,000 UNIT CAPSULE,DELAYED RELEASE: 24000-76000 -120,000 unit | ORAL | Qty: 1

## 2019-01-20 MED FILL — ALLOPURINOL 300 MG TAB: 300 mg | ORAL | Qty: 1

## 2019-01-20 MED FILL — TEMAZEPAM 7.5 MG CAP: 7.5 mg | ORAL | Qty: 1

## 2019-01-20 MED FILL — POLYVINYL ALCOHOL-POVIDONE 1.4 %-0.6 % EYE DROPPERETTE: OPHTHALMIC | Qty: 1

## 2019-01-20 NOTE — Discharge Summary (Signed)
Discharge Summary      Patient Name: Tommy Brown  Medical Record Number: [id number]  Date of Birth: Apr 01, 1945  Discharge Provider: Stanton Kidney, MD  Primary Care Provider: Daralene Milch, MD    Admit date: 01/10/2019  Discharge date: 01/20/2019  Discharge Disposition: Home.  Code Status: Full Code        Follow-up appointments:     Follow-up Information     Follow up With Specialties Details Why Contact Info    Deguzman-Berube, Aundra Dubin, MD Family Medicine   [address]  [address] [place] [address]  [phone]             Follow-up recommendations:     Follow up with PCP in 1 week      Discharge diagnosis:      1. Acute hypoxic respiratory failure  2. COVID-19 infection  3. Multifocal pneumonia  4. Pancytopenia  5. UTI with Klebsiella pneumoniae  6. Hypertension  7. CAD status post CABG  8. Gout  9. Pancreatic cancer  10. Neuropathy  Discharge Medications:      Current Discharge Medication List      CONTINUE these medications which have NOT CHANGED    Details   metoprolol succinate (TOPROL-XL) 25 mg XL tablet TAKE 1 TABLET BY MOUTH EVERY DAY      tamsulosin (Flomax) 0.4 mg capsule Take 2 Caps by mouth daily.  Qty: 60 Cap, Refills: 1      gabapentin (NEURONTIN) 600 mg tablet Take 600 mg by mouth two (2) times a day.      amLODIPine (NORVASC) 5 mg tablet Take 5 mg by mouth daily.      allopurinol (ZYLOPRIM) 300 mg tablet Take  by mouth daily.      aspirin 81 mg chewable tablet Take 81 mg by mouth daily.      testosterone (ANDROGEL) 20.25 mg/1.25 gram (1.62 %) gel Apply 20 mg to affected area daily. Max Daily Amount: 20 mg.  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      temazepam (RESTORIL) 7.5 mg capsule Take  by mouth nightly.      lactobacillus rhamnosus gg 10 billion cell (Culturelle) 10 billion cell capsule Take 1 Cap by mouth Before breakfast and dinner.  Qty: 40 Cap, Refills: 0      peg 400-propylene glycol (Systane, propylene glycol,) 0.4-0.3 % drop Administer 2 Drops to both eyes daily. Patient places two drops in both eyes every AM.   Indications: dry eye         STOP taking these medications       ketorolac (TORADOL) 10 mg tablet Comments:   Reason for Stopping:         metoprolol tartrate (LOPRESSOR) 25 mg tablet Comments:   Reason for Stopping:         TESTOSTERONE IM Comments:   Reason for Stopping:  Discharge Diet: Regular Diet    Consultants: Pulmonary/Intensive care    Procedures:   * No surgery found *      History of presenting illness: Per admitting physician     Tommy Brown is a 73 y.o.   male with a PMH of hypertension, CAD status post CABG, gout, pancreatic CA, neuropathy who presents with c/c of fever.  Patient has fever and chills since last Tuesday.  Patient denies chest pain but he has shortness of breathing.  Patient denies nausea vomiting or abdominal pain.  Patient was tested for COVID-19 in the emergency room and came back positive.  Hospital course:      Patient was admitted to medical service.patient was tested positive for COVID-19.  He was treated with 5-day course of remdesivir, 10 days course of dexamethasone and convalescent plasma.  Respiratory status continue to improve.  Patient was on high flow oxygen now currently on 4 L nasal cannula.  Home O2 evaluation is ordered and patient will be discharged home today.    Physical exam:  General:   Not in acute distress   HEENT: PERRLA, Neck Supple,  No JVD  Respiratory:   CTA bilaterally-no wheezes, rales, rhonchi, or crackles  Cardiac:  Regular Rate and Rythmn  - no murmurs, rubs or gallops  Abdominal:  Soft, non-tender, non-distended, positive bowel sounds  Extremities:  No cyanosis, or edema.  Skin: No rash  Neurological:  No focal neurological deficits    Discharge condition: Good    Discharge activity and restrictions: Activity as tolerated    Time spent with discharging patient: 38 minutes      Stanton Kidney, MD  01/20/2019  12:43 PM    Copies: Daralene Milch, MD    Dragon medical dictation software was used for portions of this report. Unintended voice recognition errors may occur.

## 2019-01-20 NOTE — Progress Notes (Signed)
01/20/19 1316 01/20/19 1320 01/20/19 1324   RT Walking Oximetry   Stage Resting (Room Air) During Walk (Room Air) After Walk   SpO2 93 % (!) 86 % 93 %   HR 71 bpm 91 bpm 79 bpm   Rate of Dyspnea 0 1 0   O2 Device None (Room air) None (Room air) Nasal cannula   O2 Flow Rate (L/min)  --   --  2 l/min   FIO2 (%) 21 % 21 %  --    Comments  --  exercise done in room  --       01/20/19 1327   RT Walking Oximetry   Stage After Walk   SpO2 96 %   HR 76 bpm   Rate of Dyspnea 0   O2 Device Nasal cannula   O2 Flow Rate (L/min) 3 l/min   FIO2 (%)  --    Comments  --

## 2019-01-20 NOTE — Progress Notes (Signed)
NUTRITION RECOMMENDATIONS:   ?? Rec continue regular diet.  ?? Rec continue Ensure Enlive TID (350 kcals, 20 g protein each)  ?? Daily wts to trend.    NUTRITION FOLLOW UP    Current Diet Order: DIET REGULAR  DIET NUTRITIONAL SUPPLEMENTS All Meals; Ensure Enlive    Current Intake: '[]'  N/A- NPO    '[]'  very poor     '[x]'  poor      '[]'  fair      '[]'  good  % Diet Eaten  Avg: 35 %  Min: 0 %  Max: 100 %  Current documented intake provides approximately 717 kcal (39%), 35 g protein (48%) of pt's estimated needs. If pt consuming 100% of ONS, pt meeting ~96% kcals needs, ~100% protein needs.    Pertinent Medications: lovenox, lasix, gabapentin, creon, probiotic    Pertinent Labs: 11/25 H/H 11.0/35.7 L, Glu 183 H, BUN/Cr 62/2.0 H, GFR 35 L (CKD), CRP 3.0 H (COVID)    Weight: Initial admit wt of 61.2 kg.    Last 3 Recorded Weights in this Encounter    01/10/19 1012   Weight: 61.2 kg (135 lb)     BMI: Body mass index is 20.53 kg/m??.    Estimated Daily Nutrition Needs: 61.2 kg  ?? 1836 - 2142 kcals (30-35 kcal/kg)  ?? 73 - 92 g protein (1.2-1.5 g/kg)  ?? 1224 - 1530 mL fluid (20-25 ml/kg or per MD)    Physical Assessment:  GI Symptoms:   Last Bowel Movement Date: 01/19/19  Stool Appearance: Loose  Abdominal Assessment: Intact  Chewing/Swallowing Issues:   None identified   Skin Integrity:   Intact   Fluid Accumulation:   None     Assessment of Current MNT: Diet is adequate if intake averages 75% at most meals - recommend adding oral supplement TID to increase protein-calorie intake opportunity.     Nutrition Diagnosis:   1. Inadequate energy intake related to COVID-19 as evidenced by pt consuming less than 50% of estimated needs X1 week PTA. (continues)  ??  2. Increased energy expenditure related to metabolic demand for catabolic state as evidenced by COVID-19. (continues)    Nutrition Recommendation:   ?? Rec continue regular diet.  ?? Rec continue Ensure Enlive TID (350 kcals, 20 g protein each)  ?? Daily wts to trend.     Monitoring and Evaluation: Wt trend, PO intake, nutrition-related labs, and s/sx of new skin concerns; will f/u per policy.  ??  Nutrition Goals:  Pt to meet/tolerate >75% of estimated needs, maintain weight throughout LOS, BG control <180, maintain skin integrity.    Nutrition Level of Care:  '[]'  Low     '[x]'  Moderate     '[]'  High    Progress Towards Nutrition Goals: '[]'   Met/Ongoing     '[]'   Progressing Appropriately     '[x]'   Progressing Slowly     '[]'   Not Progressing    Code Status: Full Code    Discharge Placement: Home    Discharge Recommendations: 2gm Na diet + ONS    Tana Conch, MS, RDN  01/20/19   Pager: [phone]  Office: [phone]

## 2019-01-20 NOTE — Other (Addendum)
----------  DocumentID: [id number]------------------------------------------------              [facility]                       Patient Education Report         Name: MACIE, SCHAER                  Date: 01/12/2019    MRN: [id number]                    Time: 10:39:36 AM         Patient ordered video: 'Patient Safety: Stay Safe While you are in the Hospital'    from [id number] via phone number: [phone] at 10:39:36 AM    Description: This program outlines some of the precautions patients can take to ensure a speedy recovery without extra complications. The video emphasizes the importance of communicating with the healthcare team.    ----------DocumentID: [id number]------------------------------------------------                       [facility]          Patient Education Report - Discharge Summary        Date: 01/20/2019   Time: 4:52:40 PM   Name: JOSIMAR, STEINBERGER   MRN: [id number]      Account Number: [id number]      Education History:        Patient ordered video: 'Patient Safety: Stay Safe While you are in the Hospital' from [id number] on 01/12/2019 10:39:36 AM

## 2019-01-20 NOTE — Progress Notes (Signed)
POC w/pulse ox delivered to PT's nurse Raquel Sarna. Spw spouse home setup for Friday.

## 2019-01-20 NOTE — Progress Notes (Signed)
Per MD patient is stable for discharge , waiting on RT home 02 eval to be completed.

## 2019-01-20 NOTE — Progress Notes (Signed)
Problem: Falls - Risk of  Goal: *Absence of Falls  Description: Document Tommy Brown Fall Risk and appropriate interventions in the flowsheet.  Outcome: Resolved/Met

## 2019-01-20 NOTE — Other (Signed)
Bedside and Verbal shift change report given to Raquel Sarna (Soil scientist) by Fredric Dine (offgoing nurse). Report included the following information SBAR, Kardex, Intake/Output, MAR and Recent Results.

## 2019-01-20 NOTE — Other (Signed)
Patient to be discharged home. Discharge paperwork given to patient who signed and verbalized understanding. Pt education given on 02 and use of portable concentrator. No questions or concerns at this time. Pt escorted to main entrance via wc by nurse.

## 2019-01-20 NOTE — Progress Notes (Signed)
Discharge Plan:   Home with family     Discharge Date:     01/20/2019     Assisted Living Facility:    N/a     Is patient going to snf? N/A    Home Health Needed:  declined    DME needed and ordered for Discharge:    Oxygen and pulse oximetry     DME Company:   [employer]    CM confirmed delivery of DME: with     [employer]    Transportation: Family

## 2019-01-20 NOTE — Progress Notes (Signed)
Problem: Falls - Risk of  Goal: *Absence of Falls  Description: Document Schmid Fall Risk and appropriate interventions in the flowsheet.  Outcome: Resolved/Met

## 2019-01-20 NOTE — Discharge Summary (Signed)
Discharge Summary      Patient Name: Tommy Brown  Medical Record Number: [id number]  Date of Birth: May 22, 1945  Discharge Provider: Stanton Kidney, MD  Primary Care Provider: Daralene Milch, MD    Admit date: 01/10/2019  Discharge date: 01/20/2019  Discharge Disposition: Home.  Code Status: Full Code        Follow-up appointments:     Follow-up Information     Follow up With Specialties Details Why Contact Info    Deguzman-Berube, Aundra Dubin, MD Family Medicine   [address]  [address] [place] [address]  [phone]             Follow-up recommendations:     Follow up with PCP in 1 week      Discharge diagnosis:      1. Acute hypoxic respiratory failure  2. COVID-19 infection  3. Multifocal pneumonia  4. Pancytopenia  5. UTI with Klebsiella pneumoniae  6. Hypertension  7. CAD status post CABG  8. Gout  9. Pancreatic cancer  10. Neuropathy  Discharge Medications:      Current Discharge Medication List      CONTINUE these medications which have NOT CHANGED    Details   metoprolol succinate (TOPROL-XL) 25 mg XL tablet TAKE 1 TABLET BY MOUTH EVERY DAY      tamsulosin (Flomax) 0.4 mg capsule Take 2 Caps by mouth daily.  Qty: 60 Cap, Refills: 1      gabapentin (NEURONTIN) 600 mg tablet Take 600 mg by mouth two (2) times a day.      amLODIPine (NORVASC) 5 mg tablet Take 5 mg by mouth daily.      allopurinol (ZYLOPRIM) 300 mg tablet Take  by mouth daily.      aspirin 81 mg chewable tablet Take 81 mg by mouth daily.      testosterone (ANDROGEL) 20.25 mg/1.25 gram (1.62 %) gel Apply 20 mg to affected area daily. Max Daily Amount: 20 mg.  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      temazepam (RESTORIL) 7.5 mg capsule Take  by mouth nightly.      lactobacillus rhamnosus gg 10 billion cell (Culturelle) 10 billion cell capsule Take 1 Cap by mouth Before breakfast and dinner.  Qty: 40 Cap, Refills: 0      peg 400-propylene glycol (Systane, propylene glycol,) 0.4-0.3 % drop Administer 2 Drops to both eyes daily. Patient places two drops in both eyes every AM.   Indications: dry eye         STOP taking these medications       ketorolac (TORADOL) 10 mg tablet Comments:   Reason for Stopping:         metoprolol tartrate (LOPRESSOR) 25 mg tablet Comments:   Reason for Stopping:         TESTOSTERONE IM Comments:   Reason for Stopping:  Discharge Diet: Regular Diet    Consultants: Pulmonary/Intensive care    Procedures:   * No surgery found *      History of presenting illness: Per admitting physician     Mr. Kristofer Dusenbery is a 73 y.o.   male with a PMH of hypertension, CAD status post CABG, gout, pancreatic CA, neuropathy who presents with c/c of fever.  Patient has fever and chills since last Tuesday.  Patient denies chest pain but he has shortness of breathing.  Patient denies nausea vomiting or abdominal pain.  Patient was tested for COVID-19 in the emergency room and came back positive.  Hospital course:      Patient was admitted to medical service.patient was tested positive for COVID-19.  He was treated with 5-day course of remdesivir, 10 days course of dexamethasone and convalescent plasma.  Respiratory status continue to improve.  Patient was on high flow oxygen now currently on 4 L nasal cannula.  Home O2 evaluation is ordered and patient will be discharged home today.    Physical exam:  General:   Not in acute distress  HEENT: PERRLA, Neck Supple,  No JVD  Respiratory:   CTA bilaterally-no wheezes, rales, rhonchi, or  crackles  Cardiac:  Regular Rate and Rythmn  - no murmurs, rubs or gallops  Abdominal:  Soft, non-tender, non-distended, positive bowel sounds  Extremities:  No cyanosis, or edema.  Skin: No rash  Neurological:  No focal neurological deficits    Discharge condition: Good    Discharge activity and restrictions: Activity as tolerated    Time spent with discharging patient: 38 minutes      Stanton Kidney, MD  01/20/2019  12:43 PM    Copies: Daralene Milch, MD    Dragon medical dictation software was used for portions of this report. Unintended voice recognition errors may occur.

## 2019-01-20 NOTE — Progress Notes (Signed)
 NUTRITION RECOMMENDATIONS:    Rec continue regular diet.   Rec continue Ensure Enlive TID (350 kcals, 20 g protein each)   Daily wts to trend.    NUTRITION FOLLOW UP    Current Diet Order: DIET REGULAR  DIET NUTRITIONAL SUPPLEMENTS All Meals; Ensure Enlive    Current Intake: []  N/A- NPO    []  very poor     [x]  poor      []  fair      []  good  % Diet Eaten  Avg: 35 %  Min: 0 %  Max: 100 %  Current documented intake provides approximately 717 kcal (39%), 35 g protein (48%) of pt's estimated needs. If pt consuming 100% of ONS, pt meeting ~96% kcals needs, ~100% protein needs.    Pertinent Medications: lovenox , lasix, gabapentin , creon , probiotic    Pertinent Labs: 11/25 H/H 11.0/35.7 L, Glu 183 H, BUN/Cr 62/2.0 H, GFR 35 L (CKD), CRP 3.0 H (COVID)    Weight: Initial admit wt of 61.2 kg.    Last 3 Recorded Weights in this Encounter    01/10/19 1012   Weight: 61.2 kg (135 lb)     BMI: Body mass index is 20.53 kg/m.    Estimated Daily Nutrition Needs: 61.2 kg   1836 - 2142 kcals (30-35 kcal/kg)   73 - 92 g protein (1.2-1.5 g/kg)   1224 - 1530 mL fluid (20-25 ml/kg or per MD)    Physical Assessment:  GI Symptoms:   Last Bowel Movement Date: 01/19/19  Stool Appearance: Loose  Abdominal Assessment: Intact  Chewing/Swallowing Issues:   None identified   Skin Integrity:   Intact   Fluid Accumulation:   None     Assessment of Current MNT: Diet is adequate if intake averages 75% at most meals - recommend adding oral supplement TID to increase protein-calorie intake opportunity.     Nutrition Diagnosis:   1. Inadequate energy intake related to COVID-19 as evidenced by pt consuming less than 50% of estimated needs X1 week PTA. (continues)    2. Increased energy expenditure related to metabolic demand for catabolic state as evidenced by COVID-19. (continues)    Nutrition Recommendation:    Rec continue regular diet.   Rec continue Ensure Enlive TID (350 kcals, 20 g protein each)   Daily wts to trend.    Monitoring and  Evaluation: Wt trend, PO intake, nutrition-related labs, and s/sx of new skin concerns; will f/u per policy.    Nutrition Goals:  Pt to meet/tolerate >75% of estimated needs, maintain weight throughout LOS, BG control <180, maintain skin integrity.    Nutrition Level of Care:  []  Low     [x]  Moderate     []  High    Progress Towards Nutrition Goals: []   Met/Ongoing     []   Progressing Appropriately     [x]   Progressing Slowly     []   Not Progressing    Code Status: Full Code    Discharge Placement: Home    Discharge Recommendations: 2gm Na diet + ONS    Alan Carson, MS, RDN  01/20/19   Pager: [phone]  Office: [phone]

## 2019-01-27 ENCOUNTER — Encounter: Payer: MEDICARE | Attending: Urology | Primary: Family Medicine

## 2019-01-28 DIAGNOSIS — E291 Testicular hypofunction: Secondary | ICD-10-CM | POA: Diagnosis not present

## 2019-01-28 DIAGNOSIS — R509 Fever, unspecified: Secondary | ICD-10-CM | POA: Diagnosis not present

## 2019-01-28 DIAGNOSIS — Z09 Encounter for follow-up examination after completed treatment for conditions other than malignant neoplasm: Secondary | ICD-10-CM | POA: Diagnosis not present

## 2019-01-28 DIAGNOSIS — J9601 Acute respiratory failure with hypoxia: Secondary | ICD-10-CM | POA: Diagnosis not present

## 2019-01-28 DIAGNOSIS — U071 COVID-19: Secondary | ICD-10-CM | POA: Diagnosis not present

## 2019-02-03 ENCOUNTER — Encounter: Payer: MEDICARE | Attending: Urology | Primary: Family Medicine

## 2019-02-08 ENCOUNTER — Encounter

## 2019-02-08 ENCOUNTER — Inpatient Hospital Stay: Admit: 2019-02-08 | Payer: MEDICARE | Primary: Family Medicine

## 2019-02-08 DIAGNOSIS — J9601 Acute respiratory failure with hypoxia: Secondary | ICD-10-CM

## 2019-03-19 ENCOUNTER — Telehealth

## 2019-03-19 NOTE — Telephone Encounter (Signed)
Patient called and requesting urine UCX order to be sent out [facility] phone [phone]. Patient states he's been having problems urinating - difficult to start - urgency. Please assist. Thank you.

## 2019-03-22 ENCOUNTER — Encounter: Primary: Family Medicine

## 2019-03-24 NOTE — Telephone Encounter (Signed)
Patient called to follow up on Urine C&S order and T, CBC to be sent over to [facility] Phone [phone] and Fax [phone]. Please assist. Thank you.

## 2019-03-24 NOTE — Telephone Encounter (Signed)
Orders faxed to the [facility]. Patient aware.

## 2019-03-25 ENCOUNTER — Inpatient Hospital Stay: Admit: 2019-03-25 | Discharge: 2019-03-25 | Payer: MEDICARE | Primary: Family Medicine

## 2019-03-25 DIAGNOSIS — N390 Urinary tract infection, site not specified: Secondary | ICD-10-CM

## 2019-03-25 LAB — CBC WITH AUTOMATED DIFF
BASOPHILS: 0.2 % (ref 0–3)
EOSINOPHILS: 1.7 % (ref 0–5)
HCT: 35.3 % — ABNORMAL LOW (ref 37.0–50.0)
HGB: 11 gm/dl — ABNORMAL LOW (ref 12.4–17.2)
IMMATURE GRANULOCYTES: 0.2 % (ref 0.0–3.0)
LYMPHOCYTES: 28.2 % (ref 28–48)
MCH: 27.8 pg (ref 23.0–34.6)
MCHC: 31.2 gm/dl (ref 30.0–36.0)
MCV: 89.1 fL (ref 80.0–98.0)
MONOCYTES: 8.7 % (ref 1–13)
MPV: 12.2 fL — ABNORMAL HIGH (ref 6.0–10.0)
NEUTROPHILS: 61 % (ref 34–64)
NRBC: 0 (ref 0–0)
PLATELET: 181 10*3/uL (ref 140–450)
RBC: 3.96 M/uL (ref 3.80–5.70)
RDW-SD: 64.2 — ABNORMAL HIGH (ref 35.1–43.9)
WBC: 5.2 10*3/uL (ref 4.0–11.0)

## 2019-03-25 LAB — CBC WITH AUTO DIFFERENTIAL
Basophils %: 0.2 % (ref 0–3)
Eosinophils %: 1.7 % (ref 0–5)
Hematocrit: 35.3 % — ABNORMAL LOW (ref 37.0–50.0)
Hemoglobin: 11 gm/dl — ABNORMAL LOW (ref 12.4–17.2)
Immature Granulocytes: 0.2 % (ref 0.0–3.0)
Lymphocytes %: 28.2 % (ref 28–48)
MCH: 27.8 pg (ref 23.0–34.6)
MCHC: 31.2 gm/dl (ref 30.0–36.0)
MCV: 89.1 fL (ref 80.0–98.0)
MPV: 12.2 fL — ABNORMAL HIGH (ref 6.0–10.0)
Monocytes %: 8.7 % (ref 1–13)
Neutrophils %: 61 % (ref 34–64)
Nucleated RBCs: 0 (ref 0–0)
Platelets: 181 10*3/uL (ref 140–450)
RBC: 3.96 M/uL (ref 3.80–5.70)
RDW-SD: 64.2 — ABNORMAL HIGH (ref 35.1–43.9)
WBC: 5.2 10*3/uL (ref 4.0–11.0)

## 2019-03-25 NOTE — Progress Notes (Signed)
pls  tell him urine c/s  maybe contaminated.... if still sx'ic  needs to repeat it

## 2019-03-26 LAB — CULTURE, URINE
CULTURE RESULT: 10000 — AB
Culture result: 10000 — AB

## 2019-03-26 NOTE — Telephone Encounter (Addendum)
Patient reports that he is voiding well. He does not feel that he needs a repeat at this time. No other concerns.    ----- Message from Retia Passe, MD sent at 03/26/2019  2:43 PM EST -----  pls  tell him urine c/s  maybe contaminated.... if still sx'ic  needs to repeat it

## 2019-03-26 NOTE — Progress Notes (Signed)
pls  tell him urine c/s  maybe contaminated.... if still sx'ic  needs to repeat it

## 2019-03-28 LAB — TESTOSTERONE
TESTOSTERONE, TOTAL, LC/MS, 070036: 567 ng/dL (ref 250–1100)
TESTOSTERONE, TOTAL, LC/MS: 567 ng/dL (ref 250–1100)

## 2019-04-05 ENCOUNTER — Encounter: Attending: Nurse Practitioner | Primary: Family Medicine

## 2019-04-12 ENCOUNTER — Encounter: Primary: Family Medicine

## 2019-05-03 ENCOUNTER — Ambulatory Visit: Attending: Urology | Primary: Family Medicine

## 2019-05-03 ENCOUNTER — Ambulatory Visit: Admit: 2019-05-03 | Discharge: 2019-05-03 | Attending: Urology | Primary: Family Medicine

## 2019-05-03 DIAGNOSIS — E291 Testicular hypofunction: Secondary | ICD-10-CM

## 2019-05-03 LAB — AMB POC URINALYSIS DIP STICK AUTO W/O MICRO
Bilirubin (UA POC): NEGATIVE
Bilirubin, Urine, POC: NEGATIVE
Blood (UA POC): NEGATIVE
Blood (UA POC): NEGATIVE
Glucose (UA POC): NEGATIVE
Glucose, Urine, POC: NEGATIVE
Ketones (UA POC): NEGATIVE
Ketones, Urine, POC: NEGATIVE
Leukocyte Esterase, Urine, POC: NEGATIVE
Leukocyte esterase (UA POC): NEGATIVE
Nitrite, Urine, POC: NEGATIVE
Nitrites (UA POC): NEGATIVE
Protein (UA POC): NEGATIVE
Protein, Urine, POC: NEGATIVE
Specific Gravity, Urine, POC: 1.03 NA (ref 1.001–1.035)
Specific gravity (UA POC): 1.03 (ref 1.001–1.035)
Urobilinogen (UA POC): 0.2 (ref 0.2–1)
Urobilinogen, POC: 0.2 (ref 0.2–1)
pH (UA POC): 5.5 (ref 4.6–8.0)
pH, Urine, POC: 5.5 NA (ref 4.6–8.0)

## 2019-05-03 NOTE — Progress Notes (Signed)
Tommy Brown  Nov 05, 1945  Encounter date: 05/03/2019        Follow-up Visit    Encounter Diagnoses     ICD-10-CM ICD-9-CM   1. 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 Kidney stone  N20.0 592.0        ASSESSMENT & PLAN:   1.  Hypogonadism, Previously managed by Dr.  Daralene Milch, MD    Last Testosterone level was 567 ng/dL on 03/25/19.    CBC 03/25/19 reviewed with patient.    Continue TC 0.5 mL injections q 2 weeks as managed by PCP.       2. Kidney stone    S/p URS with LL in 02/2017   - Left 5mm proximal ureteral stone with hydroureteronephrosis and 28mm LLP stone  ????????????????????????S/p cystoscopy, left JJ stent on 06/22/2018 by Dr. Claiborne Brown    S/p Cystoscopy, left retrograde pyelogram, left ureteroscopy, holmium laser lithotripsy of proximal ureteral stone and extraction of left renal calculi and left double-J ureteral stent exchange on 07/31/18.   KUB  08/06/18: Left ureteral stent in good position. No ureteral or renal stone seen.     String stent removed on 08/06/18    Advised pt to do 24 hr urine study to assess stone formation etiologies     3. ED s/p IPP placement.     4. Hx of Pancreatic cancer s/p Whipple in 2006, chemotherapy and radiation??  ??  5. Prostate Cancer Screening    Most recent PSA 04/30/18: 0.6 ng/mL    DRE today 05/03/19: 15-20 grams, benign       RTC in 9 months; sooner if difficulty.   All questions answered.      Chief Complaint   Patient presents with   ??? Hypogonadism     Pt here for F/U.        HPI: Tommy Brown is a 74 y.o. WHITE male  who presents today in follow up for an established diagnosis of hypogonadism. Pt was last seen on 08/06/18 fr kidney stone. Pt is s/p Cystoscopy, left retrograde pyelogram, left ureteroscopy, holmium laser lithotripsy of proximal ureteral stone and extraction of left renal calculi and left double-J ureteral stent exchange on 07/31/18. Stent was removed on 08/06/18      Previously on TC injections q 2 weeks via his PCP. He was switched to Androgel during his last office visit, but his insurance would not cover the medication. Currently injecting 0.5 mL TC q 2 weeks. Overall happy with this management. Denies flank pain, gross hematuria, dysuria, asymptomatic for infection. No f/c/n/v.       S/p IPP placement. Not functional. Not sexually active at this time.        H/o pancreatic cancer.     LABS / IMAGING:    Lab Results   Component Value Date/Time    Prostate Specific Ag 0.6 04/30/2018    Prostate Specific Ag 0.5 08/25/2013       AUA Assessment Score:   ;    AUA Bother Rating:      Current Outpatient Medications   Medication Sig Dispense Refill   ??? bimatoprost (Lumigan) 0.01 % ophthalmic drops Lumigan 0.01 % eye drops     ??? promethazine (PHENERGAN) 12.5 mg tablet promethazine 12.5 mg tablet   TAKE 1 TABLET BY MOUTH 2 TIMES PER DAY AS NEEDED     ??? testosterone cypionate (DEPOTESTOTERONE CYPIONATE) 200 mg/mL injection INJECT 0.5 MILLILITER INTO THE MUSCLE EVERY 2 WEEKS DISCARD VIAL AFTER  USE     ??? metoprolol succinate (TOPROL-XL) 25 mg XL tablet TAKE 1 TABLET BY MOUTH EVERY DAY     ??? lipase-protease-amylase (Creon) 24,000-76,000 -120,000 unit capsule Take  by mouth.     ??? temazepam (RESTORIL) 7.5 mg capsule Take  by mouth nightly.     ??? tamsulosin (Flomax) 0.4 mg capsule Take 2 Caps by mouth daily. 60 Cap 1   ??? peg 400-propylene glycol (Systane, propylene glycol,) 0.4-0.3 % drop Administer 2 Drops to both eyes daily. Patient places two drops in both eyes every AM.   Indications: dry eye     ??? gabapentin (NEURONTIN) 600 mg tablet Take 600 mg by mouth two (2) times a day.     ??? amLODIPine (NORVASC) 5 mg tablet Take 5 mg by mouth daily.     ??? allopurinol (ZYLOPRIM) 300 mg tablet Take  by mouth daily.     ??? aspirin 81 mg chewable tablet Take 81 mg by mouth daily.      ??? testosterone (ANDROGEL) 20.25 mg/1.25 gram (1.62 %) gel Apply 20 mg to affected area daily. Max Daily Amount: 20 mg. 6 Bottle 2   ??? lactobacillus rhamnosus gg 10 billion cell (Culturelle) 10 billion cell capsule Take 1 Cap by mouth Before breakfast and dinner. 40 Cap 0       All:  Allergies   Allergen Reactions   ??? Other Food Other (comments)     VISTAID  ANESTHESIA   ??? Codeine Itching and Swelling   ??? Oxycodone Anaphylaxis   ??? Hydroxyzine Pamoate Itching and Other (comments)     Other reaction(s): Unknown     ??? Meperidine Itching, Palpitations, Swelling and Other (comments)     Other reaction(s): Unknown     ??? Metoclopramide Itching, Other (comments) and Swelling     Other reaction(s): Other (See Comments)  Reaction unknown   Reaction unknown      ??? Morphine Other (comments) and Nausea and Vomiting   ??? Oxycodone-Acetaminophen Itching   ??? Prochlorperazine Itching, Other (comments) and Unknown (comments)     Other reaction(s): Unknown  Unsure of reaction     ??? Hydroxyzine Hcl Hives       Past Medical History:   Diagnosis Date   ??? Arthritis    ??? Benign prostate hyperplasia    ??? Bladder infection    ??? Chronic kidney disease    ??? Coronary arteriosclerosis    ??? Coronary atherosclerosis of artery bypass graft    ??? Decreased testosterone level    ??? Diverticular disease    ??? Essential hypertension    ??? Gout    ??? History of acute renal failure    ??? History of anemia    ??? History of malignant neoplasm of pancreas    ??? Hx of CABG    ??? Kidney stone    ??? Kidney stones    ??? Neuropathy    ??? Pancreatic cancer ([place])    ??? Sepsis ([place])    ??? Tremor        Past Surgical History:   Procedure Laterality Date   ??? HX CERVICAL FUSION     ??? HX COLONOSCOPY     ??? HX CORONARY ARTERY BYPASS GRAFT  1995   ??? HX HEART CATHETERIZATION  2006   ??? HX ORTHOPAEDIC      back   ??? HX OTHER SURGICAL  2006    whipple procedure done for pancreatic cancer   ??? HX UROLOGICAL  2012  Percutaneous extraction of a kidney stone w/ fragmentation procedure         Review of Systems  Constitutional: Fever: No  Skin: Rash: No  HEENT: Hearing difficulty: No  Eyes: Blurred vision: No  Cardiovascular: Chest pain: No  Respiratory: Shortness of breath: No  Gastrointestinal: Nausea/vomiting: No  Musculoskeletal: Back pain: No  Neurological: Weakness: No  Psychological: Memory loss: No  Comments/additional findings:     Any elements of the PMFSHx, ROS, or preliminary elements of the HPI that were entered by a medical assistant have been reviewed in full.    PHYSICAL EXAM:    Visit Vitals  Ht 5\' 6"  (1.676 m)   Wt 135 lb (61.2 kg)   BMI 21.79 kg/m??     Constitutional: WDWN, Pleasant and appropriate affect, No acute distress.    CV:  No peripheral swelling noted  Respiratory: No respiratory distress or difficulties  Abdomen:  No abdominal masses or tenderness.  No CVA tenderness. No hernias noted.   GU Male 05/03/19  [id number] normal to visual inspection, no erythema or irritation, Sphincter with good tone, Rectum with no hemorrhoids, fissures or masses.  Prostate is 15-20 grams, smooth, no nodule.   SCROTUM:  No scrotal rash or lesions noticed.  Normal bilateral testes and epididymis.   PENIS: Urethral meatus normal in location and size. No urethral discharge. IPP in place.   Skin: No jaundice.    Neuro/Psych:  Alert and oriented x 3. Affect appropriate.   Lymphatic:   No enlarged inguinal lymph nodes.        Body mass index is 21.79 kg/m??. Patient's BMI is out of the normal parameters.  Information about BMI was given and patient was advised to follow-up with their PCP for further management.     Labs Today:  Results for orders placed or performed in visit on 05/03/19   AMB POC URINALYSIS DIP STICK AUTO W/O MICRO   Result Value Ref Range    Color (UA POC) Yellow     Clarity (UA POC) Clear     Glucose (UA POC) Negative Negative    Bilirubin (UA POC) Negative Negative    Ketones (UA POC) Negative Negative    Specific gravity (UA POC) 1.030 1.001 - 1.035     Blood (UA POC) Negative Negative    pH (UA POC) 5.5 4.6 - 8.0    Protein (UA POC) Negative Negative    Urobilinogen (UA POC) 0.2 mg/dL 0.2 - 1    Nitrites (UA POC) Negative Negative    Leukocyte esterase (UA POC) Negative Negative        Retia Passe, M.D., F.A.C.S.    Medical documentation is provided with the assistance of Cherlyn Cushing, Medical Scribe for [employer].

## 2019-05-03 NOTE — Progress Notes (Signed)
Progress Notes by Retia Passe, MD at 05/03/19 0915                Author: Retia Passe, MD  Service: --  Author Type: Physician       Filed: 05/03/19 1137  Encounter Date: 05/03/2019  Status: Signed          Editor: Retia Passe, MD (Physician)               Tommy Brown   10-05-1945   Encounter date: 05/03/2019              Follow-up Visit        Encounter Diagnoses              ICD-10-CM  ICD-9-CM          1.  Hypogonadism in male   E29.1  257.2          2.  Kidney stone   N20.0  592.0            ASSESSMENT & PLAN:    1.  Hypogonadism, Previously managed by Dr.  Daralene Milch, MD     Last Testosterone level was 567 ng/dL on 03/25/19.     CBC 03/25/19 reviewed with patient.     Continue TC 0.5 mL injections q 2 weeks as managed by PCP.         2. Kidney stone     S/p URS with LL in 02/2017    - Left 79mm proximal ureteral stone with hydroureteronephrosis and 47mm LLP stone   ????????????????????????S/p cystoscopy, left JJ stent on 06/22/2018 by Dr. Claiborne Billings     S/p Cystoscopy, left retrograde pyelogram, left ureteroscopy, holmium laser lithotripsy of proximal ureteral stone and extraction of left renal calculi and left double-J ureteral stent exchange on 07/31/18.    KUB  08/06/18: Left ureteral stent in good position. No ureteral or renal stone seen.      String stent removed on 08/06/18     Advised pt to do 24 hr urine study to assess stone formation etiologies       3. ED s/p IPP placement.       4. Hx of Pancreatic cancer s/p Whipple in 2006, chemotherapy and radiation??   ??   5. Prostate Cancer Screening     Most recent PSA 04/30/18: 0.6 ng/mL     DRE today 05/03/19: 15-20 grams, benign          RTC in 9 months; sooner if difficulty.    All questions answered.          Chief Complaint       Patient presents with        ?  Hypogonadism             Pt here for F/U.            HPI: Tommy Brown is a  74 y.o. WHITE male   who presents today in follow up for an established diagnosis of hypogonadism. Pt  was last seen on 08/06/18 fr kidney stone. Pt is s/p Cystoscopy, left retrograde pyelogram, left ureteroscopy, holmium laser lithotripsy of proximal ureteral stone and extraction  of left renal calculi and left double-J ureteral stent exchange on 07/31/18. Stent was removed on 08/06/18       Previously on TC injections q 2 weeks via his PCP. He was switched to Androgel during his last office visit, but his insurance would not  cover the medication. Currently injecting 0.5 mL TC q 2 weeks. Overall happy with this management. Denies flank pain,  gross hematuria, dysuria, asymptomatic for infection. No f/c/n/v.         S/p IPP placement. Not functional. Not sexually active at this time.           H/o pancreatic cancer.       LABS / IMAGING:        Lab Results         Component  Value  Date/Time            Prostate Specific Ag  0.6  04/30/2018            Prostate Specific Ag  0.5  08/25/2013           AUA Assessment Score:    ;     AUA Bother Rating:          Current Outpatient Medications          Medication  Sig  Dispense  Refill           ?  bimatoprost (Lumigan) 0.01 % ophthalmic drops  Lumigan 0.01 % eye drops         ?  promethazine (PHENERGAN) 12.5 mg tablet  promethazine 12.5 mg tablet    TAKE 1 TABLET BY MOUTH 2 TIMES PER DAY AS NEEDED         ?  testosterone cypionate (DEPOTESTOTERONE CYPIONATE) 200 mg/mL injection  INJECT 0.5 MILLILITER INTO THE MUSCLE EVERY 2 WEEKS DISCARD VIAL AFTER USE         ?  metoprolol succinate (TOPROL-XL) 25 mg XL tablet  TAKE 1 TABLET BY MOUTH EVERY DAY         ?  lipase-protease-amylase (Creon) 24,000-76,000 -120,000 unit capsule  Take  by mouth.         ?  temazepam (RESTORIL) 7.5 mg capsule  Take  by mouth nightly.         ?  tamsulosin (Flomax) 0.4 mg capsule  Take 2 Caps by mouth daily.  60 Cap  1     ?  peg 400-propylene glycol (Systane, propylene glycol,) 0.4-0.3 % drop  Administer 2 Drops to both eyes daily. Patient places two drops in both eyes every AM.   Indications: dry eye          ?  gabapentin (NEURONTIN) 600 mg tablet  Take 600 mg by mouth two (2) times a day.         ?  amLODIPine (NORVASC) 5 mg tablet  Take 5 mg by mouth daily.         ?  allopurinol (ZYLOPRIM) 300 mg tablet  Take  by mouth daily.         ?  aspirin 81 mg chewable tablet  Take 81 mg by mouth daily.         ?  testosterone (ANDROGEL) 20.25 mg/1.25 gram (1.62 %) gel  Apply 20 mg to affected area daily. Max Daily Amount: 20 mg.  6 Bottle  2           ?  lactobacillus rhamnosus gg 10 billion cell (Culturelle) 10 billion cell capsule  Take 1 Cap by mouth Before breakfast and dinner.  40 Cap  0           All:     Allergies        Allergen  Reactions         ?  Other Food  Other (comments)             VISTAID   ANESTHESIA         ?  Codeine  Itching and Swelling     ?  Oxycodone  Anaphylaxis     ?  Hydroxyzine Pamoate  Itching and Other (comments)             Other reaction(s): Unknown            ?  Meperidine  Itching, Palpitations, Swelling and Other (comments)             Other reaction(s): Unknown            ?  Metoclopramide  Itching, Other (comments) and Swelling             Other reaction(s): Other (See Comments)   Reaction unknown    Reaction unknown             ?  Morphine  Other (comments) and Nausea and Vomiting     ?  Oxycodone-Acetaminophen  Itching     ?  Prochlorperazine  Itching, Other (comments) and Unknown (comments)             Other reaction(s): Unknown   Unsure of reaction            ?  Hydroxyzine Hcl  Hives             Past Medical History:        Diagnosis  Date         ?  Arthritis       ?  Benign prostate hyperplasia       ?  Bladder infection       ?  Chronic kidney disease       ?  Coronary arteriosclerosis       ?  Coronary atherosclerosis of artery bypass graft       ?  Decreased testosterone level       ?  Diverticular disease       ?  Essential hypertension       ?  Gout       ?  History of acute renal failure       ?  History of anemia       ?  History of malignant neoplasm of pancreas        ?  Hx of CABG       ?  Kidney stone       ?  Kidney stones       ?  Neuropathy       ?  Pancreatic cancer ([place])       ?  Sepsis ([place])           ?  Tremor               Past Surgical History:         Procedure  Laterality  Date          ?  HX CERVICAL FUSION         ?  HX COLONOSCOPY         ?  HX CORONARY ARTERY BYPASS GRAFT    1995     ?  HX HEART CATHETERIZATION    2006     ?  HX ORTHOPAEDIC              back          ?  HX OTHER SURGICAL    2006          whipple procedure done for pancreatic cancer          ?  HX UROLOGICAL    2012          Percutaneous extraction of a kidney stone w/ fragmentation procedure            Review of Systems   Constitutional: Fever: No   Skin: Rash: No   HEENT: Hearing difficulty: No   Eyes: Blurred vision: No   Cardiovascular: Chest pain: No   Respiratory: Shortness of breath: No   Gastrointestinal: Nausea/vomiting: No   Musculoskeletal: Back pain: No   Neurological: Weakness: No   Psychological: Memory loss: No   Comments/additional findings:       Any elements of the PMFSHx, ROS, or preliminary elements of the HPI that were entered by a medical assistant have been reviewed in full.      PHYSICAL EXAM:     Visit Vitals      Ht  5\' 6"  (1.676 m)     Wt  135 lb (61.2 kg)        BMI  21.79 kg/m??        Constitutional: WDWN, Pleasant and appropriate affect, No acute distress .     CV:  No peripheral swelling noted   Respiratory: No respiratory distress or difficulties   Abdomen:  No abdominal masses or tenderness.   No CVA tenderness. No hernias noted.    GU Male 05/03/19   [id number] normal to visual inspection, no erythema or irritation, Sphincter with good tone, Rectum with  no hemorrhoids, fissures or masses.  Prostate is 15-20 grams, smooth, no nodule.    SCROTUM:  No scrotal rash or lesions noticed.  Normal bilateral testes and epididymis.    PENIS: Urethral meatus normal in location and size. No urethral discharge . IPP in place.    Skin: No jaundice.     Neuro/Psych:  Alert  and o riented x 3. Affect  appropriate.    Lymphatic:   No enlarged inguinal lymph nodes.           Body mass index is 21.79 kg/m??. Patient's BMI is out of the normal parameters.  Information about BMI was given and patient was advised to  follow-up with their PCP for further management.       Labs Today:     Results for orders placed or performed in visit on 05/03/19     AMB POC URINALYSIS DIP STICK AUTO W/O MICRO         Result  Value  Ref Range            Color (UA POC)  Yellow         Clarity (UA POC)  Clear         Glucose (UA POC)  Negative  Negative       Bilirubin (UA POC)  Negative  Negative       Ketones (UA POC)  Negative  Negative       Specific gravity (UA POC)  1.030  1.001 - 1.035       Blood (UA POC)  Negative  Negative       pH (UA POC)  5.5  4.6 - 8.0       Protein (UA POC)  Negative  Negative       Urobilinogen (UA POC)  0.2 mg/dL  0.2 - 1  Nitrites (UA POC)  Negative  Negative            Leukocyte esterase (UA POC)  Negative  Negative            Retia Passe, M.D., F.A.C.S.      Medical documentation is provided with the assistance of Cherlyn Cushing, Medical Scribe for [employer].

## 2019-05-26 ENCOUNTER — Encounter

## 2019-06-03 ENCOUNTER — Inpatient Hospital Stay: Admit: 2019-06-03 | Payer: MEDICARE | Attending: Gastroenterology | Primary: Family Medicine

## 2019-06-03 DIAGNOSIS — R7989 Other specified abnormal findings of blood chemistry: Secondary | ICD-10-CM

## 2019-07-21 ENCOUNTER — Inpatient Hospital Stay
Admit: 2019-07-21 | Discharge: 2019-07-21 | Disposition: A | Discharge status: Home / Self Care | Payer: MEDICARE | Attending: Emergency Medicine

## 2019-07-21 ENCOUNTER — Emergency Department: Admit: 2019-07-21 | Payer: MEDICARE | Primary: Family Medicine

## 2019-07-21 DIAGNOSIS — R079 Chest pain, unspecified: Secondary | ICD-10-CM

## 2019-07-21 LAB — EKG, 12 LEAD, INITIAL
Atrial Rate: 77 {beats}/min
Calculated P Axis: 4 degrees
Calculated R Axis: -40 degrees
Calculated T Axis: 55 degrees
Diagnosis: NORMAL
P-R Interval: 174 ms
Q-T Interval: 374 ms
QRS Duration: 82 ms
QTC Calculation (Bezet): 423 ms
Ventricular Rate: 77 {beats}/min

## 2019-07-21 LAB — METABOLIC PANEL, BASIC
Anion gap: 8 mmol/L (ref 5–15)
BUN: 21 mg/dl (ref 7–25)
CO2: 24 mEq/L (ref 21–32)
Calcium: 9.1 mg/dl (ref 8.5–10.1)
Chloride: 117 mEq/L — ABNORMAL HIGH (ref 98–107)
Creatinine: 1.4 mg/dl — ABNORMAL HIGH (ref 0.6–1.3)
GFR est AA: >60
GFR est non-AA: 52
Glucose: 104 mg/dl (ref 74–106)
Potassium: 4.2 mEq/L (ref 3.5–5.1)
Sodium: 148 mEq/L — ABNORMAL HIGH (ref 136–145)

## 2019-07-21 LAB — CBC WITH AUTOMATED DIFF
BASOPHILS: 0.2 % (ref 0–3)
EOSINOPHILS: 2.2 % (ref 0–5)
HCT: 43.3 % (ref 37.0–50.0)
HGB: 13.8 gm/dl (ref 12.4–17.2)
IMMATURE GRANULOCYTES: 0.2 % (ref 0.0–3.0)
LYMPHOCYTES: 28.4 % (ref 28–48)
MCH: 29.7 pg (ref 23.0–34.6)
MCHC: 31.9 gm/dl (ref 30.0–36.0)
MCV: 93.1 fL (ref 80.0–98.0)
MONOCYTES: 7.5 % (ref 1–13)
MPV: 12.3 fL — ABNORMAL HIGH (ref 6.0–10.0)
NEUTROPHILS: 61.5 % (ref 34–64)
NRBC: 0 (ref 0–0)
PLATELET: 160 10*3/uL (ref 140–450)
RBC: 4.65 M/uL (ref 3.80–5.70)
RDW-SD: 54.4 — ABNORMAL HIGH (ref 35.1–43.9)
WBC: 5 10*3/uL (ref 4.0–11.0)

## 2019-07-21 LAB — NT-PRO BNP: NT pro-BNP: 114 pg/ml (ref 0.0–450.0)

## 2019-07-21 LAB — D DIMER: D DIMER: 0.78 ug/mL (FEU) — ABNORMAL HIGH (ref 0.01–0.50)

## 2019-07-21 LAB — TROPONIN I
Troponin-I: <0.015 ng/ml (ref 0.000–0.045)
Troponin-I: <0.015 ng/ml (ref 0.000–0.045)

## 2019-07-21 LAB — EKG 12-LEAD
Atrial Rate: 77 {beats}/min
Diagnosis: NORMAL
P Axis: 4 degrees
P-R Interval: 174 ms
Q-T Interval: 374 ms
QRS Duration: 82 ms
QTc Calculation (Bazett): 423 ms
R Axis: -40 degrees
T Axis: 55 degrees
Ventricular Rate: 77 {beats}/min

## 2019-07-21 LAB — CBC WITH AUTO DIFFERENTIAL
Basophils %: 0.2 % (ref 0–3)
Eosinophils %: 2.2 % (ref 0–5)
Hematocrit: 43.3 % (ref 37.0–50.0)
Hemoglobin: 13.8 gm/dl (ref 12.4–17.2)
Immature Granulocytes: 0.2 % (ref 0.0–3.0)
Lymphocytes %: 28.4 % (ref 28–48)
MCH: 29.7 pg (ref 23.0–34.6)
MCHC: 31.9 gm/dl (ref 30.0–36.0)
MCV: 93.1 fL (ref 80.0–98.0)
MPV: 12.3 fL — ABNORMAL HIGH (ref 6.0–10.0)
Monocytes %: 7.5 % (ref 1–13)
Neutrophils %: 61.5 % (ref 34–64)
Nucleated RBCs: 0 (ref 0–0)
Platelets: 160 10*3/uL (ref 140–450)
RBC: 4.65 M/uL (ref 3.80–5.70)
RDW-SD: 54.4 — ABNORMAL HIGH (ref 35.1–43.9)
WBC: 5 10*3/uL (ref 4.0–11.0)

## 2019-07-21 LAB — BASIC METABOLIC PANEL
Anion Gap: 8 mmol/L (ref 5–15)
BUN: 21 mg/dl (ref 7–25)
CO2: 24 mEq/L (ref 21–32)
Calcium: 9.1 mg/dl (ref 8.5–10.1)
Chloride: 117 mEq/L — ABNORMAL HIGH (ref 98–107)
Creatinine: 1.4 mg/dl — ABNORMAL HIGH (ref 0.6–1.3)
EGFR IF NonAfrican American: 52
GFR African American: >60
Glucose: 104 mg/dl (ref 74–106)
Potassium: 4.2 mEq/L (ref 3.5–5.1)
Sodium: 148 mEq/L — ABNORMAL HIGH (ref 136–145)

## 2019-07-21 LAB — PROBNP, N-TERMINAL: BNP: 114 pg/ml (ref 0.0–450.0)

## 2019-07-21 LAB — TROPONIN
Troponin I: <0.015 ng/ml (ref 0.000–0.045)
Troponin I: <0.015 ng/ml (ref 0.000–0.045)

## 2019-07-21 LAB — D-DIMER, QUANTITATIVE: D-Dimer, Quant: 0.78 ug/mL (FEU) — ABNORMAL HIGH (ref 0.01–0.50)

## 2019-07-21 MED ORDER — ONDANSETRON (PF) 4 MG/2 ML INJECTION
4 mg/2 mL | INTRAMUSCULAR | Status: DC | PRN
Start: 2019-07-21 — End: 2019-07-23

## 2019-07-21 MED ORDER — METOPROLOL SUCCINATE SR 25 MG 24 HR TAB
25 mg | Freq: Every day | ORAL | Status: DC
Start: 2019-07-21 — End: 2019-07-23
  Administered 2019-07-23: 13:00:00 via ORAL

## 2019-07-21 MED ORDER — FAMOTIDINE 20 MG TAB
20 mg | Freq: Two times a day (BID) | ORAL | Status: DC
Start: 2019-07-21 — End: 2019-07-23
  Administered 2019-07-21 – 2019-07-23 (×4): via ORAL

## 2019-07-21 MED ORDER — IOPAMIDOL 61 % IV SOLN
61 % | Freq: Once | INTRAVENOUS | Status: AC
Start: 2019-07-21 — End: 2019-07-21
  Administered 2019-07-21: 16:00:00 via INTRAVENOUS

## 2019-07-21 MED ORDER — ACETAMINOPHEN 650 MG RECTAL SUPPOSITORY
650 mg | RECTAL | Status: DC | PRN
Start: 2019-07-21 — End: 2019-07-23

## 2019-07-21 MED ORDER — ACETAMINOPHEN (TYLENOL) SOLUTION 32MG/ML
ORAL | Status: DC | PRN
Start: 2019-07-21 — End: 2019-07-23

## 2019-07-21 MED ORDER — PHARMACY INFORMATION NOTE
Freq: Once | Status: AC
Start: 2019-07-21 — End: 2019-07-21
  Administered 2019-07-21: 22:00:00

## 2019-07-21 MED ORDER — GABAPENTIN 300 MG CAP
300 mg | Freq: Two times a day (BID) | ORAL | Status: DC
Start: 2019-07-21 — End: 2019-07-23
  Administered 2019-07-22 – 2019-07-23 (×3): via ORAL

## 2019-07-21 MED ORDER — ENOXAPARIN 40 MG/0.4 ML SUB-Q SYRINGE
40 mg/0.4 mL | SUBCUTANEOUS | Status: DC
Start: 2019-07-21 — End: 2019-07-23
  Administered 2019-07-21 – 2019-07-22 (×2): via SUBCUTANEOUS

## 2019-07-21 MED ORDER — ASPIRIN 81 MG CHEWABLE TAB
81 mg | ORAL | Status: AC
Start: 2019-07-21 — End: 2019-07-21
  Administered 2019-07-21: 16:00:00 via ORAL

## 2019-07-21 MED ORDER — ACETAMINOPHEN 325 MG TABLET
325 mg | ORAL | Status: DC | PRN
Start: 2019-07-21 — End: 2019-07-23

## 2019-07-21 MED ORDER — TAMSULOSIN SR 0.4 MG 24 HR CAP
0.4 mg | Freq: Every day | ORAL | Status: DC
Start: 2019-07-21 — End: 2019-07-23
  Administered 2019-07-23: 13:00:00 via ORAL

## 2019-07-21 MED ORDER — NALOXONE 0.4 MG/ML INJECTION
0.4 mg/mL | INTRAMUSCULAR | Status: DC | PRN
Start: 2019-07-21 — End: 2019-07-23

## 2019-07-21 MED ORDER — AMLODIPINE 5 MG TAB
5 mg | Freq: Every day | ORAL | Status: DC
Start: 2019-07-21 — End: 2019-07-23
  Administered 2019-07-23: 13:00:00 via ORAL

## 2019-07-21 MED ORDER — ALLOPURINOL 300 MG TAB
300 mg | Freq: Every day | ORAL | Status: DC
Start: 2019-07-21 — End: 2019-07-23
  Administered 2019-07-23: 13:00:00 via ORAL

## 2019-07-21 MED ORDER — ASPIRIN 81 MG CHEWABLE TAB
81 mg | Freq: Every day | ORAL | Status: DC
Start: 2019-07-21 — End: 2019-07-23
  Administered 2019-07-23: 13:00:00 via ORAL

## 2019-07-21 MED FILL — ASPIRIN 81 MG CHEWABLE TAB: 81 mg | ORAL | Qty: 4

## 2019-07-21 MED FILL — PHARMACY INFORMATION NOTE: Qty: 1

## 2019-07-21 MED FILL — ENOXAPARIN 40 MG/0.4 ML SUB-Q SYRINGE: 40 mg/0.4 mL | SUBCUTANEOUS | Qty: 0.4

## 2019-07-21 MED FILL — ISOVUE-300  61 % INTRAVENOUS SOLUTION: 300 mg iodine /mL (61 %) | INTRAVENOUS | Qty: 80

## 2019-07-21 NOTE — Other (Signed)
Bedside shift change report given to Temitope A. Marcello Moores, RN (Soil scientist) by Orvil Feil RN (offgoing nurse). Report included the following information SBAR, Kardex, Recent Results and Cardiac Rhythm SR.

## 2019-07-21 NOTE — ED Notes (Signed)
Patient given a dinner tray.

## 2019-07-21 NOTE — H&P (Signed)
Admission History and Physical      Tommy KREMPASKY Sr.  11-23-1945  [id number]  PCP: Daralene Milch, MD    Chief Complaint   Patient presents with   ??? Chest Pain       HPI:      Tommy GOHN Sr. is a 74 y.o. male with a history of CAD status post CABG in 1995, hypertension, gout presented to hospital with complaint of chest pain.  Patient says off-and-on chest pain going on for few weeks.  Has worse episode yesterday.  Pain was in the left side.  8 out of 10.  With some radiation to left arm.  No particular aggravating factor.  Pain lasted over 2 hours.  He went to sleep with some improvement.  Woke up this morning and chest still not back to normal.  He went to see his doctor for testosterone injection and was referred to ER because of the chest pain.  Patient said he has not seen his cardiologist more than a year.  Denies any current shortness of breath.  Denies any fever.  No cough.       Past Medical History:   Diagnosis Date   ??? Arthritis    ??? Benign prostate hyperplasia    ??? Bladder infection    ??? Chronic kidney disease    ??? Coronary arteriosclerosis    ??? Coronary atherosclerosis of artery bypass graft    ??? Decreased testosterone level    ??? Diverticular disease    ??? Essential hypertension    ??? Gout    ??? History of acute renal failure    ??? History of anemia    ??? History of malignant neoplasm of pancreas    ??? Hx of CABG    ??? Kidney stone    ??? Kidney stones    ??? Neuropathy    ??? Pancreatic cancer ([place])    ??? Sepsis ([place])    ??? Tremor      Past Surgical History:   Procedure Laterality Date   ??? HX CERVICAL FUSION     ??? HX COLONOSCOPY     ??? HX CORONARY ARTERY BYPASS GRAFT  1995   ??? HX HEART CATHETERIZATION  2006   ??? HX ORTHOPAEDIC      back   ??? HX OTHER SURGICAL  2006    whipple procedure done for pancreatic cancer   ??? HX UROLOGICAL  2012    Percutaneous extraction of a kidney stone w/ fragmentation procedure      Social History     Socioeconomic History   ??? Marital status: MARRIED     Spouse name: Not on  file   ??? Number of children: Not on file   ??? Years of education: Not on file   ??? Highest education level: Not on file   Occupational History   ??? Not on file   Tobacco Use   ??? Smoking status: Never Smoker   ??? Smokeless tobacco: Never Used   Substance and Sexual Activity   ??? Alcohol use: Not Currently   ??? Drug use: Not Currently   ??? Sexual activity: Not Currently   Other Topics Concern   ??? Not on file   Social History Narrative    ** Merged History Encounter **          Social Determinants of Health     Financial Resource Strain:    ??? Difficulty of Paying Living Expenses:    Food Insecurity:    ???  Worried About Charity fundraiser in the Last Year:    ??? Arboriculturist in the Last Year:    Transportation Needs:    ??? Film/video editor (Medical):    ??? Lack of Transportation (Non-Medical):    Physical Activity:    ??? Days of Exercise per Week:    ??? Minutes of Exercise per Session:    Stress:    ??? Feeling of Stress :    Social Connections:    ??? Frequency of Communication with Friends and Family:    ??? Frequency of Social Gatherings with Friends and Family:    ??? Attends Religious Services:    ??? Marine scientist or Organizations:    ??? Attends Music therapist:    ??? Marital Status:    Intimate Production manager Violence:    ??? Fear of Current or Ex-Partner:    ??? Emotionally Abused:    ??? Physically Abused:    ??? Sexually Abused:      Family History   Problem Relation Age of Onset   ??? Alcohol abuse Father    ??? Heart Disease Brother    ??? Emphysema Brother      Allergies   Allergen Reactions   ??? Other Food Other (comments)     VISTAID  ANESTHESIA   ??? Codeine Itching and Swelling   ??? Oxycodone Anaphylaxis   ??? Hydroxyzine Pamoate Itching and Other (comments)     Other reaction(s): Unknown     ??? Meperidine Itching, Palpitations, Swelling and Other (comments)     Other reaction(s): Unknown     ??? Metoclopramide Itching, Other (comments) and Swelling     Other reaction(s): Other (See Comments)  Reaction unknown   Reaction  unknown      ??? Morphine Other (comments) and Nausea and Vomiting   ??? Oxycodone-Acetaminophen Itching   ??? Prochlorperazine Itching, Other (comments) and Unknown (comments)     Other reaction(s): Unknown  Unsure of reaction     ??? Hydroxyzine Hcl Hives       Home Medications:     Prior to Admission Medications   Prescriptions Last Dose Informant Patient Reported? Taking?   allopurinol (ZYLOPRIM) 300 mg tablet   Yes No   Sig: Take  by mouth daily.   amLODIPine (NORVASC) 5 mg tablet   Yes No   Sig: Take 5 mg by mouth daily.   aspirin 81 mg chewable tablet   Yes No   Sig: Take 81 mg by mouth daily.   bimatoprost (Lumigan) 0.01 % ophthalmic drops   Yes No   Sig: Lumigan 0.01 % eye drops   gabapentin (NEURONTIN) 600 mg tablet   Yes No   Sig: Take 600 mg by mouth two (2) times a day.   lactobacillus rhamnosus gg 10 billion cell (Culturelle) 10 billion cell capsule   No No   Sig: Take 1 Cap by mouth Before breakfast and dinner.   lipase-protease-amylase (Creon) 24,000-76,000 -120,000 unit capsule   Yes No   Sig: Take  by mouth.   metoprolol succinate (TOPROL-XL) 25 mg XL tablet   Yes No   Sig: TAKE 1 TABLET BY MOUTH EVERY DAY   peg 400-propylene glycol (Systane, propylene glycol,) 0.4-0.3 % drop   Yes No   Sig: Administer 2 Drops to both eyes daily. Patient places two drops in both eyes every AM.   Indications: dry eye   promethazine (PHENERGAN) 12.5 mg tablet   Yes No  Sig: promethazine 12.5 mg tablet   TAKE 1 TABLET BY MOUTH 2 TIMES PER DAY AS NEEDED   tamsulosin (Flomax) 0.4 mg capsule   No No   Sig: Take 2 Caps by mouth daily.   temazepam (RESTORIL) 7.5 mg capsule   Yes No   Sig: Take  by mouth nightly.   testosterone (ANDROGEL) 20.25 mg/1.25 gram (1.62 %) gel   No No   Sig: Apply 20 mg to affected area daily. Max Daily Amount: 20 mg.   testosterone cypionate (DEPOTESTOTERONE CYPIONATE) 200 mg/mL injection   Yes No   Sig: INJECT 0.5 MILLILITER INTO THE MUSCLE EVERY 2 WEEKS DISCARD VIAL AFTER USE       Facility-Administered Medications: None       Review of Systems   Constitutional: Negative for fever, chills, weight loss, malaise/fatigue and diaphoresis.   HENT: Negative for ear pain, congestion and sore throat.   Eyes: Negative for blurred vision, double vision and redness.   Respiratory: Negative for shortness of breath. Negative for cough, hemoptysis, sputum production and wheezing.   Cardiovascular: Chest pain  Gastrointestinal: Negative for heartburn, nausea, abdominal pain, diarrhea, blood in stool and melena.   Genitourinary: Negative for dysuria, urgency, frequency and hematuria.   Musculoskeletal: Negative for myalgias, back pain, joint pain and falls.   Skin: Negative for rash.   Neurological: Negative for dizziness, tingling, tremors, speech change, seizures, loss of consciousness, weakness and headaches.   Endo/Heme/Allergies: Negative for polydipsia. Does not bruise/bleed easily.   Psychiatric/Behavioral: Negative for hallucinations and memory loss. The patient does not have insomnia    Physical Assessment:     Patient Vitals for the past 12 hrs:   Temp Pulse Resp BP SpO2   07/21/19 1102 ??? 69 18 136/75 94 %   07/21/19 1012 ??? 69 12 139/70 95 %   07/21/19 0943 ??? 73 17 ??? 97 %   07/21/19 0852 97.7 ??F (36.5 ??C) 78 18 (!) 171/87 99 %       Constitutional:  oriented to person, place, and time and well-developed, well-nourished, and in no distress.   HENT:   Head: Normocephalic and atraumatic.   Nose: Nose normal.   Mouth/Throat: Oropharynx is clear and moist.   Eyes: Conjunctivae and EOM are normal. Pupils are equal, round, and reactive to light.   Neck: Neck supple.   Cardiovascular: Normal rate, regular rhythm, normal heart sounds and intact distal pulses.   Pulmonary/Chest: Effort normal and breath sounds normal.   Abdominal: Soft. Bowel sounds are normal.   Musculoskeletal: Normal range of motion. No clubbing or cyanosis. Pedal pulses 2+ b/l. ??Capillary refill normal.  Neurological: Alert and oriented  to person, place, and time. Gait normal.   Skin: Skin is warm and dry.   Psychiatric: Memory, affect and judgment normal    Recent Results (from the past 24 hour(s))   CBC WITH AUTOMATED DIFF    Collection Time: 07/21/19  8:42 AM   Result Value Ref Range    WBC 5.0 4.0 - 11.0 1000/mm3    RBC 4.65 3.80 - 5.70 M/uL    HGB 13.8 12.4 - 17.2 gm/dl    HCT 43.3 37.0 - 50.0 %    MCV 93.1 80.0 - 98.0 fL    MCH 29.7 23.0 - 34.6 pg    MCHC 31.9 30.0 - 36.0 gm/dl    PLATELET 160 140 - 450 1000/mm3    MPV 12.3 (H) 6.0 - 10.0 fL    RDW-SD 54.4 (H)  35.1 - 43.9      NRBC 0 0 - 0      IMMATURE GRANULOCYTES 0.2 0.0 - 3.0 %    NEUTROPHILS 61.5 34 - 64 %    LYMPHOCYTES 28.4 28 - 48 %    MONOCYTES 7.5 1 - 13 %    EOSINOPHILS 2.2 0 - 5 %    BASOPHILS 0.2 0 - 3 %   METABOLIC PANEL, BASIC    Collection Time: 07/21/19  8:42 AM   Result Value Ref Range    Sodium 148 (H) 136 - 145 mEq/L    Potassium 4.2 3.5 - 5.1 mEq/L    Chloride 117 (H) 98 - 107 mEq/L    CO2 24 21 - 32 mEq/L    Glucose 104 74 - 106 mg/dl    BUN 21 7 - 25 mg/dl    Creatinine 1.4 (H) 0.6 - 1.3 mg/dl    GFR est AA >60      GFR est non-AA 52      Calcium 9.1 8.5 - 10.1 mg/dl    Anion gap 8 5 - 15 mmol/L   TROPONIN I    Collection Time: 07/21/19  8:42 AM   Result Value Ref Range    Troponin-I <0.015 0.000 - 0.045 ng/ml   NT-PRO BNP    Collection Time: 07/21/19  8:42 AM   Result Value Ref Range    NT pro-BNP 114.0 0.0 - 450.0 pg/ml   D DIMER    Collection Time: 07/21/19  8:43 AM   Result Value Ref Range    D DIMER 0.78 (H) 0.01 - 0.50 ug/mL (FEU)       All Micro Results     None          Imaging:    XR CHEST PA LAT    Result Date: 07/21/2019  INDICATION: Chest pain. TECHNIQUE: PA/lateral Chest Radiograph. COMPARISON: 02/08/2019     IMPRESSION: Heart size upper normal. Patient status post sternotomy. Mild bibasilar atelectatic change. No acute infiltrate.     CT CHEST W CON PE PROTOCOL    Result Date: 07/21/2019  EXAM:  CT CHEST W CON PE PROTOCOL INDICATION: Chest pain, elevated  D-dimer. COMPARISON: 07/21/2019 TECHNIQUE: Multiplanar CT angiography of the chest was performed after administering nonionic intravenous contrast. 3D MIP reformats were performed. All CT exams at this facility use one or more dose reduction techniques including automatic exposure control, mA/kV adjustment per patient's size, or iterative reconstruction technique. FINDINGS: CONTRAST BOLUS: Satisfactory. PULMONARY ARTERIES: No  evidence of pulmonary embolism. LUNGS: Patchy perihilar groundglass and interlobular and intralobular septal thickening compatible with congestive failure. Scattered bilateral micronodules, largest measures approximately 3 mm in the lingula (46 series 4). Bibasilar atelectasis. CENTRAL AIRWAYS: Patent. PLEURA: No pleural effusions. MEDIASTINUM AND HILA: No lymphadenopathy. HEART: Cardiomegaly. No pericardial effusion. Stable postoperative changes of prior CABG. VESSELS: Atherosclerosis. CHEST WALL: Median sternotomy. BONES: No acute fracture or aggressive osseous lesion. UPPER ABDOMEN: Trace pneumobilia, stable in appearance.     IMPRESSION: 1. No pulmonary embolism. 2. Mild congestive failure. WORKSTATION ID: [id number]       Echo Results  (Last 48 hours)    None                 Impression:   Chest pain  History of CAD s/p CABG  Hypertension  Elevated D-dimer PE ruled out  Gout        Plan:   With history of CAD status post CABG presenting with  chest pain.  EKG reviewed no acute ischemia.  CT of the chest negative for PE.  Initial troponin is negative.  We will keep patient on telemetry.  Get serial troponins.  Continue aspirin.  Will order echocardiogram.  I will also order stress test.  Patient elevated dimer was CT negative for PE.  Will monitor blood pressure closely.  Continue Toprol-XL and Norvasc.Ophelia Shoulder medicine as needed.  I will check fasting lipid panel.  Continue Flomax.  Continue allopurinol.  Subcu Lovenox for DVT prophylaxis.  Treatment plan discussed with family at bedside.     Advanced care planning  Had extensive discussion with patient and wife about advanced care planning.  All different scenarios were discussed.  After detailed discussion patient okay to have medication and CPR done in case of cardiopulmonary arrest however he does not want intubation.  Patient wife is at bedside and agrees with patient decision.  Patient also does not want any permanent feeding tube.  Partial CODE STATUS was placed in system.  Time spent in advanced care planning 16-minute.    Dragon medical dictation software was used for portions of this report. Unintended errors may occur.   Clearnce Sorrel, MD  07/21/2019 2:07 PM

## 2019-07-21 NOTE — ED Notes (Signed)
Patient updated on plan of care. No concerns at this time. Will continue to monitor.

## 2019-07-21 NOTE — ED Triage Notes (Signed)
Pt arrives to ED via POV. Pt states he has had L sided chest pain that radiates  Down the L arm x "several weeks". Describes as throbbing and aching. States pain got worse last night. Denies SOB. EKG done in triage

## 2019-07-21 NOTE — Progress Notes (Signed)
Rounding completed on pt  Pt resting comfortably in stretcher in lowest position with call bell within reach while awaiting room upstairs  Wife at bedside, answered all questions pt had and completed CM assessment.

## 2019-07-21 NOTE — ED Provider Notes (Signed)
[facility]  Emergency Department Treatment Report        Patient: Tommy Rutter Sr. Age: 74 y.o. Sex: male    Date of Birth: [birth year] Admit Date: 07/21/2019 PCP: Daralene Milch, MD   MRN: [id number]  CSN: [id number]  Attending:  Serita Sheller, M.D.   Room: ER26/ER26 Time Dictated: 9:38 AM APP:  None     I hereby certify this patient for observation status admission based upon medical necessity as noted below:      Chief Complaint   Chest pain    History of Present Illness   This is a 74 y.o. male who reports several weeks to months of intermittent left-sided chest pain that radiated down his left arm.  He had not mentioned it to his doctor.  Over the last 2 to 3 days it has become constant and worse.  He took Tylenol last night without any relief.  He sees his primary care provider every 2 weeks for testosterone shot.  When he went in for his shot today he mentioned the chest pain to the provider and requested referral to a cardiologist at which point he was promptly directed to the emergency department for further evaluation.  He denies nausea or vomiting or shortness of breath associated with the symptoms.  No diaphoresis.  Its not reproducible with palpation or movement of the upper extremities.  Pain is currently rated at 3 out of 10.  When it was intermittent he did not notice any association with exertion or activity.  It seemed to be "random".  Patient stated that he felt "sick yesterday" he described his symptoms as "dragging and groggy.  No nausea or vomiting."    Review of Systems   Review of Systems   Constitutional: Positive for malaise/fatigue. Negative for diaphoresis, fever and weight loss.   HENT: Negative for ear pain and sore throat.    Eyes: Negative for pain and discharge.   Respiratory: Negative for cough and shortness of breath.    Cardiovascular: Positive for chest pain. Negative for leg swelling.   Gastrointestinal: Negative for abdominal pain, blood in  stool, constipation, diarrhea, nausea and vomiting.   Genitourinary: Negative for dysuria, frequency and urgency.   Musculoskeletal: Negative for back pain, falls and neck pain.   Skin: Negative for rash.   Neurological: Negative for dizziness, loss of consciousness and weakness.   Endo/Heme/Allergies: Does not bruise/bleed easily.   Psychiatric/Behavioral: Negative for memory loss and substance abuse.       Past Medical/Surgical History     Past Medical History:   Diagnosis Date   ??? Arthritis    ??? Benign prostate hyperplasia    ??? Bladder infection    ??? Chronic kidney disease    ??? Coronary arteriosclerosis    ??? Coronary atherosclerosis of artery bypass graft    ??? Decreased testosterone level    ??? Diverticular disease    ??? Essential hypertension    ??? Gout    ??? History of acute renal failure    ??? History of anemia    ??? History of malignant neoplasm of pancreas    ??? Hx of CABG    ??? Kidney stone    ??? Kidney stones    ??? Neuropathy    ??? Pancreatic cancer ([place])    ??? Sepsis ([place])    ??? Tremor      Past Surgical History:   Procedure Laterality Date   ??? HX CERVICAL FUSION     ???  HX COLONOSCOPY     ??? HX CORONARY ARTERY BYPASS GRAFT  1995   ??? HX HEART CATHETERIZATION  2006   ??? HX ORTHOPAEDIC      back   ??? HX OTHER SURGICAL  2006    whipple procedure done for pancreatic cancer   ??? HX UROLOGICAL  2012    Percutaneous extraction of a kidney stone w/ fragmentation procedure      Social History     Social History     Socioeconomic History   ??? Marital status: MARRIED     Spouse name: Not on file   ??? Number of children: Not on file   ??? Years of education: Not on file   ??? Highest education level: Not on file   Occupational History   ??? Not on file   Tobacco Use   ??? Smoking status: Never Smoker   ??? Smokeless tobacco: Never Used   Substance and Sexual Activity   ??? Alcohol use: Not Currently   ??? Drug use: Not Currently   ??? Sexual activity: Not Currently   Other Topics Concern   ??? Not on file   Social History Narrative    ** Merged History  Encounter **          Social Determinants of Health     Financial Resource Strain:    ??? Difficulty of Paying Living Expenses:    Food Insecurity:    ??? Worried About Charity fundraiser in the Last Year:    ??? Arboriculturist in the Last Year:    Transportation Needs:    ??? Film/video editor (Medical):    ??? Lack of Transportation (Non-Medical):    Physical Activity:    ??? Days of Exercise per Week:    ??? Minutes of Exercise per Session:    Stress:    ??? Feeling of Stress :    Social Connections:    ??? Frequency of Communication with Friends and Family:    ??? Frequency of Social Gatherings with Friends and Family:    ??? Attends Religious Services:    ??? Marine scientist or Organizations:    ??? Attends Music therapist:    ??? Marital Status:    Intimate Production manager Violence:    ??? Fear of Current or Ex-Partner:    ??? Emotionally Abused:    ??? Physically Abused:    ??? Sexually Abused:      Family History     Family History   Problem Relation Age of Onset   ??? Alcohol abuse Father    ??? Heart Disease Brother    ??? Emphysema Brother      Current Medications     Prior to Admission Medications   Prescriptions Last Dose Informant Patient Reported? Taking?   allopurinol (ZYLOPRIM) 300 mg tablet   Yes No   Sig: Take  by mouth daily.   amLODIPine (NORVASC) 5 mg tablet   Yes No   Sig: Take 5 mg by mouth daily.   aspirin 81 mg chewable tablet   Yes No   Sig: Take 81 mg by mouth daily.   bimatoprost (Lumigan) 0.01 % ophthalmic drops   Yes No   Sig: Lumigan 0.01 % eye drops   gabapentin (NEURONTIN) 600 mg tablet   Yes No   Sig: Take 600 mg by mouth two (2) times a day.   lactobacillus rhamnosus gg 10 billion cell (Culturelle) 10 billion cell capsule  No No   Sig: Take 1 Cap by mouth Before breakfast and dinner.   lipase-protease-amylase (Creon) 24,000-76,000 -120,000 unit capsule   Yes No   Sig: Take  by mouth.   metoprolol succinate (TOPROL-XL) 25 mg XL tablet   Yes No   Sig: TAKE 1 TABLET BY MOUTH EVERY DAY   peg 400-propylene  glycol (Systane, propylene glycol,) 0.4-0.3 % drop   Yes No   Sig: Administer 2 Drops to both eyes daily. Patient places two drops in both eyes every AM.   Indications: dry eye   promethazine (PHENERGAN) 12.5 mg tablet   Yes No   Sig: promethazine 12.5 mg tablet   TAKE 1 TABLET BY MOUTH 2 TIMES PER DAY AS NEEDED   tamsulosin (Flomax) 0.4 mg capsule   No No   Sig: Take 2 Caps by mouth daily.   temazepam (RESTORIL) 7.5 mg capsule   Yes No   Sig: Take  by mouth nightly.   testosterone (ANDROGEL) 20.25 mg/1.25 gram (1.62 %) gel   No No   Sig: Apply 20 mg to affected area daily. Max Daily Amount: 20 mg.   testosterone cypionate (DEPOTESTOTERONE CYPIONATE) 200 mg/mL injection   Yes No   Sig: INJECT 0.5 MILLILITER INTO THE MUSCLE EVERY 2 WEEKS DISCARD VIAL AFTER USE      Facility-Administered Medications: None        Allergies     Allergies   Allergen Reactions   ??? Other Food Other (comments)     VISTAID  ANESTHESIA   ??? Codeine Itching and Swelling   ??? Oxycodone Anaphylaxis   ??? Hydroxyzine Pamoate Itching and Other (comments)     Other reaction(s): Unknown     ??? Meperidine Itching, Palpitations, Swelling and Other (comments)     Other reaction(s): Unknown     ??? Metoclopramide Itching, Other (comments) and Swelling     Other reaction(s): Other (See Comments)  Reaction unknown   Reaction unknown      ??? Morphine Other (comments) and Nausea and Vomiting   ??? Oxycodone-Acetaminophen Itching   ??? Prochlorperazine Itching, Other (comments) and Unknown (comments)     Other reaction(s): Unknown  Unsure of reaction     ??? Hydroxyzine Hcl Hives       Physical Exam     Patient Vitals for the past 24 hrs:   Temp Pulse Resp BP SpO2   07/21/19 0943 ??? 73 17 ??? 97 %   07/21/19 0852 97.7 ??F (36.5 ??C) 78 18 (!) 171/87 99 %     Physical Exam  Vitals and nursing note reviewed.   Constitutional:       Appearance: He is not ill-appearing, toxic-appearing or diaphoretic.   HENT:      Head: Normocephalic and atraumatic.      Right Ear: External ear  normal.      Left Ear: External ear normal.      Nose: Nose normal. No congestion.      Mouth/Throat:      Mouth: Mucous membranes are moist.      Pharynx: Oropharynx is clear. No oropharyngeal exudate.   Eyes:      General: No scleral icterus.     Conjunctiva/sclera: Conjunctivae normal.   Cardiovascular:      Rate and Rhythm: Normal rate and regular rhythm.      Pulses:           Radial pulses are 2+ on the right side and 2+ on the left side.  Heart sounds: Murmur heard.   Systolic murmur is present with a grade of 3/6.     Pulmonary:      Effort: No respiratory distress.      Breath sounds: Normal breath sounds. No wheezing or rales.   Abdominal:      General: Bowel sounds are normal. There is no distension.      Palpations: Abdomen is soft.      Tenderness: There is no abdominal tenderness. There is no guarding.   Musculoskeletal:         General: No swelling or tenderness.      Cervical back: Normal range of motion and neck supple. No tenderness.   Skin:     General: Skin is warm and dry.      Coloration: Skin is not jaundiced.      Findings: No erythema or rash.   Neurological:      Mental Status: He is alert and oriented to person, place, and time.      GCS: GCS eye subscore is 4. GCS verbal subscore is 5. GCS motor subscore is 6.      Cranial Nerves: No facial asymmetry.      Sensory: Sensation is intact.   Psychiatric:         Mood and Affect: Mood normal.         Speech: Speech normal.         Behavior: Behavior is cooperative.         Thought Content: Thought content normal.          Impression and Management Plan   Mr. Oralia Rud is a 74 year old male with known coronary artery disease and no ischemic work-up since heart catheterization in 2006.  Multivessel coronary disease with coronary bypass graft in 1995.  He is presenting with chest pain that is been episodic for "weeks to months" and has now been constant over the last couple of days.  He does not have a local cardiologist.  His last cardiology  evaluation was in 2019 and reviewed via care everywhere.  Chest Pain Impression: patient with chest pain. Acute ischemic coronary disease must be considered first, and the patient protected against consequences of same, while other etiologies (including infectious, pulmonary, GI and musculoskeletal)  are considered.  Standard chest pain triage labs initiated.  I have added a D-dimer because of the patient's history of cancer and a chest x-ray.    EKG: By my interpretation shows normal sinus rhythm with a rate of 77.  PR interval is 174 ms.  QRS narrow complex 82 ms.  QT interval corrected 423 ms.  Patient has inferior Q waves unchanged from prior EKG 22 June 2018.  No acute ST segment T wave changes concerning for ischemia.    Diagnostic Studies   Lab:   Recent Results (from the past 12 hour(s))   CBC WITH AUTOMATED DIFF    Collection Time: 07/21/19  8:42 AM   Result Value Ref Range    WBC 5.0 4.0 - 11.0 1000/mm3    RBC 4.65 3.80 - 5.70 M/uL    HGB 13.8 12.4 - 17.2 gm/dl    HCT 43.3 37.0 - 50.0 %    MCV 93.1 80.0 - 98.0 fL    MCH 29.7 23.0 - 34.6 pg    MCHC 31.9 30.0 - 36.0 gm/dl    PLATELET 160 140 - 450 1000/mm3    MPV 12.3 (H) 6.0 - 10.0 fL    RDW-SD 54.4 (H) 35.1 -  43.9      NRBC 0 0 - 0      IMMATURE GRANULOCYTES 0.2 0.0 - 3.0 %    NEUTROPHILS 61.5 34 - 64 %    LYMPHOCYTES 28.4 28 - 48 %    MONOCYTES 7.5 1 - 13 %    EOSINOPHILS 2.2 0 - 5 %    BASOPHILS 0.2 0 - 3 %   METABOLIC PANEL, BASIC    Collection Time: 07/21/19  8:42 AM   Result Value Ref Range    Sodium 148 (H) 136 - 145 mEq/L    Potassium 4.2 3.5 - 5.1 mEq/L    Chloride 117 (H) 98 - 107 mEq/L    CO2 24 21 - 32 mEq/L    Glucose 104 74 - 106 mg/dl    BUN 21 7 - 25 mg/dl    Creatinine 1.4 (H) 0.6 - 1.3 mg/dl    GFR est AA >60      GFR est non-AA 52      Calcium 9.1 8.5 - 10.1 mg/dl    Anion gap 8 5 - 15 mmol/L   TROPONIN I    Collection Time: 07/21/19  8:42 AM   Result Value Ref Range    Troponin-I <0.015 0.000 - 0.045 ng/ml       Imaging:    XR CHEST PA  LAT    Result Date: 07/21/2019  INDICATION: Chest pain. TECHNIQUE: PA/lateral Chest Radiograph. COMPARISON: 02/08/2019     IMPRESSION: Heart size upper normal. Patient status post sternotomy. Mild bibasilar atelectatic change. No acute infiltrate.       ED Course/Medical Decision Making   WBC 5.0.  Hemoglobin hematocrit and platelets normal.  Differential shows no left shift.  Sodium mildly elevated at 148.  Potassium normal at 4.2.  Electrolytes otherwise unremarkable.  BUN and creatinine are 21 and 1.4.  Previous creatinines have been 1.5-2.0.  Initial troponin nondetectable less than 0.015.  D-dimer Elevated at 0.78.  Age-adjusted this is close to normal.  CTA chest ordered.    Chest x-ray by my interpretation reveals shows no acute cardiopulmonary process.  I reviewed the radiology report as well.    Review of electronic medical records as follows:    Echocardiogram from 01/19/2018 as follows:  Interpretation Summary  A complete two-dimensional transthoracic echocardiogram was performed (2D, M-  mode, Doppler and color flow Doppler).  The study was technically adequate.  The left ventricle is normal in size, wall thickness, and systolic function.  Ejection fraction is [id number]  Diastolic function is Grade 1.  The right ventricle is grossly normal size, wall thickness and systolic  function.  Mild mitral and pulmonic regurgitation.  Mild aortic stenosis, Max = 86mmHg, Mean = 57mmHg, AVA = 1.6cm2  Right ventricular systolic pressure is 27 mmHg.  ??  No previous echocardiogram available for comparison    Cardiology note reviewed via care everywhere from office visit 06/17/2017.  Summary with plan as follows:    Assessment and Plan:    1. Multivessel CAD status post CABG in 1995. Cardiac catheterization in 2006 revealed patent bypass grafts. He has had no interval ischemic testing and remains comfortable with observation in the absence of progressive angina symptoms on medical therapy. I did review his follow-up ECG.    2.  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 in 2017. Aortic murmur is most prominent. We will obtain a follow-up echocardiogram for surveillance.    3. Essential hypertension,  on Norvasc and Lopressor followed by Dr. Sherrie Sport.    4. Mixed hyperlipidemia, on Lipitor. Requesting most recent lab work from Dr. Sherrie Sport.    Current medicines were reviewed with the patient today.    I reviewed the patient's CTA images myself.  I do not see any evidence of pulmonary embolism.  There seem to be some patchy infiltrates in the posterior bases bilaterally.  Will defer to radiologist read.  Radiology felt there is no evidence of pulmonary embolism but the findings were consistent with mild congestive heart failure.  I have added a proBNP.    With the patient's known history of coronary artery disease and bypass surgery as well as no ischemic work-up since a catheterization in the mid 2000's and CT findings I feel the patient is more complicated than should be placed in our observation unit.  I have therefore, spoken to Dr. Dory Peru and requested observation status admission to telemetry on their service for further evaluation.  Procedures  Final Diagnosis       ICD-10-CM ICD-9-CM   1. Acute chest pain  R07.9 786.50   2. History of coronary artery disease  Z86.79 V12.59       Disposition   Admission observation status to [facility] hospitalist service as discussed with Dr. Dory Peru.      Serita Sheller, M.D.  Jul 21, 2019    My signature above authenticates this document and my orders, the final ??  diagnosis (es), discharge prescription (s), and instructions in the Epic ??  record.  If you have any questions please contact [phone].  ??  Nursing notes have been reviewed by the physician/ advanced practice ??  Clinician.  Dragon medical dictation software was used for portions of this report. Unintended voice recognition errors may occur.

## 2019-07-21 NOTE — ED Notes (Signed)
Patient resting on stretcher, breathing equal and non labored at this time. Will continue to monitor.

## 2019-07-21 NOTE — ED Notes (Signed)
Patient transported to [facility]

## 2019-07-21 NOTE — Progress Notes (Signed)
Patient admitted on 07/21/2019 from [facility] with   Chief Complaint   Patient presents with   ??? Chest Pain          The patient is being treated for    PMH:   Past Medical History:   Diagnosis Date   ??? Arthritis    ??? Benign prostate hyperplasia    ??? Bladder infection    ??? Chronic kidney disease    ??? Coronary arteriosclerosis    ??? Coronary atherosclerosis of artery bypass graft    ??? Decreased testosterone level    ??? Diverticular disease    ??? Essential hypertension    ??? Gout    ??? History of acute renal failure    ??? History of anemia    ??? History of malignant neoplasm of pancreas    ??? Hx of CABG    ??? Kidney stone    ??? Kidney stones    ??? Neuropathy    ??? Pancreatic cancer ([place])    ??? Sepsis ([place])    ??? Tremor         Treatment Team: Treatment Team: Attending Provider: Serita Sheller, MD; Attending Provider: Clearnce Sorrel, MD; Tech: Augustin Schooling Primary Nurse: Alfonse Ras, RN; Primary Nurse: Ginger Organ; Consulting Provider: Clearnce Sorrel, MD      The patient has been admitted to the hospital 1 times in the past 12 months.    Previous 4 Admission Dates Admission and Discharge Diagnosis Interventions Barriers Disposition                                 Patient and Family/Caregivers Goals of Care: [place] [facility]    Caregivers Participating in Plan of Care/Discharge Plan with the patient: WIFE    Tentative dc plan: [facility] [facility]    Anticipated DME needs for discharge: N    PRESCREENING COMPLETED FOR SNF N  Will patient qualify for medicaid now or in the next 180 days? N  Has patient had covid vaccine? Y  Date and type if not in chart under immunization field [place] 3/30        Does the patient have appropriate clothing available to be worn at discharge? YES      The patient and care participants are willing to travel NA area for discharge facility.  The patient and plan of care participants have been provided with a list of all available Rehab Facilities or [place]  agencies as applicable. CM will follow up with a list of facilities or agencies that are offer acceptance.    CM has disclosed any financial interest that [facility] may have with any facility or agency.    Anticipated Discharge Date: 5/27    The plan of care and discharge plan has been discussed with Serita Sheller, MD;Me* and all other appropriate providers and adjusted per interdisciplinary team recommendations and in discussion with the patient and the patient designated Care Plan Participants.    Barriers to Healthcare Success/ Readmission Risk Factors: Htn, hx of cabg    Consults:  Palliative Care Consult Recommended: n  Transitional Care Clinic Referral: n  Transitional Nurse Navigator Referral: n  Oncology Navigator Referral: n  SW consulted: n  [facility] Consulted: n  Outside Hospital/Community Resources Referrals and Collaboration: n    Food/Nutrition Needs:   n  Dietician Consulted: n    RRAT Score: Medium Risk            20 Total Score    3 Has Seen PCP in Last 6 Months (Yes=3, No=0)    2 Married. Living with Significant Other. Assisted Living. LTAC. SNF. or   Rehab    5 Pt. Coverage (Medicare=5 , Medicaid, or Self-Pay=4)    10 Charlson Comorbidity Score (Age + Comorbid Conditions)        Criteria that do not apply:    Patient Length of Stay (>5 days = 3)    IP Visits Last 12 Months (1-3=4, 4=9, >4=11)           PCP: Daralene Milch, MD . How do you get to your doctor appointments? Self or wife    Specialists:      Dialysis Unit: na    Pharmacy: [facility]. Are there any medications that you have trouble paying for? Any difficulty getting your medications? N    DME available at Home:N    Home O2 L Flow:  N            Home O2 Provider: N    Home Environment and Prior Level of Function: Lives at [place] [address] @HOMEPHONE @. Lives with WIFE.  1 story. 2 steps into home. Responsibilities at home include adls     Prior to admission open services: n    [place] n    Extended Emergency Contact Information  Primary Emergency Contact: [facility]  Address: [address]           [place] [address] [place]  Home Phone: [phone]  Mobile Phone: [phone]  Relation: Spouse  Secondary Emergency Contact: [id number]  Home Phone: [phone]  Relation: Spouse     Transportation: wife will transport home    Therapy Recommendations:    OT = n    PT = n    SLP =  n     RT Home O2 Evaluation =  n    Wound Care =  n    Case Management Assessment    ABUSE/NEGLECT SCREENING                          PRIMARY DECISION MAKER                                   CARE MANAGEMENT INTERVENTIONS  DISCHARGE LOCATION

## 2019-07-21 NOTE — Progress Notes (Signed)
Pt. Requesting for restoril 7.5mg  per med. List. Sent a paged to [facility] oncall md.

## 2019-07-21 NOTE — ED Notes (Signed)
TRANSFER - OUT REPORT:    Verbal report given to 2217 RN(name) on Tommy F Gammon Sr.  being transferred to 2217(unit) for routine progression of care       Report consisted of patient???s Situation, Background, Assessment and   Recommendations(SBAR).     Information from the following report(s) SBAR, ED Summary, [facility] and Recent Results was reviewed with the receiving nurse.    Lines:   Peripheral IV 07/21/19 Right Antecubital (Active)   Site Assessment Clean, dry, & intact 07/21/19 0843   Phlebitis Assessment 0 07/21/19 0843   Infiltration Assessment 0 07/21/19 0843   Dressing Status Clean, dry, & intact 07/21/19 0843   Dressing Type Transparent 07/21/19 0843   Hub Color/Line Status Flushed;Patent 07/21/19 [id number]        Opportunity for questions and clarification was provided.      Patient transported with:   [employer]

## 2019-07-21 NOTE — ED Notes (Signed)
 Patient updated on plan of care. No concerns at this time. Will continue to monitor.

## 2019-07-21 NOTE — ED Notes (Signed)
Pt arrives to ED via POV. Pt states he has had L sided chest pain that radiates  Down the L arm x "several weeks". Describes as throbbing and aching. States pain got worse last night. Denies SOB. EKG done in triage

## 2019-07-21 NOTE — Progress Notes (Signed)
 Patient admitted on 07/21/2019 from ED FROM HOME with   Chief Complaint   Patient presents with   . Chest Pain          The patient is being treated for    PMH:   Past Medical History:   Diagnosis Date   . Arthritis    . Benign prostate hyperplasia    . Bladder infection    . Chronic kidney disease    . Coronary arteriosclerosis    . Coronary atherosclerosis of artery bypass graft    . Decreased testosterone level    . Diverticular disease    . Essential hypertension    . Gout    . History of acute renal failure    . History of anemia    . History of malignant neoplasm of pancreas    . Hx of CABG    . Kidney stone    . Kidney stones    . Neuropathy    . Pancreatic cancer (HCC)    . Sepsis (HCC)    . Tremor         Treatment Team: Treatment Team: Attending Provider: Maranda Evalene RAMAN, MD; Attending Provider: Curt Spears, MD; Tech: Arletta Oneil DANISH Primary Nurse: Osa Laymon BRAVO, RN; Primary Nurse: Dara Emmalene Piety; Consulting Provider: Curt Spears, MD      The patient has been admitted to the hospital 1 times in the past 12 months.    Previous 4 Admission Dates Admission and Discharge Diagnosis Interventions Barriers Disposition                                 Patient and Family/Caregivers Goals of Care: [place] CP    Caregivers Participating in Plan of Care/Discharge Plan with the patient: WIFE    Tentative dc plan: HOME WITH OUTPT FOLLOW UP    Anticipated DME needs for discharge: N    PRESCREENING COMPLETED FOR SNF N  Will patient qualify for medicaid now or in the next 180 days? N  Has patient had covid vaccine? Y  Date and type if not in chart under immunization field MODERNA COMPLETED 3/30        Does the patient have appropriate clothing available to be worn at discharge? YES      The patient and care participants are willing to travel NA area for discharge facility.  The patient and plan of care participants have been provided with a list of all available Rehab Facilities or Home Health  agencies as applicable. CM will follow up with a list of facilities or agencies that are offer acceptance.    CM has disclosed any financial interest that [facility] may have with any facility or agency.    Anticipated Discharge Date: 5/27    The plan of care and discharge plan has been discussed with Maranda Evalene RAMAN, MD;Me* and all other appropriate providers and adjusted per interdisciplinary team recommendations and in discussion with the patient and the patient designated Care Plan Participants.    Barriers to Healthcare Success/ Readmission Risk Factors: Htn, hx of cabg    Consults:  Palliative Care Consult Recommended: n  Transitional Care Clinic Referral: n  Transitional Nurse Navigator Referral: n  Oncology Navigator Referral: n  SW consulted: n  [facility] Consulted: n  Outside Hospital/Community Resources Referrals and Collaboration: n    Food/Nutrition Needs:   n  Dietician Consulted: n    RRAT Score: Medium Risk            20 Total Score    3 Has Seen PCP in Last 6 Months (Yes=3, No=0)    2 Married. Living with Significant Other. Assisted Living. LTAC. SNF. or   Rehab    5 Pt. Coverage (Medicare=5 , Medicaid, or Self-Pay=4)    10 Charlson Comorbidity Score (Age + Comorbid Conditions)        Criteria that do not apply:    Patient Length of Stay (>5 days = 3)    IP Visits Last 12 Months (1-3=4, 4=9, >4=11)           PCP: Perfecto Dickey CROME, MD . How do you get to your doctor appointments? Self or wife    Specialists:      Dialysis Unit: na    Pharmacy: [facility] [place]. Are there any medications that you have trouble paying for? Any difficulty getting your medications? N    DME available at Home:N    Home O2 L Flow:  N            Home O2 Provider: N    Home Environment and Prior Level of Function: Lives at [address]  [address]  [place] [address] @HOMEPHONE @. Lives with WIFE.  1 story. 2 steps into home. Responsibilities at home include  adls    Prior to admission open services: n    Home Health Agency n  Personal Care Agency n    Extended Emergency Contact Information  Primary Emergency Contact: [facility]  Address: [address]           [place], [place] [address] [place]  Home Phone: [phone]  Mobile Phone: [phone]  Relation: Spouse  Secondary Emergency Contact: [phone]  Home Phone: [phone]  Relation: Spouse     Transportation: wife will transport home    Therapy Recommendations:    OT = n    PT = n    SLP =  n     RT Home O2 Evaluation =  n    Wound Care =  n    Case Management Assessment    ABUSE/NEGLECT SCREENING                          PRIMARY DECISION MAKER                                   CARE MANAGEMENT INTERVENTIONS  DISCHARGE LOCATION

## 2019-07-21 NOTE — ED Provider Notes (Signed)
[facility]  Emergency Department Treatment Report        Patient: Tommy Rutter Sr. Age: 74 y.o. Sex: male    Date of Birth: 11-18-1945 Admit Date: 07/21/2019 PCP: Daralene Milch, MD   MRN: [id number]  CSN: [id number]  Attending:  Serita Sheller, M.D.   Room: ER26/ER26 Time Dictated: 9:38 AM APP:  None     I hereby certify this patient for observation status admission based upon medical necessity as noted below:      Chief Complaint   Chest pain    History of Present Illness   This is a 74 y.o. male who reports several weeks to months of intermittent left-sided chest pain that radiated down his left arm.  He had not mentioned it to his doctor.  Over the last 2 to 3 days it has become constant and worse.  He took Tylenol last night without any relief.  He sees his primary care provider every 2 weeks for testosterone shot.  When he went in for his shot today he mentioned the chest pain to the provider and requested referral to a cardiologist at which point he was promptly directed to the emergency department for further evaluation.  He denies nausea or vomiting or shortness of breath associated with the symptoms.  No diaphoresis.  Its not reproducible with palpation or movement of the upper extremities.  Pain is currently rated at 3 out of 10.  When it was intermittent he did not notice any association with exertion or activity.  It seemed to be "random".  Patient stated that he felt "sick yesterday" he described his symptoms as "dragging and groggy.  No nausea or vomiting."    Review of Systems   Review of Systems   Constitutional: Positive for malaise/fatigue. Negative for diaphoresis, fever and weight loss.   HENT: Negative for ear pain and sore throat.    Eyes: Negative for pain and discharge.   Respiratory: Negative for cough and shortness of breath.    Cardiovascular: Positive for chest pain. Negative for leg swelling.   Gastrointestinal: Negative for abdominal pain, blood in  stool, constipation, diarrhea, nausea and vomiting.   Genitourinary: Negative for dysuria, frequency and urgency.   Musculoskeletal: Negative for back pain, falls and neck pain.   Skin: Negative for rash.   Neurological: Negative for dizziness, loss of consciousness and weakness.   Endo/Heme/Allergies: Does not bruise/bleed easily.   Psychiatric/Behavioral: Negative for memory loss and substance abuse.       Past Medical/Surgical History     Past Medical History:   Diagnosis Date   ??? Arthritis    ??? Benign prostate hyperplasia    ??? Bladder infection    ??? Chronic kidney disease    ??? Coronary arteriosclerosis    ??? Coronary atherosclerosis of artery bypass graft    ??? Decreased testosterone level    ??? Diverticular disease    ??? Essential hypertension    ??? Gout    ??? History of acute renal failure    ??? History of anemia    ??? History of malignant neoplasm of pancreas    ??? Hx of CABG    ??? Kidney stone    ??? Kidney stones    ??? Neuropathy    ??? Pancreatic cancer ([place])    ??? Sepsis ([place])    ??? Tremor      Past Surgical History:   Procedure Laterality Date   ??? HX CERVICAL FUSION     ???  HX COLONOSCOPY     ??? HX CORONARY ARTERY BYPASS GRAFT  1995   ??? HX HEART CATHETERIZATION  2006   ??? HX ORTHOPAEDIC      back   ??? HX OTHER SURGICAL  2006    whipple procedure done for pancreatic cancer   ??? HX UROLOGICAL  2012    Percutaneous extraction of a kidney stone w/ fragmentation procedure      Social History     Social History     Socioeconomic History   ??? Marital status: MARRIED     Spouse name: Not on file   ??? Number of children: Not on file   ??? Years of education: Not on file   ??? Highest education level: Not on file   Occupational History   ??? Not on file   Tobacco Use   ??? Smoking status: Never Smoker   ??? Smokeless tobacco: Never Used   Substance and Sexual Activity   ??? Alcohol use: Not Currently   ??? Drug use: Not Currently   ??? Sexual activity: Not Currently   Other Topics Concern   ??? Not on file   Social History Narrative    ** Merged History  Encounter **          Social Determinants of Health     Financial Resource Strain:    ??? Difficulty of Paying Living Expenses:    Food Insecurity:    ??? Worried About Charity fundraiser in the Last Year:    ??? Arboriculturist in the Last Year:    Transportation Needs:    ??? Film/video editor (Medical):    ??? Lack of Transportation (Non-Medical):    Physical Activity:    ??? Days of Exercise per Week:    ??? Minutes of Exercise per Session:    Stress:    ??? Feeling of Stress :    Social Connections:    ??? Frequency of Communication with Friends and Family:    ??? Frequency of Social Gatherings with Friends and Family:    ??? Attends Religious Services:    ??? Marine scientist or Organizations:    ??? Attends Music therapist:    ??? Marital Status:    Intimate Production manager Violence:    ??? Fear of Current or Ex-Partner:    ??? Emotionally Abused:    ??? Physically Abused:    ??? Sexually Abused:      Family History     Family History   Problem Relation Age of Onset   ??? Alcohol abuse Father    ??? Heart Disease Brother    ??? Emphysema Brother      Current Medications     Prior to Admission Medications   Prescriptions Last Dose Informant Patient Reported? Taking?   allopurinol (ZYLOPRIM) 300 mg tablet   Yes No   Sig: Take  by mouth daily.   amLODIPine (NORVASC) 5 mg tablet   Yes No   Sig: Take 5 mg by mouth daily.   aspirin 81 mg chewable tablet   Yes No   Sig: Take 81 mg by mouth daily.   bimatoprost (Lumigan) 0.01 % ophthalmic drops   Yes No   Sig: Lumigan 0.01 % eye drops   gabapentin (NEURONTIN) 600 mg tablet   Yes No   Sig: Take 600 mg by mouth two (2) times a day.   lactobacillus rhamnosus gg 10 billion cell (Culturelle) 10 billion cell capsule  No No   Sig: Take 1 Cap by mouth Before breakfast and dinner.   lipase-protease-amylase (Creon) 24,000-76,000 -120,000 unit capsule   Yes No   Sig: Take  by mouth.   metoprolol succinate (TOPROL-XL) 25 mg XL tablet   Yes No   Sig: TAKE 1 TABLET BY MOUTH EVERY DAY   peg 400-propylene  glycol (Systane, propylene glycol,) 0.4-0.3 % drop   Yes No   Sig: Administer 2 Drops to both eyes daily. Patient places two drops in both eyes every AM.   Indications: dry eye   promethazine (PHENERGAN) 12.5 mg tablet   Yes No   Sig: promethazine 12.5 mg tablet   TAKE 1 TABLET BY MOUTH 2 TIMES PER DAY AS NEEDED   tamsulosin (Flomax) 0.4 mg capsule   No No   Sig: Take 2 Caps by mouth daily.   temazepam (RESTORIL) 7.5 mg capsule   Yes No   Sig: Take  by mouth nightly.   testosterone (ANDROGEL) 20.25 mg/1.25 gram (1.62 %) gel   No No   Sig: Apply 20 mg to affected area daily. Max Daily Amount: 20 mg.   testosterone cypionate (DEPOTESTOTERONE CYPIONATE) 200 mg/mL injection   Yes No   Sig: INJECT 0.5 MILLILITER INTO THE MUSCLE EVERY 2 WEEKS DISCARD VIAL AFTER USE      Facility-Administered Medications: None        Allergies     Allergies   Allergen Reactions   ??? Other Food Other (comments)     VISTAID  ANESTHESIA   ??? Codeine Itching and Swelling   ??? Oxycodone Anaphylaxis   ??? Hydroxyzine Pamoate Itching and Other (comments)     Other reaction(s): Unknown     ??? Meperidine Itching, Palpitations, Swelling and Other (comments)     Other reaction(s): Unknown     ??? Metoclopramide Itching, Other (comments) and Swelling     Other reaction(s): Other (See Comments)  Reaction unknown   Reaction unknown      ??? Morphine Other (comments) and Nausea and Vomiting   ??? Oxycodone-Acetaminophen Itching   ??? Prochlorperazine Itching, Other (comments) and Unknown (comments)     Other reaction(s): Unknown  Unsure of reaction     ??? Hydroxyzine Hcl Hives       Physical Exam     Patient Vitals for the past 24 hrs:   Temp Pulse Resp BP SpO2   07/21/19 0943 ??? 73 17 ??? 97 %   07/21/19 0852 97.7 ??F (36.5 ??C) 78 18 (!) 171/87 99 %     Physical Exam  Vitals and nursing note reviewed.   Constitutional:       Appearance: He is not ill-appearing, toxic-appearing or diaphoretic.   HENT:      Head: Normocephalic and atraumatic.      Right Ear: External ear  normal.      Left Ear: External ear normal.      Nose: Nose normal. No congestion.      Mouth/Throat:      Mouth: Mucous membranes are moist.      Pharynx: Oropharynx is clear. No oropharyngeal exudate.   Eyes:      General: No scleral icterus.     Conjunctiva/sclera: Conjunctivae normal.   Cardiovascular:      Rate and Rhythm: Normal rate and regular rhythm.      Pulses:           Radial pulses are 2+ on the right side and 2+ on the left side.  Heart sounds: Murmur heard.   Systolic murmur is present with a grade of 3/6.     Pulmonary:      Effort: No respiratory distress.      Breath sounds: Normal breath sounds. No wheezing or rales.   Abdominal:      General: Bowel sounds are normal. There is no distension.      Palpations: Abdomen is soft.      Tenderness: There is no abdominal tenderness. There is no guarding.   Musculoskeletal:         General: No swelling or tenderness.      Cervical back: Normal range of motion and neck supple. No tenderness.   Skin:     General: Skin is warm and dry.      Coloration: Skin is not jaundiced.      Findings: No erythema or rash.   Neurological:      Mental Status: He is alert and oriented to person, place, and time.      GCS: GCS eye subscore is 4. GCS verbal subscore is 5. GCS motor subscore is 6.      Cranial Nerves: No facial asymmetry.      Sensory: Sensation is intact.   Psychiatric:         Mood and Affect: Mood normal.         Speech: Speech normal.         Behavior: Behavior is cooperative.         Thought Content: Thought content normal.          Impression and Management Plan   Mr. Oralia Rud is a 74 year old male with known coronary artery disease and no ischemic work-up since heart catheterization in 2006.  Multivessel coronary disease with coronary bypass graft in 1995.  He is presenting with chest pain that is been episodic for "weeks to months" and has now been constant over the last couple of days.  He does not have a local cardiologist.  His last cardiology  evaluation was in 2019 and reviewed via care everywhere.  Chest Pain Impression: patient with chest pain. Acute ischemic coronary disease must be considered first, and the patient protected against consequences of same, while other etiologies (including infectious, pulmonary, GI and musculoskeletal)  are considered.  Standard chest pain triage labs initiated.  I have added a D-dimer because of the patient's history of cancer and a chest x-ray.    EKG: By my interpretation shows normal sinus rhythm with a rate of 77.  PR interval is 174 ms.  QRS narrow complex 82 ms.  QT interval corrected 423 ms.  Patient has inferior Q waves unchanged from prior EKG 22 June 2018.  No acute ST segment T wave changes concerning for ischemia.    Diagnostic Studies   Lab:   Recent Results (from the past 12 hour(s))   CBC WITH AUTOMATED DIFF    Collection Time: 07/21/19  8:42 AM   Result Value Ref Range    WBC 5.0 4.0 - 11.0 1000/mm3    RBC 4.65 3.80 - 5.70 M/uL    HGB 13.8 12.4 - 17.2 gm/dl    HCT 43.3 37.0 - 50.0 %    MCV 93.1 80.0 - 98.0 fL    MCH 29.7 23.0 - 34.6 pg    MCHC 31.9 30.0 - 36.0 gm/dl    PLATELET 160 140 - 450 1000/mm3    MPV 12.3 (H) 6.0 - 10.0 fL    RDW-SD 54.4 (H) 35.1 -  43.9      NRBC 0 0 - 0      IMMATURE GRANULOCYTES 0.2 0.0 - 3.0 %    NEUTROPHILS 61.5 34 - 64 %    LYMPHOCYTES 28.4 28 - 48 %    MONOCYTES 7.5 1 - 13 %    EOSINOPHILS 2.2 0 - 5 %    BASOPHILS 0.2 0 - 3 %   METABOLIC PANEL, BASIC    Collection Time: 07/21/19  8:42 AM   Result Value Ref Range    Sodium 148 (H) 136 - 145 mEq/L    Potassium 4.2 3.5 - 5.1 mEq/L    Chloride 117 (H) 98 - 107 mEq/L    CO2 24 21 - 32 mEq/L    Glucose 104 74 - 106 mg/dl    BUN 21 7 - 25 mg/dl    Creatinine 1.4 (H) 0.6 - 1.3 mg/dl    GFR est AA >60      GFR est non-AA 52      Calcium 9.1 8.5 - 10.1 mg/dl    Anion gap 8 5 - 15 mmol/L   TROPONIN I    Collection Time: 07/21/19  8:42 AM   Result Value Ref Range    Troponin-I <0.015 0.000 - 0.045 ng/ml       Imaging:    XR CHEST PA  LAT    Result Date: 07/21/2019  INDICATION: Chest pain. TECHNIQUE: PA/lateral Chest Radiograph. COMPARISON: 02/08/2019     IMPRESSION: Heart size upper normal. Patient status post sternotomy. Mild bibasilar atelectatic change. No acute infiltrate.       ED Course/Medical Decision Making   WBC 5.0.  Hemoglobin hematocrit and platelets normal.  Differential shows no left shift.  Sodium mildly elevated at 148.  Potassium normal at 4.2.  Electrolytes otherwise unremarkable.  BUN and creatinine are 21 and 1.4.  Previous creatinines have been 1.5-2.0.  Initial troponin nondetectable less than 0.015.  D-dimer Elevated at 0.78.  Age-adjusted this is close to normal.  CTA chest ordered.    Chest x-ray by my interpretation reveals shows no acute cardiopulmonary process.  I reviewed the radiology report as well.    Review of electronic medical records as follows:    Echocardiogram from 01/19/2018 as follows:  Interpretation Summary  A complete two-dimensional transthoracic echocardiogram was performed (2D, M-  mode, Doppler and color flow Doppler).  The study was technically adequate.  The left ventricle is normal in size, wall thickness, and systolic function.  Ejection fraction is [id number]  Diastolic function is Grade 1.  The right ventricle is grossly normal size, wall thickness and systolic  function.  Mild mitral and pulmonic regurgitation.  Mild aortic stenosis, Max = 88mmHg, Mean = 56mmHg, AVA = 1.6cm2  Right ventricular systolic pressure is 27 mmHg.  ??  No previous echocardiogram available for comparison    Cardiology note reviewed via care everywhere from office visit 06/17/2017.  Summary with plan as follows:    Assessment and Plan:    1. Multivessel CAD status post CABG in 1995. Cardiac catheterization in 2006 revealed patent bypass grafts. He has had no interval ischemic testing and remains comfortable with observation in the absence of progressive angina symptoms on medical therapy. I did review his follow-up ECG.    2.  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 Aortic murmur is most prominent. We will obtain a follow-up echocardiogram for surveillance.    3. Essential hypertension,  on Norvasc and Lopressor followed by Dr. Sherrie Sport.    4. Mixed hyperlipidemia, on Lipitor. Requesting most recent lab work from Dr. Sherrie Sport.    Current medicines were reviewed with the patient today.    I reviewed the patient's CTA images myself.  I do not see any evidence of pulmonary embolism.  There seem to be some patchy infiltrates in the posterior bases bilaterally.  Will defer to radiologist read.  Radiology felt there is no evidence of pulmonary embolism but the findings were consistent with mild congestive heart failure.  I have added a proBNP.    With the patient's known history of coronary artery disease and bypass surgery as well as no ischemic work-up since a catheterization in the mid 2000's and CT findings I feel the patient is more complicated than should be placed in our observation unit.  I have therefore, spoken to Dr. Dory Peru and requested observation status admission to telemetry on their service for further evaluation.  Procedures  Final Diagnosis       ICD-10-CM ICD-9-CM   1. Acute chest pain  R07.9 786.50   2. History of coronary artery disease  Z86.79 V12.59       Disposition   Admission observation status to [facility] hospitalist service as discussed with Dr. Dory Peru.      Serita Sheller, M.D.  Jul 21, 2019    My signature above authenticates this document and my orders, the final ??  diagnosis (es), discharge prescription (s), and instructions in the Epic ??  record.  If you have any questions please contact [phone].  ??  Nursing notes have been reviewed by the physician/ advanced practice ??  Clinician.  Dragon medical dictation software was used for portions of this report. Unintended voice recognition errors may occur.

## 2019-07-21 NOTE — H&P (Signed)
Admission History and Physical      Tommy SEKEL Sr.  03/20/1945  [id number]  PCP: Daralene Milch, MD    Chief Complaint   Patient presents with   ??? Chest Pain       HPI:      Tommy HAGA Sr. is a 74 y.o. male with a history of CAD status post CABG in 1995, hypertension, gout presented to hospital with complaint of chest pain.  Patient says off-and-on chest pain going on for few weeks.  Has worse episode yesterday.  Pain was in the left side.  8 out of 10.  With some radiation to left arm.  No particular aggravating factor.  Pain lasted over 2 hours.  He went to sleep with some improvement.  Woke up this morning and chest still not back to normal.  He went to see his doctor for testosterone injection and was referred to ER because of the chest pain.  Patient said he has not seen his cardiologist more than a year.  Denies any current shortness of breath.  Denies any fever.  No cough.       Past Medical History:   Diagnosis Date   ??? Arthritis    ??? Benign prostate hyperplasia    ??? Bladder infection    ??? Chronic kidney disease    ??? Coronary arteriosclerosis    ??? Coronary atherosclerosis of artery bypass graft    ??? Decreased testosterone level    ??? Diverticular disease    ??? Essential hypertension    ??? Gout    ??? History of acute renal failure    ??? History of anemia    ??? History of malignant neoplasm of pancreas    ??? Hx of CABG    ??? Kidney stone    ??? Kidney stones    ??? Neuropathy    ??? Pancreatic cancer ([place])    ??? Sepsis ([place])    ??? Tremor      Past Surgical History:   Procedure Laterality Date   ??? HX CERVICAL FUSION     ??? HX COLONOSCOPY     ??? HX CORONARY ARTERY BYPASS GRAFT  1995   ??? HX HEART CATHETERIZATION  2006   ??? HX ORTHOPAEDIC      back   ??? HX OTHER SURGICAL  2006    whipple procedure done for pancreatic cancer   ??? HX UROLOGICAL  2012    Percutaneous extraction of a kidney stone w/ fragmentation procedure      Social History     Socioeconomic History   ??? Marital status: MARRIED     Spouse name: Not on  file   ??? Number of children: Not on file   ??? Years of education: Not on file   ??? Highest education level: Not on file   Occupational History   ??? Not on file   Tobacco Use   ??? Smoking status: Never Smoker   ??? Smokeless tobacco: Never Used   Substance and Sexual Activity   ??? Alcohol use: Not Currently   ??? Drug use: Not Currently   ??? Sexual activity: Not Currently   Other Topics Concern   ??? Not on file   Social History Narrative    ** Merged History Encounter **          Social Determinants of Health     Financial Resource Strain:    ??? Difficulty of Paying Living Expenses:    Food Insecurity:    ???  Worried About Charity fundraiser in the Last Year:    ??? Arboriculturist in the Last Year:    Transportation Needs:    ??? Film/video editor (Medical):    ??? Lack of Transportation (Non-Medical):    Physical Activity:    ??? Days of Exercise per Week:    ??? Minutes of Exercise per Session:    Stress:    ??? Feeling of Stress :    Social Connections:    ??? Frequency of Communication with Friends and Family:    ??? Frequency of Social Gatherings with Friends and Family:    ??? Attends Religious Services:    ??? Marine scientist or Organizations:    ??? Attends Music therapist:    ??? Marital Status:    Intimate Production manager Violence:    ??? Fear of Current or Ex-Partner:    ??? Emotionally Abused:    ??? Physically Abused:    ??? Sexually Abused:      Family History   Problem Relation Age of Onset   ??? Alcohol abuse Father    ??? Heart Disease Brother    ??? Emphysema Brother      Allergies   Allergen Reactions   ??? Other Food Other (comments)     VISTAID  ANESTHESIA   ??? Codeine Itching and Swelling   ??? Oxycodone Anaphylaxis   ??? Hydroxyzine Pamoate Itching and Other (comments)     Other reaction(s): Unknown     ??? Meperidine Itching, Palpitations, Swelling and Other (comments)     Other reaction(s): Unknown     ??? Metoclopramide Itching, Other (comments) and Swelling     Other reaction(s): Other (See Comments)  Reaction unknown   Reaction  unknown      ??? Morphine Other (comments) and Nausea and Vomiting   ??? Oxycodone-Acetaminophen Itching   ??? Prochlorperazine Itching, Other (comments) and Unknown (comments)     Other reaction(s): Unknown  Unsure of reaction     ??? Hydroxyzine Hcl Hives       Home Medications:     Prior to Admission Medications   Prescriptions Last Dose Informant Patient Reported? Taking?   allopurinol (ZYLOPRIM) 300 mg tablet   Yes No   Sig: Take  by mouth daily.   amLODIPine (NORVASC) 5 mg tablet   Yes No   Sig: Take 5 mg by mouth daily.   aspirin 81 mg chewable tablet   Yes No   Sig: Take 81 mg by mouth daily.   bimatoprost (Lumigan) 0.01 % ophthalmic drops   Yes No   Sig: Lumigan 0.01 % eye drops   gabapentin (NEURONTIN) 600 mg tablet   Yes No   Sig: Take 600 mg by mouth two (2) times a day.   lactobacillus rhamnosus gg 10 billion cell (Culturelle) 10 billion cell capsule   No No   Sig: Take 1 Cap by mouth Before breakfast and dinner.   lipase-protease-amylase (Creon) 24,000-76,000 -120,000 unit capsule   Yes No   Sig: Take  by mouth.   metoprolol succinate (TOPROL-XL) 25 mg XL tablet   Yes No   Sig: TAKE 1 TABLET BY MOUTH EVERY DAY   peg 400-propylene glycol (Systane, propylene glycol,) 0.4-0.3 % drop   Yes No   Sig: Administer 2 Drops to both eyes daily. Patient places two drops in both eyes every AM.   Indications: dry eye   promethazine (PHENERGAN) 12.5 mg tablet   Yes No  Sig: promethazine 12.5 mg tablet   TAKE 1 TABLET BY MOUTH 2 TIMES PER DAY AS NEEDED   tamsulosin (Flomax) 0.4 mg capsule   No No   Sig: Take 2 Caps by mouth daily.   temazepam (RESTORIL) 7.5 mg capsule   Yes No   Sig: Take  by mouth nightly.   testosterone (ANDROGEL) 20.25 mg/1.25 gram (1.62 %) gel   No No   Sig: Apply 20 mg to affected area daily. Max Daily Amount: 20 mg.   testosterone cypionate (DEPOTESTOTERONE CYPIONATE) 200 mg/mL injection   Yes No   Sig: INJECT 0.5 MILLILITER INTO THE MUSCLE EVERY 2 WEEKS DISCARD VIAL AFTER USE       Facility-Administered Medications: None       Review of Systems   Constitutional: Negative for fever, chills, weight loss, malaise/fatigue and diaphoresis.   HENT: Negative for ear pain, congestion and sore throat.   Eyes: Negative for blurred vision, double vision and redness.   Respiratory: Negative for shortness of breath. Negative for cough, hemoptysis, sputum production and wheezing.   Cardiovascular: Chest pain  Gastrointestinal: Negative for heartburn, nausea, abdominal pain, diarrhea, blood in stool and melena.   Genitourinary: Negative for dysuria, urgency, frequency and hematuria.   Musculoskeletal: Negative for myalgias, back pain, joint pain and falls.   Skin: Negative for rash.   Neurological: Negative for dizziness, tingling, tremors, speech change, seizures, loss of consciousness, weakness and headaches.   Endo/Heme/Allergies: Negative for polydipsia. Does not bruise/bleed easily.   Psychiatric/Behavioral: Negative for hallucinations and memory loss. The patient does not have insomnia    Physical Assessment:     Patient Vitals for the past 12 hrs:   Temp Pulse Resp BP SpO2   07/21/19 1102 ??? 69 18 136/75 94 %   07/21/19 1012 ??? 69 12 139/70 95 %   07/21/19 0943 ??? 73 17 ??? 97 %   07/21/19 0852 97.7 ??F (36.5 ??C) 78 18 (!) 171/87 99 %       Constitutional:  oriented to person, place, and time and well-developed, well-nourished, and in no distress.   HENT:   Head: Normocephalic and atraumatic.   Nose: Nose normal.   Mouth/Throat: Oropharynx is clear and moist.   Eyes: Conjunctivae and EOM are normal. Pupils are equal, round, and reactive to light.   Neck: Neck supple.   Cardiovascular: Normal rate, regular rhythm, normal heart sounds and intact distal pulses.   Pulmonary/Chest: Effort normal and breath sounds normal.   Abdominal: Soft. Bowel sounds are normal.   Musculoskeletal: Normal range of motion. No clubbing or cyanosis. Pedal pulses 2+ b/l. ??Capillary refill normal.  Neurological: Alert and oriented  to person, place, and time. Gait normal.   Skin: Skin is warm and dry.   Psychiatric: Memory, affect and judgment normal    Recent Results (from the past 24 hour(s))   CBC WITH AUTOMATED DIFF    Collection Time: 07/21/19  8:42 AM   Result Value Ref Range    WBC 5.0 4.0 - 11.0 1000/mm3    RBC 4.65 3.80 - 5.70 M/uL    HGB 13.8 12.4 - 17.2 gm/dl    HCT 43.3 37.0 - 50.0 %    MCV 93.1 80.0 - 98.0 fL    MCH 29.7 23.0 - 34.6 pg    MCHC 31.9 30.0 - 36.0 gm/dl    PLATELET 160 140 - 450 1000/mm3    MPV 12.3 (H) 6.0 - 10.0 fL    RDW-SD 54.4 (H)  35.1 - 43.9      NRBC 0 0 - 0      IMMATURE GRANULOCYTES 0.2 0.0 - 3.0 %    NEUTROPHILS 61.5 34 - 64 %    LYMPHOCYTES 28.4 28 - 48 %    MONOCYTES 7.5 1 - 13 %    EOSINOPHILS 2.2 0 - 5 %    BASOPHILS 0.2 0 - 3 %   METABOLIC PANEL, BASIC    Collection Time: 07/21/19  8:42 AM   Result Value Ref Range    Sodium 148 (H) 136 - 145 mEq/L    Potassium 4.2 3.5 - 5.1 mEq/L    Chloride 117 (H) 98 - 107 mEq/L    CO2 24 21 - 32 mEq/L    Glucose 104 74 - 106 mg/dl    BUN 21 7 - 25 mg/dl    Creatinine 1.4 (H) 0.6 - 1.3 mg/dl    GFR est AA >60      GFR est non-AA 52      Calcium 9.1 8.5 - 10.1 mg/dl    Anion gap 8 5 - 15 mmol/L   TROPONIN I    Collection Time: 07/21/19  8:42 AM   Result Value Ref Range    Troponin-I <0.015 0.000 - 0.045 ng/ml   NT-PRO BNP    Collection Time: 07/21/19  8:42 AM   Result Value Ref Range    NT pro-BNP 114.0 0.0 - 450.0 pg/ml   D DIMER    Collection Time: 07/21/19  8:43 AM   Result Value Ref Range    D DIMER 0.78 (H) 0.01 - 0.50 ug/mL (FEU)       All Micro Results     None          Imaging:    XR CHEST PA LAT    Result Date: 07/21/2019  INDICATION: Chest pain. TECHNIQUE: PA/lateral Chest Radiograph. COMPARISON: 02/08/2019     IMPRESSION: Heart size upper normal. Patient status post sternotomy. Mild bibasilar atelectatic change. No acute infiltrate.     CT CHEST W CON PE PROTOCOL    Result Date: 07/21/2019  EXAM:  CT CHEST W CON PE PROTOCOL INDICATION: Chest pain, elevated  D-dimer. COMPARISON: 07/21/2019 TECHNIQUE: Multiplanar CT angiography of the chest was performed after administering nonionic intravenous contrast. 3D MIP reformats were performed. All CT exams at this facility use one or more dose reduction techniques including automatic exposure control, mA/kV adjustment per patient's size, or iterative reconstruction technique. FINDINGS: CONTRAST BOLUS: Satisfactory. PULMONARY ARTERIES: No  evidence of pulmonary embolism. LUNGS: Patchy perihilar groundglass and interlobular and intralobular septal thickening compatible with congestive failure. Scattered bilateral micronodules, largest measures approximately 3 mm in the lingula (46 series 4). Bibasilar atelectasis. CENTRAL AIRWAYS: Patent. PLEURA: No pleural effusions. MEDIASTINUM AND HILA: No lymphadenopathy. HEART: Cardiomegaly. No pericardial effusion. Stable postoperative changes of prior CABG. VESSELS: Atherosclerosis. CHEST WALL: Median sternotomy. BONES: No acute fracture or aggressive osseous lesion. UPPER ABDOMEN: Trace pneumobilia, stable in appearance.     IMPRESSION: 1. No pulmonary embolism. 2. Mild congestive failure. WORKSTATION ID: [id number]       Echo Results  (Last 48 hours)    None                 Impression:   Chest pain  History of CAD s/p CABG  Hypertension  Elevated D-dimer PE ruled out  Gout        Plan:   With history of CAD status post CABG presenting with  chest pain.  EKG reviewed no acute ischemia.  CT of the chest negative for PE.  Initial troponin is negative.  We will keep patient on telemetry.  Get serial troponins.  Continue aspirin.  Will order echocardiogram.  I will also order stress test.  Patient elevated dimer was CT negative for PE.  Will monitor blood pressure closely.  Continue Toprol-XL and Norvasc.Ophelia Shoulder medicine as needed.  I will check fasting lipid panel.  Continue Flomax.  Continue allopurinol.  Subcu Lovenox for DVT prophylaxis.  Treatment plan discussed with family at  bedside.    Advanced care planning  Had extensive discussion with patient and wife about advanced care planning.  All different scenarios were discussed.  After detailed discussion patient okay to have medication and CPR done in case of cardiopulmonary arrest however he does not want intubation.  Patient wife is at bedside and agrees with patient decision.  Patient also does not want any permanent feeding tube.  Partial CODE STATUS was placed in system.  Time spent in advanced care planning 16-minute.    Dragon medical dictation software was used for portions of this report. Unintended errors may occur.   Clearnce Sorrel, MD  07/21/2019 2:07 PM

## 2019-07-21 NOTE — ED Notes (Signed)
TRANSFER - OUT REPORT:    Verbal report given to 2217 RN(name) on Tommy F Gammon Sr.  being transferred to 2217(unit) for routine progression of care       Report consisted of patient's Situation, Background, Assessment and   Recommendations(SBAR).     Information from the following report(s) SBAR, ED Summary, [facility] and Recent Results was reviewed with the receiving nurse.    Lines:   Peripheral IV 07/21/19 Right Antecubital (Active)   Site Assessment Clean, dry, & intact 07/21/19 0843   Phlebitis Assessment 0 07/21/19 0843   Infiltration Assessment 0 07/21/19 0843   Dressing Status Clean, dry, & intact 07/21/19 0843   Dressing Type Transparent 07/21/19 0843   Hub Color/Line Status Flushed;Patent 07/21/19 8315        Opportunity for questions and clarification was provided.      Patient transported with:   [employer]

## 2019-07-21 NOTE — ED Notes (Signed)
Patient given a dinner tray.

## 2019-07-22 ENCOUNTER — Observation Stay: Admit: 2019-07-22 | Payer: MEDICARE | Primary: Family Medicine

## 2019-07-22 LAB — TROPONIN I
Troponin-I: <0.015 ng/ml (ref 0.000–0.045)
Troponin-I: <0.015 ng/ml (ref 0.000–0.045)

## 2019-07-22 LAB — CBC WITH AUTOMATED DIFF
BASOPHILS: 0.2 % (ref 0–3)
EOSINOPHILS: 1.7 % (ref 0–5)
HCT: 41.1 % (ref 37.0–50.0)
HGB: 13.3 gm/dl (ref 12.4–17.2)
IMMATURE GRANULOCYTES: 0.3 % (ref 0.0–3.0)
LYMPHOCYTES: 29.1 % (ref 28–48)
MCH: 30.1 pg (ref 23.0–34.6)
MCHC: 32.4 gm/dl (ref 30.0–36.0)
MCV: 93 fL (ref 80.0–98.0)
MONOCYTES: 9.1 % (ref 1–13)
MPV: 11.6 fL — ABNORMAL HIGH (ref 6.0–10.0)
NEUTROPHILS: 59.6 % (ref 34–64)
NRBC: 0 (ref 0–0)
PLATELET: 146 10*3/uL (ref 140–450)
RBC: 4.42 M/uL (ref 3.80–5.70)
RDW-SD: 54.8 — ABNORMAL HIGH (ref 35.1–43.9)
WBC: 5.9 10*3/uL (ref 4.0–11.0)

## 2019-07-22 LAB — METABOLIC PANEL, BASIC
Anion gap: 6 mmol/L (ref 5–15)
BUN: 18 mg/dl (ref 7–25)
CO2: 24 mEq/L (ref 21–32)
Calcium: 8.7 mg/dl (ref 8.5–10.1)
Chloride: 112 mEq/L — ABNORMAL HIGH (ref 98–107)
Creatinine: 1.2 mg/dl (ref 0.6–1.3)
GFR est AA: >60
GFR est non-AA: >60
Glucose: 89 mg/dl (ref 74–106)
Potassium: 4.2 mEq/L (ref 3.5–5.1)
Sodium: 143 mEq/L (ref 136–145)

## 2019-07-22 LAB — LIPID PANEL
CHOL/HDL Ratio: 3.6 Ratio (ref 0.0–5.0)
Chol/HDL Ratio: 3.6 Ratio (ref 0.0–5.0)
Cholesterol, Total: 119 mg/dl — ABNORMAL LOW (ref 140–199)
Cholesterol, total: 119 mg/dl — ABNORMAL LOW (ref 140–199)
HDL Cholesterol: 33 mg/dl — ABNORMAL LOW (ref 40–96)
HDL: 33 mg/dl — ABNORMAL LOW (ref 40–96)
LDL Calculated: 56 mg/dl (ref 0–130)
LDL, calculated: 56 mg/dl (ref 0–130)
Triglyceride: 152 mg/dl — ABNORMAL HIGH (ref 29–150)
Triglycerides: 152 mg/dl — ABNORMAL HIGH (ref 29–150)

## 2019-07-22 LAB — HEMOGLOBIN A1C W/O EAG
Hemoglobin A1C: 5 % (ref 4.2–5.6)
Hemoglobin A1c: 5 % (ref 4.2–5.6)

## 2019-07-22 LAB — NM MYOCARDIAL SPECT REST EXERCISE OR RX: Left Ventricular Ejection Fraction: 57

## 2019-07-22 LAB — BASIC METABOLIC PANEL
Anion Gap: 6 mmol/L (ref 5–15)
BUN: 18 mg/dl (ref 7–25)
CO2: 24 mEq/L (ref 21–32)
Calcium: 8.7 mg/dl (ref 8.5–10.1)
Chloride: 112 mEq/L — ABNORMAL HIGH (ref 98–107)
Creatinine: 1.2 mg/dl (ref 0.6–1.3)
EGFR IF NonAfrican American: >60
GFR African American: >60
Glucose: 89 mg/dl (ref 74–106)
Potassium: 4.2 mEq/L (ref 3.5–5.1)
Sodium: 143 mEq/L (ref 136–145)

## 2019-07-22 LAB — CBC WITH AUTO DIFFERENTIAL
Basophils %: 0.2 % (ref 0–3)
Eosinophils %: 1.7 % (ref 0–5)
Hematocrit: 41.1 % (ref 37.0–50.0)
Hemoglobin: 13.3 gm/dl (ref 12.4–17.2)
Immature Granulocytes: 0.3 % (ref 0.0–3.0)
Lymphocytes %: 29.1 % (ref 28–48)
MCH: 30.1 pg (ref 23.0–34.6)
MCHC: 32.4 gm/dl (ref 30.0–36.0)
MCV: 93 fL (ref 80.0–98.0)
MPV: 11.6 fL — ABNORMAL HIGH (ref 6.0–10.0)
Monocytes %: 9.1 % (ref 1–13)
Neutrophils %: 59.6 % (ref 34–64)
Nucleated RBCs: 0 (ref 0–0)
Platelets: 146 10*3/uL (ref 140–450)
RBC: 4.42 M/uL (ref 3.80–5.70)
RDW-SD: 54.8 — ABNORMAL HIGH (ref 35.1–43.9)
WBC: 5.9 10*3/uL (ref 4.0–11.0)

## 2019-07-22 LAB — TROPONIN
Troponin I: <0.015 ng/ml (ref 0.000–0.045)
Troponin I: <0.015 ng/ml (ref 0.000–0.045)

## 2019-07-22 MED ORDER — SODIUM CHLORIDE 0.9 % IJ SYRG
Freq: Once | INTRAMUSCULAR | Status: AC
Start: 2019-07-22 — End: 2019-07-22
  Administered 2019-07-22: 14:00:00 via INTRAVENOUS

## 2019-07-22 MED ORDER — TECHNETIUM TC-99M SESTAMIBI (CARDIOLITE) INJECTION WITH DILUTION KIT
Freq: Once | INTRAVENOUS | Status: AC
Start: 2019-07-22 — End: 2019-07-22
  Administered 2019-07-22: 13:00:00 via INTRAVENOUS

## 2019-07-22 MED ORDER — PROMETHAZINE IN NS 12.5 MG/50 ML IV PIGGY BAG
12.5 mg/50 ml | Freq: Four times a day (QID) | INTRAVENOUS | Status: DC | PRN
Start: 2019-07-22 — End: 2019-07-23
  Administered 2019-07-23: via INTRAVENOUS

## 2019-07-22 MED ORDER — REGADENOSON 0.4 MG/5 ML IV SYRINGE
0.4 mg/5 mL | Freq: Once | INTRAVENOUS | Status: AC
Start: 2019-07-22 — End: 2019-07-22
  Administered 2019-07-22: 14:00:00 via INTRAVENOUS

## 2019-07-22 MED ORDER — TEMAZEPAM 7.5 MG CAP
7.5 mg | Freq: Once | ORAL | Status: AC
Start: 2019-07-22 — End: 2019-07-22
  Administered 2019-07-22: 04:00:00 via ORAL

## 2019-07-22 MED ORDER — LIPASE-PROTEASE-AMYLASE 24,000-76,000-120,000 UNIT CAP, DELAY REL
24000-76000 -120,000 unit | Freq: Four times a day (QID) | ORAL | Status: DC
Start: 2019-07-22 — End: 2019-07-23
  Administered 2019-07-22 – 2019-07-23 (×5): via ORAL

## 2019-07-22 MED ORDER — AMINOPHYLLINE 250 MG/10 ML IV
250 mg/10 mL | Freq: Once | INTRAVENOUS | Status: AC
Start: 2019-07-22 — End: 2019-07-22
  Administered 2019-07-22: 14:00:00 via INTRAVENOUS

## 2019-07-22 MED FILL — TECHNETIUM TC-99M SESTAMIBI (CARDIOLITE) INJECTION WITH DILUTION KIT: INTRAVENOUS | Qty: 1

## 2019-07-22 MED FILL — FAMOTIDINE 20 MG TAB: 20 mg | ORAL | Qty: 1

## 2019-07-22 MED FILL — ENOXAPARIN 40 MG/0.4 ML SUB-Q SYRINGE: 40 mg/0.4 mL | SUBCUTANEOUS | Qty: 0.4

## 2019-07-22 MED FILL — CREON 24,000-76,000-120,000 UNIT CAPSULE,DELAYED RELEASE: 24000-76000 -120,000 unit | ORAL | Qty: 2

## 2019-07-22 MED FILL — PROMETHAZINE IN NS 12.5 MG/50 ML IV PIGGY BAG: 12.5 mg/50 ml | INTRAVENOUS | Qty: 50

## 2019-07-22 MED FILL — GABAPENTIN 300 MG CAP: 300 mg | ORAL | Qty: 2

## 2019-07-22 MED FILL — TEMAZEPAM 7.5 MG CAP: 7.5 mg | ORAL | Qty: 1

## 2019-07-22 MED FILL — TECHNETIUM TC-99M SESTAMIBI (CARDIOLITE) INJECTION WITH DILUTION KIT: INTRAVENOUS | Qty: 0.4

## 2019-07-22 NOTE — Progress Notes (Signed)
Medicine Progress Note    Date of Admission: 07/21/2019  Length of Stay: 0    Assessment And Plan     Chest pain  History of CAD s/p CABG  Hypertension  Elevated D-dimer PE ruled out  Gout  ??  ??  ??  Plan:   With history of CAD status post CABG presenting with chest pain.  EKG reviewed no acute ischemia.  CT of the chest negative for PE.  Patient is feeling some better today.  Troponins are negative.  Scheduled for stress test and echocardiogram today.  Continue aspirin and Toprol-XL.  Patient elevated dimer was CT negative for PE.  Blood pressure stable with Toprol-XL and Norvasc.  Lipid panel reviewed.  LDL 56.  Continue Flomax.  Continue allopurinol.  Subcu Lovenox for DVT prophylaxis.  Treatment plan discussed with family at bedside.        Discussed with patient. Updates regarding diagnose, tests results, prognosis, treatment  of the patient's medical conditions discussed in detail     Subjective:     Chest pain better.  No shortness of breath.  Complaining some nausea.     Objective:     Visit Vitals  BP (!) 157/81 (BP 1 Location: Left arm, BP Patient Position: Supine)   Pulse 72   Temp 97.9 ??F (36.6 ??C)   Resp 15   SpO2 97%       No intake or output data in the 24 hours ending 07/22/19 1327  Constitutional:??Awake and alert, NAD  HENT:??atraumatic, normocephalic, oropharynx clear ??  Eyes:??????conjunctiva normal  Neck:??????supple and trachea normal  Cardiovascular:??S1, S2 no murmur.  Pulmonary/Chest Wall: Lungs clear.  Abdominal:????????????appearance normal, soft, non-tender upon palpation,bowel sounds normal.  Neurological:??????awake, alert and oriented, CN intact, no focal neuro deficits, moves all extremities equally .  Extremities:???? No clubbing or cyanosis. Pedal pulses 2+ b/l.  Capillary refill normal.Skin intact      Recent Results (from the past 24 hour(s))   TROPONIN I    Collection Time: 07/21/19  7:17 PM   Result Value Ref Range    Troponin-I <0.015 0.000 - 0.045 ng/ml   TROPONIN I    Collection Time: 07/21/19  11:31 PM   Result Value Ref Range    Troponin-I <0.015 0.000 - 0.045 ng/ml   CBC WITH AUTOMATED DIFF    Collection Time: 07/22/19  7:23 AM   Result Value Ref Range    WBC 5.9 4.0 - 11.0 1000/mm3    RBC 4.42 3.80 - 5.70 M/uL    HGB 13.3 12.4 - 17.2 gm/dl    HCT 41.1 37.0 - 50.0 %    MCV 93.0 80.0 - 98.0 fL    MCH 30.1 23.0 - 34.6 pg    MCHC 32.4 30.0 - 36.0 gm/dl    PLATELET 146 140 - 450 1000/mm3    MPV 11.6 (H) 6.0 - 10.0 fL    RDW-SD 54.8 (H) 35.1 - 43.9      NRBC 0 0 - 0      IMMATURE GRANULOCYTES 0.3 0.0 - 3.0 %    NEUTROPHILS 59.6 34 - 64 %    LYMPHOCYTES 29.1 28 - 48 %    MONOCYTES 9.1 1 - 13 %    EOSINOPHILS 1.7 0 - 5 %    BASOPHILS 0.2 0 - 3 %   METABOLIC PANEL, BASIC    Collection Time: 07/22/19  7:23 AM   Result Value Ref Range    Sodium 143 136 - 145 mEq/L  Potassium 4.2 3.5 - 5.1 mEq/L    Chloride 112 (H) 98 - 107 mEq/L    CO2 24 21 - 32 mEq/L    Glucose 89 74 - 106 mg/dl    BUN 18 7 - 25 mg/dl    Creatinine 1.2 0.6 - 1.3 mg/dl    GFR est AA >60      GFR est non-AA >60      Calcium 8.7 8.5 - 10.1 mg/dl    Anion gap 6 5 - 15 mmol/L   HEMOGLOBIN A1C W/O EAG    Collection Time: 07/22/19  7:23 AM   Result Value Ref Range    Hemoglobin A1c 5.0 4.2 - 5.6 %   LIPID PANEL    Collection Time: 07/22/19  7:23 AM   Result Value Ref Range    Cholesterol, total 119 (L) 140 - 199 mg/dl    HDL Cholesterol 33 (L) 40 - 96 mg/dl    Triglyceride 152 (H) 29 - 150 mg/dl    LDL, calculated 56 0 - 130 mg/dl    CHOL/HDL Ratio 3.6 0.0 - 5.0 Ratio       All Micro Results     None          Imaging:    XR CHEST PA LAT    Result Date: 07/21/2019  INDICATION: Chest pain. TECHNIQUE: PA/lateral Chest Radiograph. COMPARISON: 02/08/2019     IMPRESSION: Heart size upper normal. Patient status post sternotomy. Mild bibasilar atelectatic change. No acute infiltrate.     CT CHEST W CON PE PROTOCOL    Result Date: 07/21/2019  EXAM:  CT CHEST W CON PE PROTOCOL INDICATION: Chest pain, elevated D-dimer. COMPARISON: 07/21/2019 TECHNIQUE:  Multiplanar CT angiography of the chest was performed after administering nonionic intravenous contrast. 3D MIP reformats were performed. All CT exams at this facility use one or more dose reduction techniques including automatic exposure control, mA/kV adjustment per patient's size, or iterative reconstruction technique. FINDINGS: CONTRAST BOLUS: Satisfactory. PULMONARY ARTERIES: No  evidence of pulmonary embolism. LUNGS: Patchy perihilar groundglass and interlobular and intralobular septal thickening compatible with congestive failure. Scattered bilateral micronodules, largest measures approximately 3 mm in the lingula (46 series 4). Bibasilar atelectasis. CENTRAL AIRWAYS: Patent. PLEURA: No pleural effusions. MEDIASTINUM AND HILA: No lymphadenopathy. HEART: Cardiomegaly. No pericardial effusion. Stable postoperative changes of prior CABG. VESSELS: Atherosclerosis. CHEST WALL: Median sternotomy. BONES: No acute fracture or aggressive osseous lesion. UPPER ABDOMEN: Trace pneumobilia, stable in appearance.     IMPRESSION: 1. No pulmonary embolism. 2. Mild congestive failure. WORKSTATION ID: [id number]           Echo Results  (Last 48 hours)    None          Current Facility-Administered Medications   Medication Dose Route Frequency   ??? allopurinoL (ZYLOPRIM) tablet 300 mg  300 mg Oral DAILY   ??? amLODIPine (NORVASC) tablet 5 mg  5 mg Oral DAILY   ??? aspirin chewable tablet 81 mg  81 mg Oral DAILY   ??? gabapentin (NEURONTIN) capsule 600 mg  600 mg Oral BID   ??? metoprolol succinate (TOPROL-XL) XL tablet 25 mg  25 mg Oral DAILY   ??? tamsulosin (FLOMAX) capsule 0.8 mg  0.8 mg Oral DAILY   ??? naloxone (NARCAN) injection 0.1 mg  0.1 mg IntraVENous PRN   ??? acetaminophen (TYLENOL) tablet 650 mg  650 mg Oral Q4H PRN    Or   ??? acetaminophen (TYLENOL) solution 650 mg  650 mg Oral Q4H  PRN    Or   ??? acetaminophen (TYLENOL) suppository 650 mg  650 mg Rectal Q4H PRN   ??? ondansetron (ZOFRAN) injection 4 mg  4 mg IntraVENous Q4H PRN    ??? enoxaparin (LOVENOX) injection 40 mg  40 mg SubCUTAneous Q24H   ??? famotidine (PEPCID) tablet 20 mg  20 mg Oral BID            Dragon medical dictation software was used for portions of this report. Unintended errors may occur.   Clearnce Sorrel, MD  07/22/2019

## 2019-07-22 NOTE — Progress Notes (Signed)
Problem: Falls - Risk of  Goal: *Absence of Falls  Description: Document Schmid Fall Risk and appropriate interventions in the flowsheet.  Outcome: Progressing Towards Goal  Note: Fall Risk Interventions:            Medication Interventions: Patient to call before getting OOB, Teach patient to arise slowly

## 2019-07-22 NOTE — Progress Notes (Signed)
Pt requesting for home med temazepam 7.5mg  to take tonight, received last night but was only one time. Spoke with Dr. Dawson Bills if we can reorder, MD OK to reorder x1 tonight and stated pt to talk with attending in AM if pt should continue med nightly.

## 2019-07-23 MED ORDER — OMEPRAZOLE 40 MG CAP, DELAYED RELEASE
40 mg | ORAL_CAPSULE | Freq: Every day | ORAL | 1 refills | Status: DC
Start: 2019-07-23 — End: 2020-04-03

## 2019-07-23 MED ORDER — TEMAZEPAM 7.5 MG CAP
7.5 mg | Freq: Once | ORAL | Status: AC
Start: 2019-07-23 — End: 2019-07-22
  Administered 2019-07-23: 03:00:00 via ORAL

## 2019-07-23 MED FILL — GABAPENTIN 300 MG CAP: 300 mg | ORAL | Qty: 2

## 2019-07-23 MED FILL — TAMSULOSIN SR 0.4 MG 24 HR CAP: 0.4 mg | ORAL | Qty: 2

## 2019-07-23 MED FILL — PROMETHAZINE IN NS 12.5 MG/50 ML IV PIGGY BAG: 12.5 mg/50 ml | INTRAVENOUS | Qty: 50

## 2019-07-23 MED FILL — ALLOPURINOL 300 MG TAB: 300 mg | ORAL | Qty: 1

## 2019-07-23 MED FILL — CREON 24,000-76,000-120,000 UNIT CAPSULE,DELAYED RELEASE: 24000-76000 -120,000 unit | ORAL | Qty: 2

## 2019-07-23 MED FILL — FAMOTIDINE 20 MG TAB: 20 mg | ORAL | Qty: 1

## 2019-07-23 MED FILL — METOPROLOL SUCCINATE SR 25 MG 24 HR TAB: 25 mg | ORAL | Qty: 1

## 2019-07-23 MED FILL — TEMAZEPAM 7.5 MG CAP: 7.5 mg | ORAL | Qty: 1

## 2019-07-23 MED FILL — ASPIRIN 81 MG CHEWABLE TAB: 81 mg | ORAL | Qty: 1

## 2019-07-23 MED FILL — AMLODIPINE 5 MG TAB: 5 mg | ORAL | Qty: 1

## 2019-07-23 NOTE — Discharge Summary (Signed)
DISCHARGE SUMMARY     Patient Name: Tommy Brown  Medical Record Number: [id number]  Date of Birth: 07-19-1945  Discharge Provider: Clearnce Sorrel, MD  Primary Care Provider: Daralene Milch, MD    Admit date: 07/21/2019  Discharge Date:  Aug 11, 2019 Time: 11:12 AM  Discharge Disposition: Home  Code Status: Partial Code    Follow-up appointments:     Follow-up Information     Follow up With Specialties Details Why Contact Info    Deniece Ree, MD Internal Medicine Schedule an appointment as soon as possible for a visit in 4 weeks Patient to make appointment. [address]  [address]  [place] [address]  [phone]      [place], [place]. Shahid, MD Cardiology Schedule an appointment as soon as possible for a visit in 3 weeks Patient to make appointment. [address]  [address]  [place] [address]  [phone]      Daralene Milch, MD Family Medicine   [address] [place]  [address]  [place] [address]  [phone]             Follow-up recommendations:   Follow-up with primary care physician, cardiology and oncology.  Discharge diagnosis:  1. Primary diagnosis           Acute chest pain                    Hospital Problems as of August 11, 2019 Date Reviewed: 2019-08-11        Codes Class Noted - Resolved POA    * (Principal) Acute chest pain ICD-10-CM: R07.9  ICD-9-CM: 786.50  07/21/2019 - Present Unknown        History of coronary artery disease ICD-10-CM: Z86.79  ICD-9-CM: V12.59  07/21/2019 - Present Unknown                                            Past Medical History:   Diagnosis Date   ??? Arthritis    ??? Benign prostate hyperplasia    ??? Bladder infection    ??? Chronic kidney disease    ??? Coronary arteriosclerosis    ??? Coronary atherosclerosis of artery bypass graft    ??? Decreased testosterone level    ??? Diverticular disease    ??? Essential hypertension    ??? Gout    ??? History of acute renal failure    ??? History of anemia    ??? History of malignant neoplasm of pancreas    ??? Hx  of CABG    ??? Kidney stone    ??? Kidney stones    ??? Neuropathy    ??? Pancreatic cancer ([place])    ??? Sepsis ([place])    ??? Tremor                                                         Discharge Medications:                 Current Discharge Medication List      START taking these medications    Details   omeprazole (PRILOSEC) 40 mg capsule Take 1 Capsule by mouth daily.  Qty: 30  Capsule, Refills: 1  Start date: 07/23/2019         CONTINUE these medications which have NOT CHANGED    Details   bimatoprost (Lumigan) 0.01 % ophthalmic drops Lumigan 0.01 % eye drops      promethazine (PHENERGAN) 12.5 mg tablet promethazine 12.5 mg tablet   TAKE 1 TABLET BY MOUTH 2 TIMES PER DAY AS NEEDED      testosterone cypionate (DEPOTESTOTERONE CYPIONATE) 200 mg/mL injection INJECT 0.5 MILLILITER INTO THE MUSCLE EVERY 2 WEEKS DISCARD VIAL AFTER USE      metoprolol succinate (TOPROL-XL) 25 mg XL tablet TAKE 1 TABLET BY MOUTH EVERY DAY      lipase-protease-amylase (Creon) 24,000-76,000 -120,000 unit capsule Take 2 Capsules by mouth four (4) times daily (with meals).      temazepam (RESTORIL) 7.5 mg capsule Take  by mouth nightly.      tamsulosin (Flomax) 0.4 mg capsule Take 2 Caps by mouth daily.  Qty: 60 Cap, Refills: 1      peg 400-propylene glycol (Systane, propylene glycol,) 0.4-0.3 % drop Administer 2 Drops to both eyes daily. Patient places two drops in both eyes every AM.   Indications: dry eye      gabapentin (NEURONTIN) 600 mg tablet Take 600 mg by mouth two (2) times a day.      amLODIPine (NORVASC) 5 mg tablet Take 5 mg by mouth daily.      allopurinol (ZYLOPRIM) 300 mg tablet Take  by mouth daily.      aspirin 81 mg chewable tablet Take 81 mg by mouth daily.      testosterone (ANDROGEL) 20.25 mg/1.25 gram (1.62 %) gel Apply 20 mg to affected area daily. Max Daily Amount: 20 mg.  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  Qty: 40 Cap, Refills: 0             Discharge Diet:            Diet: Cardiac Diet    Consultants: None    Procedures:   * No surgery found *        Most Recent BMP and CBC:    Recent Labs     07/22/19  0723   BUN 18   NA 143   CO2 24     Recent Labs     07/22/19  0723   WBC 5.9   RBC 4.42   HCT 41.1   MCV 93.0   MCH 30.1   MCHC 32.4       Imaging:    XR CHEST PA LAT    Result Date: 07/21/2019  INDICATION: Chest pain. TECHNIQUE: PA/lateral Chest Radiograph. COMPARISON: 02/08/2019     IMPRESSION: Heart size upper normal. Patient status post sternotomy. Mild bibasilar atelectatic change. No acute infiltrate.     NM CARDIAC SPECT W STRS/REST MULT    Result Date: 07/22/2019  EXAMINATION: NM CARDIAC SPECT W STRS/REST MULT CLINICAL INDICATION: Chest pain, history of CABG. COMPARISON: None TECHNIQUE: LEXISCAN (0.4mg Lexiscan given IV per protocol.) rest -stress protocol. After injection of 11 mCi TC 51m labeled Sestamibi IV, resting CT attenuation corrected SPECT images of the heart were acquired.  During peak stress,  33 mCi of TC 55m labeled Sestamibi IV was injected and gated SPECT images of the heart were obtained and viewed in  short, vertical and horizontal axis. DEMOGRAPHICS: Height 5' 6 , Weight 140 lbs. MYOCARDIAL PERFUSION FINDINGS: Wall Motion: Normal. Ejection fraction calculated at 57 %. Artifacts: None Non-reversible myocardial defects: None Reversible myocardial defects: None TID= 1.16 Study co-interpreted with Dr. Lattie Haw. ==================== MUSE REPORT START =================================== Resting Blood Pressure 174/84 Resting Heart Rate 76 Reason for Test: Screening for CAD Functional Capacity: Could Not Be Adequately Assessed Resting ECG: Sinus Rhythm anterior Q waves NON-SPECIFIC T-W ABNORMALITY ST Changes: none Reason For Termination: Completed Protocol HR Response To Exercise: appropriate BP Response To Exercise: resting hypertension - appropriate  response Max. Systolic BP: 123XX123 mmHg Max Diastolic BP: XX123456 mmHg Max Heart Rate: 116 BPM Duke Treadmill Score: Duke TM Score Result: PeakEx METs: 1.0 METS Protocol Name: Lexiscan Known Cardiac Condition: Attending Physician: Lance Bosch PA Acquisition Time: 2019-07-22  09:45:02 Total Exercise Time: 00:02:00 Test Indications: Screening for CAD Medications: ALLOPURINOL               AMLODIPINE               NEURONTIN               METOPROLOL SUCCINATE               FLOMAX               RESTORIL Protocol:  Lexiscan Max HR: 116 BPM  80% of  Pred: 144 BPM Max BP: 208/171 mmHG Max Work Load: 1.0 METS Confirmed by Massie Maroon, M.D., Synetta Shadow ([id number]) on 07/22/2019 11:44:12 AM Referred By:             Confirmed [id number] Massie Maroon, M.D. ==================== MUSE REPORT END ===================================     IMPRESSION: 1.  No evidence of reversible myocardial ischemia. 2. This is a low risk study.     CT CHEST W CON PE PROTOCOL    Result Date: 07/21/2019  EXAM:  CT CHEST W CON PE PROTOCOL INDICATION: Chest pain, elevated D-dimer. COMPARISON: 07/21/2019 TECHNIQUE: Multiplanar CT angiography of the chest was performed after administering nonionic intravenous contrast. 3D MIP reformats were performed. All CT exams at this facility use one or more dose reduction techniques including automatic exposure control, mA/kV adjustment per patient's size, or iterative reconstruction technique. FINDINGS: CONTRAST BOLUS: Satisfactory. PULMONARY ARTERIES: No  evidence of pulmonary embolism. LUNGS: Patchy perihilar groundglass and interlobular and intralobular septal thickening compatible with congestive failure. Scattered bilateral micronodules, largest measures approximately 3 mm in the lingula (46 series 4). Bibasilar atelectasis. CENTRAL AIRWAYS: Patent. PLEURA: No pleural effusions. MEDIASTINUM AND HILA: No lymphadenopathy. HEART: Cardiomegaly. No pericardial effusion. Stable postoperative changes of prior CABG. VESSELS: Atherosclerosis. CHEST  WALL: Median sternotomy. BONES: No acute fracture or aggressive osseous lesion. UPPER ABDOMEN: Trace pneumobilia, stable in appearance.     IMPRESSION: 1. No pulmonary embolism. 2. Mild congestive failure. WORKSTATION ID: [id number]       Echo Results  (Last 48 hours)    None          History of presenting illness:( Per admitting M.D.)  Tommy Rutter Sr. is a 74 y.o. male with a history of CAD status post CABG in 1995, hypertension, gout presented to hospital with complaint of chest pain.  Patient says off-and-on chest pain going on for few weeks.  Has worse episode yesterday.  Pain was in the left side.  8 out of 10.  With some radiation to left arm.  No particular aggravating factor.  Pain lasted over 2 hours.  He went to sleep  with some improvement.  Woke up this morning and chest still not back to normal.  He went to see his doctor for testosterone injection and was referred to ER because of the chest pain.  Patient said he has not seen his cardiologist more than a year.  Denies any current shortness of breath.  Denies any fever.  No cough.    Hospital course:   Chest pain possibly GERD.  Acute MI ruled out  History of CAD s/p CABG  Hypertension  Elevated D-dimer PE ruled out  Gout  ??  ??  ??  Plan:   With history of CAD status post CABG presenting with chest pain. ??EKG reviewed no acute ischemia. ??CT of the chest negative for PE.    Troponins negative.  Stress test was done which shows low risk.  Chest pain has completely resolved.  Continue aspirin and Toprol-XL.  Patient elevated dimer was CT negative for PE.  Blood pressure stable with Toprol-XL and Norvasc.  Lipid panel reviewed.  LDL 56.  Continue Flomax.  Continue allopurinol.  Patient advised to follow-up with cardiology as outpatient.  Given referral.  Advised to return to hospital if new or worsening symptoms.          Vitals on Discharge:  Visit Vitals  BP (!) 143/79 (BP 1 Location: Left arm, BP Patient Position: Supine)   Pulse 65   Temp 97.9 ??F  (36.6 ??C)   Resp 18   Wt 62.1 kg (136 lb 14.5 oz)   SpO2 99%   BMI 22.10 kg/m??       Physical Exam:  Constitutional: Awake and alert, NAD  HENT: atraumatic, normocephalic, oropharynx clear    Eyes:   conjunctiva normal  Neck:   supple and trachea normal  Cardiovascular:  Regular rate and rhythm, heart sounds normal, intact distal pulses  Pulmonary/Chest Wall: breath sounds normal and effort normal  Abdominal:      appearance normal, soft, non-tender upon palpation,bowel sounds normal.  Neurological:   awake, alert and oriented, CN intact, no focal neuro deficits, moves all extremities equally .  Extremities:   No clubbing or cyanosis. Pedal pulses 2+ b/l.       Discharge condition:  improved    Discharge activity and restrictions: Activity as tolerated    Time spent with discharging patient:   Discharge plan discussed with pt. All questions answered. Need for compliance with medicine and follow up emphasized. Pt verbalized the understanding of care. Time spent 35 minutes.      Clearnce Sorrel, MD  07/23/2019  11:12 AM    Copies: Daralene Milch, MD

## 2019-07-23 NOTE — Progress Notes (Signed)
Complete echocardiogram completed

## 2019-07-23 NOTE — Progress Notes (Signed)
 Complete echocardiogram completed

## 2019-07-23 NOTE — Discharge Summary (Signed)
DISCHARGE SUMMARY     Patient Name: Tommy Brown  Medical Record Number: [id number]  Date of Birth: 11/17/1945  Discharge Provider: Clearnce Sorrel, MD  Primary Care Provider: Daralene Milch, MD    Admit date: 07/21/2019  Discharge Date:  Aug 07, 2019 Time: 11:12 AM  Discharge Disposition: Home  Code Status: Partial Code    Follow-up appointments:     Follow-up Information     Follow up With Specialties Details Why Contact Info    Deniece Ree, MD Internal Medicine Schedule an appointment as soon as possible for a visit in 4 weeks Patient to make appointment. [address]  [address]  [place] [address]  [phone]      [place], [place]. Shahid, MD Cardiology Schedule an appointment as soon as possible for a visit in 3 weeks Patient to make appointment. [address]  [address]  [place] [address]  [phone]      Daralene Milch, MD Family Medicine   [address]  [address]  [place] [address]  [phone]             Follow-up recommendations:   Follow-up with primary care physician, cardiology and oncology.  Discharge diagnosis:  1. Primary diagnosis           Acute chest pain                    Hospital Problems as of 2019-08-07 Date Reviewed: Aug 07, 2019        Codes Class Noted - Resolved POA    * (Principal) Acute chest pain ICD-10-CM: R07.9  ICD-9-CM: 786.50  07/21/2019 - Present Unknown        History of coronary artery disease ICD-10-CM: Z86.79  ICD-9-CM: V12.59  07/21/2019 - Present Unknown                                            Past Medical History:   Diagnosis Date   ??? Arthritis    ??? Benign prostate hyperplasia    ??? Bladder infection    ??? Chronic kidney disease    ??? Coronary arteriosclerosis    ??? Coronary atherosclerosis of artery bypass graft    ??? Decreased testosterone level    ??? Diverticular disease    ??? Essential hypertension    ??? Gout    ??? History of acute renal failure    ??? History of anemia    ??? History of malignant neoplasm of pancreas    ??? Hx  of CABG    ??? Kidney stone    ??? Kidney stones    ??? Neuropathy    ??? Pancreatic cancer ([place])    ??? Sepsis ([place])    ??? Tremor                                                         Discharge Medications:                 Current Discharge Medication List      START taking these medications    Details   omeprazole (PRILOSEC) 40 mg capsule Take 1 Capsule by mouth daily.  Qty: 30  Capsule, Refills: 1  Start date: 07/23/2019         CONTINUE these medications which have NOT CHANGED    Details   bimatoprost (Lumigan) 0.01 % ophthalmic drops Lumigan 0.01 % eye drops      promethazine (PHENERGAN) 12.5 mg tablet promethazine 12.5 mg tablet   TAKE 1 TABLET BY MOUTH 2 TIMES PER DAY AS NEEDED      testosterone cypionate (DEPOTESTOTERONE CYPIONATE) 200 mg/mL injection INJECT 0.5 MILLILITER INTO THE MUSCLE EVERY 2 WEEKS DISCARD VIAL AFTER USE      metoprolol succinate (TOPROL-XL) 25 mg XL tablet TAKE 1 TABLET BY MOUTH EVERY DAY      lipase-protease-amylase (Creon) 24,000-76,000 -120,000 unit capsule Take 2 Capsules by mouth four (4) times daily (with meals).      temazepam (RESTORIL) 7.5 mg capsule Take  by mouth nightly.      tamsulosin (Flomax) 0.4 mg capsule Take 2 Caps by mouth daily.  Qty: 60 Cap, Refills: 1      peg 400-propylene glycol (Systane, propylene glycol,) 0.4-0.3 % drop Administer 2 Drops to both eyes daily. Patient places two drops in both eyes every AM.   Indications: dry eye      gabapentin (NEURONTIN) 600 mg tablet Take 600 mg by mouth two (2) times a day.      amLODIPine (NORVASC) 5 mg tablet Take 5 mg by mouth daily.      allopurinol (ZYLOPRIM) 300 mg tablet Take  by mouth daily.      aspirin 81 mg chewable tablet Take 81 mg by mouth daily.      testosterone (ANDROGEL) 20.25 mg/1.25 gram (1.62 %) gel Apply 20 mg to affected area daily. Max Daily Amount: 20 mg.  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  Qty: 40 Cap, Refills: 0             Discharge Diet:            Diet: Cardiac Diet    Consultants: None    Procedures:   * No surgery found *        Most Recent BMP and CBC:    Recent Labs     07/22/19  0723   BUN 18   NA 143   CO2 24     Recent Labs     07/22/19  0723   WBC 5.9   RBC 4.42   HCT 41.1   MCV 93.0   MCH 30.1   MCHC 32.4       Imaging:    XR CHEST PA LAT    Result Date: 07/21/2019  INDICATION: Chest pain. TECHNIQUE: PA/lateral Chest Radiograph. COMPARISON: 02/08/2019     IMPRESSION: Heart size upper normal. Patient status post sternotomy. Mild bibasilar atelectatic change. No acute infiltrate.     NM CARDIAC SPECT W STRS/REST MULT    Result Date: 07/22/2019  EXAMINATION: NM CARDIAC SPECT W STRS/REST MULT CLINICAL INDICATION: Chest pain, history of CABG. COMPARISON: None TECHNIQUE: LEXISCAN (0.4mg Lexiscan given IV per protocol.) rest -stress protocol. After injection of 11 mCi TC 30m labeled Sestamibi IV, resting CT attenuation corrected SPECT images of the heart were acquired.  During peak stress,  33 mCi of TC 51m labeled Sestamibi IV was injected and gated SPECT images of the heart were obtained and viewed in  short, vertical and horizontal axis. DEMOGRAPHICS: Height 5' 6 , Weight 140 lbs. MYOCARDIAL PERFUSION FINDINGS: Wall Motion: Normal. Ejection fraction calculated at 57 %. Artifacts: None Non-reversible myocardial defects: None Reversible myocardial defects: None TID= 1.16 Study co-interpreted with Dr. Lattie Haw. ==================== MUSE REPORT START =================================== Resting Blood Pressure 174/84 Resting Heart Rate 76 Reason for Test: Screening for CAD Functional Capacity: Could Not Be Adequately Assessed Resting ECG: Sinus Rhythm anterior Q waves NON-SPECIFIC T-W ABNORMALITY ST Changes: none Reason For Termination: Completed Protocol HR Response To Exercise: appropriate BP Response To Exercise: resting hypertension - appropriate  response Max. Systolic BP: 123XX123 mmHg Max Diastolic BP: XX123456 mmHg Max Heart Rate: 116 BPM Duke Treadmill Score: Duke TM Score Result: PeakEx METs: 1.0 METS Protocol Name: Lexiscan Known Cardiac Condition: Attending Physician: Lance Bosch PA Acquisition Time: 2019-07-22  09:45:02 Total Exercise Time: 00:02:00 Test Indications: Screening for CAD Medications: ALLOPURINOL               AMLODIPINE               NEURONTIN               METOPROLOL SUCCINATE               FLOMAX               RESTORIL Protocol:  Lexiscan Max HR: 116 BPM  80% of  Pred: 144 BPM Max BP: 208/171 mmHG Max Work Load: 1.0 METS Confirmed by Massie Maroon, M.D., Synetta Shadow ([id number]) on 07/22/2019 11:44:12 AM Referred By:             Confirmed [id number] Massie Maroon, M.D. ==================== MUSE REPORT END ===================================     IMPRESSION: 1.  No evidence of reversible myocardial ischemia. 2. This is a low risk study.     CT CHEST W CON PE PROTOCOL    Result Date: 07/21/2019  EXAM:  CT CHEST W CON PE PROTOCOL INDICATION: Chest pain, elevated D-dimer. COMPARISON: 07/21/2019 TECHNIQUE: Multiplanar CT angiography of the chest was performed after administering nonionic intravenous contrast. 3D MIP reformats were performed. All CT exams at this facility use one or more dose reduction techniques including automatic exposure control, mA/kV adjustment per patient's size, or iterative reconstruction technique. FINDINGS: CONTRAST BOLUS: Satisfactory. PULMONARY ARTERIES: No  evidence of pulmonary embolism. LUNGS: Patchy perihilar groundglass and interlobular and intralobular septal thickening compatible with congestive failure. Scattered bilateral micronodules, largest measures approximately 3 mm in the lingula (46 series 4). Bibasilar atelectasis. CENTRAL AIRWAYS: Patent. PLEURA: No pleural effusions. MEDIASTINUM AND HILA: No lymphadenopathy. HEART: Cardiomegaly. No pericardial effusion. Stable postoperative changes of prior CABG. VESSELS: Atherosclerosis. CHEST  WALL: Median sternotomy. BONES: No acute fracture or aggressive osseous lesion. UPPER ABDOMEN: Trace pneumobilia, stable in appearance.     IMPRESSION: 1. No pulmonary embolism. 2. Mild congestive failure. WORKSTATION ID: [id number]       Echo Results  (Last 48 hours)    None          History of presenting illness:( Per admitting M.D.)  Tommy Rutter Sr. is a 74 y.o. male with a history of CAD status post CABG in 1995, hypertension, gout presented to hospital with complaint of chest pain.  Patient says off-and-on chest pain going on for few weeks.  Has worse episode yesterday.  Pain was in the left side.  8 out of 10.  With some radiation to left arm.  No particular aggravating factor.  Pain lasted over 2 hours.  He went to sleep  with some improvement.  Woke up this morning and chest still not back to normal.  He went to see his doctor for testosterone injection and was referred to ER because of the chest pain.  Patient said he has not seen his cardiologist more than a year.  Denies any current shortness of breath.  Denies any fever.  No cough.    Hospital course:   Chest pain possibly GERD.  Acute MI ruled out  History of CAD s/p CABG  Hypertension  Elevated D-dimer PE ruled out  Gout  ??  ??  ??  Plan:   With history of CAD status post CABG presenting with chest pain. ??EKG reviewed no acute ischemia. ??CT of the chest negative for PE.    Troponins negative.  Stress test was done which shows low risk.  Chest pain has completely resolved.  Continue aspirin and Toprol-XL.  Patient elevated dimer was CT negative for PE.  Blood pressure stable with Toprol-XL and Norvasc.  Lipid panel reviewed.  LDL 56.  Continue Flomax.  Continue allopurinol.  Patient advised to follow-up with cardiology as outpatient.  Given referral.  Advised to return to hospital if new or worsening symptoms.          Vitals on Discharge:  Visit Vitals  BP (!) 143/79 (BP 1 Location: Left arm, BP Patient Position: Supine)   Pulse 65   Temp 97.9 ??F  (36.6 ??C)   Resp 18   Wt 62.1 kg (136 lb 14.5 oz)   SpO2 99%   BMI 22.10 kg/m??       Physical Exam:  Constitutional: Awake and alert, NAD  HENT: atraumatic, normocephalic, oropharynx clear    Eyes:   conjunctiva normal  Neck:   supple and trachea normal  Cardiovascular:  Regular rate and rhythm, heart sounds normal, intact distal pulses  Pulmonary/Chest Wall: breath sounds normal and effort normal  Abdominal:      appearance normal, soft, non-tender upon palpation,bowel sounds normal.  Neurological:   awake, alert and oriented, CN intact, no focal neuro deficits, moves all extremities equally .  Extremities:   No clubbing or cyanosis. Pedal pulses 2+ b/l.       Discharge condition:  improved    Discharge activity and restrictions: Activity as tolerated    Time spent with discharging patient:   Discharge plan discussed with pt. All questions answered. Need for compliance with medicine and follow up emphasized. Pt verbalized the understanding of care. Time spent 35 minutes.      Clearnce Sorrel, MD  07/23/2019  11:12 AM    Copies: Daralene Milch, MD

## 2019-07-28 ENCOUNTER — Encounter: Attending: Urology | Primary: Family Medicine

## 2019-07-28 ENCOUNTER — Encounter: Payer: MEDICARE | Attending: Urology | Primary: Family Medicine

## 2019-09-08 ENCOUNTER — Ambulatory Visit: Attending: Physician Assistant | Primary: Family Medicine

## 2019-09-08 ENCOUNTER — Ambulatory Visit: Admit: 2019-09-08 | Discharge: 2019-09-08 | Attending: Physician Assistant | Primary: Family Medicine

## 2019-09-08 DIAGNOSIS — N200 Calculus of kidney: Secondary | ICD-10-CM

## 2019-09-08 LAB — AMB POC URINALYSIS DIP STICK AUTO W/O MICRO
Bilirubin (UA POC): NEGATIVE
Bilirubin, Urine, POC: NEGATIVE
Glucose (UA POC): NEGATIVE
Glucose, Urine, POC: NEGATIVE
Ketones (UA POC): NEGATIVE
Ketones, Urine, POC: NEGATIVE
Nitrite, Urine, POC: NEGATIVE
Nitrites (UA POC): NEGATIVE
Specific Gravity, Urine, POC: 1.02 NA (ref 1.001–1.035)
Specific gravity (UA POC): 1.02 (ref 1.001–1.035)
Urobilinogen (UA POC): 0.2 (ref 0.2–1)
Urobilinogen, POC: 0.2 (ref 0.2–1)
pH (UA POC): 5.5 (ref 4.6–8.0)
pH, Urine, POC: 5.5 NA (ref 4.6–8.0)

## 2019-09-08 LAB — AMB POC PVR, MEAS,POST-VOID RES,US,NON-IMAGING
PVR POC: 2 cc
PVR: 2 cc

## 2019-09-08 NOTE — Progress Notes (Signed)
Tommy Brown has order for RUS      To be done at [facility]    Needed by:  November 10, 2019    Patient has a follow-up appointment:  Yes  November 17, 2019    If MRI, does patient have a pacemaker:   No    Order has been placed in connect care:  Yes    Is this a STAT order:  No      [employer]

## 2019-09-08 NOTE — Progress Notes (Signed)
Imaging placed in future folder.  Imaging will be sent out 1 month prior to f/u appt unless noted otherwise; rus; needed by 11/10/19

## 2019-09-08 NOTE — Progress Notes (Signed)
Faxed imaging to [facility] [phone]; rus; needed by 11/10/19

## 2019-09-08 NOTE — Progress Notes (Signed)
Negative urine culture.

## 2019-09-08 NOTE — Progress Notes (Signed)
Progress Notes by Margarito Liner, [place] at 09/08/19 1100                Author: Margarito Liner, [place]  Service: --  Author Type: Physician Assistant       Filed: 09/08/19 1224  Encounter Date: 09/08/2019  Status: Signed          Editor: Margarito Liner, [place] (Physician Assistant)               Tommy Brown   1945/04/01   Encounter date: 09/08/2019              Follow-up Visit        Encounter Diagnoses              ICD-10-CM  ICD-9-CM          1.  Kidney stone   N20.0  592.0     2.  BPH with obstruction/lower urinary tract symptoms   N40.1  600.01           N13.8  599.69            ASSESSMENT & PLAN:       Left Flank pain - resolved.        nephrolithiasis     Surgical History:     S/p URS with LL in 02/2017, for a left 6mm proximal ureteral stone with hydroureteronephrosis and 49mm LLP stone    S/p left jj stent on 06/22/19 with Dr. Claiborne Billings     S/p Cystoscopy, left retrograde pyelogram, left ureteroscopy, holmium laser lithotripsy of proximal ureteral stone and extraction of left renal calculi and left double-J ureteral  stent exchange on 07/31/18, string with stent removed on 08/06/18.        Medical Therapy:  n/a    Stone Composition: calcium oxalate 100% 07/31/18.     24 Hr Urine Panel: n/a        Imaging:     RUS 01/13/19: Suboptimal exam. No hydronephrosis. Bilateral renal calcifications versus   nonobstructing calculi.     CT A/P 06/22/18: Multiple small (2 to 4 mm) nonobstructing bilateral renal calculi. There is a 10 x 9 mm nonobstructing   calculus in the lower pole of the left kidney. Moderate left hydronephrosis and hydroureter. There is a 8 x 7 mm obstructing calculus in the proximal left ureter. No right hydronephrosis. No suspicious renal lesions.         Hx of Pancreatic cancer s/p Whipple in 2006, chemotherapy and radiation??   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        Plan:    Urinalysis today 3+ blood, trace leukocytes.  Will send for culture.   Discussed dietary stone prevention measures.   We will plan for  to ask [facility]'s.   RTC in 8-12 weeks for review of [facility] and renal ultrasound.        Discussion:    Empiric Stone Prevention Recommendations were discussed:    ??  Increase fluid consumption to make at least 2-2.5 litre of urine per day or consume at least 3 liters of fluid daily.    ??  Decrease dietary salt intake (2 gm sodium daily) -- to lower urinary Calcium    ??  Decrease meat intake to 6-8oz per day    ??  Avoid dark drinks (ie, Coffee, Tea, Colas), nuts, chocolate -- to lower urinary Oxalate    ??  Add crystal light lemonade, lemon/lime or litholyte packets-- to increase urinary Citrate  Chief Complaint       Patient presents with        ?  Kidney Stone             Pt here to f/u. Pt passed kidney stone in office during urine sample         ?  Benign Prostatic Hypertrophy            HPI: Tommy Brown is a  74 y.o. WHITE/NON-HISPANIC male   who presents today for further evaluation of flank pain, he is an established pt of Dr. Donovan Kail, most recently seen in office on 05/03/19, followed for hypogonadism, kidney stones, and prostate cancer screening. He is most recently S/p Cystoscopy, left  retrograde pyelogram, left ureteroscopy, holmium laser lithotripsy of proximal ureteral stone and extraction of left renal calculi and left double-J ureteral stent exchange on 07/31/18.      Pt noted of some left sided flank pain starting since 4 days ago, suspected that he was starting to pass a stone.  Denies any associated fevers, chills, dysuria, nausea, vomiting with this discomfort.  Has been taking some Tylenol with some benefit.   Does note that just prior to our office visit, he passed proximately 6-7 mm stone when he voided. Pt noted that as it 'dropped' this morning and knew that he was going to pass it. After passing the stone, notes resolution of his flank pain.       H/o pancreatic cancer.       LABS / IMAGING:        Lab Results         Component  Value  Date/Time            Prostate Specific Ag  0.6   04/30/2018 12:00 AM            Prostate Specific Ag  0.5  08/25/2013 12:00 AM           AUA Assessment Score:    ;     AUA Bother Rating:          Current Outpatient Medications          Medication  Sig  Dispense  Refill           ?  atorvastatin (LIPITOR) 20 mg tablet  TAKE 1 TABLET BY MOUTH EVERY DAY         ?  diclofenac (VOLTAREN) 1 % gel  diclofenac 1 % topical gel    APPLY 2 GRAMS TOPICALLY TO THE AFFECTED AREAS 4 TIMES EACH DAY               ?  erythromycin (ILOTYCIN) ophthalmic ointment  APPLY 1 A SMALL AMOUNT INTO RIGHT EYE THREE TIMES A DAY AS DIRECTED               ?  lidocaine (XYLOCAINE) 5 % ointment  lidocaine 5 % topical ointment    USE AS DIRECTED.         ?  naproxen (NAPROSYN) 500 mg tablet  TAKE 1 TABLET BY MOUTH TWICE A DAY WITH FOOD         ?  triamcinolone acetonide (KENALOG) 0.1 % topical cream  triamcinolone acetonide 0.1 % topical cream         ?  omeprazole (PRILOSEC) 40 mg capsule  Take 1 Capsule by mouth daily.  30 Capsule  1     ?  bimatoprost (Lumigan) 0.01 % ophthalmic drops  Lumigan 0.01 %  eye drops         ?  testosterone cypionate (DEPOTESTOTERONE CYPIONATE) 200 mg/mL injection  INJECT 0.5 MILLILITER INTO THE MUSCLE EVERY 2 WEEKS DISCARD VIAL AFTER USE         ?  metoprolol succinate (TOPROL-XL) 25 mg XL tablet  TAKE 1 TABLET BY MOUTH EVERY DAY         ?  lipase-protease-amylase (Creon) 24,000-76,000 -120,000 unit capsule  Take 2 Capsules by mouth four (4) times daily (with meals).         ?  temazepam (RESTORIL) 7.5 mg capsule  Take  by mouth nightly.         ?  tamsulosin (Flomax) 0.4 mg capsule  Take 2 Caps by mouth daily.  60 Cap  1     ?  lactobacillus rhamnosus gg 10 billion cell (Culturelle) 10 billion cell capsule  Take 1 Cap by mouth Before breakfast and dinner.  40 Cap  0     ?  gabapentin (NEURONTIN) 600 mg tablet  Take 600 mg by mouth two (2) times a day.         ?  amLODIPine (NORVASC) 5 mg tablet  Take 5 mg by mouth daily.         ?  allopurinol (ZYLOPRIM) 300 mg  tablet  Take  by mouth daily.         ?  aspirin 81 mg chewable tablet  Take 81 mg by mouth daily.         ?  amoxicillin (AMOXIL) 875 mg tablet  TAKE 1 TABLET BY MOUTH EVERY 12 HOURS FOR 10 DAYS (Patient not taking: Reported on 09/08/2019)         ?  tiZANidine (ZANAFLEX) 4 mg tablet  tizanidine 4 mg tablet    TAKE 1 TABLET BY MOUTH EACH DAY AT BEDTIME AS NEEDED (Patient not taking: Reported on 09/08/2019)         ?  testosterone (ANDROGEL) 20.25 mg/1.25 gram (1.62 %) gel  Apply 20 mg to affected area daily. Max Daily Amount: 20 mg. (Patient not taking: Reported on 09/08/2019)  6 Bottle  2           ?  promethazine (PHENERGAN) 12.5 mg tablet  promethazine 12.5 mg tablet    TAKE 1 TABLET BY MOUTH 2 TIMES PER DAY AS NEEDED (Patient not taking: Reported on 09/08/2019)               ?  peg 400-propylene glycol (Systane, propylene glycol,) 0.4-0.3 % drop  Administer 2 Drops to both eyes daily. Patient places two drops in both eyes every AM.   Indications: dry eye (Patient not taking: Reported on 09/08/2019)               All:     Allergies        Allergen  Reactions         ?  Other Food  Other (comments)             VISTAID   ANESTHESIA         ?  Codeine  Itching and Swelling     ?  Oxycodone  Anaphylaxis     ?  Hydroxyzine Pamoate  Itching and Other (comments)             Other reaction(s): Unknown            ?  Meperidine  Itching, Palpitations, Swelling and Other (comments)  Other reaction(s): Unknown            ?  Metoclopramide  Itching, Other (comments) and Swelling             Other reaction(s): Other (See Comments)   Reaction unknown    Reaction unknown             ?  Morphine  Other (comments) and Nausea and Vomiting     ?  Oxycodone-Acetaminophen  Itching     ?  Prochlorperazine  Itching, Other (comments) and Unknown (comments)             Other reaction(s): Unknown   Unsure of reaction            ?  Hydroxyzine Hcl  Hives             Past Medical History:        Diagnosis  Date         ?  Arthritis        ?  Benign prostate hyperplasia       ?  Bladder infection       ?  Chronic kidney disease       ?  Coronary arteriosclerosis       ?  Coronary atherosclerosis of artery bypass graft       ?  Decreased testosterone level       ?  Diverticular disease       ?  Essential hypertension       ?  Gout       ?  History of acute renal failure       ?  History of anemia       ?  History of malignant neoplasm of pancreas       ?  Hx of CABG       ?  Kidney stone       ?  Kidney stones       ?  Neuropathy       ?  Pancreatic cancer ([place])       ?  Sepsis ([place])           ?  Tremor               Past Surgical History:         Procedure  Laterality  Date          ?  HX CERVICAL FUSION         ?  HX COLONOSCOPY         ?  HX CORONARY ARTERY BYPASS GRAFT    1995     ?  HX HEART CATHETERIZATION    2006     ?  HX ORTHOPAEDIC              back          ?  HX OTHER SURGICAL    2006          whipple procedure done for pancreatic cancer          ?  HX UROLOGICAL    2012          Percutaneous extraction of a kidney stone w/ fragmentation procedure            Review of Systems   Constitutional: Fever: No   Skin: Rash: No   HEENT: Hearing difficulty: No   Eyes: Blurred vision: No   Cardiovascular: Chest pain: No   Respiratory: Shortness  of breath: No   Gastrointestinal: Nausea/vomiting: No   Musculoskeletal: Back pain: No   Neurological: Weakness: No   Psychological: Memory loss: No   Comments/additional findings:       Any elements of the PMFSHx, ROS, or preliminary elements of the HPI that were entered by a medical assistant have been reviewed in full.      PHYSICAL EXAM:     Visit Vitals      Ht  5\' 6"  (1.676 m)     Wt  136 lb (61.7 kg)        BMI  21.95 kg/m??        Constitutional: WDWN, Pleasant and appropriate affect, No acute distress .     CV:  No peripheral swelling noted   Respiratory: No respiratory distress or difficulties   Abdomen:  No abdominal masses or tenderness.   No CVA tenderness. No hernias noted.    GU Male: No CVA  tenderness present.     DRE: 05/03/19 Perineum normal to visual inspection, no erythema or irritation, Sphincter with good tone, Rectum with no hemorrhoids, fissures or masses.  Prostate is 15-20 grams, smooth, no nodule. SCROTUM:  No scrotal rash or lesions noticed.  Normal bilateral testes and epididymis.  PENIS: Urethral meatus normal in location and size. No urethral discharge. IPP in  place.    Skin: No jaundice.     Neuro/Psych:  Alert and o riented x 3. Affect  appropriate.    Lymphatic:   No enlarged inguinal lymph nodes.        Body mass index is 21.95 kg/m??. Patient's BMI is out of the normal parameters.  Information about BMI was given and patient was advised to  follow-up with their PCP for further management.       Labs Today/imaging:       RUS 01/13/19   The right kidney measures 9.9 x 6.5 x 4.3 cm. The left kidney measures 11.2 x   7.4 x 4.1 cm. There is no hydronephrosis or nephrolithiasis. There are bilateral   renal cysts measuring up to 1.9 x 1.4 x 1.7 cm within the upper pole of the   right kidney and 1.7 x 1.5 x 1.7 cm within the lower pole of the left kidney.   The kidneys are echogenic bilaterally. There are bilateral renal cortical   calcifications.   ??   Bladder volume measures 54 cc. The bladder is suboptimally distended.   ??   Incidentally noted is splenomegaly and an echogenic liver.   ??   IMPRESSION:   ??   1.  Suboptimal exam. No hydronephrosis. Bilateral renal calcifications versus   nonobstructing calculi.   2.  Bilateral renal cysts.   3.  Splenomegaly.   4.  Hepatic steatosis     Results for orders placed or performed in visit on 09/08/19     AMB POC URINALYSIS DIP STICK AUTO W/O MICRO         Result  Value  Ref Range            Color (UA POC)           Clarity (UA POC)           Glucose (UA POC)  Negative  Negative       Bilirubin (UA POC)  Negative  Negative       Ketones (UA POC)  Negative  Negative       Specific gravity (UA POC)  1.020  1.001 -  1.035       Blood (UA POC)  3+   Negative       pH (UA POC)  5.5  4.6 - 8.0       Protein (UA POC)  1+  Negative       Urobilinogen (UA POC)  0.2 mg/dL  0.2 - 1       Nitrites (UA POC)  Negative  Negative       Leukocyte esterase (UA POC)  Trace  Negative       AMB POC PVR, MEAS,POST-VOID RES,US,NON-IMAGING         Result  Value  Ref Range            PVR  2  cc            Margarito Liner PA-C    [facility] [place].    [place], [place]   [phone] (office)

## 2019-09-10 LAB — URINE C&S

## 2019-10-05 NOTE — Progress Notes (Signed)
[facility] 09/27/19: Very low urine volum e(1.92), very low urinary citrate (99), very elevated urinary sodium (324), mildly low urinary pH (5.65).     Will review findings at follow-up appointment.

## 2019-10-26 ENCOUNTER — Inpatient Hospital Stay: Admit: 2019-10-26 | Payer: MEDICARE | Attending: Physician Assistant | Primary: Family Medicine

## 2019-10-26 DIAGNOSIS — N200 Calculus of kidney: Secondary | ICD-10-CM

## 2019-10-26 NOTE — Progress Notes (Signed)
Bilateral kidney stones noted, Right: up to 9 mm, and left, up to 6 mm.   B/l uncomplicated cysts.     Can discuss definitive stone surgery at follow-up visit, vs. Monitoring stones.

## 2019-10-28 ENCOUNTER — Encounter

## 2019-11-03 ENCOUNTER — Encounter

## 2019-11-03 ENCOUNTER — Inpatient Hospital Stay: Admit: 2019-11-03 | Payer: MEDICARE | Attending: Family | Primary: Family Medicine

## 2019-11-03 DIAGNOSIS — Z8507 Personal history of malignant neoplasm of pancreas: Secondary | ICD-10-CM

## 2019-11-03 LAB — CREATININE, POC: Creatinine: 1.4 mg/dl — ABNORMAL HIGH (ref 0.6–1.3)

## 2019-11-03 LAB — AMB POC CREATININE: Creatinine: 1.4 mg/dl — ABNORMAL HIGH (ref 0.6–1.3)

## 2019-11-03 MED ORDER — IOPAMIDOL 61 % IV SOLN
300 mg iodine /mL (61 %) | Freq: Once | INTRAVENOUS | Status: AC
Start: 2019-11-03 — End: 2019-11-03
  Administered 2019-11-03: 18:00:00 via INTRAVENOUS

## 2019-11-03 MED FILL — ISOVUE-300  61 % INTRAVENOUS SOLUTION: 300 mg iodine /mL (61 %) | INTRAVENOUS | Qty: 80

## 2019-11-17 ENCOUNTER — Ambulatory Visit: Attending: Physician Assistant | Primary: Family Medicine

## 2019-11-17 ENCOUNTER — Ambulatory Visit: Admit: 2019-11-17 | Discharge: 2019-11-17 | Attending: Physician Assistant | Primary: Family Medicine

## 2019-11-17 DIAGNOSIS — N200 Calculus of kidney: Secondary | ICD-10-CM

## 2019-11-17 LAB — AMB POC URINALYSIS DIP STICK AUTO W/O MICRO
Bilirubin (UA POC): NEGATIVE
Bilirubin, Urine, POC: NEGATIVE
Blood (UA POC): NEGATIVE
Blood (UA POC): NEGATIVE
Glucose (UA POC): NEGATIVE
Glucose, Urine, POC: NEGATIVE
Ketones (UA POC): NEGATIVE
Ketones, Urine, POC: NEGATIVE
Leukocyte Esterase, Urine, POC: NEGATIVE
Leukocyte esterase (UA POC): NEGATIVE
Nitrite, Urine, POC: NEGATIVE
Nitrites (UA POC): NEGATIVE
Protein (UA POC): NEGATIVE
Protein, Urine, POC: NEGATIVE
Specific Gravity, Urine, POC: 1.02 NA (ref 1.001–1.035)
Specific gravity (UA POC): 1.02 (ref 1.001–1.035)
Urobilinogen (UA POC): 0.2 (ref 0.2–1)
Urobilinogen, POC: 0.2 (ref 0.2–1)
pH (UA POC): 5 (ref 4.6–8.0)
pH, Urine, POC: 5 NA (ref 4.6–8.0)

## 2019-11-17 LAB — AMB POC PVR, MEAS,POST-VOID RES,US,NON-IMAGING
PVR POC: 0 cc
PVR: 0 cc

## 2019-11-17 NOTE — Progress Notes (Signed)
Progress Notes by Margarito Liner, [place] at 11/17/19 1300                Author: Margarito Liner, [place]  Service: --  Author Type: Physician Assistant       Filed: 11/17/19 1341  Encounter Date: 11/17/2019  Status: Signed          Editor: Margarito Liner, [place] (Physician Assistant)               Tommy Brown   08-26-1945   Encounter date: 11/17/2019              Follow-up Visit        Encounter Diagnoses              ICD-10-CM  ICD-9-CM          1.  Kidney stone   N20.0  592.0     2.  BPH with obstruction/lower urinary tract symptoms   N40.1  600.01           N13.8  599.69            ASSESSMENT        nephrolithiasis     Surgical History:     S/p URS with LL in 02/2017, for a left 42mm proximal ureteral stone with hydroureteronephrosis and 81mm LLP stone    S/p left jj stent on 06/22/19 with Dr. Claiborne Billings     S/p Cystoscopy, left retrograde pyelogram, left ureteroscopy, holmium laser lithotripsy of proximal ureteral stone and extraction of left renal calculi and left double-J ureteral  stent exchange on 07/31/18, string with stent removed on 08/06/18.        Medical Therapy:  n/a    Stone Composition: calcium oxalate 100% 07/31/18.     24 Hr Urine Panel: Litholink 09/27/19: Very low urine volume(1.92),  very low urinary citrate (99), very elevated urinary sodium (324), mildly low urinary pH (5.65).    ?? Serum calcium 07/21/2019 8.7, potassium 4.2.      Imaging:     RUS 01/13/19: Suboptimal exam. No hydronephrosis. Bilateral renal calcifications versus   nonobstructing calculi.     CT A/P 06/22/18: Multiple small (2 to 4 mm) nonobstructing bilateral renal calculi. There is a 10 x 9 mm nonobstructing   calculus in the lower pole of the left kidney. Moderate left hydronephrosis and hydroureter. There is a 8 x 7 mm obstructing calculus in the proximal left ureter. No right hydronephrosis. No suspicious renal lesions.     R [place] 09/08/2019: Right kidney 9 mm, 6 mm left kidney.  No evidence of hydronephrosis.  Uncomplicated cyst.    CT  A/P w/ contrast 10/28/19: KIDNEYS/URETERS: Multiple punctate (1 to 2 mm) bilateral nonobstructing calculi. Numerous bilateral simple cysts for which no follow-up is needed. No suspicious   masses. No hydronephrosis.      Hx of Pancreatic cancer s/p Whipple in 2006, chemotherapy and radiation??   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        Plan:    UA today no evidence of blood or infection, PVR 0 cc.   Reviewed most recent CT scanning done on 10/28/2019 (report only), done at [facility], cannot see images.   Inform patient and wife that renal ultrasound on 09/08/2019 likely will recall the size of the stone since CT is less definitive.,  No need for surgical intervention at this point given small stones on most recent CT scan.   Reviewed Litholink results with patient, advised patient  to increase urine volume >2.5 L/daily.  Also informed patient to lower sodium intake <2 g/daily.  Considered starting potassium citrate, however patient states that he has had problems with elevated  potassium in the past, from prior hospitalization.     Advised patient to increase dietary citrate with lemon/limes juice and water, Crystal light packets, increase fruits and vegetables in diet.  Decrease oxalate in diet as well, make sure he he has a normal amount of calcium [phone] mg daily.   RTC as scheduled with Dr. Donovan Kail 01/2020.         Discussion:    Empiric Stone Prevention Recommendations were discussed:    ??  Increase fluid consumption to make at least 2-2.5 litre of urine per day or consume at least 3 liters of fluid daily.    ??  Decrease dietary salt intake (2 gm sodium daily) -- to lower urinary Calcium    ??  Decrease meat intake to 6-8oz per day    ??  Avoid dark drinks (ie, Coffee, Tea, Colas), nuts, chocolate -- to lower urinary Oxalate    ??  Add crystal light lemonade, lemon/lime or litholyte packets-- to increase urinary Citrate        Chief Complaint       Patient presents with        ?  Kidney Stone             2 month f/u to review  Litholink and renal ultrasound        ?  Benign Prostatic Hypertrophy            HPI: Tommy Brown is a  74 y.o. WHITE/NON-HISPANIC male   who presents today for follow-up and metabolic work-up review for kidney stones. He is an established pt of Dr. Donovan Kail, followed for hypogonadism, kidney stones, and prostate cancer screening. He is most recently S/p Cystoscopy, left retrograde pyelogram,  left ureteroscopy, holmium laser lithotripsy of proximal ureteral stone and extraction of left renal calculi and left double-J ureteral stent exchange on 07/31/18.  Patient presents today for review of Litholink results.      At last visit with me on 09/08/19, pt noted that he passed a 6-7 mm stone when he voided right before his visit with me.        Pt underwent a CT A/P w/ contrast on 10/28/19 for pancreatic cancer follow-up imaging, h/o of whipple procedure showed bilateral  Punctate stones.     Denies interval stone event.  Doing well no urinary complaints. Denies flank pain, gross hematuria, dysuria, asymptomatic for infection. No f/c/n/v.       H/o pancreatic cancer, h/o of gout, on Allopurinol daily.       LABS / IMAGING:        Lab Results         Component  Value  Date/Time            Prostate Specific Ag  0.6  04/30/2018 12:00 AM            Prostate Specific Ag  0.5  08/25/2013 12:00 AM           AUA Assessment Score:    ;     AUA Bother Rating:          Current Outpatient Medications          Medication  Sig  Dispense  Refill           ?  atorvastatin (LIPITOR) 20 mg  tablet  TAKE 1 TABLET BY MOUTH EVERY DAY         ?  diclofenac (VOLTAREN) 1 % gel  diclofenac 1 % topical gel    APPLY 2 GRAMS TOPICALLY TO THE AFFECTED AREAS 4 TIMES EACH DAY         ?  omeprazole (PRILOSEC) 40 mg capsule  Take 1 Capsule by mouth daily.  30 Capsule  1     ?  bimatoprost (Lumigan) 0.01 % ophthalmic drops  Lumigan 0.01 % eye drops         ?  testosterone cypionate (DEPOTESTOTERONE CYPIONATE) 200 mg/mL injection  INJECT 0.5 MILLILITER INTO  THE MUSCLE EVERY 2 WEEKS DISCARD VIAL AFTER USE         ?  metoprolol succinate (TOPROL-XL) 25 mg XL tablet  TAKE 1 TABLET BY MOUTH EVERY DAY         ?  lipase-protease-amylase (Creon) 24,000-76,000 -120,000 unit capsule  Take 2 Capsules by mouth four (4) times daily (with meals).         ?  temazepam (RESTORIL) 7.5 mg capsule  Take  by mouth nightly.         ?  tamsulosin (Flomax) 0.4 mg capsule  Take 2 Caps by mouth daily.  60 Cap  1     ?  peg 400-propylene glycol (Systane, propylene glycol,) 0.4-0.3 % drop  Administer 2 Drops to both eyes daily. Patient places two drops in both eyes every AM.   Indications: dry eye         ?  gabapentin (NEURONTIN) 600 mg tablet  Take 600 mg by mouth two (2) times a day.         ?  amLODIPine (NORVASC) 5 mg tablet  Take 5 mg by mouth daily.         ?  allopurinol (ZYLOPRIM) 300 mg tablet  Take  by mouth daily.         ?  aspirin 81 mg chewable tablet  Take 81 mg by mouth daily.         ?  erythromycin (ILOTYCIN) ophthalmic ointment  APPLY 1 A SMALL AMOUNT INTO RIGHT EYE THREE TIMES A DAY AS DIRECTED         ?  lidocaine (XYLOCAINE) 5 % ointment  lidocaine 5 % topical ointment    USE AS DIRECTED.         ?  naproxen (NAPROSYN) 500 mg tablet  TAKE 1 TABLET BY MOUTH TWICE A DAY WITH FOOD         ?  tiZANidine (ZANAFLEX) 4 mg tablet  tizanidine 4 mg tablet    TAKE 1 TABLET BY MOUTH EACH DAY AT BEDTIME AS NEEDED (Patient not taking: Reported on 09/08/2019)         ?  triamcinolone acetonide (KENALOG) 0.1 % topical cream  triamcinolone acetonide 0.1 % topical cream         ?  testosterone (ANDROGEL) 20.25 mg/1.25 gram (1.62 %) gel  Apply 20 mg to affected area daily. Max Daily Amount: 20 mg. (Patient not taking: Reported on 09/08/2019)  6 Bottle  2     ?  promethazine (PHENERGAN) 12.5 mg tablet  promethazine 12.5 mg tablet    TAKE 1 TABLET BY MOUTH 2 TIMES PER DAY AS NEEDED (Patient not taking: Reported on 09/08/2019)               ?  lactobacillus rhamnosus gg 10 billion cell  (Culturelle) 10  billion cell capsule  Take 1 Cap by mouth Before breakfast and dinner.  40 Cap  0           All:     Allergies        Allergen  Reactions         ?  Other Food  Other (comments)             VISTAID   ANESTHESIA         ?  Codeine  Itching and Swelling     ?  Oxycodone  Anaphylaxis     ?  Hydroxyzine Pamoate  Itching and Other (comments)             Other reaction(s): Unknown            ?  Meperidine  Itching, Palpitations, Swelling and Other (comments)             Other reaction(s): Unknown            ?  Metoclopramide  Itching, Other (comments) and Swelling             Other reaction(s): Other (See Comments)   Reaction unknown    Reaction unknown             ?  Morphine  Other (comments) and Nausea and Vomiting     ?  Oxycodone-Acetaminophen  Itching     ?  Prochlorperazine  Itching, Other (comments) and Unknown (comments)             Other reaction(s): Unknown   Unsure of reaction            ?  Hydroxyzine Hcl  Hives             Past Medical History:        Diagnosis  Date         ?  Arthritis       ?  Benign prostate hyperplasia       ?  Bladder infection       ?  Chronic kidney disease       ?  Coronary arteriosclerosis       ?  Coronary atherosclerosis of artery bypass graft       ?  Decreased testosterone level       ?  Diverticular disease       ?  Essential hypertension       ?  Gout       ?  History of acute renal failure       ?  History of anemia       ?  History of malignant neoplasm of pancreas       ?  Hx of CABG       ?  Kidney stone       ?  Kidney stones       ?  Neuropathy       ?  Pancreatic cancer ([place])       ?  Sepsis ([place])           ?  Tremor               Past Surgical History:         Procedure  Laterality  Date          ?  HX CERVICAL FUSION         ?  HX COLONOSCOPY         ?  HX CORONARY ARTERY BYPASS GRAFT    1995     ?  HX HEART CATHETERIZATION    2006     ?  HX ORTHOPAEDIC              back          ?  HX OTHER SURGICAL    2006          whipple procedure done for  pancreatic cancer          ?  HX UROLOGICAL    2012          Percutaneous extraction of a kidney stone w/ fragmentation procedure            Review of Systems   Constitutional: Fever: No   Skin: Rash: No   HEENT: Hearing difficulty: No   Eyes: Blurred vision: No   Cardiovascular: Chest pain: No   Respiratory: Shortness of breath: No   Gastrointestinal: Nausea/vomiting: No   Musculoskeletal: Back pain: No   Neurological: Weakness: No   Psychological: Memory loss: No   Comments/additional findings:       Any elements of the PMFSHx, ROS, or preliminary elements of the HPI that were entered by a medical assistant have been reviewed in full.      PHYSICAL EXAM:     Visit Vitals      Resp  16     Ht  5\' 6"  (1.676 m)     Wt  137 lb (62.1 kg)        BMI  22.11 kg/m??        Constitutional: WDWN, Pleasant and appropriate affect, No acute distress .     CV:  No peripheral swelling noted   Respiratory: No respiratory distress or difficulties   Abdomen:  No abdominal masses or tenderness.   No CVA tenderness. No hernias noted.    GU Male: No CVA tenderness present.     DRE: 05/03/19 Perineum normal to visual inspection, no erythema or irritation, Sphincter with good tone, Rectum with no hemorrhoids, fissures or masses.  Prostate is 15-20 grams, smooth, no nodule. SCROTUM:  No scrotal rash or lesions noticed.  Normal bilateral testes and epididymis.  PENIS: Urethral meatus normal in location and size. No urethral discharge. IPP in  place.    Skin: No jaundice.     Neuro/Psych:  Alert and o riented x 3. Affect  appropriate.    Lymphatic:   No enlarged inguinal lymph nodes.        Body mass index is 22.11 kg/m??. Patient's BMI is out of the normal parameters.  Information about BMI was given and patient was advised to  follow-up with their PCP for further management.       Labs Today/imaging:       CT A/P w/ contrast on 10/28/19:             INDICATION: H/O pancreatic cancer; follow-up. History of Whipple procedure.   COMPARISON: Most  recently 06/22/2018.   TECHNIQUE: CT of the abdomen and pelvis with intravenous contrast. Coronal and   sagittal reformatted images were produced.   ??   All CT exams at this facility use one or more dose reduction techniques   including automatic exposure control, mA/kV adjustment per patient's size, or   iterative reconstruction technique.    ??   CONTRAST: 60 mL Isovue-300.   ??   COMMENTS:    ??   LOWER THORAX: Changes of prior CABG. Coronary  artery calcifications. Small   hiatal hernia.   ??   HEPATOBILIARY: Multiple subcentimeter hypoattenuating liver lesions, too small   to characterize. 11 mm simple cyst in the right hepatic dome and 11 mm simple   cyst in the lateral left hepatic lobe segment 2. These are all unchanged. No new   or suspicious liver lesions. Left intrahepatic pneumobilia. Cholecystectomy.   PANCREAS: Postoperative changes from prior Whipple procedure. No evidence of   residual or recurrent mass. No duct dilatation.   SPLEEN: Top normal size.   ??   ADRENALS: No adrenal nodules.   KIDNEYS/URETERS: Multiple punctate (1 to 2 mm) bilateral nonobstructing calculi.   Numerous bilateral simple cysts for which no follow-up is needed. No suspicious   masses. No hydronephrosis.   PELVIC ORGANS/BLADDER: A penile implant is noted. Enlarged prostate, measuring   3.9 x 5.8 x 4.7 cm. The bladder is unremarkable.   ??   PERITONEUM / RETROPERITONEUM: No free air or fluid.   LYMPH NODES: No enlarged mesenteric, retroperitoneal, or pelvic lymph nodes.   VESSELS: Advanced atheromatous changes. Ectasia of infrarenal abdominal aorta.   Mild distention of the portal veins.   ??   GI TRACT: Normal caliber with no obstruction. No wall thickening.   Diverticulosis. Normal appendix.   ??   BONES AND SOFT TISSUES: Advanced degenerative changes of the spine. Post   surgical changes at L4-5. Diffuse osseous demineralization. Mild bilateral hip   joint osteoarthritis. No significant soft tissue abnormality.   ??   IMPRESSION    IMPRESSION:    1.  Surgical changes from prior Whipple procedure. No evidence of locally   recurrent mass.   2.  Stable cysts and other liver hypodensities, too small to characterize.   3.  Nonobstructing bilateral nephrolithiasis. No hydronephrosis.   4.  Diverticulosis.           RUS 01/13/19   The right kidney measures 9.9 x 6.5 x 4.3 cm. The left kidney measures 11.2 x   7.4 x 4.1 cm. There is no hydronephrosis or nephrolithiasis. There are bilateral   renal cysts measuring up to 1.9 x 1.4 x 1.7 cm within the upper pole of the   right kidney and 1.7 x 1.5 x 1.7 cm within the lower pole of the left kidney.   The kidneys are echogenic bilaterally. There are bilateral renal cortical   calcifications.   ??   Bladder volume measures 54 cc. The bladder is suboptimally distended.   ??   Incidentally noted is splenomegaly and an echogenic liver.   ??   IMPRESSION:   ??   1.  Suboptimal exam. No hydronephrosis. Bilateral renal calcifications versus   nonobstructing calculi.   2.  Bilateral renal cysts.   3.  Splenomegaly.   4.  Hepatic steatosis     Results for orders placed or performed in visit on 11/17/19     AMB POC PVR, MEAS,POST-VOID RES,US,NON-IMAGING         Result  Value  Ref Range            PVR  0  cc       AMB POC URINALYSIS DIP STICK AUTO W/O MICRO         Result  Value  Ref Range            Color (UA POC)  Yellow         Clarity (UA POC)  Clear  Glucose (UA POC)  Negative  Negative       Bilirubin (UA POC)  Negative  Negative       Ketones (UA POC)  Negative  Negative       Specific gravity (UA POC)  1.020  1.001 - 1.035       Blood (UA POC)  Negative  Negative       pH (UA POC)  5.0  4.6 - 8.0       Protein (UA POC)  Negative  Negative       Urobilinogen (UA POC)  0.2 mg/dL  0.2 - 1       Nitrites (UA POC)  Negative  Negative            Leukocyte esterase (UA POC)  Negative  Negative            Margarito Liner PA-C    [facility] [place]   [place].    [place], [place]    [phone] (office)

## 2020-02-02 ENCOUNTER — Encounter: Attending: Urology | Primary: Family Medicine

## 2020-02-17 ENCOUNTER — Emergency Department: Admit: 2020-02-17 | Payer: MEDICARE | Primary: Family Medicine

## 2020-02-17 ENCOUNTER — Inpatient Hospital Stay
Admit: 2020-02-17 | Discharge: 2020-02-17 | Disposition: A | Discharge status: Home / Self Care | Payer: MEDICARE | Attending: Emergency Medicine

## 2020-02-17 DIAGNOSIS — K5792 Diverticulitis of intestine, part unspecified, without perforation or abscess without bleeding: Secondary | ICD-10-CM

## 2020-02-17 LAB — CBC WITH AUTOMATED DIFF
BASOPHILS: 0.1 % (ref 0–3)
EOSINOPHILS: 0.2 % (ref 0–5)
HCT: 39.9 % (ref 37.0–50.0)
HGB: 13.4 gm/dl (ref 12.4–17.2)
IMMATURE GRANULOCYTES: 0.5 % (ref 0.0–3.0)
LYMPHOCYTES: 7.3 % — ABNORMAL LOW (ref 28–48)
MCH: 30.1 pg (ref 23.0–34.6)
MCHC: 33.6 gm/dl (ref 30.0–36.0)
MCV: 89.7 fL (ref 80.0–98.0)
MONOCYTES: 7.3 % (ref 1–13)
MPV: 11.6 fL — ABNORMAL HIGH (ref 6.0–10.0)
NEUTROPHILS: 84.6 % — ABNORMAL HIGH (ref 34–64)
NRBC: 0 (ref 0–0)
PLATELET: 119 10*3/uL — ABNORMAL LOW (ref 140–450)
RBC: 4.45 M/uL (ref 3.80–5.70)
RDW-SD: 49.8 — ABNORMAL HIGH (ref 35.1–43.9)
WBC: 8.2 10*3/uL (ref 4.0–11.0)

## 2020-02-17 LAB — METABOLIC PANEL, COMPREHENSIVE
ALT (SGPT): 63 U/L (ref 12–78)
AST (SGOT): 43 U/L — ABNORMAL HIGH (ref 15–37)
Albumin: 3.1 gm/dl — ABNORMAL LOW (ref 3.4–5.0)
Alk. phosphatase: 160 U/L — ABNORMAL HIGH (ref 45–117)
Anion gap: 7 mmol/L (ref 5–15)
BUN: 12 mg/dl (ref 7–25)
Bilirubin, total: 1.1 mg/dl — ABNORMAL HIGH (ref 0.2–1.0)
CO2: 26 mEq/L (ref 21–32)
Calcium: 8.5 mg/dl (ref 8.5–10.1)
Chloride: 104 mEq/L (ref 98–107)
Creatinine: 1.2 mg/dl (ref 0.6–1.3)
GFR est AA: >60
GFR est non-AA: >60
Glucose: 137 mg/dl — ABNORMAL HIGH (ref 74–106)
Potassium: 3.5 mEq/L (ref 3.5–5.1)
Protein, total: 7.2 gm/dl (ref 6.4–8.2)
Sodium: 137 mEq/L (ref 136–145)

## 2020-02-17 LAB — LIPASE
Lipase: 14 U/L — ABNORMAL LOW (ref 73–393)
Lipase: 14 U/L — ABNORMAL LOW (ref 73–393)

## 2020-02-17 LAB — COMPREHENSIVE METABOLIC PANEL
ALT: 63 U/L (ref 12–78)
AST: 43 U/L — ABNORMAL HIGH (ref 15–37)
Albumin: 3.1 gm/dl — ABNORMAL LOW (ref 3.4–5.0)
Alkaline Phosphatase: 160 U/L — ABNORMAL HIGH (ref 45–117)
Anion Gap: 7 mmol/L (ref 5–15)
BUN: 12 mg/dl (ref 7–25)
CO2: 26 mEq/L (ref 21–32)
Calcium: 8.5 mg/dl (ref 8.5–10.1)
Chloride: 104 mEq/L (ref 98–107)
Creatinine: 1.2 mg/dl (ref 0.6–1.3)
EGFR IF NonAfrican American: >60
GFR African American: >60
Glucose: 137 mg/dl — ABNORMAL HIGH (ref 74–106)
Potassium: 3.5 mEq/L (ref 3.5–5.1)
Sodium: 137 mEq/L (ref 136–145)
Total Bilirubin: 1.1 mg/dl — ABNORMAL HIGH (ref 0.2–1.0)
Total Protein: 7.2 gm/dl (ref 6.4–8.2)

## 2020-02-17 LAB — CBC WITH AUTO DIFFERENTIAL
Basophils %: 0.1 % (ref 0–3)
Eosinophils %: 0.2 % (ref 0–5)
Hematocrit: 39.9 % (ref 37.0–50.0)
Hemoglobin: 13.4 gm/dl (ref 12.4–17.2)
Immature Granulocytes: 0.5 % (ref 0.0–3.0)
Lymphocytes %: 7.3 % — ABNORMAL LOW (ref 28–48)
MCH: 30.1 pg (ref 23.0–34.6)
MCHC: 33.6 gm/dl (ref 30.0–36.0)
MCV: 89.7 fL (ref 80.0–98.0)
MPV: 11.6 fL — ABNORMAL HIGH (ref 6.0–10.0)
Monocytes %: 7.3 % (ref 1–13)
Neutrophils %: 84.6 % — ABNORMAL HIGH (ref 34–64)
Nucleated RBCs: 0 (ref 0–0)
Platelets: 119 10*3/uL — ABNORMAL LOW (ref 140–450)
RBC: 4.45 M/uL (ref 3.80–5.70)
RDW-SD: 49.8 — ABNORMAL HIGH (ref 35.1–43.9)
WBC: 8.2 10*3/uL (ref 4.0–11.0)

## 2020-02-17 MED ORDER — IOPAMIDOL 61 % IV SOLN
61 % | Freq: Once | INTRAVENOUS | Status: AC
Start: 2020-02-17 — End: 2020-02-17
  Administered 2020-02-17: 09:00:00 via INTRAVENOUS

## 2020-02-17 MED ORDER — AMOXICILLIN CLAVULANATE 875 MG-125 MG TAB
875-125 mg | ORAL_TABLET | Freq: Two times a day (BID) | ORAL | 0 refills | Status: AC
Start: 2020-02-17 — End: 2020-02-27

## 2020-02-17 MED ORDER — ONDANSETRON 4 MG TAB, RAPID DISSOLVE
4 mg | ORAL_TABLET | Freq: Three times a day (TID) | ORAL | 0 refills | Status: AC | PRN
Start: 2020-02-17 — End: 2021-03-29

## 2020-02-17 MED ORDER — SODIUM CHLORIDE 0.9% BOLUS IV
0.9 % | INTRAVENOUS | Status: AC
Start: 2020-02-17 — End: 2020-02-17
  Administered 2020-02-17: 07:00:00 via INTRAVENOUS

## 2020-02-17 MED ORDER — ONDANSETRON (PF) 4 MG/2 ML INJECTION
4 mg/2 mL | Freq: Once | INTRAMUSCULAR | Status: AC
Start: 2020-02-17 — End: 2020-02-17
  Administered 2020-02-17: 07:00:00 via INTRAVENOUS

## 2020-02-17 MED ORDER — SODIUM CHLORIDE 0.9 % IJ SYRG
Freq: Once | INTRAMUSCULAR | Status: DC
Start: 2020-02-17 — End: 2020-02-17

## 2020-02-17 MED FILL — ONDANSETRON (PF) 4 MG/2 ML INJECTION: 4 mg/2 mL | INTRAMUSCULAR | Qty: 2

## 2020-02-17 MED FILL — ISOVUE-300  61 % INTRAVENOUS SOLUTION: 300 mg iodine /mL (61 %) | INTRAVENOUS | Qty: 80

## 2020-02-17 NOTE — ED Notes (Signed)
5:48 AM  02/17/20     Discharge instructions given to paienten (name) with verbalization of understanding. Patient accompanied by wife.  Patient discharged with the following prescriptions augmentin, zofran. Patient discharged to home (destination).      Douglass Rivers, RN

## 2020-02-17 NOTE — ED Provider Notes (Signed)
ED Provider Notes by Virgina Jock, PA-C at 02/17/20 0127                Author: Virgina Jock, PA-C  Service: EMERGENCY  Author Type: Physician Assistant       Filed: 02/17/20 0524  Date of Service: 02/17/20 0127  Status: Attested           Editor: Levy Sjogren (Physician Assistant)  Cosigner: Oneita Hurt, MD at 02/17/20 [phone]          Attestation signed by Oneita Hurt, MD at 02/17/20 [phone]          I interviewed and examined the patient. I discussed with the mid-level provider agree with her evaluation and plan as documented here.I have reviewed and agree  with all pertinent clinical information including history, physical exam, labs, radiographic studies and the plan.  I have also reviewed and agree with the medications, allergies and past medical history sections for this patient.      Abdomen soft on my examination, very minimal left lower quadrant tenderness, no guarding or rebound.                                 [place]   Emergency Department Treatment Report          Patient: Tommy Brown  Age: 74 y.o.  Sex: male          Date of Birth: 11-10-1945  Admit Date: 02/17/2020  PCP: Daralene Milch, MD     MRN: [id number]   CSN: [id number]   Attending: Oneita Hurt, MD         Room: ER30/ER30  Time Dictated: 1:27 AM  APP:  Virgina Jock, PA-C        Chief Complaint    Vomiting      History of Present Illness     74 y.o. male  brought in by medic from home complaining of nausea, vomiting and diarrhea since yesterday morning.  Patient has had little to eat or drink and is concerned about dehydration.  Denies hematemesis, melena or rectal bleeding.  Denies fever, chills or abdominal  pain.  Denies urinary symptoms.  Denies associated chest pain or shortness of breath.  He has a history of pancreatic cancer status post Whipple procedure 10 years ago and since has been in remission.  Denies recent ill contacts.        Review of Systems         Constitutional: No fever, chills   Eyes: No visual symptoms.   ENT: No sore throat, runny nose   Respiratory: No cough, dyspnea or wheezing.   Cardiovascular: No chest pain, palpitations,    Gastrointestinal: positive vomiting, diarrhea; no abdominal pain    Genitourinary: No dysuria or  frequency   Musculoskeletal: No lower extremity swelling   Integumentary: No rashes.   Neurological: No headaches   Denies complaints in all other systems.        Past Medical/Surgical History          Past Medical History:        Diagnosis  Date         ?  Arthritis       ?  Benign prostate hyperplasia       ?  Bladder infection       ?  Chronic kidney disease       ?  Coronary arteriosclerosis       ?  Coronary atherosclerosis of artery bypass graft       ?  Decreased testosterone level       ?  Diverticular disease       ?  Essential hypertension       ?  Gout       ?  History of acute renal failure       ?  History of anemia       ?  History of malignant neoplasm of pancreas       ?  Hx of CABG       ?  Kidney stone       ?  Kidney stones       ?  Neuropathy       ?  Pancreatic cancer ([place])       ?  Sepsis ([place])           ?  Tremor            Past Surgical History:         Procedure  Laterality  Date          ?  HX CERVICAL FUSION         ?  HX COLONOSCOPY         ?  HX CORONARY ARTERY BYPASS GRAFT    1995     ?  HX HEART CATHETERIZATION    2006     ?  HX ORTHOPAEDIC              back          ?  HX OTHER SURGICAL    2006          whipple procedure done for pancreatic cancer          ?  HX UROLOGICAL    2012          Percutaneous extraction of a kidney stone w/ fragmentation procedure              Social History          Social History          Socioeconomic History         ?  Marital status:  MARRIED       Tobacco Use         ?  Smoking status:  Never Smoker     ?  Smokeless tobacco:  Never Used       Vaping Use         ?  Vaping Use:  Never used       Substance and Sexual Activity         ?  Alcohol use:  Not Currently      ?  Drug use:  Not Currently     ?  Sexual activity:  Not Currently       Social History Narrative          ** Merged History Encounter **                        Family History          Family History         Problem  Relation  Age of Onset          ?  Alcohol abuse  Father       ?  Heart Disease  Brother            ?  Emphysema  Brother               Home Medications          Prior to Admission Medications     Prescriptions  Last Dose  Informant  Patient Reported?  Taking?      allopurinol (ZYLOPRIM) 300 mg tablet      Yes  No      Sig: Take  by mouth daily.      amLODIPine (NORVASC) 5 mg tablet      Yes  No      Sig: Take 5 mg by mouth daily.      aspirin 81 mg chewable tablet      Yes  No      Sig: Take 81 mg by mouth daily.      atorvastatin (LIPITOR) 20 mg tablet      Yes  No      Sig: TAKE 1 TABLET BY MOUTH EVERY DAY      bimatoprost (Lumigan) 0.01 % ophthalmic drops      Yes  No      Sig: Lumigan 0.01 % eye drops      diclofenac (VOLTAREN) 1 % gel      Yes  No      Sig: diclofenac 1 % topical gel    APPLY 2 GRAMS TOPICALLY TO THE AFFECTED AREAS 4 TIMES EACH DAY      erythromycin (ILOTYCIN) ophthalmic ointment      Yes  No      Sig: APPLY 1 A SMALL AMOUNT INTO RIGHT EYE THREE TIMES A DAY AS DIRECTED      gabapentin (NEURONTIN) 600 mg tablet      Yes  No      Sig: Take 600 mg by mouth two (2) times a day.      lactobacillus rhamnosus gg 10 billion cell (Culturelle) 10 billion cell capsule      No  No      Sig: Take 1 Cap by mouth Before breakfast and dinner.      lidocaine (XYLOCAINE) 5 % ointment      Yes  No      Sig: lidocaine 5 % topical ointment    USE AS DIRECTED.      lipase-protease-amylase (Creon) 24,000-76,000 -120,000 unit capsule      Yes  No      Sig: Take 2 Capsules by mouth four (4) times daily (with meals).      metoprolol succinate (TOPROL-XL) 25 mg XL tablet      Yes  No      Sig: TAKE 1 TABLET BY MOUTH EVERY DAY      naproxen (NAPROSYN) 500 mg tablet      Yes  No      Sig: TAKE 1 TABLET BY  MOUTH TWICE A DAY WITH FOOD      omeprazole (PRILOSEC) 40 mg capsule      No  No      Sig: Take 1 Capsule by mouth daily.      peg 400-propylene glycol (Systane, propylene glycol,) 0.4-0.3 % drop      Yes  No      Sig: Administer 2 Drops to both eyes daily. Patient places two drops in both eyes every AM.   Indications: dry eye      promethazine (PHENERGAN) 12.5 mg tablet      Yes  No      Sig: promethazine 12.5 mg tablet  TAKE 1 TABLET BY MOUTH 2 TIMES PER DAY AS NEEDED      Patient not taking: Reported on 09/08/2019      tamsulosin (Flomax) 0.4 mg capsule      No  No      Sig: Take 2 Caps by mouth daily.      temazepam (RESTORIL) 7.5 mg capsule      Yes  No      Sig: Take  by mouth nightly.      testosterone (ANDROGEL) 20.25 mg/1.25 gram (1.62 %) gel      No  No      Sig: Apply 20 mg to affected area daily. Max Daily Amount: 20 mg.      Patient not taking: Reported on 09/08/2019      testosterone cypionate (DEPOTESTOTERONE CYPIONATE) 200 mg/mL injection      Yes  No      Sig: INJECT 0.5 MILLILITER INTO THE MUSCLE EVERY 2 WEEKS DISCARD VIAL AFTER USE      tiZANidine (ZANAFLEX) 4 mg tablet      Yes  No      Sig: tizanidine 4 mg tablet    TAKE 1 TABLET BY MOUTH EACH DAY AT BEDTIME AS NEEDED      Patient not taking: Reported on 09/08/2019      triamcinolone acetonide (KENALOG) 0.1 % topical cream      Yes  No      Sig: triamcinolone acetonide 0.1 % topical cream               Facility-Administered Medications: None             Allergies          Allergies        Allergen  Reactions         ?  Other Food  Other (comments)             VISTAID   ANESTHESIA         ?  Codeine  Itching and Swelling     ?  Oxycodone  Anaphylaxis     ?  Hydroxyzine Pamoate  Itching and Other (comments)             Other reaction(s): Unknown            ?  Meperidine  Itching, Palpitations, Swelling and Other (comments)             Other reaction(s): Unknown            ?  Metoclopramide  Itching, Other (comments) and Swelling             Other  reaction(s): Other (See Comments)   Reaction unknown    Reaction unknown             ?  Morphine  Other (comments) and Nausea and Vomiting     ?  Oxycodone-Acetaminophen  Itching     ?  Prochlorperazine  Itching, Other (comments) and Unknown (comments)             Other reaction(s): Unknown   Unsure of reaction            ?  Hydroxyzine Hcl  Hives             Physical Exam          ED Triage Vitals [02/17/20 0114]     ED Encounter Vitals Group           BP  133/66        Pulse  74        Resp  17        Temp  99 ??F (37.2 ??C)        Temp src          SpO2  97% RA        Weight             Height             Constitutional: Patient appears well developed and well nourished. Appearance and behavior are age and situation appropriate.   HEENT: Conjunctiva clear. PERRLA. Mucous membranes moist, non-erythematous. Surface of the pharynx, palate, and tongue are pink, moist and  without lesions.   Neck: supple, non tender, symmetrical, no masses or JVD. No lymphadenopathy.   Respiratory: lungs clear to auscultation, nonlabored respirations. No tachypnea or accessory muscle use.   Cardiovascular: heart regular rate and rhythm without murmur rubs or gallops.     Gastrointestinal:  Abdomen soft, nondistended, tenderness to palpation in the left lower quadrant, no rebound or guarding, rest of the abdomen  is nontender to palpation, no CVA tenderness noted bilaterally   Musculoskeletal: Calves soft and non-tender. Distal pulses 2+ and equal bilaterally.  No peripheral edema   Integumentary: warm and dry without rashes or lesions   Neurologic: alert and oriented. No facial asymmetry or dysarthria. Moving all extremities well        Impression and Management Plan     74 y.o. male  with a history of hypertension, hyperlipidemia, coronary artery disease status post CABG, pancreatic cancer-in remission presents complaining of nausea, vomiting and diarrhea x2 days.  Patient is afebrile and overall well-appearing, nontoxic.   His vital  signs are stable.  On physical exam he has left lower quadrant abdominal tenderness, rest of his exam is unremarkable.  We will obtain appropriate labs, start IV fluid hydration and medicate symptomatically with IV Zofran.        Diagnostic Studies     Lab:      Recent Results (from the past 12 hour(s))     CBC WITH AUTOMATED DIFF          Collection Time: 02/17/20  1:40 AM         Result  Value  Ref Range            WBC  8.2  4.0 - 11.0 1000/mm3       RBC  4.45  3.80 - 5.70 M/uL       HGB  13.4  12.4 - 17.2 gm/dl       HCT  39.9  37.0 - 50.0 %       MCV  89.7  80.0 - 98.0 fL       MCH  30.1  23.0 - 34.6 pg       MCHC  33.6  30.0 - 36.0 gm/dl       PLATELET  119 (L)  140 - 450 1000/mm3       MPV  11.6 (H)  6.0 - 10.0 fL       RDW-SD  49.8 (H)  35.1 - 43.9         NRBC  0  0 - 0         IMMATURE GRANULOCYTES  0.5  0.0 - 3.0 %       NEUTROPHILS  84.6 (H)  34 - 64 %  LYMPHOCYTES  7.3 (L)  28 - 48 %       MONOCYTES  7.3  1 - 13 %       EOSINOPHILS  0.2  0 - 5 %       BASOPHILS  0.1  0 - 3 %       METABOLIC PANEL, COMPREHENSIVE          Collection Time: 02/17/20  1:40 AM         Result  Value  Ref Range            Sodium  137  136 - 145 mEq/L       Potassium  3.5  3.5 - 5.1 mEq/L       Chloride  104  98 - 107 mEq/L       CO2  26  21 - 32 mEq/L       Glucose  137 (H)  74 - 106 mg/dl       BUN  12  7 - 25 mg/dl       Creatinine  1.2  0.6 - 1.3 mg/dl       GFR est AA  >60          GFR est non-AA  >60          Calcium  8.5  8.5 - 10.1 mg/dl       AST (SGOT)  43 (H)  15 - 37 U/L       ALT (SGPT)  63  12 - 78 U/L       Alk. phosphatase  160 (H)  45 - 117 U/L       Bilirubin, total  1.1 (H)  0.2 - 1.0 mg/dl       Protein, total  7.2  6.4 - 8.2 gm/dl       Albumin  3.1 (L)  3.4 - 5.0 gm/dl       Anion gap  7  5 - 15 mmol/L       LIPASE          Collection Time: 02/17/20  1:40 AM         Result  Value  Ref Range            Lipase  14 (L)  73 - 393 U/L           Imaging:     CT ABD PELV W CONT      Result Date: 02/17/2020    IMPRESSION: 1. Mild acute diverticulitis left lower quadrant. 2. Status post Whipple's procedure, postoperative changes right upper quadrant not significantly changed from prior study. 3. Fatty infiltration of the liver. 4. Hepatic cysts, subcentimeter  hypodensities too small to characterize. 5. Complex bilateral renal cysts, too numerous to characterize. 6. Nonobstructing 2 to 3 mm left nephrolithiasis. 7. Hiatal hernia.            ED Course/Medical Decision Making     Patient remained stable throughout his stay in the ED with no new or worsening symptoms.  I have reviewed the results with the patient.  CBC reveals thrombocytopenia  otherwise unremarkable.  Chemistry reveals mild hyperglycemia and mild elevation of liver function tests.  Lipase negative.  CT the abdomen and pelvis with IV contrast shows mild acute diverticulitis of the left lower quadrant and postoperative changes  status post Whipple procedure and complex bilateral renal cysts.  On reexam patient is resting comfortably.  He was given IV fluid hydration  and medicated with IV Zofran with significant improvement of his symptoms.  He is tolerating oral fluids without  difficulty.  On repeat exam he still has some very mild tenderness in the left lower abdomen, rest of his abdomen is nontender to palpation.  I feel the patient is stable for discharge home and outpatient treatment.  We will discharge home with Augmentin  and antiemetics.  Patient advised to follow-up closely with his primary care physician.  Patient advised to return to the ED for new or worsening symptoms.     Medications       sodium chloride (NS) flush 5-10 mL (has no administration in time range)     sodium chloride 0.9 % bolus infusion 500 mL (0 mL IntraVENous IV Completed 02/17/20 0330)     ondansetron (ZOFRAN) injection 4 mg (4 mg IntraVENous Given 02/17/20 0145)       iopamidoL (ISOVUE 300) 61 % contrast injection 80 mL (80 mL IntraVENous Given 02/17/20 0334)          Final  Diagnosis                  ICD-10-CM  ICD-9-CM          1.  Non-intractable vomiting with nausea, unspecified vomiting type   R11.2  787.01     2.  Diarrhea, unspecified type   R19.7  787.91     3.  Dehydration   E86.0  276.51          4.  Diverticulitis   K57.92  562.11             Disposition     Patient stable for discharge           Current Discharge Medication List              START taking these medications          Details        amoxicillin-clavulanate (Augmentin) 875-125 mg per tablet  Take 1 Tablet by mouth two (2) times a day for 10 days.   Qty: 20 Tablet, Refills:  0   Start date: 02/17/2020, End date:  02/27/2020               ondansetron (ZOFRAN ODT) 4 mg disintegrating tablet  Take 1 Tablet by mouth every eight (8) hours as needed for Nausea.   Qty: 20 Tablet, Refills:  0   Start date: 02/17/2020                      The patient was fully discussed with [facility], Orlando Penner, MD who agrees with the above assessment and plan         Ronette Deter PA-C   February 17, 2020      My signature above authenticates this document and my orders, the final ??   diagnosis (es), discharge prescription (s), and instructions in the Epic ??   record.   If you have any questions please contact [phone].   ??   Nursing notes have been reviewed by the physician/ advanced practice ??   Clinician.

## 2020-02-17 NOTE — ED Notes (Signed)
Pt arrived from home [facility] c/o vomiting since yesterday at 10 am. Pt has history of pancreatic CA.

## 2020-02-17 NOTE — ED Notes (Signed)
Spoke to Ms [name], pt's wife. Pt for dsichagre. She is gonna be here in 104minutes to pick up the patient

## 2020-04-03 ENCOUNTER — Ambulatory Visit: Attending: Urology | Primary: Family Medicine

## 2020-04-03 ENCOUNTER — Encounter: Attending: Urology | Primary: Family Medicine

## 2020-04-03 ENCOUNTER — Ambulatory Visit: Admit: 2020-04-03 | Discharge: 2020-04-03 | Attending: Urology | Primary: Family Medicine

## 2020-04-03 DIAGNOSIS — N200 Calculus of kidney: Secondary | ICD-10-CM

## 2020-04-03 LAB — AMB POC URINALYSIS DIP STICK AUTO W/O MICRO
Bilirubin (UA POC): NEGATIVE
Bilirubin, Urine, POC: NEGATIVE
Blood (UA POC): NEGATIVE
Blood (UA POC): NEGATIVE
Glucose (UA POC): NEGATIVE
Glucose, Urine, POC: NEGATIVE
Ketones (UA POC): NEGATIVE
Ketones, Urine, POC: NEGATIVE
Leukocyte Esterase, Urine, POC: NEGATIVE
Leukocyte esterase (UA POC): NEGATIVE
Nitrite, Urine, POC: NEGATIVE
Nitrites (UA POC): NEGATIVE
Protein (UA POC): NEGATIVE
Protein, Urine, POC: NEGATIVE
Specific Gravity, Urine, POC: 1.02 NA (ref 1.001–1.035)
Specific gravity (UA POC): 1.02 (ref 1.001–1.035)
Urobilinogen (UA POC): 0.2 (ref 0.2–1)
Urobilinogen, POC: 0.2 (ref 0.2–1)
pH (UA POC): 5.5 (ref 4.6–8.0)
pH, Urine, POC: 5.5 NA (ref 4.6–8.0)

## 2020-04-03 LAB — AMB POC PVR, MEAS,POST-VOID RES,US,NON-IMAGING
PVR POC: 0 cc
PVR: 0 cc

## 2020-04-03 NOTE — Progress Notes (Signed)
Progress Notes by Sandi Raveling, MD at 04/03/20 1330                Author: Sandi Raveling, MD  Service: --  Author Type: Physician       Filed: 04/03/20 1708  Encounter Date: 04/03/2020  Status: Signed          Editor: Sandi Raveling, MD (Physician)               Tommy Brown   [id number]   Encounter date: 04/03/2020              Follow-up Visit        Encounter Diagnoses              ICD-10-CM  ICD-9-CM          1.  Kidney stone   N20.0  592.0               ASSESSMENT:    1. nephrolithiasis                Surgical History:                S/p URS with LL in 02/2017, for a left 60mm proximal ureteral stone with hydroureteronephrosis??and 48mm LLP stone               S/p left jj stent on 06/22/19 with Dr. Tresa Endo                S/p Cystoscopy, left retrograde pyelogram, left ureteroscopy, holmium laser lithotripsy of proximal ureteral stone and extraction of left renal calculi and left double-J ureteral stent exchange on 07/31/18, string with stent removed  on 08/06/18.    ??               Medical Therapy:  n/a               Stone Composition: calcium oxalate 100% 07/31/18.                24 Hr Urine Panel: [facility] 09/27/19: Very low urine volume(1.92), very low urinary citrate (99), very elevated urinary  sodium (324), mildly low urinary pH (5.65).    ??           Serum calcium 07/21/2019 8.7, potassium 4.2.                 Imaging:                RUS 01/13/19: Suboptimal exam. No hydronephrosis. Bilateral renal calcifications versus   nonobstructing calculi.                CT A/P 06/22/18: Multiple small (2 to 4 mm) nonobstructing bilateral renal calculi. There is a 10 x 9 mm nonobstructing   calculus in the lower pole of the left kidney. Moderate left hydronephrosis and hydroureter. There is a 8 x 7 mm obstructing calculus in the proximal left ureter. No right hydronephrosis. No suspicious renal lesions.                R [place] 09/08/2019: Right kidney 9 mm, 6 mm left kidney.  No evidence of hydronephrosis.   Uncomplicated cyst.               CT A/P w/ contrast 10/28/19: KIDNEYS/URETERS: Multiple punctate (1 to 2 mm) bilateral nonobstructing calculi. Numerous bilateral simple cysts for which no follow-up is needed. No suspicious   masses. No hydronephrosis.  CT Abd Pelv 02/17/20: Nonobstructing 2 to 3 mm left nephrolithiasis.         2. Hypogonadism, Previously managed by Dr.  Cleatis Polka, MD                Last Testosterone level was 567 ng/dL on 7/77/83.                CBC 03/25/19 reviewed with patient.                On TC 0.5 mL injections q 2 weeks as managed by PCP.       3. Prostate Cancer Screening                Most recent PSA 04/30/18: 0.6 ng/mL                DRE today 04/03/20: 20 grams, benign    ??   Hx of Pancreatic cancer s/p Whipple in 2006, chemotherapy and radiation??      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              PLAN:    Discussed kidney stone prevention   Start on Litholyte- info provided    Continue to push fluids for urine output >2-3 liters per day.  Observe low oxalate/Na diet.   Recommend repeat 24 hr urine and BMP in 8 weeks. Order for BMP provided.         Follow-up and Dispositions      ??  Return for  F/u in 4 months for KUB on arrival, Reassess Symptoms.                  Chief Complaint       Patient presents with        ?  Kidney Stone        ?  Benign Prostatic Hypertrophy            HPI: Tommy Brown  is a 75 y.o. WHITE/NON-HISPANIC  male  who presents today in follow up for an established diagnosis of kidney stone. Pt is most recently S/p Cystoscopy, left retrograde pyelogram,  left ureteroscopy, holmium laser lithotripsy of proximal ureteral stone and extraction of left renal calculi and left double-J ureteral stent exchange on 07/31/18.   At visit with me on 09/08/19, pt noted that he passed a 6-7 mm stone when he voided right before his visit with me.     Pt underwent a CT A/P w/ contrast on 10/28/19 for pancreatic cancer follow-up imaging, h/o of whipple procedure showed  bilateral  Punctate stones. Patient was last  seen 11/17/19 by Jearl Klinefelter PA-C. Denied interval stone event.      Pt reports recent diverticulitis in 01/2020. He was seen the ER and treated for this.    He is doing well today.    No recent renal colic.    No current flank or abdominal pain.    Notes he is not taking any medication for kidney stone prevention.    No gross hematuria or dysuria reported.    No f/c/n/v.          Prior History:    Previously on TC injections q 2 weeks via his PCP. He was switched to Androgel but his insurance would not cover the medication. Currently injecting 0.5 mL TC q 2 weeks. Overall happy with this management.        S/p IPP placement. Not functional. Not sexually  active at this time.    ??   ??   H/o pancreatic cancer, h/o of gout, on Allopurinol daily.          LABS / IMAGING:     Lab Results         Component  Value  Date/Time            Prostate Specific Ag  0.6  04/30/2018 12:00 AM            Prostate Specific Ag  0.5  08/25/2013 12:00 AM        CT Abd Pelv W Cont 02/17/20   IMPRESSION:   1. Mild acute diverticulitis left lower quadrant.   2. Status post Whipple's procedure, postoperative changes right upper quadrant   not significantly changed from prior study.   3. Fatty infiltration of the liver.   4. Hepatic cysts, subcentimeter hypodensities too small to characterize.   5. Complex bilateral renal cysts, too numerous to characterize.   6. Nonobstructing 2 to 3 mm left nephrolithiasis.   7. Hiatal hernia.         AUA Assessment Score:    ;     AUA Bother Rating:          Current Outpatient Medications          Medication  Sig  Dispense  Refill           ?  ondansetron (ZOFRAN ODT) 4 mg disintegrating tablet  Take 1 Tablet by mouth every eight (8) hours as needed for Nausea.  20 Tablet  0     ?  atorvastatin (LIPITOR) 20 mg tablet  TAKE 1 TABLET BY MOUTH EVERY DAY         ?  diclofenac (VOLTAREN) 1 % gel  diclofenac 1 % topical gel    APPLY 2 GRAMS TOPICALLY TO THE AFFECTED  AREAS 4 TIMES EACH DAY         ?  naproxen (NAPROSYN) 500 mg tablet  TAKE 1 TABLET BY MOUTH TWICE A DAY WITH FOOD         ?  triamcinolone acetonide (KENALOG) 0.1 % topical cream  triamcinolone acetonide 0.1 % topical cream         ?  bimatoprost (Lumigan) 0.01 % ophthalmic drops  Lumigan 0.01 % eye drops         ?  testosterone cypionate (DEPOTESTOTERONE CYPIONATE) 200 mg/mL injection  INJECT 0.5 MILLILITER INTO THE MUSCLE EVERY 2 WEEKS DISCARD VIAL AFTER USE         ?  metoprolol succinate (TOPROL-XL) 25 mg XL tablet  TAKE 1 TABLET BY MOUTH EVERY DAY         ?  lipase-protease-amylase (Creon) 24,000-76,000 -120,000 unit capsule  Take 2 Capsules by mouth four (4) times daily (with meals).         ?  temazepam (RESTORIL) 7.5 mg capsule  Take  by mouth nightly.         ?  tamsulosin (Flomax) 0.4 mg capsule  Take 2 Caps by mouth daily.  60 Cap  1     ?  peg 400-propylene glycol (Systane, propylene glycol,) 0.4-0.3 % drop  Administer 2 Drops to both eyes daily. Patient places two drops in both eyes every AM.   Indications: dry eye         ?  gabapentin (NEURONTIN) 600 mg tablet  Take 600 mg by mouth two (2) times a day.         ?  amLODIPine (NORVASC) 5 mg tablet  Take 5 mg by mouth daily.         ?  allopurinol (ZYLOPRIM) 300 mg tablet  Take  by mouth daily.         ?  aspirin 81 mg chewable tablet  Take 81 mg by mouth daily.         ?  tiZANidine (ZANAFLEX) 4 mg tablet  tizanidine 4 mg tablet    TAKE 1 TABLET BY MOUTH EACH DAY AT BEDTIME AS NEEDED (Patient not taking: Reported on 09/08/2019)         ?  promethazine (PHENERGAN) 12.5 mg tablet  promethazine 12.5 mg tablet    TAKE 1 TABLET BY MOUTH 2 TIMES PER DAY AS NEEDED (Patient not taking: Reported on 09/08/2019)               ?  lactobacillus rhamnosus gg 10 billion cell (Culturelle) 10 billion cell capsule  Take 1 Cap by mouth Before breakfast and dinner.  40 Cap  0           All:     Allergies        Allergen  Reactions         ?  Other Food  Other (comments)              VISTAID   ANESTHESIA         ?  Codeine  Itching and Swelling     ?  Oxycodone  Anaphylaxis     ?  Hydroxyzine Pamoate  Itching and Other (comments)             Other reaction(s): Unknown            ?  Meperidine  Itching, Palpitations, Swelling and Other (comments)             Other reaction(s): Unknown            ?  Metoclopramide  Itching, Other (comments) and Swelling             Other reaction(s): Other (See Comments)   Reaction unknown    Reaction unknown             ?  Morphine  Other (comments) and Nausea and Vomiting     ?  Oxycodone-Acetaminophen  Itching     ?  Prochlorperazine  Itching, Other (comments) and Unknown (comments)             Other reaction(s): Unknown   Unsure of reaction            ?  Hydroxyzine Hcl  Hives             Past Medical History:        Diagnosis  Date         ?  Arthritis       ?  Benign prostate hyperplasia       ?  Bladder infection       ?  Chronic kidney disease       ?  Coronary arteriosclerosis       ?  Coronary atherosclerosis of artery bypass graft       ?  Decreased testosterone level       ?  Diverticular disease       ?  Essential hypertension       ?  Gout       ?  History of acute renal failure       ?  History of anemia       ?  History of malignant neoplasm of pancreas       ?  Hx of CABG       ?  Kidney stone       ?  Kidney stones       ?  Neuropathy       ?  Pancreatic cancer ([place])       ?  Sepsis ([place])           ?  Tremor               Past Surgical History:         Procedure  Laterality  Date          ?  HX CERVICAL FUSION         ?  HX COLONOSCOPY         ?  HX CORONARY ARTERY BYPASS GRAFT    1995     ?  HX HEART CATHETERIZATION    2006     ?  HX ORTHOPAEDIC              back          ?  HX OTHER SURGICAL    2006          whipple procedure done for pancreatic cancer          ?  HX UROLOGICAL    2012          Percutaneous extraction of a kidney stone w/ fragmentation procedure            Review of Systems   Constitutional: Fever: No   Skin: Rash: No    HEENT: Hearing difficulty: No   Eyes: Blurred vision: No   Cardiovascular: Chest pain: No   Respiratory: Shortness of breath: No   Gastrointestinal: Nausea/vomiting: No   Musculoskeletal: Back pain: No   Neurological: Weakness: No   Psychological: Memory loss: No   Comments/additional findings:       Any elements of the PMFSHx, ROS, or preliminary elements of the HPI that were entered by a medical assistant have been reviewed in full.      PHYSICAL EXAM:     Visit Vitals      Resp  16     Ht  5' 7.01" (1.702 m)     Wt  131 lb (59.4 kg)        BMI  20.51 kg/m??        Constitutional: WDWN, Pleasant and appropriate affect, No acute distress.     CV:  No peripheral swelling noted   Respiratory: No respiratory distress or difficulties   Abdomen:  No abdominal masses or tenderness.  No CVA tenderness. No hernias noted.    GU Male: [birth year]    DRE: Perineum normal to visual inspection, no erythema or irritation, Sphincter with good tone, Rectum with no hemorrhoids,  fissures or masses.  Prostate is 20 grams, smooth, anodular   SCROTUM:  No scrotal rash or lesions noticed.  Normal bilateral testes and epididymis.    PENIS: Urethral meatus normal in location and size. No urethral discharge.   Skin: No jaundice.     Neuro/Psych:  Alert and oriented x 3. Affect appropriate.          Body mass index is 20.51 kg/m??. Patient's BMI is out of the normal parameters.  Information about BMI was given and patient was advised to  follow-up with  their PCP for further management.       Labs Today:     Results for orders placed or performed in visit on 04/03/20     AMB POC PVR, MEAS,POST-VOID RES,US,NON-IMAGING         Result  Value  Ref Range            PVR  0  cc       AMB POC URINALYSIS DIP STICK AUTO W/O MICRO         Result  Value  Ref Range            Color (UA POC)  Yellow         Clarity (UA POC)  Clear         Glucose (UA POC)  Negative  Negative       Bilirubin (UA POC)  Negative  Negative       Ketones (UA POC)  Negative  Negative        Specific gravity (UA POC)  1.020  1.001 - 1.035       Blood (UA POC)  Negative  Negative       pH (UA POC)  5.5  4.6 - 8.0       Protein (UA POC)  Negative  Negative       Urobilinogen (UA POC)  0.2 mg/dL  0.2 - 1       Nitrites (UA POC)  Negative  Negative            Leukocyte esterase (UA POC)  Negative  Negative            Retia Passe, M.D., F.A.C.S.      Documentation provided with the assistance of Fortino Sic, medical scribe for Retia Passe, M.D., F.A.C.S.

## 2020-07-06 NOTE — Interval H&P Note (Signed)
 PREOPERATIVE INSTRUCTIONS  Please Read Carefully    [x]  Your procedure is scheduled on: 07/10/2020  [x]  The day before your surgery, call the surgeon's office to check on the surgery time and the time you should arrive.     [x]  On the day of your surgery, arrive at the time given to you by your surgeon and check in at the first floor registration desk.    [x]  DO NOT eat or drink anything after midnight before your surgery. This includes gum, mints or hard candy.    [x]  You may brush your teeth the morning of surgery, however DO NOT swallow any water .    []  No smoking after midnight.  Smoking should be reduced a few days prior to surgery.    [x]  If you are having an outpatient procedure, you must have a responsible adult bring you to the hospital.  They must remain in the surgical waiting area for the duration of your procedure and provide you with transportation home.  You may not drive yourself home.  We also recommend that you arrange for a responsible person to stay with you for 24 hours following your procedure.  Failure to comply with these instructions may result in cancellation of the procedure.    []  A parent or legal guardian must accompany minors to the hospital.  LEGAL GUARDIANS MUST bring custody papers with them the day of surgery.    []  If the lab gives you a blue blood ID band, do not throw it away.  Bring it with you on the day of surgery.      []  Please return to preop surgical testing no more than 14 days before surgery date to have your type and screen done prior to surgery.    []  If you have been diagnosed with Sleep Apnea and are using a CPAP, BiPAP, and/or Bi-Flex machine, please bring it with you the day of surgery.    []  Endoscopy patients, please follow the instructions provided by the doctors office.    []  If crutches are ordered, you must get them and be instructed on proper use before the day of surgery.  Please bring them with you the day of surgery.      PREPARING THE SKIN  BEFORE SURGERY Can Reduce the Risk of Infection  []  You have been given a Chlorhexidine skin preparation packet with instructions to bathe the night before surgery and the morning of surgery prior to arrival.    []  If allergic to Chlorhexidine, use Dial (antibacterial) soap to bathe your skin the night before surgery and the morning of surgery.    []  Do not shave your face, underarms, legs or any part of your body at least 48 hours before surgery.  Any clipping needed for surgery will be done at the hospital.      PREPARING TO COME TO THE HOSPITAL  [x]  Wear casual, loose fitting comfortable clothes the day of surgery.    [x]  Remove ALL jewelry, including body piercings.  Leave these and all valuables at home.  Leave suitcases and/or overnight bag at home or in the car for a family member to bring when you get a room.    [x]  DO NOT wear any makeup, nail polish, deodorant, body lotion, aftershave or contact lenses the day of surgery.  Please bring your glasses and glasses case with you the day of surgery.    [x]  Bring any additional paperwork, such as doctors orders, consent form, POA (power of  attorney), and/or advanced directives with you the day of surgery.    [x]  Please notify your surgeon of any changes in your condition such as fever, sore throat or rash as soon as possible before your surgery date.  After office hours, call your surgeon's answering service.    [x] Bring picture identification and insurance cards with you the day of surgery.      MEDICATIONS:  [x]  Stop taking aspirin  and aspirin  products/NSAIDs (non-steroidal anti-inflammatory drugs such as Motrin, Aleve, Advil, ibuprofen and BC Powder), vitamins and herbal pills 2 weeks before surgery OR as instructed by your doctor.  However, if you take a daily aspirin , please contact your primary care provider (PCP), for instructions prior to surgery.    []  Contact your doctor regarding any blood thinner medications you take (such as Coumadin, Plavix, etc.)  for instructions about what to do prior to surgery.    [] If you have diabetes, hold oral diabetic medications the night before surgery and the morning of surgery.  Hold insulin the morning of surgery unless told otherwise by your doctor.    []  If you have asthma or use inhalers please bring them the day of surgery.    [x] Take the following medicine with a sip of water  the day of surgery:  PT. STATED THAT HE PREFERRED TO TAKE HIS MORNING MEDICATIONS ONCE HE GETS HOME.  ADVISED PATIENT TO AT LEAST TAKE METOPROLOL  PRIOR TO COMING IN TO THE PROCEDURE.      Medication Documentation Review Audit     Reviewed by Sharman Sharlet Jacob, RN (Registered Nurse) on 07/06/20 at 1357    Medication Sig Documenting Provider Last Dose Status Taking?   allopurinol  (ZYLOPRIM ) 300 mg tablet Take  by mouth daily. Other, Phys, MD  Active    amLODIPine  (NORVASC ) 5 mg tablet Take 5 mg by mouth daily. Other, Phys, MD  Active    aspirin  81 mg chewable tablet Take 81 mg by mouth daily. Other, Phys, MD  Active    atorvastatin (LIPITOR) 20 mg tablet TAKE 1 TABLET BY MOUTH EVERY DAY Provider, Historical  Active    bimatoprost (Lumigan) 0.01 % ophthalmic drops Administer 1 Drop to both eyes nightly. Provider, Historical  Active    furosemide (LASIX) 20 mg tablet daily. Provider, Historical  Active    gabapentin  (NEURONTIN ) 600 mg tablet Take 600 mg by mouth two (2) times a day. Other, Phys, MD  Active    lactobacillus rhamnosus gg 10 billion cell (Culturelle) 10 billion cell capsule Take 1 Cap by mouth Before breakfast and dinner. Lorriane Oliva BRAVO, MD  Active    lipase -protease -amylase  (Creon ) 24,000-76,000 -120,000 unit capsule Take 2 Capsules by mouth four (4) times daily (with meals). Provider, Historical  Active    metoprolol  succinate (TOPROL -XL) 25 mg XL tablet TAKE 1 TABLET BY MOUTH EVERY DAY Provider, Historical  Active    naproxen (NAPROSYN) 500 mg tablet TAKE 1 TABLET BY MOUTH TWICE A DAY WITH FOOD Provider, Historical  Active    ondansetron   (ZOFRAN  ODT) 4 mg disintegrating tablet Take 1 Tablet by mouth every eight (8) hours as needed for Nausea. Wanna Rosina NOVAK, PA-C  Active    peg 400-propylene glycol (Systane, propylene glycol,) 0.4-0.3 % drop Administer 2 Drops to both eyes daily. Patient places two drops in both eyes every AM.   Indications: dry eye Provider, Historical  Active    promethazine  (PHENERGAN ) 12.5 mg tablet promethazine  12.5 mg tablet   TAKE 1 TABLET BY MOUTH 2 TIMES PER  DAY AS NEEDED   Patient not taking: Reported on 09/08/2019    Provider, Historical  Active    tamsulosin  (Flomax ) 0.4 mg capsule Take 2 Caps by mouth daily. Lorriane Oliva BRAVO, MD  Active    temazepam  (RESTORIL ) 7.5 mg capsule Take  by mouth nightly. Provider, Historical  Active    testosterone cypionate (DEPOTESTOTERONE CYPIONATE) 200 mg/mL injection INJECT 0.5 MILLILITER INTO THE MUSCLE EVERY 2 WEEKS DISCARD VIAL AFTER USE Provider, Historical  Active    tiZANidine (ZANAFLEX) 4 mg tablet tizanidine 4 mg tablet   TAKE 1 TABLET BY MOUTH EACH DAY AT BEDTIME AS NEEDED   Patient not taking: Reported on 09/08/2019    Provider, Historical  Active    triamcinolone acetonide (KENALOG) 0.1 % topical cream triamcinolone acetonide 0.1 % topical cream Provider, Historical  Active                  *Visitor Policy-One visitor per patient, must be 51 years of age or older, must wear a mask. Upon check in, the patient must arrive with their post procedure transportation home. The post-procedure transporting party must remain within the surgical waiting room for the duration of the procedure. Failure may result in the cancellation of the procedure*      We want you to have a positive experience at [facility].  If any of these these instructions are not met, it is possible that your surgery will be canceled.  If you have any questions regarding your surgery, please call [facility] at [phone] or your surgeon's office for further assistance.       [facility]   [facility]    PSAT Anesthesia     There are many ways to perform anesthesia for surgery.  The 2 techniques are generally classified as General Anesthesia and Regional Anesthesia.  While both techniques are very safe, they are distinctly different.  As with any anesthesia, there are risks, which may be increased if you already have heart disease, chronic lung conditions, or other serious medical problems.    General anesthesia puts you to sleep for your surgery.  It acts on your brain and nerves, and affects your entire body.  It may be administered by an injection, or through inhaling medication.  After you are asleep, a breathing tube may be placed in your windpipe to help you breathe during surgery.  General anesthesia may be more appropriate for longer or more involved surgery, especially if the position you will be in surgery is uncomfortable.  With general anesthesia you may experience a sore throat and hoarse voice for a few days, headache, nausea/vomiting, drowsiness, or blood pressure and breathing problems.    Regional anesthesia involves blocking the nerves to a specific area of the body with local anesthesia medication (numbing medicine).  It is usually given in conjunction with varying degrees of twilight sedation.  When at all possible, the recommended type of anesthesia for both total knee replacement (TKA) and total hip replacement (THA) is a regional anesthesia technique.  This can be achieved utilizing a spinal block technique, or third placement of an epidural catheter.  In addition, your anesthesiologist may recommend that you have a peripheral nerve block placed to help with pain after the procedure.    -Spinal block-In a spinal block, local anesthetic (i.e. numbing medicine) is injected into the fluid that baths the spinal cord in the lower part of your back.  This produces a rapid numbing effect that wears  off in a few hours.  After meeting the anesthesiologist and discussing her  medical conditions and history, the anesthesiologist may decide to place a long-acting pain medicine as well.  There are some medical conditions and home medications that may preclude a spinal block.    -Epidural block-An epidural block uses a catheter inserted into your lower back to deliver numbing medicine.  The epidural block in the spinal block are administered in a very similar location and technique; however, the epidural catheter is placed in a slightly different area around the spine as compared to a spinal block.    -Peripheral nerve block (PNB)-For TKA, the anesthesiologist may discuss performing a PNB.  This technique places local anesthetic directly around the major nerves in your thigh, and one or multiple locations.  These blocks numb only the leg that is injected, and do not affect the other leg.  PNB's are used in addition to another anesthesia technique, and are for postoperative pain relief only.    The advantages of regional anesthesia for joint replacement surgery are considered to be significant.  There is a significant reduction in blood loss and blood transfusions, less nausea/vomiting, less drowsiness, improved pain control after surgery, better diabetes control, and less association with infection.  Additionally, there also are reduced risks (as compared to general anesthesia techniques) with serious medical complications such as heart attack, stroke, pneumonia, respiratory depression, or development of a blood clot in your legs that can go to your lungs.  Like with any technique, there are risks.  With regional anesthesia, there is a low incidence of headache and nerve damage.  Most commonly though, if the regional technique proves challenging, it is a difficulty in getting the numbing medicine in close proximity to the nerves perform the nerve block.  Extremely rarely (1 in 200,000), there can be a collection of blood from around the nerves.  Some patients may experience difficulty  voiding (passing urine) for a period of time after a regional technique.    Your anesthesiologist is prepared to answer any questions for you the day of surgery, so that you can formulate the best and safest possible anesthetic plan.  If you would like to set up a phone consult prior to your surgery, please email your questions and/or contact information to [email or web address] and we will contact you.

## 2020-07-10 ENCOUNTER — Inpatient Hospital Stay
Admit: 2020-07-10 | Discharge: 2020-07-11 | Disposition: A | Discharge status: Home / Self Care | Payer: MEDICARE | Attending: Emergency Medicine

## 2020-07-10 ENCOUNTER — Inpatient Hospital Stay: Payer: MEDICARE

## 2020-07-10 DIAGNOSIS — R112 Nausea with vomiting, unspecified: Secondary | ICD-10-CM

## 2020-07-10 LAB — CBC WITH AUTOMATED DIFF
BASOPHILS: 0.1 % (ref 0–3)
EOSINOPHILS: 1.1 % (ref 0–5)
HCT: 40.9 % (ref 37.0–50.0)
HGB: 14 gm/dl (ref 12.4–17.2)
IMMATURE GRANULOCYTES: 0.3 % (ref 0.0–3.0)
LYMPHOCYTES: 24.7 % — ABNORMAL LOW (ref 28–48)
MCH: 31.9 pg (ref 23.0–34.6)
MCHC: 34.2 gm/dl (ref 30.0–36.0)
MCV: 93.2 fL (ref 80.0–98.0)
MONOCYTES: 10.6 % (ref 1–13)
MPV: 11.3 fL — ABNORMAL HIGH (ref 6.0–10.0)
NEUTROPHILS: 63.2 % (ref 34–64)
NRBC: 0 (ref 0–0)
PLATELET: 149 10*3/uL (ref 140–450)
RBC: 4.39 M/uL (ref 3.80–5.70)
RDW-SD: 50.1 — ABNORMAL HIGH (ref 35.1–43.9)
WBC: 7.4 10*3/uL (ref 4.0–11.0)

## 2020-07-10 LAB — CBC WITH AUTO DIFFERENTIAL
Basophils %: 0.1 % (ref 0–3)
Eosinophils %: 1.1 % (ref 0–5)
Hematocrit: 40.9 % (ref 37.0–50.0)
Hemoglobin: 14 gm/dl (ref 12.4–17.2)
Immature Granulocytes: 0.3 % (ref 0.0–3.0)
Lymphocytes %: 24.7 % — ABNORMAL LOW (ref 28–48)
MCH: 31.9 pg (ref 23.0–34.6)
MCHC: 34.2 gm/dl (ref 30.0–36.0)
MCV: 93.2 fL (ref 80.0–98.0)
MPV: 11.3 fL — ABNORMAL HIGH (ref 6.0–10.0)
Monocytes %: 10.6 % (ref 1–13)
Neutrophils %: 63.2 % (ref 34–64)
Nucleated RBCs: 0 (ref 0–0)
Platelets: 149 10*3/uL (ref 140–450)
RBC: 4.39 M/uL (ref 3.80–5.70)
RDW-SD: 50.1 — ABNORMAL HIGH (ref 35.1–43.9)
WBC: 7.4 10*3/uL (ref 4.0–11.0)

## 2020-07-10 MED ORDER — SODIUM CHLORIDE 0.9% BOLUS IV
0.9 % | INTRAVENOUS | Status: AC
Start: 2020-07-10 — End: 2020-07-10
  Administered 2020-07-10: 23:00:00 via INTRAVENOUS

## 2020-07-10 MED ORDER — SODIUM CHLORIDE 0.9 % IJ SYRG
Freq: Three times a day (TID) | INTRAMUSCULAR | Status: DC
Start: 2020-07-10 — End: 2020-07-10

## 2020-07-10 MED ORDER — LIDOCAINE (PF) 20 MG/ML (2 %) IJ SOLN
20 mg/mL (2 %) | INTRAMUSCULAR | Status: DC | PRN
Start: 2020-07-10 — End: 2020-07-10
  Administered 2020-07-10: 12:00:00 via INTRAVENOUS

## 2020-07-10 MED ORDER — LACTATED RINGERS IV
INTRAVENOUS | Status: DC
Start: 2020-07-10 — End: 2020-07-10
  Administered 2020-07-10: 11:00:00 via INTRAVENOUS

## 2020-07-10 MED ORDER — PROPOFOL 10 MG/ML IV EMUL
10 mg/mL | INTRAVENOUS | Status: DC | PRN
Start: 2020-07-10 — End: 2020-07-10
  Administered 2020-07-10 (×13): via INTRAVENOUS

## 2020-07-10 MED ORDER — ONDANSETRON (PF) 4 MG/2 ML INJECTION
4 mg/2 mL | Freq: Once | INTRAMUSCULAR | Status: AC
Start: 2020-07-10 — End: 2020-07-10
  Administered 2020-07-10: 23:00:00 via INTRAVENOUS

## 2020-07-10 MED ORDER — SODIUM CHLORIDE 0.9 % IJ SYRG
INTRAMUSCULAR | Status: DC | PRN
Start: 2020-07-10 — End: 2020-07-10

## 2020-07-10 MED ORDER — LACTATED RINGERS IV
INTRAVENOUS | Status: DC | PRN
Start: 2020-07-10 — End: 2020-07-10
  Administered 2020-07-10 (×2): via INTRAVENOUS

## 2020-07-10 MED FILL — ONDANSETRON (PF) 4 MG/2 ML INJECTION: 4 mg/2 mL | INTRAMUSCULAR | Qty: 2

## 2020-07-10 MED FILL — LACTATED RINGERS IV: INTRAVENOUS | Qty: 1000

## 2020-07-10 NOTE — ED Notes (Signed)
Pt states no change with zofran, still nauseous and requesting further medication.

## 2020-07-10 NOTE — Progress Notes (Signed)
07/10/20 0917   Discharge Checklist   Ride and Caregiver Arranged Yes   Ride Caregiver Provider: Lacie Draft   Phone Number for Ride/Caregiver [phone]   Post-op discharge instructions given and explained to responsible party Yes    Responsible party will drive the patient home Yes    Responsible party verbalizes understanding and receipt of copy of discharge instructions Yes   Departure Mode Significant other   Discharged with Documented Belongings Yes   Mobility at [employer]

## 2020-07-10 NOTE — Anesthesia Pre-Procedure Evaluation (Signed)
Relevant Problems   RESPIRATORY SYSTEM   (+) Pneumonia   (+) Pneumonia due to COVID-19 virus      CARDIOVASCULAR   (+) Atherosclerosis of coronary artery   (+) Essential hypertension   (+) Hx of CABG   (+) Murmur, cardiac      GASTROINTESTINAL   (+) Liver cyst      RENAL FAILURE   (+) AKI (acute kidney injury) ([place])   (+) Recurrent nephrolithiasis      PERSONAL HX & FAMILY HX OF CANCER   (+) Malignant neoplasm of head of pancreas (HCC)       Anesthetic History   No history of anesthetic complications            Review of Systems / Medical History  Patient summary reviewed, nursing notes reviewed and pertinent labs reviewed    Pulmonary  Within defined limits                 Neuro/Psych         Neuromuscular disease    Comments: neuropathy Cardiovascular    Hypertension: well controlled          CAD and CABG    Exercise tolerance: >4 METS  Comments: cabg 1995  Metoprolol 2 days ago   GI/Hepatic/Renal         Renal disease: stones and CRI       Endo/Other        Arthritis and cancer     Other Findings   Comments: Pancreatic CA hx           Physical Exam    Airway  Mallampati: II    Neck ROM: normal range of motion   Mouth opening: Normal     Cardiovascular    Rhythm: regular  Rate: normal         Dental    Dentition: Poor dentition     Pulmonary  Breath sounds clear to auscultation               Abdominal  GI exam deferred       Other Findings            Anesthetic Plan    ASA: 3  Anesthesia type: general          Induction: Intravenous  Anesthetic plan and risks discussed with: Patient      I have informed the patient or their guardian of the nature and purpose of the type of anesthesia, the reasonable alternative anesthetic methods, pertinent foreseeable risks involved and the possibility of complications. I have explained that an alternative form of anesthesia may be required by unexpected conditions arising before or during the procedure. It is understood that general anesthesia may be required for safety or  comfort. Questions have been answered to the satisfaction of the patient who accepts the risks and agrees to proceed as planned. The above anesthetic review of medical history, physical exam, tests, assessment, subsequent anesthetic plan and consent have been accomplished pre-procedure.

## 2020-07-10 NOTE — ED Provider Notes (Signed)
[place]  Emergency Department Treatment Report    Patient: Tommy Brown Age: 75 y.o. Sex: male    Date of Birth: [birth year] Admit Date: 07/10/2020 PCP: Daralene Milch, MD   MRN: [id number]  CSN: [id number]     Room: ER33/ER33 Time Dictated: 6:12 PM          Chief Complaint   Nausea and vomiting    History of Present Illness   75 y.o. male presents with nausea and vomiting since having colonoscopy and endoscopy this morning by Dr. Riley Kill.  He states he ate a small amount of eggs after returning home but has been unable to keep anything down since that time, denies fevers or chills denies abdominal pain.    Review of Systems   Constitutional: No fever, chills, or weight loss  Eyes: No visual symptoms.  ENT: No sore throat, runny nose or ear pain.  Respiratory: No cough, dyspnea or wheezing.  Cardiovascular: No chest pain, pressure, palpitations, tightness or heaviness.  Gastrointestinal: No abdominal pain, positive nausea and vomiting  Genitourinary: No dysuria, frequency, or urgency.  Musculoskeletal: No joint pain or swelling.  Integumentary: No rashes.  Neurological: No headaches, sensory or motor symptoms.  Denies complaints in all other systems.    Past Medical/Surgical History     Past Medical History:   Diagnosis Date   ??? Arthritis    ??? Benign prostate hyperplasia    ??? Bladder infection    ??? Chronic kidney disease    ??? Coronary arteriosclerosis    ??? Coronary atherosclerosis of artery bypass graft    ??? Decreased testosterone level    ??? Diverticular disease    ??? Essential hypertension    ??? Gout    ??? History of acute renal failure    ??? History of anemia    ??? History of malignant neoplasm of pancreas    ??? Hx of CABG    ??? Kidney stone    ??? Kidney stones    ??? Neuropathy     NECK, SHOULDERS   ??? Pancreatic cancer ([place])    ??? Sepsis ([place])      Past Surgical History:   Procedure Laterality Date   ??? HX CERVICAL FUSION     ??? HX COLONOSCOPY     ??? HX CORONARY ARTERY BYPASS GRAFT  1995    ??? HX HEART CATHETERIZATION  2006   ??? HX LUMBAR DISKECTOMY     ??? HX OTHER SURGICAL  01/01/2011    whipple procedure done for pancreatic cancer   ??? HX UROLOGICAL  2012    Percutaneous extraction of a kidney stone w/ fragmentation procedure    ??? PR CABG, ARTERY-VEIN, FOUR  1995       Social History     Social History     Socioeconomic History   ??? Marital status: MARRIED   Tobacco Use   ??? Smoking status: Never Smoker   ??? Smokeless tobacco: Never Used   Vaping Use   ??? Vaping Use: Never used   Substance and Sexual Activity   ??? Alcohol use: Not Currently   ??? Drug use: Not Currently   ??? Sexual activity: Not Currently   Social History Narrative    ** Merged History Encounter **            Family History     Family History   Problem Relation Age of Onset   ??? Alcohol abuse Father    ??? Heart Disease  Brother    ??? Emphysema Brother        Current Medications     Cannot display prior to admission medications because the patient has not been admitted in this contact.       Allergies     Allergies   Allergen Reactions   ??? Other Food Other (comments)     VISTAID  ANESTHESIA   ??? Codeine Itching and Swelling   ??? Oxycodone Anaphylaxis   ??? Hydroxyzine Pamoate Itching and Other (comments)     Other reaction(s): Unknown     ??? Meperidine Itching, Palpitations, Swelling and Other (comments)     Other reaction(s): Unknown     ??? Metoclopramide Itching, Other (comments) and Swelling     Other reaction(s): Other (See Comments)  Reaction unknown   Reaction unknown      ??? Morphine Other (comments) and Nausea and Vomiting   ??? Oxycodone-Acetaminophen Itching   ??? Prochlorperazine Itching, Other (comments) and Unknown (comments)     Other reaction(s): Unknown  Unsure of reaction     ??? Hydroxyzine Hcl Hives       Physical Exam     ED Triage Vitals   ED Encounter Vitals Group      BP       Pulse       Resp       Temp       Temp src       SpO2       Weight       Height        Constitutional: Well-developed, awake, alert  HEENT: Anicteric sclera  nasopharynx and oropharynx unremarkable  Neck: supple, non tender, no masses   Respiratory: lungs clear to auscultation, nonlabored respirations. No tachypnea or accessory muscle use.  Cardiovascular: heart regular rate and rhythm without murmur rubs or gallops.   Calves soft and non-tender.  No peripheral edema or significant variscosities.    Gastrointestinal:  Abdomen soft, nontender without complaint of pain to palpation  Musculoskeletal: Full range of motion of extremities, nontender  Integumentary: warm and dry without rashes or lesions  Neurologic: alert and oriented, sensory and motor intact, grossly nonfocal exam      Impression and Management Plan   Nausea and vomiting following colonoscopy and endoscopy, will check laboratory studies administer fluids as well as antiemetics and reevaluate    Diagnostic Studies   Lab:   Recent Results (from the past 12 hour(s))   CBC WITH AUTOMATED DIFF    Collection Time: 07/10/20  7:10 PM   Result Value Ref Range    WBC 7.4 4.0 - 11.0 1000/mm3    RBC 4.39 3.80 - 5.70 M/uL    HGB 14.0 12.4 - 17.2 gm/dl    HCT 40.9 37.0 - 50.0 %    MCV 93.2 80.0 - 98.0 fL    MCH 31.9 23.0 - 34.6 pg    MCHC 34.2 30.0 - 36.0 gm/dl    PLATELET 149 140 - 450 1000/mm3    MPV 11.3 (H) 6.0 - 10.0 fL    RDW-SD 50.1 (H) 35.1 - 43.9      NRBC 0 0 - 0      IMMATURE GRANULOCYTES 0.3 0.0 - 3.0 %    NEUTROPHILS 63.2 34 - 64 %    LYMPHOCYTES 24.7 (L) 28 - 48 %    MONOCYTES 10.6 1 - 13 %    EOSINOPHILS 1.1 0 - 5 %    BASOPHILS 0.1 0 -  3 %   METABOLIC PANEL, COMPREHENSIVE    Collection Time: 07/10/20  7:10 PM   Result Value Ref Range    Potassium 3.7 3.4 - 4.5 mEq/L    Chloride 106 98 - 107 mEq/L    Sodium 142 136 - 145 mEq/L    CO2 26 20 - 31 mEq/L    Glucose 104 74 - 106 mg/dl    BUN 12 9 - 23 mg/dl    Creatinine 1.14 0.70 - 1.30 mg/dl    GFR est AA >60      GFR est non-AA >60      Calcium 8.9 8.7 - 10.4 mg/dl    AST (SGOT) 42.0 (H) 0.0 - 33.9 U/L    ALT (SGPT) 57 (H) 10 - 49 U/L    Alk. phosphatase 137  (H) 46 - 116 U/L    Bilirubin, total 0.60 0.30 - 1.20 mg/dl    Protein, total 6.9 5.7 - 8.2 gm/dl    Albumin 2.4 (L) 3.4 - 5.0 gm/dl    Anion gap 10 5 - 15 mmol/L   LIPASE    Collection Time: 07/10/20  7:10 PM   Result Value Ref Range    Lipase 16 12 - 53 U/L         ED Course/Medical Decision Making      Patient hydrated in the emergency department received Zofran, complaining of continued nausea and subsequently received Phenergan IV.  On reevaluation, patient feels better and feels comfortable going home.  Patient received a second liter of fluid in the emergency department.  I will send home with prescription for Phenergan as needed for nausea vomiting with recommendations follow-up with PCP as well as with gastroenterology Dr. Riley Kill.  Return precautions discussed.  Medications   promethazine (PHENERGAN) 25 mg in NS IVPB (has no administration in time range)   sodium chloride 0.9 % bolus infusion 1,000 mL (1,000 mL IntraVENous New Bag 07/10/20 1904)   ondansetron (ZOFRAN) injection 4 mg (4 mg IntraVENous Given 07/10/20 1905)         In light of the current COVID-19 Pandemic, patient in mask and provider in N-95 mask, goggles and gloves.  Final Diagnosis       ICD-10-CM ICD-9-CM   1. Nausea and vomiting, unspecified vomiting type  R11.2 787.01       Disposition   Home    Joice Lofts, MD  Jul 10, 2020    My signature above authenticates this document and my orders, the final    diagnosis (es), discharge prescription (s), and instructions in the Epic    record.  If you have any questions please contact [phone].     Nursing notes have been reviewed by the physician/ advanced practice    Clinician.    Dragon medical dictation software was used for portions of this report. Unintended voice recognition grammatical errors may occur.

## 2020-07-10 NOTE — Anesthesia Post-Procedure Evaluation (Signed)
Procedure(s):  ESOPHAGOGASTRODUODENOSCOPY (EGD) w/BX  DIAGNOSTIC COLONOSCOPY polypectomy w/hot snare, cold snare , Clip x 1 .    general    Anesthesia Post Evaluation      Multimodal analgesia: multimodal analgesia used between 6 hours prior to anesthesia start to PACU discharge  Patient location during evaluation: PACU  Patient participation: complete - patient participated  Level of consciousness: awake  Pain management: satisfactory to patient  Airway patency: patent  Anesthetic complications: no  Cardiovascular status: acceptable  Respiratory status: acceptable  Hydration status: acceptable  Post anesthesia nausea and vomiting:  none  Final Post Anesthesia Temperature Assessment:  Normothermia (36.0-37.5 degrees C)      INITIAL Post-op Vital signs:   Vitals Value Taken Time   BP 133/92 07/10/20 0900   Temp     Pulse 72 07/10/20 0849   Resp 16 07/10/20 0855   SpO2 96 % 07/10/20 0908   Vitals shown include unvalidated device data.

## 2020-07-10 NOTE — Progress Notes (Signed)
07/10/20 0939   Discharge Checklist   Ride and Caregiver Arranged Yes   Ride Caregiver Provider: Marjo Bicker   Post-op discharge instructions given and explained to responsible party Yes   Marjo Bicker)   Responsible party will drive the patient home Yes    Responsible party verbalizes understanding and receipt of copy of discharge instructions Yes   Departure Mode Significant other   Discharged with Documented Belongings Yes   Mobility at [employer]

## 2020-07-10 NOTE — H&P (Signed)
H&P by Jonna Coup,  MD at 07/10/20 [phone]                Author: Jonna Coup, MD  Service: Gastroenterology  Author Type: Physician       Filed: 07/10/20 0746  Date of Service: 07/10/20 0743  Status: Signed          Editor: Jonna Coup, MD (Physician)                          Endoscopy Pre Procedural Patient History and Evaluation      Jul 10, 2020 7:43 AM       Tommy Brown is a 75 y.o. male    PCP: Cleatis Polka, MD      Procedure:  EGD with Colon Flip      Indications: Dysphagia, diverticulitis        Allergies        Allergen  Reactions         ?  Other Food  Other (comments)             VISTAID   ANESTHESIA         ?  Codeine  Itching and Swelling     ?  Oxycodone  Anaphylaxis     ?  Hydroxyzine Pamoate  Itching and Other (comments)             Other reaction(s): Unknown            ?  Meperidine  Itching, Palpitations, Swelling and Other (comments)             Other reaction(s): Unknown            ?  Metoclopramide  Itching, Other (comments) and Swelling             Other reaction(s): Other (See Comments)   Reaction unknown    Reaction unknown             ?  Morphine  Other (comments) and Nausea and Vomiting     ?  Oxycodone-Acetaminophen  Itching     ?  Prochlorperazine  Itching, Other (comments) and Unknown (comments)             Other reaction(s): Unknown   Unsure of reaction            ?  Hydroxyzine Hcl  Hives           Current Medications:     Prior to Admission Medications     Prescriptions  Last Dose  Informant  Patient Reported?  Taking?      allopurinol (ZYLOPRIM) 300 mg tablet  07/08/2020    Yes  No      Sig: Take  by mouth daily.      amLODIPine (NORVASC) 5 mg tablet  07/08/2020    Yes  No      Sig: Take 5 mg by mouth daily.      aspirin 81 mg chewable tablet  07/08/2020    Yes  No      Sig: Take 81 mg by mouth daily.      atorvastatin (LIPITOR) 20 mg tablet  07/08/2020    Yes  No      Sig: TAKE 1 TABLET BY MOUTH EVERY DAY      bimatoprost (Lumigan) 0.01 % ophthalmic  drops  07/08/2020    Yes  No      Sig: Administer  1 Drop to both eyes nightly.      furosemide (LASIX) 20 mg tablet  07/08/2020    Yes  No      Sig: daily.      gabapentin (NEURONTIN) 600 mg tablet  07/08/2020    Yes  No      Sig: Take 600 mg by mouth two (2) times a day.      lipase-protease-amylase (Creon) 24,000-76,000 -120,000 unit capsule  07/08/2020    Yes  No      Sig: Take 2 Capsules by mouth four (4) times daily (with meals).      metoprolol succinate (TOPROL-XL) 25 mg XL tablet  07/08/2020    Yes  No      Sig: TAKE 1 TABLET BY MOUTH EVERY DAY      naproxen (NAPROSYN) 500 mg tablet  07/08/2020    Yes  No      Sig: TAKE 1 TABLET BY MOUTH TWICE A DAY WITH FOOD      ondansetron (ZOFRAN ODT) 4 mg disintegrating tablet  Unknown at Unknown time    No  No      Sig: Take 1 Tablet by mouth every eight (8) hours as needed for Nausea.      peg 400-propylene glycol (Systane, propylene glycol,) 0.4-0.3 % drop  07/08/2020    Yes  No      Sig: Administer 2 Drops to both eyes daily. Patient places two drops in both eyes every AM.   Indications: dry eye      promethazine (PHENERGAN) 12.5 mg tablet  07/08/2020    Yes  No      Sig: promethazine 12.5 mg tablet    TAKE 1 TABLET BY MOUTH 2 TIMES PER DAY AS NEEDED      Patient not taking: Reported on 09/08/2019      tamsulosin (Flomax) 0.4 mg capsule  07/08/2020    No  No      Sig: Take 2 Caps by mouth daily.      temazepam (RESTORIL) 7.5 mg capsule  07/08/2020    Yes  No      Sig: Take  by mouth nightly.      testosterone cypionate (DEPOTESTOTERONE CYPIONATE) 200 mg/mL injection  07/08/2020    Yes  No      Sig: INJECT 0.5 MILLILITER INTO THE MUSCLE EVERY 2 WEEKS DISCARD VIAL AFTER USE      tiZANidine (ZANAFLEX) 4 mg tablet  07/08/2020    Yes  No      Sig: tizanidine 4 mg tablet    TAKE 1 TABLET BY MOUTH EACH DAY AT BEDTIME AS NEEDED      Patient not taking: Reported on 09/08/2019      triamcinolone acetonide (KENALOG) 0.1 % topical cream  07/08/2020    Yes  No      Sig: triamcinolone acetonide  0.1 % topical cream               Facility-Administered Medications: None        Home medications were reviewed with patient.      History:     Past Medical History:        Diagnosis  Date         ?  Arthritis       ?  Benign prostate hyperplasia       ?  Bladder infection       ?  Chronic kidney disease       ?  Coronary arteriosclerosis       ?  Coronary atherosclerosis of artery bypass graft       ?  Decreased testosterone level       ?  Diverticular disease       ?  Essential hypertension       ?  Gout       ?  History of acute renal failure       ?  History of anemia       ?  History of malignant neoplasm of pancreas       ?  Hx of CABG       ?  Kidney stone       ?  Kidney stones       ?  Neuropathy            NECK, SHOULDERS         ?  Pancreatic cancer ([place])           ?  Sepsis [facility])            Past Surgical History:         Procedure  Laterality  Date          ?  HX CERVICAL FUSION         ?  HX COLONOSCOPY         ?  HX CORONARY ARTERY BYPASS GRAFT    1995     ?  HX HEART CATHETERIZATION    2006     ?  HX LUMBAR DISKECTOMY         ?  HX OTHER SURGICAL    01/01/2011          whipple procedure done for pancreatic cancer          ?  HX UROLOGICAL    2012          Percutaneous extraction of a kidney stone w/ fragmentation procedure           ?  PR CABG, ARTERY-VEIN, FOUR    1995          Family History         Problem  Relation  Age of Onset          ?  Alcohol abuse  Father       ?  Heart Disease  Brother            ?  Emphysema  Brother            Social History          Socioeconomic History         ?  Marital status:  MARRIED              Spouse name:  Not on file         ?  Number of children:  Not on file     ?  Years of education:  Not on file     ?  Highest education level:  Not on file       Occupational History        ?  Not on file       Tobacco Use         ?  Smoking status:  Never Smoker     ?  Smokeless tobacco:  Never Used       Vaping Use         ?  Vaping Use:  Never  used       Substance and  Sexual Activity         ?  Alcohol use:  Not Currently     ?  Drug use:  Not Currently     ?  Sexual activity:  Not Currently        Other Topics  Concern        ?  Not on file       Social History Narrative          ** Merged History Encounter **                     Social Determinants of Health          Financial Resource Strain:         ?  Difficulty of Paying Living Expenses: Not on file       Food Insecurity:         ?  Worried About Programme researcher, broadcasting/film/video in the Last Year: Not on file     ?  Ran Out of Food in the Last Year: Not on file       Transportation Needs:         ?  Lack of Transportation (Medical): Not on file     ?  Lack of Transportation (Non-Medical): Not on file       Physical Activity:         ?  Days of Exercise per Week: Not on file     ?  Minutes of Exercise per Session: Not on file       Stress:         ?  Feeling of Stress : Not on file       Social Connections:         ?  Frequency of Communication with Friends and Family: Not on file     ?  Frequency of Social Gatherings with Friends and Family: Not on file     ?  Attends Religious Services: Not on file     ?  Active Member of Clubs or Organizations: Not on file     ?  Attends Banker Meetings: Not on file     ?  Marital Status: Not on file       Intimate Partner Violence:         ?  Fear of Current or Ex-Partner: Not on file     ?  Emotionally Abused: Not on file     ?  Physically Abused: Not on file     ?  Sexually Abused: Not on file       Housing Stability:         ?  Unable to Pay for Housing in the Last Year: Not on file     ?  Number of Places Lived in the Last Year: Not on file        ?  Unstable Housing in the Last Year: Not on file           Physical Exam:   Visit Vitals      BP  (!) 177/92 (BP 1 Location: Left arm, BP Patient Position: Sitting)     Pulse  84     Temp  98.1 ??F (36.7 ??C)     Resp  16     Ht  5\' 6"  (1.676 m)     Wt  60.3 kg (132 lb 15  oz)     SpO2  95%        BMI  21.46 kg/m??           Heart: +s1s2    Lung:  Clear anteriorly   Abdomen:  Soft, NT, +BS   Mental:  Alert      Impression:   Patient is an appropriate candidate for sedation.      Plan:   1.  Proceed to scheduled procedure.   2.  Consent has been obtained.      Dante Gang, MD   Jul 10, 2020

## 2020-07-10 NOTE — ED Notes (Signed)
Pt to ED from home via EMS for c/o ongoing n/d since colonoscopy this AM. States concerned for dehydration.    Pt arrives A&Ox4, in NAD, VSS, resp easy/nonlabored, speaking clearly in full sentences, skin pink/warm/dry.

## 2020-07-11 LAB — METABOLIC PANEL, COMPREHENSIVE
ALT (SGPT): 57 U/L — ABNORMAL HIGH (ref 10–49)
AST (SGOT): 42 U/L — ABNORMAL HIGH (ref 0.0–33.9)
Albumin: 2.4 gm/dl — ABNORMAL LOW (ref 3.4–5.0)
Alk. phosphatase: 137 U/L — ABNORMAL HIGH (ref 46–116)
Anion gap: 10 mmol/L (ref 5–15)
BUN: 12 mg/dl (ref 9–23)
Bilirubin, total: 0.6 mg/dl (ref 0.30–1.20)
CO2: 26 mEq/L (ref 20–31)
Calcium: 8.9 mg/dl (ref 8.7–10.4)
Chloride: 106 mEq/L (ref 98–107)
Creatinine: 1.14 mg/dl (ref 0.70–1.30)
GFR est AA: >60
GFR est non-AA: >60
Glucose: 104 mg/dl (ref 74–106)
Potassium: 3.7 mEq/L (ref 3.4–4.5)
Protein, total: 6.9 gm/dl (ref 5.7–8.2)
Sodium: 142 mEq/L (ref 136–145)

## 2020-07-11 LAB — LIPASE
Lipase: 16 U/L (ref 12–53)
Lipase: 16 U/L (ref 12–53)

## 2020-07-11 LAB — COMPREHENSIVE METABOLIC PANEL
ALT: 57 U/L — ABNORMAL HIGH (ref 10–49)
AST: 42 U/L — ABNORMAL HIGH (ref 0.0–33.9)
Albumin: 2.4 gm/dl — ABNORMAL LOW (ref 3.4–5.0)
Alkaline Phosphatase: 137 U/L — ABNORMAL HIGH (ref 46–116)
Anion Gap: 10 mmol/L (ref 5–15)
BUN: 12 mg/dl (ref 9–23)
CO2: 26 mEq/L (ref 20–31)
Calcium: 8.9 mg/dl (ref 8.7–10.4)
Chloride: 106 mEq/L (ref 98–107)
Creatinine: 1.14 mg/dl (ref 0.70–1.30)
EGFR IF NonAfrican American: >60
GFR African American: >60
Glucose: 104 mg/dl (ref 74–106)
Potassium: 3.7 mEq/L (ref 3.4–4.5)
Sodium: 142 mEq/L (ref 136–145)
Total Bilirubin: 0.6 mg/dl (ref 0.30–1.20)
Total Protein: 6.9 gm/dl (ref 5.7–8.2)

## 2020-07-11 MED ORDER — SODIUM CHLORIDE 0.9% BOLUS IV
0.9 % | INTRAVENOUS | Status: AC
Start: 2020-07-11 — End: 2020-07-11
  Administered 2020-07-11: 03:00:00 via INTRAVENOUS

## 2020-07-11 MED ORDER — PROMETHAZINE IN NS 25 MG/50 ML IV PIGGY BAG
25 mg/50 ml | INTRAVENOUS | Status: AC
Start: 2020-07-11 — End: 2020-07-10
  Administered 2020-07-11: 01:00:00 via INTRAVENOUS

## 2020-07-11 MED ORDER — PROMETHAZINE 25 MG TAB
25 mg | ORAL_TABLET | Freq: Four times a day (QID) | ORAL | 0 refills | Status: DC | PRN
Start: 2020-07-11 — End: 2021-03-29

## 2020-07-11 MED ORDER — SODIUM CHLORIDE 0.9% BOLUS IV
0.9 % | INTRAVENOUS | Status: DC
Start: 2020-07-11 — End: 2020-07-11

## 2020-07-11 MED FILL — PROMETHAZINE IN NS 25 MG/50 ML IV PIGGY BAG: 25 mg/50 ml | INTRAVENOUS | Qty: 50

## 2020-07-11 NOTE — ED Notes (Signed)
At time of DC, pt A&OX4, well appearing and states feeling better.  Pt verbalizes understanding of DC instructions and f/u.

## 2020-07-31 ENCOUNTER — Encounter: Attending: Urology | Primary: Family Medicine

## 2020-07-31 ENCOUNTER — Encounter: Primary: Family Medicine

## 2020-09-13 ENCOUNTER — Encounter: Primary: Family Medicine

## 2020-09-13 ENCOUNTER — Encounter: Attending: Urology | Primary: Family Medicine

## 2020-09-24 ENCOUNTER — Inpatient Hospital Stay
Admit: 2020-09-24 | Discharge: 2020-09-24 | Disposition: A | Discharge status: Home / Self Care | Payer: MEDICARE | Attending: Emergency Medicine

## 2020-09-24 DIAGNOSIS — E860 Dehydration: Secondary | ICD-10-CM

## 2020-09-24 LAB — POC URINE MACROSCOPIC
Bilirubin, Urine: NEGATIVE
Bilirubin: NEGATIVE
Blood, Urine: NEGATIVE
Blood: NEGATIVE
Glucose, Ur: NEGATIVE mg/dl
Glucose: NEGATIVE mg/dl
Ketone: NEGATIVE mg/dl
Ketones, Urine: NEGATIVE mg/dl
Leukocyte Esterase, Urine: NEGATIVE
Leukocyte Esterase: NEGATIVE
Nitrite, Urine: NEGATIVE
Nitrites: NEGATIVE
Protein, UA: NEGATIVE mg/dl
Protein: NEGATIVE mg/dl
Specific Gravity, UA: 1.01 (ref 1.005–1.030)
Specific gravity: 1.01 (ref 1.005–1.030)
Urobilinogen, UA, POCT: 0.2 EU/dl (ref 0.0–1.0)
Urobilinogen: 0.2 EU/dl (ref 0.0–1.0)
pH (UA): 6.5 (ref 5–9)
pH, UA: 6.5 (ref 5–9)

## 2020-09-24 LAB — METABOLIC PANEL, COMPREHENSIVE
ALT (SGPT): 40 U/L (ref 10–49)
AST (SGOT): 76 U/L — ABNORMAL HIGH (ref 0.0–33.9)
Albumin: 3.9 gm/dl (ref 3.4–5.0)
Alk. phosphatase: 108 U/L (ref 46–116)
Anion gap: 7 mmol/L (ref 5–15)
BUN: 15 mg/dl (ref 9–23)
Bilirubin, total: 0.6 mg/dl (ref 0.30–1.20)
CO2: 25 mEq/L (ref 20–31)
Calcium: 9.1 mg/dl (ref 8.7–10.4)
Chloride: 107 mEq/L (ref 98–107)
Creatinine: 1.25 mg/dl (ref 0.70–1.30)
GFR est AA: >60
GFR est non-AA: 60
Glucose: 105 mg/dl (ref 74–106)
Potassium: 4.9 mEq/L — ABNORMAL HIGH (ref 3.4–4.5)
Protein, total: 7.5 gm/dl (ref 5.7–8.2)
Sodium: 139 mEq/L (ref 136–145)

## 2020-09-24 LAB — CBC WITH AUTOMATED DIFF
BASOPHILS: 0.2 % (ref 0–3)
EOSINOPHILS: 1.7 % (ref 0–5)
HCT: 44.2 % (ref 37.0–50.0)
HGB: 14.2 gm/dl (ref 12.4–17.2)
IMMATURE GRANULOCYTES: 0.3 % (ref 0.0–3.0)
LYMPHOCYTES: 27.8 % — ABNORMAL LOW (ref 28–48)
MCH: 30.3 pg (ref 23.0–34.6)
MCHC: 32.1 gm/dl (ref 30.0–36.0)
MCV: 94.2 fL (ref 80.0–98.0)
MONOCYTES: 6.1 % (ref 1–13)
MPV: 11.3 fL — ABNORMAL HIGH (ref 6.0–10.0)
NEUTROPHILS: 63.9 % (ref 34–64)
NRBC: 0 (ref 0–0)
PLATELET: 188 10*3/uL (ref 140–450)
RBC: 4.69 M/uL (ref 3.80–5.70)
RDW-SD: 47.9 — ABNORMAL HIGH (ref 35.1–43.9)
WBC: 6.6 10*3/uL (ref 4.0–11.0)

## 2020-09-24 LAB — MAGNESIUM
Magnesium: 1.9 mg/dL (ref 1.6–2.6)
Magnesium: 1.9 mg/dL (ref 1.6–2.6)

## 2020-09-24 LAB — TSH 3RD GENERATION
TSH: 1.401 u[IU]/mL (ref 0.550–4.780)
TSH: 1.401 u[IU]/mL (ref 0.550–4.780)

## 2020-09-24 LAB — TROPONIN-HIGH SENSITIVITY: Troponin-High Sensitivity: 4 ng/L (ref 0–59)

## 2020-09-24 LAB — T4, FREE
FREE T4, FT4T: 0.93 ng/dl (ref 0.89–1.76)
Free T4: 0.93 ng/dl (ref 0.89–1.76)

## 2020-09-24 LAB — CBC WITH AUTO DIFFERENTIAL
Basophils %: 0.2 % (ref 0–3)
Eosinophils %: 1.7 % (ref 0–5)
Hematocrit: 44.2 % (ref 37.0–50.0)
Hemoglobin: 14.2 gm/dl (ref 12.4–17.2)
Immature Granulocytes: 0.3 % (ref 0.0–3.0)
Lymphocytes %: 27.8 % — ABNORMAL LOW (ref 28–48)
MCH: 30.3 pg (ref 23.0–34.6)
MCHC: 32.1 gm/dl (ref 30.0–36.0)
MCV: 94.2 fL (ref 80.0–98.0)
MPV: 11.3 fL — ABNORMAL HIGH (ref 6.0–10.0)
Monocytes %: 6.1 % (ref 1–13)
Neutrophils %: 63.9 % (ref 34–64)
Nucleated RBCs: 0 (ref 0–0)
Platelets: 188 10*3/uL (ref 140–450)
RBC: 4.69 M/uL (ref 3.80–5.70)
RDW-SD: 47.9 — ABNORMAL HIGH (ref 35.1–43.9)
WBC: 6.6 10*3/uL (ref 4.0–11.0)

## 2020-09-24 LAB — COMPREHENSIVE METABOLIC PANEL
ALT: 40 U/L (ref 10–49)
AST: 76 U/L — ABNORMAL HIGH (ref 0.0–33.9)
Albumin: 3.9 gm/dl (ref 3.4–5.0)
Alkaline Phosphatase: 108 U/L (ref 46–116)
Anion Gap: 7 mmol/L (ref 5–15)
BUN: 15 mg/dl (ref 9–23)
CO2: 25 mEq/L (ref 20–31)
Calcium: 9.1 mg/dl (ref 8.7–10.4)
Chloride: 107 mEq/L (ref 98–107)
Creatinine: 1.25 mg/dl (ref 0.70–1.30)
EGFR IF NonAfrican American: 60
GFR African American: >60
Glucose: 105 mg/dl (ref 74–106)
Potassium: 4.9 mEq/L — ABNORMAL HIGH (ref 3.4–4.5)
Sodium: 139 mEq/L (ref 136–145)
Total Bilirubin: 0.6 mg/dl (ref 0.30–1.20)
Total Protein: 7.5 gm/dl (ref 5.7–8.2)

## 2020-09-24 LAB — TROPONIN, HIGH SENSITIVITY: Troponin, High Sensitivity: 4 ng/L (ref 0–59)

## 2020-09-24 MED ORDER — SODIUM CHLORIDE 0.9 % IJ SYRG
Freq: Once | INTRAMUSCULAR | Status: AC
Start: 2020-09-24 — End: 2020-09-24
  Administered 2020-09-24: 17:00:00 via INTRAVENOUS

## 2020-09-24 MED ORDER — SODIUM CHLORIDE 0.9% BOLUS IV
0.9 % | INTRAVENOUS | Status: AC
Start: 2020-09-24 — End: 2020-09-24
  Administered 2020-09-24: 17:00:00 via INTRAVENOUS

## 2020-09-24 NOTE — ED Notes (Signed)
Pt given blankets

## 2020-09-24 NOTE — ED Notes (Signed)
I have reviewed discharge instructions with the patient.  The patient verbalized understanding.

## 2020-09-24 NOTE — ED Notes (Signed)
Pt comes by ems from home stating he doesn't feel well - informed medics he normally get dehydrated every few months

## 2020-09-24 NOTE — ED Notes (Signed)
Pt ambulatory to bathroom with steady gait. Pt able to provide urine sample. Urine dipped and sent upstairs

## 2020-09-24 NOTE — ED Notes (Signed)
Pt ambulatory to EKG room with steady gait 1325

## 2020-09-24 NOTE — ED Provider Notes (Signed)
[facility]  Emergency Department Treatment Report    Patient: Tommy Brown Age: 75 y.o. Sex: male    Date of Birth: 05-03-1945 Admit Date: 09/24/2020 PCP: Daralene Milch, MD   MRN: [id number]  CSN: [id number]     Room: H10/H10 Time Dictated: 12:19 PM      I  Chief Complaint   Pt comes by ems from home stating he doesn't feel well - informed medics he normally get dehydrated every few months   History of Present Illness   75 y.o. male who states that he started to feel weak and tired all over.  Denies any specific complaints otherwise.  He said this felt similar ways when he has been dehydrated in the past.  Not sure why he would be dehydrated she drinks 4 bottles of water a day.  Is not working in the heat.  Goes to [employer] for work every morning is doing well there.  He does want to "get ahead of this" before he got worse.    Review of Systems   Constitutional: No fever, chills, or weight loss  Eyes: No visual symptoms.  ENT: No sore throat, runny nose or ear pain.  Respiratory: No cough, dyspnea or wheezing.  Cardiovascular: No chest pain, pressure, palpitations, tightness or heaviness.  Gastrointestinal: No vomiting, diarrhea or abdominal pain.  Genitourinary: No dysuria, frequency, or urgency.  Musculoskeletal: No joint pain or swelling.  Integumentary: No rashes.  Neurological: No headaches, sensory or motor symptoms.  Denies complaints in all other systems.        Past Medical/Surgical History     Past Medical History:   Diagnosis Date    Arthritis     Benign prostate hyperplasia     Bladder infection     Chronic kidney disease     Coronary arteriosclerosis     Coronary atherosclerosis of artery bypass graft     Decreased testosterone level     Diverticular disease     Essential hypertension     Gout     History of acute renal failure     History of anemia     History of malignant neoplasm of pancreas     Hx of CABG     Kidney stone     Kidney stones     Neuropathy     NECK,  SHOULDERS    Pancreatic cancer ([place])     Sepsis ([place])      Past Surgical History:   Procedure Laterality Date    COLONOSCOPY N/A 07/10/2020    DIAGNOSTIC COLONOSCOPY polypectomy w/hot snare, cold snare , Clip x 1  performed by Dante Gang, MD at [facility] ENDOSCOPY    HX CERVICAL FUSION      HX COLONOSCOPY      [place]    HX HEART CATHETERIZATION  2006    HX LUMBAR DISKECTOMY      HX OTHER SURGICAL  01/01/2011    whipple procedure done for pancreatic cancer    HX UROLOGICAL  2012    Percutaneous extraction of a kidney stone w/ fragmentation procedure     PR CABG, ARTERY-VEIN, FOUR  1995     Discharge diagnosis: 12/2018       Acute hypoxic respiratory failure  COVID-19 infection  Multifocal pneumonia  Pancytopenia  UTI with Klebsiella pneumoniae  Hypertension  CAD status post CABG  Gout  Pancreatic cancer  Neuropathy  Has had a heart murmur  since he was a teenager  Social History     Social History     Socioeconomic History    Marital status: MARRIED   Tobacco Use    Smoking status: Never    Smokeless tobacco: Never   Vaping Use    Vaping Use: Never used   Substance and Sexual Activity    Alcohol use: Not Currently    Drug use: Not Currently    Sexual activity: Not Currently   Social History Narrative    ** Merged History Encounter **          No recent travel  Family History     Family History   Problem Relation Age of Onset    Alcohol abuse Father     Heart Disease Brother     Emphysema Brother      Does not live with anybody ill  Current Medications     Cannot display prior to admission medications because the patient has not been admitted in this contact.       Allergies     Allergies   Allergen Reactions    Other Food Other (comments)     VISTAID  ANESTHESIA    Codeine Itching and Swelling    Oxycodone Anaphylaxis    Hydroxyzine Pamoate Itching and Other (comments)     Other reaction(s): Unknown      Meperidine Itching, Palpitations, Swelling and Other (comments)     Other reaction(s):  Unknown      Metoclopramide Itching, Other (comments) and Swelling     Other reaction(s): Other (See Comments)  Reaction unknown   Reaction unknown       Morphine Other (comments) and Nausea and Vomiting    Oxycodone-Acetaminophen Itching    Prochlorperazine Itching, Other (comments) and Unknown (comments)     Other reaction(s): Unknown  Unsure of reaction      Hydroxyzine Hcl Hives       Physical Exam     ED Triage Vitals [09/24/20 1211]   Enc Vitals Group      BP (!) 148/84      Pulse (Heart Rate) 70      Resp Rate 16      Temp 98.1 ??F (36.7 ??C)      Temp src       O2 Sat (%) 97 %      Weight       Height       Head Circumference       Peak Flow       Pain Score       Pain Loc       Pain Edu?       Excl. in [place]?      Constitutional: Patient appears well developed and well nourished. Marland Kitchen Appearance and behavior are age and situation appropriate.  Ambulates steadily quickly without difficulty  HEENT: Conjunctiva clear.  PERRLA. Mucous membranes moist, non-erythematous. Surface of the pharynx, palate, and tongue are pink, moist and without lesions. Tympanic membranes clear, skull atraumatic  Neck: supple, non tender, symmetrical, no masses or JVD.   Respiratory: lungs clear to auscultation, nonlabored respirations. No tachypnea or accessory muscle use.  Cardiovascular: heart regular rate and rhythm without  rubs or gallops.  Has a 3/6 systolic ejection murmur which the patient said has been there for very long time.  Calves soft and non-tender. Distal pulses 2+ and equal bilaterally.  No peripheral edema .    Gastrointestinal:  Abdomen soft, nontender without complaint  of pain to palpation.  No guarding or rebound.  Normal active bowel sounds.  Musculoskeletal: Nail beds pink with prompt capillary refill.  Full range of motion of all joints, no bony tenderness in arms, legs and spine.   Integumentary: warm and dry without rashes or lesions  Neurologic: Sensation intact, motor strength equal and symmetric.  No facial  asymmetry or dysarthria.  Psychiatric: Alert, oriented x3, appropriate mood and affect          Impression and Management Plan   Patient presents with feeling weak and fatigued all over.  His symptoms are not localizing.  We will evaluate for anemia, electrolyte abnormality, dehydration, urine infection.    Diagnostic Studies   Lab:   Labs Reviewed   CBC WITH AUTOMATED DIFF - Abnormal; Notable for the following components:       Result Value    MPV 11.3 (*)     RDW-SD 47.9 (*)     LYMPHOCYTES 27.8 (*)     All other components within normal limits   METABOLIC PANEL, COMPREHENSIVE - Abnormal; Notable for the following components:    Potassium 4.9 (*)     AST (SGOT) 76.0 (*)     All other components within normal limits   T4, FREE   TSH 3RD GENERATION   TROPONIN-HIGH SENSITIVITY   MAGNESIUM   POC URINE MACROSCOPIC       Imaging:    No results found.    EKG: Was in normal sinus rhythm on my interpretation.    ED Course   Patient was hydrated carefully normal saline and oral fluids.  His symptoms completely resolved.  Work-up was done as noted.  He had no further complaints and wished to go home.    ER Medications Given:  Medications   sodium chloride (NS) flush 5-10 mL (10 mL IntraVENous Given 09/24/20 1259)   sodium chloride 0.9 % bolus infusion 500 mL (0 mL IntraVENous IV Completed 09/24/20 1348)       Medical Decision Making   Patient with generalized weakness and dehydration and by his description doing well here.  Will screen for electrolyte abnormalities and thyroid problems.  He did not have any testing was also required him to stay in hospital longer.  He was placed follow-up with his family physician.  Final Diagnosis       ICD-10-CM ICD-9-CM   1. Dehydration  E86.0 276.51   2. Weakness  R53.1 780.79       Disposition   Home    Lennox Laity, MD  September 24, 2020    My signature above authenticates this document and my orders, the final    diagnosis (es), discharge prescription (s), and instructions in the Epic     record.  If you have any questions please contact [phone].     Nursing notes have been reviewed by the physician/ advanced practice    Clinician.  Dragon medical dictation software was used for portions of this report. Unintended voice recognition errors may occur.

## 2020-09-25 ENCOUNTER — Inpatient Hospital Stay
Admit: 2020-09-25 | Discharge: 2020-09-25 | Disposition: A | Discharge status: Home / Self Care | Payer: MEDICARE | Attending: Emergency Medicine

## 2020-09-25 ENCOUNTER — Emergency Department: Admit: 2020-09-25 | Payer: MEDICARE | Primary: Family Medicine

## 2020-09-25 DIAGNOSIS — R5383 Other fatigue: Secondary | ICD-10-CM

## 2020-09-25 LAB — TROPONIN-HIGH SENSITIVITY: Troponin-High Sensitivity: 29 ng/L (ref 0–59)

## 2020-09-25 LAB — CBC WITH AUTOMATED DIFF
BASOPHILS: 0.1 % (ref 0–3)
EOSINOPHILS: 0.5 % (ref 0–5)
HCT: 46.2 % (ref 37.0–50.0)
HGB: 15.1 gm/dl (ref 12.4–17.2)
IMMATURE GRANULOCYTES: 0.3 % (ref 0.0–3.0)
LYMPHOCYTES: 20.5 % — ABNORMAL LOW (ref 28–48)
MCH: 30.1 pg (ref 23.0–34.6)
MCHC: 32.7 gm/dl (ref 30.0–36.0)
MCV: 92.2 fL (ref 80.0–98.0)
MONOCYTES: 6.4 % (ref 1–13)
MPV: 11 fL — ABNORMAL HIGH (ref 6.0–10.0)
NEUTROPHILS: 72.2 % — ABNORMAL HIGH (ref 34–64)
NRBC: 0 (ref 0–0)
PLATELET: 183 10*3/uL (ref 140–450)
RBC: 5.01 M/uL (ref 3.80–5.70)
RDW-SD: 47.3 — ABNORMAL HIGH (ref 35.1–43.9)
WBC: 7.4 10*3/uL (ref 4.0–11.0)

## 2020-09-25 LAB — EKG, 12 LEAD, INITIAL
Atrial Rate: 67 {beats}/min
Calculated P Axis: -19 degrees
Calculated R Axis: -28 degrees
Calculated T Axis: 3 degrees
Diagnosis: NORMAL
P-R Interval: 170 ms
Q-T Interval: 396 ms
QRS Duration: 90 ms
QTC Calculation (Bezet): 418 ms
Ventricular Rate: 67 {beats}/min

## 2020-09-25 LAB — METABOLIC PANEL, COMPREHENSIVE
ALT (SGPT): 36 U/L (ref 10–49)
AST (SGOT): 58 U/L — ABNORMAL HIGH (ref 0.0–33.9)
Albumin: 4.2 gm/dl (ref 3.4–5.0)
Alk. phosphatase: 125 U/L — ABNORMAL HIGH (ref 46–116)
Anion gap: 14 mmol/L (ref 5–15)
BUN: 18 mg/dl (ref 9–23)
Bilirubin, total: 0.7 mg/dl (ref 0.30–1.20)
CO2: 23 mEq/L (ref 20–31)
Calcium: 9.5 mg/dl (ref 8.7–10.4)
Chloride: 104 mEq/L (ref 98–107)
Creatinine: 1.23 mg/dl (ref 0.70–1.30)
GFR est AA: >60
GFR est non-AA: >60
Glucose: 89 mg/dl (ref 74–106)
Potassium: 4.8 mEq/L — ABNORMAL HIGH (ref 3.4–4.5)
Protein, total: 8 gm/dl (ref 5.7–8.2)
Sodium: 141 mEq/L (ref 136–145)

## 2020-09-25 LAB — POC URINE MACROSCOPIC
Blood, Urine: NEGATIVE
Blood: NEGATIVE
Glucose, Ur: NEGATIVE mg/dl
Glucose: NEGATIVE mg/dl
Ketone: 40 mg/dl — AB
Ketones, Urine: 40 mg/dl — AB
Leukocyte Esterase, Urine: NEGATIVE
Leukocyte Esterase: NEGATIVE
Nitrite, Urine: NEGATIVE
Nitrites: NEGATIVE
Protein, UA: 30 mg/dl — AB
Protein: 30 mg/dl — AB
Specific Gravity, UA: 1.025 (ref 1.005–1.030)
Specific gravity: 1.025 (ref 1.005–1.030)
Urobilinogen, UA, POCT: 0.2 EU/dl (ref 0.0–1.0)
Urobilinogen: 0.2 EU/dl (ref 0.0–1.0)
pH (UA): 6 (ref 5–9)
pH, UA: 6 (ref 5–9)

## 2020-09-25 LAB — LIPASE
Lipase: 21 U/L (ref 12–53)
Lipase: 21 U/L (ref 12–53)

## 2020-09-25 LAB — MAGNESIUM
Magnesium: 2.1 mg/dL (ref 1.6–2.6)
Magnesium: 2.1 mg/dL (ref 1.6–2.6)

## 2020-09-25 LAB — COMPREHENSIVE METABOLIC PANEL
ALT: 36 U/L (ref 10–49)
AST: 58 U/L — ABNORMAL HIGH (ref 0.0–33.9)
Albumin: 4.2 gm/dl (ref 3.4–5.0)
Alkaline Phosphatase: 125 U/L — ABNORMAL HIGH (ref 46–116)
Anion Gap: 14 mmol/L (ref 5–15)
BUN: 18 mg/dl (ref 9–23)
CO2: 23 mEq/L (ref 20–31)
Calcium: 9.5 mg/dl (ref 8.7–10.4)
Chloride: 104 mEq/L (ref 98–107)
Creatinine: 1.23 mg/dl (ref 0.70–1.30)
EGFR IF NonAfrican American: >60
GFR African American: >60
Glucose: 89 mg/dl (ref 74–106)
Potassium: 4.8 mEq/L — ABNORMAL HIGH (ref 3.4–4.5)
Sodium: 141 mEq/L (ref 136–145)
Total Bilirubin: 0.7 mg/dl (ref 0.30–1.20)
Total Protein: 8 gm/dl (ref 5.7–8.2)

## 2020-09-25 LAB — EKG 12-LEAD
Atrial Rate: 67 {beats}/min
Diagnosis: NORMAL
P Axis: -19 degrees
P-R Interval: 170 ms
Q-T Interval: 396 ms
QRS Duration: 90 ms
QTc Calculation (Bazett): 418 ms
R Axis: -28 degrees
T Axis: 3 degrees
Ventricular Rate: 67 {beats}/min

## 2020-09-25 LAB — CBC WITH AUTO DIFFERENTIAL
Basophils %: 0.1 % (ref 0–3)
Eosinophils %: 0.5 % (ref 0–5)
Hematocrit: 46.2 % (ref 37.0–50.0)
Hemoglobin: 15.1 gm/dl (ref 12.4–17.2)
Immature Granulocytes: 0.3 % (ref 0.0–3.0)
Lymphocytes %: 20.5 % — ABNORMAL LOW (ref 28–48)
MCH: 30.1 pg (ref 23.0–34.6)
MCHC: 32.7 gm/dl (ref 30.0–36.0)
MCV: 92.2 fL (ref 80.0–98.0)
MPV: 11 fL — ABNORMAL HIGH (ref 6.0–10.0)
Monocytes %: 6.4 % (ref 1–13)
Neutrophils %: 72.2 % — ABNORMAL HIGH (ref 34–64)
Nucleated RBCs: 0 (ref 0–0)
Platelets: 183 10*3/uL (ref 140–450)
RBC: 5.01 M/uL (ref 3.80–5.70)
RDW-SD: 47.3 — ABNORMAL HIGH (ref 35.1–43.9)
WBC: 7.4 10*3/uL (ref 4.0–11.0)

## 2020-09-25 LAB — TROPONIN, HIGH SENSITIVITY: Troponin, High Sensitivity: 29 ng/L (ref 0–59)

## 2020-09-25 MED ORDER — ONDANSETRON (PF) 4 MG/2 ML INJECTION
4 mg/2 mL | Freq: Once | INTRAMUSCULAR | Status: AC
Start: 2020-09-25 — End: 2020-09-25
  Administered 2020-09-25: 19:00:00 via INTRAVENOUS

## 2020-09-25 MED ORDER — SODIUM CHLORIDE 0.9% BOLUS IV
0.9 % | Freq: Once | INTRAVENOUS | Status: AC
Start: 2020-09-25 — End: 2020-09-25
  Administered 2020-09-25: 19:00:00 via INTRAVENOUS

## 2020-09-25 MED ORDER — SODIUM CHLORIDE 0.9% BOLUS IV
0.9 % | INTRAVENOUS | Status: AC
Start: 2020-09-25 — End: 2020-09-25
  Administered 2020-09-25: 21:00:00 via INTRAVENOUS

## 2020-09-25 MED ORDER — ONDANSETRON HCL 4 MG TAB
4 mg | ORAL_TABLET | Freq: Three times a day (TID) | ORAL | 0 refills | Status: AC | PRN
Start: 2020-09-25 — End: 2021-03-29

## 2020-09-25 MED FILL — ONDANSETRON (PF) 4 MG/2 ML INJECTION: 4 mg/2 mL | INTRAMUSCULAR | Qty: 2

## 2020-09-25 NOTE — ED Notes (Signed)
Here last night to get fluids,   Had follow up with PCP and says it wasn't enough

## 2020-09-25 NOTE — ED Notes (Signed)
Went over discharge instructions with patient.  Patient given opportunity to ask questions.  Patient verbalized understanding with all instructions.

## 2020-09-25 NOTE — ED Notes (Signed)
Pt off the floor to x-ray

## 2020-09-25 NOTE — ED Provider Notes (Signed)
ED Provider Notes by Eliseo Gum, PA at 09/25/20 1410                Author: Eliseo Gum, [place]  Service: Emergency Medicine  Author Type: Physician Assistant       Filed: 09/25/20 1759  Date of Service: 09/25/20 1410  Status: Attested           Editor: Ione [place], Delorse Limber, [place] (Physician Assistant)  Cosigner: Leonides Cave, DO at 09/26/20 1243          Attestation signed by Leonides Cave, DO at 09/26/20 1243          Supervising Physician [place], Leonides Cave, DO , have fully participated in the care of this patient with the advanced practice provider.  This is a 75 y.o. male with history and exam consistent with fatigue and nausea.      I provided a substantive portion of the care of this patient.  I personally performed the medical decision making, in its entirety, for this encounter.  Initial considerations in this patient included various etiologies of chronic fatigue including infectious  etiology such as urinary tract infection, pneumonia, issues related to his previous pancreatic cancer and Whipple procedure, short-bowel syndrome, viral, bacterial, and protozoal etiologies of gastroenteritis, and other metabolic and infectious etiologies  among others.      I have fully participated in the decision making regarding treatment and disposition of this patient.      I provided a substantive portion of the care of this patient.  I personally provided more than half of the total time dedicated to treatment of this patient.         Leonides Cave, DO   September 26, 2020                                 [facility]   Emergency Department Treatment Report          Patient: Tommy Brown  Age: 75 y.o.  Sex: male          Date of Birth: May 24, 1945  Admit Date: 09/25/2020  PCP: Daralene Milch, MD     MRN: [id number]   CSN: [id number]   Attending: Serita Sheller, MD         Room: [id number]  Time Dictated: 2:10 PM  APP:  Harless Nakayama, PA-C         Chief Complaint      Chief Complaint       Patient presents with        ?  Dehydration               History of Present Illness     75 y.o. male with history of chronic kidney disease, previous pancreatic cancer with Whipple procedure, BPH, coronary artery disease status post CABG who presents emergency department  complaining of weakness and nausea.  He was seen in the emergency department yesterday stating that he felt dehydrated and needed IV fluids.  He was given 500 cc of IV fluids.  He went to see his PCP today who sent him to the emergency department for  IV fluids.  Patient states that he has had a history of similar symptoms in the past with dehydration in December 2021.  He states that he was recently on oral antibiotics for diverticulitis.  Patient reports that he does drink  at least 4 bottles of liquid  daily.  He does take Lasix daily with good urine output.        Review of Systems     Constitutional: No fever, chills. + weakness.    Eyes: No blurred vision.   ENT: No sore throat, congestion or ear pain.   Respiratory: No cough, shortness of breath    Cardiovascular: No chest pain/pressure    Gastrointestinal: +nausea,  No abdominal pain. No vomiting, diarrhea. No melena, hematochezia, or hematemesis.   Genitourinary: No painful urination, frequency, or urgency.   Musculoskeletal: No joint pain or swelling.   Integumentary: No rashes.   Neurological: No headache or dizziness.          Past Medical/Surgical History          Past Medical History:        Diagnosis  Date         ?  Arthritis       ?  Benign prostate hyperplasia       ?  Bladder infection       ?  Chronic kidney disease       ?  Coronary arteriosclerosis       ?  Coronary atherosclerosis of artery bypass graft       ?  Decreased testosterone level       ?  Diverticular disease       ?  Essential hypertension       ?  Gout       ?  History of acute renal failure       ?  History of anemia       ?  History of malignant neoplasm of pancreas        ?  Hx of CABG       ?  Kidney stone       ?  Kidney stones       ?  Neuropathy            NECK, SHOULDERS         ?  Pancreatic cancer ([place])           ?  Sepsis [facility])            Past Surgical History:         Procedure  Laterality  Date          ?  COLONOSCOPY  N/A  07/10/2020          DIAGNOSTIC COLONOSCOPY polypectomy w/hot snare, cold snare , Clip x 1  performed by Dante Gang, MD at [facility] ENDOSCOPY          ?  HX CERVICAL FUSION         ?  HX COLONOSCOPY         ?  HX CORONARY ARTERY BYPASS GRAFT    1995     ?  HX HEART CATHETERIZATION    2006     ?  HX LUMBAR DISKECTOMY         ?  HX OTHER SURGICAL    01/01/2011          whipple procedure done for pancreatic cancer          ?  HX UROLOGICAL    2012          Percutaneous extraction of a kidney stone w/ fragmentation procedure           ?  PR CABG, ARTERY-VEIN, FOUR  1995              Social History          Social History          Socioeconomic History         ?  Marital status:  MARRIED       Tobacco Use         ?  Smoking status:  Never     ?  Smokeless tobacco:  Never       Vaping Use         ?  Vaping Use:  Never used       Substance and Sexual Activity         ?  Alcohol use:  Not Currently     ?  Drug use:  Not Currently     ?  Sexual activity:  Not Currently       Social History Narrative          ** Merged History Encounter **                      Family History          Family History         Problem  Relation  Age of Onset          ?  Alcohol abuse  Father       ?  Heart Disease  Brother            ?  Emphysema  Brother             Home Medications          Prior to Admission Medications     Prescriptions  Last Dose  Informant  Patient Reported?  Taking?      allopurinol (ZYLOPRIM) 300 mg tablet      Yes  No      Sig: Take  by mouth daily.      amLODIPine (NORVASC) 5 mg tablet      Yes  No      Sig: Take 5 mg by mouth daily.      aspirin 81 mg chewable tablet      Yes  No      Sig: Take 81 mg by mouth daily.      atorvastatin (LIPITOR) 20 mg  tablet      Yes  No      Sig: TAKE 1 TABLET BY MOUTH EVERY DAY      bimatoprost (Lumigan) 0.01 % ophthalmic drops      Yes  No      Sig: Administer 1 Drop to both eyes nightly.      furosemide (LASIX) 20 mg tablet      Yes  No      Sig: daily.      gabapentin (NEURONTIN) 600 mg tablet      Yes  No      Sig: Take 600 mg by mouth two (2) times a day.      lipase-protease-amylase (Creon) 24,000-76,000 -120,000 unit capsule      Yes  No      Sig: Take 2 Capsules by mouth four (4) times daily (with meals).      metoprolol succinate (TOPROL-XL) 25 mg XL tablet      Yes  No      Sig: TAKE 1 TABLET BY MOUTH EVERY DAY      naproxen (NAPROSYN) 500  mg tablet      Yes  No      Sig: TAKE 1 TABLET BY MOUTH TWICE A DAY WITH FOOD      ondansetron (ZOFRAN ODT) 4 mg disintegrating tablet      No  No      Sig: Take 1 Tablet by mouth every eight (8) hours as needed for Nausea.      peg 400-propylene glycol (Systane, propylene glycol,) 0.4-0.3 % drop      Yes  No      Sig: Administer 2 Drops to both eyes daily. Patient places two drops in both eyes every AM.   Indications: dry eye      promethazine (PHENERGAN) 12.5 mg tablet      Yes  No      Sig: promethazine 12.5 mg tablet    TAKE 1 TABLET BY MOUTH 2 TIMES PER DAY AS NEEDED      Patient not taking: Reported on 09/08/2019      promethazine (PHENERGAN) 25 mg tablet      No  No      Sig: Take 1 Tablet by mouth every six (6) hours as needed for Nausea.      tamsulosin (Flomax) 0.4 mg capsule      No  No      Sig: Take 2 Caps by mouth daily.      temazepam (RESTORIL) 7.5 mg capsule      Yes  No      Sig: Take  by mouth nightly.      testosterone cypionate (DEPOTESTOTERONE CYPIONATE) 200 mg/mL injection      Yes  No      Sig: INJECT 0.5 MILLILITER INTO THE MUSCLE EVERY 2 WEEKS DISCARD VIAL AFTER USE      tiZANidine (ZANAFLEX) 4 mg tablet      Yes  No      Sig: tizanidine 4 mg tablet    TAKE 1 TABLET BY MOUTH EACH DAY AT BEDTIME AS NEEDED      Patient not taking: Reported on 09/08/2019       triamcinolone acetonide (KENALOG) 0.1 % topical cream      Yes  No      Sig: triamcinolone acetonide 0.1 % topical cream               Facility-Administered Medications: None              Allergies          Allergies        Allergen  Reactions         ?  Other Food  Other (comments)             VISTAID   ANESTHESIA         ?  Codeine  Itching and Swelling     ?  Oxycodone  Anaphylaxis     ?  Hydroxyzine Pamoate  Itching and Other (comments)             Other reaction(s): Unknown            ?  Meperidine  Itching, Palpitations, Swelling and Other (comments)             Other reaction(s): Unknown            ?  Metoclopramide  Itching, Other (comments) and Swelling             Other reaction(s): Other (See Comments)   Reaction unknown  Reaction unknown             ?  Morphine  Other (comments) and Nausea and Vomiting     ?  Oxycodone-Acetaminophen  Itching     ?  Prochlorperazine  Itching, Other (comments) and Unknown (comments)             Other reaction(s): Unknown   Unsure of reaction            ?  Hydroxyzine Hcl  Hives             Physical Exam          ED Triage Vitals     Enc Vitals Group            BP  09/25/20 1250  (!) 157/91        Pulse (Heart Rate)  09/25/20 1250  82        Resp Rate  09/25/20 1250  18        Temp  09/25/20 1250  99 ??F (37.2 ??C)        Temp src  --          O2 Sat (%)  09/25/20 1250  98 %        Weight  09/25/20 1326  132 lb 15 oz        Height  09/25/20 1326  5' 6"         Head Circumference  --          Peak Flow  --          Pain Score  --          Pain Loc  --          Pain Edu?  --              Excl. in [place]?  --          General: Patient appears well developed and well nourished.    HEENT: Conjunctiva clear. PERRL. Mucous membranes moist, non-erythematous.    Neck: Supple, symmetrical. No masses noted.    Respiratory: Lungs clear to auscultation, nonlabored respirations. No wheezes, rhonchi, or rales.   Cardiovascular: Heart regular rate and rhythm without murmur, rubs or gallops.     Gastrointestinal: Abdomen soft nondistended.  No abdominal tenderness palpation.  No rebound or guarding.  No CVA tenderness to tap.   Musculoskeletal: No peripheral edema.   Integumentary: Warm and dry without rashes or lesions.   Neurologic: Alert and oriented, moving all extremities.  No facial asymmetry or dysarthria noted.     Impression and Management Plan     Patient is well-appearing, without pain.  Will repeat basic labs that were done yesterday for comparison and obtain chest x-rays .     Diagnostic Studies     Lab:      Recent Results (from the past 12 hour(s))     CBC WITH AUTOMATED DIFF          Collection Time: 09/25/20  3:00 PM         Result  Value  Ref Range            WBC  7.4  4.0 - 11.0 1000/mm3            RBC  5.01  3.80 - 5.70 M/uL       HGB  15.1  12.4 - 17.2 gm/dl       HCT  46.2  37.0 - 50.0 %  MCV  92.2  80.0 - 98.0 fL       MCH  30.1  23.0 - 34.6 pg       MCHC  32.7  30.0 - 36.0 gm/dl       PLATELET  183  140 - 450 1000/mm3       MPV  11.0 (H)  6.0 - 10.0 fL       RDW-SD  47.3 (H)  35.1 - 43.9         NRBC  0  0 - 0         IMMATURE GRANULOCYTES  0.3  0.0 - 3.0 %       NEUTROPHILS  72.2 (H)  34 - 64 %       LYMPHOCYTES  20.5 (L)  28 - 48 %       MONOCYTES  6.4  1 - 13 %       EOSINOPHILS  0.5  0 - 5 %       BASOPHILS  0.1  0 - 3 %       METABOLIC PANEL, COMPREHENSIVE          Collection Time: 09/25/20  3:00 PM         Result  Value  Ref Range            Potassium  4.8 (H)  3.4 - 4.5 mEq/L       Chloride  104  98 - 107 mEq/L       Sodium  141  136 - 145 mEq/L       CO2  23  20 - 31 mEq/L       Glucose  89  74 - 106 mg/dl       BUN  18  9 - 23 mg/dl       Creatinine  1.23  0.70 - 1.30 mg/dl       GFR est AA  >60          GFR est non-AA  >60          Calcium  9.5  8.7 - 10.4 mg/dl       Anion gap  14  5 - 15 mmol/L       AST (SGOT)  58.0 (H)  0.0 - 33.9 U/L       ALT (SGPT)  36  10 - 49 U/L       Alk. phosphatase  125 (H)  46 - 116 U/L       Bilirubin, total  0.70  0.30 - 1.20 mg/dl        Protein, total  8.0  5.7 - 8.2 gm/dl            Albumin  4.2  3.4 - 5.0 gm/dl       LIPASE          Collection Time: 09/25/20  3:00 PM         Result  Value  Ref Range            Lipase  21  12 - 53 U/L       MAGNESIUM          Collection Time: 09/25/20  3:00 PM         Result  Value  Ref Range            Magnesium  2.1  1.6 - 2.6 mg/dL       TROPONIN-HIGH SENSITIVITY  Collection Time: 09/25/20  3:00 PM         Result  Value  Ref Range            Troponin-High Sensitivity  29  0 - 59 ng/L       POC URINE MACROSCOPIC          Collection Time: 09/25/20  3:15 PM         Result  Value  Ref Range            Glucose  Negative  NEGATIVE,Negative mg/dl       Bilirubin  Small (A)  NEGATIVE,Negative         Ketone  40 (A)  NEGATIVE,Negative mg/dl       Specific gravity  1.025  1.005 - 1.030         Blood  Negative  NEGATIVE,Negative         pH (UA)  6.0  5 - 9         Protein  30 (A)  NEGATIVE,Negative mg/dl       Urobilinogen  0.2  0.0 - 1.0 EU/dl       Nitrites  Negative  NEGATIVE,Negative         Leukocyte Esterase  Negative  NEGATIVE,Negative         Color  Other               Appearance  Clear                 EKG: Sinus rhythm ventricular rate of 71 beats minute, PR interval 168 ms, QTC 449 ms with incomplete right bundle branch block left axis deviation, no acute ST elevation or depression Interpreted by myself and ED attending       Imaging:     CXR:   Interpreted by myself and ED attending            No results found.        ED Course/ Medical Decision Making     Labs are largely unremarkable.  Patient did not develop further symptoms.  Vital signs stable.         Patient has been seen by ED attending with clear lungs on examination no signs of volume overload no evidence of lower extremity edema we will administer 1 L of IV fluids.      Upon reassessment, patient states that he does not feel well as he did in the past when he received 2 L of IV fluids and is requesting a second liter.  We discussed with  patient that given his Lasix, heart disease in the past would recommend rest 1 L  but he would prefer second.  He is appearing euvolemic, repeat lung exams are clear with no evidence of volume overload.  Will administer second liter and then discharge patient home to follow-up with PCP and return if new or worsening symptoms.               Medications       sodium chloride 0.9 % bolus infusion 1,000 mL (0 mL IntraVENous IV Completed 09/25/20 1612)     ondansetron (ZOFRAN) injection 4 mg (4 mg IntraVENous Given 09/25/20 1506)       sodium chloride 0.9 % bolus infusion 1,000 mL (0 mL IntraVENous IV Completed 09/25/20 1740)             Final Diagnosis  ICD-10-CM  ICD-9-CM          1.  Fatigue, unspecified type   R53.83  780.79          2.  Nausea without vomiting   R11.0  787.02             Disposition     Discharge to home        Discharge Medication List as of 09/25/2020  4:54 PM                 START taking these medications          Details        ondansetron hcl (Zofran) 4 mg tablet  Take 1 Tablet by mouth every eight (8) hours as needed for Nausea for up to 15 doses., Normal, Disp-12 Tablet, R-0                        CONTINUE these medications which have NOT CHANGED          Details        !! promethazine (PHENERGAN) 25 mg tablet  Take 1 Tablet by mouth every six (6) hours as needed for Nausea., Normal, Disp-15 Tablet, R-0               furosemide (LASIX) 20 mg tablet  daily., Historical Med               ondansetron (ZOFRAN ODT) 4 mg disintegrating tablet  Take 1 Tablet by mouth every eight (8) hours as needed for Nausea., Normal, Disp-20 Tablet, R-0               atorvastatin (LIPITOR) 20 mg tablet  TAKE 1 TABLET BY MOUTH EVERY DAY, Historical Med               naproxen (NAPROSYN) 500 mg tablet  TAKE 1 TABLET BY MOUTH TWICE A DAY WITH FOOD, Historical Med               tiZANidine (ZANAFLEX) 4 mg tablet  tizanidine 4 mg tablet    TAKE 1 TABLET BY MOUTH EACH DAY AT BEDTIME AS NEEDED, Historical Med                triamcinolone acetonide (KENALOG) 0.1 % topical cream  triamcinolone acetonide 0.1 % topical cream, Historical Med               bimatoprost (Lumigan) 0.01 % ophthalmic drops  Administer 1 Drop to both eyes nightly., Historical Med               !! promethazine (PHENERGAN) 12.5 mg tablet  promethazine 12.5 mg tablet    TAKE 1 TABLET BY MOUTH 2 TIMES PER DAY AS NEEDED, Historical Med               testosterone cypionate (DEPOTESTOTERONE CYPIONATE) 200 mg/mL injection  INJECT 0.5 MILLILITER INTO THE MUSCLE EVERY 2 WEEKS DISCARD VIAL AFTER USE, Historical Med               metoprolol succinate (TOPROL-XL) 25 mg XL tablet  TAKE 1 TABLET BY MOUTH EVERY DAY, Historical Med               lipase-protease-amylase (Creon) 24,000-76,000 -120,000 unit capsule  Take 2 Capsules by mouth four (4) times daily (with meals)., Historical Med               temazepam (RESTORIL) 7.5 mg capsule  Take  by mouth nightly., Historical Med               tamsulosin (Flomax) 0.4 mg capsule  Take 2 Caps by mouth daily., Normal, Disp-60 Cap, R-1               peg 400-propylene glycol (Systane, propylene glycol,) 0.4-0.3 % drop  Administer 2 Drops to both eyes daily. Patient places two drops in both eyes every AM.   Indications: dry eye, Historical Med               gabapentin (NEURONTIN) 600 mg tablet  Take 600 mg by mouth two (2) times a day., Historical Med               amLODIPine (NORVASC) 5 mg tablet  Take 5 mg by mouth daily., Historical Med               allopurinol (ZYLOPRIM) 300 mg tablet  Take  by mouth daily., Historical Med               aspirin 81 mg chewable tablet  Take 81 mg by mouth daily., Historical Med               !! - Potential duplicate medications found. Please discuss with provider.                         The patient was personally evaluated by myself and Dr. Thurnell Garbe who agrees with the above assessment and plan      Harless Nakayama, PA-C   September 25, 2020      My signature above authenticates this document and my  orders, the final     diagnosis (es), discharge prescription (s), and instructions in the Epic     record.   If you have any questions please contact [phone].       Nursing notes have been reviewed by the physician/ advanced practice     Clinician.

## 2020-09-25 NOTE — ED Notes (Signed)
Pt is finishing fluids. I went over discharge papers with pt. As soon as pt is finished with fluids I will fully discharge pt.

## 2020-09-26 LAB — EKG, 12 LEAD, INITIAL
Atrial Rate: 71 {beats}/min
Calculated P Axis: -5 degrees
Calculated R Axis: -31 degrees
Calculated T Axis: 51 degrees
Diagnosis: NORMAL
P-R Interval: 168 ms
Q-T Interval: 414 ms
QRS Duration: 96 ms
QTC Calculation (Bezet): 449 ms
Ventricular Rate: 71 {beats}/min

## 2020-09-26 LAB — EKG 12-LEAD
Atrial Rate: 71 {beats}/min
Diagnosis: NORMAL
P Axis: -5 degrees
P-R Interval: 168 ms
Q-T Interval: 414 ms
QRS Duration: 96 ms
QTc Calculation (Bazett): 449 ms
R Axis: -31 degrees
T Axis: 51 degrees
Ventricular Rate: 71 {beats}/min

## 2020-10-06 ENCOUNTER — Encounter: Primary: Family Medicine

## 2020-12-12 ENCOUNTER — Encounter: Primary: Family Medicine

## 2021-03-22 ENCOUNTER — Inpatient Hospital Stay
Admit: 2021-03-22 | Discharge: 2021-03-22 | Disposition: A | Discharge status: Home / Self Care | Payer: MEDICARE | Attending: Emergency Medicine

## 2021-03-22 ENCOUNTER — Emergency Department: Admit: 2021-03-22 | Payer: MEDICARE | Primary: Family Medicine

## 2021-03-22 ENCOUNTER — Encounter

## 2021-03-22 DIAGNOSIS — M25551 Pain in right hip: Secondary | ICD-10-CM

## 2021-03-22 MED ORDER — PREDNISONE 20 MG TAB
20 mg | ORAL | Status: AC
Start: 2021-03-22 — End: 2021-03-22
  Administered 2021-03-22: 14:00:00 via ORAL

## 2021-03-22 MED ORDER — METHOCARBAMOL 500 MG TAB
500 mg | ORAL_TABLET | Freq: Four times a day (QID) | ORAL | 0 refills | Status: DC | PRN
Start: 2021-03-22 — End: 2021-03-22

## 2021-03-22 MED ORDER — METHOCARBAMOL 750 MG TAB
750 mg | ORAL | Status: AC
Start: 2021-03-22 — End: 2021-03-22
  Administered 2021-03-22: 14:00:00 via ORAL

## 2021-03-22 MED ORDER — ACETAMINOPHEN 325 MG TABLET
325 mg | ORAL | Status: AC
Start: 2021-03-22 — End: 2021-03-22
  Administered 2021-03-22: 14:00:00 via ORAL

## 2021-03-22 MED ORDER — METHOCARBAMOL 500 MG TAB
500 mg | ORAL_TABLET | Freq: Four times a day (QID) | ORAL | 0 refills | Status: DC | PRN
Start: 2021-03-22 — End: 2021-03-29

## 2021-03-22 MED ORDER — PREDNISONE 50 MG TAB
50 mg | ORAL_TABLET | Freq: Every day | ORAL | 0 refills | Status: AC
Start: 2021-03-22 — End: 2021-03-27

## 2021-03-22 MED ORDER — PREDNISONE 50 MG TAB
50 mg | ORAL_TABLET | Freq: Every day | ORAL | 0 refills | Status: DC
Start: 2021-03-22 — End: 2021-03-22

## 2021-03-22 MED FILL — TYLENOL 325 MG TABLET: 325 mg | ORAL | Qty: 3

## 2021-03-22 MED FILL — METHOCARBAMOL 750 MG TAB: 750 mg | ORAL | Qty: 1

## 2021-03-22 MED FILL — PREDNISONE 20 MG TAB: 20 mg | ORAL | Qty: 3

## 2021-03-22 NOTE — ED Notes (Signed)
Pt arrives with complaints of hip and back pain x 2 weeks. Denies any fall or injury, just says it started hurting and has been medicating with pain meds he had at home.     Gets worse when ambulating

## 2021-03-22 NOTE — ED Notes (Signed)
Pt comes by ems from home for right hip pain for 1-2 weeks denies known fall or injury.

## 2021-03-22 NOTE — ED Provider Notes (Signed)
ED  Provider Notes by Hollie Salk, [place] at 03/22/21 [phone]                Author: Hollie Salk, PA  Service: Emergency Medicine  Author Type: Physician Assistant       Filed: 03/22/21 1610  Date of Service: 03/22/21 0748  Status: Attested           Editor: Hollie Salk, Lillie (Physician Assistant)  Cosigner: Susa Raring, MD at 03/26/21 [phone]          Attestation signed by Susa Raring, MD at 03/26/21 [phone]          I provided a substantive portion of the care for this patient. I personally performed the Medical Decision Making, in its entirety, for this encounter.  Susa Raring, MD       MDM: 76 year old gentleman with back pain and hip pain, this was concerning and prompted diagnostic evaluation as above which fortunately is reassuring with no evidence of cauda equina syndrome or epidural abscess or fracture or other indication for emergent  intervention.      I was personally available for consultation in the emergency department.  I have reviewed the chart and agree with the documentation recorded by the [facility], including the assessment, treatment plan, and disposition.   Susa Raring, [place]   Emergency Department Treatment Report          Patient: Tommy Brown  Age: 75 y.o.  Sex: male          Date of Birth: 11-19-1945  Admit Date: 03/22/2021  PCP: Daralene Milch, MD     MRN: [id number]   CSN: [id number]   ATTENDING: Susa Raring, MD         Room: ER28/ER28  Time Dictated: 7:48 AM  APP: Hollie Salk, PA-C        Chief Complaint      Chief Complaint       Patient presents with        ?  Fall        ?  Hip Pain             History of Present Illness     76 y.o. male presenting to the emergency department for evaluation of back and hip pain.  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  This morning when he woke up he experienced severe pain in the right low back radiating into the right hip and  down the upper portion of the right leg.  Worse with movement.  He reports that he tried to ambulate to the bathroom and then had issues due to pain contacted EMS and was transported to the ED.  He denies any history of back surgery does report that he  has a history of left-sided sciatica in the remote past.  No medications prior to transport to the ED.  Denies any numbness or weakness of the lower extremities denies any bowel or bladder dysfunction        Review of Systems  Constitutional:  No fever, chills, body aches   ENT: No sore throat or runny nose   Respiratory: No cough, shortness of breath, or wheezing   Cardiovascular: No chest pain or palpitations   Gastrointestinal: No abdominal pain, nausea, vomiting, diarrhea   Genitourinary: No dysuria or frequency.  No incontinence   Musculoskeletal: + Low back pain, right hip pain.   No swelling    Integumentary: No rashes or lesions   Hematologic: No easy bruising/bleeding.   Neurological: No headache or dizziness.        Past Medical/Surgical History          Past Medical History:        Diagnosis  Date         ?  Arthritis       ?  Benign prostate hyperplasia       ?  Bladder infection       ?  Chronic kidney disease       ?  Coronary arteriosclerosis       ?  Coronary atherosclerosis of artery bypass graft       ?  Decreased testosterone level       ?  Diverticular disease       ?  Essential hypertension       ?  Gout       ?  History of acute renal failure       ?  History of anemia       ?  History of malignant neoplasm of pancreas       ?  Hx of CABG       ?  Kidney stone       ?  Kidney stones       ?  Neuropathy            NECK, SHOULDERS         ?  Pancreatic cancer ([place])           ?  Sepsis [facility])            Past Surgical History:         Procedure  Laterality  Date          ?  COLONOSCOPY  N/A  07/10/2020           DIAGNOSTIC COLONOSCOPY polypectomy w/hot snare, cold snare , Clip x 1  performed by Dante Gang, MD at [facility] ENDOSCOPY          ?  HX CERVICAL FUSION         ?  HX COLONOSCOPY         ?  HX CORONARY ARTERY BYPASS GRAFT    1995     ?  HX HEART CATHETERIZATION    2006     ?  HX LUMBAR DISKECTOMY         ?  HX OTHER SURGICAL    01/01/2011          whipple procedure done for pancreatic cancer          ?  HX UROLOGICAL    2012          Percutaneous extraction of a kidney stone w/ fragmentation procedure           ?  PR CORONARY ARTERY BYP W/VEIN & ARTERY GRAFT 4 VEIN    1995             Social History  Social History          Socioeconomic History         ?  Marital status:  MARRIED       Tobacco Use         ?  Smoking status:  Never     ?  Smokeless tobacco:  Never       Vaping Use         ?  Vaping Use:  Never used       Substance and Sexual Activity         ?  Alcohol use:  Not Currently     ?  Drug use:  Not Currently     ?  Sexual activity:  Not Currently       Social History Narrative          ** Merged History Encounter **                        Family History          Family History         Problem  Relation  Age of Onset          ?  Alcohol abuse  Father       ?  Heart Disease  Brother            ?  Emphysema  Brother               Current Medications          Prior to Admission Medications     Prescriptions  Last Dose  Informant  Patient Reported?  Taking?      allopurinol (ZYLOPRIM) 300 mg tablet      Yes  No      Sig: Take  by mouth daily.      amLODIPine (NORVASC) 5 mg tablet      Yes  No      Sig: Take 5 mg by mouth daily.      aspirin 81 mg chewable tablet      Yes  No      Sig: Take 81 mg by mouth daily.      atorvastatin (LIPITOR) 20 mg tablet      Yes  No      Sig: TAKE 1 TABLET BY MOUTH EVERY DAY      bimatoprost (Lumigan) 0.01 % ophthalmic drops      Yes  No      Sig: Administer 1 Drop to both eyes nightly.      furosemide (LASIX) 20 mg tablet      Yes  No      Sig: daily.       gabapentin (NEURONTIN) 600 mg tablet      Yes  No      Sig: Take 600 mg by mouth two (2) times a day.      lipase-protease-amylase (Creon) 24,000-76,000 -120,000 unit capsule      Yes  No      Sig: Take 2 Capsules by mouth four (4) times daily (with meals).      metoprolol succinate (TOPROL-XL) 25 mg XL tablet      Yes  No      Sig: TAKE 1 TABLET BY MOUTH EVERY DAY      naproxen (NAPROSYN) 500 mg tablet      Yes  No      Sig: TAKE 1 TABLET BY MOUTH  TWICE A DAY WITH FOOD      ondansetron (ZOFRAN ODT) 4 mg disintegrating tablet      No  No      Sig: Take 1 Tablet by mouth every eight (8) hours as needed for Nausea.      ondansetron hcl (Zofran) 4 mg tablet      No  No      Sig: Take 1 Tablet by mouth every eight (8) hours as needed for Nausea for up to 15 doses.      peg 400-propylene glycol (Systane, propylene glycol,) 0.4-0.3 % drop      Yes  No      Sig: Administer 2 Drops to both eyes daily. Patient places two drops in both eyes every AM.   Indications: dry eye      promethazine (PHENERGAN) 12.5 mg tablet      Yes  No      Sig: promethazine 12.5 mg tablet    TAKE 1 TABLET BY MOUTH 2 TIMES PER DAY AS NEEDED      Patient not taking: Reported on 09/08/2019      promethazine (PHENERGAN) 25 mg tablet      No  No      Sig: Take 1 Tablet by mouth every six (6) hours as needed for Nausea.      tamsulosin (Flomax) 0.4 mg capsule      No  No      Sig: Take 2 Caps by mouth daily.      temazepam (RESTORIL) 7.5 mg capsule      Yes  No      Sig: Take  by mouth nightly.      testosterone cypionate (DEPOTESTOTERONE CYPIONATE) 200 mg/mL injection      Yes  No      Sig: INJECT 0.5 MILLILITER INTO THE MUSCLE EVERY 2 WEEKS DISCARD VIAL AFTER USE      tiZANidine (ZANAFLEX) 4 mg tablet      Yes  No      Sig: tizanidine 4 mg tablet    TAKE 1 TABLET BY MOUTH EACH DAY AT BEDTIME AS NEEDED      Patient not taking: Reported on 09/08/2019      triamcinolone acetonide (KENALOG) 0.1 % topical cream      Yes  No      Sig: triamcinolone acetonide  0.1 % topical cream               Facility-Administered Medications: None             Allergies          Allergies        Allergen  Reactions         ?  Other Food  Other (comments)             VISTAID   ANESTHESIA         ?  Codeine  Itching and Swelling     ?  Oxycodone  Anaphylaxis     ?  Hydroxyzine Pamoate  Itching and Other (comments)             Other reaction(s): Unknown            ?  Meperidine  Itching, Palpitations, Swelling and Other (comments)             Other reaction(s): Unknown            ?  Metoclopramide  Itching, Other (comments) and Swelling  Other reaction(s): Other (See Comments)   Reaction unknown    Reaction unknown             ?  Morphine  Other (comments) and Nausea and Vomiting     ?  Oxycodone-Acetaminophen  Itching     ?  Prochlorperazine  Itching, Other (comments) and Unknown (comments)             Other reaction(s): Unknown   Unsure of reaction            ?  Hydroxyzine Hcl  Hives             Physical Exam          ED Triage Vitals [03/22/21 0738]     ED Encounter Vitals Group           BP  (!) 155/86        Pulse (Heart Rate)  80        Resp Rate  16        Temp          Temp src          O2 Sat (%)  98 %        Weight             Height          Constitutional: Very pleasant nontoxic-appearing elderly gentleman lying supine on a backboard on EMS stretcher on my initial evaluation appears  very uncomfortable but nontoxic   HEENT: Conjunctiva clear.  Wearing a mask   Respiratory: lungs clear to auscultation, nonlabored respirations. No tachypnea or accessory muscle use.   Cardiovascular: heart regular rate and rhythm without murmur rubs or gallops.      Gastrointestinal:  Soft, non-tender, non-distended, normoactive bowel sounds   Musculoskeletal: Calves soft and non-tender. No peripheral edema or significant varicosities.  There is some tenderness in the right SI region,  also some tenderness over the lateral aspect of the right hip, no tenderness into the groin.  Patient  has difficulty tolerating straight leg raise secondary to pain   Pulses:  radial and DP pulses 2+ and equal bilaterally.    Integumentary: warm and dry, no jaundice, no rashes or lesions   Neurologic: alert and oriented, no focal weakness.  Sensation to light touch preserved and symmetric bilateral lower extremities        Impression and Management Plan     Very pleasant 76 year old male presenting to the emergency department by EMS for evaluation of right-sided low back pain radiating into the right leg, symptoms with onset 2 weeks  ago but worsening today.  On exam, he appears very uncomfortable but nontoxic has normal vital signs, no neurologic deficits of the lower extremities.  Will obtain plain films of the right hip and pelvis, medicate symptomatically   DDx: Including but not limited to sciatica, SI joint dysfunction     Diagnostic Studies     Lab:    No results found for this or any previous visit (from the past 12 hour(s)).   Imaging:     XR HIP RT W OR WO PELV 2-3 VWS      Result Date: 03/22/2021   Exam: XR HIP RT W OR WO PELV 2-3 VWS Indication: injury Additional information: Pain. COMPARISON: None FINDINGS: No fractures or dislocations. Joint spaces are preserved. Small acetabular spurring. Lumbar hardware fusion unremarkable. Suggestion of penile  implant.       IMPRESSION: No acute findings. Electronically  signed by: Cecilio Asper, MD 03/22/2021 9:35 AM EST              Imaging studies independently interpreted by myself and PALMA, Sharion Dove, MD. My interpretation is plain films with no acute findings and the hip or L-spine     ED Course/Medical Decision Making     Plain films are unrevealing.  Patient was given Tylenol, Robaxin, prednisone after which time he reports some relief in his pain.  He was able to move a little easier, stand up and  transfer.  He is very [employer] attorney.  He is ready for discharge home which I feel is reasonable.  I think his pain is likely related to sciatica.  I provided him  with prescriptions for Robaxin and prednisone going forward and advised him to follow-up with  his primary care physician when possible.  He will return with new or worsening symptoms remained stable during time the emergency department and was discharged home in good condition           RECORDS REVIEWED:  I reviewed the patient's previous records here at [facility] and available outside facilities and note that patient has not had any  imaging of his lumbar spine or lower extremities at our facility      INDEPENDENT HISTORIAN:  History and/or plan development assisted by: EMS      Severe exacerbation or progression  of chronic illness:back pain         Threat to body function without evaluation and management:  musculoskeletal         SOCIAL DETERMINANTS  impacting Evaluation and Management: patient works maintenance at [employer], has a physical job requiring frequent twisting/pulling         Comorbidities impacting Evaluation and Management: coronary disease        Medications       acetaminophen (TYLENOL) tablet 975 mg (975 mg Oral Given 03/22/21 0834)     methocarbamoL (ROBAXIN) tablet 750 mg (750 mg Oral Given 03/22/21 0833)       predniSONE (DELTASONE) tablet 60 mg (60 mg Oral Given 03/22/21 0834)          Final Diagnosis                 ICD-10-CM  ICD-9-CM          1.  Acute right hip pain   M25.551  719.45     2.  Acute bilateral low back pain without sciatica   M54.50  724.2             338.19             Disposition     Discharged home.       Hollie Salk, PA-C   March 22, 2021      The patient was personally evaluated by myself and discussed with PALMA, Sharion Dove, MD who agrees with the above assessment and plan.      My signature above authenticates this document and my orders, the final diagnosis (es), discharge prescription (s), and instructions in the Epic record. If you have any questions please contact [phone].       Nursing notes have been reviewed by the physician/ advanced practice clinician.

## 2021-03-22 NOTE — ED Notes (Signed)
10:28 AM  03/22/21     Discharge instructions given to Tommy Brown  (name) with verbalization of understanding. Patient accompanied by patient.  Patient discharged with the following prescriptions   Current Discharge Medication List        START taking these medications    Details   methocarbamoL (ROBAXIN) 500 mg tablet Take 1 Tablet by mouth four (4) times daily as needed for Muscle Spasm(s) or Pain.  Qty: 24 Tablet, Refills: 0  Start date: 03/22/2021      predniSONE (DELTASONE) 50 mg tablet Take 1 Tablet by mouth daily for 5 days.  Qty: 5 Tablet, Refills: 0  Start date: 03/22/2021, End date: 03/27/2021           CONTINUE these medications which have NOT CHANGED    Details   ondansetron hcl (Zofran) 4 mg tablet Take 1 Tablet by mouth every eight (8) hours as needed for Nausea for up to 15 doses.  Qty: 12 Tablet, Refills: 0      promethazine (PHENERGAN) 25 mg tablet Take 1 Tablet by mouth every six (6) hours as needed for Nausea.  Qty: 15 Tablet, Refills: 0      furosemide (LASIX) 20 mg tablet daily.      ondansetron (ZOFRAN ODT) 4 mg disintegrating tablet Take 1 Tablet by mouth every eight (8) hours as needed for Nausea.  Qty: 20 Tablet, Refills: 0      atorvastatin (LIPITOR) 20 mg tablet TAKE 1 TABLET BY MOUTH EVERY DAY      bimatoprost (Lumigan) 0.01 % ophthalmic drops Administer 1 Drop to both eyes nightly.      testosterone cypionate (DEPOTESTOTERONE CYPIONATE) 200 mg/mL injection INJECT 0.5 MILLILITER INTO THE MUSCLE EVERY 2 WEEKS DISCARD VIAL AFTER USE      metoprolol succinate (TOPROL-XL) 25 mg XL tablet TAKE 1 TABLET BY MOUTH EVERY DAY      lipase-protease-amylase (Creon) 24,000-76,000 -120,000 unit capsule Take 2 Capsules by mouth four (4) times daily (with meals).      temazepam (RESTORIL) 7.5 mg capsule Take  by mouth nightly.      tamsulosin (Flomax) 0.4 mg capsule Take 2 Caps by mouth daily.  Qty: 60 Cap, Refills: 1      peg 400-propylene glycol (Systane, propylene glycol,) 0.4-0.3 % drop Administer 2 Drops to  both eyes daily. Patient places two drops in both eyes every AM.   Indications: dry eye      gabapentin (NEURONTIN) 600 mg tablet Take 600 mg by mouth two (2) times a day.      amLODIPine (NORVASC) 5 mg tablet Take 5 mg by mouth daily.      allopurinol (ZYLOPRIM) 300 mg tablet Take  by mouth daily.      aspirin 81 mg chewable tablet Take 81 mg by mouth daily.           STOP taking these medications       naproxen (NAPROSYN) 500 mg tablet Comments:   Reason for Stopping:         tiZANidine (ZANAFLEX) 4 mg tablet Comments:   Reason for Stopping:            . Patient discharged to Home (destination).      Leeroy Bock, RN

## 2021-03-23 ENCOUNTER — Inpatient Hospital Stay: Admit: 2021-03-23 | Payer: MEDICARE | Attending: Family | Primary: Family Medicine

## 2021-03-23 DIAGNOSIS — R1031 Right lower quadrant pain: Secondary | ICD-10-CM

## 2021-03-23 LAB — CREATININE, POC: Creatinine: 1.7 mg/dl — ABNORMAL HIGH (ref 0.6–1.3)

## 2021-03-23 LAB — AMB POC CREATININE: Creatinine: 1.7 mg/dl — ABNORMAL HIGH (ref 0.6–1.3)

## 2021-03-23 MED ORDER — BARIUM SULFATE 2 % ORAL SUSP
2.1 % (w/v), 2.0 % (w/w) | Freq: Once | ORAL | Status: AC
Start: 2021-03-23 — End: 2021-03-23
  Administered 2021-03-23: 18:00:00 via ORAL

## 2021-03-23 MED ORDER — IOPAMIDOL 61 % IV SOLN
300 mg iodine /mL (61 %) | Freq: Once | INTRAVENOUS | Status: AC
Start: 2021-03-23 — End: 2021-03-23
  Administered 2021-03-23: 18:00:00 via INTRAVENOUS

## 2021-03-23 MED FILL — ISOVUE-300  61 % INTRAVENOUS SOLUTION: 300 mg iodine /mL (61 %) | INTRAVENOUS | Qty: 80

## 2021-03-23 MED FILL — BARIUM SULFATE 2 % ORAL SUSP: 2.1 % (w/v), 2.0 % (w/w) | ORAL | Qty: 900

## 2021-03-29 ENCOUNTER — Inpatient Hospital Stay
Admit: 2021-03-29 | Discharge: 2021-03-29 | Disposition: A | Discharge status: Home / Self Care | Payer: MEDICARE | Attending: Emergency Medicine

## 2021-03-29 DIAGNOSIS — R109 Unspecified abdominal pain: Secondary | ICD-10-CM

## 2021-03-29 LAB — POC URINE MACROSCOPIC
Bilirubin, Urine: NEGATIVE
Bilirubin: NEGATIVE
Blood, Urine: NEGATIVE
Blood: NEGATIVE
Glucose, Ur: NEGATIVE mg/dl
Glucose: NEGATIVE mg/dl
Ketone: NEGATIVE mg/dl
Ketones, Urine: NEGATIVE mg/dl
Leukocyte Esterase, Urine: NEGATIVE
Leukocyte Esterase: NEGATIVE
Nitrite, Urine: NEGATIVE
Nitrites: NEGATIVE
Protein, UA: NEGATIVE mg/dl
Protein: NEGATIVE mg/dl
Specific Gravity, UA: <=1.005 (ref 1.005–1.030)
Specific gravity: <=1.005 (ref 1.005–1.030)
Urobilinogen, UA, POCT: 0.2 EU/dl (ref 0.0–1.0)
Urobilinogen: 0.2 EU/dl (ref 0.0–1.0)
pH (UA): 5.5 (ref 5–9)
pH, UA: 5.5 (ref 5–9)

## 2021-03-29 MED ORDER — TRAMADOL 50 MG TAB
50 mg | ORAL_TABLET | Freq: Three times a day (TID) | ORAL | 0 refills | Status: AC | PRN
Start: 2021-03-29 — End: 2021-04-05

## 2021-03-29 MED ORDER — TRAMADOL 50 MG TAB
50 mg | ORAL | Status: AC
Start: 2021-03-29 — End: 2021-03-29
  Administered 2021-03-29: 17:00:00 via ORAL

## 2021-03-29 MED ORDER — LIDOCAINE 4 % TOPICAL PATCH (12 HOUR DURATION)
4 % | MEDICATED_PATCH | Freq: Two times a day (BID) | CUTANEOUS | 0 refills | Status: AC
Start: 2021-03-29 — End: 2021-04-03

## 2021-03-29 MED ORDER — ONDANSETRON 4 MG TAB, RAPID DISSOLVE
4 mg | ORAL_TABLET | Freq: Three times a day (TID) | ORAL | 0 refills | Status: AC | PRN
Start: 2021-03-29 — End: ?

## 2021-03-29 MED ORDER — ACETAMINOPHEN 325 MG TABLET
325 mg | ORAL | Status: AC
Start: 2021-03-29 — End: 2021-03-29
  Administered 2021-03-29: 17:00:00 via ORAL

## 2021-03-29 MED ORDER — METHOCARBAMOL 500 MG TAB
500 mg | ORAL_TABLET | Freq: Four times a day (QID) | ORAL | 0 refills | Status: AC | PRN
Start: 2021-03-29 — End: ?

## 2021-03-29 MED FILL — ACETAMINOPHEN 325 MG TABLET: 325 mg | ORAL | Qty: 2

## 2021-03-29 MED FILL — TRAMADOL 50 MG TAB: 50 mg | ORAL | Qty: 1

## 2021-03-29 NOTE — ED Notes (Signed)
Patient presents to the ED by EMS from home for evaluation of L flank and abdominal pain X 2 weeks. VSS.

## 2021-03-29 NOTE — ED Notes (Signed)
Patient stated understanding of discharge instructions. Patient received two prescription(s) Patient told not to drive with medication. Patient was ambulatory upon discharge. Patient was in stable condition. Patient was accompanies with family member.     Patient armband removed and shredded

## 2021-03-29 NOTE — ED Provider Notes (Signed)
EMERGENCY DEPARTMENT HISTORY AND PHYSICAL EXAM      Date: 03/29/2021  Patient Name: Tommy Brown    History of Presenting Illness     Chief Complaint   Patient presents with    Flank Pain       History Provided By: Patient    Chief Complaint: Left flank pain    Additional History (Context): Tommy Brown is a 76 y.o. male who presents with 2 weeks of left flank pain.  Patient notes that he has a history of recurrent urolithiasis, however this feels slightly different than that.  Pain is centered around the left lower lumbar back near the sacroiliac joint, aching, worse with certain movements and twisting, on and off throughout the day with worsening and improving symptoms but present to some degree almost all the time, moderate at worst and mild at best.  Denies any dysuria, frequency, hesitancy, nausea, vomiting, diarrhea, fevers, chills, sweats.  Called his urologist, who recommended to come to the emergency department for evaluation.  Denies any cough or sputum, rash, testicular pain.    PCP: Daralene Milch, MD    Current Facility-Administered Medications   Medication Dose Route Frequency Provider Last Rate Last Admin    traMADoL (ULTRAM) tablet 50 mg  50 mg Oral NOW Susa Raring, MD        acetaminophen (TYLENOL) tablet 650 mg  650 mg Oral NOW Susa Raring, MD         Current Outpatient Medications   Medication Sig Dispense Refill    traMADoL (ULTRAM) 50 mg tablet Take 1 Tablet by mouth every eight (8) hours as needed for Pain for up to 7 days. Max Daily Amount: 150 mg. 20 Tablet 0    ondansetron (ZOFRAN ODT) 4 mg disintegrating tablet Take 1 Tablet by mouth every eight (8) hours as needed for Nausea or Vomiting. 8 Tablet 0    methocarbamoL (ROBAXIN) 500 mg tablet Take 1 Tablet by mouth four (4) times daily as needed for Muscle Spasm(s). 20 Tablet 0    lidocaine 4 % patch 1 Patch by TransDERmal route every twelve (12) hours for 5 days. 12 hours on, then 12 hours off 10 Patch 0    furosemide  (LASIX) 20 mg tablet daily.      atorvastatin (LIPITOR) 20 mg tablet TAKE 1 TABLET BY MOUTH EVERY DAY      bimatoprost (Lumigan) 0.01 % ophthalmic drops Administer 1 Drop to both eyes nightly.      testosterone cypionate (DEPOTESTOTERONE CYPIONATE) 200 mg/mL injection INJECT 0.5 MILLILITER INTO THE MUSCLE EVERY 2 WEEKS DISCARD VIAL AFTER USE      metoprolol succinate (TOPROL-XL) 25 mg XL tablet TAKE 1 TABLET BY MOUTH EVERY DAY      lipase-protease-amylase (Creon) 24,000-76,000 -120,000 unit capsule Take 2 Capsules by mouth four (4) times daily (with meals).      temazepam (RESTORIL) 7.5 mg capsule Take  by mouth nightly.      tamsulosin (Flomax) 0.4 mg capsule Take 2 Caps by mouth daily. 60 Cap 1    peg 400-propylene glycol (Systane, propylene glycol,) 0.4-0.3 % drop Administer 2 Drops to both eyes daily. Patient places two drops in both eyes every AM.   Indications: dry eye      gabapentin (NEURONTIN) 600 mg tablet Take 600 mg by mouth two (2) times a day.      amLODIPine (NORVASC) 5 mg tablet Take 5 mg by mouth daily.      allopurinol (  ZYLOPRIM) 300 mg tablet Take  by mouth daily.      aspirin 81 mg chewable tablet Take 81 mg by mouth daily.         Past History     Past Medical History:  Past Medical History:   Diagnosis Date    Arthritis     Benign prostate hyperplasia     Bladder infection     Chronic kidney disease     Coronary arteriosclerosis     Coronary atherosclerosis of artery bypass graft     Decreased testosterone level     Diverticular disease     Essential hypertension     Gout     History of acute renal failure     History of anemia     History of malignant neoplasm of pancreas     Hx of CABG     Kidney stone     Kidney stones     Neuropathy     NECK, SHOULDERS    Pancreatic cancer ([place])     Sepsis ([place])        Past Surgical History:  Past Surgical History:   Procedure Laterality Date    COLONOSCOPY N/A 07/10/2020    DIAGNOSTIC COLONOSCOPY polypectomy w/hot snare, cold snare , Clip x 1  performed by  Dante Gang, MD at [facility] ENDOSCOPY    HX CERVICAL FUSION      HX COLONOSCOPY      [facility]    HX HEART CATHETERIZATION  2006    HX LUMBAR DISKECTOMY      HX OTHER SURGICAL  01/01/2011    whipple procedure done for pancreatic cancer    HX UROLOGICAL  2012    Percutaneous extraction of a kidney stone w/ fragmentation procedure     PR CORONARY ARTERY BYP W/VEIN & ARTERY GRAFT 4 VEIN  1995       Family History:  Family History   Problem Relation Age of Onset    Alcohol abuse Father     Heart Disease Brother     Emphysema Brother        Social History:  Social History     Tobacco Use    Smoking status: Never    Smokeless tobacco: Never   Vaping Use    Vaping Use: Never used   Substance Use Topics    Alcohol use: Not Currently    Drug use: Not Currently       Allergies:  Allergies   Allergen Reactions    Other Food Other (comments)     VISTAID  ANESTHESIA    Codeine Itching and Swelling    Oxycodone Anaphylaxis    Hydroxyzine Pamoate Itching and Other (comments)     Other reaction(s): Unknown      Meperidine Itching, Palpitations, Swelling and Other (comments)     Other reaction(s): Unknown      Metoclopramide Itching, Other (comments) and Swelling     Other reaction(s): Other (See Comments)  Reaction unknown   Reaction unknown       Morphine Other (comments) and Nausea and Vomiting    Oxycodone-Acetaminophen Itching    Prochlorperazine Itching, Other (comments) and Unknown (comments)     Other reaction(s): Unknown  Unsure of reaction      Hydroxyzine Hcl Hives         Review of Systems   Review of Systems   Constitutional:  Negative for chills, fatigue and fever.   HENT:  Negative for congestion, rhinorrhea, sore throat and trouble swallowing.    Eyes:  Negative for discharge, redness and itching.   Respiratory:  Negative for cough, shortness of breath, wheezing and stridor.    Cardiovascular:  Negative for chest pain, palpitations and leg swelling.   Gastrointestinal:  Positive for  abdominal pain. Negative for blood in stool, diarrhea, nausea and vomiting.   Genitourinary:  Positive for flank pain. Negative for decreased urine volume, difficulty urinating, dysuria, frequency, hematuria, testicular pain and urgency.   Musculoskeletal:  Positive for back pain and myalgias.   Skin:  Negative for rash.   Neurological:  Negative for syncope and light-headedness.   Psychiatric/Behavioral:  Negative for behavioral problems and confusion.    All other systems reviewed and are negative.    Physical Exam     Vitals:    03/29/21 1128   BP: (!) 150/80   Pulse: 82   Resp: 18   Temp: 98.3 ??F (36.8 ??C)   SpO2: 96%     Physical Exam  Vitals and nursing note reviewed.   Constitutional:       General: He is not in acute distress.     Appearance: He is well-developed and normal weight. He is not ill-appearing.   HENT:      Head: Normocephalic and atraumatic.      Nose: Nose normal.      Mouth/Throat:      Mouth: Mucous membranes are moist.      Pharynx: Oropharynx is clear.   Eyes:      Extraocular Movements: Extraocular movements intact.      Conjunctiva/sclera: Conjunctivae normal.      Pupils: Pupils are equal, round, and reactive to light.   Cardiovascular:      Rate and Rhythm: Normal rate and regular rhythm.      Heart sounds: Normal heart sounds. No murmur heard.  Pulmonary:      Effort: Pulmonary effort is normal.      Breath sounds: Normal breath sounds. No wheezing.   Abdominal:      General: Bowel sounds are normal.      Palpations: Abdomen is soft.      Tenderness: There is no abdominal tenderness. There is no right CVA tenderness, left CVA tenderness, guarding or rebound.   Musculoskeletal:         General: No tenderness. Normal range of motion.      Cervical back: Normal range of motion and neck supple.      Comments: Lumbar spine with no midline or bony tenderness to palpation, does have left sacroiliac joint area discomfort to palpation which reproduces his pain.  Negative straight leg raise  bilaterally.  Normal gait.   Skin:     General: Skin is warm and dry.      Capillary Refill: Capillary refill takes less than 2 seconds.      Findings: No rash.   Neurological:      General: No focal deficit present.      Mental Status: He is alert and oriented to person, place, and time.   Psychiatric:         Behavior: Behavior normal.         Diagnostic Study Results     Labs -     Recent Results (from the past 12 hour(s))   POC URINE MACROSCOPIC    Collection Time: 03/29/21 12:19 PM   Result Value Ref Range    Glucose Negative NEGATIVE,Negative mg/dl    Bilirubin  Negative NEGATIVE,Negative      Ketone Negative NEGATIVE,Negative mg/dl    Specific gravity <=1.005 1.005 - 1.030      Blood Negative NEGATIVE,Negative      pH (UA) 5.5 5 - 9      Protein Negative NEGATIVE,Negative mg/dl    Urobilinogen 0.2 0.0 - 1.0 EU/dl    Nitrites Negative NEGATIVE,Negative      Leukocyte Esterase Negative NEGATIVE,Negative      Color Yellow      Appearance Clear         Radiologic Studies -   No orders to display     CT Results  (Last 48 hours)      None          CXR Results  (Last 48 hours)      None          Bedside US    Date/Time: 03/29/2021 11:55 AM  Performed by: Susa Raring, MD  Authorized by: Susa Raring, MD     Verbal consent obtained: Yes    Given by:  Patient  Performed by:  Attending  Type of procedure:  Focused renal/urinary tract  Indications:  Abdominal pain and flank pain  Right kidney long axis (coronal):  Adequate  Right kidney short axis:  Adequate  Left kidney long axis (coronal):  Adequate  Left kidney short axis:  Adequate  Transverse bladder:  Adequate  Right kidney hydronephrosis:  Absent  Left kidney hydronephrosis:  Absent  Renal interpretation:  No sonographic evidence of renal tract obstruction  Bladder size:  Collapsed      Medical Decision Making   I am the first provider for this patient.    I reviewed the vital signs, available nursing notes, past medical history, past surgical history, family  history and social history.    Vital Signs-Reviewed the patient's vital signs.    Records Reviewed: Prior medical records and Nursing notes    ED Course:      Remained stable during his emergency department stay, felt better after treatment    Other considerations:     Comorbidities impacting Evaluation and Management: History of recurrent urolithiasis in the past makes current urolithiasis likely.  Patient's known coronary disease and other medical comorbidities also complicates his acute illness.    I considered the following testing, treatment, or disposition:   CT scan of abdomen and pelvis, but decided not to pursue due to benign abdominal exam, low suspicion for acute intra-abdominal surgical or infectious process or other acute life threat.    Differential diagnosis includes urolithiasis, pyelonephritis, musculoskeletal back pain, other acute intra-abdominal surgical or infectious processes.    Disposition:  Discharge    DISCHARGE NOTE:     Pt has been reexamined. Patient has no new complaints, changes, or physical findings.  Care plan outlined and precautions discussed.  Results of urinalysis and ultrasound were reviewed with the patient. All medications were reviewed with the patient; will d/c home with symptomatic medications as below. All of pt's questions and concerns were addressed. Patient was instructed and agrees to follow up with primary care, as well as to return to the ED upon further deterioration. Patient is ready to go home.    Follow-up Information       Follow up With Specialties Details Why Contact Info    Deguzman-Berube, Aundra Dubin, MD Family Medicine Call in 1 day  [address] [place]  [address]  [place] [address]  [phone]  [facility] EMERGENCY DEPT Emergency Medicine  As needed [place]  [phone]            Current Discharge Medication List        START taking these medications    Details   traMADoL (ULTRAM) 50 mg tablet Take 1  Tablet by mouth every eight (8) hours as needed for Pain for up to 7 days. Max Daily Amount: 150 mg.  Qty: 20 Tablet, Refills: 0  Start date: 03/29/2021, End date: 04/05/2021    Associated Diagnoses: Acute left flank pain; Acute left-sided low back pain without sciatica; Sacroiliac joint disease      lidocaine 4 % patch 1 Patch by TransDERmal route every twelve (12) hours for 5 days. 12 hours on, then 12 hours off  Qty: 10 Patch, Refills: 0  Start date: 03/29/2021, End date: 04/03/2021           CONTINUE these medications which have CHANGED    Details   ondansetron (ZOFRAN ODT) 4 mg disintegrating tablet Take 1 Tablet by mouth every eight (8) hours as needed for Nausea or Vomiting.  Qty: 8 Tablet, Refills: 0  Start date: 03/29/2021      methocarbamoL (ROBAXIN) 500 mg tablet Take 1 Tablet by mouth four (4) times daily as needed for Muscle Spasm(s).  Qty: 20 Tablet, Refills: 0  Start date: 03/29/2021           CONTINUE these medications which have NOT CHANGED    Details   furosemide (LASIX) 20 mg tablet daily.      atorvastatin (LIPITOR) 20 mg tablet TAKE 1 TABLET BY MOUTH EVERY DAY      bimatoprost (Lumigan) 0.01 % ophthalmic drops Administer 1 Drop to both eyes nightly.      testosterone cypionate (DEPOTESTOTERONE CYPIONATE) 200 mg/mL injection INJECT 0.5 MILLILITER INTO THE MUSCLE EVERY 2 WEEKS DISCARD VIAL AFTER USE      metoprolol succinate (TOPROL-XL) 25 mg XL tablet TAKE 1 TABLET BY MOUTH EVERY DAY      lipase-protease-amylase (Creon) 24,000-76,000 -120,000 unit capsule Take 2 Capsules by mouth four (4) times daily (with meals).      temazepam (RESTORIL) 7.5 mg capsule Take  by mouth nightly.      tamsulosin (Flomax) 0.4 mg capsule Take 2 Caps by mouth daily.  Qty: 60 Cap, Refills: 1      peg 400-propylene glycol (Systane, propylene glycol,) 0.4-0.3 % drop Administer 2 Drops to both eyes daily. Patient places two drops in both eyes every AM.   Indications: dry eye      gabapentin (NEURONTIN) 600 mg tablet Take 600 mg by  mouth two (2) times a day.      amLODIPine (NORVASC) 5 mg tablet Take 5 mg by mouth daily.      allopurinol (ZYLOPRIM) 300 mg tablet Take  by mouth daily.      aspirin 81 mg chewable tablet Take 81 mg by mouth daily.           STOP taking these medications       ondansetron hcl (Zofran) 4 mg tablet Comments:   Reason for Stopping:         promethazine (PHENERGAN) 25 mg tablet Comments:   Reason for Stopping:         triamcinolone acetonide (KENALOG) 0.1 % topical cream Comments:   Reason for Stopping:                 Medical Decision  Making   76 year old gentleman with history of recurrent urolithiasis as well as recurrent back pain, who currently presents with left-sided flank pain which actually is centered around his left sacroiliac joint.  This seems musculoskeletal in nature.  Bedside ultrasound with no hydronephrosis, urinalysis reassuring.  Will treat symptomatically with medications as above, return precautions, outpatient primary care follow-up.      Diagnosis     Clinical Impression:   1. Acute left flank pain    2. Acute left-sided low back pain without sciatica    3. Sacroiliac joint disease          Dragon medical dictation software was used for portions of this report. Unintended errors may occur.

## 2021-03-30 LAB — CULTURE, URINE
CULTURE RESULT: NO GROWTH
Culture Result: NO GROWTH

## 2021-04-30 ENCOUNTER — Encounter: Primary: Family Medicine

## 2021-05-30 ENCOUNTER — Ambulatory Visit: Admit: 2021-05-30 | Discharge: 2021-05-30 | Payer: MEDICARE | Primary: Family Medicine

## 2021-05-30 ENCOUNTER — Encounter: Primary: Family Medicine

## 2021-05-30 DIAGNOSIS — N200 Calculus of kidney: Secondary | ICD-10-CM

## 2021-05-30 LAB — AMB POC URINALYSIS DIP STICK AUTO W/O MICRO
Bilirubin, Urine, POC: NEGATIVE
Blood, Urine, POC: NEGATIVE
Glucose, Urine, POC: NEGATIVE
Ketones, Urine, POC: NEGATIVE
Leukocyte Esterase, Urine, POC: NEGATIVE
Nitrite, Urine, POC: NEGATIVE
Protein, Urine, POC: NEGATIVE
Specific Gravity, Urine, POC: 1.015 (ref 1.001–1.035)
Urobilinogen, POC: 0.2
pH, Urine, POC: 5.5 (ref 4.6–8.0)

## 2021-05-30 NOTE — Progress Notes (Signed)
Dr. Elonda Husky is the supervising physician for this day.    Tommy Brown  DOB: [birth year]  Encounter Date: 05/30/2021       ICD-10-CM    1. Calculus of kidney  N20.0 AMB POC URINALYSIS DIP STICK AUTO W/O MICRO      2. Bilateral renal cysts  N28.1              ASSESSMENT:  - Nephrolithiasis               Surgical History:               S/p URS with LL in 02/2017, for a left 33m proximal ureteral stone with hydroureteronephrosis and 182mLLP stone              S/p left jj stent on 06/22/19 with Dr. KeClaiborne Billings             S/p Cystoscopy, left retrograde pyelogram, left ureteroscopy, holmium laser lithotripsy of proximal ureteral stone and extraction of left renal calculi and left double-J ureteral stent exchange on 07/31/18, string with stent removed on 08/06/18.                  Medical Therapy:  n/a              Stone Composition: calcium oxalate 100% 07/31/18.               24 Hr Urine Panel: 09/27/19: Very low urine volume(1.92), very low urinary citrate (99), very elevated urinary sodium (324), mildly low urinary pH (5.65).       05/01/20: low urine volume (1.27), mild hyperoxaluria (45), extreme hypocitraturia (<19), low urine volume (5.513)                     Imaging:               RUS 01/13/19: Suboptimal exam. No hydronephrosis. Bilateral renal calcifications versus  nonobstructing calculi.               CT A/P 06/22/18: Multiple small (2 to 4 mm) nonobstructing bilateral renal calculi. There is a 10 x 9 mm nonobstructing  calculus in the lower pole of the left kidney. Moderate left hydronephrosis and hydroureter. There is a 8 x 7 mm obstructing calculus in the proximal left ureter. No right hydronephrosis. No suspicious renal lesions.               R [place]/14/2021: Right kidney 9 mm, 6 mm left kidney.  No evidence of hydronephrosis.  Uncomplicated cyst.              CT A/P w/ contrast 10/28/19: KIDNEYS/URETERS: Multiple punctate (1 to 2 mm) bilateral nonobstructing calculi. Numerous bilateral simple cysts for which no  follow-up is needed. No suspicious  masses. No hydronephrosis.  CT Abd Pelv 02/17/20: Nonobstructing 2 to 3 mm left nephrolithiasis.   [facility] nonobstructing intrarenal calculi, left greater than right.   KM read: multiple small, nonobstructive stones bilaterally with largest in left measuring 3 mm     - Bilateral complex renal cysts  CT A/P 03/23/21:  stable bilateral complex renal cysts    - Hypogonadism, Previously managed by Dr.  DeDaralene MilchMD               Last Testosterone level was 567 ng/dL on 03/25/19.  CBC 03/25/19 reviewed with patient.               On TC 0.5 mL injections q 2 weeks as managed by PCP.      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            PLAN:   UA today negative  Reviewed CT from 03/23/21  Reviewed Litholink - low urine volume, elevated oxalates, very low citrate  Continue to increase urine output to greater than 2.5 L daily, follow low oxalate diet  Continue on Allopurinol 300 mg daily   Recommend Moonstone 2 pills BID to increase citrate  Repeat Litholink 6 weeks on Moonstone  Follow-up 8 weeks to review repeat Litholink on Moonstone  Will plan for future CT A/P w/wo to monitor stone burden and assess renal cysts.         DISCUSSION: We discussed dietary methods for stone prevention including the following: increased water intake to 2-3 liters per day, add lemon or lemon concentrate to water or Crystal Lite lemonade to increase citrate which is beneficial for stone prevention, limiting dietary sodium to less than 2300 mg per day, limiting animal protein 8 oz or less daily, and limiting foods high in oxalate content  (spinach, beans, chocolate, etc.).        Chief Complaint   Patient presents with    Nephrolithiasis     Patient states that  this a follow up and he has plenty of kidney stones in his kidneys currently, he states that he passed a stone last week.        HISTORY OF PRESENT ILLNESS:   Tommy Brown is a 76 y.o. male who presents today in follow up for an established diagnosis of kidney stone. Patient presented to [facility] ED on 03/22/21 with concerns of right-sided low back pain radiating to the right leg.  CT scan performed on 03/23/2021 shows stable nonobstructing bilateral intrarenal stones with left greater than right.  Patient was discharged with Robaxin and prednisone for possible sciatic pain.    Patient doing well today. Reports passing a stone last week. Was not having any symptoms, denies flank pain or gross hematuria. Only knew a stone pass because he saw it pass out. Today, denies flank pain, gross hematuria, dysuria, asymptomatic for infection. No f/c/n/v.     Has not been taking Litholyte. He bought it, but has not been taking it.     Denies IBS, IBD, gastric bypass  Denies Topamax use  Patient with gout, on Allopurinol 300 mg daily     Denies CKD  Denies family history of CKD    Denies any voiding concerns today. Urinating well. Good FOS.      Prior History:   Previously on TC injections q 2 weeks via his PCP. He was switched to Androgel but his insurance would not cover the medication. Currently injecting 0.5 mL TC q 2 weeks. Overall happy with this management.      S/p IPP placement. Not functional. Not sexually active at this time.         H/o  pancreatic cancer, h/o of gout, on Allopurinol daily.       Past Medical History:   Diagnosis Date    Arthritis     Benign prostate hyperplasia     Bladder infection     Chronic kidney disease     Coronary arteriosclerosis     Coronary atherosclerosis of artery bypass graft     Decreased testosterone level     Diverticular disease     Essential hypertension     Gout     History of acute renal failure     History of anemia     History of malignant neoplasm of pancreas     Hx of CABG      Kidney stone     Kidney stones     Neuropathy     NECK, SHOULDERS    Pancreatic cancer (HCC)     Sepsis ([place])      Past Surgical History:   Procedure Laterality Date    CABG, ARTERY-VEIN, FOUR  1995    CARDIAC CATHETERIZATION  2006    CERVICAL FUSION      COLONOSCOPY N/A 07/10/2020    DIAGNOSTIC COLONOSCOPY polypectomy w/hot snare, cold snare , Clip x 1  performed by Dante Gang, MD at [facility]      OTHER SURGICAL HISTORY  01/01/2011    whipple procedure done for pancreatic cancer    UROLOGICAL SURGERY  2012    Percutaneous extraction of a kidney stone w/ fragmentation procedure      Family History   Problem Relation Age of Onset    Emphysema Brother     Alcohol Abuse Father     Heart Disease Brother      Social History     Socioeconomic History    Marital status: Married     Spouse name: Not on file    Number of children: Not on file    Years of education: Not on file    Highest education level: Not on file   Occupational History    Not on file   Tobacco Use    Smoking status: Never    Smokeless tobacco: Never   Substance and Sexual Activity    Alcohol use: Not Currently    Drug use: Not Currently    Sexual activity: Not on file   Other Topics Concern    Not on file   Social History Narrative    ** Merged History Encounter **          Social Determinants of Health     Financial Resource Strain: Not on file   Food Insecurity: Not on file   Transportation Needs: Not on file   Physical Activity: Not on file   Stress: Not on file   Social Connections: Not on file   Intimate Partner Violence: Not on file   Housing Stability: Not on file     Allergies   Allergen Reactions    Codeine Itching and Swelling    Oxycodone Anaphylaxis    Amoxicillin Diarrhea    Hydroxyzine Pamoate Itching and Other (See Comments)     Other reaction(s): Unknown    Meperidine Itching, Other (See Comments), Palpitations and Swelling     Other reaction(s): Unknown     Metoclopramide Itching, Other (See Comments) and Swelling     Other reaction(s): Other (See Comments)  Reaction unknown  Reaction unknown     Morphine Nausea And Vomiting and Other (See Comments)    Oxycodone-Acetaminophen Itching    Prochlorperazine Itching and Other (See Comments)     Other reaction(s): Unknown (comments)  Other reaction(s): Unknown  Unsure of reaction    Hydroxyzine Hcl Hives    Other Other (See Comments)     VISTAID  ANESTHESIA     Current Outpatient Medications   Medication Sig Dispense Refill    acetaminophen (TYLENOL) 325 MG tablet Take 2 tablets by mouth every 6 hours as needed      meloxicam (MOBIC) 7.5 MG tablet TAKE 1 TABLET EVERY DAY BY ORAL ROUTE AS NEEDED.      rosuvastatin (CRESTOR) 20 MG tablet Take 1 tablet by mouth daily      allopurinol (ZYLOPRIM) 300 MG tablet Take by mouth daily      amLODIPine (NORVASC) 5 MG tablet Take 1 tablet by mouth daily      aspirin 81 MG chewable tablet Take 1 tablet by mouth daily      gabapentin (NEURONTIN) 600 MG tablet Take 1 tablet by mouth 2 times daily.      metoprolol succinate (TOPROL XL) 25 MG extended release tablet TAKE 1 TABLET BY MOUTH EVERY DAY      ondansetron (ZOFRAN-ODT) 4 MG disintegrating tablet Take 1 tablet by mouth every 8 hours as needed      lipase-protease-amylase (CREON) 24000-76000 units delayed release capsule Take 2 capsules by mouth      tamsulosin (FLOMAX) 0.4 MG capsule Take 2 capsules by mouth daily      temazepam (RESTORIL) 7.5 MG capsule Take by mouth.      testosterone cypionate (DEPOTESTOTERONE CYPIONATE) 200 MG/ML injection INJECT 0.5 MILLILITER INTO THE MUSCLE EVERY 2 WEEKS DISCARD VIAL AFTER USE      cycloSPORINE (RESTASIS) 0.05 % ophthalmic emulsion Restasis 0.05 % eye drops in a dropperette (Patient not taking: Reported on 05/30/2021)      diclofenac sodium (VOLTAREN) 1 % GEL Apply 2 g topically as needed (Patient not taking: Reported on 05/30/2021)      fluticasone (FLONASE) 50 MCG/ACT nasal spray 2 sprays by  Nasal route daily (Patient not taking: Reported on 05/30/2021)      methylPREDNISolone (MEDROL DOSEPACK) 4 MG tablet  (Patient not taking: Reported on 05/30/2021)      B-D 3CC LUER-LOK SYR 25GX1" 25G X 1" 3 ML MISC USE TO GIVE TESTOSTERONE INJECTION EVERY 2 WEEKS E29.1 (Patient not taking: Reported on 05/30/2021)      tobramycin (TOBREX) 0.3 % ophthalmic solution INSTILL ONE DROP IN THE RIGHT EYE FOUR TIMES DAILY. BEGIN ONE DAY PRIOR TO TREATMENT, CONTINUE THE DAY OF TREATMENT AND ONE DAY AFTER. (Patient not taking: Reported on 05/30/2021)      tobramycin-dexamethasone (TOBRADEX) 0.3-0.1 % ophthalmic suspension  (Patient not taking: Reported on 05/30/2021)      bimatoprost (LUMIGAN) 0.01 % SOLN ophthalmic drops Apply 1 drop to eye (Patient not taking: Reported on 05/30/2021)      polyethyl glycol-propyl glycol 0.4-0.3 % (SYSTANE) 0.4-0.3 % ophthalmic solution Apply 2 drops to eye daily (Patient not taking: Reported on 05/30/2021)      triamcinolone (KENALOG) 0.1 % cream triamcinolone acetonide 0.1 % topical cream (Patient not taking: Reported on 05/30/2021)       No current facility-administered medications for this visit.       Physical Examination:  Ht '5\' 6"'$  (1.676 m)   Wt 135 lb (61.2 kg)   BMI 21.79 kg/m  Constitutional: WDWN, Pleasant and appropriate affect, No acute distress.    CV:  No peripheral swelling noted  Respiratory: No respiratory distress or difficulties  Abdomen:  No abdominal masses or tenderness.  No CVA tenderness.   Skin: No jaundice.    Neuro/Psych:  Alert and oriented x 3. Affect appropriate.     Labs/Radiology:  Results for orders placed or performed in visit on 05/30/21   AMB POC URINALYSIS DIP STICK AUTO W/O MICRO   Result Value Ref Range    Color, Urine, POC Yellow     Clarity, Urine, POC Clear     Glucose, Urine, POC Negative Negative    Bilirubin, Urine, POC Negative Negative    Ketones, Urine, POC Negative Negative    Specific Gravity, Urine, POC 1.015 1.001 - 1.035    Blood, Urine, POC Negative  Negative    pH, Urine, POC 5.5 4.6 - 8.0    Protein, Urine, POC Negative Negative    Urobilinogen, POC 0.2 mg/dL     Nitrite, Urine, POC Negative Negative    Leukocyte Esterase, Urine, POC Negative Negative       CT A/P 03/23/21:  IMPRESSION:     1.  No significant interval change when compared to the prior examination.  Stable resection of the pancreatic head and body without residual metastatic  disease.  2.  Stable complex renal cysts and nonobstructing left calculi.  3.  Small hiatal hernia.  4.  Stable hepatic subcentimeter hypoenhancing lesions.    Litholink:      CT Abd Pelv W Cont 02/17/20  IMPRESSION:  1. Mild acute diverticulitis left lower quadrant.  2. Status post Whipple's procedure, postoperative changes right upper quadrant  not significantly changed from prior study.  3. Fatty infiltration of the liver.  4. Hepatic cysts, subcentimeter hypodensities too small to characterize.  5. Complex bilateral renal cysts, too numerous to characterize.  6. Nonobstructing 2 to 3 mm left nephrolithiasis.  7. Hiatal hernia.      CC: Raleigh Lions, MD     Monia Sabal, PA-C  [facility], [place] [address]  [phone] (office)      Please note that portions of this note were completed with a voice recognition program.  Efforts were made to edit the dictations but occasionally words are mis-transcribed.

## 2021-06-29 ENCOUNTER — Encounter

## 2021-06-30 ENCOUNTER — Inpatient Hospital Stay: Admit: 2021-06-30 | Payer: MEDICARE | Primary: Family Medicine

## 2021-06-30 DIAGNOSIS — K429 Umbilical hernia without obstruction or gangrene: Secondary | ICD-10-CM

## 2021-08-10 ENCOUNTER — Ambulatory Visit: Admit: 2021-08-10 | Discharge: 2021-08-10 | Payer: MEDICARE | Attending: Urology | Primary: Family Medicine

## 2021-08-10 DIAGNOSIS — N200 Calculus of kidney: Secondary | ICD-10-CM

## 2021-08-10 LAB — AMB POC URINALYSIS DIP STICK AUTO W/O MICRO
Bilirubin, Urine, POC: NEGATIVE
Blood, Urine, POC: NEGATIVE
Glucose, Urine, POC: NEGATIVE
Ketones, Urine, POC: NEGATIVE
Leukocyte Esterase, Urine, POC: NEGATIVE
Nitrite, Urine, POC: NEGATIVE
Protein, Urine, POC: NEGATIVE
Specific Gravity, Urine, POC: 1.015 (ref 1.001–1.035)
Urobilinogen, POC: NORMAL
pH, Urine, POC: 5.5 (ref 4.6–8.0)

## 2021-08-10 NOTE — Progress Notes (Unsigned)
Complains of LOWER BACK PAIN, history opf  UA neg   + nausea, no change in urination   No FLANK PAIN     No MOONSTONE due to HA, ? HISTORY OF KCIT       - DECREASE SALT   - CONTINUE ALLOPURINOL   - INCREASE FLUID   - WILL NOT ADDRESS [place] LEVEL AT THIS TIME

## 2021-09-07 NOTE — Telephone Encounter (Signed)
Patient called- he 's been experiencing continuous ache/soreness in his left kidney since his last visit on 08/11/10.He denied having urinary symptoms- pain/burning on urination, frequency and urgency to urinate.  Unable to schedule him appt as there is no available [facility] appt or APP appt anytime soon.  Pls advise.  His tel: [phone]

## 2021-09-10 NOTE — Telephone Encounter (Signed)
LVM for patient to give me a call back.

## 2021-09-17 ENCOUNTER — Encounter: Attending: Urology | Primary: Family Medicine

## 2021-10-10 ENCOUNTER — Encounter: Attending: Urology | Primary: Family Medicine

## 2021-11-15 ENCOUNTER — Ambulatory Visit: Admit: 2021-11-15 | Discharge: 2021-11-15 | Payer: MEDICARE | Primary: Family Medicine

## 2021-11-15 DIAGNOSIS — N200 Calculus of kidney: Secondary | ICD-10-CM

## 2021-11-15 LAB — AMB POC URINALYSIS DIP STICK AUTO W/O MICRO
Bilirubin, Urine, POC: NEGATIVE
Blood, Urine, POC: NEGATIVE
Glucose, Urine, POC: NEGATIVE
Ketones, Urine, POC: NEGATIVE
Leukocyte Esterase, Urine, POC: NEGATIVE
Nitrite, Urine, POC: NEGATIVE
Protein, Urine, POC: NEGATIVE
Specific Gravity, Urine, POC: 1.01 (ref 1.001–1.035)
Urobilinogen, POC: 0.2
pH, Urine, POC: 5.5 (ref 4.6–8.0)

## 2021-11-15 NOTE — Progress Notes (Signed)
Dr. Oleta Mouse is the supervising physician for this day.    Tommy Brown  DOB: 1945/09/20  Encounter Date: 11/15/2021       ICD-10-CM    1. Calculus of kidney  N20.0 AMB POC URINALYSIS DIP STICK AUTO W/O MICRO     CT ABDOMEN PELVIS W WO CONTRAST Additional Contrast? Radiologist Recommendation      2. Bilateral renal cysts  N28.1 CT ABDOMEN PELVIS W WO CONTRAST Additional Contrast? Radiologist Recommendation               ASSESSMENT:  - Nephrolithiasis               Surgical History:               S/p URS with LL in 02/2017, for a left 57m proximal ureteral stone with hydroureteronephrosis and 132mLLP stone              S/p left jj stent on 06/22/19 with Dr. KeClaiborne Billings             S/p Cystoscopy, left retrograde pyelogram, left ureteroscopy, holmium laser lithotripsy of proximal ureteral stone and extraction of left renal calculi and left double-J ureteral stent exchange on 07/31/18, string with stent removed on 08/06/18.                  Medical Therapy: Allopurinol 300 mg daily               Stone Composition: calcium oxalate 100% 07/31/18.               24 Hr Urine Panel: 09/27/19: Very low urine volume(1.92), very low urinary citrate (99), very elevated urinary sodium (324), mildly low urinary pH (5.65).      05/01/20: low urine volume (1.27), mild hyperoxaluria (45), extreme hypocitraturia (<19), low urine volume (5.513)                     Imaging:               RUS 01/13/19: Suboptimal exam. No hydronephrosis. Bilateral renal calcifications versus nonobstructing calculi.               CT A/P 06/22/18: Multiple small (2 to 4 mm) nonobstructing bilateral renal calculi. There is a 10 x 9 mm nonobstructing calculus in the lower pole of the left kidney. Moderate left hydronephrosis and hydroureter. There is a 8 x 7 mm obstructing calculus in the proximal left ureter. No right hydronephrosis. No suspicious renal lesions.               R [place]/14/2021: Right kidney 9 mm, 6 mm left kidney.  No evidence of hydronephrosis.   Uncomplicated cyst.              CT A/P w/ contrast 10/28/19: KIDNEYS/URETERS: Multiple punctate (1 to 2 mm) bilateral nonobstructing calculi. Numerous bilateral simple cysts for which no follow-up is needed. No suspicious masses. No hydronephrosis.  CT Abd Pelv 02/17/20: Nonobstructing 2 to 3 mm left nephrolithiasis.   [facility]able nonobstructing intrarenal calculi, left greater than right.   KM read: multiple small, nonobstructive stones bilaterally with largest in left measuring 3 mm     - Bilateral complex renal cysts  CT A/P 03/23/21:  stable bilateral complex renal cysts    - Hypogonadism, Previously managed by Dr.  DeDaralene MilchMD  Last Testosterone level was 567 ng/dL on 03/25/19.               CBC 03/25/19 reviewed with patient.               On TC 0.5 mL injections q 2 weeks as managed by PCP.      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            PLAN:   UA today negative  Continue to increase urine output to greater than 2.5 L daily, follow low oxalate diet  Continue on Allopurinol 300 mg daily   Again recommend starting Moonstone 2 pills BID   Will reorder 24 hr Litholink to be completed prior to next visit   CT A/P w/wo ordered to assess stones and complex cysts  Follow up 3 months to review CT scan and 24 hr Litholink      DISCUSSION: We discussed dietary methods for stone prevention including the following: increased water intake to 2-3 liters per day, add lemon or lemon concentrate to water or Crystal Lite lemonade to increase citrate which is beneficial for stone prevention, limiting dietary sodium to less than 2300 mg per day, limiting animal protein 8 oz or less daily, and limiting foods high in oxalate content  (spinach, beans, chocolate, etc.).        Chief  Complaint   Patient presents with    Nephrolithiasis       HISTORY OF PRESENT ILLNESS:   Tommy Brown is a 76 y.o. male who presents today in follow up for an established diagnosis of kidney stone.     Patient doing well. Denies any interval stone events. Reports that he went to do the [place] study and it was expired - so was unable to complete. He reportedly bought the moonstone powder and gave it away to a friend with kidney stones. He is unsure why he did not take the pills.     Does endorse ~2 weeks ago, was having right side pains that have since subsided. Unsure if was a kidney stone. Today, denies flank pain, gross hematuria, dysuria, asymptomatic for infection. No f/c/n/v.     Prior Hx:  Denies IBS, IBD, gastric bypass  Denies Topamax use  Patient with gout, on Allopurinol 300 mg daily     Denies CKD  Denies family history of CKD    Denies any voiding concerns today. Urinating well. Good FOS.      Previously on TC injections q 2 weeks via his PCP. He was switched to Androgel but his insurance would not cover the medication. Currently injecting 0.5 mL TC q 2 weeks. Overall happy with this management.      S/p IPP placement. Not functional. Not sexually active at this time.         H/o pancreatic cancer, h/o of gout, on Allopurinol daily.       Past Medical History:   Diagnosis Date    Arthritis     Benign prostate hyperplasia     Bladder infection     Chronic kidney disease  Coronary arteriosclerosis     Coronary atherosclerosis of artery bypass graft     Decreased testosterone level     Diverticular disease     Essential hypertension     Gout     History of acute renal failure     History of anemia     History of malignant neoplasm of pancreas     Hx of CABG     Kidney stone     Kidney stones     Neuropathy     NECK, SHOULDERS    Pancreatic cancer (HCC)     Sepsis ([place])      Past Surgical History:   Procedure Laterality Date    CABG, ARTERY-VEIN, FOUR  1995    CARDIAC CATHETERIZATION  2006     CERVICAL FUSION      COLONOSCOPY N/A 07/10/2020    DIAGNOSTIC COLONOSCOPY polypectomy w/hot snare, cold snare , Clip x 1  performed by Dante Gang, MD at [facility]      OTHER SURGICAL HISTORY  01/01/2011    whipple procedure done for pancreatic cancer    UROLOGICAL SURGERY  2012    Percutaneous extraction of a kidney stone w/ fragmentation procedure      Family History   Problem Relation Age of Onset    Emphysema Brother     Alcohol Abuse Father     Heart Disease Brother      Social History     Socioeconomic History    Marital status: Married     Spouse name: Not on file    Number of children: Not on file    Years of education: Not on file    Highest education level: Not on file   Occupational History    Not on file   Tobacco Use    Smoking status: Never    Smokeless tobacco: Never   Vaping Use    Vaping Use: Never used   Substance and Sexual Activity    Alcohol use: Not Currently    Drug use: Not Currently    Sexual activity: Not on file   Other Topics Concern    Not on file   Social History Narrative    ** Merged History Encounter **          Social Determinants of Health     Financial Resource Strain: Not on file   Food Insecurity: Not on file   Transportation Needs: Not on file   Physical Activity: Not on file   Stress: Not on file   Social Connections: Not on file   Intimate Partner Violence: Not on file   Housing Stability: Not on file     Allergies   Allergen Reactions    Codeine Itching and Swelling    Oxycodone Anaphylaxis    Amoxicillin Diarrhea    Hydroxyzine Pamoate Itching and Other (See Comments)     Other reaction(s): Unknown    Meperidine Itching, Other (See Comments), Palpitations and Swelling     Other reaction(s): Unknown    Metoclopramide Itching, Other (See Comments) and Swelling     Other reaction(s): Other (See Comments)  Reaction unknown   Reaction unknown     Morphine Nausea And Vomiting and Other (See  Comments)    Oxycodone-Acetaminophen Itching    Prochlorperazine Itching and Other (See Comments)     Other reaction(s): Unknown (comments)  Other reaction(s): Unknown  Unsure of reaction    Hydroxyzine Hcl Hives    Other Other (See Comments)     VISTAID  ANESTHESIA     Current Outpatient Medications   Medication Sig Dispense Refill    tiZANidine HCl (ZANAFLEX PO) '5mg'$       furosemide (LASIX) 20 MG tablet Furosemide Oral    tablet PRN    active      methocarbamol (ROBAXIN) 500 MG tablet Take 1 tablet by mouth daily as needed      acetaminophen (TYLENOL) 325 MG tablet Take 2 tablets by mouth every 6 hours as needed      rosuvastatin (CRESTOR) 20 MG tablet Take 1 tablet by mouth daily      allopurinol (ZYLOPRIM) 300 MG tablet Take by mouth daily      amLODIPine (NORVASC) 5 MG tablet Take 1 tablet by mouth daily      aspirin 81 MG chewable tablet Take 1 tablet by mouth daily      gabapentin (NEURONTIN) 600 MG tablet Take 1 tablet by mouth 2 times daily.      metoprolol succinate (TOPROL XL) 25 MG extended release tablet TAKE 1 TABLET BY MOUTH EVERY DAY      ondansetron (ZOFRAN-ODT) 4 MG disintegrating tablet Take 1 tablet by mouth every 8 hours as needed      lipase-protease-amylase (CREON) 24000-76000 units delayed release capsule Take 2 capsules by mouth      tamsulosin (FLOMAX) 0.4 MG capsule Take 2 capsules by mouth daily      temazepam (RESTORIL) 7.5 MG capsule Take by mouth.      testosterone cypionate (DEPOTESTOTERONE CYPIONATE) 200 MG/ML injection INJECT 0.5 MILLILITER INTO THE MUSCLE EVERY 2 WEEKS DISCARD VIAL AFTER USE      cycloSPORINE (RESTASIS) 0.05 % ophthalmic emulsion  (Patient not taking: Reported on 11/15/2021)      Mouthwashes (BIOTENE) LIQD oral solution USE 15 ML AND SWISH FOR 30 SECONDS THEN SPIT OUT- 4 TO 5 TIMES DAILY AS NEEDED FOR DRY MOUTH (Patient not taking: Reported on 11/15/2021)      B-D 3CC LUER-LOK SYR 25GX1" 25G X 1" 3 ML MISC  (Patient not taking: Reported on 11/15/2021)       tobramycin-dexamethasone (TOBRADEX) 0.3-0.1 % ophthalmic suspension  (Patient not taking: Reported on 11/15/2021)      bimatoprost (LUMIGAN) 0.01 % SOLN ophthalmic drops Apply 1 drop to eye (Patient not taking: Reported on 11/15/2021)       No current facility-administered medications for this visit.       Physical Examination:  Ht '5\' 6"'$  (1.676 m)   Wt 130 lb 9.6 oz (59.2 kg)   BMI 21.08 kg/m   Constitutional: WDWN, Pleasant and appropriate affect, No acute distress.    CV:  No peripheral swelling noted  Respiratory: No respiratory distress or difficulties  Abdomen:  No abdominal masses or tenderness.  No CVA tenderness.   Skin: No jaundice.    Neuro/Psych:  Alert and oriented x 3. Affect appropriate.     Labs/Radiology:  Results for orders placed or performed in visit on 11/15/21   AMB POC URINALYSIS DIP STICK AUTO W/O MICRO   Result Value Ref Range    Color, Urine, POC Yellow     Clarity, Urine, POC Clear     Glucose, Urine, POC Negative Negative    Bilirubin, Urine, POC Negative Negative    Ketones, Urine, POC Negative Negative    Specific Gravity, Urine, POC 1.010 1.001 - 1.035  Blood, Urine, POC Negative Negative    pH, Urine, POC 5.5 4.6 - 8.0    Protein, Urine, POC Negative Negative    Urobilinogen, POC 0.2 mg/dL     Nitrite, Urine, POC Negative Negative    Leukocyte Esterase, Urine, POC Negative Negative         CT A/P 03/23/21:  IMPRESSION:     1.  No significant interval change when compared to the prior examination.  Stable resection of the pancreatic head and body without residual metastatic  disease.  2.  Stable complex renal cysts and nonobstructing left calculi.  3.  Small hiatal hernia.  4.  Stable hepatic subcentimeter hypoenhancing lesions.    Litholink:      CT Abd Pelv W Cont 02/17/20  IMPRESSION:  1. Mild acute diverticulitis left lower quadrant.  2. Status post Whipple's procedure, postoperative changes right upper quadrant  not significantly changed from prior study.  3. Fatty infiltration  of the liver.  4. Hepatic cysts, subcentimeter hypodensities too small to characterize.  5. Complex bilateral renal cysts, too numerous to characterize.  6. Nonobstructing 2 to 3 mm left nephrolithiasis.  7. Hiatal hernia.      CC: Raleigh Lions, MD     Monia Sabal, PA-C  [facility], [place] [address]  [phone] (office)      Please note that portions of this note were completed with a voice recognition program.  Efforts were made to edit the dictations but occasionally words are mis-transcribed.

## 2021-11-15 NOTE — Addendum Note (Signed)
Addended by: Carleene Cooper on: 11/15/2021 04:34 PM     Modules accepted: Orders

## 2021-11-16 NOTE — Telephone Encounter (Signed)
Tommy Brown has an order for CT      ALL MALE PATIENTS NEEDING AN MRI OF PELVIS OR PROSTATE, MUST BE SCHEDULED AT ONE OF THE FOLLOWING:    **MRI/CT (Preferred [facility])  **[facility] [place] (Preferred [facility])  **[facility] (Preferred [place])  [facility]  [place]      To be done at  [facility]    Needed by:  January 25, 2022    Patient has a follow-up appointment:  Yes     If MRI, does patient have a pacemaker:   No    Order has been placed in connect care:  Yes    Is this a STAT order:  No

## 2021-11-20 NOTE — Telephone Encounter (Signed)
Fax will be sent at 12/26/2021, 9:50 AM

## 2021-12-22 LAB — LITHOLINK 24 HOUR URINE PANEL
Ammonium, Urine: 30 mmol/24 hr (ref 15–60)
Calcium Creatinine Ratio: 76 mg/g creat (ref 34–196)
Calcium Oxalate Saturation: 6.43 (ref 6.00–10.00)
Calcium Phosphate Saturation: 0.17 — ABNORMAL LOW (ref 0.50–2.00)
Calcium, Urine: 74 mg/24 hr (ref <250)
Calcium/Kg Body Weight: 1.3 mg/24 hr/kg (ref <4.0)
Chloride Urine: 211 mmol/24 hr (ref 70–250)
Citrate, Ur: <22 mg/24 hr — ABNORMAL LOW (ref >450)
Creatinine, Ur: 980 mg/24 hr
Creatinine/Kg Body Weight: 16.6 mg/24 hr/kg (ref 11.9–24.4)
Cystine, Urine: NEGATIVE
Magnesium, Ur: 132 mg/24 hr — ABNORMAL HIGH (ref 30–120)
Oxalate, Ur: 62 mg/24 hr — ABNORMAL HIGH (ref 20–40)
Phosphorus, Ur: 626 mg/24 hr (ref 600–1200)
Potassium, Urine: 27 mmol/24 hr (ref 20–100)
Protein Catabolic Rate, U: 0.6 g/kg/24 hr — ABNORMAL LOW (ref 0.8–1.4)
Sodium, Ur: 192 mmol/24 hr — ABNORMAL HIGH (ref 50–150)
Sulfate Urine: 9 meq/24 hr — ABNORMAL LOW (ref 20–80)
Urea Nitrogen, Ur: 4.24 g/24 hr — ABNORMAL LOW (ref 6.00–14.00)
Uric Acid Saturation: 0.4 (ref <1.00)
Uric Acid, Ur: 137 mg/24 hr (ref <800)
Urine Volume (Preservative): 1440 mL/24 hr (ref 500–4000)
pH, 24 Hr Urine: 5.547 — ABNORMAL LOW (ref 5.800–6.200)

## 2021-12-24 NOTE — Other (Signed)
12/17/2021: Low urine volume (1.44), hyperoxaluria (62), extreme hypocitraturia (<22), low pH (5.547), elevated sodium (192)    We will review at visit scheduled on 02/14/2022.    Monia Sabal, PA-C

## 2022-01-16 NOTE — Telephone Encounter (Signed)
Pt called and wanted to know if his order for imaging has been sent to the [facility]. If you can confirm this I can call the pt back. CBN: [phone]

## 2022-01-16 NOTE — Telephone Encounter (Signed)
Faxed imaging to [facility] [phone]; CT; NOW   (Please allow at least 72 hours for processing) PATIENT MAY CALL [phone] TO SCHEDULE

## 2022-01-18 ENCOUNTER — Encounter

## 2022-01-24 ENCOUNTER — Ambulatory Visit: Payer: MEDICARE | Primary: Family Medicine

## 2022-01-25 ENCOUNTER — Inpatient Hospital Stay: Admit: 2022-01-25 | Payer: MEDICARE | Primary: Family Medicine

## 2022-01-25 DIAGNOSIS — N200 Calculus of kidney: Secondary | ICD-10-CM

## 2022-01-25 DIAGNOSIS — N281 Cyst of kidney, acquired: Secondary | ICD-10-CM

## 2022-01-25 LAB — POCT CREATININE - BLOOD: Creatinine: 1.5 mg/dl — ABNORMAL HIGH (ref 0.6–1.3)

## 2022-01-25 MED ORDER — IOPAMIDOL 61 % IV SOLN
61 | Freq: Once | INTRAVENOUS | Status: AC | PRN
Start: 2022-01-25 — End: 2022-01-25
  Administered 2022-01-25: 18:00:00 70 mL via INTRAVENOUS

## 2022-01-25 NOTE — Other (Signed)
Reviewed. Will discuss with patient at visit on 02/14/22.    Monia Sabal, PA-C  [facility] [place]

## 2022-02-14 ENCOUNTER — Ambulatory Visit: Attending: Physical Medicine & Rehabilitation | Primary: Family Medicine

## 2022-02-14 ENCOUNTER — Ambulatory Visit: Admit: 2022-02-14 | Payer: MEDICARE | Primary: Family Medicine

## 2022-02-14 DIAGNOSIS — N200 Calculus of kidney: Secondary | ICD-10-CM

## 2022-02-14 LAB — AMB POC URINALYSIS DIP STICK AUTO W/O MICRO
Bilirubin, Urine, POC: NEGATIVE
Blood, Urine, POC: NEGATIVE
Glucose, Urine, POC: NEGATIVE
Ketones, Urine, POC: NEGATIVE
Leukocyte Esterase, Urine, POC: NEGATIVE
Nitrite, Urine, POC: NEGATIVE
Protein, Urine, POC: NEGATIVE
Specific Gravity, Urine, POC: 1.025 (ref 1.001–1.035)
Urobilinogen, POC: 0.2
pH, Urine, POC: 6 (ref 4.6–8.0)

## 2022-02-14 NOTE — Telephone Encounter (Signed)
Tommy Brown has an order for RUS      ALL MALE PATIENTS NEEDING AN MRI OF PELVIS OR PROSTATE, MUST BE SCHEDULED AT ONE OF THE FOLLOWING:    **MRI/CT ([facility])  **[facility] [place] (Preferred [facility])  **[place] (Preferred [place])  [facility]  [place]      To be done at  [place] by:  August 20, 2022    Patient has a follow-up appointment:  Yes     If MRI, does patient have a pacemaker:   No    Order has been placed in connect care:  Yes    Is this a STAT order:  No

## 2022-02-14 NOTE — Progress Notes (Signed)
Dr. Elonda Husky is the supervising physician for this day.    Tommy Brown  DOB: 1946/02/25  Encounter Date: 02/14/2022       ICD-10-CM    1. Calculus of kidney  N20.0 AMB POC URINALYSIS DIP STICK AUTO W/O MICRO                 ASSESSMENT:  - Nephrolithiasis               Surgical History:               S/p URS with LL in 02/2017, for a left 50m proximal ureteral stone with hydroureteronephrosis and 165mLLP stone              S/p left jj stent on 06/22/19 with Dr. KeClaiborne Billings             S/p Cystoscopy, left retrograde pyelogram, left ureteroscopy, holmium laser lithotripsy of proximal ureteral stone and extraction of left renal calculi and left double-J ureteral stent exchange on 07/31/18, string with stent removed on 08/06/18.                  Medical Therapy: Allopurinol 300 mg daily               Stone Composition: calcium oxalate 100% 07/31/18.               24 Hr Urine Panel: 09/27/19: Very low urine volume(1.92), very low urinary citrate (99), very elevated urinary sodium (324), mildly low urinary pH (5.65).      05/01/20: low urine volume (1.27), mild hyperoxaluria (45), extreme hypocitraturia (<19), low urine volume (5.513)     12/17/2021: Low urine volume (1.44), hyperoxaluria (62), extreme hypocitraturia (<22), low pH (5.547), elevated sodium (192)                     Imaging:               RUS 01/13/19: Suboptimal exam. No hydronephrosis. Bilateral renal calcifications versus nonobstructing calculi.               CT A/P 06/22/18: Multiple small (2 to 4 mm) nonobstructing bilateral renal calculi. There is a 10 x 9 mm nonobstructing calculus in the lower pole of the left kidney. Moderate left hydronephrosis and hydroureter. There is a 8 x 7 mm obstructing calculus in the proximal left ureter. No right hydronephrosis. No suspicious renal lesions.               RUS 09/08/2019: Right kidney 9 mm, 6 mm left kidney.  No evidence of hydronephrosis.  Uncomplicated cyst.              CT A/P w/ contrast 10/28/19: KIDNEYS/URETERS:  Multiple punctate (1 to 2 mm) bilateral nonobstructing calculi. Numerous bilateral simple cysts for which no follow-up is needed. No suspicious masses. No hydronephrosis.  CT Abd Pelv 02/17/20: Nonobstructing 2 to 3 mm left nephrolithiasis.   [facility] nonobstructing intrarenal calculi, left greater than right.   KM read: multiple small, nonobstructive stones bilaterally with largest in left measuring 3 mm    CT A/P 01/25/22: Tiny nonobstructing bilateral renal calculi, not significantly changed.    - Bilateral complex renal cysts  CT A/P 03/23/21:  stable bilateral complex renal cysts  CT A/P w/wo 01/25/22: Stable complex and simple bilateral renal cysts.    - Hypogonadism, Previously managed by Dr.  DeRondell ReamsElAundra DubinMD  Last Testosterone level was 567 ng/dL on 03/25/19.               CBC 03/25/19 reviewed with patient.               On TC 0.5 mL injections q 2 weeks as managed by PCP.      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            PLAN:   UA today negative   Reviewed CT from 01/25/22  Reviewed repeat Litholink  Again recommend patient aim for urine output between 2-2.5 L daily, limit oxalates in diet, increase dietary citrate (lemons or limes in water, Crystal Lite lemonade, low cal OJ), limit daily sodium intake to <2300 mg total  Discussed starting Potassium Citrate, will hold off as stones have been stable over last year. Pt agrees. Will work on dietary measures.   Follow up 6 months with RUS prior, or sooner PRN        DISCUSSION: We discussed dietary methods for stone prevention including the following: increased water intake to 2-3 liters per day, add lemon or lemon concentrate to water or Crystal Lite lemonade to increase citrate which is beneficial for stone  prevention, limiting dietary sodium to less than 2300 mg per day, limiting animal protein 8 oz or less daily, and limiting foods high in oxalate content  (spinach, beans, chocolate, etc.).        Chief Complaint   Patient presents with    Nephrolithiasis       HISTORY OF PRESENT ILLNESS:   Tommy Brown is a 76 y.o. male who presents today in follow up for an established diagnosis of kidney stones and complex renal cysts.      Patient doing well today. Denies any interval stone events. Denies flank pain, gross hematuria, dysuria, asymptomatic for infection. No f/c/n/v.     When he did the repeat Litholink, was taking 2-3 Moonstone caps daily. He is no longer taking these. Continues on Allopurinol daily for gout.     Denies DM    Has not been adding lemons to water. Does like potatoes.     Prior Hx:  Denies IBS, IBD, gastric bypass  Denies Topamax use  Patient with gout, on Allopurinol 300 mg daily     Denies CKD  Denies family history of CKD    Denies any voiding concerns today. Urinating well. Good FOS.      Previously on TC injections q 2 weeks via his PCP. He was switched to Androgel but his insurance would not cover the medication. Currently injecting 0.5 mL TC q 2 weeks. Overall happy with this management.      S/p IPP placement. Not functional. Not sexually active at this time.         H/o pancreatic cancer, h/o of gout, on Allopurinol daily.       Past Medical History:   Diagnosis Date    Arthritis     Benign prostate hyperplasia     Bladder infection     Chronic kidney disease  Coronary arteriosclerosis     Coronary atherosclerosis of artery bypass graft     Decreased testosterone level     Diverticular disease     Essential hypertension     Gout     History of acute renal failure     History of anemia     History of malignant neoplasm of pancreas     Hx of CABG     Kidney stone     Kidney stones     Neuropathy     NECK, SHOULDERS    Pancreatic cancer ([facility]CC)     Sepsis ([place])      Past Surgical History:    Procedure Laterality Date    CABG, ARTERY-VEIN, FOUR  1995    CARDIAC CATHETERIZATION  2006    CERVICAL FUSION      COLONOSCOPY N/A 07/10/2020    DIAGNOSTIC COLONOSCOPY polypectomy w/hot snare, cold snare , Clip x 1  performed by Dante Gang, MD at [facility]      OTHER SURGICAL HISTORY  01/01/2011    whipple procedure done for pancreatic cancer    UROLOGICAL SURGERY  2012    Percutaneous extraction of a kidney stone w/ fragmentation procedure      Family History   Problem Relation Age of Onset    Emphysema Brother     Alcohol Abuse Father     Heart Disease Brother      Social History     Socioeconomic History    Marital status: Married     Spouse name: Not on file    Number of children: Not on file    Years of education: Not on file    Highest education level: Not on file   Occupational History    Not on file   Tobacco Use    Smoking status: Never    Smokeless tobacco: Never   Vaping Use    Vaping Use: Never used   Substance and Sexual Activity    Alcohol use: Not Currently    Drug use: Not Currently    Sexual activity: Defer   Other Topics Concern    Not on file   Social History Narrative    ** Merged History Encounter **          Social Determinants of Health     Financial Resource Strain: Not on file   Food Insecurity: Not on file   Transportation Needs: Not on file   Physical Activity: Not on file   Stress: Not on file   Social Connections: Not on file   Intimate Partner Violence: Not on file   Housing Stability: Not on file     Allergies   Allergen Reactions    Codeine Itching and Swelling    Oxycodone Anaphylaxis    Amoxicillin Diarrhea    Hydroxyzine Pamoate Itching and Other (See Comments)     Other reaction(s): Unknown    Meperidine Itching, Other (See Comments), Palpitations and Swelling     Other reaction(s): Unknown    Metoclopramide Itching, Other (See Comments) and Swelling     Other reaction(s): Other (See  Comments)  Reaction unknown   Reaction unknown     Morphine Nausea And Vomiting and Other (See Comments)    Oxycodone-Acetaminophen Itching    Prochlorperazine Itching and Other (See Comments)     Other reaction(s): Unknown (comments)  Other reaction(s):  Unknown  Unsure of reaction    Hydroxyzine Hcl Hives    Other Other (See Comments)     VISTAID  ANESTHESIA     Current Outpatient Medications   Medication Sig Dispense Refill    tiZANidine HCl (ZANAFLEX PO) '5mg'$       furosemide (LASIX) 20 MG tablet Furosemide Oral    tablet PRN    active      methocarbamol (ROBAXIN) 500 MG tablet Take 1 tablet by mouth daily as needed      acetaminophen (TYLENOL) 325 MG tablet Take 2 tablets by mouth every 6 hours as needed      rosuvastatin (CRESTOR) 20 MG tablet Take 1 tablet by mouth daily      allopurinol (ZYLOPRIM) 300 MG tablet Take by mouth daily      amLODIPine (NORVASC) 5 MG tablet Take 1 tablet by mouth daily      aspirin 81 MG chewable tablet Take 1 tablet by mouth daily      gabapentin (NEURONTIN) 600 MG tablet Take 1 tablet by mouth 2 times daily.      metoprolol succinate (TOPROL XL) 25 MG extended release tablet TAKE 1 TABLET BY MOUTH EVERY DAY      ondansetron (ZOFRAN-ODT) 4 MG disintegrating tablet Take 1 tablet by mouth every 8 hours as needed      tamsulosin (FLOMAX) 0.4 MG capsule Take 2 capsules by mouth daily      temazepam (RESTORIL) 7.5 MG capsule Take by mouth.      testosterone cypionate (DEPOTESTOTERONE CYPIONATE) 200 MG/ML injection INJECT 0.5 MILLILITER INTO THE MUSCLE EVERY 2 WEEKS DISCARD VIAL AFTER USE      Mouthwashes (BIOTENE) LIQD oral solution USE 15 ML AND SWISH FOR 30 SECONDS THEN SPIT OUT- 4 TO 5 TIMES DAILY AS NEEDED FOR DRY MOUTH (Patient not taking: Reported on 11/15/2021)      B-D 3CC LUER-LOK SYR 25GX1" 25G X 1" 3 ML MISC  (Patient not taking: Reported on 11/15/2021)      tobramycin-dexamethasone (TOBRADEX) 0.3-0.1 % ophthalmic suspension  (Patient not taking: Reported on 11/15/2021)       bimatoprost (LUMIGAN) 0.01 % SOLN ophthalmic drops Apply 1 drop to eye (Patient not taking: Reported on 02/14/2022)      lipase-protease-amylase (CREON) 24000-76000 units delayed release capsule Take 2 capsules by mouth (Patient not taking: Reported on 02/14/2022)       No current facility-administered medications for this visit.       Physical Examination:  Resp 16   Ht 1.676 m ('5\' 6"'$ )   Wt 61.7 kg (136 lb)   BMI 21.95 kg/m   Constitutional: WDWN, Pleasant and appropriate affect, No acute distress.    CV:  No peripheral swelling noted  Respiratory: No respiratory distress or difficulties  Abdomen:  No abdominal masses or tenderness.  No CVA tenderness.   Skin: No jaundice.    Neuro/Psych:  Alert and oriented x 3. Affect appropriate.     Labs/Radiology:  Results for orders placed or performed in visit on 02/14/22   AMB POC URINALYSIS DIP STICK AUTO W/O MICRO   Result Value Ref Range    Color, Urine, POC      Clarity, Urine, POC      Glucose, Urine, POC Negative     Bilirubin, Urine, POC Negative     Ketones, Urine, POC Negative     Specific Gravity, Urine, POC 1.025 1.001 - 1.035    Blood, Urine, POC Negative     pH,  Urine, POC 6.0 4.6 - 8.0    Protein, Urine, POC Negative     Urobilinogen, POC 0.2 mg/dL     Nitrite, Urine, POC Negative     Leukocyte Esterase, Urine, POC Negative        CT A/P w/wo 01/25/22:  IMPRESSION:  1. No significant interval change.  2. Postoperative changes related to Whipple surgery.  3. Stable complex and simple bilateral renal cysts.  4. Nonobstructing bilateral renal calculi.  5. Enlarged prostate gland.  6. Additional findings as described.    CT A/P 03/23/21:  IMPRESSION:     1.  No significant interval change when compared to the prior examination.  Stable resection of the pancreatic head and body without residual metastatic  disease.  2.  Stable complex renal cysts and nonobstructing left calculi.  3.  Small hiatal hernia.  4.  Stable hepatic subcentimeter hypoenhancing  lesions.    L[facility]olink:      CT Abd Pelv W Cont 02/17/20  IMPRESSION:  1. Mild acute diverticulitis left lower quadrant.  2. Status post Whipple's procedure, postoperative changes right upper quadrant  not significantly changed from prior study.  3. Fatty infiltration of the liver.  4. Hepatic cysts, subcentimeter hypodensities too small to characterize.  5. Complex bilateral renal cysts, too numerous to characterize.  6. Nonobstructing 2 to 3 mm left nephrolithiasis.  7. Hiatal hernia.      CC: Deguzman-Berube, Aundra Dubin, MD     Monia Sabal, PA-C  [facility] [place] [address]  [phone] (office)      Please note that portions of this note were completed with a voice recognition program.  Efforts were made to edit the dictations but occasionally words are mis-transcribed.

## 2022-03-11 ENCOUNTER — Encounter

## 2022-03-11 ENCOUNTER — Ambulatory Visit: Payer: MEDICARE | Primary: Family Medicine

## 2022-03-12 ENCOUNTER — Inpatient Hospital Stay: Admit: 2022-03-12 | Payer: MEDICARE | Primary: Family Medicine

## 2022-03-12 DIAGNOSIS — M79606 Pain in leg, unspecified: Secondary | ICD-10-CM

## 2022-05-09 ENCOUNTER — Inpatient Hospital Stay
Admit: 2022-05-09 | Discharge: 2022-05-12 | Disposition: A | Discharge status: Home / Self Care | Payer: Medicare Other | Admitting: Internal Medicine

## 2022-05-09 DIAGNOSIS — K625 Hemorrhage of anus and rectum: Secondary | ICD-10-CM

## 2022-05-09 DIAGNOSIS — K5731 Diverticulosis of large intestine without perforation or abscess with bleeding: Principal | ICD-10-CM

## 2022-05-09 LAB — COMPREHENSIVE METABOLIC PANEL
ALT: 78 U/L — ABNORMAL HIGH (ref 10–49)
AST: 61 U/L — ABNORMAL HIGH (ref 0.0–33.9)
Albumin: 3.3 gm/dl — ABNORMAL LOW (ref 3.4–5.0)
Alkaline Phosphatase: 85 U/L (ref 46–116)
Anion Gap: 5 mmol/L (ref 5–15)
BUN: 35 mg/dl — ABNORMAL HIGH (ref 9–23)
CO2: 26 mEq/L (ref 20–31)
Calcium: 8.4 mg/dl — ABNORMAL LOW (ref 8.7–10.4)
Chloride: 105 mEq/L (ref 98–107)
Creatinine: 1.79 mg/dl — ABNORMAL HIGH (ref 0.70–1.30)
GFR African American: 47
GFR Non-African American: 39
Glucose: 178 mg/dl — ABNORMAL HIGH (ref 74–106)
Potassium: 4.6 mEq/L (ref 3.5–5.1)
Sodium: 136 mEq/L (ref 136–145)
Total Bilirubin: 0.5 mg/dl (ref 0.30–1.20)
Total Protein: 5.7 gm/dl (ref 5.7–8.2)

## 2022-05-09 LAB — CBC WITH AUTO DIFFERENTIAL
Basophils: 0.2 % (ref 0–3)
Eosinophils: 1 % (ref 0–5)
Hematocrit: 31.5 % — ABNORMAL LOW (ref 36.0–55.0)
Hemoglobin: 10.6 gm/dl — ABNORMAL LOW (ref 13.0–18.0)
Immature Granulocytes: 0.8 % (ref 0.0–3.0)
Lymphocytes: 21.4 % — ABNORMAL LOW (ref 28–48)
MCH: 33.1 pg (ref 23.0–34.6)
MCHC: 33.7 gm/dl (ref 30.0–36.0)
MCV: 98.4 fL — ABNORMAL HIGH (ref 80.0–98.0)
MPV: 12.2 fL — ABNORMAL HIGH (ref 6.0–10.0)
Monocytes: 6.9 % (ref 1–13)
Neutrophils Segmented: 69.7 % — ABNORMAL HIGH (ref 34–64)
Nucleated RBCs: 0 (ref 0–0)
Platelets: 124 10*3/uL — ABNORMAL LOW (ref 140–450)
RBC: 3.2 M/uL — ABNORMAL LOW (ref 3.80–5.70)
RDW: 52.1 — ABNORMAL HIGH (ref 35.1–43.9)
WBC: 8.6 10*3/uL (ref 4.0–11.0)

## 2022-05-09 LAB — HEMOGLOBIN AND HEMATOCRIT
Hematocrit: 30.1 % — ABNORMAL LOW (ref 36.0–55.0)
Hemoglobin: 10 gm/dl — ABNORMAL LOW (ref 13.0–18.0)

## 2022-05-09 LAB — PROTIME-INR
INR: 1.1 (ref 0.1–1.1)
Protime: 12.5 seconds (ref 10.2–12.9)

## 2022-05-09 LAB — ABO/RH: ABO/Rh: A NEG

## 2022-05-09 LAB — ANTIBODY SCREEN: Antibody Screen: NEGATIVE

## 2022-05-09 MED ORDER — SODIUM CHLORIDE (PF) 0.9 % IJ SOLN
0.9 | Freq: Once | INTRAMUSCULAR | Status: AC
Start: 2022-05-09 — End: 2022-05-09
  Administered 2022-05-09: 22:00:00 80 mg via INTRAVENOUS

## 2022-05-09 MED ORDER — SODIUM CHLORIDE 0.9 % IV SOLN (MINI-BAG)
0.9 % | INTRAVENOUS | Status: AC
Start: 2022-05-09 — End: 2022-05-12
  Administered 2022-05-09 – 2022-05-11 (×8): 8 mg/h via INTRAVENOUS

## 2022-05-09 MED ORDER — DEXTROSE-NACL 5-0.9 % IV SOLN
5-0.9 % | INTRAVENOUS | Status: DC
Start: 2022-05-09 — End: 2022-05-12
  Administered 2022-05-09 – 2022-05-12 (×5): via INTRAVENOUS

## 2022-05-09 MED ORDER — ONDANSETRON HCL 4 MG/2ML IJ SOLN
42 MG/2ML | Freq: Four times a day (QID) | INTRAMUSCULAR | Status: DC | PRN
Start: 2022-05-09 — End: 2022-05-12
  Administered 2022-05-09: 22:00:00 4 mg via INTRAVENOUS

## 2022-05-09 MED FILL — ONDANSETRON HCL 4 MG/2ML IJ SOLN: 4 MG/2ML | INTRAMUSCULAR | Qty: 2

## 2022-05-09 MED FILL — PANTOPRAZOLE SODIUM 40 MG IV SOLR: 40 MG | INTRAVENOUS | Qty: 80

## 2022-05-09 MED FILL — PANTOPRAZOLE SODIUM 40 MG IV SOLR: 40 MG | INTRAVENOUS | Qty: 40

## 2022-05-09 NOTE — ED Provider Notes (Cosign Needed)
[facility]  Emergency Department Treatment Report        Patient: Tommy Brown Age: 77 y.o. Sex: male    Date of Birth: March 12, 1945 Admit Date: 05/09/2022 PCP: Cleatis Polka, MD   MRN: [id number]  CSN: [id number]  Dr. Janet Berlin, *   Room: JY78/GN56 Time Dictated: 6:54 PM            Chief Complaint   Chief Complaint   Patient presents with    Rectal Bleeding    Dizziness       History of Present Illness   This is a 77 y.o. male with history of arthritis, coronary artery disease, CABG, hypertension, hyperlipidemia, anemia, pancreatic cancer, post Whipple procedure, kidney stones, prior CT in December 2023 does show diverticulosis, presents today with chief complaint of rectal bleeding.  He states since yesterday morning he has had more than 10 episodes where he feels like he has to have a bowel movement and states the toilet bowl looks like a "slaughterhouse "and is filled with bright red blood.  He does not have any abdominal pain however in the past hour he began to feel slightly dizzy when standing and has had some nausea and vomiting.  He denies dysuria, he states he takes aspirin but is not on blood thinners, he knows that he has had diverticulosis before but does not believe he has had a GI bleed in the past.  He has been anemic and he is concerned about his hemoglobin.  He denies recent fever or upper respiratory complaints, he has no shortness of breath or chest pain.  He has no bleeding from other sites.    Review of Systems   Review of Systems    See HPI  Past Medical/Surgical History     Past Medical History:   Diagnosis Date    Arthritis     Benign prostate hyperplasia     Bladder infection     Chronic kidney disease     Coronary arteriosclerosis     Coronary atherosclerosis of artery bypass graft     Decreased testosterone level     Diverticular disease     Essential hypertension     Gout     History of acute renal failure     History of anemia     History of  malignant neoplasm of pancreas     Hx of CABG     Kidney stone     Kidney stones     Neuropathy     NECK, SHOULDERS    Pancreatic cancer (HCC)     Sepsis (HCC)      Past Surgical History:   Procedure Laterality Date    CABG, ARTERY-VEIN, FOUR  1995    CARDIAC CATHETERIZATION  2006    CERVICAL FUSION      COLONOSCOPY N/A 07/10/2020    DIAGNOSTIC COLONOSCOPY polypectomy w/hot snare, cold snare , Clip x 1  performed by Jonna Coup, MD at [facility]    COLONOSCOPY      CORONARY ARTERY BYPASS GRAFT  1995    LUMBAR DISCECTOMY      OTHER SURGICAL HISTORY  01/01/2011    whipple procedure done for pancreatic cancer    UROLOGICAL SURGERY  2012    Percutaneous extraction of a kidney stone w/ fragmentation procedure        Social History     Social History     Socioeconomic History    Marital status: Married  Spouse name: Not on file    Number of children: Not on file    Years of education: Not on file    Highest education level: Not on file   Occupational History    Not on file   Tobacco Use    Smoking status: Never    Smokeless tobacco: Never   Vaping Use    Vaping Use: Never used   Substance and Sexual Activity    Alcohol use: Not Currently    Drug use: Not Currently    Sexual activity: Defer   Other Topics Concern    Not on file   Social History Narrative    ** Merged History Encounter **          Social Determinants of Health     Financial Resource Strain: Not on file   Food Insecurity: Not on file   Transportation Needs: Not on file   Physical Activity: Not on file   Stress: Not on file   Social Connections: Not on file   Intimate Partner Violence: Not on file   Housing Stability: Not on file     **  Family History     Family History   Problem Relation Age of Onset    Emphysema Brother     Alcohol Abuse Father     Heart Disease Brother      **  Current Medications     Current Facility-Administered Medications   Medication Dose Route Frequency Provider Last Rate Last Admin    ondansetron (ZOFRAN) injection 4 mg  4  mg IntraVENous Q6H PRN Anthonie Lotito V, PA-C   4 mg at 05/09/22 1800    pantoprazole (PROTONIX) 40 mg in sodium chloride 0.9 % 50 mL (mini-bag) infusion  8 mg/hr IntraVENous Continuous Errick Salts V, PA-C 10 mL/hr at 05/09/22 1801 8 mg/hr at 05/09/22 1801    dextrose 5 % and 0.9 % sodium chloride infusion   IntraVENous Continuous Hutchinson, Anne, DO         Current Outpatient Medications   Medication Sig Dispense Refill    tiZANidine HCl (ZANAFLEX PO) 5mg       furosemide (LASIX) 20 MG tablet Furosemide Oral    tablet PRN    active      methocarbamol (ROBAXIN) 500 MG tablet Take 1 tablet by mouth daily as needed      Mouthwashes (BIOTENE) LIQD oral solution USE 15 ML AND SWISH FOR 30 SECONDS THEN SPIT OUT- 4 TO 5 TIMES DAILY AS NEEDED FOR DRY MOUTH (Patient not taking: Reported on 11/15/2021)      acetaminophen (TYLENOL) 325 MG tablet Take 2 tablets by mouth every 6 hours as needed      rosuvastatin (CRESTOR) 20 MG tablet Take 1 tablet by mouth daily      B-D 3CC LUER-LOK SYR 25GX1" 25G X 1" 3 ML MISC  (Patient not taking: Reported on 11/15/2021)      tobramycin-dexamethasone (TOBRADEX) 0.3-0.1 % ophthalmic suspension  (Patient not taking: Reported on 11/15/2021)      allopurinol (ZYLOPRIM) 300 MG tablet Take by mouth daily      amLODIPine (NORVASC) 5 MG tablet Take 1 tablet by mouth daily      aspirin 81 MG chewable tablet Take 1 tablet by mouth daily      bimatoprost (LUMIGAN) 0.01 % SOLN ophthalmic drops Apply 1 drop to eye (Patient not taking: Reported on 02/14/2022)      gabapentin (NEURONTIN) 600 MG tablet Take 1 tablet by mouth  2 times daily.      metoprolol succinate (TOPROL XL) 25 MG extended release tablet TAKE 1 TABLET BY MOUTH EVERY DAY      ondansetron (ZOFRAN-ODT) 4 MG disintegrating tablet Take 1 tablet by mouth every 8 hours as needed      lipase-protease-amylase (CREON) 24000-76000 units delayed release capsule Take 2 capsules by mouth (Patient not taking: Reported on 02/14/2022)       tamsulosin (FLOMAX) 0.4 MG capsule Take 2 capsules by mouth daily      temazepam (RESTORIL) 7.5 MG capsule Take by mouth.      testosterone cypionate (DEPOTESTOTERONE CYPIONATE) 200 MG/ML injection INJECT 0.5 MILLILITER INTO THE MUSCLE EVERY 2 WEEKS DISCARD VIAL AFTER USE         Allergies     Allergies   Allergen Reactions    Codeine Itching and Swelling    Oxycodone Anaphylaxis    Amoxicillin Diarrhea    Hydroxyzine Pamoate Itching and Other (See Comments)     Other reaction(s): Unknown    Meperidine Itching, Other (See Comments), Palpitations and Swelling     Other reaction(s): Unknown    Metoclopramide Itching, Other (See Comments) and Swelling     Other reaction(s): Other (See Comments)  Reaction unknown   Reaction unknown     Morphine Nausea And Vomiting and Other (See Comments)    Oxycodone-Acetaminophen Itching    Prochlorperazine Itching and Other (See Comments)     Other reaction(s): Unknown (comments)  Other reaction(s): Unknown  Unsure of reaction    Hydroxyzine Hcl Hives    Other Other (See Comments)     VISTAID  ANESTHESIA       Physical Exam   Patient Vitals for the past 24 hrs:   Pulse Resp BP SpO2   05/09/22 1801 74 18 111/68 98 %   05/09/22 1731 74 13 (!) 94/52 95 %   05/09/22 1716 76 18 (!) 104/55 96 %   05/09/22 1631 79 22 109/63 97 %     Physical Exam  Vitals and nursing note reviewed.   Constitutional:       Appearance: He is ill-appearing.   HENT:      Head: Normocephalic.      Nose: Nose normal.      Mouth/Throat:      Mouth: Mucous membranes are moist.   Eyes:      Pupils: Pupils are equal, round, and reactive to light.   Cardiovascular:      Rate and Rhythm: Normal rate and regular rhythm.      Pulses: Normal pulses.   Pulmonary:      Effort: Pulmonary effort is normal.      Breath sounds: Normal breath sounds.   Abdominal:      General: There is no distension.      Palpations: Abdomen is soft.      Tenderness: There is no abdominal tenderness.      Comments: Hyperactive bowel sounds  throughout   Genitourinary:     Rectum: Guaiac result positive.      Comments: Patient has maroon-colored blood mixed with slight amount of stool noted on my rectal exam, he is not grossly bleeding from his rectum, he does have some PERI anal hemorrhoids however they are not thrombosed, and not actively bleeding.  Musculoskeletal:         General: Normal range of motion.      Cervical back: Normal range of motion and neck supple.   Skin:  General: Skin is warm.      Capillary Refill: Capillary refill takes less than 2 seconds.   Neurological:      General: No focal deficit present.      Mental Status: He is alert and oriented to person, place, and time.          Impression and Management Plan   77 year old male with greater than 10 episodes of bright red blood per rectum.  Differential includes upper versus lower GI bleed, diverticular bleed, hemorrhoidal bleed, anemia, hemodynamic instability      Diagnostic Studies   Lab:   Results for orders placed or performed during the hospital encounter of 05/09/22   CBC with Auto Differential   Result Value Ref Range    WBC 8.6 4.0 - 11.0 1000/mm3    RBC 3.20 (L) 3.80 - 5.70 M/uL    Hemoglobin 10.6 (L) 13.0 - 18.0 gm/dl    Hematocrit 16.1 (L) 36.0 - 55.0 %    MCV 98.4 (H) 80.0 - 98.0 fL    MCH 33.1 23.0 - 34.6 pg    MCHC 33.7 30.0 - 36.0 gm/dl    Platelets 096 (L) 045 - 450 1000/mm3    MPV 12.2 (H) 6.0 - 10.0 fL    RDW 52.1 (H) 35.1 - 43.9      Nucleated RBCs 0 0 - 0      Immature Granulocytes 0.8 0.0 - 3.0 %    Neutrophils Segmented 69.7 (H) 34 - 64 %    Lymphocytes 21.4 (L) 28 - 48 %    Monocytes 6.9 1 - 13 %    Eosinophils 1.0 0 - 5 %    Basophils 0.2 0 - 3 %   CMP   Result Value Ref Range    Potassium 4.6 3.5 - 5.1 mEq/L    Chloride 105 98 - 107 mEq/L    Sodium 136 136 - 145 mEq/L    CO2 26 20 - 31 mEq/L    Glucose 178 (H) 74 - 106 mg/dl    BUN 35 (H) 9 - 23 mg/dl    Creatinine 4.09 (H) 0.70 - 1.30 mg/dl    GFR African American 47.0      GFR Non-African American 39       Calcium 8.4 (L) 8.7 - 10.4 mg/dl    Anion Gap 5 5 - 15 mmol/L    AST 61.0 (H) 0.0 - 33.9 U/L    ALT 78 (H) 10 - 49 U/L    Alkaline Phosphatase 85 46 - 116 U/L    Total Bilirubin 0.50 0.30 - 1.20 mg/dl    Total Protein 5.7 5.7 - 8.2 gm/dl    Albumin 3.3 (L) 3.4 - 5.0 gm/dl   Protime-INR   Result Value Ref Range    Protime 12.5 10.2 - 12.9 seconds    INR 1.1 0.1 - 1.1     Hemoglobin and Hematocrit   Result Value Ref Range    Hemoglobin 10.0 (L) 13.0 - 18.0 gm/dl    Hematocrit 81.1 (L) 36.0 - 55.0 %   ABO/RH   Result Value Ref Range    ABO/Rh A Rh Negative      Narrative    Specimen is valid for 3 days - nurse to verify valid specimen   ANTIBODY SCREEN   Result Value Ref Range    Antibody Screen NEG      Narrative    Specimen is valid for 3 days - nurse to verify valid  specimen          Cardiac Monitor Interpretation: By my interpretation shows a sinus rhythm with a ventricular rate of 72 bpm, no ectopy.        Other studies:  My interpretation of other studies is that they show, among other things, patient has had a drop in hemoglobin to 10.6 from hemoglobin a month ago of 13.  His hemoglobin dropped from 10.6-10 on recheck over 2 hours.  He has no leukocytosis, his platelets are slightly low, he has a normal PT/INR, he is a negative.    ED Course     ED Course as of 05/09/22 1854   Thu May 09, 2022   1744 Dr. Zane Herald states if he starts bleeding again to order CTA, ABD pelvis.  monitor H&H. IVF. Will see this patient in the am.  [MP]      ED Course User Index  [MP] Django Nguyen, Angus Seller, PA-C        EXTERNAL RECORDS REVIEWED:  I reviewed the patient's previous records here at [facility] and available outside facilities and note that see HPI    PREVIOUS RESULTS REVIEWED: See HPI    INTERPRETATION OF TESTS: See above    INDEPENDENT HISTORIAN:  History and/or plan development assisted by: Hospitalist and Dr. Jonna Coup see note above.    Severe exacerbation or progression of chronic illness: Rectal bleeding      CONDITION  POSING Threat to body FUNCTION, LIFE, OR LIMB without evaluation and management: Rectal bleeding          Comorbidities impacting Evaluation and Management: Diverticulosis          Medications   ondansetron (ZOFRAN) injection 4 mg (4 mg IntraVENous Given 05/09/22 1800)   pantoprazole (PROTONIX) 40 mg in sodium chloride 0.9 % 50 mL (mini-bag) infusion (8 mg/hr IntraVENous New Bag 05/09/22 1801)   dextrose 5 % and 0.9 % sodium chloride infusion (has no administration in time range)   pantoprazole (PROTONIX) 80 mg in sodium chloride (PF) 0.9 % 20 mL injection (80 mg IntraVENous Given 05/09/22 1800)           Medical Decision Making   I have spoken to Dr. Suzie Portela and regarding this patient's care and we discussed CTA if patient has another episode of bleeding, otherwise we will consult in the morning.  I spoke with hospitalist who requested admission to stepdown which I have done, and repeat H&H which is also done.  I do not think antibiotics plays a role at this time, he has had a soft blood pressure, but no further rectal bleeding prior to admission.    Final Diagnosis       ICD-10-CM    1. Rectal bleeding  K62.5       2. Dizziness  R42       3. Nausea and vomiting, unspecified vomiting type  R11.2       4. Anemia due to blood loss  D50.0       5. History of diverticulosis  Z87.19       6. AKI (acute kidney injury) (HCC)  N17.9            Disposition   Admission    The patient was personally evaluated by myself and discussed with Dr. Garnet Koyanagi, [place], * who agrees with the above assessment and plan.    Brett Canales, PA-C  May 09, 2022    My signature above authenticates this document and my orders, the  final    diagnosis (es), discharge prescription (s), and instructions in the Epic    record.  If you have any questions please contact [phone].     Nursing notes have been reviewed by the physician/ advanced practice    Clinician.                             Susanne Borders, PA-C  05/09/22 1855

## 2022-05-09 NOTE — H&P (Signed)
Admission History and Physical  [name]     Patient: Tommy Brown Age: 77 y.o. Sex: male    Date of Birth: 08-23-1945 Admit Date: 05/09/2022 Admit Doctor: No admitting provider for patient encounter.   MRN: [id number]  CSN: [id number]  PCP: Daralene Milch, MD       Assessment / Plan     LGIB:  Symptomatic anemia secondary to ABLA   AKI    Hyperlipidemia  Pancreatic cancer, s/p Whipple  CAD, s/p CABG  BPH  Chronic neuropathy  History Gout  History of diverticulosis        Plan:  -Admit to stepdown for frequency of vital sign checks blood pressure albeit improved on IV fluids still soft therefore holding home hypertension meds including amlodipine metoprolol and diuretic of Bumex  -Hold aspirin due to bleeding, appreciate input from GI patient is known to Dr. Verne Spurr has known history of diverticulosis.  Patient has no abdominal pain or cramping associated with these episodes, I do suspect lower GI bleed sources diverticulosis would be high on the differential.  -Patient does have history of pancreatic cancer was previously on NSAIDs, currently on Protonix drip for now for PPI coverage  -H&H every 6 hours patient's been typed and screened he reports he is amenable to blood transfusion if necessary would transfuse if less than 8 in the setting of his known coronary disease  -Can continue home statin for known coronary disease  -Continue allopurinol for history of gout  -Continue Flomax for BPH  -Monitor renal function, gabapentin's been renally dosed.  Suspect we will see some improvement with IV fluid hydration.  -Continue temazepam nightly as needed for restless legs and neuropathy issues      Diet: N.p.o.  DVT ppx: SCDs      DISPO  -Pt to be admitted  at this time for reasons addressed above, continued hospitalization for ongoing assessment and treatment indicated     Anticipated Date of Discharge: 2-3 midnights  Anticipated Disposition (home, SNF) : Home    Code Status: Full  code    MPOA: Wife [name] updated over phone [phone]      Chief Complaint:   Chief Complaint   Patient presents with    Rectal Bleeding    Dizziness         HPI:   Tommy Brown is a 77 y.o. year old male medical Stru[facility] pancreatic cancer status post Whipple, coronary disease status post CABG hyperlipidemia hypertension diverticulosis BPH.  Patient presents from home today with complaint of blood in stool.  He states that starting yesterday he has had "15-20 times "of bleeding mixed in with his stool.  He states the volume seems so significant that he thinks it is filling the toilet bowl.  Today he was little bit lightheaded dizzy felt weak and fatigued so decided to seek medical attention.  Denies any issue with this before.  Does not currently take NSAIDs but has been on NSAIDs for arthritis and back pain and leg issues in the past.  He states he had a colonoscopy last year with Dr. Verne Spurr.    Lab work today in the ER identifies hemoglobin of 10 which is acute drop from him baseline labs typically hemoglobin between 13-15.  Platelet count also a little low at 124.  AST and ALT mildly elevated.  Patient had evidence of external hemorrhoid on exam that did not seem to be actively bleeding.  GI team consulted.  Blood pressure soft 90-100  systolic.      Review of Systems -   Review of Systems   Constitutional:  Positive for fatigue. Negative for chills and fever.   HENT:  Negative for congestion, sinus pressure and sore throat.    Eyes:  Negative for pain and visual disturbance.   Respiratory:  Negative for cough, shortness of breath and wheezing.    Cardiovascular:  Negative for chest pain, palpitations and leg swelling.   Gastrointestinal:  Positive for anal bleeding and blood in stool. Negative for abdominal pain, nausea and vomiting.   Genitourinary:  Negative for dysuria and frequency.   Musculoskeletal:  Negative for back pain.   Skin:  Negative for color change and rash.   Neurological:  Positive for  dizziness and light-headedness. Negative for weakness and headaches.   Psychiatric/Behavioral:  Negative for dysphoric mood. The patient is not nervous/anxious.            Past Medical History:  Past Medical History:   Diagnosis Date    Arthritis     Benign prostate hyperplasia     Bladder infection     Chronic kidney disease     Coronary arteriosclerosis     Coronary atherosclerosis of artery bypass graft     Decreased testosterone level     Diverticular disease     Essential hypertension     Gout     History of acute renal failure     History of anemia     History of malignant neoplasm of pancreas     Hx of CABG     Kidney stone     Kidney stones     Neuropathy     NECK, SHOULDERS    Pancreatic cancer ([place])     Sepsis ([place])        Past Surgical History:  Past Surgical History:   Procedure Laterality Date    CABG, ARTERY-VEIN, FOUR  1995    CARDIAC CATHETERIZATION  2006    CERVICAL FUSION      COLONOSCOPY N/A 07/10/2020    DIAGNOSTIC COLONOSCOPY polypectomy w/hot snare, cold snare , Clip x 1  performed by Dante Gang, MD at [facility] DISCECTOMY      OTHER SURGICAL HISTORY  01/01/2011    whipple procedure done for pancreatic cancer    UROLOGICAL SURGERY  2012    Percutaneous extraction of a kidney stone w/ fragmentation procedure        Family History:  Family History   Problem Relation Age of Onset    Emphysema Brother     Alcohol Abuse Father     Heart Disease Brother        Social History:  Social History     Socioeconomic History    Marital status: Married   Tobacco Use    Smoking status: Never    Smokeless tobacco: Never   Vaping Use    Vaping Use: Never used   Substance and Sexual Activity    Alcohol use: Not Currently    Drug use: Not Currently    Sexual activity: Defer   Social History Narrative    ** Merged History Encounter **            Home Medications:  Prior to Admission medications    Medication Sig Start Date End Date Taking?  Authorizing Provider   tiZANidine HCl (ZANAFLEX PO) 5mg   [provider]   furosemide (LASIX) 20 MG tablet Furosemide Oral    tablet PRN    active    [provider]   methocarbamol (ROBAXIN) 500 MG tablet Take 1 tablet by mouth daily as needed 08/06/21   [provider]   Mouthwashes Charlene Brooke) LIQD oral solution USE 15 ML AND SWISH FOR 30 SECONDS THEN SPIT OUT- 4 TO 5 TIMES DAILY AS NEEDED FOR DRY MOUTH  Patient not taking: Reported on 11/15/2021 06/14/21   [provider]   acetaminophen (TYLENOL) 325 MG tablet Take 2 tablets by mouth every 6 hours as needed    [provider]   rosuvastatin (CRESTOR) 20 MG tablet Take 1 tablet by mouth daily 05/13/21   [provider]   B-D 3CC LUER-LOK SYR 25GX1" 25G X 1" 3 ML MISC  04/20/21   [provider]   tobramycin-dexamethasone (TOBRADEX) 0.3-0.1 % ophthalmic suspension  05/28/21   [provider]   allopurinol (ZYLOPRIM) 300 MG tablet Take by mouth daily    Automatic Reconciliation, Ar   amLODIPine (NORVASC) 5 MG tablet Take 1 tablet by mouth daily    Automatic Reconciliation, Ar   aspirin 81 MG chewable tablet Take 1 tablet by mouth daily    Automatic Reconciliation, Ar   bimatoprost (LUMIGAN) 0.01 % SOLN ophthalmic drops Apply 1 drop to eye  Patient not taking: Reported on 02/14/2022    Automatic Reconciliation, Ar   gabapentin (NEURONTIN) 600 MG tablet Take 1 tablet by mouth 2 times daily.    Automatic Reconciliation, Ar   metoprolol succinate (TOPROL XL) 25 MG extended release tablet TAKE 1 TABLET BY MOUTH EVERY DAY 07/21/18   Automatic Reconciliation, Ar   ondansetron (ZOFRAN-ODT) 4 MG disintegrating tablet Take 1 tablet by mouth every 8 hours as needed 02/17/20   Automatic Reconciliation, Ar   lipase-protease-amylase (CREON) 24000-76000 units delayed release capsule Take 2 capsules by mouth  Patient not taking: Reported on 02/14/2022    Automatic Reconciliation, Ar   tamsulosin (FLOMAX) 0.4 MG  capsule Take 2 capsules by mouth daily 07/17/18   Automatic Reconciliation, Ar   temazepam (RESTORIL) 7.5 MG capsule Take by mouth.    Automatic Reconciliation, Ar   testosterone cypionate (DEPOTESTOTERONE CYPIONATE) 200 MG/ML injection INJECT 0.5 MILLILITER INTO THE MUSCLE EVERY 2 WEEKS DISCARD VIAL AFTER USE 11/16/18   Automatic Reconciliation, Ar       Allergies:  Allergies   Allergen Reactions    Codeine Itching and Swelling    Oxycodone Anaphylaxis    Amoxicillin Diarrhea    Hydroxyzine Pamoate Itching and Other (See Comments)     Other reaction(s): Unknown    Meperidine Itching, Other (See Comments), Palpitations and Swelling     Other reaction(s): Unknown    Metoclopramide Itching, Other (See Comments) and Swelling     Other reaction(s): Other (See Comments)  Reaction unknown   Reaction unknown     Morphine Nausea And Vomiting and Other (See Comments)    Oxycodone-Acetaminophen Itching    Prochlorperazine Itching and Other (See Comments)     Other reaction(s): Unknown (comments)  Other reaction(s): Unknown  Unsure of reaction    Hydroxyzine Hcl Hives    Other Other (See Comments)     VISTAID  ANESTHESIA         Physical Exam:   Visit Vitals  BP 111/68   Pulse 74   Resp 18   SpO2 98%     Physical Exam  Constitutional:  General: He is awake.      Appearance: Normal appearance.   HENT:      Head: Normocephalic and atraumatic.      Mouth/Throat:      Mouth: Mucous membranes are dry.   Eyes:      General: Lids are normal.      Pupils: Pupils are equal, round, and reactive to light.   Cardiovascular:      Rate and Rhythm: Normal rate and regular rhythm.      Heart sounds: Normal heart sounds, S1 normal and S2 normal.   Pulmonary:      Effort: Pulmonary effort is normal.      Breath sounds: Normal breath sounds.   Abdominal:      General: Abdomen is flat. Bowel sounds are normal.      Palpations: Abdomen is soft.      Tenderness: There is no abdominal tenderness.   Musculoskeletal:         General: Normal range  of motion.      Cervical back: Neck supple.      Right lower leg: No edema.      Left lower leg: No edema.   Skin:     General: Skin is warm and dry.      Capillary Refill: Capillary refill takes less than 2 seconds.   Neurological:      General: No focal deficit present.      Mental Status: He is alert and oriented to person, place, and time.   Psychiatric:         Attention and Perception: Attention normal.         Mood and Affect: Mood normal.         Behavior: Behavior is cooperative.          Intake and Output:  Current Shift:  No intake/output data recorded.  Last three shifts:  No intake/output data recorded.    Lab/Data Reviewed:  Recent Results (from the past 24 hour(s))   CBC with Auto Differential    Collection Time: 05/09/22  4:40 PM   Result Value Ref Range    WBC 8.6 4.0 - 11.0 1000/mm3    RBC 3.20 (L) 3.80 - 5.70 M/uL    Hemoglobin 10.6 (L) 13.0 - 18.0 gm/dl    Hematocrit 31.5 (L) 36.0 - 55.0 %    MCV 98.4 (H) 80.0 - 98.0 fL    MCH 33.1 23.0 - 34.6 pg    MCHC 33.7 30.0 - 36.0 gm/dl    Platelets 124 (L) 140 - 450 1000/mm3    MPV 12.2 (H) 6.0 - 10.0 fL    RDW 52.1 (H) 35.1 - 43.9      Nucleated RBCs 0 0 - 0      Immature Granulocytes 0.8 0.0 - 3.0 %    Neutrophils Segmented 69.7 (H) 34 - 64 %    Lymphocytes 21.4 (L) 28 - 48 %    Monocytes 6.9 1 - 13 %    Eosinophils 1.0 0 - 5 %    Basophils 0.2 0 - 3 %   CMP    Collection Time: 05/09/22  4:40 PM   Result Value Ref Range    Potassium 4.6 3.5 - 5.1 mEq/L    Chloride 105 98 - 107 mEq/L    Sodium 136 136 - 145 mEq/L    CO2 26 20 - 31 mEq/L    Glucose 178 (H) 74 - 106 mg/dl    BUN 35 (H)  9 - 23 mg/dl    Creatinine 1.79 (H) 0.70 - 1.30 mg/dl    GFR African American 47.0      GFR Non-African American 39      Calcium 8.4 (L) 8.7 - 10.4 mg/dl    Anion Gap 5 5 - 15 mmol/L    AST 61.0 (H) 0.0 - 33.9 U/L    ALT 78 (H) 10 - 49 U/L    Alkaline Phosphatase 85 46 - 116 U/L    Total Bilirubin 0.50 0.30 - 1.20 mg/dl    Total Protein 5.7 5.7 - 8.2 gm/dl    Albumin 3.3 (L)  3.4 - 5.0 gm/dl   Protime-INR    Collection Time: 05/09/22  4:40 PM   Result Value Ref Range    Protime 12.5 10.2 - 12.9 seconds    INR 1.1 0.1 - 1.1     ABO/RH    Collection Time: 05/09/22  4:40 PM   Result Value Ref Range    ABO/Rh A Rh Negative             - I independently reviewed the imaging studies done on the patient and agree with interpretation done by the radiologist and reviewed available blood work from this presentation  -I discussed the patient with the emergency room physician about the necessity to admit the patient to the hospital  -RECORDS REVIEWED: I reviewed patient's history previous chart and laboratory results during this admission while making the diagnosis and plan for admission to the hospital- this included but not limited to available notes in [place] epic, [facility] system, outpatient PCP records     The total Critical Care time caring for this patient with a life threatening illness is 45 minutes.  This includes direct patient care, reviewing medical records, evaluating results of diagnostic testing, discussions with family members, and consulting with physicians.           Jonnie Kind, DO    May 09, 2022  6:07 PM    Dragon medical dictation software was used for portions of this report.  Unintended voice transcription errors may have occurred.

## 2022-05-09 NOTE — ED Notes (Signed)
Pt c/o leg cramping and wants medicine for it.  Will notified the doctor.     Laqueta Due May, [place]  05/09/22 2128

## 2022-05-09 NOTE — ED Notes (Signed)
Received report from [place], Bosco Paparella May, [place]  05/09/22 1950

## 2022-05-09 NOTE — ED Notes (Signed)
Pt given urinal.  Call bell within reach.     Laqueta Due May, [place]  05/09/22 2128

## 2022-05-10 LAB — COMPREHENSIVE METABOLIC PANEL W/ REFLEX TO MG FOR LOW K
ALT: 62 U/L — ABNORMAL HIGH (ref 10–49)
AST: 42 U/L — ABNORMAL HIGH (ref 0.0–33.9)
Albumin: 2.8 gm/dl — ABNORMAL LOW (ref 3.4–5.0)
Alkaline Phosphatase: 70 U/L (ref 46–116)
Anion Gap: 2 mmol/L — ABNORMAL LOW (ref 5–15)
BUN: 33 mg/dl — ABNORMAL HIGH (ref 9–23)
CO2: 27 mEq/L (ref 20–31)
Calcium: 7.9 mg/dl — ABNORMAL LOW (ref 8.7–10.4)
Chloride: 108 mEq/L — ABNORMAL HIGH (ref 98–107)
Creatinine: 1.58 mg/dl — ABNORMAL HIGH (ref 0.70–1.30)
Glucose: 114 mg/dl — ABNORMAL HIGH (ref 74–106)
Potassium: 4.7 mEq/L (ref 3.5–5.1)
Sodium: 137 mEq/L (ref 136–145)
Total Bilirubin: 0.4 mg/dl (ref 0.30–1.20)
Total Protein: 4.8 gm/dl — ABNORMAL LOW (ref 5.7–8.2)

## 2022-05-10 LAB — HEMOGLOBIN AND HEMATOCRIT
Hematocrit: 26.4 % — ABNORMAL LOW (ref 36.0–55.0)
Hematocrit: 26.4 % — ABNORMAL LOW (ref 36.0–55.0)
Hematocrit: 25.2 % — ABNORMAL LOW (ref 36.0–55.0)
Hemoglobin: 8.8 gm/dl — ABNORMAL LOW (ref 13.0–18.0)
Hemoglobin: 8.8 gm/dl — ABNORMAL LOW (ref 13.0–18.0)
Hemoglobin: 8.2 gm/dl — ABNORMAL LOW (ref 13.0–18.0)

## 2022-05-10 LAB — FERRITIN: Ferritin: 156.3 ng/ml (ref 10.5–307.3)

## 2022-05-10 LAB — PROTIME-INR
INR: 1.1 (ref 0.1–1.1)
Protime: 12.6 seconds (ref 10.2–12.9)

## 2022-05-10 LAB — CBC WITH AUTO DIFFERENTIAL
Basophils: 0.3 % (ref 0–3)
Eosinophils: 1.4 % (ref 0–5)
Hematocrit: 26.4 % — ABNORMAL LOW (ref 36.0–55.0)
Hemoglobin: 8.8 gm/dl — ABNORMAL LOW (ref 13.0–18.0)
Immature Granulocytes %: 0.6 % (ref 0.0–3.0)
Lymphocytes: 22.2 % — ABNORMAL LOW (ref 28–48)
MCH: 32.2 pg (ref 23.0–34.6)
MCHC: 33.3 gm/dl (ref 30.0–36.0)
MCV: 96.7 fL (ref 80.0–98.0)
MPV: 11.2 fL — ABNORMAL HIGH (ref 6.0–10.0)
Monocytes: 7 % (ref 1–13)
Neutrophils Segmented: 68.5 % — ABNORMAL HIGH (ref 34–64)
Nucleated RBCs: 0 (ref 0–0)
Platelets: 100 10*3/uL — ABNORMAL LOW (ref 140–450)
RBC: 2.73 M/uL — ABNORMAL LOW (ref 3.80–5.70)
RDW: 51.8 — ABNORMAL HIGH (ref 35.1–43.9)
WBC: 7.9 10*3/uL (ref 4.0–11.0)

## 2022-05-10 LAB — IRON AND TIBC
% SATURATION: 27 % (ref 20–45)
Iron: 66 ug/dL (ref 65–175)
TIBC: 244 ug/dL — ABNORMAL LOW (ref 250–425)

## 2022-05-10 MED ORDER — METOPROLOL SUCCINATE ER 25 MG PO TB24
25 MG | Freq: Every day | ORAL | Status: DC
Start: 2022-05-10 — End: 2022-05-12

## 2022-05-10 MED ORDER — ALLOPURINOL 100 MG PO TABS
100 MG | Freq: Every day | ORAL | Status: DC
Start: 2022-05-10 — End: 2022-05-12
  Administered 2022-05-10 – 2022-05-12 (×3): 300 mg via ORAL

## 2022-05-10 MED ORDER — NORMAL SALINE FLUSH 0.9 % IV SOLN
0.9 % | Freq: Two times a day (BID) | INTRAVENOUS | Status: DC
Start: 2022-05-10 — End: 2022-05-12
  Administered 2022-05-10 – 2022-05-12 (×5): 10 mL via INTRAVENOUS

## 2022-05-10 MED ORDER — ACETAMINOPHEN 325 MG PO TABS
325 MG | Freq: Four times a day (QID) | ORAL | Status: DC | PRN
Start: 2022-05-10 — End: 2022-05-12
  Administered 2022-05-10 (×2): 650 mg via ORAL

## 2022-05-10 MED ORDER — NORMAL SALINE FLUSH 0.9 % IV SOLN
0.9 % | INTRAVENOUS | Status: DC | PRN
Start: 2022-05-10 — End: 2022-05-12

## 2022-05-10 MED ORDER — SODIUM CHLORIDE 0.9 % IV SOLN
0.9 % | INTRAVENOUS | Status: DC | PRN
Start: 2022-05-10 — End: 2022-05-12

## 2022-05-10 MED ORDER — BUMETANIDE 1 MG PO TABS
1 MG | Freq: Every day | ORAL | Status: DC
Start: 2022-05-10 — End: 2022-05-12

## 2022-05-10 MED ORDER — TAMSULOSIN HCL 0.4 MG PO CAPS
0.4 MG | Freq: Every day | ORAL | Status: DC
Start: 2022-05-10 — End: 2022-05-12
  Administered 2022-05-10 – 2022-05-12 (×3): 0.8 mg via ORAL

## 2022-05-10 MED ORDER — ACETAMINOPHEN 650 MG RE SUPP
650 MG | Freq: Four times a day (QID) | RECTAL | Status: DC | PRN
Start: 2022-05-10 — End: 2022-05-12

## 2022-05-10 MED ORDER — PRAVASTATIN SODIUM 10 MG PO TABS
10 MG | Freq: Every day | ORAL | Status: DC
Start: 2022-05-10 — End: 2022-05-12

## 2022-05-10 MED ORDER — TEMAZEPAM 7.5 MG PO CAPS
7.5 MG | Freq: Every evening | ORAL | Status: DC | PRN
Start: 2022-05-10 — End: 2022-05-12
  Administered 2022-05-10 – 2022-05-12 (×3): 7.5 mg via ORAL

## 2022-05-10 MED ORDER — AMLODIPINE BESYLATE 5 MG PO TABS
5 MG | Freq: Every day | ORAL | Status: DC
Start: 2022-05-10 — End: 2022-05-12

## 2022-05-10 MED ORDER — GABAPENTIN 300 MG PO CAPS
300 MG | Freq: Two times a day (BID) | ORAL | Status: DC
Start: 2022-05-10 — End: 2022-05-12
  Administered 2022-05-10 – 2022-05-12 (×6): 600 mg via ORAL

## 2022-05-10 MED ORDER — ASPIRIN 81 MG PO CHEW
81 MG | Freq: Every day | ORAL | Status: DC
Start: 2022-05-10 — End: 2022-05-12
  Administered 2022-05-12: 13:00:00 81 mg via ORAL

## 2022-05-10 MED FILL — PANTOPRAZOLE SODIUM 40 MG IV SOLR: 40 MG | INTRAVENOUS | Qty: 40

## 2022-05-10 MED FILL — GABAPENTIN 300 MG PO CAPS: 300 MG | ORAL | Qty: 2

## 2022-05-10 MED FILL — ACETAMINOPHEN 325 MG PO TABS: 325 MG | ORAL | Qty: 2

## 2022-05-10 MED FILL — TEMAZEPAM 7.5 MG PO CAPS: 7.5 MG | ORAL | Qty: 1

## 2022-05-10 MED FILL — DEXTROSE-NACL 5-0.9 % IV SOLN: INTRAVENOUS | Qty: 1000

## 2022-05-10 MED FILL — ALLOPURINOL 100 MG PO TABS: 100 MG | ORAL | Qty: 3

## 2022-05-10 MED FILL — TAMSULOSIN HCL 0.4 MG PO CAPS: 0.4 MG | ORAL | Qty: 2

## 2022-05-10 NOTE — ED Notes (Signed)
Report given to Ursula Beath, RN  05/10/22 1350

## 2022-05-10 NOTE — Progress Notes (Signed)
Hospitalist Progress Note  [facility]    Daily Progress Note: 05/10/2022    Assessment/Plan:     LGIB, probably diverticular bleed  ABLA  AKI Cr 1.79-->1.58  HLD  Pancreatic CA s/p Whipple  CAD s/p CABG  BPH w/ LUTS  Chronic neuropathy  Gout  Diverticulosis    Plan:    GI (Dr Jimmye Norman) following, no plans for repeat colonoscopy. If he re-bleeds, GI recommends CTA abdomen to look for active bleeding  Transfuse PRBC for Hb <7, Iron level ok  BP meds on hold  Continue gabapentin  Continue Flomax 0.4mg  daily    DVT Prophylaxis  SCDs    Code status. Full Code    Disposition  Monitoring for continued bleed  Transfuse PRBC as needed    ------------------     Chart reviewed including notes, labs, imaging, studies     D/w floor RN regarding current POC and pt's condition     D/w pt and family regarding pt's condition and current POC    ------------------  Physical exam:  General:  Alert, NAD, pleasant.  Cooperative  HEENT: Sclera anicteric, PERRL, OM moist, throat clear.  Chest: no wheeze, no rales, no rhonchi.  Fair air movement.  CV: RRR, S1/S2 were normal, no murmur  Abdomen: NTND, soft, NABS, no masses were noted.  No rebound, no guarding.  Extremities: No edema.    Subjective:     Pt said he feels ok, we discussed meds and transfusion if he continues to bleed and a CTA abd if he bleeds a lot again    Objective:     BP 124/62   Pulse 69   Temp 97.2 F (36.2 C) (Temporal)   Resp 18   Ht 1.676 m (5\' 6" )   Wt 63.5 kg (140 lb)   SpO2 96%   BMI 22.60 kg/m         Temp (24hrs), Avg:97.8 F (36.6 C), Min:97.2 F (36.2 C), Max:98.1 F (36.7 C)    No intake/output data recorded.   03/14 0701 - 03/15 1900  In: -   Out: [id number] [Urine:1400]      Data Review:       Recent Days:  Recent Labs     05/09/22  1640 05/09/22  1820 05/10/22  0240 05/10/22  0800 05/10/22  1400 05/10/22  1839   WBC 8.6  --  7.9  --   --   --    HGB 10.6*   < > 8.8* 8.8* 8.8* 8.2*   HCT 31.5*   < > 26.4* 26.4* 26.4* 25.2*   PLT 124*  --   100*  --   --   --     < > = values in this interval not displayed.     Recent Labs     05/09/22  1640 05/10/22  0240   NA 136 137   K 4.6 4.7   CL 105 108*   CO2 26 27   BUN 35* 33*   ALT 78* 62*   INR 1.1 1.1     No results for input(s): "PH", "PCO2", "PO2", "HCO3", "FIO2" in the last 72 hours.    24 Hour Results:  Recent Results (from the past 24 hour(s))   CBC with Auto Differential    Collection Time: 05/10/22  2:40 AM   Result Value Ref Range    WBC 7.9 4.0 - 11.0 1000/mm3    RBC 2.73 (L) 3.80 - 5.70 M/uL    Hemoglobin 8.8 (  L) 13.0 - 18.0 gm/dl    Hematocrit 26.4 (L) 36.0 - 55.0 %    MCV 96.7 80.0 - 98.0 fL    MCH 32.2 23.0 - 34.6 pg    MCHC 33.3 30.0 - 36.0 gm/dl    Platelets 100 (L) 140 - 450 1000/mm3    MPV 11.2 (H) 6.0 - 10.0 fL    RDW 51.8 (H) 35.1 - 43.9      Nucleated RBCs 0 0 - 0      Immature Granulocytes 0.6 0.0 - 3.0 %    Neutrophils Segmented 68.5 (H) 34 - 64 %    Lymphocytes 22.2 (L) 28 - 48 %    Monocytes 7.0 1 - 13 %    Eosinophils 1.4 0 - 5 %    Basophils 0.3 0 - 3 %   Protime-INR    Collection Time: 05/10/22  2:40 AM   Result Value Ref Range    Protime 12.6 10.2 - 12.9 seconds    INR 1.1 0.1 - 1.1     Comprehensive Metabolic Panel w/ Reflex to MG    Collection Time: 05/10/22  2:40 AM   Result Value Ref Range    Potassium 4.7 3.5 - 5.1 mEq/L    Chloride 108 (H) 98 - 107 mEq/L    Sodium 137 136 - 145 mEq/L    CO2 27 20 - 31 mEq/L    Glucose 114 (H) 74 - 106 mg/dl    BUN 33 (H) 9 - 23 mg/dl    Creatinine 1.58 (H) 0.70 - 1.30 mg/dl    Calcium 7.9 (L) 8.7 - 10.4 mg/dl    Anion Gap 2 (L) 5 - 15 mmol/L    AST 42.0 (H) 0.0 - 33.9 U/L    ALT 62 (H) 10 - 49 U/L    Alkaline Phosphatase 70 46 - 116 U/L    Total Bilirubin 0.40 0.30 - 1.20 mg/dl    Total Protein 4.8 (L) 5.7 - 8.2 gm/dl    Albumin 2.8 (L) 3.4 - 5.0 gm/dl   Ferritin    Collection Time: 05/10/22  2:40 AM   Result Value Ref Range    Ferritin 156.3 10.5 - 307.3 ng/ml   Iron and TIBC    Collection Time: 05/10/22  2:40 AM   Result Value Ref Range     Iron 66 65 - 175 mcg/dl    TIBC 244 (L) 250 - 425 mcg/dl    % SATURATION 27 20 - 45 %   Hemoglobin and Hematocrit    Collection Time: 05/10/22  8:00 AM   Result Value Ref Range    Hemoglobin 8.8 (L) 13.0 - 18.0 gm/dl    Hematocrit 26.4 (L) 36.0 - 55.0 %   Hemoglobin and Hematocrit    Collection Time: 05/10/22  2:00 PM   Result Value Ref Range    Hemoglobin 8.8 (L) 13.0 - 18.0 gm/dl    Hematocrit 26.4 (L) 36.0 - 55.0 %   Hemoglobin and Hematocrit    Collection Time: 05/10/22  6:39 PM   Result Value Ref Range    Hemoglobin 8.2 (L) 13.0 - 18.0 gm/dl    Hematocrit 25.2 (L) 36.0 - 55.0 %       No results found.    Microbiology:  @MICRORESULTS @     Cardiology:  @LASTCARDIOLOGY @      Problem List:      Medications reviewed  Current Facility-Administered Medications   Medication Dose Route Frequency    ondansetron (ZOFRAN)  injection 4 mg  4 mg IntraVENous Q6H PRN    pantoprazole (PROTONIX) 40 mg in sodium chloride 0.9 % 50 mL (mini-bag) infusion  8 mg/hr IntraVENous Continuous    dextrose 5 % and 0.9 % sodium chloride infusion   IntraVENous Continuous    allopurinol (ZYLOPRIM) tablet 300 mg  300 mg Oral Daily    [Held by provider] amLODIPine (NORVASC) tablet 5 mg  5 mg Oral Daily    [Held by provider] aspirin chewable tablet 81 mg  81 mg Oral Daily    [Held by provider] bumetanide (BUMEX) tablet 1 mg  1 mg Oral Daily    gabapentin (NEURONTIN) capsule 600 mg  600 mg Oral BID    [Held by provider] metoprolol succinate (TOPROL XL) extended release tablet 25 mg  25 mg Oral Daily    [Held by provider] pravastatin (PRAVACHOL) tablet 10 mg  10 mg Oral Daily    tamsulosin (FLOMAX) capsule 0.8 mg  0.8 mg Oral Daily    temazepam (RESTORIL) capsule 7.5 mg  7.5 mg Oral Nightly PRN    sodium chloride flush 0.9 % injection 5-40 mL  5-40 mL IntraVENous 2 times per day    sodium chloride flush 0.9 % injection 5-40 mL  5-40 mL IntraVENous PRN    0.9 % sodium chloride infusion   IntraVENous PRN    acetaminophen (TYLENOL) tablet 650 mg   650 mg Oral Q6H PRN    Or    acetaminophen (TYLENOL) suppository 650 mg  650 mg Rectal Q6H PRN         Kristen Loader, MD

## 2022-05-10 NOTE — ED Notes (Signed)
Bedside shift report given to [facility], Abdulraheem Pineo May, [place]  05/10/22 [phone]

## 2022-05-10 NOTE — Progress Notes (Signed)
Rounded on pt. Introduced self. Call bell in reach. Visitor at bedside. No concern or need expressed at this time.

## 2022-05-10 NOTE — ED Notes (Signed)
Pt is now awake and watching tv.  Urinal emptied. 500 cc output.     Tommy Brown, [place]  05/10/22 [phone]

## 2022-05-10 NOTE — Consults (Signed)
Consult Note    Patient:  Tommy Brown  Date of Birth:  10/08/1945  Age: 77 y.o.  Sex: male  PCP: Daralene Milch, MD  MRN: [id number]        IMPRESSION:   GIB--painless hematochezia, suspect diverticular, consider brisk upper.   Acute Anemia s/2 #1  Pancreatic cancer s/p Whipple  AKI  HTN/HLD  CAD s/p CABG  BPH  Chronic neuropathy  Gout      EGD/CS, Dr. Lucien Mons 06/2020:  E/o prior surgery, o/w nml, esophageal bx unremarkable  Diverticulosis, polyps(adenomatous polyps), IH  Patient Active Problem List    Diagnosis Date Noted    Rectal bleeding 05/09/2022    Lower GI bleed 05/09/2022    Hypertensive retinopathy, bilateral 11/17/2019    Vitreous degeneration, left eye 11/17/2019    Puckering of macula, right eye 11/17/2019    Tributary (branch) retinal vein occlusion, right eye, stable 11/17/2019    Acute chest pain 07/21/2019    History of coronary artery disease 07/21/2019    Chest pain 05/03/2019    Palpitations 05/03/2019    Cervical disc disease 05/03/2019    Carotid artery disease ([place]) 05/03/2019    Dyslipidemia 05/03/2019    Ejection fraction < 50% 05/03/2019    Preop cardiovascular exam 05/03/2019    Pneumonia due to COVID-19 virus 01/10/2019    UTI (urinary tract infection) 07/15/2018    AKI (acute kidney injury) ([place]) [id number]    Metabolic acidosis [id number]    Sepsis ([place]) 01/17/2018    Testosterone deficiency 01/15/2017    AR (allergic rhinitis) 01/15/2017    Gout 01/15/2017    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    Liver cyst 07/11/2016    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    Murmur, cardiac 01/03/2015    Port-A-Cath in place 02/01/2013    Syncope 01/05/2013    Atherosclerosis of coronary artery 01/05/2013    H/O neck surgery 01/05/2013    Pneumonia 01/05/2013    BPH (benign prostatic hyperplasia) 01/05/2013    Seasonal allergies 01/05/2013    Hypercholesteremia 01/05/2013    Diarrhea 01/05/2013    History of syncope 09/05/2011    Steatorrhea 02/28/2011    Bacteremia due to Klebsiella  pneumoniae 01/23/2011    Malignant neoplasm of head of pancreas ([place]) 01/21/2011    Clostridium difficile diarrhea 01/10/2011    Essential hypertension 12/21/2010    Coronary artery disease (CAD) excluded 12/21/2010    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 Tonye Royalty to evaluate Tommy Brown for GIB. He  is a 77 y.o. male admitted on 05/09/2022 for Rectal bleeding [K62.5]  Lower GI bleed [K92.2].    PMH as listed above, presented to ER w/ reports of blood in stools. Bleeding started wed am, mult that day then up to 20 stools mixed w/ blood, lg volume on TH. Day of presentation to ER he reported feeling lightheaded, fatigue. No abd pain, n/v.Denies NSAID use currently x daily ASA but had taken in past. Not on blood thinners.  Denies ETOH, smoking. No hx GI bleeding or diverticulitis. Works at [employer].  ER labs revealed h/h 10.6/31.5, compared to previous 09/2020 of 15/46. H/h this am 8.8/26.4   Transaminase elevation: AST 61 > 42, ALT 78 > 62. AP & Tbili nml. Alb 2.8. wbc nml, plt 100  ER rectal exam: maroon colored blood mixed w/ small amt stool. not grossly bleeding from his rectum, he does have some PERI anal hemorrhoids however they are not thrombosed, and not actively bleeding.     Allergies   Allergen Reactions    Codeine Itching and Swelling    Oxycodone Anaphylaxis    Amoxicillin Diarrhea    Hydroxyzine Pamoate Itching and Other (See Comments)     Other reaction(s): Unknown    Meperidine Itching, Other (See Comments), Palpitations and Swelling     Other reaction(s): Unknown    Metoclopramide Itching, Other (See Comments) and Swelling     Other reaction(s): Other (See Comments)  Reaction  unknown   Reaction unknown     Morphine Nausea And Vomiting and Other (See Comments)    Oxycodone-Acetaminophen Itching    Prochlorperazine Itching and Other (See Comments)     Other reaction(s): Unknown (comments)  Other reaction(s): Unknown  Unsure of reaction    Hydroxyzine Hcl Hives    Other Other (See Comments)     VISTAID  ANESTHESIA       Prior to Admission medications    Medication Sig Start Date End Date Taking? Authorizing Provider   pravastatin (PRAVACHOL) 10 MG tablet Take 1 tablet by mouth daily   Yes [provider]   bumetanide (BUMEX) 1 MG tablet Take 1 tablet by mouth daily   Yes [provider]   meloxicam (MOBIC) 7.5 MG tablet Take 1 tablet by mouth daily   Yes [provider]   omeprazole (PRILOSEC) 40 MG delayed release capsule Take 1 capsule by mouth daily   Yes [provider]   methocarbamol (ROBAXIN) 500 MG tablet Take 1 tablet by mouth daily as needed 08/06/21  Yes [provider]   acetaminophen (TYLENOL) 325 MG tablet Take 2 tablets by mouth every 6 hours as needed   Yes [provider]   allopurinol (ZYLOPRIM) 300 MG tablet Take 1 tablet by mouth daily   Yes Automatic Reconciliation, Ar   amLODIPine (NORVASC) 5 MG tablet Take 1 tablet by mouth daily   Yes Automatic Reconciliation, Ar   aspirin 81 MG chewable tablet Take 1 tablet by mouth daily   Yes Automatic Reconciliation, Ar   gabapentin (NEURONTIN) 600 MG tablet Take 2 tablets by mouth 2 times daily.   Yes Automatic Reconciliation, Ar   metoprolol succinate (TOPROL XL) 25 MG extended release tablet TAKE 1 TABLET BY MOUTH EVERY DAY 07/21/18  Yes Automatic Reconciliation, Ar   lipase-protease-amylase (CREON) 24000-76000 units delayed release capsule Take 2 capsules by mouth 3 times daily (with meals)   Yes Automatic Reconciliation, Ar   tamsulosin (FLOMAX) 0.4 MG capsule Take 2 capsules by mouth daily 07/17/18  Yes Automatic Reconciliation, Ar   temazepam (RESTORIL) 7.5 MG capsule Take  2 capsules by mouth nightly as needed for Anxiety or Sleep.   Yes Automatic Reconciliation, Ar        Current Facility-Administered Medications   Medication Dose Route Frequency Provider Last Rate Last Admin    ondansetron (ZOFRAN) injection 4 mg  4 mg IntraVENous Q6H PRN Hutchinson, Anne, DO   4 mg at 05/09/22 1800    pantoprazole (PROTONIX) 40 mg in sodium chloride 0.9 % 50 mL (mini-bag) infusion  8 mg/hr IntraVENous Continuous Jonnie Kind, DO 10 mL/hr at 05/10/22 0604 8 mg/hr at 05/10/22 0604    dextrose 5 % and 0.9 % sodium chloride infusion   IntraVENous Continuous Jonnie Kind, DO 100 mL/hr at 05/09/22 1906 New Bag at 05/09/22 1906    allopurinol (ZYLOPRIM) tablet 300 mg  300 mg Oral Daily Hutchinson, Anne, DO        [Held by provider] amLODIPine (NORVASC) tablet 5 mg  5 mg Oral Daily Hutchinson, Anne, DO        [Held by provider] aspirin chewable tablet 81 mg  81 mg Oral Daily Hutchinson, Anne, DO        [Held by provider] bumetanide (BUMEX) tablet 1 mg  1 mg Oral Daily Hutchinson, Anne, DO        gabapentin (NEURONTIN) capsule 600 mg  600 mg Oral BID Hutchinson, Anne, DO   600 mg at 05/09/22 2300    [Held by provider] metoprolol succinate (TOPROL XL) extended release tablet 25 mg  25 mg Oral Daily Hutchinson, Anne, DO        [Held by provider] pravastatin (PRAVACHOL) tablet 10 mg  10 mg Oral Daily Hutchinson, Anne, DO        tamsulosin (FLOMAX) capsule 0.8 mg  0.8 mg Oral Daily Hutchinson, Anne, DO        temazepam (RESTORIL) capsule 7.5 mg  7.5 mg Oral Nightly PRN Jonnie Kind, DO   7.5 mg at 05/09/22 2157    sodium chloride flush 0.9 % injection 5-40 mL  5-40 mL IntraVENous 2 times per day Jonnie Kind, DO   10 mL at 05/10/22 0042    sodium chloride flush 0.9 % injection 5-40 mL  5-40 mL IntraVENous PRN Hutchinson, Anne, DO        0.9 % sodium chloride infusion   IntraVENous PRN Hutchinson, Anne, DO        acetaminophen (TYLENOL) tablet 650 mg  650 mg Oral Q6H PRN Jonnie Kind, DO    650 mg at 05/09/22 2300    Or    acetaminophen (TYLENOL) suppository 650 mg  650 mg Rectal Q6H PRN Jonnie Kind, DO         Current Outpatient Medications   Medication Sig Dispense Refill    pravastatin (PRAVACHOL) 10 MG tablet Take 1 tablet by mouth daily      bumetanide (BUMEX) 1 MG tablet Take 1 tablet by mouth daily      meloxicam (MOBIC) 7.5 MG tablet Take 1 tablet by mouth daily      omeprazole (PRILOSEC) 40 MG delayed release capsule Take 1 capsule by mouth daily      methocarbamol (ROBAXIN) 500 MG tablet Take 1 tablet by mouth daily as needed      acetaminophen (TYLENOL) 325 MG tablet Take 2 tablets by mouth every 6 hours as needed      allopurinol (ZYLOPRIM) 300 MG tablet Take 1 tablet by mouth daily      amLODIPine (NORVASC) 5 MG tablet Take 1 tablet by mouth daily      aspirin 81 MG chewable tablet Take 1 tablet by mouth daily      gabapentin (NEURONTIN) 600 MG tablet Take 2 tablets by mouth 2 times daily.      metoprolol succinate (TOPROL XL) 25 MG extended release tablet TAKE 1 TABLET BY MOUTH EVERY DAY      lipase-protease-amylase (CREON) 24000-76000 units delayed release capsule Take 2 capsules by mouth 3 times daily (with meals)  tamsulosin (FLOMAX) 0.4 MG capsule Take 2 capsules by mouth daily      temazepam (RESTORIL) 7.5 MG capsule Take 2 capsules by mouth nightly as needed for Anxiety or Sleep.          Past Medical History:   Diagnosis Date    Arthritis     Benign prostate hyperplasia     Bladder infection     Chronic kidney disease     Coronary arteriosclerosis     Coronary atherosclerosis of artery bypass graft     Decreased testosterone level     Diverticular disease     Essential hypertension     Gout     History of acute renal failure     History of anemia     History of malignant neoplasm of pancreas     Hx of CABG     Kidney stone     Kidney stones     Neuropathy     NECK, SHOULDERS    Pancreatic cancer ([place])     Sepsis ([place])        Past Surgical History:   Procedure Laterality  Date    CABG, ARTERY-VEIN, FOUR  1995    CARDIAC CATHETERIZATION  2006    CERVICAL FUSION      COLONOSCOPY N/A 07/10/2020    DIAGNOSTIC COLONOSCOPY polypectomy w/hot snare, cold snare , Clip x 1  performed by Dante Gang, MD at [facility]      OTHER SURGICAL HISTORY  01/01/2011    whipple procedure done for pancreatic cancer    UROLOGICAL SURGERY  2012    Percutaneous extraction of a kidney stone w/ fragmentation procedure        Family History   Problem Relation Age of Onset    Emphysema Brother     Alcohol Abuse Father     Heart Disease Brother        Social History     Tobacco Use    Smoking status: Never    Smokeless tobacco: Never   Vaping Use    Vaping Use: Never used   Substance Use Topics    Alcohol use: Not Currently    Drug use: Not Currently        Pertinent items are noted in HPI.    Vitals:    05/10/22 0100 05/10/22 0500 05/10/22 0651 05/10/22 0701   BP: 123/64 112/61 112/61 122/66   Pulse: 63 68 71 72   Resp: 12 14 20 26    Temp:   98.1 F (36.7 C)    TempSrc:   Oral    SpO2: 98% 96% 95% 97%   Weight:       Height:           Physical Exam:  General Appearance: in no acute distress and alert  Skin: warm and dry  Eyes: sclera anicteric  Neck: neck supple and non tender without mass   Pulmonary/Chest: nonlabored RA  Cardiovascular: normal S1 and S2, + murmur   Abdomen: soft, non-tender  Extremities: no edema  Neurologic: speech normal        DATA    CBC w/Diff   Lab Results   Component Value Date/Time    WBC 7.9 05/10/2022 02:40 AM    WBC 8.6 05/09/2022 04:40 PM    WBC 7.4 09/25/2020 03:00 PM    RBC 2.73 (L) 05/10/2022  02:40 AM    RBC 3.20 (L) 05/09/2022 04:40 PM    RBC 5.01 09/25/2020 03:00 PM    HGB 8.8 (L) 05/10/2022 02:40 AM    HGB 10.0 (L) 05/09/2022 06:20 PM    HGB 10.6 (L) 05/09/2022 04:40 PM    HCT 26.4 (L) 05/10/2022 02:40 AM    HCT 30.1 (L) 05/09/2022 06:20 PM    HCT 31.5 (L) 05/09/2022 04:40 PM    MCV 96.7  05/10/2022 02:40 AM    MCV 98.4 (H) 05/09/2022 04:40 PM    MCV 92.2 09/25/2020 03:00 PM    MCH 32.2 05/10/2022 02:40 AM    MCH 33.1 05/09/2022 04:40 PM    MCH 30.1 09/25/2020 03:00 PM    MCHC 33.3 05/10/2022 02:40 AM    MCHC 33.7 05/09/2022 04:40 PM    MCHC 32.7 09/25/2020 03:00 PM    RDW 51.8 (H) 05/10/2022 02:40 AM    RDW 52.1 (H) 05/09/2022 04:40 PM    PLT 100 (L) 05/10/2022 02:40 AM    PLT 124 (L) 05/09/2022 04:40 PM    PLT 183 09/25/2020 03:00 PM    MPV 11.2 (H) 05/10/2022 02:40 AM    MPV 12.2 (H) 05/09/2022 04:40 PM    MPV 11.0 (H) 09/25/2020 03:00 PM     Lab Results   Component Value Date/Time    MONOS 7.0 05/10/2022 02:40 AM    MONOS 6.9 05/09/2022 04:40 PM        Hepatic Function   Recent Labs     05/09/22  1640 05/10/22  0240   ALT 78* 62*   AST 61.0* 42.0*   TP 5.7 4.8*        Lab Results   Component Value Date/Time    ALKPHOS 70 05/10/2022 02:40 AM    ALT 62 05/10/2022 02:40 AM    AST 42.0 05/10/2022 02:40 AM    PROT 8.0 09/25/2020 03:00 PM    BILITOT 0.40 05/10/2022 02:40 AM    BILIDIR 0.2 06/25/2018 02:25 AM    LABALBU 2.8 05/10/2022 02:40 AM       Basic Metabolic Profile   Recent Labs     05/09/22  1640 05/10/22  0240   NA 136 137   K 4.6 4.7   CL 105 108*   CO2 26 27   BUN 35* 33*        Lab Results   Component Value Date/Time    LABALBU 2.8 05/10/2022 02:40 AM     Lab Results   Component Value Date/Time    LIPASE 21 09/25/2020 03:00 PM     Coagulation   Recent Labs     05/09/22  1640 05/10/22  0240   INR 1.1 1.1   PROTIME 12.5 12.6          Cheral Almas, NP-C  [facility]  Cell # [phone]  Office # [phone]  [address] [place], [place]

## 2022-05-10 NOTE — ED Notes (Signed)
Pt denies having bowel movement tonight.     Tommy Brown, [place]  05/10/22 [phone]

## 2022-05-10 NOTE — ED Notes (Signed)
Patient rounding completed. Patient denies needing anything at this time.     Nicole Kindred, RN  05/10/22 [phone]

## 2022-05-10 NOTE — ED Notes (Signed)
Rounded on pt. Pt is sleeping on his right side.     Tommy Brown, [place]  05/10/22 [phone]

## 2022-05-10 NOTE — ED Notes (Signed)
TRANSFER - OUT REPORT:    Verbal report given to Tommy Brown on Tommy Brown being transferred to 5W for routine progression of patient care       Report consisted of patient's Situation, Background, Assessment and   Recommendations(SBAR).     Information from the following report(s) ED SBAR was reviewed with the receiving nurse.  Kinder Assessment: Presents to emergency department  because of falls (Syncope, seizure, or loss of consciousness): No, Age > 13: Yes, Altered Mental Status, Intoxication with alcohol or substance confusion (Disorientation, impaired judgment, poor safety awaremess, or inability to follow instructions): No, Impaired Mobility: Ambulates or transfers with assistive devices or assistance; Unable to ambulate or transer.: No, Nursing Judgement: Yes  Lines:   Peripheral IV 05/09/22 Right Antecubital (Active)   Site Assessment Clean, dry & intact 05/09/22 1640   Line Status Brisk blood return;Specimen collected;Normal saline locked;Flushed 05/09/22 1640   Phlebitis Assessment No symptoms 05/09/22 1640   Infiltration Assessment 0 05/09/22 1640   Dressing Status Clean, dry & intact 05/09/22 1640   Dressing Type Transparent 05/09/22 1640       Peripheral IV 05/10/22 Left Antecubital (Active)      Medications sent with patient from pharmacy: None to send  Patient belongings: Belongings sent to floor    Opportunity for questions and clarification was provided.      Patient transported with:  Monitor and Tech          Beacher May, [place]  05/10/22 1513

## 2022-05-10 NOTE — ED Notes (Signed)
Pt provided with meal tray.     Beacher May, RN  05/10/22 [phone]

## 2022-05-10 NOTE — ED Notes (Signed)
2300: Pt reports having leg cramping and pain.  Pt medicated per [facility].    [id number]: Pt given urinal per request.  Pt asking for something to eat. Told the pt that he is NPO and can't have something to eat. Pt verbalized understanding.    0115: Pt is currently sleeping at this time.     Laqueta Due May, [place]  05/10/22 0139

## 2022-05-10 NOTE — ED Notes (Signed)
Dietary at bedside.     Beacher May, RN  05/10/22 [phone]

## 2022-05-11 LAB — HEMOGLOBIN AND HEMATOCRIT
Hematocrit: 26.2 % — ABNORMAL LOW (ref 36.0–55.0)
Hematocrit: 25.5 % — ABNORMAL LOW (ref 36.0–55.0)
Hemoglobin: 8.7 gm/dl — ABNORMAL LOW (ref 13.0–18.0)
Hemoglobin: 8.3 gm/dl — ABNORMAL LOW (ref 13.0–18.0)

## 2022-05-11 LAB — RENAL FUNCTION PANEL
Albumin: 2.9 gm/dl — ABNORMAL LOW (ref 3.4–5.0)
Anion Gap: 4 mmol/L — ABNORMAL LOW (ref 5–15)
BUN: 21 mg/dl (ref 9–23)
CO2: 25 mEq/L (ref 20–31)
Calcium: 8.4 mg/dl — ABNORMAL LOW (ref 8.7–10.4)
Chloride: 111 mEq/L — ABNORMAL HIGH (ref 98–107)
Creatinine: 1.39 mg/dl — ABNORMAL HIGH (ref 0.70–1.30)
GFR African American: >60
GFR Non-African American: 53
Glucose: 125 mg/dl — ABNORMAL HIGH (ref 74–106)
Phosphorus: 3.2 mg/dL (ref 2.4–5.1)
Potassium: 4.8 mEq/L (ref 3.5–5.1)
Sodium: 140 mEq/L (ref 136–145)

## 2022-05-11 LAB — CBC WITH AUTO DIFFERENTIAL
Basophils: 0.2 % (ref 0–3)
Eosinophils: 1 % (ref 0–5)
Hematocrit: 26.7 % — ABNORMAL LOW (ref 36.0–55.0)
Hemoglobin: 8.6 gm/dl — ABNORMAL LOW (ref 13.0–18.0)
Immature Granulocytes: 0.5 % (ref 0.0–3.0)
Lymphocytes: 20.3 % — ABNORMAL LOW (ref 28–48)
MCH: 32.6 pg (ref 23.0–34.6)
MCHC: 32.2 gm/dl (ref 30.0–36.0)
MCV: 101.1 fL — ABNORMAL HIGH (ref 80.0–98.0)
MPV: 11.4 fL — ABNORMAL HIGH (ref 6.0–10.0)
Monocytes: 5.6 % (ref 1–13)
Neutrophils Segmented: 72.4 % — ABNORMAL HIGH (ref 34–64)
Nucleated RBCs: 0 (ref 0–0)
Platelets: 117 10*3/uL — ABNORMAL LOW (ref 140–450)
RBC: 2.64 M/uL — ABNORMAL LOW (ref 3.80–5.70)
RDW: 53.7 — ABNORMAL HIGH (ref 35.1–43.9)
WBC: 5.7 10*3/uL (ref 4.0–11.0)

## 2022-05-11 LAB — MAGNESIUM: Magnesium: 1.9 mg/dL (ref 1.6–2.6)

## 2022-05-11 MED ORDER — OMEPRAZOLE 20 MG PO CPDR
20 MG | Freq: Two times a day (BID) | ORAL | Status: DC
Start: 2022-05-11 — End: 2022-05-12
  Administered 2022-05-11 – 2022-05-12 (×2): 20 mg via ORAL

## 2022-05-11 MED FILL — GABAPENTIN 300 MG PO CAPS: 300 MG | ORAL | Qty: 2

## 2022-05-11 MED FILL — PANTOPRAZOLE SODIUM 40 MG IV SOLR: 40 MG | INTRAVENOUS | Qty: 40

## 2022-05-11 MED FILL — TEMAZEPAM 7.5 MG PO CAPS: 7.5 MG | ORAL | Qty: 1

## 2022-05-11 MED FILL — SODIUM CHLORIDE FLUSH 0.9 % IV SOLN: 0.9 % | INTRAVENOUS | Qty: 10

## 2022-05-11 MED FILL — TAMSULOSIN HCL 0.4 MG PO CAPS: 0.4 MG | ORAL | Qty: 2

## 2022-05-11 MED FILL — ALLOPURINOL 100 MG PO TABS: 100 MG | ORAL | Qty: 3

## 2022-05-11 MED FILL — OMEPRAZOLE 20 MG PO CPDR: 20 MG | ORAL | Qty: 1

## 2022-05-11 NOTE — Progress Notes (Signed)
Hospitalist Progress Note  [facility]    Daily Progress Note: 05/11/2022    Assessment/Plan:     LGIB, probably diverticular bleed  ABLA  AKI Cr 1.79-->1.58  HLD  Pancreatic CA s/p Whipple  CAD s/p CABG  BPH w/ LUTS  Chronic neuropathy  Gout  Diverticulosis    Plan:    GI (Dr Jimmye Norman) following, no plans for repeat colonoscopy. If he re-bleeds, GI recommends CTA abdomen to look for active bleeding  ASA resumed 3/16, monitor pt  Transfuse PRBC for Hb <7, Iron level ok  BP meds on hold  Continue gabapentin  Continue Flomax 0.4mg  daily    DVT Prophylaxis  SCDs    Code status. Full Code    Disposition  Monitoring for continued bleed  Transfuse PRBC as needed  If no further bleed in the next 24-48h then home.   GI signed off    ------------------     Chart reviewed including notes, labs, imaging, studies     D/w floor RN regarding current POC and pt's condition     D/w pt and family regarding pt's condition and current POC    ------------------  Physical exam:  General:  Alert, NAD, pleasant.  Cooperative  HEENT: Sclera anicteric, PERRL, OM moist, throat clear.  Chest: no wheeze, no rales, no rhonchi.  Fair air movement.  CV: RRR, S1/S2 were normal, no murmur  Abdomen: NTND, soft, NABS, no masses were noted.  No rebound, no guarding.  Extremities: No edema.    Subjective:     Pt said he feels ok, we discussed meds and resuming ASA. We discussed we would monitor and if no bleed then home Sunday or Monday    Objective:     BP 125/70   Pulse 84   Temp 98.1 F (36.7 C) (Temporal)   Resp 14   Ht 1.676 m (5\' 6")   Wt 63.5 kg (140 lb)   SpO2 95%   BMI 22.60 kg/m         Temp (24hrs), Avg:97.7 F (36.5 C), Min:97 F (36.1 C), Max:98.4 F (36.9 C)    No intake/output data recorded.   03/15 0701 - 03/16 1900  In: 1547.7 [P.O.:200; I.V.:1100]  Out: 1753 [Urine:1750]      Data Review:       Recent Days:  Recent Labs     05/09/22  1640 05/09/22  1820 05/10/22  0240 05/10/22  0800 05/11/22  0344 05/11/22  1049  05/11/22  1625   WBC 8.6  --  7.9  --   --  5.7  --    HGB 10.6*   < > 8.8*   < > 8.7* 8.6* 8.3*   HCT 31.5*   < > 26.4*   < > 26.2* 26.7* 25.5*   PLT 124*  --  100*  --   --  117*  --     < > = values in this interval not displayed.       Recent Labs     03 /14/24  1640 05/10/22  0240 05/11/22  1049   NA 136 137 140   K 4.6 4.7 4.8   CL 105 108* 111*   CO2 26 27 25    BUN 35* 33* 21   MG  --   --  1.9   PHOS  --   --  3.2   ALT 78* 62*  --    INR 1.1 1.1  --  No results for input(s): "PH", "PCO2", "PO2", "HCO3", "FIO2" in the last 72 hours.    24 Hour Results:  Recent Results (from the past 24 hour(s))   Hemoglobin and Hematocrit    Collection Time: 05/11/22  3:44 AM   Result Value Ref Range    Hemoglobin 8.7 (L) 13.0 - 18.0 gm/dl    Hematocrit 26.2 (L) 36.0 - 55.0 %   CBC with Auto Differential    Collection Time: 05/11/22 10:49 AM   Result Value Ref Range    WBC 5.7 4.0 - 11.0 1000/mm3    RBC 2.64 (L) 3.80 - 5.70 M/uL    Hemoglobin 8.6 (L) 13.0 - 18.0 gm/dl    Hematocrit 26.7 (L) 36.0 - 55.0 %    MCV 101.1 (H) 80.0 - 98.0 fL    MCH 32.6 23.0 - 34.6 pg    MCHC 32.2 30.0 - 36.0 gm/dl    Platelets 117 (L) 140 - 450 1000/mm3    MPV 11.4 (H) 6.0 - 10.0 fL    RDW 53.7 (H) 35.1 - 43.9      Nucleated RBCs 0 0 - 0      Immature Granulocytes 0.5 0.0 - 3.0 %    Neutrophils Segmented 72.4 (H) 34 - 64 %    Lymphocytes 20.3 (L) 28 - 48 %    Monocytes 5.6 1 - 13 %    Eosinophils 1.0 0 - 5 %    Basophils 0.2 0 - 3 %   Magnesium    Collection Time: 05/11/22 10:49 AM   Result Value Ref Range    Magnesium 1.9 1.6 - 2.6 mg/dL   Renal Function Panel    Collection Time: 05/11/22 10:49 AM   Result Value Ref Range    Potassium 4.8 3.5 - 5.1 mEq/L    Chloride 111 (H) 98 - 107 mEq/L    Sodium 140 136 - 145 mEq/L    CO2 25 20 - 31 mEq/L    Glucose 125 (H) 74 - 106 mg/dl    BUN 21 9 - 23 mg/dl    Creatinine 1.39 (H) 0.70 - 1.30 mg/dl    GFR African American >60      GFR Non-African American 53      Calcium 8.4 (L) 8.7 - 10.4 mg/dl     Anion Gap 4 (L) 5 - 15 mmol/L    Albumin 2.9 (L) 3.4 - 5.0 gm/dl    Phosphorus 3.2 2.4 - 5.1 mg/dL   Hemoglobin and Hematocrit    Collection Time: 05/11/22  4:25 PM   Result Value Ref Range    Hemoglobin 8.3 (L) 13.0 - 18.0 gm/dl    Hematocrit 25.5 (L) 36.0 - 55.0 %       No results found.    Microbiology:  @MICRORESULTS @     Cardiology:  @LASTCARDIOLOGY @      Problem List:      Medications reviewed  Current Facility-Administered Medications   Medication Dose Route Frequency    omeprazole (PRILOSEC) delayed release capsule 20 mg  20 mg Oral BID AC    ondansetron (ZOFRAN) injection 4 mg  4 mg IntraVENous Q6H PRN    dextrose 5 % and 0.9 % sodium chloride infusion   IntraVENous Continuous    allopurinol (ZYLOPRIM) tablet 300 mg  300 mg Oral Daily    [Held by provider] amLODIPine (NORVASC) tablet 5 mg  5 mg Oral Daily    aspirin chewable tablet 81 mg  81 mg Oral Daily    [  Held by provider] bumetanide (BUMEX) tablet 1 mg  1 mg Oral Daily    gabapentin (NEURONTIN) capsule 600 mg  600 mg Oral BID    [Held by provider] metoprolol succinate (TOPROL XL) extended release tablet 25 mg  25 mg Oral Daily    [Held by provider] pravastatin (PRAVACHOL) tablet 10 mg  10 mg Oral Daily    tamsulosin (FLOMAX) capsule 0.8 mg  0.8 mg Oral Daily    temazepam (RESTORIL) capsule 7.5 mg  7.5 mg Oral Nightly PRN    sodium chloride flush 0.9 % injection 5-40 mL  5-40 mL IntraVENous 2 times per day    sodium chloride flush 0.9 % injection 5-40 mL  5-40 mL IntraVENous PRN    0.9 % sodium chloride infusion   IntraVENous PRN    acetaminophen (TYLENOL) tablet 650 mg  650 mg Oral Q6H PRN    Or    acetaminophen (TYLENOL) suppository 650 mg  650 mg Rectal Q6H PRN         Kristen Loader, MD

## 2022-05-11 NOTE — Plan of Care (Signed)
Problem: ABCDS Injury Assessment  Goal: Absence of physical injury  Outcome: Progressing     Problem: Safety - Adult  Goal: Free from fall injury  05/11/2022 0850 by Cyndi Bender, RN  Outcome: Progressing  05/10/2022 2332 by Otilio Miu, RN  Outcome: Progressing     Problem: Pain  Goal: Verbalizes/displays adequate comfort level or baseline comfort level  Outcome: Progressing

## 2022-05-11 NOTE — Care Coordination-Inpatient (Signed)
CM attempted to do Assessment. Pt taking a bath.

## 2022-05-11 NOTE — Progress Notes (Signed)
Progress Note    Patient: Tommy Brown Age: 77 y.o. Sex: male    Date of Birth: December 07, 1945 Admit Date: 05/09/2022 PCP: Daralene Milch, MD   MRN: [id number]  CSN: [id number]         Admit Date: 05/09/2022    Assessment:     Principal Problem:    Rectal bleeding  Active Problems:    Lower GI bleed  Resolved Problems:    * No resolved hospital problems. *    GIB--painless hematochezia, suspect diverticular, less likely  brisk upper.   Acute Anemia s/2 #1  Pancreatic cancer s/p [place]  AKI  HTN/HLD  CAD s/p CABG  BPH  Chronic neuropathy  Gout        EGD/CS, Dr. Lucien Mons 06/2020:  E/o prior surgery, o/w nml, esophageal bx unremarkable  Diverticulosis, polyps(adenomatous polyps), IH  Plan:     Likely another  episode of Diverticular  bleed that  has  resolved. Feels great . One stool with  tiny  blood today.   Hb  stable (8.6-8.7gms)  . Will  d/c IV Protonix  , change  to  p.o  Prilosec  , restart Aspirin 81mg , Advance to  soft  diet  , If  H/H stable  and  no further  bleeding  home in next  24-48 hours . Signing  off Thanks      Subjective:     Seen  in  presence  of wife  feels well , no new  c/o      Objective:     BP 126/73   Pulse 78   Temp 97.6 F (36.4 C) (Temporal)   Resp 18   Ht 1.676 m (5\' 6" )   Wt 63.5 kg (140 lb)   SpO2 98%   BMI 22.60 kg/m   Temp (24hrs), Avg:97.5 F (36.4 C), Min:97 F (36.1 C), Max:98.4 F (36.9 C)      Physical Exam:   Constitutional: Appearance and behavior are age and situation appropriate.  HEENT: Conjunctivae clear. .  Neck: Supple, non tender, symmetrical.   Respiratory: Lungs clear to auscultation, nonlabored respiration.  Cardiovascular: Heart regular rate .     No peripheral edema.    Gastrointestinal: soft  no tenderness    Musculoskeletal: Nail beds pink with prompt capillary refill.  Integumentary: Warm and dry   Neurologic: Alert and oriented,       Intake/Output Summary (Last 24 hours) at 05/11/2022 1450  Last data filed at 05/11/2022 0945  Gross per  24 hour   Intake 1350 ml   Output 1350 ml   Net 0 ml       Current Facility-Administered Medications   Medication Dose Route Frequency Provider Last Rate Last Admin    omeprazole EC tablet 20 mg  20 mg Oral BID AC Lindon Romp, MD        ondansetron [facility]) injection 4 mg  4 mg IntraVENous Q6H PRN Hutchinson, Anne, DO   4 mg at 05/09/22 1800    dextrose 5 % and 0.9 % sodium chloride infusion   IntraVENous Continuous Hutchinson, Anne, DO 100 mL/hr at 05/11/22 0935 New Bag at 05/11/22 0935    allopurinol (ZYLOPRIM) tablet 300 mg  300 mg Oral Daily Hutchinson, Anne, DO   300 mg at 05/11/22 0933    [Held by provider] amLODIPine (NORVASC) tablet 5 mg  5 mg Oral Daily Hutchinson, Anne, DO        aspirin chewable tablet 81 mg  81  mg Oral Daily Hutchinson, Anne, DO        [Held by provider] bumetanide (BUMEX) tablet 1 mg  1 mg Oral Daily Hutchinson, Anne, DO        gabapentin (NEURONTIN) capsule 600 mg  600 mg Oral BID Jonnie Kind, DO   600 mg at 05/11/22 [id number]    [Held by provider] metoprolol succinate (TOPROL XL) extended release tablet 25 mg  25 mg Oral Daily Hutchinson, Anne, DO        [Held by provider] pravastatin (PRAVACHOL) tablet 10 mg  10 mg Oral Daily Hutchinson, Anne, DO        tamsulosin (FLOMAX) capsule 0.8 mg  0.8 mg Oral Daily Hutchinson, Anne, DO   0.8 mg at 05/11/22 0933    temazepam (RESTORIL) capsule 7.5 mg  7.5 mg Oral Nightly PRN Jonnie Kind, DO   7.5 mg at 05/10/22 2234    sodium chloride flush 0.9 % injection 5-40 mL  5-40 mL IntraVENous 2 times per day Jonnie Kind, DO   10 mL at 05/11/22 0933    sodium chloride flush 0.9 % injection 5-40 mL  5-40 mL IntraVENous PRN Hutchinson, Anne, DO        0.9 % sodium chloride infusion   IntraVENous PRN Jonnie Kind, DO        acetaminophen (TYLENOL) tablet 650 mg  650 mg Oral Q6H PRN Jonnie Kind, DO   650 mg at 05/10/22 1345    Or    acetaminophen (TYLENOL) suppository 650 mg  650 mg Rectal Q6H PRN Jonnie Kind, DO              Recent Labs     05/10/22  0240 05/10/22  0800 05/11/22  0344 05/11/22  1049   WBC 7.9  --   --  5.7   RBC 2.73*  --   --  2.64*   HGB 8.8*   < > 8.7* 8.6*   HCT 26.4*   < > 26.2* 26.7*   MCV 96.7  --   --  101.1*   MCHC 33.3  --   --  32.2   RDW 51.8*  --   --  53.7*   PLT 100*  --   --  117*   MPV 11.2*  --   --  11.4*    < > = values in this interval not displayed.     Recent Labs     05/10/22  0240 05/11/22  1049   NA 137 140   K 4.7 4.8   CL 108* 111*   CO2 27 25   BUN 33* 21     Recent Labs     05/09/22  1640 05/10/22  0240   INR 1.1 1.1         Recent Labs     05/09/22  1640 05/10/22  0240   TP 5.7 4.8*   ALT 78* 62*       No results for input(s): "AML" in the last 72 hours.    Invalid input(s): "LPSE"    Abdominal xray:        Lindon Romp MD, M.Sc., MRCP([place]), AGAF., [facility] [phone]  Fax  [phone]  [address] , [place]  [place] [address]

## 2022-05-11 NOTE — Care Coordination-Inpatient (Signed)
Pt lying in bed. Spouse at bedside. Verified pt. Pt consents to wife staying during [place].  Pt works at [employer] in [employer]. Rides bike to work daily.   Wife will provide transportation home.    Pt has :  COVID vaccine: Yes  ACP: Yes  Ph[facility] [address] in [place]  Pt consents to CM proactively posting to [facility] and SNFs: Yes   Pets in the home: No  CM confirmed Ins. Yes       05/11/22 1057   Service Assessment   Patient Orientation Alert and Oriented   Cognition Alert   History Provided By Patient   Primary Caregiver Self   Accompanied By/Relationship Spouse   Support Systems Spouse/Significant Other   PCP Verified by CM Yes   Last Visit to PCP Within last 3 months   Prior Functional Level Independent in ADLs/IADLs   Current Functional Level Independent in ADLs/IADLs   Can patient return to prior living arrangement Yes   Ability to make needs known: Good   Family able to assist with home care needs: Yes  (Spouse)   Would you like for me to discuss the discharge plan with any other family members/significant others, and if so, who? Yes  (Spouse)   Social/Functional History   Lives With Spouse   Type of [place] One level   [place] bars in [place] Responsibilities Yes   Ambulation Assistance Independent   Transfer Assistance Independent   Occupation Part time employment   Type of Occupation Maintenance   Discharge [place] One story   History of falls? 0   Services At/After Discharge   [employer] Information Provided? No   Mode of Transport at Discharge   (Wife to provide transport)

## 2022-05-12 LAB — CBC WITH AUTO DIFFERENTIAL
Basophils: 0.2 % (ref 0–3)
Eosinophils: 1 % (ref 0–5)
Hematocrit: 25.5 % — ABNORMAL LOW (ref 36.0–55.0)
Hemoglobin: 8.2 gm/dl — ABNORMAL LOW (ref 13.0–18.0)
Immature Granulocytes: 0.5 % (ref 0.0–3.0)
Lymphocytes: 23.6 % — ABNORMAL LOW (ref 28–48)
MCH: 31.7 pg (ref 23.0–34.6)
MCHC: 32.2 gm/dl (ref 30.0–36.0)
MCV: 98.5 fL — ABNORMAL HIGH (ref 80.0–98.0)
MPV: 11.8 fL — ABNORMAL HIGH (ref 6.0–10.0)
Monocytes: 5.6 % (ref 1–13)
Neutrophils Segmented: 69.1 % — ABNORMAL HIGH (ref 34–64)
Nucleated RBCs: 0 (ref 0–0)
Platelets: 114 10*3/uL — ABNORMAL LOW (ref 140–450)
RBC: 2.59 M/uL — ABNORMAL LOW (ref 3.80–5.70)
RDW: 50.8 — ABNORMAL HIGH (ref 35.1–43.9)
WBC: 5.9 10*3/uL (ref 4.0–11.0)

## 2022-05-12 LAB — MAGNESIUM: Magnesium: 1.8 mg/dL (ref 1.6–2.6)

## 2022-05-12 LAB — RENAL FUNCTION PANEL
Albumin: 2.7 gm/dl — ABNORMAL LOW (ref 3.4–5.0)
Anion Gap: 4 mmol/L — ABNORMAL LOW (ref 5–15)
BUN: 15 mg/dl (ref 9–23)
CO2: 22 mEq/L (ref 20–31)
Calcium: 8.6 mg/dl — ABNORMAL LOW (ref 8.7–10.4)
Chloride: 113 mEq/L — ABNORMAL HIGH (ref 98–107)
Creatinine: 1.27 mg/dl (ref 0.70–1.30)
GFR African American: >60
GFR Non-African American: 58
Glucose: 128 mg/dl — ABNORMAL HIGH (ref 74–106)
Phosphorus: 3.2 mg/dL (ref 2.4–5.1)
Potassium: 4.8 mEq/L (ref 3.5–5.1)
Sodium: 139 mEq/L (ref 136–145)

## 2022-05-12 LAB — HEMOGLOBIN AND HEMATOCRIT
Hematocrit: 27.2 % — ABNORMAL LOW (ref 36.0–55.0)
Hemoglobin: 8.9 gm/dl — ABNORMAL LOW (ref 13.0–18.0)

## 2022-05-12 MED ORDER — BUMETANIDE 1 MG PO TABS
1 MG | ORAL_TABLET | Freq: Every day | ORAL | 3 refills | Status: AC
Start: 2022-05-12 — End: 2022-08-20

## 2022-05-12 MED FILL — GABAPENTIN 300 MG PO CAPS: 300 MG | ORAL | Qty: 2

## 2022-05-12 MED FILL — ASPIRIN 81 MG PO CHEW: 81 MG | ORAL | Qty: 1

## 2022-05-12 MED FILL — OMEPRAZOLE 20 MG PO CPDR: 20 MG | ORAL | Qty: 1

## 2022-05-12 MED FILL — TEMAZEPAM 7.5 MG PO CAPS: 7.5 MG | ORAL | Qty: 1

## 2022-05-12 MED FILL — ALLOPURINOL 100 MG PO TABS: 100 MG | ORAL | Qty: 3

## 2022-05-12 MED FILL — TAMSULOSIN HCL 0.4 MG PO CAPS: 0.4 MG | ORAL | Qty: 2

## 2022-05-12 MED FILL — SODIUM CHLORIDE FLUSH 0.9 % IV SOLN: 0.9 % | INTRAVENOUS | Qty: 10

## 2022-05-12 NOTE — Telephone Encounter (Addendum)
----------  [id number] (AP)-------------------------------------------              [facility]                       Patient Education Report         Name: Tommy Brown, Tommy Brown                  Date: 05/11/2022    MRN: [id number]                    Time: 3:16:49 AM         Patient ordered video: 'Patient Safety: Stay Safe While you are in the Hospital'    from [id number] via phone number: [phone] at 3:16:49 AM    Description: This program outlines some of the precautions patients can take to ensure a speedy recovery without extra complications. The video emphasizes the importance of communicating with the healthcare team.    ----------DocumentID: [id number] (AP)-------------------------------------------                       [facility]          Patient Education Report - Discharge Summary        Date: 05/12/2022   Time: 7:00:13 PM   Name: Tommy Brown, Tommy Brown   MRN: [id number]      Account Number: [id number]      Education History:        Patient ordered video: 'Patient Safety: Stay Safe While you are in the Hospital' from [id number] on 05/11/2022 03:16:49 AM

## 2022-05-12 NOTE — Discharge Summary (Signed)
Physician Discharge Summary     Patient ID:  Patient Name: Tommy Brown  Medical Record Number: [id number]  Date of Birth: 10-29-1945  Age: 77 y.o.    Primary Care Provider: Daralene Milch, MD    Code Status: Full Code     Admit date: 05/09/2022    Discharge Date:  05/12/2022    Admitting Physician: Jonnie Kind, DO    Discharge Physician: Kristen Loader, MD    Discharge Disposition: Home    Activity: activity as tolerated    Diet: cardiac diet    Follow-up appointments:    Daralene Milch, MD  [address] [place]  [place] [address]  [phone]    Schedule an appointment as soon as possible for a visit  To review meds (held) and to review the hospitalization. Check Lab: BMP, CBC w/o diff. Consider Vit B12 level due to elevated MCV.    Follow-up recommendations:     Lab: BMP, CBC w/o diff. Consider Vit B12 level     Review BP meds as his BP was low/low-normal while in the hospital     I did not have access to the pt's Echo that he had last week, but consider adjusting Bumex dose as he looked quite euvolemic while in the hospital    Patient Instructions:   Current Discharge Medication List        CONTINUE these medications which have CHANGED    Details   bumetanide (BUMEX) 1 MG tablet Take 1 tablet by mouth daily Resume on 05/14/2022  Qty: 30 tablet, Refills: 3           CONTINUE these medications which have NOT CHANGED    Details   pravastatin (PRAVACHOL) 10 MG tablet Take 1 tablet by mouth daily      meloxicam (MOBIC) 7.5 MG tablet Take 1 tablet by mouth daily      omeprazole (PRILOSEC) 40 MG delayed release capsule Take 1 capsule by mouth daily      methocarbamol (ROBAXIN) 500 MG tablet Take 1 tablet by mouth daily as needed      acetaminophen (TYLENOL) 325 MG tablet Take 2 tablets by mouth every 6 hours as needed      allopurinol (ZYLOPRIM) 300 MG tablet Take 1 tablet by mouth daily      aspirin 81 MG chewable tablet Take 1 tablet by mouth daily      gabapentin (NEURONTIN)  600 MG tablet Take 2 tablets by mouth 2 times daily.      metoprolol succinate (TOPROL XL) 25 MG extended release tablet TAKE 1 TABLET BY MOUTH EVERY DAY      lipase-protease-amylase (CREON) 24000-76000 units delayed release capsule Take 2 capsules by mouth 3 times daily (with meals)      tamsulosin (FLOMAX) 0.4 MG capsule Take 2 capsules by mouth daily      temazepam (RESTORIL) 7.5 MG capsule Take 2 capsules by mouth nightly as needed for Anxiety or Sleep.           STOP taking these medications       amLODIPine (NORVASC) 5 MG tablet Comments:   Reason for Stopping:               Admission Diagnoses: Dizziness [R42]  Rectal bleeding [K62.5]  Lower GI bleed [K92.2]  Anemia due to blood loss [D50.0]  AKI (acute kidney injury) ([place]) [N17.9]  History of diverticulosis [Z87.19]  Nausea and vomiting, unspecified vomiting type [R11.2]  Discharge Diagnoses: see below    Admission Condition: fair    Discharged Condition: good    Hospital Course:     Presented to ER w/ reports of blood in stools. Bleeding started 3/13am, Pt had multiple BM that day then up to 20 stools mixed w/ blood, large volume on 3/14 which was the day of presentation to ER he reported feeling lightheaded, fatigue. No abd pain, no n/v. Denied NSAID use currently except for his daily ASA 81mg . He denied being on blood thinners. Denied ETOH, smoking. No hx GI bleeding or diverticulitis. Works at [employer]. ER labs revealed a H/H 10.6/31.5, compared to previous 09/2020 of 15/46. H/H dropped to 8.8/26.4 the next morning, but he was also rehydrated due to hypotension. Pt was noted to have and LFT elevation. AST 61 > 42, ALT 78 > 62. AP & Tbili nml. Alb 2.8. WBC nL, plt 100. In the ER, a rectal exam revealed maroon colored blood mixed w/ small amt stool. Pt was not grossly bleeding from his rectum, he did have some PERI anal hemorrhoids however they are not thrombosed, and not actively bleeding. It was suspected that the pt may have had a diverticular bleed.  We had the pt on bowel rest with a CLD. We also held his ASA during this time. Pt's diet was advanced and pt's ASA resumed. No re-bleeding noted. GI noted that if he had another large bleed that the pt may need a CTA of the abdomen to localize the area of the bleed. Pt was d/c home in good condition. His Hb remained in the 8's the rest of the hospital stay and he no longer had any GIB. Pt was d/c home in good condition. We had to hold some of his BP meds due to relative hypotension and AKI. We resumed his Bumex and Toprol XL, but the Bumex dosing may need to be revisited as he may only require a smaller dose. Recheck labs upon follow-up.    LGIB, probably diverticular bleed  ABLA  AKI on CKD3a Cr 1.79-->1.27 (3/14-->3/17)  HLD  Pancreatic CA s/p Whipple  CAD s/p CABG  BPH w/ LUTS  Chronic neuropathy  Gout  Diverticulosis    ------------------    65 minutes were spent on the discharge of the patient of which more than 50% was spent in coordination of care and counseling (time spent with patient/family face to face, physical exam, reviewing laboratory and imaging investigations, speaking with physicians and nursing staff involved in this patient's care)    ------------------    Physical exam:  General: Alert, NAD, pleasant.  Cooperative  HEENT: Sclera anicteric, PERRL, OM moist, throat clear.  Chest: no wheeze, no rales, no rhonchi.  Good air movement.  CV: RRR, S1/S2 were normal, no murmur  Abdomen: NTND, soft, NABS, no masses were noted.  No rebound, no guarding.  Extremities: No edema.    Subjective:     Wife at bedside. Pt said that he feels well, no CP, no SOB, no BRBPR, no LH/dizz. We discussed holding some of his meds due to his BP being on the low normal side. We discussed f/up with his PCP. We discussed that he can resume a low fiber diet for now, then go back to his usual in about a week.       Vitals:    05/12/22 1529   BP: (!) 111/59   Pulse: 76   Resp: 18   Temp: 97.5 F (36.4 C)   SpO2: 97%  Initial presentation and work-up:  [As per Dr Webb Silversmith [facility]'s H+P from 05/09/2022]  #############  "  Chief Complaint   Patient presents with    Rectal Bleeding    Dizziness       HPI:   Franisco Fedorov Brown is a 77 y.o. year old male medical [employer] pancreatic cancer status post Whipple, coronary disease status post CABG hyperlipidemia hypertension diverticulosis BPH.  Patient presents from home today with complaint of blood in stool.  He states that starting yesterday he has had "15-20 times "of bleeding mixed in with his stool.  He states the volume seems so significant that he thinks it is filling the toilet bowl.  Today he was little bit lightheaded dizzy felt weak and fatigued so decided to seek medical attention.  Denies any issue with this before.  Does not currently take NSAIDs but has been on NSAIDs for arthritis and back pain and leg issues in the past.  He states he had a colonoscopy last year with Dr. Verne Spurr.     Lab work today in the ER identifies hemoglobin of 10 which is acute drop from him baseline labs typically hemoglobin between 13-15.  Platelet count also a little low at 124.  AST and ALT mildly elevated.  Patient had evidence of external hemorrhoid on exam that did not seem to be actively bleeding.  GI team consulted.  Blood pressure soft [id number] systolic.      Review of Systems -   Review of Systems   Constitutional:  Positive for fatigue. Negative for chills and fever.   HENT:  Negative for congestion, sinus pressure and sore throat.    Eyes:  Negative for pain and visual disturbance.   Respiratory:  Negative for cough, shortness of breath and wheezing.    Cardiovascular:  Negative for chest pain, palpitations and leg swelling.   Gastrointestinal:  Positive for anal bleeding and blood in stool. Negative for abdominal pain, nausea and vomiting.   Genitourinary:  Negative for dysuria and frequency.   Musculoskeletal:  Negative for back pain.   Skin:  Negative for color change and rash.    Neurological:  Positive for dizziness and light-headedness. Negative for weakness and headaches.   Psychiatric/Behavioral:  Negative for dysphoric mood. The patient is not nervous/anxious.        Past Medical History:  Past Medical History        Past Medical History:   Diagnosis Date    Arthritis      Benign prostate hyperplasia      Bladder infection      Chronic kidney disease      Coronary arteriosclerosis      Coronary atherosclerosis of artery bypass graft      Decreased testosterone level      Diverticular disease      Essential hypertension      Gout      History of acute renal failure      History of anemia      History of malignant neoplasm of pancreas      Hx of CABG      Kidney stone      Kidney stones      Neuropathy       NECK, SHOULDERS    Pancreatic cancer ([place])      Sepsis ([place])            Past Surgical History:  Past Surgical History         Past Surgical History:  Procedure Laterality Date    CABG, ARTERY-VEIN, FOUR   1995    CARDIAC CATHETERIZATION   2006    CERVICAL FUSION        COLONOSCOPY N/A 07/10/2020     DIAGNOSTIC COLONOSCOPY polypectomy w/hot snare, cold snare , Clip x 1  performed by Dante Gang, MD at [facility]        OTHER SURGICAL HISTORY   01/01/2011     whipple procedure done for pancreatic cancer    UROLOGICAL SURGERY   2012     Percutaneous extraction of a kidney stone w/ fragmentation procedure           Family History:  Family History         Family History   Problem Relation Age of Onset    Emphysema Brother      Alcohol Abuse Father      Heart Disease Brother            Social History:  Social History   Social History           Socioeconomic History    Marital status: Married   Tobacco Use    Smoking status: Never    Smokeless tobacco: Never   Vaping Use    Vaping Use: Never used   Substance and Sexual Activity    Alcohol use: Not Currently    Drug use: Not Currently    Sexual activity:  Defer   Social History Narrative     ** Merged History Encounter **                Home Medications:  Home Medications           Prior to Admission medications    Medication Sig Start Date End Date Taking? Authorizing Provider   tiZANidine HCl (ZANAFLEX PO) 5mg        [provider]   furosemide (LASIX) 20 MG tablet Furosemide Oral    tablet PRN    active       [provider]   methocarbamol (ROBAXIN) 500 MG tablet Take 1 tablet by mouth daily as needed 08/06/21     [provider]   Mouthwashes Charlene Brooke) LIQD oral solution USE 15 ML AND SWISH FOR 30 SECONDS THEN SPIT OUT- 4 TO 5 TIMES DAILY AS NEEDED FOR DRY MOUTH  Patient not taking: Reported on 11/15/2021 06/14/21     [provider]   acetaminophen (TYLENOL) 325 MG tablet Take 2 tablets by mouth every 6 hours as needed       [provider]   rosuvastatin (CRESTOR) 20 MG tablet Take 1 tablet by mouth daily 05/13/21     [provider]   B-D 3CC LUER-LOK SYR 25GX1" 25G X 1" 3 ML MISC   04/20/21     [provider]   tobramycin-dexamethasone (TOBRADEX) 0.3-0.1 % ophthalmic suspension   05/28/21     [provider]   allopurinol (ZYLOPRIM) 300 MG tablet Take by mouth daily       Automatic Reconciliation, Ar   amLODIPine (NORVASC) 5 MG tablet Take 1 tablet by mouth daily       Automatic Reconciliation, Ar   aspirin 81 MG chewable tablet Take 1 tablet by mouth daily       Automatic Reconciliation, Ar   bimatoprost (LUMIGAN) 0.01 % SOLN  ophthalmic drops Apply 1 drop to eye  Patient not taking: Reported on 02/14/2022       Automatic Reconciliation, Ar   gabapentin (NEURONTIN) 600 MG tablet Take 1 tablet by mouth 2 times daily.       Automatic Reconciliation, Ar   metoprolol succinate (TOPROL XL) 25 MG extended release tablet TAKE 1 TABLET BY MOUTH EVERY DAY 07/21/18     Automatic Reconciliation, Ar   ondansetron (ZOFRAN-ODT) 4 MG disintegrating tablet Take 1 tablet by mouth every 8 hours as needed  02/17/20     Automatic Reconciliation, Ar   lipase-protease-amylase (CREON) 24000-76000 units delayed release capsule Take 2 capsules by mouth  Patient not taking: Reported on 02/14/2022       Automatic Reconciliation, Ar   tamsulosin (FLOMAX) 0.4 MG capsule Take 2 capsules by mouth daily 07/17/18     Automatic Reconciliation, Ar   temazepam (RESTORIL) 7.5 MG capsule Take by mouth.       Automatic Reconciliation, Ar   testosterone cypionate (DEPOTESTOTERONE CYPIONATE) 200 MG/ML injection INJECT 0.5 MILLILITER INTO THE MUSCLE EVERY 2 WEEKS DISCARD VIAL AFTER USE 11/16/18     Automatic Reconciliation, Ar          Allergies:        Allergies   Allergen Reactions    Codeine Itching and Swelling    Oxycodone Anaphylaxis    Amoxicillin Diarrhea    Hydroxyzine Pamoate Itching and Other (See Comments)       Other reaction(s): Unknown    Meperidine Itching, Other (See Comments), Palpitations and Swelling       Other reaction(s): Unknown    Metoclopramide Itching, Other (See Comments) and Swelling       Other reaction(s): Other (See Comments)  Reaction unknown   Reaction unknown     Morphine Nausea And Vomiting and Other (See Comments)    Oxycodone-Acetaminophen Itching    Prochlorperazine Itching and Other (See Comments)       Other reaction(s): Unknown (comments)  Other reaction(s): Unknown  Unsure of reaction    Hydroxyzine Hcl Hives    Other Other (See Comments)       VISTAID  ANESTHESIA       Physical Exam:   Visit Vitals  BP 111/68   Pulse 74   Resp 18   SpO2 98%      Physical Exam  Constitutional:       General: He is awake.      Appearance: Normal appearance.   HENT:      Head: Normocephalic and atraumatic.      Mouth/Throat:      Mouth: Mucous membranes are dry.   Eyes:      General: Lids are normal.      Pupils: Pupils are equal, round, and reactive to light.   Cardiovascular:      Rate and Rhythm: Normal rate and regular rhythm.      Heart sounds: Normal heart sounds, S1 normal and S2 normal.   Pulmonary:       Effort: Pulmonary effort is normal.      Breath sounds: Normal breath sounds.   Abdominal:      General: Abdomen is flat. Bowel sounds are normal.      Palpations: Abdomen is soft.      Tenderness: There is no abdominal tenderness.   Musculoskeletal:         General: Normal range of motion.      Cervical back: Neck supple.  Right lower leg: No edema.      Left lower leg: No edema.   Skin:     General: Skin is warm and dry.      Capillary Refill: Capillary refill takes less than 2 seconds.   Neurological:      General: No focal deficit present.      Mental Status: He is alert and oriented to person, place, and time.   Psychiatric:         Attention and Perception: Attention normal.         Mood and Affect: Mood normal.         Behavior: Behavior is cooperative"  #############    ACUTE DIAGNOSES:  Dizziness [R42]  Rectal bleeding [K62.5]  Lower GI bleed [K92.2]  Anemia due to blood loss [D50.0]  AKI (acute kidney injury) ([place]) [N17.9]  History of diverticulosis [Z87.19]  Nausea and vomiting, unspecified vomiting type [R11.2]    Data Review:     Recent Labs     05/10/22  0240 05/10/22  0800 05/11/22  1049 05/11/22  1625 05/12/22  0415 05/12/22  1310   WBC 7.9  --  5.7  --  5.9  --    HGB 8.8*   < > 8.6* 8.3* 8.2* 8.9*   HCT 26.4*   < > 26.7* 25.5* 25.5* 27.2*   PLT 100*  --  117*  --  114*  --     < > = values in this interval not displayed.     Recent Labs     05/09/22  1640 05/10/22  0240 05/11/22  1049 05/12/22  0415   NA 136 137 140 139   K 4.6 4.7 4.8 4.8   CL 105 108* 111* 113*   CO2 26 27 25 22    BUN 35* 33* 21 15   MG  --   --  1.9 1.8   PHOS  --   --  3.2 3.2   ALT 78* 62*  --   --    INR 1.1 1.1  --   --      24 Hour Results:  Recent Results (from the past 24 hour(s))   Hemoglobin and Hematocrit    Collection Time: 05/11/22  4:25 PM   Result Value Ref Range    Hemoglobin 8.3 (L) 13.0 - 18.0 gm/dl    Hematocrit 25.5 (L) 36.0 - 55.0 %   CBC with Auto Differential    Collection Time: 05/12/22  4:15 AM    Result Value Ref Range    WBC 5.9 4.0 - 11.0 1000/mm3    RBC 2.59 (L) 3.80 - 5.70 M/uL    Hemoglobin 8.2 (L) 13.0 - 18.0 gm/dl    Hematocrit 25.5 (L) 36.0 - 55.0 %    MCV 98.5 (H) 80.0 - 98.0 fL    MCH 31.7 23.0 - 34.6 pg    MCHC 32.2 30.0 - 36.0 gm/dl    Platelets 114 (L) 140 - 450 1000/mm3    MPV 11.8 (H) 6.0 - 10.0 fL    RDW 50.8 (H) 35.1 - 43.9      Nucleated RBCs 0 0 - 0      Immature Granulocytes 0.5 0.0 - 3.0 %    Neutrophils Segmented 69.1 (H) 34 - 64 %    Lymphocytes 23.6 (L) 28 - 48 %    Monocytes 5.6 1 - 13 %    Eosinophils 1.0 0 - 5 %    Basophils 0.2 0 -  3 %   Magnesium    Collection Time: 05/12/22  4:15 AM   Result Value Ref Range    Magnesium 1.8 1.6 - 2.6 mg/dL   Renal Function Panel    Collection Time: 05/12/22  4:15 AM   Result Value Ref Range    Potassium 4.8 3.5 - 5.1 mEq/L    Chloride 113 (H) 98 - 107 mEq/L    Sodium 139 136 - 145 mEq/L    CO2 22 20 - 31 mEq/L    Glucose 128 (H) 74 - 106 mg/dl    BUN 15 9 - 23 mg/dl    Creatinine 1.27 0.70 - 1.30 mg/dl    GFR African American >60      GFR Non-African American 58      Calcium 8.6 (L) 8.7 - 10.4 mg/dl    Anion Gap 4 (L) 5 - 15 mmol/L    Albumin 2.7 (L) 3.4 - 5.0 gm/dl    Phosphorus 3.2 2.4 - 5.1 mg/dL   Hemoglobin and Hematocrit    Collection Time: 05/12/22  1:10 PM   Result Value Ref Range    Hemoglobin 8.9 (L) 13.0 - 18.0 gm/dl    Hematocrit 27.2 (L) 36.0 - 55.0 %       Signed:  Kristen Loader, MD  05/12/2022

## 2022-05-12 NOTE — Plan of Care (Signed)
Problem: ABCDS Injury Assessment  Goal: Absence of physical injury  05/12/2022 0742 by Cyndi Bender, RN  Outcome: Progressing  05/11/2022 2228 by Matthew Saras, RN  Outcome: Progressing     Problem: Safety - Adult  Goal: Free from fall injury  05/12/2022 0742 by Cyndi Bender, RN  Outcome: Progressing  05/11/2022 2228 by Matthew Saras, RN  Outcome: Progressing     Problem: Pain  Goal: Verbalizes/displays adequate comfort level or baseline comfort level  05/12/2022 0742 by Cyndi Bender, RN  Outcome: Progressing  05/11/2022 2228 by Matthew Saras, RN  Outcome: Progressing

## 2022-05-12 NOTE — Plan of Care (Signed)
Problem: ABCDS Injury Assessment  Goal: Absence of physical injury  05/12/2022 1655 by Cyndi Bender, RN  Outcome: Adequate for Discharge  05/12/2022 [phone] by Cyndi Bender, RN  Outcome: Progressing     Problem: Safety - Adult  Goal: Free from fall injury  05/12/2022 1655 by Cyndi Bender, RN  Outcome: Adequate for Discharge  05/12/2022 0742 by Cyndi Bender, RN  Outcome: Progressing     Problem: Pain  Goal: Verbalizes/displays adequate comfort level or baseline comfort level  05/12/2022 1655 by Cyndi Bender, RN  Outcome: Adequate for Discharge  05/12/2022 0742 by Cyndi Bender, RN  Outcome: Progressing

## 2022-05-16 ENCOUNTER — Emergency Department: Admit: 2022-05-17 | Payer: MEDICARE | Primary: Family Medicine

## 2022-05-16 DIAGNOSIS — K625 Hemorrhage of anus and rectum: Secondary | ICD-10-CM

## 2022-05-16 DIAGNOSIS — K5731 Diverticulosis of large intestine without perforation or abscess with bleeding: Secondary | ICD-10-CM

## 2022-05-16 LAB — CBC WITH AUTO DIFFERENTIAL
Basophils: 0.1 % (ref 0–3)
Eosinophils: 1 % (ref 0–5)
Hematocrit: 25.4 % — ABNORMAL LOW (ref 36.0–55.0)
Hemoglobin: 8.2 gm/dl — ABNORMAL LOW (ref 13.0–18.0)
Immature Granulocytes: 0.7 % (ref 0.0–3.0)
Lymphocytes: 19.3 % — ABNORMAL LOW (ref 28–48)
MCH: 32.3 pg (ref 23.0–34.6)
MCHC: 32.3 gm/dl (ref 30.0–36.0)
MCV: 100 fL — ABNORMAL HIGH (ref 80.0–98.0)
MPV: 11.1 fL — ABNORMAL HIGH (ref 6.0–10.0)
Monocytes: 8.8 % (ref 1–13)
Neutrophils Segmented: 70.1 % — ABNORMAL HIGH (ref 34–64)
Nucleated RBCs: 0 (ref 0–0)
Platelets: 183 10*3/uL (ref 140–450)
RBC: 2.54 M/uL — ABNORMAL LOW (ref 3.80–5.70)
RDW: 56.2 — ABNORMAL HIGH (ref 35.1–43.9)
WBC: 9.5 10*3/uL (ref 4.0–11.0)

## 2022-05-16 LAB — COMPREHENSIVE METABOLIC PANEL
ALT: 48 U/L (ref 10–49)
AST: 39 U/L — ABNORMAL HIGH (ref 0.0–33.9)
Albumin: 3.2 gm/dl — ABNORMAL LOW (ref 3.4–5.0)
Alkaline Phosphatase: 90 U/L (ref 46–116)
Anion Gap: 8 mmol/L (ref 5–15)
BUN: 36 mg/dl — ABNORMAL HIGH (ref 9–23)
CO2: 25 mEq/L (ref 20–31)
Calcium: 8.7 mg/dl (ref 8.7–10.4)
Chloride: 104 mEq/L (ref 98–107)
Creatinine: 1.51 mg/dl — ABNORMAL HIGH (ref 0.70–1.30)
GFR African American: 58
GFR Non-African American: 48
Glucose: 132 mg/dl — ABNORMAL HIGH (ref 74–106)
Potassium: 5 mEq/L (ref 3.5–5.1)
Sodium: 137 mEq/L (ref 136–145)
Total Bilirubin: 0.7 mg/dl (ref 0.30–1.20)
Total Protein: 6 gm/dl (ref 5.7–8.2)

## 2022-05-16 LAB — PROTIME-INR
INR: 1.1 (ref 0.1–1.1)
Protime: 13.1 seconds — ABNORMAL HIGH (ref 10.2–12.9)

## 2022-05-16 LAB — ABO/RH: ABO/Rh: A NEG

## 2022-05-16 LAB — ANTIBODY SCREEN: Antibody Screen: NEGATIVE

## 2022-05-16 NOTE — Progress Notes (Signed)
[place] [facility] Dosing Services: Renal Dosing    The following medication was automatically dose-adjusted per [facility] P&T Committee Protocol, with respect to renal function.      Previous Regimen Gabapentin 1200 mg BID   Serum Creatinine Lab Results   Component Value Date/Time    CREATININE 1.51 05/16/2022 06:30 PM      Creatinine Clearance Estimated Creatinine Clearance: 36 mL/min (A) (based on SCr of 1.51 mg/dL (H)).   BUN Lab Results   Component Value Date/Time    BUN 36 05/16/2022 06:30 PM        Dose changed to Gabapentin 300mg  three times daily for a max daily dose of 900mg .    Pharmacy to continue to monitor patient daily. Will make dosage adjustments based upon changing renal function.

## 2022-05-16 NOTE — ED Provider Notes (Cosign Needed)
[facility] Care  Emergency Department Treatment Report    Patient: Tommy Brown Age: 77 y.o. Sex: male    Date of Birth: 09/11/1945 Admit Date: 05/16/2022 PCP: Cleatis Polka, MD   MRN: [id number]  CSN: [id number]  ATTENDING: Konrad Felix, MD    Room: ER36/ER36 Time Dictated: 11:30 PM APP: Balinda Quails, PA-C     Chief Complaint   Chief Complaint   Patient presents with    Rectal Bleeding       History of Present Illness   77 y.o. male with extensive past medical history significant for diverticular disease recently discharged from the hospital 05/12/2022 for rectal bleeding, CKD, CABG, hypertension, status post Whipple secondary to malignant neoplasm of the pancreas presenting to the emergency department for rectal bleeding.  Patient was recently discharged from our facility May 12, 2022 after being admitted for rectal bleeding.  His hemoglobin upon discharge was 8.9.  Was not bleeding upon discharge.  Reports that he was home and not feeling great and reports bouts of nausea but did not have rectal bleeding until this morning.  Reports that his stools were bloated this morning when he came to the emergency department he had a very large red blood per rectum bowel movement.  Reports continued rectal bleeding since then.  Denies fevers, chest pain, shortness of breath, abdominal pain, vomiting, irritative voiding symptoms, or hematuria.  Denies a history of blood thinners, has not taken his aspirin since he was discharged.  Denies alcohol use.    Review of Systems   See HPI    Past Medical/Surgical History     Past Medical History:   Diagnosis Date    Arthritis     Benign prostate hyperplasia     Bladder infection     Chronic kidney disease     Coronary arteriosclerosis     Coronary atherosclerosis of artery bypass graft     Decreased testosterone level     Diverticular disease     Essential hypertension     Gout     History of acute renal failure     History of anemia     History of  malignant neoplasm of pancreas     Hx of CABG     Kidney stone     Kidney stones     Neuropathy     NECK, SHOULDERS    Pancreatic cancer (HCC)     Sepsis (HCC)      Past Surgical History:   Procedure Laterality Date    CABG, ARTERY-VEIN, FOUR  1995    CARDIAC CATHETERIZATION  2006    CERVICAL FUSION      COLONOSCOPY N/A 07/10/2020    DIAGNOSTIC COLONOSCOPY polypectomy w/hot snare, cold snare , Clip x 1  performed by Jonna Coup, MD at [facility] ENDOSCOPY    COLONOSCOPY      CORONARY ARTERY BYPASS GRAFT  1995    LUMBAR DISCECTOMY      OTHER SURGICAL HISTORY  01/01/2011    whipple procedure done for pancreatic cancer    UROLOGICAL SURGERY  2012    Percutaneous extraction of a kidney stone w/ fragmentation procedure        Social History     Social History     Socioeconomic History    Marital status: Married   Tobacco Use    Smoking status: Never    Smokeless tobacco: Never   Vaping Use    Vaping Use: Never used   Substance  and Sexual Activity    Alcohol use: Not Currently    Drug use: Not Currently    Sexual activity: Defer   Social History Narrative    ** Merged History Encounter **            Family History     Family History   Problem Relation Age of Onset    Emphysema Brother     Alcohol Abuse Father     Heart Disease Brother        Current Medications     Previous Medications    ACETAMINOPHEN (TYLENOL) 325 MG TABLET    Take 2 tablets by mouth every 6 hours as needed    ALLOPURINOL (ZYLOPRIM) 300 MG TABLET    Take 1 tablet by mouth daily    ASPIRIN 81 MG CHEWABLE TABLET    Take 1 tablet by mouth daily    BUMETANIDE (BUMEX) 1 MG TABLET    Take 1 tablet by mouth daily Resume on 05/14/2022    GABAPENTIN (NEURONTIN) 600 MG TABLET    Take 2 tablets by mouth 2 times daily.    LIPASE-PROTEASE-AMYLASE (CREON) 24000-76000 UNITS DELAYED RELEASE CAPSULE    Take 2 capsules by mouth 3 times daily (with meals)    MELOXICAM (MOBIC) 7.5 MG TABLET    Take 1 tablet by mouth daily    METHOCARBAMOL (ROBAXIN) 500 MG TABLET    Take 1  tablet by mouth daily as needed    METOPROLOL SUCCINATE (TOPROL XL) 25 MG EXTENDED RELEASE TABLET    TAKE 1 TABLET BY MOUTH EVERY DAY    OMEPRAZOLE (PRILOSEC) 40 MG DELAYED RELEASE CAPSULE    Take 1 capsule by mouth daily    PRAVASTATIN (PRAVACHOL) 10 MG TABLET    Take 1 tablet by mouth daily    TAMSULOSIN (FLOMAX) 0.4 MG CAPSULE    Take 2 capsules by mouth daily    TEMAZEPAM (RESTORIL) 7.5 MG CAPSULE    Take 2 capsules by mouth nightly as needed for Anxiety or Sleep.         Allergies   Codeine, Oxycodone, Amoxicillin, Hydroxyzine pamoate, Meperidine, Metoclopramide, Morphine, Oxycodone-acetaminophen, Prochlorperazine, Hydroxyzine hcl, and Other      Physical Exam     ED Triage Vitals [05/16/22 1716]   BP Temp Temp Source Pulse Respirations SpO2 Height Weight - Scale   126/78 98.7 F (37.1 C) Oral 70 18 96 % 1.676 m (5\' 6" ) 63.5 kg (140 lb)           Physical Exam  Exam conducted with a chaperone present.   Constitutional:       General: He is not in acute distress.     Appearance: Normal appearance. He is ill-appearing.      Comments: Rigors on exam   HENT:      Head: Normocephalic and atraumatic.   Eyes:      Extraocular Movements: Extraocular movements intact.   Cardiovascular:      Rate and Rhythm: Normal rate and regular rhythm.      Heart sounds: Murmur heard.      No friction rub. No gallop.   Pulmonary:      Effort: Pulmonary effort is normal. No respiratory distress.      Breath sounds: Normal breath sounds. No stridor. No wheezing, rhonchi or rales.   Abdominal:      General: Abdomen is flat. A surgical scar is present. There is no distension.      Palpations: Abdomen is soft.  Tenderness: There is no abdominal tenderness. There is no guarding.   Genitourinary:     Rectum: Guaiac result positive.   Musculoskeletal:         General: Normal range of motion.      Cervical back: Normal range of motion and neck supple.      Right lower leg: No edema.      Left lower leg: No edema.   Skin:     General:  Skin is warm and dry.      Coloration: Skin is pale.   Neurological:      General: No focal deficit present.      Mental Status: He is alert and oriented to person, place, and time.   Psychiatric:         Mood and Affect: Mood normal.         Behavior: Behavior normal.         Impression and Management Plan   Patient is a 77 year old male presenting emergency department for rectal bleeding.  There is dried blood around his rectum, Hemoccult positive.  No abdominal tenderness.  Will place on the cardiac monitor and review labs that have been ordered as well as add on blood cultures lactate and Pro-Cal as he appears to have rigors.    DDx: Including but not limited to diverticular bleed versus malignancy versus anemia requiring transfusion versus sepsis  Diagnostic Studies     Results for orders placed or performed during the hospital encounter of 05/16/22   CTA ABDOMEN PELVIS W WO CONTRAST    Narrative    DICOM format image data is available to non-affiliated external healthcare  facilities or entities on a secure, media free, reciprocally searchable basis  with patient authorization for 12 months following the date of the study.    Clinical History:rectal bleeding.      Examination:   CTA scan of the abdomen and pelvis without and with contrast 05/16/2022 10:22 PM  EDT.  5 mm noncontrast spiral scanning was performed from the lung bases to the  symphysis pubis. 3 mm spiral scanning is performed with intravenous contrast in  the arterial phase from the lung bases to the symphysis pubis. 3 D MIP, coronal  and sagittal reconstruction images has been obtained.    Correlation: 01/25/2022    FINDINGS:    Lung bases are clear. Atherosclerotic calcification of the coronary arteries,  abdominal aorta and mesenteric arteries. Degenerative changes of the spine.  Penile implant. Pneumobilia likely related to sphincterotomy. Gallbladder is  absent.    Multiple small hepatic and renal hypodensities are too small to  characterize.  There are too numerous to characterize renal hypodensities. Cyst with wall  calcification lower pole left kidney. Spleen, pancreatic tail is unremarkable,  status post Whipple's.    Adrenal glands and bladder are unremarkable. Prostate is enlarged. There are  colonic diverticuli. Terminal ileum, appendix, stomach and small bowel loops are  within normal limits.    Dense atherosclerotic calcification of the abdominal aorta, mesenteric, renal  and iliac arteries. No lymph node enlargement or free fluid. Nonobstructing 4 mm  left nephrolithiasis.      Impression    IMPRESSION:  1. No active GI bleed.  2. Diverticulosis.  3. Status post Whipple's procedure.  4. Multiple hepatic and renal hypodensities some small and too numerous to  characterize.  5. 4 mm nonobstructing left nephrolithiasis.    Electronically signed by: Jolayne Panther, MD 05/16/2022 10:34 PM EDT  Workstation ID: [id number]     CBC with Auto Differential   Result Value Ref Range    WBC 9.5 4.0 - 11.0 1000/mm3    RBC 2.54 (L) 3.80 - 5.70 M/uL    Hemoglobin 8.2 (L) 13.0 - 18.0 gm/dl    Hematocrit 13.0 (L) 36.0 - 55.0 %    MCV 100.0 (H) 80.0 - 98.0 fL    MCH 32.3 23.0 - 34.6 pg    MCHC 32.3 30.0 - 36.0 gm/dl    Platelets 865 784 - 450 1000/mm3    MPV 11.1 (H) 6.0 - 10.0 fL    RDW 56.2 (H) 35.1 - 43.9      Nucleated RBCs 0 0 - 0      Immature Granulocytes 0.7 0.0 - 3.0 %    Neutrophils Segmented 70.1 (H) 34 - 64 %    Lymphocytes 19.3 (L) 28 - 48 %    Monocytes 8.8 1 - 13 %    Eosinophils 1.0 0 - 5 %    Basophils 0.1 0 - 3 %   CMP   Result Value Ref Range    Potassium 5.0 3.5 - 5.1 mEq/L    Chloride 104 98 - 107 mEq/L    Sodium 137 136 - 145 mEq/L    CO2 25 20 - 31 mEq/L    Glucose 132 (H) 74 - 106 mg/dl    BUN 36 (H) 9 - 23 mg/dl    Creatinine 6.96 (H) 0.70 - 1.30 mg/dl    GFR African American 58.0      GFR Non-African American 48      Calcium 8.7 8.7 - 10.4 mg/dl    Anion Gap 8 5 - 15 mmol/L    AST 39.0 (H) 0.0 - 33.9 U/L    ALT  48 10 - 49 U/L    Alkaline Phosphatase 90 46 - 116 U/L    Total Bilirubin 0.70 0.30 - 1.20 mg/dl    Total Protein 6.0 5.7 - 8.2 gm/dl    Albumin 3.2 (L) 3.4 - 5.0 gm/dl   Protime-INR   Result Value Ref Range    Protime 13.1 (H) 10.2 - 12.9 seconds    INR 1.1 0.1 - 1.1     Lactate, Sepsis   Result Value Ref Range    Lactate 1.9 0.5 - 2.2 mmol/L   POC Fecal Occult Blood   Result Value Ref Range    Occult Blood Fecal positive (A) NEGATIVE,Negative     ABO/RH   Result Value Ref Range    ABO/Rh A Rh Negative      Narrative    Specimen is valid for 3 days - nurse to verify valid specimen   ANTIBODY SCREEN   Result Value Ref Range    Antibody Screen NEG      Narrative    Specimen is valid for 3 days - nurse to verify valid specimen       Cardiac monitor: Ordered for rectal bleeding. My interpretation is patient is hypotensive, blood pressure 90s over 50s.  SpO2 97%.    Imaging studies independently interpreted by myself and Konrad Felix, MD. My interpretation is CT abdomen pelvis demonstrates no free air to the diaphragm, nonobstructive bowel pattern.    ED Course/Medical Decision Making   Most recent bowel movement in the emergency department was very small per his report.  Most recent bloody bowel movement was in the waiting room.  Will order repeat H&H as the patient's blood pressure  has decreased to 90s over 50s.  Will order 500 mL of fluids as well.    Consult:  Discussed care with Dr. Marilynn Rail, Specialty: GI standard discussion; including history of patient's chief complaint, available diagnostic results, and treatment course.  Patient had colonoscopy from 2022 showing sigmoid diverticulosis, GI will see the patient.  11:30 PM, Gloriann Loan, PA     Consult:  Discussed care with Dr. Meryle Ready, Specialty: Hospitalist standard discussion; including history of patient's chief complaint, available diagnostic results, and treatment course.  Patient to be admitted to stepdown.  11:30 PM, Gloriann Loan, PA                RECORDS REVIEWED:  I reviewed the patient's previous records here at [facility] and available outside facilities and note that patient was admitted to the hospital and discharged 05/09/2022 for rectal bleeding.     EXTERNAL RESULTS REVIEWED: Colonoscopy results from 2022 demonstrate sigmoid diverticulosis.    INDEPENDENT HISTORIAN:  History and/or plan development assisted by: None    Severe exacerbation or progression of chronic illness: Diverticulitis      Threat to body function without evaluation and management: Gastroenterologic, hematologic      SOCIAL DETERMINANTS  impacting Evaluation and Management: Provider availability, stress, health literacy      Comorbidities impacting Evaluation and Management: History of diverticulitis, recently discharged from [facility] for rectal bleeding      Medications   sodium chloride 0.9 % bolus 500 mL (500 mLs IntraVENous New Bag 05/16/22 2312)   ondansetron (ZOFRAN) injection 4 mg (4 mg IntraVENous Given 05/16/22 2203)   iopamidol (ISOVUE-370) 76 % injection 100 mL (100 mLs IntraVENous Given 05/16/22 2203)     Final Diagnosis     1. Rectal bleeding    2. Acute blood loss anemia        Disposition   Admission    Balinda Quails, PA-C  May 16, 2022    I hereby certify this patient for admission based upon medical necessity as noted above.    The patient was personally evaluated by myself and discussed with Konrad Felix, MD who agrees with the above assessment and plan.    My signature above authenticates this document and my orders, the final diagnosis (es), discharge prescription (s), and instructions in the Epic record. If you have any questions please contact [phone].     Nursing notes have been reviewed by the physician/ advanced practice clinician.    Dragon medical dictation software was used for portions of this report. Unintended voice recognition errors may occur.                          Gloriann Loan, [place]  05/16/22 2334       Gloriann Loan, [place]  05/16/22  [phone]

## 2022-05-16 NOTE — ED Notes (Signed)
Pt to CT at this time      Tommy Brown  05/16/22 2149

## 2022-05-16 NOTE — Other (Deleted)
Informed Consent for Blood Component Transfusion Note    I have discussed with the patient the rationale for blood component transfusion; its benefits in treating or preventing fatigue, organ damage, or death; and its risk which includes mild transfusion reactions, rare risk of blood borne infection, or more serious but rare reactions. I have discussed the alternatives to transfusion, including the risk and consequences of not receiving transfusion. The patient had an opportunity to ask questions and had agreed to proceed with transfusion of blood components.    Electronically signed by Sherri Rad, PA on 05/17/22 at 12:02 AM EDT

## 2022-05-16 NOTE — Other (Signed)
Informed Consent for Blood Component Transfusion Note    I have discussed with the patient the rationale for blood component transfusion; its benefits in treating or preventing fatigue, organ damage, or death; and its risk which includes mild transfusion reactions, rare risk of blood borne infection, or more serious but rare reactions. I have discussed the alternatives to transfusion, including the risk and consequences of not receiving transfusion. The patient had an opportunity to ask questions and had agreed to proceed with transfusion of blood components.    Electronically signed by Sherri Rad, PA on 05/17/22 at 12:02 AM EDT

## 2022-05-16 NOTE — ED Triage Notes (Signed)
Patient via EMS from home for eval of rectal bleeding. Dark red stool x 3 today. Recent discharge from this facility, dx diverticulitis. Alert oriented ambulatory

## 2022-05-16 NOTE — H&P (Addendum)
Medicine History and Physical    Patient: Tommy Brown   Age:  77 y.o.    Assessment     Acute GI bleed  Chronic anemia   CKD  CAD s/p CABG  History of pancreatic ca s/p whipple procedure    Plan     Type and screen, crossmatched PRBC transfusion as needed, maintain 2 large bore IV access at all times , Protonix IV, monitor H and H, GI consult  DVT prophylaxis-no AC due to GIB        Further recommendations based on clinical course. Discussed with patient current assessment and plan and answered all question. Thank you very much for allowing me to participate in this very pleasant patient's care.     DISPO  -Pt to be admitted  at this time for reasons addressed above, continued hospitalization for ongoing assessment and treatment indicated     Anticipated Date of Discharge: pending  Anticipated Disposition (home, SNF) : pending    Chief Complaint:   Chief Complaint   Patient presents with    Rectal Bleeding         HPI:   Tommy Brown is a 77 y.o. year old male who presents with  rectal bleeding    Patient very pleasant 77 year old male history of rectal bleeding, GI bleeding, chronic anemia, chronic kidney disease, coronary artery disease, pancreatic cancer, status post Whipple, diverticulosis, comes in with rectal bleeding.  Patient admitted last week in hospital for rectal bleeding, patient reports after he was admitted there were no further bleeding and patient was discharged.  Patient   today had 2 episodes of rectal bleeding at home, presented in the emergency room, had 2 more episodes of rectal bleeding in ER.  Hemoglobin 8.2.  Baseline hemoglobin ranging 8.2-8.9 last week.  No abdominal pain.  No chest pain.  No hematemesis.    Review of Systems -12 point review system done pertinent positive and negative as stated in HPI      Past Medical History:  Past Medical History:   Diagnosis Date    Arthritis     Benign prostate hyperplasia     Bladder infection     Chronic kidney disease     Coronary  arteriosclerosis     Coronary atherosclerosis of artery bypass graft     Decreased testosterone level     Diverticular disease     Essential hypertension     Gout     History of acute renal failure     History of anemia     History of malignant neoplasm of pancreas     Hx of CABG     Kidney stone     Kidney stones     Neuropathy     NECK, SHOULDERS    Pancreatic cancer ([place])     Sepsis ([place])        Past Surgical History:  Past Surgical History:   Procedure Laterality Date    CABG, ARTERY-VEIN, FOUR  1995    CARDIAC CATHETERIZATION  2006    CERVICAL FUSION      COLONOSCOPY N/A 07/10/2020    DIAGNOSTIC COLONOSCOPY polypectomy w/hot snare, cold snare , Clip x 1  performed by Dante Gang, MD at [facility]      OTHER SURGICAL HISTORY  01/01/2011    whipple procedure done for pancreatic cancer  UROLOGICAL SURGERY  2012    Percutaneous extraction of a kidney stone w/ fragmentation procedure        Family History:  Family History   Problem Relation Age of Onset    Emphysema Brother     Alcohol Abuse Father     Heart Disease Brother        Social History:  Social History     Socioeconomic History    Marital status: Married   Tobacco Use    Smoking status: Never    Smokeless tobacco: Never   Vaping Use    Vaping Use: Never used   Substance and Sexual Activity    Alcohol use: Not Currently    Drug use: Not Currently    Sexual activity: Defer   Social History Narrative    ** Merged History Encounter **            Home Medications:  Prior to Admission medications    Medication Sig Start Date End Date Taking? Authorizing Provider   bumetanide (BUMEX) 1 MG tablet Take 1 tablet by mouth daily Resume on 05/14/2022 05/12/22   Kristen Loader, MD   pravastatin (PRAVACHOL) 10 MG tablet Take 1 tablet by mouth daily    [provider]   meloxicam (MOBIC) 7.5 MG tablet Take 1 tablet by mouth daily    [provider]   omeprazole (PRILOSEC) 40  MG delayed release capsule Take 1 capsule by mouth daily    [provider]   methocarbamol (ROBAXIN) 500 MG tablet Take 1 tablet by mouth daily as needed 08/06/21   [provider]   acetaminophen (TYLENOL) 325 MG tablet Take 2 tablets by mouth every 6 hours as needed    [provider]   allopurinol (ZYLOPRIM) 300 MG tablet Take 1 tablet by mouth daily    Automatic Reconciliation, Ar   aspirin 81 MG chewable tablet Take 1 tablet by mouth daily    Automatic Reconciliation, Ar   gabapentin (NEURONTIN) 600 MG tablet Take 2 tablets by mouth 2 times daily.    Automatic Reconciliation, Ar   metoprolol succinate (TOPROL XL) 25 MG extended release tablet TAKE 1 TABLET BY MOUTH EVERY DAY 07/21/18   Automatic Reconciliation, Ar   lipase-protease-amylase (CREON) 24000-76000 units delayed release capsule Take 2 capsules by mouth 3 times daily (with meals)    Automatic Reconciliation, Ar   tamsulosin (FLOMAX) 0.4 MG capsule Take 2 capsules by mouth daily 07/17/18   Automatic Reconciliation, Ar   temazepam (RESTORIL) 7.5 MG capsule Take 2 capsules by mouth nightly as needed for Anxiety or Sleep.    Automatic Reconciliation, Ar       Allergies:  Allergies   Allergen Reactions    Codeine Itching and Swelling    Oxycodone Anaphylaxis    Amoxicillin Diarrhea    Hydroxyzine Pamoate Itching and Other (See Comments)     Other reaction(s): Unknown    Meperidine Itching, Other (See Comments), Palpitations and Swelling     Other reaction(s): Unknown    Metoclopramide Itching, Other (See Comments) and Swelling     Other reaction(s): Other (See Comments)  Reaction unknown   Reaction unknown     Morphine Nausea And Vomiting and Other (See Comments)    Oxycodone-Acetaminophen Itching    Prochlorperazine Itching and Other (See Comments)     Other reaction(s): Unknown (comments)  Other reaction(s): Unknown  Unsure of reaction    Hydroxyzine Hcl Hives    Other Other (See Comments)  VISTAID  ANESTHESIA            Physical Exam:   Visit Vitals  BP (!) 107/57   Pulse 93   Temp 98.1 F (36.7 C) (Oral)   Resp 16   Ht 1.676 m (5\' 6" )   Wt 63.5 kg (140 lb)   SpO2 95%   BMI 22.60 kg/m       Physical Exam:   General appearance: alert, cooperative   Head: Normocephalic   Neck: supple, trachea midline  Lungs: clear to auscultation bilaterally  Heart: regular rate and rhythm   Abdomen: soft, non-tender. Bowel sounds normal.  No rebound. No guarding  Extremities: extremities no edema  Skin: Skin dry  Neurologic: aao x 3 follow commands move all 4 ext  PSY: mood and affect normal, appropriately behaved     Intake and Output:  Current Shift:  No intake/output data recorded.  Last three shifts:  No intake/output data recorded.    Lab/Data Reviewed:  Recent Results (from the past 24 hour(s))   CBC with Auto Differential    Collection Time: 05/16/22  6:30 PM   Result Value Ref Range    WBC 9.5 4.0 - 11.0 1000/mm3    RBC 2.54 (L) 3.80 - 5.70 M/uL    Hemoglobin 8.2 (L) 13.0 - 18.0 gm/dl    Hematocrit 25.4 (L) 36.0 - 55.0 %    MCV 100.0 (H) 80.0 - 98.0 fL    MCH 32.3 23.0 - 34.6 pg    MCHC 32.3 30.0 - 36.0 gm/dl    Platelets 183 140 - 450 1000/mm3    MPV 11.1 (H) 6.0 - 10.0 fL    RDW 56.2 (H) 35.1 - 43.9      Nucleated RBCs 0 0 - 0      Immature Granulocytes 0.7 0.0 - 3.0 %    Neutrophils Segmented 70.1 (H) 34 - 64 %    Lymphocytes 19.3 (L) 28 - 48 %    Monocytes 8.8 1 - 13 %    Eosinophils 1.0 0 - 5 %    Basophils 0.1 0 - 3 %   CMP    Collection Time: 05/16/22  6:30 PM   Result Value Ref Range    Potassium 5.0 3.5 - 5.1 mEq/L    Chloride 104 98 - 107 mEq/L    Sodium 137 136 - 145 mEq/L    CO2 25 20 - 31 mEq/L    Glucose 132 (H) 74 - 106 mg/dl    BUN 36 (H) 9 - 23 mg/dl    Creatinine 1.51 (H) 0.70 - 1.30 mg/dl    GFR African American 58.0      GFR Non-African American 48      Calcium 8.7 8.7 - 10.4 mg/dl    Anion Gap 8 5 - 15 mmol/L    AST 39.0 (H) 0.0 - 33.9 U/L    ALT 48 10 - 49 U/L    Alkaline Phosphatase 90 46 - 116 U/L    Total  Bilirubin 0.70 0.30 - 1.20 mg/dl    Total Protein 6.0 5.7 - 8.2 gm/dl    Albumin 3.2 (L) 3.4 - 5.0 gm/dl   Protime-INR    Collection Time: 05/16/22  6:30 PM   Result Value Ref Range    Protime 13.1 (H) 10.2 - 12.9 seconds    INR 1.1 0.1 - 1.1     ABO/RH    Collection Time: 05/16/22  6:30 PM   Result Value Ref  Range    ABO/Rh A Rh Negative     ANTIBODY SCREEN    Collection Time: 05/16/22  6:30 PM   Result Value Ref Range    Antibody Screen NEG     POC Fecal Occult Blood    Collection Time: 05/16/22  9:10 PM   Result Value Ref Range    Occult Blood Fecal positive (A) NEGATIVE,Negative     Lactate, Sepsis    Collection Time: 05/16/22  9:25 PM   Result Value Ref Range    Lactate 1.9 0.5 - 2.2 mmol/L             Illene Regulus, MD  May 16, 2022    [facility] medical dictation software was used for portions of this report.  Unintended voice transcription errors may have occurred.

## 2022-05-16 NOTE — ED Notes (Signed)
Critical results, dr siegel notified via face to face   Hemoglobin 6.2  Hematocrit 19.1     Hart Rochester, RN  05/17/22 0000

## 2022-05-16 NOTE — ED Notes (Signed)
Assisted Erin, [place] with rectal exam. Pt requesting to use bathroom in room, hat placed in commode and patient with pull for assistance if he becomes lightheaded or dizzy.      Lenda Kelp, RN  05/16/22 2112

## 2022-05-16 NOTE — ED Notes (Signed)
Blood bank band placed on patient with 2 employee numbers, date, and time. Extra blood bank stickers attached to patient labels.     Renaee Munda [facility]  05/16/22 1857

## 2022-05-16 NOTE — ED Notes (Signed)
Pt yelled help from the bathroom in triage. Needing help getting to wheelchair. Pt wanted to show me [place] stool, pt has bright red blood in stool      Sharyn Dross, RN  05/16/22 1945

## 2022-05-17 ENCOUNTER — Inpatient Hospital Stay
Admit: 2022-05-17 | Discharge: 2022-05-23 | Disposition: A | Discharge status: Home / Self Care | Payer: Medicare Other | Admitting: Internal Medicine

## 2022-05-17 LAB — HEMOGLOBIN AND HEMATOCRIT
Hematocrit: 19.1 % — CL (ref 36.0–55.0)
Hematocrit: 27.6 % — ABNORMAL LOW (ref 36.0–55.0)
Hematocrit: 29.8 % — ABNORMAL LOW (ref 36.0–55.0)
Hematocrit: 27.5 % — ABNORMAL LOW (ref 36.0–55.0)
Hemoglobin: 6.2 gm/dl — CL (ref 13.0–18.0)
Hemoglobin: 9.2 gm/dl — ABNORMAL LOW (ref 13.0–18.0)
Hemoglobin: 10.1 gm/dl — ABNORMAL LOW (ref 13.0–18.0)
Hemoglobin: 9.4 gm/dl — ABNORMAL LOW (ref 13.0–18.0)

## 2022-05-17 LAB — POC CHEM 8
BUN: 30 mg/dl — ABNORMAL HIGH (ref 7–25)
Calcium, Ionized: 4.8 mg/dL (ref 4.40–5.40)
Chloride: 107 mEq/L (ref 98–107)
Creatinine: 1.5 mg/dl — ABNORMAL HIGH (ref 0.6–1.3)
Glucose: 127 mg/dL — ABNORMAL HIGH (ref 74–106)
Hematocrit: 23 % — ABNORMAL LOW (ref 40–54)
Hemoglobin: 7.8 gm/dl — ABNORMAL LOW (ref 12.4–17.2)
Potassium: 4.7 mEq/L (ref 3.5–4.9)
Sodium: 137 mEq/L (ref 136–145)
Total CO2: 23 mmol/L (ref 21–32)

## 2022-05-17 LAB — COMPREHENSIVE METABOLIC PANEL
ALT: 35 U/L (ref 10–49)
AST: 26 U/L (ref 0.0–33.9)
Albumin: 2.5 gm/dl — ABNORMAL LOW (ref 3.4–5.0)
Alkaline Phosphatase: 74 U/L (ref 46–116)
Anion Gap: 7 mmol/L (ref 5–15)
BUN: 35 mg/dl — ABNORMAL HIGH (ref 9–23)
CO2: 22 mEq/L (ref 20–31)
Calcium: 8.1 mg/dl — ABNORMAL LOW (ref 8.7–10.4)
Chloride: 109 mEq/L — ABNORMAL HIGH (ref 98–107)
Creatinine: 1.54 mg/dl — ABNORMAL HIGH (ref 0.70–1.30)
GFR African American: 56
GFR Non-African American: 47
Glucose: 141 mg/dl — ABNORMAL HIGH (ref 74–106)
Potassium: 4.9 mEq/L (ref 3.5–5.1)
Sodium: 138 mEq/L (ref 136–145)
Total Bilirubin: 1.1 mg/dl (ref 0.30–1.20)
Total Protein: 4.8 gm/dl — ABNORMAL LOW (ref 5.7–8.2)

## 2022-05-17 LAB — POC FECAL OCCULT BLOOD: Occult Blood Fecal: POSITIVE — AB

## 2022-05-17 LAB — PROCALCITONIN: Procalcitonin: 0.08 ng/ml (ref 0.00–0.50)

## 2022-05-17 LAB — LACTATE, SEPSIS
Lactate: 1.9 mmol/L (ref 0.5–2.2)
Lactate: 1.3 mmol/L (ref 0.5–2.2)

## 2022-05-17 MED ORDER — LIDOCAINE HCL 1 % IJ SOLN
1 % | INTRAMUSCULAR | Status: DC | PRN
  Administered 2022-05-17: 17:00:00 30 via INTRAVENOUS

## 2022-05-17 MED ORDER — LACTATED RINGERS IV SOLN
INTRAVENOUS | Status: DC | PRN
  Administered 2022-05-17: 17:00:00 via INTRAVENOUS

## 2022-05-17 MED ORDER — CALCIUM CHLORIDE 10 % IV SOLN
10 % | INTRAVENOUS | Status: DC | PRN
  Administered 2022-05-17: 17:00:00 .2 via INTRAVENOUS
  Administered 2022-05-17: 17:00:00 .3 via INTRAVENOUS
  Administered 2022-05-17: 17:00:00 .2 via INTRAVENOUS
  Administered 2022-05-17: 17:00:00 .3 via INTRAVENOUS

## 2022-05-17 MED ORDER — ONDANSETRON HCL 4 MG/2ML IJ SOLN
42 MG/2ML | Freq: Four times a day (QID) | INTRAMUSCULAR | Status: DC | PRN
Start: 2022-05-17 — End: 2022-05-23
  Administered 2022-05-17 – 2022-05-21 (×3): 4 mg via INTRAVENOUS

## 2022-05-17 MED ORDER — MIDAZOLAM HCL 2 MG/2ML IJ SOLN
2 MG/2ML | INTRAMUSCULAR | Status: DC | PRN
  Administered 2022-05-17: 17:00:00 1 via INTRAVENOUS

## 2022-05-17 MED ORDER — MIDAZOLAM 2 MG/2ML INJ (NDG ONLY)
2 | INTRAMUSCULAR | Status: AC
Start: 2022-05-17 — End: ?

## 2022-05-17 MED ORDER — SODIUM CHLORIDE 0.9 % IV SOLN
0.9 | INTRAVENOUS | Status: DC | PRN
Start: 2022-05-17 — End: 2022-05-19

## 2022-05-17 MED ORDER — KETAMINE HCL 50 MG/ML IV SOSY
50 | INTRAVENOUS | Status: AC
Start: 2022-05-17 — End: ?

## 2022-05-17 MED ORDER — PRAVASTATIN SODIUM 10 MG PO TABS
10 MG | Freq: Every day | ORAL | Status: DC
Start: 2022-05-17 — End: 2022-05-23
  Administered 2022-05-18 – 2022-05-21 (×3): 10 mg via ORAL

## 2022-05-17 MED ORDER — METHOCARBAMOL 500 MG PO TABS
500 MG | Freq: Every day | ORAL | Status: DC | PRN
Start: 2022-05-17 — End: 2022-05-23
  Administered 2022-05-21 – 2022-05-22 (×4): 500 mg via ORAL

## 2022-05-17 MED ORDER — TAMSULOSIN HCL 0.4 MG PO CAPS
0.4 MG | Freq: Every day | ORAL | Status: DC
Start: 2022-05-17 — End: 2022-05-23
  Administered 2022-05-17 – 2022-05-23 (×7): 0.8 mg via ORAL

## 2022-05-17 MED ORDER — NALOXONE HCL 0.4 MG/ML IJ SOLN
0.4 MG/ML | INTRAMUSCULAR | Status: DC | PRN
Start: 2022-05-17 — End: 2022-05-23

## 2022-05-17 MED ORDER — SODIUM CHLORIDE 0.9 % IV SOLN
0.9 | INTRAVENOUS | Status: AC
Start: 2022-05-17 — End: 2022-05-17
  Administered 2022-05-17: 04:00:00 via INTRAVENOUS

## 2022-05-17 MED ORDER — ALLOPURINOL 300 MG PO TABS
300 MG | Freq: Every day | ORAL | Status: DC
Start: 2022-05-17 — End: 2022-05-23
  Administered 2022-05-17 – 2022-05-23 (×7): 300 mg via ORAL

## 2022-05-17 MED ORDER — MELATONIN 3 MG PO TABS
3 MG | Freq: Every evening | ORAL | Status: DC | PRN
Start: 2022-05-17 — End: 2022-05-23
  Administered 2022-05-22: 02:00:00 3 mg via ORAL

## 2022-05-17 MED ORDER — KETAMINE HCL 50 MG/ML IJ SOSY
50 MG/ML | INTRAMUSCULAR | Status: DC | PRN
  Administered 2022-05-17: 17:00:00 25 via INTRAVENOUS

## 2022-05-17 MED ORDER — PANCRELIPASE (LIP-PROT-AMYL) 6000-19000 UNITS PO CPEP
6000-19000 units | Freq: Three times a day (TID) | ORAL | Status: DC
Start: 2022-05-17 — End: 2022-05-23
  Administered 2022-05-17 – 2022-05-23 (×19): 48000 [IU] via ORAL

## 2022-05-17 MED ORDER — ACETAMINOPHEN 325 MG PO TABS
325 MG | Freq: Four times a day (QID) | ORAL | Status: DC | PRN
Start: 2022-05-17 — End: 2022-05-23
  Administered 2022-05-19 – 2022-05-22 (×4): 650 mg via ORAL

## 2022-05-17 MED ORDER — ONDANSETRON HCL 4 MG/2ML IJ SOLN
4 | Freq: Once | INTRAMUSCULAR | Status: AC
Start: 2022-05-17 — End: 2022-05-16
  Administered 2022-05-17: 02:00:00 4 mg via INTRAVENOUS

## 2022-05-17 MED ORDER — SODIUM CHLORIDE (PF) 0.9 % IJ SOLN
0.9 % | Freq: Two times a day (BID) | INTRAMUSCULAR | Status: DC
Start: 2022-05-17 — End: 2022-05-23
  Administered 2022-05-17 – 2022-05-23 (×14): 40 mg via INTRAVENOUS

## 2022-05-17 MED ORDER — PROPOFOL 200 MG/20ML IV EMUL
200 MG/20ML | INTRAVENOUS | Status: DC | PRN
  Administered 2022-05-17: 17:00:00 20 via INTRAVENOUS
  Administered 2022-05-17: 17:00:00 50 via INTRAVENOUS

## 2022-05-17 MED ORDER — TEMAZEPAM 7.5 MG PO CAPS
7.5 MG | Freq: Every evening | ORAL | Status: DC | PRN
Start: 2022-05-17 — End: 2022-05-23
  Administered 2022-05-18 – 2022-05-23 (×5): 15 mg via ORAL

## 2022-05-17 MED ORDER — SODIUM CHLORIDE 0.9 % IV BOLUS
0.9 | Freq: Once | INTRAVENOUS | Status: AC
Start: 2022-05-17 — End: 2022-05-17
  Administered 2022-05-17: 03:00:00 500 mL via INTRAVENOUS

## 2022-05-17 MED ORDER — IOPAMIDOL 76 % IV SOLN
76 | Freq: Once | INTRAVENOUS | Status: AC | PRN
Start: 2022-05-17 — End: 2022-05-16
  Administered 2022-05-17: 02:00:00 100 mL via INTRAVENOUS

## 2022-05-17 MED ORDER — DEXTROSE-NACL 5-0.9 % IV SOLN
5-0.9 % | INTRAVENOUS | Status: AC
Start: 2022-05-17 — End: 2022-05-18
  Administered 2022-05-17 – 2022-05-18 (×2): via INTRAVENOUS

## 2022-05-17 MED ORDER — SODIUM CHLORIDE 0.9 % IV SOLN
0.9 % | INTRAVENOUS | Status: DC | PRN
Start: 2022-05-17 — End: 2022-05-23

## 2022-05-17 MED ORDER — GABAPENTIN 300 MG PO CAPS
300 MG | Freq: Three times a day (TID) | ORAL | Status: AC
Start: 2022-05-17 — End: 2022-05-18
  Administered 2022-05-17 – 2022-05-18 (×4): 300 mg via ORAL

## 2022-05-17 MED FILL — PRAVASTATIN SODIUM 10 MG PO TABS: 10 MG | ORAL | Qty: 1

## 2022-05-17 MED FILL — ISOVUE-370 76 % IV SOLN: 76 % | INTRAVENOUS | Qty: 100

## 2022-05-17 MED FILL — PANTOPRAZOLE SODIUM 40 MG IV SOLR: 40 MG | INTRAVENOUS | Qty: 40

## 2022-05-17 MED FILL — KETAMINE HCL 50 MG/ML IV SOSY: 50 MG/ML | INTRAVENOUS | Qty: 1

## 2022-05-17 MED FILL — TAMSULOSIN HCL 0.4 MG PO CAPS: 0.4 MG | ORAL | Qty: 2

## 2022-05-17 MED FILL — GABAPENTIN 300 MG PO CAPS: 300 MG | ORAL | Qty: 1

## 2022-05-17 MED FILL — CREON 24000-76000 UNITS PO CPEP: 24000-76000 units | ORAL | Qty: 2

## 2022-05-17 MED FILL — CREON 6000-19000 UNITS PO CPEP: 6000-19000 units | ORAL | Qty: 2

## 2022-05-17 MED FILL — SODIUM CHLORIDE 0.9 % IV SOLN: 0.9 % | INTRAVENOUS | Qty: 1000

## 2022-05-17 MED FILL — ALLOPURINOL 100 MG PO TABS: 100 MG | ORAL | Qty: 3

## 2022-05-17 MED FILL — DEXTROSE-NACL 5-0.9 % IV SOLN: INTRAVENOUS | Qty: 1000

## 2022-05-17 MED FILL — MIDAZOLAM HCL 2 MG/2ML IJ SOLN: 2 MG/ML | INTRAMUSCULAR | Qty: 2

## 2022-05-17 MED FILL — CREON 6000-19000 UNITS PO CPEP: 6000-19000 units | ORAL | Qty: 1

## 2022-05-17 MED FILL — ONDANSETRON HCL 4 MG/2ML IJ SOLN: 4 MG/2ML | INTRAMUSCULAR | Qty: 2

## 2022-05-17 NOTE — ED Notes (Signed)
Stayed w/ pt first 15 mins. Pt denies any reaction to blood thus far - pt resting comfortably sleeping. Vitally syable at this time. Pt on cardiac monitoring. No signs of any acute distress. Will continue to monitor pt - Blood rate has been increased to 200 ml/hr at this time.      Alesia Richards, RN  05/17/22 [phone]

## 2022-05-17 NOTE — Anesthesia Post-Procedure Evaluation (Signed)
Department of Anesthesiology  Postprocedure Note    Patient: Tommy Brown  MRN: [id number]  Birthdate: 03-Feb-1946  Date of evaluation: 05/17/2022    Procedure Summary       Date: 05/17/22 Room / Location: [facility] / [facility]    Anesthesia Start: 1303 Anesthesia Stop: [id number]    Procedures:       ESOPHAGOGASTRODUODENOSCOPY DIAGNOSTIC ONLY (Upper GI Region)      COLONOSCOPY DIAGNOSTIC abourt at 25cm (Lower GI Region) Diagnosis:       Gastrointestinal hemorrhage, unspecified gastrointestinal hemorrhage type      (Gastrointestinal hemorrhage, unspecified gastrointestinal hemorrhage type [K92.2])    Surgeons: Dante Gang, MD Responsible Provider: Jordan Likes, MD    Anesthesia Type: General ASA Status: 4 - Emergent            Anesthesia Type: General    Aldrete Phase I: Aldrete Score: 9    Aldrete Phase II:      Anesthesia Post Evaluation    Patient location during evaluation: bedside  Patient participation: complete - patient participated  Airway patency: patent  Nausea & Vomiting: no nausea and no vomiting  Cardiovascular status: blood pressure returned to baseline  Respiratory status: acceptable  Hydration status: stable  Pain management: adequate    No notable events documented.

## 2022-05-17 NOTE — ED Notes (Signed)
Pt completed the first unit without any suspected or notable reactions. Pt observed sleeping peacefully with eyes closed throughout the entire 1ST Unit of blood transfusion. Pt resting on monitors and continuously stable vitals. Respirations even and unlabored. Starting 2nd Unit.      Alesia Richards, RN  05/17/22 [phone]

## 2022-05-17 NOTE — Progress Notes (Signed)
INTERNAL MEDICINE PROGRESS NOTE    Date of note:      May 17, 2022    Patient:               Tommy Brown, 77 y.o., male  Admit Date:        05/16/2022  Length of Stay:  1 day(s)    Problem List:   Patient Active Problem List   Diagnosis    UTI (urinary tract infection)    History of syncope    AKI (acute kidney injury) (HCC)    Hypertensive retinopathy, bilateral    Hx of CABG    Intraductal papillary mucinous neoplasm    Vitreous degeneration, left eye    Clostridium difficile diarrhea    Syncope    Atherosclerosis of coronary artery    Chest pain    H/O neck surgery    Palpitations    Bacteremia due to Klebsiella pneumoniae    Essential hypertension    Coronary artery disease (CAD) excluded    Pneumonia    Testosterone deficiency    Cervical disc disease    Port-A-Cath in place    AR (allergic rhinitis)    Acute chest pain    Metabolic acidosis    Murmur, cardiac    Sepsis (HCC)    Liver cyst    BPH (benign prostatic hyperplasia)    Carotid artery disease (HCC)    Seasonal allergies    Malignant neoplasm of head of pancreas (HCC)    Hypercholesteremia    Puckering of macula, right eye    Tributary (branch) retinal vein occlusion, right eye, stable    Dyslipidemia    Ejection fraction < 50%    Diarrhea    Recurrent nephrolithiasis    Preop cardiovascular exam    Gout    Steatorrhea    Pneumonia due to COVID-19 virus    History of coronary artery disease    Rectal bleeding    Lower GI bleed       Subjective + interval history   Had multiple episodes of hematochezia overnight. Denies abdominal pain. Does have some nausea  Wife is at bedside     Assessment :     Acute painless hematochezia  Acute on chronic anemia due to GI bleeding  Recent hospitalization from 05/09/22 to 05/12/22 for lower GI bleeding  CKD III A  CAD s/p CABG  Hypertension  dyslipidemia  Pancreatic cancer s/p Whipple's procedure  BPH    Plan :     Continue close monitoring in step down unit    Patient was recently discharged on 05/12/22 -  presented to the ER on 3/21 for rectal bleeding   CTA  abdomen - no active GI bleed.  + diverticulosis  Hemoglobin went down to 6.2 - s/p 2 units PRBC transfusion -> Hemoglobin 9.2 currently  Monitor H/H  q6h and transfuse PRBC if hemoglobin drops below 7    BP soft. Watch closely    GI input appreciated. Plan for EGD, unprepped flex/CS today  Continue IV pantoprazole    Discussed with RN, GI team and updated pateint's wife at bedside        Objective:     Visit Vitals  BP 127/77   Pulse 87   Temp 98.5 F (36.9 C) (Temporal)   Resp 19   Ht 1.676 m (5\' 6" )   Wt 63.5 kg (140 lb)   SpO2 96%   BMI 22.60 kg/m  Intake/Output Summary (Last 24 hours) at 05/17/2022 1610  Last data filed at 05/17/2022 0536  Gross per 24 hour   Intake 1500 ml   Output --   Net 1500 ml       Physical Exam:     GEN - AAOx3, NAD  HEENT - mucous membranes dry and pale  Neck - supple, no JVD  Cardiac - RRR, S1, S2, no murmurs  Chest/Lungs - clear to auscultation without wheezes or rhonchi  Abdomen - soft, nontender,   Extremities - no clubbing/ cyanosis/ edema  Neuro - follows commands, generally frail      Current medications:       Current Facility-Administered Medications:     0.9 % sodium chloride infusion, , IntraVENous, PRN, Kizzie Bane, Erin N, PA    acetaminophen (TYLENOL) tablet 650 mg, 650 mg, Oral, Q6H PRN, Matriano, Thamas Jaegers, MD    pantoprazole (PROTONIX) 40 mg in sodium chloride (PF) 0.9 % 10 mL injection, 40 mg, IntraVENous, Q12H, Matriano, Thamas Jaegers, MD, 40 mg at 05/16/22 2354    naloxone [facility]) injection 0.4 mg, 0.4 mg, IntraVENous, PRN, Matriano, Thamas Jaegers, MD    ondansetron [facility]) injection 4 mg, 4 mg, IntraVENous, Q6H PRN, Matriano, Thamas Jaegers, MD    melatonin tablet 3 mg, 3 mg, Oral, Nightly PRN, Matriano, Thamas Jaegers, MD    allopurinol (ZYLOPRIM) tablet 300 mg, 300 mg, Oral, Daily, Matriano, Thamas Jaegers, MD, 300 mg at 05/17/22 9604    gabapentin (NEURONTIN) capsule 300 mg, 300 mg, Oral, TID, Matriano, Thamas Jaegers, MD, 300 mg at 05/17/22 0836    lipase-protease-amylase (CREON) delayed release capsule 48,000 Units, 48,000 Units, Oral, TID WC, Matriano, Thamas Jaegers, MD, 48,000 Units at 05/17/22 0834    pravastatin (PRAVACHOL) tablet 10 mg, 10 mg, Oral, Daily, Matriano, Thamas Jaegers, MD    tamsulosin [facility]) capsule 0.8 mg, 0.8 mg, Oral, Daily, Matriano, Thamas Jaegers, MD, 0.8 mg at 05/17/22 0835    temazepam (RESTORIL) capsule 15 mg, 15 mg, Oral, Nightly PRN, Matriano, Thamas Jaegers, MD    methocarbamol (ROBAXIN) tablet 500 mg, 500 mg, Oral, Daily PRN, Matriano, Thamas Jaegers, MD    Labs:     Recent Results (from the past 24 hour(s))   CBC with Auto Differential    Collection Time: 05/16/22  6:30 PM   Result Value Ref Range    WBC 9.5 4.0 - 11.0 1000/mm3    RBC 2.54 (L) 3.80 - 5.70 M/uL    Hemoglobin 8.2 (L) 13.0 - 18.0 gm/dl    Hematocrit 54.0 (L) 36.0 - 55.0 %    MCV 100.0 (H) 80.0 - 98.0 fL    MCH 32.3 23.0 - 34.6 pg    MCHC 32.3 30.0 - 36.0 gm/dl    Platelets 981 191 - 450 1000/mm3    MPV 11.1 (H) 6.0 - 10.0 fL    RDW 56.2 (H) 35.1 - 43.9      Nucleated RBCs 0 0 - 0      Immature Granulocytes 0.7 0.0 - 3.0 %    Neutrophils Segmented 70.1 (H) 34 - 64 %    Lymphocytes 19.3 (L) 28 - 48 %    Monocytes 8.8 1 - 13 %    Eosinophils 1.0 0 - 5 %    Basophils 0.1 0 - 3 %   CMP    Collection Time: 05/16/22  6:30 PM   Result Value Ref Range  Potassium 5.0 3.5 - 5.1 mEq/L    Chloride 104 98 - 107 mEq/L    Sodium 137 136 - 145 mEq/L    CO2 25 20 - 31 mEq/L    Glucose 132 (H) 74 - 106 mg/dl    BUN 36 (H) 9 - 23 mg/dl    Creatinine 6.04 (H) 0.70 - 1.30 mg/dl    GFR African American 58.0      GFR Non-African American 48      Calcium 8.7 8.7 - 10.4 mg/dl    Anion Gap 8 5 - 15 mmol/L    AST 39.0 (H) 0.0 - 33.9 U/L    ALT 48 10 - 49 U/L    Alkaline Phosphatase 90 46 - 116 U/L    Total Bilirubin 0.70 0.30 - 1.20 mg/dl    Total Protein 6.0 5.7 - 8.2 gm/dl    Albumin 3.2 (L) 3.4 - 5.0 gm/dl   Protime-INR    Collection Time: 05/16/22  6:30 PM    Result Value Ref Range    Protime 13.1 (H) 10.2 - 12.9 seconds    INR 1.1 0.1 - 1.1     ABO/RH    Collection Time: 05/16/22  6:30 PM   Result Value Ref Range    ABO/Rh A Rh Negative     ANTIBODY SCREEN    Collection Time: 05/16/22  6:30 PM   Result Value Ref Range    Antibody Screen NEG     POC Fecal Occult Blood    Collection Time: 05/16/22  9:10 PM   Result Value Ref Range    Occult Blood Fecal positive (A) NEGATIVE,Negative     Blood Culture 1    Collection Time: 05/16/22  9:25 PM    Specimen: Peripheral blood   Result Value Ref Range    BLOOD CULTURE RESULT Culture In Progress, Daily Updates To Follow     Lactate, Sepsis    Collection Time: 05/16/22  9:25 PM   Result Value Ref Range    Lactate 1.9 0.5 - 2.2 mmol/L   Blood Culture 2    Collection Time: 05/16/22  9:40 PM    Specimen: Peripheral blood   Result Value Ref Range    BLOOD CULTURE RESULT Culture In Progress, Daily Updates To Follow     Hemoglobin and Hematocrit    Collection Time: 05/16/22 11:16 PM   Result Value Ref Range    Hemoglobin 6.2 (LL) 13.0 - 18.0 gm/dl    Hematocrit 54.0 (LL) 36.0 - 55.0 %   Lactate, Sepsis    Collection Time: 05/16/22 11:35 PM   Result Value Ref Range    Lactate 1.3 0.5 - 2.2 mmol/L   PREPARE RBC (CROSSMATCH)    Collection Time: 05/17/22 12:05 AM   Result Value Ref Range    Blood unit identifier Red Blood Cells      Unit Number [id number]      Crossmatch Result Compatible      Dispense Status Blood Bank Issued      Product Code Blood Bank [id number]      Blood Type 0600      Unit Issue Date/Time 213086578469      Specimen Expiration [id number]     PREPARE RBC (CROSSMATCH), 2 Units    Collection Time: 05/17/22 12:15 AM   Result Value Ref Range    Blood unit identifier Red Blood Cells      Unit Number [id number]      Crossmatch Result Compatible  Dispense Status Blood Bank Issued      Product Code Blood Bank [id number]      Blood Type 0600      Unit Issue Date/Time 518841660630      Specimen Expiration [id number]      Procalcitonin    Collection Time: 05/17/22  6:41 AM   Result Value Ref Range    Procalcitonin 0.08 0.00 - 0.50 ng/ml   Hemoglobin and Hematocrit    Collection Time: 05/17/22  6:41 AM   Result Value Ref Range    Hemoglobin 9.2 (L) 13.0 - 18.0 gm/dl    Hematocrit 32.2 (L) 36.0 - 55.0 %   Comprehensive Metabolic Panel    Collection Time: 05/17/22  6:41 AM   Result Value Ref Range    Potassium 4.9 3.5 - 5.1 mEq/L    Chloride 109 (H) 98 - 107 mEq/L    Sodium 138 136 - 145 mEq/L    CO2 22 20 - 31 mEq/L    Glucose 141 (H) 74 - 106 mg/dl    BUN 35 (H) 9 - 23 mg/dl    Creatinine 0.25 (H) 0.70 - 1.30 mg/dl    GFR African American 56.0      GFR Non-African American 47      Calcium 8.1 (L) 8.7 - 10.4 mg/dl    Anion Gap 7 5 - 15 mmol/L    AST 26.0 0.0 - 33.9 U/L    ALT 35 10 - 49 U/L    Alkaline Phosphatase 74 46 - 116 U/L    Total Bilirubin 1.10 0.30 - 1.20 mg/dl    Total Protein 4.8 (L) 5.7 - 8.2 gm/dl    Albumin 2.5 (L) 3.4 - 5.0 gm/dl       RAD Results:  CTA ABDOMEN PELVIS W WO CONTRAST    Result Date: 05/16/2022  DICOM format image data is available to non-affiliated external healthcare facilities or entities on a secure, media free, reciprocally searchable basis with patient authorization for 12 months following the date of the study. Clinical History:rectal bleeding.  Examination: CTA scan of the abdomen and pelvis without and with contrast 05/16/2022 10:22 PM EDT.  5 mm noncontrast spiral scanning was performed from the lung bases to the symphysis pubis. 3 mm spiral scanning is performed with intravenous contrast in the arterial phase from the lung bases to the symphysis pubis. 3 D MIP, coronal and sagittal reconstruction images has been obtained. Correlation: 01/25/2022 FINDINGS: Lung bases are clear. Atherosclerotic calcification of the coronary arteries, abdominal aorta and mesenteric arteries. Degenerative changes of the spine. Penile implant. Pneumobilia likely related to sphincterotomy. Gallbladder is absent.  Multiple small hepatic and renal hypodensities are too small to characterize. There are too numerous to characterize renal hypodensities. Cyst with wall calcification lower pole left kidney. Spleen, pancreatic tail is unremarkable, status post Whipple's. Adrenal glands and bladder are unremarkable. Prostate is enlarged. There are colonic diverticuli. Terminal ileum, appendix, stomach and small bowel loops are within normal limits. Dense atherosclerotic calcification of the abdominal aorta, mesenteric, renal and iliac arteries. No lymph node enlargement or free fluid. Nonobstructing 4 mm left nephrolithiasis.     IMPRESSION: 1. No active GI bleed. 2. Diverticulosis. 3. Status post Whipple's procedure. 4. Multiple hepatic and renal hypodensities some small and too numerous to characterize. 5. 4 mm nonobstructing left nephrolithiasis. Electronically signed by: Jolayne Panther, MD 05/16/2022 10:34 PM EDT          Workstation ID: [email or web address] Honey  Total clinical care time was 42  minutes of which more than 50% was spent in coordination of care and counseling (time spent with patient/family face to face, physical exam, reviewing laboratory and imaging investigations, speaking with physicians and nursing staff involved in this patient's care).     Dragon medical dictation system was used for part of this note. Unintentional voice recognition errors may occur    Tia Masker,  MD  [facility]  May 17, 2022  Time: 9:39 AM

## 2022-05-17 NOTE — Progress Notes (Signed)
Pt's driver is wife [name] [phone]

## 2022-05-17 NOTE — Anesthesia Pre-Procedure Evaluation (Addendum)
Department of Anesthesiology  Preprocedure Note       Name:  Tommy Brown   Age:  77 y.o.  DOB:  1946/02/11                                          MRN:  [id number]         Date:  05/17/2022      Surgeon: Juliann Mule):  Dante Gang, MD    Procedure: Procedure(s):  ESOPHAGOGASTRODUODENOSCOPY DIAGNOSTIC ONLY  COLONOSCOPY DIAGNOSTIC    Medications prior to admission:   Prior to Admission medications    Medication Sig Start Date End Date Taking? Authorizing Provider   bumetanide (BUMEX) 1 MG tablet Take 1 tablet by mouth daily Resume on 05/14/2022  Patient not taking: Reported on 05/17/2022 05/12/22   Kristen Loader, MD   pravastatin (PRAVACHOL) 10 MG tablet Take 1 tablet by mouth daily  Patient not taking: Reported on 05/17/2022    [provider]   meloxicam (MOBIC) 7.5 MG tablet Take 1 tablet by mouth daily    [provider]   omeprazole (PRILOSEC) 40 MG delayed release capsule Take 1 capsule by mouth daily    [provider]   methocarbamol (ROBAXIN) 500 MG tablet Take 1 tablet by mouth daily as needed 08/06/21   [provider]   acetaminophen (TYLENOL) 325 MG tablet Take 2 tablets by mouth every 6 hours as needed    [provider]   allopurinol (ZYLOPRIM) 300 MG tablet Take 1 tablet by mouth daily    Automatic Reconciliation, Ar   aspirin 81 MG chewable tablet Take 1 tablet by mouth daily    Automatic Reconciliation, Ar   gabapentin (NEURONTIN) 600 MG tablet Take 2 tablets by mouth 4 times daily.    Automatic Reconciliation, Ar   metoprolol succinate (TOPROL XL) 25 MG extended release tablet TAKE 1 TABLET BY MOUTH EVERY DAY  Patient not taking: Reported on 05/17/2022 07/21/18   Automatic Reconciliation, Ar   lipase-protease-amylase (CREON) 24000-76000 units delayed release capsule Take 2 capsules by mouth 3 times daily (with meals)    Automatic Reconciliation, Ar   tamsulosin (FLOMAX) 0.4 MG capsule Take 2 capsules by mouth daily 07/17/18   Automatic Reconciliation, Ar    temazepam (RESTORIL) 7.5 MG capsule Take 2 capsules by mouth nightly as needed for Anxiety or Sleep.  Patient not taking: Reported on 05/17/2022    Automatic Reconciliation, Ar       Current medications:    Current Facility-Administered Medications   Medication Dose Route Frequency Provider Last Rate Last Admin   . 0.9 % sodium chloride infusion   IntraVENous PRN Sherri Rad, PA       . acetaminophen (TYLENOL) tablet 650 mg  650 mg Oral Q6H PRN Matriano, Patriciaann Clan H, MD       . dextrose 5 % and 0.9 % sodium chloride infusion   IntraVENous Continuous Simoes, Ruchita, MD 75 mL/hr at 05/17/22 1020 New Bag at 05/17/22 1020   . 0.9 % sodium chloride infusion   IntraVENous PRN Meegan Shanafelt, Isabel Caprice, MD       . pantoprazole (PROTONIX) 40 mg in sodium chloride (PF) 0.9 % 10 mL injection  40 mg IntraVENous Q12H Matriano, Crista Curb, MD   40 mg at 05/17/22 1020   . naloxone [facility]) injection 0.4 mg  0.4 mg IntraVENous  PRN Illene Regulus, MD       . ondansetron [facility]) injection 4 mg  4 mg IntraVENous Q6H PRN Illene Regulus, MD   4 mg at 05/17/22 1020   . melatonin tablet 3 mg  3 mg Oral Nightly PRN Matriano, Crista Curb, MD       . allopurinol (ZYLOPRIM) tablet 300 mg  300 mg Oral Daily Matriano, Crista Curb, MD   300 mg at 05/17/22 0835   . gabapentin (NEURONTIN) capsule 300 mg  300 mg Oral TID Illene Regulus, MD   300 mg at 05/17/22 0836   . lipase-protease-amylase (CREON) delayed release capsule 48,000 Units  48,000 Units Oral TID WC Matriano, Crista Curb, MD   48,000 Units at 05/17/22 [phone]   . pravastatin (PRAVACHOL) tablet 10 mg  10 mg Oral Daily Matriano, Crista Curb, MD       . tamsulosin [facility]) capsule 0.8 mg  0.8 mg Oral Daily Matriano, Crista Curb, MD   0.8 mg at 05/17/22 0835   . temazepam (RESTORIL) capsule 15 mg  15 mg Oral Nightly PRN Matriano, Crista Curb, MD       . methocarbamol (ROBAXIN) tablet 500 mg  500 mg Oral Daily PRN Matriano, Crista Curb, MD           Allergies:     Allergies   Allergen Reactions   . Codeine Itching and Swelling   . Oxycodone Anaphylaxis   . Amoxicillin Diarrhea   . Hydroxyzine Pamoate Itching and Other (See Comments)     Other reaction(s): Unknown   . Meperidine Itching, Other (See Comments), Palpitations and Swelling     Other reaction(s): Unknown   . Metoclopramide Itching, Other (See Comments) and Swelling     Other reaction(s): Other (See Comments)  Reaction unknown   Reaction unknown    . Morphine Nausea And Vomiting and Other (See Comments)   . Oxycodone-Acetaminophen Itching   . Prochlorperazine Itching and Other (See Comments)     Other reaction(s): Unknown (comments)  Other reaction(s): Unknown  Unsure of reaction   . Hydroxyzine Hcl Hives   . Other Other (See Comments)     VISTAID  ANESTHESIA       Problem List:    Patient Active Problem List   Diagnosis Code   . UTI (urinary tract infection) N39.0   . History of syncope Z87.898   . AKI (acute kidney injury) ([place]) N17.9   . Hypertensive retinopathy, bilateral H35.033   . 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 Vitreous degeneration, left eye H43.812   . Clostridium difficile diarrhea A04.72   . Syncope R55   . Atherosclerosis of coronary artery I25.10   . Chest pain R07.9   . H/O neck surgery Z98.890   . Palpitations R00.2   . Bacteremia due to Klebsiella pneumoniae R78.81, B96.1   . Essential hypertension I10   . Coronary artery disease (CAD) excluded Z03.89   . Pneumonia J18.9   . Testosterone deficiency E34.9   . Cervical disc disease M50.90   . Port-A-Cath in place Z95.828   . AR (allergic rhinitis) J30.9   . Acute chest pain R07.9   . Metabolic acidosis [id number]   . Murmur, cardiac R01.1   . Sepsis ([place]) A41.9   . Liver cyst K76.89   . BPH (benign prostatic hyperplasia) N40.0   . Carotid artery disease (H[facility]) I77.9   .  Seasonal allergies J30.2   . Malignant neoplasm of head of pancreas ([place]) C25.0   . Hypercholesteremia E78.00   . 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 Dyslipidemia E78.5   . Ejection fraction < 50% R94.30   . Diarrhea R19.7   . 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 Preop cardiovascular exam Z01.810   . Gout M10.9   . Steatorrhea K90.9   . Pneumonia due to COVID-19 virus U07.1, J12.82   . History of coronary artery disease Z86.79   . Rectal bleeding K62.5   . Lower GI bleed K92.2       Past Medical History:        Diagnosis Date   . Arthritis    . Benign prostate hyperplasia    . Bladder infection    . Chronic kidney disease    . Coronary arteriosclerosis    . Coronary atherosclerosis of artery bypass graft    . Decreased testosterone level    . Diverticular disease    . Essential hypertension    . Gout    . History of acute renal failure    . History of anemia    . History of malignant neoplasm of pancreas    . Hx of CABG    . Kidney stone    . Kidney stones    . Neuropathy     NECK, SHOULDERS   . Pancreatic cancer ([place])    . Sepsis [facility])        Past Surgical History:        Procedure Laterality Date   . CABG, ARTERY-VEIN, FOUR  1995   . CARDIAC CATHETERIZATION  2006   . CERVICAL FUSION     . COLONOSCOPY N/A 07/10/2020    DIAGNOSTIC COLONOSCOPY polypectomy w/hot snare, cold snare , Clip x 1  performed by Dante Gang, MD at [facility] ENDOSCOPY   . COLONOSCOPY     . CORONARY ARTERY BYPASS GRAFT  1995   . LUMBAR DISCECTOMY     . OTHER SURGICAL HISTORY  01/01/2011    whipple procedure done for pancreatic cancer   . UROLOGICAL SURGERY  2012    Percutaneous extraction of a kidney stone w/ fragmentation procedure        Social History:    Social History     Tobacco Use   . Smoking status: Never   . Smokeless tobacco: Never   Substance Use Topics   . Alcohol use: Not Currently                                Counseling given: Not Answered      Vital Signs (Current):   Vitals:    05/17/22 0926 05/17/22 1000 05/17/22 1130 05/17/22 1149   BP: 127/77 110/67  121/67   Pulse: 87 86  88   Resp: 19 14   18    Temp:   98.9 F (37.2 C) 98.3 F (36.8 C)   TempSrc:   Temporal Oral   SpO2: 96% 96%  99%   Weight:    63.5 kg (140 lb)   Height:    1.676 m (5\' 6" )  BP Readings from Last 3 Encounters:   05/17/22 121/67   05/12/22 (!) 111/59   07/10/20 (!) 163/88       NPO Status: Time of last liquid consumption: 0900                        Time of last solid consumption: 1500                        Date of last liquid consumption: 05/17/22                        Date of last solid food consumption: 05/16/22    BMI:   Wt Readings from Last 3 Encounters:   05/17/22 63.5 kg (140 lb)   05/09/22 63.5 kg (140 lb)   02/14/22 61.7 kg (136 lb)     Body mass index is 22.6 kg/m.    CBC:   Lab Results   Component Value Date/Time    WBC 9.5 05/16/2022 06:30 PM    RBC 2.54 05/16/2022 06:30 PM    HGB 9.2 05/17/2022 06:41 AM    HCT 27.6 05/17/2022 06:41 AM    MCV 100.0 05/16/2022 06:30 PM    RDW 56.2 05/16/2022 06:30 PM    PLT 183 05/16/2022 06:30 PM       CMP:   Lab Results   Component Value Date/Time    NA 138 05/17/2022 06:41 AM    K 4.9 05/17/2022 06:41 AM    CL 109 05/17/2022 06:41 AM    CO2 22 05/17/2022 06:41 AM    CO2 23.0 01/12/2019 05:27 AM    BUN 35 05/17/2022 06:41 AM    CREATININE 1.54 05/17/2022 06:41 AM    GFRAA 56.0 05/17/2022 06:41 AM    LABGLOM 47 05/17/2022 06:41 AM    GLUCOSE 141 05/17/2022 06:41 AM    PROT 8.0 09/25/2020 03:00 PM    CALCIUM 8.1 05/17/2022 06:41 AM    BILITOT 1.10 05/17/2022 06:41 AM    ALKPHOS 74 05/17/2022 06:41 AM    AST 26.0 05/17/2022 06:41 AM    ALT 35 05/17/2022 06:41 AM       POC Tests: No results for input(s): "POCGLU", "POCNA", "POCK", "POCCL", "POCBUN", "POCHEMO", "POCHCT" in the last 72 hours.    Coags:   Lab Results   Component Value Date/Time    PROTIME 13.1 05/16/2022 06:30 PM    INR 1.1 05/16/2022 06:30 PM       HCG (If Applicable): No results found for: "PREGTESTUR", "PREGSERUM", "HCG", "HCGQUANT"     ABGs: No results found for: "PHART",  "PO2ART", "PCO2ART", "HCO3ART", "BEART", "O2SATART"     Type & Screen (If Applicable):  No results found for: "LABABO", "LABRH"    Drug/Infectious Status (If Applicable):  No results found for: "HIV", "HEPCAB"    COVID-19 Screening (If Applicable):   Lab Results   Component Value Date/Time    COVID19 Not Detected 07/24/2018 12:19 PM    COVID19 NEGATIVE 07/15/2018 02:05 PM           Anesthesia Evaluation    Airway: Mallampati: II  TM distance: >3 FB   Neck ROM: full  Mouth opening: > = 3 FB   Dental: normal exam         Pulmonary:normal exam    (+) pneumonia:               (-) COPD, asthma, sleep  apnea and not a current smoker                           Cardiovascular:  Exercise tolerance: good (>4 METS)  (+) hypertension:, CAD:, CABG/stent:        Rhythm: regular                   ROS comment: CABG 4V 1995     Neuro/Psych:      (-) seizures, TIA and CVA           GI/Hepatic/Renal:   (+) GERD: well controlled, liver disease:, renal disease: CRI and kidney stones          Endo/Other:    (+) malignancy/cancer.    (-) diabetes mellitus, hypothyroidism, hyperthyroidism                ROS comment: Pancreatic cancer 12 years ago- cancer free Abdominal:             Vascular: negative vascular ROS.         Other Findings:       Anesthesia Plan      general     ASA 4 - emergent     (Pt had large bloody BM before coming down- needs blood now.  I stat shows CBC 7.8 hemoglobin)  Induction: intravenous.                        Gaylene Brooks, MD   05/17/2022

## 2022-05-17 NOTE — Procedures (Signed)
EGD 05/17/22  Normal EGD    Flex sig 05/17/22  Large amount of old blood and unable to adequately visualize the colonic mucosa.  Scope not advanced beyond the sigmoid colon at 20cm.  No active bleeding seen    Recs:  Monitor H/H  If recurrent GI bleeding then check stat CTA abdomen and pelvis    Georgianne Fick. Lucien Mons, M.D.

## 2022-05-17 NOTE — Care Coordination-Inpatient (Signed)
Discharge plan: Home with family assistance. Monitor for any needs. Spouse will transport home.        05/17/22 1428   Service Assessment   Patient Orientation Alert and Oriented   Cognition Alert   History Provided By Patient   Primary Caregiver Self   Support Systems Spouse/Significant Other   Patient's Healthcare Decision Maker is: Named in [place]   PCP Verified by CM Yes   Last Visit to PCP Within last 3 months   Prior Functional Level Independent in ADLs/IADLs   Current Functional Level Independent in ADLs/IADLs   Can patient return to prior living arrangement Yes   Ability to make needs known: Good   Family able to assist with home care needs: Yes   Would you like for me to discuss the discharge plan with any other family members/significant others, and if so, who? Yes  (Spouse)   Financial Resources [employer] Resources None   CM/SW Referral Disease Management Education   Social/Functional History   Lives With Spouse   Type of Home Apartment   Home Layout One level   Home Access Level entry   Bathroom Shower/Tub Tub/Shower unit   Publishing copy None   ADL Assistance Independent   Copywriter, advertising No   Medical laboratory scientific officer Yes   Mode of Scientist, product/process development   Occupation Retired   Dentist   Type of Residence Apartment   Living Arrangements Spouse/Significant Other   Current Services Prior To Admission None   Potential Assistance Needed N/A   DME Ordered? No   Potential Assistance Purchasing Medications No   Type of Home Care Services None   Patient expects to be discharged to: Apartment   One/Two Story Residence One story   History of falls? 0   Services At/After Discharge   Transition of Care Consult (CM Consult) Discharge [place] Discharge None   Veteran Resource Information Provided?  No   Mode of Transport at Discharge BLS  (Spouse wil transport home)   Confirm Follow Up Transport Family   Condition of Participation: Discharge Planning   The Patient and/or Patient Representative was provided with a Choice of Provider? Patient   The Patient and/Or Patient Representative agree with the Discharge Plan? Yes   Freedom of Choice list was provided with basic dialogue that supports the patient's individualized plan of care/goals, treatment preferences, and shares the quality data associated with the providers?  Yes

## 2022-05-17 NOTE — Progress Notes (Signed)
Notified patient's hemoglobin was 6.2.  Ordered 2 units of packed RBCs.  Informed consent obtained.

## 2022-05-17 NOTE — Other (Signed)
TRANSFER - OUT REPORT:    Verbal report given to [place], RN on [place]  being transferred to 5SDU for routine post-op       Report consisted of patient's Situation, Background, Assessment and   Recommendations(SBAR).     Information from the following report(s) Nurse Handoff Report, Adult Overview, Surgery Report, and Recent Results was reviewed with the receiving nurse.           Lines:   Peripheral IV 05/16/22 Right Antecubital (Active)   Site Assessment Clean, dry & intact 05/17/22 1337   Line Status Flushed;Infusing 05/17/22 1337   Line Care Connections checked and tightened 05/17/22 1204   Phlebitis Assessment No symptoms 05/17/22 1337   Infiltration Assessment 0 05/17/22 1337   Alcohol Cap Used Yes 05/17/22 1337   Dressing Status Clean, dry & intact 05/17/22 1337   Dressing Type Transparent 05/17/22 1337       Peripheral IV 05/17/22 Left;Posterior Hand (Active)   Site Assessment Clean, dry & intact 05/17/22 1338   Line Status Infusing 05/17/22 1338   Phlebitis Assessment No symptoms 05/17/22 1338   Infiltration Assessment 0 05/17/22 1338   Alcohol Cap Used Yes 05/17/22 1338   Dressing Status Clean, dry & intact 05/17/22 1338   Dressing Type Transparent 05/17/22 1338        Opportunity for questions and clarification was provided.      Patient transported with:  Monitor and Registered Nurse

## 2022-05-17 NOTE — Consults (Signed)
Consult Note    Patient:  Tommy Brown  Date of Birth:  09/14/1945  Age: 77 y.o.  Sex: male  PCP: Daralene Milch, MD  MRN: [id number]        IMPRESSION:   GIB--painless hematochezia, suspect diverticular, consider brisk upper: CTA neg for e/o active bleeding  Acute Anemia s/2 #1 requiring transfusion for hgb 6.2  Dysphagia w/ solids   Pancreatic cancer s/p Whipple  AKI  HTN/HLD  CAD s/p CABG, daily ASA   BPH  Chronic neuropathy  Gout        EGD/CS, Dr. Lucien Mons 06/2020:  E/o prior surgery, o/w nml, esophageal bx unremarkable  Diverticulosis, polyps(adenomatous polyps), IH  Patient Active Problem List    Diagnosis Date Noted    Rectal bleeding 05/09/2022    Lower GI bleed 05/09/2022    Hypertensive retinopathy, bilateral 11/17/2019    Vitreous degeneration, left eye 11/17/2019    Puckering of macula, right eye 11/17/2019    Tributary (branch) retinal vein occlusion, right eye, stable 11/17/2019    Acute chest pain 07/21/2019    History of coronary artery disease 07/21/2019    Chest pain 05/03/2019    Palpitations 05/03/2019    Cervical disc disease 05/03/2019    Carotid artery disease ([place]) 05/03/2019    Dyslipidemia 05/03/2019    Ejection fraction < 50% 05/03/2019    Preop cardiovascular exam 05/03/2019    Pneumonia due to COVID-19 virus 01/10/2019    UTI (urinary tract infection) 07/15/2018    AKI (acute kidney injury) ([place]) [id number]    Metabolic acidosis [id number]    Sepsis ([place]) 01/17/2018    Testosterone deficiency 01/15/2017    AR (allergic rhinitis) 01/15/2017    Gout 01/15/2017    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    Liver cyst 07/11/2016    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    Murmur, cardiac 01/03/2015    Port-A-Cath in place 02/01/2013    Syncope 01/05/2013    Atherosclerosis of coronary artery 01/05/2013    H/O neck surgery 01/05/2013    Pneumonia 01/05/2013    BPH (benign prostatic hyperplasia) 01/05/2013    Seasonal allergies 01/05/2013    Hypercholesteremia 01/05/2013    Diarrhea  01/05/2013    History of syncope 09/05/2011    Steatorrhea 02/28/2011    Bacteremia due to Klebsiella pneumoniae 01/23/2011    Malignant neoplasm of head of pancreas ([place]) 01/21/2011    Clostridium difficile diarrhea 01/10/2011    Essential hypertension 12/21/2010    Coronary artery disease (CAD) excluded 12/21/2010    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 The patient is aware of potential complications including but not limited to bleeding, perforation, aspiration, infection, sedation adverse events, missed diagnosis, missed lesions, intravenous site complications, potential need for additional procedures, and death. All questions were answered   HPI:     We were asked by Dr. Ninetta Lights to evaluate Tommy Brown for GIB. He  is a 77 y.o. male admitted on 05/16/2022 for Acute blood loss anemia [D62]  Rectal bleeding [K62.5].   Seen for GI bleeding, painless hematochezia 05/10/22, suspected  s/2 diverticular bleed (neg EGD 22' and CS w/ diverticulosis). His h/h remained stable and he was d/c over weekend after bleeding resolved. H/h 3/17: 8.9/27.2. Returned 3/21 after having several episodes of rectal bleeding, then mult episodes on arrival to ER. Hgb on presentation 8.2, no abd pain. CTA showed diverticulosis but no e/o active bleeding. H/h down to 6.2/19.1 for which he was transfused 2 units PRBC laset night. Hgb this am 9.2.  He reports that after recent admit he has felt fatigued and "not himself' per wife. + nausea, no vomiting. He has had mult episodes gross blood filling toilet. Last passed blood this am 7:30, reports needing to  go again now. Denies abd pain. On daily ASA no other NSAIDS or blood thinners. He has hx GERD for which he takes Prilosec but has not taken for a week now. He reports he has been having difficulty swallowing solids, feels like food gets "stuck" mid esophagus but will eventually pass after ~ 15 min then he can cont meal. No episodes vomiting or hematemesis. No pain w/ swallowing.   Allergies   Allergen Reactions    Codeine Itching and Swelling    Oxycodone Anaphylaxis    Amoxicillin Diarrhea    Hydroxyzine Pamoate Itching and Other (See Comments)     Other reaction(s): Unknown    Meperidine Itching, Other (See Comments), Palpitations and Swelling     Other reaction(s): Unknown    Metoclopramide Itching, Other (See Comments) and Swelling     Other reaction(s): Other (See Comments)  Reaction unknown   Reaction unknown     Morphine Nausea And Vomiting and Other (See Comments)    Oxycodone-Acetaminophen Itching    Prochlorperazine Itching and Other (See Comments)     Other reaction(s): Unknown (comments)  Other reaction(s): Unknown  Unsure of reaction    Hydroxyzine Hcl Hives    Other Other (See Comments)     VISTAID  ANESTHESIA       Prior to Admission medications    Medication Sig Start Date End Date Taking? Authorizing Provider   bumetanide (BUMEX) 1 MG tablet Take 1 tablet by mouth daily Resume on 05/14/2022  Patient not taking: Reported on 05/17/2022 05/12/22   Kristen Loader, MD   pravastatin (PRAVACHOL) 10 MG tablet Take 1 tablet by mouth daily    [provider]   meloxicam (MOBIC) 7.5 MG tablet Take 1 tablet by mouth daily    [provider]   omeprazole (PRILOSEC) 40 MG delayed release capsule Take 1 capsule by mouth daily    [provider]   methocarbamol (ROBAXIN) 500 MG tablet Take 1 tablet by mouth daily as needed 08/06/21   [provider]   acetaminophen (TYLENOL) 325 MG tablet Take 2 tablets by mouth every 6 hours as needed    [provider]   allopurinol  (ZYLOPRIM) 300 MG tablet Take 1 tablet by mouth daily    Automatic Reconciliation, Ar   aspirin 81 MG chewable tablet Take 1 tablet by mouth daily    Automatic Reconciliation, Ar   gabapentin (NEURONTIN) 600 MG tablet Take 2 tablets by mouth 2 times daily.    Automatic Reconciliation, Ar   metoprolol succinate (TOPROL XL) 25 MG extended release tablet TAKE 1 TABLET BY MOUTH EVERY DAY 07/21/18   Automatic Reconciliation, Ar   lipase-protease-amylase (CREON) 24000-76000 units delayed release capsule Take 2 capsules by mouth 3 times daily (with meals)    Automatic Reconciliation, Ar   tamsulosin (FLOMAX) 0.4  MG capsule Take 2 capsules by mouth daily 07/17/18   Automatic Reconciliation, Ar   temazepam (RESTORIL) 7.5 MG capsule Take 2 capsules by mouth nightly as needed for Anxiety or Sleep.    Automatic Reconciliation, Ar        Current Facility-Administered Medications   Medication Dose Route Frequency Provider Last Rate Last Admin    0.9 % sodium chloride infusion   IntraVENous PRN Sherri Rad, PA        acetaminophen (TYLENOL) tablet 650 mg  650 mg Oral Q6H PRN Matriano, Crista Curb, MD        pantoprazole (PROTONIX) 40 mg in sodium chloride (PF) 0.9 % 10 mL injection  40 mg IntraVENous Q12H Matriano, Crista Curb, MD   40 mg at 05/16/22 2354    0.9 % sodium chloride infusion   IntraVENous Continuous Matriano, Crista Curb, MD 50 mL/hr at 05/16/22 2353 New Bag at 05/16/22 2353    naloxone [facility]) injection 0.4 mg  0.4 mg IntraVENous PRN Matriano, Crista Curb, MD        ondansetron [facility]) injection 4 mg  4 mg IntraVENous Q6H PRN Matriano, Crista Curb, MD        melatonin tablet 3 mg  3 mg Oral Nightly PRN Matriano, Crista Curb, MD        allopurinol (ZYLOPRIM) tablet 300 mg  300 mg Oral Daily Matriano, Crista Curb, MD        gabapentin (NEURONTIN) capsule 300 mg  300 mg Oral TID Matriano, Crista Curb, MD        lipase-protease-amylase (CREON) delayed release capsule 48,000 Units  48,000 Units Oral TID WC Matriano,  Crista Curb, MD        pravastatin (PRAVACHOL) tablet 10 mg  10 mg Oral Daily Matriano, Crista Curb, MD        tamsulosin (FLOMAX) capsule 0.8 mg  0.8 mg Oral Daily Matriano, Crista Curb, MD        temazepam (RESTORIL) capsule 15 mg  15 mg Oral Nightly PRN Matriano, Crista Curb, MD        methocarbamol (ROBAXIN) tablet 500 mg  500 mg Oral Daily PRN Matriano, Crista Curb, MD            Past Medical History:   Diagnosis Date    Arthritis     Benign prostate hyperplasia     Bladder infection     Chronic kidney disease     Coronary arteriosclerosis     Coronary atherosclerosis of artery bypass graft     Decreased testosterone level     Diverticular disease     Essential hypertension     Gout     History of acute renal failure     History of anemia     History of malignant neoplasm of pancreas     Hx of CABG     Kidney stone     Kidney stones     Neuropathy     NECK, SHOULDERS    Pancreatic cancer ([place])     Sepsis ([place])        Past Surgical History:   Procedure Laterality Date    CABG, ARTERY-VEIN, FOUR  1995    CARDIAC CATHETERIZATION  2006    CERVICAL FUSION      COLONOSCOPY N/A 07/10/2020    DIAGNOSTIC COLONOSCOPY polypectomy w/hot snare, cold snare , Clip x 1  performed by Dante Gang, MD  at [facility]      OTHER SURGICAL HISTORY  01/01/2011    whipple procedure done for pancreatic cancer    UROLOGICAL SURGERY  2012    Percutaneous extraction of a kidney stone w/ fragmentation procedure        Family History   Problem Relation Age of Onset    Emphysema Brother     Alcohol Abuse Father     Heart Disease Brother        Social History     Tobacco Use    Smoking status: Never    Smokeless tobacco: Never   Vaping Use    Vaping Use: Never used   Substance Use Topics    Alcohol use: Not Currently    Drug use: Not Currently        Pertinent items are noted in HPI.    Vitals:    05/17/22 0201 05/17/22 0231 05/17/22 0402 05/17/22 0530   BP: (!)  118/59 112/61 118/63 118/66   Pulse: 80 79 75 76   Resp: 16 17 12 12    Temp:   98.7 F (37.1 C)    TempSrc:       SpO2: 95% 96% 97% 94%   Weight:       Height:           Physical Exam:  General Appearance: alert and oriented to person, place and time, NAD, wife bedside   Skin: warm and dry  Eyes: sclera anicteric  Neck: neck supple and non tender without mass   Pulmonary/Chest: nonlabored RA  Cardiovascular: normal S1 and S2  Abdomen: soft, non-tender  Extremities: no edema  Neurologic: speech normal        DATA    CBC w/Diff   Lab Results   Component Value Date/Time    WBC 9.5 05/16/2022 06:30 PM    WBC 5.9 05/12/2022 04:15 AM    WBC 5.7 05/11/2022 10:49 AM    RBC 2.54 (L) 05/16/2022 06:30 PM    RBC 2.59 (L) 05/12/2022 04:15 AM    RBC 2.64 (L) 05/11/2022 10:49 AM    HGB 9.2 (L) 05/17/2022 06:41 AM    HGB 6.2 (LL) 05/16/2022 11:16 PM    HGB 8.2 (L) 05/16/2022 06:30 PM    HCT 27.6 (L) 05/17/2022 06:41 AM    HCT 19.1 (LL) 05/16/2022 11:16 PM    HCT 25.4 (L) 05/16/2022 06:30 PM    MCV 100.0 (H) 05/16/2022 06:30 PM    MCV 98.5 (H) 05/12/2022 04:15 AM    MCV 101.1 (H) 05/11/2022 10:49 AM    MCH 32.3 05/16/2022 06:30 PM    MCH 31.7 05/12/2022 04:15 AM    MCH 32.6 05/11/2022 10:49 AM    MCHC 32.3 05/16/2022 06:30 PM    MCHC 32.2 05/12/2022 04:15 AM    MCHC 32.2 05/11/2022 10:49 AM    RDW 56.2 (H) 05/16/2022 06:30 PM    RDW 50.8 (H) 05/12/2022 04:15 AM    RDW 53.7 (H) 05/11/2022 10:49 AM    PLT 183 05/16/2022 06:30 PM    PLT 114 (L) 05/12/2022 04:15 AM    PLT 117 (L) 05/11/2022 10:49 AM    MPV 11.1 (H) 05/16/2022 06:30 PM    MPV 11.8 (H) 05/12/2022 04:15 AM    MPV 11.4 (H) 05/11/2022 10:49 AM     Lab Results   Component Value Date/Time  MONOS 8.8 05/16/2022 06:30 PM    MONOS 5.6 05/12/2022 04:15 AM    MONOS 5.6 05/11/2022 10:49 AM        Hepatic Function   Recent Labs     05/16/22  1830   ALT 48   AST 39.0*   TP 6.0        Lab Results   Component Value Date/Time    ALKPHOS 90 05/16/2022 06:30 PM    ALT 48 05/16/2022  06:30 PM    AST 39.0 05/16/2022 06:30 PM    PROT 8.0 09/25/2020 03:00 PM    BILITOT 0.70 05/16/2022 06:30 PM    BILIDIR 0.2 06/25/2018 02:25 AM    LABALBU 3.2 05/16/2022 06:30 PM       Basic Metabolic Profile   Recent Labs     05/16/22  1830   NA 137   K 5.0   CL 104   CO2 25   BUN 36*        Lab Results   Component Value Date/Time    LABALBU 3.2 05/16/2022 06:30 PM     Lab Results   Component Value Date/Time    LIPASE 21 09/25/2020 03:00 PM     Coagulation   Recent Labs     05/16/22  1830   INR 1.1   PROTIME 13.1*          Radiology Results  CTA ABDOMEN PELVIS W WO CONTRAST    Result Date: 05/16/2022  DICOM format image data is available to non-affiliated external healthcare facilities or entities on a secure, media free, reciprocally searchable basis with patient authorization for 12 months following the date of the study. Clinical History:rectal bleeding.  Examination: CTA scan of the abdomen and pelvis without and with contrast 05/16/2022 10:22 PM EDT.  5 mm noncontrast spiral scanning was performed from the lung bases to the symphysis pubis. 3 mm spiral scanning is performed with intravenous contrast in the arterial phase from the lung bases to the symphysis pubis. 3 D MIP, coronal and sagittal reconstruction images has been obtained. Correlation: 01/25/2022 FINDINGS: Lung bases are clear. Atherosclerotic calcification of the coronary arteries, abdominal aorta and mesenteric arteries. Degenerative changes of the spine. Penile implant. Pneumobilia likely related to sphincterotomy. Gallbladder is absent. Multiple small hepatic and renal hypodensities are too small to characterize. There are too numerous to characterize renal hypodensities. Cyst with wall calcification lower pole left kidney. Spleen, pancreatic tail is unremarkable, status post Whipple's. Adrenal glands and bladder are unremarkable. Prostate is enlarged. There are colonic diverticuli. Terminal ileum, appendix, stomach and small bowel loops are within  normal limits. Dense atherosclerotic calcification of the abdominal aorta, mesenteric, renal and iliac arteries. No lymph node enlargement or free fluid. Nonobstructing 4 mm left nephrolithiasis.     IMPRESSION: 1. No active GI bleed. 2. Diverticulosis. 3. Status post Whipple's procedure. 4. Multiple hepatic and renal hypodensities some small and too numerous to characterize. 5. 4 mm nonobstructing left nephrolithiasis. Electronically signed by: Kathlene Cote, MD 05/16/2022 10:34 PM EDT          Workstation ID: [id number]         Cheral Almas, NP-C  [place]  Cell # [phone]  Office # [phone]  [address], [place], [place]

## 2022-05-17 NOTE — ED Notes (Signed)
Right before taking patient upstairs pt had his 3rd bloody bowel movement. This BM was large, bloody & liquidy. Pt cleaned and placed into gown. Pt endorsing nausea. Pt vitally stable - reported to [employer] on step down floor whose taking over care.      Alesia Richards, RN  05/17/22 [phone]

## 2022-05-17 NOTE — ED Notes (Signed)
TRANSFER - OUT REPORT:    Verbal report given to Marcello Moores, RN  on [place] being transferred to [place] for change in patient condition ( )       Report consisted of patient's Situation, Background, Assessment and   Recommendations(SBAR).     Information from the following report(s) Nurse Handoff Report, ED SBAR, Adult Overview, and MAR was reviewed with the receiving nurse.  Kinder Assessment: Presents to emergency department  because of falls (Syncope, seizure, or loss of consciousness): No, Age > 53: No, Altered Mental Status, Intoxication with alcohol or substance confusion (Disorientation, impaired judgment, poor safety awaremess, or inability to follow instructions): No, Impaired Mobility: Ambulates or transfers with assistive devices or assistance; Unable to ambulate or transer.: No, Nursing Judgement: No  Lines:   Peripheral IV 05/16/22 Right Antecubital (Active)   Site Assessment Clean, dry & intact 05/16/22 1850   Line Status Specimen collected;Normal saline locked 05/16/22 1850   Phlebitis Assessment No symptoms 05/16/22 1850   Infiltration Assessment 0 05/16/22 1850   Dressing Status Clean, dry & intact 05/16/22 1850   Dressing Type Transparent 05/16/22 1850      Medications sent with patient from pharmacy: Yes  Patient belongings: Belongings sent to floor    Opportunity for questions and clarification was provided.      Patient transported with:  Registered Nurse          Alesia Richards, RN  05/17/22 [phone]

## 2022-05-17 NOTE — ED Notes (Signed)
IV start unsuccessful x2, patient was very jumpy during needle insertion. Very apologetic as well.      Lenna Sciara, RN  05/17/22 [phone]

## 2022-05-17 NOTE — ED Notes (Signed)
Attempted to call unit - Lauren RN on unit asked to give her 5mins as she is in the middle of pt care with a pt with CP. When ask if I could give a give a quick report she just asked if give a call back.      Alesia Richards, RN  05/17/22 [phone]

## 2022-05-18 ENCOUNTER — Inpatient Hospital Stay: Admit: 2022-05-18 | Payer: MEDICARE | Primary: Family Medicine

## 2022-05-18 LAB — BASIC METABOLIC PANEL
Anion Gap: 3 mmol/L — ABNORMAL LOW (ref 5–15)
BUN: 21 mg/dl (ref 9–23)
CO2: 24 mEq/L (ref 20–31)
Calcium: 8.2 mg/dl — ABNORMAL LOW (ref 8.7–10.4)
Chloride: 112 mEq/L — ABNORMAL HIGH (ref 98–107)
Creatinine: 1.24 mg/dl (ref 0.70–1.30)
GFR African American: >60
GFR Non-African American: 60
Glucose: 102 mg/dl (ref 74–106)
Potassium: 4.7 mEq/L (ref 3.5–5.1)
Sodium: 139 mEq/L (ref 136–145)

## 2022-05-18 LAB — PREPARE RBC (CROSSMATCH)
Blood Type: 600
Blood Type: 600
Blood Type: 600
Blood Type: 600
Blood Type: 600
Blood Type: 600
Dispense Status Blood Bank: TRANSFUSED
Dispense Status Blood Bank: TRANSFUSED
Dispense Status Blood Bank: TRANSFUSED
Dispense Status Blood Bank: TRANSFUSED
Specimen Expiration: 202403242359
Specimen Expiration: 202403242359
Specimen Expiration: 202403242359
Specimen Expiration: 202403242359
Specimen Expiration: 202403242359
Specimen Expiration: 202403242359
Unit Issue Date/Time: 202403220105
Unit Issue Date/Time: 202403220350
Unit Issue Date/Time: 202403221244
Unit Issue Date/Time: 202403221244

## 2022-05-18 LAB — CBC WITH AUTO DIFFERENTIAL
Basophils: 0.2 % (ref 0–3)
Eosinophils: 2.5 % (ref 0–5)
Hematocrit: 29.3 % — ABNORMAL LOW (ref 36.0–55.0)
Hemoglobin: 9.6 gm/dl — ABNORMAL LOW (ref 13.0–18.0)
Immature Granulocytes: 0.9 % (ref 0.0–3.0)
Lymphocytes: 20.9 % — ABNORMAL LOW (ref 28–48)
MCH: 30.1 pg (ref 23.0–34.6)
MCHC: 32.8 gm/dl (ref 30.0–36.0)
MCV: 91.8 fL (ref 80.0–98.0)
MPV: 10.9 fL — ABNORMAL HIGH (ref 6.0–10.0)
Monocytes: 8.6 % (ref 1–13)
Neutrophils Segmented: 66.9 % — ABNORMAL HIGH (ref 34–64)
Nucleated RBCs: 0 (ref 0–0)
Platelets: 109 10*3/uL — ABNORMAL LOW (ref 140–450)
RBC: 3.19 M/uL — ABNORMAL LOW (ref 3.80–5.70)
RDW: 57.1 — ABNORMAL HIGH (ref 35.1–43.9)
WBC: 5.7 10*3/uL (ref 4.0–11.0)

## 2022-05-18 LAB — HEMOGLOBIN AND HEMATOCRIT
Hematocrit: 27.1 % — ABNORMAL LOW (ref 36.0–55.0)
Hemoglobin: 8.8 gm/dl — ABNORMAL LOW (ref 13.0–18.0)

## 2022-05-18 MED ORDER — GABAPENTIN 300 MG PO CAPS
300 | Freq: Three times a day (TID) | ORAL | Status: DC
Start: 2022-05-18 — End: 2022-05-18

## 2022-05-18 MED ORDER — GABAPENTIN 300 MG PO CAPS
300 MG | Freq: Three times a day (TID) | ORAL | Status: DC
Start: 2022-05-18 — End: 2022-05-21
  Administered 2022-05-18 – 2022-05-21 (×9): 300 mg via ORAL

## 2022-05-18 MED ORDER — IOPAMIDOL 61 % IV SOLN
61 | Freq: Once | INTRAVENOUS | Status: AC | PRN
Start: 2022-05-18 — End: 2022-05-18
  Administered 2022-05-18: 20:00:00 85 mL via INTRAVENOUS

## 2022-05-18 MED FILL — CREON 6000-19000 UNITS PO CPEP: 6000-19000 units | ORAL | Qty: 2

## 2022-05-18 MED FILL — ISOVUE-300 61 % IV SOLN: 61 % | INTRAVENOUS | Qty: 85

## 2022-05-18 MED FILL — ALLOPURINOL 100 MG PO TABS: 100 MG | ORAL | Qty: 3

## 2022-05-18 MED FILL — TAMSULOSIN HCL 0.4 MG PO CAPS: 0.4 MG | ORAL | Qty: 2

## 2022-05-18 MED FILL — GABAPENTIN 300 MG PO CAPS: 300 MG | ORAL | Qty: 1

## 2022-05-18 MED FILL — CREON 6000-19000 UNITS PO CPEP: 6000-19000 units | ORAL | Qty: 1

## 2022-05-18 MED FILL — PRAVASTATIN SODIUM 10 MG PO TABS: 10 MG | ORAL | Qty: 1

## 2022-05-18 MED FILL — PANTOPRAZOLE SODIUM 40 MG IV SOLR: 40 MG | INTRAVENOUS | Qty: 40

## 2022-05-18 MED FILL — TEMAZEPAM 7.5 MG PO CAPS: 7.5 MG | ORAL | Qty: 2

## 2022-05-18 NOTE — Progress Notes (Signed)
GI Progress Note    Admit Date:  05/16/2022     Assessment:     GI bleed.  Painless hematochezia.  Likely diverticular bleed.  CTA neg for evidence of active bleeding. EGD 05/17/22 normal. Colonoscopy 2022 with diverticulosis.  No recurrent bleeding  Acute anemia secondary to #1.  Hgb 9.6 this AM.  Dysphagia with solids  Pancreatic cancer s/p whipple  AKI  HTN/HLD  CAD s/p CABG, daily ASA  BPH  Chronic neuropathy  Gout    Plan:     Continue PPI  Monitor H/H and transfuse as needed  Monitor for evidence of recurrent active GI bleeding  If recurrent GI bleeding then check stat CTA abdomen and pelvis  Advance to full liquid diet    Subjective:     Denies abdominal pain. Soft brown stool yesterday.  No BM today.    Patient Active Problem List   Diagnosis    UTI (urinary tract infection)    History of syncope    AKI (acute kidney injury) ([place])    Hypertensive retinopathy, bilateral    Hx of CABG    Intraductal papillary mucinous neoplasm    Vitreous degeneration, left eye    Clostridium difficile diarrhea    Syncope    Atherosclerosis of coronary artery    Chest pain    H/O neck surgery    Palpitations    Bacteremia due to Klebsiella pneumoniae    Essential hypertension    Coronary artery disease (CAD) excluded    Pneumonia    Testosterone deficiency    Cervical disc disease    Port-A-Cath in place    AR (allergic rhinitis)    Acute chest pain    Metabolic acidosis    Murmur, cardiac    Sepsis ([place])    Liver cyst    BPH (benign prostatic hyperplasia)    Carotid artery disease (HCC)    Seasonal allergies    Malignant neoplasm of head of pancreas (HCC)    Hypercholesteremia    Puckering of macula, right eye    Tributary (branch) retinal vein occlusion, right eye, stable    Dyslipidemia    Ejection fraction < 50%    Diarrhea    Recurrent nephrolithiasis    Preop cardiovascular exam    Gout    Steatorrhea    Pneumonia due to COVID-19 virus    History of coronary artery disease    Rectal bleeding    Lower GI bleed         Objective:     BP 121/64   Pulse 85   Temp 98.3 F (36.8 C) (Temporal)   Resp 21   Ht 1.676 m (5\' 6" )   Wt 63.5 kg (140 lb)   SpO2 96%   BMI 22.60 kg/m    Temp (24hrs), Avg:98.3 F (36.8 C), Min:97.4 F (36.3 C), Max:99 F (37.2 C)       Physical Exam:  General: Lying comfortably, in no acute distress  Lungs: Clear anteriorly  CV: +s1s2  Abd: soft, NT, +BS      Intake/Output Summary (Last 24 hours) at 05/18/2022 0919  Last data filed at 05/17/2022 2155  Gross per 24 hour   Intake 1163.33 ml   Output 703 ml   Net 460.33 ml       Current Facility-Administered Medications   Medication Dose Route Frequency Provider Last Rate Last Admin    0.9 % sodium chloride infusion   IntraVENous PRN Sherri Rad, PA  acetaminophen (TYLENOL) tablet 650 mg  650 mg Oral Q6H PRN Matriano, Crista Curb, MD        dextrose 5 % and 0.9 % sodium chloride infusion   IntraVENous Continuous Simoes, Ruchita, MD 75 mL/hr at 05/18/22 0644 New Bag at 05/18/22 0644    0.9 % sodium chloride infusion   IntraVENous PRN Levasseur, Isabel Caprice, MD        0.9 % sodium chloride infusion   IntraVENous PRN Rondel Baton, Isabel Caprice, MD        0.9 % sodium chloride infusion   IntraVENous PRN Rondel Baton, Isabel Caprice, MD        pantoprazole (PROTONIX) 40 mg in sodium chloride (PF) 0.9 % 10 mL injection  40 mg IntraVENous Q12H Matriano, Crista Curb, MD   40 mg at 05/17/22 2320    naloxone [facility]) injection 0.4 mg  0.4 mg IntraVENous PRN Illene Regulus, MD        ondansetron [facility]) injection 4 mg  4 mg IntraVENous Q6H PRN Illene Regulus, MD   4 mg at 05/17/22 1020    melatonin tablet 3 mg  3 mg Oral Nightly PRN Illene Regulus, MD        allopurinol (ZYLOPRIM) tablet 300 mg  300 mg Oral Daily Illene Regulus, MD   300 mg at 05/17/22 [id number]    gabapentin (NEURONTIN) capsule 300 mg  300 mg Oral TID Illene Regulus, MD   300 mg at 05/17/22 2038    lipase-protease-amylase (CREON) delayed release capsule 48,000 Units  48,000  Units Oral TID WC Illene Regulus, MD   48,000 Units at 05/17/22 1748    pravastatin (PRAVACHOL) tablet 10 mg  10 mg Oral Daily Illene Regulus, MD        tamsulosin [facility]) capsule 0.8 mg  0.8 mg Oral Daily Illene Regulus, MD   0.8 mg at 05/17/22 0835    temazepam (RESTORIL) capsule 15 mg  15 mg Oral Nightly PRN Illene Regulus, MD   15 mg at 05/17/22 2320    methocarbamol (ROBAXIN) tablet 500 mg  500 mg Oral Daily PRN Illene Regulus, MD           CBC w/Diff   Lab Results   Component Value Date/Time    WBC 5.7 05/18/2022 04:30 AM    WBC 9.5 05/16/2022 06:30 PM    WBC 5.9 05/12/2022 04:15 AM    RBC 3.19 (L) 05/18/2022 04:30 AM    RBC 2.54 (L) 05/16/2022 06:30 PM    RBC 2.59 (L) 05/12/2022 04:15 AM    HGB 9.6 (L) 05/18/2022 04:30 AM    HGB 9.4 (L) 05/17/2022 06:04 PM    HGB 10.1 (L) 05/17/2022 03:33 PM    HCT 29.3 (L) 05/18/2022 04:30 AM    HCT 27.5 (L) 05/17/2022 06:04 PM    HCT 29.8 (L) 05/17/2022 03:33 PM    MCV 91.8 05/18/2022 04:30 AM    MCV 100.0 (H) 05/16/2022 06:30 PM    MCV 98.5 (H) 05/12/2022 04:15 AM    MCH 30.1 05/18/2022 04:30 AM    MCH 32.3 05/16/2022 06:30 PM    MCH 31.7 05/12/2022 04:15 AM    MCHC 32.8 05/18/2022 04:30 AM    MCHC 32.3 05/16/2022 06:30 PM    MCHC 32.2 05/12/2022 04:15 AM    RDW 57.1 (H) 05/18/2022 04:30 AM    RDW 56.2 (H) 05/16/2022 06:30  PM    RDW 50.8 (H) 05/12/2022 04:15 AM    PLT 109 (L) 05/18/2022 04:30 AM    PLT 183 05/16/2022 06:30 PM    PLT 114 (L) 05/12/2022 04:15 AM    MPV 10.9 (H) 05/18/2022 04:30 AM    MPV 11.1 (H) 05/16/2022 06:30 PM    MPV 11.8 (H) 05/12/2022 04:15 AM     Lab Results   Component Value Date/Time    MONOS 8.6 05/18/2022 04:30 AM    MONOS 8.8 05/16/2022 06:30 PM    MONOS 5.6 05/12/2022 04:15 AM        Hepatic Function   Lab Results   Component Value Date/Time    ALT 35 05/17/2022 06:41 AM    ALT 48 05/16/2022 06:30 PM    ALT 62 (H) 05/10/2022 02:40 AM    TP 4.8 (L) 05/17/2022 06:41 AM    TP 6.0 05/16/2022 06:30 PM    TP 4.8  (L) 05/10/2022 02:40 AM        Pancreatic Enzymes   No results found for: "AML"     Basic Metabolic Profile   Lab Results   Component Value Date/Time    NA 139 05/18/2022 04:30 AM    NA 137 05/17/2022 12:26 PM    NA 138 05/17/2022 06:41 AM    K 4.7 05/18/2022 04:30 AM    K 4.7 05/17/2022 12:26 PM    K 4.9 05/17/2022 06:41 AM    CL 112 (H) 05/18/2022 04:30 AM    CL 107 05/17/2022 12:26 PM    CL 109 (H) 05/17/2022 06:41 AM    CO2 24 05/18/2022 04:30 AM    CO2 22 05/17/2022 06:41 AM    CO2 25 05/16/2022 06:30 PM    CO2 23.0 (L) 01/12/2019 05:27 AM    CO2 23.0 (L) 01/12/2019 04:17 AM    BUN 21 05/18/2022 04:30 AM    BUN 30 (H) 05/17/2022 12:26 PM    BUN 35 (H) 05/17/2022 06:41 AM    MG 1.8 05/12/2022 04:15 AM    MG 1.9 05/11/2022 10:49 AM    MG 2.1 09/25/2020 03:00 PM    PHOS 3.2 05/12/2022 04:15 AM    PHOS 3.2 05/11/2022 10:49 AM    PHOS 3.0 07/17/2018 11:41 AM        Coagulation   Lab Results   Component Value Date/Time    INR 1.1 05/16/2022 06:30 PM    INR 1.1 05/10/2022 02:40 AM    INR 1.1 05/09/2022 04:40 PM          Microbiology Results  Invalid input(s): "CULT"    Radiology Results  CTA ABDOMEN PELVIS W WO CONTRAST    Result Date: 05/16/2022  DICOM format image data is available to non-affiliated external healthcare facilities or entities on a secure, media free, reciprocally searchable basis with patient authorization for 12 months following the date of the study. Clinical History:rectal bleeding.  Examination: CTA scan of the abdomen and pelvis without and with contrast 05/16/2022 10:22 PM EDT.  5 mm noncontrast spiral scanning was performed from the lung bases to the symphysis pubis. 3 mm spiral scanning is performed with intravenous contrast in the arterial phase from the lung bases to the symphysis pubis. 3 D MIP, coronal and sagittal reconstruction images has been obtained. Correlation: 01/25/2022 FINDINGS: Lung bases are clear. Atherosclerotic calcification of the coronary arteries, abdominal aorta and  mesenteric arteries. Degenerative changes of the spine. Penile implant. Pneumobilia likely related to sphincterotomy. Gallbladder is absent.  Multiple small hepatic and renal hypodensities are too small to characterize. There are too numerous to characterize renal hypodensities. Cyst with wall calcification lower pole left kidney. Spleen, pancreatic tail is unremarkable, status post Whipple's. Adrenal glands and bladder are unremarkable. Prostate is enlarged. There are colonic diverticuli. Terminal ileum, appendix, stomach and small bowel loops are within normal limits. Dense atherosclerotic calcification of the abdominal aorta, mesenteric, renal and iliac arteries. No lymph node enlargement or free fluid. Nonobstructing 4 mm left nephrolithiasis.     IMPRESSION: 1. No active GI bleed. 2. Diverticulosis. 3. Status post Whipple's procedure. 4. Multiple hepatic and renal hypodensities some small and too numerous to characterize. 5. 4 mm nonobstructing left nephrolithiasis. Electronically signed by: Kathlene Cote, MD 05/16/2022 10:34 PM EDT          Workstation ID: [place]. Lucien Mons, M.D.  [place] Office [phone]

## 2022-05-18 NOTE — Plan of Care (Signed)
Problem: Safety - Adult  Goal: Free from fall injury  Outcome: Progressing     Problem: Skin/Tissue Integrity  Goal: Absence of new skin breakdown  Description: 1.  Monitor for areas of redness and/or skin breakdown  2.  Assess vascular access sites hourly  3.  Every 4-6 hours minimum:  Change oxygen saturation probe site  4.  Every 4-6 hours:  If on nasal continuous positive airway pressure, respiratory therapy assess nares and determine need for appliance change or resting period.  Outcome: Progressing     Problem: Pain  Goal: Verbalizes/displays adequate comfort level or baseline comfort level  Outcome: Progressing

## 2022-05-18 NOTE — Progress Notes (Addendum)
INTERNAL MEDICINE PROGRESS NOTE    Date of note:      May 18, 2022    Patient:               Tommy Brown, 77 y.o., male  Admit Date:        05/16/2022  Length of Stay:  2 day(s)    Problem List:   Patient Active Problem List   Diagnosis    UTI (urinary tract infection)    History of syncope    AKI (acute kidney injury) ([place])    Hypertensive retinopathy, bilateral    Hx of CABG    Intraductal papillary mucinous neoplasm    Vitreous degeneration, left eye    Clostridium difficile diarrhea    Syncope    Atherosclerosis of coronary artery    Chest pain    H/O neck surgery    Palpitations    Bacteremia due to Klebsiella pneumoniae    Essential hypertension    Coronary artery disease (CAD) excluded    Pneumonia    Testosterone deficiency    Cervical disc disease    Port-A-Cath in place    AR (allergic rhinitis)    Acute chest pain    Metabolic acidosis    Murmur, cardiac    Sepsis ([place])    Liver cyst    BPH (benign prostatic hyperplasia)    Carotid artery disease (HCC)    Seasonal allergies    Malignant neoplasm of head of pancreas (HCC)    Hypercholesteremia    Puckering of macula, right eye    Tributary (branch) retinal vein occlusion, right eye, stable    Dyslipidemia    Ejection fraction < 50%    Diarrhea    Recurrent nephrolithiasis    Preop cardiovascular exam    Gout    Steatorrhea    Pneumonia due to COVID-19 virus    History of coronary artery disease    Rectal bleeding    Lower GI bleed       Subjective + interval history     Had brown stool after colonoscopy yesterday. No BM today. Has mild back pain from laying in bed     Assessment :     Acute painless hematochezia - suspect diverticular bleeding  Acute on chronic anemia due to GI bleeding  Recent hospitalization from 05/09/22 to 05/12/22 for lower GI bleeding  CKD III A  CAD s/p CABG  Hypertension  dyslipidemia  Pancreatic cancer s/p Whipple's procedure  BPH  Moderate aortic stenosis       Plan :     Continue close monitoring in step down unit      Patient was recently discharged on 05/12/22 - presented to the ER on 3/21 for rectal bleeding   CTA  abdomen 05/16/22  - no active GI bleed.  + diverticulosis  Hemoglobin went down to 6.2 - s/p 3 units PRBC transfusion -> Hemoglobin stable at 9.6  Monitor H/H  q12 h  and transfuse PRBC if hemoglobin drops below 7    S/p EGD 05/17/22 - normal  Flex sigmoidoscopy 05/17/22 - Large amount of old blood and unable to adequately visualize the colonic mucosa.  Scope not advanced beyond the sigmoid colon at 20cm.  No active bleeding seen    GI input appreciated.   Recommend stat CTA abdomen pelvis if recurrent bleeding  Trial of full liquid diet  Continue PPI    CBC BMP reviewed. Creatinine stable 1.2    Discussed with RN  and updated patient's wife at bedside      Objective:     Visit Vitals  BP 121/64   Pulse 85   Temp 98.3 F (36.8 C) (Temporal)   Resp 21   Ht 1.676 m (5\' 6" )   Wt 63.5 kg (140 lb)   SpO2 96%   BMI 22.60 kg/m                Intake/Output Summary (Last 24 hours) at 05/18/2022 [id number]  Last data filed at 05/17/2022 2155  Gross per 24 hour   Intake 1163.33 ml   Output 703 ml   Net 460.33 ml       Physical Exam:     GEN - AAOx3, NAD  HEENT - mucous membranes moist  Neck - supple, no JVD  Cardiac - RRR, S1, S2, 2/6 systolic murmur  Chest/Lungs - clear to auscultation without wheezes or rhonchi  Abdomen - soft, nontender,   Extremities - no clubbing/ cyanosis/ edema  Neuro - follows commands, ambulating    Current medications:       Current Facility-Administered Medications:     0.9 % sodium chloride infusion, , IntraVENous, PRN, [place] Evert, Erin N, PA    acetaminophen (TYLENOL) tablet 650 mg, 650 mg, Oral, Q6H PRN, Matriano, Patriciaann Clan H, MD    dextrose 5 % and 0.9 % sodium chloride infusion, , IntraVENous, Continuous, Eleftheria Taborn, MD, Last Rate: 75 mL/hr at 05/18/22 0644, New Bag at 05/18/22 0644    0.9 % sodium chloride infusion, , IntraVENous, PRN, Levasseur, Isabel Caprice, MD    0.9 % sodium chloride infusion, ,  IntraVENous, PRN, Levasseur, Isabel Caprice, MD    0.9 % sodium chloride infusion, , IntraVENous, PRN, Levasseur, Isabel Caprice, MD    pantoprazole (PROTONIX) 40 mg in sodium chloride (PF) 0.9 % 10 mL injection, 40 mg, IntraVENous, Q12H, Matriano, Crista Curb, MD, 40 mg at 05/17/22 2320    naloxone [facility]) injection 0.4 mg, 0.4 mg, IntraVENous, PRN, Matriano, Crista Curb, MD    ondansetron [facility]) injection 4 mg, 4 mg, IntraVENous, Q6H PRN, Matriano, Crista Curb, MD, 4 mg at 05/17/22 1020    melatonin tablet 3 mg, 3 mg, Oral, Nightly PRN, Matriano, Crista Curb, MD    allopurinol (ZYLOPRIM) tablet 300 mg, 300 mg, Oral, Daily, Matriano, Crista Curb, MD, 300 mg at 05/17/22 [id number]    gabapentin (NEURONTIN) capsule 300 mg, 300 mg, Oral, TID, Matriano, Crista Curb, MD, 300 mg at 05/17/22 2038    lipase-protease-amylase (CREON) delayed release capsule 48,000 Units, 48,000 Units, Oral, TID WC, Matriano, Crista Curb, MD, 48,000 Units at 05/17/22 1748    pravastatin (PRAVACHOL) tablet 10 mg, 10 mg, Oral, Daily, Matriano, Crista Curb, MD    tamsulosin [facility]) capsule 0.8 mg, 0.8 mg, Oral, Daily, Matriano, Patriciaann Clan H, MD, 0.8 mg at 05/17/22 0835    temazepam (RESTORIL) capsule 15 mg, 15 mg, Oral, Nightly PRN, Matriano, Crista Curb, MD, 15 mg at 05/17/22 2320    methocarbamol (ROBAXIN) tablet 500 mg, 500 mg, Oral, Daily PRN, Matriano, Crista Curb, MD    Labs:     Recent Results (from the past 24 hour(s))   POC CHEM 8    Collection Time: 05/17/22 12:26 PM   Result Value Ref Range    Sodium 137 136 - 145 mEq/L    Potassium 4.7 3.5 - 4.9 mEq/L    Chloride 107 98 - 107 mEq/L  Total CO2 23 21 - 32 mmol/L    Glucose 127 (H) 74 - 106 mg/dL    BUN 30 (H) 7 - 25 mg/dl    Creatinine 1.5 (H) 0.6 - 1.3 mg/dl    Hematocrit 23 (L) 40 - 54 %    Hemoglobin 7.8 (L) 12.4 - 17.2 gm/dl    Calcium, Ionized 4.80 4.40 - 5.40 mg/dL   PREPARE RBC (CROSSMATCH)    Collection Time: 05/17/22 12:26 PM   Result Value Ref Range    Blood unit identifier Red Blood  Cells      Unit Number [id number]      Crossmatch Result Compatible      Dispense Status Blood Bank Transfused      Product Code Blood Bank E0336V00      Blood Type 0600      Unit Issue Date/Time [id number]      Specimen Expiration [id number]     PREPARE RBC (CROSSMATCH), 2 Units    Collection Time: 05/17/22 12:30 PM   Result Value Ref Range    Blood unit identifier Red Blood Cells      Unit Number [id number]      Crossmatch Result Compatible      Dispense Status Blood Bank Transfused      Product Code Blood Bank E0336V00      Blood Type 0600      Unit Issue Date/Time [id number]      Specimen Expiration [id number]     PREPARE RBC (CROSSMATCH), 2 Units    Collection Time: 05/17/22 12:45 PM   Result Value Ref Range    Blood unit identifier Red Blood Cells      Unit Number [id number]      Crossmatch Result Compatible      Dispense Status Blood Bank Ready      Product Code Blood Bank [id number]      Blood Type 0600      Specimen Expiration [id number]     PREPARE RBC (CROSSMATCH)    Collection Time: 05/17/22  1:30 PM   Result Value Ref Range    Blood unit identifier Red Blood Cells      Unit Number [id number]      Crossmatch Result Compatible      Dispense Status Blood Bank Ready      Product Code Blood Bank E0336V00      Blood Type 0600      Specimen Expiration [id number]     Hemoglobin and Hematocrit    Collection Time: 05/17/22  3:33 PM   Result Value Ref Range    Hemoglobin 10.1 (L) 13.0 - 18.0 gm/dl    Hematocrit 29.8 (L) 36.0 - 55.0 %   Hemoglobin and Hematocrit    Collection Time: 05/17/22  6:04 PM   Result Value Ref Range    Hemoglobin 9.4 (L) 13.0 - 18.0 gm/dl    Hematocrit 27.5 (L) 36.0 - 55.0 %   CBC with Auto Differential    Collection Time: 05/18/22  4:30 AM   Result Value Ref Range    WBC 5.7 4.0 - 11.0 1000/mm3    RBC 3.19 (L) 3.80 - 5.70 M/uL    Hemoglobin 9.6 (L) 13.0 - 18.0 gm/dl    Hematocrit 29.3 (L) 36.0 - 55.0 %    MCV 91.8 80.0 - 98.0 fL    MCH 30.1 23.0 - 34.6 pg    MCHC 32.8  30.0 - 36.0 gm/dl    Platelets 109 (L) 140 - 450 1000/mm3  MPV 10.9 (H) 6.0 - 10.0 fL    RDW 57.1 (H) 35.1 - 43.9      Nucleated RBCs 0 0 - 0      Immature Granulocytes 0.9 0.0 - 3.0 %    Neutrophils Segmented 66.9 (H) 34 - 64 %    Lymphocytes 20.9 (L) 28 - 48 %    Monocytes 8.6 1 - 13 %    Eosinophils 2.5 0 - 5 %    Basophils 0.2 0 - 3 %   Basic Metabolic Panel    Collection Time: 05/18/22  4:30 AM   Result Value Ref Range    Potassium 4.7 3.5 - 5.1 mEq/L    Chloride 112 (H) 98 - 107 mEq/L    Sodium 139 136 - 145 mEq/L    CO2 24 20 - 31 mEq/L    Glucose 102 74 - 106 mg/dl    BUN 21 9 - 23 mg/dl    Creatinine 1.24 0.70 - 1.30 mg/dl    GFR African American >60      GFR Non-African American 60      Calcium 8.2 (L) 8.7 - 10.4 mg/dl    Anion Gap 3 (L) 5 - 15 mmol/L       RAD Results:  CTA ABDOMEN PELVIS W WO CONTRAST    Result Date: 05/16/2022  DICOM format image data is available to non-affiliated external healthcare facilities or entities on a secure, media free, reciprocally searchable basis with patient authorization for 12 months following the date of the study. Clinical History:rectal bleeding.  Examination: CTA scan of the abdomen and pelvis without and with contrast 05/16/2022 10:22 PM EDT.  5 mm noncontrast spiral scanning was performed from the lung bases to the symphysis pubis. 3 mm spiral scanning is performed with intravenous contrast in the arterial phase from the lung bases to the symphysis pubis. 3 D MIP, coronal and sagittal reconstruction images has been obtained. Correlation: 01/25/2022 FINDINGS: Lung bases are clear. Atherosclerotic calcification of the coronary arteries, abdominal aorta and mesenteric arteries. Degenerative changes of the spine. Penile implant. Pneumobilia likely related to sphincterotomy. Gallbladder is absent. Multiple small hepatic and renal hypodensities are too small to characterize. There are too numerous to characterize renal hypodensities. Cyst with wall calcification lower  pole left kidney. Spleen, pancreatic tail is unremarkable, status post Whipple's. Adrenal glands and bladder are unremarkable. Prostate is enlarged. There are colonic diverticuli. Terminal ileum, appendix, stomach and small bowel loops are within normal limits. Dense atherosclerotic calcification of the abdominal aorta, mesenteric, renal and iliac arteries. No lymph node enlargement or free fluid. Nonobstructing 4 mm left nephrolithiasis.     IMPRESSION: 1. No active GI bleed. 2. Diverticulosis. 3. Status post Whipple's procedure. 4. Multiple hepatic and renal hypodensities some small and too numerous to characterize. 5. 4 mm nonobstructing left nephrolithiasis. Electronically signed by: Kathlene Cote, MD 05/16/2022 10:34 PM EDT          Workstation ID: [email or web address]         Total clinical care time was 37  minutes of which more than 50% was spent in coordination of care and counseling (time spent with patient/family face to face, physical exam, reviewing laboratory and imaging investigations, speaking with physicians and nursing staff involved in this patient's care).     Dragon medical dictation system was used for part of this note. Unintentional voice recognition errors may occur    Perfecto Kingdom,  MD  [facility]  May 18, 2022  Time: 9:29 AM

## 2022-05-18 NOTE — Progress Notes (Signed)
NUTRITION RECOMMENDATIONS:   Advance diet as tolerated.   Order Ensure Enlive at pt's request.  Wts 3x/week to trend.   Monitor electrolytes, replete prn.   Discharge recommendations: pending clinical course    NUTRITION INITIAL EVALUATION    NUTRITION ASSESSMENT:       Reason for Assessment: mst    Admitting Diagnosis: Acute blood loss anemia [D62]  Rectal bleeding [K62.5]     PMH:   Past Medical History:   Diagnosis Date    Arthritis     Benign prostate hyperplasia     Bladder infection     Chronic kidney disease     Coronary arteriosclerosis     Coronary atherosclerosis of artery bypass graft     Decreased testosterone level     Diverticular disease     Essential hypertension     Gout     History of acute renal failure     History of anemia     History of malignant neoplasm of pancreas     Hx of CABG     Kidney stone     Kidney stones     Neuropathy     NECK, SHOULDERS    Pancreatic cancer ([place])     Sepsis ([place])        Pertinent Medications: creon, protonix    Pertinent Labs: labs reviewed    Anthropometrics:  Height:   Ht Readings from Last 3 Encounters:   05/17/22 1.676 m (5\' 6" )   05/09/22 1.676 m (5\' 6" )   02/14/22 1.676 m (5\' 6" )      Weight:   Wt Readings from Last 10 Encounters:   05/18/22 58.6 kg (129 lb 3 oz)   05/09/22 63.5 kg (140 lb)   02/14/22 61.7 kg (136 lb)   11/15/21 59.2 kg (130 lb 9.6 oz)   08/10/21 61.2 kg (135 lb)   05/30/21 61.2 kg (135 lb)   07/10/20 60.3 kg (132 lb 15 oz)   04/03/20 59.4 kg (131 lb)   11/17/19 62.1 kg (137 lb)   09/08/19 61.7 kg (136 lb)       BMI: Body mass index is 20.85 kg/m.          IBW: Ideal body weight: 63.8 kg (140 lb 10.5 oz)   UBW: Pt reports 126-128 lb UBW lately, states he has not been 140 lb for > 3 years now    Wt Change: -5% in 3 months per EMR (using 02/14/22 wt)  []  significant   []  not significant []  stable   []  intended  []  not intended      GI Symptoms/Issues:  diverticular bleeding   Recent hospitalization from 05/09/22 to 05/12/22 for lower GI  bleeding   Hx Pancreatic cancer s/p Whipple's procedure   Last BM (including prior to admit): 05/17/22  GI Symptoms: Other (Comment) (bloody stool)    Chewing/Swallowing Issues: none per pt     Skin Integrity: [x]  Intact per flow sheets    Fluid Accumulation/Edema: none noted    Physical Assessment: No muscle or fat wasting noted.     Diet and Intake History - Prior to Admission:   Current Diet Order: ADULT DIET; Full Liquid    Food Allergies: NKFA    Diet/Intake History: Pt reports good appetite/PO intake PTA. Pt and his wife state he "eats like a horse"    Current Appetite/PO Intake: Pt expresses eagerness for diet advancement and real food, states appetite is normal. Pt declines Ensure at this time.  Assessment of Current MNT: Diet is appropriate for the time being but is inadequate to meet long-term needs. Advance diet when medically able. Full Liquid diet provides approximately 1140 kcal (78%), 42 g protein (71%).    Estimated Daily Nutrition Needs: 58.6 kg current wt  1465 - 1758 kcals (25-30 kcal/kg)   59 - 70 g protein (1.0-1.2 g/kg) CKD, underweight/wt loss  1465 mL fluid (25 ml/kg or per MD)     NUTRITION DIAGNOSIS:     Underweight related to recent unintended wt loss (not significant) of unknown etiology as evidenced by BMI <23 for older adults.    NUTRITION INTERVENTION / RECOMMENDATIONS:     Advance diet as tolerated.   Order Ensure Enlive at pt's request.  Wts 3x/week to trend.   Monitor electrolytes, replete prn.   Discharge recommendations: pending clinical course    NUTRITION MONITORING AND EVALUATION:     Nutrition Level of Care:   []  Low       []  Moderate    [x]  High    Monitoring and Evaluation: PO intake, wt trends, nutrition related labs, skin integrity, and if applicable, supplement acceptance and/or nutrition support tolerance.     Nutrition Goals:  Pt to meet/tolerate >75% of estimated needs, weight maintenance throughout LOS, BG control <180, Improvement and/or maintenance of skin  integrity.    Code Status: Full Code     Zenovia Jordan, [place]   05/18/22   Office: Ext [phone]  On-Call: [phone]

## 2022-05-19 LAB — CBC WITH AUTO DIFFERENTIAL
Basophils: 0.2 % (ref 0–3)
Eosinophils: 3.3 % (ref 0–5)
Hematocrit: 28.1 % — ABNORMAL LOW (ref 36.0–55.0)
Hemoglobin: 9.3 gm/dl — ABNORMAL LOW (ref 13.0–18.0)
Immature Granulocytes: 0.7 % (ref 0.0–3.0)
Lymphocytes: 24.6 % — ABNORMAL LOW (ref 28–48)
MCH: 31.7 pg (ref 23.0–34.6)
MCHC: 33.1 gm/dl (ref 30.0–36.0)
MCV: 95.9 fL (ref 80.0–98.0)
MPV: 11.1 fL — ABNORMAL HIGH (ref 6.0–10.0)
Monocytes: 8.7 % (ref 1–13)
Neutrophils Segmented: 62.5 % (ref 34–64)
Nucleated RBCs: 0 (ref 0–0)
Platelets: 117 10*3/uL — ABNORMAL LOW (ref 140–450)
RBC: 2.93 M/uL — ABNORMAL LOW (ref 3.80–5.70)
RDW: 58.5 — ABNORMAL HIGH (ref 35.1–43.9)
WBC: 4.6 10*3/uL (ref 4.0–11.0)

## 2022-05-19 LAB — BASIC METABOLIC PANEL
Anion Gap: 6 mmol/L (ref 5–15)
BUN: 13 mg/dl (ref 9–23)
CO2: 24 mEq/L (ref 20–31)
Calcium: 8.4 mg/dl — ABNORMAL LOW (ref 8.7–10.4)
Chloride: 110 mEq/L — ABNORMAL HIGH (ref 98–107)
Creatinine: 1.2 mg/dl (ref 0.70–1.30)
GFR African American: >60
GFR Non-African American: >60
Glucose: 99 mg/dl (ref 74–106)
Potassium: 4.4 mEq/L (ref 3.5–5.1)
Sodium: 140 mEq/L (ref 136–145)

## 2022-05-19 MED ORDER — VANCOMYCIN INTERMITTENT DOSING (PLACEHOLDER)
INTRAVENOUS | Status: DC
Start: 2022-05-19 — End: 2022-05-22

## 2022-05-19 MED ORDER — VANCOMYCIN 1250 MG IN NS 250 ML (PREMIX) IVPB
Freq: Once | Status: AC
Start: 2022-05-19 — End: 2022-05-19
  Administered 2022-05-19: 14:00:00 1250 mg via INTRAVENOUS

## 2022-05-19 MED ORDER — VANCOMYCIN (VANCOCIN) 1000 MG IN SODIUM CHLORIDE 0.9% 250 ML IVPB
Status: DC
Start: 2022-05-19 — End: 2022-05-22
  Administered 2022-05-20 – 2022-05-22 (×3): 1000 mg via INTRAVENOUS

## 2022-05-19 MED ORDER — VANCOMYCIN INTERMITTENT DOSING (PLACEHOLDER)
Freq: Once | INTRAVENOUS | Status: DC
Start: 2022-05-19 — End: 2022-05-22

## 2022-05-19 MED FILL — GABAPENTIN 300 MG PO CAPS: 300 MG | ORAL | Qty: 1

## 2022-05-19 MED FILL — PRAVASTATIN SODIUM 10 MG PO TABS: 10 MG | ORAL | Qty: 1

## 2022-05-19 MED FILL — ALLOPURINOL 100 MG PO TABS: 100 MG | ORAL | Qty: 3

## 2022-05-19 MED FILL — CREON 6000-19000 UNITS PO CPEP: 6000-19000 units | ORAL | Qty: 2

## 2022-05-19 MED FILL — ACETAMINOPHEN 325 MG PO TABS: 325 MG | ORAL | Qty: 2

## 2022-05-19 MED FILL — TAMSULOSIN HCL 0.4 MG PO CAPS: 0.4 MG | ORAL | Qty: 2

## 2022-05-19 MED FILL — PANTOPRAZOLE SODIUM 40 MG IV SOLR: 40 MG | INTRAVENOUS | Qty: 40

## 2022-05-19 MED FILL — TEMAZEPAM 7.5 MG PO CAPS: 7.5 MG | ORAL | Qty: 2

## 2022-05-19 MED FILL — VANCOMYCIN 1250 MG IN NS 250 ML (PREMIX) IVPB: Qty: 250

## 2022-05-19 NOTE — Progress Notes (Signed)
[facility] Dosing Services:  Vancomycin Initial Note    Tommy Brown is a 77 y.o. year old male on vancomycin due to concern for bloodstream infection. Pharmacy has been consulted to assist with vancomycin dosing. Blood cultures from 3/21 positive for GPC in pairs, chains + clusters.    Ordering physician: Dr. Ninetta Lights    Date consult initiated: 05/19/22    Vancomycin indication: Bloodstream infection  Trough goal: 13-20 mcg/mL  AUC/MIC goal: 500-600 mcg*hr/mL      Ht Readings from Last 1 Encounters:   05/17/22 1.676 m (5\' 6" )        Wt Readings from Last 1 Encounters:   05/18/22 58.6 kg (129 lb 3 oz)      Ideal body weight: 63.8 kg  Adjusted body weight: 58.6 kg    Dosing weight: 58.6 kg    Loading dose: vancomycin 1250 mg (~21.3 mg/kg) IV x1 on 05/19/22 at 1023    Estimated Creatinine Clearance: 42 mL/min (based on SCr of 1.2 mg/dL).    Recent Labs     05/17/22  1226 05/18/22  0430 05/19/22  0220   CREATININE 1.5* 1.24 1.20   BUN 30* 21 13     Recent Labs     05/16/22  1830 05/18/22  0430 05/19/22  0220   WBC 9.5 5.7 4.6         Plan: Will order a maintenance dose of vancomycin 1000 mg IV q24h to begin on 05/20/22 at 1000. This dosing is expected to yield an estimated trough of ~16 mcg/mL (per [employer]  vancomycin calculator).     Vancomycin trough level ordered to be collected on 05/22/22 at 0900.     Pharmacy to follow daily and adjust dosing as indicated.    Thank you for this consult.   Doren Custard, PharmD  [phone]

## 2022-05-19 NOTE — Progress Notes (Signed)
INFECTIOUS DISEASE PRELIM CONSULT NOTE       Requested by: Dr. Ninetta Lights    Reason for consult: bacteremia    Date of admission: 05/16/2022    Date of consult: May 19, 2022      ABX:     Current abx Prior abx    Vanco 3/24-0      ASSESSMENT:      BSI-->Strep Viridans  -3/21 admission BC x 2 1/2 strep viridans, 1/2 GPC pairs, chains, clusters  -Repeat BC x 2 IP  -Afebrile with no elevation in WBC  -Monitor blood cultures  -Will continue Vanco and optimize based on culture results   GI bleed  -Recurrent rectal bleeding  -H&H nadir 6.2 and 19.1  -Status post transfusions  -3/22 EGD normal  -3/22 flex sig with old blood in rectal vault poor visualization   Recent hospitalization (3/14-3/17/2024) for GI bleed   Pancreatic cancer status post Whipple 2006  -Status post chemo/radiation   CAD-->CABG   History of nephrolithiasis  -June 2020 cystoscopy/laser lithotripsy  -01/25/2022 CTAP tiny nonobstructing bilateral renal calculi   History of COVID November 2020  -Treated with remdesivir, convalescent plasma, Decadron   History of strep pneumonia November 2020   Allergies: Amoxicillin (diarrhea)         RECOMMENDATIONS:     -Patient readmitted for GI bleed status post EGD and flex sig  -3/21 Admission blood cultures positive with 1/2 strep viridans and 1/2 GPC pairs and chains  -Monitor blood cultures  -Repeat cultures drawn earlier today  -Continue Vanco and will optimize based on culture data    -Anemia mgt per others    -AKI resolved  -Avoid nephrotoxic medication    FULL CONSULT TO FOLLOW                 MICROBIOLOGY:   PREVIOUS CULTURE AND ID DATA  2020  11/15 urine culture Klebsiella pneumonia, E. Coli   COVID 19+  CURRENT CULTURE AND ID DATA  3/21 BC x 2-->1/2 strep viridans 1/2 GPC pairs chains and clusters  3/24 BC x 2  LINES AND CATHETERS:       HPI:   [name] is a 77 year old gentleman with history significant for HTN, CAD status post CABG, pancreatic cancer diagnosed 2006 status post Whipple,  radiation, chemo, nephrolithiasis with previous lithotripsy and stent placement.    Patient was recently hospitalized [facility] 3/14-3/17/2024 having presented with blood in stools associated with fatigue and lightheadedness.  He was found to have maroon-colored stools.  No anticoagulation with the exception of daily aspirin which was held on admission but eventually restarted prior to discharge..  GI was consulted and recommended CTA if recurrent large bleed and suspected diverticular bleed admitting hemoglobin was 10.6 but during hospitalization remained stable with nadir of 8.2.    Presented to [facility] 3/21 with rectal bleeding.  Initial H&H was stable 8 and 25 but dropped to 6 and 19 requiring transfusions.  CTA and of abdomen and pelvis negative for active GI bleed evidence of diverticulosis with multiple hepatic and renal hypodensities small and too numerous to characterize as well as nonobstructing left nephrolithiasis.  EGD was done on 3/22 which was normal.  Revealed old blood but could not evaluate mucosa.  Serial H&H's have been followed and most recent is 9.3 and 28.1.      Blood cultures were ordered on admission with 1 of 2+ strep viridans and 1/2 GS GPC pairs chains and clusters.  Patient has been afebrile,  WBC C has remained normal and is 4.6 today with 63% segs and 25% lymphocytes.    ID has been consulted for evaluation and management of strep viridans bacteremia.          Past medical history:     Past Medical History:   Diagnosis Date    Arthritis     Benign prostate hyperplasia     Bladder infection     Chronic kidney disease     Coronary arteriosclerosis     Coronary atherosclerosis of artery bypass graft     Decreased testosterone level     Diverticular disease     Essential hypertension     Gout     History of acute renal failure     History of anemia     History of malignant neoplasm of pancreas     Hx of CABG     Kidney stone     Kidney stones     Neuropathy     NECK, SHOULDERS    Pancreatic  cancer ([facility])     Sepsis ([place])        Past Surgical History:   Procedure Laterality Date    CABG, ARTERY-VEIN, FOUR  1995    CARDIAC CATHETERIZATION  2006    CERVICAL FUSION      COLONOSCOPY N/A 07/10/2020    DIAGNOSTIC COLONOSCOPY polypectomy w/hot snare, cold snare , Clip x 1  performed by Dante Gang, MD at [facility]      OTHER SURGICAL HISTORY  01/01/2011    whipple procedure done for pancreatic cancer    UROLOGICAL SURGERY  2012    Percutaneous extraction of a kidney stone w/ fragmentation procedure         Social History:     Social History     Socioeconomic History    Marital status: Married     Spouse name: Not on file    Number of children: Not on file    Years of education: Not on file    Highest education level: Not on file   Occupational History    Not on file   Tobacco Use    Smoking status: Never    Smokeless tobacco: Never   Vaping Use    Vaping Use: Never used   Substance and Sexual Activity    Alcohol use: Not Currently    Drug use: Not Currently    Sexual activity: Defer   Other Topics Concern    Not on file   Social History Narrative    ** Merged History Encounter **          Social Determinants of Health     Financial Resource Strain: Not on file   Food Insecurity: No Food Insecurity (05/17/2022)    Hunger Vital Sign     Worried About Running Out of Food in the Last Year: Never true     Ran Out of Food in the Last Year: Never true   Transportation Needs: No Transportation Needs (05/17/2022)    PRAPARE - Transportation     Lack of Transportation (Medical): No     Lack of Transportation (Non-Medical): No   Physical Activity: Not on file   Stress: Not on file   Social Connections: Not on file   Intimate Partner Violence: Not on file   Housing Stability: Low Risk  (05/17/2022)  Housing Stability Vital Sign     Unable to Pay for Housing in the Last Year: No     Number of Places Lived in the Last Year: 1     Unstable  Housing in the Last Year: No       Family History:     Family History   Problem Relation Age of Onset    Emphysema Brother     Alcohol Abuse Father     Heart Disease Brother        Allergies:     Allergies   Allergen Reactions    Codeine Itching and Swelling    Oxycodone Anaphylaxis    Amoxicillin Diarrhea    Hydroxyzine Pamoate Itching and Other (See Comments)     Other reaction(s): Unknown    Meperidine Itching, Other (See Comments), Palpitations and Swelling     Other reaction(s): Unknown    Metoclopramide Itching, Other (See Comments) and Swelling     Other reaction(s): Other (See Comments)  Reaction unknown   Reaction unknown     Morphine Nausea And Vomiting and Other (See Comments)    Oxycodone-Acetaminophen Itching    Prochlorperazine Itching and Other (See Comments)     Other reaction(s): Unknown (comments)  Other reaction(s): Unknown  Unsure of reaction    Hydroxyzine Hcl Hives    Other Other (See Comments)     VISTAID  ANESTHESIA         Home Medications:   Medications Prior to Admission: bumetanide (BUMEX) 1 MG tablet, Take 1 tablet by mouth daily Resume on 05/14/2022 (Patient not taking: Reported on 05/17/2022)  pravastatin (PRAVACHOL) 10 MG tablet, Take 1 tablet by mouth daily (Patient not taking: Reported on 05/17/2022)  meloxicam (MOBIC) 7.5 MG tablet, Take 1 tablet by mouth daily  omeprazole (PRILOSEC) 40 MG delayed release capsule, Take 1 capsule by mouth daily  methocarbamol (ROBAXIN) 500 MG tablet, Take 1 tablet by mouth daily as needed  acetaminophen (TYLENOL) 325 MG tablet, Take 2 tablets by mouth every 6 hours as needed  allopurinol (ZYLOPRIM) 300 MG tablet, Take 1 tablet by mouth daily  aspirin 81 MG chewable tablet, Take 1 tablet by mouth daily  gabapentin (NEURONTIN) 600 MG tablet, Take 2 tablets by mouth 4 times daily.  metoprolol succinate (TOPROL XL) 25 MG extended release tablet, TAKE 1 TABLET BY MOUTH EVERY DAY (Patient not taking: Reported on 05/17/2022)  lipase-protease-amylase (CREON)  24000-76000 units delayed release capsule, Take 2 capsules by mouth 3 times daily (with meals)  tamsulosin (FLOMAX) 0.4 MG capsule, Take 2 capsules by mouth daily  temazepam (RESTORIL) 7.5 MG capsule, Take 2 capsules by mouth nightly as needed for Anxiety or Sleep. (Patient not taking: Reported on 05/17/2022)    Current Medications:     Current Facility-Administered Medications   Medication Dose Route Frequency Provider Last Rate Last Admin    *Pharmacy to dose vancomycin  1 each Other RX Placeholder Simoes, Ruchita, MD        [START ON 05/20/2022] vancomycin (VANCOCIN) 1000 mg in sodium chloride 0.9% 250 mL IVPB  1,000 mg IntraVENous Q24H Simoes, Ruchita, MD        [START ON 05/22/2022] Vancomycin Trough Reminder  1 each Other Once Simoes, Ruchita, MD        gabapentin (NEURONTIN) capsule 300 mg  300 mg Oral TID Simoes, Ruchita, MD   300 mg at 05/19/22 1024    acetaminophen (TYLENOL) tablet 650 mg  650 mg Oral Q6H PRN Matriano, Crista Curb,  MD   650 mg at 05/18/22 2320    0.9 % sodium chloride infusion   IntraVENous PRN Levasseur, Isabel Caprice, MD        pantoprazole (PROTONIX) 40 mg in sodium chloride (PF) 0.9 % 10 mL injection  40 mg IntraVENous Q12H Matriano, Crista Curb, MD   40 mg at 05/19/22 1025    naloxone [facility]) injection 0.4 mg  0.4 mg IntraVENous PRN Illene Regulus, MD        ondansetron [facility]) injection 4 mg  4 mg IntraVENous Q6H PRN Illene Regulus, MD   4 mg at 05/17/22 1020    melatonin tablet 3 mg  3 mg Oral Nightly PRN Illene Regulus, MD        allopurinol (ZYLOPRIM) tablet 300 mg  300 mg Oral Daily Matriano, Crista Curb, MD   300 mg at 05/19/22 1024    lipase-protease-amylase (CREON) delayed release capsule 48,000 Units  48,000 Units Oral TID WC Matriano, Crista Curb, MD   48,000 Units at 05/19/22 1157    pravastatin (PRAVACHOL) tablet 10 mg  10 mg Oral Daily Illene Regulus, MD   10 mg at 05/19/22 1024    tamsulosin (FLOMAX) capsule 0.8 mg  0.8 mg Oral Daily Illene Regulus, MD   0.8 mg at 05/19/22 1024    temazepam (RESTORIL) capsule 15 mg  15 mg Oral Nightly PRN Illene Regulus, MD   15 mg at 05/17/22 2320    methocarbamol (ROBAXIN) tablet 500 mg  500 mg Oral Daily PRN Illene Regulus, MD             Labs: Results:   Chemistry Recent Labs     05/16/22  1830 05/17/22  0641 05/17/22  1226 05/18/22  0430 05/19/22  0220   NA 137 138 137 139 140   K 5.0 4.9 4.7 4.7 4.4   CL 104 109* 107 112* 110*   CO2 25 22  --  24 24   BUN 36* 35* 30* 21 13   TP 6.0 4.8*  --   --   --       CBC w/Diff Recent Labs     05/16/22  1830 05/16/22  2316 05/18/22  0430 05/18/22  1548 05/19/22  0220   WBC 9.5  --  5.7  --  4.6   RBC 2.54*  --  3.19*  --  2.93*   HGB 8.2*   < > 9.6* 8.8* 9.3*   HCT 25.4*   < > 29.3* 27.1* 28.1*   PLT 183  --  109*  --  117*    < > = values in this interval not displayed.      Microbiology Invalid input(s): "CULT"       Imaging-    CTA ABDOMEN PELVIS W WO CONTRAST    Result Date: 05/18/2022  Indication: GI Bleeding. History of diverticulosis. Whipple procedure.Marland Kitchen     IMPRESSION: No active gastrointestinal bleeding. Severe diffuse atherosclerosis. Whipple procedure. Stable hepatic hypodensities. Bilateral renal cortical atrophy. Diverticulosis. Penile implant. Diffuse spinal degenerative change. Comment: CT images of the abdomen and pelvis were obtained prior to and following the administration of intravenous contrast. MIPS projections were obtained. This was compared with May 16, 2022 and January 25, 2022.Marland Kitchen No active gastrointestinal bleeding. The lung bases are hyperaerated. Coronary calcification is observed. Atherosclerotic changes are present throughout. No aneurysm. The celiac, superior mesenteric, inferior mesenteric,  iliac and common femoral arteries are patent. 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 Whipple procedure has been performed. Pancreatic head and gallbladder have been resected. Air is present in  the nondependent portion of the biliary tree. The liver is of mild low attenuation from fatty infiltration. Small stable hypodensities are noted in the liver. The largest measures 1 cm in the right lobe Spleen and adrenal glands are unremarkable. Bilateral renal cortical atrophy is present with cysts. Pelvic viscera is unremarkable. Penile implant is noted. Diverticulosis is present in the colon without diverticulitis. No bowel obstruction, free intraperitoneal air, free fluid or appendicitis. Degenerative changes are present throughout the spine. Laminectomy and fusion is been performed at L5-S1. All CT exams at this facility use one or more dose reduction techniques including automatic exposure control, mA/kV adjustment per patient's size, or iterative reconstruction technique. Electronically signed by: Nelda Severe, MD 05/18/2022 3:51 PM EDT          Workstation ID: [id number]     CTA ABDOMEN PELVIS W WO CONTRAST    Result Date: 05/16/2022  DICOM format image data is available to non-affiliated external healthcare facilities or entities on a secure, media free, reciprocally searchable basis with patient authorization for 12 months following the date of the study. Clinical History:rectal bleeding.  Examination: CTA scan of the abdomen and pelvis without and with contrast 05/16/2022 10:22 PM EDT.  5 mm noncontrast spiral scanning was performed from the lung bases to the symphysis pubis. 3 mm spiral scanning is performed with intravenous contrast in the arterial phase from the lung bases to the symphysis pubis. 3 D MIP, coronal and sagittal reconstruction images has been obtained. Correlation: 01/25/2022 FINDINGS: Lung bases are clear. Atherosclerotic calcification of the coronary arteries, abdominal aorta and mesenteric arteries. Degenerative changes of the spine. Penile implant. Pneumobilia likely related to sphincterotomy. Gallbladder is absent. Multiple small hepatic and renal hypodensities are too small to  characterize. There are too numerous to characterize renal hypodensities. Cyst with wall calcification lower pole left kidney. Spleen, pancreatic tail is unremarkable, status post Whipple's. Adrenal glands and bladder are unremarkable. Prostate is enlarged. There are colonic diverticuli. Terminal ileum, appendix, stomach and small bowel loops are within normal limits. Dense atherosclerotic calcification of the abdominal aorta, mesenteric, renal and iliac arteries. No lymph node enlargement or free fluid. Nonobstructing 4 mm left nephrolithiasis.     IMPRESSION: 1. No active GI bleed. 2. Diverticulosis. 3. Status post Whipple's procedure. 4. Multiple hepatic and renal hypodensities some small and too numerous to characterize. 5. 4 mm nonobstructing left nephrolithiasis. Electronically signed by: Kathlene Cote, MD 05/16/2022 10:34 PM EDT          Workstation ID: [id number]       ---------------------------------------------------------------------------------------------------------------  I have independently examined the patient and reviewed all lab studies and imgaing as well as review of nursing notes and physican notes from the past 24 hours. The plan of care has been discussed with the patient/relative and all questions are answered.     Dragon medical dictation software was used for portions of this report. Unintended errors may occur.     Wylene Men, APRN - NP  05/19/2022    [place]  [phone]

## 2022-05-19 NOTE — Plan of Care (Signed)
Problem: Safety - Adult  Goal: Free from fall injury  05/19/2022 0820 by Laverna Peace, RN  Outcome: Progressing  05/18/2022 2234 by Milagros Reap, RN  Outcome: Progressing     Problem: Skin/Tissue Integrity  Goal: Absence of new skin breakdown  Description: 1.  Monitor for areas of redness and/or skin breakdown  2.  Assess vascular access sites hourly  3.  Every 4-6 hours minimum:  Change oxygen saturation probe site  4.  Every 4-6 hours:  If on nasal continuous positive airway pressure, respiratory therapy assess nares and determine need for appliance change or resting period.  Outcome: Progressing     Problem: Pain  Goal: Verbalizes/displays adequate comfort level or baseline comfort level  Outcome: Progressing

## 2022-05-19 NOTE — Progress Notes (Addendum)
INTERNAL MEDICINE PROGRESS NOTE    Date of note:      May 19, 2022    Patient:               Tommy Brown, 77 y.o., male  Admit Date:        05/16/2022  Length of Stay:  3 day(s)    Problem List:   Patient Active Problem List   Diagnosis    UTI (urinary tract infection)    History of syncope    AKI (acute kidney injury) ([place])    Hypertensive retinopathy, bilateral    Hx of CABG    Intraductal papillary mucinous neoplasm    Vitreous degeneration, left eye    Clostridium difficile diarrhea    Syncope    Atherosclerosis of coronary artery    Chest pain    H/O neck surgery    Palpitations    Bacteremia due to Klebsiella pneumoniae    Essential hypertension    Coronary artery disease (CAD) excluded    Pneumonia    Testosterone deficiency    Cervical disc disease    Port-A-Cath in place    AR (allergic rhinitis)    Acute chest pain    Metabolic acidosis    Murmur, cardiac    Sepsis ([place])    Liver cyst    BPH (benign prostatic hyperplasia)    Carotid artery disease (HCC)    Seasonal allergies    Malignant neoplasm of head of pancreas (HCC)    Hypercholesteremia    Puckering of macula, right eye    Tributary (branch) retinal vein occlusion, right eye, stable    Dyslipidemia    Ejection fraction < 50%    Diarrhea    Recurrent nephrolithiasis    Preop cardiovascular exam    Gout    Steatorrhea    Pneumonia due to COVID-19 virus    History of coronary artery disease    Rectal bleeding    Lower GI bleed       Subjective + interval history     Had 3 brown bowel movements overnight, no bleeding   Assessment :     Acute painless hematochezia - suspect diverticular bleeding  Acute on chronic anemia due to GI bleeding  Recent hospitalization from 05/09/22 to 05/12/22 for lower GI bleeding  CKD III A  CAD s/p CABG  Hypertension  dyslipidemia  Pancreatic cancer s/p Whipple's procedure  BPH  Strep viridans bacteremia  Moderate aortic stenosis    Plan :          Patient was recently discharged on 05/12/22 - presented to the ER on  3/21 for rectal bleeding   CTA  abdomen 05/16/22  - no active GI bleed.  + diverticulosis  Hemoglobin went down to 6.2 - s/p 3 units PRBC transfusion   CTA repeated on 3/23 for bleeding - no source or active GI bleed was found    Hemoglobin is stable 9.3  Continue PPI  Discussed with GI Dr. Lucien Mons. Plan to advance diet today and watch for bleeding     S/p EGD 05/17/22 - normal  Flex sigmoidoscopy 05/17/22 - Large amount of old blood and unable to adequately visualize the colonic mucosa.  Scope not advanced beyond the sigmoid colon at 20cm.  No active bleeding seen    Blood cultures from 05/16/22 are positive for strep viridans.  Start IV Vancomycin  Repeat blood cultures  Check TTE  Will get ID input. Discussed with BJ Nicki Reaper  Discussed with RN and updated patient's wife. Transfer to telemetry floor  Anticipate discharge in a day or two    Objective:     Visit Vitals  BP 129/73   Pulse 71   Temp 97.4 F (36.3 C) (Temporal)   Resp 18   Ht 1.676 m (5\' 6" )   Wt 58.6 kg (129 lb 3 oz)   SpO2 97%   BMI 20.85 kg/m                Intake/Output Summary (Last 24 hours) at 05/19/2022 1018  Last data filed at 05/19/2022 0800  Gross per 24 hour   Intake --   Output 1925 ml   Net -1925 ml       Physical Exam:     GEN - AAOx3, NAD  HEENT - mucous membranes moist  Neck - supple, no JVD  Cardiac - RRR, S1, S2, 2/6 systolic murmur  Chest/Lungs - clear to auscultation without wheezes or rhonchi  Abdomen - soft, nontender,   Extremities - no clubbing/ cyanosis/ edema  Neuro - follows commands, ambulating    Current medications:       Current Facility-Administered Medications:     vancomycin (VANCOCIN) 1250 mg in sodium chloride 0.9% 250 mL IVPB, 1,250 mg, IntraVENous, Once, Lakia Gritton, MD    *Pharmacy to dose vancomycin, 1 each, Other, RX Placeholder, Dionisia Pacholski, MD    gabapentin (NEURONTIN) capsule 300 mg, 300 mg, Oral, TID, Lynsay Fesperman, MD, 300 mg at 05/18/22 2135    acetaminophen (TYLENOL) tablet 650 mg, 650 mg, Oral,  Q6H PRN, Matriano, Crista Curb, MD, 650 mg at 05/18/22 2320    0.9 % sodium chloride infusion, , IntraVENous, PRN, Levasseur, Isabel Caprice, MD    pantoprazole (PROTONIX) 40 mg in sodium chloride (PF) 0.9 % 10 mL injection, 40 mg, IntraVENous, Q12H, Matriano, Crista Curb, MD, 40 mg at 05/18/22 2320    naloxone [facility]) injection 0.4 mg, 0.4 mg, IntraVENous, PRN, Matriano, Crista Curb, MD    ondansetron [facility]) injection 4 mg, 4 mg, IntraVENous, Q6H PRN, Matriano, Crista Curb, MD, 4 mg at 05/17/22 1020    melatonin tablet 3 mg, 3 mg, Oral, Nightly PRN, Matriano, Crista Curb, MD    allopurinol (ZYLOPRIM) tablet 300 mg, 300 mg, Oral, Daily, Matriano, Patriciaann Clan H, MD, 300 mg at 05/18/22 1007    lipase-protease-amylase (CREON) delayed release capsule 48,000 Units, 48,000 Units, Oral, TID WC, Matriano, Crista Curb, MD, 48,000 Units at 05/18/22 1650    pravastatin (PRAVACHOL) tablet 10 mg, 10 mg, Oral, Daily, Matriano, Crista Curb, MD, 10 mg at 05/18/22 1007    tamsulosin (FLOMAX) capsule 0.8 mg, 0.8 mg, Oral, Daily, Matriano, Patriciaann Clan H, MD, 0.8 mg at 05/18/22 1007    temazepam (RESTORIL) capsule 15 mg, 15 mg, Oral, Nightly PRN, Matriano, Crista Curb, MD, 15 mg at 05/17/22 2320    methocarbamol (ROBAXIN) tablet 500 mg, 500 mg, Oral, Daily PRN, Matriano, Crista Curb, MD    Labs:     Recent Results (from the past 24 hour(s))   Hemoglobin and Hematocrit    Collection Time: 05/18/22  3:48 PM   Result Value Ref Range    Hemoglobin 8.8 (L) 13.0 - 18.0 gm/dl    Hematocrit 27.1 (L) 36.0 - 55.0 %   CBC with Auto Differential    Collection Time: 05/19/22  2:20 AM   Result Value Ref Range    WBC 4.6 4.0 -  11.0 1000/mm3    RBC 2.93 (L) 3.80 - 5.70 M/uL    Hemoglobin 9.3 (L) 13.0 - 18.0 gm/dl    Hematocrit 28.1 (L) 36.0 - 55.0 %    MCV 95.9 80.0 - 98.0 fL    MCH 31.7 23.0 - 34.6 pg    MCHC 33.1 30.0 - 36.0 gm/dl    Platelets 117 (L) 140 - 450 1000/mm3    MPV 11.1 (H) 6.0 - 10.0 fL    RDW 58.5 (H) 35.1 - 43.9      Nucleated RBCs 0 0 - 0       Immature Granulocytes 0.7 0.0 - 3.0 %    Neutrophils Segmented 62.5 34 - 64 %    Lymphocytes 24.6 (L) 28 - 48 %    Monocytes 8.7 1 - 13 %    Eosinophils 3.3 0 - 5 %    Basophils 0.2 0 - 3 %   Basic Metabolic Panel    Collection Time: 05/19/22  2:20 AM   Result Value Ref Range    Potassium 4.4 3.5 - 5.1 mEq/L    Chloride 110 (H) 98 - 107 mEq/L    Sodium 140 136 - 145 mEq/L    CO2 24 20 - 31 mEq/L    Glucose 99 74 - 106 mg/dl    BUN 13 9 - 23 mg/dl    Creatinine 1.20 0.70 - 1.30 mg/dl    GFR African American >60      GFR Non-African American >60      Calcium 8.4 (L) 8.7 - 10.4 mg/dl    Anion Gap 6 5 - 15 mmol/L       RAD Results:  CTA ABDOMEN PELVIS W WO CONTRAST    Result Date: 05/18/2022  Indication: GI Bleeding. History of diverticulosis. Whipple procedure.Marland Kitchen     IMPRESSION: No active gastrointestinal bleeding. Severe diffuse atherosclerosis. Whipple procedure. Stable hepatic hypodensities. Bilateral renal cortical atrophy. Diverticulosis. Penile implant. Diffuse spinal degenerative change. Comment: CT images of the abdomen and pelvis were obtained prior to and following the administration of intravenous contrast. MIPS projections were obtained. This was compared with May 16, 2022 and January 25, 2022.Marland Kitchen No active gastrointestinal bleeding. The lung bases are hyperaerated. Coronary calcification is observed. Atherosclerotic changes are present throughout. No aneurysm. The celiac, superior mesenteric, inferior mesenteric, iliac and common femoral arteries are patent. 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 Whipple procedure has been performed. Pancreatic head and gallbladder have been resected. Air is present in the nondependent portion of the biliary tree. The liver is of mild low attenuation from fatty infiltration. Small stable hypodensities are noted in the liver. The largest measures 1 cm in the right lobe Spleen and adrenal glands are unremarkable. Bilateral  renal cortical atrophy is present with cysts. Pelvic viscera is unremarkable. Penile implant is noted. Diverticulosis is present in the colon without diverticulitis. No bowel obstruction, free intraperitoneal air, free fluid or appendicitis. Degenerative changes are present throughout the spine. Laminectomy and fusion is been performed at L5-S1. All CT exams at this facility use one or more dose reduction techniques including automatic exposure control, mA/kV adjustment per patient's size, or iterative reconstruction technique. Electronically signed by: Nelda Severe, MD 05/18/2022 3:51 PM EDT          Workstation ID: [id number]     CTA ABDOMEN PELVIS W WO CONTRAST    Result Date: 05/16/2022  DICOM format image data is available to non-affiliated  external healthcare facilities or entities on a secure, media free, reciprocally searchable basis with patient authorization for 12 months following the date of the study. Clinical History:rectal bleeding.  Examination: CTA scan of the abdomen and pelvis without and with contrast 05/16/2022 10:22 PM EDT.  5 mm noncontrast spiral scanning was performed from the lung bases to the symphysis pubis. 3 mm spiral scanning is performed with intravenous contrast in the arterial phase from the lung bases to the symphysis pubis. 3 D MIP, coronal and sagittal reconstruction images has been obtained. Correlation: 01/25/2022 FINDINGS: Lung bases are clear. Atherosclerotic calcification of the coronary arteries, abdominal aorta and mesenteric arteries. Degenerative changes of the spine. Penile implant. Pneumobilia likely related to sphincterotomy. Gallbladder is absent. Multiple small hepatic and renal hypodensities are too small to characterize. There are too numerous to characterize renal hypodensities. Cyst with wall calcification lower pole left kidney. Spleen, pancreatic tail is unremarkable, status post Whipple's. Adrenal glands and bladder are unremarkable. Prostate is enlarged.  There are colonic diverticuli. Terminal ileum, appendix, stomach and small bowel loops are within normal limits. Dense atherosclerotic calcification of the abdominal aorta, mesenteric, renal and iliac arteries. No lymph node enlargement or free fluid. Nonobstructing 4 mm left nephrolithiasis.     IMPRESSION: 1. No active GI bleed. 2. Diverticulosis. 3. Status post Whipple's procedure. 4. Multiple hepatic and renal hypodensities some small and too numerous to characterize. 5. 4 mm nonobstructing left nephrolithiasis. Electronically signed by: Kathlene Cote, MD 05/16/2022 10:34 PM EDT          Workstation ID: [email or web address]         Total clinical care time was 39  minutes of which more than 50% was spent in coordination of care and counseling (time spent with patient/family face to face, physical exam, reviewing laboratory and imaging investigations, speaking with physicians and nursing staff involved in this patient's care).     Dragon medical dictation system was used for part of this note. Unintentional voice recognition errors may occur    Perfecto Kingdom,  MD  [facility]  May 19, 2022  Time: 10:18 AM

## 2022-05-19 NOTE — Progress Notes (Signed)
PAGER ID: [phone]  MESSAGE: [place]., [place](Rectal bleeding)critical lab: blood culture aerobic bottle showed gram(+) cocci in pairs, clusters and chains. [phone]. Thanks. Kenney Houseman RN

## 2022-05-19 NOTE — Telephone Encounter (Signed)
----------  DocumentID: [id number] (AP)-------------------------------------------              [facility]                       Patient Education Report         Name: Tommy Brown, Tommy Brown                  Date: 05/19/2022    MRN: [id number]                    Time: 1:11:59 PM         Patient ordered video: 'Patient Safety: Stay Safe While you are in the Hospital'    from [id number]_[id number] via phone number: [phone] at 1:11:59 PM    Description: This program outlines some of the precautions patients can take to ensure a speedy recovery without extra complications. The video emphasizes the importance of communicating with the healthcare team.

## 2022-05-19 NOTE — Progress Notes (Signed)
GI Progress Note    Admit Date:  05/16/2022     Assessment:     GI bleed.  Painless hematochezia.  Likely diverticular bleed.  CTA neg for evidence of active bleeding. EGD 05/17/22 normal. Colonoscopy 2022 with diverticulosis.  CTA abd/pelvis 05/18/22 with no active GI bleeding.  Acute anemia secondary to #1.  Hgb 9.3 this AM.  Dysphagia with solids  Pancreatic cancer s/p whipple  AKI  HTN/HLD  CAD s/p CABG, daily ASA  BPH  Chronic neuropathy  Gout    Plan:     Continue PPI  Continue to monitor H/H and transfuse as needed  Monitor for evidence of recurrent active GI bleeding  If recurrent bleeding then consider stat CTA abd/pelvis vs nuclear RBC bleeding scan  Advance diet as tolerate    Subjective:     Stool with blood yesterday.  No BM today.  Denies abdominal pain.    Patient Active Problem List   Diagnosis    UTI (urinary tract infection)    History of syncope    AKI (acute kidney injury) ([place])    Hypertensive retinopathy, bilateral    Hx of CABG    Intraductal papillary mucinous neoplasm    Vitreous degeneration, left eye    Clostridium difficile diarrhea    Syncope    Atherosclerosis of coronary artery    Chest pain    H/O neck surgery    Palpitations    Bacteremia due to Klebsiella pneumoniae    Essential hypertension    Coronary artery disease (CAD) excluded    Pneumonia    Testosterone deficiency    Cervical disc disease    Port-A-Cath in place    AR (allergic rhinitis)    Acute chest pain    Metabolic acidosis    Murmur, cardiac    Sepsis ([place])    Liver cyst    BPH (benign prostatic hyperplasia)    Carotid artery disease (HCC)    Seasonal allergies    Malignant neoplasm of head of pancreas (HCC)    Hypercholesteremia    Puckering of macula, right eye    Tributary (branch) retinal vein occlusion, right eye, stable    Dyslipidemia    Ejection fraction < 50%    Diarrhea    Recurrent nephrolithiasis    Preop cardiovascular exam    Gout    Steatorrhea    Pneumonia due to COVID-19 virus    History of coronary  artery disease    Rectal bleeding    Lower GI bleed        Objective:     BP 129/73   Pulse 71   Temp 97.4 F (36.3 C) (Temporal)   Resp 18   Ht 1.676 m (5\' 6" )   Wt 58.6 kg (129 lb 3 oz)   SpO2 97%   BMI 20.85 kg/m    Temp (24hrs), Avg:98.1 F (36.7 C), Min:97.4 F (36.3 C), Max:98.5 F (36.9 C)       Physical Exam:  General: Lying comfortably, in no acute distress  Lungs: Clear anteriorly  CV: +s1s2  Abd: soft, NT, +BS      Intake/Output Summary (Last 24 hours) at 05/19/2022 [id number]  Last data filed at 05/19/2022 0800  Gross per 24 hour   Intake 240 ml   Output 2075 ml   Net -1835 ml       Current Facility-Administered Medications   Medication Dose Route Frequency Provider Last Rate Last Admin    vancomycin (VANCOCIN) 1250 mg  in sodium chloride 0.9% 250 mL IVPB  1,250 mg IntraVENous Once Simoes, Ruchita, MD        *Pharmacy to dose vancomycin  1 each Other RX Placeholder Simoes, Ruchita, MD        gabapentin (NEURONTIN) capsule 300 mg  300 mg Oral TID Simoes, Ruchita, MD   300 mg at 05/18/22 2135    acetaminophen (TYLENOL) tablet 650 mg  650 mg Oral Q6H PRN Illene Regulus, MD   650 mg at 05/18/22 2320    0.9 % sodium chloride infusion   IntraVENous PRN Levasseur, Isabel Caprice, MD        pantoprazole (PROTONIX) 40 mg in sodium chloride (PF) 0.9 % 10 mL injection  40 mg IntraVENous Q12H Matriano, Crista Curb, MD   40 mg at 05/18/22 2320    naloxone [facility]) injection 0.4 mg  0.4 mg IntraVENous PRN Illene Regulus, MD        ondansetron [facility]) injection 4 mg  4 mg IntraVENous Q6H PRN Illene Regulus, MD   4 mg at 05/17/22 1020    melatonin tablet 3 mg  3 mg Oral Nightly PRN Illene Regulus, MD        allopurinol (ZYLOPRIM) tablet 300 mg  300 mg Oral Daily Illene Regulus, MD   300 mg at 05/18/22 1007    lipase-protease-amylase (CREON) delayed release capsule 48,000 Units  48,000 Units Oral TID WC Illene Regulus, MD   48,000 Units at 05/18/22 1650    pravastatin (PRAVACHOL)  tablet 10 mg  10 mg Oral Daily Illene Regulus, MD   10 mg at 05/18/22 1007    tamsulosin (FLOMAX) capsule 0.8 mg  0.8 mg Oral Daily Illene Regulus, MD   0.8 mg at 05/18/22 1007    temazepam (RESTORIL) capsule 15 mg  15 mg Oral Nightly PRN Illene Regulus, MD   15 mg at 05/17/22 2320    methocarbamol (ROBAXIN) tablet 500 mg  500 mg Oral Daily PRN Illene Regulus, MD           CBC w/Diff   Lab Results   Component Value Date/Time    WBC 4.6 05/19/2022 02:20 AM    WBC 5.7 05/18/2022 04:30 AM    WBC 9.5 05/16/2022 06:30 PM    RBC 2.93 (L) 05/19/2022 02:20 AM    RBC 3.19 (L) 05/18/2022 04:30 AM    RBC 2.54 (L) 05/16/2022 06:30 PM    HGB 9.3 (L) 05/19/2022 02:20 AM    HGB 8.8 (L) 05/18/2022 03:48 PM    HGB 9.6 (L) 05/18/2022 04:30 AM    HCT 28.1 (L) 05/19/2022 02:20 AM    HCT 27.1 (L) 05/18/2022 03:48 PM    HCT 29.3 (L) 05/18/2022 04:30 AM    MCV 95.9 05/19/2022 02:20 AM    MCV 91.8 05/18/2022 04:30 AM    MCV 100.0 (H) 05/16/2022 06:30 PM    MCH 31.7 05/19/2022 02:20 AM    MCH 30.1 05/18/2022 04:30 AM    MCH 32.3 05/16/2022 06:30 PM    MCHC 33.1 05/19/2022 02:20 AM    MCHC 32.8 05/18/2022 04:30 AM    MCHC 32.3 05/16/2022 06:30 PM    RDW 58.5 (H) 05/19/2022 02:20 AM    RDW 57.1 (H) 05/18/2022 04:30 AM    RDW 56.2 (H) 05/16/2022 06:30 PM    PLT 117 (L) 05/19/2022 02:20 AM    PLT 109 (  L) 05/18/2022 04:30 AM    PLT 183 05/16/2022 06:30 PM    MPV 11.1 (H) 05/19/2022 02:20 AM    MPV 10.9 (H) 05/18/2022 04:30 AM    MPV 11.1 (H) 05/16/2022 06:30 PM     Lab Results   Component Value Date/Time    MONOS 8.7 05/19/2022 02:20 AM    MONOS 8.6 05/18/2022 04:30 AM    MONOS 8.8 05/16/2022 06:30 PM        Hepatic Function   Lab Results   Component Value Date/Time    ALT 35 05/17/2022 06:41 AM    ALT 48 05/16/2022 06:30 PM    ALT 62 (H) 05/10/2022 02:40 AM    TP 4.8 (L) 05/17/2022 06:41 AM    TP 6.0 05/16/2022 06:30 PM    TP 4.8 (L) 05/10/2022 02:40 AM        Pancreatic Enzymes   No results found for: "AML"      Basic Metabolic Profile   Lab Results   Component Value Date/Time    NA 140 05/19/2022 02:20 AM    NA 139 05/18/2022 04:30 AM    NA 137 05/17/2022 12:26 PM    K 4.4 05/19/2022 02:20 AM    K 4.7 05/18/2022 04:30 AM    K 4.7 05/17/2022 12:26 PM    CL 110 (H) 05/19/2022 02:20 AM    CL 112 (H) 05/18/2022 04:30 AM    CL 107 05/17/2022 12:26 PM    CO2 24 05/19/2022 02:20 AM    CO2 24 05/18/2022 04:30 AM    CO2 22 05/17/2022 06:41 AM    CO2 23.0 (L) 01/12/2019 05:27 AM    CO2 23.0 (L) 01/12/2019 04:17 AM    BUN 13 05/19/2022 02:20 AM    BUN 21 05/18/2022 04:30 AM    BUN 30 (H) 05/17/2022 12:26 PM    MG 1.8 05/12/2022 04:15 AM    MG 1.9 05/11/2022 10:49 AM    MG 2.1 09/25/2020 03:00 PM    PHOS 3.2 05/12/2022 04:15 AM    PHOS 3.2 05/11/2022 10:49 AM    PHOS 3.0 07/17/2018 11:41 AM        Coagulation   Lab Results   Component Value Date/Time    INR 1.1 05/16/2022 06:30 PM    INR 1.1 05/10/2022 02:40 AM    INR 1.1 05/09/2022 04:40 PM          Microbiology Results  Invalid input(s): "CULT"    Radiology Results  CTA ABDOMEN PELVIS W WO CONTRAST    Result Date: 05/16/2022  DICOM format image data is available to non-affiliated external healthcare facilities or entities on a secure, media free, reciprocally searchable basis with patient authorization for 12 months following the date of the study. Clinical History:rectal bleeding.  Examination: CTA scan of the abdomen and pelvis without and with contrast 05/16/2022 10:22 PM EDT.  5 mm noncontrast spiral scanning was performed from the lung bases to the symphysis pubis. 3 mm spiral scanning is performed with intravenous contrast in the arterial phase from the lung bases to the symphysis pubis. 3 D MIP, coronal and sagittal reconstruction images has been obtained. Correlation: 01/25/2022 FINDINGS: Lung bases are clear. Atherosclerotic calcification of the coronary arteries, abdominal aorta and mesenteric arteries. Degenerative changes of the spine. Penile implant. Pneumobilia likely  related to sphincterotomy. Gallbladder is absent. Multiple small hepatic and renal hypodensities are too small to characterize. There are too numerous to characterize renal hypodensities. Cyst with wall calcification lower pole  left kidney. Spleen, pancreatic tail is unremarkable, status post Whipple's. Adrenal glands and bladder are unremarkable. Prostate is enlarged. There are colonic diverticuli. Terminal ileum, appendix, stomach and small bowel loops are within normal limits. Dense atherosclerotic calcification of the abdominal aorta, mesenteric, renal and iliac arteries. No lymph node enlargement or free fluid. Nonobstructing 4 mm left nephrolithiasis.     IMPRESSION: 1. No active GI bleed. 2. Diverticulosis. 3. Status post Whipple's procedure. 4. Multiple hepatic and renal hypodensities some small and too numerous to characterize. 5. 4 mm nonobstructing left nephrolithiasis. Electronically signed by: Kathlene Cote, MD 05/16/2022 10:34 PM EDT          Workstation ID: [email or web address]. Lucien Mons, M.D.  [place] Office [phone]

## 2022-05-20 LAB — BASIC METABOLIC PANEL
Anion Gap: 5 mmol/L (ref 5–15)
BUN: 15 mg/dl (ref 9–23)
CO2: 24 mEq/L (ref 20–31)
Calcium: 8.3 mg/dl — ABNORMAL LOW (ref 8.7–10.4)
Chloride: 111 mEq/L — ABNORMAL HIGH (ref 98–107)
Creatinine: 1.17 mg/dl (ref 0.70–1.30)
GFR African American: >60
GFR Non-African American: >60
Glucose: 99 mg/dl (ref 74–106)
Potassium: 4.3 mEq/L (ref 3.5–5.1)
Sodium: 140 mEq/L (ref 136–145)

## 2022-05-20 LAB — CBC WITH AUTO DIFFERENTIAL
Basophils: 0.2 % (ref 0–3)
Eosinophils: 3.3 % (ref 0–5)
Hematocrit: 29.2 % — ABNORMAL LOW (ref 36.0–55.0)
Hemoglobin: 9.6 gm/dl — ABNORMAL LOW (ref 13.0–18.0)
Immature Granulocytes: 0.9 % (ref 0.0–3.0)
Lymphocytes: 23.9 % — ABNORMAL LOW (ref 28–48)
MCH: 31.7 pg (ref 23.0–34.6)
MCHC: 32.9 gm/dl (ref 30.0–36.0)
MCV: 96.4 fL (ref 80.0–98.0)
MPV: 10.6 fL — ABNORMAL HIGH (ref 6.0–10.0)
Monocytes: 8.6 % (ref 1–13)
Neutrophils Segmented: 63.1 % (ref 34–64)
Nucleated RBCs: 0 (ref 0–0)
Platelets: 122 10*3/uL — ABNORMAL LOW (ref 140–450)
RBC: 3.03 M/uL — ABNORMAL LOW (ref 3.80–5.70)
RDW: 57.6 — ABNORMAL HIGH (ref 35.1–43.9)
WBC: 4.6 10*3/uL (ref 4.0–11.0)

## 2022-05-20 LAB — ECHOCARDIOGRAM COMPLETE 2D W DOPPLER W COLOR: Left Ventricular Ejection Fraction: 49

## 2022-05-20 MED ORDER — HYDROCORTISONE ACETATE 25 MG RE SUPP
25 MG | Freq: Two times a day (BID) | RECTAL | Status: DC | PRN
Start: 2022-05-20 — End: 2022-05-23

## 2022-05-20 MED FILL — GABAPENTIN 300 MG PO CAPS: 300 MG | ORAL | Qty: 1

## 2022-05-20 MED FILL — TEMAZEPAM 7.5 MG PO CAPS: 7.5 MG | ORAL | Qty: 2

## 2022-05-20 MED FILL — VANCOMYCIN (VANCOCIN) 1000 MG IN SODIUM CHLORIDE 0.9% 250 ML IVPB: Qty: 250

## 2022-05-20 MED FILL — CREON 6000-19000 UNITS PO CPEP: 6000-19000 units | ORAL | Qty: 2

## 2022-05-20 MED FILL — PANTOPRAZOLE SODIUM 40 MG IV SOLR: 40 MG | INTRAVENOUS | Qty: 40

## 2022-05-20 MED FILL — ALLOPURINOL 300 MG PO TABS: 300 MG | ORAL | Qty: 1

## 2022-05-20 MED FILL — TAMSULOSIN HCL 0.4 MG PO CAPS: 0.4 MG | ORAL | Qty: 2

## 2022-05-20 MED FILL — PRAVASTATIN SODIUM 10 MG PO TABS: 10 MG | ORAL | Qty: 1

## 2022-05-20 MED FILL — HYDROCORTISONE ACETATE 25 MG RE SUPP: 25 MG | RECTAL | Qty: 1

## 2022-05-20 NOTE — Consults (Addendum)
INFECTIOUS DISEASE CONSULT NOTE     Requested by: Dr. Ninetta Lights     Reason for consult: bacteremia     Date of admission: 05/16/2022     Date of consult: May 19, 2022     Infectious Disease Attending- Cosign    I have reviewed [name]'s  chart. I saw and examined the patient independently, personally did HPI and Assessment & Plan and discussed with the treatment team. I reviewed the NP's note and concur with it and made my edits as needed. I have discussed and formulated the Assessment and Plan. Laboratory, hemodynamic and radiological data were independently reviewed by myself.     77 year old male PMH HTN CAD SP CABG, pancreatic CA diagnosed 2006 status post Whipple radiation and chemo history of nephrolithiasis SP lithotripsy and stent, more recent history of [facility] admission 3/14-17 with a GI bleed, re-presented 321 with rectal bleeding with negative CT for source but nonobstructing left nephrolithiasis unrevealing EGD 3/22 with history of diverticulosis on prior colonoscopy GI suspected diverticular bleed.  We were consulted for bacteremia with GPC,, identified as viridans strep in 1 set, and alphahemolytic GPC and second set which showed GPC pairs chains and clusters preliminary.  ID was consulted and patient started on IV vancomycin yesterday with repeat blood cultures pending.    Tmax 97.9, other VSS 98% room air sat  WBC 4.6 Hgb 9.6 PLT 122, creatinine 1.17, admission LFTs not elevated BS 99.  Micro includes 3/21 blood cultures positive as above, 3/24 BC Tx x 2 pending  Radiology includes 3/23 CT AP no active GIB, severe diffuse atherosclerosis    Impression:    Bacteremia with preliminary reported viridans strep, possible same versus different isolate (GPC chains reportedly viridans strep, GPC pairs chains clusters reported with alpha strep), admitted with possible diverticular bleed, source may be from same, with unrevealing CT AP, not septic appearing, no elevated LFTs to suggest biliary  source with history of Whipple, echo pending    Recurrent GI bleed-GI following status post transfusions, 3/22 EGD normal with flex sig old blood in rectal vault, noted diverticulosis on prior colonoscopy    Pancreatic CA status post Whipple 2006    History of nephrolithiasis    Allergy to amoxicillin    Rec:  -Monitor on IV vancomycin  -Await ID and sensitivities of positive blood cultures  -Await echocardiogram  -Anemia management per others  -Await more information to determine route and duration of antibiotic therapy here  -See below for additional details    Thank you we will follow        Estelle Grumbles, MD, FIDSA  May 20, 2022              ABX:      Current abx Prior abx    Vanco 3/24-1        ASSESSMENT:       BSI-->Strep Viridans /Alpha hemolytic strep  -3/21 admission BC x 2 1/2 strep viridans, 1/2 GPC pairs, chains, clusters  -Repeat BC x 2 IP  -Afebrile with no elevation in WBC  -Monitor blood cultures  -2D echo pending  -Will continue Vanco and optimize based on culture results   GI bleed  -Recurrent rectal bleeding  -H&H nadir 6.2 and 19.1  -Status post transfusions  -3/22 EGD normal  -3/22 flex sig with old blood in rectal vault poor visualization   Recent hospitalization (3/14-3/17/2024) for GI bleed   Pancreatic cancer status post Whipple 2006  -Status post chemo/radiation  CAD-->CABG   History of nephrolithiasis  -June 2020 cystoscopy/laser lithotripsy  -01/25/2022 CTAP tiny nonobstructing bilateral renal calculi   History of COVID November 2020  -Treated with remdesivir, convalescent plasma, Decadron   History of strep pneumonia November 2020   Allergies: Amoxicillin (diarrhea)            RECOMMENDATIONS:      -Patient readmitted for GI bleed status post EGD and flex sig 3/22  -3/21 admitting blood cultures positive-->identified as strep viridans and waiting for further ID of second culture.?  Translocation from GI tract versus other  -No lines, no hardware  -Repeat blood cultures in  process  -2D echo pending    -Continue IV Vanco and will optimize based on final culture data     -Anemia mgt per others     -AKI resolved  -Avoid nephrotoxic medication  -Monitor blood cultures    -Monitor for ADR     -Reviewed clinical data and discussed plan of care as outlined by Dr. Kellie Moor  MICROBIOLOGY:   PREVIOUS CULTURE AND ID DATA  2020  4/27 UCx E. Coli  5/20 UCx E. Coli  6/22 UCx E. Coli  7/2 UCx E. coli  11/15   urine culture Klebsiella pneumonia, E. Coli              COVID 19+   Strep pneumo urine antigen positive    CURRENT CULTURE AND ID DATA  3/21     BC x 2-->1/2 strep viridans 1/2 GPC-->alphahemolytic strep  3/24     BC x 2  LINES AND CATHETERS:   PIV     HPI:   Mr. Oralia Rud is a 77 year old gentleman with history significant for HTN, CAD status post CABG, pancreatic cancer diagnosed 2006 status post Whipple, radiation, chemo, nephrolithiasis with previous lithotripsy and stent placement.     Patient was recently hospitalized [facility] 3/14-3/17/2024 having presented with blood in stools associated with fatigue and lightheadedness.  He was found to have maroon-colored stools.  No anticoagulation with the exception of daily aspirin which was held on admission but eventually restarted prior to discharge..  GI was consulted and recommended CTA if recurrent large bleed and suspected diverticular bleed admitting hemoglobin was 10.6 but during hospitalization remained stable with nadir of 8.2.     Presented to [facility] ER 3/21 with rectal bleeding.  Initial H&H was stable 8 and 25 but dropped to 6 and 19 requiring transfusions.  CTA and of abdomen and pelvis negative for active GI bleed evidence of diverticulosis with multiple hepatic and renal hypodensities small and too numerous to characterize as well as nonobstructing left nephrolithiasis.  EGD was done on 3/22 which was normal.  Revealed old blood but could not evaluate mucosa.  Serial H&H's have been followed and most recent is 9.3 and 28.1.      Pt denies  having fevers or chills prior to admission.  No sick contacts.  No mediport, joint replacements, stents. States he was recently seen in cardiology office and had ultrasound of his heart.      Blood cultures were ordered on admission with 1 of 2+ strep viridans and 1/2 alphahemolytic strep waiting for ID.  Patient has been afebrile, WBC C has remained normal and is 4.6 today with 63% segs and 25% lymphocytes.     ID has been consulted for evaluation and management of strep viridans bacteremia.     Past medical history:     Past Medical History:   Diagnosis Date  Arthritis     Benign prostate hyperplasia     Bladder infection     Chronic kidney disease     Coronary arteriosclerosis     Coronary atherosclerosis of artery bypass graft     Decreased testosterone level     Diverticular disease     Essential hypertension     Gout     History of acute renal failure     History of anemia     History of malignant neoplasm of pancreas     Hx of CABG     Kidney stone     Kidney stones     Neuropathy     NECK, SHOULDERS    Pancreatic cancer (HCC)     Sepsis ([place])        Past Surgical History:   Procedure Laterality Date    CABG, ARTERY-VEIN, FOUR  1995    CARDIAC CATHETERIZATION  2006    CERVICAL FUSION      COLONOSCOPY N/A 07/10/2020    DIAGNOSTIC COLONOSCOPY polypectomy w/hot snare, cold snare , Clip x 1  performed by Dante Gang, MD at [facility]      OTHER SURGICAL HISTORY  01/01/2011    whipple procedure done for pancreatic cancer    UROLOGICAL SURGERY  2012    Percutaneous extraction of a kidney stone w/ fragmentation procedure         Social History:   Married  Does not smoke, drink alcohol  Does not vape  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  Rides his bike to work  Social History     Socioeconomic History    Marital status: Married     Spouse name: Not on file    Number of children: Not on file    Years of  education: Not on file    Highest education level: Not on file   Occupational History    Not on file   Tobacco Use    Smoking status: Never    Smokeless tobacco: Never   Vaping Use    Vaping Use: Never used   Substance and Sexual Activity    Alcohol use: Not Currently    Drug use: Not Currently    Sexual activity: Defer   Other Topics Concern    Not on file   Social History Narrative    ** Merged History Encounter **          Social Determinants of Health     Financial Resource Strain: Not on file   Food Insecurity: No Food Insecurity (05/17/2022)    Hunger Vital Sign     Worried About Running Out of Food in the Last Year: Never true     Ran Out of Food in the Last Year: Never true   Transportation Needs: No Transportation Needs (05/17/2022)    PRAPARE - Armed forces logistics/support/administrative officer (Medical): No     Lack of Transportation (Non-Medical): No   Physical Activity: Not on file   Stress: Not on file   Social Connections: Not on file   Intimate Partner Violence: Not on file   Housing Stability: Low Risk  (05/17/2022)    Housing Stability Vital Sign     Unable to Pay for Housing in the Last Year: No     Number of Places Lived  in the Last Year: 1     Unstable Housing in the Last Year: No       Family History:     Family History   Problem Relation Age of Onset    Emphysema Brother     Alcohol Abuse Father     Heart Disease Brother        Allergies:     Allergies   Allergen Reactions    Codeine Itching and Swelling    Oxycodone Anaphylaxis    Amoxicillin Diarrhea    Hydroxyzine Pamoate Itching and Other (See Comments)     Other reaction(s): Unknown    Meperidine Itching, Other (See Comments), Palpitations and Swelling     Other reaction(s): Unknown    Metoclopramide Itching, Other (See Comments) and Swelling     Other reaction(s): Other (See Comments)  Reaction unknown   Reaction unknown     Morphine Nausea And Vomiting and Other (See Comments)    Oxycodone-Acetaminophen Itching    Prochlorperazine Itching and Other  (See Comments)     Other reaction(s): Unknown (comments)  Other reaction(s): Unknown  Unsure of reaction    Hydroxyzine Hcl Hives    Other Other (See Comments)     VISTAID  ANESTHESIA         Home Medications:   Medications Prior to Admission: bumetanide (BUMEX) 1 MG tablet, Take 1 tablet by mouth daily Resume on 05/14/2022 (Patient not taking: Reported on 05/17/2022)  pravastatin (PRAVACHOL) 10 MG tablet, Take 1 tablet by mouth daily (Patient not taking: Reported on 05/17/2022)  meloxicam (MOBIC) 7.5 MG tablet, Take 1 tablet by mouth daily  omeprazole (PRILOSEC) 40 MG delayed release capsule, Take 1 capsule by mouth daily  methocarbamol (ROBAXIN) 500 MG tablet, Take 1 tablet by mouth daily as needed  acetaminophen (TYLENOL) 325 MG tablet, Take 2 tablets by mouth every 6 hours as needed  allopurinol (ZYLOPRIM) 300 MG tablet, Take 1 tablet by mouth daily  aspirin 81 MG chewable tablet, Take 1 tablet by mouth daily  gabapentin (NEURONTIN) 600 MG tablet, Take 2 tablets by mouth 4 times daily.  metoprolol succinate (TOPROL XL) 25 MG extended release tablet, TAKE 1 TABLET BY MOUTH EVERY DAY (Patient not taking: Reported on 05/17/2022)  lipase-protease-amylase (CREON) 24000-76000 units delayed release capsule, Take 2 capsules by mouth 3 times daily (with meals)  tamsulosin (FLOMAX) 0.4 MG capsule, Take 2 capsules by mouth daily  temazepam (RESTORIL) 7.5 MG capsule, Take 2 capsules by mouth nightly as needed for Anxiety or Sleep. (Patient not taking: Reported on 05/17/2022)     Current Medications:     Current Facility-Administered Medications   Medication Dose Route Frequency Provider Last Rate Last Admin    *Pharmacy to dose vancomycin  1 each Other RX Placeholder Simoes, Ruchita, MD        vancomycin (VANCOCIN) 1000 mg in sodium chloride 0.9% 250 mL IVPB  1,000 mg IntraVENous Q24H Simoes, Ruchita, MD        [START ON 05/22/2022] Vancomycin Trough Reminder  1 each Other Once Simoes, Ruchita, MD        gabapentin (NEURONTIN)  capsule 300 mg  300 mg Oral TID Simoes, Ruchita, MD   300 mg at 05/20/22 0847    acetaminophen (TYLENOL) tablet 650 mg  650 mg Oral Q6H PRN Illene Regulus, MD   650 mg at 05/18/22 2320    0.9 % sodium chloride infusion   IntraVENous PRN Levasseur, Isabel Caprice, MD  pantoprazole (PROTONIX) 40 mg in sodium chloride (PF) 0.9 % 10 mL injection  40 mg IntraVENous Q12H Matriano, Crista Curb, MD   40 mg at 05/19/22 2234    naloxone [facility]) injection 0.4 mg  0.4 mg IntraVENous PRN Illene Regulus, MD        ondansetron [facility]) injection 4 mg  4 mg IntraVENous Q6H PRN Matriano, Crista Curb, MD   4 mg at 05/17/22 1020    melatonin tablet 3 mg  3 mg Oral Nightly PRN Matriano, Crista Curb, MD        allopurinol (ZYLOPRIM) tablet 300 mg  300 mg Oral Daily Matriano, Crista Curb, MD   300 mg at 05/20/22 0848    lipase-protease-amylase (CREON) delayed release capsule 48,000 Units  48,000 Units Oral TID WC Matriano, Crista Curb, MD   48,000 Units at 05/20/22 0848    pravastatin (PRAVACHOL) tablet 10 mg  10 mg Oral Daily Matriano, Crista Curb, MD   10 mg at 05/19/22 1024    tamsulosin (FLOMAX) capsule 0.8 mg  0.8 mg Oral Daily Illene Regulus, MD   0.8 mg at 05/20/22 0847    temazepam (RESTORIL) capsule 15 mg  15 mg Oral Nightly PRN Illene Regulus, MD   15 mg at 05/19/22 2338    methocarbamol (ROBAXIN) tablet 500 mg  500 mg Oral Daily PRN Matriano, Crista Curb, MD           Review of Systems:   12 points ROS done. Pertinent positives and negatives are as follows, ROS otherwise negative.    Pt without fever, chills, sweats.  Denies headaches, dizziness.  Denies issues with appetite.  No choking on solids or liquids.  He states rides his bike to [employer] where he works 5 days a week for 4 hours.  He has had no recent injuries.  He has had 1 episode of bright red blood in the stool which was yesterday since admission otherwise stools are normal  He has had no shortness of breath, cough, sputum  production  No chest pain or palpitations  No burning urgency hesitancy or frequency of urination.      Physical Exam:  Vitals  Temp (24hrs), Avg:97.3 F (36.3 C), Min:96.8 F (36 C), Max:97.9 F (36.6 C)    BP 114/68   Pulse 71   Temp 96.8 F (36 C) (Temporal)   Resp 18   Ht 1.676 m (5\' 6" )   Wt 58.6 kg (129 lb 3 oz)   SpO2 97%   BMI 20.85 kg/m     General: No apparent distress, stable vitals  HEENT: Normocephalic, mucous membranes pink, moist, no oral lesions, no thrush.  Broken tooth noted posterior right lower jaw does not appear to abscess  Neck: Supple, no lymphadenopathy, bilateral carotid bruits  Chest: Symmetrical expansion  Lungs: Decreased in bases but clear bilaterally  Heart:. S1-S2 regular without S3; 2/6 systolic murmur  Abdomen: Flat, nontender, active bowel sounds  Musculoskeletal: No lower extremity edema  CNS: Awake, alert, oriented x 3.  Smile symmetric tongue midline  SKIN: No rash no jaundice        Labs: Results:   Chemistry Recent Labs     05/18/22  0430 05/19/22  0220 05/20/22  0653   NA 139 140 140   K 4.7 4.4 4.3   CL 112* 110* 111*   CO2 24 24 24    BUN 21 13 15  CBC w/Diff Recent Labs     05/18/22  0430 05/18/22  1548 05/19/22  0220 05/20/22  0653   WBC 5.7  --  4.6 4.6   RBC 3.19*  --  2.93* 3.03*   HGB 9.6* 8.8* 9.3* 9.6*   HCT 29.3* 27.1* 28.1* 29.2*   PLT 109*  --  117* 122*      Microbiology Invalid input(s): "CULT"       Imaging-    CTA ABDOMEN PELVIS W WO CONTRAST    Result Date: 05/18/2022  Indication: GI Bleeding. History of diverticulosis. Whipple procedure.Marland Kitchen     IMPRESSION: No active gastrointestinal bleeding. Severe diffuse atherosclerosis. Whipple procedure. Stable hepatic hypodensities. Bilateral renal cortical atrophy. Diverticulosis. Penile implant. Diffuse spinal degenerative change. Comment: CT images of the abdomen and pelvis were obtained prior to and following the administration of intravenous contrast. MIPS projections were obtained. This was compared  with May 16, 2022 and January 25, 2022.Marland Kitchen No active gastrointestinal bleeding. The lung bases are hyperaerated. Coronary calcification is observed. Atherosclerotic changes are present throughout. No aneurysm. The celiac, superior mesenteric, inferior mesenteric, iliac and common femoral arteries are patent. 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 Whipple procedure has been performed. Pancreatic head and gallbladder have been resected. Air is present in the nondependent portion of the biliary tree. The liver is of mild low attenuation from fatty infiltration. Small stable hypodensities are noted in the liver. The largest measures 1 cm in the right lobe Spleen and adrenal glands are unremarkable. Bilateral renal cortical atrophy is present with cysts. Pelvic viscera is unremarkable. Penile implant is noted. Diverticulosis is present in the colon without diverticulitis. No bowel obstruction, free intraperitoneal air, free fluid or appendicitis. Degenerative changes are present throughout the spine. Laminectomy and fusion is been performed at L5-S1. All CT exams at this facility use one or more dose reduction techniques including automatic exposure control, mA/kV adjustment per patient's size, or iterative reconstruction technique. Electronically signed by: Nelda Severe, MD 05/18/2022 3:51 PM EDT          Workstation ID: [id number]     CTA ABDOMEN PELVIS W WO CONTRAST    Result Date: 05/16/2022  DICOM format image data is available to non-affiliated external healthcare facilities or entities on a secure, media free, reciprocally searchable basis with patient authorization for 12 months following the date of the study. Clinical History:rectal bleeding.  Examination: CTA scan of the abdomen and pelvis without and with contrast 05/16/2022 10:22 PM EDT.  5 mm noncontrast spiral scanning was performed from the lung bases to the symphysis pubis. 3 mm spiral scanning is  performed with intravenous contrast in the arterial phase from the lung bases to the symphysis pubis. 3 D MIP, coronal and sagittal reconstruction images has been obtained. Correlation: 01/25/2022 FINDINGS: Lung bases are clear. Atherosclerotic calcification of the coronary arteries, abdominal aorta and mesenteric arteries. Degenerative changes of the spine. Penile implant. Pneumobilia likely related to sphincterotomy. Gallbladder is absent. Multiple small hepatic and renal hypodensities are too small to characterize. There are too numerous to characterize renal hypodensities. Cyst with wall calcification lower pole left kidney. Spleen, pancreatic tail is unremarkable, status post Whipple's. Adrenal glands and bladder are unremarkable. Prostate is enlarged. There are colonic diverticuli. Terminal ileum, appendix, stomach and small bowel loops are within normal limits. Dense atherosclerotic calcification of the abdominal aorta, mesenteric, renal and iliac arteries. No lymph node enlargement or free fluid. Nonobstructing 4 mm left  nephrolithiasis.     IMPRESSION: 1. No active GI bleed. 2. Diverticulosis. 3. Status post Whipple's procedure. 4. Multiple hepatic and renal hypodensities some small and too numerous to characterize. 5. 4 mm nonobstructing left nephrolithiasis. Electronically signed by: Kathlene Cote, MD 05/16/2022 10:34 PM EDT          Workstation ID: [id number]        ---------------------------------------------------------------------------------------------------------------  I have independently examined the patient and reviewed all lab studies and imgaing as well as review of nursing notes and physican notes from the past 24 hours. The plan of care has been discussed with the patient/relative and all questions are answered.     Dragon medical dictation software was used for portions of this report. Unintended errors may occur.     Wylene Men, APRN - NP  05/20/2022    [place]

## 2022-05-20 NOTE — Progress Notes (Signed)
INTERNAL MEDICINE PROGRESS NOTE    Date of note:      May 20, 2022    Patient:               Tommy Brown, 77 y.o., male  Admit Date:        05/16/2022  Length of Stay:  4 day(s)    Problem List:   Patient Active Problem List   Diagnosis    UTI (urinary tract infection)    History of syncope    AKI (acute kidney injury) ([place])    Hypertensive retinopathy, bilateral    Hx of CABG    Intraductal papillary mucinous neoplasm    Vitreous degeneration, left eye    Clostridium difficile diarrhea    Syncope    Atherosclerosis of coronary artery    Chest pain    H/O neck surgery    Palpitations    Bacteremia due to Klebsiella pneumoniae    Essential hypertension    Coronary artery disease (CAD) excluded    Pneumonia    Testosterone deficiency    Cervical disc disease    Port-A-Cath in place    AR (allergic rhinitis)    Acute chest pain    Metabolic acidosis    Murmur, cardiac    Sepsis ([place])    Liver cyst    BPH (benign prostatic hyperplasia)    Carotid artery disease (HCC)    Seasonal allergies    Malignant neoplasm of head of pancreas (HCC)    Hypercholesteremia    Puckering of macula, right eye    Tributary (branch) retinal vein occlusion, right eye, stable    Dyslipidemia    Ejection fraction < 50%    Diarrhea    Recurrent nephrolithiasis    Preop cardiovascular exam    Gout    Steatorrhea    Pneumonia due to COVID-19 virus    History of coronary artery disease    Rectal bleeding    Lower GI bleed       Subjective + interval history     Had total 3 bowel movements yesterday, last one had blood  No BM this morning. Denies abdominal pain     Assessment :      Acute painless hematochezia - suspect diverticular bleeding  Acute on chronic anemia due to GI bleeding  Recent hospitalization from 05/09/22 to 05/12/22 for lower GI bleeding  CKD III A  CAD s/p CABG  Hypertension  dyslipidemia  Pancreatic cancer s/p Whipple's procedure  BPH  Strep viridans blood stream infection  Moderate aortic stenosis     Plan :          Patient was recently discharged on 05/12/22 - presented to the ER on 3/21 for rectal bleeding   CTA  abdomen 05/16/22  - no active GI bleed.  + diverticulosis  Hemoglobin went down to 6.2 - s/p 3 units PRBC transfusion   CTA repeated on 3/23 for bleeding - no source or active GI bleed was found     Hemoglobin is stable 9.6  Continue PPI  GI input appreciated. Noted plan to consider capsule endoscopy if he continues to bleed.  If brisk bleeding with drop in H/H stat CTA abd/pelvis vs nuclear RBC bleeding scan       S/p EGD 05/17/22 - normal  Flex sigmoidoscopy 05/17/22 - Large amount of old blood and unable to adequately visualize the colonic mucosa.  Scope not advanced beyond the sigmoid colon at 20cm.  No active bleeding  seen     Blood cultures from 05/16/22 are positive for strep viridans.- could be translocation from GI tract  Continue IV Vancomycin  Blood cultures from 3/24 - in progress  ECHO 05/20/22 with EF 49%, moderate aortic stenosis, AVA 0.54 cm 2, no significant change since 06/2019  ID input appreciated     Updated patient's wife at bedside  Continue hospitalization to make sure there is no further GI bleeding    Objective:     Visit Vitals  BP 116/61   Pulse 74   Temp 97.3 F (36.3 C) (Temporal)   Resp 18   Ht 1.676 m (5\' 6" )   Wt 58.6 kg (129 lb 3 oz)   SpO2 98%   BMI 20.85 kg/m                Intake/Output Summary (Last 24 hours) at 05/20/2022 1412  Last data filed at 05/19/2022 2320  Gross per 24 hour   Intake 310 ml   Output --   Net 310 ml       Physical Exam:     GEN - AAOx3, NAD  HEENT - mucous membranes moist  Neck - supple, no JVD  Cardiac - RRR, S1, S2, 2/6 systolic murmur  Chest/Lungs - clear to auscultation without wheezes or rhonchi  Abdomen - soft, nontender,   Extremities - no clubbing/ cyanosis/ edema  Neuro - follows commands, ambulating    Current medications:       Current Facility-Administered Medications:     *Pharmacy to dose vancomycin, 1 each, Other, RX Placeholder, Leatrice Parilla,  MD    vancomycin (VANCOCIN) 1000 mg in sodium chloride 0.9% 250 mL IVPB, 1,000 mg, IntraVENous, Q24H, Terease Marcotte, MD, Stopped at 05/20/22 1204    [START ON 05/22/2022] Vancomycin Trough Reminder, 1 each, Other, Once, Kianni Lheureux, MD    gabapentin (NEURONTIN) capsule 300 mg, 300 mg, Oral, TID, Inette Doubrava, MD, 300 mg at 05/20/22 0847    acetaminophen (TYLENOL) tablet 650 mg, 650 mg, Oral, Q6H PRN, Matriano, Crista Curb, MD, 650 mg at 05/18/22 2320    0.9 % sodium chloride infusion, , IntraVENous, PRN, Levasseur, Isabel Caprice, MD    pantoprazole (PROTONIX) 40 mg in sodium chloride (PF) 0.9 % 10 mL injection, 40 mg, IntraVENous, Q12H, Matriano, Crista Curb, MD, 40 mg at 05/20/22 1039    naloxone (NARCAN) injection 0.4 mg, 0.4 mg, IntraVENous, PRN, Matriano, Crista Curb, MD    ondansetron [facility]) injection 4 mg, 4 mg, IntraVENous, Q6H PRN, Matriano, Crista Curb, MD, 4 mg at 05/17/22 1020    melatonin tablet 3 mg, 3 mg, Oral, Nightly PRN, Matriano, Crista Curb, MD    allopurinol (ZYLOPRIM) tablet 300 mg, 300 mg, Oral, Daily, Matriano, Crista Curb, MD, 300 mg at 05/20/22 0848    lipase-protease-amylase (CREON) delayed release capsule 48,000 Units, 48,000 Units, Oral, TID WC, Matriano, Crista Curb, MD, 48,000 Units at 05/20/22 1315    pravastatin (PRAVACHOL) tablet 10 mg, 10 mg, Oral, Daily, Matriano, Crista Curb, MD, 10 mg at 05/19/22 1024    tamsulosin (FLOMAX) capsule 0.8 mg, 0.8 mg, Oral, Daily, Matriano, Crista Curb, MD, 0.8 mg at 05/20/22 0847    temazepam (RESTORIL) capsule 15 mg, 15 mg, Oral, Nightly PRN, Matriano, Crista Curb, MD, 15 mg at 05/19/22 2338    methocarbamol (ROBAXIN) tablet 500 mg, 500 mg, Oral, Daily PRN, Matriano, Crista Curb, MD    Labs:  Recent Results (from the past 24 hour(s))   CBC with Auto Differential    Collection Time: 05/20/22  6:53 AM   Result Value Ref Range    WBC 4.6 4.0 - 11.0 1000/mm3    RBC 3.03 (L) 3.80 - 5.70 M/uL    Hemoglobin 9.6 (L) 13.0 - 18.0 gm/dl    Hematocrit  29.2 (L) 36.0 - 55.0 %    MCV 96.4 80.0 - 98.0 fL    MCH 31.7 23.0 - 34.6 pg    MCHC 32.9 30.0 - 36.0 gm/dl    Platelets 122 (L) 140 - 450 1000/mm3    MPV 10.6 (H) 6.0 - 10.0 fL    RDW 57.6 (H) 35.1 - 43.9      Nucleated RBCs 0 0 - 0      Immature Granulocytes 0.9 0.0 - 3.0 %    Neutrophils Segmented 63.1 34 - 64 %    Lymphocytes 23.9 (L) 28 - 48 %    Monocytes 8.6 1 - 13 %    Eosinophils 3.3 0 - 5 %    Basophils 0.2 0 - 3 %   Basic Metabolic Panel    Collection Time: 05/20/22  6:53 AM   Result Value Ref Range    Potassium 4.3 3.5 - 5.1 mEq/L    Chloride 111 (H) 98 - 107 mEq/L    Sodium 140 136 - 145 mEq/L    CO2 24 20 - 31 mEq/L    Glucose 99 74 - 106 mg/dl    BUN 15 9 - 23 mg/dl    Creatinine 1.17 0.70 - 1.30 mg/dl    GFR African American >60      GFR Non-African American >60      Calcium 8.3 (L) 8.7 - 10.4 mg/dl    Anion Gap 5 5 - 15 mmol/L       RAD Results:  CTA ABDOMEN PELVIS W WO CONTRAST    Result Date: 05/18/2022  Indication: GI Bleeding. History of diverticulosis. Whipple procedure.Marland Kitchen     IMPRESSION: No active gastrointestinal bleeding. Severe diffuse atherosclerosis. Whipple procedure. Stable hepatic hypodensities. Bilateral renal cortical atrophy. Diverticulosis. Penile implant. Diffuse spinal degenerative change. Comment: CT images of the abdomen and pelvis were obtained prior to and following the administration of intravenous contrast. MIPS projections were obtained. This was compared with May 16, 2022 and January 25, 2022.Marland Kitchen No active gastrointestinal bleeding. The lung bases are hyperaerated. Coronary calcification is observed. Atherosclerotic changes are present throughout. No aneurysm. The celiac, superior mesenteric, inferior mesenteric, iliac and common femoral arteries are patent. 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 Whipple procedure has been performed. Pancreatic head and gallbladder have been resected. Air is present in the nondependent portion  of the biliary tree. The liver is of mild low attenuation from fatty infiltration. Small stable hypodensities are noted in the liver. The largest measures 1 cm in the right lobe Spleen and adrenal glands are unremarkable. Bilateral renal cortical atrophy is present with cysts. Pelvic viscera is unremarkable. Penile implant is noted. Diverticulosis is present in the colon without diverticulitis. No bowel obstruction, free intraperitoneal air, free fluid or appendicitis. Degenerative changes are present throughout the spine. Laminectomy and fusion is been performed at L5-S1. All CT exams at this facility use one or more dose reduction techniques including automatic exposure control, mA/kV adjustment per patient's size, or iterative reconstruction technique. Electronically signed by: Nelda Severe, MD 05/18/2022 3:51 PM EDT  Workstation ID: [id number]     CTA ABDOMEN PELVIS W WO CONTRAST    Result Date: 05/16/2022  DICOM format image data is available to non-affiliated external healthcare facilities or entities on a secure, media free, reciprocally searchable basis with patient authorization for 12 months following the date of the study. Clinical History:rectal bleeding.  Examination: CTA scan of the abdomen and pelvis without and with contrast 05/16/2022 10:22 PM EDT.  5 mm noncontrast spiral scanning was performed from the lung bases to the symphysis pubis. 3 mm spiral scanning is performed with intravenous contrast in the arterial phase from the lung bases to the symphysis pubis. 3 D MIP, coronal and sagittal reconstruction images has been obtained. Correlation: 01/25/2022 FINDINGS: Lung bases are clear. Atherosclerotic calcification of the coronary arteries, abdominal aorta and mesenteric arteries. Degenerative changes of the spine. Penile implant. Pneumobilia likely related to sphincterotomy. Gallbladder is absent. Multiple small hepatic and renal hypodensities are too small to characterize. There are too  numerous to characterize renal hypodensities. Cyst with wall calcification lower pole left kidney. Spleen, pancreatic tail is unremarkable, status post Whipple's. Adrenal glands and bladder are unremarkable. Prostate is enlarged. There are colonic diverticuli. Terminal ileum, appendix, stomach and small bowel loops are within normal limits. Dense atherosclerotic calcification of the abdominal aorta, mesenteric, renal and iliac arteries. No lymph node enlargement or free fluid. Nonobstructing 4 mm left nephrolithiasis.     IMPRESSION: 1. No active GI bleed. 2. Diverticulosis. 3. Status post Whipple's procedure. 4. Multiple hepatic and renal hypodensities some small and too numerous to characterize. 5. 4 mm nonobstructing left nephrolithiasis. Electronically signed by: Kathlene Cote, MD 05/16/2022 10:34 PM EDT          Workstation ID: [id number]         Total clinical care time was 39  minutes of which more than 50% was spent in coordination of care and counseling (time spent with patient/family face to face, physical exam, reviewing laboratory and imaging investigations, speaking with physicians and nursing staff involved in this patient's care).     Dragon medical dictation system was used for part of this note. Unintentional voice recognition errors may occur    Perfecto Kingdom,  MD  [facility]  May 20, 2022  Time: 2:12 PM

## 2022-05-20 NOTE — Progress Notes (Signed)
Echo completed

## 2022-05-20 NOTE — Progress Notes (Signed)
GI Progress Note    Patient:  Tommy Brown  Date of Birth:  [birth year]  Age: 77 y.o.  Sex: male  PCP: Daralene Milch, MD  MRN: [id number]                    Admit Date: 05/16/2022    Assessment:   GI bleed.  Painless hematochezia.  Likely diverticular bleed.  CTA neg for evidence of active bleeding. EGD 05/17/22 normal. Colonoscopy 2022 with diverticulosis.  CTA abd/pelvis 05/18/22 with no active GI bleeding.  Acute anemia secondary to #1.  Hgb 9.6 this AM.  Dysphagia with solids  Pancreatic cancer s/p whipple  AKI  HTN/HLD  CAD s/p CABG, daily ASA  BPH  Chronic neuropathy  Gout       Active Hospital Problems    Diagnosis Date Noted    Rectal bleeding [K62.5] 05/09/2022     Plan:   Continue PPI therapy   Continue to monitor H/H and transfuse for HGB less than 7.   One episode of bleeding last night. None this am  Continue diet as tolerated  May consider capsule endoscopy if continues bleeding and drop in H/H.   If brisk bleeding with drop in H/H stat CTA abd/pelvis vs nuclear RBC bleeding scan    Subjective:   Pt resting in bed. Wife at bedside. Pt denies any abd pain. Pt had three BM yesterday with one having bright red blood. No further bleeding this am.       Objective:   Physical Exam:  BP 105/64   Pulse 77   Temp 96.8 F (36 C) (Temporal)   Resp 18   Ht 1.676 m (5\' 6" )   Wt 58.6 kg (129 lb 3 oz)   SpO2 97%   BMI 20.85 kg/m    Temp (24hrs), Avg:97.4 F (36.3 C), Min:96.8 F (36 C), Max:97.9 F (36.6 C)      Intake/Output Summary (Last 24 hours) at 05/20/2022 0547  Last data filed at 05/19/2022 2320  Gross per 24 hour   Intake 910 ml   Output 750 ml   Net 160 ml     Current Facility-Administered Medications   Medication Dose Route Frequency Provider Last Rate Last Admin    *Pharmacy to dose vancomycin  1 each Other RX Placeholder Simoes, Ruchita, MD        vancomycin (VANCOCIN) 1000 mg in sodium chloride 0.9% 250 mL IVPB  1,000 mg IntraVENous Q24H Simoes, Ruchita, MD        [START ON  05/22/2022] Vancomycin Trough Reminder  1 each Other Once Simoes, Ruchita, MD        gabapentin (NEURONTIN) capsule 300 mg  300 mg Oral TID Simoes, Ruchita, MD   300 mg at 05/19/22 2030    acetaminophen (TYLENOL) tablet 650 mg  650 mg Oral Q6H PRN Illene Regulus, MD   650 mg at 05/18/22 2320    0.9 % sodium chloride infusion   IntraVENous PRN Levasseur, Isabel Caprice, MD        pantoprazole (PROTONIX) 40 mg in sodium chloride (PF) 0.9 % 10 mL injection  40 mg IntraVENous Q12H Matriano, Crista Curb, MD   40 mg at 05/19/22 2234    naloxone [facility]) injection 0.4 mg  0.4 mg IntraVENous PRN Illene Regulus, MD        ondansetron [facility]) injection 4 mg  4 mg IntraVENous Q6H PRN Matriano, Crista Curb, MD   4 mg  at 05/17/22 1020    melatonin tablet 3 mg  3 mg Oral Nightly PRN Matriano, Crista Curb, MD        allopurinol (ZYLOPRIM) tablet 300 mg  300 mg Oral Daily Matriano, Crista Curb, MD   300 mg at 05/19/22 1024    lipase-protease-amylase (CREON) delayed release capsule 48,000 Units  48,000 Units Oral TID WC Matriano, Crista Curb, MD   48,000 Units at 05/19/22 1640    pravastatin (PRAVACHOL) tablet 10 mg  10 mg Oral Daily Matriano, Crista Curb, MD   10 mg at 05/19/22 1024    tamsulosin (FLOMAX) capsule 0.8 mg  0.8 mg Oral Daily Illene Regulus, MD   0.8 mg at 05/19/22 1024    temazepam (RESTORIL) capsule 15 mg  15 mg Oral Nightly PRN Illene Regulus, MD   15 mg at 05/19/22 2338    methocarbamol (ROBAXIN) tablet 500 mg  500 mg Oral Daily PRN Matriano, Crista Curb, MD            Physical Exam:  HEENT: NC/AT, PERRLA, Oral Mucosa pink moist intact, Eyes, ears grossly normal  Neck: Supple, trachea midline, voice clear   CV: S1S2 noted, No R/G.   Resp: Symmetrical chest expansion, Bilateral breath sounds clear without wheeze   GI: Soft NT/ND, Active x4 quad   Ext: No edema, Brisk cap refill/+pulses    Neuro: A/Ox3, Normal affect and mood. No gross neurologic abnormality noted.     DATA    CBC w/Diff   Lab  Results   Component Value Date/Time    WBC 4.6 05/19/2022 02:20 AM    WBC 5.7 05/18/2022 04:30 AM    WBC 9.5 05/16/2022 06:30 PM    RBC 2.93 (L) 05/19/2022 02:20 AM    RBC 3.19 (L) 05/18/2022 04:30 AM    RBC 2.54 (L) 05/16/2022 06:30 PM    HGB 9.3 (L) 05/19/2022 02:20 AM    HGB 8.8 (L) 05/18/2022 03:48 PM    HGB 9.6 (L) 05/18/2022 04:30 AM    HCT 28.1 (L) 05/19/2022 02:20 AM    HCT 27.1 (L) 05/18/2022 03:48 PM    HCT 29.3 (L) 05/18/2022 04:30 AM    MCV 95.9 05/19/2022 02:20 AM    MCV 91.8 05/18/2022 04:30 AM    MCV 100.0 (H) 05/16/2022 06:30 PM    MCH 31.7 05/19/2022 02:20 AM    MCH 30.1 05/18/2022 04:30 AM    MCH 32.3 05/16/2022 06:30 PM    MCHC 33.1 05/19/2022 02:20 AM    MCHC 32.8 05/18/2022 04:30 AM    MCHC 32.3 05/16/2022 06:30 PM    RDW 58.5 (H) 05/19/2022 02:20 AM    RDW 57.1 (H) 05/18/2022 04:30 AM    RDW 56.2 (H) 05/16/2022 06:30 PM    PLT 117 (L) 05/19/2022 02:20 AM    PLT 109 (L) 05/18/2022 04:30 AM    PLT 183 05/16/2022 06:30 PM    MPV 11.1 (H) 05/19/2022 02:20 AM    MPV 10.9 (H) 05/18/2022 04:30 AM    MPV 11.1 (H) 05/16/2022 06:30 PM     Lab Results   Component Value Date/Time    MONOS 8.7 05/19/2022 02:20 AM    MONOS 8.6 05/18/2022 04:30 AM    MONOS 8.8 05/16/2022 06:30 PM        Recent Labs     05/17/22  0641 05/17/22  1226 05/18/22  0430 05/19/22  0220   NA 138 137 139 140  K 4.9 4.7 4.7 4.4   CL 109* 107 112* 110*   CO2 22  --  24 24   BUN 35* 30* 21 13   CREATININE 1.54* 1.5* 1.24 1.20   GLUCOSE 141* 127* 102 99   CALCIUM 8.1*  --  8.2* 8.4*   LABALBU 2.5*  --   --   --    BILITOT 1.10  --   --   --    ALKPHOS 74  --   --   --    AST 26.0  --   --   --    ALT 35  --   --   --      Hepatic Function   Recent Labs     05/17/22  0641   ALT 35   AST 26.0   TP 4.8*        Recent Labs     05/17/22  0641   ALKPHOS 74   ALT 35   AST 26.0   BILITOT 1.10   LABALBU 2.5*     Pancreatic Enzymes   No results for input(s): "AML" in the last 72 hours.    Invalid input(s): "LPSE"   No results for input(s): "LIPASE"  in the last 72 hours.  Basic Metabolic Profile   Recent Labs     05/17/22  0641 05/17/22  1226 05/18/22  0430 05/19/22  0220   NA 138 137 139 140   K 4.9 4.7 4.7 4.4   CL 109* 107 112* 110*   CO2 22  --  24 24   BUN 35* 30* 21 13        Lab Results   Component Value Date/Time    LABALBU 2.5 05/17/2022 06:41 AM     POC Glucose  No components found for: "GLPOC"    Coagulation   No results for input(s): "INR", "PROTIME" in the last 72 hours.          Radiology Results  CTA ABDOMEN PELVIS W WO CONTRAST    Result Date: 05/16/2022  DICOM format image data is available to non-affiliated external healthcare facilities or entities on a secure, media free, reciprocally searchable basis with patient authorization for 12 months following the date of the study. Clinical History:rectal bleeding.  Examination: CTA scan of the abdomen and pelvis without and with contrast 05/16/2022 10:22 PM EDT.  5 mm noncontrast spiral scanning was performed from the lung bases to the symphysis pubis. 3 mm spiral scanning is performed with intravenous contrast in the arterial phase from the lung bases to the symphysis pubis. 3 D MIP, coronal and sagittal reconstruction images has been obtained. Correlation: 01/25/2022 FINDINGS: Lung bases are clear. Atherosclerotic calcification of the coronary arteries, abdominal aorta and mesenteric arteries. Degenerative changes of the spine. Penile implant. Pneumobilia likely related to sphincterotomy. Gallbladder is absent. Multiple small hepatic and renal hypodensities are too small to characterize. There are too numerous to characterize renal hypodensities. Cyst with wall calcification lower pole left kidney. Spleen, pancreatic tail is unremarkable, status post Whipple's. Adrenal glands and bladder are unremarkable. Prostate is enlarged. There are colonic diverticuli. Terminal ileum, appendix, stomach and small bowel loops are within normal limits. Dense atherosclerotic calcification of the abdominal aorta,  mesenteric, renal and iliac arteries. No lymph node enlargement or free fluid. Nonobstructing 4 mm left nephrolithiasis.     IMPRESSION: 1. No active GI bleed. 2. Diverticulosis. 3. Status post Whipple's procedure. 4. Multiple hepatic and renal hypodensities some small and  too numerous to characterize. 5. 4 mm nonobstructing left nephrolithiasis. Electronically signed by: Kathlene Cote, MD 05/16/2022 10:34 PM EDT          Workstation ID: [email or web address], NP-C  [facility]  Cell # [phone]  Office # [phone]  [address], [place], [place]

## 2022-05-20 NOTE — Progress Notes (Signed)
Bedside shift change report given to Roshan Salamon Nena D Zaylan Kissoon, RN   (oncoming nurse) by Adrienne Rn (offgoing nurse). Report included the following information Intake/Output and MAR.

## 2022-05-21 LAB — BASIC METABOLIC PANEL
Anion Gap: 7 mmol/L (ref 5–15)
BUN: 15 mg/dl (ref 9–23)
CO2: 23 mEq/L (ref 20–31)
Calcium: 8.6 mg/dl — ABNORMAL LOW (ref 8.7–10.4)
Chloride: 109 mEq/L — ABNORMAL HIGH (ref 98–107)
Creatinine: 1.16 mg/dl (ref 0.70–1.30)
GFR African American: >60
GFR Non-African American: >60
Glucose: 90 mg/dl (ref 74–106)
Potassium: 4.5 mEq/L (ref 3.5–5.1)
Sodium: 139 mEq/L (ref 136–145)

## 2022-05-21 LAB — CBC WITH AUTO DIFFERENTIAL
Basophils: 0.2 % (ref 0–3)
Eosinophils: 2.3 % (ref 0–5)
Hematocrit: 30.2 % — ABNORMAL LOW (ref 36.0–55.0)
Hemoglobin: 9.7 gm/dl — ABNORMAL LOW (ref 13.0–18.0)
Immature Granulocytes: 0.6 % (ref 0.0–3.0)
Lymphocytes: 15.7 % — ABNORMAL LOW (ref 28–48)
MCH: 30.8 pg (ref 23.0–34.6)
MCHC: 32.1 gm/dl (ref 30.0–36.0)
MCV: 95.9 fL (ref 80.0–98.0)
MPV: 10.8 fL — ABNORMAL HIGH (ref 6.0–10.0)
Monocytes: 8.3 % (ref 1–13)
Neutrophils Segmented: 72.9 % — ABNORMAL HIGH (ref 34–64)
Nucleated RBCs: 0 (ref 0–0)
Platelets: 132 10*3/uL — ABNORMAL LOW (ref 140–450)
RBC: 3.15 M/uL — ABNORMAL LOW (ref 3.80–5.70)
RDW: 59.2 — ABNORMAL HIGH (ref 35.1–43.9)
WBC: 6.2 10*3/uL (ref 4.0–11.0)

## 2022-05-21 MED ORDER — GABAPENTIN 300 MG PO CAPS
300 | Freq: Three times a day (TID) | ORAL | Status: DC
Start: 2022-05-21 — End: 2022-05-21

## 2022-05-21 MED ORDER — TRAMADOL HCL 50 MG PO TABS
50 MG | Freq: Four times a day (QID) | ORAL | Status: DC | PRN
Start: 2022-05-21 — End: 2022-05-23
  Administered 2022-05-21: 07:00:00 50 mg via ORAL

## 2022-05-21 MED ORDER — GABAPENTIN 300 MG PO CAPS
300 MG | Freq: Three times a day (TID) | ORAL | Status: DC
Start: 2022-05-21 — End: 2022-05-22
  Administered 2022-05-21 – 2022-05-22 (×3): 600 mg via ORAL

## 2022-05-21 MED ORDER — HYDROCORTISONE ACETATE 25 MG RE SUPP
25 MG | Freq: Two times a day (BID) | RECTAL | Status: DC
Start: 2022-05-21 — End: 2022-05-23
  Administered 2022-05-22 – 2022-05-23 (×3): 25 mg via RECTAL

## 2022-05-21 MED FILL — ACETAMINOPHEN 325 MG PO TABS: 325 MG | ORAL | Qty: 2

## 2022-05-21 MED FILL — TEMAZEPAM 7.5 MG PO CAPS: 7.5 MG | ORAL | Qty: 2

## 2022-05-21 MED FILL — GABAPENTIN 300 MG PO CAPS: 300 MG | ORAL | Qty: 1

## 2022-05-21 MED FILL — CREON 6000-19000 UNITS PO CPEP: 6000-19000 units | ORAL | Qty: 2

## 2022-05-21 MED FILL — ONDANSETRON HCL 4 MG/2ML IJ SOLN: 4 MG/2ML | INTRAMUSCULAR | Qty: 2

## 2022-05-21 MED FILL — TRAMADOL HCL 50 MG PO TABS: 50 MG | ORAL | Qty: 1

## 2022-05-21 MED FILL — METHOCARBAMOL 500 MG PO TABS: 500 MG | ORAL | Qty: 1

## 2022-05-21 MED FILL — PANTOPRAZOLE SODIUM 40 MG IV SOLR: 40 MG | INTRAVENOUS | Qty: 40

## 2022-05-21 MED FILL — ALLOPURINOL 300 MG PO TABS: 300 MG | ORAL | Qty: 1

## 2022-05-21 MED FILL — VANCOMYCIN (VANCOCIN) 1000 MG IN SODIUM CHLORIDE 0.9% 250 ML IVPB: Qty: 250

## 2022-05-21 MED FILL — PRAVASTATIN SODIUM 10 MG PO TABS: 10 MG | ORAL | Qty: 1

## 2022-05-21 MED FILL — TAMSULOSIN HCL 0.4 MG PO CAPS: 0.4 MG | ORAL | Qty: 2

## 2022-05-21 MED FILL — GABAPENTIN 300 MG PO CAPS: 300 MG | ORAL | Qty: 2

## 2022-05-21 NOTE — Progress Notes (Signed)
INTERNAL MEDICINE PROGRESS NOTE    Date of note:      May 21, 2022    Patient:               Tommy Brown, 77 y.o., male  Admit Date:        05/16/2022  Length of Stay:  5 day(s)    Problem List:   Patient Active Problem List   Diagnosis    UTI (urinary tract infection)    History of syncope    AKI (acute kidney injury) ([place])    Hypertensive retinopathy, bilateral    Hx of CABG    Intraductal papillary mucinous neoplasm    Vitreous degeneration, left eye    Clostridium difficile diarrhea    Syncope    Atherosclerosis of coronary artery    Chest pain    H/O neck surgery    Palpitations    Bacteremia due to Klebsiella pneumoniae    Essential hypertension    Coronary artery disease (CAD) excluded    Pneumonia    Testosterone deficiency    Cervical disc disease    Port-A-Cath in place    AR (allergic rhinitis)    Acute chest pain    Metabolic acidosis    Murmur, cardiac    Sepsis ([place])    Liver cyst    BPH (benign prostatic hyperplasia)    Carotid artery disease (HCC)    Seasonal allergies    Malignant neoplasm of head of pancreas (HCC)    Hypercholesteremia    Puckering of macula, right eye    Tributary (branch) retinal vein occlusion, right eye, stable    Dyslipidemia    Ejection fraction < 50%    Diarrhea    Recurrent nephrolithiasis    Preop cardiovascular exam    Gout    Steatorrhea    Pneumonia due to COVID-19 virus    History of coronary artery disease    Rectal bleeding    Lower GI bleed       Subjective + interval history     No bowel  movement in the past 24 hrs. Last BM was on 3/24 night.   Complains of neck pain ( which is chronic ) - takes gabapentin 2400 mg ( 600 mg 4 times a day)    Assessment :     Acute painless hematochezia - suspect diverticular bleeding  Acute on chronic anemia due to GI bleeding  Recent hospitalization from 05/09/22 to 05/12/22 for lower GI bleeding  CKD III A  CAD s/p CABG  Hypertension  dyslipidemia  Pancreatic cancer s/p Whipple's procedure  BPH  Strep viridans blood  stream infection  Moderate aortic stenosis       Plan :     Patient was recently discharged on 05/12/22 - presented to the ER on 3/21 for rectal bleeding   CTA  abdomen 05/16/22  - no active GI bleed.  + diverticulosis  Hemoglobin went down to 6.2 - s/p 3 units PRBC transfusion   CTA repeated on 3/23 for bleeding - no source or active GI bleed was found     Hemoglobin is stable 9.7  Continue PPI  GI input appreciated. Noted plan to consider capsule endoscopy if he continues to bleed.  If brisk bleeding with drop in H/H stat CTA abd/pelvis vs nuclear RBC bleeding scan  No bleeding issues currently. GI recommends outpatient follow        S/p EGD 05/17/22 - normal  Flex sigmoidoscopy 05/17/22 - Large  amount of old blood and unable to adequately visualize the colonic mucosa.  Scope not advanced beyond the sigmoid colon at 20cm.  No active bleeding seen     Blood cultures from 05/16/22 are positive for strep viridans and alpha hemolytic GPC ->  could be translocation from GI tract or contaminant  Continue IV Vancomycin  Blood cultures from 3/24 - neg to date  ECHO 05/20/22 with EF 49%, moderate aortic stenosis, AVA 0.54 cm 2, no significant change since 06/2019  ID input appreciated - discussed with Dr. Kellie Moor. Await finalization of blood cultures.     Gabapentin dose increased to home dose. Discussed with pharmacist.    Discharge home tomorrow if no bleeding overnight and blood cultures are finalized    Objective:     Visit Vitals  BP (!) 141/71   Pulse 71   Temp 98.1 F (36.7 C) (Temporal)   Resp 19   Ht 1.676 m (5\' 6" )   Wt 58.6 kg (129 lb 3 oz)   SpO2 98%   BMI 20.85 kg/m                Intake/Output Summary (Last 24 hours) at 05/21/2022 0931  Last data filed at 05/21/2022 [id number]  Gross per 24 hour   Intake 340 ml   Output --   Net 340 ml       Physical Exam:     GEN - AAOx3, NAD  HEENT - mucous membranes moist  Neck - supple, no JVD  Cardiac - RRR, S1, S2, 2/6 systolic murmur  Chest/Lungs - clear to auscultation without  wheezes or rhonchi  Abdomen - soft, nontender,   Extremities - no clubbing/ cyanosis/ edema  Neuro - follows commands, ambulating    Current medications:       Current Facility-Administered Medications:     traMADol (ULTRAM) tablet 50 mg, 50 mg, Oral, Q6H PRN, Knox Saliva, MD, 50 mg at 05/21/22 0232    hydrocortisone (ANUSOL-HC) suppository 25 mg, 25 mg, Rectal, BID PRN, Doscher, Vickie L, APRN - NP    *Pharmacy to dose vancomycin, 1 each, Other, RX Placeholder, Jorel Gravlin, MD    vancomycin (VANCOCIN) 1000 mg in sodium chloride 0.9% 250 mL IVPB, 1,000 mg, IntraVENous, Q24H, Naiara Lombardozzi, MD, Stopped at 05/20/22 1204    [START ON 05/22/2022] Vancomycin Trough Reminder, 1 each, Other, Once, Belita Warsame, MD    gabapentin (NEURONTIN) capsule 300 mg, 300 mg, Oral, TID, Jerrell Mangel, MD, 300 mg at 05/21/22 0913    acetaminophen (TYLENOL) tablet 650 mg, 650 mg, Oral, Q6H PRN, Matriano, Crista Curb, MD, 650 mg at 05/20/22 2328    0.9 % sodium chloride infusion, , IntraVENous, PRN, Levasseur, Isabel Caprice, MD    pantoprazole (PROTONIX) 40 mg in sodium chloride (PF) 0.9 % 10 mL injection, 40 mg, IntraVENous, Q12H, Matriano, Crista Curb, MD, 40 mg at 05/20/22 2329    naloxone [facility]) injection 0.4 mg, 0.4 mg, IntraVENous, PRN, Matriano, Crista Curb, MD    ondansetron [facility]) injection 4 mg, 4 mg, IntraVENous, Q6H PRN, Matriano, Crista Curb, MD, 4 mg at 05/21/22 [id number]    melatonin tablet 3 mg, 3 mg, Oral, Nightly PRN, Matriano, Crista Curb, MD    allopurinol (ZYLOPRIM) tablet 300 mg, 300 mg, Oral, Daily, Matriano, Crista Curb, MD, 300 mg at 05/21/22 0912    lipase-protease-amylase (CREON) delayed release capsule 48,000 Units, 48,000 Units, Oral, TID WC, Matriano, Crista Curb, MD, 48,000 Units at  05/21/22 0912    pravastatin (PRAVACHOL) tablet 10 mg, 10 mg, Oral, Daily, Matriano, Crista Curb, MD, 10 mg at 05/21/22 0912    tamsulosin (FLOMAX) capsule 0.8 mg, 0.8 mg, Oral, Daily, Matriano, Crista Curb, MD, 0.8 mg  at 05/21/22 0913    temazepam (RESTORIL) capsule 15 mg, 15 mg, Oral, Nightly PRN, Matriano, Crista Curb, MD, 15 mg at 05/20/22 2236    methocarbamol (ROBAXIN) tablet 500 mg, 500 mg, Oral, Daily PRN, Clydene Fake, Crista Curb, MD, 500 mg at 05/21/22 0913    Labs:     Recent Results (from the past 24 hour(s))   CBC with Auto Differential    Collection Time: 05/21/22  6:52 AM   Result Value Ref Range    WBC 6.2 4.0 - 11.0 1000/mm3    RBC 3.15 (L) 3.80 - 5.70 M/uL    Hemoglobin 9.7 (L) 13.0 - 18.0 gm/dl    Hematocrit 30.2 (L) 36.0 - 55.0 %    MCV 95.9 80.0 - 98.0 fL    MCH 30.8 23.0 - 34.6 pg    MCHC 32.1 30.0 - 36.0 gm/dl    Platelets 132 (L) 140 - 450 1000/mm3    MPV 10.8 (H) 6.0 - 10.0 fL    RDW 59.2 (H) 35.1 - 43.9      Nucleated RBCs 0 0 - 0      Immature Granulocytes 0.6 0.0 - 3.0 %    Neutrophils Segmented 72.9 (H) 34 - 64 %    Lymphocytes 15.7 (L) 28 - 48 %    Monocytes 8.3 1 - 13 %    Eosinophils 2.3 0 - 5 %    Basophils 0.2 0 - 3 %   Basic Metabolic Panel    Collection Time: 05/21/22  6:52 AM   Result Value Ref Range    Potassium 4.5 3.5 - 5.1 mEq/L    Chloride 109 (H) 98 - 107 mEq/L    Sodium 139 136 - 145 mEq/L    CO2 23 20 - 31 mEq/L    Glucose 90 74 - 106 mg/dl    BUN 15 9 - 23 mg/dl    Creatinine 1.16 0.70 - 1.30 mg/dl    GFR African American >60      GFR Non-African American >60      Calcium 8.6 (L) 8.7 - 10.4 mg/dl    Anion Gap 7 5 - 15 mmol/L       RAD Results:  CTA ABDOMEN PELVIS W WO CONTRAST    Result Date: 05/18/2022  Indication: GI Bleeding. History of diverticulosis. Whipple procedure.Marland Kitchen     IMPRESSION: No active gastrointestinal bleeding. Severe diffuse atherosclerosis. Whipple procedure. Stable hepatic hypodensities. Bilateral renal cortical atrophy. Diverticulosis. Penile implant. Diffuse spinal degenerative change. Comment: CT images of the abdomen and pelvis were obtained prior to and following the administration of intravenous contrast. MIPS projections were obtained. This was compared with May 16, 2022 and January 25, 2022.Marland Kitchen No active gastrointestinal bleeding. The lung bases are hyperaerated. Coronary calcification is observed. Atherosclerotic changes are present throughout. No aneurysm. The celiac, superior mesenteric, inferior mesenteric, iliac and common femoral arteries are patent. 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 Whipple procedure has been performed. Pancreatic head and gallbladder have been resected. Air is present in the nondependent portion of the biliary tree. The liver is of mild low attenuation from fatty infiltration. Small stable hypodensities are noted in the liver. The largest  measures 1 cm in the right lobe Spleen and adrenal glands are unremarkable. Bilateral renal cortical atrophy is present with cysts. Pelvic viscera is unremarkable. Penile implant is noted. Diverticulosis is present in the colon without diverticulitis. No bowel obstruction, free intraperitoneal air, free fluid or appendicitis. Degenerative changes are present throughout the spine. Laminectomy and fusion is been performed at L5-S1. All CT exams at this facility use one or more dose reduction techniques including automatic exposure control, mA/kV adjustment per patient's size, or iterative reconstruction technique. Electronically signed by: Nelda Severe, MD 05/18/2022 3:51 PM EDT          Workstation ID: [id number]     CTA ABDOMEN PELVIS W WO CONTRAST    Result Date: 05/16/2022  DICOM format image data is available to non-affiliated external healthcare facilities or entities on a secure, media free, reciprocally searchable basis with patient authorization for 12 months following the date of the study. Clinical History:rectal bleeding.  Examination: CTA scan of the abdomen and pelvis without and with contrast 05/16/2022 10:22 PM EDT.  5 mm noncontrast spiral scanning was performed from the lung bases to the symphysis pubis. 3 mm spiral scanning is performed with  intravenous contrast in the arterial phase from the lung bases to the symphysis pubis. 3 D MIP, coronal and sagittal reconstruction images has been obtained. Correlation: 01/25/2022 FINDINGS: Lung bases are clear. Atherosclerotic calcification of the coronary arteries, abdominal aorta and mesenteric arteries. Degenerative changes of the spine. Penile implant. Pneumobilia likely related to sphincterotomy. Gallbladder is absent. Multiple small hepatic and renal hypodensities are too small to characterize. There are too numerous to characterize renal hypodensities. Cyst with wall calcification lower pole left kidney. Spleen, pancreatic tail is unremarkable, status post Whipple's. Adrenal glands and bladder are unremarkable. Prostate is enlarged. There are colonic diverticuli. Terminal ileum, appendix, stomach and small bowel loops are within normal limits. Dense atherosclerotic calcification of the abdominal aorta, mesenteric, renal and iliac arteries. No lymph node enlargement or free fluid. Nonobstructing 4 mm left nephrolithiasis.     IMPRESSION: 1. No active GI bleed. 2. Diverticulosis. 3. Status post Whipple's procedure. 4. Multiple hepatic and renal hypodensities some small and too numerous to characterize. 5. 4 mm nonobstructing left nephrolithiasis. Electronically signed by: Kathlene Cote, MD 05/16/2022 10:34 PM EDT          Workstation ID: [id number]         Total clinical care time was 41  minutes of which more than 50% was spent in coordination of care and counseling (time spent with patient/family face to face, physical exam, reviewing laboratory and imaging investigations, speaking with physicians and nursing staff involved in this patient's care).     Dragon medical dictation system was used for part of this note. Unintentional voice recognition errors may occur    Perfecto Kingdom,  MD  [facility]  May 21, 2022  Time: 9:31 AM

## 2022-05-21 NOTE — Progress Notes (Addendum)
INFECTIOUS DISEASE FOLLOW UP NOTE     Requested by: Dr. Ninetta Lights     Reason for consult: bacteremia     Date of admission: 05/16/2022     Date of consult: May 19, 2022      ABX:      Current abx Prior abx    Vanco 3/24-2        ASSESSMENT:       BSI-->Strep Viridans /Alpha hemolytic strep  -3/21 admission BC x 2 1/2 strep viridans, 1/2 GPC pairs, chains, clusters, d/w Microb, no ID on either bctx yet, setting up again today  -Repeat BC x 2 ngtd  -Afebrile with no elevation in WBC  -2D echo without valvular vegetation   GI bleed  -Recurrent rectal bleeding  -H&H nadir 6.2 and 19.1  -Status post transfusions  -3/22 EGD normal  -3/22 flex sig with old blood in rectal vault poor visualization   Recent hospitalization (3/14-3/17/2024) for GI bleed   Pancreatic cancer status post Whipple 2006  -Status post chemo/radiation   CAD-->CABG   History of nephrolithiasis  -June 2020 cystoscopy/laser lithotripsy  -01/25/2022 CTAP tiny nonobstructing bilateral renal calculi   History of COVID November 2020  -Treated with remdesivir, convalescent plasma, Decadron   History of strep pneumonia November 2020   Allergies: Amoxicillin (diarrhea)            RECOMMENDATIONS:      Tmax 98.8, other VSS, 100% room air sat  WBC 8.2 Hgb 9.7 PLT 132 creatinine 1.16 BS 90  Micro 3/21 alphahemolytic gram-positive cocci 1 of 2 blood cultures, second blood culture viridans Streptococcus group preliminary, 3/24 BC Tx x 2 IP  Radiology includes 3/23 CTAP no active GI bleeding severe diffuse atherosclerosis, 3/25 TTE no vegetation seen on all valves,    Impression:  Patient readmitted for GI bleed status post EGD and flex sig 3/22  -GI suspect diverticular bleed      3/21 admitting blood cultures positive--> preliminary ID alphahemolytic strep and viridans streptococci group, discussed with lab not yet able to say  if same or different bacteria   -Differential includes translocation from GI tract versus contaminant versus other/significant, but no obvious peripheral stigmata, no lines or hardware, new TTE reports no valvular vegetation, LFTs not elevated and not septic appearing to suggest biliary source here  -Repeat blood cultures in process, NGTD    Anemia mgt per others    Severe atherosclerosis on CTAP    Status post Whipple procedure    Neck pain-patient reports chronic and worse due to changed Neurontin dose, Dr. Ninetta Lights aware    Rec:    -Continue IV Vanco and will optimize based on final culture data, hopefully tomorrow   -Avoid nephrotoxic medication  -Monitor blood cultures    -Monitor for ADR     -Discussed with patient and Dr. Ninetta Lights today     MICROBIOLOGY:   PREVIOUS CULTURE AND ID DATA  2020  4/27     UCx E. Coli  5/20     UCx E. Coli  6/22     UCx E. Coli  7/2       UCx E. coli  11/15   urine culture Klebsiella pneumonia, E. Coli              COVID 19+              Strep pneumo urine antigen positive     CURRENT CULTURE AND ID DATA  3/21     BC  x 2-->1/2 strep viridans 1/2 GPC-->alphahemolytic strep  3/24     BC x 2  LINES AND CATHETERS:   PIV     SUMMARY:     Tommy Brown is a 77 year old gentleman with history significant for HTN, CAD status post CABG, pancreatic cancer diagnosed 2006 status post Whipple, radiation, chemo, nephrolithiasis with previous lithotripsy and stent placement.     Patient was recently hospitalized [facility] 3/14-3/17/2024 having presented with blood in stools associated with fatigue and lightheadedness.  He was found to have maroon-colored stools.  No anticoagulation with the exception of daily aspirin which was held on admission but eventually restarted prior to discharge..  GI was consulted and recommended CTA if recurrent large bleed and suspected diverticular bleed admitting hemoglobin was 10.6 but during hospitalization remained stable with nadir of 8.2.     Presented to [facility] ER 3/21 with  rectal bleeding.  Initial H&H was stable 8 and 25 but dropped to 6 and 19 requiring transfusions.  CTA and of abdomen and pelvis negative for active GI bleed evidence of diverticulosis with multiple hepatic and renal hypodensities small and too numerous to characterize as well as nonobstructing left nephrolithiasis.  EGD was done on 3/22 which was normal.  Revealed old blood but could not evaluate mucosa.  Serial H&H's have been followed and most recent is 9.3 and 28.1.       Pt denies having fevers or chills prior to admission.  No sick contacts.  No mediport, joint replacements, stents. States he was recently seen in cardiology office and had ultrasound of his heart.      Blood cultures were ordered on admission with 1 of 2+ strep viridans and 1/2 alphahemolytic strep waiting for ID.  Patient has been afebrile, WBC C has remained normal and is 4.6 today with 63% segs and 25% lymphocytes.     ID has been consulted for evaluation and management of strep viridans bacteremia.    SUBJECTIVE :     Interval notes reviewed.  Reports for chronic neck pain worse due to change in usual Neurontin dosing schedule, no fever no abdominal pain, discussed with him echo test and repeat blood culture result to date, have call micro lab no ID had on initial blood cultures but setting up again today hopefully final tomorrow.    OBJECTIVE     BP 116/66   Pulse 73   Temp 98.8 F (37.1 C) (Temporal)   Resp 18   Ht 1.676 m (5\' 6" )   Wt 58.6 kg (129 lb 3 oz)   SpO2 100%   BMI 20.85 kg/m     Temp (24hrs), Avg:97.6 F (36.4 C), Min:96.8 F (36 C), Max:98.8 F (37.1 C)    General: No apparent distress, stable vitals  HEENT: Normocephalic, mucous membranes pink, moist, no oral lesions, no thrush.  Broken tooth noted posterior right lower jaw does not appear to abscess  Neck: Supple, no lymphadenopathy, bilateral carotid bruits  Chest: Symmetrical expansion  Lungs: Decreased in bases but clear bilaterally  Heart:. S1-S2 regular  without S3; 2/6 systolic murmur  Abdomen: Flat, nontender, active bowel sounds  Musculoskeletal: No lower extremity edema  CNS: Awake, alert, oriented x 3.  Smile symmetric tongue midline  SKIN: No rash no jaundice    MEDICATIONS:     Current Facility-Administered Medications   Medication Dose Route Frequency Provider Last Rate Last Admin    traMADol (ULTRAM) tablet 50 mg  50 mg Oral Q6H PRN Knox Saliva, MD  50 mg at 05/21/22 0232    gabapentin (NEURONTIN) capsule 600 mg  600 mg Oral TID Simoes, Ruchita, MD        hydrocortisone (ANUSOL-HC) suppository 25 mg  25 mg Rectal BID PRN Doscher, Vickie L, APRN - NP        *Pharmacy to dose vancomycin  1 each Other RX Placeholder Simoes, Ruchita, MD        vancomycin (VANCOCIN) 1000 mg in sodium chloride 0.9% 250 mL IVPB  1,000 mg IntraVENous Q24H Simoes, Ruchita, MD   Stopped at 05/21/22 1247    [START ON 05/22/2022] Vancomycin Trough Reminder  1 each Other Once Simoes, Ruchita, MD        acetaminophen (TYLENOL) tablet 650 mg  650 mg Oral Q6H PRN Matriano, Crista Curb, MD   650 mg at 05/21/22 1130    0.9 % sodium chloride infusion   IntraVENous PRN Levasseur, Isabel Caprice, MD        pantoprazole (PROTONIX) 40 mg in sodium chloride (PF) 0.9 % 10 mL injection  40 mg IntraVENous Q12H Matriano, Crista Curb, MD   40 mg at 05/21/22 1130    naloxone [facility]) injection 0.4 mg  0.4 mg IntraVENous PRN Illene Regulus, MD        ondansetron [facility]) injection 4 mg  4 mg IntraVENous Q6H PRN Illene Regulus, MD   4 mg at 05/21/22 [id number]    melatonin tablet 3 mg  3 mg Oral Nightly PRN Illene Regulus, MD        allopurinol (ZYLOPRIM) tablet 300 mg  300 mg Oral Daily Illene Regulus, MD   300 mg at 05/21/22 0912    lipase-protease-amylase (CREON) delayed release capsule 48,000 Units  48,000 Units Oral TID WC Matriano, Crista Curb, MD   48,000 Units at 05/21/22 1128    pravastatin (PRAVACHOL) tablet 10 mg  10 mg Oral Daily Illene Regulus, MD   10 mg at  05/21/22 0912    tamsulosin (FLOMAX) capsule 0.8 mg  0.8 mg Oral Daily Illene Regulus, MD   0.8 mg at 05/21/22 0913    temazepam (RESTORIL) capsule 15 mg  15 mg Oral Nightly PRN Illene Regulus, MD   15 mg at 05/20/22 2236    methocarbamol (ROBAXIN) tablet 500 mg  500 mg Oral Daily PRN Illene Regulus, MD   500 mg at 05/21/22 0913         Labs: Results:   Chemistry Recent Labs     05/19/22  0220 05/20/22  0653 05/21/22  0652   NA 140 140 139   K 4.4 4.3 4.5   CL 110* 111* 109*   CO2 24 24 23    BUN 13 15 15       CBC w/Diff Recent Labs     05/19/22  0220 05/20/22  0653 05/21/22  0652   WBC 4.6 4.6 6.2   RBC 2.93* 3.03* 3.15*   HGB 9.3* 9.6* 9.7*   HCT 28.1* 29.2* 30.2*   PLT 117* 122* 132*          RADIOLOGY :        Imaging  CTA ABDOMEN PELVIS W WO CONTRAST    Result Date: 05/18/2022  Indication: GI Bleeding. History of diverticulosis. Whipple procedure.Marland Kitchen     IMPRESSION: No active gastrointestinal bleeding. Severe diffuse atherosclerosis. Whipple procedure. Stable hepatic hypodensities. Bilateral renal cortical atrophy. Diverticulosis. Penile implant. Diffuse spinal  degenerative change. Comment: CT images of the abdomen and pelvis were obtained prior to and following the administration of intravenous contrast. MIPS projections were obtained. This was compared with May 16, 2022 and January 25, 2022.Marland Kitchen No active gastrointestinal bleeding. The lung bases are hyperaerated. Coronary calcification is observed. Atherosclerotic changes are present throughout. No aneurysm. The celiac, superior mesenteric, inferior mesenteric, iliac and common femoral arteries are patent. 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 Whipple procedure has been performed. Pancreatic head and gallbladder have been resected. Air is present in the nondependent portion of the biliary tree. The liver is of mild low attenuation from fatty infiltration. Small stable hypodensities are noted in  the liver. The largest measures 1 cm in the right lobe Spleen and adrenal glands are unremarkable. Bilateral renal cortical atrophy is present with cysts. Pelvic viscera is unremarkable. Penile implant is noted. Diverticulosis is present in the colon without diverticulitis. No bowel obstruction, free intraperitoneal air, free fluid or appendicitis. Degenerative changes are present throughout the spine. Laminectomy and fusion is been performed at L5-S1. All CT exams at this facility use one or more dose reduction techniques including automatic exposure control, mA/kV adjustment per patient's size, or iterative reconstruction technique. Electronically signed by: Nelda Severe, MD 05/18/2022 3:51 PM EDT          Workstation ID: [id number]     CTA ABDOMEN PELVIS W WO CONTRAST    Result Date: 05/16/2022  DICOM format image data is available to non-affiliated external healthcare facilities or entities on a secure, media free, reciprocally searchable basis with patient authorization for 12 months following the date of the study. Clinical History:rectal bleeding.  Examination: CTA scan of the abdomen and pelvis without and with contrast 05/16/2022 10:22 PM EDT.  5 mm noncontrast spiral scanning was performed from the lung bases to the symphysis pubis. 3 mm spiral scanning is performed with intravenous contrast in the arterial phase from the lung bases to the symphysis pubis. 3 D MIP, coronal and sagittal reconstruction images has been obtained. Correlation: 01/25/2022 FINDINGS: Lung bases are clear. Atherosclerotic calcification of the coronary arteries, abdominal aorta and mesenteric arteries. Degenerative changes of the spine. Penile implant. Pneumobilia likely related to sphincterotomy. Gallbladder is absent. Multiple small hepatic and renal hypodensities are too small to characterize. There are too numerous to characterize renal hypodensities. Cyst with wall calcification lower pole left kidney. Spleen, pancreatic tail  is unremarkable, status post Whipple's. Adrenal glands and bladder are unremarkable. Prostate is enlarged. There are colonic diverticuli. Terminal ileum, appendix, stomach and small bowel loops are within normal limits. Dense atherosclerotic calcification of the abdominal aorta, mesenteric, renal and iliac arteries. No lymph node enlargement or free fluid. Nonobstructing 4 mm left nephrolithiasis.     IMPRESSION: 1. No active GI bleed. 2. Diverticulosis. 3. Status post Whipple's procedure. 4. Multiple hepatic and renal hypodensities some small and too numerous to characterize. 5. 4 mm nonobstructing left nephrolithiasis. Electronically signed by: Kathlene Cote, MD 05/16/2022 10:34 PM EDT          Workstation ID: [id number]        I have independently reviewed lab studies and imgaing as well as review of nursing notes and physican notes from the past 24 hours.    Dragon medical dictation software was used for portions of this report. Unintended errors may occur.     Estelle Grumbles, MD  May 21, 2022    [facility]  Office Phone:[phone]  [phone]

## 2022-05-21 NOTE — Progress Notes (Signed)
GI Progress Note    Patient:  Tommy Brown  Date of Birth:  10/01/1945  Age: 77 y.o.  Sex: male  PCP: Daralene Milch, MD  MRN: [id number]                    Admit Date: 05/16/2022    Assessment:   GI bleed.  Painless hematochezia.  Likely diverticular bleed.  CTA neg for evidence of active bleeding. EGD 05/17/22 normal. Colonoscopy 2022 with diverticulosis.  CTA abd/pelvis 05/18/22 with no active GI bleeding. No further bleeding   Acute anemia secondary to #1.  Hgb 9.7 this AM.  Dysphagia with solids  Pancreatic cancer s/p whipple  AKI  2/2 BSI viridans   HTN/HLD  CAD s/p CABG, daily ASA  BPH  Chronic neuropathy  Gout       Active Hospital Problems    Diagnosis Date Noted    Rectal bleeding [K62.5] 05/09/2022     Plan:   Continue PPI therapy and can change to PO daily at d/c   Continue to monitor H/H and transfuse for HGB less than 7.   No further bleeding episodes.   Continue diet as tolerated.   OP follow up with office.   If re bleeding develops: May consider capsule endoscopy if continues bleeding and drop in H/H.  If brisk bleeding with drop in H/H stat CTA abd/pelvis vs nuclear RBC bleeding scan  Will sign off but call if questions     Subjective:   Pt resting in bed. Reports pain in his neck. No further BM or bleeding. Tolerating diet.       Objective:   Physical Exam:  BP 114/62   Pulse 74   Temp 97.3 F (36.3 C) (Temporal)   Resp 18   Ht 1.676 m (5\' 6" )   Wt 58.6 kg (129 lb 3 oz)   SpO2 98%   BMI 20.85 kg/m    Temp (24hrs), Avg:97.1 F (36.2 C), Min:96.8 F (36 C), Max:97.3 F (36.3 C)      Intake/Output Summary (Last 24 hours) at 05/21/2022 0539  Last data filed at 05/20/2022 2248  Gross per 24 hour   Intake 220 ml   Output --   Net 220 ml       Current Facility-Administered Medications   Medication Dose Route Frequency Provider Last Rate Last Admin    traMADol (ULTRAM) tablet 50 mg  50 mg Oral Q6H PRN Knox Saliva, MD   50 mg at 05/21/22 0232    hydrocortisone (ANUSOL-HC)  suppository 25 mg  25 mg Rectal BID PRN Sandeep Radell L, APRN - NP        *Pharmacy to dose vancomycin  1 each Other RX Placeholder Simoes, Ruchita, MD        vancomycin (VANCOCIN) 1000 mg in sodium chloride 0.9% 250 mL IVPB  1,000 mg IntraVENous Q24H Simoes, Ruchita, MD   Stopped at 05/20/22 1204    [START ON 05/22/2022] Vancomycin Trough Reminder  1 each Other Once Simoes, Ruchita, MD        gabapentin (NEURONTIN) capsule 300 mg  300 mg Oral TID Simoes, Ruchita, MD   300 mg at 05/20/22 2056    acetaminophen (TYLENOL) tablet 650 mg  650 mg Oral Q6H PRN Illene Regulus, MD   650 mg at 05/20/22 2328    0.9 % sodium chloride infusion   IntraVENous PRN Levasseur, Isabel Caprice, MD        pantoprazole (PROTONIX)  40 mg in sodium chloride (PF) 0.9 % 10 mL injection  40 mg IntraVENous Q12H Matriano, Crista Curb, MD   40 mg at 05/20/22 2329    naloxone [facility]) injection 0.4 mg  0.4 mg IntraVENous PRN Illene Regulus, MD        ondansetron [facility]) injection 4 mg  4 mg IntraVENous Q6H PRN Matriano, Crista Curb, MD   4 mg at 05/17/22 1020    melatonin tablet 3 mg  3 mg Oral Nightly PRN Matriano, Crista Curb, MD        allopurinol (ZYLOPRIM) tablet 300 mg  300 mg Oral Daily Matriano, Crista Curb, MD   300 mg at 05/20/22 0848    lipase-protease-amylase (CREON) delayed release capsule 48,000 Units  48,000 Units Oral TID WC Matriano, Crista Curb, MD   48,000 Units at 05/20/22 1712    pravastatin (PRAVACHOL) tablet 10 mg  10 mg Oral Daily Matriano, Crista Curb, MD   10 mg at 05/19/22 1024    tamsulosin (FLOMAX) capsule 0.8 mg  0.8 mg Oral Daily Illene Regulus, MD   0.8 mg at 05/20/22 0847    temazepam (RESTORIL) capsule 15 mg  15 mg Oral Nightly PRN Illene Regulus, MD   15 mg at 05/20/22 2236    methocarbamol (ROBAXIN) tablet 500 mg  500 mg Oral Daily PRN Illene Regulus, MD   500 mg at 05/20/22 2055        Physical Exam:  HEENT: NC/AT, PERRLA, Oral Mucosa pink moist    Neck: trachea midline, voice  clear.    CV: S1S2 noted, No R/G.   Resp: Symmetrical chest expansion, Bilateral breath sounds clear without wheeze   GI: Soft NT/ND, Active x4 quad   Ext: No edema, Brisk cap refill/+pulses    Neuro: A/Ox3, Normal affect and mood. No gross neurologic abnormality noted.     DATA    CBC w/Diff   Lab Results   Component Value Date/Time    WBC 4.6 05/20/2022 06:53 AM    WBC 4.6 05/19/2022 02:20 AM    WBC 5.7 05/18/2022 04:30 AM    RBC 3.03 (L) 05/20/2022 06:53 AM    RBC 2.93 (L) 05/19/2022 02:20 AM    RBC 3.19 (L) 05/18/2022 04:30 AM    HGB 9.6 (L) 05/20/2022 06:53 AM    HGB 9.3 (L) 05/19/2022 02:20 AM    HGB 8.8 (L) 05/18/2022 03:48 PM    HCT 29.2 (L) 05/20/2022 06:53 AM    HCT 28.1 (L) 05/19/2022 02:20 AM    HCT 27.1 (L) 05/18/2022 03:48 PM    MCV 96.4 05/20/2022 06:53 AM    MCV 95.9 05/19/2022 02:20 AM    MCV 91.8 05/18/2022 04:30 AM    MCH 31.7 05/20/2022 06:53 AM    MCH 31.7 05/19/2022 02:20 AM    MCH 30.1 05/18/2022 04:30 AM    MCHC 32.9 05/20/2022 06:53 AM    MCHC 33.1 05/19/2022 02:20 AM    MCHC 32.8 05/18/2022 04:30 AM    RDW 57.6 (H) 05/20/2022 06:53 AM    RDW 58.5 (H) 05/19/2022 02:20 AM    RDW 57.1 (H) 05/18/2022 04:30 AM    PLT 122 (L) 05/20/2022 06:53 AM    PLT 117 (L) 05/19/2022 02:20 AM    PLT 109 (L) 05/18/2022 04:30 AM    MPV 10.6 (H) 05/20/2022 06:53 AM    MPV 11.1 (H) 05/19/2022 02:20 AM  MPV 10.9 (H) 05/18/2022 04:30 AM     Lab Results   Component Value Date/Time    MONOS 8.6 05/20/2022 06:53 AM    MONOS 8.7 05/19/2022 02:20 AM    MONOS 8.6 05/18/2022 04:30 AM        Recent Labs     05/19/22  0220 05/20/22  0653   NA 140 140   K 4.4 4.3   CL 110* 111*   CO2 24 24   BUN 13 15   CREATININE 1.20 1.17   GLUCOSE 99 99   CALCIUM 8.4* 8.3*       Hepatic Function   No results for input(s): "ALT", "AST", "ALB", "TP" in the last 72 hours.    Invalid input(s): "TBILI", "CBIL", "AP"       No results for input(s): "ALKPHOS", "ALT", "AST", "PROT", "BILITOT", "BILIDIR", "LABALBU" in the last 72  hours.    Pancreatic Enzymes   No results for input(s): "AML" in the last 72 hours.    Invalid input(s): "LPSE"   No results for input(s): "LIPASE" in the last 72 hours.  Basic Metabolic Profile   Recent Labs     05/19/22  0220 05/20/22  0653   NA 140 140   K 4.4 4.3   CL 110* 111*   CO2 24 24   BUN 13 15          Lab Results   Component Value Date/Time    LABALBU 2.5 05/17/2022 06:41 AM     POC Glucose  No components found for: "GLPOC"    Coagulation   No results for input(s): "INR", "PROTIME" in the last 72 hours.          Radiology Results  CTA ABDOMEN PELVIS W WO CONTRAST    Result Date: 05/16/2022  DICOM format image data is available to non-affiliated external healthcare facilities or entities on a secure, media free, reciprocally searchable basis with patient authorization for 12 months following the date of the study. Clinical History:rectal bleeding.  Examination: CTA scan of the abdomen and pelvis without and with contrast 05/16/2022 10:22 PM EDT.  5 mm noncontrast spiral scanning was performed from the lung bases to the symphysis pubis. 3 mm spiral scanning is performed with intravenous contrast in the arterial phase from the lung bases to the symphysis pubis. 3 D MIP, coronal and sagittal reconstruction images has been obtained. Correlation: 01/25/2022 FINDINGS: Lung bases are clear. Atherosclerotic calcification of the coronary arteries, abdominal aorta and mesenteric arteries. Degenerative changes of the spine. Penile implant. Pneumobilia likely related to sphincterotomy. Gallbladder is absent. Multiple small hepatic and renal hypodensities are too small to characterize. There are too numerous to characterize renal hypodensities. Cyst with wall calcification lower pole left kidney. Spleen, pancreatic tail is unremarkable, status post Whipple's. Adrenal glands and bladder are unremarkable. Prostate is enlarged. There are colonic diverticuli. Terminal ileum, appendix, stomach and small bowel loops are  within normal limits. Dense atherosclerotic calcification of the abdominal aorta, mesenteric, renal and iliac arteries. No lymph node enlargement or free fluid. Nonobstructing 4 mm left nephrolithiasis.     IMPRESSION: 1. No active GI bleed. 2. Diverticulosis. 3. Status post Whipple's procedure. 4. Multiple hepatic and renal hypodensities some small and too numerous to characterize. 5. 4 mm nonobstructing left nephrolithiasis. Electronically signed by: Kathlene Cote, MD 05/16/2022 10:34 PM EDT          Workstation ID: [id number], NP-C  [facility]  Cell # [phone]  Office # [phone]  [address] [place]  [place], [place]

## 2022-05-21 NOTE — Progress Notes (Signed)
Bedside shift change report given to Kamaiya Antilla Nena D Demere Dotzler, RN   (oncoming nurse) by Caitlin Rn (offgoing nurse). Report included the following information Intake/Output and MAR.

## 2022-05-22 LAB — CBC WITH AUTO DIFFERENTIAL
Basophils: 0.4 % (ref 0–3)
Eosinophils: 2.1 % (ref 0–5)
Hematocrit: 30.9 % — ABNORMAL LOW (ref 36.0–55.0)
Hemoglobin: 9.9 gm/dl — ABNORMAL LOW (ref 13.0–18.0)
Immature Granulocytes: 0.7 % (ref 0.0–3.0)
Lymphocytes: 19.2 % — ABNORMAL LOW (ref 28–48)
MCH: 30.3 pg (ref 23.0–34.6)
MCHC: 32 gm/dl (ref 30.0–36.0)
MCV: 94.5 fL (ref 80.0–98.0)
MPV: 10.6 fL — ABNORMAL HIGH (ref 6.0–10.0)
Monocytes: 10.8 % (ref 1–13)
Neutrophils Segmented: 66.8 % — ABNORMAL HIGH (ref 34–64)
Nucleated RBCs: 0 (ref 0–0)
Platelets: 142 10*3/uL (ref 140–450)
RBC: 3.27 M/uL — ABNORMAL LOW (ref 3.80–5.70)
RDW: 56 — ABNORMAL HIGH (ref 35.1–43.9)
WBC: 5.4 10*3/uL (ref 4.0–11.0)

## 2022-05-22 LAB — BASIC METABOLIC PANEL
Anion Gap: 6 mmol/L (ref 5–15)
BUN: 14 mg/dl (ref 9–23)
CO2: 24 mEq/L (ref 20–31)
Calcium: 8.8 mg/dl (ref 8.7–10.4)
Chloride: 106 mEq/L (ref 98–107)
Creatinine: 1.27 mg/dl (ref 0.70–1.30)
GFR African American: >60
GFR Non-African American: 58
Glucose: 100 mg/dl (ref 74–106)
Potassium: 5 mEq/L (ref 3.5–5.1)
Sodium: 136 mEq/L (ref 136–145)

## 2022-05-22 LAB — CULTURE, BLOOD 1

## 2022-05-22 LAB — VANCOMYCIN LEVEL, TROUGH: Vancomycin Tr: 8.4 ug/mL (ref 5.0–10.0)

## 2022-05-22 LAB — CULTURE, BLOOD 2

## 2022-05-22 MED ORDER — GABAPENTIN 300 MG PO CAPS
300 MG | Freq: Four times a day (QID) | ORAL | Status: AC
Start: 2022-05-22 — End: 2022-05-23
  Administered 2022-05-22 – 2022-05-23 (×6): 600 mg via ORAL

## 2022-05-22 MED ORDER — DICLOFENAC SODIUM 1 % EX GEL
1 % | Freq: Two times a day (BID) | CUTANEOUS | Status: AC
Start: 2022-05-22 — End: 2022-05-23
  Administered 2022-05-22 – 2022-05-23 (×3): 2 g via TOPICAL

## 2022-05-22 MED ORDER — PEG 3350-KCL-NABCB-NACL-NASULF 236 G PO SOLR
236 | Freq: Once | ORAL | Status: AC
Start: 2022-05-22 — End: 2022-05-22
  Administered 2022-05-23: 2000 mL via ORAL

## 2022-05-22 MED ORDER — CEPHALEXIN 500 MG PO CAPS
500 MG | Freq: Four times a day (QID) | ORAL | Status: AC
Start: 2022-05-22 — End: 2022-05-23
  Administered 2022-05-22 – 2022-05-23 (×6): 500 mg via ORAL

## 2022-05-22 MED FILL — PANTOPRAZOLE SODIUM 40 MG IV SOLR: 40 MG | INTRAVENOUS | Qty: 40

## 2022-05-22 MED FILL — CREON 6000-19000 UNITS PO CPEP: 6000-19000 units | ORAL | Qty: 1

## 2022-05-22 MED FILL — GABAPENTIN 300 MG PO CAPS: 300 MG | ORAL | Qty: 2

## 2022-05-22 MED FILL — CEPHALEXIN 500 MG PO CAPS: 500 MG | ORAL | Qty: 1

## 2022-05-22 MED FILL — METHOCARBAMOL 500 MG PO TABS: 500 MG | ORAL | Qty: 1

## 2022-05-22 MED FILL — VANCOMYCIN (VANCOCIN) 1000 MG IN SODIUM CHLORIDE 0.9% 250 ML IVPB: Qty: 250

## 2022-05-22 MED FILL — CREON 6000-19000 UNITS PO CPEP: 6000-19000 units | ORAL | Qty: 2

## 2022-05-22 MED FILL — HYDROCORTISONE ACETATE 25 MG RE SUPP: 25 MG | RECTAL | Qty: 1

## 2022-05-22 MED FILL — TEMAZEPAM 7.5 MG PO CAPS: 7.5 MG | ORAL | Qty: 2

## 2022-05-22 MED FILL — VOLTAREN ARTHRITIS PAIN 1 % EX GEL: 1 % | CUTANEOUS | Qty: 100

## 2022-05-22 MED FILL — PRAVASTATIN SODIUM 10 MG PO TABS: 10 MG | ORAL | Qty: 1

## 2022-05-22 MED FILL — ACETAMINOPHEN 325 MG PO TABS: 325 MG | ORAL | Qty: 2

## 2022-05-22 MED FILL — ALLOPURINOL 300 MG PO TABS: 300 MG | ORAL | Qty: 1

## 2022-05-22 MED FILL — PEG-3350/ELECTROLYTES 236 G PO SOLR: 236 g | ORAL | Qty: 4000

## 2022-05-22 MED FILL — TAMSULOSIN HCL 0.4 MG PO CAPS: 0.4 MG | ORAL | Qty: 2

## 2022-05-22 MED FILL — MELATONIN 3 MG PO TABS: 3 MG | ORAL | Qty: 1

## 2022-05-22 NOTE — Progress Notes (Signed)
GI Progress Note    Patient:  Tommy Brown  Date of Birth:  [birth year]  Age: 77 y.o.  Sex: male  PCP: Daralene Milch, MD  MRN: [id number]                    Admit Date: 05/16/2022    Assessment:   GI bleed.  Painless hematochezia.  Likely diverticular bleed.  CTA neg for evidence of active bleeding. EGD 05/17/22 normal. Colonoscopy 2022 with diverticulosis.  CTA abd/pelvis 05/18/22 with no active GI bleeding. No further bleeding   Acute anemia secondary to #1.  Hgb 9.7 this AM.  Dysphagia with solids  Pancreatic cancer s/p whipple  AKI  2/2 BSI viridans   HTN/HLD  CAD s/p CABG, daily ASA  BPH  Chronic neuropathy  Gout    EGD 05/17/22  Normal EGD   Flex sig 05/17/22  Large amount of old blood and unable to adequately visualize the colonic mucosa.  Scope not advanced beyond the sigmoid colon at 20cm.  No active bleeding seen  Active Hospital Problems    Diagnosis Date Noted    Rectal bleeding [K62.5] 05/09/2022     Plan:   D/w Dr. Ninetta Lights, pt had bleeding overnight, small vol, suspected rectal outlet.  D/w wife and pt at bedside, proceed w/ small bowel capsule study tomorrow. They are concerned about d/c and recurrent bleeding  Hgb remains stable 9.9. he was started on anusol last night.   Clears, 2L golytely tonight     Subjective:   Neck pain  BM last night w/ red blood w/ brown stool   No abd pain   Wife at bedside. They are concerned about discharge and recurrent bleeding (and returning to sit in ER for several hours)    Objective:   Physical Exam:  BP 100/64   Pulse 76   Temp 98.8 F (37.1 C) (Temporal)   Resp 18   Ht 1.676 m (5\' 6" )   Wt 58.6 kg (129 lb 3 oz)   SpO2 94%   BMI 20.85 kg/m    Temp (24hrs), Avg:98.4 F (36.9 C), Min:97.6 F (36.4 C), Max:99 F (37.2 C)      Intake/Output Summary (Last 24 hours) at 05/22/2022 1047  Last data filed at 05/22/2022 [id number]  Gross per 24 hour   Intake 340 ml   Output --   Net 340 ml     Current Facility-Administered Medications   Medication Dose  Route Frequency Provider Last Rate Last Admin    gabapentin (NEURONTIN) capsule 600 mg  600 mg Oral 4x daily Simoes, Ruchita, MD        traMADol (ULTRAM) tablet 50 mg  50 mg Oral Q6H PRN Knox Saliva, MD   50 mg at 05/21/22 0232    hydrocortisone (ANUSOL-HC) suppository 25 mg  25 mg Rectal BID Simoes, Ruchita, MD   25 mg at 05/22/22 [id number]    hydrocortisone (ANUSOL-HC) suppository 25 mg  25 mg Rectal BID PRN Doscher, Vickie L, APRN - NP        *Pharmacy to dose vancomycin  1 each Other RX Placeholder Simoes, Ruchita, MD        vancomycin (VANCOCIN) 1000 mg in sodium chloride 0.9% 250 mL IVPB  1,000 mg IntraVENous Q24H Simoes, Ruchita, MD   Stopped at 05/21/22 1247    Vancomycin Trough Reminder  1 each Other Once Simoes, Ruchita, MD        acetaminophen (TYLENOL) tablet 650 mg  650  mg Oral Q6H PRN Illene Regulus, MD   650 mg at 05/22/22 0921    0.9 % sodium chloride infusion   IntraVENous PRN Levasseur, Isabel Caprice, MD        pantoprazole (PROTONIX) 40 mg in sodium chloride (PF) 0.9 % 10 mL injection  40 mg IntraVENous Q12H Matriano, Crista Curb, MD   40 mg at 05/21/22 2150    naloxone [facility]) injection 0.4 mg  0.4 mg IntraVENous PRN Illene Regulus, MD        ondansetron [facility]) injection 4 mg  4 mg IntraVENous Q6H PRN Illene Regulus, MD   4 mg at 05/21/22 1423    melatonin tablet 3 mg  3 mg Oral Nightly PRN Illene Regulus, MD   3 mg at 05/21/22 2158    allopurinol (ZYLOPRIM) tablet 300 mg  300 mg Oral Daily Illene Regulus, MD   300 mg at 05/22/22 [id number]    lipase-protease-amylase (CREON) delayed release capsule 48,000 Units  48,000 Units Oral TID WC Illene Regulus, MD   48,000 Units at 05/22/22 0919    pravastatin (PRAVACHOL) tablet 10 mg  10 mg Oral Daily Illene Regulus, MD   10 mg at 05/21/22 0912    tamsulosin (FLOMAX) capsule 0.8 mg  0.8 mg Oral Daily Illene Regulus, MD   0.8 mg at 05/22/22 0920    temazepam (RESTORIL) capsule 15 mg  15 mg Oral Nightly PRN  Illene Regulus, MD   15 mg at 05/21/22 2159    methocarbamol (ROBAXIN) tablet 500 mg  500 mg Oral Daily PRN Illene Regulus, MD   500 mg at 05/21/22 0913        Physical Exam:  General Appearance: in no acute distress and alert  Pulmonary/Chest: nonlabored RA  Cardiovascular: normal S1 and S2  Abdomen: soft, non-tender    DATA    CBC w/Diff   Lab Results   Component Value Date/Time    WBC 5.4 05/22/2022 06:30 AM    WBC 6.2 05/21/2022 06:52 AM    WBC 4.6 05/20/2022 06:53 AM    RBC 3.27 (L) 05/22/2022 06:30 AM    RBC 3.15 (L) 05/21/2022 06:52 AM    RBC 3.03 (L) 05/20/2022 06:53 AM    HGB 9.9 (L) 05/22/2022 06:30 AM    HGB 9.7 (L) 05/21/2022 06:52 AM    HGB 9.6 (L) 05/20/2022 06:53 AM    HCT 30.9 (L) 05/22/2022 06:30 AM    HCT 30.2 (L) 05/21/2022 06:52 AM    HCT 29.2 (L) 05/20/2022 06:53 AM    MCV 94.5 05/22/2022 06:30 AM    MCV 95.9 05/21/2022 06:52 AM    MCV 96.4 05/20/2022 06:53 AM    MCH 30.3 05/22/2022 06:30 AM    MCH 30.8 05/21/2022 06:52 AM    MCH 31.7 05/20/2022 06:53 AM    MCHC 32.0 05/22/2022 06:30 AM    MCHC 32.1 05/21/2022 06:52 AM    MCHC 32.9 05/20/2022 06:53 AM    RDW 56.0 (H) 05/22/2022 06:30 AM    RDW 59.2 (H) 05/21/2022 06:52 AM    RDW 57.6 (H) 05/20/2022 06:53 AM    PLT 142 05/22/2022 06:30 AM    PLT 132 (L) 05/21/2022 06:52 AM    PLT 122 (L) 05/20/2022 06:53 AM    MPV 10.6 (H) 05/22/2022 06:30 AM    MPV 10.8 (H) 05/21/2022 06:52 AM    MPV  10.6 (H) 05/20/2022 06:53 AM     Lab Results   Component Value Date/Time    MONOS 10.8 05/22/2022 06:30 AM    MONOS 8.3 05/21/2022 06:52 AM    MONOS 8.6 05/20/2022 06:53 AM        Recent Labs     05/20/22  0653 05/21/22  0652 05/22/22  0630   NA 140 139 136   K 4.3 4.5 5.0   CL 111* 109* 106   CO2 24 23 24    BUN 15 15 14    CREATININE 1.17 1.16 1.27   GLUCOSE 99 90 100   CALCIUM 8.3* 8.6* 8.8     Basic Metabolic Profile   Recent Labs     05/20/22  0653 05/21/22  0652 05/22/22  0630   NA 140 139 136   K 4.3 4.5 5.0   CL 111* 109* 106   CO2 24 23 24    BUN  15 15 14         Lab Results   Component Value Date/Time    LABALBU 2.5 05/17/2022 06:41 AM       Radiology Results  CTA ABDOMEN PELVIS W WO CONTRAST    Result Date: 05/16/2022  DICOM format image data is available to non-affiliated external healthcare facilities or entities on a secure, media free, reciprocally searchable basis with patient authorization for 12 months following the date of the study. Clinical History:rectal bleeding.  Examination: CTA scan of the abdomen and pelvis without and with contrast 05/16/2022 10:22 PM EDT.  5 mm noncontrast spiral scanning was performed from the lung bases to the symphysis pubis. 3 mm spiral scanning is performed with intravenous contrast in the arterial phase from the lung bases to the symphysis pubis. 3 D MIP, coronal and sagittal reconstruction images has been obtained. Correlation: 01/25/2022 FINDINGS: Lung bases are clear. Atherosclerotic calcification of the coronary arteries, abdominal aorta and mesenteric arteries. Degenerative changes of the spine. Penile implant. Pneumobilia likely related to sphincterotomy. Gallbladder is absent. Multiple small hepatic and renal hypodensities are too small to characterize. There are too numerous to characterize renal hypodensities. Cyst with wall calcification lower pole left kidney. Spleen, pancreatic tail is unremarkable, status post Whipple's. Adrenal glands and bladder are unremarkable. Prostate is enlarged. There are colonic diverticuli. Terminal ileum, appendix, stomach and small bowel loops are within normal limits. Dense atherosclerotic calcification of the abdominal aorta, mesenteric, renal and iliac arteries. No lymph node enlargement or free fluid. Nonobstructing 4 mm left nephrolithiasis.     IMPRESSION: 1. No active GI bleed. 2. Diverticulosis. 3. Status post Whipple's procedure. 4. Multiple hepatic and renal hypodensities some small and too numerous to characterize. 5. 4 mm nonobstructing left nephrolithiasis.  Electronically signed by: Kathlene Cote, MD 05/16/2022 10:34 PM EDT          Workstation ID: [id number]  Cell # [phone]  Office # [phone]  [address], [place], [place]

## 2022-05-22 NOTE — Progress Notes (Signed)
NUTRITION RECOMMENDATIONS:   Continue Low Fiber, No Red Dye diet.   Order Ensure Enlive at pt's request.  Wts 3x/week to trend.   Monitor electrolytes, replete prn.   Discharge recommendations: regular as tolerated      NUTRITION FOLLOW-UP    Current Diet Order: ADULT DIET; Regular; Low Fiber; No red dye     Current Intake: []  N/A- NPO    []  very poor     []  poor      []  fair      [x]  good  Last meal documented in flowsheets on 3/24 of 51-75%. Provides 1051-1546 kcal (72-100%), 50-74 g protein (85-100%) of pt's estimated needs.     Subjective: Pt unavailable during attempted visits this date.      Pertinent Medications: protonix    Pertinent Labs: labs reviewed    Current Inpatient Weight Trends:    Initial admit wt of 63.5 kg (3/21-stated wt). Lowest BW this admit of 58.6 kg (3/23). Pt currently +1.4 L per I/O.  Weight change: -7.7 x 6 days. (Significant if admit wt is accurate)    Patient Vitals for the past 96 hrs (Last 3 readings):   Weight   05/18/22 1200 58.6 kg (129 lb 3 oz)     BMI: Body mass index is 20.85 kg/m.                  Estimated Daily Nutrition Needs: 58.6 kg current wt  1465 - 1758 kcals (25-30 kcal/kg)   59 - 70 g protein (1.0-1.2 g/kg) CKD, underweight/wt loss  1465 mL fluid (25 ml/kg or per MD)     GI Symptoms/Issues:  -No further GI bleeding per GI 3/26  -Pancreatic cancer s/p whipple   Last BM (including prior to admit): 05/21/22  GI Symptoms: Diarrhea, Nausea    Chewing/Swallowing Issues: Pt on regular texture diet.     Skin Integrity: intact per flow sheets.      Fluid Accumulation/Edema: none noted    Physical Assessment: No muscle or fat wasting noted.      Assessment of Current MNT: Diet is appropriate and adequate to meet nutrition needs if pt consuming 75% of trays on average, recommend adding oral nutrition supplements to increase protein-calorie opportunity.     NUTRITION DIAGNOSIS:     Underweight related to recent unintended wt loss (not significant) of unknown etiology as  evidenced by BMI <23 for older adults.    NUTRITION INTERVENTION / RECOMMENDATIONS:     Continue Low Fiber, No Red Dye diet.   Order Ensure Enlive at pt's request.  Wts 3x/week to trend.   Monitor electrolytes, replete prn.   Discharge recommendations: regular as tolerated    NUTRITION MONITORING AND EVALUATION:     Nutrition Level of Care:   []  Low       [x]  Moderate    []  High    Progress Towards Nutrition Goals: []   Met/Ongoing     [x]   Progressing Appropriately     []   Progressing Slowly     []   Not Progressing    Monitoring and Evaluation: PO intake, wt trends, nutrition related labs, skin integrity, and if applicable, supplement acceptance, nutrition support tolerance.     Nutrition Goals:  Pt to meet/tolerate >75% of estimated needs, weight maintenance throughout LOS, BG control <180, Improvement and/or maintenance of skin integrity.    Code Status: Full Code     Zenovia Jordan, RD,   05/22/22   Office: [phone]  On-Call Phone:  [phone]

## 2022-05-22 NOTE — Progress Notes (Signed)
INFECTIOUS DISEASE FOLLOW UP NOTE     Requested by: Dr. Ninetta Lights     Reason for consult: bacteremia     Date of admission: 05/16/2022     Date of consult: May 19, 2022      ABX:      Current abx Prior abx    Keflex 3/27-0, antibiotic 3  Vanco 3/24-3      ASSESSMENT:       BSI-->Strep Viridans /Alpha hemolytic strep-now identified as leuconostoc  -3/21 admission BC x 2 1/2 strep viridans, 1/2 GPC pairs, chains, clusters but now both sets identified as leuconostoc  -Repeat BC x 2 ngtd  -Afebrile with no elevation in WBC  -2D echo without valvular vegetation  -Patient does have hardware in his neck and with chronic neck pain and without any concern  -Doubt true infection and suspect more contaminant versus translocation secondary to GI bleed   GI bleed  -Recurrent rectal bleeding  -H&H nadir 6.2 and 19.1  -Status post transfusions  -3/22 EGD normal  -3/22 flex sig with old blood in rectal vault poor visualization  -Plan for capsule endoscopy   Recent hospitalization (3/14-3/17/2024) for GI bleed   Pancreatic cancer status post Whipple 2006  -Status post chemo/radiation   CAD-->CABG   History of nephrolithiasis  -June 2020 cystoscopy/laser lithotripsy  -01/25/2022 CTAP tiny nonobstructing bilateral renal calculi   History of COVID November 2020  -Treated with remdesivir, convalescent plasma, Decadron   History of strep pneumonia November 2020   Allergies: Amoxicillin (diarrhea)            RECOMMENDATIONS:      -Stop IV Vanco  -Start Keflex and complete total 7 days  -Monitor labs  -GI bleed workup per others  -Monitor for any ADE  -Discussed with wife at bedside  -Discussed with Dr. Ninetta Lights     MICROBIOLOGY:   PREVIOUS CULTURE AND ID DATA  2020  4/27     UCx E. Coli  5/20     UCx E. Coli  6/22     UCx E. Coli  7/2       UCx E. coli  11/15   urine culture Klebsiella pneumonia, E.  Coli              COVID 19+              Strep pneumo urine antigen positive     CURRENT CULTURE AND ID DATA  3/21     BC x 2-->1/2 strep viridans 1/2 GPC-->alphahemolytic strep  3/24     BC x 2    LINES AND CATHETERS:   PIV     SUMMARY:     [name] is a 77 year old gentleman with history significant for HTN, CAD status post CABG, pancreatic cancer diagnosed 2006 status post Whipple, radiation, chemo, nephrolithiasis with previous lithotripsy and stent placement.     Patient was recently hospitalized [facility] 3/14-3/17/2024 having presented with blood in stools associated with fatigue and lightheadedness.  He was found to have maroon-colored stools.  No anticoagulation with the exception of daily aspirin which was held on admission but eventually restarted prior to discharge..  GI was consulted and recommended CTA if recurrent large bleed and suspected diverticular bleed admitting hemoglobin was 10.6 but during hospitalization remained stable with nadir of 8.2.     Presented to [facility] ER 3/21 with rectal bleeding.  Initial H&H was stable 8 and 25 but dropped to 6 and 19 requiring transfusions.  CTA and  of abdomen and pelvis negative for active GI bleed evidence of diverticulosis with multiple hepatic and renal hypodensities small and too numerous to characterize as well as nonobstructing left nephrolithiasis.  EGD was done on 3/22 which was normal.  Revealed old blood but could not evaluate mucosa.  Serial H&H's have been followed and most recent is 9.3 and 28.1.       Pt denies having fevers or chills prior to admission.  No sick contacts.  No mediport, joint replacements, stents. States he was recently seen in cardiology office and had ultrasound of his heart.      Blood cultures 2 of 2 from admission with strep viridans and 1/2 alphahemolytic strep and later identified as Leuconostoc.  Repeat blood cultures negative and echo without any valve vegetation.  Antibiotics switched to Keflex   SUBJECTIVE :     Interval  notes reviewed.  Afebrile.  Complaining of pain in his neck.  No nausea vomiting.  Plan for capsule endoscopy tomorrow.  Wife at bedside and updates given.  Will stop IV antibiotics.    OBJECTIVE     BP 105/60   Pulse 70   Temp 97.6 F (36.4 C) (Temporal)   Resp 19   Ht 1.676 m (5\' 6" )   Wt 58.6 kg (129 lb 3 oz)   SpO2 96%   BMI 20.85 kg/m     Temp (24hrs), Avg:98.2 F (36.8 C), Min:97.6 F (36.4 C), Max:99 F (37.2 C)    General: No apparent distress, stable vitals  HEENT: Normocephalic, mucous membranes pink, moist, no oral lesions, no thrush.  Broken tooth noted posterior right lower jaw does not appear to abscess  Neck: Supple, no lymphadenopathy, bilateral carotid bruits  Chest: Symmetrical expansion  Lungs: Decreased in bases but clear bilaterally  Heart:. S1-S2 regular without S3; 2/6 systolic murmur  Abdomen: Flat, nontender, active bowel sounds  Musculoskeletal: No lower extremity edema  CNS: Awake, alert, oriented x 3.  Smile symmetric tongue midline  SKIN: No rash no jaundice    MEDICATIONS:     Current Facility-Administered Medications   Medication Dose Route Frequency Provider Last Rate Last Admin    traMADol (ULTRAM) tablet 50 mg  50 mg Oral Q6H PRN Knox Saliva, MD   50 mg at 05/21/22 0232    gabapentin (NEURONTIN) capsule 600 mg  600 mg Oral TID Simoes, Ruchita, MD   600 mg at 05/21/22 2120    hydrocortisone (ANUSOL-HC) suppository 25 mg  25 mg Rectal BID Simoes, Ruchita, MD   25 mg at 05/21/22 2120    hydrocortisone (ANUSOL-HC) suppository 25 mg  25 mg Rectal BID PRN Doscher, Vickie L, APRN - NP        *Pharmacy to dose vancomycin  1 each Other RX Placeholder Simoes, Ruchita, MD        vancomycin (VANCOCIN) 1000 mg in sodium chloride 0.9% 250 mL IVPB  1,000 mg IntraVENous Q24H Simoes, Ruchita, MD   Stopped at 05/21/22 1247    Vancomycin Trough Reminder  1 each Other Once Simoes, Ruchita, MD        acetaminophen (TYLENOL) tablet 650 mg  650 mg Oral Q6H PRN Matriano, Crista Curb, MD    650 mg at 05/21/22 1130    0.9 % sodium chloride infusion   IntraVENous PRN Levasseur, Isabel Caprice, MD        pantoprazole (PROTONIX) 40 mg in sodium chloride (PF) 0.9 % 10 mL injection  40 mg IntraVENous Q12H Matriano, Patriciaann Clan  H, MD   40 mg at 05/21/22 2150    naloxone [facility]) injection 0.4 mg  0.4 mg IntraVENous PRN Illene Regulus, MD        ondansetron [facility]) injection 4 mg  4 mg IntraVENous Q6H PRN Illene Regulus, MD   4 mg at 05/21/22 1423    melatonin tablet 3 mg  3 mg Oral Nightly PRN Illene Regulus, MD   3 mg at 05/21/22 2158    allopurinol (ZYLOPRIM) tablet 300 mg  300 mg Oral Daily Illene Regulus, MD   300 mg at 05/21/22 0912    lipase-protease-amylase (CREON) delayed release capsule 48,000 Units  48,000 Units Oral TID WC Illene Regulus, MD   48,000 Units at 05/21/22 1719    pravastatin (PRAVACHOL) tablet 10 mg  10 mg Oral Daily Illene Regulus, MD   10 mg at 05/21/22 0912    tamsulosin (FLOMAX) capsule 0.8 mg  0.8 mg Oral Daily Illene Regulus, MD   0.8 mg at 05/21/22 0913    temazepam (RESTORIL) capsule 15 mg  15 mg Oral Nightly PRN Illene Regulus, MD   15 mg at 05/21/22 2159    methocarbamol (ROBAXIN) tablet 500 mg  500 mg Oral Daily PRN Illene Regulus, MD   500 mg at 05/21/22 0913         Labs: Results:   Chemistry Recent Labs     05/20/22  0653 05/21/22  0652 05/22/22  0630   NA 140 139 136   K 4.3 4.5 5.0   CL 111* 109* 106   CO2 24 23 24    BUN 15 15 14         CBC w/Diff Recent Labs     05/20/22  0653 05/21/22  0652 05/22/22  0630   WBC 4.6 6.2 5.4   RBC 3.03* 3.15* 3.27*   HGB 9.6* 9.7* 9.9*   HCT 29.2* 30.2* 30.9*   PLT 122* 132* 142            RADIOLOGY :        Imaging  CTA ABDOMEN PELVIS W WO CONTRAST    Result Date: 05/18/2022  Indication: GI Bleeding. History of diverticulosis. Whipple procedure.Marland Kitchen     IMPRESSION: No active gastrointestinal bleeding. Severe diffuse atherosclerosis. Whipple procedure. Stable hepatic hypodensities.  Bilateral renal cortical atrophy. Diverticulosis. Penile implant. Diffuse spinal degenerative change. Comment: CT images of the abdomen and pelvis were obtained prior to and following the administration of intravenous contrast. MIPS projections were obtained. This was compared with May 16, 2022 and January 25, 2022.Marland Kitchen No active gastrointestinal bleeding. The lung bases are hyperaerated. Coronary calcification is observed. Atherosclerotic changes are present throughout. No aneurysm. The celiac, superior mesenteric, inferior mesenteric, iliac and common femoral arteries are patent. 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 Whipple procedure has been performed. Pancreatic head and gallbladder have been resected. Air is present in the nondependent portion of the biliary tree. The liver is of mild low attenuation from fatty infiltration. Small stable hypodensities are noted in the liver. The largest measures 1 cm in the right lobe Spleen and adrenal glands are unremarkable. Bilateral renal cortical atrophy is present with cysts. Pelvic viscera is unremarkable. Penile implant is noted. Diverticulosis is present in the colon without diverticulitis. No bowel obstruction, free intraperitoneal air, free fluid or appendicitis. Degenerative changes are present throughout the spine. Laminectomy  and fusion is been performed at L5-S1. All CT exams at this facility use one or more dose reduction techniques including automatic exposure control, mA/kV adjustment per patient's size, or iterative reconstruction technique. Electronically signed by: Nelda Severe, MD 05/18/2022 3:51 PM EDT          Workstation ID: [id number]     CTA ABDOMEN PELVIS W WO CONTRAST    Result Date: 05/16/2022  DICOM format image data is available to non-affiliated external healthcare facilities or entities on a secure, media free, reciprocally searchable basis with patient authorization for 12 months following  the date of the study. Clinical History:rectal bleeding.  Examination: CTA scan of the abdomen and pelvis without and with contrast 05/16/2022 10:22 PM EDT.  5 mm noncontrast spiral scanning was performed from the lung bases to the symphysis pubis. 3 mm spiral scanning is performed with intravenous contrast in the arterial phase from the lung bases to the symphysis pubis. 3 D MIP, coronal and sagittal reconstruction images has been obtained. Correlation: 01/25/2022 FINDINGS: Lung bases are clear. Atherosclerotic calcification of the coronary arteries, abdominal aorta and mesenteric arteries. Degenerative changes of the spine. Penile implant. Pneumobilia likely related to sphincterotomy. Gallbladder is absent. Multiple small hepatic and renal hypodensities are too small to characterize. There are too numerous to characterize renal hypodensities. Cyst with wall calcification lower pole left kidney. Spleen, pancreatic tail is unremarkable, status post Whipple's. Adrenal glands and bladder are unremarkable. Prostate is enlarged. There are colonic diverticuli. Terminal ileum, appendix, stomach and small bowel loops are within normal limits. Dense atherosclerotic calcification of the abdominal aorta, mesenteric, renal and iliac arteries. No lymph node enlargement or free fluid. Nonobstructing 4 mm left nephrolithiasis.     IMPRESSION: 1. No active GI bleed. 2. Diverticulosis. 3. Status post Whipple's procedure. 4. Multiple hepatic and renal hypodensities some small and too numerous to characterize. 5. 4 mm nonobstructing left nephrolithiasis. Electronically signed by: Kathlene Cote, MD 05/16/2022 10:34 PM EDT          Workstation ID: [id number]        I have independently reviewed lab studies and imgaing as well as review of nursing notes and physican notes from the past 24 hours.    Dragon medical dictation software was used for portions of this report. Unintended errors may occur.     Tobin Chad, MD  May 22, 2022    [facility]

## 2022-05-22 NOTE — Plan of Care (Signed)
Problem: Skin/Tissue Integrity  Goal: Absence of new skin breakdown  Description: 1.  Monitor for areas of redness and/or skin breakdown  2.  Assess vascular access sites hourly  3.  Every 4-6 hours minimum:  Change oxygen saturation probe site  4.  Every 4-6 hours:  If on nasal continuous positive airway pressure, respiratory therapy assess nares and determine need for appliance change or resting period.  Outcome: Progressing     Problem: Pain  Goal: Verbalizes/displays adequate comfort level or baseline comfort level  Outcome: Progressing

## 2022-05-22 NOTE — Progress Notes (Signed)
INTERNAL MEDICINE PROGRESS NOTE    Date of note:      May 22, 2022    Patient:               Tommy Brown, 77 y.o., male  Admit Date:        05/16/2022  Length of Stay:  6 day(s)    Problem List:   Patient Active Problem List   Diagnosis    UTI (urinary tract infection)    History of syncope    AKI (acute kidney injury) ([place])    Hypertensive retinopathy, bilateral    Hx of CABG    Intraductal papillary mucinous neoplasm    Vitreous degeneration, left eye    Clostridium difficile diarrhea    Syncope    Atherosclerosis of coronary artery    Chest pain    H/O neck surgery    Palpitations    Bacteremia due to Klebsiella pneumoniae    Essential hypertension    Coronary artery disease (CAD) excluded    Pneumonia    Testosterone deficiency    Cervical disc disease    Port-A-Cath in place    AR (allergic rhinitis)    Acute chest pain    Metabolic acidosis    Murmur, cardiac    Sepsis ([place])    Liver cyst    BPH (benign prostatic hyperplasia)    Carotid artery disease (HCC)    Seasonal allergies    Malignant neoplasm of head of pancreas (HCC)    Hypercholesteremia    Puckering of macula, right eye    Tributary (branch) retinal vein occlusion, right eye, stable    Dyslipidemia    Ejection fraction < 50%    Diarrhea    Recurrent nephrolithiasis    Preop cardiovascular exam    Gout    Steatorrhea    Pneumonia due to COVID-19 virus    History of coronary artery disease    Rectal bleeding    Lower GI bleed       Subjective + interval history     Patient had small amount of bowel movement with bright red bleeding yesterday.  This morning, he had a brown bowel movement.  He complains of chronic neck pain, for which he is on gabapentin.  Assessment :     Acute painless hematochezia - suspect diverticular bleeding  Acute on chronic anemia due to GI bleeding  Recent hospitalization from 05/09/22 to 05/12/22 for lower GI bleeding  CKD III A  CAD s/p CABG  Hypertension  dyslipidemia  Pancreatic cancer s/p Whipple's  procedure  BPH  Strep viridans blood stream infection  Moderate aortic stenosis          Plan :     Patient was recently discharged on 05/12/22 - presented to the ER on 3/21 for rectal bleeding   CTA  abdomen 05/16/22  - no active GI bleed.  + diverticulosis  Hemoglobin went down to 6.2 - s/p 3 units PRBC transfusion   CTA repeated on 3/23 for bleeding - no source or active GI bleed was found     Hemoglobin is stable 9.7  Continue PPI  -Discussed with GI team.  Plan for small bowel PillCam study tomorrow to rule out any small bowel bleeding.  Continue Anusol suppository.        S/p EGD 05/17/22 - normal  Flex sigmoidoscopy 05/17/22 - Large amount of old blood and unable to adequately visualize the colonic mucosa.  Scope not advanced beyond the sigmoid  colon at 20cm.  No active bleeding seen     Blood cultures from 05/16/22 are positive for Leuconostoc  Blood cultures from 3/24 - neg to date  ECHO 05/20/22 with EF 49%, moderate aortic stenosis, AVA 0.54 cm 2, no significant change since 06/2019  -Discussed with ID.  Patient has been on IV vancomycin, which will be stopped today, switch to Keflex to complete total 7 days.     -Patient is requesting to start gabapentin 600 mg 4 times daily (home dose) for chronic neck pain    Anticipate discharge Friday on 3/29     Objective:     Visit Vitals  BP 100/64   Pulse 76   Temp 98.8 F (37.1 C) (Temporal)   Resp 18   Ht 1.676 m (5\' 6" )   Wt 58.6 kg (129 lb 3 oz)   SpO2 94%   BMI 20.85 kg/m                Intake/Output Summary (Last 24 hours) at 05/22/2022 1003  Last data filed at 05/22/2022 [id number]  Gross per 24 hour   Intake 340 ml   Output --   Net 340 ml       Physical Exam:     GEN - AAOx3, NAD  HEENT - mucous membranes moist  Neck - supple, no JVD  Cardiac - RRR, S1, S2, 2/6 systolic murmur  Chest/Lungs - clear to auscultation without wheezes or rhonchi  Abdomen - soft, nontender,   Extremities - no clubbing/ cyanosis/ edema  Neuro - follows commands, ambulating    Current  medications:       Current Facility-Administered Medications:     traMADol (ULTRAM) tablet 50 mg, 50 mg, Oral, Q6H PRN, Knox Saliva, MD, 50 mg at 05/21/22 0232    gabapentin (NEURONTIN) capsule 600 mg, 600 mg, Oral, TID, Lafayette Dunlevy, MD, 600 mg at 05/22/22 0919    hydrocortisone (ANUSOL-HC) suppository 25 mg, 25 mg, Rectal, BID, Deriana Vanderhoef, MD, 25 mg at 05/22/22 [id number]    hydrocortisone (ANUSOL-HC) suppository 25 mg, 25 mg, Rectal, BID PRN, Doscher, Vickie L, APRN - NP    *Pharmacy to dose vancomycin, 1 each, Other, RX Placeholder, Oberon Hehir, MD    vancomycin (VANCOCIN) 1000 mg in sodium chloride 0.9% 250 mL IVPB, 1,000 mg, IntraVENous, Q24H, Darlynn Ricco, MD, Stopped at 05/21/22 1247    Vancomycin Trough Reminder, 1 each, Other, Once, Nicko Daher, MD    acetaminophen (TYLENOL) tablet 650 mg, 650 mg, Oral, Q6H PRN, Matriano, Crista Curb, MD, 650 mg at 05/22/22 0921    0.9 % sodium chloride infusion, , IntraVENous, PRN, Levasseur, Isabel Caprice, MD    pantoprazole (PROTONIX) 40 mg in sodium chloride (PF) 0.9 % 10 mL injection, 40 mg, IntraVENous, Q12H, Matriano, Crista Curb, MD, 40 mg at 05/21/22 2150    naloxone [facility]) injection 0.4 mg, 0.4 mg, IntraVENous, PRN, Matriano, Crista Curb, MD    ondansetron [facility]) injection 4 mg, 4 mg, IntraVENous, Q6H PRN, Matriano, Crista Curb, MD, 4 mg at 05/21/22 1423    melatonin tablet 3 mg, 3 mg, Oral, Nightly PRN, Matriano, Crista Curb, MD, 3 mg at 05/21/22 2158    allopurinol (ZYLOPRIM) tablet 300 mg, 300 mg, Oral, Daily, Matriano, Crista Curb, MD, 300 mg at 05/22/22 0921    lipase-protease-amylase (CREON) delayed release capsule 48,000 Units, 48,000 Units, Oral, TID WC, Matriano, Crista Curb, MD, 48,000 Units at 05/22/22 [phone]  pravastatin (PRAVACHOL) tablet 10 mg, 10 mg, Oral, Daily, Matriano, Crista Curb, MD, 10 mg at 05/21/22 0912    tamsulosin (FLOMAX) capsule 0.8 mg, 0.8 mg, Oral, Daily, Matriano, Crista Curb, MD, 0.8 mg at 05/22/22 0920     temazepam (RESTORIL) capsule 15 mg, 15 mg, Oral, Nightly PRN, Matriano, Crista Curb, MD, 15 mg at 05/21/22 2159    methocarbamol (ROBAXIN) tablet 500 mg, 500 mg, Oral, Daily PRN, Illene Regulus, MD, 500 mg at 05/21/22 0913    Labs:     Recent Results (from the past 24 hour(s))   CBC with Auto Differential    Collection Time: 05/22/22  6:30 AM   Result Value Ref Range    WBC 5.4 4.0 - 11.0 1000/mm3    RBC 3.27 (L) 3.80 - 5.70 M/uL    Hemoglobin 9.9 (L) 13.0 - 18.0 gm/dl    Hematocrit 30.9 (L) 36.0 - 55.0 %    MCV 94.5 80.0 - 98.0 fL    MCH 30.3 23.0 - 34.6 pg    MCHC 32.0 30.0 - 36.0 gm/dl    Platelets 142 140 - 450 1000/mm3    MPV 10.6 (H) 6.0 - 10.0 fL    RDW 56.0 (H) 35.1 - 43.9      Nucleated RBCs 0 0 - 0      Immature Granulocytes 0.7 0.0 - 3.0 %    Neutrophils Segmented 66.8 (H) 34 - 64 %    Lymphocytes 19.2 (L) 28 - 48 %    Monocytes 10.8 1 - 13 %    Eosinophils 2.1 0 - 5 %    Basophils 0.4 0 - 3 %   Basic Metabolic Panel    Collection Time: 05/22/22  6:30 AM   Result Value Ref Range    Potassium 5.0 3.5 - 5.1 mEq/L    Chloride 106 98 - 107 mEq/L    Sodium 136 136 - 145 mEq/L    CO2 24 20 - 31 mEq/L    Glucose 100 74 - 106 mg/dl    BUN 14 9 - 23 mg/dl    Creatinine 1.27 0.70 - 1.30 mg/dl    GFR African American >60      GFR Non-African American 58      Calcium 8.8 8.7 - 10.4 mg/dl    Anion Gap 6 5 - 15 mmol/L       RAD Results:  CTA ABDOMEN PELVIS W WO CONTRAST    Result Date: 05/18/2022  Indication: GI Bleeding. History of diverticulosis. Whipple procedure.Marland Kitchen     IMPRESSION: No active gastrointestinal bleeding. Severe diffuse atherosclerosis. Whipple procedure. Stable hepatic hypodensities. Bilateral renal cortical atrophy. Diverticulosis. Penile implant. Diffuse spinal degenerative change. Comment: CT images of the abdomen and pelvis were obtained prior to and following the administration of intravenous contrast. MIPS projections were obtained. This was compared with May 16, 2022 and January 25, 2022.Marland Kitchen No active gastrointestinal bleeding. The lung bases are hyperaerated. Coronary calcification is observed. Atherosclerotic changes are present throughout. No aneurysm. The celiac, superior mesenteric, inferior mesenteric, iliac and common femoral arteries are patent. 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 Whipple procedure has been performed. Pancreatic head and gallbladder have been resected. Air is present in the nondependent portion of the biliary tree. The liver is of mild low attenuation from fatty infiltration. Small stable hypodensities are noted in the liver. The largest measures 1 cm in the right lobe Spleen  and adrenal glands are unremarkable. Bilateral renal cortical atrophy is present with cysts. Pelvic viscera is unremarkable. Penile implant is noted. Diverticulosis is present in the colon without diverticulitis. No bowel obstruction, free intraperitoneal air, free fluid or appendicitis. Degenerative changes are present throughout the spine. Laminectomy and fusion is been performed at L5-S1. All CT exams at this facility use one or more dose reduction techniques including automatic exposure control, mA/kV adjustment per patient's size, or iterative reconstruction technique. Electronically signed by: Nelda Severe, MD 05/18/2022 3:51 PM EDT          Workstation ID: [id number]     CTA ABDOMEN PELVIS W WO CONTRAST    Result Date: 05/16/2022  DICOM format image data is available to non-affiliated external healthcare facilities or entities on a secure, media free, reciprocally searchable basis with patient authorization for 12 months following the date of the study. Clinical History:rectal bleeding.  Examination: CTA scan of the abdomen and pelvis without and with contrast 05/16/2022 10:22 PM EDT.  5 mm noncontrast spiral scanning was performed from the lung bases to the symphysis pubis. 3 mm spiral scanning is performed with intravenous contrast in the  arterial phase from the lung bases to the symphysis pubis. 3 D MIP, coronal and sagittal reconstruction images has been obtained. Correlation: 01/25/2022 FINDINGS: Lung bases are clear. Atherosclerotic calcification of the coronary arteries, abdominal aorta and mesenteric arteries. Degenerative changes of the spine. Penile implant. Pneumobilia likely related to sphincterotomy. Gallbladder is absent. Multiple small hepatic and renal hypodensities are too small to characterize. There are too numerous to characterize renal hypodensities. Cyst with wall calcification lower pole left kidney. Spleen, pancreatic tail is unremarkable, status post Whipple's. Adrenal glands and bladder are unremarkable. Prostate is enlarged. There are colonic diverticuli. Terminal ileum, appendix, stomach and small bowel loops are within normal limits. Dense atherosclerotic calcification of the abdominal aorta, mesenteric, renal and iliac arteries. No lymph node enlargement or free fluid. Nonobstructing 4 mm left nephrolithiasis.     IMPRESSION: 1. No active GI bleed. 2. Diverticulosis. 3. Status post Whipple's procedure. 4. Multiple hepatic and renal hypodensities some small and too numerous to characterize. 5. 4 mm nonobstructing left nephrolithiasis. Electronically signed by: Kathlene Cote, MD 05/16/2022 10:34 PM EDT          Workstation ID: [id number]         Total clinical care time was 40  minutes of which more than 50% was spent in coordination of care and counseling (time spent with patient/family face to face, physical exam, reviewing laboratory and imaging investigations, speaking with physicians and nursing staff involved in this patient's care).     Dragon medical dictation system was used for part of this note. Unintentional voice recognition errors may occur    Perfecto Kingdom,  MD  [facility]  May 22, 2022  Time: 10:03 AM

## 2022-05-23 LAB — HEMOGLOBIN AND HEMATOCRIT
Hematocrit: 31.2 % — ABNORMAL LOW (ref 36.0–55.0)
Hemoglobin: 10.2 gm/dl — ABNORMAL LOW (ref 13.0–18.0)

## 2022-05-23 MED ORDER — HYDROCORTISONE ACETATE 25 MG RE SUPP
25 MG | Freq: Two times a day (BID) | RECTAL | 0 refills | Status: AC
Start: 2022-05-23 — End: 2022-08-20

## 2022-05-23 MED ORDER — CEPHALEXIN 500 MG PO CAPS
500 | ORAL_CAPSULE | Freq: Four times a day (QID) | ORAL | 0 refills | Status: AC
Start: 2022-05-23 — End: 2022-05-26

## 2022-05-23 MED FILL — METHOCARBAMOL 500 MG PO TABS: 500 MG | ORAL | Qty: 1

## 2022-05-23 MED FILL — ALLOPURINOL 300 MG PO TABS: 300 MG | ORAL | Qty: 1

## 2022-05-23 MED FILL — PANTOPRAZOLE SODIUM 40 MG IV SOLR: 40 MG | INTRAVENOUS | Qty: 40

## 2022-05-23 MED FILL — GABAPENTIN 300 MG PO CAPS: 300 MG | ORAL | Qty: 2

## 2022-05-23 MED FILL — TEMAZEPAM 7.5 MG PO CAPS: 7.5 MG | ORAL | Qty: 2

## 2022-05-23 MED FILL — PRAVASTATIN SODIUM 10 MG PO TABS: 10 MG | ORAL | Qty: 1

## 2022-05-23 MED FILL — CREON 6000-19000 UNITS PO CPEP: 6000-19000 units | ORAL | Qty: 1

## 2022-05-23 MED FILL — HYDROCORTISONE ACETATE 25 MG RE SUPP: 25 MG | RECTAL | Qty: 1

## 2022-05-23 MED FILL — CEPHALEXIN 500 MG PO CAPS: 500 MG | ORAL | Qty: 1

## 2022-05-23 MED FILL — TAMSULOSIN HCL 0.4 MG PO CAPS: 0.4 MG | ORAL | Qty: 2

## 2022-05-23 NOTE — Plan of Care (Signed)
Problem: Safety - Adult  Goal: Free from fall injury  Outcome: Adequate for Discharge

## 2022-05-23 NOTE — Progress Notes (Signed)
He has apt scheduled 06/24/22 w/ Dr. Shella Maxim NP-C  [facility]  Cell # [phone]  Office # [phone]  [address], [place]  [place], [place]

## 2022-05-23 NOTE — Progress Notes (Addendum)
Infectious Disease Attending- Cosign    I have reviewed Tommy Brown's  chart. I saw and examined the patient independently, personally did HPI and Assessment & Plan and discussed with the treatment team. I reviewed the NP's note and concur with it and made my edits as needed. I have discussed and formulated the Assessment and Plan for her. Laboratory, hemodynamic and radiological data were independently reviewed by myself.     -[name] is a 77 year old gentleman with history significant for HTN, CAD status post CABG, pancreatic cancer diagnosed 2006 status post Whipple, radiation, chemo, nephrolithiasis with previous lithotripsy and stent placement.     Presented to [facility] ER 3/21 with rectal bleeding.  CTA and of abdomen and pelvis negative for active GI bleed evidence of diverticulosis with multiple hepatic and renal hypodensities small and too numerous to characterize as well as nonobstructing left nephrolithiasis.  EGD 3/22 was normal.  Revealed old blood but could not evaluate mucosa.  Stable HB.  Blood cultures 2 of 2 from admission with strep viridans and 1/2 alphahemolytic strep and later identified as Leuconostoc.  Repeat blood cultures negative and echo without any valve vegetation.  Antibiotics switched to Keflex     Afebrile, stable vitals, not requiring any supplemental  No new labs and hemoglobin has been stable at 10.2  No new micro and 3/24 blood cultures remain negative  No new radiological data      Assessment  -BSI Leuconostoc  -Unclear significance and suspect more contaminant as negative repeat blood cultures, echo without any valvular vegetation  -Patient does have hardware in his neck but no concerns for infection    GI bleed  -Nondiagnostic EGD and flex sigmoidoscopy and now undergoing capsule endoscopy per GI    Pancreatic cancer s/p Whipple's    Other comorbidities as below        PLAN  -Cont  Keflex and complete total 7 days, 3 more days  -Monitor labs  -GI bleed workup per others  -Monitor for any ADE  -Discussed with Dr. Tawnya Crook, MD, FIDSA  May 23, 2022                                                               INFECTIOUS DISEASE FOLLOW UP NOTE     Requested by: Dr. Ninetta Lights     Reason for consult: bacteremia     Date of admission: 05/16/2022     Date of consult: May 19, 2022      ABX:      Current abx Prior abx    Keflex 3/27-1, antibiotic 4 of 7  Vanco 3/24-3      ASSESSMENT:       BSI-->Strep Viridans /Alpha hemolytic strep-now identified as leuconostoc  -3/21 admission BC x 2 1/2 strep viridans, 1/2 GPC pairs, chains, clusters but now both sets identified as leuconostoc  -Repeat BC x 2 ngtd  -Afebrile with no elevation in WBC  -2D echo without valvular vegetation  -Patient does have hardware in his neck and with chronic neck pain and without any concern  -Doubt true infection and suspect more contaminant versus translocation secondary to GI bleed   GI bleed  -Recurrent rectal bleeding  -H&H nadir  6.2 and 19.1  -Status post transfusions  -3/22 EGD normal  -3/22 flex sig with old blood in rectal vault poor visualization  -Plan for capsule endoscopy   Recent hospitalization (3/14-3/17/2024) for GI bleed   Pancreatic cancer status post Whipple 2006  -Status post chemo/radiation   CAD-->CABG   History of nephrolithiasis  -June 2020 cystoscopy/laser lithotripsy  -01/25/2022 CTAP tiny nonobstructing bilateral renal calculi   History of COVID November 2020  -Treated with remdesivir, convalescent plasma, Decadron   History of strep pneumonia November 2020   Allergies: Amoxicillin (diarrhea)            RECOMMENDATIONS:      -Continue p.o. Keflex and plan total of 7 days with stop date 05/26/2022  -PillCam study started this morning  -GI following  -Repeat blood culture x 2 NGTD  -Monitor for any ADE  -Avoid nephrotoxic medication  -Reviewed clinical data and discussed plan of care as  outlined by Dr. Shearon Stalls    MICROBIOLOGY:   PREVIOUS CULTURE AND ID DATA  2020  4/27     UCx E. Coli  5/20     UCx E. Coli  6/22     UCx E. Coli  7/2       UCx E. coli  11/15   urine culture Klebsiella pneumonia, E. Coli              COVID 19+              Strep pneumo urine antigen positive     CURRENT CULTURE AND ID DATA  3/21     BC x 2--> Leuconostoc species  3/24     BC x 2 NGTD    LINES AND CATHETERS:   PIV     SUMMARY:     [name] is a 77 year old gentleman with history significant for HTN, CAD status post CABG, pancreatic cancer diagnosed 2006 status post Whipple, radiation, chemo, nephrolithiasis with previous lithotripsy and stent placement.     Patient was recently hospitalized [facility] 3/14-3/17/2024 having presented with blood in stools associated with fatigue and lightheadedness.  He was found to have maroon-colored stools.  No anticoagulation with the exception of daily aspirin which was held on admission but eventually restarted prior to discharge..  GI was consulted and recommended CTA if recurrent large bleed and suspected diverticular bleed admitting hemoglobin was 10.6 but during hospitalization remained stable with nadir of 8.2.     Presented to [facility] ER 3/21 with rectal bleeding.  Initial H&H was stable 8 and 25 but dropped to 6 and 19 requiring transfusions.  CTA and of abdomen and pelvis negative for active GI bleed evidence of diverticulosis with multiple hepatic and renal hypodensities small and too numerous to characterize as well as nonobstructing left nephrolithiasis.  EGD was done on 3/22 which was normal.  Revealed old blood but could not evaluate mucosa.  Serial H&H's have been followed and most recent is 9.3 and 28.1.       Pt denies having fevers or chills prior to admission.  No sick contacts.  No mediport, joint replacements, stents. States he was recently seen in cardiology office and had ultrasound of his heart.      Blood cultures 2 of 2 from admission with strep viridans and 1/2  alphahemolytic strep and later identified as Leuconostoc.  Repeat blood cultures negative and echo without any valve vegetation.  Antibiotics switched to Keflex   SUBJECTIVE :     Interval notes reviewed.  Afebrile, vital signs stable  No new labs or imaging today  PillCam test started this morning  No further rectal bleeding  Overall feels well and anxious to go home  Denies shortness of breath, cough, sputum production  No abdominal pain  OBJECTIVE     BP 109/72   Pulse 76   Temp 97.5 F (36.4 C) (Temporal)   Resp 18   Ht 1.676 m (5\' 6" )   Wt 58.6 kg (129 lb 3 oz)   SpO2 99%   BMI 20.85 kg/m     Temp (24hrs), Avg:97.6 F (36.4 C), Min:97.2 F (36.2 C), Max:98.1 F (36.7 C)    General: No apparent distress, stable vitals  HEENT: Normocephalic, mucous membranes pink, moist, no oral lesions, no thrush.  Broken tooth noted posterior right lower jaw does not appear to abscess  Neck: Supple, no lymphadenopathy, bilateral carotid bruits  Chest: Symmetrical expansion  Lungs: Clear to auscultation bilaterally  Heart:. S1-S2 regular without S3; 2/6 systolic murmur  Abdomen: Flat, nontender, active bowel sounds.  Abdomen with black belt for PillCam study  Musculoskeletal: No lower extremity edema  CNS: Awake, alert, oriented x 3.  Smile symmetric tongue midline  SKIN: No rash no jaundice    MEDICATIONS:     Current Facility-Administered Medications   Medication Dose Route Frequency Provider Last Rate Last Admin    gabapentin (NEURONTIN) capsule 600 mg  600 mg Oral 4x daily Simoes, Ruchita, MD   600 mg at 05/23/22 0817    cephALEXin (KEFLEX) capsule 500 mg  500 mg Oral 4 times per day Tobin Chad, MD   500 mg at 05/23/22 0542    diclofenac sodium (VOLTAREN) 1 % gel 2 g  2 g Topical BID Simoes, Ruchita, MD   2 g at 05/23/22 0819    traMADol (ULTRAM) tablet 50 mg  50 mg Oral Q6H PRN Knox Saliva, MD   50 mg at 05/21/22 0232    hydrocortisone (ANUSOL-HC) suppository 25 mg  25 mg Rectal BID Simoes, Ruchita, MD    25 mg at 05/22/22 2017    hydrocortisone (ANUSOL-HC) suppository 25 mg  25 mg Rectal BID PRN Doscher, Vickie L, APRN - NP        acetaminophen (TYLENOL) tablet 650 mg  650 mg Oral Q6H PRN Matriano, Crista Curb, MD   650 mg at 05/22/22 0921    0.9 % sodium chloride infusion   IntraVENous PRN Levasseur, Isabel Caprice, MD        pantoprazole (PROTONIX) 40 mg in sodium chloride (PF) 0.9 % 10 mL injection  40 mg IntraVENous Q12H Matriano, Crista Curb, MD   40 mg at 05/23/22 0005    naloxone [facility]) injection 0.4 mg  0.4 mg IntraVENous PRN Illene Regulus, MD        ondansetron [facility]) injection 4 mg  4 mg IntraVENous Q6H PRN Matriano, Crista Curb, MD   4 mg at 05/21/22 1423    melatonin tablet 3 mg  3 mg Oral Nightly PRN Illene Regulus, MD   3 mg at 05/21/22 2158    allopurinol (ZYLOPRIM) tablet 300 mg  300 mg Oral Daily Illene Regulus, MD   300 mg at 05/23/22 0817    lipase-protease-amylase (CREON) delayed release capsule 48,000 Units  48,000 Units Oral TID WC Illene Regulus, MD   48,000 Units at 05/23/22 0817    pravastatin (PRAVACHOL) tablet 10 mg  10 mg Oral Daily  Matriano, Crista Curb, MD   10 mg at 05/21/22 0912    tamsulosin (FLOMAX) capsule 0.8 mg  0.8 mg Oral Daily Illene Regulus, MD   0.8 mg at 05/22/22 0920    temazepam (RESTORIL) capsule 15 mg  15 mg Oral Nightly PRN Illene Regulus, MD   15 mg at 05/23/22 0004    methocarbamol (ROBAXIN) tablet 500 mg  500 mg Oral Daily PRN Illene Regulus, MD   500 mg at 05/22/22 1641         Labs: Results:   Chemistry Recent Labs     05/21/22  0652 05/22/22  0630   NA 139 136   K 4.5 5.0   CL 109* 106   CO2 23 24   BUN 15 14        CBC w/Diff Recent Labs     05/21/22  0652 05/22/22  0630   WBC 6.2 5.4   RBC 3.15* 3.27*   HGB 9.7* 9.9*   HCT 30.2* 30.9*   PLT 132* 142            RADIOLOGY :        Imaging  CTA ABDOMEN PELVIS W WO CONTRAST    Result Date: 05/18/2022  Indication: GI Bleeding. History of diverticulosis. Whipple  procedure.Marland Kitchen     IMPRESSION: No active gastrointestinal bleeding. Severe diffuse atherosclerosis. Whipple procedure. Stable hepatic hypodensities. Bilateral renal cortical atrophy. Diverticulosis. Penile implant. Diffuse spinal degenerative change. Comment: CT images of the abdomen and pelvis were obtained prior to and following the administration of intravenous contrast. MIPS projections were obtained. This was compared with May 16, 2022 and January 25, 2022.Marland Kitchen No active gastrointestinal bleeding. The lung bases are hyperaerated. Coronary calcification is observed. Atherosclerotic changes are present throughout. No aneurysm. The celiac, superior mesenteric, inferior mesenteric, iliac and common femoral arteries are patent. 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 Whipple procedure has been performed. Pancreatic head and gallbladder have been resected. Air is present in the nondependent portion of the biliary tree. The liver is of mild low attenuation from fatty infiltration. Small stable hypodensities are noted in the liver. The largest measures 1 cm in the right lobe Spleen and adrenal glands are unremarkable. Bilateral renal cortical atrophy is present with cysts. Pelvic viscera is unremarkable. Penile implant is noted. Diverticulosis is present in the colon without diverticulitis. No bowel obstruction, free intraperitoneal air, free fluid or appendicitis. Degenerative changes are present throughout the spine. Laminectomy and fusion is been performed at L5-S1. All CT exams at this facility use one or more dose reduction techniques including automatic exposure control, mA/kV adjustment per patient's size, or iterative reconstruction technique. Electronically signed by: Nelda Severe, MD 05/18/2022 3:51 PM EDT          Workstation ID: [id number]     CTA ABDOMEN PELVIS W WO CONTRAST    Result Date: 05/16/2022  DICOM format image data is available to non-affiliated  external healthcare facilities or entities on a secure, media free, reciprocally searchable basis with patient authorization for 12 months following the date of the study. Clinical History:rectal bleeding.  Examination: CTA scan of the abdomen and pelvis without and with contrast 05/16/2022 10:22 PM EDT.  5 mm noncontrast spiral scanning was performed from the lung bases to the symphysis pubis. 3 mm spiral scanning is performed with intravenous contrast in the arterial phase from the lung bases to the symphysis pubis.  3 D MIP, coronal and sagittal reconstruction images has been obtained. Correlation: 01/25/2022 FINDINGS: Lung bases are clear. Atherosclerotic calcification of the coronary arteries, abdominal aorta and mesenteric arteries. Degenerative changes of the spine. Penile implant. Pneumobilia likely related to sphincterotomy. Gallbladder is absent. Multiple small hepatic and renal hypodensities are too small to characterize. There are too numerous to characterize renal hypodensities. Cyst with wall calcification lower pole left kidney. Spleen, pancreatic tail is unremarkable, status post Whipple's. Adrenal glands and bladder are unremarkable. Prostate is enlarged. There are colonic diverticuli. Terminal ileum, appendix, stomach and small bowel loops are within normal limits. Dense atherosclerotic calcification of the abdominal aorta, mesenteric, renal and iliac arteries. No lymph node enlargement or free fluid. Nonobstructing 4 mm left nephrolithiasis.     IMPRESSION: 1. No active GI bleed. 2. Diverticulosis. 3. Status post Whipple's procedure. 4. Multiple hepatic and renal hypodensities some small and too numerous to characterize. 5. 4 mm nonobstructing left nephrolithiasis. Electronically signed by: Kathlene Cote, MD 05/16/2022 10:34 PM EDT          Workstation ID: [id number]        I have independently reviewed lab studies and imgaing as well as review of nursing notes and physican notes from the past 24  hours.    Dragon medical dictation software was used for portions of this report. Unintended errors may occur.     Wylene Men, APRN - NP  May 23, 2022    [facility] Infectious Disease   Office [place]

## 2022-05-23 NOTE — Progress Notes (Signed)
Patient is feeling well.  He had multiple bowel movements, all of which were brown.  No bleeding.  He is eager to go home.  He is getting PillCam study today.  Hemoglobin is stable.    Discussed with gastroenterology NP.  Plan to discharge home tonight after PillCam study is completed.  Hold off aspirin 81 mg for 1 week, after which it can be resumed if there is no further bleeding.  Patient will need follow-up with gastroenterology for PillCam study results.  Full discharge summary to follow.

## 2022-05-23 NOTE — Plan of Care (Signed)
Problem: Gastrointestinal - Adult  Goal: Maintains or returns to baseline bowel function  05/23/2022 1208 by Joetta Manners, RN  Outcome: Progressing  05/22/2022 2314 by Delbert Phenix D, RN  Outcome: Progressing  Flowsheets (Taken 05/22/2022 2312)  Maintains or returns to baseline bowel function:   Assess bowel function   Encourage oral fluids to ensure adequate hydration  05/22/2022 2312 by Dizon, Carylle D, RN  Outcome: Progressing  Flowsheets (Taken 05/22/2022 2312)  Maintains or returns to baseline bowel function:   Assess bowel function   Encourage oral fluids to ensure adequate hydration     Problem: Gastrointestinal - Adult  Goal: Maintains adequate nutritional intake  05/23/2022 1208 by Joetta Manners, RN  Outcome: Progressing  05/22/2022 2314 by Delbert Phenix D, RN  Outcome: Progressing  Flowsheets (Taken 05/22/2022 2312)  Maintains adequate nutritional intake: Identify factors contributing to decreased intake, treat as appropriate  05/22/2022 2312 by Dizon, Bradly Chris D, RN  Outcome: Progressing  Flowsheets (Taken 05/22/2022 2312)  Maintains adequate nutritional intake: Identify factors contributing to decreased intake, treat as appropriate

## 2022-05-23 NOTE — Progress Notes (Signed)
Purpose of the pill cam procedure, positioning, length of procedure, and explanation of patient expectations discussed. Patient verbalizes understanding of the procedure.     Pill cam recorder and sensor belt place on the patient.  The patient was able to swallow the pill cam capsule without difficulty.    Blue indicator light on.

## 2022-05-23 NOTE — Progress Notes (Signed)
GI Progress Note    Patient:  Tommy Brown  Date of Birth:  [birth year]  Age: 77 y.o.  Sex: male  PCP: Daralene Milch, MD  MRN: [id number]                    Admit Date: 05/16/2022    Assessment:   GI bleed.  Painless hematochezia.  Likely diverticular bleed.  CTA neg for evidence of active bleeding. EGD 05/17/22 normal. Colonoscopy 2022 with diverticulosis.  CTA abd/pelvis 05/18/22 with no active GI bleeding. No further bleeding   Acute anemia secondary to #1.  Hgb 9.7 this AM.  Dysphagia with solids  Pancreatic cancer s/p whipple  AKI  2/2 BSI viridans   HTN/HLD  CAD s/p CABG, daily ASA  BPH  Chronic neuropathy  Gout     EGD 05/17/22  Normal EGD   Flex sig 05/17/22  Large amount of old blood and unable to adequately visualize the colonic mucosa.  Scope not advanced beyond the sigmoid colon at 20cm.  No active bleeding seen  Active Hospital Problems    Diagnosis Date Noted    Rectal bleeding [K62.5] 05/09/2022     Plan:   Trend h/h--h/h for today pending   Monitor stools  Anusol  Diet as per M2A protocol   M2A underway-results wont be avail until tomorrow afternoon... pt asking to go home today after testing-> ok from GI perspective, no active bleeding now     Subjective:   Stools w/ Golytely brown, no bleeding  No n/v or abd pain  Wants to go home today, [place] game on tonight at 9:30 and he wants to watch at home   Objective:   Physical Exam:  BP 109/72   Pulse 76   Temp 97.5 F (36.4 C) (Temporal)   Resp 18   Ht 1.676 m (5\' 6" )   Wt 58.6 kg (129 lb 3 oz)   SpO2 99%   BMI 20.85 kg/m    Temp (24hrs), Avg:97.6 F (36.4 C), Min:97.2 F (36.2 C), Max:98.1 F (36.7 C)      Intake/Output Summary (Last 24 hours) at 05/23/2022 0913  Last data filed at 05/22/2022 1642  Gross per 24 hour   Intake 850 ml   Output --   Net 850 ml     Current Facility-Administered Medications   Medication Dose Route Frequency Provider Last Rate Last Admin    gabapentin (NEURONTIN) capsule 600 mg  600 mg Oral  4x daily Simoes, Ruchita, MD   600 mg at 05/23/22 0817    cephALEXin (KEFLEX) capsule 500 mg  500 mg Oral 4 times per day Tobin Chad, MD   500 mg at 05/23/22 0542    diclofenac sodium (VOLTAREN) 1 % gel 2 g  2 g Topical BID Simoes, Ruchita, MD   2 g at 05/23/22 0819    traMADol (ULTRAM) tablet 50 mg  50 mg Oral Q6H PRN Knox Saliva, MD   50 mg at 05/21/22 0232    hydrocortisone (ANUSOL-HC) suppository 25 mg  25 mg Rectal BID Simoes, Ruchita, MD   25 mg at 05/22/22 2017    hydrocortisone (ANUSOL-HC) suppository 25 mg  25 mg Rectal BID PRN Doscher, Vickie L, APRN - NP        acetaminophen (TYLENOL) tablet 650 mg  650 mg Oral Q6H PRN Matriano, Crista Curb, MD   650 mg at 05/22/22 0921    0.9 % sodium chloride infusion  IntraVENous PRN Jordan Likes, MD        pantoprazole (PROTONIX) 40 mg in sodium chloride (PF) 0.9 % 10 mL injection  40 mg IntraVENous Q12H Matriano, Crista Curb, MD   40 mg at 05/23/22 0005    naloxone [facility]) injection 0.4 mg  0.4 mg IntraVENous PRN Illene Regulus, MD        ondansetron [facility]) injection 4 mg  4 mg IntraVENous Q6H PRN Illene Regulus, MD   4 mg at 05/21/22 1423    melatonin tablet 3 mg  3 mg Oral Nightly PRN Illene Regulus, MD   3 mg at 05/21/22 2158    allopurinol (ZYLOPRIM) tablet 300 mg  300 mg Oral Daily Illene Regulus, MD   300 mg at 05/23/22 0817    lipase-protease-amylase (CREON) delayed release capsule 48,000 Units  48,000 Units Oral TID WC Illene Regulus, MD   48,000 Units at 05/23/22 [id number]    pravastatin (PRAVACHOL) tablet 10 mg  10 mg Oral Daily Illene Regulus, MD   10 mg at 05/21/22 0912    tamsulosin (FLOMAX) capsule 0.8 mg  0.8 mg Oral Daily Illene Regulus, MD   0.8 mg at 05/22/22 0920    temazepam (RESTORIL) capsule 15 mg  15 mg Oral Nightly PRN Illene Regulus, MD   15 mg at 05/23/22 0004    methocarbamol (ROBAXIN) tablet 500 mg  500 mg Oral Daily PRN Illene Regulus, MD   500 mg at 05/22/22  1641        Physical Exam:  General Appearance: alert and oriented to person, no acute distress  Pulmonary/Chest: non-labored on RA  Cardiovascular: regular + murmur   ABD: soft, ND, NT, NABS  DATA    CBC w/Diff   Lab Results   Component Value Date/Time    WBC 5.4 05/22/2022 06:30 AM    WBC 6.2 05/21/2022 06:52 AM    WBC 4.6 05/20/2022 06:53 AM    RBC 3.27 (L) 05/22/2022 06:30 AM    RBC 3.15 (L) 05/21/2022 06:52 AM    RBC 3.03 (L) 05/20/2022 06:53 AM    HGB 9.9 (L) 05/22/2022 06:30 AM    HGB 9.7 (L) 05/21/2022 06:52 AM    HGB 9.6 (L) 05/20/2022 06:53 AM    HCT 30.9 (L) 05/22/2022 06:30 AM    HCT 30.2 (L) 05/21/2022 06:52 AM    HCT 29.2 (L) 05/20/2022 06:53 AM    MCV 94.5 05/22/2022 06:30 AM    MCV 95.9 05/21/2022 06:52 AM    MCV 96.4 05/20/2022 06:53 AM    MCH 30.3 05/22/2022 06:30 AM    MCH 30.8 05/21/2022 06:52 AM    MCH 31.7 05/20/2022 06:53 AM    MCHC 32.0 05/22/2022 06:30 AM    MCHC 32.1 05/21/2022 06:52 AM    MCHC 32.9 05/20/2022 06:53 AM    RDW 56.0 (H) 05/22/2022 06:30 AM    RDW 59.2 (H) 05/21/2022 06:52 AM    RDW 57.6 (H) 05/20/2022 06:53 AM    PLT 142 05/22/2022 06:30 AM    PLT 132 (L) 05/21/2022 06:52 AM    PLT 122 (L) 05/20/2022 06:53 AM    MPV 10.6 (H) 05/22/2022 06:30 AM    MPV 10.8 (H) 05/21/2022 06:52 AM    MPV 10.6 (H) 05/20/2022 06:53 AM     Lab Results   Component Value Date/Time    MONOS 10.8 05/22/2022 06:30  AM    MONOS 8.3 05/21/2022 06:52 AM    MONOS 8.6 05/20/2022 06:53 AM        Recent Labs     05/21/22  0652 05/22/22  0630   NA 139 136   K 4.5 5.0   CL 109* 106   CO2 23 24   BUN 15 14   CREATININE 1.16 1.27   GLUCOSE 90 100   CALCIUM 8.6* 8.8     Basic Metabolic Profile   Recent Labs     05/21/22  0652 05/22/22  0630   NA 139 136   K 4.5 5.0   CL 109* 106   CO2 23 24   BUN 15 14        Lab Results   Component Value Date/Time    LABALBU 2.5 05/17/2022 06:41 AM       Radiology Results  CTA ABDOMEN PELVIS W WO CONTRAST    Result Date: 05/16/2022  DICOM format image data is available to  non-affiliated external healthcare facilities or entities on a secure, media free, reciprocally searchable basis with patient authorization for 12 months following the date of the study. Clinical History:rectal bleeding.  Examination: CTA scan of the abdomen and pelvis without and with contrast 05/16/2022 10:22 PM EDT.  5 mm noncontrast spiral scanning was performed from the lung bases to the symphysis pubis. 3 mm spiral scanning is performed with intravenous contrast in the arterial phase from the lung bases to the symphysis pubis. 3 D MIP, coronal and sagittal reconstruction images has been obtained. Correlation: 01/25/2022 FINDINGS: Lung bases are clear. Atherosclerotic calcification of the coronary arteries, abdominal aorta and mesenteric arteries. Degenerative changes of the spine. Penile implant. Pneumobilia likely related to sphincterotomy. Gallbladder is absent. Multiple small hepatic and renal hypodensities are too small to characterize. There are too numerous to characterize renal hypodensities. Cyst with wall calcification lower pole left kidney. Spleen, pancreatic tail is unremarkable, status post Whipple's. Adrenal glands and bladder are unremarkable. Prostate is enlarged. There are colonic diverticuli. Terminal ileum, appendix, stomach and small bowel loops are within normal limits. Dense atherosclerotic calcification of the abdominal aorta, mesenteric, renal and iliac arteries. No lymph node enlargement or free fluid. Nonobstructing 4 mm left nephrolithiasis.     IMPRESSION: 1. No active GI bleed. 2. Diverticulosis. 3. Status post Whipple's procedure. 4. Multiple hepatic and renal hypodensities some small and too numerous to characterize. 5. 4 mm nonobstructing left nephrolithiasis. Electronically signed by: Kathlene Cote, MD 05/16/2022 10:34 PM EDT          Workstation ID: [id number]  Cell # [id number]  Office # [phone]  [address] [place], [place]

## 2022-05-23 NOTE — Discharge Summary (Addendum)
Discharge Summary     Patient ID:  Tommy Brown, 77 y.o., male  DOB: August 14, 1945    Admit Date: 05/16/2022  Discharge Date:  05/23/2022  Length of stay: 7 day(s)    PCP:  Daralene Milch, MD    Chief Complaint   Patient presents with    Rectal Bleeding       Patient Active Problem List   Diagnosis    UTI (urinary tract infection)    History of syncope    AKI (acute kidney injury) ([place])    Hypertensive retinopathy, bilateral    Hx of CABG    Intraductal papillary mucinous neoplasm    Vitreous degeneration, left eye    Clostridium difficile diarrhea    Syncope    Atherosclerosis of coronary artery    Chest pain    H/O neck surgery    Palpitations    Bacteremia due to Klebsiella pneumoniae    Essential hypertension    Coronary artery disease (CAD) excluded    Pneumonia    Testosterone deficiency    Cervical disc disease    Port-A-Cath in place    AR (allergic rhinitis)    Acute chest pain    Metabolic acidosis    Murmur, cardiac    Sepsis ([place])    Liver cyst    BPH (benign prostatic hyperplasia)    Carotid artery disease (HCC)    Seasonal allergies    Malignant neoplasm of head of pancreas (HCC)    Hypercholesteremia    Puckering of macula, right eye    Tributary (branch) retinal vein occlusion, right eye, stable    Dyslipidemia    Ejection fraction < 50%    Diarrhea    Recurrent nephrolithiasis    Preop cardiovascular exam    Gout    Steatorrhea    Pneumonia due to COVID-19 virus    History of coronary artery disease    Rectal bleeding    Lower GI bleed        Discharge Diagnosis:     Acute painless hematochezia - suspect diverticular bleeding  Acute on chronic anemia due to GI bleeding  Blood stream infection - leukonostoc  Recent hospitalization from 05/09/22 to 05/12/22 for lower GI bleeding  CKD III A  CAD s/p CABG  Hypertension  dyslipidemia  Pancreatic cancer s/p Whipple's procedure  BPH  Strep viridans blood stream infection  Moderate aortic stenosis  Chronic neck pain on gabapentin      Hospital  Course:      Patient was recently discharged on 05/12/22 - presented to the ER on 05/16/22 for rectal bleeding   He was admitted to step down unit    - CTA  abdomen 05/16/22  - no active GI bleed.  + diverticulosis  - S/p EGD 05/17/22 - normal  - Flex sigmoidoscopy 05/17/22 - Large amount of old blood and unable to adequately visualize the colonic mucosa.  Scope not advanced beyond the sigmoid colon at 20cm.  No active bleeding seen  - CTA repeated on 05/18/22 for bleeding - no source or active GI bleed was found  -patient continued to have on and off minor GI bleeding after procedure. Eventually bleeding stopped.  He has been started on anusol suppository and continue PPI  - pillcam study was done on 05/23/22- results awaited     Hemoglobin went down to 6.2 - s/p 3 units PRBC transfusion   Hemoglobin is stable 9.7     Blood cultures from 05/16/22 are  positive for Leuconostoc  Blood cultures from 3/24 - neg to date  ECHO 05/20/22 with EF 49%, moderate aortic stenosis, AVA 0.54 cm 2, no significant change since 06/2019  -Discussed with ID.  Patient has been on IV vancomycin, which will be stopped today, switch to Keflex to complete total 7 days.    Patient is feeling better. Hemoglobin is stable. Last few bowel movements have been non bloody. He is medically stable for discharge and has been cleared for discharge by GI and ID team    Discharge instructions:    Discussed with patient to return to ER immediately in case of recurrent bleeding  Will need CBC BMP check in one week  Hold off aspirin for one week ( per GI ) - resume in one week if no further bleeding  Pillcam study results are pending. Will need follow up with GI team    Follow-up:       Follow-up Information     Deguzman-Berube, Aundra Dubin, MD. Schedule an appointment as soon as   possible for a visit in 1 week(s).    Specialty: Family Medicine  Why: CHECK CBC BMP IN ONE WEEK  Contact information:  [address] [place]  [id number]  [phone]             Polo Riley, MD. Schedule an appointment as soon as possible for a   visit in 1 week(s).    Specialty: Gastroenterology  Why: FOR PILL CAM STUDY RESULTS  Contact information:  [address]  [place] [address]  [phone]                         HPI on Admission (per admitting physician):  Elishua Klempner Sr is a 77 y.o. year old male who presents with  rectal bleeding     Patient very pleasant 77 year old male history of rectal bleeding, GI bleeding, chronic anemia, chronic kidney disease, coronary artery disease, pancreatic cancer, status post Whipple, diverticulosis, comes in with rectal bleeding.  Patient admitted last week in hospital for rectal bleeding, patient reports after he was admitted there were no further bleeding and patient was discharged.  Patient   today had 2 episodes of rectal bleeding at home, presented in the emergency room, had 2 more episodes of rectal bleeding in ER.  Hemoglobin 8.2.  Baseline hemoglobin ranging 8.2-8.9 last week.  No abdominal pain.  No chest pain.  No hematemesis.        Condition at discharge:  stable    Disposition: home with family support    Code status : full code      Consultants:    Gastroenterology  Infectious disease    Physical Exam on Discharge:  BP 118/60   Pulse 70   Temp 99.1 F (37.3 C) (Temporal)   Resp 18   Ht 1.676 m (5\' 6" )   Wt 58.6 kg (129 lb 3 oz)   SpO2 95%   BMI 20.85 kg/m   Gen - AAOx3, NAD  HEENT -  mucous membranes moist  Neck - supple, no JVD  Cardiac - RRR, S1, S2, 2/6 systolic murmur  Chest/Lungs - clear to auscultation without wheezes or rhonchi  Abdomen - soft, nontender, nondistended, normal bowel sounds  Extremities - no clubbing/ cyanosis/ edema  Neuro - CN 2-12 intact. No motor or sensory deficit.  Skin - no rashes or lesions    Discharge  medications :  Current Discharge Medication List             Details   cephALEXin (KEFLEX) 500 MG capsule Take 1 capsule by mouth  4 times daily for 3 days  Qty: 12 capsule, Refills: 0      hydrocortisone (ANUSOL-HC) 25 MG suppository Place 1 suppository rectally 2 times daily  Qty: 14 suppository, Refills: 0                Details   aspirin 81 MG chewable tablet Take 1 tablet by mouth daily RESUME THIS AFTER ONE WEEK, ONLY IF NO MORE BLEEDING IN BOWEL MOVEMENTS  Qty: 30 tablet, Refills: 3                Details   bumetanide (BUMEX) 1 MG tablet Take 1 tablet by mouth daily Resume on 05/14/2022  Qty: 30 tablet, Refills: 3      omeprazole (PRILOSEC) 40 MG delayed release capsule Take 1 capsule by mouth daily      methocarbamol (ROBAXIN) 500 MG tablet Take 1 tablet by mouth daily as needed      acetaminophen (TYLENOL) 325 MG tablet Take 2 tablets by mouth every 6 hours as needed      allopurinol (ZYLOPRIM) 300 MG tablet Take 1 tablet by mouth daily      gabapentin (NEURONTIN) 600 MG tablet Take 2 tablets by mouth 4 times daily.      metoprolol succinate (TOPROL XL) 25 MG extended release tablet Take 1 tablet by mouth daily      lipase-protease-amylase (CREON) 24000-76000 units delayed release capsule Take 2 capsules by mouth 3 times daily (with meals)      tamsulosin (FLOMAX) 0.4 MG capsule Take 2 capsules by mouth daily      temazepam (RESTORIL) 15 MG capsule Take 1 capsule by mouth nightly as needed for Anxiety or Sleep.            Current Discharge Medication List        CONTINUE these medications which have NOT CHANGED    Details   bumetanide (BUMEX) 1 MG tablet Take 1 tablet by mouth daily Resume on 05/14/2022  Qty: 30 tablet, Refills: 3      omeprazole (PRILOSEC) 40 MG delayed release capsule Take 1 capsule by mouth daily      methocarbamol (ROBAXIN) 500 MG tablet Take 1 tablet by mouth daily as needed      acetaminophen (TYLENOL) 325 MG tablet Take 2 tablets by mouth every 6 hours as needed      allopurinol (ZYLOPRIM) 300 MG tablet Take 1 tablet by mouth daily      gabapentin (NEURONTIN) 600 MG tablet Take 2 tablets by mouth 4 times daily.       metoprolol succinate (TOPROL XL) 25 MG extended release tablet Take 1 tablet by mouth daily      lipase-protease-amylase (CREON) 24000-76000 units delayed release capsule Take 2 capsules by mouth 3 times daily (with meals)      tamsulosin (FLOMAX) 0.4 MG capsule Take 2 capsules by mouth daily      temazepam (RESTORIL) 15 MG capsule Take 1 capsule by mouth nightly as needed for Anxiety or Sleep.            Current Discharge Medication List        STOP taking these medications       amLODIPine (NORVASC) 5 MG tablet Comments:   Reason for Stopping: normotensive  pravastatin (PRAVACHOL) 10 MG tablet Comments:   Reason for Stopping: not a current medication per patient                   Most Recent Labs:  Recent Results (from the past 24 hour(s))   Hemoglobin and Hematocrit    Collection Time: 05/23/22 10:13 AM   Result Value Ref Range    Hemoglobin 10.2 (L) 13.0 - 18.0 gm/dl    Hematocrit 31.2 (L) 36.0 - 55.0 %       IMAGING RESULTS  CTA ABDOMEN PELVIS W WO CONTRAST    Result Date: 05/18/2022  Indication: GI Bleeding. History of diverticulosis. Whipple procedure.Marland Kitchen     IMPRESSION: No active gastrointestinal bleeding. Severe diffuse atherosclerosis. Whipple procedure. Stable hepatic hypodensities. Bilateral renal cortical atrophy. Diverticulosis. Penile implant. Diffuse spinal degenerative change. Comment: CT images of the abdomen and pelvis were obtained prior to and following the administration of intravenous contrast. MIPS projections were obtained. This was compared with May 16, 2022 and January 25, 2022.Marland Kitchen No active gastrointestinal bleeding. The lung bases are hyperaerated. Coronary calcification is observed. Atherosclerotic changes are present throughout. No aneurysm. The celiac, superior mesenteric, inferior mesenteric, iliac and common femoral arteries are patent. 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 Whipple procedure has been performed. Pancreatic head and  gallbladder have been resected. Air is present in the nondependent portion of the biliary tree. The liver is of mild low attenuation from fatty infiltration. Small stable hypodensities are noted in the liver. The largest measures 1 cm in the right lobe Spleen and adrenal glands are unremarkable. Bilateral renal cortical atrophy is present with cysts. Pelvic viscera is unremarkable. Penile implant is noted. Diverticulosis is present in the colon without diverticulitis. No bowel obstruction, free intraperitoneal air, free fluid or appendicitis. Degenerative changes are present throughout the spine. Laminectomy and fusion is been performed at L5-S1. All CT exams at this facility use one or more dose reduction techniques including automatic exposure control, mA/kV adjustment per patient's size, or iterative reconstruction technique. Electronically signed by: Nelda Severe, MD 05/18/2022 3:51 PM EDT          Workstation ID: [id number]     CTA ABDOMEN PELVIS W WO CONTRAST    Result Date: 05/16/2022  DICOM format image data is available to non-affiliated external healthcare facilities or entities on a secure, media free, reciprocally searchable basis with patient authorization for 12 months following the date of the study. Clinical History:rectal bleeding.  Examination: CTA scan of the abdomen and pelvis without and with contrast 05/16/2022 10:22 PM EDT.  5 mm noncontrast spiral scanning was performed from the lung bases to the symphysis pubis. 3 mm spiral scanning is performed with intravenous contrast in the arterial phase from the lung bases to the symphysis pubis. 3 D MIP, coronal and sagittal reconstruction images has been obtained. Correlation: 01/25/2022 FINDINGS: Lung bases are clear. Atherosclerotic calcification of the coronary arteries, abdominal aorta and mesenteric arteries. Degenerative changes of the spine. Penile implant. Pneumobilia likely related to sphincterotomy. Gallbladder is absent. Multiple small  hepatic and renal hypodensities are too small to characterize. There are too numerous to characterize renal hypodensities. Cyst with wall calcification lower pole left kidney. Spleen, pancreatic tail is unremarkable, status post Whipple's. Adrenal glands and bladder are unremarkable. Prostate is enlarged. There are colonic diverticuli. Terminal ileum, appendix, stomach and small bowel loops are within normal limits. Dense atherosclerotic calcification of the abdominal aorta, mesenteric, renal  and iliac arteries. No lymph node enlargement or free fluid. Nonobstructing 4 mm left nephrolithiasis.     IMPRESSION: 1. No active GI bleed. 2. Diverticulosis. 3. Status post Whipple's procedure. 4. Multiple hepatic and renal hypodensities some small and too numerous to characterize. 5. 4 mm nonobstructing left nephrolithiasis. Electronically signed by: Kathlene Cote, MD 05/16/2022 10:34 PM EDT          Workstation ID: [id number]          Total discharge time 44 minutes.      Perfecto Kingdom, MD  [facility]  May 23, 2022  3:40 PM

## 2022-05-25 LAB — CULTURE, BLOOD 1: BLOOD CULTURE RESULT: NO GROWTH

## 2022-05-25 LAB — CULTURE, BLOOD 2: BLOOD CULTURE RESULT: NO GROWTH

## 2022-08-15 ENCOUNTER — Inpatient Hospital Stay: Admit: 2022-08-15 | Payer: MEDICARE | Primary: Family Medicine

## 2022-08-15 ENCOUNTER — Encounter

## 2022-08-15 DIAGNOSIS — M542 Cervicalgia: Secondary | ICD-10-CM

## 2022-08-15 DIAGNOSIS — M50323 Other cervical disc degeneration at C6-C7 level: Secondary | ICD-10-CM

## 2022-08-20 ENCOUNTER — Ambulatory Visit: Admit: 2022-08-20 | Payer: MEDICARE | Primary: Family Medicine

## 2022-08-20 DIAGNOSIS — N200 Calculus of kidney: Secondary | ICD-10-CM

## 2022-08-20 LAB — AMB POC URINALYSIS DIP STICK AUTO W/O MICRO
Bilirubin, Urine, POC: NEGATIVE
Blood, Urine, POC: NEGATIVE
Glucose, Urine, POC: NEGATIVE
Ketones, Urine, POC: NEGATIVE
Leukocyte Esterase, Urine, POC: NEGATIVE
Nitrite, Urine, POC: NEGATIVE
Protein, Urine, POC: NEGATIVE
Specific Gravity, Urine, POC: 1.015 — AB (ref 1.001–1.035)
Urobilinogen, POC: 0.2
pH, Urine, POC: 5.5 (ref 4.6–8.0)

## 2022-08-20 NOTE — Telephone Encounter (Signed)
[name] has an order for RUS      ALL MALE PATIENTS NEEDING AN MRI OF PELVIS OR PROSTATE, MUST BE SCHEDULED AT ONE OF THE FOLLOWING:    **MRI/CT (Preferred [facility])  **[facility] (Preferred [facility])  **[facility] (Preferred [facility])  [facility]  [facility]  [facility]      To be done at  [facility]    Needed by:  NOW    Patient has a follow-up appointment:  Yes     If MRI, does patient have a pacemaker:   No    Order has been placed in connect care:  Yes    Is this a STAT order:  No

## 2022-08-20 NOTE — Progress Notes (Signed)
Dr. Maple Hudson is the supervising physician for this day.    Tommy Brown  DOB: 19-Oct-1945  Encounter Date: 08/20/2022       ICD-10-CM    1. Calculus of kidney  N20.0 [place] RETROPERITONEAL COMPLETE     AMB POC URINALYSIS DIP STICK AUTO W/O MICRO      2. Bilateral renal cysts  N28.1 [place] RETROPERITONEAL COMPLETE     AMB POC URINALYSIS DIP STICK AUTO W/O MICRO                   ASSESSMENT:  - Nephrolithiasis               Surgical History:               S/p URS with LL in 02/2017, for a left 8mm proximal ureteral stone with hydroureteronephrosis and 10mm LLP stone              S/p left jj stent on 06/22/19 with Dr. Tresa Endo               S/p Cystoscopy, left retrograde pyelogram, left ureteroscopy, holmium laser lithotripsy of proximal ureteral stone and extraction of left renal calculi and left double-J ureteral stent exchange on 07/31/18, string with stent removed on 08/06/18.                  Medical Therapy: Allopurinol 300 mg daily               Stone Composition: calcium oxalate 100% 07/31/18.               24 Hr Urine Panel: 09/27/19: Very low urine volume(1.92), very low urinary citrate (99), very elevated urinary sodium (324), mildly low urinary pH (5.65).      05/01/20: low urine volume (1.27), mild hyperoxaluria (45), extreme hypocitraturia (<19), low urine volume (5.513)     12/17/2021: Low urine volume (1.44), hyperoxaluria (62), extreme hypocitraturia (<22), low pH (5.547), elevated sodium (192)                     Imaging:               RUS 01/13/19: Suboptimal exam. No hydronephrosis. Bilateral renal calcifications versus nonobstructing calculi.               CT A/P 06/22/18: Multiple small (2 to 4 mm) nonobstructing bilateral renal calculi. There is a 10 x 9 mm nonobstructing calculus in the lower pole of the left kidney. Moderate left hydronephrosis and hydroureter. There is a 8 x 7 mm obstructing calculus in the proximal left ureter. No right hydronephrosis. No suspicious renal lesions.               RUS 09/08/2019:  Right kidney 9 mm, 6 mm left kidney.  No evidence of hydronephrosis.  Uncomplicated cyst.              CT A/P w/ contrast 10/28/19: KIDNEYS/URETERS: Multiple punctate (1 to 2 mm) bilateral nonobstructing calculi. Numerous bilateral simple cysts for which no follow-up is needed. No suspicious masses. No hydronephrosis.  CT Abd Pelv 02/17/20: Nonobstructing 2 to 3 mm left nephrolithiasis.   CT A/P 03/23/21 [facility]): Stable nonobstructing intrarenal calculi, left greater than right.   KM read: multiple small, nonobstructive stones bilaterally with largest in left measuring 3 mm    CT A/P 01/25/22: Tiny nonobstructing bilateral renal calculi, not significantly changed.   CTA A/P 05/16/22: Nonobstructing 4 mm left nephrolithiasis.    -  Bilateral complex renal cysts  CT A/P 03/23/21:  stable bilateral complex renal cysts  CT A/P w/wo 01/25/22: Stable complex and simple bilateral renal cysts.    - Hypogonadism, Previously managed by Dr.  Cleatis Polka, MD               Last Testosterone level was 567 ng/dL on 1/61/09.               CBC 03/25/19 reviewed with patient.               On TC 0.5 mL injections q 2 weeks as managed by PCP.      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            PLAN:   UA today negative  Continue to aim for urine output between 2-2.5 L daily, limit oxalates in diet, increase dietary citrate (lemons or limes in water, Crystal Lite lemonade, low cal OJ), limit daily sodium intake to <2300 mg total.  RUS re-ordered to be done now. Will notify.   Follow up pending RUS results. If stable, plan to see back in 6 months.     DISCUSSION: We discussed dietary methods for stone prevention including the following: increased water intake to 2-3 liters per day, add lemon or lemon concentrate to water or Crystal Lite  lemonade to increase citrate which is beneficial for stone prevention, limiting dietary sodium to less than 2300 mg per day, limiting animal protein 8 oz or less daily, and limiting foods high in oxalate content  (spinach, beans, chocolate, etc.).        Chief Complaint   Patient presents with    Nephrolithiasis       HISTORY OF PRESENT ILLNESS:   Tommy Brown is a 77 y.o. male who presents today in follow up for an established diagnosis of kidney stones and complex renal cysts.  He is receiving long-term medical care for these conditions.    Patient doing well. RUS was not scheduled. Does get some occasional right flank pains that are fleeting. Does not have to take medications for the pains. Denies any interval stone events. Denies flank pain, gross hematuria, dysuria, asymptomatic for infection. No f/c/n/v.      He is still taking Allopurinol daily for gout. He is not taking [place]. Drinks at least 5 water bottles daily.    He has been taking hemp with benefit.  He has been able to ween off gabapentin.     Was hospitalized 2x since last visit for GI bleed. Following with GI. Just completed pill cam.     Prior Hx:  Denies IBS, IBD, gastric bypass  Denies Topamax use  Patient with gout, on Allopurinol 300 mg daily     Denies CKD  Denies family history of CKD    Denies any voiding concerns today. Urinating well. Good FOS.      Previously on TC injections q 2 weeks via his PCP. He was switched to Androgel but his insurance would not cover the medication. Currently injecting 0.5 mL TC q 2 weeks. Overall happy with  this management.      S/p IPP placement. Not functional. Not sexually active at this time.         H/o pancreatic cancer, h/o of gout, on Allopurinol daily.       Past Medical History:   Diagnosis Date    Arthritis     Benign prostate hyperplasia     Bladder infection     Chronic kidney disease     Coronary arteriosclerosis     Coronary atherosclerosis of artery bypass graft     Decreased testosterone  level     Diverticular disease     Essential hypertension     Gout     History of acute renal failure     History of anemia     History of malignant neoplasm of pancreas     Hx of CABG     Kidney stone     Kidney stones     Neuropathy     NECK, SHOULDERS    Pancreatic cancer (HCC)     Sepsis (HCC)      Past Surgical History:   Procedure Laterality Date    CABG, ARTERY-VEIN, FOUR  1995    CAPSULE ENDOSCOPY N/A 05/23/2022    BOWEL SMALL CAPSULE ENDOSCOPY performed by Franky Macho, MD at [facility] ENDOSCOPY    CARDIAC CATHETERIZATION  2006    CERVICAL FUSION      COLONOSCOPY N/A 07/10/2020    DIAGNOSTIC COLONOSCOPY polypectomy w/hot snare, cold snare , Clip x 1  performed by Jonna Coup, MD at [facility] ENDOSCOPY    COLONOSCOPY      COLONOSCOPY N/A 05/17/2022    COLONOSCOPY DIAGNOSTIC aborted at 25cm performed by Jonna Coup, MD at [facility] ENDOSCOPY    CORONARY ARTERY BYPASS GRAFT  1995    LUMBAR DISCECTOMY      OTHER SURGICAL HISTORY  01/01/2011    whipple procedure done for pancreatic cancer    UPPER GASTROINTESTINAL ENDOSCOPY N/A 05/17/2022    ESOPHAGOGASTRODUODENOSCOPY DIAGNOSTIC ONLY performed by Jonna Coup, MD at [facility] ENDOSCOPY    UROLOGICAL SURGERY  2012    Percutaneous extraction of a kidney stone w/ fragmentation procedure      Family History   Problem Relation Age of Onset    Emphysema Brother     Alcohol Abuse Father     Heart Disease Brother      Social History     Socioeconomic History    Marital status: Married     Spouse name: Not on file    Number of children: Not on file    Years of education: Not on file    Highest education level: Not on file   Occupational History    Not on file   Tobacco Use    Smoking status: Never    Smokeless tobacco: Never   Vaping Use    Vaping Use: Never used   Substance and Sexual Activity    Alcohol use: Not Currently    Drug use: Not Currently    Sexual activity: Defer   Other Topics Concern    Not on file   Social History Narrative    ** Merged History Encounter **           Social Determinants of Health     Financial Resource Strain: Not on file   Food Insecurity: No Food Insecurity (05/17/2022)    Hunger Vital Sign     Worried About Running Out of Food in the Last Year:  Never true     Ran Out of Food in the Last Year: Never true   Transportation Needs: No Transportation Needs (05/17/2022)    PRAPARE - Therapist, art (Medical): No     Lack of Transportation (Non-Medical): No   Physical Activity: Not on file   Stress: Not on file   Social Connections: Not on file   Intimate Partner Violence: Not on file   Housing Stability: Low Risk  (05/17/2022)    Housing Stability Vital Sign     Unable to Pay for Housing in the Last Year: No     Number of Places Lived in the Last Year: 1     Unstable Housing in the Last Year: No     Allergies   Allergen Reactions    Codeine Itching and Swelling    Oxycodone Anaphylaxis    Amoxicillin Diarrhea    Hydroxyzine Pamoate Itching and Other (See Comments)     Other reaction(s): Unknown    Meperidine Itching, Other (See Comments), Palpitations and Swelling     Other reaction(s): Unknown    Metoclopramide Itching, Other (See Comments) and Swelling     Other reaction(s): Other (See Comments)  Reaction unknown   Reaction unknown     Morphine Nausea And Vomiting and Other (See Comments)    Oxycodone-Acetaminophen Itching    Prochlorperazine Itching and Other (See Comments)     Other reaction(s): Unknown (comments)  Other reaction(s): Unknown  Unsure of reaction    Hydroxyzine Hcl Hives    Other Other (See Comments)     VISTAID  ANESTHESIA     Current Outpatient Medications   Medication Sig Dispense Refill    testosterone cypionate (DEPOTESTOTERONE CYPIONATE) 200 MG/ML injection INJECT 0.5 MILLILITER BY INTRAMUSCULAR ROUTE EVERY 2 WEEKS      Omega-3 Fatty Acids-Hemp Extra (HEMP MONOPURE PO) Take by mouth      aspirin 81 MG chewable tablet Take 1 tablet by mouth daily RESUME THIS AFTER ONE WEEK, ONLY IF NO MORE BLEEDING IN BOWEL  MOVEMENTS 30 tablet 3    omeprazole (PRILOSEC) 40 MG delayed release capsule Take 1 capsule by mouth daily      acetaminophen (TYLENOL) 325 MG tablet Take 2 tablets by mouth every 6 hours as needed      allopurinol (ZYLOPRIM) 300 MG tablet Take 1 tablet by mouth daily      gabapentin (NEURONTIN) 600 MG tablet Take 2 tablets by mouth 4 times daily.      tamsulosin (FLOMAX) 0.4 MG capsule Take 2 capsules by mouth daily      temazepam (RESTORIL) 15 MG capsule Take 1 capsule by mouth nightly as needed for Anxiety or Sleep.       No current facility-administered medications for this visit.       Physical Examination:  Resp 16   Ht 1.676 m (5\' 6" )   Wt 55.3 kg (122 lb)   BMI 19.69 kg/m   Constitutional: WDWN, Pleasant and appropriate affect, No acute distress.    CV:  No peripheral swelling noted  Respiratory: No respiratory distress or difficulties  Abdomen:  No abdominal masses or tenderness.  No CVA tenderness.   Skin: No jaundice.    Neuro/Psych:  Alert and oriented x 3. Affect appropriate.     Labs/Radiology:  Results for orders placed or performed in visit on 08/20/22   AMB POC URINALYSIS DIP STICK AUTO W/O MICRO   Result Value Ref Range  Color, Urine, POC Yellow     Clarity, Urine, POC Clear     Glucose, Urine, POC Negative     Bilirubin, Urine, POC Negative     Ketones, Urine, POC Negative     Specific Gravity, Urine, POC 1.015 (A) 1.001 - 1.035    Blood, Urine, POC Negative     pH, Urine, POC 5.5 4.6 - 8.0    Protein, Urine, POC Negative     Urobilinogen, POC 0.2 mg/dL     Nitrite, Urine, POC Negative     Leukocyte Esterase, Urine, POC Negative          CT A/P w/wo 01/25/22:  IMPRESSION:  1. No significant interval change.  2. Postoperative changes related to Whipple surgery.  3. Stable complex and simple bilateral renal cysts.  4. Nonobstructing bilateral renal calculi.  5. Enlarged prostate gland.  6. Additional findings as described.    CT A/P 03/23/21:  IMPRESSION:     1.  No significant interval change  when compared to the prior examination.  Stable resection of the pancreatic head and body without residual metastatic  disease.  2.  Stable complex renal cysts and nonobstructing left calculi.  3.  Small hiatal hernia.  4.  Stable hepatic subcentimeter hypoenhancing lesions.    [facility]:      CT Abd Pelv W Cont 02/17/20  IMPRESSION:  1. Mild acute diverticulitis left lower quadrant.  2. Status post Whipple's procedure, postoperative changes right upper quadrant  not significantly changed from prior study.  3. Fatty infiltration of the liver.  4. Hepatic cysts, subcentimeter hypodensities too small to characterize.  5. Complex bilateral renal cysts, too numerous to characterize.  6. Nonobstructing 2 to 3 mm left nephrolithiasis.  7. Hiatal hernia.      CC: Deguzman-Berube, Herma Carson, MD     Helane Gunther, PA-C  [facility] [place]  [address]   [place], [place] [address]  [phone] (office)      Please note that portions of this note were completed with a voice recognition program.  Efforts were made to edit the dictations but occasionally words are mis-transcribed.

## 2022-08-22 ENCOUNTER — Encounter

## 2022-08-22 NOTE — Telephone Encounter (Signed)
Faxed imaging to [facility] [phone]  (Please allow at least 72 hours for processing) PATIENT MAY CALL [phone] TO SCHEDULE

## 2022-08-27 ENCOUNTER — Encounter

## 2022-08-28 ENCOUNTER — Encounter

## 2022-08-30 ENCOUNTER — Inpatient Hospital Stay: Admit: 2022-08-30 | Payer: MEDICARE | Primary: Family Medicine

## 2022-08-30 DIAGNOSIS — J384 Edema of larynx: Secondary | ICD-10-CM

## 2022-08-30 LAB — POCT CREATININE - BLOOD: Creatinine: 1.3 mg/dl (ref 0.6–1.3)

## 2022-08-30 MED ORDER — IOPAMIDOL 61 % IV SOLN
61 | Freq: Once | INTRAVENOUS | Status: AC | PRN
Start: 2022-08-30 — End: 2022-08-30
  Administered 2022-08-30: 19:00:00 70 mL via INTRAVENOUS

## 2022-09-04 ENCOUNTER — Inpatient Hospital Stay: Admit: 2022-09-04 | Payer: MEDICARE | Primary: Family Medicine

## 2022-09-04 DIAGNOSIS — N200 Calculus of kidney: Secondary | ICD-10-CM

## 2022-09-04 NOTE — Other (Signed)
Please let patient know that his recent renal ultrasound has returned showing 1 small stone in each kidney.  The right kidney stone measures 4 mm and the left stone measures 6 mm.  Ultrasounds tend to overcall the size of stones, so these are likely mildly smaller than appearing on ultrasound. As such, we can follow up in 6 months with RUS prior. Thanks!

## 2022-09-08 ENCOUNTER — Emergency Department: Admit: 2022-09-09 | Payer: MEDICARE | Primary: Family Medicine

## 2022-09-08 DIAGNOSIS — K2971 Gastritis, unspecified, with bleeding: Principal | ICD-10-CM

## 2022-09-08 DIAGNOSIS — R112 Nausea with vomiting, unspecified: Secondary | ICD-10-CM

## 2022-09-08 NOTE — ED Notes (Signed)
Zofran given     Claudean Severance, [place]  09/09/22 [phone]

## 2022-09-08 NOTE — ED Notes (Signed)
Pt requesting that wifes name and number be put as his emergency contact  Leta Jungling  [phone]     Claudean Severance, RN  09/08/22 [phone]

## 2022-09-08 NOTE — ED Triage Notes (Signed)
Pt arrived via EMS due to hypertension and nausea when he woke up. Pt stated his pressures have been high 100 over low 100s all day.

## 2022-09-08 NOTE — ED Provider Notes (Signed)
[facility]  Emergency Department Treatment Report    Patient: Tommy Brown Age: 77 y.o. Sex: male    Date of Birth: 03-26-1945 Admit Date: 09/08/2022 PCP: Cleatis Polka, MD   MRN: [id number]  CSN: [id number]  Attending: Despina Hick, MD   Room: ER29/ER29 Time Dictated: 5:12 AM APP: Ahmed Prima, PA-C     I hereby certify this patient for admission based upon medical necessity as noted below:    Patient seen and examined using standard PPE: surgical mask, gloves.    Patient was registered incorrectly.  Prior [id number]. His name is Tommy Keens Sr.     Chief Complaint   "Sick all day"    History of Present Illness   77 y.o. male states "I have been sick all day".  States that he has had nausea and dry heaving since he woke up this morning around 7 AM.  For large part of the day he also had severe pressure to the left side of his chest.  This resolved after he took 1 baby aspirin.  No chest pain currently.  No shortness of breath.  Additionally his blood pressures have been high today. The SBP has been "really high in the 100's."      He also complains of a headache.  This started gradually approximately 30 to 40 minutes ago.  Pain starts in his neck and radiates up into his forehead.  Pain currently 6/10.  He additionally complains of crampy bilateral leg pain. States this is chronic and has been going on for "a while."    Review of Systems   Constitutional: No fever, chills  EYES: No visual symptoms  ENT: No URI symptoms  RESP: See above. No cough, wheezing  CV: See above. No palpitations.   GI: See above. No diarrhea.   GU: No dysuria, frequency  MSK: See above. No leg swelling  SKIN: No rashes  Neuro: See above. No lightheadedness/dizziness, sensory or motor symptoms    Past Medical/Surgical History   No past medical history on file.  No past surgical history on file.  Arthritis, coronary artery disease, CAD s/p CABG, HTN, hyperlipidemia, CKD III A, anemia, pancreatic cancer  s/p Whipple procedure, kidney stones , diverticulosis, hx of rectal bleeding, BPH, moderate aortic stenosis, chronic neck pain on gabapentin    Social History     Social History     Socioeconomic History    Marital status: Married     Spouse name: Not on file    Number of children: Not on file    Years of education: Not on file    Highest education level: Not on file   Occupational History    Not on file   Tobacco Use    Smoking status: Not on file    Smokeless tobacco: Not on file   Substance and Sexual Activity    Alcohol use: Not on file    Drug use: Not on file    Sexual activity: Not on file   Other Topics Concern    Not on file   Social History Narrative    Not on file     Social Determinants of Health     Financial Resource Strain: Not on file   Food Insecurity: Not on file   Transportation Needs: Not on file   Physical Activity: Not on file   Stress: Not on file   Social Connections: Not on file   Intimate Partner Violence: Not on file  Housing Stability: Not on file       Denies tobacco, alcohol, and recreational drug use.  Family History   No family history on file.    Current Medications     No current facility-administered medications for this encounter.     No current outpatient medications on file.       Allergies   Not on File    Physical Exam   Patient Vitals for the past 24 hrs:   Temp Pulse Resp BP SpO2   09/09/22 0201 -- 77 14 (!) 165/98 95 %   09/09/22 0200 -- 73 18 (!) 165/98 95 %   09/09/22 0126 -- -- -- (!) 170/95 --   09/09/22 0101 -- 69 21 (!) 170/95 96 %   09/09/22 0000 -- 73 19 (!) 189/104 95 %   09/08/22 2301 -- 75 23 (!) 168/96 97 %   09/08/22 2229 97.7 F (36.5 C) 75 17 (!) 197/101 92 %     Constitutional: Well developed, well nourished.   EYES: Conjuctivae clear, lids normal.  PERRLA.  ENT: Oral mucosa is pink, moist and without lesions. Uvula midline. Posterior pharynx normal. Airway patent. No stridor.   RESP: Clear and equal BS. No wheezing, rhonchi, crackles, or rales. No  respiratory distress, tachypnea, or accessory muscle use.  CV: Regular rate and rhythm. + Murmur  CHEST: Symmetric without masses or rash. Nontender to palpation.  VASCULAR: Calfs soft and nontender. No peripheral edema. DP pulses 2+ and equal bilaterally.  GI:  Abdomen soft, non-distended, nontender to palpation. No abdominal masses appreciated by inspection or palpation.   MSK: Moves all 4 extremities spontaneously.  Legs are nontender to palpation.  Legs are soft, supple, no swelling.  SKIN: Warm and dry.  NEURO: Alert, oriented x 3. Sensation intact, motor strength equal and symmetric. EOM intact, no nystagmus. There is no facial asymmetry or dysarthria.     Impression and Management Plan   Patient presents for nausea, vomiting, and chest pain.  Chest pain has already resolved after he took an aspirin.  He has no complaints of shortness of breath or abdominal pain.  No voiding symptoms.  He now also complains of a headache and bilateral leg cramping which she states is chronic.    Differential diagnoses: ACS, UTI, viral etiology, PE, electrolyte abnormalities,etc     Procedure   Procedures    Diagnostic Studies   Lab:   Results for orders placed or performed during the hospital encounter of 09/08/22   XR CHEST (2 VW)    Narrative    Exam: Chest PA and lateral    Clinical indication: Chest Pain    Comparison: None    Results:  No consolidation.  No pleural effusions.        No pneumothorax. Sternotomy wires.    Heart normal.  Mediastinal contours are normal.     No free air is seen under the hemidiaphragms.   Osseous structures intact.      Impression    IMPRESSION:  No acute disease.     Electronically signed by: Earline Mayotte, MD 09/09/2022 12:11 AM EDT            Workstation ID: [id number]     CT Chest Pulmonary Embolism W Contrast    Narrative    HISTORY: Chest Pain    COMPARISON: None    TECHNIQUE: Initial unenhanced monitoring slices were obtained through the  pulmonary arteries for bolus tracking  purposes. Then, multidetector CT  acquisition  was obtained through the pulmonary arteries and the chest following  IV contrast administration. 3D angiographic post-processing was performed  including coronal and sagittal MIP reconstructions.    All CT exams at this facility use one or more dose reduction techniques  including automatic exposure control, mA/kV adjustment per patient's size, or  iterative reconstruction technique.      FINDINGS:    Quality of exam: Adequate.     Pulmonary arteries: Normal in caliber and enhancement. No filling defects are  identified to suggest pulmonary thromboembolism.     Aorta: Normal in caliber with no aneurysm or dissection.    Lungs: Mild bibasilar subpleural atelectasis and groundglass opacities.  Sternotomy wires.    Mediastinum: No enlarged nodes considered pathologic by size criteria.    Osseous:  No discrete lesion.    Miscellaneous: None.      Impression    IMPRESSION:    1. No pulmonary artery filling defects to suggest acute pulmonary  thromboembolism.  2. Mild subpleural atelectasis versus scarring.    Electronically signed by: Earline Mayotte, MD 09/09/2022 3:32 AM EDT            Workstation ID: [id number]     CT ABDOMEN PELVIS W IV CONTRAST Additional Contrast? None    Narrative    EXAMINATION: CT ABDOMEN PELVIS W IV CONTRAST    CLINICAL INDICATION: nausea and vomiting    COMPARISON:  None    TECHNIQUE:  All CT exams at this facility use one or more dose reduction  techniques including automatic exposure control, ma/kV  adjustment per patient's  size, or iterative reconstruction technique.     Multiple contiguous axial CT images at 5  mm increments were obtained from the  bases of the lungs to the  pubic symphysis contrast.    FINDINGS:    Visualized lungs/chest: Subpleural atelectasis versus scarring    Hepatobiliary: Numerous hepatic cysts. No biliary duct dilatation.  Cholecystectomy.    Kidneys/ureters: Multiple bilateral renal cysts. 30 or so in each kidney.  4 mm  stone lower pole left kidney. No hydronephrosis. No further imaging of renal  cysts recommended.    Esophagus/Stomach: Unremarkable.    Adrenal glands: Unremarkable.    Pancreas: Unremarkable.    Spleen: Unremarkable.    Vascular structures:  Marked atherosclerotic calcifications of the aorta and  abdominal vessels. High-grade stenosis due to calcifications of the renal  arteries bilaterally. 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    Peritoneum/mesentery/bowel:  Unremarkable. No dilated bowel. No free air. No  mesenteric mass. No bowel obstruction.    Retroperitoneal lymph nodes: Unremarkable    Pelvis:  Penile prosthesis.  Moderate prostate enlargement. Marked rectosigmoid  diverticulosis. Appendix normal.    Bones: No significant lesions. Marked degenerative disease mid lumbar spine.  Decompressive laminectomy with posterior fixation lower lumbar spine    Miscellaneous: None.      Impression    IMPRESSION:   1. No bowel obstruction.  2. High-grade stenosis of the renal arteries bilaterally. 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  3. Moderate prostate enlargement.  4. Marked rectosigmoid diverticulosis.  5. Multiple bilateral renal cysts. Nonobstructive left renal stone.  6. Numerous hepatic cysts.    Electronically signed by: Earline Mayotte, MD 09/09/2022 3:44 AM EDT            Workstation ID: [id number]     CBC with Auto Differential   Result Value Ref Range    WBC 7.5 4.0 - 11.0 1000/mm3  RBC 4.76 3.80 - 5.70 M/uL    Hemoglobin 13.7 13.0 - 18.0 gm/dl    Hematocrit 96.0 45.4 - 55.0 %    MCV 87.2 80.0 - 98.0 fL    MCH 28.8 23.0 - 34.6 pg    MCHC 33.0 30.0 - 36.0 gm/dl    Platelets 098 119 - 450 1000/mm3    MPV 12.8 (H) 6.0 - 10.0 fL    RDW 54.0 (H) 35.1 - 43.9      Nucleated RBCs 0 0 - 0      Immature Granulocytes % 0.4 0.0 - 3.0 %    Neutrophils Segmented 68.5 (H) 34 - 64 %    Lymphocytes 24.1 (L) 28 - 48 %    Monocytes 6.0 1 - 13 %    Eosinophils 0.7 0 - 5 %    Basophils 0.3 0 -  3 %   BMP   Result Value Ref Range    Potassium 4.1 3.5 - 5.1 mEq/L    Chloride 103 98 - 107 mEq/L    Sodium 136 136 - 145 mEq/L    CO2 23 20 - 31 mEq/L    Glucose 106 74 - 106 mg/dl    BUN 16 9 - 23 mg/dl    Creatinine 1.47 8.29 - 1.30 mg/dl    GFR African American >60      GFR Non-African American >60      Calcium 9.6 8.7 - 10.4 mg/dl    Anion Gap 10 5 - 15 mmol/L   Troponin   Result Value Ref Range    Troponin, High Sensitivity 16 0 - 53 ng/L   Troponin   Result Value Ref Range    Troponin, High Sensitivity 14 0 - 53 ng/L   Lipase   Result Value Ref Range    Lipase 18 12 - 53 U/L   Hepatic Function Panel   Result Value Ref Range    AST 42.0 (H) 0.0 - 33.9 U/L    ALT 42 10 - 49 U/L    Alkaline Phosphatase 120 (H) 46 - 116 U/L    Total Bilirubin 0.80 0.30 - 1.20 mg/dl    Total Protein 6.7 5.7 - 8.2 gm/dl    Albumin 3.7 3.4 - 5.0 gm/dl    Bilirubin, Direct 0.3 0.0 - 0.3 mg/dl   POCT Urinalysis no Micro   Result Value Ref Range    Glucose, Ur Negative NEGATIVE,Negative mg/dl    Bilirubin, Urine Negative NEGATIVE,Negative      Ketones, Urine Trace (A) NEGATIVE,Negative mg/dl    Specific Gravity, UA 1.015 1.005 - 1.030      Blood, Urine Negative NEGATIVE,Negative      pH, Urine 6.0 5 - 9      Protein, Urine Negative NEGATIVE,Negative mg/dl    Urobilinogen, Urine 0.2 0.0 - 1.0 EU/dl    Nitrite, Urine Negative NEGATIVE,Negative      Leukocyte Esterase, Urine Negative NEGATIVE,Negative      Color, UA Yellow      Clarity, UA Clear         11:41PM--EKG Interpretation: My personal interpretation of the EKG, which was reviewed by Dr. Sherian Maroon, is: NSR, no acute S-T segment or T-wave abnormalities that are consistent with acute ischemia or infarction.  Vent rate: 73 bpm  PR interval: 162 ms  QRS duration: 86 ms  QT/QTc: 398/438 ms    Repeat EKG at 04:40AM: My interpretation of the EKG, which was reviewed by Dr. Clarene Duke, is: NSR, no acute  S-T segment or T-wave abnormalities that are consistent with acute ischemia or  infarction.  Vent rate: 73 bpm  PR interval: 162 ms  QRS duration: 88 ms  QT/QTc: 398/438 ms    Cardiac Monitor Interpretation: Ordered for chest pain. My personal interpretation is that it shows sinus rhythm rate 67 bpm.    Imaging: My personal interpretation of the CXR is: No infiltrate.    ED Course/MDM       Medications   ondansetron (ZOFRAN) injection 4 mg (4 mg IntraVENous Given 09/08/22 2334)   acetaminophen (OFIRMEV) infusion 1,000 mg (0 mg IntraVENous Stopped 09/09/22 0002)   ondansetron (ZOFRAN) injection 4 mg (4 mg IntraVENous Given 09/09/22 0045)   iopamidol (ISOVUE-370) 76 % injection 85 mL (85 mLs IntraVENous Given 09/09/22 0232)   prochlorperazine (COMPAZINE) injection 10 mg (10 mg IntraVENous Given 09/09/22 0315)   morphine (PF) injection 2 mg (2 mg IntraVENous Given 09/09/22 0425)   metoclopramide (REGLAN) injection 10 mg (10 mg IntraVENous Given 09/09/22 0449)       RECORDS REVIEWED:  I reviewed the patient's previous records here at [facility] and available outside facilities and note that he was admitted 3/24/20024-05/23/2022 for:    Discharge Diagnosis:      Acute painless hematochezia - suspect diverticular bleeding  Acute on chronic anemia due to GI bleeding  Blood stream infection - leukonostoc  Recent hospitalization from 05/09/22 to 05/12/22 for lower GI bleeding  CKD III A  CAD s/p CABG  Hypertension  dyslipidemia  Pancreatic cancer s/p Whipple's procedure  BPH  Strep viridans blood stream infection  Moderate aortic stenosis  Chronic neck pain on gabapentin    EXTERNAL RESULTS REVIEWED:    05/20/2022 ECHO: Left ventricle is normal in size with mildly reduced systolic function. EF 49%.    07/22/2019 nuclear stress: 1.  No evidence of reversible myocardial ischemia. 2. This is a low risk study.    Dr. Clarene Duke and myself reviewed the images of the CT abdomen pelvis from 05/18/2022 and it showed a infrarenal aortic aneurysm of similar size.    Threat to body function without evaluation and management:  GI, GU,  cardiopulmonary    SOCIAL DETERMINANTS  impacting Evaluation and Management: Income, medical bills, transportation, support systems, health coverage, provider availability.     Comorbidities impacting Evaluation and Management: Hx of HTN.     NARRATIVE:  UA trace ketones.  No nitrates or blood to suggest infection.  Initial troponin 16.  Repeat troponin 14.  CBC unremarkable.  BMP unremarkable.  AST 42.0.  Alk phos 120.  Rest of LFTs and lipase normal.  Chest CTA no pulmonary artery filling defects to suggest acute PE.  Mild subpleural atelectasis versus scarring.  CT abdomen pelvis IV contrast 1. no bowel obstruction.  2. high-grade stenosis of the renal arteries bilaterally.  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  3. moderate prostate enlargement.  4. marked rectosigmoid diverticulosis.  5. multiple bilateral renal cyst.  Nonobstructive left renal stone.  6. numerous hepatic cyst.  Initial EKG normal sinus rhythm rate 73 bpm, no acute ischemic changes, normal intervals.    Dr. Clarene Duke and myself reviewed the images of the CT abdomen pelvis from 05/18/2022 and it showed a infrarenal aortic aneurysm of similar size.  So this does not appear to be a new finding on today's CT scan.    Repeat EKG obtained to assess QT/Qtc before giving additional antiemetic medication.  Repeat EKG showed normal sinus rhythm rate 73 bpm, no  acute ischemic changes, normal intervals.      Patient presented for nausea, vomiting, and chest pain.  He also complained of a mild headache and bilateral leg cramps which he states are chronic.  Lower extremities are distally neurovascularly intact.  No suspicion for vascular or infectious etiology.  No suspicion for acute arterial occlusion.  He was treated symptomatically as listed above.  Afterwards he continued to have intractable nausea and vomiting despite receiving multiple doses of antiemetic medication.    Recommend hospital admission for further cardiac workup and for further evaluation  and treatment.  Plan discussed with patient who is agreeable.    Consult: Case discussed with hospitalist Dr. Earlie Lou via phone. Standard discussion; including history of patient's chief complaint, available diagnostic results, and treatment course. Will admit.    Final Diagnosis       ICD-10-CM    1. Intractable nausea and vomiting  R11.2       2. Chest pain, unspecified type  R07.9       3. Leg cramps  R25.2           Disposition   Admitted in stable condition.     Ahmed Prima, PA-C  September 09, 2022    The patient was fully discussed with attending Despina Hick, MD who agrees with the above assessment and plan.    Nursing notes have been reviewed by the physician/advanced practice Clinician.    Dragon medical dictation was used for portions of this report. Unintended grammatical errors may occur.    My signature above authenticates this document and my orders, the final diagnosis (es), discharge prescription (s), and instructions in the Epic record. If you have any questions please contact [phone].     Ahmed Prima, PA-C  09/09/22 [phone]

## 2022-09-09 ENCOUNTER — Inpatient Hospital Stay
Admission: EM | Admit: 2022-09-09 | Discharge: 2022-09-11 | Disposition: A | Discharge status: Home / Self Care | Payer: Medicare Other | Admitting: Internal Medicine

## 2022-09-09 ENCOUNTER — Emergency Department: Admit: 2022-09-09 | Payer: MEDICARE | Primary: Family Medicine

## 2022-09-09 DIAGNOSIS — R112 Nausea with vomiting, unspecified: Secondary | ICD-10-CM

## 2022-09-09 LAB — LIPID PANEL
Chol/HDL Ratio: 3.4 Ratio (ref 0.0–5.0)
Cholesterol, Total: 149 mg/dl (ref 0–199)
HDL: 44 mg/dl (ref 40–60)
LDL Cholesterol: 85 mg/dl (ref 0–130)
Triglycerides: 99 mg/dl (ref 0–150)

## 2022-09-09 LAB — BASIC METABOLIC PANEL
Anion Gap: 10 mmol/L (ref 5–15)
BUN: 16 mg/dl (ref 9–23)
CO2: 23 mEq/L (ref 20–31)
Calcium: 9.6 mg/dl (ref 8.7–10.4)
Chloride: 103 mEq/L (ref 98–107)
Creatinine: 1.1 mg/dl (ref 0.70–1.30)
GFR African American: >60
GFR Non-African American: >60
Glucose: 106 mg/dl (ref 74–106)
Potassium: 4.1 mEq/L (ref 3.5–5.1)
Sodium: 136 mEq/L (ref 136–145)

## 2022-09-09 LAB — POCT URINALYSIS DIPSTICK
Bilirubin, Urine: NEGATIVE
Blood, Urine: NEGATIVE
Glucose, Ur: NEGATIVE mg/dl
Leukocyte Esterase, Urine: NEGATIVE
Nitrite, Urine: NEGATIVE
Protein, Urine: NEGATIVE mg/dl
Specific Gravity, UA: 1.015 (ref 1.005–1.030)
Urobilinogen, Urine: 0.2 EU/dl (ref 0.0–1.0)
pH, Urine: 6 (ref 5–9)

## 2022-09-09 LAB — CBC WITH AUTO DIFFERENTIAL
Basophils: 0.3 % (ref 0–3)
Eosinophils: 0.7 % (ref 0–5)
Hematocrit: 41.5 % (ref 36.0–55.0)
Hemoglobin: 13.7 gm/dl (ref 13.0–18.0)
Immature Granulocytes %: 0.4 % (ref 0.0–3.0)
Lymphocytes: 24.1 % — ABNORMAL LOW (ref 28–48)
MCH: 28.8 pg (ref 23.0–34.6)
MCHC: 33 gm/dl (ref 30.0–36.0)
MCV: 87.2 fL (ref 80.0–98.0)
MPV: 12.8 fL — ABNORMAL HIGH (ref 6.0–10.0)
Monocytes: 6 % (ref 1–13)
Neutrophils Segmented: 68.5 % — ABNORMAL HIGH (ref 34–64)
Nucleated RBCs: 0 (ref 0–0)
Platelets: 152 10*3/uL (ref 140–450)
RBC: 4.76 M/uL (ref 3.80–5.70)
RDW: 54 — ABNORMAL HIGH (ref 35.1–43.9)
WBC: 7.5 10*3/uL (ref 4.0–11.0)

## 2022-09-09 LAB — HEPATIC FUNCTION PANEL
ALT: 42 U/L (ref 10–49)
AST: 42 U/L — ABNORMAL HIGH (ref 0.0–33.9)
Albumin: 3.7 gm/dl (ref 3.4–5.0)
Alkaline Phosphatase: 120 U/L — ABNORMAL HIGH (ref 46–116)
Bilirubin, Direct: 0.3 mg/dl (ref 0.0–0.3)
Total Bilirubin: 0.8 mg/dl (ref 0.30–1.20)
Total Protein: 6.7 gm/dl (ref 5.7–8.2)

## 2022-09-09 LAB — EKG 12-LEAD
Atrial Rate: 73 {beats}/min
Calculated P Axis: -20 degrees
Calculated R Axis: -35 degrees
Calculated T Axis: 41 degrees
DIAGNOSIS, 93000: NORMAL
P-R Interval: 162 ms
Q-T Interval: 398 ms
QRS Duration: 86 ms
QTC Calculation (Bezet): 438 ms
Ventricular Rate: 73 {beats}/min

## 2022-09-09 LAB — TROPONIN
Troponin, High Sensitivity: 16 ng/L (ref 0–53)
Troponin, High Sensitivity: 14 ng/L (ref 0–53)
Troponin, High Sensitivity: 16 ng/L (ref 0–53)

## 2022-09-09 LAB — LIPASE: Lipase: 18 U/L (ref 12–53)

## 2022-09-09 MED ORDER — ONDANSETRON HCL 4 MG/2ML IJ SOLN
4 | Freq: Four times a day (QID) | INTRAMUSCULAR | Status: DC | PRN
Start: 2022-09-09 — End: 2022-09-11

## 2022-09-09 MED ORDER — POTASSIUM CHLORIDE 10 MEQ/100ML IV SOLN
10 | INTRAVENOUS | Status: DC | PRN
Start: 2022-09-09 — End: 2022-09-11

## 2022-09-09 MED ORDER — GABAPENTIN 300 MG PO CAPS
300 | Freq: Three times a day (TID) | ORAL | Status: DC
Start: 2022-09-09 — End: 2022-09-10

## 2022-09-09 MED ORDER — ONDANSETRON 4 MG PO TBDP
4 | Freq: Three times a day (TID) | ORAL | Status: DC | PRN
Start: 2022-09-09 — End: 2022-09-11

## 2022-09-09 MED ORDER — TEMAZEPAM 7.5 MG PO CAPS
7.5 | Freq: Every evening | ORAL | Status: DC | PRN
Start: 2022-09-09 — End: 2022-09-11

## 2022-09-09 MED ORDER — ACETAMINOPHEN 325 MG PO TABS
325 | Freq: Four times a day (QID) | ORAL | Status: DC | PRN
Start: 2022-09-09 — End: 2022-09-11
  Administered 2022-09-10: 10:00:00 650 mg via ORAL

## 2022-09-09 MED ORDER — NORMAL SALINE FLUSH 0.9 % IV SOLN
0.9 | INTRAVENOUS | Status: DC | PRN
Start: 2022-09-09 — End: 2022-09-11

## 2022-09-09 MED ORDER — ONDANSETRON HCL 4 MG/2ML IJ SOLN
4 | Freq: Once | INTRAMUSCULAR | Status: AC
Start: 2022-09-09 — End: 2022-09-09

## 2022-09-09 MED ORDER — NORMAL SALINE FLUSH 0.9 % IV SOLN
0.9 | Freq: Two times a day (BID) | INTRAVENOUS | Status: DC
Start: 2022-09-09 — End: 2022-09-11
  Administered 2022-09-11: 13:00:00 10 mL via INTRAVENOUS

## 2022-09-09 MED ORDER — PROCHLORPERAZINE EDISYLATE 10 MG/2ML IJ SOLN
10 | Freq: Once | INTRAMUSCULAR | Status: AC
Start: 2022-09-09 — End: 2022-09-09

## 2022-09-09 MED ORDER — METOCLOPRAMIDE HCL 5 MG/ML IJ SOLN
5 | INTRAMUSCULAR | Status: AC
Start: 2022-09-09 — End: 2022-09-09

## 2022-09-09 MED ORDER — AMLODIPINE BESYLATE 5 MG PO TABS
5 | Freq: Every day | ORAL | Status: DC
Start: 2022-09-09 — End: 2022-09-11
  Administered 2022-09-11: 13:00:00 5 mg via ORAL

## 2022-09-09 MED ORDER — PANTOPRAZOLE SODIUM 40 MG IV SOLR
40 MG | Freq: Two times a day (BID) | INTRAVENOUS | Status: DC
Start: 2022-09-09 — End: 2022-09-11
  Administered 2022-09-11: 13:00:00 40 mg via INTRAVENOUS

## 2022-09-09 MED ORDER — POLYETHYLENE GLYCOL 3350 17 G PO PACK
17 | Freq: Every day | ORAL | Status: DC | PRN
Start: 2022-09-09 — End: 2022-09-11

## 2022-09-09 MED ORDER — PROCHLORPERAZINE EDISYLATE 10 MG/2ML IJ SOLN
10 | Freq: Four times a day (QID) | INTRAMUSCULAR | Status: DC | PRN
Start: 2022-09-09 — End: 2022-09-11

## 2022-09-09 MED ORDER — MAGNESIUM SULFATE 2000 MG/50 ML IVPB PREMIX
2 | INTRAVENOUS | Status: DC | PRN
Start: 2022-09-09 — End: 2022-09-11

## 2022-09-09 MED ORDER — PROMETHAZINE (PHENERGAN) 25 MG 50 ML IVPB
INTRAVENOUS | Status: DC
Start: 2022-09-09 — End: 2022-09-09

## 2022-09-09 MED ORDER — IOPAMIDOL 76 % IV SOLN
76 | Freq: Once | INTRAVENOUS | Status: AC | PRN
Start: 2022-09-09 — End: 2022-09-09

## 2022-09-09 MED ORDER — ONDANSETRON HCL 4 MG/2ML IJ SOLN
4 | INTRAMUSCULAR | Status: AC
Start: 2022-09-09 — End: 2022-09-08

## 2022-09-09 MED ORDER — ACETAMINOPHEN 10 MG/ML IV SOLN
10 | INTRAVENOUS | Status: AC
Start: 2022-09-09 — End: 2022-09-09

## 2022-09-09 MED ORDER — PRAVASTATIN SODIUM 10 MG PO TABS
10 | Freq: Every evening | ORAL | Status: DC
Start: 2022-09-09 — End: 2022-09-11

## 2022-09-09 MED ORDER — MORPHINE SULFATE (PF) 2 MG/ML IV SOLN
2 | INTRAVENOUS | Status: AC
Start: 2022-09-09 — End: 2022-09-09

## 2022-09-09 MED ORDER — ENOXAPARIN SODIUM 40 MG/0.4ML IJ SOSY
40 | Freq: Every day | INTRAMUSCULAR | Status: DC
Start: 2022-09-09 — End: 2022-09-11
  Administered 2022-09-11: 13:00:00 40 mg via SUBCUTANEOUS

## 2022-09-09 MED ORDER — POTASSIUM CHLORIDE CRYS ER 20 MEQ PO TBCR
20 | ORAL | Status: DC | PRN
Start: 2022-09-09 — End: 2022-09-11

## 2022-09-09 MED ORDER — PANTOPRAZOLE SODIUM 20 MG PO TBEC
20 | Freq: Every day | ORAL | Status: DC
Start: 2022-09-09 — End: 2022-09-09

## 2022-09-09 MED ORDER — ONDANSETRON HCL 4 MG/2ML IJ SOLN
4 | Freq: Once | INTRAMUSCULAR | Status: DC
Start: 2022-09-09 — End: 2022-09-09

## 2022-09-09 MED ORDER — POTASSIUM CHLORIDE 20 MEQ/15ML (10%) PO SOLN
20 | ORAL | Status: DC | PRN
Start: 2022-09-09 — End: 2022-09-11

## 2022-09-09 MED ORDER — SODIUM CHLORIDE 0.9 % IV SOLN
0.9 | INTRAVENOUS | Status: DC | PRN
Start: 2022-09-09 — End: 2022-09-11

## 2022-09-09 MED ORDER — ACETAMINOPHEN 650 MG RE SUPP
650 | Freq: Four times a day (QID) | RECTAL | Status: DC | PRN
Start: 2022-09-09 — End: 2022-09-11

## 2022-09-09 MED ORDER — PLACEHOLDER
Status: AC
Start: 2022-09-09 — End: 2022-09-09

## 2022-09-09 MED ORDER — TAMSULOSIN HCL 0.4 MG PO CAPS
0.4 | Freq: Every day | ORAL | Status: DC
Start: 2022-09-09 — End: 2022-09-11
  Administered 2022-09-11: 13:00:00 0.4 mg via ORAL

## 2022-09-09 MED ORDER — ASPIRIN 81 MG PO TBEC
81 | Freq: Every day | ORAL | Status: DC
Start: 2022-09-09 — End: 2022-09-11
  Administered 2022-09-11: 13:00:00 81 mg via ORAL

## 2022-09-09 MED FILL — ASPIRIN LOW DOSE 81 MG PO TBEC: 81 MG | ORAL | Qty: 1

## 2022-09-09 MED FILL — PROCHLORPERAZINE EDISYLATE 10 MG/2ML IJ SOLN: 10 MG/2ML | INTRAMUSCULAR | Qty: 2

## 2022-09-09 MED FILL — MORPHINE SULFATE 2 MG/ML IJ SOLN: 2 mg/mL | INTRAMUSCULAR | Qty: 1

## 2022-09-09 MED FILL — TAMSULOSIN HCL 0.4 MG PO CAPS: 0.4 MG | ORAL | Qty: 1

## 2022-09-09 MED FILL — ONDANSETRON HCL 4 MG/2ML IJ SOLN: 4 MG/2ML | INTRAMUSCULAR | Qty: 2

## 2022-09-09 MED FILL — GABAPENTIN 300 MG PO CAPS: 300 MG | ORAL | Qty: 1

## 2022-09-09 MED FILL — AMLODIPINE BESYLATE 5 MG PO TABS: 5 MG | ORAL | Qty: 1

## 2022-09-09 MED FILL — ENOXAPARIN SODIUM 40 MG/0.4ML IJ SOSY: 40 MG/0.4ML | INTRAMUSCULAR | Qty: 0.4

## 2022-09-09 MED FILL — PANTOPRAZOLE SODIUM 40 MG IV SOLR: 40 MG | INTRAVENOUS | Qty: 40

## 2022-09-09 MED FILL — METOCLOPRAMIDE HCL 5 MG/ML IJ SOLN: 5 MG/ML | INTRAMUSCULAR | Qty: 2

## 2022-09-09 MED FILL — ISOVUE-370 76 % IV SOLN: 76 % | INTRAVENOUS | Qty: 85

## 2022-09-09 MED FILL — ACETAMINOPHEN 10 MG/ML IV SOLN: 10 MG/ML | INTRAVENOUS | Qty: 100

## 2022-09-09 NOTE — H&P (Signed)
Hospitalist Admission Note    NAME:  Tommy Brown   DOB:   1945/04/21   MRN:   [id number]     Date/Time:  09/09/2022 7:32 AM    Subjective:     CHIEF COMPLAINT: Nauseous and vomiting      HISTORY OF PRESENT ILLNESS:     Tommy Brown is a 77 y.o.  male with history significant for hypertension, coronary disease status post CABG, hyperlipidemia, BPH, moderate aortic stenosis, history of the pancreas cancer status post Whipple procedure presented with a complaint of the nauseous and vomiting .  Patient report he has been sick all day long with a nauseous vomiting of the workup in yesterday morning morning.  Also had a chest pressure on the left side which resolved with the baby aspirin.  Also reported his blood pressure has been elevated and the headache but denies any chest pain or dyspnea, denies any fever or chills denies any blood in stool abdominal pain.  In the emergency room, her blood pressure was 197/101 heart rate of 75 respiratory 17 afebrile eyes O2 sat 92% chest CT no evidence of the pulmonary embolism and abdominal CT High-grade stenosis of the renal arteries bilaterally. 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  Patient was admitted to the hospital for further management  Social History     Tobacco Use    Smoking status: Not on file    Smokeless tobacco: Not on file   Substance Use Topics    Alcohol use: Not on file        No family history on file.     Not on File     Prior to Admission medications    Not on File       REVIEW OF SYSTEMS:    History obtained from chart review and the patient  General ROS: negative  Ophthalmic ROS: negative  ENT ROS: negative  Hematological and Lymphatic ROS: negative for - bleeding problems, jaundice or weight loss  Endocrine ROS: negative for - hot flashes, mood swings or temperature intolerance  Respiratory ROS: negative for - hemoptysis or stridor  Cardiovascular ROS: negative for - chest pressure or palpitations  Gastrointestinal ROS: Positive for nausea vomiting no abdominal  pain, change in bowel habits, or black or bloody stools  Genito-Urinary ROS: no dysuria, trouble voiding, or hematuria  Musculoskeletal ROS: negative for - joint pain or joint swelling  Neurological ROS: negative for - seizures or visual changes  Dermatological ROS: negative for - rash or mass    Objective:   VITALS:   BP (!) 157/80   Pulse 77   Temp 97.7 F (36.5 C) (Oral)   Resp 15   Ht 1.778 m (5\' 10" )   Wt 68 kg (150 lb)   SpO2 94%   BMI 21.52 kg/m          PHYSICAL EXAM:   General:    Lying in bed in no acute distress.  HEENT:  Pupils equal.  Sclera anicteric.  Conjunctiva pink.  Mucous membranes                           moist  Neck:  Supple.  Trachea midline.  No accessory muscle use.  No thyromegaly.                           No jugular venous distention  CV:  Regular rate and rhythm.  Lungs:   Clear to auscultation bilaterally.  No Wheezing or Rhonchi. No rales.                           Normal to percussion  Abdomen:   Soft, non-tender. Not distended.  Bowel sounds normal. No organomegaly  Extremities: No cyanosis.  No edema. No clubbing  Neurologic: Alert and oriented X 3.    Skin:                Warm and dry.  No rashes.       LAB DATA REVIEWED:    Lab Results   Component Value Date/Time    WBC 7.5 09/08/2022 10:25 PM    HGB 13.7 09/08/2022 10:25 PM    HCT 41.5 09/08/2022 10:25 PM    PLT 152 09/08/2022 10:25 PM    MCV 87.2 09/08/2022 10:25 PM       Lab Results   Component Value Date/Time    NA 136 09/08/2022 10:25 PM    K 4.1 09/08/2022 10:25 PM    CL 103 09/08/2022 10:25 PM    CO2 23 09/08/2022 10:25 PM    BUN 16 09/08/2022 10:25 PM    GFRAA >60 09/08/2022 10:25 PM       EKG: No results found for this or any previous visit (from the past 4464 hour(s)).    XR Results (most recent):      CT Results (most recent):  High-grade stenosis of the renal arteries bilaterally. 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      Assessment/Plan:      Principal Problem:  Intractable nausea and  vomiting.  In setting of the history of the GI bleeding, unclear etiology suspect the gastritis, peptic ulcer.  Elevated blood pressure, started on Protonix and IV fluids, liquid diet, lipase within normal limit, CBC troponin negative          Hypertension urgency. New, Not on antihypertensive meds at home  Monitor pressure closely started on amlodipine  Bilateral renal artery stenosis.  CT scan showed bilateral severe renal artery stenosis.  Vascular surgery consult  CKD III A monitor renal function renal dose of the medication avoid nephrotoxin  CAD s/p CABG continue aspirin and statin  Dyslipidemia check a lipid profile  Pancreatic cancer s/p Whipple's procedure  BPH continue Flomax  Moderate aortic stenosis  Chronic neck pain on gabapentin  Continue home regimen for otherwise chronic, stable medical conditions as noted above. Discussed with patient and or family regarding management, prognosis, treatment and complications of the patient's medical conditions in detail. All questions answered to the satisfaction of those individual(s) who also verbalized understanding of and agreement with the assessment and plan.     Prophylaxis:  [x] Lovenox  [] Coumadin  [] Hep SQ  [] SCD's  [] H2B/PPI    Disposition:  [x] Home w/ Family   [] HH PT,OT,RN   [] SNF/LTC   [] SAH/Rehab    Discussed Code Status:    [x] Full Code      [] DNR     ___________________________________________________    Care Plan discussed with:    [x] Patient   [] Family    [] ED Care Manager  [x] ED Doc   [] Specialist :    Anticipated Date of Discharge: 2-3 days   Anticipated Disposition (home, SNF) : HH    Total Time Coordinating Admission:  71  minutes            Print production planner medical  dictation software was used for portions of this report.  Unintended voice transcription errors may have occurred.)  ___________________________________________________  Admitting Physician: Rosemary Holms, MD

## 2022-09-09 NOTE — ED Notes (Signed)
Pt taken to CT     Claudean Severance, [place]  09/09/22 [phone]

## 2022-09-09 NOTE — Consults (Signed)
Vascular Surgery  Consultation    Admit date: 09/08/2022  Consult date: 09/09/2022  Referring provider: Rosemary Holms, MD     CC:   Chief Complaint   Patient presents with    [facility]    Nausea    Emesis    Hypertension       Assessment/Plan   Tommy Brown is an 77 y.o. male with nausea, vomiting and hypertension, Hx of CAD s/p CABG, HLD, HTN, pancreatic cancer s/p Whipple procedure 2011, CKD stage III, recently hospitalized twice in 04/2022 2/2 GI bleed    Extremities warm and well perfused, abdomen benign  Continue Asa 81 mg  Restart pravastatin 10 mg  N/V improved  Antihypertensives per medicine  Duplex ordered  CTA reviewed and compared to CTA 04/2022, no change in bilateral renal arteries or changes in size of kidneys, atherosclerosis of aorta stable, continue medical management       Thank you for allowing [place] to participate in the care of this patient.    Jennette Bill, PA-C      HPI:  Tommy Brown is an 77 y.o. male who presented to the ED yesterday after not feeling well and a BP of 180/104 at home. Patient has PMHx of CAD s/p CABG, pancreatic cancer s/p Whipple in 2011, HTN, HLD, BPH. Patient takes an 81 mg aspirin daily. He states that his statin and both of his antihypertensives were stopped after his hospitalization in March. Patient moved to the area from [place] 5 years ago. He is feeling better than yesterday and denies abdominal or flank pain.       No past medical history on file.    No past surgical history on file.     Allergies   Allergen Reactions    Oxycodone Anaphylaxis    Amoxicillin Diarrhea    Codeine Itching and Swelling    Hydroxyzine Hcl Hives    Hydroxyzine Pamoate Itching    Metoclopramide Itching and Swelling    Other      VISTAID ANESTHESIA    Oxycodone-Acetaminophen Itching    Prochlorperazine Itching    Meperidine Itching, Swelling and Palpitations    Morphine Nausea And Vomiting       No family history on file.    Social History     Socioeconomic History    Marital  status: Married     Spouse name: Not on file    Number of children: Not on file    Years of education: Not on file    Highest education level: Not on file   Occupational History    Not on file   Tobacco Use    Smoking status: Not on file    Smokeless tobacco: Not on file   Substance and Sexual Activity    Alcohol use: Not on file    Drug use: Not on file    Sexual activity: Not on file   Other Topics Concern    Not on file   Social History Narrative    Not on file     Social Determinants of Health     Financial Resource Strain: Not on file   Food Insecurity: Not on file   Transportation Needs: Not on file   Physical Activity: Not on file   Stress: Not on file   Social Connections: Not on file   Intimate Partner Violence: Not on file   Housing Stability: Not on file       Body mass index is 21.52 kg/m.  Review of Systems  Positive Findings will be BOLDED, otherwise negative  Constitutional:  Chills, diaphoresis, fever, malaise/fatigue, weakness, weight loss   Eyes:  Vision changes  ENT/Mouth/Face:  Congestion, headaches, sore throat   Respiratory:  Cough, shortness of breath   Cardiovascular:  Chest pain, claudication, leg swelling, palpitations   Gastrointestinal:  Abdominal pain, blood in stool, nausea, vomiting   Genitourinary:  Dysuria, flank pain, frequency, blood in urine  Integumentary:  Rashes  Hematologic: Easy bruising/bleeding  Musculoskeletal: Back pain, muscle pain  Neurological: Dizziness, focal weakness, seizures, sensory changes  Psych: Depression, memory loss, nervous/anxious, substance abuse    Objective   Physical Exam  BP (!) 165/105   Pulse 86   Temp 97.7 F (36.5 C) (Oral)   Resp 17   Ht 1.778 m (5\' 10" )   Wt 68 kg (150 lb)   SpO2 95%   BMI 21.52 kg/m   Constitutional:    Patient is well developed, nourished, and not distressed.   HEENT:   Eyes: conjunctiva pale, sclera clear  ENT: Good oral hygiene  Oral mucosa: pink   Neck: no JVD present  Cardiovascular:    Ascultation:  RRR  Pulmonary/Chest:   Effort: normal breathing pattern  Abdominal:    Palpation: patient exhibits no pulsatile midline mass, no tenderness, non distended  Extremities:   ROM: normal  Digits: no cyanosis or clubbing  Edema: no edema  Neurological:   Oriented: alert and oriented x3, affect and mood normal  Gait: not tested  Motor & sensory: grossly intact in all 4 limbs  Skin:    No wounds or lesions, no erythema  Pulses:  Extremities warm and well perfused         Labs   CBC w/Diff   Lab Results   Component Value Date/Time    WBC 7.5 09/08/2022 10:25 PM    RBC 4.76 09/08/2022 10:25 PM    HCT 41.5 09/08/2022 10:25 PM    MCV 87.2 09/08/2022 10:25 PM    MCH 28.8 09/08/2022 10:25 PM    MCHC 33.0 09/08/2022 10:25 PM    RDW 54.0 (H) 09/08/2022 10:25 PM    MPV 12.8 (H) 09/08/2022 10:25 PM        Basic Metabolic Profile   Lab Results   Component Value Date    NA 136 09/08/2022    CO2 23 09/08/2022    BUN 16 09/08/2022    GLUCOSE 106 09/08/2022    CALCIUM 9.6 09/08/2022        Coagulation   No results found for: "INR", "APTT"     PVL/Radiology reviewed  IMPRESSION:   1. No bowel obstruction.  2. High-grade stenosis of the renal arteries bilaterally. 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  3. Moderate prostate enlargement.  4. Marked rectosigmoid diverticulosis.  5. Multiple bilateral renal cysts. Nonobstructive left renal stone.  6. Numerous hepatic cysts.  Data: I independently reviewed the images of the   CTA    Marvel Plan  [facility]  Office [phone]    09/09/2022 11:52 AM        Attending MD Attestation:    Pt seen and examined.  Above assessment and plan discussed in detail with PA/Fellow, and I am in agreement.        Stable chronic bilat renal artery stenosis, unrelated to nausea,  Needs good BP control.  Renal duplex.  Will follow this as an OP.  Charlane Ferretti III, MD          [address], [address]    Phone: [phone]    Fax: [phone]

## 2022-09-09 NOTE — ED Notes (Signed)
Pt had episode of emesis  New blankets were provided along with emesis bag     Claudean Severance, [place]  09/09/22 [phone]

## 2022-09-09 NOTE — ED Notes (Signed)
Another dose of zofran given due to pt still feeling nauseous      Claudean Severance, [place]  09/09/22 [phone]

## 2022-09-09 NOTE — ED Notes (Signed)
Compazine given for nausea     Claudean Severance, [place]  09/09/22 [phone]

## 2022-09-09 NOTE — ED Notes (Signed)
Report given to Magnus Sinning. RN. Bedside report completed.     Army Melia, RN  09/09/22 [id number]

## 2022-09-09 NOTE — ED Notes (Signed)
Pt correct name and spelling:  Tommy Brown  09-19-1945  Correct MRN: [id number]    Notified Charge and registration      Claudean Severance, [place]  09/09/22 [phone]

## 2022-09-09 NOTE — ED Notes (Signed)
Pt given dose of morphine for pain     Claudean Severance, [place]  09/09/22 [phone]

## 2022-09-09 NOTE — Care Coordination-Inpatient (Signed)
[facility]   Care Management Assessment    Current discharge plan is treatment and then d/c home with family      Agreeable to home health if recommended at this time? No       Agreeable to SNF if recommended at this time? no  Permission received from patient to load them into Epic for  No.    Home address verified:  Yes       Phone number verified:  Yes    Emergency contact(s) verified: Yes     PCP Verification: Verified PCP is active or a list of [place] primary care physicians who are accepting new patients provided to patient. Patient to choose a physician from this list and will call for an appointment.    Insurance verified:  Yes           DME prior to admission: N/A                             Pharmacy: [facility] [place]     Medication Assistance Anticipated: No      Clothing assistance anticipated for d/c? No  Patient is agreeable to using [facility] outpatient pharmacy for any new or changed prescriptions prior to discharge, pharmacy updated in EPIC.        Advanced Care Plan: Pt is a full code    Ordered: No    Dialysis: N/A    TCC Referral:  N/A    Active Medicaid? N/A     UAI anticipated/needed for d/c? N/A       09/09/22 1329   Service Assessment   Patient Orientation Alert and Oriented   Cognition Alert   History Provided By Patient   Primary Caregiver Self   Support Systems Spouse/Significant Other   Patient's Healthcare Decision Maker is: Legal Next of Kin   PCP Verified by CM Yes  (Dr. Dorathy Kinsman)   Last Visit to PCP Within last 3 months   Prior Functional Level Independent in ADLs/IADLs   Current Functional Level Independent in ADLs/IADLs   Can patient return to prior living arrangement Yes   Ability to make needs known: Good   Family able to assist with home care needs: Yes   Would you like for me to discuss the discharge plan with any other family members/significant others, and if so, who? Yes  (Pt wife listed)   Event organiser Resources None    Social/Functional History   Lives With Spouse   Type of Home Apartment   Home Layout One level   Home Access Level entry   Bathroom Shower/Tub Tub/Shower unit   Armed forces technical officer Grab bars in Immunologist   Home Equipment None   Receives Help From Family   ADL Assistance Independent   Barrister's clerk Yes   Mode of Engineer, water   Occupation Full time employment  Training and development officer)   Discharge Planning   Type of Residence Apartment   Living Arrangements Spouse/Significant Other   Current Services Prior To Admission None   Potential Assistance Needed N/A   DME Ordered? No   Potential Assistance Purchasing Medications No   Type of Home Care Services None   Patient expects to be discharged to: Apartment   One/Two Story Residence One story   History  of falls? 0   Services At/After Discharge   Transition of Care Consult (CM Consult) N/A   Services At/After Discharge None   Mode of Transport at Discharge Other (see comment)  (Pt wife to transport)   Confirm Follow Up Transport Family   Condition of Participation: Discharge Planning   The Plan for Transition of Care is related to the following treatment goals: Treatment and then d/c to home with family   The Patient and/or Patient Representative was provided with a Choice of Provider? Patient   The Patient and/Or Patient Representative agree with the Discharge Plan? Yes   Freedom of Choice list was provided with basic dialogue that supports the patient's individualized plan of care/goals, treatment preferences, and shares the quality data associated with the providers?  Yes

## 2022-09-09 NOTE — ED Notes (Signed)
This RN took over care for pt at this time and introduced self to pt. Report received from [facility]. Call light in reach and warm blankets applied to pt. Stretcher in lowest position with wheels locked. Pt currently resting comfortably in stretcher.     Army Melia, RN  09/09/22 [phone]

## 2022-09-09 NOTE — Progress Notes (Signed)
PVL:  Renal artery duplex    Hemodynamically significant (>60%) right renal artery stenosis.    2.   No evidence of left renal artery stenosis within the areas visualized--the origin of the left renal artery was poorly visualized.    3.  The renal veins are patent bilaterally.     4.  Multiple cysts seen within the bilateral kidneys.     5.  Technically limited exam due to overlying bowel gas and poor patient poor positioning.     Final report to follow  Cherrie Distance,  RVT

## 2022-09-10 ENCOUNTER — Telehealth

## 2022-09-10 LAB — COMPREHENSIVE METABOLIC PANEL W/ REFLEX TO MG FOR LOW K
ALT: 31 U/L (ref 10–49)
AST: 45 U/L — ABNORMAL HIGH (ref 0.0–33.9)
Albumin: 3.2 gm/dl — ABNORMAL LOW (ref 3.4–5.0)
Alkaline Phosphatase: 112 U/L (ref 46–116)
Anion Gap: 11 mmol/L (ref 5–15)
BUN: 21 mg/dl (ref 9–23)
CO2: 21 mEq/L (ref 20–31)
Calcium: 8.8 mg/dl (ref 8.7–10.4)
Chloride: 106 mEq/L (ref 98–107)
Creatinine: 1.19 mg/dl (ref 0.70–1.30)
Glucose: 91 mg/dl (ref 74–106)
Potassium: 4.5 mEq/L (ref 3.5–5.1)
Sodium: 138 mEq/L (ref 136–145)
Total Bilirubin: 0.7 mg/dl (ref 0.30–1.20)
Total Protein: 6 gm/dl (ref 5.7–8.2)

## 2022-09-10 LAB — EKG 12-LEAD
Atrial Rate: 73 {beats}/min
Calculated P Axis: -14 degrees
Calculated R Axis: -41 degrees
Calculated T Axis: 47 degrees
DIAGNOSIS, 93000: NORMAL
P-R Interval: 162 ms
Q-T Interval: 398 ms
QRS Duration: 88 ms
QTC Calculation (Bezet): 438 ms
Ventricular Rate: 73 {beats}/min

## 2022-09-10 LAB — CBC WITH AUTO DIFFERENTIAL
Basophils: 0.1 % (ref 0–3)
Eosinophils: 0.7 % (ref 0–5)
Hematocrit: 41.8 % (ref 36.0–55.0)
Hemoglobin: 13.8 gm/dl (ref 13.0–18.0)
Immature Granulocytes %: 0.4 % (ref 0.0–3.0)
Lymphocytes: 19.1 % — ABNORMAL LOW (ref 28–48)
MCH: 29.6 pg (ref 23.0–34.6)
MCHC: 33 gm/dl (ref 30.0–36.0)
MCV: 89.7 fL (ref 80.0–98.0)
MPV: 11.3 fL — ABNORMAL HIGH (ref 6.0–10.0)
Monocytes: 7 % (ref 1–13)
Neutrophils Segmented: 72.7 % — ABNORMAL HIGH (ref 34–64)
Nucleated RBCs: 0 (ref 0–0)
Platelets: 146 10*3/uL (ref 140–450)
RBC: 4.66 M/uL (ref 3.80–5.70)
RDW: 55.3 — ABNORMAL HIGH (ref 35.1–43.9)
WBC: 7.3 10*3/uL (ref 4.0–11.0)

## 2022-09-10 MED ORDER — METOPROLOL TARTRATE 25 MG PO TABS
25 | Freq: Two times a day (BID) | ORAL | Status: DC
Start: 2022-09-10 — End: 2022-09-11
  Administered 2022-09-11: 13:00:00 25 mg via ORAL

## 2022-09-10 MED ORDER — GABAPENTIN 400 MG PO CAPS
400 | Freq: Three times a day (TID) | ORAL | Status: DC
Start: 2022-09-10 — End: 2022-09-11
  Administered 2022-09-11: 13:00:00 400 mg via ORAL

## 2022-09-10 MED FILL — GABAPENTIN 400 MG PO CAPS: 400 MG | ORAL | Qty: 1

## 2022-09-10 MED FILL — ACETAMINOPHEN 325 MG PO TABS: 325 MG | ORAL | Qty: 2

## 2022-09-10 MED FILL — AMLODIPINE BESYLATE 5 MG PO TABS: 5 MG | ORAL | Qty: 1

## 2022-09-10 MED FILL — METOPROLOL TARTRATE 25 MG PO TABS: 25 MG | ORAL | Qty: 1

## 2022-09-10 MED FILL — GABAPENTIN 300 MG PO CAPS: 300 MG | ORAL | Qty: 1

## 2022-09-10 MED FILL — ENOXAPARIN SODIUM 40 MG/0.4ML IJ SOSY: 40 MG/0.4ML | INTRAMUSCULAR | Qty: 0.4

## 2022-09-10 MED FILL — ASPIRIN LOW DOSE 81 MG PO TBEC: 81 MG | ORAL | Qty: 1

## 2022-09-10 MED FILL — PANTOPRAZOLE SODIUM 40 MG IV SOLR: 40 MG | INTRAVENOUS | Qty: 40

## 2022-09-10 MED FILL — TAMSULOSIN HCL 0.4 MG PO CAPS: 0.4 MG | ORAL | Qty: 1

## 2022-09-10 MED FILL — PROCHLORPERAZINE EDISYLATE 10 MG/2ML IJ SOLN: 10 MG/2ML | INTRAMUSCULAR | Qty: 2

## 2022-09-10 MED FILL — TEMAZEPAM 7.5 MG PO CAPS: 7.5 MG | ORAL | Qty: 2

## 2022-09-10 MED FILL — PRAVASTATIN SODIUM 10 MG PO TABS: 10 MG | ORAL | Qty: 1

## 2022-09-10 NOTE — ED Notes (Signed)
Report given to [facility]      Carie Caddy, RN  09/10/22 [phone]

## 2022-09-10 NOTE — Progress Notes (Signed)
INTERNAL MEDICINE                                                                   Daily Progress Note    Patient:  Tommy Brown  Today date: September 10, 2022  Date of Admission:  09/08/2022    Interval History and Events of the last 24 hours:  No more  Nausea and vomiting, BP  better      Assessment and Plan:       Patient seen in follow up for multiple medical problems as listed below :  Intractable nausea and vomiting.  In setting of the history of the GI bleeding, unclear etiology suspect the gastritis, peptic ulcer.  Elevated blood pressure, started on Protonix and IV fluids, liquid diet, lipase within normal limit, CBC troponin negative          Hypertension urgency. New, Not on antihypertensive meds at home  Monitor pressure closely started on amlodipine  Bilateral renal artery stenosis.  CT scan showed bilateral severe renal artery stenosis.  Vascular surgery consult appreciated : TA reviewed and compared to CTA 04/2022, no change in bilateral renal arteries or changes in size of kidneys,   CKD III A monitor renal function renal dose of the medication avoid nephrotoxin  CAD s/p CABG continue aspirin and statin  Dyslipidemia check a lipid profile  Pancreatic cancer s/p Whipple's procedure  BPH continue Flomax  Moderate aortic stenosis  Chronic neck pain on gabapentin      DVT Prophylaxis:    Continue home regimen for otherwise chronic, stable medical conditions as noted above. Discussed with patient and or family regarding management, prognosis, treatment and complications of the patient's medical conditions in detail. All questions answered to the satisfaction of those individual(s) who also verbalized understanding of and agreement with the assessment and plan.     Code Status:  .Full Code     Disposition and Family:   Recommend to continue hospitalization. Discussed with patient and nursing staff.    Anticipated Date of Discharge: 1-2 day    Anticipated Disposition (home, SNF) : hh      ROS     As H&P and Above  Labwork and Ancillary Studies     All labs/tests/imaging reviewed.Spoke with the nurse regarding patient issues.    Labwork:    CBC w/Diff   Lab Results   Component Value Date/Time    WBC 7.3 09/10/2022 06:01 AM    HGB 13.8 09/10/2022 06:01 AM    HCT 41.8 09/10/2022 06:01 AM    PLT 146 09/10/2022 06:01 AM    MCV 89.7 09/10/2022 06:01 AM        Basic Metabolic Profile   Lab Results   Component Value Date/Time    NA 138 09/10/2022 06:01 AM    K 4.5 09/10/2022 06:01 AM    CL 106 09/10/2022 06:01 AM    CO2 21 09/10/2022 06:01 AM    BUN 21 09/10/2022 06:01 AM    GFRAA >60 09/08/2022 10:25 PM        Cardiac Enzymes   No results found for: "CPK", "BNP"   Arterial Blood Gases   @BRIEFLAB24 (ph,phi,pco2,pco2i,po2,po2i,hco3,hco3i,fio2,fio2i)@   Coagulation    @BRIEFLAB24 (PTTP,APTT,PTP,INR,INRT)@   Hepatic Function   No results found for: "  GGT", "GGTP"         Xray Result (most recent):  XR CHEST STANDARD TWO VW 09/08/2022    Narrative  Exam: Chest PA and lateral    Clinical indication: Chest Pain    Comparison: None    Results:  No consolidation.  No pleural effusions.    No pneumothorax. Sternotomy wires.    Heart normal.  Mediastinal contours are normal.    No free air is seen under the hemidiaphragms.   Osseous structures intact.    Impression  IMPRESSION:  No acute disease.    Electronically signed by: Earline Mayotte, MD 09/09/2022 12:11 AM EDT  Workstation ID: [id number]           MRI Result (most recent):  No results found for this or any previous visit from the past 3650 days.       CT Result (most recent):  CT ABDOMEN PELVIS W IV CONTRAST 09/09/2022    Narrative  EXAMINATION: CT ABDOMEN PELVIS W IV CONTRAST    CLINICAL INDICATION: nausea and vomiting    COMPARISON:  None    TECHNIQUE:  All CT exams at this facility use one or more dose reduction  techniques including automatic exposure control, ma/kV  adjustment per patient's  size, or  iterative reconstruction technique.    Multiple contiguous axial CT images at 5  mm increments were obtained from the  bases of the lungs to the  pubic symphysis contrast.    FINDINGS:    Visualized lungs/chest: Subpleural atelectasis versus scarring    Hepatobiliary: Numerous hepatic cysts. No biliary duct dilatation.  Cholecystectomy.    Kidneys/ureters: Multiple bilateral renal cysts. 30 or so in each kidney. 4 mm  stone lower pole left kidney. No hydronephrosis. No further imaging of renal  cysts recommended.    Esophagus/Stomach: Unremarkable.    Adrenal glands: Unremarkable.    Pancreas: Unremarkable.    Spleen: Unremarkable.    Vascular structures:  Marked atherosclerotic calcifications of the aorta and  abdominal vessels. High-grade stenosis due to calcifications of the renal  arteries bilaterally. 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    Peritoneum/mesentery/bowel:  Unremarkable. No dilated bowel. No free air. No  mesenteric mass. No bowel obstruction.    Retroperitoneal lymph nodes: Unremarkable    Pelvis:  Penile prosthesis.  Moderate prostate enlargement. Marked rectosigmoid  diverticulosis. Appendix normal.    Bones: No significant lesions. Marked degenerative disease mid lumbar spine.  Decompressive laminectomy with posterior fixation lower lumbar spine    Miscellaneous: None.    Impression  IMPRESSION:  1. No bowel obstruction.  2. High-grade stenosis of the renal arteries bilaterally. 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  3. Moderate prostate enlargement.  4. Marked rectosigmoid diverticulosis.  5. Multiple bilateral renal cysts. Nonobstructive left renal stone.  6. Numerous hepatic cysts.    Electronically signed by: Earline Mayotte, MD 09/09/2022 3:44 AM EDT  Workstation ID: [id number]         Current Inpatient Meds and Allergies     Medications:  Current Facility-Administered Medications   Medication Dose Route Frequency    tamsulosin (FLOMAX) capsule 0.4 mg  0.4 mg Oral  Daily    amLODIPine (NORVASC) tablet 5 mg  5 mg Oral Daily    temazepam (RESTORIL) capsule 15 mg  15 mg Oral Nightly PRN    gabapentin (NEURONTIN) capsule 300 mg  300 mg Oral TID    sodium chloride flush 0.9 % injection 5-40 mL  5-40  mL IntraVENous 2 times per day    sodium chloride flush 0.9 % injection 5-40 mL  5-40 mL IntraVENous PRN    0.9 % sodium chloride infusion   IntraVENous PRN    potassium chloride (KLOR-CON M) extended release tablet 40 mEq  40 mEq Oral PRN    Or    potassium chloride 20 MEQ/15ML (10%) oral solution 40 mEq  40 mEq Oral PRN    Or    potassium chloride 10 mEq/100 mL IVPB (Peripheral Line)  10 mEq IntraVENous PRN    magnesium sulfate 2000 mg in 50 mL IVPB premix  2,000 mg IntraVENous PRN    enoxaparin (LOVENOX) injection 40 mg  40 mg SubCUTAneous Daily    ondansetron (ZOFRAN-ODT) disintegrating tablet 4 mg  4 mg Oral Q8H PRN    Or    ondansetron (ZOFRAN) injection 4 mg  4 mg IntraVENous Q6H PRN    polyethylene glycol (GLYCOLAX) packet 17 g  17 g Oral Daily PRN    acetaminophen (TYLENOL) tablet 650 mg  650 mg Oral Q6H PRN    Or    acetaminophen (TYLENOL) suppository 650 mg  650 mg Rectal Q6H PRN    pantoprazole (PROTONIX) 40 mg in sodium chloride (PF) 0.9 % 10 mL injection  40 mg IntraVENous Q12H    ondansetron (ZOFRAN) injection 4 mg  4 mg IntraVENous Q6H PRN    prochlorperazine (COMPAZINE) injection 5 mg  5 mg IntraVENous Q6H PRN    pravastatin (PRAVACHOL) tablet 10 mg  10 mg Oral Nightly    aspirin EC tablet 81 mg  81 mg Oral Daily          Objective:       BP 138/79   Pulse (!) 103   Temp 98.2 F (36.8 C) (Oral)   Resp 17   Ht 1.778 m (5\' 10" )   Wt 68 kg (150 lb)   SpO2 98%   BMI 21.52 kg/m   Body mass index is 21.52 kg/m.      Intake/Output Summary (Last 24 hours) at 09/10/2022 1341  Last data filed at 09/10/2022 1128  Gross per 24 hour   Intake 350 ml   Output --   Net 350 ml       General:   Alert, cooperative, no distress, appears stated age.    Lungs:    Clear, no  wheeze,  rhonchi, rales   Chest wall:   No tenderness or deformity.    Heart:   Regular rate and rhythm, S1, S2 normal, no murmur, click, rub or gallop.    Abdomen:    Soft, non-tender. Bowel sounds normal. No masses,  No organomegaly.        Musculoskeleta : Normal range of motion in most of the joints   Extremities:  Extremities normal, atraumatic, no cyanosis or edema.    Pulses:  2+ and symmetric all extremities.    Skin:  Skin color, texture, turgor normal. No rashes or lesions    Neurologic  Psych:  : CNII-XII intact.    Normal affect and mood . No thoughts of harm to self or others              Total time spent with chart review, patient examination/education, discussion with staff on case,documentation and medication management / adjustment:   >35 minutes    It is always a pleasure to be involved in the clinical care of this patient.    Print production planner medical dictation software was used  for portions of this report.  Unintended voice transcription errors may have occurred.)    Rosemary Holms, MD  September 10, 2022  [facility]  Pager:  [phone]

## 2022-09-10 NOTE — Telephone Encounter (Signed)
Tommy Brown has an order for RUS      ALL MALE PATIENTS NEEDING AN MRI OF PELVIS OR PROSTATE, MUST BE SCHEDULED AT ONE OF THE FOLLOWING:    **MRI/CT (Preferred [facility])  **[facility] (Preferred [place])  [facility]  [facility] / [facility]      To be done at:  [facility]    Needed by:02/26/2023    Patient has a follow-up appointment:  Yes  March 09, 2022    If MRI, does patient have a pacemaker:   N/A    Order has been placed in connect care:  Yes    Is this a STAT order:  No      Rockney Ghee, MA

## 2022-09-10 NOTE — ED Notes (Signed)
TRANSFER - OUT REPORT:    Verbal report given to [facility] on Etheridge Whitmarsh being transferred to [phone] for routine progression of patient care       Report consisted of patient's Situation, Background, Assessment and   Recommendations(SBAR).     Information from the following report(s) Nurse Handoff Report was reviewed with the receiving nurse.  Kinder Assessment: Presents to emergency department  because of falls (Syncope, seizure, or loss of consciousness): No, Age > 70: Yes, Altered Mental Status, Intoxication with alcohol or substance confusion (Disorientation, impaired judgment, poor safety awaremess, or inability to follow instructions): No, Impaired Mobility: Ambulates or transfers with assistive devices or assistance; Unable to ambulate or transer.: No, Nursing Judgement: Yes  Lines:   Peripheral IV 09/08/22 Distal;Right Antecubital (Active)      Medications sent with patient from pharmacy: None to send  Patient belongings: Belongings sent to floor    Opportunity for questions and clarification was provided.      Patient transported with:  Otilio Carpen, Ree Kida, RN  09/10/22 [phone]

## 2022-09-10 NOTE — Progress Notes (Signed)
SBAR report from [place], Charity fundraiser at ED via telephone. Pt arrived at [address] [place], AAOX4, no confusion noted. No complains of SOB, no chest pain, no nausea/vomiting noted. On room air, tolerating well. Bed alarm on, lowered bed, call button within reach. For continuity of care.

## 2022-09-10 NOTE — Telephone Encounter (Addendum)
Called and informed patient of the message below. Patient would like imaging sent to [facility]. Patient scheduled for appointment in 6 months with KM.      ----- Message from Craig Staggers, PA-C sent at 09/06/2022  4:48 PM EDT -----  Please let patient know that his recent renal ultrasound has returned showing 1 small stone in each kidney.  The right kidney stone measures 4 mm and the left stone measures 6 mm.  Ultrasounds tend to overcall the size of stones, so these are likely mildly smaller than appearing on ultrasound. As such, we can follow up in 6 months with RUS prior. Thanks!

## 2022-09-11 MED ORDER — METOPROLOL TARTRATE 25 MG PO TABS
25 MG | ORAL_TABLET | Freq: Two times a day (BID) | ORAL | 3 refills | Status: DC
Start: 2022-09-11 — End: 2023-05-29

## 2022-09-11 MED ORDER — AMLODIPINE BESYLATE 2.5 MG PO TABS
2.5 MG | ORAL_TABLET | Freq: Every day | ORAL | 2 refills | Status: DC
Start: 2022-09-11 — End: 2023-05-29

## 2022-09-11 MED ORDER — PRAVASTATIN SODIUM 10 MG PO TABS
10 MG | ORAL_TABLET | Freq: Every evening | ORAL | 3 refills | Status: AC
Start: 2022-09-11 — End: ?

## 2022-09-11 MED FILL — GABAPENTIN 400 MG PO CAPS: 400 MG | ORAL | Qty: 1

## 2022-09-11 MED FILL — TAMSULOSIN HCL 0.4 MG PO CAPS: 0.4 MG | ORAL | Qty: 1

## 2022-09-11 MED FILL — ENOXAPARIN SODIUM 40 MG/0.4ML IJ SOSY: 40 MG/0.4ML | INTRAMUSCULAR | Qty: 0.4

## 2022-09-11 MED FILL — SODIUM CHLORIDE FLUSH 0.9 % IV SOLN: 0.9 % | INTRAVENOUS | Qty: 10

## 2022-09-11 MED FILL — METOPROLOL TARTRATE 25 MG PO TABS: 25 MG | ORAL | Qty: 1

## 2022-09-11 MED FILL — PRAVASTATIN SODIUM 10 MG PO TABS: 10 MG | ORAL | Qty: 1

## 2022-09-11 MED FILL — PANTOPRAZOLE SODIUM 40 MG IV SOLR: 40 MG | INTRAVENOUS | Qty: 40

## 2022-09-11 MED FILL — ASPIRIN LOW DOSE 81 MG PO TBEC: 81 MG | ORAL | Qty: 1

## 2022-09-11 MED FILL — AMLODIPINE BESYLATE 5 MG PO TABS: 5 MG | ORAL | Qty: 1

## 2022-09-11 NOTE — Telephone Encounter (Signed)
Fax scheduled for 01/27/23 for RUS to [facility] [phone]

## 2022-09-11 NOTE — Discharge Instructions (Signed)
DISCHARGE SUMMARY from Nurse    PATIENT INSTRUCTIONS:    *  Please give a list of your current medications to your Primary Care Provider.    *  Please update this list whenever your medications are discontinued, doses are      changed, or new medications (including over-the-counter products) are added.    *  Please carry medication information at all times in case of emergency situations.    These are general instructions for a healthy lifestyle:    No smoking/ No tobacco products/ Avoid exposure to second hand smoke  Surgeon General's Warning:  Quitting smoking now greatly reduces serious risk to your health.    Obesity, smoking, and sedentary lifestyle greatly increases your risk for illness    A healthy diet, regular physical exercise & weight monitoring are important for maintaining a healthy lifestyle    You may be retaining fluid if you have a history of heart failure or if you experience any of the following symptoms:  Weight gain of 3 pounds or more overnight or 5 pounds in a week, increased swelling in our hands or feet or shortness of breath while lying flat in bed.  Please call your doctor as soon as you notice any of these symptoms; do not wait until your next office visit.    ___________________________________________________________________________________________________________________________________

## 2022-09-11 NOTE — Care Coordination-Inpatient (Signed)
Discharge Plan: HWFS  Discharge Date:  09/11/2022     Home Health Needed:  No, pt ambulating in hallway w/o difficulty and works FT.  No PT/OT evals done.  DME needed and ordered for Discharge:  No   TCC Referral: Yes  Medication Assistance given: No      Change(MDC/SSDI) Referral/outcome: N/A     Method of Transportation:  Wife at bedside  Family, MD, patient, Staff Nurse and Discharge aware of clearance for dc: Yes

## 2022-09-11 NOTE — Discharge Summary (Signed)
Discharge Summary       Patient: Tommy Brown Age: 77 y.o. DOB: [birth year] MR#: [id number] SSN: [id number]  PCP on record: Cleatis Polka, MD  Admit date: 09/08/2022  Discharge date: 09/11/2022    Admission Diagnoses:     Leg cramps [R25.2]  Intractable nausea and vomiting [R11.2]  Chest pain, unspecified type [R07.9]    -    Discharge Diagnoses:      Nausea and vomiting  Hypertension urgency  Bilateral renal artery stenosis  Chest Pain   CKD III A  CAD s/p CABG  Hypertension  dyslipidemia  Pancreatic cancer s/p Whipple's procedure  BPH  Moderate aortic stenosis  Chronic neck pain on gabapentin      Medical History   Per H&P,  Tommy Brown is a 77 y.o.  male with history significant for hypertension, coronary disease status post CABG, hyperlipidemia, BPH, moderate aortic stenosis, history of the pancreas cancer status post Whipple procedure presented with a complaint of the nauseous and vomiting .  Patient report he has been sick all day long with a nauseous vomiting of the workup in yesterday morning morning.  Also had a chest pressure on the left side which resolved with the baby aspirin.  Also reported his blood pressure has been elevated and the headache but denies any chest pain or dyspnea, denies any fever or chills denies any blood in stool abdominal pain.  In the emergency room, her blood pressure was 197/101 heart rate of 75 respiratory 17 afebrile eyes O2 sat 92% chest CT no evidence of the pulmonary embolism and abdominal CT High-grade stenosis of the renal arteries bilaterally. 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  Patient was admitted to the hospital for further management     Hospital Course by Problem     Patient seen in follow up for multiple medical problems as listed below :  Intractable nausea and vomiting.  In setting of the history of the GI bleeding, unclear etiology suspect the gastritis, peptic ulcer.  Elevated blood pressure, started on Protonix and IV fluids, liquid diet, lipase  within normal limit, CBC troponin negative. Resolved and tolerated diet          Hypertension urgency. New, Not on antihypertensive meds at home  Monitor pressure closely started on amlodipine, Resume Norvasc 2.5 mg daily, Lopressor 25 mg Bid   Bilateral renal artery stenosis.  CT scan showed bilateral severe renal artery stenosis.  Vascular surgery consult appreciated : TA reviewed and compared to CTA 04/2022, no change in bilateral renal arteries or changes in size of kidneys,  F/U with Vascular as outpatient   CKD III A monitor renal function renal dose of the medication avoid nephrotoxin  CAD s/p CABG continue aspirin and statin  Dyslipidemia check a lipid profile  Pancreatic cancer s/p Whipple's procedure  BPH continue Flomax  Moderate aortic stenosis  Chronic neck pain on gabapentin          Continue home regimen for otherwise chronic, stable medical conditions as noted above. Discussed with patient and or family regarding management, prognosis, treatment and complications of the patient's medical conditions in detail. All questions answered to the satisfaction of those individual(s) who also verbalized understanding of and agreement with the assessment and plan.       Stable to D/C from hospital      ROS      As H&P and Above  [facility] and Ancillary Studies      All labs/tests/imaging reviewed.Spoke with the nurse regarding  patient issues.     Labwork:     CBC w/Diff         Lab Results   Component Value Date/Time     WBC 7.3 09/10/2022 06:01 AM     HGB 13.8 09/10/2022 06:01 AM     HCT 41.8 09/10/2022 06:01 AM     PLT 146 09/10/2022 06:01 AM     MCV 89.7 09/10/2022 06:01 AM         Basic Metabolic Profile         Lab Results   Component Value Date/Time     NA 138 09/10/2022 06:01 AM     K 4.5 09/10/2022 06:01 AM     CL 106 09/10/2022 06:01 AM     CO2 21 09/10/2022 06:01 AM     BUN 21 09/10/2022 06:01 AM     GFRAA >60 09/08/2022 10:25 PM               Cardiac Enzymes     No results found for: "CPK", "BNP"      Arterial Blood Gases   @BRIEFLAB24 (ph,phi,pco2,pco2i,po2,po2i,hco3,hco3i,fio2,fio2i)@   Coagulation     @BRIEFLAB24 (PTTP,APTT,PTP,INR,INRT)@    Hepatic Function   No results found for: "GGT", "GGTP"            Xray Result (most recent):  XR CHEST STANDARD TWO VW 09/08/2022     Narrative  Exam: Chest PA and lateral     Clinical indication: Chest Pain     Comparison: None     Results:  No consolidation.  No pleural effusions.     No pneumothorax. Sternotomy wires.     Heart normal.  Mediastinal contours are normal.     No free air is seen under the hemidiaphragms.   Osseous structures intact.     Impression  IMPRESSION:  No acute disease.     Electronically signed by: Earline Mayotte, MD 09/09/2022 12:11 AM EDT  Workstation ID: [id number]              MRI Result (most recent):  No results found for this or any previous visit from the past 3650 days.        CT Result (most recent):  CT ABDOMEN PELVIS W IV CONTRAST 09/09/2022     Narrative  EXAMINATION: CT ABDOMEN PELVIS W IV CONTRAST     CLINICAL INDICATION: nausea and vomiting     COMPARISON:  None     TECHNIQUE:  All CT exams at this facility use one or more dose reduction  techniques including automatic exposure control, ma/kV  adjustment per patient's  size, or iterative reconstruction technique.     Multiple contiguous axial CT images at 5  mm increments were obtained from the  bases of the lungs to the  pubic symphysis contrast.     FINDINGS:     Visualized lungs/chest: Subpleural atelectasis versus scarring     Hepatobiliary: Numerous hepatic cysts. No biliary duct dilatation.  Cholecystectomy.     Kidneys/ureters: Multiple bilateral renal cysts. 30 or so in each kidney. 4 mm  stone lower pole left kidney. No hydronephrosis. No further imaging of renal  cysts recommended.     Esophagus/Stomach: Unremarkable.     Adrenal glands: Unremarkable.     Pancreas: Unremarkable.     Spleen: Unremarkable.     Vascular structures:  Marked atherosclerotic calcifications  of the aorta and  abdominal vessels. High-grade stenosis due to calcifications of the renal  arteries bilaterally. Infrarenal short segmental aortic aneurysm  2.5 x 2.5 cm.     Peritoneum/mesentery/bowel:  Unremarkable. No dilated bowel. No free air. No  mesenteric mass. No bowel obstruction.     Retroperitoneal lymph nodes: Unremarkable     Pelvis:  Penile prosthesis.  Moderate prostate enlargement. Marked rectosigmoid  diverticulosis. Appendix normal.     Bones: No significant lesions. Marked degenerative disease mid lumbar spine.  Decompressive laminectomy with posterior fixation lower lumbar spine     Miscellaneous: None.     Impression  IMPRESSION:  1. No bowel obstruction.  2. High-grade stenosis of the renal arteries bilaterally. 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  3. Moderate prostate enlargement.  4. Marked rectosigmoid diverticulosis.  5. Multiple bilateral renal cysts. Nonobstructive left renal stone.  6. Numerous hepatic cysts.     Electronically signed by: Earline Mayotte, MD 09/09/2022 3:44 AM EDT  Workstation ID: [id number]           Current Inpatient Meds and Allergies      Medications:         Current Facility-Administered Medications   Medication Dose Route Frequency    tamsulosin (FLOMAX) capsule 0.4 mg  0.4 mg Oral Daily    amLODIPine (NORVASC) tablet 5 mg  5 mg Oral Daily    temazepam (RESTORIL) capsule 15 mg  15 mg Oral Nightly PRN    gabapentin (NEURONTIN) capsule 300 mg  300 mg Oral TID    sodium chloride flush 0.9 % injection 5-40 mL  5-40 mL IntraVENous 2 times per day    sodium chloride flush 0.9 % injection 5-40 mL  5-40 mL IntraVENous PRN    0.9 % sodium chloride infusion   IntraVENous PRN    potassium chloride (KLOR-CON M) extended release tablet 40 mEq  40 mEq Oral PRN     Or    potassium chloride 20 MEQ/15ML (10%) oral solution 40 mEq  40 mEq Oral PRN     Or    potassium chloride 10 mEq/100 mL IVPB (Peripheral Line)  10 mEq IntraVENous PRN    magnesium sulfate 2000 mg in 50  mL IVPB premix  2,000 mg IntraVENous PRN    enoxaparin (LOVENOX) injection 40 mg  40 mg SubCUTAneous Daily    ondansetron (ZOFRAN-ODT) disintegrating tablet 4 mg  4 mg Oral Q8H PRN     Or    ondansetron (ZOFRAN) injection 4 mg  4 mg IntraVENous Q6H PRN    polyethylene glycol (GLYCOLAX) packet 17 g  17 g Oral Daily PRN    acetaminophen (TYLENOL) tablet 650 mg  650 mg Oral Q6H PRN     Or    acetaminophen (TYLENOL) suppository 650 mg  650 mg Rectal Q6H PRN    pantoprazole (PROTONIX) 40 mg in sodium chloride (PF) 0.9 % 10 mL injection  40 mg IntraVENous Q12H    ondansetron (ZOFRAN) injection 4 mg  4 mg IntraVENous Q6H PRN    prochlorperazine (COMPAZINE) injection 5 mg  5 mg IntraVENous Q6H PRN    pravastatin (PRAVACHOL) tablet 10 mg  10 mg Oral Nightly    aspirin EC tablet 81 mg  81 mg Oral Daily            Objective:        BP 138/79   Pulse (!) 103   Temp 98.2 F (36.8 C) (Oral)   Resp 17   Ht 1.778 m (5\' 10" )   Wt 68 kg (150 lb)   SpO2 98%   BMI 21.52 kg/m  Body mass index is 21.52 kg/m.        Intake/Output Summary (Last 24 hours) at 09/10/2022 1341  Last data filed at 09/10/2022 1128      Gross per 24 hour   Intake 350 ml   Output --   Net 350 ml         General:   Alert, cooperative, no distress, appears stated age.    Lungs:    Clear, no  wheeze, rhonchi, rales   Chest wall:   No tenderness or deformity.    Heart:   Regular rate and rhythm, S1, S2 normal, no murmur, click, rub or gallop.    Abdomen:    Soft, non-tender. Bowel sounds normal. No masses,  No organomegaly.        Musculoskeleta : Normal range of motion in most of the joints   Extremities:  Extremities normal, atraumatic, no cyanosis or edema.    Pulses:  2+ and symmetric all extremities.    Skin:  Skin color, texture, turgor normal. No rashes or lesions    Neurologic  Psych:  : CNII-XII intact.    Normal affect and mood . No thoughts of harm to self or others                Total time spent with chart review, patient examination/education,  discussion with staff on case,documentation and medication management / adjustment:   >35 minutes     It is always a pleasure to be involved in the clinical care of this patient.     Print production planner medical dictation software was used for portions of this report.  Unintended voice transcription errors may have occurred.)     Rosemary Holms, MD  September 10, 2022  [facility]  Pager:  [phone]                     Today's examination of the patient revealed:     Objective:   VS: BP (!) 114/90   Pulse 78   Temp 98 F (36.7 C) (Temporal)   Resp 22   Ht 1.778 m (5\' 10" )   Wt 68 kg (150 lb)   SpO2 98%   BMI 21.52 kg/m    Tmax/24hrs: Temp (24hrs), Avg:98.1 F (36.7 C), Min:97.5 F (36.4 C), Max:98.6 F (37 C)     Input/Output:   Intake/Output Summary (Last 24 hours) at 09/11/2022 1454  Last data filed at 09/10/2022 1539  Gross per 24 hour   Intake --   Output 450 ml   Net -450 ml       General appearance: no distress  Eyes: sclera anicteric  ENT: no oral lesions, thyroid normal  Skin: no spider angiomata, jaundice,   Respiratory: clear to auscultation bilaterally  Cardiovascular: regular heart rate, no murmurs, no JVD  Abdomen: soft, non-tender, no rebound  Extremities: no muscle wasting, no gross arthritic changes  GU: not examined  Neurologic: alert and oriented, cranial nerves grossly intact,         Labs:      Lab Results   Component Value Date/Time    NA 138 09/10/2022 06:01 AM    K 4.5 09/10/2022 06:01 AM    CL 106 09/10/2022 06:01 AM    CO2 21 09/10/2022 06:01 AM    BUN 21 09/10/2022 06:01 AM    CREATININE 1.19 09/10/2022 06:01 AM    GLUCOSE 91 09/10/2022 06:01 AM    CALCIUM 8.8 09/10/2022 06:01 AM  Lab Results   Component Value Date    WBC 7.3 09/10/2022    HGB 13.8 09/10/2022    HCT 41.8 09/10/2022    MCV 89.7 09/10/2022    PLT 146 09/10/2022    RBC 4.66 09/10/2022    MCH 29.6 09/10/2022    MCHC 33.0 09/10/2022    RDW 55.3 (H) 09/10/2022             Additional Data Reviewed:          Xray Result  (most recent):  XR CHEST STANDARD TWO VW 09/08/2022    Narrative  Exam: Chest PA and lateral    Clinical indication: Chest Pain    Comparison: None    Results:  No consolidation.  No pleural effusions.    No pneumothorax. Sternotomy wires.    Heart normal.  Mediastinal contours are normal.    No free air is seen under the hemidiaphragms.   Osseous structures intact.    Impression  IMPRESSION:  No acute disease.    Electronically signed by: Earline Mayotte, MD 09/09/2022 12:11 AM EDT  Workstation ID: [id number]          MRI Result (most recent):  No results found for this or any previous visit from the past 3650 days.            CT Result (most recent):  CT ABDOMEN PELVIS W IV CONTRAST 09/09/2022    Narrative  EXAMINATION: CT ABDOMEN PELVIS W IV CONTRAST    CLINICAL INDICATION: nausea and vomiting    COMPARISON:  None    TECHNIQUE:  All CT exams at this facility use one or more dose reduction  techniques including automatic exposure control, ma/kV  adjustment per patient's  size, or iterative reconstruction technique.    Multiple contiguous axial CT images at 5  mm increments were obtained from the  bases of the lungs to the  pubic symphysis contrast.    FINDINGS:    Visualized lungs/chest: Subpleural atelectasis versus scarring    Hepatobiliary: Numerous hepatic cysts. No biliary duct dilatation.  Cholecystectomy.    Kidneys/ureters: Multiple bilateral renal cysts. 30 or so in each kidney. 4 mm  stone lower pole left kidney. No hydronephrosis. No further imaging of renal  cysts recommended.    Esophagus/Stomach: Unremarkable.    Adrenal glands: Unremarkable.    Pancreas: Unremarkable.    Spleen: Unremarkable.    Vascular structures:  Marked atherosclerotic calcifications of the aorta and  abdominal vessels. High-grade stenosis due to calcifications of the renal  arteries bilaterally. 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    Peritoneum/mesentery/bowel:  Unremarkable. No dilated bowel. No free  air. No  mesenteric mass. No bowel obstruction.    Retroperitoneal lymph nodes: Unremarkable    Pelvis:  Penile prosthesis.  Moderate prostate enlargement. Marked rectosigmoid  diverticulosis. Appendix normal.    Bones: No significant lesions. Marked degenerative disease mid lumbar spine.  Decompressive laminectomy with posterior fixation lower lumbar spine    Miscellaneous: None.    Impression  IMPRESSION:  1. No bowel obstruction.  2. High-grade stenosis of the renal arteries bilaterally. 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  3. Moderate prostate enlargement.  4. Marked rectosigmoid diverticulosis.  5. Multiple bilateral renal cysts. Nonobstructive left renal stone.  6. Numerous hepatic cysts.    Electronically signed by: Earline Mayotte, MD 09/09/2022 3:44 AM EDT  Workstation ID: [id number]             Condition:  stable    Diet:   Cardiac diet  Disposition:    [] Home   [x] Home with Home Health   [] SNF/NH   [] Rehab   [] Home with family   [] Alternate Facility:____________________      Discharge Medications:          Medication List        START taking these medications      amLODIPine 2.5 MG tablet  Commonly known as: NORVASC  Take 1 tablet by mouth daily  Start taking on: September 12, 2022     metoprolol tartrate 25 MG tablet  Commonly known as: LOPRESSOR  Take 1 tablet by mouth 2 times daily     pravastatin 10 MG tablet  Commonly known as: PRAVACHOL  Take 1 tablet by mouth nightly            CONTINUE taking these medications      allopurinol 300 MG tablet  Commonly known as: ZYLOPRIM     aspirin 81 MG EC tablet     bimatoprost 0.01 % Soln ophthalmic drops  Commonly known as: LUMIGAN     ferrous sulfate 325 (65 Fe) MG tablet  Commonly known as: IRON 325     gabapentin 600 MG tablet  Commonly known as: NEURONTIN     lipase-protease-amylase 24000-76000 units delayed release capsule  Commonly known as: CREON     magnesium oxide 400 (240 Mg) MG tablet  Commonly known as: MAG-OX     omeprazole 20 MG delayed  release capsule  Commonly known as: PRILOSEC     tamsulosin 0.4 MG capsule  Commonly known as: FLOMAX     therapeutic multivitamin-minerals tablet     timolol 0.5 % ophthalmic solution  Commonly known as: BETIMOL            STOP taking these medications      methocarbamol 500 MG tablet  Commonly known as: ROBAXIN               Where to Get Your Medications        These medications were sent to [id number] - [place], [place] - [address] - P [phone] Carmon Ginsberg [phone]  [address], [place] [address]      Phone: [phone]   amLODIPine 2.5 MG tablet  metoprolol tartrate 25 MG tablet  pravastatin 10 MG tablet            Follow-up Appointments and instructions:   Your PCP: Cleatis Polka, MD, within 3-5 days    Follow-up Information       Follow up With Specialties Details Why Contact Info    Lavenia Atlas Marciano Sequin, MD Vascular Surgery Follow up in 2 month(s)  [address]  [address]  [place] [address]  [phone]                        Consults: Vascular     Treatment Team: Treatment Team: Attending Provider: Rosemary Holms, MD; Consulting Physician: Rosemary Holms, MD; Registered Nurse: [place], Tracy G, RN; Case Manager: Lavonna Rua, RN    Significant Diagnostic Studies:       Please follow-up on tests/labs that are still pending:    Thank you Dr Lynnell Dike, Herma Carson, MD for allowing [place] to participate in the care of Gammon Lenna Gilford     [facility] medical dictation software was used for portions of this report.  Unintended voice transcription errors may  have occurred.)      >42 minutes spent coordinating this discharge (review instructions/follow-up, prescriptions)    Signed:    [facility]  Rosemary Holms, MD  09/11/2022  2:54 PM

## 2022-09-17 ENCOUNTER — Ambulatory Visit: Payer: MEDICARE | Primary: Family Medicine

## 2022-09-18 ENCOUNTER — Ambulatory Visit: Payer: MEDICARE | Primary: Family Medicine

## 2022-10-02 ENCOUNTER — Inpatient Hospital Stay: Admit: 2022-10-02 | Payer: MEDICARE | Primary: Family Medicine

## 2022-10-02 ENCOUNTER — Encounter

## 2022-10-02 DIAGNOSIS — M542 Cervicalgia: Secondary | ICD-10-CM

## 2023-01-27 ENCOUNTER — Encounter: Attending: Student in an Organized Health Care Education/Training Program | Primary: Family Medicine

## 2023-01-29 ENCOUNTER — Encounter: Admit: 2023-01-29

## 2023-01-29 DIAGNOSIS — N200 Calculus of kidney: Secondary | ICD-10-CM

## 2023-02-07 ENCOUNTER — Inpatient Hospital Stay: Admit: 2023-02-07 | Payer: MEDICARE | Primary: Family Medicine

## 2023-02-07 DIAGNOSIS — N200 Calculus of kidney: Secondary | ICD-10-CM

## 2023-02-07 NOTE — Other (Signed)
 Reviewed. Will discuss with patient at visit on 03/10/23.    Helane Gunther, PA-C  [facility] [place]

## 2023-03-10 ENCOUNTER — Encounter: Primary: Family Medicine

## 2023-05-05 ENCOUNTER — Encounter

## 2023-05-05 ENCOUNTER — Inpatient Hospital Stay: Payer: MEDICARE | Primary: Family Medicine

## 2023-05-05 ENCOUNTER — Inpatient Hospital Stay
Admit: 2023-05-05 | Discharge: 2023-05-05 | Disposition: A | Discharge status: Home / Self Care | Payer: MEDICARE | Attending: Emergency Medicine

## 2023-05-05 DIAGNOSIS — R1030 Lower abdominal pain, unspecified: Secondary | ICD-10-CM

## 2023-05-05 LAB — CBC WITH AUTO DIFFERENTIAL
Basophils: 0.1 % (ref 0–3)
Eosinophils: 0.4 % (ref 0–5)
Hematocrit: 42.2 % (ref 36.0–55.0)
Hemoglobin: 13.5 g/dL (ref 13.0–18.0)
Immature Granulocytes %: 0.6 % (ref 0.0–3.0)
Lymphocytes: 17.5 % — ABNORMAL LOW (ref 28–48)
MCH: 30.8 pg (ref 23.0–34.6)
MCHC: 32 g/dL (ref 30.0–36.0)
MCV: 96.1 fL (ref 80.0–98.0)
MPV: 11 fL — ABNORMAL HIGH (ref 6.0–10.0)
Monocytes: 7.4 % (ref 1–13)
Neutrophils Segmented: 74 % — ABNORMAL HIGH (ref 34–64)
Nucleated RBCs: 0 (ref 0–0)
Platelets: 193 10*3/uL (ref 140–450)
RBC: 4.39 M/uL (ref 3.80–5.70)
RDW: 51.8 — ABNORMAL HIGH (ref 35.1–43.9)
WBC: 9.1 10*3/uL (ref 4.0–11.0)

## 2023-05-05 LAB — COMPREHENSIVE METABOLIC PANEL
ALT: 22 U/L (ref 10–49)
AST: 46 U/L — ABNORMAL HIGH (ref 0.0–33.9)
Albumin: 3.1 g/dL — ABNORMAL LOW (ref 3.4–5.0)
Alkaline Phosphatase: 154 U/L — ABNORMAL HIGH (ref 46–116)
Anion Gap: 7 mmol/L (ref 5–15)
BUN: 10 mg/dL (ref 9–23)
CO2: 25 meq/L (ref 20–31)
Calcium: 8.8 mg/dL (ref 8.7–10.4)
Chloride: 104 meq/L (ref 98–107)
Creatinine: 1.4 mg/dL — ABNORMAL HIGH (ref 0.70–1.30)
GFR African American: >60
GFR Non-African American: 52
Glucose: 101 mg/dL (ref 74–106)
Potassium: 5.1 meq/L (ref 3.5–5.1)
Sodium: 136 meq/L (ref 136–145)
Total Bilirubin: 0.6 mg/dL (ref 0.30–1.20)
Total Protein: 6.5 g/dL (ref 5.7–8.2)

## 2023-05-05 LAB — LIPASE: Lipase: 24 U/L (ref 12–53)

## 2023-05-05 NOTE — ED Notes (Signed)
 Patient requested IV be taken out and wanting to leave. Patient IV taken out per request and patient left.      Pamalee Leyden, RN  05/05/23 [phone]

## 2023-05-05 NOTE — ED Provider Notes (Signed)
 [facility]  Emergency Department Treatment Report        Patient: Tommy Brown Age: 78 y.o. Sex: male    Date of Birth: January 11, 1946 Admit Date: 05/05/2023 PCP: Cleatis Polka, MD   MRN: [id number]  CSN: [id number]     Room: H09/H09 Time Dictated: 6:12 PM      Attending MD:  Erling Conte, MD   APC:  Marlana Salvage PA-C    Chief Complaint   Abdominal pain    History of Present Illness   78 y.o. male to the emergency department from his primary care doctor's office for consideration of CT scan of his abdomen.  Patient tells me that he had onset of lower abdominal pain yesterday evening that progressively worsened throughout the night and into this morning.  Pain is constant, aching, across his lower abdomen.  He has never had this before.  States he had a bowel movement about 1 or 2 hours ago that did not worsen the pain and did not improve it.  He said normal urine output, feels he is emptying his bladder, denies any pain or frequency to urinate.  Denies flank pain.  He has been eating and drinking normally can [facility] denies any postprandial pain, denies nausea or vomiting.      Review of Systems   Review of Systems  See HPI     Past Medical/Surgical History     Past Medical History:   Diagnosis Date    Arthritis     Benign prostate hyperplasia     Bladder infection     Chronic kidney disease     Coronary arteriosclerosis     Coronary atherosclerosis of artery bypass graft     Decreased testosterone level     Diverticular disease     Essential hypertension     Gout     History of acute renal failure     History of anemia     History of malignant neoplasm of pancreas     Hx of CABG     Kidney stone     Kidney stones     Neuropathy     NECK, SHOULDERS    Pancreatic cancer (HCC)     Sepsis (HCC)      Past Surgical History:   Procedure Laterality Date    CABG, ARTERY-VEIN, FOUR  1995    CAPSULE ENDOSCOPY N/A 05/23/2022    BOWEL SMALL CAPSULE ENDOSCOPY performed by Franky Macho, MD at  [facility] ENDOSCOPY    CARDIAC CATHETERIZATION  2006    CERVICAL FUSION      COLONOSCOPY N/A 07/10/2020    DIAGNOSTIC COLONOSCOPY polypectomy w/hot snare, cold snare , Clip x 1  performed by Jonna Coup, MD at [facility] ENDOSCOPY    COLONOSCOPY      COLONOSCOPY N/A 05/17/2022    COLONOSCOPY DIAGNOSTIC aborted at 25cm performed by Jonna Coup, MD at [facility] ENDOSCOPY    CORONARY ARTERY BYPASS GRAFT  1995    LUMBAR DISCECTOMY      OTHER SURGICAL HISTORY  01/01/2011    whipple procedure done for pancreatic cancer    UPPER GASTROINTESTINAL ENDOSCOPY N/A 05/17/2022    ESOPHAGOGASTRODUODENOSCOPY DIAGNOSTIC ONLY performed by Jonna Coup, MD at [facility] ENDOSCOPY    UROLOGICAL SURGERY  2012    Percutaneous extraction of a kidney stone w/ fragmentation procedure      Social History     Social History     Socioeconomic History  Marital status: Married   Tobacco Use    Smoking status: Never     Passive exposure: Never    Smokeless tobacco: Never   Vaping Use    Vaping status: Never Used   Substance and Sexual Activity    Alcohol use: Never    Drug use: Never    Sexual activity: Defer   Social History Narrative    ** Merged History Encounter **         ** Merged History Encounter **        Social Drivers of Health     Food Insecurity: No Food Insecurity (09/09/2022)    Hunger Vital Sign     Worried About Running Out of Food in the Last Year: Never true     Ran Out of Food in the Last Year: Never true   Transportation Needs: No Transportation Needs (09/09/2022)    PRAPARE - Therapist, art (Medical): No     Lack of Transportation (Non-Medical): No   Housing Stability: Low Risk  (09/09/2022)    Housing Stability Vital Sign     Unable to Pay for Housing in the Last Year: No     Number of Places Lived in the Last Year: 1     Unstable Housing in the Last Year: No     Family History     Family History   Problem Relation Age of Onset    Emphysema Brother     Alcohol Abuse Father     Heart Disease Brother      Current  Medications     Discharge Medication List as of 05/05/2023  3:54 PM        CONTINUE these medications which have NOT CHANGED    Details   amLODIPine (NORVASC) 2.5 MG tablet Take 1 tablet by mouth daily, Disp-30 tablet, R-2Normal      metoprolol tartrate (LOPRESSOR) 25 MG tablet Take 1 tablet by mouth 2 times daily, Disp-60 tablet, R-3Normal      pravastatin (PRAVACHOL) 10 MG tablet Take 1 tablet by mouth nightly, Disp-30 tablet, R-3Normal      bimatoprost (LUMIGAN) 0.01 % SOLN ophthalmic drops Place 1 drop into the right eye nightlyHistorical Med      timolol (BETIMOL) 0.5 % ophthalmic solution Place 1 drop into the left eye at bedtimeHistorical Med      ferrous sulfate (IRON 325) 325 (65 Fe) MG tablet Take 1 tablet by mouth daily (with breakfast)Historical Med      lipase-protease-amylase (CREON) 24000-76000 units delayed release capsule Take 1 capsule by mouth 3 times daily (with meals) And with snacks, Oral, 3 TIMES DAILY WITH MEALS, Historical Med      !! allopurinol (ZYLOPRIM) 300 MG tablet Take 1 tablet by mouth dailyHistorical Med      !! tamsulosin (FLOMAX) 0.4 MG capsule Take 1 capsule by mouth in the morning and at bedtimeHistorical Med      !! gabapentin (NEURONTIN) 600 MG tablet Take 1 tablet by mouth 3 times daily. Max Daily Amount: 1,800 mgHistorical Med      !! omeprazole (PRILOSEC) 20 MG delayed release capsule Take 2 capsules by mouth daily For gerdsHistorical Med      aspirin 81 MG EC tablet Take 1 tablet by mouth dailyHistorical Med      Multiple Vitamins-Minerals (THERAPEUTIC MULTIVITAMIN-MINERALS) tablet Take 1 tablet by mouth daily Centrum Silver for menHistorical Med      magnesium oxide (MAG-OX) 400 (240 Mg) MG tablet  Take 1 tablet by mouth dailyHistorical Med      testosterone cypionate (DEPOTESTOTERONE CYPIONATE) 200 MG/ML injection INJECT 0.5 MILLILITER BY INTRAMUSCULAR ROUTE EVERY 2 WEEKSHistorical Med      Omega-3 Fatty Acids-Hemp Extra (HEMP MONOPURE PO) Take by mouthHistorical Med       aspirin 81 MG chewable tablet Take 1 tablet by mouth daily RESUME THIS AFTER ONE WEEK, ONLY IF NO MORE BLEEDING IN BOWEL MOVEMENTS, Disp-30 tablet, R-3Historical Med      !! omeprazole (PRILOSEC) 40 MG delayed release capsule Take 1 capsule by mouth dailyHistorical Med      acetaminophen (TYLENOL) 325 MG tablet Take 2 tablets by mouth every 6 hours as neededHistorical Med      !! allopurinol (ZYLOPRIM) 300 MG tablet Take 1 tablet by mouth dailyHistorical Med      !! gabapentin (NEURONTIN) 600 MG tablet Take 2 tablets by mouth 4 times daily.Historical Med      !! tamsulosin (FLOMAX) 0.4 MG capsule Take 2 capsules by mouth dailyHistorical Med      temazepam (RESTORIL) 15 MG capsule Take 1 capsule by mouth nightly as needed for Anxiety or Sleep.Historical Med       !! - Potential duplicate medications found. Please discuss with provider.          Allergies   Codeine, Oxycodone, Amoxicillin, Hydroxyzine pamoate, Meperidine, Metoclopramide, Morphine, Oxycodone-acetaminophen, Prochlorperazine, Amoxicillin, Codeine, Hydroxyzine hcl, Hydroxyzine pamoate, Metoclopramide, Other, Other, Oxycodone-acetaminophen, Prochlorperazine, Meperidine, and Morphine    Physical Exam     ED Triage Vitals [05/05/23 1329]   BP Systolic BP Percentile Diastolic BP Percentile Temp Temp Source Pulse Respirations SpO2   (!) 155/93 -- -- 97.9 F (36.6 C) Oral 81 12 100 %      Height Weight - Scale         -- 65.8 kg (145 lb)             Physical Exam  Constitutional:       Appearance: He is not ill-appearing, toxic-appearing or diaphoretic.   HENT:      Head: Normocephalic and atraumatic.   Cardiovascular:      Rate and Rhythm: Normal rate and regular rhythm.   Pulmonary:      Effort: Pulmonary effort is normal.   Abdominal:      General: Abdomen is flat. Bowel sounds are normal. There is no distension.      Palpations: Abdomen is soft.      Comments: Complains of pain across his lower abdomen  Not seemingly worse on palpation  No rebound or  gaurding   Skin:     General: Skin is warm and dry.   Neurological:      General: No focal deficit present.      Mental Status: He is alert and oriented to person, place, and time.   Psychiatric:         Mood and Affect: Mood normal.       Impression and Management Plan     Patient presenting to the ED for evaluation of lower abdominal pain that started last evening and progressed this morning.  He complains of pain across his lower abdomen, has a history of diverticulitis and kidney stones both of which are in the differential.  Consider UTI.  Consider other intra-abdominal process such as colitis, appendicitis.  Patient does not have significant tenderness on my exam or surgical abdomen.  Sent by PCP for CT imaging, will proceed with this as well as laboratory studies to  further characterize.    Diagnostic Studies     Lab:   Recent Results (from the past 12 hours)   CBC with Diff    Collection Time: 05/05/23  2:40 PM   Result Value Ref Range    WBC 9.1 4.0 - 11.0 1000/mm3    RBC 4.39 3.80 - 5.70 M/uL    Hemoglobin 13.5 13.0 - 18.0 gm/dl    Hematocrit 96.0 45.4 - 55.0 %    MCV 96.1 80.0 - 98.0 fL    MCH 30.8 23.0 - 34.6 pg    MCHC 32.0 30.0 - 36.0 gm/dl    Platelets 098 119 - 450 1000/mm3    MPV 11.0 (H) 6.0 - 10.0 fL    RDW 51.8 (H) 35.1 - 43.9      Nucleated RBCs 0 0 - 0      Immature Granulocytes % 0.6 0.0 - 3.0 %    Neutrophils Segmented 74.0 (H) 34 - 64 %    Lymphocytes 17.5 (L) 28 - 48 %    Monocytes 7.4 1 - 13 %    Eosinophils 0.4 0 - 5 %    Basophils 0.1 0 - 3 %   CMP    Collection Time: 05/05/23  2:40 PM   Result Value Ref Range    Potassium 5.1 3.5 - 5.1 mEq/L    Chloride 104 98 - 107 mEq/L    Sodium 136 136 - 145 mEq/L    CO2 25 20 - 31 mEq/L    Glucose 101 74 - 106 mg/dl    BUN 10 9 - 23 mg/dl    Creatinine 1.47 (H) 0.70 - 1.30 mg/dl    GFR African American >60      GFR Non-African American 52      Calcium 8.8 8.7 - 10.4 mg/dl    Anion Gap 7 5 - 15 mmol/L    AST 46.0 (H) 0.0 - 33.9 U/L    ALT 22 10 - 49  U/L    Alkaline Phosphatase 154 (H) 46 - 116 U/L    Total Bilirubin 0.60 0.30 - 1.20 mg/dl    Total Protein 6.5 5.7 - 8.2 gm/dl    Albumin 3.1 (L) 3.4 - 5.0 gm/dl   Lipase    Collection Time: 05/05/23  2:40 PM   Result Value Ref Range    Lipase 24 12 - 53 U/L     Labs Reviewed   CBC WITH AUTO DIFFERENTIAL - Abnormal; Notable for the following components:       Result Value    MPV 11.0 (*)     RDW 51.8 (*)     Neutrophils Segmented 74.0 (*)     Lymphocytes 17.5 (*)     All other components within normal limits   COMPREHENSIVE METABOLIC PANEL - Abnormal; Notable for the following components:    Creatinine 1.40 (*)     AST 46.0 (*)     Alkaline Phosphatase 154 (*)     Albumin 3.1 (*)     All other components within normal limits   LIPASE     CT abdomen is pelvis was ordered after evaluation of patient, this was canceled by CT when nursing staff notified them that the patient eloped.    ED Course/Medical Decision Making     Vitals:    05/05/23 1329 05/05/23 1330   BP: (!) 155/93 (!) 155/83   Pulse: 81 81   Resp: 12 12   Temp: 97.9 F (36.6 C)  98.2 F (36.8 C)   TempSrc: Oral Oral   SpO2: 100% 99%   Weight: 65.8 kg (145 lb)      MDM NARRATIVE:  Patient presenting to the ED for evaluation of lower abdominal pain that has persisted throughout the last 12 hours.  I do not find his exam to be terribly concerning however given his advanced age and persistence of symptoms I do agree with his primary care doctors concerned that CT imaging would be indicated.  Could have low-grade diverticulitis, appendicitis, colitis, recurrence of kidney stone.  Discussed all of this with the patient who came in because his PCP wanted CT imaging.  He understood I was ordering the CT scan immediately following my evaluation as well as waiting on pending labs.  I offered him medication for pain and he declined.  I went to re-evaluate him and he was not there.  I was advised by the nursing staff that he asked them to remove his IV so he could  leave because he didn't want to wait any longer.  Patient eloped.  I was not made aware by nursing staff in real time to discuss with the patient why.    I did review that his laboratory studies were within normal limits at white count at 9.1, hemoglobin 13.5, hematocrit 42.2, electrolytes normal, slight elevation in AST and alk phos which are previous to have been previously elevated.  Creatinine slightly high at 1.40, I do not see that the patient has a history of chronic renal insufficiency but did have elevation a little over a year ago that normalized.  I would have ordered IV fluids after seeing patient's chemistry to treat AKI, however he eloped before this could be done.    INDEPENDENT HISTORIAN:  History and/or plan development assisted by: Patient's PCP called and spoke to Dr. Kizzie Bane by phone regarding his concerns and discussion of CT indication    EXTERNAL RESULTS REVIEWED:  I reviewed the patient's previous records here at [facility] and available outside facilities via Care Everywhere: prior labs to see patient has had AKI previously that improved/resolved     SOCIAL DETERMINANTS  impacting Evaluation and Management: patient eloped    Final Diagnosis     1. Lower abdominal pain    2. Eloped from emergency department    3. AKI (acute kidney injury)        Disposition   Eloped    The patient was discussed with Erling Conte, MD who agrees with the above assessment and plan.    Jenya Putz E. Ronnie Derby  May 05, 2023    My signature above authenticates this document and my orders, the final    diagnosis (es), discharge prescription (s), and instructions in the Epic    record.  If you have any questions please contact [phone].     Nursing notes have been reviewed by the physician/ advanced practice    Clinician.    Dragon medical dictation software was used for portions of this report. Unintended voice recognition errors may occur.                   Julien Girt, PA-C  05/05/23 [phone]

## 2023-05-05 NOTE — ED Triage Notes (Signed)
 Pt with c/o abdominal pain since last night. Pt pcp sent pt to ED to get CT scan .

## 2023-05-07 ENCOUNTER — Inpatient Hospital Stay: Admit: 2023-05-07 | Payer: MEDICARE | Primary: Family Medicine

## 2023-05-07 DIAGNOSIS — R109 Unspecified abdominal pain: Secondary | ICD-10-CM

## 2023-05-07 LAB — POCT CREATININE - BLOOD: Creatinine: 1.2 mg/dL (ref 0.6–1.3)

## 2023-05-07 MED ORDER — IOPAMIDOL 61 % IV SOLN
61 | Freq: Once | INTRAVENOUS | Status: AC | PRN
Start: 2023-05-07 — End: 2023-05-07
  Administered 2023-05-07: 20:00:00 85 mL via INTRAVENOUS

## 2023-05-07 MED FILL — ISOVUE-300 61 % IV SOLN: 61 % | INTRAVENOUS | Qty: 85 | Fill #0

## 2023-05-16 ENCOUNTER — Encounter

## 2023-05-16 ENCOUNTER — Inpatient Hospital Stay: Admit: 2023-05-16 | Payer: MEDICARE | Attending: Gastroenterology | Primary: Family Medicine

## 2023-05-16 DIAGNOSIS — C257 Malignant neoplasm of other parts of pancreas: Secondary | ICD-10-CM

## 2023-05-16 MED ORDER — GADOBUTROL 1 MMOL/ML IV SOLN
1 | Freq: Once | INTRAVENOUS | Status: AC | PRN
Start: 2023-05-16 — End: 2023-05-16
  Administered 2023-05-16: 5 mL via INTRAVENOUS

## 2023-05-16 MED FILL — GADAVIST 1 MMOL/ML IV SOLN: 1 MMOL/ML | INTRAVENOUS | Qty: 15 | Fill #0

## 2023-05-25 ENCOUNTER — Emergency Department: Admit: 2023-05-26 | Payer: MEDICARE | Primary: Family Medicine

## 2023-05-25 ENCOUNTER — Observation Stay: Admit: 2023-05-26 | Payer: MEDICARE | Primary: Family Medicine

## 2023-05-25 DIAGNOSIS — G9341 Metabolic encephalopathy: Secondary | ICD-10-CM

## 2023-05-25 DIAGNOSIS — G8929 Other chronic pain: Secondary | ICD-10-CM

## 2023-05-25 DIAGNOSIS — S72141A Displaced intertrochanteric fracture of right femur, initial encounter for closed fracture: Secondary | ICD-10-CM

## 2023-05-25 NOTE — ED Provider Notes (Signed)
 [facility]  Emergency Department       Patient: Tommy Brown Age: 78 y.o. Sex: male    Date of Birth: 06-Dec-1945 Admit Date: 05/25/2023 PCP: Cleatis Polka, MD   MRN: [id number]  CSN: [id number]     Room: ER29/ER29 Time Dictated: 11:15 PM      Chief Complaint   Chief Complaint   Patient presents with    Altered Mental Status    Fall       History of Present Illness   78 y.o. male with history of dementia, CKD, CAD s/p CABG, HTN, and pancreatic cancer with metastasis presents to the ED via EMS for altered mental status and of ground-level fall yesterday.  Per EMS the family notes that he fell yesterday and has been complaining of right hip pain.  Then today he would get out of his chair and was becoming increasingly more confused.  They measured his temperature and it was 100.6.  HPI and ROS are limited secondary to patient's mentation status    Collateral information provided by patient's wife at bedside notes that patient has been getting progressively more weak over the past week.  Yesterday he fell and hit his head.  He also started complaining of right hip pain cleared her neighbor who is a hospice nurse helped her get him into bed.  Since then he has been unable to get out of bed and complaining of pain.  He also has been becoming more increasingly confused.  Yesterday he knew who his wife was and now he cannot recognize her.  She took his temperature at home and it was 100.7.  She notes many years ago he had pancreatic cancer s/p Whipple procedure.  However recently he had a CT scan that showed a tumor on his pancreas and by the time he got the follow-up MRI he had metastasis.     Per Wife patient's oncologist is Tommy Brown with [facility]    Review of Systems   ROS   As outlined in HPI    Past Medical/Surgical History     Past Medical History:   Diagnosis Date    Arthritis     Benign prostate hyperplasia     Bladder infection     Chronic kidney disease      Coronary arteriosclerosis     Coronary atherosclerosis of artery bypass graft     Decreased testosterone level     Diverticular disease     Essential hypertension     Gout     History of acute renal failure     History of anemia     History of malignant neoplasm of pancreas     Hx of CABG     Kidney stone     Kidney stones     Neuropathy     NECK, SHOULDERS    Pancreatic cancer (HCC)     Sepsis (HCC)      Past Surgical History:   Procedure Laterality Date    CABG, ARTERY-VEIN, FOUR  1995    CAPSULE ENDOSCOPY N/A 05/23/2022    BOWEL SMALL CAPSULE ENDOSCOPY performed by Franky Macho, MD at [facility] ENDOSCOPY    CARDIAC CATHETERIZATION  2006    CERVICAL FUSION      COLONOSCOPY N/A 07/10/2020    DIAGNOSTIC COLONOSCOPY polypectomy w/hot snare, cold snare , Clip x 1  performed by Jonna Coup, MD at [facility] ENDOSCOPY    COLONOSCOPY      COLONOSCOPY  N/A 05/17/2022    COLONOSCOPY DIAGNOSTIC aborted at 25cm performed by Jonna Coup, MD at [facility] ENDOSCOPY    CORONARY ARTERY BYPASS GRAFT  1995    LUMBAR DISCECTOMY      OTHER SURGICAL HISTORY  01/01/2011    whipple procedure done for pancreatic cancer    UPPER GASTROINTESTINAL ENDOSCOPY N/A 05/17/2022    ESOPHAGOGASTRODUODENOSCOPY DIAGNOSTIC ONLY performed by Jonna Coup, MD at [facility] ENDOSCOPY    UROLOGICAL SURGERY  2012    Percutaneous extraction of a kidney stone w/ fragmentation procedure        Social History     Social History     Socioeconomic History    Marital status: Married     Spouse name: Not on file    Number of children: Not on file    Years of education: Not on file    Highest education level: Not on file   Occupational History    Not on file   Tobacco Use    Smoking status: Never     Passive exposure: Never    Smokeless tobacco: Never   Vaping Use    Vaping status: Never Used   Substance and Sexual Activity    Alcohol use: Never    Drug use: Never    Sexual activity: Defer   Other Topics Concern    Not on file   Social History Narrative    ** Merged History  Encounter **         ** Merged History Encounter **        Social Drivers of Psychologist, prison and probation services Strain: Not on file   Food Insecurity: No Food Insecurity (09/09/2022)    Hunger Vital Sign     Worried About Running Out of Food in the Last Year: Never true     Ran Out of Food in the Last Year: Never true   Transportation Needs: No Transportation Needs (09/09/2022)    PRAPARE - Therapist, art (Medical): No     Lack of Transportation (Non-Medical): No   Physical Activity: Not on file   Stress: Not on file   Social Connections: Not on file   Intimate Partner Violence: Not on file   Housing Stability: Low Risk  (09/09/2022)    Housing Stability Vital Sign     Unable to Pay for Housing in the Last Year: No     Number of Places Lived in the Last Year: 1     Unstable Housing in the Last Year: No       Family History     Family History   Problem Relation Age of Onset    Emphysema Brother     Alcohol Abuse Father     Heart Disease Brother        Current Medications     Current Facility-Administered Medications   Medication Dose Route Frequency Provider Last Rate Last Admin    HYDROmorphone (DILAUDID) injection 0.25 mg  0.25 mg IntraVENous NOW Kiarrah Rausch L, DO        ondansetron (ZOFRAN) injection 4 mg  4 mg IntraVENous Once Alyus Mofield L, DO         Current Outpatient Medications   Medication Sig Dispense Refill    amLODIPine (NORVASC) 2.5 MG tablet Take 1 tablet by mouth daily 30 tablet 2    metoprolol tartrate (LOPRESSOR) 25 MG tablet Take 1 tablet by mouth 2 times daily 60 tablet 3  pravastatin (PRAVACHOL) 10 MG tablet Take 1 tablet by mouth nightly 30 tablet 3    bimatoprost (LUMIGAN) 0.01 % SOLN ophthalmic drops Place 1 drop into the right eye nightly      timolol (BETIMOL) 0.5 % ophthalmic solution Place 1 drop into the left eye at bedtime      ferrous sulfate (IRON 325) 325 (65 Fe) MG tablet Take 1 tablet by mouth daily (with breakfast)      lipase-protease-amylase (CREON)  24000-76000 units delayed release capsule Take 1 capsule by mouth 3 times daily (with meals) And with snacks      allopurinol (ZYLOPRIM) 300 MG tablet Take 1 tablet by mouth daily      tamsulosin (FLOMAX) 0.4 MG capsule Take 1 capsule by mouth in the morning and at bedtime      gabapentin (NEURONTIN) 600 MG tablet Take 1 tablet by mouth 3 times daily. Max Daily Amount: 1,800 mg      omeprazole (PRILOSEC) 20 MG delayed release capsule Take 2 capsules by mouth daily For gerds      aspirin 81 MG EC tablet Take 1 tablet by mouth daily      Multiple Vitamins-Minerals (THERAPEUTIC MULTIVITAMIN-MINERALS) tablet Take 1 tablet by mouth daily Centrum Silver for men      magnesium oxide (MAG-OX) 400 (240 Mg) MG tablet Take 1 tablet by mouth daily      testosterone cypionate (DEPOTESTOTERONE CYPIONATE) 200 MG/ML injection INJECT 0.5 MILLILITER BY INTRAMUSCULAR ROUTE EVERY 2 WEEKS      Omega-3 Fatty Acids-Hemp Extra (HEMP MONOPURE PO) Take by mouth      aspirin 81 MG chewable tablet Take 1 tablet by mouth daily RESUME THIS AFTER ONE WEEK, ONLY IF NO MORE BLEEDING IN BOWEL MOVEMENTS 30 tablet 3    omeprazole (PRILOSEC) 40 MG delayed release capsule Take 1 capsule by mouth daily      acetaminophen (TYLENOL) 325 MG tablet Take 2 tablets by mouth every 6 hours as needed      allopurinol (ZYLOPRIM) 300 MG tablet Take 1 tablet by mouth daily      gabapentin (NEURONTIN) 600 MG tablet Take 2 tablets by mouth 4 times daily.      tamsulosin (FLOMAX) 0.4 MG capsule Take 2 capsules by mouth daily      temazepam (RESTORIL) 15 MG capsule Take 1 capsule by mouth nightly as needed for Anxiety or Sleep.         Allergies     Allergies   Allergen Reactions    Codeine Itching and Swelling    Oxycodone Anaphylaxis    Amoxicillin Diarrhea    Hydroxyzine Pamoate Itching and Other (See Comments)     Other reaction(s): Unknown    Meperidine Itching, Other (See Comments), Palpitations and Swelling     Other reaction(s): Unknown    Metoclopramide  Itching, Other (See Comments) and Swelling     Other reaction(s): Other (See Comments)  Reaction unknown   Reaction unknown     Morphine Nausea And Vomiting and Other (See Comments)    Oxycodone-Acetaminophen Itching    Prochlorperazine Itching and Other (See Comments)     Other reaction(s): Unknown (comments)  Other reaction(s): Unknown  Unsure of reaction    Amoxicillin Diarrhea    Codeine Itching and Swelling    Hydroxyzine Hcl Hives    Hydroxyzine Pamoate Itching    Metoclopramide Itching and Swelling    Other Other (See Comments)     VISTAID  ANESTHESIA    Other  VISTAID ANESTHESIA    Oxycodone-Acetaminophen Itching    Prochlorperazine Itching    Meperidine Itching, Swelling and Palpitations    Morphine Nausea And Vomiting       Physical Exam     ED Triage Vitals [05/25/23 2017]   BP Systolic BP Percentile Diastolic BP Percentile Temp Temp Source Pulse Respirations SpO2   (!) 160/99 -- -- 98.9 F (37.2 C) Oral (!) 115 14 94 %      Height Weight - Scale         1.778 m (5\' 10" ) 65.8 kg (145 lb)            Physical Exam  Vitals and nursing note reviewed.   Constitutional:       Comments: Patient opens eyes to verbal stimuli.  Cachectic appearing   HENT:      Head: Normocephalic and atraumatic.      Mouth/Throat:      Comments: Dry mucous membranes  Eyes:      General: No scleral icterus.     Extraocular Movements: Extraocular movements intact.      Pupils: Pupils are equal, round, and reactive to light.   Neck:      Comments: Presents in a c-collar.  No C-spine midline tenderness to palpation  Cardiovascular:      Rate and Rhythm: Tachycardia present.      Heart sounds: Murmur heard.      Comments: 2+ radial and DP pulses bilaterally.  No peripheral edema.  Pulmonary:      Effort: Pulmonary effort is normal.      Breath sounds: No wheezing or rhonchi.   Chest:      Chest wall: No tenderness.   Abdominal:      General: There is no distension.      Palpations: Abdomen is soft.      Tenderness: There is no  abdominal tenderness.   Musculoskeletal:      Comments: Tenderness to palpation of the right hip.  Ecchymosis noted over the left thigh.  No obvious deformities.  Limited range of motion secondary to pain   Skin:     General: Skin is warm and dry.      Capillary Refill: Capillary refill takes less than 2 seconds.   Neurological:      Comments: Opens eyes to verbal stimuli.  Oriented to self. Follows simple commands with multiple prompting.  Difficult to assess neurological status           Impression and Management Plan   78 y.o. male with history of dementia, CKD, CAD s/p CABG, HTN, and pancreatic cancer with metastasis presents to the ED via EMS for altered mental status and of ground-level fall yesterday.     Differential diagnoses to include but not limited to metabolic encephalopathy due to infectious etiology (URI versus pneumonia versus UTI) versus metastasis to the brain versus intracranial hemorrhage     Diagnostic Studies   Lab:   Results for orders placed or performed during the hospital encounter of 05/25/23   Culture, Blood 2    Specimen: Blood    Narrative    Source:->Blood   COVID-19, Flu A/B, and RSV Combo    Specimen: Nasopharyngeal Swab   Result Value Ref Range    SARS-CoV-2 NEGATIVE NEGATIVE      Influenza A PCR NEGATIVE NEGATIVE      Influenza B by PCR NEGATIVE NEGATIVE      RSV A/B PCR NEGATIVE NEGATIVE      Narrative  Pregnant:->No   CT HEAD WO CONTRAST    Narrative    Examination: CT brain noncontrast    Clinical indication: Sepsis, fall.    Comparison: None    Findings: Multiple contiguous axial CT images of the brain were obtained from  the vertex of the skull to the skull base with no contrast. All CT exams at this  facility use one or more dose reduction techniques including automatic exposure  control, ma/kV  adjustment per patient's size, or iterative reconstruction  technique.     Paranasal sinuses well aerated.  Mastoids, normal.    No hemorrhage, masses, or midline shift.     No  extra-axial fluid collections.    No hydrocephalus.      Periventricular areas of low attenuation.   Moderate frontotemporal atrophy.   Subinsular low densities.    Osseous structures intact. Skullbase and orbits grossly intact.  Craniocervical junction is intact.      Impression    Impression:    Chronic microvascular ischemic changes.     Electronically signed by: Earline Mayotte, MD 05/25/2023 10:42 PM EDT            Workstation ID: [id number]     CT CERVICAL SPINE WO CONTRAST    Narrative    Examination:  CT CERVICAL SPINE WO CONTRAST    INDICATION: Trauma, fall;    COMPARISON: None.    FINDINGS:    CT head: No intracranial hemorrhage. No hydrocephalus. No intracranial mass  lesion.  Posterior fossa including the brainstem and cerebellum appear unremarkable.  There are age-related chronic microvascular ischemic changes in the brain  parenchyma.    No calvarial fractures. No significant abnormality in the paranasal sinuses,  orbits, middle ear cavities or mastoid air cells.      CT cervical spine:  Vertebral body heights are maintained. Craniocervical junction is unremarkable.  No acute fracture or dislocation. ACDF changes noted at C3-4. Bony fusion noted  from C3 to C6. Degenerative changes at C6-7.    Mild-to-moderate multilevel degenerative changes. Prevertebral soft tissues are  normal.        Impression    IMPRESSION:  No acute intracranial abnormality.  No acute fractures or dislocation in the cervical spine.    Electronically signed by: Consuello Masse, MD 05/25/2023 10:37 PM EDT            Workstation ID: [id number]     XR FEMUR LEFT (MIN 2 VIEWS)    Narrative    EXAMINATION: XR FEMUR LEFT (MIN 2 VIEWS)    CLINICAL INDICATION: Injury/Pain left leg pain    COMPARISON:  None.    TECHNIQUE: 2 views left femur    FINDINGS:   Mild osteopenia of femur. Extensive vascular calcifications. No fracture. No  periostitis. Moderate patellofemoral spurring.      Impression    IMPRESSION:   No fracture.      Electronically signed by: Earline Mayotte, MD 05/25/2023 10:10 PM EDT            Workstation ID: [id number]     XR PELVIS (1-2 VIEWS)    Narrative    EXAMINATION: XR PELVIS (1-2 VIEWS)    CLINICAL INDICATION: Injury/Pain    COMPARISON:  None.    TECHNIQUE: AP pelvis    FINDINGS:   Femoral heads appear intact. No femoral head avascular necrosis. No fracture.  Vascular calcifications. Penile prosthesis. Postop changes lumbar sacral spine.  Mild joint space narrowing bilaterally.      Impression  IMPRESSION:   No fracture.     Electronically signed by: Earline Mayotte, MD 05/25/2023 10:11 PM EDT            Workstation ID: [id number]     XR CHEST PORTABLE    Narrative    Exam: AP portable chest    Clinical indication: sepsis    Comparison: 09/25/2020;      Results:  No consolidation.  No pleural effusions.      Sternotomy wires. Postop changes cervical spine.  No pneumothorax.    Heart normal.  Mediastinal contours are normal.     No free air is seen under the hemidiaphragms.   Osseous structures intact.      Impression    IMPRESSION:  No acute disease.    Electronically signed by: Earline Mayotte, MD 05/25/2023 10:12 PM EDT            Workstation ID: [id number]     Comprehensive Metabolic Panel   Result Value Ref Range    Potassium 5.1 3.5 - 5.1 mEq/L    Chloride 96 (L) 98 - 107 mEq/L    Sodium 131 (L) 136 - 145 mEq/L    CO2 24 20 - 31 mEq/L    Glucose 128 (H) 74 - 106 mg/dl    BUN 13 9 - 23 mg/dl    Creatinine 2.84 1.32 - 1.30 mg/dl    GFR African American >60      GFR Non-African American >60      Calcium 9.0 8.7 - 10.4 mg/dl    Anion Gap 11 5 - 15 mmol/L    AST 27.0 0.0 - 33.9 U/L    ALT 15 10 - 49 U/L    Alkaline Phosphatase 148 (H) 46 - 116 U/L    Total Bilirubin 0.80 0.30 - 1.20 mg/dl    Total Protein 6.8 5.7 - 8.2 gm/dl    Albumin 2.6 (L) 3.4 - 5.0 gm/dl   CBC with Auto Differential   Result Value Ref Range    WBC 14.6 (H) 4.0 - 11.0 1000/mm3    RBC 4.33 3.80 - 5.70 M/uL    Hemoglobin 12.9 (L) 13.0  - 18.0 gm/dl    Hematocrit 44.0 10.2 - 55.0 %    MCV 93.3 80.0 - 98.0 fL    MCH 29.8 23.0 - 34.6 pg    MCHC 31.9 30.0 - 36.0 gm/dl    Platelets 725 366 - 450 1000/mm3    MPV 10.4 (H) 6.0 - 10.0 fL    RDW 49.0 (H) 35.1 - 43.9      Nucleated RBCs 0 0 - 0      Immature Granulocytes % 1.0 0.0 - 3.0 %    Neutrophils Segmented 81.6 (H) 34 - 64 %    Lymphocytes 9.2 (L) 28 - 48 %    Monocytes 8.0 1 - 13 %    Eosinophils 0.1 0 - 5 %    Basophils 0.1 0 - 3 %   Lactate, Sepsis   Result Value Ref Range    Lactate 1.4 0.5 - 2.2 mmol/L   Procalcitonin   Result Value Ref Range    Procalcitonin 0.58 (H) 0.00 - 0.50 ng/ml   Troponin   Result Value Ref Range    Troponin, High Sensitivity 22 0 - 53 ng/L   Urinalysis   Result Value Ref Range    Color, UA Yellow Yellow,Straw      Clarity, UA Slightly Cloudy (A) Clear  Glucose, Ur Negative Negative mg/dl    Bilirubin, Urine Negative Negative      Ketones, Urine 15 mg/dL (A) Negative mg/dl    Specific Gravity, UA 1.020 1.005 - 1.030      Blood, Urine Negative Negative      pH, Urine 5.5 5.0 - 9.0      Protein, Urine Negative Negative mg/dl    Urobilinogen, Urine 0.2 E.U./dL 0.0 - 1.0 mg/dl    Nitrite, Urine Negative Negative      Leukocyte Esterase, Urine Negative Negative     Magnesium   Result Value Ref Range    Magnesium 1.7 1.6 - 2.6 mg/dL   Protime-INR   Result Value Ref Range    Protime 15.0 (H) 10.2 - 12.9 seconds    INR 1.3 (H) 0.1 - 1.1          Medications   HYDROmorphone (DILAUDID) injection 0.25 mg (has no administration in time range)   ondansetron (ZOFRAN) injection 4 mg (has no administration in time range)   sodium chloride 0.9 % bolus 1,000 mL (0 mLs IntraVENous Stopped 05/25/23 2151)   acetaminophen (OFIRMEV) infusion 1,000 mg (0 mg IntraVENous Stopped 05/25/23 2159)       My interpretation of imaging and laboratory studies as below.     EKG as interpreted by me sinus tachycardia at 103 bpm.  PACs noted.  Left axis deviation.  Normal intervals.  PR 164, QRS 74, QTc  442.  Nonspecific ST-T wave changes.  No significant change from prior EKG on 09/09/2022 except now sinus tachycardic. Overall normal sinus rhythm no signs of acute ischemia or arrhythmia.    Bedside telemetry monitoring ordered due to closer monitoring. My interpretation of the bedside cardiac monitor is sinus tachycardia at 112 bpm.    Medical Decision Making/ED Course     ED Course as of 05/25/23 2315   Sun May 25, 2023   2100 WBC(!): 14.6  CBC notable for leukocytosis with a WBC of 14.6 with a left shift.  Otherwise no actionable anemia. Hemoglobin of 12.9 appears to be around patient's baseline compared to in the last 8 months it has ranged from 13.5-13.8 [KB]   2110 CMP shows normally low sodium.  No other actionable electrolyte abnormalities.  No signs of kidney dysfunction.  Mildly elevated alk phos of 148.  Otherwise no significant LFT elevations.  Magnesium within normal limits [KB]   2110 Sodium(!): 131 [KB]   2111 Lactate acid not significantly elevated [KB]   2111 Troponin initially 22, Compared to the last 8 months it has ranged from 14-16 [KB]   2123 Procalcitonin(!): 0.58 [KB]   2135 COVID, Flu, RSV negative [KB]   2145 Chest x-ray per my interpretation shows no focal consolidation to suggest pneumonia.  No pleural effusions.  No bony abnormalities. [KB]   2146 Left femur x-ray shows no acute fracture or dislocation [KB]   2203 Ketones, Urine(!): 15 mg/dL  Otherwise UA shows no signs of infection [KB]   2236 Chest x-ray radiology impression reviewed by me shows no acute intrathoracic abnormalities [KB]   2236 Radiology impression of the pelvis and left femur x-rays reviewed by me shows no fracture or dislocation [KB]   2240  CT head and CT C-spine images reviewed by me and radiology impression shows no acute intracranial abnormalities.  No acute fractures or dislocations of the cervical spine shows no acute intrathoracic abnormalities [KB]   2247 On reevaluation patient's mentation has improved.  He  is alert  and oriented to himself.  He recognizes his wife.  However, he still cannot get out of bed. He currently denies any pain.      Reviewed CT c-spine imaging. C-spine collar cleared and removed at this time. Patient has no C-spine midline tenderness to palpation. FROM with no parathesias or weakness.     Discussed laboratory workup and radiology findings with the patient and his wife.  Answered all questions.  He remains hemodynamically stable at this time.  Heart rate has improved to 87 bpm.  They are agreeable with the plan for admission. [KB]   2256 Spoke with hospitalist who agrees to place patient in observation for further evaluation and management.  [KB]   2314 Patient continues to have pain to right hip. Will obtain xray that extends to right femur.  [KB]      ED Course User Index  [KB] Emberly Tomasso L, DO       Procedures      EXTERNAL RECORDS & PREVIOUS RESULTS REVIEWED:  I reviewed the patient's previous records here at [facility] and available outside facilities and note that     Most recent echo on 05/20/2022 shows LVEF of 49% with mild concentric left ventricular hypertrophy.  Moderate aortic stenosis.    Patient recently had a CT on 05/07/2023 that showed a mass in the body of the pancreas which was not previously identified on 05/18/2022 and consistent with a recurrent tumor.    MRI on 05/16/2023 shows multiple new ring-enhancing hepatic lesions worrisome for metastatic disease.  Pancreatic mass and status post Whipple procedure    Per Wife patient's oncologist is Tommy Brown with [facility]    INDEPENDENT HISTORIAN:  History and/or plan development assisted by: Family Member, EMS    Severe exacerbation or progression of chronic illness: Severe progression  of dementia, pancreatic cancer    CONDITION POSING Threat to body FUNCTION, LIFE, OR LIMB without evaluation and management:  Injury to/Impaired function of the CNS, Cardiovascular system, Respiratory system, GI system, Musculoskeletal  system    SOCIAL DETERMINANTS  impacting the Medical Decision Making: stress,    health literacy  which makes Follow-up problematic or uncertain.    Comorbidities impacting Evaluation and Management: CKD, CAD, HTN    Critical Care Time (if necessary) none      NARRATIVE:     78 y.o. male with history of dementia, CKD, CAD s/p CABG, HTN, and pancreatic cancer with metastasis presents to the ED via EMS for altered mental status and of ground-level fall yesterday.     Initial vital signs notable for elevated blood pressure of 160/99.  Tachycardic of 115 bpm.  Respirations within normal limits.  Maintaining O2 saturation on room air.  Afebrile.  On physical exam patient opens eyes to verbal stimuli.  He is oriented to self.  He has tenderness to palpation of the right hip.  There is ecchymosis noted to his left femur.  Cachectic appearing with no significant tenderness to palpation of the abdomen.  No anterior chest wall tenderness to palpation.  He presented in a c-collar.    I considered the following testing, treatment, or disposition:   CT imaging  but decided not to pursue due to  patient with no recent cough and abdomen no focal tenderness to palpation. With recent CT imaging on 05/07/2023 and MRI on 05/16/2023 do not recommend any repeat imaging at this time.     Final Diagnosis       ICD-10-CM  1. Acute metabolic encephalopathy  G93.41       2. Primary pancreatic cancer with metastasis to other site (HCC)  C25.9       3. Ground-level fall  W18.30XA       4. Dehydration  E86.0             Disposition   Admission    Daron Offer, DO   Emergency Medicine PGY-3  [facility]  May 25, 2023  11:15 PM     Patient seen with Attending, Dr. Arvella Merles    Portions of this electronic record were dictated using Dragon voice recognition software.  Unintended errors in translation may occur.                     Daron Offer, DO  Resident  05/25/23 2300       Daron Offer, DO  Resident  05/25/23 [phone]

## 2023-05-25 NOTE — ED Triage Notes (Signed)
 Pt brought in to ER from home via EMS. Pt family reports increasing weakness x 1 week. Pt had a fall yesterday evening resulting in R hip pain. No LOC with fall, pt is not on blood thinners. Family reported that today pt has been having increasing confusion.    Pt with recent dx of pancreatic CA with mets.

## 2023-05-25 NOTE — ED Notes (Signed)
 Medications finished infusing.   Repeat labs drawn and patient connected to monitor.     Call bell at bedside with visitor.      Lemar Lofty, RN  05/25/23 2244

## 2023-05-25 NOTE — Progress Notes (Signed)
 TRANSFER - OUT REPORT:    Verbal report given to Karena Addison, RN on Tommy Brown  being transferred to [id number] routine progression of patient care       Report consisted of patient's Situation, Background, Assessment and   Recommendations(SBAR).     Information from the following report(s) Nurse Handoff Report, Index, ED Encounter Summary, ED SBAR, MAR, and Recent Results was reviewed with the receiving nurse.    Kinder Fall Assessment:                           Lines:   Peripheral IV 05/25/23 Right;Ventral Wrist (Active)       Peripheral IV 05/25/23 Left;Proximal;Ventral Forearm (Active)        Opportunity for questions and clarification was provided.      Patient transported with:  Monitor and Registered Nurse

## 2023-05-25 NOTE — Progress Notes (Signed)
 Pt taken to CT by transport.

## 2023-05-25 NOTE — ED Notes (Signed)
 MD removed c collar     Rachael Fee, RN  05/25/23 2249

## 2023-05-25 NOTE — H&P (Addendum)
 Medicine History and Physical    Patient: Tommy Brown   Age:  78 y.o.    Assessment   Altered mental status  Leukocytosis   Fall  R hip pain   Liver lesions concerning for metastasis  Pancreatic mass concerning for recurrent cancer  History of pancreatic cancer status post Whipple surgery  Coronary artery disease     Plan     Neurochecks .   Monitor wbc. Monitor for fever, sign of infection, if sign of infection start abx  Blood cs pending  R femur Xray ordered by ER pending  Patient wife requesting palliative care consult  Addendum.R femur Xray result reviewed. Discussed with wife and patient. Ortho consult with Dr Renaldo Harrison  Dvt ppx - ac when ok with ortho        Further recommendations based on clinical course. Discussed with patient and wife current assessment and plan and answered all question. Thank you very much for allowing me to participate in this very pleasant patient's care.     DISPO  -Pt to be admitted  at this time for reasons addressed above, continued hospitalization for ongoing assessment and treatment indicated     Anticipated Date of Discharge: pending  Anticipated Disposition (home, SNF) : pending    HPI:   Tommy Brown is a 78 y.o. year old male who presents with  fall.    Patient is a very pleasant 78 year old male with history of pancreatic cancer, status post Whipple surgery, coronary artery disease, comes in after a fall.  Patient wife reports that patient had Whipple surgery for pancreatic cancer many years ago, last seen by oncology in 2021, Dr. Jodie Echevaria.  Recently patient complaining of abdominal pain, was seen by family doctor, and outpatient CT scan done, concerning for pancreatic lesion.  He had an outpatient MRI done recently, concerning for liver metastases according to the wife.  They plan to see Dr. Jodie Echevaria on Tuesday.  Yesterday patient had a fall when he was going to the bathroom, no loss of consciousness.  Patient has been complaining of right hip pain.  Today, patient acted  confused, had temperature 100.7, wife reports this is not unusual for him to have low-grade fever temperature.  No vomiting or chest pain or shortness of breath.    Review of Systems -12 point review system done pertinent positive and negative as stated in HPI      Past Medical History:  Past Medical History:   Diagnosis Date    Arthritis     Benign prostate hyperplasia     Bladder infection     Chronic kidney disease     Coronary arteriosclerosis     Coronary atherosclerosis of artery bypass graft     Decreased testosterone level     Diverticular disease     Essential hypertension     Gout     History of acute renal failure     History of anemia     History of malignant neoplasm of pancreas     Hx of CABG     Kidney stone     Kidney stones     Neuropathy     NECK, SHOULDERS    Pancreatic cancer (HCC)     Sepsis (HCC)        Past Surgical History:  Past Surgical History:   Procedure Laterality Date    CABG, ARTERY-VEIN, FOUR  1995    CAPSULE ENDOSCOPY N/A 05/23/2022    BOWEL SMALL CAPSULE ENDOSCOPY performed  by Franky Macho, MD at [facility] ENDOSCOPY    CARDIAC CATHETERIZATION  2006    CERVICAL FUSION      COLONOSCOPY N/A 07/10/2020    DIAGNOSTIC COLONOSCOPY polypectomy w/hot snare, cold snare , Clip x 1  performed by Jonna Coup, MD at [facility] ENDOSCOPY    COLONOSCOPY      COLONOSCOPY N/A 05/17/2022    COLONOSCOPY DIAGNOSTIC aborted at 25cm performed by Jonna Coup, MD at [facility] ENDOSCOPY    CORONARY ARTERY BYPASS GRAFT  1995    LUMBAR DISCECTOMY      OTHER SURGICAL HISTORY  01/01/2011    whipple procedure done for pancreatic cancer    UPPER GASTROINTESTINAL ENDOSCOPY N/A 05/17/2022    ESOPHAGOGASTRODUODENOSCOPY DIAGNOSTIC ONLY performed by Jonna Coup, MD at [facility] ENDOSCOPY    UROLOGICAL SURGERY  2012    Percutaneous extraction of a kidney stone w/ fragmentation procedure        Family History:  Family History   Problem Relation Age of Onset    Emphysema Brother     Alcohol Abuse Father     Heart Disease Brother         Social History:  Social History     Socioeconomic History    Marital status: Married   Tobacco Use    Smoking status: Never     Passive exposure: Never    Smokeless tobacco: Never   Vaping Use    Vaping status: Never Used   Substance and Sexual Activity    Alcohol use: Never    Drug use: Never    Sexual activity: Defer   Social History Narrative    ** Merged History Encounter **         ** Merged History Encounter **        Social Drivers of Health     Food Insecurity: No Food Insecurity (09/09/2022)    Hunger Vital Sign     Worried About Running Out of Food in the Last Year: Never true     Ran Out of Food in the Last Year: Never true   Transportation Needs: No Transportation Needs (09/09/2022)    PRAPARE - Therapist, art (Medical): No     Lack of Transportation (Non-Medical): No   Housing Stability: Low Risk  (09/09/2022)    Housing Stability Vital Sign     Unable to Pay for Housing in the Last Year: No     Number of Places Lived in the Last Year: 1     Unstable Housing in the Last Year: No       Home Medications:    Reviewed with wife        Allergies:  Allergies   Allergen Reactions    Codeine Itching and Swelling    Oxycodone Anaphylaxis    Amoxicillin Diarrhea    Hydroxyzine Pamoate Itching and Other (See Comments)     Other reaction(s): Unknown    Meperidine Itching, Other (See Comments), Palpitations and Swelling     Other reaction(s): Unknown    Metoclopramide Itching, Other (See Comments) and Swelling     Other reaction(s): Other (See Comments)  Reaction unknown   Reaction unknown     Morphine Nausea And Vomiting and Other (See Comments)    Oxycodone-Acetaminophen Itching    Prochlorperazine Itching and Other (See Comments)     Other reaction(s): Unknown (comments)  Other reaction(s): Unknown  Unsure of reaction    Amoxicillin Diarrhea  Codeine Itching and Swelling    Hydroxyzine Hcl Hives    Hydroxyzine Pamoate Itching    Metoclopramide Itching and Swelling    Other Other  (See Comments)     VISTAID  ANESTHESIA    Other      VISTAID ANESTHESIA    Oxycodone-Acetaminophen Itching    Prochlorperazine Itching    Meperidine Itching, Swelling and Palpitations    Morphine Nausea And Vomiting           Physical Exam:   Visit Vitals  BP (!) 144/80   Pulse 86   Temp 98.9 F (37.2 C) (Oral)   Resp 16   Ht 1.778 m (5\' 10" )   Wt 65.8 kg (145 lb)   SpO2 92%   BMI 20.81 kg/m       Physical Exam:   General appearance: alert, cooperative   Head: Normocephalic   Neck: supple, trachea midline  Lungs: clear to auscultation bilaterally  Heart: regular rate and rhythm   Abdomen: soft, non-tender. Bowel sounds normal.    Extremities: extremities no  edema  Skin: Skin dry  Neurologic: aao x 3 follow commands move all 4 ext  PSY: mood and affect normal, appropriately behaved     Intake and Output:  Current Shift:  03/30 1901 - 03/31 0700  In: 100   Out: -   Last three shifts:  No intake/output data recorded.    Lab/Data Reviewed:  Recent Results (from the past 24 hours)   Comprehensive Metabolic Panel    Collection Time: 05/25/23  8:26 PM   Result Value Ref Range    Potassium 5.1 3.5 - 5.1 mEq/L    Chloride 96 (L) 98 - 107 mEq/L    Sodium 131 (L) 136 - 145 mEq/L    CO2 24 20 - 31 mEq/L    Glucose 128 (H) 74 - 106 mg/dl    BUN 13 9 - 23 mg/dl    Creatinine 2.95 2.84 - 1.30 mg/dl    GFR African American >60      GFR Non-African American >60      Calcium 9.0 8.7 - 10.4 mg/dl    Anion Gap 11 5 - 15 mmol/L    AST 27.0 0.0 - 33.9 U/L    ALT 15 10 - 49 U/L    Alkaline Phosphatase 148 (H) 46 - 116 U/L    Total Bilirubin 0.80 0.30 - 1.20 mg/dl    Total Protein 6.8 5.7 - 8.2 gm/dl    Albumin 2.6 (L) 3.4 - 5.0 gm/dl   CBC with Auto Differential    Collection Time: 05/25/23  8:26 PM   Result Value Ref Range    WBC 14.6 (H) 4.0 - 11.0 1000/mm3    RBC 4.33 3.80 - 5.70 M/uL    Hemoglobin 12.9 (L) 13.0 - 18.0 gm/dl    Hematocrit 13.2 44.0 - 55.0 %    MCV 93.3 80.0 - 98.0 fL    MCH 29.8 23.0 - 34.6 pg    MCHC 31.9 30.0 -  36.0 gm/dl    Platelets 102 725 - 450 1000/mm3    MPV 10.4 (H) 6.0 - 10.0 fL    RDW 49.0 (H) 35.1 - 43.9      Nucleated RBCs 0 0 - 0      Immature Granulocytes % 1.0 0.0 - 3.0 %    Neutrophils Segmented 81.6 (H) 34 - 64 %    Lymphocytes 9.2 (L) 28 - 48 %    Monocytes  8.0 1 - 13 %    Eosinophils 0.1 0 - 5 %    Basophils 0.1 0 - 3 %   Lactate, Sepsis    Collection Time: 05/25/23  8:26 PM   Result Value Ref Range    Lactate 1.4 0.5 - 2.2 mmol/L   Procalcitonin    Collection Time: 05/25/23  8:26 PM   Result Value Ref Range    Procalcitonin 0.58 (H) 0.00 - 0.50 ng/ml   Troponin    Collection Time: 05/25/23  8:26 PM   Result Value Ref Range    Troponin, High Sensitivity 22 0 - 53 ng/L   Magnesium    Collection Time: 05/25/23  8:26 PM   Result Value Ref Range    Magnesium 1.7 1.6 - 2.6 mg/dL   Protime-INR    Collection Time: 05/25/23  8:26 PM   Result Value Ref Range    Protime 15.0 (H) 10.2 - 12.9 seconds    INR 1.3 (H) 0.1 - 1.1     COVID-19, Flu A/B, and RSV Combo    Collection Time: 05/25/23  8:47 PM    Specimen: Nasopharyngeal Swab   Result Value Ref Range    SARS-CoV-2 NEGATIVE NEGATIVE      Influenza A PCR NEGATIVE NEGATIVE      Influenza B by PCR NEGATIVE NEGATIVE      RSV A/B PCR NEGATIVE NEGATIVE     Urinalysis    Collection Time: 05/25/23  9:00 PM   Result Value Ref Range    Color, UA Yellow Yellow,Straw      Clarity, UA Slightly Cloudy (A) Clear      Glucose, Ur Negative Negative mg/dl    Bilirubin, Urine Negative Negative      Ketones, Urine 15 mg/dL (A) Negative mg/dl    Specific Gravity, UA 1.020 1.005 - 1.030      Blood, Urine Negative Negative      pH, Urine 5.5 5.0 - 9.0      Protein, Urine Negative Negative mg/dl    Urobilinogen, Urine 0.2 E.U./dL 0.0 - 1.0 mg/dl    Nitrite, Urine Negative Negative      Leukocyte Esterase, Urine Negative Negative     Lactate, Sepsis    Collection Time: 05/25/23 10:40 PM   Result Value Ref Range    Lactate 0.7 0.5 - 2.2 mmol/L   Troponin    Collection Time: 05/25/23 10:40  PM   Result Value Ref Range    Troponin, High Sensitivity 25 0 - 53 ng/L            Meryle Ready, MD  May 25, 2023    [facility] medical dictation software was used for portions of this report.  Unintended voice transcription errors may have occurred.

## 2023-05-25 NOTE — Progress Notes (Signed)
Dr Wenda Low at bedside.

## 2023-05-25 NOTE — Progress Notes (Signed)
 X-ray at bedside

## 2023-05-26 ENCOUNTER — Inpatient Hospital Stay: Admit: 2023-05-26 | Payer: MEDICARE | Primary: Family Medicine

## 2023-05-26 ENCOUNTER — Inpatient Hospital Stay
Admission: EM | Admit: 2023-05-26 | Discharge: 2023-05-30 | Disposition: A | Discharge status: Skilled Nursing Facility | Payer: MEDICARE | Attending: Internal Medicine | Admitting: Internal Medicine

## 2023-05-26 LAB — COMPREHENSIVE METABOLIC PANEL
ALT: 15 U/L (ref 10–49)
AST: 27 U/L (ref 0.0–33.9)
Albumin: 2.6 g/dL — ABNORMAL LOW (ref 3.4–5.0)
Alkaline Phosphatase: 148 U/L — ABNORMAL HIGH (ref 46–116)
Anion Gap: 11 mmol/L (ref 5–15)
BUN: 13 mg/dL (ref 9–23)
CO2: 24 meq/L (ref 20–31)
Calcium: 9 mg/dL (ref 8.7–10.4)
Chloride: 96 meq/L — ABNORMAL LOW (ref 98–107)
Creatinine: 1.1 mg/dL (ref 0.70–1.30)
GFR African American: >60
GFR Non-African American: >60
Glucose: 128 mg/dL — ABNORMAL HIGH (ref 74–106)
Potassium: 5.1 meq/L (ref 3.5–5.1)
Sodium: 131 meq/L — ABNORMAL LOW (ref 136–145)
Total Bilirubin: 0.8 mg/dL (ref 0.30–1.20)
Total Protein: 6.8 g/dL (ref 5.7–8.2)

## 2023-05-26 LAB — CBC WITH AUTO DIFFERENTIAL
Basophils: 0.1 % (ref 0–3)
Basophils: 0.1 % (ref 0–3)
Eosinophils: 0.1 % (ref 0–5)
Eosinophils: 0 % (ref 0–5)
Hematocrit: 40.4 % (ref 36.0–55.0)
Hematocrit: 34.3 % — ABNORMAL LOW (ref 36.0–55.0)
Hemoglobin: 12.9 g/dL — ABNORMAL LOW (ref 13.0–18.0)
Hemoglobin: 10.9 g/dL — ABNORMAL LOW (ref 13.0–18.0)
Immature Granulocytes %: 1 % (ref 0.0–3.0)
Immature Granulocytes %: 1 % (ref 0.0–3.0)
Lymphocytes: 9.2 % — ABNORMAL LOW (ref 28–48)
Lymphocytes: 8.1 % — ABNORMAL LOW (ref 28–48)
MCH: 29.8 pg (ref 23.0–34.6)
MCH: 29.9 pg (ref 23.0–34.6)
MCHC: 31.9 g/dL (ref 30.0–36.0)
MCHC: 31.8 g/dL (ref 30.0–36.0)
MCV: 93.3 fL (ref 80.0–98.0)
MCV: 94.2 fL (ref 80.0–98.0)
MPV: 10.4 fL — ABNORMAL HIGH (ref 6.0–10.0)
MPV: 10.8 fL — ABNORMAL HIGH (ref 6.0–10.0)
Monocytes: 8 % (ref 1–13)
Monocytes: 8.1 % (ref 1–13)
Neutrophils Segmented: 81.6 % — ABNORMAL HIGH (ref 34–64)
Neutrophils Segmented: 82.7 % — ABNORMAL HIGH (ref 34–64)
Nucleated RBCs: 0 (ref 0–0)
Nucleated RBCs: 0 (ref 0–0)
Platelets: 285 10*3/uL (ref 140–450)
Platelets: 245 10*3/uL (ref 140–450)
RBC: 4.33 M/uL (ref 3.80–5.70)
RBC: 3.64 M/uL — ABNORMAL LOW (ref 3.80–5.70)
RDW: 49 — ABNORMAL HIGH (ref 35.1–43.9)
RDW: 49 — ABNORMAL HIGH (ref 35.1–43.9)
WBC: 14.6 10*3/uL — ABNORMAL HIGH (ref 4.0–11.0)
WBC: 13 10*3/uL — ABNORMAL HIGH (ref 4.0–11.0)

## 2023-05-26 LAB — LACTATE, SEPSIS
Lactate: 1.4 mmol/L (ref 0.5–2.2)
Lactate: 0.7 mmol/L (ref 0.5–2.2)

## 2023-05-26 LAB — BASIC METABOLIC PANEL
Anion Gap: 13 mmol/L (ref 5–15)
BUN: 12 mg/dL (ref 9–23)
CO2: 19 meq/L — ABNORMAL LOW (ref 20–31)
Calcium: 7.2 mg/dL — ABNORMAL LOW (ref 8.7–10.4)
Chloride: 103 meq/L (ref 98–107)
Creatinine: 0.88 mg/dL (ref 0.70–1.30)
GFR African American: >60
GFR Non-African American: >60
Glucose: 98 mg/dL (ref 74–106)
Potassium: 3.8 meq/L (ref 3.5–5.1)
Sodium: 135 meq/L — ABNORMAL LOW (ref 136–145)

## 2023-05-26 LAB — URINALYSIS
Bilirubin, Urine: NEGATIVE
Blood, Urine: NEGATIVE
Glucose, Ur: NEGATIVE mg/dL
Ketones, Urine: 15 mg/dL — AB
Leukocyte Esterase, Urine: NEGATIVE
Nitrite, Urine: NEGATIVE
Protein, Urine: NEGATIVE mg/dL
Specific Gravity, UA: 1.02 (ref 1.005–1.030)
Urobilinogen, Urine: 0.2 mg/dL (ref 0.0–1.0)
pH, Urine: 5.5 (ref 5.0–9.0)

## 2023-05-26 LAB — EKG 12-LEAD
Atrial Rate: 103 {beats}/min
Calculated P Axis: -13 degrees
Calculated R Axis: -36 degrees
Calculated T Axis: 76 degrees
P-R Interval: 164 ms
Q-T Interval: 338 ms
QRS Duration: 74 ms
QTC Calculation (Bezet): 442 ms
Ventricular Rate: 103 {beats}/min

## 2023-05-26 LAB — MAGNESIUM
Magnesium: 1.7 mg/dL (ref 1.6–2.6)
Magnesium: 1.5 mg/dL — ABNORMAL LOW (ref 1.6–2.6)

## 2023-05-26 LAB — TROPONIN
Troponin, High Sensitivity: 22 ng/L (ref 0–53)
Troponin, High Sensitivity: 25 ng/L (ref 0–53)

## 2023-05-26 LAB — PROTIME-INR
INR: 1.3 — ABNORMAL HIGH (ref 0.1–1.1)
Protime: 15 s — ABNORMAL HIGH (ref 10.2–12.9)

## 2023-05-26 LAB — COVID-19, FLU A/B, AND RSV COMBO
Influenza A PCR: NEGATIVE
Influenza B by PCR: NEGATIVE
RSV A/B PCR: NEGATIVE
SARS-CoV-2: NEGATIVE

## 2023-05-26 LAB — PROCALCITONIN: Procalcitonin: 0.58 ng/mL — ABNORMAL HIGH (ref 0.00–0.50)

## 2023-05-26 MED ORDER — SENNOSIDES 8.6 MG PO TABS
8.6 MG | Freq: Every day | ORAL | Status: DC | PRN
Start: 2023-05-26 — End: 2023-05-30

## 2023-05-26 MED ORDER — MAGNESIUM OXIDE -MG SUPPLEMENT 400 (240 MG) MG PO TABS
400 (240 Mg) MG | Freq: Every day | ORAL | Status: DC
Start: 2023-05-26 — End: 2023-05-30
  Administered 2023-05-26 – 2023-05-30 (×5): 400 mg via ORAL

## 2023-05-26 MED ORDER — ONDANSETRON HCL 4 MG/2ML IJ SOLN
4 MG/2ML | Freq: Four times a day (QID) | INTRAMUSCULAR | Status: DC | PRN
Start: 2023-05-26 — End: 2023-05-30

## 2023-05-26 MED ORDER — DOCUSATE SODIUM 100 MG PO CAPS
100 MG | Freq: Two times a day (BID) | ORAL | Status: DC | PRN
Start: 2023-05-26 — End: 2023-05-30

## 2023-05-26 MED ORDER — THERAPEUTIC MULTIVIT/MINERAL PO TABS
Freq: Every day | ORAL | Status: DC
Start: 2023-05-26 — End: 2023-05-30
  Administered 2023-05-26 – 2023-05-30 (×5): 1 via ORAL

## 2023-05-26 MED ORDER — GABAPENTIN 300 MG PO CAPS
300 | Freq: Three times a day (TID) | ORAL | Status: DC
Start: 2023-05-26 — End: 2023-05-26

## 2023-05-26 MED ORDER — GABAPENTIN 300 MG PO CAPS
300 MG | Freq: Four times a day (QID) | ORAL | Status: DC
Start: 2023-05-26 — End: 2023-05-30
  Administered 2023-05-26 – 2023-05-27 (×4): 600 mg via ORAL
  Administered 2023-05-27: 15:00:00 300 mg via ORAL
  Administered 2023-05-27 – 2023-05-30 (×12): 600 mg via ORAL

## 2023-05-26 MED ORDER — TAMSULOSIN HCL 0.4 MG PO CAPS
0.4 MG | Freq: Every day | ORAL | Status: DC
Start: 2023-05-26 — End: 2023-05-30
  Administered 2023-05-26 – 2023-05-30 (×5): 0.8 mg via ORAL

## 2023-05-26 MED ORDER — OMEPRAZOLE 20 MG PO CPDR
20 MG | Freq: Every day | ORAL | Status: DC
Start: 2023-05-26 — End: 2023-05-30
  Administered 2023-05-26 – 2023-05-30 (×5): 40 mg via ORAL

## 2023-05-26 MED ORDER — SODIUM CHLORIDE 0.9 % IV BOLUS
0.9 | Freq: Once | INTRAVENOUS | Status: AC
Start: 2023-05-26 — End: 2023-05-25
  Administered 2023-05-26: 01:00:00 1000 mL via INTRAVENOUS

## 2023-05-26 MED ORDER — TIMOLOL MALEATE 0.5 % OP SOLN
0.5 % | Freq: Every evening | OPHTHALMIC | Status: DC
Start: 2023-05-26 — End: 2023-05-30
  Administered 2023-05-27 – 2023-05-30 (×4): 1 [drp] via OPHTHALMIC

## 2023-05-26 MED ORDER — FERROUS SULFATE 325 (65 FE) MG PO TABS
325 (65 Fe) MG | Freq: Every day | ORAL | Status: DC
Start: 2023-05-26 — End: 2023-05-30
  Administered 2023-05-26 – 2023-05-30 (×5): 325 mg via ORAL

## 2023-05-26 MED ORDER — METHOCARBAMOL 500 MG PO TABS
500 MG | Freq: Three times a day (TID) | ORAL | Status: DC | PRN
Start: 2023-05-26 — End: 2023-05-30
  Administered 2023-05-27 – 2023-05-30 (×4): 500 mg via ORAL

## 2023-05-26 MED ORDER — DEXTROSE 50 % IV SOLN
50 % | INTRAVENOUS | Status: DC | PRN
Start: 2023-05-26 — End: 2023-05-30

## 2023-05-26 MED ORDER — KETOROLAC TROMETHAMINE 15 MG/ML IJ SOLN
15 MG/ML | Freq: Four times a day (QID) | INTRAMUSCULAR | Status: DC | PRN
Start: 2023-05-26 — End: 2023-05-30
  Administered 2023-05-26 – 2023-05-29 (×4): 15 mg via INTRAVENOUS

## 2023-05-26 MED ORDER — MELATONIN 3 MG PO TABS
3 MG | Freq: Every evening | ORAL | Status: DC | PRN
Start: 2023-05-26 — End: 2023-05-30

## 2023-05-26 MED ORDER — HEPARIN SODIUM (PORCINE) 5000 UNIT/ML IJ SOLN
5000 UNIT/ML | Freq: Two times a day (BID) | INTRAMUSCULAR | Status: DC
Start: 2023-05-26 — End: 2023-05-30
  Administered 2023-05-26 – 2023-05-30 (×7): 5000 [IU] via SUBCUTANEOUS

## 2023-05-26 MED ORDER — ONDANSETRON HCL 4 MG/2ML IJ SOLN
4 | Freq: Once | INTRAMUSCULAR | Status: AC
Start: 2023-05-26 — End: 2023-05-25
  Administered 2023-05-26: 03:00:00 4 mg via INTRAVENOUS

## 2023-05-26 MED ORDER — ACETAMINOPHEN 10 MG/ML IV SOLN
10 | Freq: Once | INTRAVENOUS | Status: AC
Start: 2023-05-26 — End: 2023-05-25
  Administered 2023-05-26: 02:00:00 1000 mg via INTRAVENOUS

## 2023-05-26 MED ORDER — ASPIRIN 81 MG PO CHEW
81 MG | Freq: Every day | ORAL | Status: DC
Start: 2023-05-26 — End: 2023-05-30
  Administered 2023-05-26 – 2023-05-30 (×4): 81 mg via ORAL

## 2023-05-26 MED ORDER — ACETAMINOPHEN 325 MG PO TABS
325 MG | Freq: Four times a day (QID) | ORAL | Status: DC | PRN
Start: 2023-05-26 — End: 2023-05-30

## 2023-05-26 MED ORDER — ALLOPURINOL 300 MG PO TABS
300 MG | Freq: Every day | ORAL | Status: DC
Start: 2023-05-26 — End: 2023-05-30
  Administered 2023-05-26 – 2023-05-30 (×5): 300 mg via ORAL

## 2023-05-26 MED ORDER — NALOXONE HCL 0.4 MG/ML IJ SOLN
0.4 MG/ML | INTRAMUSCULAR | Status: DC | PRN
Start: 2023-05-26 — End: 2023-05-30

## 2023-05-26 MED ORDER — MAGNESIUM SULFATE IN D5W 1-5 GM/100ML-% IV SOLN
1-5 | Freq: Once | INTRAVENOUS | Status: AC
Start: 2023-05-26 — End: 2023-05-26
  Administered 2023-05-26: 15:00:00 1000 mg via INTRAVENOUS

## 2023-05-26 MED ORDER — ACETAMINOPHEN 10 MG/ML IV SOLN
10 | Freq: Four times a day (QID) | INTRAVENOUS | Status: DC | PRN
Start: 2023-05-26 — End: 2023-05-29
  Administered 2023-05-26 – 2023-05-27 (×3): 1000 mg via INTRAVENOUS

## 2023-05-26 MED ORDER — SODIUM CHLORIDE 0.9 % IV SOLN
0.9 | INTRAVENOUS | Status: AC
Start: 2023-05-26 — End: 2023-05-26
  Administered 2023-05-26: 05:00:00 via INTRAVENOUS

## 2023-05-26 MED ORDER — GLUCAGON (RDNA) 1 MG IJ KIT
1 MG | INTRAMUSCULAR | Status: DC | PRN
Start: 2023-05-26 — End: 2023-05-30

## 2023-05-26 MED ORDER — HYDROMORPHONE HCL 1 MG/ML IJ SOLN
1 | INTRAMUSCULAR | Status: AC
Start: 2023-05-26 — End: 2023-05-25
  Administered 2023-05-26: 03:00:00 0.25 mg via INTRAVENOUS

## 2023-05-26 MED FILL — MAGNESIUM SULFATE IN D5W 1-5 GM/100ML-% IV SOLN: 1-5 GM/100ML-% | INTRAVENOUS | Qty: 100 | Fill #0

## 2023-05-26 MED FILL — GABAPENTIN 300 MG PO CAPS: 300 MG | ORAL | Qty: 2 | Fill #0

## 2023-05-26 MED FILL — OMEPRAZOLE 20 MG PO CPDR: 20 MG | ORAL | Qty: 2 | Fill #0

## 2023-05-26 MED FILL — HEPARIN SODIUM (PORCINE) 5000 UNIT/ML IJ SOLN: 5000 UNIT/ML | INTRAMUSCULAR | Qty: 1 | Fill #0

## 2023-05-26 MED FILL — SODIUM CHLORIDE 0.9 % IV SOLN: 0.9 % | INTRAVENOUS | Qty: 1000 | Fill #0

## 2023-05-26 MED FILL — ASPIRIN 81 MG PO CHEW: 81 MG | ORAL | Qty: 1 | Fill #0

## 2023-05-26 MED FILL — ACETAMINOPHEN 10 MG/ML IV SOLN: 10 MG/ML | INTRAVENOUS | Qty: 100 | Fill #0

## 2023-05-26 MED FILL — ALLOPURINOL 300 MG PO TABS: 300 MG | ORAL | Qty: 1 | Fill #0

## 2023-05-26 MED FILL — HYDROMORPHONE HCL 1 MG/ML IJ SOLN: 1 MG/ML | INTRAMUSCULAR | Qty: 1 | Fill #0

## 2023-05-26 MED FILL — KETOROLAC TROMETHAMINE 15 MG/ML IJ SOLN: 15 MG/ML | INTRAMUSCULAR | Qty: 1 | Fill #0

## 2023-05-26 MED FILL — MAGNESIUM OXIDE -MG SUPPLEMENT 400 (240 MG) MG PO TABS: 400 (240 Mg) MG | ORAL | Qty: 1 | Fill #0

## 2023-05-26 MED FILL — ONDANSETRON HCL 4 MG/2ML IJ SOLN: 4 MG/2ML | INTRAMUSCULAR | Qty: 2 | Fill #0

## 2023-05-26 MED FILL — THERAPEUTIC-M PO TABS: ORAL | Qty: 1 | Fill #0

## 2023-05-26 MED FILL — TIMOLOL MALEATE 0.5 % OP SOLN: 0.5 % | OPHTHALMIC | Qty: 5 | Fill #0

## 2023-05-26 MED FILL — TAMSULOSIN HCL 0.4 MG PO CAPS: 0.4 MG | ORAL | Qty: 2 | Fill #0

## 2023-05-26 MED FILL — FEROSUL 325 (65 FE) MG PO TABS: 325 (65 Fe) MG | ORAL | Qty: 1 | Fill #0

## 2023-05-26 NOTE — Progress Notes (Signed)
 Patient: Tommy Brown     78 y.o. male  Date of Admission:  05/25/2023        Assessment   Minimally displaced acute intertrochanteric right femoral neck fracture.   S/p fall  Acute metabolic encephalopathy  Leukocytosis   NSVT  Multivessel CAD status post CABG in 1995. Cardiac catheterization in 2006 revealed patent bypass grafts. 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 Pending ortho surgical plans for OR tomorrow - pending cardiology clearance  Noted NSVT. Replete Mg. Pt reports no chest pain or SOB or anginal symptoms. Mets >4. He has known cardiac risk factors as outlined above. Cardiology consulted for preop assessment.   Monitor mental status, ok to dc neurochecks  Monitor wbc, improved. Afebrile, no definite signs of infection, hold on abx at this time. Follow culture data. UA / CXR neg  Patient wife requesting palliative care consult  Cont meds as ordered    DVT ppx: heparin  Electrolytes: replete Mg and recheck    I have reviewed and ordered pertinent labs and radiological studies, reconciled medications, and reviewed medical records/notes.    I have discussed with patient and nursing staff.    Patient's overall condition: stable     A total of 51 minutes was spent on this follow-up patient encounter for medical problems as outlined above, which included obtaining and reviewing history, reviewing labs/testing/imaging ordered at prior visit, performing exam, discussing treatment plan, ordering additional studies, and documenting in the record.       Current Inpatient Meds and  Allergies:    Scheduled Meds:  gabapentin, 600 mg, 4x daily  heparin (porcine), 5,000 Units, BID  timolol, 1 drop, Nightly  allopurinol, 300 mg, Daily  ferrous sulfate, 325 mg, Daily with breakfast  tamsulosin, 0.8 mg, Daily  aspirin, 81 mg, Daily  omeprazole, 40 mg, Daily  therapeutic multivitamin-minerals, 1 tablet, Daily  magnesium oxide, 400 mg, Daily      Drips:   sodium chloride, Last Rate: 75 mL/hr at 05/26/23 0038        Allergies:  Allergies   Allergen Reactions    Codeine Itching and Swelling    Oxycodone Anaphylaxis    Amoxicillin Diarrhea    Hydroxyzine Pamoate Itching and Other (See Comments)     Other reaction(s): Unknown    Meperidine Itching, Other (See Comments), Palpitations and Swelling     Other reaction(s): Unknown    Metoclopramide Itching, Other (See Comments) and Swelling     Other reaction(s): Other (See Comments)  Reaction unknown   Reaction unknown     Morphine Nausea And Vomiting and Other (See Comments)    Oxycodone-Acetaminophen Itching    Prochlorperazine Itching and Other (See Comments)     Other reaction(s): Unknown (comments)  Other reaction(s): Unknown  Unsure of reaction    Amoxicillin Diarrhea    Codeine Itching and Swelling    Hydroxyzine Hcl Hives    Hydroxyzine Pamoate Itching    Metoclopramide Itching and Swelling    Other Other (See Comments)     VISTAID  ANESTHESIA    Other  VISTAID ANESTHESIA    Oxycodone-Acetaminophen Itching    Prochlorperazine Itching    Meperidine Itching, Swelling and Palpitations    Morphine Nausea And Vomiting       Subjective:      Chief Complaint: Altered Mental Status and Fall      No new complaints. Feeling ok. Per RN NSVT today  Denies CP or SOB.   Pt and wife cannot remember if he has had a stress test recently.       Objective:      Physical Exam:   BP (!) 142/86   Pulse 84   Temp 99 F (37.2 C) (Temporal)   Resp 17   Ht 1.778 m (5\' 10" )   Wt 65.8 kg (145 lb)   SpO2 97%   BMI 20.81 kg/m   Gen: Well-nourished, well-developed, in  no apparent distress, awake  Neuro: No obvious focal neurological deficits.   Cardio: Regular rhythm and rate, S1-S2  Pulm: Clear to auscultation bilaterally, no crackles, no wheezing, no cough  GI: Soft, nontender, nondistended, normoactive bowel sounds  Extremities: +edema, see ortho note for details  Skin: Clear, dry, intact anteriorly, see nursing notes for posterior details  Psych: Alert and awake        Labwork and Ancillary Studies:    Clinical laboratory tests, radiology, imaging, other test results, and medications were individually reviewed.    CBC w/Diff   Recent Labs     05/25/23  2026 05/26/23  0108   WBC 14.6* 13.0*   RBC 4.33 3.64*   HCT 40.4 34.3*   MCV 93.3 94.2   MCH 29.8 29.9   MCHC 31.9 31.8   RDW 49.0* 49.0*   MPV 10.4* 10.8*      Recent Labs     05/25/23  2026 05/26/23  0108   MONOS 8.0 8.1   BASOS 0.1 0.1   RDW 49.0* 49.0*       Comprehensive Metabolic Profile   Recent Labs     05/25/23  2026 05/26/23  0108   NA 131* 135*   CO2 24 19*   ANIONGAP 11 13   BUN 13 12   GLUCOSE 128* 98      Recent Labs     05/25/23  2026 05/26/23  0108   CALCIUM 9.0 7.2*   ALBUMIN 2.6*  --    ALKPHOS 148*  --        XR PELVIS (1-2 VIEWS)  Addendum Date: 05/25/2023  Lucency overlying right greater trochanter and extending towards the lesser trochanter is felt to represent a comminuted mildly displaced acute intertrochanteric fracture. Electronically signed by: Earline Mayotte, MD 05/25/2023 11:54 PM EDT          Workstation ID: [id number]     Result Date: 05/25/2023  IMPRESSION:  No fracture. Electronically signed by: Earline Mayotte, MD 05/25/2023 10:11 PM EDT          Workstation ID: [id number]     XR FEMUR RIGHT (MIN 2 VIEWS)  Result Date: 05/25/2023  IMPRESSION:  Minimally displaced intertrochanteric right femoral neck fracture.  Electronically signed by: Earline Mayotte, MD 05/25/2023 11:52 PM EDT          Workstation ID: [id number]     CT HEAD WO CONTRAST  Result Date: 05/25/2023  Impression: Chronic  microvascular ischemic changes. Electronically signed by: Earline Mayotte, MD 05/25/2023 10:42 PM EDT          Workstation ID: [id number]     CT CERVICAL SPINE WO  CONTRAST  Result Date: 05/25/2023  IMPRESSION: No acute intracranial abnormality. No acute fractures or dislocation in the cervical spine. Electronically signed by: Consuello Masse, MD 05/25/2023 10:37 PM EDT          Workstation ID: [id number]     XR CHEST PORTABLE  Result Date: 05/25/2023  IMPRESSION:  No acute disease. Electronically signed by: Earline Mayotte, MD 05/25/2023 10:12 PM EDT          Workstation ID: [id number]     XR FEMUR LEFT (MIN 2 VIEWS)  Result Date: 05/25/2023  IMPRESSION:  No fracture. Electronically signed by: Earline Mayotte, MD 05/25/2023 10:10 PM EDT          Workstation ID: [id number]         Note:  This was dictated using a computer transcription program.  Although proofread, it may contain computer transcription errors or phonetic errors.  Other human proofreading errors may exist.  Corrections may be performed at a later time.  If there is any question regarding the details of this note, please feel free to contact me directly.             Lamont Dowdy, M.D.   Division of Hospital Medicine  [facility]  Please epic chat between 7am and 5pm or page the dedicated pager after hours.

## 2023-05-26 NOTE — Consults (Signed)
 [address]  [place], [place] [address]  Office: [phone]    Orthopaedic Consult Note    Patient: Tommy Brown               Sex: male          DOA: 05/25/2023       Date of Birth:  30-Dec-1945      Age:  78 y.o.        LOS:  LOS: 0 days        CC:  Right hip pain, status post fall.    HPI:  78 year old gentleman with history of pancreatic cancer, presents after fall.  Complains of right hip pain.     PAST MEDICAL HISTORY:  Past Medical History:   Diagnosis Date    Arthritis     Benign prostate hyperplasia     Bladder infection     Chronic kidney disease     Coronary arteriosclerosis     Coronary atherosclerosis of artery bypass graft     Decreased testosterone level     Diverticular disease     Essential hypertension     Gout     History of acute renal failure     History of anemia     History of malignant neoplasm of pancreas     Hx of CABG     Kidney stone     Kidney stones     Neuropathy     NECK, SHOULDERS    Pancreatic cancer (HCC)     Sepsis (HCC)          PAST SURGICAL HISTORY:    Past Surgical History:   Procedure Laterality Date    CABG, ARTERY-VEIN, FOUR  1995    CAPSULE ENDOSCOPY N/A 05/23/2022    BOWEL SMALL CAPSULE ENDOSCOPY performed by Franky Macho, MD at [facility]COPY    CARDIAC CATHETERIZATION  2006    CERVICAL FUSION      COLONOSCOPY N/A 07/10/2020    DIAGNOSTIC COLONOSCOPY polypectomy w/hot snare, cold snare , Clip x 1  performed by Jonna Coup, MD at [facility] ENDOSCOPY    COLONOSCOPY      COLONOSCOPY N/A 05/17/2022    COLONOSCOPY DIAGNOSTIC aborted at 25cm performed by Jonna Coup, MD at [facility]COPY    CORONARY ARTERY BYPASS GRAFT  1995    LUMBAR DISCECTOMY      OTHER SURGICAL HISTORY  01/01/2011    whipple procedure done for pancreatic cancer    UPPER GASTROINTESTINAL ENDOSCOPY N/A 05/17/2022    ESOPHAGOGASTRODUODENOSCOPY DIAGNOSTIC ONLY performed by Jonna Coup, MD at [facility] ENDOSCOPY    UROLOGICAL SURGERY  2012    Percutaneous extraction of a kidney stone w/ fragmentation  procedure          FAMILY HISTORY:  Family History   Problem Relation Age of Onset    Emphysema Brother     Alcohol Abuse Father     Heart Disease Brother          SOCIAL HISTORY:  Social History     Socioeconomic History    Marital status: Married   Tobacco Use    Smoking status: Never     Passive exposure: Never    Smokeless tobacco: Never   Vaping Use    Vaping status: Never Used   Substance and Sexual Activity    Alcohol use: Never    Drug use: Never    Sexual activity: Defer   Social History Narrative    **  Merged History Encounter **         ** Merged History Encounter **        Social Drivers of Health     Food Insecurity: No Food Insecurity (05/26/2023)    Hunger Vital Sign     Worried About Running Out of Food in the Last Year: Never true     Ran Out of Food in the Last Year: Never true   Transportation Needs: No Transportation Needs (05/26/2023)    PRAPARE - Therapist, art (Medical): No     Lack of Transportation (Non-Medical): No   Housing Stability: Low Risk  (05/26/2023)    Housing Stability Vital Sign     Unable to Pay for Housing in the Last Year: No     Number of Times Moved in the Last Year: 0     Homeless in the Last Year: No         REVIEW OF SYSTEMS:  See chart.    PHYSICAL EXAMINATION:    Pain, right groin and right thigh.    Exam for E/M Coding:  GENERAL: well developed, well nourished.  EYES: lids and conjunctiva are normal  E/N/T: Normal external ears and nose;  NECK: neck supple  CARDIOVASCULAR: No edema  MUSCULOSKELETAL: digits/nails: no clubbing, cyanosis, or evidence of ischemia or infection;   SKIN: no ulcerations, lesions or rashes  NEUROLOGIC: sensation, grossly normal;  PSYCHIATRIC: Mental status:alert and oriented x3; appropriate affect and demeanor; good insight and judgement      Procedures/imaging: see electronic medical records for all procedures, Xrays and details which were not copied into this note but were reviewed.     EXAMINATION: XR FEMUR RIGHT (MIN  2 VIEWS)     CLINICAL INDICATION: Injury/Pain     COMPARISON:  None.     TECHNIQUE: 2 views right femur     FINDINGS:  Minimally displaced intertrochanteric right femoral neck fracture.     Impression  IMPRESSION:  Minimally displaced intertrochanteric right femoral neck fracture.    Allergies:  Allergies   Allergen Reactions    Codeine Itching and Swelling    Oxycodone Anaphylaxis    Amoxicillin Diarrhea    Hydroxyzine Pamoate Itching and Other (See Comments)     Other reaction(s): Unknown    Meperidine Itching, Other (See Comments), Palpitations and Swelling     Other reaction(s): Unknown    Metoclopramide Itching, Other (See Comments) and Swelling     Other reaction(s): Other (See Comments)  Reaction unknown   Reaction unknown     Morphine Nausea And Vomiting and Other (See Comments)    Oxycodone-Acetaminophen Itching    Prochlorperazine Itching and Other (See Comments)     Other reaction(s): Unknown (comments)  Other reaction(s): Unknown  Unsure of reaction    Amoxicillin Diarrhea    Codeine Itching and Swelling    Hydroxyzine Hcl Hives    Hydroxyzine Pamoate Itching    Metoclopramide Itching and Swelling    Other Other (See Comments)     VISTAID  ANESTHESIA    Other      VISTAID ANESTHESIA    Oxycodone-Acetaminophen Itching    Prochlorperazine Itching    Meperidine Itching, Swelling and Palpitations    Morphine Nausea And Vomiting       Home Medications:  Prior to Admission Medications   Prescriptions Last Dose Informant Patient Reported? Taking?   Multiple Vitamins-Minerals (THERAPEUTIC MULTIVITAMIN-MINERALS) tablet   Yes No   Sig: Take 1 tablet  by mouth daily Centrum Silver for men   Omega-3 Fatty Acids-Hemp Extra (HEMP MONOPURE PO)   Yes No   Sig: Take by mouth   acetaminophen (TYLENOL) 325 MG tablet 05/25/2023  Yes Yes   Sig: Take 2 tablets by mouth every 6 hours as needed   allopurinol (ZYLOPRIM) 300 MG tablet   Yes No   Sig: Take 1 tablet by mouth daily   allopurinol (ZYLOPRIM) 300 MG tablet   Yes No    Sig: Take 1 tablet by mouth daily   amLODIPine (NORVASC) 2.5 MG tablet   No No   Sig: Take 1 tablet by mouth daily   aspirin 81 MG EC tablet 05/24/2023  Yes Yes   Sig: Take 1 tablet by mouth daily   aspirin 81 MG chewable tablet   Yes No   Sig: Take 1 tablet by mouth daily RESUME THIS AFTER ONE WEEK, ONLY IF NO MORE BLEEDING IN BOWEL MOVEMENTS   bimatoprost (LUMIGAN) 0.01 % SOLN ophthalmic drops   Yes No   Sig: Place 1 drop into the right eye nightly   ferrous sulfate (IRON 325) 325 (65 Fe) MG tablet   Yes No   Sig: Take 1 tablet by mouth daily (with breakfast)   gabapentin (NEURONTIN) 600 MG tablet   Yes No   Sig: Take 2 tablets by mouth 4 times daily.   gabapentin (NEURONTIN) 600 MG tablet 05/24/2023  Yes Yes   Sig: Take 1 tablet by mouth 3 times daily.   lipase-protease-amylase (CREON) 24000-76000 units delayed release capsule   Yes No   Sig: Take 1 capsule by mouth 3 times daily (with meals) And with snacks   magnesium oxide (MAG-OX) 400 (240 Mg) MG tablet   Yes No   Sig: Take 1 tablet by mouth daily   metoprolol tartrate (LOPRESSOR) 25 MG tablet   No No   Sig: Take 1 tablet by mouth 2 times daily   omeprazole (PRILOSEC) 20 MG delayed release capsule   Yes No   Sig: Take 2 capsules by mouth daily For gerds   omeprazole (PRILOSEC) 40 MG delayed release capsule   Yes No   Sig: Take 1 capsule by mouth daily   pravastatin (PRAVACHOL) 10 MG tablet   No No   Sig: Take 1 tablet by mouth nightly   tamsulosin (FLOMAX) 0.4 MG capsule   Yes No   Sig: Take 2 capsules by mouth daily   tamsulosin (FLOMAX) 0.4 MG capsule   Yes No   Sig: Take 1 capsule by mouth in the morning and at bedtime   temazepam (RESTORIL) 15 MG capsule   Yes No   Sig: Take 1 capsule by mouth nightly as needed for Anxiety or Sleep.   testosterone cypionate (DEPOTESTOTERONE CYPIONATE) 200 MG/ML injection   Yes No   Sig: INJECT 0.5 MILLILITER BY INTRAMUSCULAR ROUTE EVERY 2 WEEKS   timolol (BETIMOL) 0.5 % ophthalmic solution   Yes No   Sig: Place 1 drop  into the left eye at bedtime      Facility-Administered Medications: None         Chart and notes reviewed. Data reviewed. I have evaluated and examined the patient.                IMPRESSION:  Right intertrochanteric hip fracture.     DISCUSSION:  Risk and benefits of treatment options discussed with patient family.  Will proceed with right hip trochanteric nailing.  Will schedule for surgery tomorrow,  1 April.  Okay to eat today.  N.p.o. after midnight.  Thank you for this consultation.       Arita Miss, MD   05/26/23

## 2023-05-26 NOTE — Consults (Signed)
 PALLIATIVE CARE    [x]  INITIAL CONSULT ENCOUNTER []  FOLLOW UP ENCOUNTER     AVAILABLE: Monday to Friday From 8:00 AM to 4:30 PM at [phone] (Follow Prompts)   AFTER HOURS: Contact ATTENDING PHYSICIAN As Palliative Care Not Available     PATIENT INFORMATION     Name: Tommy Brown  MRN:   [id number]   DOB:   04-16-1945   Date/Time of Service:  05/26/2023 5:49 PM  Admission Date:  05/25/2023   Attending Physician: Tommy Dowdy, MD    Primary Care Physician: Tommy Polka, MD     Palliative Care Consult   Palliative Care Consulted By: Dr Tommy Brown  On: 05/25/23   For:  Goals of Care in setting of wife requesting palliative care consult , history of pancreatic cancer, new lesions on recent mri     PATIENT CAPACITY FOR HEALTHCARE DECISION MAKING    Code of [place]: Health Care Decisions Act   54.02-2981.2. Capacity ??Click to Access Code of [place]     [x]   Patient is presumed capable of making informed healthcare decisions with Full Capacity   []   Patient has been determined incapable of making informed healthcare decision(s):    []   No Capacity as determined by Attending Physician due to unconsciousness or profound impairment of consciousness.     []   No Capacity as determined by Attending Physician and Capacity Reviewer (Telepsych)    []   Patient's capability of making informed healthcare decision(s) needs to be determined:    []   Appears to have no capacity and discussed with Attending Physician for further evaluation    []   Capacity is unclear and discussed with Attending Physician for further evaluation    []   Capacity appears to be waxing and waning, discussed with Attending Physician for evaluation       AUTHORIZED DECISION MAKER   (if patient is currently, or in the future, incapable of making informed decisions)     [x]  Named in Advance Directive as Medical Power of Attorney (MPOA)   []  Court Appointed Legal Guardian   []  No Advance Directive and Decision Maker Identified per  [facility] Decisions Act Procedure in Absence of an Advance Directive:                               ??Click to Access Code of [place]    []  Spouse  or  []  Adult Child  []  Adult Children (2+)  or  []  Parent  []  Both Parents  or  []  Adult Sibling  []  Adult Siblings (2+)  or  []  Other Relative (in descending order of blood relationship)   []  Currently Unable to Identify any Decision Maker     ACP Navigator Updated:  [x]  Healthcare Decision Maker []  Patient Capacity      Decision Maker(s):    Primary Decision MakerFreida Brown [phone]    Secondary Decision Maker: Tommy Brown,Tommy Brown - Child - [phone]      PALLIATIVE GOALS OF CARE     Goals of Care Conversation with:    [x]  Patient with decision making capacity  []  Patient without capacity, but present    [x]  Authorized Decision Maker(s) as above  []  Other(s):   Others Present:   []  No  []  Yes:      []   Patient has No or Undetermined Capacity for Healthcare Decision Making and patient's    previously expressed values  and wishes (per prior advanced directive(s) or prior medical records/history) have been discussed and shared with authorized decision maker(s).      Current Code Status: DNR     Summary Of Conversation And Current Treatment Preferences:   DNR, durable DNR reviewed and signed with [name]    Continue current care  Including planned orthopedic surgery 05/27/2023    Both Mr. and Mrs. Tommy Brown would like to speak with their regular oncologist Dr. Jodie Brown about the concern for recurrence for his pancreatic cancer for goals of care discussions beyond the ortho procedure, I updated IM Dr. Willette Brown    [phone] in person bedside meeting with [name] and his wife Mrs. Tommy Brown:    They both confirm they live together PTA, he was high functioning independent with ADLs and IADLs and was still working, rode his bicycle to and from work at [employer].  [name] and I reviewed and he confirmed his living will from 1995 remains current to  his wishes today.  However [name] would like to change his advanced directive MPOA designation, we reviewed his document from 1995, he would like to name his wife Mrs. Tommy Brown his primary medical decision maker and his daughter Tommy Brown his secondary medical decision maker; we completed the As You Wish advance directive section 1 MPOA designation only today 05/26/2023.    They both confirmed their decision to pursue orthopedic procedure to get a "rod" inserted because of his "broken hip" plan for tomorrow 05/27/2023; they discussed his previous pancreatic cancer diagnosis and that he has been cancer free for 13 years, and actually had a follow-up appointment scheduled for tomorrow 05/27/2023 with their primary oncologist Dr. Jodie Brown which they have canceled given this hospitalization and planned orthopedic surgery     Discussed purpose of palliative care consult to discuss goals of care, code status in the setting of the above and this current acute hospitalization especially if Tommy Brothers Sr were to have an emergency or become sicker    Mr. and Mrs. Tommy Brown would like to have discussions with oncology Dr. Jodie Brown regarding the concern for cancer recurrence from his MRI before they can discuss and decide on further goals of care    Discussed full CODE STATUS versus DNR CODE STATUS and what a resuscitation event entails  [name] consistently confirms his DNR CODE STATUS as a longstanding decision of his noting if his heart stops beating or he stopped breathing "I do not want any life support of any kind"     Hospice Eligibility:    []    Not Applicable      []  NOT Hospice Eligible    []   Hospice Eligible:    []   Hospice is NOT CONSISTENT with Goals of Care    []   Hospice is BEING CONSIDERED at this time, but not yet aligned with goals of care    []   Patient would like to enroll with Hospice as part of discharge plan    []  Hospice is BEING CONSIDERED at this time, but patient capacity for healthcare decision  making needs determination.    []  MPOA/Decision maker would like to enroll patient with Hospice as part of discharge plan, as patient is determined to not have capacity for healthcare decisions as indicated above      Referrals To:   []  Outpatient Palliative Care  []  Home Health  []  Remote Patient Monitoring  []  Hospice   []  None  Updates Provided:   [x]   Attending Physician/Provider: Lamont Dowdy, MD via secure chat   []   Other Physician/Provider (consultant):     []   Nursing:    []   Care Manager/Discharge Planner:    []   Other: [facility] and Supportive Care IDT & [facility]      DISPOSITION PLAN     TBD    Per my 05/26/2023 discussion with Mr. and Mrs. Tommy Brown: Lived at home with her PTA, high functioning     []   Palliative care plan and/or goals of care continue to be addressed and additional follow up is planned during this hospitalization.     []   Palliative care plan and/or goals of care are established and Palliative Care Team will sign off at this time. Please re-consult should circumstances change or additional needs arise.      []   No barriers to discharge have been identified related to the palliative care plan.     [x]   Discharge as per Attending Physician's clinical judgment with discharge/disposition considerations as per Care Management.     []   Disposition with [facility] with [facility] Supportive Care team assisting with discharge/disposition considerations concurrent with Care Management.      PALLIATIVE CARE ASSESSMENT/PLAN     Mechanical fall with right femoral neck fracture  Right femur x-ray 05/25/2023 finding minimally displaced intertrochanteric right femoral neck fracture  Per orthopedic surgery Dr. Renaldo Harrison' 05/26/2023 eval: Right hip trochanteric nailing scheduled for 05/27/2023  Cleared for surgery by cardiology Dr. Nicki Guadalajara Sposato      Pancreatic cancer status post Whipple in 2006, chemotherapy and radiation with concern for recurrence:  CT abdomen pelvis  05/07/2023 finding mass in the body of the pancreas, consistent with recurrent tumor and was not not identified in prior exam 05/18/2022   CT abdomen pelvis on 09/09/2022 noting pancreas: Unremarkable, hepatobiliary: Numerous hepatic cysts  MRI abdomen 05/16/2023 finding numerous peripherally enhancing hepatic masses worrisome for metastatic disease progression, infection less likely; pancreatic body mass correlates with nonenhancing cystic lesion, cystic pancreatic neoplasm versus cystic dilation of the pancreatic duct status post Whipple procedure, new from prior exam; multiple bilateral renal cysts    Medical Debility/Frailty due to femoral neck fracture  PPS 30% currently; 100% PTA    Prescription Monitoring Program Accessed on 05/26/2023  Tramadol 50mg  #40 x5d filled on 05/21/23, 50mg  #20 x7d filled on 05/16/22  Diazepam 5mg  #1 x1d filled on 05/16/23  Testosterone 100mg /ml #6 x84 days - #6 x42 days filled ~Q2-60mos on 05/13/23 - 11/28/21  Temazepam 15mg  #30 x30 days filled ~Q1-29mos on 04/21/23 - 06/18/21  Gabapentin 600mg  #360 x90d filled ~Q3-4 mos on 03/24/23 - 07/25/21  Pregabalin 100mg  #14 x7d filled on 05/30/22    Significant comorbidities/medical history impacting their prognosis and or medical management include: HTN; CKD 3; CAD status post CABG; gout; GIB with GI suspecting due to diverticular bleed 04/2022; nephrolithiasis status post lithotripsy, left JJ stent 2020 per urology; bilateral renal artery stenosis seen on CT abdomen pelvis 09/09/2022 with OP vascular surgery follow-up on 10/15/2022 with Dr. Sherril Croon noting no need for vascular intervention as asymptomatic without changes in hypertension or renal function      PRESCRIPTION MONITORING PROGRAM (PMP) ACCESSED       ??Click to Access PMP System      []  No record found  [x]  Reviewed 05/26/2023 []  Prior review during hospitalization.      ADVANCED DIRECTIVES     Advanced Directive for  Healthcare and Health Care Decision Maker   []   No Advance Directive    []  Completed  with patient []  Patient desires to complete []  Offered, but declined  []  Patient unable to complete due to capacity, as noted above   []  Advance Directive Completed During this Hospitalization    []  Medical Power of Attorney (designated individual noted above) []  Living Will   [x]   Prior Advanced Directive Located In EHR dated 02/01/1994 to be replaced with As You Wish advanced directive for MPOA redesignation completed on 05/26/2023 currently pending scan to epic       [x]  Medical Power of Attorney (dated 02/01/1994 to be replaced with As You Wish advanced directive for MPOA redesignation completed on 05/26/2023 currently pending scan to epic)   [x]  Living Will dated 02/01/1994    [x]  Prior advanced directive has been reviewed with patient for continued accuracy    [x]  Prior advanced directive has been reviewed for awareness with MPOA or Decision Maker      Durable Do Not Resuscitate Order (DDNR)   []   Prior DDNR found in EHR and reviewed with []  Patient []  MPOA or Decision Maker   [x]   DDNR Completed During this Hospitalization    [x]  DDNR signed by patient 05/26/2023 and is currently pending scan to epic  []  DDNR signed by MPOA/Decision Maker as patient is determined to have no capacity, per above   []   DDNR not completed      Physician Orders for Life-Sustaining Treatment (POLST)    []   Prior POLST Form Found in EHR Dated:     []  Prior POLST Form reviewed for continued accuracy with Patient and remains unchanged    []  Prior POLST Form updated to reflect changes in patient goals of care    []  Prior POLST Form has been reviewed for awareness with MPOA or Decision Maker   []   POLST Form Completed During this Hospitalization    []  POLST signed by patient  []  POLST signed by MPOA/Decision Maker as patient is determined to have no capacity, per above   []   POLST Form []  Reviewed, but not completed, or []  not reviewed, due to:       HISTORY OF PRESENT ILLNESS (HPI) AND PAST MEDICAL HISTORY (PMH)      Edinson Domeier Sr is a 78  y.o. male with a past medical history of:   HTN; CKD 3; CAD status post CABG; gout; GIB with GI suspecting due to diverticular bleed 04/2022; nephrolithiasis status post lithotripsy, left JJ stent 2020 per urology; bilateral renal artery stenosis seen on CT abdomen pelvis 09/09/2022 with OP vascular surgery follow-up on 10/15/2022 with Dr. Sherril Croon noting no need for vascular intervention as asymptomatic without changes in hypertension or renal function; pancreatic cancer status post Whipple in 2006, chemotherapy and radiation; dementia with behavioral disturbance    Sircharles Holzheimer Sr was admitted on 05/25/2023:    For AMS status post ground-level fall 05/24/2023 with wife reporting has been getting progressively weaker over the past week    Tommy Brothers Sr is being treated for:   Mechanical fall with right femoral neck fracture with orthopedic surgery Dr. Renaldo Harrison planning right hip trochanteric nailing scheduled for 05/27/2023; concern for pancreatic cancer recurrence with CT abdomen pelvis 05/07/2023 finding mass in the body of the pancreas, consistent with recurrent tumor and was not identified in prior exam 05/18/2022      Palliative Medicine/Palliative Care History:   []   Reviewed prior palliative medicine/palliative care  history from:    []  [facility] []  [facility] []  Other:    [x]   No prior palliative medicine/palliative care history found in chart review 05/26/2023      SOCIAL HISTORY      Marital Status/Living Situation: Per my 05/26/2023 discussion with Mr. and Mrs. Tommy Brown: They both confirm they live together PTA, he was high functioning independent with ADLs and IADLs and was still working, rode his bicycle to and from work at [employer].  [name] and I reviewed and he confirmed his living will from 1995 remains current to his wishes today.  However [name] would like to change his advanced directive MPOA designation, we reviewed his document from 1995, he would like to name his wife Mrs. Tommy Brown his primary  medical decision maker and his daughter Tommy Brown his secondary medical decision maker; we completed the As You Wish advance directive section 1 MPOA designation only today 05/26/2023.    Any spiritual / religious concerns: []  No []  Yes []  Referred/Followed by Pastoral Care    Spiritual Affiliation: [facility]    Psychological/Psychosocial Distress: []  None []  Mild []  Moderate []  Severe      Anticipatory Grief: []  Normal []  Maladaptive (social work/pastoral care aware or following)    Other personal, social, and/or family history:      Opioid Risk Tool (ORT)   []  Assessed on: May 26, 2023 []  Unable to assess due to:    MARK EACH BOX THAT APPLIES MALE MALE    Family History of Substance Abuse              Alcohol []  1 []  3            Illegal drugs []  2 []  3            Rx drugs []  4 []  4    Personal History of Substance Abuse              Alcohol []  3 []  3            Illegal drugs []  4 []  4            Rx drugs []  5 []  5    Age between 16-45 years []  1 []  1    History of Preadolescent Sexual Abuse []  3 []  0    Psychological Disease              ADD, OCD, Bipolar, Schizophrenia []  2 []  1            Depression []  1 []  1    Scoring Totals: N/A TBD    Risk Scoring Legend: 3 or lower indicates Brown risk for future opioid use disorder; 4 to 7 indicates moderate risk for opioid use disorder; 8 or higher indicates a high risk for opioid use disorder      REVIEW OF SYSTEMS      Review of Systems   Constitutional:         Denies pain   Respiratory:  Negative for shortness of breath.    Cardiovascular:  Negative for chest pain.   Gastrointestinal:  Negative for abdominal pain, nausea and vomiting.          Pain Assessment   []   Pain is an active problem for patient    Pain level: []  None []  Mild (1 - 3) []  Moderate (4 - 6) []  Severe (7 - 10) []  Other:     Pain is exacerbated by:  Pain is improved with:       FUNCTIONAL ASSESSMENTS      Palliative Performance Scale Assessed by me on: May 26, 2023    % AMBULATION  ACTIVITY DISEASE  SELF-CARE INTAKE CONSCIOUS LEVEL   []   100 Full Normal None Full Normal Full   []   90 Full Normal  Some  Full Normal Full   []   80 Full Normal with Effort Some  Full Normal or Reduced Full   []   70 Reduced Can't Perform  Normal Work Significant  Full Normal or Reduced Full   []   60 Reduced Can't do Hobbies  or Housework Significant  Occasional Assistance Normal or Reduced Full or Confusion   []   50 Mainly Sit/Lie No Job or Work Extensive  Considerable Assistance Normal or Reduced Full or Confusion   []   40 Mainly in Bed Little Activity Extensive  Mainly Assistance Normal or Reduced Full or Drowsy +/- Confusion   [x]   30 Bed Bound No activity Extensive  Total Care Reduced Full or Drowsy +/- Confusion   []   20 Bed Bound No activity Extensive  Total Care Minimal Full or Drowsy +/- Confusion   []   10 Bed Bound No activity Extensive  Total Care Mouth Care Drowsy or Coma   []   0 Death -- -- -- -- --    Prior PPS: 100%  Date: PTA      FAST Scale in Alzheimer's Dementia  []  Assessed by me on: May 26, 2023 or [x]  N/A    # LEVEL OF DISABILITY    []   1  No difficulty    []   2  Subjective work difficulties.    []   3  Decreased organization capacity.    []   4  Decreased ability to perform complex tasks    []   5  Requires assistance in choosing proper clothing    []   6a  Improperly putting on clothes without assistance or cuing    []   6b  Difficulty adjusting bath-water temperature    []   6c  Inability to handle the mechanics of toileting    []   6d  Urinary Incontinence    []   6e  Fecal Incontinence    []   7a  Able to speak 6 or fewer intelligible words in a 24 hr period or during an interview    []   7b  Able to only speak one intelligible word in a 24 hr period or during an interview    []   7c  Cannot walk without assistance    []   7d  Cannot sit up without assistance    []   7e  Loss of ability to smile    []   50f  Loss of ability to hold head up independently    Prior FAST: N/A  Date: N/A     ECOG Performance  Status  [x]  Assessed by me on: May 26, 2023 or []  N/A     # ECOG PERFORMANCE STATUS    []   0 Fully active, able to carry on all pre-disease performance without restriction    []  1 Restricted in physically strenuous activity but ambulatory and able to carry out work of a light or sedentary nature, e.g., light house work, office work    []   2 Ambulatory and capable of all selfcare but unable to carry out any work activities; up and about more than 50% of waking hours    []   3 Capable of only  limited selfcare; confined to bed or chair more than 50% of waking hours    [x]   4 Completely disabled; cannot carry on any selfcare; totally confined to bed or chair    []   5 Death    Prior ECOG: 0  Date: PTA      PHYSICAL ASSESSMENT     Vitals:    05/26/23 1654   BP: (!) 157/89   Pulse: 99   Resp: 18   Temp: 99.3 F (37.4 C)   SpO2: 99%     Body mass index is 20.81 kg/m.  Wt Readings from Last 3 Encounters:   05/25/23 65.8 kg (145 lb)   05/05/23 65.8 kg (145 lb)   09/09/22 68 kg (150 lb)         Last BM (including prior to admit):  (PTA)      Supplemental O2:  []  Yes  [x]  NO   Currently this patient has:   [x]  Peripheral IV  []  PICC  []  PORT []  AICD    []  Foley Catheter []  NG Tube  []  PEG Tube []  PPM    []  Rectal Tube  []  Drain []  Other:     Physical Exam  Vitals reviewed.   Constitutional:       General: He is awake. He is not in acute distress.     Appearance: Normal appearance. He is underweight. He is not toxic-appearing.   HENT:      Head: Normocephalic and atraumatic.      Mouth/Throat:      Lips: Pink.      Mouth: Mucous membranes are moist.   Cardiovascular:      Rate and Rhythm: Normal rate and regular rhythm.      Heart sounds: Normal heart sounds, S1 normal and S2 normal.   Pulmonary:      Effort: Pulmonary effort is normal. No tachypnea, bradypnea, accessory muscle usage or respiratory distress.      Breath sounds: Normal breath sounds.   Abdominal:      General: Abdomen is flat. Bowel sounds are normal. There is  no distension.      Palpations: Abdomen is soft.      Tenderness: There is no abdominal tenderness.   Musculoskeletal:      Right lower leg: No edema.      Left lower leg: No edema.   Skin:     General: Skin is warm and dry.      Capillary Refill: Capillary refill takes less than 2 seconds.   Neurological:      Mental Status: He is alert and oriented to person, place, and time.   Psychiatric:         Behavior: Behavior is cooperative.           TOTAL TIME SPENT E/M     [x]    The total E/M encounter time was 95 minutes on 05/26/2023, which was spent performing a face-to-face encounter and personally completing the provider-level activities documented in the note as checked below.. This includes time spent prior to the visit and after the visit in direct care of the patient. This time does not include time spent in any separately reportable services.    TIME SPENT:    [x]   Preparing to see patient: Review of tests, laboratory, imaging and other EHR medical records    [x]   Review of Prescription Monitoring Program (PMP)    [x]   Obtaining and/or reviewing separately obtained history    [x]   Performing  a medically appropriate examination and/or evaluation    [x]   Counseling and educating the patient/family/caregiver    []   Ordering: []  Medications []  Tests []  Procedures    [x]   Independently interpreting results    [x]   Care coordination by speaking with other health care professionals:     [x]  Physicians  []  Nursing  []  SLP/PT/OT  []  RD  []  Care Mgmt  []  [facility] and Supportive Care IDT, Hospice IDT    [x]   Documenting clinical information in the electronic or other health record     [x]  ELECTRONICALLY SIGNED 05/26/2023 at 5:49 PM      Dictation disclaimer: Additionally, portions of this document may have been dictated using Scientist, clinical (histocompatibility and immunogenetics). Variances in spelling and vocabulary are possible and unintentional. Every attempt is made to minimize errors and discrepancies. Please notify the [facility] Parkin if  any discrepancies are noted or if the meaning of any statement is not clear     Everlene Other, APRN - NP  Palliative Care  o. [phone]

## 2023-05-26 NOTE — Plan of Care (Signed)
 Problem: ABCDS Injury Assessment  Goal: Absence of physical injury  Outcome: Progressing     Problem: Pain  Goal: Verbalizes/displays adequate comfort level or baseline comfort level  Outcome: Progressing     Problem: Safety - Adult  Goal: Free from fall injury  Outcome: Progressing

## 2023-05-26 NOTE — Consults (Signed)
 [facility]  [facility]  Consult Note    Patient: Tommy Brown Age: 78 y.o. Sex: male    Date of Birth: 03/26/1945 Admit Date: 05/25/2023 PCP: Cleatis Polka, MD   MRN: [id number]  CSN: [id number]        Requesting physician: Dr. Willette Alma  Outpatient cardiologist: [facility] - Dr. Avon Gully last seen 05/2022    Tommy Brown is a 78 y.o. male who is being seen on consult for CV clearance.    Impression:     Fall  - Patient presented to [facility] ER 05/25/2023 with c/o fall day prior when going to bathroom. Endorsed R hip pain.   - WBC 14.6   R hip fracture   - R femur Xray 05/25/2023: Minimally displaced intertrochanteric right femoral neck fracture.   CAD   -s/p CABG x 3 in 1990's in [facility] medical center   - hs troponin 22 >25  Moderate Aortic stenosis  HTN  BPH  Hx of pancreatic cancer   - s/p Whipple  Diverticular disease  Hyponatremia   - Na 131 > 135   Hypomagnesemia   - Mg 1.5       Cardiac testing:   NST 06/2019: no evidence of reversible ischemia, low risk.   Echo 05/09/2022: EF 55%. Mod AS, peak 2.6 m/s, mean 15 m Hg, AVA 0.9 cm2, DI 0.30, mild MR, RVSP 22 mm Hg. Normal LVSVi  Echo 05/20/2022: EF 49% . Mod AS.peak gradient of , mean gradient of 14 mmHg, AVA of 0.54cm2, and Dimensionless index 0.33.      Recommendations:     Preoperative CV clearance  - update echo with hx of mod AS. Final clearance per CV attending   - RCIS score is 1.  - 6% risk of major CV event.     Final recommendations per cardiology attending    Thank you for the consult; We will follow    HPI:  Pt with PMHx as noted above presented to ED with c/o fall. Patient reportedly complaining of recent abdominal pain. He reported to be using bathroom on 05/24/2023 when he had fall w/o LOC. He endorsed R hip pain. In ER X-ray noted for R femoral neck fracture. Cardiology was consulted for CV clearance. At present patient is in bed resting in NAD. He states he has been well from CV perspective. He  denies any DOE and chest pain.     Chief Complaint   Patient presents with    Altered Mental Status    Fall       CXR:  XR CHEST PORTABLE 05/25/2023    Narrative  Exam: AP portable chest    Clinical indication: sepsis    Comparison: 09/25/2020;    Results:  No consolidation.  No pleural effusions.  Sternotomy wires. Postop changes cervical spine.  No pneumothorax.    Heart normal.  Mediastinal contours are normal.    No free air is seen under the hemidiaphragms.   Osseous structures intact.    Impression  IMPRESSION:  No acute disease.    Electronically signed by: Earline Mayotte, MD 05/25/2023 10:12 PM EDT  Workstation ID: [id number]         EKG:  Personal Review Sinus tachycardia with PAC's LAD HR 103 QtcB 442     Admission diagnosis: Encephalopathy  Patient Active Problem List   Diagnosis    UTI (urinary tract infection)    History of syncope    AKI (acute kidney injury)  Hypertensive retinopathy, bilateral    Hx of CABG    Intraductal papillary mucinous neoplasm    Vitreous degeneration, left eye    Clostridium difficile diarrhea    Syncope    Atherosclerosis of coronary artery    Chest pain    H/O neck surgery    Palpitations    Bacteremia due to Klebsiella pneumoniae    Essential hypertension    Coronary artery disease (CAD) excluded    Pneumonia    Testosterone deficiency    Cervical disc disease    Port-A-Cath in place    AR (allergic rhinitis)    Acute chest pain    Metabolic acidosis    Murmur, cardiac    Sepsis (HCC)    Liver cyst    BPH (benign prostatic hyperplasia)    Carotid artery disease    Seasonal allergies    Malignant neoplasm of head of pancreas (HCC)    Hypercholesteremia    Puckering of macula, right eye    Tributary (branch) retinal vein occlusion, right eye, stable (HCC)    Dyslipidemia    Ejection fraction < 50%    Diarrhea    Recurrent nephrolithiasis    Preop cardiovascular exam    Gout    Steatorrhea    Pneumonia due to COVID-19 virus    History of coronary artery disease    Rectal  bleeding    Lower GI bleed    Intractable nausea and vomiting    Encephalopathy    Closed intertrochanteric fracture of hip, right, initial encounter [facility])       Past Medical History:   Diagnosis Date    Arthritis     Benign prostate hyperplasia     Bladder infection     Chronic kidney disease     Coronary arteriosclerosis     Coronary atherosclerosis of artery bypass graft     Decreased testosterone level     Diverticular disease     Essential hypertension     Gout     History of acute renal failure     History of anemia     History of malignant neoplasm of pancreas     Hx of CABG     Kidney stone     Kidney stones     Neuropathy     NECK, SHOULDERS    Pancreatic cancer (HCC)     Sepsis (HCC)      Past Surgical History:   Procedure Laterality Date    CABG, ARTERY-VEIN, FOUR  1995    CAPSULE ENDOSCOPY N/A 05/23/2022    BOWEL SMALL CAPSULE ENDOSCOPY performed by Franky Macho, MD at [facility] ENDOSCOPY    CARDIAC CATHETERIZATION  2006    CERVICAL FUSION      COLONOSCOPY N/A 07/10/2020    DIAGNOSTIC COLONOSCOPY polypectomy w/hot snare, cold snare , Clip x 1  performed by Jonna Coup, MD at [facility] ENDOSCOPY    COLONOSCOPY      COLONOSCOPY N/A 05/17/2022    COLONOSCOPY DIAGNOSTIC aborted at 25cm performed by Jonna Coup, MD at [facility] ENDOSCOPY    CORONARY ARTERY BYPASS GRAFT  1995    LUMBAR DISCECTOMY      OTHER SURGICAL HISTORY  01/01/2011    whipple procedure done for pancreatic cancer    UPPER GASTROINTESTINAL ENDOSCOPY N/A 05/17/2022    ESOPHAGOGASTRODUODENOSCOPY DIAGNOSTIC ONLY performed by Jonna Coup, MD at [facility] ENDOSCOPY    UROLOGICAL SURGERY  2012    Percutaneous extraction of a kidney stone  w/ fragmentation procedure      Social History     Socioeconomic History    Marital status: Married     Spouse name: Not on file    Number of children: Not on file    Years of education: Not on file    Highest education level: Not on file   Occupational History    Not on file   Tobacco Use    Smoking status: Never      Passive exposure: Never    Smokeless tobacco: Never   Vaping Use    Vaping status: Never Used   Substance and Sexual Activity    Alcohol use: Never    Drug use: Never    Sexual activity: Defer   Other Topics Concern    Not on file   Social History Narrative    ** Merged History Encounter **         ** Merged History Encounter **        Social Drivers of Psychologist, prison and probation services Strain: Not on file   Food Insecurity: No Food Insecurity (05/26/2023)    Hunger Vital Sign     Worried About Running Out of Food in the Last Year: Never true     Ran Out of Food in the Last Year: Never true   Transportation Needs: No Transportation Needs (05/26/2023)    PRAPARE - Therapist, art (Medical): No     Lack of Transportation (Non-Medical): No   Physical Activity: Not on file   Stress: Not on file   Social Connections: Not on file   Intimate Partner Violence: Not on file   Housing Stability: Low Risk  (05/26/2023)    Housing Stability Vital Sign     Unable to Pay for Housing in the Last Year: No     Number of Times Moved in the Last Year: 0     Homeless in the Last Year: No     Family History   Problem Relation Age of Onset    Emphysema Brother     Alcohol Abuse Father     Heart Disease Brother        Allergies   Allergen Reactions    Codeine Itching and Swelling    Oxycodone Anaphylaxis    Amoxicillin Diarrhea    Hydroxyzine Pamoate Itching and Other (See Comments)     Other reaction(s): Unknown    Meperidine Itching, Other (See Comments), Palpitations and Swelling     Other reaction(s): Unknown    Metoclopramide Itching, Other (See Comments) and Swelling     Other reaction(s): Other (See Comments)  Reaction unknown   Reaction unknown     Morphine Nausea And Vomiting and Other (See Comments)    Oxycodone-Acetaminophen Itching    Prochlorperazine Itching and Other (See Comments)     Other reaction(s): Unknown (comments)  Other reaction(s): Unknown  Unsure of reaction    Amoxicillin Diarrhea    Codeine  Itching and Swelling    Hydroxyzine Hcl Hives    Hydroxyzine Pamoate Itching    Metoclopramide Itching and Swelling    Other Other (See Comments)     VISTAID  ANESTHESIA    Other      VISTAID ANESTHESIA    Oxycodone-Acetaminophen Itching    Prochlorperazine Itching    Meperidine Itching, Swelling and Palpitations    Morphine Nausea And Vomiting       Home Medications:     Prior  to Admission medications    Medication Sig Start Date End Date Taking? Authorizing Provider   gabapentin (NEURONTIN) 600 MG tablet Take 1 tablet by mouth 3 times daily.   Yes [provider]   aspirin 81 MG EC tablet Take 1 tablet by mouth daily   Yes [provider]   acetaminophen (TYLENOL) 325 MG tablet Take 2 tablets by mouth every 6 hours as needed   Yes [provider]   amLODIPine (NORVASC) 2.5 MG tablet Take 1 tablet by mouth daily 09/12/22   Rosemary Holms, MD   metoprolol tartrate (LOPRESSOR) 25 MG tablet Take 1 tablet by mouth 2 times daily 09/11/22   Rosemary Holms, MD   pravastatin (PRAVACHOL) 10 MG tablet Take 1 tablet by mouth nightly 09/11/22   Rosemary Holms, MD   bimatoprost (LUMIGAN) 0.01 % SOLN ophthalmic drops Place 1 drop into the right eye nightly    [provider]   timolol (BETIMOL) 0.5 % ophthalmic solution Place 1 drop into the left eye at bedtime    [provider]   ferrous sulfate (IRON 325) 325 (65 Fe) MG tablet Take 1 tablet by mouth daily (with breakfast)    [provider]   lipase-protease-amylase (CREON) 24000-76000 units delayed release capsule Take 1 capsule by mouth 3 times daily (with meals) And with snacks    [provider]   allopurinol (ZYLOPRIM) 300 MG tablet Take 1 tablet by mouth daily    [provider]   tamsulosin (FLOMAX) 0.4 MG capsule Take 1 capsule by mouth in the morning and at bedtime    [provider]   omeprazole (PRILOSEC) 20 MG delayed release capsule Take 2 capsules by mouth daily For gerds    [provider]   Multiple Vitamins-Minerals (THERAPEUTIC MULTIVITAMIN-MINERALS) tablet Take 1 tablet by mouth daily Centrum Silver for men    [provider]   magnesium oxide (MAG-OX) 400 (240 Mg) MG tablet Take 1 tablet by mouth daily    [provider]   testosterone cypionate (DEPOTESTOTERONE CYPIONATE) 200 MG/ML injection INJECT 0.5 MILLILITER BY INTRAMUSCULAR ROUTE EVERY 2 WEEKS 07/14/22   [provider]   Omega-3 Fatty Acids-Hemp Extra (HEMP MONOPURE PO) Take by mouth    [provider]   aspirin 81 MG chewable tablet Take 1 tablet by mouth daily RESUME THIS AFTER ONE WEEK, ONLY IF NO MORE BLEEDING IN BOWEL MOVEMENTS 05/23/22   Simoes, Ruchita, MD   omeprazole (PRILOSEC) 40 MG delayed release capsule Take 1 capsule by mouth daily    [provider]   allopurinol (ZYLOPRIM) 300 MG tablet Take 1 tablet by mouth daily    Automatic Reconciliation, Ar   gabapentin (NEURONTIN) 600 MG tablet Take 2 tablets by mouth 4 times daily.    Automatic Reconciliation, Ar   tamsulosin (FLOMAX) 0.4 MG capsule Take 2 capsules by mouth daily 07/17/18   Automatic Reconciliation, Ar   temazepam (RESTORIL) 15 MG capsule Take 1 capsule by mouth nightly as needed for Anxiety or Sleep.    Automatic Reconciliation, Ar       Inpt meds:   gabapentin  600 mg Oral 4x daily    heparin (porcine)  5,000 Units SubCUTAneous BID    timolol  1 drop Left Eye Nightly    allopurinol  300 mg Oral Daily    ferrous sulfate  325 mg Oral Daily with breakfast    tamsulosin  0.8 mg Oral Daily    aspirin  81 mg Oral Daily    omeprazole  40 mg Oral Daily    therapeutic multivitamin-minerals  1 tablet Oral Daily    magnesium oxide  400 mg Oral Daily        Review of Systems:     Positives in bold    Constitutional: fever, chills, weight loss, weakness, fatigue , fall  Eyes: blurred vision, double vision  ENT: sore throat, congestion, ear pain  Respiratory: cough, shortness of breath, orthopnea, PND,  wheezing  Cardiovascular: chest pain/pressure, palpitations, syncope  Gastrointestinal: abdominal pain, nausea, vomiting, diarrhea, melena, hematochezia, hematemesis  Genitourinary: painful urination, frequency, urgency  Musculoskeletal: joint pain, swelling, R hip pain  Integumentary: rashes, wounds  Endocrine: increased polydipsia, polyuria, polyphagia, cold/heat intolerance  Neurological: headache, dizziness, extremity weakness, paresthesias  Psychological: depression, anxiety, substance abuse    Physical Assessment:     BP (!) 161/77   Pulse 84   Temp 97.2 F (36.2 C) (Temporal)   Resp 18   Ht 1.778 m (5\' 10" )   Wt 65.8 kg (145 lb)   SpO2 96%   BMI 20.81 kg/m     GEN: NAD, conversant, well nourished, well developed  HEENT:  conjunctiva not injected, sclera anicteric, PERRL  Neck: No JVD, supple, trachea midline  Resp: Clear to auscultation bilaterally; No wheezes or rales  CV: regular rhythm normal s1and s2; No murmur or rubs  Abd: Soft, Nontender; ND  Ext: No clubbing, cyanosis or edema  Neuro: Alert and oriented, moves all four extremities  Skin: Warm, Dry, well perfused  Psyc: mood is good, appropriate affect    Labs:    GFR: Estimated Creatinine Clearance: 63 mL/min (based on SCr of 0.88 mg/dL).     Lab Results   Component Value Date/Time    WBC 13.0 (H) 05/26/2023 01:08 AM    RBC 3.64 (L) 05/26/2023 01:08 AM    HGB 10.9 (L) 05/26/2023 01:08 AM    HCT 34.3 (L) 05/26/2023 01:08 AM    MCV 94.2 05/26/2023 01:08 AM    MCH 29.9 05/26/2023 01:08 AM    MCHC 31.8 05/26/2023 01:08 AM    RDW 49.0 (H) 05/26/2023 01:08 AM    PLT 245 05/26/2023 01:08 AM    MPV 10.8 (H) 05/26/2023 01:08 AM       Lab Results   Component Value Date/Time    MONOS 8.1 05/26/2023 01:08 AM    BASOS 0.1 05/26/2023 01:08 AM       Lab Results   Component Value Date    NA 135 (L) 05/26/2023    K 3.8 05/26/2023    CL 103 05/26/2023    CO2 19 (L) 05/26/2023    BUN 12 05/26/2023    MG 1.5 (L) 05/26/2023    PHOS 3.2 05/12/2022        Lab  Results   Component Value Date/Time    BNP 114.0 07/21/2019 08:42 AM        Lab Results   Component Value Date/Time    TSH 1.401 09/24/2020 12:52 PM        Lab Results   Component Value Date    INR 1.3 (H) 05/25/2023       No results found for: "HBA1C", "HBA1CPOC"      Lab Results   Component Value Date/Time    CHOL 149 09/09/2022 08:35 AM    HDL 44 09/09/2022 08:35 AM    LDL 85 09/09/2022 08:35 AM    LDL 56 07/22/2019 07:23 AM  Lab Results   Component Value Date    ALT 15 05/25/2023          I have personally and independently reviewed all imaging, diagnostic medical testing, and laboratory data to date.  I have integrated these findings into my Assessment and Plan outlined above.    Roger Kill, PA-C  [facility]  [facility]  May 26, 2023 1:26 PM

## 2023-05-26 NOTE — Progress Notes (Addendum)
 Right femur xray result reviewed  Right hip fracture   Discussed with wife and patient  Ortho consulted appreciate Dr Renaldo Harrison  Npo     Xray Result (most recent):  XR FEMUR RIGHT (MIN 2 VIEWS) 05/25/2023    Narrative  EXAMINATION: XR FEMUR RIGHT (MIN 2 VIEWS)    CLINICAL INDICATION: Injury/Pain    COMPARISON:  None.    TECHNIQUE: 2 views right femur    FINDINGS:  Minimally displaced intertrochanteric right femoral neck fracture.    Impression  IMPRESSION:  Minimally displaced intertrochanteric right femoral neck fracture.      Electronically signed by: Earline Mayotte, MD 05/25/2023 11:52 PM EDT  Workstation ID: [id number]

## 2023-05-27 ENCOUNTER — Inpatient Hospital Stay: Admit: 2023-05-27 | Payer: MEDICARE | Primary: Family Medicine

## 2023-05-27 LAB — ECHOCARDIOGRAM COMPLETE 2D W DOPPLER W COLOR: Left Ventricular Ejection Fraction: 45

## 2023-05-27 LAB — ANTIBODY SCREEN: Antibody Screen: NEGATIVE

## 2023-05-27 LAB — MAGNESIUM: Magnesium: 2 mg/dL (ref 1.6–2.6)

## 2023-05-27 LAB — ABO/RH: ABO/Rh: A NEG

## 2023-05-27 LAB — CULTURE, BLOOD FOR MRSA/SA BY PCR
Blood culture S. aureus by PCR: NEGATIVE
Blood culture, MRSA by PCR: NEGATIVE

## 2023-05-27 MED ORDER — HYDRALAZINE HCL 20 MG/ML IJ SOLN
20 | INTRAMUSCULAR | Status: DC | PRN
Start: 2023-05-27 — End: 2023-05-27

## 2023-05-27 MED ORDER — ONDANSETRON HCL 4 MG/2ML IJ SOLN
4 MG/2ML | Freq: Four times a day (QID) | INTRAMUSCULAR | Status: DC | PRN
Start: 2023-05-27 — End: 2023-05-30

## 2023-05-27 MED ORDER — FENTANYL CITRATE (PF) 100 MCG/2ML IJ SOLN
100 | INTRAMUSCULAR | Status: AC
Start: 2023-05-27 — End: ?

## 2023-05-27 MED ORDER — FENTANYL CITRATE (PF) 100 MCG/2ML IJ SOLN
100 | INTRAMUSCULAR | Status: DC | PRN
Start: 2023-05-27 — End: 2023-05-27
  Administered 2023-05-27 (×2): 25 ug via INTRAVENOUS

## 2023-05-27 MED ORDER — ONDANSETRON HCL 4 MG/2ML IJ SOLN
4 | Freq: Once | INTRAMUSCULAR | Status: DC | PRN
Start: 2023-05-27 — End: 2023-05-27
  Administered 2023-05-27: 21:00:00 4 via INTRAVENOUS

## 2023-05-27 MED ORDER — NORMAL SALINE FLUSH 0.9 % IV SOLN
0.9 % | Freq: Two times a day (BID) | INTRAVENOUS | Status: DC
Start: 2023-05-27 — End: 2023-05-30
  Administered 2023-05-28 – 2023-05-30 (×5): 10 mL via INTRAVENOUS

## 2023-05-27 MED ORDER — ONDANSETRON HCL 4 MG/2ML IJ SOLN
4 | Freq: Once | INTRAMUSCULAR | Status: DC | PRN
Start: 2023-05-27 — End: 2023-05-27

## 2023-05-27 MED ORDER — ONDANSETRON 4 MG PO TBDP
4 MG | Freq: Three times a day (TID) | ORAL | Status: DC | PRN
Start: 2023-05-27 — End: 2023-05-30

## 2023-05-27 MED ORDER — NORMAL SALINE FLUSH 0.9 % IV SOLN
0.9 | Freq: Two times a day (BID) | INTRAVENOUS | Status: DC
Start: 2023-05-27 — End: 2023-05-27

## 2023-05-27 MED ORDER — NALOXONE HCL 0.4 MG/ML IJ SOLN
0.4 | INTRAMUSCULAR | Status: DC | PRN
Start: 2023-05-27 — End: 2023-05-27

## 2023-05-27 MED ORDER — ONDANSETRON HCL 4 MG/2ML IJ SOLN
4 | INTRAMUSCULAR | Status: AC
Start: 2023-05-27 — End: ?

## 2023-05-27 MED ORDER — SODIUM CHLORIDE 0.9 % IV SOLN
0.9 % | INTRAVENOUS | Status: DC | PRN
Start: 2023-05-27 — End: 2023-05-30

## 2023-05-27 MED ORDER — SODIUM CHLORIDE 0.9 % IV SOLN
0.9 | INTRAVENOUS | Status: DC | PRN
Start: 2023-05-27 — End: 2023-05-27

## 2023-05-27 MED ORDER — SENNA-DOCUSATE SODIUM 8.6-50 MG PO TABS
8.6-50 | Freq: Two times a day (BID) | ORAL | Status: DC
Start: 2023-05-27 — End: 2023-05-29
  Administered 2023-05-28 – 2023-05-29 (×4): 1 via ORAL

## 2023-05-27 MED ORDER — TRAMADOL HCL 50 MG PO TABS
50 MG | Freq: Four times a day (QID) | ORAL | Status: DC | PRN
Start: 2023-05-27 — End: 2023-05-30
  Administered 2023-05-28 – 2023-05-30 (×5): 50 mg via ORAL

## 2023-05-27 MED ORDER — DEXMEDETOMIDINE HCL 200 MCG/2ML IV SOLN
200 | Freq: Once | INTRAVENOUS | Status: DC | PRN
Start: 2023-05-27 — End: 2023-05-27
  Administered 2023-05-27: 21:00:00 4 via INTRAVENOUS

## 2023-05-27 MED ORDER — PROPOFOL 200 MG/20ML IV EMUL
200 | INTRAVENOUS | Status: AC
Start: 2023-05-27 — End: ?

## 2023-05-27 MED ORDER — LACTATED RINGERS IV SOLN
INTRAVENOUS | Status: DC
Start: 2023-05-27 — End: 2023-05-27

## 2023-05-27 MED ORDER — DEXAMETHASONE SODIUM PHOSPHATE 20 MG/5ML IJ SOLN
20 | INTRAMUSCULAR | Status: AC
Start: 2023-05-27 — End: ?

## 2023-05-27 MED ORDER — LIDOCAINE HCL (PF) 2 % IJ SOLN
2 | INTRAMUSCULAR | Status: AC
Start: 2023-05-27 — End: ?

## 2023-05-27 MED ORDER — CEFAZOLIN SODIUM-DEXTROSE 2-4 GM/100ML-% IV SOLN
2-4 | Freq: Three times a day (TID) | INTRAVENOUS | Status: AC
Start: 2023-05-27 — End: 2023-05-28
  Administered 2023-05-28 (×2): 2000 mg via INTRAVENOUS

## 2023-05-27 MED ORDER — LIDOCAINE HCL (PF) 1 % IJ SOLN
1 | Freq: Once | INTRAMUSCULAR | Status: DC | PRN
Start: 2023-05-27 — End: 2023-05-27

## 2023-05-27 MED ORDER — LIDOCAINE HCL (PF) 2 % IJ SOLN
2 | Freq: Once | INTRAMUSCULAR | Status: DC | PRN
Start: 2023-05-27 — End: 2023-05-27
  Administered 2023-05-27: 21:00:00 80 via INTRAVENOUS

## 2023-05-27 MED ORDER — NORMAL SALINE FLUSH 0.9 % IV SOLN
0.9 % | INTRAVENOUS | Status: DC | PRN
Start: 2023-05-27 — End: 2023-05-30

## 2023-05-27 MED ORDER — POLYETHYLENE GLYCOL 3350 17 G PO PACK
17 g | Freq: Every day | ORAL | Status: DC | PRN
Start: 2023-05-27 — End: 2023-05-30

## 2023-05-27 MED ORDER — NORMAL SALINE FLUSH 0.9 % IV SOLN
0.9 | INTRAVENOUS | Status: DC | PRN
Start: 2023-05-27 — End: 2023-05-27

## 2023-05-27 MED ORDER — BISACODYL 5 MG PO TBEC
5 MG | Freq: Every day | ORAL | Status: DC | PRN
Start: 2023-05-27 — End: 2023-05-30

## 2023-05-27 MED ORDER — SODIUM CHLORIDE 0.9 % IV SOLN
0.9 | INTRAVENOUS | Status: DC | PRN
Start: 2023-05-27 — End: 2023-05-27
  Administered 2023-05-27 (×2): 200 via INTRAVENOUS
  Administered 2023-05-27: 21:00:00 100 via INTRAVENOUS

## 2023-05-27 MED ORDER — CEFAZOLIN SODIUM 1 G IJ SOLR
1 | Freq: Once | INTRAMUSCULAR | Status: DC | PRN
Start: 2023-05-27 — End: 2023-05-27
  Administered 2023-05-27: 21:00:00 2 via INTRAVENOUS

## 2023-05-27 MED ORDER — PROPOFOL 200 MG/20ML IV EMUL
200 | Freq: Once | INTRAVENOUS | Status: DC | PRN
Start: 2023-05-27 — End: 2023-05-27
  Administered 2023-05-27: 21:00:00 50 via INTRAVENOUS

## 2023-05-27 MED ORDER — DEXAMETHASONE SODIUM PHOSPHATE 20 MG/5ML IJ SOLN
20 | Freq: Once | INTRAMUSCULAR | Status: DC | PRN
Start: 2023-05-27 — End: 2023-05-27
  Administered 2023-05-27: 21:00:00 8 via INTRAVENOUS

## 2023-05-27 MED ORDER — FENTANYL CITRATE (PF) 100 MCG/2ML IJ SOLN
100 | Freq: Once | INTRAMUSCULAR | Status: DC | PRN
Start: 2023-05-27 — End: 2023-05-27
  Administered 2023-05-27 (×2): 50 via INTRAVENOUS

## 2023-05-27 MED ORDER — HYDROMORPHONE HCL 1 MG/ML IJ SOLN
1 MG/ML | INTRAMUSCULAR | Status: DC | PRN
Start: 2023-05-27 — End: 2023-05-30

## 2023-05-27 MED ORDER — LACTATED RINGERS IV SOLN
INTRAVENOUS | Status: DC
Start: 2023-05-27 — End: 2023-05-30
  Administered 2023-05-27 (×2): via INTRAVENOUS

## 2023-05-27 MED FILL — HEPARIN SODIUM (PORCINE) 5000 UNIT/ML IJ SOLN: 5000 UNIT/ML | INTRAMUSCULAR | Qty: 1 | Fill #0

## 2023-05-27 MED FILL — DIPRIVAN 200 MG/20ML IV EMUL: 200 MG/20ML | INTRAVENOUS | Qty: 20 | Fill #0

## 2023-05-27 MED FILL — TAMSULOSIN HCL 0.4 MG PO CAPS: 0.4 MG | ORAL | Qty: 2 | Fill #0

## 2023-05-27 MED FILL — KETOROLAC TROMETHAMINE 15 MG/ML IJ SOLN: 15 MG/ML | INTRAMUSCULAR | Qty: 1 | Fill #0

## 2023-05-27 MED FILL — GABAPENTIN 300 MG PO CAPS: 300 MG | ORAL | Qty: 2 | Fill #0

## 2023-05-27 MED FILL — OMEPRAZOLE 20 MG PO CPDR: 20 MG | ORAL | Qty: 2 | Fill #0

## 2023-05-27 MED FILL — FENTANYL CITRATE (PF) 100 MCG/2ML IJ SOLN: 100 MCG/2ML | INTRAMUSCULAR | Qty: 2 | Fill #0

## 2023-05-27 MED FILL — ASPIRIN 81 MG PO CHEW: 81 MG | ORAL | Qty: 1 | Fill #0

## 2023-05-27 MED FILL — FEROSUL 325 (65 FE) MG PO TABS: 325 (65 Fe) MG | ORAL | Qty: 1 | Fill #0

## 2023-05-27 MED FILL — METHOCARBAMOL 500 MG PO TABS: 500 MG | ORAL | Qty: 1 | Fill #0

## 2023-05-27 MED FILL — XYLOCAINE-MPF 2 % IJ SOLN: 2 % | INTRAMUSCULAR | Qty: 5 | Fill #0

## 2023-05-27 MED FILL — DEXAMETHASONE SODIUM PHOSPHATE 20 MG/5ML IJ SOLN: 20 MG/5ML | INTRAMUSCULAR | Qty: 5 | Fill #0

## 2023-05-27 MED FILL — LACTATED RINGERS IV SOLN: INTRAVENOUS | Qty: 1000 | Fill #0

## 2023-05-27 MED FILL — ONDANSETRON HCL 4 MG/2ML IJ SOLN: 4 MG/2ML | INTRAMUSCULAR | Qty: 2 | Fill #0

## 2023-05-27 MED FILL — ACETAMINOPHEN 10 MG/ML IV SOLN: 10 MG/ML | INTRAVENOUS | Qty: 100 | Fill #0

## 2023-05-27 MED FILL — THERAPEUTIC-M PO TABS: ORAL | Qty: 1 | Fill #0

## 2023-05-27 MED FILL — MAGNESIUM OXIDE -MG SUPPLEMENT 400 (240 MG) MG PO TABS: 400 (240 Mg) MG | ORAL | Qty: 1 | Fill #0

## 2023-05-27 MED FILL — ALLOPURINOL 300 MG PO TABS: 300 MG | ORAL | Qty: 1 | Fill #0

## 2023-05-27 NOTE — Progress Notes (Signed)
 [facility]    PROGRESS NOTE    Patient seen in follow-up for:   Preoperative risk assessment, coronary disease, aortic stenosis    PLAN:  Hip fracture: To the OR today.  CAD: Stable, no angina  Aortic stenosis: Echocardiogram showed moderate to severe arctic stenosis.  Will address in the outpatient setting.    ASSESSMENT:  Tommy Brown is a 78 y.o. male with:  Fall  - Patient presented to [facility] ER 05/25/2023 with c/o fall day prior when going to bathroom. Endorsed R hip pain.   - WBC 14.6   R hip fracture   - R femur Xray 05/25/2023: Minimally displaced intertrochanteric right femoral neck fracture.   CAD              -s/p CABG x 3 in 1990's in [facility] center              - hs troponin 22 >25  Moderate Aortic stenosis  HTN  BPH  Hx of pancreatic cancer              - s/p Whipple  Diverticular disease  Hyponatremia              - Na 131 > 135   Hypomagnesemia              - Mg 1.5         Cardiac testing:   NST 06/2019: no evidence of reversible ischemia, low risk.   Echo 05/09/2022: EF 55%. Mod AS, peak 2.6 m/s, mean 15 m Hg, AVA 0.9 cm2, DI 0.30, mild MR, RVSP 22 mm Hg. Normal LVSVi  Echo 05/20/2022: EF 49% . Mod AS.peak gradient of , mean gradient of 14 mmHg, AVA of 0.54cm2, and Dimensionless index 0.33.      Principal Problem:    Encephalopathy  Active Problems:    Closed intertrochanteric fracture of hip, right, initial encounter  Resolved Problems:    * No resolved hospital problems. *      SUBJECTIVE:  No CP or SOB pain is marginally controlled.    Hospital Problems           Last Modified POA    * (Principal) Encephalopathy 05/25/2023 Yes    Closed intertrochanteric fracture of hip, right, initial encounter 05/26/2023 Yes       VS: Blood pressure (!) 149/75, pulse 73, temperature 98.8 F (37.1 C), temperature source Temporal, resp. rate 18, height 1.778 m (5\' 10" ), weight 65.8 kg (145 lb), SpO2 97%.  Wt Readings from Last 3 Encounters:   05/25/23 65.8 kg (145 lb)    05/05/23 65.8 kg (145 lb)   09/09/22 68 kg (150 lb)      Body mass index is 20.81 kg/m.     Intake/Output Summary (Last 24 hours) at 05/27/2023 0944  Last data filed at 05/27/2023 0438  Gross per 24 hour   Intake 240 ml   Output 1150 ml   Net -910 ml       TELE personally reviewed by me: Sinus rhythm    EXAM:  General:  No acute distress  Eyes: EOMI  Neck: JVD is absent  Lungs: clear, no rales, no rhonchi   Cardiac:  Regular Rhythm, 3/6 mid to late peaking SEM  Abdomen: soft, non-tender,  Ext: Edema is absent  Neuro: Awake and interactive    Labs:  CMP:   Recent Labs     05/25/23  2026 05/26/23  0108   NA  131* 135*   K 5.1 3.8   CL 96* 103   CO2 24 19*   BUN 13 12   CREATININE 1.10 0.88   GLUCOSE 128* 98   CALCIUM 9.0 7.2*   BILITOT 0.80  --    ALKPHOS 148*  --    AST 27.0  --    ALT 15  --         CBC:  Recent Labs     05/25/23  2026 05/26/23  0108   WBC 14.6* 13.0*   RBC 4.33 3.64*   HGB 12.9* 10.9*   HCT 40.4 34.3*   MCV 93.3 94.2   MCH 29.8 29.9   MCHC 31.9 31.8   RDW 49.0* 49.0*   PLT 285 245   MPV 10.4* 10.8*        HS-Troponin:  Lab Results   Component Value Date/Time    TROPHS 25 05/25/2023 10:40 PM    TROPHS 22 05/25/2023 08:26 PM    TROPHS 16 09/09/2022 04:52 AM    TROPHS 14 09/09/2022 01:28 AM    TROPHS 16 09/08/2022 10:25 PM         Medications:  Current Facility-Administered Medications   Medication Dose Route Frequency Provider Last Rate Last Admin    gabapentin (NEURONTIN) capsule 600 mg  600 mg Oral 4x daily Matriano, Thamas Jaegers, MD   600 mg at 05/26/23 2114    senna (SENOKOT) tablet 8.6 mg  1 tablet Oral Daily PRN Matriano, Thamas Jaegers, MD        docusate sodium (COLACE) capsule 100 mg  100 mg Oral BID PRN Matriano, Thamas Jaegers, MD        ondansetron [facility]) injection 4 mg  4 mg IntraVENous Q6H PRN Matriano, Thamas Jaegers, MD        acetaminophen (TYLENOL) tablet 650 mg  650 mg Oral Q6H PRN Matriano, Thamas Jaegers, MD        melatonin tablet 3 mg  3 mg Oral Nightly PRN Matriano, Thamas Jaegers, MD         naloxone [facility]) injection 0.4 mg  0.4 mg IntraVENous PRN Matriano, Thamas Jaegers, MD        glucagon injection 1 mg  1 mg IntraMUSCular PRN Matriano, Thamas Jaegers, MD        dextrose 50 % IV solution  20-30 mL IntraVENous PRN Matriano, Thamas Jaegers, MD        acetaminophen (OFIRMEV) infusion 1,000 mg  1,000 mg IntraVENous Q6H PRN Matriano, Thamas Jaegers, MD   Stopped at 05/27/23 0351    heparin (porcine) injection 5,000 Units  5,000 Units SubCUTAneous BID Meryle Ready, MD   5,000 Units at 05/26/23 0844    ketorolac (TORADOL) injection 15 mg  15 mg IntraVENous Q6H PRN Meryle Ready, MD   15 mg at 05/27/23 0336    timolol (TIMOPTIC) 0.5 % ophthalmic solution 1 drop  1 drop Left Eye Nightly Matriano, Thamas Jaegers, MD   1 drop at 05/26/23 2120    allopurinol (ZYLOPRIM) tablet 300 mg  300 mg Oral Daily Meryle Ready, MD   300 mg at 05/26/23 0844    ferrous sulfate (IRON 325) tablet 325 mg  325 mg Oral Daily with breakfast Meryle Ready, MD   325 mg at 05/26/23 0831    tamsulosin (FLOMAX) capsule 0.8 mg  0.8 mg Oral Daily Matriano, Thamas Jaegers, MD  0.8 mg at 05/26/23 0844    methocarbamol (ROBAXIN) tablet 500 mg  500 mg Oral TID PRN Meryle Ready, MD   500 mg at 05/26/23 2114    aspirin chewable tablet 81 mg  81 mg Oral Daily Matriano, Thamas Jaegers, MD   81 mg at 05/26/23 5732    omeprazole (PRILOSEC) delayed release capsule 40 mg  40 mg Oral Daily Matriano, Thamas Jaegers, MD   40 mg at 05/26/23 0831    therapeutic multivitamin-minerals 1 tablet  1 tablet Oral Daily Matriano, Thamas Jaegers, MD   1 tablet at 05/26/23 2025    magnesium oxide (MAG-OX) tablet 400 mg  400 mg Oral Daily Meryle Ready, MD   400 mg at 05/26/23 4270        Kandis Mannan, [facility]  [facility]  [facility]    May 27, 2023  9:44 AM

## 2023-05-27 NOTE — Anesthesia Pre-Procedure Evaluation (Signed)
 Department of Anesthesiology  Preprocedure Note       Name:  Tommy Brown   Age:  78 y.o.  DOB:  1945-07-07                                          MRN:  [id number]         Date:  05/27/2023      Surgeon: Moishe Spice):  Arita Miss, MD    Procedure: Procedure(s):  TROCHANTERIC NAILING RIGHT HIP    Medications prior to admission:   Prior to Admission medications    Medication Sig Start Date End Date Taking? Authorizing Provider   gabapentin (NEURONTIN) 600 MG tablet Take 1 tablet by mouth 3 times daily.   Yes [provider]   aspirin 81 MG EC tablet Take 1 tablet by mouth daily   Yes [provider]   acetaminophen (TYLENOL) 325 MG tablet Take 2 tablets by mouth every 6 hours as needed   Yes [provider]   amLODIPine (NORVASC) 2.5 MG tablet Take 1 tablet by mouth daily 09/12/22   Rosemary Holms, MD   metoprolol tartrate (LOPRESSOR) 25 MG tablet Take 1 tablet by mouth 2 times daily 09/11/22   Rosemary Holms, MD   pravastatin (PRAVACHOL) 10 MG tablet Take 1 tablet by mouth nightly 09/11/22   Rosemary Holms, MD   bimatoprost (LUMIGAN) 0.01 % SOLN ophthalmic drops Place 1 drop into the right eye nightly    [provider]   timolol (BETIMOL) 0.5 % ophthalmic solution Place 1 drop into the left eye at bedtime    [provider]   ferrous sulfate (IRON 325) 325 (65 Fe) MG tablet Take 1 tablet by mouth daily (with breakfast)    [provider]   lipase-protease-amylase (CREON) 24000-76000 units delayed release capsule Take 1 capsule by mouth 3 times daily (with meals) And with snacks    [provider]   allopurinol (ZYLOPRIM) 300 MG tablet Take 1 tablet by mouth daily    [provider]   tamsulosin (FLOMAX) 0.4 MG capsule Take 1 capsule by mouth in the morning and at bedtime    [provider]   omeprazole (PRILOSEC) 20 MG delayed release capsule Take 2 capsules by mouth daily For gerds    [provider]   Multiple Vitamins-Minerals  (THERAPEUTIC MULTIVITAMIN-MINERALS) tablet Take 1 tablet by mouth daily Centrum Silver for men    [provider]   magnesium oxide (MAG-OX) 400 (240 Mg) MG tablet Take 1 tablet by mouth daily    [provider]   testosterone cypionate (DEPOTESTOTERONE CYPIONATE) 200 MG/ML injection INJECT 0.5 MILLILITER BY INTRAMUSCULAR ROUTE EVERY 2 WEEKS 07/14/22   [provider]   Omega-3 Fatty Acids-Hemp Extra (HEMP MONOPURE PO) Take by mouth    [provider]   aspirin 81 MG chewable tablet Take 1 tablet by mouth daily RESUME THIS AFTER ONE WEEK, ONLY IF NO MORE BLEEDING IN BOWEL MOVEMENTS 05/23/22   Simoes, Ruchita, MD   omeprazole (PRILOSEC) 40 MG delayed release capsule Take 1 capsule by mouth daily    [provider]   allopurinol (ZYLOPRIM) 300 MG tablet Take 1 tablet by mouth daily    Automatic Reconciliation, Ar   gabapentin (NEURONTIN) 600 MG tablet Take 2 tablets by mouth 4 times daily.    Automatic Reconciliation, Ar  tamsulosin (FLOMAX) 0.4 MG capsule Take 2 capsules by mouth daily 07/17/18   Automatic Reconciliation, Ar   temazepam (RESTORIL) 15 MG capsule Take 1 capsule by mouth nightly as needed for Anxiety or Sleep.    Automatic Reconciliation, Ar       Current medications:    Current Facility-Administered Medications   Medication Dose Route Frequency Provider Last Rate Last Admin    lactated ringers infusion   IntraVENous Continuous Arita Miss, MD        gabapentin (NEURONTIN) capsule 600 mg  600 mg Oral 4x daily Matriano, Thamas Jaegers, MD   300 mg at 05/27/23 1030    senna (SENOKOT) tablet 8.6 mg  1 tablet Oral Daily PRN Matriano, Thamas Jaegers, MD        docusate sodium (COLACE) capsule 100 mg  100 mg Oral BID PRN Matriano, Thamas Jaegers, MD        ondansetron [facility]) injection 4 mg  4 mg IntraVENous Q6H PRN Matriano, Thamas Jaegers, MD        acetaminophen (TYLENOL) tablet 650 mg  650 mg Oral Q6H PRN Matriano, Thamas Jaegers, MD        melatonin tablet 3 mg  3 mg Oral  Nightly PRN Matriano, Thamas Jaegers, MD        naloxone [facility]) injection 0.4 mg  0.4 mg IntraVENous PRN Matriano, Thamas Jaegers, MD        glucagon injection 1 mg  1 mg IntraMUSCular PRN Matriano, Thamas Jaegers, MD        dextrose 50 % IV solution  20-30 mL IntraVENous PRN Matriano, Thamas Jaegers, MD        acetaminophen (OFIRMEV) infusion 1,000 mg  1,000 mg IntraVENous Q6H PRN Matriano, Thamas Jaegers, MD   Stopped at 05/27/23 0351    heparin (porcine) injection 5,000 Units  5,000 Units SubCUTAneous BID Meryle Ready, MD   5,000 Units at 05/26/23 9629    ketorolac (TORADOL) injection 15 mg  15 mg IntraVENous Q6H PRN Meryle Ready, MD   15 mg at 05/27/23 0336    timolol (TIMOPTIC) 0.5 % ophthalmic solution 1 drop  1 drop Left Eye Nightly Matriano, Thamas Jaegers, MD   1 drop at 05/26/23 2120    allopurinol (ZYLOPRIM) tablet 300 mg  300 mg Oral Daily Meryle Ready, MD   300 mg at 05/27/23 1032    ferrous sulfate (IRON 325) tablet 325 mg  325 mg Oral Daily with breakfast Meryle Ready, MD   325 mg at 05/27/23 1040    tamsulosin (FLOMAX) capsule 0.8 mg  0.8 mg Oral Daily Meryle Ready, MD   0.8 mg at 05/27/23 1030    methocarbamol (ROBAXIN) tablet 500 mg  500 mg Oral TID PRN Meryle Ready, MD   500 mg at 05/26/23 2114    aspirin chewable tablet 81 mg  81 mg Oral Daily Meryle Ready, MD   81 mg at 05/26/23 5284    omeprazole (PRILOSEC) delayed release capsule 40 mg  40 mg Oral Daily Matriano, Thamas Jaegers, MD   40 mg at 05/27/23 1032    therapeutic multivitamin-minerals 1 tablet  1 tablet Oral Daily Matriano, Thamas Jaegers, MD   1 tablet at 05/27/23 1030    magnesium oxide (MAG-OX) tablet 400 mg  400 mg Oral Daily Matriano, Thamas Jaegers, MD   400  mg at 05/27/23 1030       Allergies:    Allergies   Allergen Reactions    Codeine Itching and Swelling    Oxycodone Anaphylaxis    Amoxicillin Diarrhea    Hydroxyzine Pamoate Itching and Other (See Comments)     Other reaction(s): Unknown     Meperidine Itching, Other (See Comments), Palpitations and Swelling     Other reaction(s): Unknown    Metoclopramide Itching, Other (See Comments) and Swelling     Other reaction(s): Other (See Comments)  Reaction unknown   Reaction unknown     Morphine Nausea And Vomiting and Other (See Comments)    Oxycodone-Acetaminophen Itching    Prochlorperazine Itching and Other (See Comments)     Other reaction(s): Unknown (comments)  Other reaction(s): Unknown  Unsure of reaction    Amoxicillin Diarrhea    Codeine Itching and Swelling    Hydroxyzine Hcl Hives    Hydroxyzine Pamoate Itching    Metoclopramide Itching and Swelling    Other Other (See Comments)     VISTAID  ANESTHESIA    Other      VISTAID ANESTHESIA    Oxycodone-Acetaminophen Itching    Prochlorperazine Itching    Meperidine Itching, Swelling and Palpitations    Morphine Nausea And Vomiting       Problem List:    Patient Active Problem List   Diagnosis Code    UTI (urinary tract infection) N39.0    History of syncope Z87.898    AKI (acute kidney injury) N17.9    Hypertensive retinopathy, bilateral H35.033    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20    Murmur, cardiac R01.1    Sepsis (HCC) A41.9    Liver cyst K76.89    BPH (benign prostatic hyperplasia) N40.0    Carotid artery disease I77.9    Seasonal allergies J30.2    Malignant neoplasm of head of pancreas (HCC) C25.0     Hypercholesteremia E78.00    Puckering of macula, 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    Tributary (branch) retinal vein occlusion, right eye, 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    Dyslipidemia E78.5    Ejection fraction < 50% [id number]    Diarrhea R19.7    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    Preop cardiovascular exam Z01.810    Gout M10.9    Steatorrhea K90.9    Pneumonia due to COVID-19 virus U07.1, J12.82    History of coronary artery disease Z86.79    Rectal bleeding K62.5    Lower GI bleed K92.2    Intractable nausea and vomiting R11.2    Encephalopathy G93.40    Closed intertrochanteric fracture of hip, right, initial encounter S72.141A       Past Medical History:        Diagnosis Date    Arthritis  Benign prostate hyperplasia     Bladder infection     Chronic kidney disease     Coronary arteriosclerosis     Coronary atherosclerosis of artery bypass graft     Decreased testosterone level     Diverticular disease     Essential hypertension     Gout     History of acute renal failure     History of anemia     History of malignant neoplasm of pancreas     Hx of CABG     Kidney stone     Kidney stones     Neuropathy     NECK, SHOULDERS    Pancreatic cancer (HCC)     Sepsis (HCC)        Past Surgical History:        Procedure Laterality Date    CABG, ARTERY-VEIN, FOUR  1995    CAPSULE ENDOSCOPY N/A 05/23/2022    BOWEL SMALL CAPSULE ENDOSCOPY performed by Franky Macho, MD at [facility] ENDOSCOPY    CARDIAC CATHETERIZATION  2006    CERVICAL FUSION      COLONOSCOPY N/A 07/10/2020    DIAGNOSTIC COLONOSCOPY polypectomy w/hot snare, cold snare , Clip x 1  performed by Jonna Coup, MD at [facility] ENDOSCOPY    COLONOSCOPY      COLONOSCOPY N/A 05/17/2022    COLONOSCOPY DIAGNOSTIC aborted at 25cm performed by Jonna Coup, MD at [facility] ENDOSCOPY    CORONARY ARTERY BYPASS GRAFT  1995    LUMBAR DISCECTOMY      OTHER SURGICAL HISTORY  01/01/2011    whipple procedure done for pancreatic cancer    UPPER GASTROINTESTINAL ENDOSCOPY N/A 05/17/2022     ESOPHAGOGASTRODUODENOSCOPY DIAGNOSTIC ONLY performed by Jonna Coup, MD at [facility] ENDOSCOPY    UROLOGICAL SURGERY  2012    Percutaneous extraction of a kidney stone w/ fragmentation procedure        Social History:    Social History     Tobacco Use    Smoking status: Never     Passive exposure: Never    Smokeless tobacco: Never   Substance Use Topics    Alcohol use: Never                                Counseling given: Not Answered      Vital Signs (Current):   Vitals:    05/27/23 0438 05/27/23 0716 05/27/23 1201 05/27/23 1446   BP: 133/71 (!) 149/75 (!) 150/85 (!) 164/91   Pulse: 83 73 77 86   Resp: 18 18 18 18    Temp: 98.4 F (36.9 C) 98.8 F (37.1 C) 98.8 F (37.1 C) 98.8 F (37.1 C)   TempSrc: Temporal Temporal Temporal    SpO2: 95% 97% 98% 97%   Weight:    65.8 kg (145 lb 1 oz)   Height:    1.778 m (5\' 10" )                                              BP Readings from Last 3 Encounters:   05/27/23 (!) 164/91   05/05/23 (!) 155/83   09/11/22 (!) 114/90       NPO Status: Time of last liquid consumption: 1030  Time of last solid consumption: 0000                        Date of last liquid consumption: 05/27/23 (sips of water with meds)                        Date of last solid food consumption: 05/26/23    BMI:   Wt Readings from Last 3 Encounters:   05/27/23 65.8 kg (145 lb 1 oz)   05/05/23 65.8 kg (145 lb)   09/09/22 68 kg (150 lb)     Body mass index is 20.81 kg/m.    CBC:   Lab Results   Component Value Date/Time    WBC 13.0 05/26/2023 01:08 AM    RBC 3.64 05/26/2023 01:08 AM    HGB 10.9 05/26/2023 01:08 AM    HCT 34.3 05/26/2023 01:08 AM    MCV 94.2 05/26/2023 01:08 AM    RDW 49.0 05/26/2023 01:08 AM    PLT 245 05/26/2023 01:08 AM       CMP:   Lab Results   Component Value Date/Time    NA 135 05/26/2023 01:08 AM    K 3.8 05/26/2023 01:08 AM    CL 103 05/26/2023 01:08 AM    CO2 19 05/26/2023 01:08 AM    CO2 23.0 01/12/2019 05:27 AM    BUN 12 05/26/2023 01:08 AM    CREATININE 0.88  05/26/2023 01:08 AM    GFRAA >60 05/26/2023 01:08 AM    LABGLOM >60 05/26/2023 01:08 AM    GLUCOSE 98 05/26/2023 01:08 AM    CALCIUM 7.2 05/26/2023 01:08 AM    BILITOT 0.80 05/25/2023 08:26 PM    ALKPHOS 148 05/25/2023 08:26 PM    AST 27.0 05/25/2023 08:26 PM    ALT 15 05/25/2023 08:26 PM       POC Tests: No results for input(s): "POCGLU", "POCNA", "POCK", "POCCL", "POCBUN", "POCHEMO", "POCHCT" in the last 72 hours.    Coags:   Lab Results   Component Value Date/Time    PROTIME 15.0 05/25/2023 08:26 PM    INR 1.3 05/25/2023 08:26 PM       HCG (If Applicable): No results found for: "PREGTESTUR", "PREGSERUM", "HCG", "HCGQUANT"     ABGs: No results found for: "PHART", "PO2ART", "PCO2ART", "HCO3ART", "BEART", "O2SATART"     Type & Screen (If Applicable):  Lab Results   Component Value Date    ABORH A Rh Negative 05/16/2022    LABANTI NEG 05/16/2022       Drug/Infectious Status (If Applicable):  No results found for: "HIV", "HEPCAB"    COVID-19 Screening (If Applicable):   Lab Results   Component Value Date/Time    COVID19 NEGATIVE 05/25/2023 08:47 PM           Anesthesia Evaluation  Patient summary reviewed and Nursing notes reviewed  Airway: Mallampati: II  TM distance: >3 FB   Neck ROM: full  Mouth opening: > = 3 FB   Dental: normal exam         Pulmonary:Negative Pulmonary ROS and normal exam                               Cardiovascular:    (+) hypertension:, valvular problems/murmurs (.7 AVA): AS, CAD:, CABG/stent:, CHF: systolic and diastolic, hyperlipidemia      ECG reviewed      Echocardiogram reviewed  Beta Blocker:  Dose within 24 Hrs         Neuro/Psych:   (+) neuromuscular disease:             ROS comment: S/p fall  Acute metabolic encephalopathy, improved   GI/Hepatic/Renal:   (+) GERD:, liver disease:, renal disease: kidney stones         ROS comment: Hx of pancreatic cancer  - s/p Whipple  .   Endo/Other:    (+) blood dyscrasia: anemia, arthritis:., malignancy/cancer.                 Abdominal:              Vascular: negative vascular ROS.         Other Findings:             Anesthesia Plan      general     ASA 4             Anesthetic plan and risks discussed with patient.      Plan discussed with CRNA.                    Charlyne Mom, MD   05/27/2023

## 2023-05-27 NOTE — Plan of Care (Signed)
 Problem: ABCDS Injury Assessment  Goal: Absence of physical injury  Outcome: Progressing     Problem: Discharge Planning  Goal: Discharge to home or other facility with appropriate resources  Outcome: Progressing  Flowsheets (Taken 05/27/2023 0900)  Discharge to home or other facility with appropriate resources: Identify barriers to discharge with patient and caregiver     Problem: Skin/Tissue Integrity  Goal: Skin integrity remains intact  Description: 1.  Monitor for areas of redness and/or skin breakdown  2.  Assess vascular access sites hourly  3.  Every 4-6 hours minimum:  Change oxygen saturation probe site  4.  Every 4-6 hours:  If on nasal continuous positive airway pressure, respiratory therapy assess nares and determine need for appliance change or resting period  Outcome: Progressing  Flowsheets (Taken 05/27/2023 0900)  Skin Integrity Remains Intact: Monitor for areas of redness and/or skin breakdown     Problem: Pain  Goal: Verbalizes/displays adequate comfort level or baseline comfort level  Outcome: Progressing     Problem: Safety - Adult  Goal: Free from fall injury  Outcome: Progressing     Problem: Confusion  Goal: Confusion, delirium, dementia, or psychosis is improved or at baseline  Description: INTERVENTIONS:  1. Assess for possible contributors to thought disturbance, including medications, impaired vision or hearing, underlying metabolic abnormalities, dehydration, psychiatric diagnoses, and notify attending LIP  2. Institute high risk fall precautions, as indicated  3. Provide frequent short contacts to provide reality reorientation, refocusing and direction  4. Decrease environmental stimuli, including noise as appropriate  5. Monitor and intervene to maintain adequate nutrition, hydration, elimination, sleep and activity  6. If unable to ensure safety without constant attention obtain sitter and review sitter guidelines with assigned personnel  7. Initiate Psychosocial CNS and Spiritual Care  consult, as indicated  Outcome: Progressing  Flowsheets (Taken 05/27/2023 0900)  Effect of thought disturbance (confusion, delirium, dementia, or psychosis) are managed with adequate functional status: Institute high risk fall precautions, as indicated

## 2023-05-27 NOTE — Other (Incomplete)
 TRANSFER - OUT REPORT:    Verbal report given to [place] RN (name) on Tommy Brown  being transferred to ***(unit) for {TRANSFER NUUV:25366}       Report consisted of patient's Situation, Background, Assessment and   Recommendations(SBAR).     Information from the following report(s) {BSHSI SBAR OUT YQIHKVQ:25956} was reviewed with the receiving nurse.    Opportunity for questions and clarification was provided.      Patient transported with:   {TRANSPORTDETAILS:36942}

## 2023-05-27 NOTE — Op Note (Signed)
 Operative Note      Patient: Tommy Brown  Date of Birth: 1945-09-01  MRN: [id number]    [facility]  Operation Report  Patient: Tommy Brown               Sex: male          DOA: 05/25/2023  Date of Birth:  December 22, 1945      Age:  78 y.o.        LOS:  LOS: 1 day   MRN: [id number]                    CSN: [id number]  Room: OR/PL         PREOPERATIVE DIAGNOSES:  Intertrochanteric hip fracture.    POSTOPERATIVE DIAGNOSES:  same    PROCEDURE PERFORMED:  Placement of Synthes trochanteric nail.    Operative side:  Right    SURGEON:  Lyna Poser, M.D.     ASSISTANT:  [facility] SA Staff  (Exposure, retraction, hemostasis, wound closure, etc. Assistant surgeon was present and actively participated in the surgery for its entirety.  No intern, resident, or house staff was available to assist.  The complexity of this surgery required the participation of the Designer, television/film set, throughout.).     ANESTHESIA:  General Endotracheal     BLOOD LOSS:  200cc     FLUIDS RECEIVED:  Crystalloid     DRAINS:  none     COMPLICATIONS:  None.    Summary of Components Used:  See below     HISTORY: Patient presented to me, with findings noted above.  Intertrochanteric hip fracture diagnosed, via x-ray.  Risk and benefits of surgical management were discussed in detail.  Patient / family agreed to proceed.    DESCRIPTION OF PROCEDURE:  On the day of surgery, the patient was brought through the surgical admitting unit.  Appropriate IV antibiotics were administered.  Patient was escorted to the operating room.  A general anesthetic was performed by the anesthesiologist.  The patient was placed supine on the operating room table.  The Hana fracture frame was used.  All bony prominences were well-padded.  Both lower extremities were placed in well-padded traction boots.  A perineal post was used.  The fracture was reduced to an anatomic position, using standard closed technique.  ChloraPrep was used to prep skin.  Drapes were placed.    A small  incision was made over the greater trochanter, on the affected side.  A guidewire was placed across the proximal femur, through the tip of the greater trochanter, using standard technique.  This was overdrilled in the usual fashion.  The appropriate sized Synthes trochanteric nail was placed without difficulty.  Appropriate placement was confirmed on AP and lateral fluoroscopic imaging.  A guidewire was then placed through the operative jig, into the proximal neck and head region without difficulty.  Appropriate placement was noted in AP and lateral projections.  A depth gauge was used to measure length, and a spiral locking blade was placed without difficulty.  A distal interlocking screw, if appropriate, was placed in the usual fashion, from lateral to medial, per routine.    Final radiographs were obtained, in AP and lateral projections.  Wounds were then copiously irrigated.  Wounds were closed in layers.  Sterile dressings were applied.  The patient was subsequently extubated and taken to the recovery room in stable condition without complications.  All counts were correct.    Fatou Dunnigan Lenore Manner, MD  05/27/23         Implants:  Implant Name Type Inv. Item Serial No. Manufacturer Lot No. LRB No. Used Action   NAIL CANN TFNA TI 10X170MM 130DEG - [id number] Screw/Plate/Nail/Rod NAIL CANN TFNA TI 10X170MM 130DEG  [employer]_CR [id number] Right 1 Implanted   BLADE HELICAL TI TFNA - SNA Other BLADE HELICAL TI TFNA NA [employer]_CR NA Right 1 Implanted   SCREW LOCKING TI 5X38MM F/IM NAIL - SNA Screw/Plate/Nail/Rod SCREW LOCKING TI 5X38MM F/IM NAIL NA [employer] L[employer] ASIF_CR NA Right 1 Implanted         Drains:   Urinary Catheter 05/27/23 Foley (Active)       [REMOVED] External Urinary Catheter (Removed)   Site Assessment Clean,dry & intact 05/27/23 1451   Urine Color Yellow 05/27/23 1451   Urine Appearance Clear 05/27/23 1451   Output (mL) 550 mL 05/27/23 0438           Electronically signed by Arita Miss, MD on  05/27/2023 at 5:30 PM

## 2023-05-27 NOTE — Progress Notes (Signed)
 Echo completed

## 2023-05-27 NOTE — Progress Notes (Signed)
 Patient: Tommy Brown     78 y.o. male  Date of Admission:  05/25/2023        Assessment   Minimally displaced acute intertrochanteric right femoral neck fracture.   S/p fall  Acute metabolic encephalopathy, improved  Leukocytosis   NSVT  Multivessel CAD status post CABG in 1995. Cardiac catheterization in 2006 revealed patent bypass grafts. 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 Surgical plans for OR today - pending cardiology clearance, echo pending  Noted NSVT episode, cont telemetry. Repleted Mg. Pt reports no chest pain or SOB or anginal symptoms. Mets >4. He has known cardiac risk factors as outlined above. Cardiology consulted for preop assessment.   Monitor mental status, improved  Monitor wbc, improved. Afebrile, no definite signs of infection, hold on abx at this time. Follow culture data. UA / CXR neg  Patient wife requesting palliative care consult  Oncologist callled, [facility] Dr Jodie Echevaria will see while pt admitted  Cont meds as ordered    DVT ppx: heparin  Electrolytes: repleted Mg and recheck improved    I have reviewed and ordered pertinent labs and radiological studies, reconciled medications, and reviewed medical records/notes.    I have discussed with patient and nursing staff.    Patient's overall condition: stable     A total of 35 minutes was spent on this follow-up patient encounter for medical problems as outlined above, which included obtaining and reviewing history, reviewing labs/testing/imaging ordered at prior visit, performing exam, discussing treatment plan,  ordering additional studies, and documenting in the record.       Current Inpatient Meds and Allergies:    Scheduled Meds:  gabapentin, 600 mg, 4x daily  heparin (porcine), 5,000 Units, BID  timolol, 1 drop, Nightly  allopurinol, 300 mg, Daily  ferrous sulfate, 325 mg, Daily with breakfast  tamsulosin, 0.8 mg, Daily  aspirin, 81 mg, Daily  omeprazole, 40 mg, Daily  therapeutic multivitamin-minerals, 1 tablet, Daily  magnesium oxide, 400 mg, Daily      Drips:          Allergies:  Allergies   Allergen Reactions    Codeine Itching and Swelling    Oxycodone Anaphylaxis    Amoxicillin Diarrhea    Hydroxyzine Pamoate Itching and Other (See Comments)     Other reaction(s): Unknown    Meperidine Itching, Other (See Comments), Palpitations and Swelling     Other reaction(s): Unknown    Metoclopramide Itching, Other (See Comments) and Swelling     Other reaction(s): Other (See Comments)  Reaction unknown   Reaction unknown     Morphine Nausea And Vomiting and Other (See Comments)    Oxycodone-Acetaminophen Itching    Prochlorperazine Itching and Other (See Comments)     Other reaction(s): Unknown (comments)  Other reaction(s): Unknown  Unsure of reaction    Amoxicillin Diarrhea    Codeine Itching and Swelling    Hydroxyzine Hcl Hives    Hydroxyzine Pamoate Itching    Metoclopramide Itching and Swelling    Other Other (See Comments)     VISTAID  ANESTHESIA  Other      VISTAID ANESTHESIA    Oxycodone-Acetaminophen Itching    Prochlorperazine Itching    Meperidine Itching, Swelling and Palpitations    Morphine Nausea And Vomiting       Subjective:      Chief Complaint: Altered Mental Status and Fall      No new complaints. Feeling fine. Wants to get surgery over with. Hungry        Objective:      Physical Exam:   BP (!) 150/85   Pulse 77   Temp 98.8 F (37.1 C) (Temporal)   Resp 18   Ht 1.778 m (5\' 10" )   Wt 65.8 kg (145 lb)   SpO2 98%   BMI 20.81 kg/m   Gen: Well-nourished, well-developed, in no apparent distress,  awake in bed  Cardio: RRR, S1-S2  Pulm: Coarse, no wheezing, no cough  GI: Soft, nontender, nondistended, normoactive bowel sounds  Extremities: +edema, see ortho note for details  Skin: Clear, dry, intact anteriorly, see nursing notes for posterior details  Psych: Alert and awake        Labwork and Ancillary Studies:    Clinical laboratory tests, radiology, imaging, other test results, and medications were individually reviewed.    CBC w/Diff   Recent Labs     05/25/23  2026 05/26/23  0108   WBC 14.6* 13.0*   RBC 4.33 3.64*   HCT 40.4 34.3*   MCV 93.3 94.2   MCH 29.8 29.9   MCHC 31.9 31.8   RDW 49.0* 49.0*   MPV 10.4* 10.8*      Recent Labs     05/25/23  2026 05/26/23  0108   MONOS 8.0 8.1   BASOS 0.1 0.1   RDW 49.0* 49.0*       Comprehensive Metabolic Profile   Recent Labs     05/25/23  2026 05/26/23  0108   NA 131* 135*   CO2 24 19*   ANIONGAP 11 13   BUN 13 12   GLUCOSE 128* 98      Recent Labs     05/25/23  2026 05/26/23  0108   CALCIUM 9.0 7.2*   ALBUMIN 2.6*  --    ALKPHOS 148*  --        XR PELVIS (1-2 VIEWS)  Addendum Date: 05/25/2023  Lucency overlying right greater trochanter and extending towards the lesser trochanter is felt to represent a comminuted mildly displaced acute intertrochanteric fracture. Electronically signed by: Earline Mayotte, MD 05/25/2023 11:54 PM EDT          Workstation ID: [id number]     Result Date: 05/25/2023  IMPRESSION:  No fracture. Electronically signed by: Earline Mayotte, MD 05/25/2023 10:11 PM EDT          Workstation ID: [id number]     XR FEMUR RIGHT (MIN 2 VIEWS)  Result Date: 05/25/2023  IMPRESSION:  Minimally displaced intertrochanteric right femoral neck fracture.  Electronically signed by: Earline Mayotte, MD 05/25/2023 11:52 PM EDT          Workstation ID: [id number]     CT HEAD WO CONTRAST  Result Date: 05/25/2023  Impression: Chronic microvascular ischemic changes. Electronically signed by: Earline Mayotte, MD 05/25/2023 10:42 PM EDT          Workstation ID:  [id number]     CT CERVICAL SPINE WO CONTRAST  Result Date: 05/25/2023  IMPRESSION: No acute intracranial abnormality. No acute fractures or dislocation in the cervical spine. Electronically signed by: Vallery Sa  Sabat, MD 05/25/2023 10:37 PM EDT          Workstation ID: [id number]     XR CHEST PORTABLE  Result Date: 05/25/2023  IMPRESSION:  No acute disease. Electronically signed by: Earline Mayotte, MD 05/25/2023 10:12 PM EDT          Workstation ID: [id number]     XR FEMUR LEFT (MIN 2 VIEWS)  Result Date: 05/25/2023  IMPRESSION:  No fracture. Electronically signed by: Earline Mayotte, MD 05/25/2023 10:10 PM EDT          Workstation ID: [id number]         Note:  This was dictated using a computer transcription program.  Although proofread, it may contain computer transcription errors or phonetic errors.  Other human proofreading errors may exist.  Corrections may be performed at a later time.  If there is any question regarding the details of this note, please feel free to contact me directly.             Lamont Dowdy, M.D.   Division of Hospital Medicine  [facility]  Please epic chat between 7am and 5pm or page the dedicated pager after hours.

## 2023-05-27 NOTE — Anesthesia Post-Procedure Evaluation (Signed)
 Department of Anesthesiology  Postprocedure Note    Patient: Tommy Brown  MRN: [id number]  Birthdate: 1945-07-20  Date of evaluation: 05/27/2023    Procedure Summary       Date: 05/27/23 Room / Location: [facility] MAIN 07 / [facility] MAIN OR    Anesthesia Start: 1627 Anesthesia Stop: 1739    Procedure: TROCHANTERIC NAILING RIGHT HIP (Right: Hip) Diagnosis:       Fracture of unspecified part of neck of right femur, initial encounter for closed fracture      (Fracture of unspecified part of neck of right femur, initial encounter for closed fracture [S72.001A])    Surgeons: Arita Miss, MD Responsible Provider: Arneta Cliche, MD    Anesthesia Type: General ASA Status: 4            Anesthesia Type: General    Aldrete Phase I: Aldrete Score: 10    Aldrete Phase II:      Anesthesia Post Evaluation    Patient location during evaluation: PACU  Patient participation: complete - patient participated  Level of consciousness: awake and alert  Pain score: 3  Airway patency: patent  Nausea & Vomiting: no vomiting and no nausea  Cardiovascular status: hemodynamically stable  Respiratory status: acceptable  Hydration status: euvolemic  Multimodal analgesia pain management approach  Pain management: adequate    No notable events documented.

## 2023-05-27 NOTE — Anesthesia Procedure Notes (Signed)
 Arterial Line:    An arterial line was placed using ultrasound guidance, in the OR for the following indication(s): continuous blood pressure monitoring.    A 20 gauge (size), 1 and 3/4 inch (length), Arrow (type) catheter was placed, Seldinger technique used, into the left radial artery and 1% lidocaine 1cc, secured by Tegaderm and tape.  Anesthesia type: Local  Local infiltration: Injection    Events:  patient tolerated procedure well with no complications.05/27/2023 4:38 PM4/02/2023 4:43 PM  Anesthesiologist: Arneta Cliche, MD  Performed: Anesthesiologist   Preanesthetic Checklist  Completed: patient identified, IV checked, site marked, risks and benefits discussed, surgical/procedural consents, equipment checked, pre-op evaluation, timeout performed, anesthesia consent given, oxygen available, monitors applied/VS acknowledged, fire risk safety assessment completed and verbalized and blood product R/B/A discussed and consented

## 2023-05-28 LAB — CBC
Hematocrit: 31 % — ABNORMAL LOW (ref 36.0–55.0)
Hemoglobin: 9.8 g/dL — ABNORMAL LOW (ref 13.0–18.0)
MCH: 29.7 pg (ref 23.0–34.6)
MCHC: 31.6 g/dL (ref 30.0–36.0)
MCV: 93.9 fL (ref 80.0–98.0)
MPV: 11.4 fL — ABNORMAL HIGH (ref 6.0–10.0)
Platelets: 271 10*3/uL (ref 140–450)
RBC: 3.3 M/uL — ABNORMAL LOW (ref 3.80–5.70)
RDW: 48.5 — ABNORMAL HIGH (ref 35.1–43.9)
WBC: 8.9 10*3/uL (ref 4.0–11.0)

## 2023-05-28 LAB — COVID-19: SARS-CoV-2: NEGATIVE

## 2023-05-28 MED ORDER — POLYETHYLENE GLYCOL 3350 17 G PO PACK
17 g | Freq: Every day | ORAL | Status: DC
Start: 2023-05-28 — End: 2023-05-30
  Administered 2023-05-28 – 2023-05-30 (×3): 17 g via ORAL

## 2023-05-28 MED FILL — SODIUM CHLORIDE FLUSH 0.9 % IV SOLN: 0.9 % | INTRAVENOUS | Qty: 10 | Fill #0

## 2023-05-28 MED FILL — OMEPRAZOLE 20 MG PO CPDR: 20 MG | ORAL | Qty: 2 | Fill #0

## 2023-05-28 MED FILL — FEROSUL 325 (65 FE) MG PO TABS: 325 (65 Fe) MG | ORAL | Qty: 1 | Fill #0

## 2023-05-28 MED FILL — METHOCARBAMOL 500 MG PO TABS: 500 MG | ORAL | Qty: 1 | Fill #0

## 2023-05-28 MED FILL — GABAPENTIN 300 MG PO CAPS: 300 MG | ORAL | Qty: 2 | Fill #0

## 2023-05-28 MED FILL — MAGNESIUM OXIDE -MG SUPPLEMENT 400 (240 MG) MG PO TABS: 400 (240 Mg) MG | ORAL | Qty: 1 | Fill #0

## 2023-05-28 MED FILL — KETOROLAC TROMETHAMINE 15 MG/ML IJ SOLN: 15 MG/ML | INTRAMUSCULAR | Qty: 1 | Fill #0

## 2023-05-28 MED FILL — CEFAZOLIN SODIUM-DEXTROSE 2-4 GM/100ML-% IV SOLN: 2-4 GM/100ML-% | INTRAVENOUS | Qty: 100 | Fill #0

## 2023-05-28 MED FILL — TRAMADOL HCL 50 MG PO TABS: 50 MG | ORAL | Qty: 1 | Fill #0

## 2023-05-28 MED FILL — THERAPEUTIC-M PO TABS: ORAL | Qty: 1 | Fill #0

## 2023-05-28 MED FILL — ALLOPURINOL 300 MG PO TABS: 300 MG | ORAL | Qty: 1 | Fill #0

## 2023-05-28 MED FILL — SENNOSIDES-DOCUSATE SODIUM 8.6-50 MG PO TABS: 8.6-50 MG | ORAL | Qty: 1 | Fill #0

## 2023-05-28 MED FILL — POLYETHYLENE GLYCOL 3350 17 G PO PACK: 17 g | ORAL | Qty: 1 | Fill #0

## 2023-05-28 MED FILL — HEPARIN SODIUM (PORCINE) 5000 UNIT/ML IJ SOLN: 5000 UNIT/ML | INTRAMUSCULAR | Qty: 1 | Fill #0

## 2023-05-28 MED FILL — ASPIRIN 81 MG PO CHEW: 81 MG | ORAL | Qty: 1 | Fill #0

## 2023-05-28 MED FILL — TAMSULOSIN HCL 0.4 MG PO CAPS: 0.4 MG | ORAL | Qty: 2 | Fill #0

## 2023-05-28 NOTE — Care Coordination-Inpatient (Addendum)
 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 dialysis, wound vac, IV abx, O2, med assist, SDOH interventions, etc.     N/A     Discharge Plan Updates:  Therapy recommending SNF for discharge planning. Patient/spouse agreeable to [facility]. Will send referral. No auth required. CM will continue to follow for discharge needs.       2:20 PM- [facility] approved SNF referral and booked in [place]. Per Attending, need Ortho clearance prior to discharge. Patient will need COVID and BM for SNF admission. COVID swab ordered. Nursing updated on BM requirement.           Cyril Loosen , Case Manager  May 28, 2023  2:08 PM  Department Phone: [phone]

## 2023-05-28 NOTE — Progress Notes (Signed)
 Postoperative day #1.  Stable overnight.  Vitals stable.  Pain controlled.  Plan-  Mobilize as tolerated.  Weightbearing as tolerated.  Physical and Occupational Therapy.  Medical management.  Discharge planning.

## 2023-05-28 NOTE — Progress Notes (Signed)
 OCCUPATIONAL THERAPY EVALUATION   Acknowledge Orders  Time  OT Charge Capture  Rehab Caseload Tracker    [employer] AM-PAC "6 Clicks" Daily Activity Inpatient Short Form  -  Patient: Tommy Brown (78 y.o. male)  Room: 2115/2115    Primary Diagnosis: Dehydration [E86.0]  Encephalopathy [G93.40]  Primary pancreatic cancer with metastasis to other site [facility]) [C25.9]  Acute metabolic encephalopathy [G93.41]  Ground-level fall [W18.30XA]  Closed intertrochanteric fracture of hip, right, initial encounter [S72.141A]  Procedures this admission: Procedure(s) (LRB):  TROCHANTERIC NAILING RIGHT HIP (Right) 1 Day Post-Op    Date of Admission: 05/25/2023   Length of Stay:  2 day(s)  Insurance: Payor: MEDICARE / Plan: MEDICARE PART A AND B / Product Type: *No Product type* /      Date: 05/28/2023  Time In: 1100        Time Out: 1208       Total Minutes: 68   Treatment time: 53 minutes  Treatment included: therapeutic activity, functional mobility retraining, functional balance retraining, ADI retraining, activity tolerance, education for positioning, adaptive equipment training and/or educational instruction during/immediately following OT evaluation    Isolation:  No active isolations       MDRO: No active infections    Precautions: falls   Ordered weight bearing status: weight bearing as tolerated    Current diet order: ADULT DIET; Regular    Orders, labs, and chart reviewed on Tommy Brown. Communicated with nursing staff. Patient cleared to participate in Occupational Therapy.    ASSESSMENT   Based on the objective data described below, the patient presents with     - Pt seen for skilled OT eval and treat. Pt with h/o pancreatic ca, in remission, found now to have cancerous tumor on pancreas with mets to liver, presents s/p fall resulting in R hip fx. S/p nailing and is WBAT post op.  Pt up in chair, A+Ox3 yet confused at times, requiring re-direction, with decreased processing new information. Only 4/10  pain at R hip/ mostly at abd. Asking for full bath and to don shirt and underwear. Completed full ADL mostly seated with Min A for UB ADL, and Mod-Max A for LB Adls. Able to perform 2 sit<>stands with Mod A overall for rear peri-care and hiking underwear. Difficulty transferring hands to RW. Pt very pleased to be washed and dressed. Motivated for therapy despite requiring rest breaks. Consider SNF   - + motivation to participate in OT to return to prior level of function    Patient is below their functional baseline and will benefit from skilled occupational therapy intervention in the acute care setting to address the above mentioned impairments.    Patient's rehabilitation potential is considered to be Good for below stated goals.     Recommendations:  -  Recommend continued occupational therapy during acute stay.   -  Recommend out of bed activity to counteract ill effects of bedrest, with assistance from staff as needed.    Discharge Recommendations:   -  Skilled nursing facility (SNF):  Would benefit to improve independence in ADLs, strength, activity tolerance and balance to ensure a successful and sustainable return to prior level of function.    FUNCTIONAL ASSESSMENT  AM-PAC Inpatient Daily Activity Raw Score: 15 (05/28/23 1414)    -  SNF:  Current research shows that an AM-PAC score of 17 or less is not associated with a discharge to the patient's home setting.  Based on an AM-PAC score and their  current ADL deficits; it is recommended that the patient have 3-5 sessions per week of Occupational Therapy at d/c to increase the patient's independence.     This AMPAC score should be considered in conjunction with interdisciplinary team recommendations to determine the most appropriate discharge setting. Patient's social support, diagnosis, medical stability, and prior level of function should also be taken into consideration.    Equipment Recommendations for Discharge:  -  DME needs to be determined at time of  d/c from rehab     PRIOR LEVEL OF FUNCTION     Information was obtained by: patient, spouse  Home environment: Patient lives with spouse in an apartment on 1st floor.   Patient's bathroom has tub/shower unit.   Prior level of function: Patient is independent with no limitations.  Prior level of Instrumental Activities of Daily Living: Patient and spouse share all IADLs. Riding bike to work daily. Works at [employer] a day  Home equipment: rolling walker, cane    PLAN      Patient will be followed by occupational therapy 5 x/wk x 4 wks to address goals while hospitalized as patient's status and schedule permit.    Planned interventions may include any combination of the following: Adaptive equipment, ADI training, activity tolerance, functional balance training, functional mobility training, therapeutic exercise, therapeutic activity, patient/caregiver education and training, and home exercise program     Patient and/or family have participated as able in goal setting and plan of care.  GOALS     OT goals initiated 05/28/2023 and will be met by patient within 10 sessions     -  Patient will perform upper body ADLs with set-up to improve independence with ADLs.   -  Patient will be minimum assistance with lower body ADLs to improve independence with ADLs.   -  Patient will perform toileting tasks with minimum assistance to improve independence with ADLs.   -  Patient will be minimum assistance with functional transfers to improve independence with ADL related mobility.   -  Patient will be minimum assistance with bed mobility in preparation for OOB ADLs.  - Patient will complete private room navigation with minimum assistance to complete toileting tasks in bathroom in order to improve independence with ADL related mobility.    EDUCATION/COMMUNICATION     Barriers to learning/limitations: None    Education provided to: patient on (+) role of OT, (+) OT plan of care, (+) Instructed patient in the benefits of  maintaining activity tolerance, functional mobility, and independence with self care tasks during acute stay  to ensure safe return home and to baseline. Encouraged patient to increase frequency and duration OOB, be out of bed for all meals, perform daily ADLs (as approved by RN/MD regarding bathing etc), and performing functional mobility to/from bathroom with staff assistance as needed., (+) weight bearing and weight bearing precautions, (+)  bathroom safety, home safety.  Reviewed: picture of safe bathroom & sitting area, adaptive equipment use, (+) staff assistance with mobility, (+) change positions frequently, (+) functional mobility, (+) discharge disposition/recommendations, (+) use of ice to assist with pain management    Educational handouts issued: OT and leg surgery, home safety booklet: bathroom safety, home safety, picture of safe bathroom & sitting area, reacher and sock aid use.     Patient / family response to education: verbalized understanding, needs reinforcement    SUBJECTIVE     Patient "I am ready to work so I can go home"  Pain Assessment: 4 / 10, location: abd and R hip     OBJECTIVE DATA SUMMARY     Patient was admitted to the hospital on 05/25/2023 with   Chief Complaint   Patient presents with    Altered Mental Status    Fall     Present illness history:   Patient Active Problem List    Diagnosis Date Noted    Closed intertrochanteric fracture of hip, right, initial encounter 05/26/2023    Encephalopathy 05/25/2023    Intractable nausea and vomiting 09/09/2022    Rectal bleeding 05/09/2022    Lower GI bleed 05/09/2022    Hypertensive retinopathy, bilateral 11/17/2019    Vitreous degeneration, left eye 11/17/2019    Puckering of macula, right eye 11/17/2019    Tributary (branch) retinal vein occlusion, right eye, 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/17/2019    Acute chest pain 07/21/2019    History of coronary artery disease 07/21/2019    Chest pain 05/03/2019    Palpitations 05/03/2019    Cervical disc  disease 05/03/2019    Carotid artery disease 05/03/2019    Dyslipidemia 05/03/2019    Ejection fraction < 50% 05/03/2019    Preop cardiovascular exam 05/03/2019    Pneumonia due to COVID-19 virus 01/10/2019    UTI (urinary tract infection) 07/15/2018    AKI (acute kidney injury) 06/22/2018    Metabolic acidosis 01/26/2018    Sepsis (HCC) 01/17/2018    Testosterone deficiency 01/15/2017    AR (allergic rhinitis) 01/15/2017    Gout 01/15/2017    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    Liver cyst 07/11/2016    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    Murmur, cardiac 01/03/2015    Port-A-Cath in place 02/01/2013    Syncope 01/05/2013    Atherosclerosis of coronary artery 01/05/2013    H/O neck surgery 01/05/2013    Pneumonia 01/05/2013    BPH (benign prostatic hyperplasia) 01/05/2013    Seasonal allergies 01/05/2013    Hypercholesteremia 01/05/2013    Diarrhea 01/05/2013    History of syncope 09/05/2011    Steatorrhea 02/28/2011    Bacteremia due to Klebsiella pneumoniae 01/23/2011    Malignant neoplasm of head of pancreas (HCC) 01/21/2011    Clostridium difficile diarrhea 01/10/2011    Essential hypertension 12/21/2010    Coronary artery disease (CAD) excluded 12/21/2010    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      Previous medical history:   Past Medical History:   Diagnosis Date    Arthritis     Benign prostate hyperplasia     Bladder infection     Chronic kidney disease     Coronary arteriosclerosis     Coronary atherosclerosis of artery bypass graft     Decreased testosterone level     Diverticular disease     Essential hypertension     Gout     History of acute renal failure     History of anemia     History of malignant neoplasm of pancreas     Hx of CABG     Kidney stone     Kidney stones     Neuropathy     NECK, SHOULDERS    Pancreatic cancer (HCC)     Sepsis (HCC)        PATIENT FOUND     Bedside chair, (+) bed/chair exit alarm, (+) visitor: spouse    COGNITIVE STATUS      Mental status:  Patient is A+Ox3, slow  processing at times   Communication: normal, speech  and language intact  Attention Span: Decreased,cues to stay on task.   Follows commands: follows 1- step  commands/direction  Safety/Judgement:  appropriate awareness of environment and need for assistance  Hearing:   grossly intact  Vision:   grossly intact    UPPER EXTREMITY ASSESSMENT     Dominance:right  RIGHT: active range of motion is WFL  Strength: WFL's   LEFT:   active range of motion is WFL  Strength: WFL's            ACTIVITIES OF DAILY LIVING  ( 38 Minutes)     Based on direct observation, simulation and clinical assessment.  (clincial judgement based on: activity tolerance, balance, safety awareness, cognition, functional strength/ROM/coordination of all extremities)    Eating:           - independent  Grooming:     - independent  UB bathing:   - minimum assistance  LB bathing:   -  maximum assistance  UB dressing: - minimum assistance  LB dressing: - maximum assistance  Toileting:       - total assistance    Treatment:   -  Instructed patient  in the following:    1. Home safety - (I.E remove throw rugs, appropriate height for sitting, remove clutter/clear pathways,recliner safety, change of floor surfaces)   2. Bathroom safety - (I.E. Grab bars, DME options, no slip rugs)  3. AE options if needed.  to increase independence and fall prevention during standing level ADLs and mobility.   -   Patient instructed to don/doff right LE first/last.   -   Patient instructed to don all clothing while seated then stand for over hips. Doff all clothing to knees in standing, then sit to doff clothing from knees to feet in order to facilitate fall prevention, pain management, and energy conservation during lower body ADLs.  Patient was instructed in adaptive equipment options for ADL's, reacher and sock aid, as needed.  -  Tub/shower transfers:  Patient instructed to use the same technique as with stairs, when entering and exiting tub ("up with the non-surgical,  down with the surgical leg").   Patient verbalized understanding.   - Pt asking for bath. Able to bath UB with SBA however fatigued easily, requiring Min A to complete. Able to wash front of BLEs and peri-care, while seated, otherwise Max A.   -Max A to hike underwear in standing  -Min A to don pull over.   -Cues for technique and sequencing.     THERAPEUTIC ACTIVITIES: FUNCTIONAL MOBILITY AND BALANCE STATUS  ( 15 Minutes)     Mobility:  -  Sit to stand -  moderate assistance, gait belt. Cues for technique. Able to push down with Ues to assist. Difficulty transferring UE to RW.   -  Stand to sit -  moderate assistance, gait belt     Transfers:  -  Functional transfers: maximum assistance    Treatment:    -  Instructed patient in proper positioning and safety during mobility and transfers:   Lead with the walker and then the surgical leg out in front of you.  When attempting to sit or stand, for comfort you can extend the surgical leg out in front of you.  When backing up to a chair, bring the non-surgical leg first.  -  Discussed with patient the following 1. Do not drive while taking a narcotic pain medicine  2. Do not drive until cleared by your surgeon     -  Instructed patient in proper hand placement technique during sit <->stand:  Push up from the hand rails or sitting surface of the chair/bedside commode/bed when standing.   Always reach back for the hand rails or sitting surface of the chair/bedside commode/bed when sitting.      -  Instructed patient in use of ice to assist with pain management.   -Tolerated 2 sit<>stands for bathing and clothing mgmnt.       ACTIVITY TOLERANCE      - motivated to increase activity  - tolerance to sustained activity limited by fatigue  - requires increased time  - activity is limited by pain  - requires rest breaks    Treatment:    -  Instructed patient in the following; 1. Encouraged patient to sit up in chair 45-60 minutes at a time.  2. Alternate between the bed, walking  with staff and sitting in the chair to ease surgical pain. 3. The importance of activity while hospitalized to prevent a decline in function.  Recommend the patient get out of bed to chair 2-3 times a day, with assistance as needed.    FINAL LOCATION     Seated in bed side chair, all needs within reach. Patient agrees to call for assistance.  Patient set up with tray table., (+) spouse present    Thank you for this referral.    Edman Circle, OTR/L  May 28, 2023

## 2023-05-28 NOTE — Progress Notes (Signed)
 PHYSICAL THERAPY EVALUATION     Acknowledge Orders  Time  PT Charge Capture  Rehab Caseload Tracker  [employer] AM-PAC "6 Clicks" Basic Mobility Inpatient Short Form  -    Patient: Tommy Brown (78 y.o. male)  Room: 2115/2115    Primary Diagnosis: Dehydration [E86.0]  Encephalopathy [G93.40]  Primary pancreatic cancer with metastasis to other site [facility]) [C25.9]  Acute metabolic encephalopathy [G93.41]  Ground-level fall [W18.30XA]  Closed intertrochanteric fracture of hip, right, initial encounter [S72.141A]   Procedure(s) (LRB):  TROCHANTERIC NAILING RIGHT HIP (Right) 1 Day Post-Op  Date of Admission: 05/25/2023   Length of Stay:  2 day(s)  Insurance: Payor: MEDICARE / Plan: MEDICARE PART A AND B / Product Type: *No Product type* /      Date: 05/28/2023  Time In: 0855       Time Out: 0948   Total Minutes: 53    Isolation:  No active isolations       MDRO: No active infections  Current diet order: ADULT DIET; Regular    Precautions: falls, pressure injury risk   Ordered weight bearing status: weight bearing as tolerated     ASSESSMENT:    Patient seen for physical therapy evaluation and treatment.     - decreased independence with functional mobility  - OOB with max assist x 1  - gait training 2 feet with rolling walker/gait belt, unable to increase distance d/t weakness  - impaired functional balance  - breaks and pacing required  - decreased tolerance to sustained activity   - deconditioning and generalized muscle weakness  - c/o of R hip pain, provided ice at end of PT session  - active participation in therapeutic exercises  - good motivation to participate in PT, pt motivated to complete activities independently    Patient is currently mobilizing significantly below baseline and will benefit from skilled acute PT services to address mobility deficits and optimize outcomes.     Patient's rehabilitation potential for below stated goals: good    Recommendations:  Recommend continued physical therapy  during acute stay. Recommend activity as tolerated. Recommend patient sit in chair for all meals. Continue HEP, as instructed.  Discharge Recommendations:   Skilled nursing facility (SNF): patient will benefit from further therapy at rehab facility to increase strength and endurance to return to prior level of function.  Further Equipment Recommendations for Discharge:   Recommend patient use rolling walker; patient has rolling walker at home     AM-PAC: AM-PAC Inpatient Mobility Raw Score : 12  -  SNF:  Current research shows that an AM-PAC score of 17 or less is not associated with a discharge to the patient's home setting.  Based on an AM-PAC score and their current ADL deficits; it is recommended that the patient have 3-5 sessions per week of Physical Therapy at d/c to increase the patient's independence.     This AMPAC score should be considered in conjunction with interdisciplinary team recommendations to determine the most appropriate discharge setting. Patient's social support, diagnosis, medical stability, and prior level of function should also be taken into consideration.    PRIOR LEVEL OF FUNCTION / HOME ENVIRONMENT:      Information was obtained by patient and patient's spouse at bedside  Home environment: Patient lives with spouse in a 1 story apartment. No steps to enter.   Prior level of function: independent ambulation, works at [employer] 4 hours/day, wife performs household chores and meal preparation   Home equipment:  rolling walker, straight cane     PLAN:      Patient will benefit from skilled Physical Therapy intervention to address the above impairments to return to prior level of function. Patient will be seen PT Plan of Care: Daily.    Patient will achieve PT goals in 1 week.     PHYSICAL THERAPY GOALS:     - Patient will be min. assist with bed mobility in preparation for EOB activities.   - Patient will be min. assist with transfers in preparation for OOB activities and ambulation.  -  Patient will tolerate sitting up in chair for 1 hour for meals at least twice a day.  - Patient will ambulate with minimal assistance for 10 feet with rolling walker to promote functional independence at home.      PLANNED INTERVENTIONS:      Skilled Physical Therapy services will provide functional mobility training, therapeutic exercises, therapeutic activities, patient/caregiver education as indicated, and will modify and progress therapeutic interventions to reach the stated goals.     COMMUNICATION/EDUCATION:     Education:   Ill effects of bedrest, benefits of activity, OOB to chair for meals and mobilize as tolerated, call staff for assistance, safety, activity pacing, HEP handout provided and reviewed, ankle pumps to promote improved circulation and prevent DVTs, use of ice, disposition, role of PT, PT plan of care, all questions answered   Education provided to:    patient and patient's spouse at bedside; opportunity for questions and clarification was provided.   Readiness to learn/comprehension:   verbalized understanding   Barriers to learning/limitations:   none     SUBJECTIVE:     Patient agreed to PT. "I'll do whatever you tell me to do so I can get out of here."  Patient reports R hip pain.  Pain not rated.  Provided ice at conclusion of PT session.    OBJECTIVE DATA SUMMARY:      Orders, labs, and chart reviewed on Tommy Brown. Communicated with patient's nurse, patient's spouse at bedside.     Present illness history:   Patient Active Problem List    Diagnosis Date Noted    Closed intertrochanteric fracture of hip, right, initial encounter 05/26/2023    Encephalopathy 05/25/2023    Intractable nausea and vomiting 09/09/2022    Rectal bleeding 05/09/2022    Lower GI bleed 05/09/2022    Hypertensive retinopathy, bilateral 11/17/2019    Vitreous degeneration, left eye 11/17/2019    Puckering of macula, right eye 11/17/2019    Tributary (branch) retinal vein occlusion, right eye, stable (HCC)  11/17/2019    Acute chest pain 07/21/2019    History of coronary artery disease 07/21/2019    Chest pain 05/03/2019    Palpitations 05/03/2019    Cervical disc disease 05/03/2019    Carotid artery disease 05/03/2019    Dyslipidemia 05/03/2019    Ejection fraction < 50% 05/03/2019    Preop cardiovascular exam 05/03/2019    Pneumonia due to COVID-19 virus 01/10/2019    UTI (urinary tract infection) 07/15/2018    AKI (acute kidney injury) 06/22/2018    Metabolic acidosis 01/26/2018    Sepsis (HCC) 01/17/2018    Testosterone deficiency 01/15/2017    AR (allergic rhinitis) 01/15/2017    Gout 01/15/2017    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    Liver cyst 07/11/2016    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    Murmur, cardiac 01/03/2015    Port-A-Cath in place 02/01/2013    Syncope 01/05/2013  Atherosclerosis of coronary artery 01/05/2013    H/O neck surgery 01/05/2013    Pneumonia 01/05/2013    BPH (benign prostatic hyperplasia) 01/05/2013    Seasonal allergies 01/05/2013    Hypercholesteremia 01/05/2013    Diarrhea 01/05/2013    History of syncope 09/05/2011    Steatorrhea 02/28/2011    Bacteremia due to Klebsiella pneumoniae 01/23/2011    Malignant neoplasm of head of pancreas (HCC) 01/21/2011    Clostridium difficile diarrhea 01/10/2011    Essential hypertension 12/21/2010    Coronary artery disease (CAD) excluded 12/21/2010    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      Previous medical history:   Past Medical History:   Diagnosis Date    Arthritis     Benign prostate hyperplasia     Bladder infection     Chronic kidney disease     Coronary arteriosclerosis     Coronary atherosclerosis of artery bypass graft     Decreased testosterone level     Diverticular disease     Essential hypertension     Gout     History of acute renal failure     History of anemia     History of malignant neoplasm of pancreas     Hx of CABG     Kidney stone     Kidney stones     Neuropathy     NECK, SHOULDERS    Pancreatic cancer (HCC)      Sepsis (HCC)         PATIENT FOUND:     Semi reclined in bed. (+) bed alarm. (+) SCDs. (+) spouse present.     COGNITIVE STATUS:     Mental status/general cognition: normal mood, behavior, and thought processes, pleasant and cooperative     Communication: intact   Follows commands: intact   Safety/Judgement: decreased awareness of need for assistance     EXTREMITIES ASSESSMENT:      Strength:  Strength is grossly graded as within functional limits   Range of Motion: all extremities grossly WFL   Sensation: intact to light touch     THERAPEUTIC ACTIVITIES; FUNCTIONAL MOBILITY AND BALANCE STATUS:    Patient received/participated in evaluation (10 minutes), therapeutic activities (15 minutes) , therapeutic exercises (13 minutes), and gait training (15 minutes)     Bed mobility:  supine to sit : max assist, verbal cues; requires significantly increased time, difficulty initiating momentum, needs rest breaks, patient motivated to perform bed mobility independently     Transfers:  Sit - stand: max assist, provided cueing for hand placement, eccentric lowering and safety; requires increased time   Stand - sit: max assist, provided cueing for hand placement, eccentric lowering and safety; decreased control and plops into chair d/t right UE "giving out"     Functional Balance Sitting: Fair static: patient able to maintain balance with handhold support; requires occasional minimal assistance   Functional Balance Standing:  poor: moderate assistance to maintain balance, needs assistive device, requires upper extremity support     Gait Training:  Distance: 2 feet   Gait analysis: unsteady, decreased walking speed, shuffled steps, reduced stride and step length, increased stance periods, difficulty with weight shift, difficulty with advancing LE, guarded, downward gaze, heavy reliance of UE on walker   Assistance/assistive device: mod/max assist and cueing for sequencing, walker management, activity pacing, and safety;   rolling  walker, gait belt     Therapeutic exercises:  2x 10 reps:   ankle pumps, heel slides  ACTIVITY TOLERANCE:     - requires increased time, rest breaks and repeated attempts with functional tasks  - generalized muscle weakness  - activity tolerance limited by deconditioning    FINAL LOCATION:     Seated in bed side chair, all needs within reach. Patient agrees to call for assistance. Positioned with pillows for comfort. Patient set up with tray. (+) chair alarm. Provided ice pack. (+) spouse present.       Thank you for this referral.  HAILEY HOFFMAN, SPT      A licensed physical therapist was present during all patient care.  I agree with the contents of this note.   Siegrune H. Dareen Piano, PT, DPT

## 2023-05-28 NOTE — Plan of Care (Signed)
 Problem: ABCDS Injury Assessment  Goal: Absence of physical injury  05/28/2023 1143 by Cecille Po, RN  Outcome: Progressing  05/28/2023 0037 by Campbell Lerner, LPN  Outcome: Progressing     Problem: Discharge Planning  Goal: Discharge to home or other facility with appropriate resources  Outcome: Progressing     Problem: Skin/Tissue Integrity  Goal: Skin integrity remains intact  Description: 1.  Monitor for areas of redness and/or skin breakdown  2.  Assess vascular access sites hourly  3.  Every 4-6 hours minimum:  Change oxygen saturation probe site  4.  Every 4-6 hours:  If on nasal continuous positive airway pressure, respiratory therapy assess nares and determine need for appliance change or resting period  05/28/2023 1143 by Cecille Po, RN  Outcome: Progressing  Flowsheets (Taken 05/28/2023 1054)  Skin Integrity Remains Intact: Monitor for areas of redness and/or skin breakdown  05/28/2023 0037 by Campbell Lerner, LPN  Outcome: Progressing     Problem: Pain  Goal: Verbalizes/displays adequate comfort level or baseline comfort level  05/28/2023 1143 by Cecille Po, RN  Outcome: Progressing  05/28/2023 0037 by Campbell Lerner, LPN  Outcome: Progressing     Problem: Safety - Adult  Goal: Free from fall injury  05/28/2023 0037 by Campbell Lerner, LPN  Outcome: Progressing     Problem: Confusion  Goal: Confusion, delirium, dementia, or psychosis is improved or at baseline  Description: INTERVENTIONS:  1. Assess for possible contributors to thought disturbance, including medications, impaired vision or hearing, underlying metabolic abnormalities, dehydration, psychiatric diagnoses, and notify attending LIP  2. Institute high risk fall precautions, as indicated  3. Provide frequent short contacts to provide reality reorientation, refocusing and direction  4. Decrease environmental stimuli, including noise as appropriate  5. Monitor and intervene to maintain adequate nutrition, hydration, elimination, sleep and  activity  6. If unable to ensure safety without constant attention obtain sitter and review sitter guidelines with assigned personnel  7. Initiate Psychosocial CNS and Spiritual Care consult, as indicated  Recent Flowsheet Documentation  Taken 05/28/2023 1054 by Cecille Po, RN  Effect of thought disturbance (confusion, delirium, dementia, or psychosis) are managed with adequate functional status: Institute high risk fall precautions, as indicated  05/28/2023 0037 by Campbell Lerner, LPN  Outcome: Progressing

## 2023-05-28 NOTE — Progress Notes (Signed)
 Chaplain Services  Unable to Visit Patient    Start Time: [phone]  End Time: [phone]    Volunteer Chaplain attempted to conduct a consultation and Spiritual Assessment for Tommy Brown, who is a 78 y.o.,male.  According to the patient's EMR Religious Affiliation is: [facility].     Patient is with staff and is not available to be assessed at this time.  Offered prayer remotely on patient's behalf.     Assessment:  Patient has no known religious/cultural needs that will affect patient's preferences in health care.  Patient has no known spiritual or religious issues which require intervention at this time.     Plan:  Chaplains will continue to follow and will provide pastoral care on an as needed/requested basis.  Volunteer Chaplain recommends bedside caregivers page Duty Chaplain if patient shows signs of acute spiritual or emotional distress.   Agricultural consultant charted for [employer].    Kordae Buon[employer]   Volunteer  Spiritual Care  [phone]

## 2023-05-28 NOTE — Progress Notes (Signed)
 PALLIATIVE CARE    []  INITIAL CONSULT ENCOUNTER [x]  FOLLOW UP ENCOUNTER     AVAILABLE: Monday to Friday From 8:00 AM to 4:30 PM at [phone] (Follow Prompts)   AFTER HOURS: Contact ATTENDING PHYSICIAN As Palliative Care Not Available     PATIENT INFORMATION     Name: Tommy Brown  MRN:   [id number]   DOB:   03/14/1945   Date/Time of Service:  05/28/2023 5:21 PM  Admission Date:  05/25/2023   Attending Physician: Lamont Dowdy, MD    Primary Care Physician: Cleatis Polka, MD     Palliative Care Consult   Palliative Care Consulted By: Dr Wenda Low  On: 05/25/23   For:  Goals of Care in setting of wife requesting palliative care consult , history of pancreatic cancer, new lesions on recent mri     PATIENT CAPACITY FOR HEALTHCARE DECISION MAKING    Code of [place]: Health Care Decisions Act   54.02-2981.2. Capacity ??Click to Access Code of [place]     [x]   Patient is presumed capable of making informed healthcare decisions with Full Capacity   []   Patient has been determined incapable of making informed healthcare decision(s):    []   No Capacity as determined by Attending Physician due to unconsciousness or profound impairment of consciousness.     []   No Capacity as determined by Attending Physician and Capacity Reviewer (Telepsych)    []   Patient's capability of making informed healthcare decision(s) needs to be determined:    []   Appears to have no capacity and discussed with Attending Physician for further evaluation    []   Capacity is unclear and discussed with Attending Physician for further evaluation    []   Capacity appears to be waxing and waning, discussed with Attending Physician for evaluation       AUTHORIZED DECISION MAKER   (if patient is currently, or in the future, incapable of making informed decisions)     [x]  Named in Advance Directive as Medical Power of Attorney (MPOA)   []  Court Appointed Legal Guardian   []  No Advance Directive and Decision Maker Identified per  [facility] Decisions Act Procedure in Absence of an Advance Directive:                               ??Click to Access Code of [place]    []  Spouse  or  []  Adult Child  []  Adult Children (2+)  or  []  Parent  []  Both Parents  or  []  Adult Sibling  []  Adult Siblings (2+)  or  []  Other Relative (in descending order of blood relationship)   []  Currently Unable to Identify any Decision Maker     ACP Navigator Updated:  [x]  Healthcare Decision Maker []  Patient Capacity      Decision Maker(s):    Primary Decision MakerFreida Busman [phone]    Secondary Decision Maker: Davis,Candace - Child - [phone]      PALLIATIVE GOALS OF CARE     Goals of Care Conversation with:    [x]  Patient with decision making capacity  []  Patient without capacity, but present    [x]  Authorized Decision Maker(s) as above  []  Other(s):   Others Present:   []  No  []  Yes:      []   Patient has No or Undetermined Capacity for Healthcare Decision Making and patient's    previously expressed values  and wishes (per prior advanced directive(s) or prior medical records/history) have been discussed and shared with authorized decision maker(s).      Current Code Status: DNR     Summary Of Conversation And Current Treatment Preferences:   DNR, durable DNR reviewed and signed with [name]  Per my 05/26/23 d/w [name]: he consistently confirmed his DNR CODE STATUS as a longstanding decision of his noting if his heart stops beating or he stopped breathing "I do not want any life support of any kind"    Continue current care, including planned orthopedic surgery 05/27/2023    1540-1550 [name] is AAOx4, in person bedside meeting with him and his wife Leta Jungling    They both confirm their prior discussion with Dr. Jodie Echevaria: [name] does not want to pursue any further cancer workup or cancer directed therapy and wants to pursue hospice at home but will see Dr. Jodie Echevaria one more time to get genetic testing done before hospice enrollment.   He confirms says he is going to rehab (at [facility]).     Given the above I introduced and recommended outpatient palliative care follow up with with [facility] and Supportive Care for additional support and ongoing goals of care discussions in the outpatient setting.  Mr. and Mrs. [name] are agreeable to pursuing outpatient palliative care in the interim while all of the above are occurring before they enroll in hospice, I updated CM and OP PC IDT    Goals of care established. Palliative care will sign off. Please do not hesitate to contact us should you need our services in the future. Thank you for allowing [place] to participate in the care of Nayib Remer Sr and his family.       Hospice Eligibility:    []    Not Applicable      []  NOT Hospice Eligible    [x]   Hospice Eligible:    []   Hospice is NOT CONSISTENT with Goals of Care    [x]   Hospice is BEING CONSIDERED at this time, but not yet aligned with goals of care    []   Patient would like to enroll with Hospice as part of discharge plan    []  Hospice is BEING CONSIDERED at this time, but patient capacity for healthcare decision making needs determination.    []  MPOA/Decision maker would like to enroll patient with Hospice as part of discharge plan, as patient is determined to not have capacity for healthcare decisions as indicated above      Referrals To:   [x]  Outpatient Palliative Care 05/28/2023 []  Home Health  []  Remote Patient Monitoring  []  Hospice   []  None      Updates Provided via secure chat:   [x]   Attending Physician/Provider: Lamont Dowdy, MD    []   Other Physician/Provider (consultant):     [x]   Nursing: Curlene Labrum   [x]   Care Manager/Discharge Planner: Shanda Bumps   [x]   Other: [facility] and Supportive Care IDT & [facility] IDT      DISPOSITION PLAN     TBD    Per my 05/28/2023 discussion with Mr. and Mrs. [name]: They want to pursue rehab, they note that they are going to rehab at [facility], will want to pursue  outpatient follow-up with Dr. Jodie Echevaria for genetic testing before pursuing hospice at home    Per my 05/26/2023 discussion with Mr. and Mrs. [name]: Lived at home with her PTA, high functioning     []   Palliative care plan and/or goals of care continue to be addressed and additional follow up is planned during this hospitalization.     []   Palliative care plan and/or goals of care are established and Palliative Care Team will sign off at this time. Please re-consult should circumstances change or additional needs arise.      []   No barriers to discharge have been identified related to the palliative care plan.     [x]   Discharge as per Attending Physician's clinical judgment with discharge/disposition considerations as per Care Management.     []   Disposition with [facility] with [facility] Supportive Care team assisting with discharge/disposition considerations concurrent with Care Management.      PALLIATIVE CARE ASSESSMENT/PLAN     Mechanical fall with right femoral neck fracture  Right femur x-ray 05/25/2023 finding minimally displaced intertrochanteric right femoral neck fracture  Status post right trochanteric nailing by orthopedic surgery Dr. Renaldo Harrison on 05/27/2023  05/28/2023: Reports no postop pain, currently well-managed, has worked with both PT and OT today and is highly motivated to pursue therapy both inpatient and once discharged at rehab facility      Pancreatic cancer status post Whipple in 2006, chemotherapy and radiation with concern for recurrence:  CT abdomen pelvis 05/07/2023 finding mass in the body of the pancreas, consistent with recurrent tumor and was not not identified in prior exam 05/18/2022   CT abdomen pelvis on 09/09/2022 noting pancreas: Unremarkable, hepatobiliary: Numerous hepatic cysts  MRI abdomen 05/16/2023 finding numerous peripherally enhancing hepatic masses worrisome for metastatic disease progression, infection less likely; pancreatic body mass correlates with nonenhancing cystic lesion,  cystic pancreatic neoplasm versus cystic dilation of the pancreatic duct status post Whipple procedure, new from prior exam; multiple bilateral renal cysts  Per Dr. Leone Brand 05/28/2023 discussion with 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 Hazle Quant does not want to pursue diagnosis or treatment, wants comfort and palliative care; though is agreeable to genetic testing given his strong history of pancreatic cancer and plans to see him in the office to follow-up genetic testing and we will set him up for home hospice afterwards    Medical Debility/Frailty due to femoral neck fracture  PPS 50-60 % currently; 100% PTA    Prescription Monitoring Program Accessed on 05/26/2023  Tramadol 50mg  #40 x5d filled on 05/21/23, 50mg  #20 x7d filled on 05/16/22  Diazepam 5mg  #1 x1d filled on 05/16/23  Testosterone 100mg /ml #6 x84 days - #6 x42 days filled ~Q2-83mos on 05/13/23 - 11/28/21  Temazepam 15mg  #30 x30 days filled ~Q1-51mos on 04/21/23 - 06/18/21  Gabapentin 600mg  #360 x90d filled ~Q3-4 mos on 03/24/23 - 07/25/21  Pregabalin 100mg  #14 x7d filled on 05/30/22    Significant comorbidities/medical history impacting their prognosis and or medical management include: HTN; CKD 3; CAD status post CABG; gout; GIB with GI suspecting due to diverticular bleed 04/2022; nephrolithiasis status post lithotripsy, left JJ stent 2020 per urology; bilateral renal artery stenosis seen on CT abdomen pelvis 09/09/2022 with OP vascular surgery follow-up on 10/15/2022 with Dr. Sherril Croon noting no need for vascular intervention as asymptomatic without changes in hypertension or renal function      PRESCRIPTION MONITORING PROGRAM (PMP) ACCESSED       ??Click to Access PMP System      []  No record found  []  Reviewed 05/28/2023 [x]  Prior review during hospitalization.      ADVANCED DIRECTIVES     Advanced Directive for Healthcare and  Health Care Decision Maker   []   No Advance Directive    []  Completed with patient []  Patient  desires to complete []  Offered, but declined  []  Patient unable to complete due to capacity, as noted above   []  Advance Directive Completed During this Hospitalization    []  Medical Power of Attorney (designated individual noted above) []  Living Will   [x]   Prior Advanced Directive Located In EHR dated 02/01/1994 to be replaced with As You Wish advanced directive for MPOA redesignation completed on 05/26/2023 currently pending scan to epic       [x]  Medical Power of Attorney (dated 02/01/1994 to be replaced with As You Wish advanced directive for MPOA redesignation completed on 05/26/2023 currently pending scan to epic)    [x]  Living Will dated 02/01/1994    [x]  Prior advanced directive has been reviewed with patient for continued accuracy    [x]  Prior advanced directive has been reviewed for awareness with MPOA or Decision Maker      Durable Do Not Resuscitate Order (DDNR)   []   Prior DDNR found in EHR and reviewed with []  Patient []  MPOA or Decision Maker   [x]   DDNR Completed During this Hospitalization    [x]  DDNR signed by patient 05/26/2023 and is currently pending scan to epic   []  DDNR signed by MPOA/Decision Maker as patient is determined to have no capacity, per above   []   DDNR not completed      Physician Orders for Life-Sustaining Treatment (POLST)    []   Prior POLST Form Found in EHR Dated:     []  Prior POLST Form reviewed for continued accuracy with Patient and remains unchanged    []  Prior POLST Form updated to reflect changes in patient goals of care    []  Prior POLST Form has been reviewed for awareness with MPOA or Decision Maker   []   POLST Form Completed During this Hospitalization    []  POLST signed by patient  []  POLST signed by MPOA/Decision Maker as patient is determined to have no capacity, per above   []   POLST Form []  Reviewed, but not completed, or []  not reviewed, due to:       HISTORY OF PRESENT ILLNESS (HPI) AND PAST MEDICAL HISTORY (PMH)      Jakeel Starliper Sr is a 78 y.o. male with a past  medical history of:   HTN; CKD 3; CAD status post CABG; gout; GIB with GI suspecting due to diverticular bleed 04/2022; nephrolithiasis status post lithotripsy, left JJ stent 2020 per urology; bilateral renal artery stenosis seen on CT abdomen pelvis 09/09/2022 with OP vascular surgery follow-up on 10/15/2022 with Dr. Sherril Croon noting no need for vascular intervention as asymptomatic without changes in hypertension or renal function; pancreatic cancer status post Whipple in 2006, chemotherapy and radiation; dementia with behavioral disturbance    Olando Willems Sr was admitted on 05/25/2023:    For AMS status post ground-level fall 05/24/2023 with wife reporting has been getting progressively weaker over the past week    [name] is being treated for:   Mechanical fall with right femoral neck fracture with orthopedic surgery Dr. Renaldo Harrison planning right hip trochanteric nailing scheduled for 05/27/2023; concern for pancreatic cancer recurrence with CT abdomen pelvis 05/07/2023 finding mass in the body of the pancreas, consistent with recurrent tumor and was not identified in prior exam 05/18/2022      Palliative Medicine/Palliative Care History:   []   Reviewed prior palliative medicine/palliative care  history from:    []  [facility] []  [facility] []  Other:    [x]   No prior palliative medicine/palliative care history found in chart review 05/26/2023      SOCIAL HISTORY      Marital Status/Living Situation: Per my 05/26/2023 discussion with Mr. and Mrs. [name]: They both confirm they live together PTA, he was high functioning independent with ADLs and IADLs and was still working, rode his bicycle to and from work at [employer].  [name] and I reviewed and he confirmed his living will from 1995 remains current to his wishes today.  However [name] would like to change his advanced directive MPOA designation, we reviewed his document from 1995, he would like to name his wife Mrs. Lacie Draft his primary medical decision maker and  his daughter Ms. Candace Earlene Plater his secondary medical decision maker; we completed the As You Wish advance directive section 1 MPOA designation only today 05/26/2023.    Any spiritual / religious concerns: []  No []  Yes []  Referred/Followed by Pastoral Care    Spiritual Affiliation: [employer]    Psychological/Psychosocial Distress: []  None []  Mild []  Moderate []  Severe      Anticipatory Grief: []  Normal []  Maladaptive (social work/pastoral care aware or following)    Other personal, social, and/or family history:      Opioid Risk Tool (ORT)   []  Assessed on: May 28, 2023 []  Unable to assess due to:    MARK EACH BOX THAT APPLIES MALE MALE    Family History of Substance Abuse              Alcohol []  1 []  3            Illegal drugs []  2 []  3            Rx drugs []  4 []  4    Personal History of Substance Abuse              Alcohol []  3 []  3            Illegal drugs []  4 []  4            Rx drugs []  5 []  5    Age between 16-45 years []  1 []  1    History of Preadolescent Sexual Abuse []  3 []  0    Psychological Disease              ADD, OCD, Bipolar, Schizophrenia []  2 []  1            Depression []  1 []  1    Scoring Totals: N/A N/A    Risk Scoring Legend: 3 or lower indicates low risk for future opioid use disorder; 4 to 7 indicates moderate risk for opioid use disorder; 8 or higher indicates a high risk for opioid use disorder      REVIEW OF SYSTEMS      Review of Systems   Constitutional:         Denies pain   Respiratory:  Negative for shortness of breath.    Cardiovascular:  Negative for chest pain.   Gastrointestinal:  Negative for abdominal pain, nausea and vomiting.          Pain Assessment   []   Pain is an active problem for patient    Pain level: []  None []  Mild (1 - 3) []  Moderate (4 - 6) []  Severe (7 - 10) []  Other:     Pain is exacerbated by:  Pain is improved with:       FUNCTIONAL ASSESSMENTS      Palliative Performance Scale Assessed by me on: May 28, 2023    % AMBULATION ACTIVITY DISEASE  SELF-CARE INTAKE  CONSCIOUS LEVEL   []   100 Full Normal None Full Normal Full   []   90 Full Normal  Some  Full Normal Full   []   80 Full Normal with Effort Some  Full Normal or Reduced Full   []   70 Reduced Can't Perform  Normal Work Significant  Full Normal or Reduced Full   [x]   60 Reduced Can't do Hobbies  or Housework Significant  Occasional Assistance Normal or Reduced Full or Confusion   [x]   50 Mainly Sit/Lie No Job or Work Extensive  Considerable Assistance Normal or Reduced Full or Confusion   []   40 Mainly in Bed Little Activity Extensive  Mainly Assistance Normal or Reduced Full or Drowsy +/- Confusion   []   30 Bed Bound No activity Extensive  Total Care Reduced Full or Drowsy +/- Confusion   []   20 Bed Bound No activity Extensive  Total Care Minimal Full or Drowsy +/- Confusion   []   10 Bed Bound No activity Extensive  Total Care Mouth Care Drowsy or Coma   []   0 Death -- -- -- -- --    Prior PPS: 100%  Date: PTA      FAST Scale in Alzheimer's Dementia  []  Assessed by me on: May 28, 2023 or [x]  N/A    # LEVEL OF DISABILITY    []   1  No difficulty    []   2  Subjective work difficulties.    []   3  Decreased organization capacity.    []   4  Decreased ability to perform complex tasks    []   5  Requires assistance in choosing proper clothing    []   6a  Improperly putting on clothes without assistance or cuing    []   6b  Difficulty adjusting bath-water temperature    []   6c  Inability to handle the mechanics of toileting    []   6d  Urinary Incontinence    []   6e  Fecal Incontinence    []   7a  Able to speak 6 or fewer intelligible words in a 24 hr period or during an interview    []   7b  Able to only speak one intelligible word in a 24 hr period or during an interview    []   7c  Cannot walk without assistance    []   7d  Cannot sit up without assistance    []   7e  Loss of ability to smile    []   55f  Loss of ability to hold head up independently    Prior FAST: N/A  Date: N/A     ECOG Performance Status  [x]  Assessed by me on:  May 28, 2023 or []  N/A     # ECOG PERFORMANCE STATUS    []   0 Fully active, able to carry on all pre-disease performance without restriction    []  1 Restricted in physically strenuous activity but ambulatory and able to carry out work of a light or sedentary nature, e.g., light house work, office work    []   2 Ambulatory and capable of all selfcare but unable to carry out any work activities; up and about more than 50% of waking hours    []   3 Capable of only  limited selfcare; confined to bed or chair more than 50% of waking hours    [x]   4 Completely disabled; cannot carry on any selfcare; totally confined to bed or chair    []   5 Death    Prior ECOG: 0  Date: PTA      PHYSICAL ASSESSMENT     Vitals:    05/28/23 1257   BP: 95/67   Pulse: 93   Resp: 18   Temp: 97.5 F (36.4 C)   SpO2: 98%     Body mass index is 20.81 kg/m.  Wt Readings from Last 3 Encounters:   05/27/23 65.8 kg (145 lb 1 oz)   05/05/23 65.8 kg (145 lb)   09/09/22 68 kg (150 lb)         Last BM (including prior to admit): 05/27/23      Supplemental O2:  []  Yes  [x]  NO   Currently this patient has:   [x]  Peripheral IV  []  PICC  []  PORT []  AICD    []  Foley Catheter []  NG Tube  []  PEG Tube []  PPM    []  Rectal Tube  []  Drain []  Other:     Physical Exam  Vitals reviewed.   Constitutional:       General: He is awake. He is not in acute distress.     Appearance: Normal appearance. He is underweight. He is not toxic-appearing.   HENT:      Head: Normocephalic and atraumatic.      Mouth/Throat:      Lips: Pink.      Mouth: Mucous membranes are moist.   Cardiovascular:      Rate and Rhythm: Normal rate and regular rhythm.      Heart sounds: Normal heart sounds, S1 normal and S2 normal.   Pulmonary:      Effort: Pulmonary effort is normal. No tachypnea, bradypnea, accessory muscle usage or respiratory distress.      Breath sounds: Normal breath sounds.   Abdominal:      General: Abdomen is flat. Bowel sounds are normal. There is no distension.       Palpations: Abdomen is soft.      Tenderness: There is no abdominal tenderness.   Musculoskeletal:      Right lower leg: No edema.      Left lower leg: No edema.   Skin:     General: Skin is warm and dry.      Capillary Refill: Capillary refill takes less than 2 seconds.   Neurological:      Mental Status: He is alert and oriented to person, place, and time.   Psychiatric:         Behavior: Behavior is cooperative.           TOTAL TIME SPENT E/M     [x]    The total E/M encounter time was 28  minutes on 05/28/2023, which was spent performing a face-to-face encounter and personally completing the provider-level activities documented in the note as checked below.. This includes time spent prior to the visit and after the visit in direct care of the patient. This time does not include time spent in any separately reportable services.    TIME SPENT:    [x]   Preparing to see patient: Review of tests, laboratory, imaging and other EHR medical records    []   Review of Prescription Monitoring Program (PMP)    [x]   Obtaining and/or reviewing separately obtained history    [x]   Performing a medically appropriate examination and/or evaluation    [x]   Counseling and educating the patient/family/caregiver    [x]   Ordering: [x]  OP PC consult []  Tests []  Procedures    [x]   Independently interpreting results    [x]   Care coordination by speaking with other health care professionals:     [x]  Physicians  [x]  Nursing  []  SLP/PT/OT  []  RD  [x]  Care Mgmt  [x]  [facility] and Supportive Care IDT, OP PC IDT    [x]   Documenting clinical information in the electronic or other health record     [x]  ELECTRONICALLY SIGNED 05/28/2023 at 5:21 PM      Dictation disclaimer: Additionally, portions of this document may have been dictated using Scientist, clinical (histocompatibility and immunogenetics). Variances in spelling and vocabulary are possible and unintentional. Every attempt is made to minimize errors and discrepancies. Please notify the There[facility] Parkin if any discrepancies  are noted or if the meaning of any statement is not clear     Everlene Other, APRN - NP  Palliative Care  o. [phone]

## 2023-05-28 NOTE — Progress Notes (Signed)
 Patient: Tommy Brown     78 y.o. male  Date of Admission:  05/25/2023    Dispo: deferred to ortho ?SNF tomorrow    Assessment   Minimally displaced acute intertrochanteric right femoral neck fracture.   S/p fall  Acute metabolic encephalopathy, improved  Leukocytosis   NSVT  Multivessel CAD status post CABG in 1995. Cardiac catheterization in 2006 revealed patent bypass grafts. 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 VT today. Bowel regime  PT recs SNF, possibly tomorrow - ICM consulted  Cardio has seen preop, follow up outpatient recommended  Repleted Mg.   Mental status, improved  Wbc, improved. Afebrile, no definite signs of infection, hold on abx at this time. Following culture data. UA / CXR neg  Oncology Dr Jodie Echevaria has seen, follow up outpatient recommended. Patient does not want treatment. Plan is comfort, palliative care. Needs genetic testing in the office then plans for hospice.   Cont meds as ordered    DVT ppx: heparin  Electrolytes: repleted Mg and recheck improved    I have reviewed and ordered pertinent labs and radiological studies, reconciled medications, and reviewed medical records/notes.    I have discussed with patient and nursing staff.    Patient's overall condition: stable     A total of 35 minutes was spent on this follow-up patient encounter for medical problems as outlined above, which included obtaining and reviewing history,  reviewing labs/testing/imaging ordered at prior visit, performing exam, discussing treatment plan, ordering additional studies, and documenting in the record.       Current Inpatient Meds and Allergies:    Scheduled Meds:  sodium chloride flush, 5-40 mL, 2 times per day  sennosides-docusate sodium, 1 tablet, BID  gabapentin, 600 mg, 4x daily  heparin (porcine), 5,000 Units, BID  timolol, 1 drop, Nightly  allopurinol, 300 mg, Daily  ferrous sulfate, 325 mg, Daily with breakfast  tamsulosin, 0.8 mg, Daily  aspirin, 81 mg, Daily  omeprazole, 40 mg, Daily  therapeutic multivitamin-minerals, 1 tablet, Daily  magnesium oxide, 400 mg, Daily      Drips:   lactated ringers, Last Rate: 25 mL/hr at 05/27/23 1859  sodium chloride          Allergies:  Allergies   Allergen Reactions    Codeine Itching and Swelling    Oxycodone Anaphylaxis    Amoxicillin Diarrhea    Hydroxyzine Pamoate Itching and Other (See Comments)     Other reaction(s): Unknown    Meperidine Itching, Other (See Comments), Palpitations and Swelling     Other reaction(s): Unknown    Metoclopramide Itching, Other (See Comments) and Swelling     Other reaction(s): Other (See Comments)  Reaction unknown   Reaction unknown     Morphine Nausea And Vomiting and Other (See Comments)    Oxycodone-Acetaminophen Itching    Prochlorperazine Itching and Other (See Comments)     Other reaction(s): Unknown (comments)  Other reaction(s): Unknown  Unsure of reaction    Amoxicillin Diarrhea    Codeine Itching and Swelling    Hydroxyzine Hcl Hives    Hydroxyzine Pamoate Itching    Metoclopramide Itching and Swelling    Other Other (See Comments)     VISTAID  ANESTHESIA    Other      VISTAID ANESTHESIA    Oxycodone-Acetaminophen Itching    Prochlorperazine Itching    Meperidine Itching, Swelling and Palpitations    Morphine Nausea And Vomiting       Subjective:      Chief Complaint: Altered Mental Status and Fall      No new complaints. Feeling ok. Had foley removed today, not yet  voided.   Pain controlled, no nausea, no BM        Objective:      Physical Exam:   BP 95/67   Pulse 93   Temp 97.5 F (36.4 C) (Temporal)   Resp 18   Ht 1.778 m (5\' 10" )   Wt 65.8 kg (145 lb 1 oz)   SpO2 98%   BMI 20.81 kg/m   Gen: Well-nourished, well-developed, in no apparent distress, upright in bed  Cardio: RRR, S1-S2  Pulm: Coarse, no wheezing, no cough  GI: Soft, nontender, nondistended, normoactive bowel sounds  Extremities: +edema, see ortho note for details  Skin: Clear, dry, intact anteriorly, see nursing notes for posterior details  Psych: Alert and awake        Labwork and Ancillary Studies:    Clinical laboratory tests, radiology, imaging, other test results, and medications were individually reviewed.    CBC w/Diff   Recent Labs     05/25/23  2026 05/26/23  0108 05/28/23  0414   WBC 14.6* 13.0* 8.9   RBC 4.33 3.64* 3.30*   HCT 40.4 34.3* 31.0*   MCV 93.3 94.2 93.9   MCH 29.8 29.9 29.7   MCHC 31.9 31.8 31.6   RDW 49.0* 49.0* 48.5*   MPV 10.4* 10.8* 11.4*      Recent Labs     05/25/23  2026 05/26/23  0108 05/28/23  0414   MONOS 8.0 8.1  --    BASOS 0.1 0.1  --    RDW 49.0* 49.0* 48.5*       Comprehensive Metabolic Profile   Recent Labs     05/25/23  2026 05/26/23  0108   NA 131* 135*   CO2 24 19*   ANIONGAP 11 13   BUN 13 12   GLUCOSE 128* 98      Recent Labs     05/25/23  2026 05/26/23  0108   CALCIUM 9.0 7.2*   ALBUMIN 2.6*  --    ALKPHOS 148*  --        XR PELVIS (1-2 VIEWS)  Addendum Date: 05/25/2023  Lucency overlying right greater trochanter and extending towards the lesser trochanter is felt to represent a comminuted mildly displaced acute intertrochanteric fracture. Electronically signed by: Earline Mayotte, MD 05/25/2023 11:54 PM EDT          Workstation ID: [id number]     Result Date: 05/25/2023  IMPRESSION:  No fracture. Electronically signed by: Earline Mayotte, MD 05/25/2023 10:11 PM EDT          Workstation ID: [id number]     XR FEMUR RIGHT (MIN 2 VIEWS)  Result Date:  05/25/2023  IMPRESSION:  Minimally displaced intertrochanteric right femoral neck fracture.  Electronically signed by: Earline Mayotte, MD 05/25/2023 11:52 PM EDT  Workstation ID: [id number]     CT HEAD WO CONTRAST  Result Date: 05/25/2023  Impression: Chronic microvascular ischemic changes. Electronically signed by: Earline Mayotte, MD 05/25/2023 10:42 PM EDT          Workstation ID: [id number]     CT CERVICAL SPINE WO CONTRAST  Result Date: 05/25/2023  IMPRESSION: No acute intracranial abnormality. No acute fractures or dislocation in the cervical spine. Electronically signed by: Consuello Masse, MD 05/25/2023 10:37 PM EDT          Workstation ID: [id number]     XR CHEST PORTABLE  Result Date: 05/25/2023  IMPRESSION:  No acute disease. Electronically signed by: Earline Mayotte, MD 05/25/2023 10:12 PM EDT          Workstation ID: [id number]     XR FEMUR LEFT (MIN 2 VIEWS)  Result Date: 05/25/2023  IMPRESSION:  No fracture. Electronically signed by: Earline Mayotte, MD 05/25/2023 10:10 PM EDT          Workstation ID: [id number]         Note:  This was dictated using a computer transcription program.  Although proofread, it may contain computer transcription errors or phonetic errors.  Other human proofreading errors may exist.  Corrections may be performed at a later time.  If there is any question regarding the details of this note, please feel free to contact me directly.             Lamont Dowdy, M.D.   Division of Hospital Medicine  [facility]  Please epic chat between 7am and 5pm or page the dedicated pager after hours.

## 2023-05-28 NOTE — Progress Notes (Signed)
 [facility]    PROGRESS NOTE    Patient seen in follow-up for:   Preoperative risk assessment, coronary disease, aortic stenosis    PLAN:  Hip fracture:  Status post trochanteric nail yesterday.  Doing well out of bed to chair today.  CAD: Stable, no angina.  Remains on aspirin 81 mg daily.  Not yet on beta-blocker postoperatively.  Aortic stenosis: Will follow-up in office.  Suspected pancreatic cancer recurrence: Management per oncology.  Disposition: Pending transfer to SNF.  Cardiac issues are stable.  Will sign off and be available.      ASSESSMENT:  Tommy Brown is a 78 y.o. male with:  Fall  - Patient presented to [facility] ER 05/25/2023 with c/o fall day prior when going to bathroom. Endorsed R hip pain.   - WBC 14.6   R hip fracture   - R femur Xray 05/25/2023: Minimally displaced intertrochanteric right femoral neck fracture.   CAD              -s/p CABG x 3 in 1990's in [facility] medical [facility]              - hs troponin 22 >25  Moderate Aortic stenosis  HTN  BPH  Hx of pancreatic cancer              - s/p Whipple  Diverticular disease  Hyponatremia              - Na 131 > 135   Hypomagnesemia              - Mg 1.5         Cardiac testing:   NST 06/2019: no evidence of reversible ischemia, low risk.   Echo 05/09/2022: EF 55%. Mod AS, peak 2.6 m/s, mean 15 m Hg, AVA 0.9 cm2, DI 0.30, mild MR, RVSP 22 mm Hg. Normal LVSVi  Echo 05/20/2022: EF 49% . Mod AS.peak gradient of , mean gradient of 14 mmHg, AVA of 0.54cm2, and Dimensionless index 0.33.      Principal Problem:    Encephalopathy  Active Problems:    Closed intertrochanteric fracture of hip, right, initial encounter  Resolved Problems:    * No resolved hospital problems. *      SUBJECTIVE:  Feels better.  Out of bed to chair.  No chest pain.    Hospital Problems           Last Modified POA    * (Principal) Encephalopathy 05/25/2023 Yes    Closed intertrochanteric fracture of hip, right, initial encounter 05/26/2023 Yes        VS: Blood pressure 136/82, pulse 85, temperature 98.1 F (36.7 C), temperature source Temporal, resp. rate 18, height 1.778 m (5\' 10" ), weight 65.8 kg (145 lb 1 oz), SpO2 97%.  Wt Readings from Last 3 Encounters:   05/27/23 65.8 kg (145 lb 1 oz)   05/05/23 65.8 kg (145 lb)   09/09/22 68 kg (150 lb)      Body mass index is 20.81 kg/m.     Intake/Output Summary (Last 24 hours) at 05/28/2023 1610  Last data filed at 05/28/2023 0400  Gross per 24 hour   Intake 1665.42 ml   Output 1425 ml   Net 240.42 ml       TELE personally reviewed by me: Sinus rhythm    EXAM:  General:  No acute distress  Eyes: EOMI  Neck: JVD is absent  Lungs: clear, no rales, no rhonchi  Cardiac:  Regular Rhythm, 3/6 mid to late peaking SEM  Abdomen: soft, non-tender,  Ext: Edema is absent  Neuro: Awake and interactive    Labs:  CMP:   Recent Labs     05/25/23  2026 05/26/23  0108   NA 131* 135*   K 5.1 3.8   CL 96* 103   CO2 24 19*   BUN 13 12   CREATININE 1.10 0.88   GLUCOSE 128* 98   CALCIUM 9.0 7.2*   BILITOT 0.80  --    ALKPHOS 148*  --    AST 27.0  --    ALT 15  --         CBC:  Recent Labs     05/25/23  2026 05/26/23  0108 05/28/23  0414   WBC 14.6* 13.0* 8.9   RBC 4.33 3.64* 3.30*   HGB 12.9* 10.9* 9.8*   HCT 40.4 34.3* 31.0*   MCV 93.3 94.2 93.9   MCH 29.8 29.9 29.7   MCHC 31.9 31.8 31.6   RDW 49.0* 49.0* 48.5*   PLT 285 245 271   MPV 10.4* 10.8* 11.4*        HS-Troponin:  Lab Results   Component Value Date/Time    TROPHS 25 05/25/2023 10:40 PM    TROPHS 22 05/25/2023 08:26 PM    TROPHS 16 09/09/2022 04:52 AM    TROPHS 14 09/09/2022 01:28 AM    TROPHS 16 09/08/2022 10:25 PM         Medications:  Current Facility-Administered Medications   Medication Dose Route Frequency Provider Last Rate Last Admin    lactated ringers infusion   IntraVENous Continuous Arita Miss, MD 25 mL/hr at 05/27/23 1859 New Bag at 05/27/23 1859    sodium chloride flush 0.9 % injection 5-40 mL  5-40 mL IntraVENous 2 times per day Arita Miss, MD   10 mL at  05/27/23 2131    sodium chloride flush 0.9 % injection 5-40 mL  5-40 mL IntraVENous PRN Arita Miss, MD        0.9 % sodium chloride infusion   IntraVENous PRN Arita Miss, MD        ceFAZolin (ANCEF) 2000 mg in dextrose 4 % 100 mL IVPB (premix)  2,000 mg IntraVENous Q8H Arita Miss, MD   Stopped at 05/28/23 [phone]    HYDROmorphone (DILAUDID) injection 0.5 mg  0.5 mg IntraVENous Q3H PRN Arita Miss, MD        ondansetron (ZOFRAN-ODT) disintegrating tablet 4 mg  4 mg Oral Q8H PRN Arita Miss, MD        Or    ondansetron [facility]) injection 4 mg  4 mg IntraVENous Q6H PRN Arita Miss, MD        sennosides-docusate sodium (SENOKOT-S) 8.6-50 MG tablet 1 tablet  1 tablet Oral BID Arita Miss, MD   1 tablet at 05/27/23 2131    bisacodyl (DULCOLAX) EC tablet 5 mg  5 mg Oral Daily PRN Arita Miss, MD        polyethylene glycol (GLYCOLAX) packet 17 g  17 g Oral Daily PRN Arita Miss, MD        traMADol Janean Sark) tablet 50 mg  50 mg Oral Q6H PRN Arita Miss, MD        gabapentin (NEURONTIN) capsule 600 mg  600 mg Oral 4x daily Arita Miss, MD   600 mg at 05/27/23 2131    senna (  SENOKOT) tablet 8.6 mg  1 tablet Oral Daily PRN Arita Miss, MD        docusate sodium (COLACE) capsule 100 mg  100 mg Oral BID PRN Arita Miss, MD        ondansetron [facility]) injection 4 mg  4 mg IntraVENous Q6H PRN Arita Miss, MD        acetaminophen (TYLENOL) tablet 650 mg  650 mg Oral Q6H PRN Arita Miss, MD        melatonin tablet 3 mg  3 mg Oral Nightly PRN Arita Miss, MD        naloxone [facility]) injection 0.4 mg  0.4 mg IntraVENous PRN Arita Miss, MD        glucagon injection 1 mg  1 mg IntraMUSCular PRN Arita Miss, MD        dextrose 50 % IV solution  20-30 mL IntraVENous PRN Arita Miss, MD        acetaminophen (OFIRMEV) infusion 1,000 mg  1,000 mg IntraVENous Q6H PRN Arita Miss, MD   Stopped at 05/27/23 1925    heparin (porcine) injection 5,000 Units  5,000 Units SubCUTAneous BID Arita Miss, MD    5,000 Units at 05/27/23 2131    ketorolac (TORADOL) injection 15 mg  15 mg IntraVENous Q6H PRN Arita Miss, MD   15 mg at 05/28/23 1478    timolol (TIMOPTIC) 0.5 % ophthalmic solution 1 drop  1 drop Left Eye Nightly Arita Miss, MD   1 drop at 05/27/23 2131    allopurinol (ZYLOPRIM) tablet 300 mg  300 mg Oral Daily Arita Miss, MD   300 mg at 05/27/23 1032    ferrous sulfate (IRON 325) tablet 325 mg  325 mg Oral Daily with breakfast Arita Miss, MD   325 mg at 05/27/23 1040    tamsulosin (FLOMAX) capsule 0.8 mg  0.8 mg Oral Daily Arita Miss, MD   0.8 mg at 05/27/23 1030    methocarbamol (ROBAXIN) tablet 500 mg  500 mg Oral TID PRN Arita Miss, MD   500 mg at 05/28/23 2956    aspirin chewable tablet 81 mg  81 mg Oral Daily Arita Miss, MD   81 mg at 05/26/23 0843    omeprazole (PRILOSEC) delayed release capsule 40 mg  40 mg Oral Daily Arita Miss, MD   40 mg at 05/27/23 1032    therapeutic multivitamin-minerals 1 tablet  1 tablet Oral Daily Arita Miss, MD   1 tablet at 05/27/23 1030    magnesium oxide (MAG-OX) tablet 400 mg  400 mg Oral Daily Arita Miss, MD   400 mg at 05/27/23 1030        Kandis Mannan, [facility]  [facility]  [facility]    May 28, 2023  9:17 AM

## 2023-05-28 NOTE — Consults (Signed)
 [facility]  CONSULTATION REPORT  NAME:  Tommy Brown, Tommy Brown  SEX:   M  ADMIT: 05/25/2023  DATE OF CONSULT: 05/28/2023  REFERRING PHYSICIAN:   DOB: 13-Dec-1945  MR#    [id number]  ROOM:  2115  ACCT#  [id number]    cc: Leonides Sake M.D.;   Lyna Poser MD;   Wendall Stade MD      HISTORY OF PRESENT ILLNESS:  The patient is a 78 year old male who had a history of pancreatic cancer in 2012 and treated in [place]. He underwent Whipple surgery and was given adjuvant chemotherapy and radiation afterwards. The patient has been doing well since then.     More recently, the patient has been starting to have abdominal pain and weight loss. CAT scan of the abdomen and pelvis on 05/06/2021 showed a pancreatic body mass measuring 3.2 cm with multiple liver cysts. The patient is known to have multiple liver cysts from before.     An MRI of the abdomen on 05/16/2023 showed a pancreatic mass measuring 2.4 cm with multiple liver cysts, with areas of new liver lesions consistent with new liver metastasis.     The patient was supposed to see me in the office yesterday. Unfortunately, he had an accidental fall and had a right femoral neck fracture. The patient just underwent right hip surgery for ORIF on 05/27/2023.     The patient is now referred for further evaluation and treatment.     REVIEW OF SYSTEMS:  The patient has weakness, fatigue, decreased appetite, weight loss. No headaches, dizziness, diplopia. No mouth sores or dysphagia. No hemoptysis, pleurisy, or wheezing. No nausea or vomiting. Has some abdominal pain. No rectal bleeding. No melena, no hematemesis. No dysuria, hematuria, no incontinence. The patient just had surgery on his right hip. He has mild discomfort and pain from that with some limitation of movement. No seizure, no delusion, no hallucinations.     PAST MEDICAL HISTORY:  Hypogonadism, coronary artery disease, vitamin D deficiency, dyslipidemia, gout, hypertension, GI bleeding secondary  to diverticulosis in 2022, liver cysts.     PAST SURGICAL HISTORY:  CABG, Whipple surgery.     SOCIAL HISTORY:   The patient is a nonsmoker. No alcohol abuse.      FAMILY HISTORY:   Had 2 brothers who died of pancreatic cancer.      CURRENT MEDICATIONS:  Allopurinol, aspirin, ferrous sulfate, Neurontin, subcutaneous heparin, magnesium oxide, Prilosec, Senokot, Flomax, Timoptic.     PHYSICAL EXAMINATION:  GENERAL APPEARANCE: Elderly male, somewhat frail-looking, petite, not in respiratory distress.  HEENT: PERRLA, anicteric sclerae. No oral thrush, no mucositis.  NECK: No palpable cervical or supraclavicular lymph nodes.  CHEST AND LUNGS: Clear breath sounds. No rales or wheezing.  HEART: S1, S2. No murmur.  ABDOMEN: Soft, nontender. No hepatosplenomegaly.  EXTREMITIES: No pedal edema. Right hip incision site with dressing and dressing is dry.  NEUROLOGIC: No focal deficits.     PERTINENT LABORATORY:  Sodium 135, potassium 3.8, BUN of 12, creatinine 0.9, WBC 8.9, hemoglobin 9.8, hematocrit 31, platelets 271,000.     ASSESSMENT:  A 78 year old male with probable metastatic pancreatic cancer with liver metastasis. He has a traumatic right hip fracture and had open reduction internal fixation (ORIF) done.     PLAN:  1. Continue postop care and physical therapy.  2. We discussed about his likely diagnosis of pancreatic cancer with liver metastasis. The patient does not want any treatment. He does not want to pursue diagnosis or treatment.  He does want just comfort and palliative care.  3. The patient has a very strong history of pancreatic cancer. He will need genetic testing and he is agreeable to that.  4. We will plan to see him in the office for followup in order to obtain genetic testing and then after that set him up for home hospice.     Thank you for requesting a consultation on this patient.          ___________________  Catalina Antigua M.D.   Dictated IO:[id number] D. Jodie Echevaria, M.D.  SA  D: 05/28/2023 08:30:42  T:  05/28/2023 08:59:13  [id number]

## 2023-05-28 NOTE — Care Coordination-Inpatient (Signed)
 [facility]   Care Management Initial Assessment        Home/Support/Situation prior to admission: Home with spouse      List DME prior to admission: N/A         Home address, phone number, insurance, emergency contacts verified:  Yes        PCP Confirmed Active: Yes                Dialysis patient?  No      Active Medicaid?  No               Is patient planning to go straight LTC at DC?  No     Current family/patient discharge plan or goals of care: SNF- [facility]          Agreeable to [facility] and Supportive Care if [facility] is recommended at DC (list alternate agency if agreeable to outside agency):  No        Agreeable to post-acute care facility if recommended at DC? Yes     Permission received from patient to load them into Epic for home health and/or post-acute care facility? Yes    Signed:   Cyril Loosen , Case Manager  May 28, 2023  2:07 PM  Department Phone: [phone]        05/28/23 1405   Service Assessment   Patient Orientation Alert and Oriented   Cognition Alert   History Provided By Patient;Spouse   Primary Caregiver Self   Accompanied By/Relationship Lacie Draft- spouse   Support Systems Spouse/Significant Other   Patient's Healthcare Decision Maker is: Legal Next of Kin   PCP Verified by CM Yes   Last Visit to PCP Within last year   Prior Functional Level Independent in ADLs/IADLs   Current Functional Level Independent in ADLs/IADLs   Can patient return to prior living arrangement Yes   Ability to make needs known: Good   Family able to assist with home care needs: Yes   Would you like for me to discuss the discharge plan with any other family members/significant others, and if so, who? Yes   Financial Resources [employer] None   CM/SW Referral ADLs/IADLs;Adult day services   Social/Functional History   Lives With Spouse   Type of Home Apartment   Home Layout One level   Home Access Ramped entrance   Bathroom Equipment None   Bathroom  Accessibility Accessible   Home Equipment None   Receives Help From Family   Prior Level of Assist for ADLs Independent   Prior Level of Assist for [employer] Independent   Ambulation Assistance Independent   Prior Level of Assist for Transfers [employer] fast streamer No   Discharge Planning   Type of Residence Apartment   Living Arrangements Spouse/Significant Other   Current Services Prior To Admission None   Potential Assistance Needed Skilled Nursing Facility   DME Ordered? No   Potential Assistance Purchasing Medications No   Type of Home Care Services None   Patient expects to be discharged to: Skilled nursing facility   Services At/After Discharge   Transition of Care Consult (CM Consult) SNF   Services At/After Discharge Skilled Nursing Facility (SNF)   [facility] Information Provided? No   Mode of Transport at Discharge BLS   Confirm Follow Up Transport Family

## 2023-05-29 MED ORDER — SENNA-DOCUSATE SODIUM 8.6-50 MG PO TABS
8.6-50 | ORAL_TABLET | Freq: Two times a day (BID) | ORAL | 0 refills | Status: AC | PRN
Start: 2023-05-29 — End: ?

## 2023-05-29 MED ORDER — METOPROLOL SUCCINATE ER 25 MG PO TB24
25 | ORAL_TABLET | Freq: Every day | ORAL | 0 refills | Status: AC
Start: 2023-05-29 — End: ?

## 2023-05-29 MED ORDER — DSS 100 MG PO CAPS
100 | ORAL_CAPSULE | Freq: Two times a day (BID) | ORAL | 0 refills | Status: AC | PRN
Start: 2023-05-29 — End: ?

## 2023-05-29 MED ORDER — SENNA-DOCUSATE SODIUM 8.6-50 MG PO TABS
8.6-50 MG | Freq: Two times a day (BID) | ORAL | Status: DC
Start: 2023-05-29 — End: 2023-05-30
  Administered 2023-05-30 (×2): 2 via ORAL

## 2023-05-29 MED ORDER — GABAPENTIN 600 MG PO TABS
600 | ORAL_TABLET | Freq: Four times a day (QID) | ORAL | 0 refills | Status: AC
Start: 2023-05-29 — End: 2023-06-28

## 2023-05-29 MED ORDER — METOPROLOL SUCCINATE ER 25 MG PO TB24
25 MG | Freq: Every day | ORAL | Status: DC
Start: 2023-05-29 — End: 2023-05-30
  Administered 2023-05-29 – 2023-05-30 (×2): 25 mg via ORAL

## 2023-05-29 MED ORDER — POLYETHYLENE GLYCOL 3350 17 G PO PACK
17 | PACK | Freq: Every day | ORAL | 0 refills | Status: AC
Start: 2023-05-29 — End: 2023-06-29

## 2023-05-29 MED ORDER — BISACODYL 5 MG PO TBEC
5 | ORAL_TABLET | Freq: Every day | ORAL | 0 refills | Status: AC | PRN
Start: 2023-05-29 — End: ?

## 2023-05-29 MED FILL — METHOCARBAMOL 500 MG PO TABS: 500 MG | ORAL | Qty: 1 | Fill #0

## 2023-05-29 MED FILL — OMEPRAZOLE 20 MG PO CPDR: 20 MG | ORAL | Qty: 2 | Fill #0

## 2023-05-29 MED FILL — MAGNESIUM OXIDE -MG SUPPLEMENT 400 (240 MG) MG PO TABS: 400 (240 Mg) MG | ORAL | Qty: 1 | Fill #0

## 2023-05-29 MED FILL — TRAMADOL HCL 50 MG PO TABS: 50 MG | ORAL | Qty: 1 | Fill #0

## 2023-05-29 MED FILL — HEPARIN SODIUM (PORCINE) 5000 UNIT/ML IJ SOLN: 5000 UNIT/ML | INTRAMUSCULAR | Qty: 1 | Fill #0

## 2023-05-29 MED FILL — SODIUM CHLORIDE FLUSH 0.9 % IV SOLN: 0.9 % | INTRAVENOUS | Qty: 10 | Fill #0

## 2023-05-29 MED FILL — GABAPENTIN 300 MG PO CAPS: 300 MG | ORAL | Qty: 2 | Fill #0

## 2023-05-29 MED FILL — THERAPEUTIC-M PO TABS: ORAL | Qty: 1 | Fill #0

## 2023-05-29 MED FILL — SENNOSIDES-DOCUSATE SODIUM 8.6-50 MG PO TABS: 8.6-50 MG | ORAL | Qty: 1 | Fill #0

## 2023-05-29 MED FILL — FEROSUL 325 (65 FE) MG PO TABS: 325 (65 Fe) MG | ORAL | Qty: 1 | Fill #0

## 2023-05-29 MED FILL — METOPROLOL SUCCINATE ER 25 MG PO TB24: 25 MG | ORAL | Qty: 1 | Fill #0

## 2023-05-29 MED FILL — ASPIRIN 81 MG PO CHEW: 81 MG | ORAL | Qty: 1 | Fill #0

## 2023-05-29 MED FILL — ALLOPURINOL 300 MG PO TABS: 300 MG | ORAL | Qty: 1 | Fill #0

## 2023-05-29 MED FILL — KETOROLAC TROMETHAMINE 15 MG/ML IJ SOLN: 15 MG/ML | INTRAMUSCULAR | Qty: 1 | Fill #0

## 2023-05-29 MED FILL — TAMSULOSIN HCL 0.4 MG PO CAPS: 0.4 MG | ORAL | Qty: 2 | Fill #0

## 2023-05-29 MED FILL — POLYETHYLENE GLYCOL 3350 17 G PO PACK: 17 g | ORAL | Qty: 1 | Fill #0

## 2023-05-29 NOTE — Progress Notes (Signed)
 [facility]   Care Management Update    Patient Name: Tommy Brown                       Unit/Room: 2115/2115  Admitting Diagnosis: Dehydration [E86.0]  Encephalopathy [G93.40]  Primary pancreatic cancer with metastasis to other site [facility]) [C25.9]  Acute metabolic encephalopathy [G93.41]  Ground-level fall [W18.30XA]  Closed intertrochanteric fracture of hip, right, initial encounter [S72.141A]   Length of stay: 3      Current Therapy/MD Recommendation for d/c:     SNF      Patient/Family Freedom of Choice D/C Plan:     Originally [facility] [facility], now requesting [facility]. Transport requested for tomorrow at 1200       Prior Auth Needed/Initiated for SNF:   no      Ordered or Anticipated DME for d/c:  n/a      Any special services or needs currently anticipated at D/C   i.e. dialysis, wound vac, IV abx, O2, med assist, SDOH interventions, etc.     N/a    Discharge Plan Updates:     Original plan for [facility] however patient and wife now requesting [facility] Mellody Dance with saber rounded on patient at bedside, [facility] bed available tomorrow. Transport scheduled for 1200 4/4      Signed:   Trilby Leaver , Case Manager  May 29, 2023  2:36 PM  Department Phone: [phone]

## 2023-05-29 NOTE — Progress Notes (Signed)
 Hematology / Oncology Progress Note      Patient: Tommy Brown  Gender: male   MRN: [id number]    Date of Birth: [birth year] Age: 78 y.o.   CSN: [id number]    LOS:  LOS: 3 days   Admit Date: 05/25/2023     PCP: Cleatis Polka, MD    Assessment:   Principal Problem:    Encephalopathy  Active Problems:    Closed intertrochanteric fracture of hip, right, initial encounter  Resolved Problems:    * No resolved hospital problems. *      Plan:   Right femoral fracture status post surgery.  Continue postop care and physical therapy.  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  No plan for biopsy or treatment.  Will set patient up for outpatient genetic testing after discharge.  I will sign of the case.    Subjective:   Patient is stable.  He is recovering from surgery.  Being set up to go home with therapy.    Objective:   BP 120/83   Pulse 69   Temp 99.1 F (37.3 C) (Temporal)   Resp 19   Ht 1.778 m (5\' 10" )   Wt 65.8 kg (145 lb 1 oz)   SpO2 95%   BMI 20.81 kg/m           Intake/Output Summary (Last 24 hours) at 05/29/2023 0909  Last data filed at 05/29/2023 0436  Gross per 24 hour   Intake 2102.08 ml   Output 1000 ml   Net 1102.08 ml       Review of Systems:   Constitutional: negative for fevers, chills, sweats and fatigue  Eyes: negative for visual disturbance, redness and icterus  Ears, Nose, Mouth, Throat, and Face: negative for tinnitus, epistaxis, sore mouth and hoarseness  Respiratory: negative for cough, sputum, hemoptysis, pleurisy/chest pain or wheezing  Cardiovascular: negative for chest pain, chest pressure/discomfort, palpitations, irregular heart beats, syncope, paroxysmal nocturnal dyspnea  Gastrointestinal: negative for reflux symptoms, nausea, vomiting, change in bowel habits, melena, diarrhea, constipation and abdominal pain  Genitourinary:negative for dysuria, nocturia, urinary incontinence, hesitancy and hematuria  Integument: negative for rash, skin lesion(s) and  pruritus  Hematologic/Lymphatic: negative for easy bruising, bleeding and lymphadenopathy  Musculoskeletal: Some right hip discomfort.  Neurological: negative for headaches, dizziness, seizures, paresthesia and weakness    Physical Assessment:   Constitutional: Alert, oriented, not in distress  Eyes: PERRLA, anicteric, no redness  Ears, nose, mouth, throat, and face: no palpable Lymph nodes, no mucositis, no thrush.  Respiratory: symmetrical expansion, no rales, no rhonchi, no wheezing.  Cardiovascular: S1S2, no pathologic murmur, no rub.  Gastrointestinal: soft, benign, non tender, no HSM, normal bowel sounds, no mass.  Integument: no rash, no petechiae, no ecchymosis.  Musculoskeletal: no edema, no cyanosis, no clubbing.  Neurological: intact, cranial nerves, no focal motor or sensory deficits.    Diagnostic test:     Basic Metabolic Profile   Recent Labs     05/27/23  0625   MG 2.0        CBC w/Diff    Recent Labs     05/28/23  0414   WBC 8.9   HGB 9.8*   HCT 31.0*   PLT 271    No results for input(s): "BANDS", "LYMPHS" in the last 72 hours.    Invalid input(s): "GRANS"     CHEMISTRY   Lab Results   Component Value Date    ALT 15 05/25/2023  Coagulation   Lab Results   Component Value Date/Time    INR 1.3 05/25/2023 08:26 PM    INR 1.1 05/16/2022 06:30 PM    INR 1.1 05/10/2022 02:40 AM        Current Medications:     Current Facility-Administered Medications:     polyethylene glycol (GLYCOLAX) packet 17 g, 17 g, Oral, Daily, Lamont Dowdy, MD, 17 g at 05/28/23 1450    lactated ringers infusion, , IntraVENous, Continuous, Arita Miss, MD, Last Rate: 25 mL/hr at 05/27/23 1859, New Bag at 05/27/23 1859    sodium chloride flush 0.9 % injection 5-40 mL, 5-40 mL, IntraVENous, 2 times per day, Arita Miss, MD, 10 mL at 05/28/23 2137    sodium chloride flush 0.9 % injection 5-40 mL, 5-40 mL, IntraVENous, PRN, Arita Miss, MD    0.9 % sodium chloride infusion, , IntraVENous, PRN, Arita Miss, MD     HYDROmorphone (DILAUDID) injection 0.5 mg, 0.5 mg, IntraVENous, Q3H PRN, Arita Miss, MD    ondansetron (ZOFRAN-ODT) disintegrating tablet 4 mg, 4 mg, Oral, Q8H PRN **OR** ondansetron (ZOFRAN) injection 4 mg, 4 mg, IntraVENous, Q6H PRN, Arita Miss, MD    sennosides-docusate sodium (SENOKOT-S) 8.6-50 MG tablet 1 tablet, 1 tablet, Oral, BID, Arita Miss, MD, 1 tablet at 05/28/23 2137    bisacodyl (DULCOLAX) EC tablet 5 mg, 5 mg, Oral, Daily PRN, Arita Miss, MD    polyethylene glycol (GLYCOLAX) packet 17 g, 17 g, Oral, Daily PRN, Arita Miss, MD    traMADol Janean Sark) tablet 50 mg, 50 mg, Oral, Q6H PRN, Arita Miss, MD, 50 mg at 05/28/23 1117    gabapentin (NEURONTIN) capsule 600 mg, 600 mg, Oral, 4x daily, Arita Miss, MD, 600 mg at 05/28/23 2136    senna (SENOKOT) tablet 8.6 mg, 1 tablet, Oral, Daily PRN, Arita Miss, MD    docusate sodium (COLACE) capsule 100 mg, 100 mg, Oral, BID PRN, Arita Miss, MD    ondansetron [facility]) injection 4 mg, 4 mg, IntraVENous, Q6H PRN, Arita Miss, MD    acetaminophen (TYLENOL) tablet 650 mg, 650 mg, Oral, Q6H PRN, Arita Miss, MD    melatonin tablet 3 mg, 3 mg, Oral, Nightly PRN, Arita Miss, MD    naloxone [facility]) injection 0.4 mg, 0.4 mg, IntraVENous, PRN, Arita Miss, MD    glucagon injection 1 mg, 1 mg, IntraMUSCular, PRN, Arita Miss, MD    dextrose 50 % IV solution, 20-30 mL, IntraVENous, PRN, Arita Miss, MD    acetaminophen (OFIRMEV) infusion 1,000 mg, 1,000 mg, IntraVENous, Q6H PRN, Arita Miss, MD, Stopped at 05/27/23 1925    heparin (porcine) injection 5,000 Units, 5,000 Units, SubCUTAneous, BID, Arita Miss, MD, 5,000 Units at 05/28/23 2137    ketorolac (TORADOL) injection 15 mg, 15 mg, IntraVENous, Q6H PRN, Arita Miss, MD, 15 mg at 05/29/23 0313    timolol (TIMOPTIC) 0.5 % ophthalmic solution 1 drop, 1 drop, Left Eye, Nightly, Arita Miss, MD, 1 drop at 05/28/23 2138    allopurinol (ZYLOPRIM) tablet 300 mg, 300 mg, Oral, Daily,  Arita Miss, MD, 300 mg at 05/28/23 1015    ferrous sulfate (IRON 325) tablet 325 mg, 325 mg, Oral, Daily with breakfast, Arita Miss, MD, 325 mg at 05/28/23 1015    tamsulosin (FLOMAX) capsule 0.8 mg, 0.8 mg, Oral, Daily, Arita Miss, MD, 0.8 mg at 05/28/23 1016  methocarbamol (ROBAXIN) tablet 500 mg, 500 mg, Oral, TID PRN, Arita Miss, MD, 500 mg at 05/29/23 4431    aspirin chewable tablet 81 mg, 81 mg, Oral, Daily, Arita Miss, MD, 81 mg at 05/28/23 1016    omeprazole (PRILOSEC) delayed release capsule 40 mg, 40 mg, Oral, Daily, Arita Miss, MD, 40 mg at 05/28/23 1016    therapeutic multivitamin-minerals 1 tablet, 1 tablet, Oral, Daily, Arita Miss, MD, 1 tablet at 05/28/23 1016    magnesium oxide (MAG-OX) tablet 400 mg, 400 mg, Oral, Daily, Arita Miss, MD, 400 mg at 05/28/23 1015        Catalina Antigua, MD   [facility]  Office [phone]

## 2023-05-29 NOTE — Plan of Care (Signed)
 Problem: ABCDS Injury Assessment  Goal: Absence of physical injury  05/29/2023 1224 by Hartford Poli, RN  Outcome: Progressing  05/29/2023 0007 by Floria Raveling, RN  Outcome: Progressing     Problem: Discharge Planning  Goal: Discharge to home or other facility with appropriate resources  05/29/2023 1224 by Hartford Poli, RN  Outcome: Progressing  05/29/2023 0007 by Floria Raveling, RN  Outcome: Progressing     Problem: Skin/Tissue Integrity  Goal: Skin integrity remains intact  Description: 1.  Monitor for areas of redness and/or skin breakdown  2.  Assess vascular access sites hourly  3.  Every 4-6 hours minimum:  Change oxygen saturation probe site  4.  Every 4-6 hours:  If on nasal continuous positive airway pressure, respiratory therapy assess nares and determine need for appliance change or resting period  05/29/2023 1224 by Hartford Poli, RN  Outcome: Progressing  05/29/2023 0007 by Floria Raveling, RN  Outcome: Progressing     Problem: Pain  Goal: Verbalizes/displays adequate comfort level or baseline comfort level  05/29/2023 1224 by Hartford Poli, RN  Outcome: Progressing  05/29/2023 0007 by Floria Raveling, RN  Outcome: Progressing     Problem: Confusion  Goal: Confusion, delirium, dementia, or psychosis is improved or at baseline  Description: INTERVENTIONS:  1. Assess for possible contributors to thought disturbance, including medications, impaired vision or hearing, underlying metabolic abnormalities, dehydration, psychiatric diagnoses, and notify attending LIP  2. Institute high risk fall precautions, as indicated  3. Provide frequent short contacts to provide reality reorientation, refocusing and direction  4. Decrease environmental stimuli, including noise as appropriate  5. Monitor and intervene to maintain adequate nutrition, hydration, elimination, sleep and activity  6. If unable to ensure safety without constant attention obtain sitter and review sitter guidelines with assigned personnel  7. Initiate Psychosocial CNS and  Spiritual Care consult, as indicated  05/29/2023 1224 by Hartford Poli, RN  Outcome: Progressing  Flowsheets (Taken 05/29/2023 0810)  Effect of thought disturbance (confusion, delirium, dementia, or psychosis) are managed with adequate functional status:   Assess for contributors to thought disturbance, including medications, impaired vision or hearing, underlying metabolic abnormalities, dehydration, psychiatric diagnoses, notify LIP   Institute high risk fall precautions, as indicated  05/29/2023 0007 by Floria Raveling, RN  Outcome: Progressing  Flowsheets (Taken 05/28/2023 1054 by Cecille Po, RN)  Effect of thought disturbance (confusion, delirium, dementia, or psychosis) are managed with adequate functional status: Institute high risk fall precautions, as indicated

## 2023-05-29 NOTE — Progress Notes (Signed)
 Postoperative day #2.  Stable overnight.  Plan-  Mobilize with physical therapy.  Activity as tolerated.  P.o. analgesia.  Discharge planning.

## 2023-05-29 NOTE — Care Coordination-Inpatient (Signed)
 Physician's Certification Medical Necessity Statement For  Non-Emergency Ambulance Service    Patient: Tommy Brown Age: 78 y.o. Sex: male    Date of Birth: 07-30-1945 Admit Date: 05/25/2023 PCP: Cleatis Polka, MD   MRN: [id number]  CSN: [id number]  Room: 2115/2115     Chief Complaint:   Chief Complaint   Patient presents with    Altered Mental Status    Fall      Height/Weight: Body mass index is 20.81 kg/m. Body mass index is 20.81 kg/m.   Weight:     Weight - Scale: 65.8 kg (145 lb 1 oz)  Weight Source:      Transport From:  [x] Hospital    [] SNF   [] Residence   [] Other:   Transport To:      [] Hospital    [x] SNF   [] Residence   [] Other:   Address:    [facility]    Medicare: MEDICARE PART A AND B  Transport Date: May 29, 2023 (Valid for round trips this date, or for scheduled repetitive trips for 60 days from date signed below.)  Origin: [facility]  Destination[facility]    Is the Patient's stay covered under Medicare Part A (PPS/DRG?)  []  Yes    []  No     Closest appropriate facility?  []  Yes    []  No     If no, why was the patient transported to another facility?   If hospital to hospital transfer, describe services needed at 2nd facility not available at 1st facility:   If hospice Pt, is this transport related to Pt's terminal illness? []  Yes    []  No   Describe:     SECTION II - MEDICAL NECESSITY QUESTIONNAIRE  Ambulance transportation is medically necessary only if other means of transport are contraindicated or would be potentially   Harmful to the patient. To meet this requirement, the patient must be either "bed confined" or suffer from condition such   that transport by means other than an ambulance is contraindicated by the patient's condition.The following questions   must be addressed by the healthcare professional signing below for this form to be valid:    1) Describe the MEDICAL CONDITION space (physical and/or mental) of this patient AT THE TIME OF AMBULANCE TRANSPORT       that  requires the patient to be transported in an ambulance, and why transport in an ambulance, and why transport by       other means is contraindicated by the patient's condition: 2 days post-op right hip surgery     2) Is this patient "bed confined" as defined below?  [x]  Yes    []  No           To be "bed confined" the patient must satisfy all 3 of the following criteria:           (1) unable to get up from bed without assistance; AND           (2) unable to ambulate; AND           (3) unable to sit in a chair or wheelchair.    3) Can this patient safely be transported by car or wheelchair van (I.e., may safely sit during transport, without an attendant or monitoring?)      []  Yes    [x]  No    4) In addition to completing questions 1-3 above, please check any of the following conditions that apply*:       *  Note: Supporting documentation for any boxes checked must be maintained in the patient's medical records    Patient requires ambulance transportation due to the following condition:  [] Airway control or positioning required en route  [] Altered Mental Status, Etiology:   [] Amputation of lower extremity, Site:    (BKA / AKA / Other)  [] Asphyxia or Hypoxemia, Etiology:   [] Cancer, Site:   [] Cardiac or Hemodynamic Monitoring required en route  [] Cerebrovascular disease, WITH: Cognitive Defects  [] Cerebrovascular disease, WITH: Hemiparesis / Hemiplegia  [] Cerebrovascular disease, WITH: Monoplegia of a lower limb  [] Chest wall injury  [] Contractures of extremities, Site:   [] Decubitus ulcer, Site:   [] Fracture, Site:   [] Head Injury  [] IV Fluid Management, Medications being administered:   [] PIV:   []  Yes    []  No  [] Oxygen required, patient cannot self-administer, Reason:   [] Oxygen requiring titrated therapy en route  [] Patient Safety: Danger to self or others - flight risk / monitoring  [x] Patient Safety: Risk of falling off wheelchair or stretcher while in motion  [] Restraints required:  [] Verbal      [] Physical     [] Chemical  [] Special handling en route to reduce pain  [] Special handling en route for patient positioning  [] Special handling en route for Isolation, Type:     Diagnosis:   [] Suctioning required en route  [] Torso or Trunk injury  [] Ventilator Dependent  [x] Other: 2 days post-op right hip surgery     SECTION III - SIGNATURE OF PHYSICIAN OR OTHER AUTHORIZED HEALTHCARE PROFESSIONAL  I certify that the above information is accurate based on my evaluation of this patient, and that the medical necessity provisions   of 42 CFR 410.40 (e)(1) are met, requiring that this patient be transported by ambulance.  I understand this information will be used by   the centers for Medicare and Medicaid Services (CMS) to support the determination of medical necessity for ambulance services.  I represent   that I am the beneficiary's attending physician; or an employee of the beneficiary's attending physician, or the hospital or facility where the   beneficiary is being treated and from which the beneficiary is being transported; that I have personal knowledge of the beneficiary's   condition at the time of transport; and that I meet all Medicare regulations and applicable state licensure laws for the credentialed indicated.    [] If this box is checked, I also certify that the patient is physically or mentally incapable of signing the ambulance services claim form and   that the institution with which I am affiliated has furnished care, services or assistance to the patient.  My signature below was made on behalf   of the patient pursuant to 32 ZYS063.01(S)(0).  In accordance with 42 [id number], the specific reason(s) that the patient is physically   or mentally incapable of signing the claim form is as follows.    Electronically signed by:  Mammie Lorenzo  May 29, 2023  2:45 PM    Name: _________________________Title: ______________________  Facility: [facility]  Phone Number: [phone]  Date: May 29, 2023     Please call to arrange  transportation: [phone]  [email or web address]

## 2023-05-29 NOTE — Plan of Care (Signed)
 Problem: ABCDS Injury Assessment  Goal: Absence of physical injury  05/29/2023 0007 by Floria Raveling, RN  Outcome: Progressing  05/28/2023 1143 by Cecille Po, RN  Outcome: Progressing     Problem: Discharge Planning  Goal: Discharge to home or other facility with appropriate resources  05/29/2023 0007 by Floria Raveling, RN  Outcome: Progressing  05/28/2023 1143 by Cecille Po, RN  Outcome: Progressing  Flowsheets (Taken 05/28/2023 1054 by Hartford Poli, RN)  Discharge to home or other facility with appropriate resources: Identify barriers to discharge with patient and caregiver     Problem: Skin/Tissue Integrity  Goal: Skin integrity remains intact  Description: 1.  Monitor for areas of redness and/or skin breakdown  2.  Assess vascular access sites hourly  3.  Every 4-6 hours minimum:  Change oxygen saturation probe site  4.  Every 4-6 hours:  If on nasal continuous positive airway pressure, respiratory therapy assess nares and determine need for appliance change or resting period  05/29/2023 0007 by Floria Raveling, RN  Outcome: Progressing  05/28/2023 1143 by Cecille Po, RN  Outcome: Progressing  Flowsheets (Taken 05/28/2023 1054)  Skin Integrity Remains Intact: Monitor for areas of redness and/or skin breakdown     Problem: Pain  Goal: Verbalizes/displays adequate comfort level or baseline comfort level  05/29/2023 0007 by Floria Raveling, RN  Outcome: Progressing  05/28/2023 1143 by Cecille Po, RN  Outcome: Progressing     Problem: Safety - Adult  Goal: Free from fall injury  Outcome: Progressing     Problem: Confusion  Goal: Confusion, delirium, dementia, or psychosis is improved or at baseline  Description: INTERVENTIONS:  1. Assess for possible contributors to thought disturbance, including medications, impaired vision or hearing, underlying metabolic abnormalities, dehydration, psychiatric diagnoses, and notify attending LIP  2. Institute high risk fall precautions, as indicated  3. Provide frequent short  contacts to provide reality reorientation, refocusing and direction  4. Decrease environmental stimuli, including noise as appropriate  5. Monitor and intervene to maintain adequate nutrition, hydration, elimination, sleep and activity  6. If unable to ensure safety without constant attention obtain sitter and review sitter guidelines with assigned personnel  7. Initiate Psychosocial CNS and Spiritual Care consult, as indicated  Outcome: Progressing  Flowsheets (Taken 05/28/2023 1054 by Cecille Po, RN)  Effect of thought disturbance (confusion, delirium, dementia, or psychosis) are managed with adequate functional status: Institute high risk fall precautions, as indicated

## 2023-05-29 NOTE — Care Coordination-Inpatient (Addendum)
 Transport to: [facility]  Chief complaint: AMS   Reason for transport:  2 days post-op right hip surgery   Transport set up with: [facility]   Time/Date: 05/30/2023 @ 1400  D/C Summary loaded: Yes  Nurse/CM notified: Yes  Envelope delivered: Floor notified  Insurance verified on face sheet: Yes  Auth needed: No  Auth #:

## 2023-05-29 NOTE — Discharge Summary (Signed)
 Discharge Summary      Admit Date: 05/25/2023  Discharge Date:  05/29/2023    Attending:  Lamont Dowdy, MD  Primary Care Provider:  Cleatis Polka, MD    Patient ID:    Tommy Brown  78 y.o. male  07/21/1945    Reason for Admission:  Dehydration [E86.0]  Encephalopathy [G93.40]  Primary pancreatic cancer with metastasis to other site [facility]) [C25.9]  Acute metabolic encephalopathy [G93.41]  Ground-level fall [W18.30XA]  Closed intertrochanteric fracture of hip, right, initial encounter [S72.141A]    Operative Procedures:  None      Hospital Course:       Minimally displaced acute intertrochanteric right femoral neck fracture.   S/p fall  Acute metabolic encephalopathy, improved  Leukocytosis   NSVT  Multivessel CAD status post CABG in 1995. Cardiac catheterization in 2006 revealed patent bypass grafts. 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 VT success. Bowel regime prn  PT recs SNF - ICM working on this  Cardio has seen preop, follow up outpatient recommended  Restarted low dose Bbl, as below  Repleted Mg.   Mental status, improved to baseline  Wbc, improved. Afebrile, no definite signs of infection, hold on abx at this time. Following culture data. UA / CXR neg  Oncology Dr Jodie Echevaria has seen, follow up outpatient recommended. Patient does not want treatment. Plan is comfort, palliative care. Needs genetic testing in  the office then plans for hospice.   Cont meds as ordered  Bowel regime increased, cont to monitor  Med rec done except for narcotics and DVT ppx which is required from ortho prior to dc  Family changed facility choice, now needs auth started again  Medically stable for dc otherwise       A total of 35 minutes was spent on this follow-up patient encounter for medical problems as detailed above, which included obtaining and reviewing history, reviewing labs/testing/imaging ordered at prior visit, performing exam, discussing treatment plan, ordering additional studies, reviewing medications and arranging discharge and follow up, and documenting in the record.    Physical Exam on Discharge:  BP 114/70   Pulse 69   Temp 98.1 F (36.7 C) (Temporal)   Resp 14   Ht 1.778 m (5\' 10" )   Wt 65.8 kg (145 lb 1 oz)   SpO2 98%   BMI 20.81 kg/m   Gen: Well-nourished, well-developed, in no apparent distress, upright in chair  Cardio: RRR, S1-S2  Pulm: Coarse, no wheezing, no cough  GI: Soft, nontender, nondistended, normoactive bowel sounds  Extremities: +edema, see ortho note for details  Skin: Clear, dry, intact anteriorly, see nursing notes for posterior details  Psych: Alert and awake    Labwork and Ancillary Studies    Basic Metabolic Profile  No results for input(s): "NA", "POTASSIUM", "CO2", "BUN", "GLUCOSE", "CALCIUM", "MAGNESIUM" in the last 72 hours.    Invalid input(s): "CHLORIDE", "CREAT", "PHOSPHORUS"    CBC  Recent Labs     05/28/23  0414   WBC 8.9   HCT 31.0*   MCV 93.9   MCH 29.7   MCHC 31.6   RDW 48.5*   MPV 11.4*     No results found.    Condition at discharge: stable    Discharge Medications  Current Discharge Medication List        START taking these medications    Details   metoprolol succinate (TOPROL XL) 25 MG extended release tablet Take 1 tablet by mouth daily  Qty: 30 tablet, Refills: 0      docusate sodium (COLACE, DULCOLAX) 100 MG CAPS Take 100 mg by mouth 2 times daily as needed for  Constipation  Qty: 60 capsule, Refills: 0      bisacodyl (DULCOLAX) 5 MG EC tablet Take 1 tablet by mouth daily as needed for Constipation  Qty: 30 tablet, Refills: 0      polyethylene glycol (GLYCOLAX) 17 g packet Take 1 packet by mouth daily Hold for loose stools  Qty: 30 packet, Refills: 0      sennosides-docusate sodium (SENOKOT-S) 8.6-50 MG tablet Take 2 tablets by mouth 2 times daily as needed for Constipation  Qty: 60 tablet, Refills: 0           CONTINUE these medications which have CHANGED    Details   gabapentin (NEURONTIN) 600 MG tablet Take 1 tablet by mouth 4 times daily for 30 days. Max Daily Amount: 2,400 mg  Qty: 120 tablet, Refills: 0    Associated Diagnoses: Other chronic pain           CONTINUE these medications which have NOT CHANGED    Details   aspirin 81 MG EC tablet Take 1 tablet by mouth daily      acetaminophen (TYLENOL) 325 MG tablet Take 2 tablets by mouth every 6 hours as needed      pravastatin (PRAVACHOL) 10 MG tablet Take 1 tablet by mouth nightly  Qty: 30 tablet, Refills: 3      bimatoprost (LUMIGAN) 0.01 % SOLN ophthalmic drops Place 1 drop into the right eye nightly      timolol (BETIMOL) 0.5 % ophthalmic solution Place 1 drop into the left eye at bedtime      ferrous sulfate (IRON 325) 325 (65 Fe) MG tablet Take 1 tablet by mouth daily (with breakfast)      lipase-protease-amylase (CREON) 24000-76000 units delayed release capsule Take 1 capsule by mouth 3 times daily (with meals) And with snacks      Multiple Vitamins-Minerals (THERAPEUTIC MULTIVITAMIN-MINERALS) tablet Take 1 tablet by mouth daily Centrum Silver for men      magnesium oxide (MAG-OX) 400 (240 Mg) MG tablet Take 1 tablet by mouth daily      testosterone cypionate (DEPOTESTOTERONE CYPIONATE) 200 MG/ML injection INJECT 0.5 MILLILITER BY INTRAMUSCULAR ROUTE EVERY 2 WEEKS      Omega-3 Fatty Acids-Hemp Extra (HEMP MONOPURE PO) Take by mouth      aspirin 81 MG chewable tablet Take 1 tablet by mouth daily RESUME THIS AFTER  ONE WEEK, ONLY IF NO MORE BLEEDING IN BOWEL MOVEMENTS  Qty: 30 tablet, Refills: 3      omeprazole (PRILOSEC) 40 MG delayed release capsule Take 1 capsule by mouth daily      allopurinol (ZYLOPRIM) 300 MG tablet Take 1 tablet by mouth daily      tamsulosin (FLOMAX) 0.4 MG capsule Take 2 capsules by mouth daily      temazepam (  RESTORIL) 15 MG capsule Take 1 capsule by mouth nightly as needed for Anxiety or Sleep.           STOP taking these medications       amLODIPine (NORVASC) 2.5 MG tablet Comments:   Reason for Stopping:         metoprolol tartrate (LOPRESSOR) 25 MG tablet Comments:   Reason for Stopping:                Disposition: SNF        Time spent for discharge management: >30 minutes required for completion of discharge.            Lamont Dowdy, M.D.   Division of Hospital Medicine  [facility]  Please epic chat between 7am and 5pm or page the dedicated pager after hours.

## 2023-05-29 NOTE — Progress Notes (Addendum)
 Patient: Tommy Brown     78 y.o. male  Date of Admission:  05/25/2023    Dispo: medically stable for SNF 05/29/23. ICM working on placement. Needs auth but family changed facility choice. Needs scripts for narcotics and DVT ppx per orthopedic surgery.     Assessment   Minimally displaced acute intertrochanteric right femoral neck fracture.   S/p fall  Acute metabolic encephalopathy, improved  Leukocytosis   NSVT  Multivessel CAD status post CABG in 1995. Cardiac catheterization in 2006 revealed patent bypass grafts. 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 VT today. Bowel regime  PT recs SNF - ICM working on this  Cardio has seen preop, follow up outpatient recommended  Restart low dose Bbl  Repleted Mg.   Mental status, improved  Wbc, improved. Afebrile, no definite signs of infection, hold on abx at this time. Following culture data. UA / CXR neg  Oncology Dr Jodie Echevaria has seen, follow up outpatient recommended. Patient does not want treatment. Plan is comfort, palliative care. Needs genetic testing in the office then plans for hospice.   Cont meds as ordered  Bowel regime increased, pt agrees to suppository tomorrow if no BM  Med rec done except for narcotics and DVT ppx from ortho, in prep of dc. Needs hard script gabapentin prior to leaving  Family changed facility choice, now needs auth started again    DVT ppx: heparin - Defer to ortho  surgery  Electrolytes: repleted Mg and recheck improved    I have reviewed and ordered pertinent labs and radiological studies, reconciled medications, and reviewed medical records/notes.    I have discussed with patient and nursing staff.    Patient's overall condition: stable     A total of 51 minutes was spent on this follow-up patient encounter for medical problems as outlined above, which included obtaining and reviewing history, reviewing labs/testing/imaging ordered at prior visit, performing exam, discussing treatment plan, ordering additional studies, and documenting in the record.       Current Inpatient Meds and Allergies:    Scheduled Meds:  sennosides-docusate sodium, 2 tablet, BID  metoprolol succinate, 25 mg, Daily  polyethylene glycol, 17 g, Daily  sodium chloride flush, 5-40 mL, 2 times per day  gabapentin, 600 mg, 4x daily  heparin (porcine), 5,000 Units, BID  timolol, 1 drop, Nightly  allopurinol, 300 mg, Daily  ferrous sulfate, 325 mg, Daily with breakfast  tamsulosin, 0.8 mg, Daily  aspirin, 81 mg, Daily  omeprazole, 40 mg, Daily  therapeutic multivitamin-minerals, 1 tablet, Daily  magnesium oxide, 400 mg, Daily      Drips:   lactated ringers, Last Rate: 25 mL/hr at 05/27/23 1859  sodium chloride          Allergies:  Allergies   Allergen Reactions    Codeine Itching and Swelling    Oxycodone Anaphylaxis    Amoxicillin Diarrhea    Hydroxyzine Pamoate Itching and Other (See  Comments)     Other reaction(s): Unknown    Meperidine Itching, Other (See Comments), Palpitations and Swelling     Other reaction(s): Unknown    Metoclopramide Itching, Other (See Comments) and Swelling     Other reaction(s): Other (See Comments)  Reaction unknown   Reaction unknown     Morphine Nausea And Vomiting and Other (See Comments)    Oxycodone-Acetaminophen Itching    Prochlorperazine Itching and Other (See Comments)     Other reaction(s): Unknown (comments)  Other reaction(s): Unknown  Unsure of reaction     Amoxicillin Diarrhea    Codeine Itching and Swelling    Hydroxyzine Hcl Hives    Hydroxyzine Pamoate Itching    Metoclopramide Itching and Swelling    Other Other (See Comments)     VISTAID  ANESTHESIA    Other      VISTAID ANESTHESIA    Oxycodone-Acetaminophen Itching    Prochlorperazine Itching    Meperidine Itching, Swelling and Palpitations    Morphine Nausea And Vomiting       Subjective:      Chief Complaint: Altered Mental Status and Fall      Feels fine  No BM yet        Objective:      Physical Exam:   BP 114/70   Pulse 69   Temp 98.1 F (36.7 C) (Temporal)   Resp 14   Ht 1.778 m (5\' 10" )   Wt 65.8 kg (145 lb 1 oz)   SpO2 98%   BMI 20.81 kg/m   Gen: Well-nourished, well-developed, in no apparent distress, upright in chair  Cardio: RRR, S1-S2  Pulm: Coarse, no wheezing, no cough  GI: Soft, nontender, nondistended, normoactive bowel sounds  Extremities: +edema, see ortho note for details  Skin: Clear, dry, intact anteriorly, see nursing notes for posterior details  Psych: Alert and awake        Labwork and Ancillary Studies:    Clinical laboratory tests, radiology, imaging, other test results, and medications were individually reviewed.    CBC w/Diff   Recent Labs     05/28/23  0414   WBC 8.9   RBC 3.30*   HCT 31.0*   MCV 93.9   MCH 29.7   MCHC 31.6   RDW 48.5*   MPV 11.4*      Recent Labs     05/28/23  0414   RDW 48.5*       XR PELVIS (1-2 VIEWS)  Addendum Date: 05/25/2023  Lucency overlying right greater trochanter and extending towards the lesser trochanter is felt to represent a comminuted mildly displaced acute intertrochanteric fracture. Electronically signed by: Earline Mayotte, MD 05/25/2023 11:54 PM EDT          Workstation ID: [id number]     Result Date: 05/25/2023  IMPRESSION:  No fracture. Electronically signed by: Earline Mayotte, MD 05/25/2023 10:11 PM EDT          Workstation ID: [id number]     XR FEMUR RIGHT (MIN 2 VIEWS)  Result Date: 05/25/2023  IMPRESSION:  Minimally displaced  intertrochanteric right femoral neck fracture.  Electronically signed by: Earline Mayotte, MD 05/25/2023 11:52 PM EDT          Workstation ID: [id number]     CT HEAD WO CONTRAST  Result Date: 05/25/2023  Impression: Chronic microvascular ischemic changes. Electronically signed by: Earline Mayotte, MD 05/25/2023 10:42 PM EDT          Workstation ID: [id number]  CT CERVICAL SPINE WO CONTRAST  Result Date: 05/25/2023  IMPRESSION: No acute intracranial abnormality. No acute fractures or dislocation in the cervical spine. Electronically signed by: Consuello Masse, MD 05/25/2023 10:37 PM EDT          Workstation ID: [id number]     XR CHEST PORTABLE  Result Date: 05/25/2023  IMPRESSION:  No acute disease. Electronically signed by: Earline Mayotte, MD 05/25/2023 10:12 PM EDT          Workstation ID: [id number]     XR FEMUR LEFT (MIN 2 VIEWS)  Result Date: 05/25/2023  IMPRESSION:  No fracture. Electronically signed by: Earline Mayotte, MD 05/25/2023 10:10 PM EDT          Workstation ID: [id number]         Note:  This was dictated using a computer transcription program.  Although proofread, it may contain computer transcription errors or phonetic errors.  Other human proofreading errors may exist.  Corrections may be performed at a later time.  If there is any question regarding the details of this note, please feel free to contact me directly.             Lamont Dowdy, M.D.   Division of Hospital Medicine  [facility]  Please epic chat between 7am and 5pm or page the dedicated pager after hours.

## 2023-05-29 NOTE — Progress Notes (Signed)
 PHYSICAL THERAPY TREATMENT:     Acknowledge Orders  Time  PT Charge Capture  Rehab Caseload Tracker  [employer] AM-PAC "6 Clicks" Basic Mobility Inpatient Short Form  -    Patient: Tommy Brown (78 y.o. male)  Room: 2115/2115    Primary Diagnosis: Dehydration [E86.0]  Encephalopathy [G93.40]  Primary pancreatic cancer with metastasis to other site [facility]) [C25.9]  Acute metabolic encephalopathy [G93.41]  Ground-level fall [W18.30XA]  Closed intertrochanteric fracture of hip, right, initial encounter [S72.141A]  Procedure(s) (LRB):  TROCHANTERIC NAILING RIGHT HIP (Right) 2 Days Post-Op   Length of Stay:  3 day(s)   Insurance: Payor: MEDICARE / Plan: MEDICARE PART A AND B / Product Type: *No Product type* /      Date: 05/29/2023  Time In: 0934  Time Out: 1013   Total Minutes: 39    Isolation:  No active isolations       MDRO: No active infections    Precautions: falls, pressure injury risk   Ordered weight bearing status: weight bearing as tolerated     ASSESSMENT:      Patient was seen for PT treatment.     - OOB activity with mod  assist x 1  - gait training 7 feet with rolling walker/gait belt  - continues to progress ambulation distance this date with  use of rolling walker  - requires increased time  - generalized functional muscle weakness  - decreased endurance  - active participation in therapeutic leg exercises  - motivated to increase activity  - pain well managed, pt reports pain relief with exercises and mobilizing    Recommendations:  Recommend continued physical therapy during acute stay. Recommend activity as tolerated. Recommend patient sit in chair for all meals. Continue HEP, as instructed.  Discharge Recommendations:   Skilled nursing facility (SNF): patient will benefit from further therapy at rehab facility to increase strength and endurance to return to prior level of function.  Further Equipment Recommendations for Discharge:  Recommend patient use rolling walker; patient has rolling  walker at home     PLAN:      Patient will be followed by physical therapy to address goals, per plan of care.     COMMUNICATION/EDUCATION:     Education:   Ill effects of bedrest, benefits of activity, OOB to chair for meals and mobilize as tolerated, call staff for assistance, safety, activity pacing, HEP, ankle pumps to promote improved circulation and prevent DVTs, use of ice, disposition, PT plan of care, reviewed previously taught information, all questions answered   Education provided to:    patient and patient's spouse at bedside for some of PT session; opportunity for questions and clarification was provided.   Readiness to learn/comprehension:   verbalized understanding   Barriers to learning/limitations:   none     SUBJECTIVE:     Patient agreed to PT. Has been doing exercises in bed.  Patient reports minimal pain.  Pain not rated.  Provided ice at conclusion of PT session.  Patient reports that exercises/mobilizing decreases his pain.    OBJECTIVE DATA SUMMARY:      Orders, labs, and chart reviewed on Tommy Brown. Communicated with patient's nurse, patient's spouse at bedside.     PATIENT FOUND:     Semi reclined in bed. (+) bed alarm. (+) SCDs. Spouse arrived during PT session.      COGNITIVE STATUS:     Mental status/general cognition:   normal mood, behavior, and thought processes, pleasant and  cooperative   Communication:   intact   Follows commands:   intact   Safety/Judgement:   appropriate awareness of environment and need for assistance     THERAPEUTIC ACTIVITIES; FUNCTIONAL MOBILITY AND BALANCE STATUS:    Patient received/participated in therapeutic activities (15 minutes) , therapeutic exercises (15 minutes), and gait training (9 minutes)     Bed mobility:  supine to sit : mod assist, verbal cues; requires increased time, difficulty initiating momentum     Transfers:  Sit - stand: mod assist, provided cueing for hand placement, technique, and safety, requires increased time   Stand - sit:  mod assist, provided cueing for hand placement, eccentric lowering and safety, improved eccentric control     Gait Training:  Distance: 7 feet   Gait analysis: decreased walking speed, shuffled steps, reduced stride and step length which improved with cueing, increased stance periods, downward gaze, heavy reliance of UE on walker   Assistance/assistive device: mod assist and cueing for walker management, increased step length and feedback regarding walking speed to improve gait speed outcome, and activity pacing;   rolling walker, gait belt, chair following (needed chair to return to room)     Therapeutic exercises:  10 reps each:   ankle pumps, heel slides, hip ab/duction, short arc quads (SAQ), long arc quads (LAQ)     ACTIVITY TOLERANCE:     - improving tolerance to activity during PT session  - requires increased time, rest breaks and repeated attempts with functional tasks  - motivated to increase activity  - generalized muscle weakness  - activity tolerance limited by deconditioning     FINAL LOCATION:     Seated in bed side chair, all needs within reach. Patient agrees to call for assistance. Patient set up with tray. (+) chair alarm. Provided ice pack. (+) spouse present. Patient is glad to be OOB. Nurse notified.     Thank you for this referral.  HAILEY HOFFMAN, SPT    A licensed physical therapist was present during all patient care.  I agree with the contents of this note.   Siegrune H. Dareen Piano, PT, DPT

## 2023-05-30 MED ORDER — ASPIRIN 81 MG PO TBEC
81 | ORAL | Status: AC
Start: 2023-05-30 — End: ?

## 2023-05-30 MED ORDER — METOPROLOL SUCCINATE ER 25 MG PO TB24
25 | Freq: Every day | ORAL | Status: AC
Start: 2023-05-30 — End: ?

## 2023-05-30 MED ORDER — TRAMADOL HCL 50 MG PO TABS
50 | ORAL_TABLET | Freq: Four times a day (QID) | ORAL | 0 refills | Status: AC | PRN
Start: 2023-05-30 — End: 2023-06-13

## 2023-05-30 MED FILL — METHOCARBAMOL 500 MG PO TABS: 500 MG | ORAL | Qty: 1 | Fill #0

## 2023-05-30 MED FILL — MAGNESIUM OXIDE -MG SUPPLEMENT 400 (240 MG) MG PO TABS: 400 (240 Mg) MG | ORAL | Qty: 1 | Fill #0

## 2023-05-30 MED FILL — SENNOSIDES-DOCUSATE SODIUM 8.6-50 MG PO TABS: 8.6-50 MG | ORAL | Qty: 2 | Fill #0

## 2023-05-30 MED FILL — TRAMADOL HCL 50 MG PO TABS: 50 MG | ORAL | Qty: 1 | Fill #0

## 2023-05-30 MED FILL — FEROSUL 325 (65 FE) MG PO TABS: 325 (65 Fe) MG | ORAL | Qty: 1 | Fill #0

## 2023-05-30 MED FILL — GABAPENTIN 300 MG PO CAPS: 300 MG | ORAL | Qty: 2 | Fill #0

## 2023-05-30 MED FILL — HEPARIN SODIUM (PORCINE) 5000 UNIT/ML IJ SOLN: 5000 UNIT/ML | INTRAMUSCULAR | Qty: 1 | Fill #0

## 2023-05-30 MED FILL — ALLOPURINOL 300 MG PO TABS: 300 MG | ORAL | Qty: 1 | Fill #0

## 2023-05-30 MED FILL — OMEPRAZOLE 20 MG PO CPDR: 20 MG | ORAL | Qty: 2 | Fill #0

## 2023-05-30 MED FILL — THERAPEUTIC-M PO TABS: ORAL | Qty: 1 | Fill #0

## 2023-05-30 MED FILL — SODIUM CHLORIDE FLUSH 0.9 % IV SOLN: 0.9 % | INTRAVENOUS | Qty: 10 | Fill #0

## 2023-05-30 MED FILL — METOPROLOL SUCCINATE ER 25 MG PO TB24: 25 MG | ORAL | Qty: 1 | Fill #0

## 2023-05-30 MED FILL — ASPIRIN 81 MG PO CHEW: 81 MG | ORAL | Qty: 1 | Fill #0

## 2023-05-30 MED FILL — TAMSULOSIN HCL 0.4 MG PO CAPS: 0.4 MG | ORAL | Qty: 2 | Fill #0

## 2023-05-30 MED FILL — POLYETHYLENE GLYCOL 3350 17 G PO PACK: 17 g | ORAL | Qty: 1 | Fill #0

## 2023-05-30 NOTE — Progress Notes (Signed)
 Postoperative day #3.  Stable overnight.  Vitals stable.  Pain controlled.  Plan-  Mobilize with physical therapy.  Activity as tolerated.  Okay for rehab today.  New Aquacel dressings prior to discharge.  Staples out, 2 weeks postop.  This may be done in his rehab facility.  Should follow-up with me, 1 month postop, with x-rays.  Please call [phone] for appointment.  Medication for pain per medical service.  Anticoagulation plans, if required, per medical service.

## 2023-05-30 NOTE — Discharge Instructions (Signed)
 Staples out, 2 weeks postop.  This may be done in his rehab facility.  Should follow-up with me, 1 month postop, with x-rays.  Please call [phone] for appointment.

## 2023-05-30 NOTE — Progress Notes (Signed)
 Nurse made severe attempts to call report to [facility] at [phone], [phone], [phone].nurse received no respond. This nurse leave a massage and a call back number. The nurse will send a SBAR and a call back number to the facility.

## 2023-05-30 NOTE — Progress Notes (Addendum)
 [facility]   Care Management Discharge Note    Discharge Date:  05/30/2023            IMM Given and Documented:  yes  Was LTSS or PASSR Requested: no  Confirmed LTSS or PASSR uploaded to media manager: no    Discharge Plan: [facility]     Complete if Discharging to a facility, ALF, or Group Home    Facility name and confirmed acceptance with (LINK/Phone Call- List person spoken to):  [facility]   Freedom of choice for agency received from patient or appointed decision maker:  yes    Confirmed facility bed availability and agreed upon DC Date/Time with (contact name): Tamera Stands     D/C Medications sent to [facility] pharmacy    DME ordered for Discharge (including O2 & Wound vac):  N/A     Confirmed delivery with: N/A    Dialysis?   no   Schedule (location/day/chair time/transport method):   If new, patient provided with center information/schedule (yes/no):       [facility], [facility], Indigent Meds, Prior Auth Med, or other community referral placed?       Method of Transportation and Time if scheduled:  [facility] at 1400            Patient/Family/RN aware of transport time:  yes         Signed:   Trilby Leaver , Case Manager  May 30, 2023  7:36 AM  Department Phone: [phone]

## 2023-05-30 NOTE — Telephone Encounter (Addendum)
----------  DocumentID: [id number] (AP)-------------------------------------------              [facility]                       Patient Education Report         Name: ETHANIEL, GARFIELD                  Date: 05/30/2023    MRN: [id number]                    Time: 10:25:43 AM         Patient ordered video: 'Patient Safety: Stay Safe While you are in the Hospital'    from [id number]_[email or web address]_1 via phone number: [phone] at 10:25:43 AM    Description: This program outlines some of the precautions patients can take to ensure a speedy recovery without extra complications. The video emphasizes the importance of communicating with the healthcare team.    ----------DocumentID: [id number] (AP)-------------------------------------------                       [facility]          Patient Education Report - Discharge Summary        Date: 05/30/2023   Time: 2:26:19 PM   Name: CAESON, FILIPPI   MRN: [id number]      Account Number: [id number]      Education History:        Patient ordered video: 'Patient Safety: Stay Safe While you are in the Hospital' from [id number]_[email or web address]_1 on 05/30/2023 10:25:43 AM

## 2023-05-30 NOTE — Discharge Summary (Signed)
 Addendum to discharge summary dated/3/25 by Dr. Willette Alma:    Date of admission: 05/25/2023  Date of discharge: 05/30/2023    Discharge was delayed as awaiting insurance authorization for rehab which we now have.  Patient remains afebrile.  His vital signs are stable.  He is tolerating p.o.  He is working with physical therapy.  Pain is minimal.  No problem voiding or moving his bowels.  He is medically stable for discharge.    For DVT prophylaxis, he will increase his ASA to 81 mg twice daily x 4 weeks and then resume 81 mg daily (known CAD).    Medications are as follows:    Current Discharge Medication List        START taking these medications    Details   !! metoprolol succinate (TOPROL XL) 25 MG extended release tablet Take 1 tablet by mouth daily      traMADol (ULTRAM) 50 MG tablet Take 1 tablet by mouth every 6 hours as needed for Pain for up to 14 days. Max Daily Amount: 200 mg  Qty: 25 tablet, Refills: 0    Comments: Reduce doses taken as pain becomes manageable  Associated Diagnoses: Closed intertrochanteric fracture of hip, right, initial encounter      !! metoprolol succinate (TOPROL XL) 25 MG extended release tablet Take 1 tablet by mouth daily  Qty: 30 tablet, Refills: 0      docusate sodium (COLACE, DULCOLAX) 100 MG CAPS Take 100 mg by mouth 2 times daily as needed for Constipation  Qty: 60 capsule, Refills: 0      bisacodyl (DULCOLAX) 5 MG EC tablet Take 1 tablet by mouth daily as needed for Constipation  Qty: 30 tablet, Refills: 0      polyethylene glycol (GLYCOLAX) 17 g packet Take 1 packet by mouth daily Hold for loose stools  Qty: 30 packet, Refills: 0      sennosides-docusate sodium (SENOKOT-S) 8.6-50 MG tablet Take 2 tablets by mouth 2 times daily as needed for Constipation  Qty: 60 tablet, Refills: 0       !! - Potential duplicate medications found. Please discuss with provider.        CONTINUE these medications which have CHANGED    Details   aspirin 81 MG EC tablet 1 tab BID x 4 weeks, then  resume q tab daily      gabapentin (NEURONTIN) 600 MG tablet Take 1 tablet by mouth 4 times daily for 30 days. Max Daily Amount: 2,400 mg  Qty: 120 tablet, Refills: 0    Associated Diagnoses: Other chronic pain           CONTINUE these medications which have NOT CHANGED    Details   acetaminophen (TYLENOL) 325 MG tablet Take 2 tablets by mouth every 6 hours as needed      pravastatin (PRAVACHOL) 10 MG tablet Take 1 tablet by mouth nightly  Qty: 30 tablet, Refills: 3      bimatoprost (LUMIGAN) 0.01 % SOLN ophthalmic drops Place 1 drop into the right eye nightly      timolol (BETIMOL) 0.5 % ophthalmic solution Place 1 drop into the left eye at bedtime      ferrous sulfate (IRON 325) 325 (65 Fe) MG tablet Take 1 tablet by mouth daily (with breakfast)      lipase-protease-amylase (CREON) 24000-76000 units delayed release capsule Take 1 capsule by mouth 3 times daily (with meals) And with snacks      Multiple Vitamins-Minerals (THERAPEUTIC MULTIVITAMIN-MINERALS) tablet  Take 1 tablet by mouth daily Centrum Silver for men      magnesium oxide (MAG-OX) 400 (240 Mg) MG tablet Take 1 tablet by mouth daily      testosterone cypionate (DEPOTESTOTERONE CYPIONATE) 200 MG/ML injection INJECT 0.5 MILLILITER BY INTRAMUSCULAR ROUTE EVERY 2 WEEKS      Omega-3 Fatty Acids-Hemp Extra (HEMP MONOPURE PO) Take by mouth      omeprazole (PRILOSEC) 40 MG delayed release capsule Take 1 capsule by mouth daily      allopurinol (ZYLOPRIM) 300 MG tablet Take 1 tablet by mouth daily      tamsulosin (FLOMAX) 0.4 MG capsule Take 2 capsules by mouth daily      temazepam (RESTORIL) 15 MG capsule Take 1 capsule by mouth nightly as needed for Anxiety or Sleep.           STOP taking these medications       amLODIPine (NORVASC) 2.5 MG tablet Comments:   Reason for Stopping:         metoprolol tartrate (LOPRESSOR) 25 MG tablet Comments:   Reason for Stopping:         aspirin 81 MG chewable tablet Comments:   Reason for Stopping:

## 2023-05-30 NOTE — Progress Notes (Signed)
 Brief Nutrition Note:     RD present for MST triggered assessment 2/2 wt loss 2 - 13lbs. Recorded wt hx reflect general gain over past year. Intake adequate, LOS 5. Pt now in process of d/c. No nutrition risk identified at this time.     Current Diet: ADULT DIET; Regular     Wt Readings from Last 10 Encounters:   05/27/23 65.8 kg (145 lb 1 oz)   05/05/23 65.8 kg (145 lb)   09/09/22 68 kg (150 lb)   08/20/22 55.3 kg (122 lb)   05/18/22 58.6 kg (129 lb 3 oz)   05/09/22 63.5 kg (140 lb)   02/14/22 61.7 kg (136 lb)   11/15/21 59.2 kg (130 lb 9.6 oz)   08/10/21 61.2 kg (135 lb)   05/30/21 61.2 kg (135 lb)       Tommy Chatters, MS, RDN, CNCS  05/30/23   Office: Ext [phone]

## 2023-05-30 NOTE — Plan of Care (Signed)
 Problem: ABCDS Injury Assessment  Goal: Absence of physical injury  05/30/2023 1056 by Hartford Poli, RN  Outcome: Progressing  05/29/2023 2125 by Sharyon Cable, RN  Outcome: Progressing     Problem: Discharge Planning  Goal: Discharge to home or other facility with appropriate resources  05/30/2023 1056 by Hartford Poli, RN  Outcome: Progressing  05/29/2023 2125 by Sharyon Cable, RN  Outcome: Progressing     Problem: Skin/Tissue Integrity  Goal: Skin integrity remains intact  Description: 1.  Monitor for areas of redness and/or skin breakdown  2.  Assess vascular access sites hourly  3.  Every 4-6 hours minimum:  Change oxygen saturation probe site  4.  Every 4-6 hours:  If on nasal continuous positive airway pressure, respiratory therapy assess nares and determine need for appliance change or resting period  05/30/2023 1056 by Hartford Poli, RN  Outcome: Progressing  05/29/2023 2125 by Sharyon Cable, RN  Outcome: Progressing     Problem: Pain  Goal: Verbalizes/displays adequate comfort level or baseline comfort level  05/30/2023 1056 by Hartford Poli, RN  Outcome: Progressing  05/29/2023 2125 by Sharyon Cable, RN  Outcome: Progressing     Problem: Confusion  Goal: Confusion, delirium, dementia, or psychosis is improved or at baseline  Description: INTERVENTIONS:  1. Assess for possible contributors to thought disturbance, including medications, impaired vision or hearing, underlying metabolic abnormalities, dehydration, psychiatric diagnoses, and notify attending LIP  2. Institute high risk fall precautions, as indicated  3. Provide frequent short contacts to provide reality reorientation, refocusing and direction  4. Decrease environmental stimuli, including noise as appropriate  5. Monitor and intervene to maintain adequate nutrition, hydration, elimination, sleep and activity  6. If unable to ensure safety without constant attention obtain sitter and review sitter guidelines with assigned personnel  7. Initiate Psychosocial CNS  and Spiritual Care consult, as indicated  05/30/2023 1056 by Hartford Poli, RN  Outcome: Progressing  05/29/2023 2125 by Sharyon Cable, RN  Outcome: Progressing

## 2023-05-31 LAB — CULTURE, BLOOD 1: BLOOD CULTURE RESULT: NO GROWTH

## 2023-06-02 LAB — CULTURE, BLOOD 2

## 2023-06-05 ENCOUNTER — Encounter: Attending: Student in an Organized Health Care Education/Training Program | Primary: Family Medicine

## 2023-07-10 ENCOUNTER — Encounter

## 2023-07-15 ENCOUNTER — Inpatient Hospital Stay
Admit: 2023-07-15 | Discharge: 2023-07-15 | Disposition: A | Discharge status: Home / Self Care | Payer: MEDICARE | Attending: Emergency Medicine

## 2023-07-15 ENCOUNTER — Emergency Department: Admit: 2023-07-15 | Payer: MEDICARE | Primary: Family Medicine

## 2023-07-15 DIAGNOSIS — E860 Dehydration: Secondary | ICD-10-CM

## 2023-07-15 LAB — CBC WITH AUTO DIFFERENTIAL
Basophils: 0.2 % (ref 0–3)
Eosinophils: 0.8 % (ref 0–5)
Hematocrit: 33.8 % — ABNORMAL LOW (ref 36.0–55.0)
Hemoglobin: 10.6 g/dL — ABNORMAL LOW (ref 13.0–18.0)
Immature Granulocytes %: 0.5 % (ref 0.0–3.0)
Lymphocytes: 12.4 % — ABNORMAL LOW (ref 28–48)
MCH: 28.1 pg (ref 23.0–34.6)
MCHC: 31.4 g/dL (ref 30.0–36.0)
MCV: 89.7 fL (ref 80.0–98.0)
MPV: 10.1 fL — ABNORMAL HIGH (ref 6.0–10.0)
Monocytes: 4.2 % (ref 1–13)
Neutrophils Segmented: 81.9 % — ABNORMAL HIGH (ref 34–64)
Nucleated RBCs: 0 (ref 0–0)
Platelets: 251 10*3/uL (ref 140–450)
RBC: 3.77 M/uL — ABNORMAL LOW (ref 3.80–5.70)
RDW: 55.8 — ABNORMAL HIGH (ref 35.1–43.9)
WBC: 13.9 10*3/uL — ABNORMAL HIGH (ref 4.0–11.0)

## 2023-07-15 LAB — EKG 12-LEAD
Atrial Rate: 111 {beats}/min
Calculated P Axis: 5 degrees
Calculated R Axis: -18 degrees
Calculated T Axis: 84 degrees
P-R Interval: 176 ms
Q-T Interval: 296 ms
QRS Duration: 76 ms
QTC Calculation (Bezet): 402 ms
Ventricular Rate: 111 {beats}/min

## 2023-07-15 LAB — POCT URINALYSIS DIPSTICK
Bilirubin, Urine: NEGATIVE
Blood, Urine: NEGATIVE
Glucose, Ur: NEGATIVE mg/dL
Ketones, Urine: 15 mg/dL — AB
Leukocyte Esterase, Urine: NEGATIVE
Nitrite, Urine: NEGATIVE
Protein, Urine: NEGATIVE mg/dL
Specific Gravity, UA: 1.015 (ref 1.005–1.030)
Urobilinogen, Urine: 0.2 EU/dl (ref 0.0–1.0)
pH, Urine: 6 (ref 5–9)

## 2023-07-15 LAB — COMPREHENSIVE METABOLIC PANEL
ALT: 10 U/L (ref 10–49)
AST: 25 U/L (ref 0.0–33.9)
Albumin: 1.5 g/dL — ABNORMAL LOW (ref 3.4–5.0)
Alkaline Phosphatase: 245 U/L — ABNORMAL HIGH (ref 46–116)
Anion Gap: 11 mmol/L (ref 5–15)
BUN: 11 mg/dL (ref 9–23)
CO2: 21 meq/L (ref 20–31)
Calcium: 7.7 mg/dL — ABNORMAL LOW (ref 8.7–10.4)
Chloride: 102 meq/L (ref 98–107)
Creatinine: 1.03 mg/dL (ref 0.70–1.30)
GFR African American: >60
GFR Non-African American: >60
Glucose: 69 mg/dL — ABNORMAL LOW (ref 74–106)
Potassium: 4.9 meq/L (ref 3.5–5.1)
Sodium: 134 meq/L — ABNORMAL LOW (ref 136–145)
Total Bilirubin: 0.7 mg/dL (ref 0.30–1.20)
Total Protein: 5.3 g/dL — ABNORMAL LOW (ref 5.7–8.2)

## 2023-07-15 LAB — LIPASE: Lipase: 13 U/L (ref 12–53)

## 2023-07-15 LAB — AMMONIA: Ammonia: 22 umol/L (ref 11.2–31.7)

## 2023-07-15 LAB — PROCALCITONIN: Procalcitonin: 0.25 ng/mL (ref 0.00–0.50)

## 2023-07-15 LAB — LACTATE, SEPSIS: Lactate: 1.2 mmol/L (ref 0.5–2.2)

## 2023-07-15 MED ORDER — SODIUM CHLORIDE 0.9 % IV SOLN
0.9 | Freq: Once | INTRAVENOUS | Status: AC
Start: 2023-07-15 — End: 2023-07-15
  Administered 2023-07-15: 16:00:00 30 mL/kg/h via INTRAVENOUS

## 2023-07-15 MED ORDER — IOPAMIDOL 61 % IV SOLN
61 | Freq: Once | INTRAVENOUS | Status: AC | PRN
Start: 2023-07-15 — End: 2023-07-15
  Administered 2023-07-15: 18:00:00 85 mL via INTRAVENOUS

## 2023-07-15 MED ORDER — CEFEPIME HCL 2 G IV SOLR
2 | Freq: Once | INTRAVENOUS | Status: AC
Start: 2023-07-15 — End: 2023-07-15
  Administered 2023-07-15: 16:00:00 2000 mg via INTRAVENOUS

## 2023-07-15 MED ORDER — VANCOMYCIN (VANCOCIN) 1000 MG IN SODIUM CHLORIDE 0.9% 250 ML IVPB
Freq: Once | Status: AC
Start: 2023-07-15 — End: 2023-07-15
  Administered 2023-07-15: 16:00:00 1000 mg via INTRAVENOUS

## 2023-07-15 MED ORDER — CEFEPIME HCL 2 G IV SOLR
2 | Freq: Once | INTRAVENOUS | Status: DC
Start: 2023-07-15 — End: 2023-07-15

## 2023-07-15 MED FILL — ISOVUE-300 61 % IV SOLN: 61 % | INTRAVENOUS | Qty: 85 | Fill #0

## 2023-07-15 MED FILL — VANCOMYCIN (VANCOCIN) 1000 MG IN SODIUM CHLORIDE 0.9% 250 ML IVPB: Qty: 250 | Fill #0

## 2023-07-15 MED FILL — CEFEPIME HCL 2 G IV SOLR: 2 g | INTRAVENOUS | Qty: 2 | Fill #0

## 2023-07-15 NOTE — ED Provider Notes (Cosign Needed)
 [facility]  Emergency Department Treatment Report    Patient: Tommy Brown Age: 78 y.o. Sex: male    Date of Birth: 28-Jun-1945 Admit Date: 07/15/2023 PCP: Loreta Rome, MD   MRN: [id number]  CSN: [id number]  ATTENDING: Riva Chester, MD    Room: [id number] Time Dictated: 4:17 PM APP: Celestino Cole, PA-C     Chief Complaint   Chief Complaint   Patient presents with    Fever    Generalized Body Aches       History of Present Illness   78 y.o. male With a history of chronic kidney disease, coronary artery disease, hypertension, pancreatic cancer presenting to the emergency department for evaluation of nausea, poor appetite and fatigue.  Patient is here with his wife who helps to provide much of the history.  She reports that for the past couple of days he has been sleeping much of the day, not eating or drinking very much, he has reported some nausea, no vomiting.  Reports that he is always cold, no measured fevers but felt warm to the touch prompting concern for possible fever.  They do report that he was recently diagnosed with a recurrence of his pancreatic cancer for which he had a Whipple 13 years ago.  Was supposed to have CT-guided liver biopsy yesterday but missed the appointment due to how poorly he has been feeling.  PT OT was at the house today and recommended he come in for evaluation.    He denies irritative voiding symptoms, cough, shortness of breath, chest pain, does have some diffuse abdominal pain.  Review of Systems   See HPI    Past Medical/Surgical History     Past Medical History:   Diagnosis Date    Arthritis     Benign prostate hyperplasia     Bladder infection     Chronic kidney disease     Coronary arteriosclerosis     Coronary atherosclerosis of artery bypass graft     Decreased testosterone level     Diverticular disease     Essential hypertension     Gout     History of acute renal failure     History of anemia     History of malignant neoplasm of pancreas      Hx of CABG     Kidney stone     Kidney stones     Neuropathy     NECK, SHOULDERS    Pancreatic cancer (HCC)     Sepsis (HCC)      Past Surgical History:   Procedure Laterality Date    CABG, ARTERY-VEIN, FOUR  1995    CAPSULE ENDOSCOPY N/A 05/23/2022    BOWEL SMALL CAPSULE ENDOSCOPY performed by Norman Beckmann, MD at [facility] ENDOSCOPY    CARDIAC CATHETERIZATION  2006    CERVICAL FUSION      COLONOSCOPY N/A 07/10/2020    DIAGNOSTIC COLONOSCOPY polypectomy w/hot snare, cold snare , Clip x 1  performed by Edra Govern, MD at [facility] ENDOSCOPY    COLONOSCOPY      COLONOSCOPY N/A 05/17/2022    COLONOSCOPY DIAGNOSTIC aborted at 25cm performed by Edra Govern, MD at [facility] ENDOSCOPY    CORONARY ARTERY BYPASS GRAFT  1995    FEMUR SURGERY Right 05/27/2023    TROCHANTERIC NAILING RIGHT HIP performed by Spence Dux, MD at [facility] MAIN OR    LUMBAR DISCECTOMY      OTHER SURGICAL HISTORY  01/01/2011  whipple procedure done for pancreatic cancer    UPPER GASTROINTESTINAL ENDOSCOPY N/A 05/17/2022    ESOPHAGOGASTRODUODENOSCOPY DIAGNOSTIC ONLY performed by Edra Govern, MD at [facility] ENDOSCOPY    UROLOGICAL SURGERY  2012    Percutaneous extraction of a kidney stone w/ fragmentation procedure        Social History     Social History     Socioeconomic History    Marital status: Married   Tobacco Use    Smoking status: Never     Passive exposure: Never    Smokeless tobacco: Never   Vaping Use    Vaping status: Never Used   Substance and Sexual Activity    Alcohol use: Never    Drug use: Never    Sexual activity: Defer   Social History Narrative    ** Merged History Encounter **         ** Merged History Encounter **        Social Drivers of Health     Food Insecurity: No Food Insecurity (05/28/2023)    Hunger Vital Sign     Worried About Running Out of Food in the Last Year: Never true     Ran Out of Food in the Last Year: Never true   Transportation Needs: No Transportation Needs (05/28/2023)    PRAPARE - Product/process development scientist (Medical): No     Lack of Transportation (Non-Medical): No   Housing Stability: Low Risk  (05/28/2023)    Housing Stability Vital Sign     Unable to Pay for Housing in the Last Year: No     Number of Times Moved in the Last Year: 0     Homeless in the Last Year: No       Family History     Family History   Problem Relation Age of Onset    Emphysema Brother     Alcohol Abuse Father     Heart Disease Brother        Current Medications     Discharge Medication List as of 07/15/2023  3:11 PM        CONTINUE these medications which have NOT CHANGED    Details   aspirin  81 MG EC tablet 1 tab BID x 4 weeks, then resume q tab dailyDC to SNF      !! metoprolol  succinate (TOPROL  XL) 25 MG extended release tablet Take 1 tablet by mouth dailyDC to SNF      gabapentin  (NEURONTIN ) 600 MG tablet Take 1 tablet by mouth 4 times daily for 30 days. Max Daily Amount: 2,400 mg, Disp-120 tablet, R-0Print      !! metoprolol  succinate (TOPROL  XL) 25 MG extended release tablet Take 1 tablet by mouth daily, Disp-30 tablet, R-0Normal      docusate sodium  (COLACE, DULCOLAX) 100 MG CAPS Take 100 mg by mouth 2 times daily as needed for Constipation, Disp-60 capsule, R-0Normal      bisacodyl  (DULCOLAX) 5 MG EC tablet Take 1 tablet by mouth daily as needed for Constipation, Disp-30 tablet, R-0Normal      sennosides-docusate sodium  (SENOKOT-S) 8.6-50 MG tablet Take 2 tablets by mouth 2 times daily as needed for Constipation, Disp-60 tablet, R-0Normal      pravastatin  (PRAVACHOL ) 10 MG tablet Take 1 tablet by mouth nightly, Disp-30 tablet, R-3Normal      bimatoprost (LUMIGAN) 0.01 % SOLN ophthalmic drops Place 1 drop into the right eye nightlyHistorical Med      timolol  (  BETIMOL ) 0.5 % ophthalmic solution Place 1 drop into the left eye at bedtimeHistorical Med      ferrous sulfate  (IRON  325) 325 (65 Fe) MG tablet Take 1 tablet by mouth daily (with breakfast)Historical Med      lipase -protease -amylase  (CREON ) 24000-76000 units delayed  release capsule Take 1 capsule by mouth 3 times daily (with meals) And with snacks, Oral, 3 TIMES DAILY WITH MEALS, Historical Med      Multiple Vitamins-Minerals (THERAPEUTIC MULTIVITAMIN-MINERALS) tablet Take 1 tablet by mouth daily Centrum Silver for menHistorical Med      magnesium  oxide (MAG-OX) 400 (240 Mg) MG tablet Take 1 tablet by mouth dailyHistorical Med      testosterone cypionate (DEPOTESTOTERONE CYPIONATE) 200 MG/ML injection INJECT 0.5 MILLILITER BY INTRAMUSCULAR ROUTE EVERY 2 WEEKSHistorical Med      Omega-3 Fatty Acids-Hemp Extra (HEMP MONOPURE PO) Take by mouthHistorical Med      omeprazole  (PRILOSEC ) 40 MG delayed release capsule Take 1 capsule by mouth dailyHistorical Med      acetaminophen  (TYLENOL ) 325 MG tablet Take 2 tablets by mouth every 6 hours as neededHistorical Med      allopurinol  (ZYLOPRIM ) 300 MG tablet Take 1 tablet by mouth dailyHistorical Med      tamsulosin  (FLOMAX ) 0.4 MG capsule Take 2 capsules by mouth dailyHistorical Med      temazepam  (RESTORIL ) 15 MG capsule Take 1 capsule by mouth nightly as needed for Anxiety or Sleep.Historical Med       !! - Potential duplicate medications found. Please discuss with provider.            Allergies   Codeine, Oxycodone, Amoxicillin, Hydroxyzine pamoate, Meperidine, Metoclopramide , Morphine , Oxycodone-acetaminophen , Prochlorperazine , Amoxicillin, Codeine, Hydroxyzine hcl, Hydroxyzine pamoate, Metoclopramide , Other/food, Other/food, Oxycodone-acetaminophen , Prochlorperazine , Meperidine, and Morphine       Physical Exam     ED Triage Vitals [07/15/23 0916]   BP Systolic BP Percentile Diastolic BP Percentile Temp Temp Source Pulse Respirations SpO2   125/73 -- -- 99 F (37.2 C) Oral 97 18 94 %      Height Weight - Scale         1.778 m (5\' 10" ) 45.4 kg (100 lb)               Constitutional: Thin, chronically ill appearing elderly male seated recumbent in stretcher, looks tired  HEENT: Conjunctiva clear.  Mucous membranes moist,  non-erythematous. Surface of the pharynx, palate, and tongue are pink, moist and without lesions.  Neck: supple, non tender, symmetrical, no masses or lymphadenopathy   Respiratory: lungs clear to auscultation, nonlabored respirations. No tachypnea or accessory muscle use.  Cardiovascular: heart regular rate and rhythm without murmur rubs or gallops.     Gastrointestinal:  Soft, diffusely tender with palpation no rebound or guarding, non-distended, normoactive bowel sounds  Musculoskeletal: Calves soft and non-tender. No peripheral edema or significant varicosities.  Pulses:  radial and DP pulses 2+ and equal bilaterally.   Integumentary: warm and dry, no jaundice, no rashes or lesions  Neurologic: alert and oriented, no focal weakness.     Impression and Management Plan   78 year old male presenting ot the ED for evaluation of generalized weakness, malaise, nausea, abdominal pain, concern for possible fever in recent days. On exam, he is cachectic, chronically ill appearing, abdomen is soft with significant tenderness. Will obtain labs, chest x-ray, urinalysis, CT abd/pelv for further evaluation, treat symptomatically, hydrate, monitor.    DDx: Including but not limited to urinary tract infection, intraabdominal infection, pneumonia, malignancy  Diagnostic Studies     Results for orders placed or performed during the hospital encounter of 07/15/23   Culture, Blood 1    Specimen: Blood    Narrative    Right AC  Right AC   Culture, Blood 2    Specimen: Blood    Narrative    Source:->Blood  LEFT AC  LEFT AC   XR CHEST (2 VW)    Narrative    History: Sepsis  Comparison with 05/25/2023    2 views of the chest, 2 exposures    FINDINGS:  New small bilateral pleural effusions, right larger than left. Linear opacity in  the right lower lung in keeping with subsegmental atelectasis. Sternotomy for  CABG. Normal heart size. No pneumothorax.      Impression    IMPRESSION:  1. Small bilateral pleural effusions, right larger than  left, and right lower  lung atelectasis.    Electronically signed by: Nathanel Bal, MD 07/15/2023 11:18 AM EDT            Workstation ID: [id number]     CT ABDOMEN PELVIS W IV CONTRAST Additional Contrast? None    Narrative    INDICATION: generalized abd pain, sepsis; INDICATION: generalized abd pain,  sepsis;    TECHNIQUE: 5 mm axial images were acquired through the abdomen with intravenous  contrast administration with coronal and sagittal reconstruction.    COMPARISON: 05/07/2023.    FINDINGS:  Lung bases: Moderate sized bilateral pleural effusions. Compressive atelectasis  noted in both lower lobes.    Liver: Multiple ring lesions identified in both the right and left hepatic lobe  consistent with metastatic disease. This finding is not noted on the prior CT  exam. They are identified on the prior MRI examination of 05/16/2023. The largest  lesion measures approximately 3 cm in diameter.  Spleen unremarkable  Pancreas: Status post Whipple. Significant reduction in the size of the  previously identified pancreatic mass currently measuring 2.7 x 2.6 cm.    Adrenals: Unremarkable  Kidneys: Bilateral renal cysts.    Lower abdomen and pelvis: Mucosal thickening noted within the descending and  sigmoid colon. No evidence of an obstruction. Moderate ascitic fluid noted in  the peritoneal cavity. Diverticulosis of the colon without evidence of  diverticulitis.        Impression    IMPRESSION:    1. Multiple ring lesions noted within the liver consistent with metastatic  disease  2. Reduction in the size of the previously identified mass in the body of the  pancreas  3. Mild ascites noted.  4. Bowel wall thickening noted primarily involving the descending and sigmoid  colon suspicious for colitis.  5. Moderate-sized bilateral pleural effusions with compressive atelectasis of  both lower lobes          Electronically signed by: Titus Formosa, MD 07/15/2023 2:26 PM EDT            Workstation ID: [id number]     CBC with  Auto Differential   Result Value Ref Range    WBC 13.9 (H) 4.0 - 11.0 1000/mm3    RBC 3.77 (L) 3.80 - 5.70 M/uL    Hemoglobin 10.6 (L) 13.0 - 18.0 gm/dl    Hematocrit 41.3 (L) 36.0 - 55.0 %    MCV 89.7 80.0 - 98.0 fL    MCH 28.1 23.0 - 34.6 pg    MCHC 31.4 30.0 - 36.0 gm/dl    Platelets 244 010 - 450 1000/mm3    MPV  10.1 (H) 6.0 - 10.0 fL    RDW 55.8 (H) 35.1 - 43.9      Nucleated RBCs 0 0 - 0      Immature Granulocytes % 0.5 0.0 - 3.0 %    Neutrophils Segmented 81.9 (H) 34 - 64 %    Lymphocytes 12.4 (L) 28 - 48 %    Monocytes 4.2 1 - 13 %    Eosinophils 0.8 0 - 5 %    Basophils 0.2 0 - 3 %   CMP   Result Value Ref Range    Potassium 4.9 3.5 - 5.1 mEq/L    Chloride 102 98 - 107 mEq/L    Sodium 134 (L) 136 - 145 mEq/L    CO2 21 20 - 31 mEq/L    Glucose 69 (L) 74 - 106 mg/dl    BUN 11 9 - 23 mg/dl    Creatinine 6.57 8.46 - 1.30 mg/dl    GFR African American >60      GFR Non-African American >60      Calcium  7.7 (L) 8.7 - 10.4 mg/dl    Anion Gap 11 5 - 15 mmol/L    AST 25.0 0.0 - 33.9 U/L    ALT 10 10 - 49 U/L    Alkaline Phosphatase 245 (H) 46 - 116 U/L    Total Bilirubin 0.70 0.30 - 1.20 mg/dl    Total Protein 5.3 (L) 5.7 - 8.2 gm/dl    Albumin 1.5 (L) 3.4 - 5.0 gm/dl   Lipase    Result Value Ref Range    Lipase  13 12 - 53 U/L   Lactate, Sepsis   Result Value Ref Range    Lactate 1.2 0.5 - 2.2 mmol/L   Procalcitonin   Result Value Ref Range    Procalcitonin 0.25 0.00 - 0.50 ng/ml   Ammonia   Result Value Ref Range    Ammonia 22.0 11.2 - 31.7 umol/L   POCT Urinalysis no Micro   Result Value Ref Range    Glucose, Ur Negative NEGATIVE,Negative mg/dl    Bilirubin, Urine Negative NEGATIVE,Negative      Ketones, Urine 15 (A) NEGATIVE,Negative mg/dl    Specific Gravity, UA 1.015 1.005 - 1.030      Blood, Urine Negative NEGATIVE,Negative      pH, Urine 6.0 5 - 9      Protein, Urine Negative NEGATIVE,Negative mg/dl    Urobilinogen, Urine 0.2 0.0 - 1.0 EU/dl    Nitrite, Urine Negative NEGATIVE,Negative      Leukocyte Esterase,  Urine Negative NEGATIVE,Negative      Color, UA Dark yellow      Clarity, UA Cloudy     EKG 12 Lead   Result Value Ref Range    Ventricular Rate 111 BPM    Atrial Rate 111 BPM    P-R Interval 176 ms    QRS Duration 76 ms    Q-T Interval 296 ms    QTC Calculation (Bezet) 402 ms    Calculated P Axis 5 degrees    Calculated R Axis -18 degrees    Calculated T Axis 84 degrees    DIAGNOSIS, 93000       Sinus tachycardia  Low voltage QRS  Porr R wave progression, possible undated MI or due to body habitus  Nonspecific T wave abnormality  Abnormal ECG  Confirmed by Alric Jensen ([id number]) on 07/15/2023 2:27:05 PM           ECG: Interpreted by myself and Dorethea Ganong  S, MD. My interpretation is sinus tachycardia, ventricular rate 111, PR interval 176, QRS 76, QTc 402.  No acute ischemic changes noted.    Cardiac monitor: Ordered for malaise, tachycardia. My interpretation is sinus tachycardia rate of 105    Imaging studies independently interpreted by myself and Riva Chester, MD. My interpretation is chest x-ray with small bilateral pleural effusions  ED Course/Medical Decision Making   Patient's white blood cell count is somewhat elevated 13.9 with left shift no bandemia.  H&H 10.6 and 33.8.  Platelets 251.  Electrolytes grossly normal, renal function is normal, creatinine 1.03 with a BUN of 11.  Lactate within normal limits at 1.2, procalcitonin within normal limits.  Chest x-ray with bilateral pleural effusions, urinalysis with no evidence to suggest infection negative nitrites negative leukocyte esterase.  CT scan of the abdomen and pelvis showing multiple ring lesions noted within the liver consistent with metastatic disease, redemonstrated pancreatic mass with some ascites.  Also some bowel wall thickening and moderate-sized bilateral pleural effusions.    No obvious nidus for infection.  The patient had been given initial doses of vancomycin  and cefepime but participated in extensive discussion with the patient and  his wife at the bedside and ultimately the patient does not desire hospitalization, plans to proceed with hospice care.  He would like to be discharged home and we feel that this is reasonable.  We discussed return precautions, patient and his wife verbalized understanding and the patient was discharged home.        RECORDS REVIEWED:  I reviewed the patient's previous records here at [facility] and available outside facilities and note that patient was admitted to our facility 05/25/2023 through 05/30/2023, had minimally displaced intertrochanteric right femur fracture.  Had some encephalopathy at that time.  Underwent trochanteric nail with Dr. Cecilio Coffer 05/27/23, seen by oncology Dr. Drenda Gentle during that hospital stay.    EXTERNAL RESULTS REVIEWED: CT abdomen and pelvis dated 05/07/2023:    IMPRESSION:     1.  Mass in the body of the pancreas. This is not identified on the patient's  prior examination of 05/18/2022. This is consistent with recurrent tumor.  Correlation with MRI or PET/CT examination is recommended.  2.  Other findings remain unchanged.     Electronically signed by: Titus Formosa, MD 05/07/2023 4:40 PM EDT            Workstation ID: [id number]    INDEPENDENT HISTORIAN:  History and/or plan development assisted by: Patient's wife    Severe exacerbation or progression of chronic illness: Pancreatic cancer      Threat to body function without evaluation and management: Gastrointestinal, infectious      SOCIAL DETERMINANTS  impacting Evaluation and Management: Provider availability, stress, health literacy      Comorbidities impacting Evaluation and Management: Pancreatic cancer, coronary disease    Medications   0.9 % sodium chloride  infusion (0 mL/kg/hr  45.4 kg IntraVENous Stopped 07/15/23 1525)   vancomycin  (VANCOCIN ) 1000 mg in sodium chloride  0.9% 250 mL IVPB (0 mg IntraVENous Stopped 07/15/23 1237)   ceFEPIme (MAXIPIME) 2,000 mg in sterile water 10 mL IV syringe (2,000 mg IntraVENous Given 07/15/23 1135)   iopamidol   (ISOVUE -300) 61 % injection 85 mL (85 mLs IntraVENous Given 07/15/23 1402)     Final Diagnosis     1. Dehydration    2. 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    My signature above authenticates this document and my orders, the final diagnosis (es), discharge prescription (s), and instructions in the Epic record. If you have any questions please contact [phone].     Nursing notes have been reviewed by the physician/ advanced practice clinician.    Dragon medical dictation software was used for portions of this report. Unintended voice recognition errors may occur.                           Celestino Cole, PA-C  07/15/23 1622

## 2023-07-15 NOTE — Discharge Instructions (Signed)
 Call [facility] to set up hospice.  Try drinking some chicken broth or using the liquid IV.  Return to the ER for worsening symptoms or new concerns

## 2023-07-15 NOTE — ED Notes (Signed)
 Discharge instructions reviewed with patient     Luberta Ruse, RN  07/15/23 1525

## 2023-07-15 NOTE — Care Coordination-Inpatient (Signed)
 [facility]   Care Management Update    Patient Name: Tommy Brown                       Unit/Room: HY86/VH84  Admitting Diagnosis: No admission diagnoses are documented for this encounter.   Length of stay: 0        Discharge Plan Updates:     [facility] PT OT SN. ROC orders placed.        Signed:   Mont Antis, RN , Case Manager  Jul 15, 2023  1:08 PM  Department Phone: [phone]

## 2023-07-15 NOTE — ED Notes (Signed)
 Pt hooked up to monitor and provided with a pillow and additional warm blankets. No further needs at this time     Luberta Ruse, [place]  07/15/23 1610

## 2023-07-15 NOTE — ED Triage Notes (Signed)
 Pt arrived via EMS c/o generalized weakness and fever x a few days. Pt's family thinks he has UTI.

## 2023-07-16 LAB — CULTURE, URINE: Culture Result: NO GROWTH

## 2023-07-17 ENCOUNTER — Inpatient Hospital Stay: Payer: MEDICARE | Attending: Specialist | Primary: Family Medicine

## 2023-07-21 LAB — CULTURE, BLOOD 2: BLOOD CULTURE RESULT: NO GROWTH

## 2023-07-21 LAB — CULTURE, BLOOD 1: BLOOD CULTURE RESULT: NO GROWTH

## 2023-07-27 DEATH — deceased
